# Patient Record
Sex: Female | Born: 1940 | Race: White | Hispanic: No | Smoking: Never smoker
Health system: Southern US, Community
[De-identification: ages and names within clinical notes are randomized; demographics above are authoritative.]

## PROBLEM LIST (undated history)

## (undated) DIAGNOSIS — M549 Dorsalgia, unspecified: Secondary | ICD-10-CM

## (undated) DIAGNOSIS — J302 Other seasonal allergic rhinitis: Secondary | ICD-10-CM

## (undated) DIAGNOSIS — R110 Nausea: Secondary | ICD-10-CM

## (undated) DIAGNOSIS — J189 Pneumonia, unspecified organism: Secondary | ICD-10-CM

## (undated) DIAGNOSIS — R0602 Shortness of breath: Secondary | ICD-10-CM

## (undated) DIAGNOSIS — F32A Depression, unspecified: Secondary | ICD-10-CM

## (undated) DIAGNOSIS — H269 Unspecified cataract: Secondary | ICD-10-CM

## (undated) DIAGNOSIS — M62838 Other muscle spasm: Secondary | ICD-10-CM

## (undated) DIAGNOSIS — I6529 Occlusion and stenosis of unspecified carotid artery: Secondary | ICD-10-CM

## (undated) DIAGNOSIS — R42 Dizziness and giddiness: Secondary | ICD-10-CM

## (undated) DIAGNOSIS — I2510 Atherosclerotic heart disease of native coronary artery without angina pectoris: Secondary | ICD-10-CM

## (undated) DIAGNOSIS — R3915 Urgency of urination: Secondary | ICD-10-CM

## (undated) DIAGNOSIS — R51 Headache: Secondary | ICD-10-CM

## (undated) DIAGNOSIS — G2581 Restless legs syndrome: Secondary | ICD-10-CM

## (undated) DIAGNOSIS — K219 Gastro-esophageal reflux disease without esophagitis: Secondary | ICD-10-CM

## (undated) DIAGNOSIS — N390 Urinary tract infection, site not specified: Secondary | ICD-10-CM

## (undated) DIAGNOSIS — E119 Type 2 diabetes mellitus without complications: Secondary | ICD-10-CM

## (undated) DIAGNOSIS — F329 Major depressive disorder, single episode, unspecified: Secondary | ICD-10-CM

## (undated) DIAGNOSIS — I639 Cerebral infarction, unspecified: Secondary | ICD-10-CM

## (undated) DIAGNOSIS — N179 Acute kidney failure, unspecified: Secondary | ICD-10-CM

## (undated) DIAGNOSIS — I739 Peripheral vascular disease, unspecified: Secondary | ICD-10-CM

## (undated) DIAGNOSIS — I6521 Occlusion and stenosis of right carotid artery: Secondary | ICD-10-CM

## (undated) DIAGNOSIS — I10 Essential (primary) hypertension: Secondary | ICD-10-CM

## (undated) DIAGNOSIS — Z48812 Encounter for surgical aftercare following surgery on the circulatory system: Secondary | ICD-10-CM

## (undated) DIAGNOSIS — F419 Anxiety disorder, unspecified: Secondary | ICD-10-CM

## (undated) DIAGNOSIS — E785 Hyperlipidemia, unspecified: Secondary | ICD-10-CM

## (undated) DIAGNOSIS — E782 Mixed hyperlipidemia: Secondary | ICD-10-CM

## (undated) HISTORY — DX: Urinary tract infection, site not specified: N39.0

## (undated) HISTORY — DX: Atherosclerotic heart disease of native coronary artery without angina pectoris: I25.10

## (undated) HISTORY — DX: Occlusion and stenosis of unspecified carotid artery: I65.29

## (undated) HISTORY — DX: Essential (primary) hypertension: I10

## (undated) HISTORY — PX: TRIGGER FINGER RELEASE: SHX641

## (undated) HISTORY — DX: Occlusion and stenosis of right carotid artery: I65.21

## (undated) HISTORY — DX: Encounter for surgical aftercare following surgery on the circulatory system: Z48.812

## (undated) HISTORY — DX: Acute kidney failure, unspecified: N17.9

## (undated) HISTORY — PX: TONSILLECTOMY: SUR1361

## (undated) HISTORY — PX: CORNEAL TRANSPLANT: SHX108

## (undated) HISTORY — DX: Type 2 diabetes mellitus without complications: E11.9

## (undated) HISTORY — DX: Mixed hyperlipidemia: E78.2

## (undated) HISTORY — DX: Peripheral vascular disease, unspecified: I73.9

## (undated) NOTE — *Deleted (*Deleted)
HEMATOLOGY/ONCOLOGY CONSULTATION NOTE  Date of Service: 10/20/2019  Patient Care Team: Henrine Screws, MD as PCP - General (Family Medicine) Corky Crafts, MD as Consulting Physician (Cardiology) Elinor Parkinson, [place] as Consulting Physician (Podiatry) Maeola Harman, MD as Consulting Physician (Neurosurgery) Mckinley Jewel, MD as Consulting Physician (Ophthalmology) Pa, [facility] (Family Medicine)  CHIEF COMPLAINTS/PURPOSE OF CONSULTATION:  Leukopenia, Neutropenia, Mild Thrombocytopenia  HISTORY OF PRESENTING ILLNESS:   Andrea Kirk is a wonderful 71 y.o. female who has been referred to [place] by Dr. Abigail Kirk for evaluation and management of leukopenia, neutropenia, and mild thrombocytopenia. The pt reports that she is doing well overall.   The pt reports that she had a fall on Memorial Day. Pt sustained several injuries from the fall. She has had increased fatigue and sluggishness since then. Pt has been taking Hydrocodone as prescribed and Tylenol as needed for pain management. Pt is also on Lasix.   Since May pt has been taken of off Metformin and Atorvostatin and placed on Rosuvastatin. She denies any recent infections. Pt takes Vitamin B12 and Folic acid daily. Pt has received her COVID19 and flu vaccines.  Most recent lab results (09/15/2019) of CBC w/diff is as follows: all values are WNL except for WBC at 3.3K, RBC at 3.83, MCV at 113.1, MCH at 37.8, PLT at 143K, Neutro Abs at 1.5K. 09/15/2019 Vitamin B12 at 1161, Folate at 6.3 08/31/2019 WBC at 2.4K, RBC at 3.10, HCT at 36.2, MCV at 116.8, MCH at 39.5, Mono Rel at 14.8, Neutro Abs at 1.3K, Lymphs Abs at 0.70K, Glucose at 149, BUN at 31, CO2 at 34  On review of systems, pt reports fatigue and denies bone pain, fevers, chills, rash, unexpected weight loss, sleeplessness, low appetite and any other symptoms.   On PMHx the pt reports Dizziness, HTN, HLD, Right Cornea Transplant. On Social Hx the pt  reports that she is a non-smoker and does not drink any alcohol currently. There is no concern for heavy alcohol use previously.   INTERVAL HISTORY: I connected with  Andrea Kirk on 10/20/19 by telephone and verified that I am speaking with the correct person using two identifiers.   I discussed the limitations of evaluation and management by telemedicine. The patient expressed understanding and agreed to proceed.  Other persons participating in the visit and their role in the encounter: ***  Patient's location: Home Provider's location: [facility] [employer] Andrea Kirk is a wonderful 67 y.o. female who is here for evaluation and management of leukopenia, neutropenia, and mild thrombocytopenia. The patient's last visit with [place] was on 10/05/2019. The pt reports that she is doing well overall.  The pt reports ***  Lab results (10/05/2019) of CBC w/diff and CMP is as follows: all values are WNL except for RBC at 3.82, MCV at 110.7, MCH at 37.2, Glucose at 162. 10/05/2019 LDH at 246 10/05/2019 Copper at 89 10/05/2019 Vitamin B12 at 1126 10/05/2019 Methylmalonic acid at 144 10/05/2019 K/Andrea light chains is as follows: Kappa free light chain at 22.5, Lamda free light chains at 18.6, K/Andrea light chain ratio at 1.21. 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  On review of systems, pt reports *** and denies *** and any other symptoms.   A&P: -Discussed pt labwork, 10/05/2019; leukopenia, neutropenia, and  thrombocytopenia have resolved, no anemia, blood chemistries are nml, LDH is borderline elevated, B12 is good, MMP shows Polyclonal gammopathy; Copper, Methylmalonic acid, K/Andrea light chain ratio & Reticulocytes are WNL. -***  MEDICAL HISTORY:  Past Medical History:  Diagnosis Date  . Acute kidney injury (HCC) 08/05/2016  . Aftercare  following surgery of the circulatory system, NEC 05/11/2013  . AKI (acute kidney injury) (HCC) 08/04/2016  . Anxiety    takes Celexa daily  . ARF (acute renal failure) (HCC) 11/07/2016  . Asymptomatic stenosis of right carotid artery 03/22/2016  . Back pain    occasionally  . Carotid artery disease (HCC) 04/16/2013  . Carotid artery occlusion   . Carotid stenosis 11/16/2013  . Cataract    left and immature  . Coronary artery disease   . Coronary atherosclerosis of native coronary artery 03/11/2013   S/p CABG in 1997   . Depression   . Diabetes mellitus    takes Metformin and Glipizide daily  . Diabetes mellitus (HCC) 05/02/2015  . Dizziness    takes Meclizine daily as needed  . Essential hypertension, benign 03/11/2013  . GERD (gastroesophageal reflux disease)    takes Omeprazole daily as needed  . Headache(784.0)   . Hyperlipidemia    takes Atorvastatin daily  . Hypertension    takes Carvedilol daily  . Mixed hyperlipidemia 03/11/2013  . Muscle spasm    takes Robaxin daily as needed  . Nausea    takes Phenergan daily as needed  . Occlusion and stenosis of carotid artery without mention of cerebral infarction 07/19/2011  . Pneumonia    hx of-in high school  . Restless leg    takes Requip daily as needed  . Seasonal allergies    takes Claritin daily as needed and Afrin as needed  . Shortness of breath    with exertion  . Urinary urgency   . UTI (urinary tract infection) 11/07/2016    SURGICAL HISTORY: Past Surgical History:  Procedure Laterality Date  . COLONOSCOPY WITH PROPOFOL N/A 03/10/2012   Procedure: COLONOSCOPY WITH PROPOFOL;  Surgeon: Charolett Bumpers, MD;  Location: [facility];  Service: Endoscopy;  Laterality: N/A;  . CORNEAL TRANSPLANT Right   . CORONARY ARTERY BYPASS GRAFT  1997   x 6  . CORONARY ARTERY BYPASS GRAFT  Jan. 1997  . ENDARTERECTOMY Left 04/16/2013   Procedure: Left Carotid Artery Endatarectomy with Resection of Redundant Internal Carotid Artery;   Surgeon: Larina Earthly, MD;  Location: [facility] OR;  Service: Vascular;  Laterality: Left;  . ENDARTERECTOMY Right 03/22/2016   Procedure: RIGHT ENDARTERECTOMY CAROTID;  Surgeon: Larina Earthly, MD;  Location: [facility] OR;  Service: Vascular;  Laterality: Right;  . ESOPHAGOGASTRODUODENOSCOPY N/A 03/10/2012   Procedure: ESOPHAGOGASTRODUODENOSCOPY (EGD);  Surgeon: Charolett Bumpers, MD;  Location: [facility];  Service: Endoscopy;  Laterality: N/A;  . EYE SURGERY  March 12, 2001   CORNEA TRANSPLANT Right eye  . PATCH ANGIOPLASTY Right 03/22/2016   Procedure: PATCH ANGIOPLASTY;  Surgeon: Larina Earthly, MD;  Location: [facility] OR;  Service: Vascular;  Laterality: Right;  . PR VEIN BYPASS GRAFT,AORTO-FEM-POP  1997  . SPINE SURGERY  march 2013   Back surgery  . TONSILLECTOMY    . TRIGGER FINGER RELEASE Left    thumb    SOCIAL HISTORY: Social History   Socioeconomic History  . Marital status: Widowed    Spouse name: Not on file  . Number of children: Not on file  . Years of education: Not on  file  . Highest education level: Not on file  Occupational History  . Not on file  Tobacco Use  . Smoking status: Never Smoker  . Smokeless tobacco: Never Used  Vaping Use  . Vaping Use: Never used  Substance and Sexual Activity  . Alcohol use: No    Alcohol/week: 0.0 standard drinks  . Drug use: No  . Sexual activity: Never    Birth control/protection: Post-menopausal  Other Topics Concern  . Not on file  Social History Narrative  . Not on file   Social Determinants of Health   Financial Resource Strain:   . Difficulty of Paying Living Expenses: Not on file  Food Insecurity:   . Worried About Programme researcher, broadcasting/film/video in the Last Year: Not on file  . Ran Out of Food in the Last Year: Not on file  Transportation Needs:   . Lack of Transportation (Medical): Not on file  . Lack of Transportation (Non-Medical): Not on file  Physical Activity:   . Days of Exercise per Week: Not on file  . Minutes of Exercise per  Session: Not on file  Stress:   . Feeling of Stress : Not on file  Social Connections:   . Frequency of Communication with Friends and Family: Not on file  . Frequency of Social Gatherings with Friends and Family: Not on file  . Attends Religious Services: Not on file  . Active Member of Clubs or Organizations: Not on file  . Attends Banker Meetings: Not on file  . Marital Status: Not on file  Intimate Partner Violence:   . Fear of Current or Ex-Partner: Not on file  . Emotionally Abused: Not on file  . Physically Abused: Not on file  . Sexually Abused: Not on file    FAMILY HISTORY: Family History  Problem Relation Age of Onset  . Heart attack Father   . Heart disease Father 26       Before age of 42  . Hyperlipidemia Father   . Heart disease Brother        Heart dissease before age 68  . Hyperlipidemia Brother   . Cirrhosis Mother   . Heart attack Paternal Grandfather   . Heart disease Paternal Grandfather   . Cancer Maternal Grandmother   . Alcoholism Maternal Grandfather   . Heart attack Paternal Grandmother     ALLERGIES:  is allergic to codeine, latex, morphine and related, cyclobenzaprine, and methocarbamol.  MEDICATIONS:  Current Outpatient Medications  Medication Sig Dispense Refill  . acetaminophen (TYLENOL) 325 MG tablet Take 650 mg by mouth every 6 (six) hours as needed for moderate pain.    Marland Kitchen amLODipine (NORVASC) 5 MG tablet Take 1 tablet (5 mg total) by mouth daily. 30 tablet 0  . Ascorbic Acid (VITAMIN C) 1000 MG tablet Take 1,000 mg by mouth daily.    Marland Kitchen aspirin EC 81 MG tablet Take 81 mg by mouth every evening.     . Calcium Carbonate-Vitamin D (CALTRATE 600+D PO) Take 1 tablet by mouth daily.    . carvedilol (COREG) 25 MG tablet Take 1 tablet (25 mg total) by mouth 2 (two) times daily. 180 tablet 3  . cholecalciferol (VITAMIN D3) 25 MCG (1000 UT) tablet Take 1,000 Units by mouth daily.    . citalopram (CELEXA) 20 MG tablet Take 20 mg by  mouth every morning.     . clopidogrel (PLAVIX) 75 MG tablet Take 75 mg by mouth every morning.     Marland Kitchen  Cyanocobalamin (B-12) 2500 MCG TABS Take 2,500 mcg by mouth daily.    Marland Kitchen FERROUS SULFATE PO Take 325 mg by mouth daily.    . fish oil-omega-3 fatty acids 1000 MG capsule Take 1 g by mouth daily.    . furosemide (LASIX) 20 MG tablet Take 20 mg by mouth daily as needed for fluid or edema.     . gabapentin (NEURONTIN) 300 MG capsule Take 300 mg by mouth 4 (four) times daily.     Marland Kitchen glipiZIDE (GLUCOTROL XL) 10 MG 24 hr tablet Take 10 mg by mouth daily with breakfast.     . HYDROcodone-acetaminophen (NORCO) 10-325 MG tablet Take 1 tablet by mouth 2 (two) times daily as needed for severe pain.     Marland Kitchen ibuprofen (ADVIL,MOTRIN) 400 MG tablet Take 400 mg by mouth every 6 (six) hours as needed.    . Magnesium Oxide 250 MG TABS Take 1 tablet by mouth daily.    . meclizine (ANTIVERT) 25 MG tablet Take 25 mg by mouth 2 (two) times daily as needed for dizziness. For dizziness     . metFORMIN (GLUCOPHAGE) 1000 MG tablet Take 1,000 mg by mouth 2 (two) times daily with a meal.  (Patient not taking: Reported on 10/05/2019)    . methocarbamol (ROBAXIN) 500 MG tablet Take 1 tablet (500 mg total) by mouth every 8 (eight) hours as needed for muscle spasms. 30 tablet 0  . nitroGLYCERIN (NITROSTAT) 0.4 MG SL tablet Place 1 tablet (0.4 mg total) under the tongue every 5 (five) minutes as needed. Chest pain. 25 tablet 5  . ofloxacin (OCUFLOX) 0.3 % ophthalmic solution Place 1 drop into the left eye 4 (four) times daily. (Patient not taking: Reported on 09/27/2019) 5 mL 0  . pantoprazole (PROTONIX) 40 MG tablet Take 40 mg by mouth daily as needed (INDIGESTION).     Marland Kitchen prednisoLONE acetate (PRED FORTE) 1 % ophthalmic suspension Place 1 drop into the left eye 4 (four) times daily. (Patient not taking: Reported on 09/27/2019) 5 mL 0  . promethazine (PHENERGAN) 12.5 MG tablet Take 12.5 mg by mouth every 6 (six) hours as needed for  nausea or vomiting.    . rosuvastatin (CRESTOR) 5 MG tablet Take 5 mg by mouth daily. Takes on Monday and Friday     No current facility-administered medications for this visit.    REVIEW OF SYSTEMS:   A 10+ POINT REVIEW OF SYSTEMS WAS OBTAINED including neurology, dermatology, psychiatry, cardiac, respiratory, lymph, extremities, GI, GU, Musculoskeletal, constitutional, breasts, reproductive, HEENT.  All pertinent positives are noted in the HPI.  All others are negative.   PHYSICAL EXAMINATION: ECOG PERFORMANCE STATUS: 2 - Symptomatic, <50% confined to bed  . There were no vitals filed for this visit. There were no vitals filed for this visit. .There is no height or weight on file to calculate BMI.  Telehealth visit  LABORATORY DATA:  I have reviewed the data as listed  . CBC Latest Ref Rng & Units 10/05/2019 12/22/2017 12/21/2017  WBC 4.0 - 10.5 K/uL 4.1 6.0 -  Hemoglobin 12.0 - 15.0 g/dL 81.1 11.2(Andrea) 13.6  Hematocrit 36 - 46 % 42.3 35.0(Andrea) 40.0  Platelets 150 - 400 K/uL 172 161 -   . CBC    Component Value Date/Time   WBC 4.1 10/05/2019 1427   RBC 3.82 (Andrea) 10/05/2019 1427   RBC 3.89 10/05/2019 1427   HGB 14.2 10/05/2019 1427   HCT 42.3 10/05/2019 1427   HCT 35.3 11/08/2016 0516  PLT 172 10/05/2019 1427   MCV 110.7 (H) 10/05/2019 1427   MCH 37.2 (H) 10/05/2019 1427   MCHC 33.6 10/05/2019 1427   RDW 12.6 10/05/2019 1427   LYMPHSABS 1.0 10/05/2019 1427   MONOABS 0.3 10/05/2019 1427   EOSABS 0.1 10/05/2019 1427   BASOSABS 0.0 10/05/2019 1427     . CMP Latest Ref Rng & Units 10/05/2019 12/22/2017 12/21/2017  Glucose 70 - 99 mg/dL 960(A) 540(J) 811(B)  BUN 8 - 23 mg/dL 20 14(N) 82(N)  Creatinine 0.44 - 1.00 mg/dL 5.62 1.30 8.65(H)  Sodium 135 - 145 mmol/Andrea 138 140 136  Potassium 3.5 - 5.1 mmol/Andrea 4.4 4.8 4.9  Chloride 98 - 111 mmol/Andrea 101 101 99  CO2 22 - 32 mmol/Andrea 30 26 -  Calcium 8.9 - 10.3 mg/dL 9.9 9.3 -  Total Protein 6.5 - 8.1 g/dL 7.6 - -  Total Bilirubin  0.3 - 1.2 mg/dL 0.7 - -  Alkaline Phos 38 - 126 U/Andrea 80 - -  AST 15 - 41 U/Andrea 25 - -  ALT 0 - 44 U/Andrea 19 - -     RADIOGRAPHIC STUDIES: I have personally reviewed the radiological images as listed and agreed with the findings in the report. OCT, Retina - OU - Both Eyes  Result Date: 09/27/2019 Right Eye Quality was good. Scan locations included subfoveal. Central Foveal Thickness: 215. Findings include normal foveal contour. Left Eye Quality was good. Scan locations included subfoveal. Central Foveal Thickness: 198. Findings include normal foveal contour. 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   ASSESSMENT & PLAN:   34 yo with h/o multiple medical co-morbidities referred for   1) Leukopenia and thrombocytopenia 2) RBC Macrocytosis PLAN: *** -Recommend pt continue Vitamin B12 and Folic acid daily    FOLLOW UP: ***   The total time spent in the appt was *** minutes and more than 50% was on counseling and direct patient cares.  All of the patient's questions were answered with apparent satisfaction. The patient knows to call the clinic with any problems, questions or concerns.    Wyvonnia Lora MD MS AAHIVMS [facility] Hematology/Oncology Physician [facility]  (Office):       [phone] (Work cell):  [phone] (Fax):           [phone]  10/20/2019 10:47 AM  I, Carollee Herter, am acting as a scribe for Dr. Wyvonnia Lora.   {Add Production assistant, radio Statement}

---

## 1995-01-08 HISTORY — PX: CORONARY ARTERY BYPASS GRAFT: SHX141

## 1995-01-08 HISTORY — PX: PR VEIN BYPASS GRAFT,AORTO-FEM-POP: 35551

## 1997-08-03 ENCOUNTER — Ambulatory Visit (HOSPITAL_COMMUNITY): Admission: RE | Admit: 1997-08-03 | Discharge: 1997-08-03 | Payer: Self-pay | Admitting: Gastroenterology

## 1998-09-12 ENCOUNTER — Encounter: Admission: RE | Admit: 1998-09-12 | Discharge: 1998-12-11 | Payer: Self-pay | Admitting: Internal Medicine

## 1999-01-02 ENCOUNTER — Encounter: Admission: RE | Admit: 1999-01-02 | Discharge: 1999-01-02 | Payer: Self-pay | Admitting: Internal Medicine

## 1999-01-02 ENCOUNTER — Encounter: Payer: Self-pay | Admitting: Internal Medicine

## 1999-01-24 ENCOUNTER — Encounter: Admission: RE | Admit: 1999-01-24 | Discharge: 1999-04-24 | Payer: Self-pay | Admitting: Internal Medicine

## 1999-08-08 ENCOUNTER — Ambulatory Visit (HOSPITAL_COMMUNITY): Admission: RE | Admit: 1999-08-08 | Discharge: 1999-08-08 | Payer: Self-pay | Admitting: Cardiology

## 2000-01-03 ENCOUNTER — Encounter: Admission: RE | Admit: 2000-01-03 | Discharge: 2000-01-03 | Payer: Self-pay | Admitting: Internal Medicine

## 2000-01-03 ENCOUNTER — Encounter: Payer: Self-pay | Admitting: Internal Medicine

## 2000-03-17 ENCOUNTER — Encounter: Payer: Self-pay | Admitting: Orthopedic Surgery

## 2000-03-17 ENCOUNTER — Ambulatory Visit (HOSPITAL_COMMUNITY): Admission: RE | Admit: 2000-03-17 | Discharge: 2000-03-17 | Payer: Self-pay | Admitting: Orthopedic Surgery

## 2000-04-02 ENCOUNTER — Encounter: Admission: RE | Admit: 2000-04-02 | Discharge: 2000-04-02 | Payer: Self-pay | Admitting: Orthopedic Surgery

## 2000-04-02 ENCOUNTER — Encounter: Payer: Self-pay | Admitting: Orthopedic Surgery

## 2000-04-03 ENCOUNTER — Encounter: Admission: RE | Admit: 2000-04-03 | Discharge: 2000-04-03 | Payer: Self-pay | Admitting: Orthopedic Surgery

## 2000-04-03 ENCOUNTER — Encounter: Payer: Self-pay | Admitting: Orthopedic Surgery

## 2000-07-24 ENCOUNTER — Ambulatory Visit (HOSPITAL_COMMUNITY): Admission: RE | Admit: 2000-07-24 | Discharge: 2000-07-24 | Payer: Self-pay | Admitting: Cardiology

## 2001-01-05 ENCOUNTER — Encounter: Admission: RE | Admit: 2001-01-05 | Discharge: 2001-01-05 | Payer: Self-pay | Admitting: Internal Medicine

## 2001-01-05 ENCOUNTER — Encounter: Payer: Self-pay | Admitting: Internal Medicine

## 2001-03-12 HISTORY — PX: EYE SURGERY: SHX253

## 2001-08-13 ENCOUNTER — Ambulatory Visit (HOSPITAL_COMMUNITY): Admission: RE | Admit: 2001-08-13 | Discharge: 2001-08-13 | Payer: Self-pay | Admitting: Cardiology

## 2001-10-29 ENCOUNTER — Encounter: Admission: RE | Admit: 2001-10-29 | Discharge: 2002-01-27 | Payer: Self-pay | Admitting: Internal Medicine

## 2002-01-06 ENCOUNTER — Encounter: Payer: Self-pay | Admitting: Internal Medicine

## 2002-01-06 ENCOUNTER — Encounter: Admission: RE | Admit: 2002-01-06 | Discharge: 2002-01-06 | Payer: Self-pay | Admitting: Internal Medicine

## 2002-08-05 ENCOUNTER — Ambulatory Visit (HOSPITAL_COMMUNITY): Admission: RE | Admit: 2002-08-05 | Discharge: 2002-08-05 | Payer: Self-pay | Admitting: Gastroenterology

## 2003-01-13 ENCOUNTER — Encounter: Admission: RE | Admit: 2003-01-13 | Discharge: 2003-01-13 | Payer: Self-pay | Admitting: Cardiology

## 2004-02-02 ENCOUNTER — Encounter: Admission: RE | Admit: 2004-02-02 | Discharge: 2004-02-02 | Payer: Self-pay | Admitting: Internal Medicine

## 2005-01-20 ENCOUNTER — Emergency Department (HOSPITAL_COMMUNITY): Admission: EM | Admit: 2005-01-20 | Discharge: 2005-01-20 | Payer: Self-pay | Admitting: Emergency Medicine

## 2005-02-04 ENCOUNTER — Encounter: Admission: RE | Admit: 2005-02-04 | Discharge: 2005-02-04 | Payer: Self-pay | Admitting: Cardiology

## 2006-06-14 ENCOUNTER — Emergency Department (HOSPITAL_COMMUNITY): Admission: EM | Admit: 2006-06-14 | Discharge: 2006-06-14 | Payer: Self-pay | Admitting: *Deleted

## 2006-12-17 ENCOUNTER — Ambulatory Visit: Payer: Self-pay | Admitting: Vascular Surgery

## 2007-06-02 ENCOUNTER — Encounter (INDEPENDENT_AMBULATORY_CARE_PROVIDER_SITE_OTHER): Payer: Self-pay | Admitting: Family Medicine

## 2007-06-17 ENCOUNTER — Ambulatory Visit: Payer: Self-pay | Admitting: Vascular Surgery

## 2007-10-22 ENCOUNTER — Encounter: Admission: RE | Admit: 2007-10-22 | Discharge: 2007-10-22 | Payer: Self-pay | Admitting: Family Medicine

## 2007-12-18 ENCOUNTER — Ambulatory Visit: Payer: Self-pay | Admitting: Vascular Surgery

## 2008-06-17 ENCOUNTER — Ambulatory Visit: Payer: Self-pay | Admitting: Vascular Surgery

## 2008-12-15 ENCOUNTER — Ambulatory Visit: Payer: Self-pay | Admitting: Vascular Surgery

## 2008-12-27 ENCOUNTER — Encounter: Admission: RE | Admit: 2008-12-27 | Discharge: 2008-12-27 | Payer: Self-pay | Admitting: Orthopedic Surgery

## 2009-06-10 ENCOUNTER — Emergency Department (HOSPITAL_COMMUNITY): Admission: EM | Admit: 2009-06-10 | Discharge: 2009-06-10 | Payer: Self-pay | Admitting: *Deleted

## 2009-11-07 ENCOUNTER — Ambulatory Visit: Payer: Self-pay | Admitting: Vascular Surgery

## 2010-01-28 ENCOUNTER — Encounter: Payer: Self-pay | Admitting: Cardiology

## 2010-05-08 ENCOUNTER — Other Ambulatory Visit: Payer: Self-pay

## 2010-05-18 ENCOUNTER — Other Ambulatory Visit (INDEPENDENT_AMBULATORY_CARE_PROVIDER_SITE_OTHER): Payer: Medicare Other

## 2010-05-18 DIAGNOSIS — I6529 Occlusion and stenosis of unspecified carotid artery: Secondary | ICD-10-CM

## 2010-05-22 NOTE — Procedures (Signed)
CAROTID DUPLEX EXAM   INDICATION:  Followup of carotid artery disease.   HISTORY:  Diabetes:  Yes.  Cardiac:  CABG.  Hypertension:  Yes.  Smoking:  No.  Previous Surgery:  No.  CV History:  Asymptomatic.  Amaurosis Fugax No, Paresthesias No, Hemiparesis No                                       RIGHT             LEFT  Brachial systolic pressure:         190               170  Brachial Doppler waveforms:         Triphasic         Triphasic  Vertebral direction of flow:        Antegrade         Antegrade/atypical  DUPLEX VELOCITIES (cm/sec)  CCA peak systolic                   47                47  ECA peak systolic                   93                172  ICA peak systolic                   188               251  ICA end diastolic                   36                52  PLAQUE MORPHOLOGY:                  Heterogeneous     Heterogeneous  PLAQUE AMOUNT:                      Moderate          Moderate to severe  PLAQUE LOCATION:                    Bifurcation, ICA, ECA               Bifurcation, ICA, ECA   IMPRESSION:  1. 40%-59% stenosis noted in the right internal carotid artery.  2. 60%-79% stenosis noted in the left internal carotid artery.  3. Left vertebral flow is antegrade and early systolic deceleration.   ___________________________________________  Larina Earthly, M.D.   CJ/MEDQ  D:  12/15/2008  T:  12/15/2008  Job:  [id number]   cc:   Dr Corliss Marcus

## 2010-05-22 NOTE — Procedures (Signed)
CAROTID DUPLEX EXAM   INDICATION:  Carotid artery disease.   HISTORY:  Diabetes:  Yes.  Cardiac:  CABG.  Hypertension:  Yes.  Smoking:  No.  Previous Surgery:  No.  CV History:  Asymptomatic.  Amaurosis Fugax No, Paresthesias No, Hemiparesis No.                                       RIGHT             LEFT  Brachial systolic pressure:         142               138  Brachial Doppler waveforms:         Normal            Normal  Vertebral direction of flow:        Antegrade         Antegrade/atypical  DUPLEX VELOCITIES (cm/sec)  CCA peak systolic                   64                53  ECA peak systolic                   143               289  ICA peak systolic                   158               213  ICA end diastolic                   41                56  PLAQUE MORPHOLOGY:                  Mixed             Mixed  PLAQUE AMOUNT:                      Moderate          Moderate  PLAQUE LOCATION:                    ICA/ECA/CCA       ICA/ECA/CCA   IMPRESSION:  1. 40-59% stenosis of the right internal carotid artery.  2. Doppler velocities suggest the low-end 60-79% stenosis of the left      internal carotid artery; however, velocities may be under-estimated      due to calcific shadowing.  3. The flow within the antegrade left vertebral artery demonstrates      early systolic deceleration.  4. Doppler velocities of the right internal carotid artery are less      than previously recorded when compared to the previous examination      on 12/18/07 with the left side remaining stable.   ___________________________________________  Larina Earthly, M.D.   CH/MEDQ  D:  06/17/2008  T:  06/17/2008  Job:  [id number]

## 2010-05-22 NOTE — Assessment & Plan Note (Signed)
OFFICE VISIT   Andrea Kirk, Andrea Kirk  DOB:  12/07/1940                                       11/07/2009  MWUXL#:[id number]   The patient presents today for followup of her known moderate to severe  asymptomatic carotid disease.  She has been followed at our office since  2007 with known moderate to severe stenosis.  She has had serial  ultrasound studies over this time period showing slight progression of  disease.  She denies any focal neurologic deficits.  No amaurosis fugax,  transient ischemic attack or stroke.  Does have history of coronary  artery disease with coronary artery bypass grafting in 1997.  She does  have a history of hypertension.  She does not smoke currently and does  not have any unstable cardiac disease.  She is a known insulin-dependent  diabetic.   SOCIAL HISTORY:  She is retired.  Married.  She does not smoke or drink  alcohol.   FAMILY HISTORY:  Does have a history of coronary artery bypass grafting  in her brother and also myocardial infarction in her father at age 37.   REVIEW OF SYSTEMS:  No weight loss or gain.  Weight is 179 pounds.  She  is 5 feet 5 inches tall.  CARDIAC:  Positive for shortness of breath on exertion.  PSYCHIATRIC:  Positive for anxiety and depression.   Review of systems otherwise negative except for HPI.   PHYSICAL EXAMINATION:  General:  Well-developed, well-nourished white  female appearing stated age in no acute distress.  Vital signs:  Blood  pressure is 156/80, pulse 57, respirations 20.  HEENT:  Normal.  Chest:  Clear bilaterally.  Heart:  Regular rate and rhythm.  She has 2+ radial  pulses bilaterally.  Abdomen:  Soft, nontender.  No masses noted.  Musculoskeletal:  Shows no major or cyanosis.  Neurologic:  No focal  weakness or paresthesias.  Skin:  Without ulcers or rashes.  No carotid  bruits.   She underwent repeat carotid duplex in our office today and this reveals  no change in her moderate to  severe 60%-79% stenosis.  I discussed this  at length with the patient and explained that she has fortunately had  very slow progression over time with no new neurologic deficits.  She  will notify us immediately should she have some neurologic deficit,  otherwise will see [place] again at 41-month intervals for carotid duplex  followup.     Larina Earthly, M.D.  Electronically Signed   TFE/MEDQ  D:  11/07/2009  T:  11/08/2009  Job:  [id number]   cc:   Dr. Claude Manges  Dr. Corliss Marcus

## 2010-05-22 NOTE — Procedures (Signed)
CAROTID DUPLEX EXAM   INDICATION:  Followup of known carotid artery disease.  The patient is  currently asymptomatic.   HISTORY:  Diabetes:  Yes.  Cardiac:  CABG x6.  Hypertension:  Yes.  Smoking:  No.  Previous Surgery:  Cardiac.  CV History:  No.  Amaurosis Fugax No, Paresthesias No, Hemiparesis No                                       RIGHT             LEFT  Brachial systolic pressure:         118               118  Brachial Doppler waveforms:         WNL               WNL  Vertebral direction of flow:        Antegrade         Antegrade  (atypical)  DUPLEX VELOCITIES (cm/sec)  CCA peak systolic                   79                84  ECA peak systolic                   114               309  ICA peak systolic                   222               262  ICA end diastolic                   49                81  PLAQUE MORPHOLOGY:                  Heterogenous      Mixed, calcific  with shadowing  PLAQUE AMOUNT:                      Moderate          Moderate - severe  PLAQUE LOCATION:                    ICA               ICA, ECA   IMPRESSION:  1. Bilateral 60-79% ICA stenoses, left > right.  2. Left ECA stenosis.  3. Bilateral antegrade flow in vertebral arteries, however, the left      vertebral artery demonstrates early systolic deceleration      suggestive of subclavian stenosis.   ___________________________________________  Larina Earthly, M.D.   PB/MEDQ  D:  06/17/2007  T:  06/17/2007  Job:  [id number]   cc:   Francisca December, M.D.

## 2010-05-22 NOTE — Procedures (Signed)
CAROTID DUPLEX EXAM   INDICATION:  Followup known carotid artery disease.   HISTORY:  Diabetes:  Yes.  Cardiac:  CABG x6.  Hypertension:  Yes.  Smoking:  No.  Previous Surgery:  No.  CV History:  No.  Amaurosis Fugax No, Paresthesias No, Hemiparesis No                                       RIGHT             LEFT  Brachial systolic pressure:         180               164  Brachial Doppler waveforms:         Biphasic          Biphasic  Vertebral direction of flow:        Antegrade         Antegrade  DUPLEX VELOCITIES (cm/sec)  CCA peak systolic                   76                80  ECA peak systolic                   137               212  ICA peak systolic                   235               249  ICA end diastolic                   34                62  PLAQUE MORPHOLOGY:                  Calcified         Calcified  PLAQUE AMOUNT:                      Moderate          Moderate  PLAQUE LOCATION:                    ICA and ECA       ICA and ECA   IMPRESSION:  1. 60-79% stenosis noted in bilateral ICA.  2. Antegrade bilateral vertebral arteries.       ___________________________________________  Larina Earthly, M.D.   MG/MEDQ  D:  12/18/2007  T:  12/18/2007  Job:  [id number]   cc:   Francisca December, M.D.

## 2010-05-22 NOTE — Procedures (Signed)
CAROTID DUPLEX EXAM   INDICATION:  Follow up carotid artery disease.   HISTORY:  Diabetes:  Yes.  Cardiac:  CABG.  Hypertension:  Yes.  Smoking:  No.  Previous Surgery:  No.  CV History:  Asymptomatic.  Amaurosis Fugax , Paresthesias , Hemiparesis                                       RIGHT             LEFT  Brachial systolic pressure:  Brachial Doppler waveforms:  Vertebral direction of flow:        Antegrade         Antegrade,  atypical  DUPLEX VELOCITIES (cm/sec)  CCA peak systolic                   87                70  ECA peak systolic                   105               276  ICA peak systolic                   258               218  ICA end diastolic                   67                62  PLAQUE MORPHOLOGY:                  Heterogenous      Calcific  PLAQUE AMOUNT:                      Moderate          Moderate  PLAQUE LOCATION:                    Bifurcation, ICA, ECA               Bifurcation, ICA, ECA   IMPRESSION:  1. Bilateral internal carotid artery velocities indicate 60% to 79%      stenosis.  2. Right internal carotid artery velocities have increased from      previous study.  3. Left vertebral flow is antegrade in early systolic deceleration.   ___________________________________________  Larina Earthly, M.D.   EM/MEDQ  D:  11/07/2009  T:  11/08/2009  Job:  [id number]

## 2010-05-22 NOTE — Procedures (Signed)
CAROTID DUPLEX EXAM   INDICATION:  Followup, known carotid artery disease.   HISTORY:  Diabetes:  Yes.  Cardiac:  CABG X6.  Hypertension:  Yes.  Smoking:  No.  Previous Surgery:  No.  CV History:  Amaurosis Fugax No, Paresthesias No, Hemiparesis No                                       RIGHT             LEFT  Brachial systolic pressure:         140               140  Brachial Doppler waveforms:         Biphasic          Biphasic  Vertebral direction of flow:        Antegrade         Antegrade  DUPLEX VELOCITIES (cm/sec)  CCA peak systolic                   86                76  ECA peak systolic                   153               251  ICA peak systolic                   271               251  ICA end diastolic                   63                78  PLAQUE MORPHOLOGY:                  Heterogenous      Heterogenous  PLAQUE AMOUNT:                      Moderate          Moderate  PLAQUE LOCATION:                    ICA, ECA          ICA, ECA   IMPRESSION:  1. 60-79% stenosis noted in bilateral internal carotid arteries.  2. Antegrade vertebral arteries.   ___________________________________________  Larina Earthly, M.D.   MG/MEDQ  D:  12/17/2006  T:  12/18/2006  Job:  [id number]

## 2010-05-25 NOTE — Op Note (Signed)
   NAME:  Kirk, Andrea                             ACCOUNT NO.:  [id number]   MEDICAL RECORD NO.:  [id number]                   PATIENT TYPE:  AMB   LOCATION:  ENDO                                 FACILITY:  [facility]   PHYSICIAN:  Danise Edge, M.D.                DATE OF BIRTH:  11/12/1940   DATE OF PROCEDURE:  08/05/2002  DATE OF DISCHARGE:                                 OPERATIVE REPORT   PROCEDURE:  Screening colonoscopy.   PROCEDURE INDICATIONS:  [name] is a 70 year old female born September 25, 1940.  Five years ago [name] underwent a screening colonoscopy and a  neoplastic polyp was removed.  She is scheduled for repeat screening  colonoscopy with polypectomy to prevent colon cancer.   ENDOSCOPIST:  Danise Edge, M.D.   PREMEDICATION:  Versed 10 mg, Demerol 50 mg.   DESCRIPTION OF PROCEDURE:  After obtaining informed consent, [name] was  placed in the left lateral decubitus position.  I administered intravenous  Demerol and intravenous Versed to achieve conscious sedation for the  procedure.  The patient's blood pressure, oxygen saturation, and cardiac  rhythm were monitored throughout the procedure and documented in the medical  record.   Anal inspection was normal.  Digital rectal exam was normal.  The Olympus  adjustable pediatric colonoscope was introduced into the rectum and advanced  to the cecum.  Colonic preparation for the exam today was excellent.   Rectum normal.   Sigmoid colon and descending colon normal.   Splenic flexure normal.   Transverse colon normal.   Hepatic flexure normal.   Ascending colon normal.   Cecum and ileocecal valve normal.    ASSESSMENT:  Normal screening proctocolonoscopy to the cecum.   RECOMMENDATIONS:  Repeat colonoscopy in five years.                                               Danise Edge, M.D.    MJ/MEDQ  D:  08/05/2002  T:  08/05/2002  Job:  [id number]   cc:   Darius Bump, M.D.  [place]  [place] [address]  Fax: [phone]

## 2010-05-30 NOTE — Procedures (Unsigned)
CAROTID DUPLEX EXAM  INDICATION:  Followup carotid artery disease.  HISTORY: Diabetes:  Yes. Cardiac:  CABG. Hypertension:  Yes. Smoking:  No. Previous Surgery:  No. CV History:  The patient is currently asymptomatic. Amaurosis Fugax No, Paresthesias No, Hemiparesis No                                      RIGHT             LEFT Brachial systolic pressure:         157               158 Brachial Doppler waveforms:         WNL               WNL Vertebral direction of flow:        Antegrade         Atypical antegrade DUPLEX VELOCITIES (cm/sec) CCA peak systolic                   53                56 ECA peak systolic                   94                171 ICA peak systolic                   140               203 ICA end diastolic                   48                58 PLAQUE MORPHOLOGY:                  Heterogeneous     Heterogeneous PLAQUE AMOUNT:                      Moderate          Moderate PLAQUE LOCATION:                    CCA, ICA, ECA     CCA, ICA, ECA  IMPRESSION:  Right side 40%-59% stenosis within internal carotid artery. Left side 60%-79% stenosis within internal carotid artery.  However, calcific plaque at the proximal internal carotid artery prevents accurate evaluation but waveforms and velocities just distal to acoustic shadow appear at 60%-79% stenosis.  Bilateral intimal thickening within the common carotid arteries.  Left external carotid artery stenosis. Atypical antegrade flow within the left vertebral suggestive of partial subclavian steal.  Study stable compared to previous.  ___________________________________________ Larina Earthly, M.D.  OD/MEDQ  D:  05/18/2010  T:  05/18/2010  Job:  [id number]

## 2010-12-07 ENCOUNTER — Other Ambulatory Visit: Payer: Medicare Other

## 2010-12-14 ENCOUNTER — Ambulatory Visit (INDEPENDENT_AMBULATORY_CARE_PROVIDER_SITE_OTHER): Payer: Medicare Other | Admitting: *Deleted

## 2010-12-14 DIAGNOSIS — I6529 Occlusion and stenosis of unspecified carotid artery: Secondary | ICD-10-CM

## 2010-12-19 ENCOUNTER — Other Ambulatory Visit: Payer: Self-pay

## 2010-12-19 DIAGNOSIS — I6529 Occlusion and stenosis of unspecified carotid artery: Secondary | ICD-10-CM

## 2010-12-19 NOTE — Procedures (Unsigned)
CAROTID DUPLEX EXAM  INDICATION:  Follow up carotid disease.  HISTORY: Diabetes:  Yes. Cardiac:  Yes. Hypertension:  Yes. Smoking:  No. Previous Surgery:  No. CV History: Amaurosis Fugax No, Paresthesias No, Hemiparesis No.                                      RIGHT             LEFT Brachial systolic pressure:         142               140 Brachial Doppler waveforms:         WNL               WNL Vertebral direction of flow:        Antegrade         Abnormal antegrade DUPLEX VELOCITIES (cm/sec) CCA peak systolic                   61                67 ECA peak systolic                   100               253 ICA peak systolic                   198               218 ICA end diastolic                   62                71 PLAQUE MORPHOLOGY:                  Heterogenous      Calcific PLAQUE AMOUNT:                      Moderate          Moderate to severe PLAQUE LOCATION:                    CCA/ICA           CCA/ECA/ICA  IMPRESSION: 1. Technically difficult study due to calcific shadow and tortuous     vessels. 2. 69% to 79% bilateral internal carotid artery stenosis; however, the     plaque on the left is calcific and difficult to interrogate.     Recorded velocities may be under-estimated. 3. Left external carotid artery stenosis is observed. 4. Antegrade vertebral arteries bilaterally; however, the left     displays mid systolic deceleration, which may preclude subclavian     steal. 5. Stable results compared to previous examination 6 months ago.  ___________________________________________ Larina Earthly, M.D.  LT/MEDQ  D:  12/14/2010  T:  12/14/2010  Job:  [id number]

## 2010-12-20 ENCOUNTER — Encounter: Payer: Self-pay | Admitting: Vascular Surgery

## 2011-01-09 DIAGNOSIS — I2510 Atherosclerotic heart disease of native coronary artery without angina pectoris: Secondary | ICD-10-CM | POA: Diagnosis not present

## 2011-01-09 DIAGNOSIS — E7800 Pure hypercholesterolemia, unspecified: Secondary | ICD-10-CM | POA: Diagnosis not present

## 2011-01-14 DIAGNOSIS — M25569 Pain in unspecified knee: Secondary | ICD-10-CM | POA: Diagnosis not present

## 2011-01-17 DIAGNOSIS — IMO0001 Reserved for inherently not codable concepts without codable children: Secondary | ICD-10-CM | POA: Diagnosis not present

## 2011-02-04 ENCOUNTER — Other Ambulatory Visit: Payer: Self-pay | Admitting: Neurosurgery

## 2011-02-04 DIAGNOSIS — M545 Low back pain, unspecified: Secondary | ICD-10-CM | POA: Diagnosis not present

## 2011-02-04 DIAGNOSIS — M47817 Spondylosis without myelopathy or radiculopathy, lumbosacral region: Secondary | ICD-10-CM | POA: Diagnosis not present

## 2011-02-05 DIAGNOSIS — T148XXA Other injury of unspecified body region, initial encounter: Secondary | ICD-10-CM | POA: Diagnosis not present

## 2011-02-08 ENCOUNTER — Inpatient Hospital Stay: Admission: RE | Admit: 2011-02-08 | Payer: Medicare Other | Source: Ambulatory Visit

## 2011-02-15 ENCOUNTER — Ambulatory Visit
Admission: RE | Admit: 2011-02-15 | Discharge: 2011-02-15 | Disposition: A | Discharge status: Home / Self Care | Payer: Medicare Other | Source: Ambulatory Visit | Attending: Neurosurgery | Admitting: Neurosurgery

## 2011-02-15 DIAGNOSIS — M47817 Spondylosis without myelopathy or radiculopathy, lumbosacral region: Secondary | ICD-10-CM

## 2011-02-15 DIAGNOSIS — M5126 Other intervertebral disc displacement, lumbar region: Secondary | ICD-10-CM | POA: Diagnosis not present

## 2011-02-27 ENCOUNTER — Other Ambulatory Visit: Payer: Self-pay | Admitting: Neurosurgery

## 2011-02-27 DIAGNOSIS — M5126 Other intervertebral disc displacement, lumbar region: Secondary | ICD-10-CM | POA: Diagnosis not present

## 2011-02-27 DIAGNOSIS — M4120 Other idiopathic scoliosis, site unspecified: Secondary | ICD-10-CM | POA: Diagnosis not present

## 2011-02-27 DIAGNOSIS — IMO0002 Reserved for concepts with insufficient information to code with codable children: Secondary | ICD-10-CM | POA: Diagnosis not present

## 2011-02-27 DIAGNOSIS — M48061 Spinal stenosis, lumbar region without neurogenic claudication: Secondary | ICD-10-CM | POA: Diagnosis not present

## 2011-03-08 ENCOUNTER — Encounter (HOSPITAL_COMMUNITY): Payer: Self-pay | Admitting: Pharmacy Technician

## 2011-03-08 HISTORY — PX: SPINE SURGERY: SHX786

## 2011-03-14 ENCOUNTER — Other Ambulatory Visit (HOSPITAL_COMMUNITY): Payer: Medicare Other

## 2011-03-15 ENCOUNTER — Encounter (HOSPITAL_COMMUNITY)
Admission: RE | Admit: 2011-03-15 | Discharge: 2011-03-15 | Disposition: A | Discharge status: Home / Self Care | Payer: Medicare Other | Source: Ambulatory Visit | Attending: Neurosurgery | Admitting: Neurosurgery

## 2011-03-15 ENCOUNTER — Encounter (HOSPITAL_COMMUNITY)
Admission: RE | Admit: 2011-03-15 | Discharge: 2011-03-15 | Disposition: A | Discharge status: Home / Self Care | Payer: Medicare Other | Source: Ambulatory Visit | Attending: Anesthesiology | Admitting: Anesthesiology

## 2011-03-15 ENCOUNTER — Encounter (HOSPITAL_COMMUNITY): Payer: Self-pay

## 2011-03-15 DIAGNOSIS — Z01811 Encounter for preprocedural respiratory examination: Secondary | ICD-10-CM | POA: Diagnosis not present

## 2011-03-15 HISTORY — DX: Anxiety disorder, unspecified: F41.9

## 2011-03-15 HISTORY — DX: Hyperlipidemia, unspecified: E78.5

## 2011-03-15 HISTORY — DX: Essential (primary) hypertension: I10

## 2011-03-15 LAB — BASIC METABOLIC PANEL
BUN: 20 mg/dL (ref 6–23)
CO2: 28 mEq/L (ref 19–32)
Calcium: 10.6 mg/dL — ABNORMAL HIGH (ref 8.4–10.5)
Chloride: 104 mEq/L (ref 96–112)
Creatinine, Ser: 0.67 mg/dL (ref 0.50–1.10)
GFR calc Af Amer: >90 mL/min (ref >90)
GFR calc non Af Amer: 88 mL/min — ABNORMAL LOW (ref >90)
Glucose, Bld: 170 mg/dL — ABNORMAL HIGH (ref 70–99)
Potassium: 4.7 mEq/L (ref 3.5–5.1)
Sodium: 142 mEq/L (ref 135–145)

## 2011-03-15 LAB — CBC
HCT: 38.5 % (ref 36.0–46.0)
Hemoglobin: 13.3 g/dL (ref 12.0–15.0)
MCH: 32.6 pg (ref 26.0–34.0)
MCHC: 34.5 g/dL (ref 30.0–36.0)
MCV: 94.4 fL (ref 78.0–100.0)
Platelets: 165 10*3/uL (ref 150–400)
RBC: 4.08 MIL/uL (ref 3.87–5.11)
RDW: 12.6 % (ref 11.5–15.5)
WBC: 7.3 10*3/uL (ref 4.0–10.5)

## 2011-03-15 LAB — PROTIME-INR
INR: 1.04 (ref 0.00–1.49)
Prothrombin Time: 13.8 seconds (ref 11.6–15.2)

## 2011-03-15 LAB — ABO/RH: ABO/RH(D): A POS

## 2011-03-15 LAB — TYPE AND SCREEN
ABO/RH(D): A POS
Antibody Screen: NEGATIVE

## 2011-03-15 LAB — APTT: aPTT: 27 seconds (ref 24–37)

## 2011-03-15 LAB — SURGICAL PCR SCREEN
MRSA, PCR: POSITIVE — AB
Staphylococcus aureus: POSITIVE — AB

## 2011-03-15 NOTE — Pre-Procedure Instructions (Signed)
[name]  03/15/2011   Your procedure is scheduled on:  March 14 (Thursday)  Report to [facility] at 7:00 AM.  Call this number if you have problems the morning of surgery: [phone]   Remember:   Do not eat food:After Midnight.  May have clear liquids: up to 4 Hours before arrival.  Clear liquids include soda, tea, black coffee, apple or grape juice, broth.  Take these medicines the morning of surgery with A SIP OF WATER: tylenol as needed, carvedilol, celexa, norco, STOP PLAVIX AND ASPIRIN   Do not wear jewelry, make-up or nail polish.  Do not wear lotions, powders, or perfumes. You may wear deodorant.  Do not shave 48 hours prior to surgery.  Do not bring valuables to the hospital.  Contacts, dentures or bridgework may not be worn into surgery.  Leave suitcase in the car. After surgery it may be brought to your room.  For patients admitted to the hospital, checkout time is 11:00 AM the day of discharge.   Patients discharged the day of surgery will not be allowed to drive home.  Name and phone number of your driver: NA  Special Instructions: CHG Shower Use Special Wash: 1/2 bottle night before surgery and 1/2 bottle morning of surgery.   Please read over the following fact sheets that you were given: Pain Booklet, Blood Transfusion Information, MRSA Information and Surgical Site Infection Prevention

## 2011-03-20 MED ORDER — CEFAZOLIN SODIUM-DEXTROSE 2-3 GM-% IV SOLR
2.0000 g | INTRAVENOUS | Status: AC
  Administered 2011-03-21: 2 g via INTRAVENOUS
  Filled 2011-03-20: qty 50

## 2011-03-21 ENCOUNTER — Inpatient Hospital Stay (HOSPITAL_COMMUNITY): Payer: Medicare Other

## 2011-03-21 ENCOUNTER — Encounter (HOSPITAL_COMMUNITY): Payer: Self-pay | Admitting: Surgery

## 2011-03-21 ENCOUNTER — Inpatient Hospital Stay (HOSPITAL_COMMUNITY)
Admission: RE | Admit: 2011-03-21 | Discharge: 2011-03-26 | DRG: 458 | Disposition: A | Discharge status: Skilled Nursing Facility | Payer: Medicare Other | Source: Ambulatory Visit | Attending: Neurosurgery | Admitting: Neurosurgery

## 2011-03-21 ENCOUNTER — Encounter (HOSPITAL_COMMUNITY)
Admission: RE | Disposition: A | Discharge status: Skilled Nursing Facility | Payer: Self-pay | Source: Ambulatory Visit | Attending: Neurosurgery

## 2011-03-21 ENCOUNTER — Encounter (HOSPITAL_COMMUNITY): Payer: Self-pay | Admitting: Anesthesiology

## 2011-03-21 ENCOUNTER — Inpatient Hospital Stay (HOSPITAL_COMMUNITY): Payer: Medicare Other | Admitting: Anesthesiology

## 2011-03-21 DIAGNOSIS — Z91040 Latex allergy status: Secondary | ICD-10-CM | POA: Diagnosis not present

## 2011-03-21 DIAGNOSIS — M5126 Other intervertebral disc displacement, lumbar region: Secondary | ICD-10-CM | POA: Diagnosis present

## 2011-03-21 DIAGNOSIS — M4120 Other idiopathic scoliosis, site unspecified: Principal | ICD-10-CM | POA: Diagnosis present

## 2011-03-21 DIAGNOSIS — Z01812 Encounter for preprocedural laboratory examination: Secondary | ICD-10-CM

## 2011-03-21 DIAGNOSIS — F411 Generalized anxiety disorder: Secondary | ICD-10-CM | POA: Diagnosis not present

## 2011-03-21 DIAGNOSIS — Z951 Presence of aortocoronary bypass graft: Secondary | ICD-10-CM

## 2011-03-21 DIAGNOSIS — M48061 Spinal stenosis, lumbar region without neurogenic claudication: Secondary | ICD-10-CM | POA: Diagnosis present

## 2011-03-21 DIAGNOSIS — M545 Low back pain, unspecified: Secondary | ICD-10-CM | POA: Diagnosis not present

## 2011-03-21 DIAGNOSIS — M1990 Unspecified osteoarthritis, unspecified site: Secondary | ICD-10-CM | POA: Diagnosis not present

## 2011-03-21 DIAGNOSIS — F329 Major depressive disorder, single episode, unspecified: Secondary | ICD-10-CM | POA: Diagnosis present

## 2011-03-21 DIAGNOSIS — E119 Type 2 diabetes mellitus without complications: Secondary | ICD-10-CM | POA: Diagnosis present

## 2011-03-21 DIAGNOSIS — Z886 Allergy status to analgesic agent status: Secondary | ICD-10-CM | POA: Diagnosis not present

## 2011-03-21 DIAGNOSIS — F3289 Other specified depressive episodes: Secondary | ICD-10-CM | POA: Diagnosis present

## 2011-03-21 DIAGNOSIS — IMO0002 Reserved for concepts with insufficient information to code with codable children: Secondary | ICD-10-CM | POA: Diagnosis not present

## 2011-03-21 DIAGNOSIS — Z5189 Encounter for other specified aftercare: Secondary | ICD-10-CM | POA: Diagnosis not present

## 2011-03-21 DIAGNOSIS — M4800 Spinal stenosis, site unspecified: Secondary | ICD-10-CM | POA: Diagnosis not present

## 2011-03-21 DIAGNOSIS — M549 Dorsalgia, unspecified: Secondary | ICD-10-CM | POA: Diagnosis not present

## 2011-03-21 DIAGNOSIS — M519 Unspecified thoracic, thoracolumbar and lumbosacral intervertebral disc disorder: Secondary | ICD-10-CM | POA: Diagnosis not present

## 2011-03-21 DIAGNOSIS — I10 Essential (primary) hypertension: Secondary | ICD-10-CM | POA: Diagnosis not present

## 2011-03-21 DIAGNOSIS — M5137 Other intervertebral disc degeneration, lumbosacral region: Secondary | ICD-10-CM | POA: Diagnosis not present

## 2011-03-21 DIAGNOSIS — I2510 Atherosclerotic heart disease of native coronary artery without angina pectoris: Secondary | ICD-10-CM | POA: Diagnosis present

## 2011-03-21 LAB — GLUCOSE, CAPILLARY
Glucose-Capillary: 139 mg/dL — ABNORMAL HIGH (ref 70–99)
Glucose-Capillary: 151 mg/dL — ABNORMAL HIGH (ref 70–99)

## 2011-03-21 SURGERY — POSTERIOR LUMBAR FUSION 1 LEVEL
Anesthesia: General | Site: Back | Laterality: Bilateral | Wound class: Clean

## 2011-03-21 MED ORDER — SODIUM CHLORIDE 0.9 % IJ SOLN
3.0000 mL | Freq: Two times a day (BID) | INTRAMUSCULAR | Status: DC
  Administered 2011-03-21 – 2011-03-24 (×5): 3 mL via INTRAVENOUS

## 2011-03-21 MED ORDER — SODIUM CHLORIDE 0.9 % IJ SOLN: 9.0000 mL | INTRAMUSCULAR | Status: DC | PRN

## 2011-03-21 MED ORDER — MORPHINE SULFATE (PF) 1 MG/ML IV SOLN
INTRAVENOUS | Status: DC
  Administered 2011-03-21: 7.5 mg via INTRAVENOUS
  Administered 2011-03-21: 1.5 mg via INTRAVENOUS
  Administered 2011-03-22: 7.5 mg via INTRAVENOUS
  Administered 2011-03-22: 10.5 mg via INTRAVENOUS
  Administered 2011-03-22 (×2): 4.5 mg via INTRAVENOUS
  Administered 2011-03-23: 1.5 mg via INTRAVENOUS
  Administered 2011-03-23: 0.973 mg via INTRAVENOUS
  Administered 2011-03-23: 4.5 mg via INTRAVENOUS
  Administered 2011-03-23: 3 mg via INTRAVENOUS

## 2011-03-21 MED ORDER — OXYCODONE-ACETAMINOPHEN 5-325 MG PO TABS
1.0000 | ORAL_TABLET | ORAL | Status: DC | PRN
  Administered 2011-03-22 – 2011-03-25 (×5): 2 via ORAL
  Filled 2011-03-21 (×3): qty 2
  Filled 2011-03-21: qty 1
  Filled 2011-03-21 (×2): qty 2

## 2011-03-21 MED ORDER — FENTANYL CITRATE 0.05 MG/ML IJ SOLN
INTRAMUSCULAR | Status: DC | PRN
  Administered 2011-03-21: 150 ug via INTRAVENOUS
  Administered 2011-03-21: 100 ug via INTRAVENOUS

## 2011-03-21 MED ORDER — CITALOPRAM HYDROBROMIDE 20 MG PO TABS
20.0000 mg | ORAL_TABLET | Freq: Every day | ORAL | Status: DC
  Administered 2011-03-21 – 2011-03-26 (×6): 20 mg via ORAL
  Filled 2011-03-21 (×6): qty 1

## 2011-03-21 MED ORDER — GLYCOPYRROLATE 0.2 MG/ML IJ SOLN
INTRAMUSCULAR | Status: DC | PRN
  Administered 2011-03-21: 0.4 mg via INTRAVENOUS

## 2011-03-21 MED ORDER — PROPOFOL 10 MG/ML IV EMUL
INTRAVENOUS | Status: DC | PRN
  Administered 2011-03-21: 160 mg via INTRAVENOUS

## 2011-03-21 MED ORDER — VITAMIN C 500 MG PO TABS
1000.0000 mg | ORAL_TABLET | Freq: Every day | ORAL | Status: DC
  Administered 2011-03-21 – 2011-03-26 (×6): 1000 mg via ORAL
  Filled 2011-03-21 (×6): qty 2

## 2011-03-21 MED ORDER — INSULIN ASPART 100 UNIT/ML ~~LOC~~ SOLN
0.0000 [IU] | Freq: Three times a day (TID) | SUBCUTANEOUS | Status: DC
  Administered 2011-03-23: 3 [IU] via SUBCUTANEOUS
  Administered 2011-03-23: 8 [IU] via SUBCUTANEOUS
  Administered 2011-03-24 (×2): 3 [IU] via SUBCUTANEOUS

## 2011-03-21 MED ORDER — 0.9 % SODIUM CHLORIDE (POUR BTL) OPTIME
TOPICAL | Status: DC | PRN
  Administered 2011-03-21: 1000 mL

## 2011-03-21 MED ORDER — SODIUM CHLORIDE 0.9 % IJ SOLN: 3.0000 mL | INTRAMUSCULAR | Status: DC | PRN

## 2011-03-21 MED ORDER — MORPHINE SULFATE 2 MG/ML IJ SOLN: 0.0500 mg/kg | INTRAMUSCULAR | Status: DC | PRN

## 2011-03-21 MED ORDER — HETASTARCH-ELECTROLYTES 6 % IV SOLN
INTRAVENOUS | Status: DC | PRN
  Administered 2011-03-21: 14:00:00 via INTRAVENOUS

## 2011-03-21 MED ORDER — LISINOPRIL 10 MG PO TABS
10.0000 mg | ORAL_TABLET | Freq: Every day | ORAL | Status: DC
  Administered 2011-03-21 – 2011-03-26 (×4): 10 mg via ORAL
  Filled 2011-03-21 (×6): qty 1

## 2011-03-21 MED ORDER — EZETIMIBE 10 MG PO TABS
10.0000 mg | ORAL_TABLET | Freq: Every day | ORAL | Status: DC
  Administered 2011-03-21 – 2011-03-26 (×6): 10 mg via ORAL
  Filled 2011-03-21 (×6): qty 1

## 2011-03-21 MED ORDER — NEOSTIGMINE METHYLSULFATE 1 MG/ML IJ SOLN
INTRAMUSCULAR | Status: DC | PRN
  Administered 2011-03-21: 3 mg via INTRAVENOUS

## 2011-03-21 MED ORDER — LORATADINE 10 MG PO TABS
10.0000 mg | ORAL_TABLET | Freq: Every day | ORAL | Status: DC
  Administered 2011-03-21 – 2011-03-26 (×6): 10 mg via ORAL
  Filled 2011-03-21 (×6): qty 1

## 2011-03-21 MED ORDER — MECLIZINE HCL 25 MG PO TABS
25.0000 mg | ORAL_TABLET | Freq: Three times a day (TID) | ORAL | Status: DC | PRN
  Filled 2011-03-21: qty 1

## 2011-03-21 MED ORDER — ASPIRIN BUFFERED 325 MG PO TABS: 162.5000 mg | ORAL_TABLET | Freq: Every day | ORAL | Status: DC

## 2011-03-21 MED ORDER — ACETAMINOPHEN 325 MG PO TABS: 650.0000 mg | ORAL_TABLET | ORAL | Status: DC | PRN

## 2011-03-21 MED ORDER — BACITRACIN 50000 UNITS IM SOLR
INTRAMUSCULAR | Status: AC
  Filled 2011-03-21: qty 1

## 2011-03-21 MED ORDER — PHENOL 1.4 % MT LIQD: 1.0000 | OROMUCOSAL | Status: DC | PRN

## 2011-03-21 MED ORDER — KCL IN DEXTROSE-NACL 20-5-0.45 MEQ/L-%-% IV SOLN
INTRAVENOUS | Status: DC
  Administered 2011-03-21 – 2011-03-23 (×3): via INTRAVENOUS
  Filled 2011-03-21 (×11): qty 1000

## 2011-03-21 MED ORDER — DIPHENHYDRAMINE HCL 50 MG/ML IJ SOLN: 12.5000 mg | Freq: Four times a day (QID) | INTRAMUSCULAR | Status: DC | PRN

## 2011-03-21 MED ORDER — ATORVASTATIN CALCIUM 10 MG PO TABS
10.0000 mg | ORAL_TABLET | Freq: Every day | ORAL | Status: DC
  Administered 2011-03-22 – 2011-03-25 (×4): 10 mg via ORAL
  Filled 2011-03-21 (×5): qty 1

## 2011-03-21 MED ORDER — ARTIFICIAL TEARS OP OINT
TOPICAL_OINTMENT | OPHTHALMIC | Status: DC | PRN
  Administered 2011-03-21: 1 via OPHTHALMIC

## 2011-03-21 MED ORDER — SODIUM CHLORIDE 0.9 % IR SOLN
Status: DC | PRN
  Administered 2011-03-21: 14:00:00

## 2011-03-21 MED ORDER — ONDANSETRON HCL 4 MG/2ML IJ SOLN
4.0000 mg | Freq: Four times a day (QID) | INTRAMUSCULAR | Status: DC | PRN
  Filled 2011-03-21: qty 2

## 2011-03-21 MED ORDER — MENTHOL 3 MG MT LOZG
1.0000 | LOZENGE | OROMUCOSAL | Status: DC | PRN
  Filled 2011-03-21: qty 9

## 2011-03-21 MED ORDER — ACETAMINOPHEN 500 MG PO TABS
500.0000 mg | ORAL_TABLET | Freq: Four times a day (QID) | ORAL | Status: DC | PRN
  Filled 2011-03-21: qty 1

## 2011-03-21 MED ORDER — EPHEDRINE SULFATE 50 MG/ML IJ SOLN
INTRAMUSCULAR | Status: DC | PRN
  Administered 2011-03-21: 10 mg via INTRAVENOUS
  Administered 2011-03-21: 5 mg via INTRAVENOUS

## 2011-03-21 MED ORDER — MIDAZOLAM HCL 5 MG/5ML IJ SOLN
INTRAMUSCULAR | Status: DC | PRN
  Administered 2011-03-21: 2 mg via INTRAVENOUS

## 2011-03-21 MED ORDER — HYDROMORPHONE HCL PF 1 MG/ML IJ SOLN
INTRAMUSCULAR | Status: AC
  Filled 2011-03-21: qty 1

## 2011-03-21 MED ORDER — VANCOMYCIN HCL 1000 MG IV SOLR
1000.0000 mg | INTRAVENOUS | Status: DC | PRN
  Administered 2011-03-21: 1000 mg via INTRAVENOUS

## 2011-03-21 MED ORDER — ASPIRIN EC 81 MG PO TBEC
162.0000 mg | DELAYED_RELEASE_TABLET | Freq: Every day | ORAL | Status: DC
  Administered 2011-03-21 – 2011-03-26 (×6): 162 mg via ORAL
  Filled 2011-03-21 (×6): qty 2

## 2011-03-21 MED ORDER — HYDROCODONE-ACETAMINOPHEN 5-325 MG PO TABS
1.0000 | ORAL_TABLET | ORAL | Status: DC | PRN
  Administered 2011-03-24 – 2011-03-25 (×4): 2 via ORAL
  Filled 2011-03-21 (×4): qty 2

## 2011-03-21 MED ORDER — ONDANSETRON HCL 4 MG/2ML IJ SOLN
4.0000 mg | INTRAMUSCULAR | Status: DC | PRN
  Administered 2011-03-22: 4 mg via INTRAVENOUS
  Filled 2011-03-21: qty 2

## 2011-03-21 MED ORDER — BUPIVACAINE HCL (PF) 0.5 % IJ SOLN
INTRAMUSCULAR | Status: DC | PRN
  Administered 2011-03-21: 5 mL

## 2011-03-21 MED ORDER — SODIUM CHLORIDE 0.9 % IV SOLN: 250.0000 mL | INTRAVENOUS | Status: DC

## 2011-03-21 MED ORDER — PANTOPRAZOLE SODIUM 40 MG IV SOLR
40.0000 mg | Freq: Every day | INTRAVENOUS | Status: DC
  Administered 2011-03-21 – 2011-03-22 (×2): 40 mg via INTRAVENOUS
  Filled 2011-03-21 (×3): qty 40

## 2011-03-21 MED ORDER — MORPHINE SULFATE 10 MG/ML IJ SOLN
INTRAMUSCULAR | Status: DC | PRN
  Administered 2011-03-21 (×2): 5 mg via INTRAVENOUS

## 2011-03-21 MED ORDER — LIDOCAINE-EPINEPHRINE 1 %-1:100000 IJ SOLN
INTRAMUSCULAR | Status: DC | PRN
  Administered 2011-03-21: 5 mL

## 2011-03-21 MED ORDER — CARVEDILOL 25 MG PO TABS
25.0000 mg | ORAL_TABLET | Freq: Two times a day (BID) | ORAL | Status: DC
  Administered 2011-03-24 – 2011-03-26 (×5): 25 mg via ORAL
  Filled 2011-03-21 (×11): qty 1

## 2011-03-21 MED ORDER — HYDROMORPHONE HCL PF 1 MG/ML IJ SOLN
0.2500 mg | INTRAMUSCULAR | Status: DC | PRN
  Administered 2011-03-21 (×2): 0.5 mg via INTRAVENOUS

## 2011-03-21 MED ORDER — METFORMIN HCL 500 MG PO TABS
1000.0000 mg | ORAL_TABLET | Freq: Two times a day (BID) | ORAL | Status: DC
  Administered 2011-03-22 – 2011-03-26 (×9): 1000 mg via ORAL
  Filled 2011-03-21 (×11): qty 2

## 2011-03-21 MED ORDER — ACETAMINOPHEN 650 MG RE SUPP: 650.0000 mg | RECTAL | Status: DC | PRN

## 2011-03-21 MED ORDER — VANCOMYCIN HCL IN DEXTROSE 1-5 GM/200ML-% IV SOLN
INTRAVENOUS | Status: AC
  Filled 2011-03-21: qty 200

## 2011-03-21 MED ORDER — LACTATED RINGERS IV SOLN: INTRAVENOUS | Status: DC

## 2011-03-21 MED ORDER — SODIUM CHLORIDE 0.9 % IV SOLN
INTRAVENOUS | Status: AC
  Filled 2011-03-21: qty 500

## 2011-03-21 MED ORDER — KCL IN DEXTROSE-NACL 20-5-0.45 MEQ/L-%-% IV SOLN
INTRAVENOUS | Status: AC
  Filled 2011-03-21: qty 1000

## 2011-03-21 MED ORDER — DIAZEPAM 5 MG PO TABS
5.0000 mg | ORAL_TABLET | Freq: Four times a day (QID) | ORAL | Status: DC | PRN
  Administered 2011-03-21 – 2011-03-26 (×12): 5 mg via ORAL
  Filled 2011-03-21 (×2): qty 1
  Filled 2011-03-21: qty 2
  Filled 2011-03-21 (×5): qty 1
  Filled 2011-03-21: qty 2
  Filled 2011-03-21 (×3): qty 1

## 2011-03-21 MED ORDER — MORPHINE SULFATE (PF) 1 MG/ML IV SOLN
INTRAVENOUS | Status: AC
  Filled 2011-03-21: qty 25

## 2011-03-21 MED ORDER — ZOLPIDEM TARTRATE 5 MG PO TABS
5.0000 mg | ORAL_TABLET | Freq: Every evening | ORAL | Status: DC | PRN
  Administered 2011-03-23: 5 mg via ORAL
  Filled 2011-03-21: qty 1

## 2011-03-21 MED ORDER — NITROGLYCERIN 0.4 MG SL SUBL: 0.4000 mg | SUBLINGUAL_TABLET | SUBLINGUAL | Status: DC | PRN

## 2011-03-21 MED ORDER — DOCUSATE SODIUM 100 MG PO CAPS
100.0000 mg | ORAL_CAPSULE | Freq: Two times a day (BID) | ORAL | Status: DC
  Administered 2011-03-21 – 2011-03-26 (×10): 100 mg via ORAL
  Filled 2011-03-21 (×7): qty 1

## 2011-03-21 MED ORDER — CEFAZOLIN SODIUM 1-5 GM-% IV SOLN
1.0000 g | Freq: Three times a day (TID) | INTRAVENOUS | Status: AC
  Administered 2011-03-22 (×2): 1 g via INTRAVENOUS
  Filled 2011-03-21 (×4): qty 50

## 2011-03-21 MED ORDER — INSULIN ASPART 100 UNIT/ML ~~LOC~~ SOLN: 0.0000 [IU] | Freq: Every day | SUBCUTANEOUS | Status: DC

## 2011-03-21 MED ORDER — ROCURONIUM BROMIDE 100 MG/10ML IV SOLN
INTRAVENOUS | Status: DC | PRN
  Administered 2011-03-21: 50 mg via INTRAVENOUS
  Administered 2011-03-21: 10 mg via INTRAVENOUS

## 2011-03-21 MED ORDER — THROMBIN 20000 UNITS EX KIT
PACK | CUTANEOUS | Status: DC | PRN
  Administered 2011-03-21: 14:00:00 via TOPICAL

## 2011-03-21 MED ORDER — NALOXONE HCL 0.4 MG/ML IJ SOLN: 0.4000 mg | INTRAMUSCULAR | Status: DC | PRN

## 2011-03-21 MED ORDER — INSULIN ASPART 100 UNIT/ML ~~LOC~~ SOLN
4.0000 [IU] | Freq: Three times a day (TID) | SUBCUTANEOUS | Status: DC
  Administered 2011-03-22 – 2011-03-26 (×8): 4 [IU] via SUBCUTANEOUS

## 2011-03-21 MED ORDER — ONDANSETRON HCL 4 MG/2ML IJ SOLN
INTRAMUSCULAR | Status: DC | PRN
  Administered 2011-03-21: 4 mg via INTRAVENOUS

## 2011-03-21 MED ORDER — GLIPIZIDE ER 10 MG PO TB24
10.0000 mg | ORAL_TABLET | Freq: Every day | ORAL | Status: DC
  Administered 2011-03-22 – 2011-03-26 (×5): 10 mg via ORAL
  Filled 2011-03-21 (×6): qty 1

## 2011-03-21 MED ORDER — DIPHENHYDRAMINE HCL 12.5 MG/5ML PO ELIX: 12.5000 mg | ORAL_SOLUTION | Freq: Four times a day (QID) | ORAL | Status: DC | PRN

## 2011-03-21 MED ORDER — LACTATED RINGERS IV SOLN
INTRAVENOUS | Status: DC | PRN
  Administered 2011-03-21: 13:00:00 via INTRAVENOUS

## 2011-03-21 MED ORDER — CEFAZOLIN SODIUM 1-5 GM-% IV SOLN: 1.0000 g | Freq: Three times a day (TID) | INTRAVENOUS | Status: DC

## 2011-03-21 MED ORDER — HYDROCODONE-ACETAMINOPHEN 10-325 MG PO TABS
1.0000 | ORAL_TABLET | Freq: Four times a day (QID) | ORAL | Status: DC | PRN
  Administered 2011-03-22 – 2011-03-23 (×3): 2 via ORAL
  Administered 2011-03-23: 1 via ORAL
  Administered 2011-03-23 – 2011-03-26 (×5): 2 via ORAL
  Filled 2011-03-21 (×10): qty 2

## 2011-03-21 SURGICAL SUPPLY — 72 items
BAG DECANTER FOR FLEXI CONT (MISCELLANEOUS) ×2 IMPLANT
BENZOIN TINCTURE PRP APPL 2/3 (GAUZE/BANDAGES/DRESSINGS) ×2 IMPLANT
BLADE SURG ROTATE 9660 (MISCELLANEOUS) IMPLANT
BONE VOID FILLER STRIP 10CC (Bone Implant) ×2 IMPLANT
BUR MATCHSTICK NEURO 3.0 LAGG (BURR) ×2 IMPLANT
BUR PRECISION FLUTE 5.0 (BURR) ×2 IMPLANT
CAGE TLIF 10MM (Cage) ×2 IMPLANT
CANISTER SUCTION 2500CC (MISCELLANEOUS) ×2 IMPLANT
CLOTH BEACON ORANGE TIMEOUT ST (SAFETY) ×2 IMPLANT
CONT SPEC 4OZ CLIKSEAL STRL BL (MISCELLANEOUS) ×4 IMPLANT
COVER BACK TABLE 24X17X13 BIG (DRAPES) IMPLANT
COVER TABLE BACK 60X90 (DRAPES) ×2 IMPLANT
DERMABOND ADVANCED (GAUZE/BANDAGES/DRESSINGS) ×1
DERMABOND ADVANCED .7 DNX12 (GAUZE/BANDAGES/DRESSINGS) ×1 IMPLANT
DRAPE C-ARM 42X72 X-RAY (DRAPES) ×4 IMPLANT
DRAPE LAPAROTOMY 100X72X124 (DRAPES) ×2 IMPLANT
DRAPE POUCH INSTRU U-SHP 10X18 (DRAPES) ×2 IMPLANT
DRAPE SURG 17X23 STRL (DRAPES) ×2 IMPLANT
DRESSING TELFA 8X3 (GAUZE/BANDAGES/DRESSINGS) IMPLANT
DURAPREP 26ML APPLICATOR (WOUND CARE) ×2 IMPLANT
ELECT REM PT RETURN 9FT ADLT (ELECTROSURGICAL) ×2
ELECTRODE REM PT RTRN 9FT ADLT (ELECTROSURGICAL) ×1 IMPLANT
GAUZE SPONGE 4X4 16PLY XRAY LF (GAUZE/BANDAGES/DRESSINGS) ×2 IMPLANT
GLOVE BIOGEL PI IND STRL 7.5 (GLOVE) ×1 IMPLANT
GLOVE BIOGEL PI IND STRL 8 (GLOVE) ×2 IMPLANT
GLOVE BIOGEL PI IND STRL 8.5 (GLOVE) ×4 IMPLANT
GLOVE BIOGEL PI INDICATOR 7.5 (GLOVE) ×1
GLOVE BIOGEL PI INDICATOR 8 (GLOVE) ×2
GLOVE BIOGEL PI INDICATOR 8.5 (GLOVE) ×4
GLOVE EXAM NITRILE LRG STRL (GLOVE) IMPLANT
GLOVE EXAM NITRILE MD LF STRL (GLOVE) IMPLANT
GLOVE EXAM NITRILE XL STR (GLOVE) IMPLANT
GLOVE EXAM NITRILE XS STR PU (GLOVE) IMPLANT
GLOVE SURG SS PI 7.5 STRL IVOR (GLOVE) ×4 IMPLANT
GLOVE SURG SS PI 8.0 STRL IVOR (GLOVE) ×10 IMPLANT
GOWN BRE IMP SLV AUR LG STRL (GOWN DISPOSABLE) IMPLANT
GOWN BRE IMP SLV AUR XL STRL (GOWN DISPOSABLE) ×4 IMPLANT
GOWN STRL REIN 2XL LVL4 (GOWN DISPOSABLE) ×4 IMPLANT
KIT BASIN OR (CUSTOM PROCEDURE TRAY) ×2 IMPLANT
KIT INFUSE SMALL (Orthopedic Implant) ×2 IMPLANT
KIT POSITION SURG JACKSON T1 (MISCELLANEOUS) ×2 IMPLANT
KIT ROOM TURNOVER OR (KITS) ×2 IMPLANT
MILL MEDIUM DISP (BLADE) ×4 IMPLANT
NEEDLE HYPO 25X1 1.5 SAFETY (NEEDLE) ×2 IMPLANT
NEEDLE SPNL 18GX3.5 QUINCKE PK (NEEDLE) ×2 IMPLANT
NS IRRIG 1000ML POUR BTL (IV SOLUTION) ×2 IMPLANT
PACK LAMINECTOMY NEURO (CUSTOM PROCEDURE TRAY) ×2 IMPLANT
PAD ARMBOARD 7.5X6 YLW CONV (MISCELLANEOUS) ×6 IMPLANT
PATTIES SURGICAL .5 X.5 (GAUZE/BANDAGES/DRESSINGS) IMPLANT
PATTIES SURGICAL .5 X1 (DISPOSABLE) IMPLANT
PATTIES SURGICAL 1X1 (DISPOSABLE) IMPLANT
ROD 35MM (Rod) ×4 IMPLANT
SCREW POLYAXIAL 5.5X50MM (Screw) ×8 IMPLANT
SCREW SET SPINAL STD HEXALOBE (Screw) ×8 IMPLANT
SPONGE GAUZE 4X4 12PLY (GAUZE/BANDAGES/DRESSINGS) IMPLANT
SPONGE LAP 4X18 X RAY DECT (DISPOSABLE) IMPLANT
SPONGE SURGIFOAM ABS GEL 100 (HEMOSTASIS) ×2 IMPLANT
STAPLER SKIN PROX WIDE 3.9 (STAPLE) IMPLANT
STRIP CLOSURE SKIN 1/2X4 (GAUZE/BANDAGES/DRESSINGS) IMPLANT
SUT VIC AB 1 CT1 18XBRD ANBCTR (SUTURE) ×2 IMPLANT
SUT VIC AB 1 CT1 8-18 (SUTURE) ×2
SUT VIC AB 2-0 CT1 18 (SUTURE) ×2 IMPLANT
SUT VIC AB 3-0 SH 8-18 (SUTURE) ×2 IMPLANT
SYR 20ML ECCENTRIC (SYRINGE) ×2 IMPLANT
SYR 3ML LL SCALE MARK (SYRINGE) ×6 IMPLANT
SYR 5ML LL (SYRINGE) IMPLANT
TAPE CLOTH SURG 4X10 WHT LF (GAUZE/BANDAGES/DRESSINGS) ×2 IMPLANT
TOWEL OR 17X24 6PK STRL BLUE (TOWEL DISPOSABLE) ×2 IMPLANT
TOWEL OR 17X26 10 PK STRL BLUE (TOWEL DISPOSABLE) ×2 IMPLANT
TRAP SPECIMEN MUCOUS 40CC (MISCELLANEOUS) ×2 IMPLANT
TRAY FOLEY BAG SILVER LF 14FR (CATHETERS) ×2 IMPLANT
WATER STERILE IRR 1000ML POUR (IV SOLUTION) ×2 IMPLANT

## 2011-03-21 NOTE — Interval H&P Note (Signed)
History and Physical Interval Note:  03/21/2011 7:45 AM  Andrea Kirk  has presented today for surgery, with the diagnosis of lumbar stenosis scoliosis lumbar herniated disc lumbar radiculopathy  The various methods of treatment have been discussed with the patient and family. After consideration of risks, benefits and other options for treatment, the patient has consented to  Procedure(s) (LRB): POSTERIOR LUMBAR FUSION 1 LEVEL (N/A) as a surgical intervention .  The patients' history has been reviewed, patient examined, no change in status, stable for surgery.  I have reviewed the patients' chart and labs.  Questions were answered to the patient's satisfaction.     [name]  Date of Initial H&P: 02/27/2011  History reviewed, patient examined, no change in status, stable for surgery.

## 2011-03-21 NOTE — Preoperative (Signed)
Beta Blockers   Reason not to administer Beta Blockers:Not Applicable 

## 2011-03-21 NOTE — Transfer of Care (Signed)
Immediate Anesthesia Transfer of Care Note  Patient: Andrea Kirk  Procedure(s) Performed: Procedure(s) (LRB): POSTERIOR LUMBAR FUSION 1 LEVEL (Bilateral)  Patient Location: PACU  Anesthesia Type: General  Level of Consciousness: awake, alert , oriented and patient cooperative  Airway & Oxygen Therapy: Patient Spontanous Breathing and Patient connected to nasal cannula oxygen  Post-op Assessment: Report given to PACU RN, Post -op Vital signs reviewed and stable and Patient moving all extremities X 4  Post vital signs: Reviewed and stable  Complications: No apparent anesthesia complications

## 2011-03-21 NOTE — Op Note (Signed)
03/21/2011  3:51 PM  PATIENT:  Andrea Kirk  71 y.o. female  PRE-OPERATIVE DIAGNOSIS:  lumbar four-five stenosis, scoliosis, lumbar herniated disc, lumbar radiculopathy  POST-OPERATIVE DIAGNOSIS:  lumbar four-five stenosis, scoliosis, lumbar herniated disc, lumbar radiculopathy  PROCEDURE:  Procedure(s) (LRB): POSTERIOR LUMBAR Decompression, PEEK TLIF interbody cage, bilateral pedicle screw fixation, posterolateral arthrodesis  FUSION 1 LEVEL (Bilateral) L4/5  SURGEON:  Surgeon(s) and Role:    * Maeola Harman, MD - Primary    * Reinaldo Meeker, MD - Assisting  PHYSICIAN ASSISTANT:   ASSISTANTS: [place], RN   ANESTHESIA:   general  EBL:  Total I/O In: 1500 [I.V.:1000; IV Piggyback:500] Out: 550 [Urine:300; Blood:250]  BLOOD ADMINISTERED:none  DRAINS: none   LOCAL MEDICATIONS USED:  LIDOCAINE   SPECIMEN:  No Specimen  DISPOSITION OF SPECIMEN:  PATHOLOGY  COUNTS:  YES  TOURNIQUET:  * No tourniquets in log *  DICTATION: Patient is 71 year old woman with 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 She has a severe right L4 and L5 radiculopathies.it was elected to take her to surgery for decompression and fusion at this level.   Procedure: Patient was placed in a prone position on the [place] table after smooth and uncomplicated induction of general endotracheal anesthesia. Her low back was prepped and draped in usual sterile fashion with DuraPrep. Area of incision was infiltrated with local lidocaine. Incision was made to the lumbodorsal fascia was incised and exposure was performed of the L4-L5 spinous processes laminae facet joint and transverse processes. Intraoperative x-ray was obtained which confirmed correct orientation with marker probes overlying the L3, L4, L5 transverse processes. A total laminectomy of L4 was performed with disarticulation of the facet joints at this level on the right, decompressing the L4 and L5 nerve roots beyond decompression required for  PLIF.  There was severe stenosis and a large disc herniation which was removed. A thorough discectomy was performed on the right with preparation of the endplates for grafting a trial spacer was placed this level. 3 cc of bone autograft was packed within the interspace on the right along with small BMP kit and NexOss bone graft extender. 10 mm TLIF PEEK cage  were packed with BMP and extender and was inserted the interspace and countersunk appropriately. The posterolateral region was extensively decorticated and pedicle probes were placed at L5 and L4 bilaterally. Intraoperative fluoroscopy confirmed correct orientationin the AP and lateral plane. 50 x 5.5 mm pedicle screws were placed at L5 bilaterally and 50 x 5.5 mm screws placed at L4 bilaterally. final x-rays demonstrated well-positioned interbody grafts and pedicle screw fixation. A 35 mm lordotic rod was placed bilaterally and locked down in situ and the posterolateral region was packed with the remaining BMP and bone graft extender on the left.  Fascia was closed with 1 Vicryl sutures skin edges were reapproximated 2 and 3-0 Vicryl sutures. The wound is dressed with benzoin Steri-Strips Telfa gauze and tape the patient was extubated in the operating room and taken to recovery in stable satisfactory condition he tolerated traction well counts were correct at the end of the [place].  PLAN OF CARE: Admit to inpatient   PATIENT DISPOSITION:  PACU - hemodynamically stable.   Delay start of Pharmacological VTE agent (>24hrs) due to surgical blood loss or risk of bleeding: yes

## 2011-03-21 NOTE — Progress Notes (Signed)
Care of pt assumed by MA Navada Osterhout RN 

## 2011-03-21 NOTE — Anesthesia Postprocedure Evaluation (Signed)
  Anesthesia Post-op Note  Patient: Andrea Kirk  Procedure(s) Performed: Procedure(s) (LRB): POSTERIOR LUMBAR FUSION 1 LEVEL (Bilateral)  Patient Location: PACU  Anesthesia Type: General  Level of Consciousness: awake  Airway and Oxygen Therapy: Patient Spontanous Breathing  Post-op Pain: mild  Post-op Assessment: Post-op Vital signs reviewed  Post-op Vital Signs: stable  Complications: No apparent anesthesia complications

## 2011-03-21 NOTE — Anesthesia Procedure Notes (Signed)
Procedure Name: Intubation Date/Time: 03/21/2011 1:22 PM Performed by: Leona Singleton A Pre-anesthesia Checklist: Patient identified Patient Re-evaluated:Patient Re-evaluated prior to inductionOxygen Delivery Method: Circle system utilized Preoxygenation: Pre-oxygenation with 100% oxygen Intubation Type: IV induction Ventilation: Mask ventilation without difficulty Laryngoscope Size: Miller and 2 Grade View: Grade I Tube type: Oral Tube size: 7.0 mm Number of attempts: 1 Airway Equipment and Method: Stylet Placement Confirmation: ETT inserted through vocal cords under direct vision,  positive ETCO2 and breath sounds checked- equal and bilateral Secured at: 21 cm Tube secured with: Tape Dental Injury: Teeth and Oropharynx as per pre-operative assessment

## 2011-03-21 NOTE — Anesthesia Preprocedure Evaluation (Addendum)
Anesthesia Evaluation  Patient identified by MRN, date of birth, ID band Patient awake    Reviewed: Allergy & Precautions, H&P , NPO status , Patient's Chart, lab work & pertinent test results, reviewed documented beta blocker date and time   History of Anesthesia Complications (+) Emergence Delirium  Airway Mallampati: II TM Distance: >3 FB Neck ROM: Full    Dental  (+) Teeth Intact and Dental Advisory Given   Pulmonary neg pulmonary ROS,  breath sounds clear to auscultation        Cardiovascular hypertension, Pt. on medications + angina with exertion + CAD and + CABG (1997) Rhythm:Regular Rate:Normal  Card stress test 05/2010  Good LV function, EF 64%   Neuro/Psych PSYCHIATRIC DISORDERS Anxiety Depression  Neuromuscular disease (pain and weakness BLE, right > left)    GI/Hepatic negative GI ROS, Neg liver ROS,   Endo/Other  Diabetes mellitus-, Well Controlled, Type 2, Oral Hypoglycemic Agents  Renal/GU negative Renal ROS     Musculoskeletal  (+) Arthritis -, Osteoarthritis,    Abdominal (+) + obese,   Peds negative pediatric ROS (+)  Hematology negative hematology ROS (+)   Anesthesia Other Findings   Reproductive/Obstetrics negative OB ROS                         Anesthesia Physical Anesthesia Plan  ASA: III  Anesthesia Plan: General   Post-op Pain Management:    Induction: Intravenous  Airway Management Planned: Oral ETT  Additional Equipment:   Intra-op Plan:   Post-operative Plan: Extubation in OR  Informed Consent:   Plan Discussed with:   Anesthesia Plan Comments:         Anesthesia Quick Evaluation

## 2011-03-21 NOTE — H&P (Signed)
Andrea Kirk #[id number] DOB: [birth year] 02/27/2011: Andrea Kirk returns today to review her MRI of her lumbar spine that demonstrates that she has disc degeneration at L2-3 which is mild with early facet degenerative changes and at L3-4 she has disc degeneration with diffuse bulging of the disc and mild spurring, which is causing mild to moderate spinal stenosis which is not felt to be unchanged, which is felt to be unchanged from the prior MRI. At the L4-5 level she has broad-based disc protrusion with bilateral facet and ligamentous thickening and moderate spinal stenosis. She has some mild degenerative changes at the L5-S1 level. 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 She also has some coronal instability with scoliosis based on her AP film.  In light of her imaging findings and her significant pain complaints, I have recommended that she undergo L4-5 decompression and fusion. I explained to her that I was concerned that a decompressive operation without fusion would have a high risk of failure, and also that to my review I do not think that the L3-4 level is sufficiently severely affected that that warrants surgical intervention at this time. She does have multilevel degenerative changes in the lumbar spine, but I think that her symptomatic radiculopathy and pain complaints relate to the L4-5 process. She knows that she needs to be off Plavix for ten days prior to surgery. She is going to speak with her cardiologist at [facility] and we will get clearance for that. She wishes to go ahead with surgery and this has been scheduled for 03/21/2011. She was fitted for an LSO brace today in the office. I then spoke with her brother, who is a retired Clinical research associate, living in [place] on the telephone in her presence and reviewed my findings and my recommendations. They are both comfortable with these recommendations and she wishes to proceed. Risks and benefits were discussed in detail.  I went over the surgical  procedure with the patient in great detail today. I went over the diagnostic studies, as well as surgical models. We discussed the potential risks and benefits of the surgery, as well as expected hospital course. The patient would generally wear a brace for three months after surgery. The risks include, but are not limited to, bleeding, possible need for transfusion, infection, damage to nerves and vessels, risks of anesthesia, dural tear, injury to lumbar nerve roots causing either temporary or permanent leg pain, numbness or weakness, malpositioning of instrumentation, fusion failure, failure to relieve pain, back pain after surgery, recurrent disc herniation, worsening of pain and adjacent degeneration after a spinal fusion. Also, the potential for the need for further surgery in the event of incomplete fusion. We also discussed the risks of injury to abdominal structures including bowel, iliac artery or vein injury.  Andrea Kirk. Andrea Kirk, M.D./aft cc: Dr. Frederico Hamman  NEUROSURGICAL CONSULTATION Andrea Kirk DOB: 09/14/1940 #[id number] May 22, 2009  HISTORY: Andrea Kirk is a 71 year old woman who works at [employer]. 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 She complains of low back and left leg pain, and she says it has been going on since February of 2011 and has been worsening since then. She also notes some pain around her waist. She has been taking Hydrocodone 5/325 two per day and says that she was taking seven per day over the weekend after lifting her husband, who she is the caregiver for. She has also been on Plavix.  She has been in physical therapy and has had two injections on  the left by Dr. Alvester Morin which she says helped.  REVIEW OF SYSTEMS: A detailed Review of Systems sheet was reviewed with the patient. Pertinent positives include night and day sweats, wears glasses, nosebleeds, sinus headache, high blood pressure, high cholesterol, shortness of breath, indigestion or pain with eating, back pain, depression,  and diabetes. All other systems are negative; this includes Genitourinary, Integumentary & Breast, Neurologic, Hematologic/Lymphatic, Allergic/Immunologic.  PAST MEDICAL HISTORY:   Current Medical Conditions: She has a history of hypertension, noninsulin dependent diabetes and depression.   Prior Operations and Hospitalizations: She has had coronary artery bypass x six in 01/1995 and OD corneal transplant in 03/2001.   Medications and Allergies: Medications - Zetia 10 mg. qd, Crestor 20 mg. qd, Plavix 75 mg. qd, Lisinopril 10 mg. qd, Carvedilol 25 mg. bid, Citalopram 10 mg. qd, Glipizide 10 mg. qd, and Hydrocodone 5/325 bid. No known drug allergies.   Height and Weight: She is currently 5'5" tall, 186 lbs. BMI is 30.9.  FAMILY HISTORY: Her mother died at age 8 of lung disease. His father died at age 69 of a heart attack.  SOCIAL HISTORY: She denies tobacco, alcohol or drug use. She is having to take care of all her husband's ADLs. She sees Dr. Arbie Cookey June 10th to check her carotid arteries. She says that she is having quite a bit of pain in her right leg at this point.  DIAGNOSTIC STUDIES: She had an MRI of her lumbar spine performed on 12/27/2008 which shows spondylosis worse at L4-5 with moderate to severe stenosis, marked left foraminal narrowing, left greater than right lateral recess narrowing, and moderate central stenosis at L3-4 with multilevel facet arthropathy, worse bilaterally at L3-4 and on the right at L5-S1. This is based on an MRI that was performed through [facility] on 12/27/2008.  PHYSICAL EXAMINATION:   General Appearance: On examination today, Andrea Kirk is a pleasant and cooperative woman in no acute distress. She is morbidly obese.   Blood Pressure, Pulse: Her blood pressure is 130/72. Heart rate is 72 and irregular. Respiratory rate is 18.   Respiratory - there is normal respiratory effort with good intercostal function. Lungs are clear to auscultation. There are no  rales, rhonchi or wheezes.   Cardiovascular - the heart has regular rate and rhythm to auscultation. No murmurs are appreciated. There is no extremity edema, cyanosis or clubbing. There are palpable pedal pulses.   Abdomen - soft, nontender, no hepatosplenomegaly appreciated or masses. There are active bowel sounds. No guarding or rebound.   Musculoskeletal Examination - she has pain on the right side of her greater trochanter bursa. She has right sciatic notch discomfort and appears to have more pain in her sciatic notch than over her greater trochanter. She is able to stand on her heels and toes, but walks with an antalgic gait favoring her right lower extremity. She is only able to bend to within 24" of the floor with her upper extremities outstretched. She has a positive straight leg raise on the right at 30 degrees.  NEUROLOGICAL EXAMINATION: The patient is oriented to time, person and place and has good recall of both recent and remote memory with normal attention span and concentration. The patient speaks with clear and fluent speech and exhibits normal language function and appropriate fund of knowledge.   Cranial Nerve Examination - pupils are equal, round and reactive to light. Extraocular movements are full. Visual fields are full to confrontational testing. Facial sensation and facial movement are symmetric  and intact. Hearing is intact to finger rub. Palate is upgoing. Shoulder shrug is symmetric. Tongue protrudes in the midline.   Motor Examination - motor strength is 5/5 in the bilateral deltoids, biceps, triceps, handgrips, wrist extensors, interosseous. In the lower extremities motor strength is 5/5 in hip flexion, extension, quadriceps, hamstrings, plantar flexion, 4/5 right hip abductor strength, 4+/5 right dorsiflexion, 5/5 left dorsiflexion, 4/5 right extensor hallucis longus strength, and 5/5 left extensor hallucis longus.   Sensory Examination - she has decreased pin sensation in  a right L5 distribution.   Deep Tendon Reflexes - 2 in the biceps, triceps, and brachioradialis, 2 in the knees, 2 in the ankles. The great toes are downgoing to plantar stimulation.   Cerebellar Examination - normal coordination in upper and lower extremities and normal rapid alternating movements. Romberg test is negative.  IMPRESSION AND RECOMMENDATIONS: Andrea Kirk is a 71 year old woman with low back and right leg pain. She has had two previous injections by Dr. Alvester Morin which helped her on the left side, but has now started having worsening right-sided symptoms. I have suggested that she stop her Plavix and Aspirin seven days prior to the injection, and we will go ahead with a right L5 selective nerve root block to see if this will give her any relief of her discomfort. I will make further recommendations after that has been done.  [facility] Andrea Kirk. Andrea Kirk, M.D. Andrea Kirk:aft cc: Dr. Frederico Hamman

## 2011-03-22 LAB — HEMOGLOBIN A1C
Hgb A1c MFr Bld: 6.6 % — ABNORMAL HIGH (ref <5.7)
Mean Plasma Glucose: 143 mg/dL — ABNORMAL HIGH (ref <117)

## 2011-03-22 LAB — GLUCOSE, CAPILLARY
Glucose-Capillary: 126 mg/dL — ABNORMAL HIGH (ref 70–99)
Glucose-Capillary: 220 mg/dL — ABNORMAL HIGH (ref 70–99)
Glucose-Capillary: 176 mg/dL — ABNORMAL HIGH (ref 70–99)
Glucose-Capillary: 136 mg/dL — ABNORMAL HIGH (ref 70–99)
Glucose-Capillary: 172 mg/dL — ABNORMAL HIGH (ref 70–99)

## 2011-03-22 MED ORDER — CHLORHEXIDINE GLUCONATE CLOTH 2 % EX PADS
6.0000 | MEDICATED_PAD | Freq: Every day | CUTANEOUS | Status: DC
  Administered 2011-03-23 – 2011-03-26 (×4): 6 via TOPICAL

## 2011-03-22 MED ORDER — MUPIROCIN 2 % EX OINT
TOPICAL_OINTMENT | Freq: Two times a day (BID) | CUTANEOUS | Status: DC
  Administered 2011-03-22 – 2011-03-26 (×8): via NASAL
  Filled 2011-03-22: qty 22

## 2011-03-22 MED ORDER — MORPHINE SULFATE (PF) 1 MG/ML IV SOLN
INTRAVENOUS | Status: AC
  Administered 2011-03-22: 4.5 mg via INTRAVENOUS
  Filled 2011-03-22: qty 25

## 2011-03-22 MED ORDER — PNEUMOCOCCAL VAC POLYVALENT 25 MCG/0.5ML IJ INJ
0.5000 mL | INJECTION | INTRAMUSCULAR | Status: AC
  Filled 2011-03-22: qty 0.5

## 2011-03-22 NOTE — Progress Notes (Signed)
OT Cancellation Note  Treatment cancelled today due to medical issues with patient which prohibited therapy.  Pt with decreased BP per RN report.  Will re-attempt as appropriate.   03/22/2011 Cipriano Mile OTR/L Pager [phone] Office [phone]

## 2011-03-22 NOTE — Progress Notes (Signed)
Patient walked till the door and turned around and came back to bed early this morning. Tolerated fairly. Patient refused  to take out the foley at this time. Foley left in place.

## 2011-03-22 NOTE — Progress Notes (Signed)
Mobilize as tolerated with PT.

## 2011-03-22 NOTE — Progress Notes (Signed)
Clinical Social Work Department BRIEF PSYCHOSOCIAL ASSESSMENT 03/22/2011  Patient:  Andrea Kirk, Andrea Kirk     Account Number:  [id number]     Admit date:  03/21/2011  Clinical Social Worker:  Doreene Nest  Date/Time:  03/22/2011 12:50 PM  Referred by:  Physician  Date Referred:  03/22/2011 Referred for  SNF Placement   Other Referral:   Interview type:  Patient Other interview type:    PSYCHOSOCIAL DATA Living Status:  ALONE Admitted from facility:   Level of care:   Primary support name:  Elie Goody Primary support relationship to patient:  FRIEND Degree of support available:    CURRENT CONCERNS Current Concerns  Post-Acute Placement   Other Concerns:    SOCIAL WORK ASSESSMENT / PLAN CSW spoke with pt about d/c plans.  Plan to discharge to SNF when medically stable.  CSW will begin placement process and continue to follow to assist with d/c planning.   Assessment/plan status:  Psychosocial Support/Ongoing Assessment of Needs Other assessment/ plan:   Information/referral to community resources:    PATIENT'S/FAMILY'S RESPONSE TO PLAN OF CARE: Pt was aware of SNF's from when her husband was in one previously.  Pt had some preferences and was thankful CSW could assist with d/c planning.

## 2011-03-22 NOTE — Progress Notes (Signed)
Physical Therapy Evaluation Patient Details Name: Andrea Kirk MRN: [id number] DOB: [birth year] Today's Date: 03/22/2011  Problem List: There is no problem list on file for this patient.   Past Medical History:  Past Medical History  Diagnosis Date  . Hypertension   . Hyperlipidemia   . Anxiety   . Diabetes mellitus   . Angina    Past Surgical History:  Past Surgical History  Procedure Date  . Coronary artery bypass graft 1997  . Eye surgery     CORNEA TRANSPLANT RT    PT Assessment/Plan/Recommendation PT Assessment Clinical Impression Statement: Pt presents with a medical diagnosis of PLIF L4-5 along with the following impairments/deficits and therapy diagnosis listed below. Pt will benefit from skilled PT in the acute care setting in order to maximize functional mobility prior to d/c PT Recommendation/Assessment: Patient will need skilled PT in the acute care venue PT Problem List: Decreased activity tolerance;Decreased mobility;Decreased knowledge of use of DME;Decreased knowledge of precautions;Pain PT Therapy Diagnosis : Difficulty walking;Acute pain PT Plan PT Frequency: Min 5X/week PT Treatment/Interventions: DME instruction;Gait training;Stair training;Functional mobility training;Therapeutic activities;Therapeutic exercise;Patient/family education PT Recommendation Follow Up Recommendations: Skilled nursing facility Equipment Recommended: Defer to next venue PT Goals  Acute Rehab PT Goals PT Goal Formulation: With patient Time For Goal Achievement: 7 days Pt will go Supine/Side to Sit: with modified independence PT Goal: Supine/Side to Sit - Progress: Goal set today Pt will go Sit to Supine/Side: with modified independence PT Goal: Sit to Supine/Side - Progress: Goal set today Pt will go Sit to Stand: with supervision PT Goal: Sit to Stand - Progress: Goal set today Pt will go Stand to Sit: with supervision PT Goal: Stand to Sit - Progress: Goal set today Pt will  Transfer Bed to Chair/Chair to Bed: with supervision PT Transfer Goal: Bed to Chair/Chair to Bed - Progress: Goal set today Pt will Ambulate: 51 - 150 feet;with supervision;with least restrictive assistive device PT Goal: Ambulate - Progress: Goal set today  PT Evaluation Precautions/Restrictions  Precautions Precautions: Back Precaution Booklet Issued: Yes (comment) Precaution Comments: pt educated on 3/3 back precautions Required Braces or Orthoses: Yes Spinal Brace: Lumbar corset Restrictions Weight Bearing Restrictions: No Prior Functioning  Home Living Lives With: Alone Receives Help From: Friend(s) (friends can come intermittently) Type of Home: Apartment Home Layout: One level Home Access: Level entry Bathroom Shower/Tub: Tub/shower unit;Curtain Firefighter: Standard Bathroom Accessibility: Yes How Accessible: Accessible via walker Home Adaptive Equipment: Straight cane Prior Function Level of Independence: Independent with basic ADLs;Independent with gait;Independent with transfers;Independent with homemaking with ambulation Able to Take Stairs?: Yes Driving: Yes Vocation: Retired Leisure: Hobbies-yes (Comment) Comments: read and needlepoint Cognition Cognition Arousal/Alertness: Awake/alert Overall Cognitive Status: Appears within functional limits for tasks assessed Orientation Level: Oriented X4 Sensation/Coordination Sensation Light Touch: Appears Intact Coordination Gross Motor Movements are Fluid and Coordinated: Yes Extremity Assessment RLE Assessment RLE Assessment: Within Functional Limits LLE Assessment LLE Assessment: Within Functional Limits Mobility (including Balance) Bed Mobility Bed Mobility: Yes Rolling Right: 3: Mod assist;With rail Rolling Right Details (indicate cue type and reason): VC for proper sequencing to maintain back precautions. Assist with forward shoulder motion and LEs Right Sidelying to Sit: 3: Mod assist;With rails;HOB  elevated (comment degrees) Right Sidelying to Sit Details (indicate cue type and reason): VC for sequencing to maintain back precautions. Assist with trunk and tactile cues through pelvis. Sitting - Scoot to Edge of Bed: 5: Supervision Sitting - Scoot to [place] of Bed Details (indicate cue type  and reason): VC for weight shifting Transfers Transfers: Yes Sit to Stand: 4: Min assist;With upper extremity assist;From bed Sit to Stand Details (indicate cue type and reason): VC for proper sequencing to maintain back precautions with standing. Tactile cues for anterior translation for a safe stand and cues for hand placemen. Stand to Sit: 4: Min assist;With upper extremity assist;To chair/3-in-1 Stand to Sit Details: VC for hand placment and controlled descent. Ambulation/Gait Ambulation/Gait: Yes Ambulation/Gait Assistance: 4: Min assist Ambulation/Gait Assistance Details (indicate cue type and reason): VC for safe distance to RW and cues to maintain back precautions during turns. Ambulation Distance (Feet): 25 Feet Assistive device: Rolling walker Gait Pattern: Step-to pattern;Decreased stride length;Decreased hip/knee flexion - right;Decreased hip/knee flexion - left;Antalgic Gait velocity: decreased gait speed Stairs: No    Exercise    End of Session PT - End of Session Equipment Utilized During Treatment: Gait belt;Back brace Activity Tolerance: Patient limited by fatigue;Patient limited by pain Patient left: in chair;with call bell in reach Nurse Communication: Mobility status for transfers;Mobility status for ambulation General Behavior During Session: [facility] for tasks performed Cognition: [facility] for tasks performed  Milana Kidney 03/22/2011, 10:23 AM  03/22/2011 Milana Kidney DPT PAGER: [phone] OFFICE: [phone]

## 2011-03-22 NOTE — Progress Notes (Signed)
Subjective: Patient reports "My back is sore, but my legs are fine"  Objective: Vital signs in last 24 hours: Temp:  [97 F (36.1 C)-100.3 F (37.9 C)] 100.3 F (37.9 C) (03/15 0600) Pulse Rate:  [61-81] 81  (03/15 0600) Resp:  [14-49] 19  (03/15 0600) BP: (93-137)/(57-78) 93/58 mmHg (03/15 0600) SpO2:  [96 %-100 %] 98 % (03/15 0600)  Intake/Output from previous day: 03/14 0701 - 03/15 0700 In: 2480 [P.O.:100; I.V.:1880; IV Piggyback:500] Out: 1010 [Urine:760; Blood:250] Intake/Output this shift:    Alert, conversant. Drsg intact, dry. Good strength BLE. T=100.3, agrees to use incentive spirometer and mobilize with PT today.  Lab Results: No results found for this basename: WBC:2,HGB:2,HCT:2,PLT:2 in the last 72 hours BMET No results found for this basename: NA:2,K:2,CL:2,CO2:2,GLUCOSE:2,BUN:2,CREATININE:2,CALCIUM:2 in the last 72 hours  Studies/Results: Dg Lumbar Spine 2-3 Views  03/21/2011  *RADIOLOGY REPORT*  Clinical Data: L4-L5 fusion.  LUMBAR SPINE - 2-3 VIEW  Comparison: Earlier today 1406 hours.  Findings: 1427 hours.  Lateral and 2 AP views.  These demonstrate trans pedicle screw fixation posteriorly at L4-L5. No acute hardware complication.  Overlying tissue expanders on the AP views.  IMPRESSION: Intraoperative imaging of L4-L5 fixation.  Original Report Authenticated By: Consuello Bossier, M.D.   Dg Lumbar Spine 1 View  03/21/2011  *RADIOLOGY REPORT*  Clinical Data: Lumbar disc protrusion.  LUMBAR SPINE - 1 VIEW  Comparison: MRI dated 02/15/2011  Findings: There are two instruments at the L4-5 level.  There is a third instrument at the inferior margin of the pedicles of L3.  IMPRESSION: Instruments at the L3-4 and L4-5.  Original Report Authenticated By: Gwynn Burly, M.D.    Assessment/Plan: Improving.  LOS: 1 day  Per Dr. Venetia Maxon, mobilize in LSO with PT; Incentive Spirometer.   Georgiann Cocker 03/22/2011, 8:33 AM

## 2011-03-23 LAB — GLUCOSE, CAPILLARY
Glucose-Capillary: 168 mg/dL — ABNORMAL HIGH (ref 70–99)
Glucose-Capillary: 298 mg/dL — ABNORMAL HIGH (ref 70–99)
Glucose-Capillary: 192 mg/dL — ABNORMAL HIGH (ref 70–99)
Glucose-Capillary: 194 mg/dL — ABNORMAL HIGH (ref 70–99)

## 2011-03-23 MED ORDER — HYDROXYZINE HCL 50 MG/ML IM SOLN
50.0000 mg | INTRAMUSCULAR | Status: DC | PRN
  Filled 2011-03-23: qty 1

## 2011-03-23 MED ORDER — MORPHINE SULFATE (PF) 1 MG/ML IV SOLN
INTRAVENOUS | Status: AC
  Filled 2011-03-23: qty 25

## 2011-03-23 MED ORDER — MORPHINE SULFATE 4 MG/ML IJ SOLN: 4.0000 mg | INTRAMUSCULAR | Status: DC | PRN

## 2011-03-23 MED ORDER — PANTOPRAZOLE SODIUM 40 MG PO TBEC
40.0000 mg | DELAYED_RELEASE_TABLET | Freq: Every day | ORAL | Status: DC
  Administered 2011-03-23 – 2011-03-26 (×4): 40 mg via ORAL
  Filled 2011-03-23 (×3): qty 1

## 2011-03-23 MED ORDER — HYDROXYZINE HCL 25 MG PO TABS: 50.0000 mg | ORAL_TABLET | ORAL | Status: DC | PRN

## 2011-03-23 NOTE — Progress Notes (Signed)
Physical Therapy Treatment Patient Details Name: Andrea Kirk MRN: [id number] DOB: August 19, 1940 Today's Date: 03/23/2011  PT Assessment/Plan  PT - Assessment/Plan Comments on Treatment Session: Pt progressing, although she still requires increased assistance with bed mobility. She moves extremely slow but requires little assist during ambulation. PT Plan: Discharge plan remains appropriate;Frequency remains appropriate PT Frequency: Min 5X/week Follow Up Recommendations: Skilled nursing facility Equipment Recommended: Defer to next venue PT Goals  Acute Rehab PT Goals PT Goal Formulation: With patient PT Goal: Supine/Side to Sit - Progress: Progressing toward goal PT Goal: Sit to Supine/Side - Progress: Progressing toward goal PT Goal: Sit to Stand - Progress: Progressing toward goal PT Goal: Stand to Sit - Progress: Progressing toward goal PT Transfer Goal: Bed to Chair/Chair to Bed - Progress: Progressing toward goal PT Goal: Ambulate - Progress: Progressing toward goal  PT Treatment Precautions/Restrictions  Precautions Precautions: Back Precaution Booklet Issued: Yes (comment) Precaution Comments: Pt able to verbalize 2/3 back precautions. maintained back precautions with cueing throughout Required Braces or Orthoses: Yes Spinal Brace: Lumbar corset Restrictions Weight Bearing Restrictions: No Mobility (including Balance) Bed Mobility Bed Mobility: Yes Rolling Right: With rail;4: Min assist Rolling Right Details (indicate cue type and reason): Continuous verbal cues for sequencing throughout. Pt required min assist to complete sidelying right Right Sidelying to Sit: 3: Mod assist;With rails;HOB elevated (comment degrees) Right Sidelying to Sit Details (indicate cue type and reason): VC for sequencing and hand placement. Assist for trunk support and to maintain upright posture Sitting - Scoot to Edge of Bed: 5: Supervision Sitting - Scoot to Edge of Bed Details (indicate cue  type and reason): VC for weight shifting Sit to Supine: 3: Mod assist;HOB elevated (comment degrees) Sit to Supine - Details (indicate cue type and reason): Mod assist with LEs and trunk control into supine. VC for sequencing. Transfers Transfers: Yes Sit to Stand: 4: Min assist;With upper extremity assist;From bed Sit to Stand Details (indicate cue type and reason): VC for proper sequencing to maintain back precautions with standing. Tactile cues for anterior translation for a safe stand and cues for hand placemen. Stand to Sit: 4: Min assist;With upper extremity assist;To chair/3-in-1 Stand to Sit Details: VC for hand placment and controlled descent as well as sequencing cues for safety from RW Ambulation/Gait Ambulation/Gait: Yes Ambulation/Gait Assistance: 4: Min assist Ambulation/Gait Assistance Details (indicate cue type and reason): VC throughout for safety and sequencing. Pt moves at an extremely slow pace but required very little physical assist for support and stabilization Ambulation Distance (Feet): 30 Feet Assistive device: Rolling walker Gait Pattern: Step-to pattern;Decreased stride length;Decreased hip/knee flexion - right;Decreased hip/knee flexion - left;Antalgic Gait velocity: decreased gait speed Stairs: No    Exercise    End of Session PT - End of Session Equipment Utilized During Treatment: Gait belt;Back brace Activity Tolerance: Patient tolerated treatment well Patient left: in chair;with call bell in reach;with family/visitor present Nurse Communication: Mobility status for transfers;Mobility status for ambulation General Behavior During Session: [facility] for tasks performed Cognition: [facility] for tasks performed  Milana Kidney 03/23/2011, 5:36 PM  03/23/2011 Milana Kidney DPT PAGER: [phone] OFFICE: [phone]

## 2011-03-23 NOTE — Progress Notes (Signed)
Patient ID: Andrea Kirk, female   DOB: Oct 02, 1940, 71 y.o.   MRN: [id number] C/p\o incisional pain. Foley in place. Was oob this am. Pt/ot to see and help.

## 2011-03-23 NOTE — Progress Notes (Signed)
Physical Therapy Treatment Patient Details Name: Andrea Kirk MRN: [id number] DOB: 06-Mar-1940 Today's Date: 03/23/2011  PT Assessment/Plan  PT - Assessment/Plan Comments on Treatment Session: Upon sitting, pt felt dizzy and was becoming pale. RN in room and aware, believes it is a possible side effect to medication. Pt with low BPs(see vitals). Pt returned to supine. Will attempt treatment this afternoon pending medical stability. PT Plan: Discharge plan remains appropriate;Frequency remains appropriate PT Frequency: Min 5X/week Follow Up Recommendations: Skilled nursing facility Equipment Recommended: Defer to next venue PT Goals  Acute Rehab PT Goals PT Goal Formulation: With patient PT Goal: Supine/Side to Sit - Progress: Progressing toward goal PT Goal: Sit to Supine/Side - Progress: Progressing toward goal  PT Treatment Precautions/Restrictions  Precautions Precautions: Back Precaution Booklet Issued: Yes (comment) Precaution Comments: pt educated on 3/3 back precautions Required Braces or Orthoses: Yes Spinal Brace: Lumbar corset Restrictions Weight Bearing Restrictions: No Mobility (including Balance) Bed Mobility Bed Mobility: Yes Rolling Right: With rail;4: Min assist Rolling Right Details (indicate cue type and reason): Continuous verbal cues for sequencing throughout. Pt required min assist to complete sidelying right Right Sidelying to Sit: 3: Mod assist;With rails;HOB elevated (comment degrees) Right Sidelying to Sit Details (indicate cue type and reason): VC for sequencing and hand placement. Assist for trunk support and to maintain upright posture Sitting - Scoot to Edge of Bed: 5: Supervision Sitting - Scoot to Edge of Bed Details (indicate cue type and reason): VC for weight shifting Sit to Supine: 3: Mod assist;HOB elevated (comment degrees) Sit to Supine - Details (indicate cue type and reason): Mod assist with LEs and trunk control into supine. VC for  sequencing. Transfers Transfers: No (see below)    Exercise    End of Session PT - End of Session Equipment Utilized During Treatment: Gait belt;Back brace Activity Tolerance: Patient limited by fatigue;Treatment limited secondary to medical complications (Comment) Patient left: in bed;with call bell in reach Nurse Communication: Mobility status for transfers;Mobility status for ambulation;Other (comment) (RN in room during sitting) General Behavior During Session: [facility] for tasks performed Cognition: [facility] for tasks performed  Milana Kidney 03/23/2011, 2:40 PM  03/23/2011 Milana Kidney DPT PAGER: [phone] OFFICE: [phone]

## 2011-03-24 LAB — GLUCOSE, CAPILLARY
Glucose-Capillary: 120 mg/dL — ABNORMAL HIGH (ref 70–99)
Glucose-Capillary: 184 mg/dL — ABNORMAL HIGH (ref 70–99)
Glucose-Capillary: 158 mg/dL — ABNORMAL HIGH (ref 70–99)
Glucose-Capillary: 100 mg/dL — ABNORMAL HIGH (ref 70–99)

## 2011-03-24 NOTE — Progress Notes (Signed)
Filed Vitals:   03/24/11 0025 03/24/11 0340 03/24/11 0616 03/24/11 0958  BP: 102/69 123/78 138/86 106/66  Pulse: 97 85 91 89  Temp: 98.3 F (36.8 C) 97.6 F (36.4 C) 97.5 F (36.4 C) 98 F (36.7 C)  TempSrc: Oral Oral Oral Oral  Resp: 20 16 20 19   Height:      Weight:      SpO2: 92% 96% 90% 92%    Patient's progress is slow. Mobility is limited. Continuing PT and OT.  Plan: Continued postoperative care. Patient looking forward to speaking with Dr. Venetia Maxon tomorrow.  Hewitt Shorts, MD 03/24/2011, 12:10 PM

## 2011-03-24 NOTE — Progress Notes (Signed)
Chart reviewed.  Pt. For SNF placement, will defer OT to SNF as all OT needs will be addressed there.  Thanks!  Jeani Hawking, OTR/L [phone]

## 2011-03-25 LAB — GLUCOSE, CAPILLARY
Glucose-Capillary: 114 mg/dL — ABNORMAL HIGH (ref 70–99)
Glucose-Capillary: 137 mg/dL — ABNORMAL HIGH (ref 70–99)
Glucose-Capillary: 141 mg/dL — ABNORMAL HIGH (ref 70–99)

## 2011-03-25 NOTE — Progress Notes (Signed)
Subjective: Patient reports intermittent right leg and low back pain, improving  Objective: Vital signs in last 24 hours: Temp:  [98 F (36.7 C)-98.5 F (36.9 C)] 98.5 F (36.9 C) (03/17 1821) Pulse Rate:  [74-89] 74  (03/17 1821) Resp:  [18-19] 19  (03/17 1821) BP: (86-106)/(60-67) 86/60 mmHg (03/17 1821) SpO2:  [92 %-95 %] 95 % (03/17 1821)  Intake/Output from previous day:   Intake/Output this shift:    Physical Exam: Full strength both legs.  No numbness.  Dressing CDI.  Lab Results: No results found for this basename: WBC:2,HGB:2,HCT:2,PLT:2 in the last 72 hours BMET No results found for this basename: NA:2,K:2,CL:2,CO2:2,GLUCOSE:2,BUN:2,CREATININE:2,CALCIUM:2 in the last 72 hours  Studies/Results: No results found.  Assessment/Plan: Change dressing, continue to mobilize.  Anticipate DC to SNF.    LOS: 4 days    Dorian Heckle, MD 03/25/2011, 7:26 AM

## 2011-03-25 NOTE — Progress Notes (Signed)
Physical Therapy Treatment Patient Details Name: Andrea Kirk MRN: [id number] DOB: 1940/07/21 Today's Date: 03/25/2011  PT Assessment/Plan  PT - Assessment/Plan Comments on Treatment Session: Pt limited by Rt. LE  radiating pain today, decreased ambulation distance. Continues with slow movements.Will continue to progress as tolerated. PT Plan: Discharge plan remains appropriate;Frequency remains appropriate PT Frequency: Min 5X/week Follow Up Recommendations: Skilled nursing facility Equipment Recommended: Defer to next venue PT Goals  Acute Rehab PT Goals Pt will go Supine/Side to Sit: with modified independence PT Goal: Supine/Side to Sit - Progress: Progressing toward goal Pt will go Sit to Stand: with supervision PT Goal: Sit to Stand - Progress: Not progressing Pt will go Stand to Sit: with supervision PT Goal: Stand to Sit - Progress: Progressing toward goal Pt will Transfer Bed to Chair/Chair to Bed: with supervision PT Transfer Goal: Bed to Chair/Chair to Bed - Progress: Progressing toward goal Pt will Ambulate: 51 - 150 feet;with supervision;with least restrictive assistive device PT Goal: Ambulate - Progress: Not progressing  PT Treatment Precautions/Restrictions  Precautions Precautions: Fall;Back Precaution Booklet Issued: Yes (comment) Precaution Comments: Pt able to verbalize 2/3 back precautions. Maintained back precautions with cueing throughout  Required Braces or Orthoses: Yes Spinal Brace: Lumbar corset;Applied in sitting position Restrictions Weight Bearing Restrictions: No Mobility (including Balance) Bed Mobility Bed Mobility: Yes Rolling Right: With rail;5: Supervision Rolling Right Details (indicate cue type and reason): Repeated verbal cues to initiate. Cue for log roll and sequencing.  Right Sidelying to Sit: With rails;3: Mod assist Right Sidelying to Sit Details (indicate cue type and reason): VC for sequencing and hand placement. Assist for trunk  support and to maintain upright posture  Sitting - Scoot to Edge of Bed: 5: Supervision Sitting - Scoot to Edge of Bed Details (indicate cue type and reason): Verbal cues for weight shift and to avoid twisting Transfers Transfers: Yes Sit to Stand: With upper extremity assist;From bed;3: Mod assist;From chair/3-in-1 Sit to Stand Details (indicate cue type and reason): Performed 2x. Pt appears to be requiring more assist than last session. Pt requiring 2 attempts from bed. Stand to Sit: 4: Min assist;With upper extremity assist;To chair/3-in-1 Stand to Sit Details: Performed 2x. Cues for safe hand placement. Assist to control descent.  Stand Pivot Transfers: 4: Min assist Stand Pivot Transfer Details (indicate cue type and reason): Cues for sequencing, assist for stability.  Ambulation/Gait Ambulation/Gait: Yes Ambulation/Gait Assistance: 4: Min assist Ambulation/Gait Assistance Details (indicate cue type and reason): Very limited secondary to elevated pain in Rt. LE. Pt reports she feel she may fall if she tries to ambulate further.  Ambulation Distance (Feet): 17 Feet Assistive device: Rolling walker Gait Pattern: Step-to pattern;Decreased stride length;Decreased hip/knee flexion - right;Antalgic;Decreased weight shift to right;Shuffle Gait velocity: decreased gait speed Stairs: No  Posture/Postural Control Posture/Postural Control: No significant limitations Balance Balance Assessed: Yes Static Standing Balance Static Standing - Balance Support: No upper extremity supported;During functional activity (requires support through hips against sink. ) Static Standing - Level of Assistance: 4: Min assist Static Standing - Comment/# of Minutes: Pt frequently has to reach down to sink for stability. PT also providing min assist to provide stability.  Exercise  General Exercises - Lower Extremity Toe Raises: AROM;Both;10 reps;Seated Heel Raises: AROM;Both;10 reps;Seated End of Session PT -  End of Session Equipment Utilized During Treatment: Gait belt;Back brace Activity Tolerance: Patient limited by fatigue;Patient limited by pain Patient left: in chair;with call bell in reach;with family/visitor present Nurse Communication: Mobility status for transfers;Mobility  status for ambulation General Behavior During Session: [facility] for tasks performed Cognition: Impaired Cognitive Impairment: Pt slightly impaired cognition, slow to process. Occasionally making inconsistent comments. Pt still appears with appropriate safety awareness.  Sherrine Maples Cheek 03/25/2011, 4:10 PM  Sherie Don) Carleene Mains PT, DPT Acute Rehabilitation [phone]

## 2011-03-25 NOTE — Progress Notes (Signed)
CSW met with pt to provide bed offers. Pt was interested in [facility], CSW contacted facility regarding availability. [facility] was able to offer a bed, which pt accepted. CSW contacted facility regarding possible discharge. CSW will continue to follow to facilitate discharge to SNF.   Dede Query, MSW, [facility] [phone]

## 2011-03-26 DIAGNOSIS — M5137 Other intervertebral disc degeneration, lumbosacral region: Secondary | ICD-10-CM | POA: Diagnosis not present

## 2011-03-26 DIAGNOSIS — F329 Major depressive disorder, single episode, unspecified: Secondary | ICD-10-CM | POA: Diagnosis not present

## 2011-03-26 DIAGNOSIS — M4800 Spinal stenosis, site unspecified: Secondary | ICD-10-CM | POA: Diagnosis not present

## 2011-03-26 DIAGNOSIS — R5381 Other malaise: Secondary | ICD-10-CM | POA: Diagnosis not present

## 2011-03-26 DIAGNOSIS — M1990 Unspecified osteoarthritis, unspecified site: Secondary | ICD-10-CM | POA: Diagnosis not present

## 2011-03-26 DIAGNOSIS — M48061 Spinal stenosis, lumbar region without neurogenic claudication: Secondary | ICD-10-CM | POA: Diagnosis not present

## 2011-03-26 DIAGNOSIS — I2510 Atherosclerotic heart disease of native coronary artery without angina pectoris: Secondary | ICD-10-CM | POA: Diagnosis not present

## 2011-03-26 DIAGNOSIS — F411 Generalized anxiety disorder: Secondary | ICD-10-CM | POA: Diagnosis not present

## 2011-03-26 DIAGNOSIS — M549 Dorsalgia, unspecified: Secondary | ICD-10-CM | POA: Diagnosis not present

## 2011-03-26 DIAGNOSIS — Z5189 Encounter for other specified aftercare: Secondary | ICD-10-CM | POA: Diagnosis not present

## 2011-03-26 DIAGNOSIS — E119 Type 2 diabetes mellitus without complications: Secondary | ICD-10-CM | POA: Diagnosis not present

## 2011-03-26 DIAGNOSIS — I10 Essential (primary) hypertension: Secondary | ICD-10-CM | POA: Diagnosis not present

## 2011-03-26 DIAGNOSIS — F3289 Other specified depressive episodes: Secondary | ICD-10-CM | POA: Diagnosis not present

## 2011-03-26 LAB — GLUCOSE, CAPILLARY
Glucose-Capillary: 75 mg/dL (ref 70–99)
Glucose-Capillary: 84 mg/dL (ref 70–99)
Glucose-Capillary: 111 mg/dL — ABNORMAL HIGH (ref 70–99)

## 2011-03-26 NOTE — Progress Notes (Signed)
Physical Therapy Treatment Patient Details Name: Andrea Kirk MRN: [id number] DOB: 1940/11/06 Today's Date: 03/26/2011  PT Assessment/Plan  PT - Assessment/Plan Comments on Treatment Session: Pt. is making good progress towards goals. Initiated walking further in hallway today. BP was monitored with position changed. sit>stand bp dropped from 114/66 to 92/64, was assymptomatic, nurse notified. Pt. reported pain and spasms in R LE.  PT Plan: Discharge plan remains appropriate;Frequency remains appropriate PT Frequency: Min 5X/week Follow Up Recommendations: Skilled nursing facility Equipment Recommended: Defer to next venue PT Goals  Acute Rehab PT Goals PT Goal Formulation: With patient Time For Goal Achievement: 7 days PT Goal: Supine/Side to Sit - Progress: Progressing toward goal PT Goal: Sit to Stand - Progress: Progressing toward goal PT Goal: Stand to Sit - Progress: Progressing toward goal PT Goal: Ambulate - Progress: Progressing toward goal  PT Treatment Precautions/Restrictions  Precautions Precautions: Fall;Back Precaution Booklet Issued: Yes (comment) Precaution Comments: Pt. able to verbalize 3/3 back precautions by end of session. Required Braces or Orthoses: Yes Spinal Brace: Lumbar corset;Applied in sitting position Restrictions Weight Bearing Restrictions: No Mobility (including Balance) Bed Mobility Bed Mobility: Yes Right Sidelying to Sit: With rails;5: Supervision Right Sidelying to Sit Details (indicate cue type and reason): Increased time to come to sit. Pt. stated "let me do it on my own". cued once for back precaution. Sitting - Scoot to Edge of Bed: 5: Supervision Sitting - Scoot to [place] of Bed Details (indicate cue type and reason): Report spasm in R LE when in sitting position. Transfers Transfers: Yes Sit to Stand: 4: Min assist;With upper extremity assist;From bed Sit to Stand Details (indicate cue type and reason): cues for hand placemtent. Min  underarm assistance. Stand to Sit:  (Min guard (A)) Stand to Sit Details: pt. demonstrated proper technique and hand placement. Patient winced and reported it was due to pain in R LE that normally comes with sitting.  Ambulation/Gait Ambulation/Gait: Yes Ambulation/Gait Assistance:  (min guard A) Ambulation/Gait Assistance Details (indicate cue type and reason): Patient insisted on walking out into hall today. Amb ~75 feet. Demonstrated proper gait pattern with RW. Pt. turned in hallway with RW and was mindfull of back precaution. Ambulation Distance (Feet): 75 Feet Assistive device: Rolling walker Gait Pattern: Step-through pattern;Decreased stride length    End of Session PT - End of Session Equipment Utilized During Treatment: Gait belt;Back brace Activity Tolerance: Patient tolerated treatment well;Patient limited by pain;Patient limited by fatigue Patient left: in chair;with call bell in reach Nurse Communication: Mobility status for ambulation;Other (comment) (BP) General Behavior During Session: [facility] for tasks performed Cognition: [facility] for tasks performed  Ardyth Gal SPTA 03/26/2011, 12:35 PM

## 2011-03-26 NOTE — Progress Notes (Signed)
Hykeem Ojeda, PTA [phone] 03/26/2011  

## 2011-03-26 NOTE — Progress Notes (Signed)
Pt is ready for discharge today to [facility]. Facility has received discharge summary and is ready to accept pt. [facility] will provide transportation. Pt is agreeable to discharge plan. CSW is signing off as no further clinical social work needs identified.   Dede Query, MSW, [facility] [phone]

## 2011-03-26 NOTE — Discharge Summary (Signed)
Physician Discharge Summary  Patient ID: Andrea Kirk MRN: [id number] DOB/AGE: [birth year] 71 y.o.  Admit date: 03/21/2011 Discharge date: 03/26/2011  Admission Diagnoses: lumbar four-five stenosis, scoliosis, lumbar herniated disc, lumbar radiculopathy   Discharge Diagnoses: lumbar four-five stenosis, scoliosis, lumbar herniated disc, lumbar radiculopathy s/p POSTERIOR LUMBAR Decompression, PEEK TLIF interbody cage, bilateral pedicle screw fixation, posterolateral arthrodesis  FUSION 1 LEVEL (Bilateral) L4/5   Active Problems:  * No active hospital problems. *    Discharged Condition: fair  Hospital Course: This 71 y.o. Female was admitted for surgery with Dx of lumbar four-five stenosis, scoliosis, lumbar herniated disc, lumbar radiculopathy. She underwent uncomplicated surgery and PACU recovery on 03/21/11 and was transferred to [facility] for nursing care and physical therapy.  She has progressed slowly but steadily and is to be transferred to SNF at this time for continued rehab.   Consults: None  Significant Diagnostic Studies: radiology: X-Ray: intra-operative  Treatments: surgery: POSTERIOR LUMBAR Decompression, PEEK TLIF interbody cage, bilateral pedicle screw fixation, posterolateral arthrodesis  FUSION 1 LEVEL (Bilateral) L4/5   Discharge Exam: Blood pressure 96/59, pulse 71, temperature 98.5 F (36.9 C), temperature source Oral, resp. rate 17, height 5\' 5"  (1.651 m), weight 80.6 kg (177 lb 11.1 oz), SpO2 95.00%. Alert, conversant. Incision without erythema, swelling, or drainage. Good strength BLE. Pain well-controlled. Occasional mild lumbar spasms reported by pt.   Disposition: D/C to SNF  Discharge Orders    Future Appointments: Provider: Department: Dept Phone: Center:   06/13/2011 3:30 PM [facility] [phone] [facility]     Medication List  As of 03/26/2011 12:49 PM   ASK your doctor about these medications         acetaminophen 500 MG tablet   Commonly  known as: TYLENOL   Take 500 mg by mouth every 6 (six) hours as needed. For pain      aspirin 325 MG buffered tablet   Take 162.5 mg by mouth daily.      CALTRATE 600+D PO   Take 1 tablet by mouth daily.      carvedilol 25 MG tablet   Commonly known as: COREG   Take 25 mg by mouth 2 (two) times daily with a meal.      citalopram 20 MG tablet   Commonly known as: CELEXA   Take 20 mg by mouth daily.      clopidogrel 75 MG tablet   Commonly known as: PLAVIX   Take 75 mg by mouth daily.      ezetimibe 10 MG tablet   Commonly known as: ZETIA   Take 10 mg by mouth daily.      fish oil-omega-3 fatty acids 1000 MG capsule   Take 1 g by mouth daily.      glipiZIDE 10 MG 24 hr tablet   Commonly known as: GLUCOTROL XL   Take 10 mg by mouth daily.      HYDROcodone-acetaminophen 10-325 MG per tablet   Commonly known as: NORCO   Take 1-2 tablets by mouth every 6 (six) hours as needed. For pain      lisinopril 10 MG tablet   Commonly known as: PRINIVIL,ZESTRIL   Take 10 mg by mouth daily.      loratadine 10 MG tablet   Commonly known as: CLARITIN   Take 10 mg by mouth daily.      meclizine 25 MG tablet   Commonly known as: ANTIVERT   Take 25 mg by mouth 3 (three) times daily as needed.  For dizziness      metFORMIN 500 MG tablet   Commonly known as: GLUCOPHAGE   Take 1,000 mg by mouth 2 (two) times daily with a meal.      nitroGLYCERIN 0.4 MG SL tablet   Commonly known as: NITROSTAT   Place 0.4 mg under the tongue every 5 (five) minutes as needed.      rosuvastatin 20 MG tablet   Commonly known as: CRESTOR   Take 20 mg by mouth daily.      vitamin C 1000 MG tablet   Take 1,000 mg by mouth daily.             Signed: Georgiann Cocker 03/26/2011, 12:49 PM

## 2011-03-26 NOTE — Progress Notes (Signed)
Subjective: Patient reports "I'm doing better slowly"  Objective: Vital signs in last 24 hours: Temp:  [97.1 F (36.2 C)-98.5 F (36.9 C)] 98.5 F (36.9 C) (03/19 0600) Pulse Rate:  [68-75] 71  (03/19 0600) Resp:  [17-20] 17  (03/19 0600) BP: (96-123)/(58-86) 96/59 mmHg (03/19 0600) SpO2:  [93 %-98 %] 95 % (03/19 0600)  Intake/Output from previous day: 03/18 0701 - 03/19 0700 In: 180 [P.O.:180] Out: 350 [Urine:350] Intake/Output this shift:    Alert, conversant. Incision without erythema, swelling, or drainage. Good strength BLE. Pain well-controlled. Occasional mild lumbar spasms reported by pt.  Lab Results: No results found for this basename: WBC:2,HGB:2,HCT:2,PLT:2 in the last 72 hours BMET No results found for this basename: NA:2,K:2,CL:2,CO2:2,GLUCOSE:2,BUN:2,CREATININE:2,CALCIUM:2 in the last 72 hours  Studies/Results: No results found.  Assessment/Plan: Improving  LOS: 5 days  Per Dr. Venetia Maxon: d/c IV, d/c to SNF. Pt will call office for appt in 3-4 weeks.   Georgiann Cocker 03/26/2011, 12:45 PM

## 2011-03-27 DIAGNOSIS — E119 Type 2 diabetes mellitus without complications: Secondary | ICD-10-CM | POA: Diagnosis not present

## 2011-03-27 DIAGNOSIS — I10 Essential (primary) hypertension: Secondary | ICD-10-CM | POA: Diagnosis not present

## 2011-03-27 DIAGNOSIS — R5381 Other malaise: Secondary | ICD-10-CM | POA: Diagnosis not present

## 2011-03-27 NOTE — Progress Notes (Signed)
CARE MANAGEMENT NOTE 03/27/2011    Anticipated DC Date:  03/26/2011   Anticipated DC Plan:  SKILLED NURSING FACILITY  In-house referral  Clinical Social Worker      DC Planning Services  CM consult      Choice offered to / List presented to:             Status of service:  Completed, signed off  Discharge Disposition:  SKILLED NURSING FACILITY

## 2011-03-28 NOTE — Progress Notes (Signed)
LATE ENTRY 03/28/11 at 1738  Clinical Social Work Department CLINICAL SOCIAL WORK PLACEMENT NOTE 03/28/2011  Patient:  Andrea Kirk, Andrea Kirk  Account Number:  [id number] Admit date:  03/21/2011  Clinical Social Worker:  Truman Hayward, LCSW  Date/time:  03/22/2011 12:30 PM  Clinical Social Work is seeking post-discharge placement for this patient at the following level of care:      (*CSW will update this form in Epic as items are completed)   03/22/2011  Patient/family provided with [facility] Department of Clinical Social Work's list of facilities offering this level of care within the geographic area requested by the patient (or if unable, by the patient's family).  03/22/2011  Patient/family informed of their freedom to choose among providers that offer the needed level of care, that participate in Medicare, Medicaid or managed care program needed by the patient, have an available bed and are willing to accept the patient.  03/22/2011  Patient/family informed of [facility]' ownership interest in [facility], as well as of the fact that they are under no obligation to receive care at this facility.  PASARR submitted to [employer] on 03/22/2011 PASARR number received from [employer] on 03/25/2011  FL2 transmitted to all facilities in geographic area requested by pt/family on  03/22/2011 FL2 transmitted to all facilities within larger geographic area on   Patient informed that his/her managed care company has contracts with or will negotiate with  certain facilities, including the following:     Patient/family informed of bed offers received:  03/25/2011 Patient chooses bed at [facility] Physician recommends and patient chooses bed at  [facility]  [facility] to be transferred to [facility] on  03/26/2011 Patient to be transferred to facility by [facility]  The following physician request were entered in Epic:   Additional Comments:

## 2011-04-01 MED FILL — Heparin Sodium (Porcine) Inj 1000 Unit/ML: INTRAMUSCULAR | Qty: 30 | Status: AC

## 2011-04-01 MED FILL — Sodium Chloride IV Soln 0.9%: INTRAVENOUS | Qty: 1000 | Status: AC

## 2011-04-01 MED FILL — Sodium Chloride Irrigation Soln 0.9%: Qty: 3000 | Status: AC

## 2011-04-11 DIAGNOSIS — F3289 Other specified depressive episodes: Secondary | ICD-10-CM | POA: Diagnosis not present

## 2011-04-11 DIAGNOSIS — Z4789 Encounter for other orthopedic aftercare: Secondary | ICD-10-CM | POA: Diagnosis not present

## 2011-04-11 DIAGNOSIS — R269 Unspecified abnormalities of gait and mobility: Secondary | ICD-10-CM | POA: Diagnosis not present

## 2011-04-11 DIAGNOSIS — E119 Type 2 diabetes mellitus without complications: Secondary | ICD-10-CM | POA: Diagnosis not present

## 2011-04-11 DIAGNOSIS — F329 Major depressive disorder, single episode, unspecified: Secondary | ICD-10-CM | POA: Diagnosis not present

## 2011-04-11 DIAGNOSIS — M4120 Other idiopathic scoliosis, site unspecified: Secondary | ICD-10-CM | POA: Diagnosis not present

## 2011-04-11 DIAGNOSIS — M545 Low back pain, unspecified: Secondary | ICD-10-CM | POA: Diagnosis not present

## 2011-04-13 DIAGNOSIS — M545 Low back pain, unspecified: Secondary | ICD-10-CM | POA: Diagnosis not present

## 2011-04-13 DIAGNOSIS — F329 Major depressive disorder, single episode, unspecified: Secondary | ICD-10-CM | POA: Diagnosis not present

## 2011-04-13 DIAGNOSIS — R269 Unspecified abnormalities of gait and mobility: Secondary | ICD-10-CM | POA: Diagnosis not present

## 2011-04-13 DIAGNOSIS — F3289 Other specified depressive episodes: Secondary | ICD-10-CM | POA: Diagnosis not present

## 2011-04-13 DIAGNOSIS — M4120 Other idiopathic scoliosis, site unspecified: Secondary | ICD-10-CM | POA: Diagnosis not present

## 2011-04-13 DIAGNOSIS — Z4789 Encounter for other orthopedic aftercare: Secondary | ICD-10-CM | POA: Diagnosis not present

## 2011-04-13 DIAGNOSIS — E119 Type 2 diabetes mellitus without complications: Secondary | ICD-10-CM | POA: Diagnosis not present

## 2011-04-15 DIAGNOSIS — R269 Unspecified abnormalities of gait and mobility: Secondary | ICD-10-CM | POA: Diagnosis not present

## 2011-04-15 DIAGNOSIS — M545 Low back pain, unspecified: Secondary | ICD-10-CM | POA: Diagnosis not present

## 2011-04-15 DIAGNOSIS — E119 Type 2 diabetes mellitus without complications: Secondary | ICD-10-CM | POA: Diagnosis not present

## 2011-04-15 DIAGNOSIS — F3289 Other specified depressive episodes: Secondary | ICD-10-CM | POA: Diagnosis not present

## 2011-04-15 DIAGNOSIS — F329 Major depressive disorder, single episode, unspecified: Secondary | ICD-10-CM | POA: Diagnosis not present

## 2011-04-15 DIAGNOSIS — M4120 Other idiopathic scoliosis, site unspecified: Secondary | ICD-10-CM | POA: Diagnosis not present

## 2011-04-15 DIAGNOSIS — Z4789 Encounter for other orthopedic aftercare: Secondary | ICD-10-CM | POA: Diagnosis not present

## 2011-04-16 DIAGNOSIS — M4120 Other idiopathic scoliosis, site unspecified: Secondary | ICD-10-CM | POA: Diagnosis not present

## 2011-04-16 DIAGNOSIS — M545 Low back pain, unspecified: Secondary | ICD-10-CM | POA: Diagnosis not present

## 2011-04-16 DIAGNOSIS — F329 Major depressive disorder, single episode, unspecified: Secondary | ICD-10-CM | POA: Diagnosis not present

## 2011-04-16 DIAGNOSIS — R269 Unspecified abnormalities of gait and mobility: Secondary | ICD-10-CM | POA: Diagnosis not present

## 2011-04-16 DIAGNOSIS — Z4789 Encounter for other orthopedic aftercare: Secondary | ICD-10-CM | POA: Diagnosis not present

## 2011-04-16 DIAGNOSIS — E119 Type 2 diabetes mellitus without complications: Secondary | ICD-10-CM | POA: Diagnosis not present

## 2011-04-16 DIAGNOSIS — F3289 Other specified depressive episodes: Secondary | ICD-10-CM | POA: Diagnosis not present

## 2011-04-17 DIAGNOSIS — M47817 Spondylosis without myelopathy or radiculopathy, lumbosacral region: Secondary | ICD-10-CM | POA: Diagnosis not present

## 2011-04-19 DIAGNOSIS — R269 Unspecified abnormalities of gait and mobility: Secondary | ICD-10-CM | POA: Diagnosis not present

## 2011-04-19 DIAGNOSIS — M4120 Other idiopathic scoliosis, site unspecified: Secondary | ICD-10-CM | POA: Diagnosis not present

## 2011-04-19 DIAGNOSIS — F3289 Other specified depressive episodes: Secondary | ICD-10-CM | POA: Diagnosis not present

## 2011-04-19 DIAGNOSIS — F329 Major depressive disorder, single episode, unspecified: Secondary | ICD-10-CM | POA: Diagnosis not present

## 2011-04-19 DIAGNOSIS — E119 Type 2 diabetes mellitus without complications: Secondary | ICD-10-CM | POA: Diagnosis not present

## 2011-04-19 DIAGNOSIS — M545 Low back pain, unspecified: Secondary | ICD-10-CM | POA: Diagnosis not present

## 2011-04-19 DIAGNOSIS — Z4789 Encounter for other orthopedic aftercare: Secondary | ICD-10-CM | POA: Diagnosis not present

## 2011-04-20 DIAGNOSIS — Z4789 Encounter for other orthopedic aftercare: Secondary | ICD-10-CM | POA: Diagnosis not present

## 2011-04-20 DIAGNOSIS — M545 Low back pain, unspecified: Secondary | ICD-10-CM | POA: Diagnosis not present

## 2011-04-20 DIAGNOSIS — F3289 Other specified depressive episodes: Secondary | ICD-10-CM | POA: Diagnosis not present

## 2011-04-20 DIAGNOSIS — M4120 Other idiopathic scoliosis, site unspecified: Secondary | ICD-10-CM | POA: Diagnosis not present

## 2011-04-20 DIAGNOSIS — E119 Type 2 diabetes mellitus without complications: Secondary | ICD-10-CM | POA: Diagnosis not present

## 2011-04-20 DIAGNOSIS — F329 Major depressive disorder, single episode, unspecified: Secondary | ICD-10-CM | POA: Diagnosis not present

## 2011-04-20 DIAGNOSIS — R269 Unspecified abnormalities of gait and mobility: Secondary | ICD-10-CM | POA: Diagnosis not present

## 2011-04-22 DIAGNOSIS — F3289 Other specified depressive episodes: Secondary | ICD-10-CM | POA: Diagnosis not present

## 2011-04-22 DIAGNOSIS — Z4789 Encounter for other orthopedic aftercare: Secondary | ICD-10-CM | POA: Diagnosis not present

## 2011-04-22 DIAGNOSIS — R269 Unspecified abnormalities of gait and mobility: Secondary | ICD-10-CM | POA: Diagnosis not present

## 2011-04-22 DIAGNOSIS — M545 Low back pain, unspecified: Secondary | ICD-10-CM | POA: Diagnosis not present

## 2011-04-22 DIAGNOSIS — M4120 Other idiopathic scoliosis, site unspecified: Secondary | ICD-10-CM | POA: Diagnosis not present

## 2011-04-22 DIAGNOSIS — F329 Major depressive disorder, single episode, unspecified: Secondary | ICD-10-CM | POA: Diagnosis not present

## 2011-04-22 DIAGNOSIS — E119 Type 2 diabetes mellitus without complications: Secondary | ICD-10-CM | POA: Diagnosis not present

## 2011-04-23 DIAGNOSIS — D500 Iron deficiency anemia secondary to blood loss (chronic): Secondary | ICD-10-CM | POA: Diagnosis not present

## 2011-04-23 DIAGNOSIS — I10 Essential (primary) hypertension: Secondary | ICD-10-CM | POA: Diagnosis not present

## 2011-04-23 DIAGNOSIS — E7800 Pure hypercholesterolemia, unspecified: Secondary | ICD-10-CM | POA: Diagnosis not present

## 2011-04-23 DIAGNOSIS — E119 Type 2 diabetes mellitus without complications: Secondary | ICD-10-CM | POA: Diagnosis not present

## 2011-04-25 DIAGNOSIS — R269 Unspecified abnormalities of gait and mobility: Secondary | ICD-10-CM | POA: Diagnosis not present

## 2011-04-25 DIAGNOSIS — M4120 Other idiopathic scoliosis, site unspecified: Secondary | ICD-10-CM | POA: Diagnosis not present

## 2011-04-25 DIAGNOSIS — F329 Major depressive disorder, single episode, unspecified: Secondary | ICD-10-CM | POA: Diagnosis not present

## 2011-04-25 DIAGNOSIS — E119 Type 2 diabetes mellitus without complications: Secondary | ICD-10-CM | POA: Diagnosis not present

## 2011-04-25 DIAGNOSIS — F3289 Other specified depressive episodes: Secondary | ICD-10-CM | POA: Diagnosis not present

## 2011-04-25 DIAGNOSIS — M545 Low back pain, unspecified: Secondary | ICD-10-CM | POA: Diagnosis not present

## 2011-04-25 DIAGNOSIS — Z4789 Encounter for other orthopedic aftercare: Secondary | ICD-10-CM | POA: Diagnosis not present

## 2011-05-01 DIAGNOSIS — F3289 Other specified depressive episodes: Secondary | ICD-10-CM | POA: Diagnosis not present

## 2011-05-01 DIAGNOSIS — F329 Major depressive disorder, single episode, unspecified: Secondary | ICD-10-CM | POA: Diagnosis not present

## 2011-05-01 DIAGNOSIS — M4120 Other idiopathic scoliosis, site unspecified: Secondary | ICD-10-CM | POA: Diagnosis not present

## 2011-05-01 DIAGNOSIS — M545 Low back pain, unspecified: Secondary | ICD-10-CM | POA: Diagnosis not present

## 2011-05-01 DIAGNOSIS — R269 Unspecified abnormalities of gait and mobility: Secondary | ICD-10-CM | POA: Diagnosis not present

## 2011-05-01 DIAGNOSIS — E119 Type 2 diabetes mellitus without complications: Secondary | ICD-10-CM | POA: Diagnosis not present

## 2011-05-01 DIAGNOSIS — Z4789 Encounter for other orthopedic aftercare: Secondary | ICD-10-CM | POA: Diagnosis not present

## 2011-05-04 DIAGNOSIS — R269 Unspecified abnormalities of gait and mobility: Secondary | ICD-10-CM | POA: Diagnosis not present

## 2011-05-04 DIAGNOSIS — F3289 Other specified depressive episodes: Secondary | ICD-10-CM | POA: Diagnosis not present

## 2011-05-04 DIAGNOSIS — M545 Low back pain, unspecified: Secondary | ICD-10-CM | POA: Diagnosis not present

## 2011-05-04 DIAGNOSIS — Z4789 Encounter for other orthopedic aftercare: Secondary | ICD-10-CM | POA: Diagnosis not present

## 2011-05-04 DIAGNOSIS — F329 Major depressive disorder, single episode, unspecified: Secondary | ICD-10-CM | POA: Diagnosis not present

## 2011-05-04 DIAGNOSIS — M4120 Other idiopathic scoliosis, site unspecified: Secondary | ICD-10-CM | POA: Diagnosis not present

## 2011-05-04 DIAGNOSIS — E119 Type 2 diabetes mellitus without complications: Secondary | ICD-10-CM | POA: Diagnosis not present

## 2011-05-20 ENCOUNTER — Emergency Department (HOSPITAL_COMMUNITY): Payer: Medicare Other

## 2011-05-20 ENCOUNTER — Encounter (HOSPITAL_COMMUNITY): Payer: Self-pay | Admitting: Emergency Medicine

## 2011-05-20 ENCOUNTER — Emergency Department (HOSPITAL_COMMUNITY)
Admission: EM | Admit: 2011-05-20 | Discharge: 2011-05-20 | Disposition: A | Discharge status: Home / Self Care | Payer: Medicare Other | Attending: Emergency Medicine | Admitting: Emergency Medicine

## 2011-05-20 DIAGNOSIS — E785 Hyperlipidemia, unspecified: Secondary | ICD-10-CM | POA: Diagnosis not present

## 2011-05-20 DIAGNOSIS — N281 Cyst of kidney, acquired: Secondary | ICD-10-CM | POA: Diagnosis not present

## 2011-05-20 DIAGNOSIS — E119 Type 2 diabetes mellitus without complications: Secondary | ICD-10-CM | POA: Insufficient documentation

## 2011-05-20 DIAGNOSIS — R300 Dysuria: Secondary | ICD-10-CM | POA: Diagnosis not present

## 2011-05-20 DIAGNOSIS — K2970 Gastritis, unspecified, without bleeding: Secondary | ICD-10-CM | POA: Diagnosis not present

## 2011-05-20 DIAGNOSIS — R110 Nausea: Secondary | ICD-10-CM | POA: Diagnosis not present

## 2011-05-20 DIAGNOSIS — R630 Anorexia: Secondary | ICD-10-CM | POA: Insufficient documentation

## 2011-05-20 DIAGNOSIS — F411 Generalized anxiety disorder: Secondary | ICD-10-CM | POA: Diagnosis not present

## 2011-05-20 DIAGNOSIS — R509 Fever, unspecified: Secondary | ICD-10-CM | POA: Diagnosis not present

## 2011-05-20 DIAGNOSIS — M25529 Pain in unspecified elbow: Secondary | ICD-10-CM | POA: Diagnosis not present

## 2011-05-20 DIAGNOSIS — N390 Urinary tract infection, site not specified: Secondary | ICD-10-CM | POA: Diagnosis not present

## 2011-05-20 DIAGNOSIS — I10 Essential (primary) hypertension: Secondary | ICD-10-CM | POA: Diagnosis not present

## 2011-05-20 DIAGNOSIS — Z79899 Other long term (current) drug therapy: Secondary | ICD-10-CM | POA: Diagnosis not present

## 2011-05-20 DIAGNOSIS — K449 Diaphragmatic hernia without obstruction or gangrene: Secondary | ICD-10-CM | POA: Diagnosis not present

## 2011-05-20 DIAGNOSIS — R1031 Right lower quadrant pain: Secondary | ICD-10-CM | POA: Diagnosis not present

## 2011-05-20 DIAGNOSIS — R109 Unspecified abdominal pain: Secondary | ICD-10-CM | POA: Diagnosis not present

## 2011-05-20 LAB — URINE MICROSCOPIC-ADD ON

## 2011-05-20 LAB — CBC
HCT: 35.6 % — ABNORMAL LOW (ref 36.0–46.0)
Hemoglobin: 12 g/dL (ref 12.0–15.0)
MCH: 31.8 pg (ref 26.0–34.0)
MCHC: 33.7 g/dL (ref 30.0–36.0)
MCV: 94.4 fL (ref 78.0–100.0)
Platelets: 179 10*3/uL (ref 150–400)
RBC: 3.77 MIL/uL — ABNORMAL LOW (ref 3.87–5.11)
RDW: 12.5 % (ref 11.5–15.5)
WBC: 6.7 10*3/uL (ref 4.0–10.5)

## 2011-05-20 LAB — COMPREHENSIVE METABOLIC PANEL
ALT: 12 U/L (ref 0–35)
AST: 17 U/L (ref 0–37)
Albumin: 4.6 g/dL (ref 3.5–5.2)
Alkaline Phosphatase: 72 U/L (ref 39–117)
BUN: 17 mg/dL (ref 6–23)
CO2: 23 mEq/L (ref 19–32)
Calcium: 10 mg/dL (ref 8.4–10.5)
Chloride: 106 mEq/L (ref 96–112)
Creatinine, Ser: 0.63 mg/dL (ref 0.50–1.10)
GFR calc Af Amer: >90 mL/min (ref >90)
GFR calc non Af Amer: 89 mL/min — ABNORMAL LOW (ref >90)
Glucose, Bld: 182 mg/dL — ABNORMAL HIGH (ref 70–99)
Potassium: 3.8 mEq/L (ref 3.5–5.1)
Sodium: 141 mEq/L (ref 135–145)
Total Bilirubin: 0.3 mg/dL (ref 0.3–1.2)
Total Protein: 7.6 g/dL (ref 6.0–8.3)

## 2011-05-20 LAB — URINALYSIS, ROUTINE W REFLEX MICROSCOPIC
Bilirubin Urine: NEGATIVE
Glucose, UA: NEGATIVE mg/dL
Hgb urine dipstick: NEGATIVE
Nitrite: NEGATIVE
Protein, ur: 30 mg/dL — AB
Specific Gravity, Urine: 1.028 (ref 1.005–1.030)
Urobilinogen, UA: 1 mg/dL (ref 0.0–1.0)
pH: 6.5 (ref 5.0–8.0)

## 2011-05-20 LAB — DIFFERENTIAL
Basophils Absolute: 0 10*3/uL (ref 0.0–0.1)
Basophils Relative: 1 % (ref 0–1)
Eosinophils Absolute: 0 10*3/uL (ref 0.0–0.7)
Eosinophils Relative: 1 % (ref 0–5)
Lymphocytes Relative: 49 % — ABNORMAL HIGH (ref 12–46)
Lymphs Abs: 3.3 10*3/uL (ref 0.7–4.0)
Monocytes Absolute: 0.7 10*3/uL (ref 0.1–1.0)
Monocytes Relative: 10 % (ref 3–12)
Neutro Abs: 2.7 10*3/uL (ref 1.7–7.7)
Neutrophils Relative %: 40 % — ABNORMAL LOW (ref 43–77)

## 2011-05-20 LAB — LIPASE, BLOOD: Lipase: 54 U/L (ref 11–59)

## 2011-05-20 MED ORDER — SULFAMETHOXAZOLE-TRIMETHOPRIM 800-160 MG PO TABS: 1.0000 | ORAL_TABLET | Freq: Two times a day (BID) | ORAL | Status: AC

## 2011-05-20 MED ORDER — SULFAMETHOXAZOLE-TMP DS 800-160 MG PO TABS
1.0000 | ORAL_TABLET | Freq: Once | ORAL | Status: AC
  Administered 2011-05-20: 1 via ORAL
  Filled 2011-05-20: qty 1

## 2011-05-20 MED ORDER — IOHEXOL 300 MG/ML  SOLN
100.0000 mL | Freq: Once | INTRAMUSCULAR | Status: AC | PRN
  Administered 2011-05-20: 100 mL via INTRAVENOUS

## 2011-05-20 MED ORDER — SODIUM CHLORIDE 0.9 % IV BOLUS (SEPSIS)
1000.0000 mL | Freq: Once | INTRAVENOUS | Status: AC
  Administered 2011-05-20: 1000 mL via INTRAVENOUS

## 2011-05-20 NOTE — ED Notes (Signed)
ZOX:[id number]<AV> Expected date:05/20/11<BR> Expected time:<BR> Means of arrival:<BR> Comments:<BR> EMS [facility] GC - abd pain

## 2011-05-20 NOTE — Discharge Instructions (Signed)

## 2011-05-20 NOTE — ED Provider Notes (Signed)
History     CSN: [id number]  Arrival date & time 05/20/11  1620   First MD Initiated Contact with Patient 05/20/11 1708      Chief Complaint  Patient presents with  . Abdominal Pain    (Consider location/radiation/quality/duration/timing/severity/associated sxs/prior treatment) HPI The patient presents with 2 days of the elbow pain, nausea, anorexia.  She notes that over the past 2 days the symptoms have been progressive, after starting gradually.  The pain is focally in her right lower quadrant, with some radiation to the left lower quadrant.  She has not taken any medication for relief, though she is currently taking analgesics for back surgery that occurred one month ago.  She denies any vomiting or diarrhea, but notes that she is entirely anorexic and the nausea is incapacitating. Past Medical History  Diagnosis Date  . Hypertension   . Hyperlipidemia   . Anxiety   . Diabetes mellitus   . Angina     Past Surgical History  Procedure Date  . Coronary artery bypass graft 1997  . Eye surgery     CORNEA TRANSPLANT RT  . Bypass graft     No family history on file.  History  Substance Use Topics  . Smoking status: Never Smoker   . Smokeless tobacco: Not on file  . Alcohol Use: No    OB History    Grav Para Term Preterm Abortions TAB SAB Ect Mult Living                  Review of Systems  Constitutional:       HPI  HENT:       HPI otherwise negative  Eyes: Negative.   Respiratory:       HPI, otherwise negative  Cardiovascular:       HPI, otherwise nmegative  Gastrointestinal: Negative for vomiting.  Genitourinary:       HPI, otherwise negative  Musculoskeletal:       HPI, otherwise negative  Skin: Negative.   Neurological: Negative for syncope.    Allergies  Codeine; Latex; and Morphine and related  Home Medications   Current Outpatient Rx  Name Route Sig Dispense Refill  . ACETAMINOPHEN 500 MG PO TABS Oral Take 500 mg by mouth every 6 (six)  hours as needed. For pain    . VITAMIN C 1000 MG PO TABS Oral Take 1,000 mg by mouth daily.    . ASPIRIN BUFFERED 325 MG PO TABS Oral Take 162.5 mg by mouth daily.    Marland Kitchen CALTRATE 600+D PO Oral Take 1 tablet by mouth daily.    Marland Kitchen CARVEDILOL 25 MG PO TABS Oral Take 25 mg by mouth 2 (two) times daily with a meal.    . CITALOPRAM HYDROBROMIDE 20 MG PO TABS Oral Take 20 mg by mouth daily.    Marland Kitchen CLOPIDOGREL BISULFATE 75 MG PO TABS Oral Take 75 mg by mouth daily.    Marland Kitchen EZETIMIBE 10 MG PO TABS Oral Take 10 mg by mouth daily.    . OMEGA-3 FATTY ACIDS 1000 MG PO CAPS Oral Take 1 g by mouth daily.    Marland Kitchen GLIPIZIDE ER 10 MG PO TB24 Oral Take 10 mg by mouth daily.    Marland Kitchen HYDROCODONE-ACETAMINOPHEN 10-325 MG PO TABS Oral Take 1 tablet by mouth every 4 (four) hours as needed. For pain    . IBUPROFEN 200 MG PO TABS Oral Take 600 mg by mouth once.    Marland Kitchen LISINOPRIL 10 MG PO TABS Oral  Take 10 mg by mouth daily.    Marland Kitchen LORATADINE 10 MG PO TABS Oral Take 10 mg by mouth every evening.     Marland Kitchen MECLIZINE HCL 25 MG PO TABS Oral Take 25 mg by mouth 3 (three) times daily as needed. For dizziness    . METFORMIN HCL 500 MG PO TABS Oral Take 1,000 mg by mouth 2 (two) times daily with a meal.    . NITROGLYCERIN 0.4 MG SL SUBL Sublingual Place 0.4 mg under the tongue every 5 (five) minutes as needed. Chest pain.    Marland Kitchen PROMETHAZINE HCL 12.5 MG PO TABS Oral Take 12.5-25 mg by mouth every 8 (eight) hours as needed. nausea    . ROSUVASTATIN CALCIUM 20 MG PO TABS Oral Take 20 mg by mouth at bedtime.       BP 145/83  Pulse 63  Temp 98.5 F (36.9 C)  Resp 23  SpO2 99%  Physical Exam  Nursing note and vitals reviewed. Constitutional: She is oriented to person, place, and time. She appears well-developed and well-nourished. No distress.  HENT:  Head: Normocephalic and atraumatic.  Eyes: Conjunctivae and EOM are normal.  Cardiovascular: Normal rate and regular rhythm.   Pulmonary/Chest: Effort normal and breath sounds normal. No stridor.  No respiratory distress.  Abdominal: Soft. Normal appearance. She exhibits no distension. There is tenderness in the right lower quadrant and suprapubic area. There is no rigidity, no rebound, no guarding and no CVA tenderness.  Musculoskeletal: She exhibits no edema.  Neurological: She is alert and oriented to person, place, and time. No cranial nerve deficit.  Skin: Skin is warm and dry.  Psychiatric: She has a normal mood and affect.    ED Course  Procedures (including critical care time)  Labs Reviewed  CBC - Abnormal; Notable for the following:    RBC 3.77 (*)    HCT 35.6 (*)    All other components within normal limits  DIFFERENTIAL - Abnormal; Notable for the following:    Neutrophils Relative 40 (*)    Lymphocytes Relative 49 (*)    All other components within normal limits  COMPREHENSIVE METABOLIC PANEL  LIPASE, BLOOD  URINALYSIS, ROUTINE W REFLEX MICROSCOPIC   No results found.   No diagnosis found.  Cardiac 60 sinus rhythm normal Pulse oximetry 100% room air normal  CT did not demonstrate acute appendicitis MDM  This female presents with new abdominal pain, anorexia, and on exam has right upper quadrant tenderness with guarding.  Given this presentation her some suspicion of acute appendicitis.  The patient's CT did not demonstrate findings consistent with appendicitis.  The patient's pain improved with fluids and analgesics.  The patient's labs are notable for evidence of a urinary tract infection.  The patient was treated and will be discharged in stable condition to follow up with her primary care physician.        Gerhard Munch, MD 05/20/11 2036

## 2011-05-20 NOTE — ED Notes (Signed)
Pt started having abdominal pain on Saturday that is intermittent. LRQ. Went to see doctor today and they stated they would scan her tomorrow if she was still hurting but did not want to wait so she called 911. Hasn't taken her medications for 2 days. CBG 212. Nausea but no diarrhea.

## 2011-05-28 DIAGNOSIS — R6889 Other general symptoms and signs: Secondary | ICD-10-CM | POA: Diagnosis not present

## 2011-05-29 DIAGNOSIS — M47817 Spondylosis without myelopathy or radiculopathy, lumbosacral region: Secondary | ICD-10-CM | POA: Diagnosis not present

## 2011-06-13 ENCOUNTER — Other Ambulatory Visit: Payer: Medicare Other

## 2011-06-19 ENCOUNTER — Encounter: Payer: Self-pay | Admitting: Neurosurgery

## 2011-06-20 ENCOUNTER — Ambulatory Visit: Payer: Medicare Other | Admitting: Neurosurgery

## 2011-06-20 ENCOUNTER — Other Ambulatory Visit: Payer: Medicare Other

## 2011-06-27 ENCOUNTER — Encounter: Payer: Self-pay | Admitting: Neurosurgery

## 2011-06-28 ENCOUNTER — Ambulatory Visit: Payer: Medicare Other | Admitting: Neurosurgery

## 2011-06-28 ENCOUNTER — Other Ambulatory Visit: Payer: Medicare Other

## 2011-07-01 DIAGNOSIS — M47817 Spondylosis without myelopathy or radiculopathy, lumbosacral region: Secondary | ICD-10-CM | POA: Diagnosis not present

## 2011-07-10 DIAGNOSIS — M545 Low back pain, unspecified: Secondary | ICD-10-CM | POA: Diagnosis not present

## 2011-07-15 DIAGNOSIS — I10 Essential (primary) hypertension: Secondary | ICD-10-CM | POA: Diagnosis not present

## 2011-07-15 DIAGNOSIS — R6889 Other general symptoms and signs: Secondary | ICD-10-CM | POA: Diagnosis not present

## 2011-07-15 DIAGNOSIS — IMO0001 Reserved for inherently not codable concepts without codable children: Secondary | ICD-10-CM | POA: Diagnosis not present

## 2011-07-17 DIAGNOSIS — I10 Essential (primary) hypertension: Secondary | ICD-10-CM | POA: Diagnosis not present

## 2011-07-17 DIAGNOSIS — F329 Major depressive disorder, single episode, unspecified: Secondary | ICD-10-CM | POA: Diagnosis not present

## 2011-07-17 DIAGNOSIS — E7800 Pure hypercholesterolemia, unspecified: Secondary | ICD-10-CM | POA: Diagnosis not present

## 2011-07-17 DIAGNOSIS — E119 Type 2 diabetes mellitus without complications: Secondary | ICD-10-CM | POA: Diagnosis not present

## 2011-07-18 ENCOUNTER — Encounter: Payer: Self-pay | Admitting: Neurosurgery

## 2011-07-18 DIAGNOSIS — M545 Low back pain, unspecified: Secondary | ICD-10-CM | POA: Diagnosis not present

## 2011-07-19 ENCOUNTER — Encounter: Payer: Self-pay | Admitting: Neurosurgery

## 2011-07-19 ENCOUNTER — Ambulatory Visit (INDEPENDENT_AMBULATORY_CARE_PROVIDER_SITE_OTHER): Payer: Medicare Other | Admitting: Neurosurgery

## 2011-07-19 ENCOUNTER — Other Ambulatory Visit (INDEPENDENT_AMBULATORY_CARE_PROVIDER_SITE_OTHER): Payer: Medicare Other | Admitting: *Deleted

## 2011-07-19 VITALS — BP 132/66 | HR 60 | Resp 18 | Ht 63.0 in | Wt 167.9 lb

## 2011-07-19 DIAGNOSIS — I6529 Occlusion and stenosis of unspecified carotid artery: Secondary | ICD-10-CM

## 2011-07-19 HISTORY — DX: Occlusion and stenosis of unspecified carotid artery: I65.29

## 2011-07-19 NOTE — Progress Notes (Signed)
[facility] [place] Carotid Office Note  CC: Six-month carotid followup for surveillance Referring Physician: Early  History of Present Illness: 71 year old female patient of Dr. Arbie Cookey followed for known moderate to severe bilateral carotid stenosis. Patient denies any signs or symptoms of CVA, TIA, amaurosis fugax or any neural deficit. The patient also denies any new medical diagnoses however she has had recent lumbar spine surgery with Dr. Maeola Harman.  Past Medical History  Diagnosis Date  . Hypertension   . Hyperlipidemia   . Anxiety   . Diabetes mellitus   . Angina     ROS: [x]  Positive   [ ]  Denies    General: [ ]  Weight loss, [ ]  Fever, [ ]  chills Neurologic: [ ]  Dizziness, [ ]  Blackouts, [ ]  Seizure [ ]  Stroke, [ ]  "Mini stroke", [ ]  Slurred speech, [ ]  Temporary blindness; [ ]  weakness in arms or legs, [ ]  Hoarseness Cardiac: [ ]  Chest pain/pressure, [ ]  Shortness of breath at rest [ ]  Shortness of breath with exertion, [ ]  Atrial fibrillation or irregular heartbeat Vascular: [ ]  Pain in legs with walking, [ ]  Pain in legs at rest, [ ]  Pain in legs at night,  [ ]  Non-healing ulcer, [ ]  Blood clot in vein/DVT,   Pulmonary: [ ]  Home oxygen, [ ]  Productive cough, [ ]  Coughing up blood, [ ]  Asthma,  [ ]  Wheezing Musculoskeletal:  [ ]  Arthritis, [ ]  Low back pain, [ ]  Joint pain Hematologic: [ ]  Easy Bruising, [ ]  Anemia; [ ]  Hepatitis Gastrointestinal: [ ]  Blood in stool, [ ]  Gastroesophageal Reflux/heartburn, [ ]  Trouble swallowing Urinary: [ ]  chronic Kidney disease, [ ]  on HD - [ ]  MWF or [ ]  TTHS, [ ]  Burning with urination, [ ]  Difficulty urinating Skin: [ ]  Rashes, [ ]  Wounds Psychological: [ ]  Anxiety, [ ]  Depression   Social History History  Substance Use Topics  . Smoking status: Never Smoker   . Smokeless tobacco: Not on file  . Alcohol Use: No    Family History Family History  Problem Relation Age of Onset  . Heart attack Father     . Heart disease Brother     Allergies  Allergen Reactions  . Codeine Other (See Comments)    Abnormal behavior  . Latex Other (See Comments)    Teras skin  . Morphine And Related     Affects BP and blood sugar.    Current Outpatient Prescriptions  Medication Sig Dispense Refill  . acetaminophen (TYLENOL) 500 MG tablet Take 500 mg by mouth every 6 (six) hours as needed. For pain      . Ascorbic Acid (VITAMIN C) 1000 MG tablet Take 1,000 mg by mouth daily.      Marland Kitchen aspirin 81 MG tablet Take 81 mg by mouth daily.      . Calcium Carbonate-Vitamin D (CALTRATE 600+D PO) Take 1 tablet by mouth daily.      . carvedilol (COREG) 25 MG tablet Take 25 mg by mouth 2 (two) times daily with a meal.      . citalopram (CELEXA) 20 MG tablet Take 20 mg by mouth daily.      . clopidogrel (PLAVIX) 75 MG tablet Take 75 mg by mouth daily.      . fish oil-omega-3 fatty acids 1000 MG capsule Take 1 g by mouth daily.      Marland Kitchen glipiZIDE (GLUCOTROL XL) 10 MG 24 hr tablet Take 10 mg by  mouth daily.      Marland Kitchen HYDROcodone-acetaminophen (NORCO) 10-325 MG per tablet Take 1 tablet by mouth every 4 (four) hours as needed. For pain      . ibuprofen (ADVIL,MOTRIN) 200 MG tablet Take 600 mg by mouth once.      Marland Kitchen lisinopril (PRINIVIL,ZESTRIL) 10 MG tablet Take 10 mg by mouth daily.      Marland Kitchen loratadine (CLARITIN) 10 MG tablet Take 10 mg by mouth every evening.       . meclizine (ANTIVERT) 25 MG tablet Take 25 mg by mouth 3 (three) times daily as needed. For dizziness      . metFORMIN (GLUCOPHAGE) 500 MG tablet Take 1,000 mg by mouth 2 (two) times daily with a meal.      . nitroGLYCERIN (NITROSTAT) 0.4 MG SL tablet Place 0.4 mg under the tongue every 5 (five) minutes as needed. Chest pain.      . rosuvastatin (CRESTOR) 20 MG tablet Take 20 mg by mouth at bedtime.       Marland Kitchen aspirin 325 MG buffered tablet Take 162.5 mg by mouth daily.      Marland Kitchen ezetimibe (ZETIA) 10 MG tablet Take 10 mg by mouth daily.      . promethazine (PHENERGAN)  12.5 MG tablet Take 12.5-25 mg by mouth every 8 (eight) hours as needed. nausea        Physical Examination  Filed Vitals:   07/19/11 1544  BP: 132/66  Pulse: 60  Resp:     Body mass index is 29.74 kg/(m^2).  General:  WDWN in NAD Gait: Normal HEENT: WNL Eyes: Pupils equal Pulmonary: normal non-labored breathing , without Rales, rhonchi,  wheezing Cardiac: RRR, without  Murmurs, rubs or gallops; Abdomen: soft, NT, no masses Skin: no rashes, ulcers noted  Vascular Exam Pulses: 3+ radial pulses bilaterally Carotid bruits are heard bilaterally Extremities without ischemic changes, no Gangrene , no cellulitis; no open wounds;  Musculoskeletal: no muscle wasting or atrophy   Neurologic: A&O X 3; Appropriate Affect ; SENSATION: normal; MOTOR FUNCTION:  moving all extremities equally. Speech is fluent/normal  Non-Invasive Vascular Imaging CAROTID DUPLEX 07/19/2011  Right ICA 60 - 79 % stenosis Left ICA 60 - 79 % stenosis   ASSESSMENT/PLAN: The patient return in 6 months for repeat carotid duplex and be seen in my clinic. Her questions were encouraged and answered. The patient knows the signs and symptoms of CVA and knows to report to the nearest emergency department should that occur.  Lauree Chandler ANP   Clinic MD: The

## 2011-07-23 DIAGNOSIS — M545 Low back pain, unspecified: Secondary | ICD-10-CM | POA: Diagnosis not present

## 2011-07-23 NOTE — Procedures (Unsigned)
CAROTID DUPLEX EXAM  INDICATION:  Follow up carotid disease.  HISTORY: Diabetes:  Yes. Cardiac:  Yes. Hypertension:  Yes. Smoking:  No. Previous Surgery:  No. CV History: Amaurosis Fugax No, Paresthesias No, Hemiparesis No                                      RIGHT             LEFT Brachial systolic pressure:         138               118 Brachial Doppler waveforms:         WNL               WNL Vertebral direction of flow:        Abnormally antegrade                Abnormally antegrade DUPLEX VELOCITIES (cm/sec) CCA peak systolic                   85                68 ECA peak systolic                   122               259 ICA peak systolic                   217               253 ICA end diastolic                   75                97 PLAQUE MORPHOLOGY:                  Heterogeneous     Calcific PLAQUE AMOUNT:                      Moderate          Moderate to severe PLAQUE LOCATION:                    CCA/ICA           CCA/ECA/ICA  IMPRESSION: 1. Technically difficult study due to calcific shadow. 2. 60-79% bilateral internal carotid artery stenosis; however,     velocities on the left may be under-estimated due to calcific     shadow. 3. Left external carotid artery stenosis observed. 4. Bilateral vertebral arteries are abnormally antegrade. 5. Right subclavian artery is within normal limits. 6. 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  ___________________________________________ Larina Earthly, M.D.  LT/MEDQ  D:  07/19/2011  T:  07/19/2011  Job:  [id number]

## 2011-07-29 DIAGNOSIS — M545 Low back pain, unspecified: Secondary | ICD-10-CM | POA: Diagnosis not present

## 2011-08-05 DIAGNOSIS — M545 Low back pain, unspecified: Secondary | ICD-10-CM | POA: Diagnosis not present

## 2011-08-14 DIAGNOSIS — M545 Low back pain, unspecified: Secondary | ICD-10-CM | POA: Diagnosis not present

## 2011-09-18 DIAGNOSIS — M47817 Spondylosis without myelopathy or radiculopathy, lumbosacral region: Secondary | ICD-10-CM | POA: Diagnosis not present

## 2011-10-01 DIAGNOSIS — G2581 Restless legs syndrome: Secondary | ICD-10-CM | POA: Diagnosis not present

## 2011-10-03 DIAGNOSIS — B9789 Other viral agents as the cause of diseases classified elsewhere: Secondary | ICD-10-CM | POA: Diagnosis not present

## 2011-10-03 DIAGNOSIS — R110 Nausea: Secondary | ICD-10-CM | POA: Diagnosis not present

## 2011-10-14 DIAGNOSIS — R141 Gas pain: Secondary | ICD-10-CM | POA: Diagnosis not present

## 2011-10-14 DIAGNOSIS — D649 Anemia, unspecified: Secondary | ICD-10-CM | POA: Diagnosis not present

## 2011-10-14 DIAGNOSIS — R143 Flatulence: Secondary | ICD-10-CM | POA: Diagnosis not present

## 2011-10-14 DIAGNOSIS — R142 Eructation: Secondary | ICD-10-CM | POA: Diagnosis not present

## 2011-11-19 DIAGNOSIS — E7800 Pure hypercholesterolemia, unspecified: Secondary | ICD-10-CM | POA: Diagnosis not present

## 2011-11-19 DIAGNOSIS — E119 Type 2 diabetes mellitus without complications: Secondary | ICD-10-CM | POA: Diagnosis not present

## 2011-12-26 DIAGNOSIS — E119 Type 2 diabetes mellitus without complications: Secondary | ICD-10-CM | POA: Diagnosis not present

## 2011-12-26 DIAGNOSIS — H18609 Keratoconus, unspecified, unspecified eye: Secondary | ICD-10-CM | POA: Diagnosis not present

## 2011-12-26 DIAGNOSIS — H52209 Unspecified astigmatism, unspecified eye: Secondary | ICD-10-CM | POA: Diagnosis not present

## 2011-12-26 DIAGNOSIS — H259 Unspecified age-related cataract: Secondary | ICD-10-CM | POA: Diagnosis not present

## 2012-01-06 DIAGNOSIS — K2970 Gastritis, unspecified, without bleeding: Secondary | ICD-10-CM | POA: Diagnosis not present

## 2012-01-17 ENCOUNTER — Other Ambulatory Visit: Payer: Self-pay | Admitting: *Deleted

## 2012-01-17 DIAGNOSIS — I6529 Occlusion and stenosis of unspecified carotid artery: Secondary | ICD-10-CM

## 2012-01-27 ENCOUNTER — Encounter: Payer: Self-pay | Admitting: Neurosurgery

## 2012-01-28 ENCOUNTER — Ambulatory Visit (INDEPENDENT_AMBULATORY_CARE_PROVIDER_SITE_OTHER): Payer: Medicare Other | Admitting: Neurosurgery

## 2012-01-28 ENCOUNTER — Encounter: Payer: Self-pay | Admitting: Neurosurgery

## 2012-01-28 ENCOUNTER — Other Ambulatory Visit (INDEPENDENT_AMBULATORY_CARE_PROVIDER_SITE_OTHER): Payer: Medicare Other | Admitting: *Deleted

## 2012-01-28 VITALS — BP 141/82 | HR 52 | Resp 16 | Ht 65.0 in | Wt 177.0 lb

## 2012-01-28 DIAGNOSIS — I6529 Occlusion and stenosis of unspecified carotid artery: Secondary | ICD-10-CM

## 2012-01-28 NOTE — Progress Notes (Signed)
[facility] [place] Carotid Office Note  CC: Carotid surveillance Referring Physician: Early  History of Present Illness: 72 year old female patient of Dr. Arbie Cookey followed for known carotid stenosis. The patient denies any signs or symptoms of CVA, TIA, amaurosis fugax. The patient denies any new medical diagnoses or recent surgery.  Past Medical History  Diagnosis Date  . Hypertension   . Hyperlipidemia   . Anxiety   . Diabetes mellitus   . Angina   . Carotid artery occlusion   . Coronary artery disease     ROS: [x]  Positive   [ ]  Denies    General: [ ]  Weight loss, [ ]  Fever, [ ]  chills Neurologic: [ ]  Dizziness, [ ]  Blackouts, [ ]  Seizure [ ]  Stroke, [ ]  "Mini stroke", [ ]  Slurred speech, [ ]  Temporary blindness; [ ]  weakness in arms or legs, [ ]  Hoarseness Cardiac: [ ]  Chest pain/pressure, [ ]  Shortness of breath at rest [ ]  Shortness of breath with exertion, [ ]  Atrial fibrillation or irregular heartbeat Vascular: [ ]  Pain in legs with walking, [ ]  Pain in legs at rest, [ ]  Pain in legs at night,  [ ]  Non-healing ulcer, [ ]  Blood clot in vein/DVT,   Pulmonary: [ ]  Home oxygen, [ ]  Productive cough, [ ]  Coughing up blood, [ ]  Asthma,  [ ]  Wheezing Musculoskeletal:  [ ]  Arthritis, [ ]  Low back pain, [ ]  Joint pain Hematologic: [ ]  Easy Bruising, [ ]  Anemia; [ ]  Hepatitis Gastrointestinal: [ ]  Blood in stool, [ ]  Gastroesophageal Reflux/heartburn, [ ]  Trouble swallowing Urinary: [ ]  chronic Kidney disease, [ ]  on HD - [ ]  MWF or [ ]  TTHS, [ ]  Burning with urination, [ ]  Difficulty urinating Skin: [ ]  Rashes, [ ]  Wounds Psychological: [ ]  Anxiety, [ ]  Depression   Social History History  Substance Use Topics  . Smoking status: Never Smoker   . Smokeless tobacco: Never Used  . Alcohol Use: No    Family History Family History  Problem Relation Age of Onset  . Heart attack Father   . Heart disease Father   . Hyperlipidemia Father   . Heart disease  Brother     Heart dissease before age 60  . Hyperlipidemia Brother   . Heart attack Paternal Grandfather   . Heart disease Paternal Grandfather     Allergies  Allergen Reactions  . Codeine Other (See Comments)    Abnormal behavior  . Latex Other (See Comments)    Teras skin  . Morphine And Related     Affects BP and blood sugar.    Current Outpatient Prescriptions  Medication Sig Dispense Refill  . acetaminophen (TYLENOL) 500 MG tablet Take 500 mg by mouth every 6 (six) hours as needed. For pain      . Ascorbic Acid (VITAMIN C) 1000 MG tablet Take 1,000 mg by mouth daily.      Marland Kitchen aspirin 81 MG tablet Take 81 mg by mouth daily.      . Calcium Carbonate-Vitamin D (CALTRATE 600+D PO) Take 1 tablet by mouth daily.      . carvedilol (COREG) 25 MG tablet Take 25 mg by mouth 2 (two) times daily with a meal.      . citalopram (CELEXA) 20 MG tablet Take 20 mg by mouth daily.      . clopidogrel (PLAVIX) 75 MG tablet Take 75 mg by mouth daily.      . fish oil-omega-3 fatty  acids 1000 MG capsule Take 1 g by mouth daily.      Marland Kitchen glipiZIDE (GLUCOTROL XL) 10 MG 24 hr tablet Take 10 mg by mouth daily.      Marland Kitchen HYDROcodone-acetaminophen (NORCO) 10-325 MG per tablet Take 1 tablet by mouth every 4 (four) hours as needed. For pain      . ibuprofen (ADVIL,MOTRIN) 200 MG tablet Take 600 mg by mouth once.      Marland Kitchen lisinopril (PRINIVIL,ZESTRIL) 10 MG tablet Take 10 mg by mouth daily.      Marland Kitchen loratadine (CLARITIN) 10 MG tablet Take 10 mg by mouth every evening.       . meclizine (ANTIVERT) 25 MG tablet Take 25 mg by mouth 3 (three) times daily as needed. For dizziness      . metFORMIN (GLUCOPHAGE) 500 MG tablet Take 1,000 mg by mouth 2 (two) times daily with a meal.      . nitroGLYCERIN (NITROSTAT) 0.4 MG SL tablet Place 0.4 mg under the tongue every 5 (five) minutes as needed. Chest pain.      Marland Kitchen omeprazole (PRILOSEC) 10 MG capsule Take 10 mg by mouth daily.      . promethazine (PHENERGAN) 12.5 MG tablet Take  12.5-25 mg by mouth every 8 (eight) hours as needed. nausea      . rOPINIRole (REQUIP) 0.25 MG tablet Take 0.25 mg by mouth 3 (three) times daily.      . rosuvastatin (CRESTOR) 20 MG tablet Take 20 mg by mouth at bedtime.       Marland Kitchen aspirin 325 MG buffered tablet Take 162.5 mg by mouth daily.      Marland Kitchen ezetimibe (ZETIA) 10 MG tablet Take 10 mg by mouth daily.        Physical Examination  Filed Vitals:   01/28/12 1414  BP: 141/82  Pulse: 52  Resp:     Body mass index is 29.45 kg/(m^2).  General:  WDWN in NAD Gait: Normal HEENT: WNL Eyes: Pupils equal Pulmonary: normal non-labored breathing , without Rales, rhonchi,  wheezing Cardiac: RRR, without  Murmurs, rubs or gallops; Abdomen: soft, NT, no masses Skin: no rashes, ulcers noted  Vascular Exam Pulses: 3+ radial pulses bilaterally Carotid bruits: Dampened carotid pulses bilaterally Extremities without ischemic changes, no Gangrene , no cellulitis; no open wounds;  Musculoskeletal: no muscle wasting or atrophy   Neurologic: A&O X 3; Appropriate Affect ; SENSATION: normal; MOTOR FUNCTION:  moving all extremities equally. Speech is fluent/normal  Non-Invasive Vascular Imaging CAROTID DUPLEX 01/28/2012  Right ICA 40 - 59 % stenosis Left ICA 60 - 79 % stenosis   ASSESSMENT/PLAN: Asymptomatic patient with advanced left ICA stenosis that is unchanged from previous. The patient will followup in 6 months with repeat carotid duplex. The patient's questions were encouraged and answered, she is in agreement with this plan.  Lauree Chandler ANP   Clinic MD: Early

## 2012-01-29 ENCOUNTER — Other Ambulatory Visit: Payer: Self-pay | Admitting: *Deleted

## 2012-01-29 DIAGNOSIS — I6529 Occlusion and stenosis of unspecified carotid artery: Secondary | ICD-10-CM

## 2012-01-30 DIAGNOSIS — E7800 Pure hypercholesterolemia, unspecified: Secondary | ICD-10-CM | POA: Diagnosis not present

## 2012-01-30 DIAGNOSIS — E119 Type 2 diabetes mellitus without complications: Secondary | ICD-10-CM | POA: Diagnosis not present

## 2012-01-30 DIAGNOSIS — D649 Anemia, unspecified: Secondary | ICD-10-CM | POA: Diagnosis not present

## 2012-02-05 DIAGNOSIS — F329 Major depressive disorder, single episode, unspecified: Secondary | ICD-10-CM | POA: Diagnosis not present

## 2012-02-05 DIAGNOSIS — G2581 Restless legs syndrome: Secondary | ICD-10-CM | POA: Diagnosis not present

## 2012-02-05 DIAGNOSIS — E7800 Pure hypercholesterolemia, unspecified: Secondary | ICD-10-CM | POA: Diagnosis not present

## 2012-02-05 DIAGNOSIS — E119 Type 2 diabetes mellitus without complications: Secondary | ICD-10-CM | POA: Diagnosis not present

## 2012-02-05 DIAGNOSIS — D649 Anemia, unspecified: Secondary | ICD-10-CM | POA: Diagnosis not present

## 2012-02-05 DIAGNOSIS — I10 Essential (primary) hypertension: Secondary | ICD-10-CM | POA: Diagnosis not present

## 2012-02-10 DIAGNOSIS — M47817 Spondylosis without myelopathy or radiculopathy, lumbosacral region: Secondary | ICD-10-CM | POA: Diagnosis not present

## 2012-02-18 DIAGNOSIS — E7800 Pure hypercholesterolemia, unspecified: Secondary | ICD-10-CM | POA: Diagnosis not present

## 2012-02-18 DIAGNOSIS — I10 Essential (primary) hypertension: Secondary | ICD-10-CM | POA: Diagnosis not present

## 2012-02-18 DIAGNOSIS — I2510 Atherosclerotic heart disease of native coronary artery without angina pectoris: Secondary | ICD-10-CM | POA: Diagnosis not present

## 2012-02-27 ENCOUNTER — Encounter (HOSPITAL_COMMUNITY): Payer: Self-pay | Admitting: *Deleted

## 2012-02-27 ENCOUNTER — Other Ambulatory Visit: Payer: Self-pay | Admitting: Gastroenterology

## 2012-02-27 NOTE — Pre-Procedure Instructions (Signed)
Your procedure is scheduled NW:GNFAOZH, March 10, 2012 Report to [facility] Admitting at:1200 Call this number if you have problems morning of your procedure:[phone]  Follow all bowel prep instructions per your doctor's orders.  Do not eat or drink anything after midnight the night before your procedure. You may brush your teeth, rinse out your mouth, but no water, no food, no chewing gum, no mints, no candies, no chewing tobacco.     Take these medicines the morning of your procedure with A SIP OF WATER:Coreg,Priolsec and Lisinopril  Will not take diabetic medication the day of her procedure  Please make arrangements for a responsible person to drive you home after the procedure. You cannot go home by cab/taxi. We recommend you have someone with you at home the first 24 hours after your procedure. Driver for procedure is friend Kary Kos [phone]  LEAVE ALL VALUABLES, JEWELRY, BILLFOLD AT HOME.  NO DENTURES, CONTACT LENSES ALLOWED IN THE ENDOSCOPY ROOM.   YOU MAY WEAR DEODORANT, PLEASE REMOVE ALL JEWELRY, WATCHES RINGS, BODY PIERCINGS AND LEAVE AT HOME.   WOMEN: NO MAKE-UP, LOTIONS PERFUMES

## 2012-02-28 DIAGNOSIS — H579 Unspecified disorder of eye and adnexa: Secondary | ICD-10-CM | POA: Diagnosis not present

## 2012-02-28 DIAGNOSIS — H00019 Hordeolum externum unspecified eye, unspecified eyelid: Secondary | ICD-10-CM | POA: Diagnosis not present

## 2012-03-05 ENCOUNTER — Encounter (HOSPITAL_COMMUNITY): Payer: Self-pay | Admitting: Pharmacy Technician

## 2012-03-09 DIAGNOSIS — Z1211 Encounter for screening for malignant neoplasm of colon: Secondary | ICD-10-CM | POA: Diagnosis not present

## 2012-03-09 DIAGNOSIS — Z86010 Personal history of colonic polyps: Secondary | ICD-10-CM | POA: Diagnosis not present

## 2012-03-10 ENCOUNTER — Ambulatory Visit (HOSPITAL_COMMUNITY)
Admission: RE | Admit: 2012-03-10 | Discharge: 2012-03-10 | Disposition: A | Discharge status: Home / Self Care | Payer: Medicare Other | Source: Ambulatory Visit | Attending: Gastroenterology | Admitting: Gastroenterology

## 2012-03-10 ENCOUNTER — Ambulatory Visit (HOSPITAL_COMMUNITY): Payer: Medicare Other | Admitting: Anesthesiology

## 2012-03-10 ENCOUNTER — Encounter (HOSPITAL_COMMUNITY)
Admission: RE | Disposition: A | Discharge status: Home / Self Care | Payer: Self-pay | Source: Ambulatory Visit | Attending: Gastroenterology

## 2012-03-10 ENCOUNTER — Encounter (HOSPITAL_COMMUNITY): Payer: Self-pay | Admitting: Anesthesiology

## 2012-03-10 ENCOUNTER — Encounter (HOSPITAL_COMMUNITY): Payer: Self-pay

## 2012-03-10 DIAGNOSIS — K3189 Other diseases of stomach and duodenum: Secondary | ICD-10-CM | POA: Insufficient documentation

## 2012-03-10 DIAGNOSIS — R1013 Epigastric pain: Secondary | ICD-10-CM | POA: Diagnosis not present

## 2012-03-10 DIAGNOSIS — R141 Gas pain: Secondary | ICD-10-CM | POA: Diagnosis not present

## 2012-03-10 DIAGNOSIS — Z860100 Personal history of colon polyps, unspecified: Secondary | ICD-10-CM | POA: Insufficient documentation

## 2012-03-10 DIAGNOSIS — I10 Essential (primary) hypertension: Secondary | ICD-10-CM | POA: Diagnosis not present

## 2012-03-10 DIAGNOSIS — E119 Type 2 diabetes mellitus without complications: Secondary | ICD-10-CM | POA: Diagnosis not present

## 2012-03-10 DIAGNOSIS — Z79899 Other long term (current) drug therapy: Secondary | ICD-10-CM | POA: Diagnosis not present

## 2012-03-10 DIAGNOSIS — K219 Gastro-esophageal reflux disease without esophagitis: Secondary | ICD-10-CM | POA: Insufficient documentation

## 2012-03-10 DIAGNOSIS — Z7982 Long term (current) use of aspirin: Secondary | ICD-10-CM | POA: Diagnosis not present

## 2012-03-10 DIAGNOSIS — K2970 Gastritis, unspecified, without bleeding: Secondary | ICD-10-CM | POA: Diagnosis not present

## 2012-03-10 DIAGNOSIS — E7800 Pure hypercholesterolemia, unspecified: Secondary | ICD-10-CM | POA: Insufficient documentation

## 2012-03-10 DIAGNOSIS — R142 Eructation: Secondary | ICD-10-CM | POA: Insufficient documentation

## 2012-03-10 DIAGNOSIS — Z951 Presence of aortocoronary bypass graft: Secondary | ICD-10-CM | POA: Diagnosis not present

## 2012-03-10 DIAGNOSIS — R143 Flatulence: Secondary | ICD-10-CM | POA: Insufficient documentation

## 2012-03-10 DIAGNOSIS — Z7902 Long term (current) use of antithrombotics/antiplatelets: Secondary | ICD-10-CM | POA: Insufficient documentation

## 2012-03-10 DIAGNOSIS — R110 Nausea: Secondary | ICD-10-CM | POA: Insufficient documentation

## 2012-03-10 HISTORY — PX: COLONOSCOPY WITH PROPOFOL: SHX5780

## 2012-03-10 HISTORY — PX: ESOPHAGOGASTRODUODENOSCOPY: SHX5428

## 2012-03-10 LAB — GLUCOSE, CAPILLARY: Glucose-Capillary: 167 mg/dL — ABNORMAL HIGH (ref 70–99)

## 2012-03-10 SURGERY — COLONOSCOPY WITH PROPOFOL
Anesthesia: Monitor Anesthesia Care

## 2012-03-10 MED ORDER — LACTATED RINGERS IV SOLN
INTRAVENOUS | Status: DC
  Administered 2012-03-10: 1000 mL via INTRAVENOUS

## 2012-03-10 MED ORDER — SODIUM CHLORIDE 0.9 % IV SOLN: INTRAVENOUS | Status: DC

## 2012-03-10 MED ORDER — PROPOFOL 10 MG/ML IV EMUL
INTRAVENOUS | Status: DC | PRN
  Administered 2012-03-10: 100 ug/kg/min via INTRAVENOUS

## 2012-03-10 MED ORDER — BUTAMBEN-TETRACAINE-BENZOCAINE 2-2-14 % EX AERO
INHALATION_SPRAY | CUTANEOUS | Status: DC | PRN
  Administered 2012-03-10: 1 via TOPICAL

## 2012-03-10 SURGICAL SUPPLY — 21 items

## 2012-03-10 NOTE — H&P (Signed)
Problem: Epigastric dyspepsia and history of adenomatous colon polyps  History: The patient is a 72 year old female born [birth year]. In August 2009, the patient underwent a surveillance colonoscopy with removal of adenomatous colon polyps. She is scheduled to undergo a repeat surveillance colonoscopy with polypectomy to prevent colon cancer.  Intermittently, the patient experiences this peptic type epigastric bloating with nausea unassociated with vomiting, heartburn, and dysphagia. She chronically takes aspirin, iron, Plavix, vitamin C, and metformin. There is no past history of peptic ulcer disease.  The patient is scheduled to undergo a diagnostic esophagogastroduodenoscopy and surveillance colonoscopy today.  Medication allergies: Codeine. Latex. Morphine.  Chronic medications: Lisinopril. Citalopram. Omeprazole. Requip. Metformin. Carvedilol. Aspirin. Plavix. Vitamin C. Hydrocodone. Tylenol. Nitroglycerin. Fish oil. Glipizide. Robaxin. Claritin. Lipitor. Meclizine  Past medical and surgical history: Hypertension. Hypercholesterolemia. Type 2 diabetes mellitus. Anxiety and depression. Gastroesophageal reflux. Irritable bowel syndrome. History of adenomatous colon polyps. Coronary artery bypass grafting in 1997. Bilateral carotid artery stenosis. Degenerative joint disease. Corneal transplant. Deviated septum surgery. Tonsillectomy. Cataract surgery.  Habits: The patient does not smoke cigarettes. She consumes alcohol in moderation  Plan: Proceed with diagnostic esophagogastroduodenoscopy and surveillance colonoscopy.

## 2012-03-10 NOTE — Op Note (Signed)
Procedure: Diagnostic esophagogastroduodenoscopy  Indication: Epigastric dyspeptic-type bloating discomfort on aspirin and Plavix  Endoscopist: Danise Edge  Premedication: Propofol administered by anesthesia  Procedure: The patient was placed in the left lateral decubitus position. The Pentax gastroscope was passed through the posterior hypopharynx into the proximal esophagus without difficulty. The hypopharynx, larynx, and vocal cords appeared normal.  Esophagoscopy: The proximal, mid, and lower segments of the esophageal mucosa appear normal. The squamocolumnar junction is regular and noted at approximately 40 cm from the incisor teeth.  Gastroscopy: Retroflex view of the gastric cardia and fundus was normal. The gastric body, antrum, and pylorus appeared normal.  Duodenoscopy: The duodenal bulb and descending duodenum appeared normal  Assessment: Normal esophagogastroduodenoscopy.  Procedure: Surveillance colonoscopy  Procedure: The patient was placed in the left lateral decubitus position. Anal inspection and digital rectal exam were normal. The Pentax colonoscope was inserted into the rectum and advanced to the cecum as identified by a normal-appearing ileocecal valve and appendiceal orifice. Colonic preparation for the exam today was good.  Rectum. Normal. Retroflex view of the distal rectum normal.  Sigmoid colon and descending colon. Normal.  Splenic flexure. Normal.  Transverse colon. Normal.  Hepatic flexure. Normal.  Ascending colon. Normal.  Cecum and ileocecal valve. Normal.  Assessment: Normal surveillance proctocolonoscopy to the cecum.  Recommendations: Schedule repeat surveillance colonoscopy in 5 years if the patient's health allows.

## 2012-03-10 NOTE — Transfer of Care (Signed)
Immediate Anesthesia Transfer of Care Note  Patient: Andrea Kirk  Procedure(s) Performed: Procedure(s): COLONOSCOPY WITH PROPOFOL (N/A) ESOPHAGOGASTRODUODENOSCOPY (EGD) (N/A)  Patient Location: PACU  Anesthesia Type:MAC  Level of Consciousness: awake, alert  and oriented  Airway & Oxygen Therapy: Patient Spontanous Breathing and Patient connected to nasal cannula oxygen  Post-op Assessment: Report given to PACU RN and Post -op Vital signs reviewed and stable  Post vital signs: Reviewed and stable  Complications: No apparent anesthesia complications

## 2012-03-10 NOTE — Anesthesia Preprocedure Evaluation (Signed)
Anesthesia Evaluation  Patient identified by MRN, date of birth, ID band Patient awake    Reviewed: Allergy & Precautions, H&P , NPO status , Patient's Chart, lab work & pertinent test results  Airway Mallampati: II TM Distance: >3 FB Neck ROM: Full    Dental no notable dental hx.    Pulmonary neg pulmonary ROS,  breath sounds clear to auscultation  Pulmonary exam normal       Cardiovascular hypertension, Pt. on home beta blockers and Pt. on medications + angina + CAD and + Peripheral Vascular Disease negative cardio ROS  Rhythm:Regular Rate:Normal     Neuro/Psych Anxiety negative neurological ROS     GI/Hepatic negative GI ROS, Neg liver ROS,   Endo/Other  diabetes, Type 2, Oral Hypoglycemic Agents  Renal/GU negative Renal ROS  negative genitourinary   Musculoskeletal negative musculoskeletal ROS (+)   Abdominal   Peds negative pediatric ROS (+)  Hematology negative hematology ROS (+)   Anesthesia Other Findings   Reproductive/Obstetrics negative OB ROS                           Anesthesia Physical Anesthesia Plan  ASA: III  Anesthesia Plan: MAC   Post-op Pain Management:    Induction: Intravenous  Airway Management Planned:   Additional Equipment:   Intra-op Plan:   Post-operative Plan:   Informed Consent: I have reviewed the patients History and Physical, chart, labs and discussed the procedure including the risks, benefits and alternatives for the proposed anesthesia with the patient or authorized representative who has indicated his/her understanding and acceptance.   Dental advisory given  Plan Discussed with: CRNA  Anesthesia Plan Comments:         Anesthesia Quick Evaluation

## 2012-03-11 ENCOUNTER — Encounter (HOSPITAL_COMMUNITY): Payer: Self-pay | Admitting: Gastroenterology

## 2012-03-11 NOTE — Anesthesia Postprocedure Evaluation (Signed)
  Anesthesia Post-op Note  Patient: Andrea Kirk  Procedure(s) Performed: Procedure(s) (LRB): COLONOSCOPY WITH PROPOFOL (N/A) ESOPHAGOGASTRODUODENOSCOPY (EGD) (N/A)  Patient Location: PACU  Anesthesia Type: MAC  Level of Consciousness: awake and alert   Airway and Oxygen Therapy: Patient Spontanous Breathing  Post-op Pain: mild  Post-op Assessment: Post-op Vital signs reviewed, Patient's Cardiovascular Status Stable, Respiratory Function Stable, Patent Airway and No signs of Nausea or vomiting  Last Vitals:  Filed Vitals:   03/10/12 1400  BP: 162/83  Temp:   Resp: 14    Post-op Vital Signs: stable   Complications: No apparent anesthesia complications

## 2012-03-25 DIAGNOSIS — R109 Unspecified abdominal pain: Secondary | ICD-10-CM | POA: Diagnosis not present

## 2012-03-25 DIAGNOSIS — F329 Major depressive disorder, single episode, unspecified: Secondary | ICD-10-CM | POA: Diagnosis not present

## 2012-07-27 ENCOUNTER — Encounter: Payer: Self-pay | Admitting: Vascular Surgery

## 2012-07-27 DIAGNOSIS — E7800 Pure hypercholesterolemia, unspecified: Secondary | ICD-10-CM | POA: Diagnosis not present

## 2012-07-27 DIAGNOSIS — E119 Type 2 diabetes mellitus without complications: Secondary | ICD-10-CM | POA: Diagnosis not present

## 2012-07-28 ENCOUNTER — Ambulatory Visit: Payer: Medicare Other | Admitting: Vascular Surgery

## 2012-07-28 ENCOUNTER — Other Ambulatory Visit: Payer: Medicare Other

## 2012-07-28 ENCOUNTER — Ambulatory Visit: Payer: Medicare Other | Admitting: Neurosurgery

## 2012-07-31 DIAGNOSIS — I6529 Occlusion and stenosis of unspecified carotid artery: Secondary | ICD-10-CM | POA: Diagnosis not present

## 2012-07-31 DIAGNOSIS — IMO0001 Reserved for inherently not codable concepts without codable children: Secondary | ICD-10-CM | POA: Diagnosis not present

## 2012-07-31 DIAGNOSIS — F329 Major depressive disorder, single episode, unspecified: Secondary | ICD-10-CM | POA: Diagnosis not present

## 2012-07-31 DIAGNOSIS — I10 Essential (primary) hypertension: Secondary | ICD-10-CM | POA: Diagnosis not present

## 2012-07-31 DIAGNOSIS — J029 Acute pharyngitis, unspecified: Secondary | ICD-10-CM | POA: Diagnosis not present

## 2012-07-31 DIAGNOSIS — I2510 Atherosclerotic heart disease of native coronary artery without angina pectoris: Secondary | ICD-10-CM | POA: Diagnosis not present

## 2012-08-24 DIAGNOSIS — IMO0002 Reserved for concepts with insufficient information to code with codable children: Secondary | ICD-10-CM | POA: Diagnosis not present

## 2012-08-24 DIAGNOSIS — M48061 Spinal stenosis, lumbar region without neurogenic claudication: Secondary | ICD-10-CM | POA: Diagnosis not present

## 2012-08-24 DIAGNOSIS — M47817 Spondylosis without myelopathy or radiculopathy, lumbosacral region: Secondary | ICD-10-CM | POA: Diagnosis not present

## 2012-08-24 DIAGNOSIS — M4310 Spondylolisthesis, site unspecified: Secondary | ICD-10-CM | POA: Diagnosis not present

## 2012-10-01 DIAGNOSIS — E119 Type 2 diabetes mellitus without complications: Secondary | ICD-10-CM | POA: Diagnosis not present

## 2012-10-01 DIAGNOSIS — H18609 Keratoconus, unspecified, unspecified eye: Secondary | ICD-10-CM | POA: Diagnosis not present

## 2012-10-01 DIAGNOSIS — H259 Unspecified age-related cataract: Secondary | ICD-10-CM | POA: Diagnosis not present

## 2012-10-01 DIAGNOSIS — Z947 Corneal transplant status: Secondary | ICD-10-CM | POA: Diagnosis not present

## 2012-10-05 ENCOUNTER — Encounter: Payer: Self-pay | Admitting: Vascular Surgery

## 2012-10-06 ENCOUNTER — Ambulatory Visit (INDEPENDENT_AMBULATORY_CARE_PROVIDER_SITE_OTHER): Payer: Medicare Other | Admitting: Vascular Surgery

## 2012-10-06 ENCOUNTER — Encounter: Payer: Self-pay | Admitting: Vascular Surgery

## 2012-10-06 ENCOUNTER — Ambulatory Visit (HOSPITAL_COMMUNITY)
Admission: RE | Admit: 2012-10-06 | Discharge: 2012-10-06 | Disposition: A | Discharge status: Home / Self Care | Payer: Medicare Other | Source: Ambulatory Visit | Attending: Vascular Surgery | Admitting: Vascular Surgery

## 2012-10-06 DIAGNOSIS — I6529 Occlusion and stenosis of unspecified carotid artery: Secondary | ICD-10-CM

## 2012-10-06 NOTE — Progress Notes (Signed)
Patient presents today for followup of her moderate to severe bilateral asymptomatic carotid stenosis. He denies any neurologic deficits. She is a coronary bypass grafting in 1997% no difficulty since this time.  Past Medical History  Diagnosis Date  . Hypertension   . Hyperlipidemia   . Anxiety   . Diabetes mellitus   . Angina   . Carotid artery occlusion   . Coronary artery disease     History  Substance Use Topics  . Smoking status: Never Smoker   . Smokeless tobacco: Never Used  . Alcohol Use: No    Family History  Problem Relation Age of Onset  . Heart attack Father   . Heart disease Father   . Hyperlipidemia Father   . Heart disease Brother     Heart dissease before age 26  . Hyperlipidemia Brother   . Heart attack Paternal Grandfather   . Heart disease Paternal Grandfather     Allergies  Allergen Reactions  . Codeine Other (See Comments)    Abnormal behavior  . Latex Other (See Comments)    Teras skin  . Morphine And Related     Affects BP and blood sugar.    Current outpatient prescriptions:acetaminophen (TYLENOL) 500 MG tablet, Take 500 mg by mouth every 6 (six) hours as needed. For pain, Disp: , Rfl: ;  Ascorbic Acid (VITAMIN C) 1000 MG tablet, Take 1,000 mg by mouth daily., Disp: , Rfl: ;  aspirin EC 81 MG tablet, Take 81 mg by mouth every morning., Disp: , Rfl: ;  atorvastatin (LIPITOR) 40 MG tablet, Take 40 mg by mouth at bedtime., Disp: , Rfl:  Calcium Carbonate-Vitamin D (CALTRATE 600+D PO), Take 1 tablet by mouth daily., Disp: , Rfl: ;  carvedilol (COREG) 25 MG tablet, Take 25 mg by mouth 2 (two) times daily with a meal., Disp: , Rfl: ;  citalopram (CELEXA) 20 MG tablet, Take 20 mg by mouth every morning. , Disp: , Rfl: ;  clopidogrel (PLAVIX) 75 MG tablet, Take 75 mg by mouth every morning. , Disp: , Rfl:  fish oil-omega-3 fatty acids 1000 MG capsule, Take 1 g by mouth daily., Disp: , Rfl: ;  glipiZIDE (GLUCOTROL XL) 10 MG 24 hr tablet, Take 10 mg by mouth  daily., Disp: , Rfl: ;  HYDROcodone-acetaminophen (NORCO) 10-325 MG per tablet, Take 1 tablet by mouth every 4 (four) hours as needed. For pain, Disp: , Rfl: ;  ibuprofen (ADVIL,MOTRIN) 200 MG tablet, Take 400 mg by mouth every 6 (six) hours as needed for pain. , Disp: , Rfl:  lisinopril (PRINIVIL,ZESTRIL) 10 MG tablet, Take 10 mg by mouth every evening. , Disp: , Rfl: ;  loratadine (CLARITIN) 10 MG tablet, Take 10 mg by mouth daily as needed for allergies. , Disp: , Rfl: ;  meclizine (ANTIVERT) 25 MG tablet, Take 25 mg by mouth 3 (three) times daily as needed. For dizziness, Disp: , Rfl: ;  metFORMIN (GLUCOPHAGE) 500 MG tablet, Take 1,000 mg by mouth 2 (two) times daily with a meal., Disp: , Rfl:  nitroGLYCERIN (NITROSTAT) 0.4 MG SL tablet, Place 0.4 mg under the tongue every 5 (five) minutes as needed. Chest pain., Disp: , Rfl: ;  omeprazole (PRILOSEC) 10 MG capsule, Take 10 mg by mouth daily., Disp: , Rfl: ;  oxymetazoline (AFRIN) 0.05 % nasal spray, Place 2 sprays into the nose daily as needed for congestion., Disp: , Rfl:  promethazine (PHENERGAN) 12.5 MG tablet, Take 12.5-25 mg by mouth every 8 (eight) hours as  needed. nausea, Disp: , Rfl: ;  rOPINIRole (REQUIP) 0.25 MG tablet, Take 0.25 mg by mouth 3 (three) times daily., Disp: , Rfl:   BP 171/87  Pulse 64  Ht 5\' 5"  (1.651 m)  Wt 179 lb 6.4 oz (81.375 kg)  BMI 29.85 kg/m2  SpO2 100%  Body mass index is 29.85 kg/(m^2).       Visible exam well-developed well nourished white female no acute distress Neurologically she is grossly intact Radial pulses are 2+ bilaterally Carotid arteries without bruits bilaterally Heart regular rate and rhythm without murmur Respirations are equal and nonlabored  Carotid duplex today reveals no significant change she does have 60-79% stenoses bilaterally.  Impression and plan bilateral moderate-to-severe carotid stenoses. Again reviewed symptoms of carotid disease with her and she'll notify me should  this occur. Otherwise we will see her again in 6 months with repeat carotid duplex

## 2012-10-07 NOTE — Addendum Note (Signed)
Addended by: Sharee Pimple on: 10/07/2012 07:58 AM   Modules accepted: Orders

## 2012-12-15 DIAGNOSIS — Z23 Encounter for immunization: Secondary | ICD-10-CM | POA: Diagnosis not present

## 2012-12-15 DIAGNOSIS — T148XXA Other injury of unspecified body region, initial encounter: Secondary | ICD-10-CM | POA: Diagnosis not present

## 2012-12-26 IMAGING — CR DG LUMBAR SPINE 1V
1 series · 1 of 1 positions shown · non-contrast
Comparison: MRI dated 02/15/2011

CLINICAL DATA: Lumbar disc protrusion.

LUMBAR SPINE - 1 VIEW

[view not recorded]
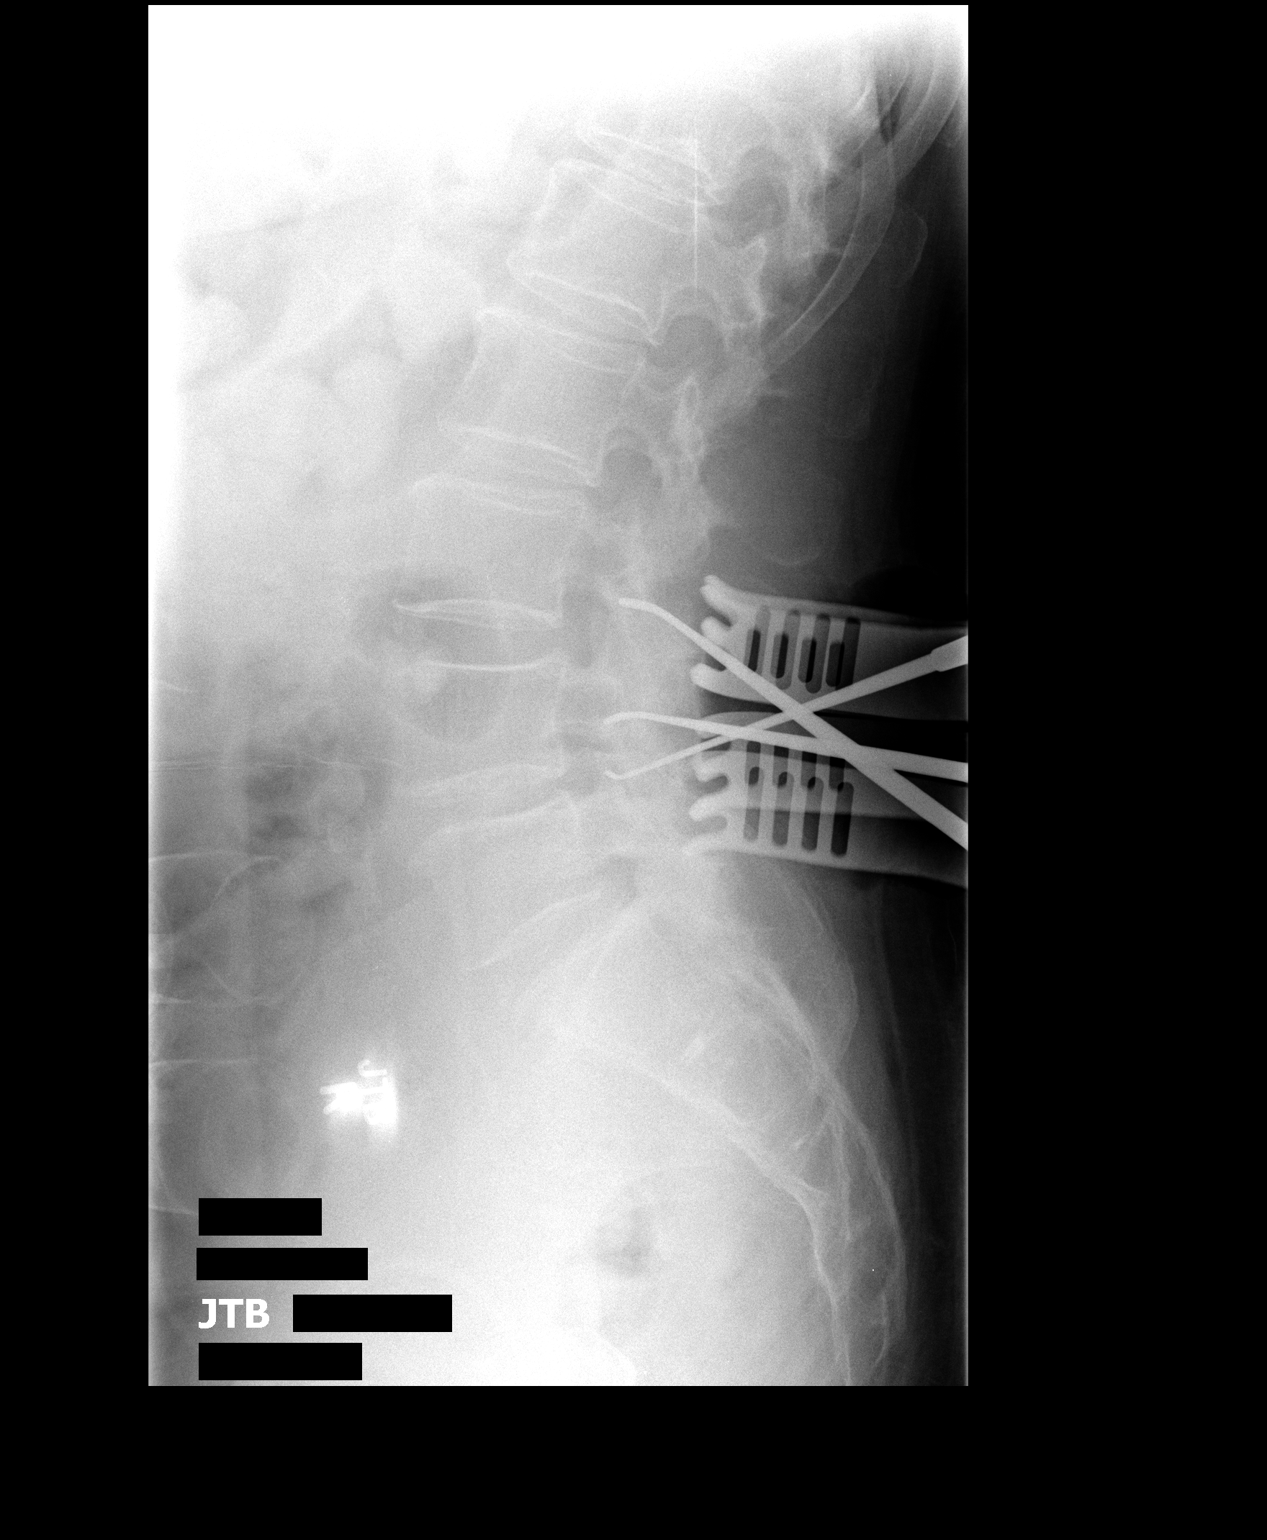

[1 of 1 positions shown; findings below may reference images not displayed]

FINDINGS: There are two instruments at the L4-5 level.  There is a
third instrument at the inferior margin of the pedicles of L3.
IMPRESSION: Instruments at the L3-4 and L4-5.

## 2013-01-14 DIAGNOSIS — D649 Anemia, unspecified: Secondary | ICD-10-CM | POA: Diagnosis not present

## 2013-01-14 DIAGNOSIS — E119 Type 2 diabetes mellitus without complications: Secondary | ICD-10-CM | POA: Diagnosis not present

## 2013-01-14 DIAGNOSIS — E7800 Pure hypercholesterolemia, unspecified: Secondary | ICD-10-CM | POA: Diagnosis not present

## 2013-01-14 DIAGNOSIS — Z79899 Other long term (current) drug therapy: Secondary | ICD-10-CM | POA: Diagnosis not present

## 2013-02-16 ENCOUNTER — Ambulatory Visit: Payer: Medicare Other | Admitting: Interventional Cardiology

## 2013-02-19 DIAGNOSIS — E7800 Pure hypercholesterolemia, unspecified: Secondary | ICD-10-CM | POA: Diagnosis not present

## 2013-02-19 DIAGNOSIS — I6529 Occlusion and stenosis of unspecified carotid artery: Secondary | ICD-10-CM | POA: Diagnosis not present

## 2013-02-19 DIAGNOSIS — I10 Essential (primary) hypertension: Secondary | ICD-10-CM | POA: Diagnosis not present

## 2013-02-19 DIAGNOSIS — IMO0001 Reserved for inherently not codable concepts without codable children: Secondary | ICD-10-CM | POA: Diagnosis not present

## 2013-02-19 DIAGNOSIS — G2581 Restless legs syndrome: Secondary | ICD-10-CM | POA: Diagnosis not present

## 2013-02-19 DIAGNOSIS — I2510 Atherosclerotic heart disease of native coronary artery without angina pectoris: Secondary | ICD-10-CM | POA: Diagnosis not present

## 2013-02-22 DIAGNOSIS — I10 Essential (primary) hypertension: Secondary | ICD-10-CM | POA: Diagnosis not present

## 2013-02-22 DIAGNOSIS — IMO0002 Reserved for concepts with insufficient information to code with codable children: Secondary | ICD-10-CM | POA: Diagnosis not present

## 2013-02-22 DIAGNOSIS — M47817 Spondylosis without myelopathy or radiculopathy, lumbosacral region: Secondary | ICD-10-CM | POA: Diagnosis not present

## 2013-02-22 DIAGNOSIS — M4310 Spondylolisthesis, site unspecified: Secondary | ICD-10-CM | POA: Diagnosis not present

## 2013-02-26 DIAGNOSIS — Z947 Corneal transplant status: Secondary | ICD-10-CM | POA: Diagnosis not present

## 2013-02-26 DIAGNOSIS — H18609 Keratoconus, unspecified, unspecified eye: Secondary | ICD-10-CM | POA: Diagnosis not present

## 2013-02-26 DIAGNOSIS — H259 Unspecified age-related cataract: Secondary | ICD-10-CM | POA: Diagnosis not present

## 2013-02-26 DIAGNOSIS — E119 Type 2 diabetes mellitus without complications: Secondary | ICD-10-CM | POA: Diagnosis not present

## 2013-03-04 ENCOUNTER — Other Ambulatory Visit: Payer: Self-pay | Admitting: Interventional Cardiology

## 2013-03-11 ENCOUNTER — Encounter: Payer: Self-pay | Admitting: Interventional Cardiology

## 2013-03-11 ENCOUNTER — Ambulatory Visit (INDEPENDENT_AMBULATORY_CARE_PROVIDER_SITE_OTHER): Payer: Medicare Other | Admitting: Interventional Cardiology

## 2013-03-11 VITALS — BP 204/84 | HR 59 | Ht 65.0 in | Wt 181.0 lb

## 2013-03-11 DIAGNOSIS — I10 Essential (primary) hypertension: Secondary | ICD-10-CM | POA: Diagnosis not present

## 2013-03-11 DIAGNOSIS — E782 Mixed hyperlipidemia: Secondary | ICD-10-CM

## 2013-03-11 DIAGNOSIS — I2510 Atherosclerotic heart disease of native coronary artery without angina pectoris: Secondary | ICD-10-CM

## 2013-03-11 DIAGNOSIS — E785 Hyperlipidemia, unspecified: Secondary | ICD-10-CM | POA: Insufficient documentation

## 2013-03-11 DIAGNOSIS — Z79899 Other long term (current) drug therapy: Secondary | ICD-10-CM | POA: Diagnosis not present

## 2013-03-11 HISTORY — DX: Essential (primary) hypertension: I10

## 2013-03-11 HISTORY — DX: Mixed hyperlipidemia: E78.2

## 2013-03-11 HISTORY — DX: Atherosclerotic heart disease of native coronary artery without angina pectoris: I25.10

## 2013-03-11 MED ORDER — LISINOPRIL 20 MG PO TABS: ORAL_TABLET | ORAL | Status: DC

## 2013-03-11 NOTE — Progress Notes (Signed)
Patient ID: Andrea Kirk, female   DOB: [birth year], 73 y.o.   MRN: [id number]    [place], [address] [place], [place]  [address] Phone: [phone] Fax:  [phone]  Date:  03/11/2013   ID:  Andrea Kirk, DOB [birth year], MRN [id number]  PCP:  Luanne Bras, FNP      History of Present Illness: Andrea Kirk is a 73 y.o. female  who had CABG in 1997. She had been walking but is now limited due to knee pain after a fall, and the cold weather.  She had a fall where her leg gave way.  No syncope.  SHe had bruising on her face, in 2014.  CAD/ASCVD:  Denies : Chest pain.  Diaphoresis.  Dizziness.  Dyspnea on exertion.  Leg edema.  Nitroglycerin.  Orthopnea.  Palpitations.  Paroxysmal nocturnal dyspnea.  Shortness of breath.  Syncope.    She feels some tingling in her hands.      Wt Readings from Last 3 Encounters:  03/11/13 181 lb (82.101 kg)  10/06/12 179 lb 6.4 oz (81.375 kg)  01/28/12 177 lb (80.287 kg)     Past Medical History  Diagnosis Date  . Hypertension   . Hyperlipidemia   . Anxiety   . Diabetes mellitus   . Angina   . Carotid artery occlusion   . Coronary artery disease     Current Outpatient Prescriptions  Medication Sig Dispense Refill  . acetaminophen (TYLENOL) 500 MG tablet Take 500 mg by mouth every 6 (six) hours as needed. For pain      . Ascorbic Acid (VITAMIN C) 1000 MG tablet Take 1,000 mg by mouth daily.      Marland Kitchen aspirin EC 81 MG tablet Take 81 mg by mouth every morning.      Marland Kitchen atorvastatin (LIPITOR) 40 MG tablet Take 40 mg by mouth at bedtime.      . Calcium Carbonate-Vitamin D (CALTRATE 600+D PO) Take 1 tablet by mouth daily.      . carvedilol (COREG) 25 MG tablet Take 1 tablet by mouth  twice a day with food  60 tablet  2  . citalopram (CELEXA) 20 MG tablet Take 20 mg by mouth every morning.       . clopidogrel (PLAVIX) 75 MG tablet Take 75 mg by mouth every morning.       . fish oil-omega-3 fatty acids 1000 MG capsule Take 1 g by mouth  daily.      Marland Kitchen glipiZIDE (GLUCOTROL XL) 10 MG 24 hr tablet Take 10 mg by mouth daily.      Marland Kitchen HYDROcodone-acetaminophen (NORCO) 10-325 MG per tablet Take 1 tablet by mouth every 4 (four) hours as needed. For pain      . ibuprofen (ADVIL,MOTRIN) 200 MG tablet Take 400 mg by mouth every 6 (six) hours as needed for pain.       Marland Kitchen lisinopril (PRINIVIL,ZESTRIL) 10 MG tablet Take 10 mg by mouth every evening.       . loratadine (CLARITIN) 10 MG tablet Take 10 mg by mouth daily as needed for allergies.       Marland Kitchen meclizine (ANTIVERT) 25 MG tablet Take 25 mg by mouth 3 (three) times daily as needed. For dizziness      . metFORMIN (GLUCOPHAGE) 500 MG tablet Take 1,000 mg by mouth 2 (two) times daily with a meal.      . nitroGLYCERIN (NITROSTAT) 0.4 MG SL tablet Place 0.4 mg under the tongue every 5 (five) minutes  as needed. Chest pain.      Marland Kitchen oxymetazoline (AFRIN) 0.05 % nasal spray Place 2 sprays into the nose daily as needed for congestion.      . promethazine (PHENERGAN) 12.5 MG tablet Take 12.5-25 mg by mouth every 8 (eight) hours as needed. nausea      . rOPINIRole (REQUIP) 0.25 MG tablet Take 0.25 mg by mouth 3 (three) times daily.       No current facility-administered medications for this visit.    Allergies:    Allergies  Allergen Reactions  . Codeine Other (See Comments)    Abnormal behavior  . Latex Other (See Comments)    Teras skin  . Morphine And Related     Affects BP and blood sugar.    Social History:  The patient  reports that she has never smoked. She has never used smokeless tobacco. She reports that she does not drink alcohol or use illicit drugs.   Family History:  The patient's family history includes Heart attack in her father and paternal grandfather; Heart disease in her brother, father, and paternal grandfather; Hyperlipidemia in her brother and father.   ROS:  Please see the history of present illness.  No nausea, vomiting.  No fevers, chills.  No focal weakness.  No  dysuria. As above;   All other systems reviewed and negative.   PHYSICAL EXAM: VS:  BP 204/8  Pulse 59  Ht 5\' 5"  (1.651 m)  Wt 181 lb (82.101 kg)  BMI 30.12 kg/m2 Well nourished, well developed, in no acute distress HEENT: normal Neck: no JVD, no carotid bruits Cardiac:  normal S1, S2; RRR;  Lungs:  clear to auscultation bilaterally, no wheezing, rhonchi or rales Abd: soft, nontender, no hepatomegaly Ext: no edema, 2+ radial pulses bilaterally, normal capillary refill bilaterally Skin: warm and dry Neuro:   no focal abnormalities noted  EKG:  Normal sinus rhythm, inferior Q waves, no ST segment changes    ASSESSMENT AND PLAN:  Treatment  1. Coronary atherosclerosis of native coronary artery  Decrease Aspirin Tablet, 325 MG, half tablet, Orally, Once a day Continue Plavix Tablet, 75 MG, 1 tablet, Orally, Once a day Diagnostic Imaging:EKG Harward,Amy 02/18/2012 11:10:06 AM > Cullen Vanallen,JAY 02/18/2012 11:57:11 AM > NSR, NSST  No angina. HDL low. LDL controlled. COntinue to try to exercise. Stress test in May 2012 showed no ischemia, ejection fraction 64%    2. Essential hypertension, benign  Continue Carvedilol Tablet, 25 MG, 1 tablet with food, Orally, Twice a day Increase Lisinopril Tablet, 20 mg, 1 tablet, Orally, Once a day BP target < 130/80. A little elevated today. See what the readings are at home. If above target, would increase lisinopril to 40 mg daily. Call us with readngs.    3. Pure hypercholesterolemia  Continue Zetia Tablet, 10 MG, 1 tablet, Orally, Once a day Continue Crestor Tablet, 20 MG, 1 tablet, Orally, Once a day Continue Fish Oil Capsule, 1000 MG, 2 caps, Orally, Once a day Low HDL. LDL controlled in Jan 2015. TG back in normal range.   Preventive Medicine  Adult topics discussed:  Diet: healthy diet.  Exercise: at least 30 minutes of aerobic exercise, 5 days a week.      Signed, Mina Marble, MD, [facility] 03/11/2013 2:05 PM

## 2013-03-11 NOTE — Patient Instructions (Signed)
Your physician has recommended you make the following change in your medication:   1. Increase Lisinopril to 20 mg daily.  Your physician has requested that you regularly monitor and record your blood pressure readings at home. Please use the same machine at the same time of day to check your readings and record them to bring to your follow-up visit. Call if BP is consistently above 150/90 at home.   Your physician recommends that you return for lab work in: 1-2 weeks for Bmet.  Your physician wants you to follow-up in: 1 year with Dr. Irish Lack. You will receive a reminder letter in the mail two months in advance. If you don't receive a letter, please call our office to schedule the follow-up appointment.

## 2013-03-18 ENCOUNTER — Other Ambulatory Visit: Payer: Medicare Other

## 2013-03-25 ENCOUNTER — Other Ambulatory Visit: Payer: Medicare Other

## 2013-04-01 ENCOUNTER — Other Ambulatory Visit (INDEPENDENT_AMBULATORY_CARE_PROVIDER_SITE_OTHER): Payer: Medicare Other

## 2013-04-01 DIAGNOSIS — I2510 Atherosclerotic heart disease of native coronary artery without angina pectoris: Secondary | ICD-10-CM

## 2013-04-01 LAB — BASIC METABOLIC PANEL
BUN: 19 mg/dL (ref 6–23)
CO2: 28 mEq/L (ref 19–32)
Calcium: 9.4 mg/dL (ref 8.4–10.5)
Chloride: 104 mEq/L (ref 96–112)
Creatinine, Ser: 0.9 mg/dL (ref 0.4–1.2)
GFR: 68.03 mL/min (ref >60.00)
Glucose, Bld: 142 mg/dL — ABNORMAL HIGH (ref 70–99)
Potassium: 4.5 mEq/L (ref 3.5–5.1)
Sodium: 139 mEq/L (ref 135–145)

## 2013-04-05 ENCOUNTER — Encounter: Payer: Self-pay | Admitting: Family

## 2013-04-06 ENCOUNTER — Encounter: Payer: Self-pay | Admitting: *Deleted

## 2013-04-06 ENCOUNTER — Other Ambulatory Visit: Payer: Self-pay | Admitting: Vascular Surgery

## 2013-04-06 ENCOUNTER — Encounter: Payer: Self-pay | Admitting: Family

## 2013-04-06 ENCOUNTER — Other Ambulatory Visit (HOSPITAL_COMMUNITY): Payer: Medicare Other

## 2013-04-06 ENCOUNTER — Ambulatory Visit: Payer: Medicare Other | Admitting: Family

## 2013-04-06 ENCOUNTER — Ambulatory Visit (HOSPITAL_COMMUNITY)
Admission: RE | Admit: 2013-04-06 | Discharge: 2013-04-06 | Disposition: A | Discharge status: Home / Self Care | Payer: Medicare Other | Source: Ambulatory Visit | Attending: Vascular Surgery | Admitting: Vascular Surgery

## 2013-04-06 ENCOUNTER — Ambulatory Visit (INDEPENDENT_AMBULATORY_CARE_PROVIDER_SITE_OTHER): Payer: Medicare Other | Admitting: Family

## 2013-04-06 ENCOUNTER — Other Ambulatory Visit: Payer: Self-pay | Admitting: *Deleted

## 2013-04-06 VITALS — BP 167/86 | HR 53 | Resp 14 | Ht 65.0 in | Wt 177.0 lb

## 2013-04-06 DIAGNOSIS — I6529 Occlusion and stenosis of unspecified carotid artery: Secondary | ICD-10-CM | POA: Diagnosis not present

## 2013-04-06 NOTE — Progress Notes (Signed)
Established Carotid Patient   History of Present Illness  Andrea Kirk is a 73 y.o. female patient of Dr. Donnetta Hutching who has known carotid stenosis. She returns today for follow up.  Patient has not had previous carotid artery intervention.  Patient has Negative history of TIA or stroke symptom.  The patient denies amaurosis fugax or monocular blindness.  The patient  denies facial drooping.  Pt. denies hemiplegia.  The patient denies receptive or expressive aphasia.  Pt. denies extremity weakness.  Pt Diabetic: Yes, 6.5 A1C per pt, good control Pt smoker: non-smoker  Pt meds include: Statin : Yes ASA: Yes Other anticoagulants/antiplatelets: Plavix   Past Medical History  Diagnosis Date  . Hypertension   . Hyperlipidemia   . Anxiety   . Diabetes mellitus   . Angina   . Carotid artery occlusion   . Coronary artery disease     Social History History  Substance Use Topics  . Smoking status: Never Smoker   . Smokeless tobacco: Never Used  . Alcohol Use: No    Family History Family History  Problem Relation Age of Onset  . Heart attack Father   . Heart disease Father   . Hyperlipidemia Father   . Heart disease Brother     Heart dissease before age 50  . Hyperlipidemia Brother   . Heart attack Paternal Grandfather   . Heart disease Paternal Grandfather     Surgical History Past Surgical History  Procedure Laterality Date  . Coronary artery bypass graft  1997  . 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 Bypass graft  Jan. 1997  . Spine surgery  march 2013    Back surgery  . Pr vein bypass graft,aorto-fem-pop  1997  . Colonoscopy with propofol N/A 03/10/2012    Procedure: COLONOSCOPY WITH PROPOFOL;  Surgeon: Garlan Fair, MD;  Location: [facility];  Service: Endoscopy;  Laterality: N/A;  . Esophagogastroduodenoscopy N/A 03/10/2012    Procedure: ESOPHAGOGASTRODUODENOSCOPY (EGD);  Surgeon: Garlan Fair, MD;  Location: [facility];  Service:  Endoscopy;  Laterality: N/A;    Allergies  Allergen Reactions  . Codeine Other (See Comments)    Abnormal behavior  . Latex Other (See Comments)    Teras skin  . Morphine And Related     Affects BP and blood sugar.    Current Outpatient Prescriptions  Medication Sig Dispense Refill  . acetaminophen (TYLENOL) 500 MG tablet Take 500 mg by mouth every 6 (six) hours as needed. For pain      . Ascorbic Acid (VITAMIN C) 1000 MG tablet Take 1,000 mg by mouth daily.      Marland Kitchen aspirin EC 81 MG tablet Take 81 mg by mouth every morning.      Marland Kitchen atorvastatin (LIPITOR) 40 MG tablet Take 40 mg by mouth at bedtime.      . Calcium Carbonate-Vitamin D (CALTRATE 600+D PO) Take 1 tablet by mouth daily.      . carvedilol (COREG) 25 MG tablet Take 1 tablet by mouth  twice a day with food  60 tablet  2  . citalopram (CELEXA) 20 MG tablet Take 20 mg by mouth every morning.       . clopidogrel (PLAVIX) 75 MG tablet Take 75 mg by mouth every morning.       . fish oil-omega-3 fatty acids 1000 MG capsule Take 1 g by mouth daily.      Marland Kitchen glipiZIDE (GLUCOTROL XL) 10 MG 24  hr tablet Take 10 mg by mouth daily.      Marland Kitchen HYDROcodone-acetaminophen (NORCO) 10-325 MG per tablet Take 1 tablet by mouth every 4 (four) hours as needed. For pain      . ibuprofen (ADVIL,MOTRIN) 200 MG tablet Take 400 mg by mouth every 6 (six) hours as needed for pain.       Marland Kitchen lisinopril (PRINIVIL,ZESTRIL) 20 MG tablet 1 tablet by mouth daily  90 tablet  3  . loratadine (CLARITIN) 10 MG tablet Take 10 mg by mouth daily as needed for allergies.       Marland Kitchen meclizine (ANTIVERT) 25 MG tablet Take 25 mg by mouth 3 (three) times daily as needed. For dizziness      . metFORMIN (GLUCOPHAGE) 500 MG tablet Take 1,000 mg by mouth 2 (two) times daily with a meal.      . nitroGLYCERIN (NITROSTAT) 0.4 MG SL tablet Place 0.4 mg under the tongue every 5 (five) minutes as needed. Chest pain.      Marland Kitchen oxymetazoline (AFRIN) 0.05 % nasal spray Place 2 sprays into the nose  daily as needed for congestion.      . promethazine (PHENERGAN) 12.5 MG tablet Take 12.5-25 mg by mouth every 8 (eight) hours as needed. nausea      . rOPINIRole (REQUIP) 0.25 MG tablet Take 0.25 mg by mouth 3 (three) times daily.       No current facility-administered medications for this visit.    Review of Systems : See HPI for pertinent positives and negatives.  Physical Examination  Filed Vitals:   04/06/13 1016  BP: 167/86  Pulse: 53  Resp: 14   Filed Weights   04/06/13 1016  Weight: 177 lb (80.287 kg)   Body mass index is 29.45 kg/(m^2).  General: WDWN female in NAD GAIT: normal Eyes: PERRLA Pulmonary:  Non-labored, CTAB, Negative  Rales, Negative rhonchi, & Negative wheezing.  Cardiac: regular Rhythm ,  Negative detected murmur.  VASCULAR EXAM Carotid Bruits Left Right   Positive Negative    Radial pulses are 2+ palpable and equal.                                                                                                                            LE Pulses LEFT RIGHT       POPLITEAL  not palpable   not palpable       POSTERIOR TIBIAL   palpable    palpable        DORSALIS PEDIS      ANTERIOR TIBIAL  palpable   palpable     Gastrointestinal: soft, nontender, BS WNL, no r/g,  negative masses.  Musculoskeletal: Negative muscle atrophy/wasting. M/S 5/5 throughout, Extremities without ischemic changes.  Neurologic: A&O X 3; Appropriate Affect ; SENSATION ;normal;  Speech is normal CN 2-12 intact, Pain and light touch intact in extremities, Motor exam as listed above.   Non-Invasive Vascular Imaging CAROTID DUPLEX 04/06/2013   CEREBROVASCULAR  DUPLEX EVALUATION    INDICATION: Carotid stenosis    PREVIOUS INTERVENTION(S): N/A    DUPLEX EXAM:     RIGHT  LEFT  Peak Systolic Velocities (cm/s) End Diastolic Velocities (cm/s) Plaque LOCATION Peak Systolic Velocities (cm/s) End Diastolic Velocities (cm/s) Plaque  80 14  CCA PROXIMAL 65 16   74 20  HT CCA MID 70 21 HT  89 24 HT CCA DISTAL 94 30 HT  137 24 HT ECA 241 25 CP  244 73 HT ICA PROXIMAL 391 133 CP  108 36  ICA MID 162 38 HT  76 22  ICA DISTAL 92 25     3.30 ICA / CCA Ratio (PSV) 5.60  Antegrade Vertebral Flow Antegrade  094 Brachial Systolic Pressure (mmHg) 709  Triphasic Brachial Artery Waveforms Triphasic    Plaque Morphology:  HM = Homogeneous, HT = Heterogeneous, CP = Calcific Plaque, SP = Smooth Plaque, IP = Irregular Plaque     ADDITIONAL FINDINGS:     IMPRESSION: Right internal carotid artery stenosis present in the 60%-79% range. Left internal carotid artery stenosis present in the 80%-99% range with calcific plaque and vessel tortuousity distal to stenosis. Bilateral external carotid artery stenosis present. Blood pressure gradient present however bilateral antegrade vertebral arteries are present.    Compared to the previous exam:  Stable on the right and increased on the left since previous study on 10/06/2012.      Assessment: Andrea Kirk is a 73 y.o. female who presents with asymptomatic progression of her left internal carotid artery stenosis.  Right internal carotid artery stenosis present in the 60%-79% range. Left internal carotid artery stenosis present in the 80%-99% range with calcific plaque and vessel tortuousity distal to stenosis. Bilateral external carotid artery stenosis present. Blood pressure gradient present however bilateral antegrade vertebral arteries are present. Stable on the right and increased on the left since previous study on 10/06/2012. The  Left ICA stenosis is  Worse from previous exam. Dr. Donnetta Hutching spoke with and examined patient, he also spoke with patient's brother by phone who is a retired Engineer, drilling.  Plan:   Schedule for left CEA, patient to decide date.  I discussed in depth with the patient the nature of atherosclerosis, and emphasized the importance of maximal medical management including strict control of blood  pressure, blood glucose, and lipid levels, obtaining regular exercise, and continued cessation of smoking.  The patient is aware that without maximal medical management the underlying atherosclerotic disease process will progress, limiting the benefit of any interventions. The patient was given information about stroke prevention and what symptoms should prompt the patient to seek immediate medical care. Thank you for allowing [place] to participate in this patient's care.  Clemon Chambers, RN, MSN, FNP-C [facility] Office: [phone]  Clinic Physician: Early  04/06/2013 10:41 AM

## 2013-04-06 NOTE — Patient Instructions (Addendum)
Stroke Prevention Some medical conditions and behaviors are associated with an increased chance of having a stroke. You may prevent a stroke by making healthy choices and managing medical conditions. HOW CAN I REDUCE MY RISK OF HAVING A STROKE?   Stay physically active. Get at least 30 minutes of activity on most or all days.  Do not smoke. It may also be helpful to avoid exposure to secondhand smoke.  Limit alcohol use. Moderate alcohol use is considered to be:  No more than 2 drinks per day for men.  No more than 1 drink per day for nonpregnant women.  Eat healthy foods. This involves  Eating 5 or more servings of fruits and vegetables a day.  Following a diet that addresses high blood pressure (hypertension), high cholesterol, diabetes, or obesity.  Manage your cholesterol levels.  A diet low in saturated fat, trans fat, and cholesterol and high in fiber may control cholesterol levels.  Take any prescribed medicines to control cholesterol as directed by your health care provider.  Manage your diabetes.  A controlled-carbohydrate, controlled-sugar diet is recommended to manage diabetes.  Take any prescribed medicines to control diabetes as directed by your health care provider.  Control your hypertension.  A low-salt (sodium), low-saturated fat, low-trans fat, and low-cholesterol diet is recommended to manage hypertension.  Take any prescribed medicines to control hypertension as directed by your health care provider.  Maintain a healthy weight.  A reduced-calorie, low-sodium, low-saturated fat, low-trans fat, low-cholesterol diet is recommended to manage weight.  Stop drug abuse.  Avoid taking birth control pills.  Talk to your health care provider about the risks of taking birth control pills if you are over 40 years old, smoke, get migraines, or have ever had a blood clot.  Get evaluated for sleep disorders (sleep apnea).  Talk to your health care provider about  getting a sleep evaluation if you snore a lot or have excessive sleepiness.  Take medicines as directed by your health care provider.  For some people, aspirin or blood thinners (anticoagulants) are helpful in reducing the risk of forming abnormal blood clots that can lead to stroke. If you have the irregular heart rhythm of atrial fibrillation, you should be on a blood thinner unless there is a good reason you cannot take them.  Understand all your medicine instructions.  Make sure that other other conditions (such as anemia or atherosclerosis) are addressed. SEEK IMMEDIATE MEDICAL CARE IF:   You have sudden weakness or numbness of the face, arm, or leg, especially on one side of the body.  Your face or eyelid droops to one side.  You have sudden confusion.  You have trouble speaking (aphasia) or understanding.  You have sudden trouble seeing in one or both eyes.  You have sudden trouble walking.  You have dizziness.  You have a loss of balance or coordination.  You have a sudden, severe headache with no known cause.  You have new chest pain or an irregular heartbeat. Any of these symptoms may represent a serious problem that is an emergency. Do not wait to see if the symptoms will go away. Get medical help at once. Call your local emergency services  (911 in [place] Do not drive yourself to the hospital. Document Released: 02/01/2004 Document Revised: 10/14/2012 Document Reviewed: 06/26/2012 [facility] Patient Information 2014 [place].   Carotid Endarterectomy  A carotid endarterectomy is a surgery to remove a blockage in the carotid arteries. The carotid arteries are the large blood vessels  on both sides of the neck that supply blood to the brain. Carotid artery disease, also called carotid artery stenosis, is the narrowing or blockage of one or both carotid arteries. Carotid artery disease is usually caused by atherosclerosis, which is a buildup of fat and plaque in the  arteries. Some plaque buildup normally occurs with aging. The plaque may partially or totally block blood flow or cause a clot to form in the carotid arteries.  LET [facility] CARE PROVIDER KNOW ABOUT:  Any allergies you have.   All medicines you are taking, including vitamins, herbs, eye drops, creams, and over-the-counter medicines.   Use of steroids (by mouth or creams).   Previous problems you or members of your family have had with the use of anesthetics.   Any blood disorders you have.   Previous surgeries you have had.   Medical conditions you have, including diabetes and kidney problems.   Possibility of pregnancy, if this applies.  RISKS AND COMPLICATIONS Generally, carotid endarterectomy is a safe procedure. However, as with any procedure, complications can occur. Possible complications include:  Bleeding.   Infection.   Transient ischemic attacks (TIA). A TIA results from poor blood flow to the brain, but there is no permanent loss of brain function.   Stroke.   Heart attack (myocardial infarction).   High blood pressure (hypertension).   Injury to nerves near the carotid arteries.  BEFORE THE PROCEDURE   Ask your health care provider about changing or stopping your regular medicines.  Do not eat or drink anything for at least 8 hours before the surgery. Ask your health care provider if it is okay to take any needed medicines with a sip of water.   Do not smoke for as long as possible before the surgery. Smoking will increase the chance of a healing problem after surgery.   You may need to have blood tests, a test to check heart rhythm (electrocardiography), or a test to check blood flow (angiography) done before the surgery.  PROCEDURE  You will be given medicine to make you sleep through the procedure (general anesthetic). After you receive the anesthetic, the surgeon will make a small cut (incision) in your neck to expose the carotid  artery. A tube may be inserted into the carotid artery above and below the blockage. This tube will temporarily allow blood to flow around the blockage during the surgery. An incision is made in the carotid artery at the location of the blockage, and the blockage is removed. In some cases, a section of the carotid artery may be removed and a graft patch used to repair the artery. The carotid artery is closed with stitches (sutures). If a tube was inserted into the artery to allow blood flow around the blockage during surgery, the tube will be removed. With the tube removal, blood flow to the brain is restored through the carotid artery. The incision in the neck is then closed with sutures.  AFTER THE PROCEDURE  You will likely stay in the hospital for 1 to 3 days after the surgery. You may have some pain or an ache in your neck for a couple weeks. This is normal.  Document Released: 08/26/2012 Document Reviewed: 08/26/2012 [facility] Patient Information 2014 [place].

## 2013-04-07 ENCOUNTER — Telehealth: Payer: Self-pay | Admitting: Interventional Cardiology

## 2013-04-07 ENCOUNTER — Encounter (HOSPITAL_COMMUNITY): Payer: Self-pay | Admitting: Pharmacy Technician

## 2013-04-07 NOTE — Pre-Procedure Instructions (Signed)
Katrin Housh  04/07/2013   Your procedure is scheduled on:  Fri, April 10 @ 7:30 AM  Report to [facility]  at 5:30 AM.  Call this number if you have problems the morning of surgery: [phone]   Remember:   Do not eat food or drink liquids after midnight.   Take these medicines the morning of surgery with A SIP OF WATER: Carvedilol(Coreg),Celexa(Citalopram),Pain Pill(if needed),Claritin(Loratadine-if needed),and Antivert(Meclizine-if needed)              Stop taking your Plavix as instructed. Also stop taking your Ibuprofen and Fish Oil. No Goody's,BC's,Aleve,or any Herbal Medications   Do not wear jewelry, make-up or nail polish.  Do not wear lotions, powders, or perfumes. You may wear deodorant.  Do not shave 48 hours prior to surgery.   Do not bring valuables to the hospital.  [facility] is not responsible                  for any belongings or valuables.               Contacts, dentures or bridgework may not be worn into surgery.  Leave suitcase in the car. After surgery it may be brought to your room.  For patients admitted to the hospital, discharge time is determined by your                treatment team.               Patients discharged the day of surgery will not be allowed to drive  home.    Special Instructions:  [facility] - Preparing for Surgery  Before surgery, you can play an important role.  Because skin is not sterile, your skin needs to be as free of germs as possible.  You can reduce the number of germs on you skin by washing with CHG (chlorahexidine gluconate) soap before surgery.  CHG is an antiseptic cleaner which kills germs and bonds with the skin to continue killing germs even after washing.  Please DO NOT use if you have an allergy to CHG or antibacterial soaps.  If your skin becomes reddened/irritated stop using the CHG and inform your nurse when you arrive at Short Stay.  Do not shave (including legs and underarms) for at least 48 hours prior  to the first CHG shower.  You may shave your face.  Please follow these instructions carefully:   1.  Shower with CHG Soap the night before surgery and the                                morning of Surgery.  2.  If you choose to wash your hair, wash your hair first as usual with your       normal shampoo.  3.  After you shampoo, rinse your hair and body thoroughly to remove the                      Shampoo.  4.  Use CHG as you would any other liquid soap.  You can apply chg directly       to the skin and wash gently with scrungie or a clean washcloth.  5.  Apply the CHG Soap to your body ONLY FROM THE NECK DOWN.        Do not use on open wounds or open sores.  Avoid contact  with your eyes,       ears, mouth and genitals (private parts).  Wash genitals (private parts)       with your normal soap.  6.  Wash thoroughly, paying special attention to the area where your surgery        will be performed.  7.  Thoroughly rinse your body with warm water from the neck down.  8.  DO NOT shower/wash with your normal soap after using and rinsing off       the CHG Soap.  9.  Pat yourself dry with a clean towel.            10.  Wear clean pajamas.            11.  Place clean sheets on your bed the night of your first shower and do not        sleep with pets.  Day of Surgery  Do not apply any lotions/deoderants the morning of surgery.  Please wear clean clothes to the hospital/surgery center.     Please read over the following fact sheets that you were given: Pain Booklet, Coughing and Deep Breathing, Blood Transfusion Information, MRSA Information and Surgical Site Infection Prevention

## 2013-04-07 NOTE — Telephone Encounter (Signed)
New message     FYI Having surgery on her carotid artery on 04-16-13 by Dr Donnetta Hutching.  Pt is stopping plavix on fri the 3rd.

## 2013-04-07 NOTE — Telephone Encounter (Signed)
To Dr. Varanasi. 

## 2013-04-08 ENCOUNTER — Ambulatory Visit (HOSPITAL_COMMUNITY)
Admission: RE | Admit: 2013-04-08 | Discharge: 2013-04-08 | Disposition: A | Discharge status: Home / Self Care | Payer: Medicare Other | Source: Ambulatory Visit | Attending: Anesthesiology | Admitting: Anesthesiology

## 2013-04-08 ENCOUNTER — Encounter (HOSPITAL_COMMUNITY): Payer: Self-pay

## 2013-04-08 ENCOUNTER — Encounter (HOSPITAL_COMMUNITY)
Admission: RE | Admit: 2013-04-08 | Discharge: 2013-04-08 | Disposition: A | Discharge status: Home / Self Care | Payer: Medicare Other | Source: Ambulatory Visit | Attending: Vascular Surgery | Admitting: Vascular Surgery

## 2013-04-08 DIAGNOSIS — Z01812 Encounter for preprocedural laboratory examination: Secondary | ICD-10-CM | POA: Diagnosis not present

## 2013-04-08 DIAGNOSIS — Z01818 Encounter for other preprocedural examination: Secondary | ICD-10-CM | POA: Insufficient documentation

## 2013-04-08 DIAGNOSIS — Z7901 Long term (current) use of anticoagulants: Secondary | ICD-10-CM | POA: Insufficient documentation

## 2013-04-08 HISTORY — DX: Headache: R51

## 2013-04-08 HISTORY — DX: Other muscle spasm: M62.838

## 2013-04-08 HISTORY — DX: Gastro-esophageal reflux disease without esophagitis: K21.9

## 2013-04-08 HISTORY — DX: Shortness of breath: R06.02

## 2013-04-08 HISTORY — DX: Other seasonal allergic rhinitis: J30.2

## 2013-04-08 HISTORY — DX: Dizziness and giddiness: R42

## 2013-04-08 HISTORY — DX: Urgency of urination: R39.15

## 2013-04-08 HISTORY — DX: Dorsalgia, unspecified: M54.9

## 2013-04-08 HISTORY — DX: Nausea: R11.0

## 2013-04-08 HISTORY — DX: Pneumonia, unspecified organism: J18.9

## 2013-04-08 HISTORY — DX: Unspecified cataract: H26.9

## 2013-04-08 HISTORY — DX: Restless legs syndrome: G25.81

## 2013-04-08 LAB — COMPREHENSIVE METABOLIC PANEL
ALT: 31 U/L (ref 0–35)
AST: 27 U/L (ref 0–37)
Albumin: 3.7 g/dL (ref 3.5–5.2)
Alkaline Phosphatase: 75 U/L (ref 39–117)
BUN: 15 mg/dL (ref 6–23)
CO2: 26 mEq/L (ref 19–32)
Calcium: 9.9 mg/dL (ref 8.4–10.5)
Chloride: 104 mEq/L (ref 96–112)
Creatinine, Ser: 0.59 mg/dL (ref 0.50–1.10)
GFR calc Af Amer: >90 mL/min (ref >90)
GFR calc non Af Amer: >90 mL/min (ref >90)
Glucose, Bld: 107 mg/dL — ABNORMAL HIGH (ref 70–99)
Potassium: 4.5 mEq/L (ref 3.7–5.3)
Sodium: 142 mEq/L (ref 137–147)
Total Bilirubin: 0.3 mg/dL (ref 0.3–1.2)
Total Protein: 6.8 g/dL (ref 6.0–8.3)

## 2013-04-08 LAB — URINALYSIS, ROUTINE W REFLEX MICROSCOPIC
Bilirubin Urine: NEGATIVE
Glucose, UA: NEGATIVE mg/dL
Hgb urine dipstick: NEGATIVE
Ketones, ur: NEGATIVE mg/dL
Leukocytes, UA: NEGATIVE
Nitrite: NEGATIVE
Protein, ur: NEGATIVE mg/dL
Specific Gravity, Urine: 1.027 (ref 1.005–1.030)
Urobilinogen, UA: 0.2 mg/dL (ref 0.0–1.0)
pH: 5.5 (ref 5.0–8.0)

## 2013-04-08 LAB — CBC
HCT: 35.9 % — ABNORMAL LOW (ref 36.0–46.0)
Hemoglobin: 12.1 g/dL (ref 12.0–15.0)
MCH: 32.8 pg (ref 26.0–34.0)
MCHC: 33.7 g/dL (ref 30.0–36.0)
MCV: 97.3 fL (ref 78.0–100.0)
Platelets: 178 10*3/uL (ref 150–400)
RBC: 3.69 MIL/uL — ABNORMAL LOW (ref 3.87–5.11)
RDW: 13.1 % (ref 11.5–15.5)
WBC: 7.5 10*3/uL (ref 4.0–10.5)

## 2013-04-08 LAB — PROTIME-INR
INR: 1.01 (ref 0.00–1.49)
Prothrombin Time: 13.1 seconds (ref 11.6–15.2)

## 2013-04-08 LAB — TYPE AND SCREEN
ABO/RH(D): A POS
Antibody Screen: NEGATIVE

## 2013-04-08 LAB — APTT: aPTT: 26 seconds (ref 24–37)

## 2013-04-08 LAB — SURGICAL PCR SCREEN
MRSA, PCR: NEGATIVE
Staphylococcus aureus: NEGATIVE

## 2013-04-08 MED ORDER — CHLORHEXIDINE GLUCONATE 4 % EX LIQD: 60.0000 mL | Freq: Once | CUTANEOUS | Status: DC

## 2013-04-08 NOTE — Progress Notes (Signed)
Takes Lisinopril to protect kidneys

## 2013-04-08 NOTE — Progress Notes (Signed)
Cardiologist is Dr.Varanasi with last visit in epic from 03/2013  Denies ever having an echo  Heart cath done prior to [place] test reports in epic from 2004 and 2012  EKG in epic from 03-11-13  Denies CXR in past yr  [place] on [address]

## 2013-04-09 NOTE — Progress Notes (Signed)
Anesthesia chart review: Patient is a 73 year old female scheduled for left carotid endarterectomy on 04/16/13 by Dr. Curt Jews.   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 PCP is Luanne Bras, FNP.  Cardiologist is Dr. Irish Lack, last visit 03/11/13.  No cardiac testing was ordered. His office has been notified that patient will be holding Plavix on 04/09/13 in anticipation of surgery.  EKG (03/11/13; Dr. Irish Lack): Normal sinus rhythm, inferior Q waves, no ST segment changes. Inferior Q waves were present since at least 05/2011.  Nuclear stress test on 05/09/10 showed: Normal myocardial perfusion study.  Post-stress EF 64%. Low risk scan.  Carotid duplex on 04/06/13 showed: Right internal carotid artery stenosis present in the 60%-79% range. Left internal carotid artery stenosis present in the 80%-99% range with calcific plaque and vessel tortuousity distal to stenosis. Bilateral external carotid artery stenosis present. Blood pressure gradient present however bilateral antegrade vertebral arteries are present.   Preoperative CXR and labs noted.   She has had recent cardiology follow-up with no CV symptoms reported.  His office was notified of plans to hold Plavix preoperatively.  If no acute changes then I would anticipate that she could proceed as planned.  George Hugh [facility] Short Stay Center/Anesthesiology Phone [phone] 04/09/2013 11:43 AM

## 2013-04-15 MED ORDER — DEXTROSE 5 % IV SOLN
1.5000 g | INTRAVENOUS | Status: AC
  Administered 2013-04-16: 1.5 g via INTRAVENOUS
  Filled 2013-04-15: qty 1.5

## 2013-04-16 ENCOUNTER — Encounter (HOSPITAL_COMMUNITY)
Admission: RE | Disposition: A | Discharge status: Home / Self Care | Payer: Self-pay | Source: Ambulatory Visit | Attending: Vascular Surgery

## 2013-04-16 ENCOUNTER — Inpatient Hospital Stay (HOSPITAL_COMMUNITY)
Admission: RE | Admit: 2013-04-16 | Discharge: 2013-04-17 | DRG: 039 | Disposition: A | Discharge status: Home / Self Care | Payer: Medicare Other | Source: Ambulatory Visit | Attending: Vascular Surgery | Admitting: Vascular Surgery

## 2013-04-16 ENCOUNTER — Encounter (HOSPITAL_COMMUNITY): Payer: Self-pay | Admitting: *Deleted

## 2013-04-16 ENCOUNTER — Encounter (HOSPITAL_COMMUNITY): Payer: Medicare Other | Admitting: Vascular Surgery

## 2013-04-16 ENCOUNTER — Inpatient Hospital Stay (HOSPITAL_COMMUNITY): Payer: Medicare Other | Admitting: Certified Registered"

## 2013-04-16 DIAGNOSIS — Z951 Presence of aortocoronary bypass graft: Secondary | ICD-10-CM | POA: Diagnosis not present

## 2013-04-16 DIAGNOSIS — I6529 Occlusion and stenosis of unspecified carotid artery: Principal | ICD-10-CM

## 2013-04-16 DIAGNOSIS — K219 Gastro-esophageal reflux disease without esophagitis: Secondary | ICD-10-CM | POA: Diagnosis not present

## 2013-04-16 DIAGNOSIS — Z8249 Family history of ischemic heart disease and other diseases of the circulatory system: Secondary | ICD-10-CM | POA: Diagnosis not present

## 2013-04-16 DIAGNOSIS — F411 Generalized anxiety disorder: Secondary | ICD-10-CM | POA: Diagnosis present

## 2013-04-16 DIAGNOSIS — I10 Essential (primary) hypertension: Secondary | ICD-10-CM | POA: Diagnosis not present

## 2013-04-16 DIAGNOSIS — E119 Type 2 diabetes mellitus without complications: Secondary | ICD-10-CM | POA: Diagnosis present

## 2013-04-16 DIAGNOSIS — I658 Occlusion and stenosis of other precerebral arteries: Secondary | ICD-10-CM | POA: Diagnosis present

## 2013-04-16 DIAGNOSIS — I209 Angina pectoris, unspecified: Secondary | ICD-10-CM | POA: Diagnosis present

## 2013-04-16 DIAGNOSIS — E785 Hyperlipidemia, unspecified: Secondary | ICD-10-CM | POA: Diagnosis not present

## 2013-04-16 DIAGNOSIS — I739 Peripheral vascular disease, unspecified: Secondary | ICD-10-CM

## 2013-04-16 DIAGNOSIS — Z947 Corneal transplant status: Secondary | ICD-10-CM

## 2013-04-16 DIAGNOSIS — Z7982 Long term (current) use of aspirin: Secondary | ICD-10-CM | POA: Diagnosis not present

## 2013-04-16 DIAGNOSIS — I2510 Atherosclerotic heart disease of native coronary artery without angina pectoris: Secondary | ICD-10-CM | POA: Diagnosis present

## 2013-04-16 DIAGNOSIS — I779 Disorder of arteries and arterioles, unspecified: Secondary | ICD-10-CM | POA: Diagnosis present

## 2013-04-16 HISTORY — DX: Disorder of arteries and arterioles, unspecified: I77.9

## 2013-04-16 HISTORY — PX: ENDARTERECTOMY: SHX5162

## 2013-04-16 LAB — CREATININE, SERUM
Creatinine, Ser: 0.52 mg/dL (ref 0.50–1.10)
GFR calc Af Amer: >90 mL/min (ref >90)
GFR calc non Af Amer: >90 mL/min (ref >90)

## 2013-04-16 LAB — GLUCOSE, CAPILLARY
Glucose-Capillary: 212 mg/dL — ABNORMAL HIGH (ref 70–99)
Glucose-Capillary: 240 mg/dL — ABNORMAL HIGH (ref 70–99)
Glucose-Capillary: 203 mg/dL — ABNORMAL HIGH (ref 70–99)

## 2013-04-16 LAB — CBC
HCT: 33.6 % — ABNORMAL LOW (ref 36.0–46.0)
Hemoglobin: 11.5 g/dL — ABNORMAL LOW (ref 12.0–15.0)
MCH: 32.5 pg (ref 26.0–34.0)
MCHC: 34.2 g/dL (ref 30.0–36.0)
MCV: 94.9 fL (ref 78.0–100.0)
Platelets: 154 10*3/uL (ref 150–400)
RBC: 3.54 MIL/uL — ABNORMAL LOW (ref 3.87–5.11)
RDW: 12.9 % (ref 11.5–15.5)
WBC: 8.2 10*3/uL (ref 4.0–10.5)

## 2013-04-16 SURGERY — ENDARTERECTOMY, CAROTID
Anesthesia: General | Site: Neck | Laterality: Left

## 2013-04-16 MED ORDER — PROPOFOL 10 MG/ML IV BOLUS
INTRAVENOUS | Status: DC | PRN
  Administered 2013-04-16: 130 mg via INTRAVENOUS
  Administered 2013-04-16: 50 mg via INTRAVENOUS

## 2013-04-16 MED ORDER — PROPOFOL 10 MG/ML IV BOLUS
INTRAVENOUS | Status: AC
Start: 2013-04-16 — End: 2013-04-16
  Filled 2013-04-16: qty 20

## 2013-04-16 MED ORDER — SUFENTANIL CITRATE 50 MCG/ML IV SOLN
INTRAVENOUS | Status: AC
  Filled 2013-04-16: qty 1

## 2013-04-16 MED ORDER — SODIUM CHLORIDE 0.9 % IV SOLN: 500.0000 mL | Freq: Once | INTRAVENOUS | Status: AC | PRN

## 2013-04-16 MED ORDER — HEPARIN SODIUM (PORCINE) 1000 UNIT/ML IJ SOLN
INTRAMUSCULAR | Status: DC | PRN
  Administered 2013-04-16: 8000 [IU] via INTRAVENOUS

## 2013-04-16 MED ORDER — GLYCOPYRROLATE 0.2 MG/ML IJ SOLN
INTRAMUSCULAR | Status: AC
  Filled 2013-04-16: qty 1

## 2013-04-16 MED ORDER — LISINOPRIL 20 MG PO TABS
20.0000 mg | ORAL_TABLET | Freq: Every day | ORAL | Status: DC
  Administered 2013-04-16: 20 mg via ORAL
  Filled 2013-04-16 (×2): qty 1

## 2013-04-16 MED ORDER — HEPARIN SODIUM (PORCINE) 1000 UNIT/ML IJ SOLN
INTRAMUSCULAR | Status: AC
  Filled 2013-04-16: qty 1

## 2013-04-16 MED ORDER — CARVEDILOL 12.5 MG PO TABS
ORAL_TABLET | ORAL | Status: AC
Start: 2013-04-16 — End: 2013-04-16
  Filled 2013-04-16: qty 2

## 2013-04-16 MED ORDER — ROPINIROLE HCL 0.25 MG PO TABS
0.2500 mg | ORAL_TABLET | Freq: Three times a day (TID) | ORAL | Status: DC | PRN
  Filled 2013-04-16: qty 1

## 2013-04-16 MED ORDER — ONDANSETRON HCL 4 MG/2ML IJ SOLN
INTRAMUSCULAR | Status: AC
Start: 2013-04-16 — End: 2013-04-16
  Filled 2013-04-16: qty 2

## 2013-04-16 MED ORDER — KCL IN DEXTROSE-NACL 20-5-0.45 MEQ/L-%-% IV SOLN
INTRAVENOUS | Status: DC
  Filled 2013-04-16: qty 1000

## 2013-04-16 MED ORDER — ATORVASTATIN CALCIUM 40 MG PO TABS
40.0000 mg | ORAL_TABLET | Freq: Every day | ORAL | Status: DC
  Administered 2013-04-16: 40 mg via ORAL
  Filled 2013-04-16 (×2): qty 1

## 2013-04-16 MED ORDER — ASPIRIN EC 81 MG PO TBEC: 81.0000 mg | DELAYED_RELEASE_TABLET | Freq: Every evening | ORAL | Status: DC

## 2013-04-16 MED ORDER — 0.9 % SODIUM CHLORIDE (POUR BTL) OPTIME
TOPICAL | Status: DC | PRN
  Administered 2013-04-16 (×2): 1000 mL

## 2013-04-16 MED ORDER — PANTOPRAZOLE SODIUM 40 MG PO TBEC
40.0000 mg | DELAYED_RELEASE_TABLET | Freq: Every day | ORAL | Status: DC
  Administered 2013-04-16 – 2013-04-17 (×2): 40 mg via ORAL
  Filled 2013-04-16 (×2): qty 1

## 2013-04-16 MED ORDER — FENTANYL CITRATE 0.05 MG/ML IJ SOLN
INTRAMUSCULAR | Status: AC
  Administered 2013-04-16: 13:00:00
  Filled 2013-04-16: qty 2

## 2013-04-16 MED ORDER — HYDRALAZINE HCL 20 MG/ML IJ SOLN
10.0000 mg | INTRAMUSCULAR | Status: DC | PRN
  Administered 2013-04-16: 10 mg via INTRAVENOUS
  Filled 2013-04-16 (×2): qty 1
  Filled 2013-04-16: qty 0.5

## 2013-04-16 MED ORDER — FENTANYL CITRATE 0.05 MG/ML IJ SOLN
25.0000 ug | INTRAMUSCULAR | Status: DC | PRN
  Administered 2013-04-16: 25 ug via INTRAVENOUS
  Administered 2013-04-16 (×2): 50 ug via INTRAVENOUS

## 2013-04-16 MED ORDER — INSULIN ASPART 100 UNIT/ML ~~LOC~~ SOLN: 5.0000 [IU] | SUBCUTANEOUS | Status: DC

## 2013-04-16 MED ORDER — ONDANSETRON HCL 4 MG/2ML IJ SOLN
INTRAMUSCULAR | Status: DC | PRN
  Administered 2013-04-16: 4 mg via INTRAVENOUS

## 2013-04-16 MED ORDER — OXYMETAZOLINE HCL 0.05 % NA SOLN
2.0000 | Freq: Every day | NASAL | Status: DC | PRN
  Administered 2013-04-16 – 2013-04-17 (×3): 2 via NASAL
  Filled 2013-04-16 (×3): qty 15

## 2013-04-16 MED ORDER — LIDOCAINE HCL (CARDIAC) 20 MG/ML IV SOLN
INTRAVENOUS | Status: AC
  Filled 2013-04-16: qty 5

## 2013-04-16 MED ORDER — GLYCOPYRROLATE 0.2 MG/ML IJ SOLN
INTRAMUSCULAR | Status: DC | PRN
  Administered 2013-04-16: 0.2 mg via INTRAVENOUS

## 2013-04-16 MED ORDER — PHENOL 1.4 % MT LIQD: 1.0000 | OROMUCOSAL | Status: DC | PRN

## 2013-04-16 MED ORDER — PROTAMINE SULFATE 10 MG/ML IV SOLN
INTRAVENOUS | Status: DC | PRN
Start: 2013-04-16 — End: 2013-04-16
  Administered 2013-04-16: 50 mg via INTRAVENOUS

## 2013-04-16 MED ORDER — ACETAMINOPHEN 325 MG PO TABS
325.0000 mg | ORAL_TABLET | ORAL | Status: DC | PRN
  Administered 2013-04-16: 325 mg via ORAL
  Administered 2013-04-17 (×2): 650 mg via ORAL
  Filled 2013-04-16 (×2): qty 2
  Filled 2013-04-16: qty 1

## 2013-04-16 MED ORDER — ROCURONIUM BROMIDE 50 MG/5ML IV SOLN
INTRAVENOUS | Status: AC
  Filled 2013-04-16: qty 1

## 2013-04-16 MED ORDER — LORATADINE 10 MG PO TABS
10.0000 mg | ORAL_TABLET | Freq: Every day | ORAL | Status: DC | PRN
  Filled 2013-04-16: qty 1

## 2013-04-16 MED ORDER — SODIUM CHLORIDE 0.9 % IR SOLN
Status: DC | PRN
  Administered 2013-04-16: 07:00:00

## 2013-04-16 MED ORDER — LIDOCAINE HCL (CARDIAC) 20 MG/ML IV SOLN
INTRAVENOUS | Status: DC | PRN
  Administered 2013-04-16: 40 mg via INTRAVENOUS

## 2013-04-16 MED ORDER — ALUM & MAG HYDROXIDE-SIMETH 200-200-20 MG/5ML PO SUSP: 15.0000 mL | ORAL | Status: DC | PRN

## 2013-04-16 MED ORDER — NITROGLYCERIN 0.4 MG SL SUBL: 0.4000 mg | SUBLINGUAL_TABLET | SUBLINGUAL | Status: DC | PRN

## 2013-04-16 MED ORDER — SUFENTANIL CITRATE 50 MCG/ML IV SOLN
INTRAVENOUS | Status: DC | PRN
  Administered 2013-04-16 (×2): 10 ug via INTRAVENOUS
  Administered 2013-04-16 (×4): 5 ug via INTRAVENOUS

## 2013-04-16 MED ORDER — ONDANSETRON HCL 4 MG/2ML IJ SOLN: 4.0000 mg | Freq: Four times a day (QID) | INTRAMUSCULAR | Status: DC | PRN

## 2013-04-16 MED ORDER — ENOXAPARIN SODIUM 40 MG/0.4ML ~~LOC~~ SOLN
40.0000 mg | SUBCUTANEOUS | Status: DC
  Administered 2013-04-17: 40 mg via SUBCUTANEOUS
  Filled 2013-04-16 (×2): qty 0.4

## 2013-04-16 MED ORDER — GLIPIZIDE ER 10 MG PO TB24
10.0000 mg | ORAL_TABLET | Freq: Every day | ORAL | Status: DC
  Administered 2013-04-16 – 2013-04-17 (×2): 10 mg via ORAL
  Filled 2013-04-16 (×2): qty 1

## 2013-04-16 MED ORDER — SUCCINYLCHOLINE CHLORIDE 20 MG/ML IJ SOLN
INTRAMUSCULAR | Status: AC
  Filled 2013-04-16: qty 1

## 2013-04-16 MED ORDER — DOCUSATE SODIUM 100 MG PO CAPS
100.0000 mg | ORAL_CAPSULE | Freq: Every day | ORAL | Status: DC
  Administered 2013-04-17: 100 mg via ORAL
  Filled 2013-04-16: qty 1

## 2013-04-16 MED ORDER — SODIUM CHLORIDE 0.9 % IJ SOLN
INTRAMUSCULAR | Status: AC
  Filled 2013-04-16: qty 10

## 2013-04-16 MED ORDER — INSULIN ASPART 100 UNIT/ML ~~LOC~~ SOLN
SUBCUTANEOUS | Status: AC
  Administered 2013-04-16: 5 [IU] via SUBCUTANEOUS
  Filled 2013-04-16: qty 5

## 2013-04-16 MED ORDER — SODIUM CHLORIDE 0.9 % IV SOLN: INTRAVENOUS | Status: DC

## 2013-04-16 MED ORDER — ROCURONIUM BROMIDE 100 MG/10ML IV SOLN
INTRAVENOUS | Status: DC | PRN
  Administered 2013-04-16: 45 mg via INTRAVENOUS

## 2013-04-16 MED ORDER — LIDOCAINE HCL (PF) 1 % IJ SOLN
INTRAMUSCULAR | Status: AC
  Filled 2013-04-16: qty 30

## 2013-04-16 MED ORDER — NEOSTIGMINE METHYLSULFATE 1 MG/ML IJ SOLN
INTRAMUSCULAR | Status: DC | PRN
  Administered 2013-04-16: 2 mg via INTRAVENOUS

## 2013-04-16 MED ORDER — GUAIFENESIN-DM 100-10 MG/5ML PO SYRP: 15.0000 mL | ORAL_SOLUTION | ORAL | Status: DC | PRN

## 2013-04-16 MED ORDER — CARVEDILOL 25 MG PO TABS
25.0000 mg | ORAL_TABLET | Freq: Two times a day (BID) | ORAL | Status: AC
Start: 2013-04-16 — End: 2013-04-16
  Administered 2013-04-16: 25 mg via ORAL

## 2013-04-16 MED ORDER — PROTAMINE SULFATE 10 MG/ML IV SOLN
INTRAVENOUS | Status: AC
  Filled 2013-04-16: qty 5

## 2013-04-16 MED ORDER — OXYCODONE HCL 5 MG PO TABS: 5.0000 mg | ORAL_TABLET | Freq: Once | ORAL | Status: AC | PRN

## 2013-04-16 MED ORDER — OXYCODONE HCL 5 MG/5ML PO SOLN
5.0000 mg | Freq: Once | ORAL | Status: AC | PRN
  Administered 2013-04-16: 5 mg via ORAL

## 2013-04-16 MED ORDER — LACTATED RINGERS IV SOLN
INTRAVENOUS | Status: DC | PRN
  Administered 2013-04-16 (×2): via INTRAVENOUS

## 2013-04-16 MED ORDER — METOPROLOL TARTRATE 1 MG/ML IV SOLN
2.0000 mg | INTRAVENOUS | Status: DC | PRN
  Filled 2013-04-16: qty 5

## 2013-04-16 MED ORDER — LABETALOL HCL 5 MG/ML IV SOLN
INTRAVENOUS | Status: DC | PRN
  Administered 2013-04-16 (×2): 10 mg via INTRAVENOUS

## 2013-04-16 MED ORDER — LABETALOL HCL 5 MG/ML IV SOLN
10.0000 mg | INTRAVENOUS | Status: DC | PRN
  Administered 2013-04-16: 10 mg via INTRAVENOUS
  Filled 2013-04-16: qty 4

## 2013-04-16 MED ORDER — OXYCODONE HCL 5 MG/5ML PO SOLN
ORAL | Status: AC
  Administered 2013-04-16: 11:00:00
  Filled 2013-04-16: qty 5

## 2013-04-16 MED ORDER — CLOPIDOGREL BISULFATE 75 MG PO TABS
75.0000 mg | ORAL_TABLET | Freq: Every morning | ORAL | Status: DC
  Administered 2013-04-16 – 2013-04-17 (×2): 75 mg via ORAL
  Filled 2013-04-16 (×2): qty 1

## 2013-04-16 MED ORDER — ASPIRIN EC 325 MG PO TBEC
325.0000 mg | DELAYED_RELEASE_TABLET | Freq: Every day | ORAL | Status: DC
  Administered 2013-04-16 – 2013-04-17 (×2): 325 mg via ORAL
  Filled 2013-04-16 (×2): qty 1

## 2013-04-16 MED ORDER — METHOCARBAMOL 500 MG PO TABS
500.0000 mg | ORAL_TABLET | Freq: Three times a day (TID) | ORAL | Status: DC | PRN
  Administered 2013-04-17: 500 mg via ORAL
  Filled 2013-04-16 (×2): qty 1

## 2013-04-16 MED ORDER — CITALOPRAM HYDROBROMIDE 20 MG PO TABS
20.0000 mg | ORAL_TABLET | Freq: Every morning | ORAL | Status: DC
  Administered 2013-04-16 – 2013-04-17 (×2): 20 mg via ORAL
  Filled 2013-04-16 (×2): qty 1

## 2013-04-16 MED ORDER — DEXTROSE 5 % IV SOLN
1.5000 g | Freq: Two times a day (BID) | INTRAVENOUS | Status: AC
  Administered 2013-04-16 – 2013-04-17 (×2): 1.5 g via INTRAVENOUS
  Filled 2013-04-16 (×2): qty 1.5

## 2013-04-16 MED ORDER — POTASSIUM CHLORIDE CRYS ER 20 MEQ PO TBCR: 20.0000 meq | EXTENDED_RELEASE_TABLET | Freq: Once | ORAL | Status: AC | PRN

## 2013-04-16 MED ORDER — NEOSTIGMINE METHYLSULFATE 1 MG/ML IJ SOLN
INTRAMUSCULAR | Status: AC
  Filled 2013-04-16: qty 10

## 2013-04-16 MED ORDER — FENTANYL CITRATE 0.05 MG/ML IJ SOLN: 50.0000 ug | Freq: Once | INTRAMUSCULAR | Status: DC

## 2013-04-16 MED ORDER — FENTANYL CITRATE 0.05 MG/ML IJ SOLN
INTRAMUSCULAR | Status: AC
  Administered 2013-04-16: 11:00:00
  Filled 2013-04-16: qty 2

## 2013-04-16 MED ORDER — OXYCODONE-ACETAMINOPHEN 5-325 MG PO TABS
1.0000 | ORAL_TABLET | ORAL | Status: DC | PRN
  Administered 2013-04-16 – 2013-04-17 (×3): 2 via ORAL
  Filled 2013-04-16 (×3): qty 2

## 2013-04-16 MED ORDER — PHENYLEPHRINE HCL 10 MG/ML IJ SOLN
10.0000 mg | INTRAVENOUS | Status: DC | PRN
  Administered 2013-04-16: 20 ug/min via INTRAVENOUS

## 2013-04-16 MED ORDER — LABETALOL HCL 5 MG/ML IV SOLN
INTRAVENOUS | Status: AC
  Filled 2013-04-16: qty 4

## 2013-04-16 MED ORDER — METFORMIN HCL 500 MG PO TABS
1000.0000 mg | ORAL_TABLET | Freq: Two times a day (BID) | ORAL | Status: DC
  Administered 2013-04-16 – 2013-04-17 (×2): 1000 mg via ORAL
  Filled 2013-04-16 (×4): qty 2

## 2013-04-16 MED ORDER — ACETAMINOPHEN 650 MG RE SUPP: 325.0000 mg | RECTAL | Status: DC | PRN

## 2013-04-16 SURGICAL SUPPLY — 51 items
BENZOIN TINCTURE PRP APPL 2/3 (GAUZE/BANDAGES/DRESSINGS) ×2 IMPLANT
CANISTER SUCTION 2500CC (MISCELLANEOUS) ×2 IMPLANT
CATH ROBINSON RED A/P 18FR (CATHETERS) IMPLANT
CLIP LIGATING EXTRA MED SLVR (CLIP) ×2 IMPLANT
CLIP LIGATING EXTRA SM BLUE (MISCELLANEOUS) ×2 IMPLANT
COVER SURGICAL LIGHT HANDLE (MISCELLANEOUS) ×2 IMPLANT
CRADLE DONUT ADULT HEAD (MISCELLANEOUS) ×2 IMPLANT
DECANTER SPIKE VIAL GLASS SM (MISCELLANEOUS) IMPLANT
DERMABOND ADHESIVE PROPEN (GAUZE/BANDAGES/DRESSINGS) ×1
DERMABOND ADVANCED .7 DNX6 (GAUZE/BANDAGES/DRESSINGS) ×1 IMPLANT
DRAIN HEMOVAC 1/8 X 5 (WOUND CARE) IMPLANT
DRAPE WARM FLUID 44X44 (DRAPE) ×2 IMPLANT
DRSG COVADERM 4X6 (GAUZE/BANDAGES/DRESSINGS) ×2 IMPLANT
ELECT REM PT RETURN 9FT ADLT (ELECTROSURGICAL) ×2
ELECTRODE REM PT RTRN 9FT ADLT (ELECTROSURGICAL) ×1 IMPLANT
EVACUATOR SILICONE 100CC (DRAIN) IMPLANT
GEL ULTRASOUND 20GR AQUASONIC (MISCELLANEOUS) IMPLANT
GLOVE BIOGEL PI IND STRL 6.5 (GLOVE) ×1 IMPLANT
GLOVE BIOGEL PI IND STRL 7.0 (GLOVE) ×1 IMPLANT
GLOVE BIOGEL PI INDICATOR 6.5 (GLOVE) ×1
GLOVE BIOGEL PI INDICATOR 7.0 (GLOVE) ×1
GLOVE SKINSENSE NS SZ6.5 (GLOVE) ×2
GLOVE SKINSENSE NS SZ7.5 (GLOVE) ×1
GLOVE SKINSENSE STRL SZ6.0 (GLOVE) ×2 IMPLANT
GLOVE SKINSENSE STRL SZ6.5 (GLOVE) ×2 IMPLANT
GLOVE SKINSENSE STRL SZ7.5 (GLOVE) ×1 IMPLANT
GLOVE SS BIOGEL STRL SZ 7.5 (GLOVE) IMPLANT
GLOVE SUPERSENSE BIOGEL SZ 7.5 (GLOVE)
GOWN STRL REUS W/ TWL LRG LVL3 (GOWN DISPOSABLE) ×3 IMPLANT
GOWN STRL REUS W/TWL LRG LVL3 (GOWN DISPOSABLE) ×3
KIT BASIN OR (CUSTOM PROCEDURE TRAY) ×2 IMPLANT
KIT ROOM TURNOVER OR (KITS) ×2 IMPLANT
NEEDLE 22X1 1/2 (OR ONLY) (NEEDLE) IMPLANT
NS IRRIG 1000ML POUR BTL (IV SOLUTION) ×4 IMPLANT
PACK CAROTID (CUSTOM PROCEDURE TRAY) ×2 IMPLANT
PAD ARMBOARD 7.5X6 YLW CONV (MISCELLANEOUS) ×4 IMPLANT
PATCH HEMASHIELD 8X75 (Vascular Products) ×2 IMPLANT
SHUNT CAROTID BYPASS 10 (VASCULAR PRODUCTS) ×2 IMPLANT
SHUNT CAROTID BYPASS 12FRX15.5 (VASCULAR PRODUCTS) IMPLANT
STRIP CLOSURE SKIN 1/2X4 (GAUZE/BANDAGES/DRESSINGS) ×2 IMPLANT
SUT ETHILON 3 0 PS 1 (SUTURE) IMPLANT
SUT PROLENE 6 0 CC (SUTURE) ×12 IMPLANT
SUT SILK 3 0 (SUTURE) ×1
SUT SILK 3-0 18XBRD TIE 12 (SUTURE) ×1 IMPLANT
SUT VIC AB 3-0 SH 27 (SUTURE) ×2
SUT VIC AB 3-0 SH 27X BRD (SUTURE) ×2 IMPLANT
SUT VICRYL 4-0 PS2 18IN ABS (SUTURE) ×2 IMPLANT
SYR CONTROL 10ML LL (SYRINGE) IMPLANT
TOWEL OR 17X24 6PK STRL BLUE (TOWEL DISPOSABLE) ×2 IMPLANT
TOWEL OR 17X26 10 PK STRL BLUE (TOWEL DISPOSABLE) ×2 IMPLANT
WATER STERILE IRR 1000ML POUR (IV SOLUTION) ×2 IMPLANT

## 2013-04-16 NOTE — Progress Notes (Signed)
Pt admitted from PACU - oriented to unit and routines, pain management and safety discussed, pt verbalizes understanding

## 2013-04-16 NOTE — Anesthesia Postprocedure Evaluation (Signed)
  Anesthesia Post-op Note  Patient: Andrea Kirk  Procedure(s) Performed: Procedure(s): Left Carotid Artery Endatarectomy with Resection of Redundant Internal Carotid Artery (Left)  Patient Location: PACU  Anesthesia Type:General  Level of Consciousness: awake and alert   Airway and Oxygen Therapy: Patient Spontanous Breathing  Post-op Pain: mild  Post-op Assessment: Post-op Vital signs reviewed, Patient's Cardiovascular Status Stable, Respiratory Function Stable, Patent Airway, No signs of Nausea or vomiting and Pain level controlled  Post-op Vital Signs: Reviewed and stable  Last Vitals:  Filed Vitals:   04/16/13 1028  BP: 169/104  Pulse: 56  Temp:   Resp: 13    Complications: No apparent anesthesia complications

## 2013-04-16 NOTE — Progress Notes (Signed)
Utilization review completed. Lafonda Patron, RN, BSN. 

## 2013-04-16 NOTE — Anesthesia Procedure Notes (Signed)
Procedure Name: Intubation Date/Time: 04/16/2013 7:44 AM Performed by: Melina Copa, Shaketha Jeon R Pre-anesthesia Checklist: Patient identified, Emergency Drugs available, Suction available, Patient being monitored and Timeout performed Patient Re-evaluated:Patient Re-evaluated prior to inductionOxygen Delivery Method: Circle system utilized Preoxygenation: Pre-oxygenation with 100% oxygen Intubation Type: IV induction Ventilation: Mask ventilation without difficulty Laryngoscope Size: Miller and 2 Grade View: Grade I Tube type: Oral Tube size: 7.5 mm Number of attempts: 1 Airway Equipment and Method: Stylet Placement Confirmation: ETT inserted through vocal cords under direct vision,  positive ETCO2 and breath sounds checked- equal and bilateral Secured at: 22 cm Tube secured with: Tape Dental Injury: Teeth and Oropharynx as per pre-operative assessment

## 2013-04-16 NOTE — Anesthesia Preprocedure Evaluation (Addendum)
Anesthesia Evaluation  Patient identified by MRN, date of birth, ID band Patient awake    Reviewed: Allergy & Precautions, H&P , NPO status , Patient's Chart, lab work & pertinent test results  Airway Mallampati: II TM Distance: >3 FB Neck ROM: Full    Dental  (+) Teeth Intact, Dental Advisory Given   Pulmonary shortness of breath,  breath sounds clear to auscultation        Cardiovascular hypertension, Pt. on medications and Pt. on home beta blockers + CAD, + CABG and + Peripheral Vascular Disease Rhythm:Regular Rate:Normal     Neuro/Psych  Headaches, Anxiety    GI/Hepatic GERD-  ,  Endo/Other  diabetes, Well Controlled, Type 2, Oral Hypoglycemic Agents  Renal/GU      Musculoskeletal   Abdominal   Peds  Hematology   Anesthesia Other Findings   Reproductive/Obstetrics                          Anesthesia Physical Anesthesia Plan  ASA: III  Anesthesia Plan: General   Post-op Pain Management:    Induction: Intravenous  Airway Management Planned: Oral ETT  Additional Equipment: Arterial line  Intra-op Plan:   Post-operative Plan: Extubation in OR  Informed Consent: I have reviewed the patients History and Physical, chart, labs and discussed the procedure including the risks, benefits and alternatives for the proposed anesthesia with the patient or authorized representative who has indicated his/her understanding and acceptance.   Dental advisory given  Plan Discussed with: CRNA, Surgeon and Anesthesiologist  Anesthesia Plan Comments:        Anesthesia Quick Evaluation

## 2013-04-16 NOTE — Op Note (Signed)
     Patient name: Andrea Kirk MRN: [id number] DOB: 08/29/1940 Sex: female  04/16/2013 Pre-operative Diagnosis: Asymptomatic left carotid stenosis Post-operative diagnosis:  Same Surgeon:  Rosetta Posner, M.D. Assistants:  Nurse Procedure:    left carotid Endarterectomy with Dacron patch angioplasty Anesthesia:  General Blood Loss:  See anesthesia record Specimens:  Carotid Plaque to pathology  Indications for surgery:  Severe asymptomatic  Procedure in detail:  The patient was taken to the operating and placed in the supine position. The neck was prepped and draped in the usual sterile fashion. An incision was made anterior to the sternocleidomastoid muscle and continued with electrocautery through the platysma muscle. The muscle was retracted posteriorly and the carotid sheath was opened. The facial vein was ligated with 2-0 silk ties and divided. The common carotid artery was encircled with an umbilical tape and Rummel tourniquet. Dissection was continued onto the carotid bifurcation. The superior thyroid artery was controlled with a 2-0 silk Potts tie. The external carotid organ was encircled with a vessel loop and the internal carotid was encircled with umbilical tape and Rummel tourniquet. The hypoglossal and vagus nerves were identified and preserved.  The patient was given systemic heparinization. After adequate circulation time, the internal,external and common carotid arteries were occluded. The common carotid was opened with an 11 blade and the arteriotomy was continued with Potts scissors onto the internal carotid artery. A 10 shunt was passed up the internal carotid artery, allowed to back bleed, and then passed down the common carotid artery. The shunt was secured with Rummel tourniquet. The endarterectomy was begun on the common carotid artery  plaque was divided proximally with Potts scissors. The endarterectomy was continued onto the carotid bifurcation. The external carotid was  endarterectomized by eversion technique and the internal carotid artery was endarterectomized in an open fashion. Remaining debris was removed from the endarterectomy plane. Due to the redundancy incision was made to resect a portion of the common carotid artery. 6-0 Prolene stay sutures were used and approximately 1 cm of common carotid artery was resected. The common carotid artery was sewn end to end to itself with a running 6-0 Prolene suture. A Dacron patch was brought to the field and sewn as a patch angioplasty. Prior to completing the anastomosis, the shunt was removed and the usual flushing maneuvers were undertaken. The anastomosis was then completed and flow was restored first to the external and then the internal carotid artery. Excellent flow characteristics were noted with hand-held Doppler in the internal and external carotid arteries.  The patient was given protamine to reverse the heparin. Hemostasis was obtained with electrocautery. The wounds were irrigated with saline. The wound was closed by first reapproximating the sternocleidomastoid muscle over the carotid artery with interrupted 3-0 Vicryl sutures. Next, the platysma was closed with a running 3-0 Vicryl suture. The skin was closed with a 4-0 subcuticular Vicryl suture. Benzoin and Steri-Strips were applied to the incision. A sterile dressing was placed over the incision. All sponge and needle counts were correct. The patient was awakened in the operating room, neurologically intact. They were transferred to the PACU in stable condition.  Carotid stenosis at surgery: Greater than 80  Disposition:  To PACU in stable condition,neurologically intact  Relevant Operative Details:  Redundant carotid which was resected  Rosetta Posner, M.D. [facility] Office: [phone] Pager:  [phone]

## 2013-04-16 NOTE — Transfer of Care (Signed)
Immediate Anesthesia Transfer of Care Note  Patient: Andrea Kirk  Procedure(s) Performed: Procedure(s): Left Carotid Artery Endatarectomy with Resection of Redundant Internal Carotid Artery (Left)  Patient Location: PACU  Anesthesia Type:General  Level of Consciousness: awake, alert  and oriented  Airway & Oxygen Therapy: Patient Spontanous Breathing  Post-op Assessment: Report given to PACU RN, Post -op Vital signs reviewed and stable and Patient moving all extremities  Post vital signs: Reviewed and stable  Complications: No apparent anesthesia complications

## 2013-04-16 NOTE — Interval H&P Note (Signed)
History and Physical Interval Note:  04/16/2013 7:24 AM  Andrea Kirk  has presented today for surgery, with the diagnosis of Carotid stenosis  The various methods of treatment have been discussed with the patient and family. After consideration of risks, benefits and other options for treatment, the patient has consented to  Procedure(s): ENDARTERECTOMY CAROTID (Left) as a surgical intervention .  The patient's history has been reviewed, patient examined, no change in status, stable for surgery.  I have reviewed the patient's chart and labs.  Questions were answered to the patient's satisfaction.     Arvilla Meres [name]

## 2013-04-16 NOTE — H&P (View-Only) (Signed)
Established Carotid Patient   History of Present Illness  Andrea Kirk is a 72 y.o. female patient of Dr. Early who has known carotid stenosis. She returns today for follow up.  Patient has not had previous carotid artery intervention.  Patient has Negative history of TIA or stroke symptom.  The patient denies amaurosis fugax or monocular blindness.  The patient  denies facial drooping.  Pt. denies hemiplegia.  The patient denies receptive or expressive aphasia.  Pt. denies extremity weakness.  Pt Diabetic: Yes, 6.5 A1C per pt, good control Pt smoker: non-smoker  Pt meds include: Statin : Yes ASA: Yes Other anticoagulants/antiplatelets: Plavix   Past Medical History  Diagnosis Date  . Hypertension   . Hyperlipidemia   . Anxiety   . Diabetes mellitus   . Angina   . Carotid artery occlusion   . Coronary artery disease     Social History History  Substance Use Topics  . Smoking status: Never Smoker   . Smokeless tobacco: Never Used  . Alcohol Use: No    Family History Family History  Problem Relation Age of Onset  . Heart attack Father   . Heart disease Father   . Hyperlipidemia Father   . Heart disease Brother     Heart dissease before age 60  . Hyperlipidemia Brother   . Heart attack Paternal Grandfather   . Heart disease Paternal Grandfather     Surgical History Past Surgical History  Procedure Laterality Date  . Coronary artery bypass graft  1997  . 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 Bypass graft  Jan. 1997  . Spine surgery  march 2013    Back surgery  . Pr vein bypass graft,aorto-fem-pop  1997  . Colonoscopy with propofol N/A 03/10/2012    Procedure: COLONOSCOPY WITH PROPOFOL;  Surgeon: Martin K Johnson, MD;  Location: [facility];  Service: Endoscopy;  Laterality: N/A;  . Esophagogastroduodenoscopy N/A 03/10/2012    Procedure: ESOPHAGOGASTRODUODENOSCOPY (EGD);  Surgeon: Martin K Johnson, MD;  Location: [facility];  Service:  Endoscopy;  Laterality: N/A;    Allergies  Allergen Reactions  . Codeine Other (See Comments)    Abnormal behavior  . Latex Other (See Comments)    Teras skin  . Morphine And Related     Affects BP and blood sugar.    Current Outpatient Prescriptions  Medication Sig Dispense Refill  . acetaminophen (TYLENOL) 500 MG tablet Take 500 mg by mouth every 6 (six) hours as needed. For pain      . Ascorbic Acid (VITAMIN C) 1000 MG tablet Take 1,000 mg by mouth daily.      . aspirin EC 81 MG tablet Take 81 mg by mouth every morning.      . atorvastatin (LIPITOR) 40 MG tablet Take 40 mg by mouth at bedtime.      . Calcium Carbonate-Vitamin D (CALTRATE 600+D PO) Take 1 tablet by mouth daily.      . carvedilol (COREG) 25 MG tablet Take 1 tablet by mouth  twice a day with food  60 tablet  2  . citalopram (CELEXA) 20 MG tablet Take 20 mg by mouth every morning.       . clopidogrel (PLAVIX) 75 MG tablet Take 75 mg by mouth every morning.       . fish oil-omega-3 fatty acids 1000 MG capsule Take 1 g by mouth daily.      . glipiZIDE (GLUCOTROL XL) 10 MG 24   hr tablet Take 10 mg by mouth daily.      Marland Kitchen HYDROcodone-acetaminophen (NORCO) 10-325 MG per tablet Take 1 tablet by mouth every 4 (four) hours as needed. For pain      . ibuprofen (ADVIL,MOTRIN) 200 MG tablet Take 400 mg by mouth every 6 (six) hours as needed for pain.       Marland Kitchen lisinopril (PRINIVIL,ZESTRIL) 20 MG tablet 1 tablet by mouth daily  90 tablet  3  . loratadine (CLARITIN) 10 MG tablet Take 10 mg by mouth daily as needed for allergies.       Marland Kitchen meclizine (ANTIVERT) 25 MG tablet Take 25 mg by mouth 3 (three) times daily as needed. For dizziness      . metFORMIN (GLUCOPHAGE) 500 MG tablet Take 1,000 mg by mouth 2 (two) times daily with a meal.      . nitroGLYCERIN (NITROSTAT) 0.4 MG SL tablet Place 0.4 mg under the tongue every 5 (five) minutes as needed. Chest pain.      Marland Kitchen oxymetazoline (AFRIN) 0.05 % nasal spray Place 2 sprays into the nose  daily as needed for congestion.      . promethazine (PHENERGAN) 12.5 MG tablet Take 12.5-25 mg by mouth every 8 (eight) hours as needed. nausea      . rOPINIRole (REQUIP) 0.25 MG tablet Take 0.25 mg by mouth 3 (three) times daily.       No current facility-administered medications for this visit.    Review of Systems : See HPI for pertinent positives and negatives.  Physical Examination  Filed Vitals:   04/06/13 1016  BP: 167/86  Pulse: 53  Resp: 14   Filed Weights   04/06/13 1016  Weight: 177 lb (80.287 kg)   Body mass index is 29.45 kg/(m^2).  General: WDWN female in NAD GAIT: normal Eyes: PERRLA Pulmonary:  Non-labored, CTAB, Negative  Rales, Negative rhonchi, & Negative wheezing.  Cardiac: regular Rhythm ,  Negative detected murmur.  VASCULAR EXAM Carotid Bruits Left Right   Positive Negative    Radial pulses are 2+ palpable and equal.                                                                                                                            LE Pulses LEFT RIGHT       POPLITEAL  not palpable   not palpable       POSTERIOR TIBIAL   palpable    palpable        DORSALIS PEDIS      ANTERIOR TIBIAL  palpable   palpable     Gastrointestinal: soft, nontender, BS WNL, no r/g,  negative masses.  Musculoskeletal: Negative muscle atrophy/wasting. M/S 5/5 throughout, Extremities without ischemic changes.  Neurologic: A&O X 3; Appropriate Affect ; SENSATION ;normal;  Speech is normal CN 2-12 intact, Pain and light touch intact in extremities, Motor exam as listed above.   Non-Invasive Vascular Imaging CAROTID DUPLEX 04/06/2013   CEREBROVASCULAR  DUPLEX EVALUATION    INDICATION: Carotid stenosis    PREVIOUS INTERVENTION(S): N/A    DUPLEX EXAM:     RIGHT  LEFT  Peak Systolic Velocities (cm/s) End Diastolic Velocities (cm/s) Plaque LOCATION Peak Systolic Velocities (cm/s) End Diastolic Velocities (cm/s) Plaque  80 14  CCA PROXIMAL 65 16   74 20  HT CCA MID 70 21 HT  89 24 HT CCA DISTAL 94 30 HT  137 24 HT ECA 241 25 CP  244 73 HT ICA PROXIMAL 391 133 CP  108 36  ICA MID 162 38 HT  76 22  ICA DISTAL 92 25     3.30 ICA / CCA Ratio (PSV) 5.60  Antegrade Vertebral Flow Antegrade  138 Brachial Systolic Pressure (mmHg) 158  Triphasic Brachial Artery Waveforms Triphasic    Plaque Morphology:  HM = Homogeneous, HT = Heterogeneous, CP = Calcific Plaque, SP = Smooth Plaque, IP = Irregular Plaque     ADDITIONAL FINDINGS:     IMPRESSION: Right internal carotid artery stenosis present in the 60%-79% range. Left internal carotid artery stenosis present in the 80%-99% range with calcific plaque and vessel tortuousity distal to stenosis. Bilateral external carotid artery stenosis present. Blood pressure gradient present however bilateral antegrade vertebral arteries are present.    Compared to the previous exam:  Stable on the right and increased on the left since previous study on 10/06/2012.      Assessment: Andrea Kirk is a 72 y.o. female who presents with asymptomatic progression of her left internal carotid artery stenosis.  Right internal carotid artery stenosis present in the 60%-79% range. Left internal carotid artery stenosis present in the 80%-99% range with calcific plaque and vessel tortuousity distal to stenosis. Bilateral external carotid artery stenosis present. Blood pressure gradient present however bilateral antegrade vertebral arteries are present. Stable on the right and increased on the left since previous study on 10/06/2012. The  Left ICA stenosis is  Worse from previous exam. Dr. Early spoke with and examined patient, he also spoke with patient's brother by phone who is a retired physician.  Plan:   Schedule for left CEA, patient to decide date.  I discussed in depth with the patient the nature of atherosclerosis, and emphasized the importance of maximal medical management including strict control of blood  pressure, blood glucose, and lipid levels, obtaining regular exercise, and continued cessation of smoking.  The patient is aware that without maximal medical management the underlying atherosclerotic disease process will progress, limiting the benefit of any interventions. The patient was given information about stroke prevention and what symptoms should prompt the patient to seek immediate medical care. Thank you for allowing us to participate in this patient's care.  Sindi Beckworth, RN, MSN, FNP-C [facility] Office: [phone]  Clinic Physician: Early  04/06/2013 10:41 AM  

## 2013-04-17 LAB — CBC
HCT: 32.7 % — ABNORMAL LOW (ref 36.0–46.0)
Hemoglobin: 11.2 g/dL — ABNORMAL LOW (ref 12.0–15.0)
MCH: 33 pg (ref 26.0–34.0)
MCHC: 34.3 g/dL (ref 30.0–36.0)
MCV: 96.5 fL (ref 78.0–100.0)
Platelets: 168 10*3/uL (ref 150–400)
RBC: 3.39 MIL/uL — ABNORMAL LOW (ref 3.87–5.11)
RDW: 13.3 % (ref 11.5–15.5)
WBC: 6.8 10*3/uL (ref 4.0–10.5)

## 2013-04-17 LAB — BASIC METABOLIC PANEL
BUN: 12 mg/dL (ref 6–23)
CO2: 25 mEq/L (ref 19–32)
Calcium: 9.4 mg/dL (ref 8.4–10.5)
Chloride: 99 mEq/L (ref 96–112)
Creatinine, Ser: 0.94 mg/dL (ref 0.50–1.10)
GFR calc Af Amer: 69 mL/min — ABNORMAL LOW (ref >90)
GFR calc non Af Amer: 59 mL/min — ABNORMAL LOW (ref >90)
Glucose, Bld: 277 mg/dL — ABNORMAL HIGH (ref 70–99)
Potassium: 4.4 mEq/L (ref 3.7–5.3)
Sodium: 139 mEq/L (ref 137–147)

## 2013-04-17 MED ORDER — OXYCODONE-ACETAMINOPHEN 5-325 MG PO TABS: 1.0000 | ORAL_TABLET | Freq: Four times a day (QID) | ORAL | Status: DC | PRN

## 2013-04-17 NOTE — Progress Notes (Signed)
Pt discharged per MD order. All discharge instructions reviewed and all questions answered. Surgical site clean, dry, and intact.

## 2013-04-17 NOTE — Discharge Summary (Signed)
[facility] Discharge Summary   Patient ID:  Andrea Kirk MRN: [id number] DOB/AGE: [birth year] 73 y.o.  Admit date: 04/16/2013 Discharge date: 04/17/2013 Date of Surgery: 04/16/2013 Surgeon: Surgeon(s): Rosetta Posner, MD  Admission Diagnosis: Carotid stenosis  Discharge Diagnoses:  Carotid stenosis  Secondary Diagnoses: Past Medical History  Diagnosis Date  . Hyperlipidemia     takes Atorvastatin daily  . Carotid artery occlusion   . Coronary artery disease   . Restless leg     takes Requip daily as needed  . Nausea     takes Phenergan daily as needed  . Muscle spasm     takes Robaxin daily as needed  . Dizziness     takes Meclizine daily as needed  . Seasonal allergies     takes Claritin daily as needed and Afrin as needed  . Diabetes mellitus     takes Metformin and Glipizide daily  . Anxiety     takes Celexa daily  . Hypertension     takes Carvedilol daily  . Pneumonia     hx of-in high school  . Shortness of breath     with exertion  . Headache(784.0)   . Back pain     occasionally  . GERD (gastroesophageal reflux disease)     takes Omeprazole daily as needed  . Urinary urgency   . Cataract     left and immature    Procedure(s): Left Carotid Artery Endatarectomy with Resection of Redundant Internal Carotid Artery  Discharged Condition: good  HPI: 73 y/o female with >90 % carotid stenosis left.  Negative history of TIA or stroke symptom. The patient denies amaurosis fugax or monocular blindness. The patient denies facial drooping.  Pt. denies hemiplegia. The patient denies receptive or expressive aphasia. Pt. denies extremity weakness.   She underwent left carotid endarterectomy.  Post-op day 1 stable Left neck incision healing nicely with no evidence of hematoma or infection. Neurologic exam normal.  She will be discharged home today and f/u in the office in 2 weeks with Dr. Donnetta Hutching.  Patient denies weakness or numbness. He does complain of  some hypersensitivity and left neck incision. Has voided this morning and denies nausea. General alert and oriented x3  Left neck incision healing nicely with no evidence of hematoma or infection  Neurologic exam normal.     Hospital Course:  Andrea Kirk is a 73 y.o. female is S/P Left Procedure(s): Left Carotid Artery Endatarectomy with Resection of Redundant Internal Carotid Artery Extubated: POD # 0 Physical exam: Left neck incision healing nicely with no evidence of hematoma or infection  Neurologic exam normal  Post-op wounds healing well Pt. Ambulating, voiding and taking PO diet without difficulty. Pt pain controlled with PO pain meds. Labs as below Complications:none  Consults:     Significant Diagnostic Studies: CBC Lab Results  Component Value Date   WBC 6.8 04/17/2013   HGB 11.2* 04/17/2013   HCT 32.7* 04/17/2013   MCV 96.5 04/17/2013   PLT 168 04/17/2013    BMET    Component Value Date/Time   NA 139 04/17/2013 0525   K 4.4 04/17/2013 0525   CL 99 04/17/2013 0525   CO2 25 04/17/2013 0525   GLUCOSE 277* 04/17/2013 0525   BUN 12 04/17/2013 0525   CREATININE 0.94 04/17/2013 0525   CALCIUM 9.4 04/17/2013 0525   GFRNONAA 59* 04/17/2013 0525   GFRAA 69* 04/17/2013 0525   COAG Lab Results  Component Value Date   INR 1.01  04/08/2013   INR 1.04 03/15/2011     Disposition:  Discharge to :Home Discharge Orders   Future Orders Complete By Expires   Call MD for:  redness, tenderness, or signs of infection (pain, swelling, bleeding, redness, odor or green/yellow discharge around incision site)  As directed    Call MD for:  severe or increased pain, loss or decreased feeling  in affected limb(s)  As directed    Call MD for:  temperature >100.5  As directed    Discharge instructions  As directed    Driving Restrictions  As directed    Increase activity slowly  As directed    Lifting restrictions  As directed    May shower   As directed    Resume previous diet  As directed         Medication List         acetaminophen 500 MG tablet  Commonly known as:  TYLENOL  Take 500 mg by mouth every 6 (six) hours as needed. For pain     aspirin EC 81 MG tablet  Take 81 mg by mouth every evening.     atorvastatin 40 MG tablet  Commonly known as:  LIPITOR  Take 40 mg by mouth daily.     CALTRATE 600+D PO  Take 1 tablet by mouth daily.     carvedilol 25 MG tablet  Commonly known as:  COREG  Take 1 tablet by mouth  twice a day with food     citalopram 20 MG tablet  Commonly known as:  CELEXA  Take 20 mg by mouth every morning.     clopidogrel 75 MG tablet  Commonly known as:  PLAVIX  Take 75 mg by mouth every morning.     fish oil-omega-3 fatty acids 1000 MG capsule  Take 1 g by mouth daily.     glipiZIDE 10 MG 24 hr tablet  Commonly known as:  GLUCOTROL XL  Take 10 mg by mouth daily.     HYDROcodone-acetaminophen 10-325 MG per tablet  Commonly known as:  NORCO  Take 1 tablet by mouth every 4 (four) hours as needed. For pain     ibuprofen 200 MG tablet  Commonly known as:  ADVIL,MOTRIN  Take 400 mg by mouth every 6 (six) hours as needed for pain.     lisinopril 20 MG tablet  Commonly known as:  PRINIVIL,ZESTRIL  Take 20 mg by mouth at bedtime.     loratadine 10 MG tablet  Commonly known as:  CLARITIN  Take 10 mg by mouth daily as needed for allergies.     meclizine 25 MG tablet  Commonly known as:  ANTIVERT  Take 25 mg by mouth 3 (three) times daily as needed. For dizziness     metFORMIN 500 MG tablet  Commonly known as:  GLUCOPHAGE  Take 1,000 mg by mouth 2 (two) times daily with a meal.     methocarbamol 500 MG tablet  Commonly known as:  ROBAXIN  Take 500 mg by mouth every 8 (eight) hours as needed for muscle spasms.     nitroGLYCERIN 0.4 MG SL tablet  Commonly known as:  NITROSTAT  Place 0.4 mg under the tongue every 5 (five) minutes as needed. Chest pain.     oxyCODONE-acetaminophen 5-325 MG per tablet  Commonly known as:   PERCOCET/ROXICET  Take 1 tablet by mouth every 6 (six) hours as needed for moderate pain or severe pain.     oxymetazoline 0.05 %  nasal spray  Commonly known as:  AFRIN  Place 2 sprays into both nostrils daily as needed for congestion.     promethazine 12.5 MG tablet  Commonly known as:  PHENERGAN  Take 12.5-25 mg by mouth every 8 (eight) hours as needed. nausea     rOPINIRole 0.25 MG tablet  Commonly known as:  REQUIP  Take 0.25 mg by mouth 3 (three) times daily as needed (restless leg syndrome).     vitamin C 1000 MG tablet  Take 1,000 mg by mouth daily.       Verbal and written Discharge instructions given to the patient. Wound care per Discharge AVS     Follow-up Information   Follow up with EARLY, TODD, MD In 2 weeks. (sent message to office)    Specialty:  Vascular Surgery   Contact information:   [address] [place] [address] [phone]       Signed: Ulyses Amor 04/17/2013, 9:32 AM  --- For VQI Registry use --- Instructions: Press F2 to tab through selections.  Delete question if not applicable.   Modified Rankin score at D/C (0-6): Rankin Score=0  IV medication needed for:  1. Hypertension: Yes 2. Hypotension: No  Post-op Complications: No  1. Post-op CVA or TIA: No  If yes: Event classification (right eye, left eye, right cortical, left cortical, verterobasilar, other):   If yes: Timing of event (intra-op, <6 hrs post-op, >=6 hrs post-op, unknown):   2. CN injury: No  If yes: CN  injuried   3. Myocardial infarction: No  If yes: Dx by (EKG or clinical, Troponin):   4.  CHF: No  5.  Dysrhythmia (new): No  6. Wound infection: No  7. Reperfusion symptoms: No  8. Return to OR: No  If yes: return to OR for (bleeding, neurologic, other CEA incision, other):   Discharge medications: Statin use:  Yes ASA use:  Yes Beta blocker use:  Yes ACE-Inhibitor use:  Yes P2Y12 Antagonist use: [ ]  None, [x ] Plavix, [ ]  Plasugrel, [ ]   Ticlopinine, [ ]  Ticagrelor, [ ]  Other, [ ]  No for medical reason, [ ]  Non-compliant, [ ]  Not-indicated Anti-coagulant use:  [ ]  None, [ ]  Warfarin, [ ]  Rivaroxaban, [ ]  Dabigatran, [ ]  Other, [ ]  No for medical reason, [ ]  Non-compliant, [ ]  Not-indicated

## 2013-04-17 NOTE — Progress Notes (Signed)
Patient ID: Andrea Kirk, female   DOB: [birth year], 73 y.o.   MRN: [id number] Vascular Surgery Progress Note  Subjective: 1 day post left carotid endarterectomy with resection of redundant common carotid and re\re anastomosis. Patient denies weakness or numbness. He does complain of some hypersensitivity and left neck incision. Has voided this morning and denies nausea. General alert and oriented x3 Left neck incision healing nicely with no evidence of hematoma or infection Neurologic exam normal  Objective:  Filed Vitals:   04/17/13 0700  BP:   Pulse:   Temp: 98.9 F (37.2 C)  Resp:     1   Labs:  Recent Labs Lab 04/16/13 1330 04/17/13 0525  CREATININE 0.52 0.94    Recent Labs Lab 04/16/13 1330 04/17/13 0525  NA  --  139  K  --  4.4  CL  --  99  CO2  --  25  BUN  --  12  CREATININE 0.52 0.94  GLUCOSE  --  277*  CALCIUM  --  9.4    Recent Labs Lab 04/16/13 1330 04/17/13 0525  WBC 8.2 6.8  HGB 11.5* 11.2*  HCT 33.6* 32.7*  PLT 154 168   No results found for this basename: INR,  in the last 168 hours  I/O last 3 completed shifts: In: 1800 [P.O.:500; I.V.:1300] Out: 1875 [Urine:1775; Blood:100]  Imaging: No results found.  Assessment/Plan:  POD #1  LOS: 1 day  s/p Procedure(s): Left Carotid Artery Endatarectomy with Resection of Redundant Internal Carotid Artery   doing well  one day post left carotid endarterectomy for severe asymptomatic stenosis Coagulated patient and following breakfast plan DC home today  Return to see Dr. early in 2 weeks    Tinnie Gens, MD 04/17/2013 8:35 AM

## 2013-04-19 ENCOUNTER — Encounter (HOSPITAL_COMMUNITY): Payer: Self-pay | Admitting: Vascular Surgery

## 2013-04-19 ENCOUNTER — Telehealth: Payer: Self-pay | Admitting: Vascular Surgery

## 2013-04-19 NOTE — Telephone Encounter (Addendum)
Message copied by Gena Fray on Mon Apr 19, 2013  3:08 PM ------      Message from: [place], [place] K      Created: Mon Apr 19, 2013  8:05 AM      Regarding: Schedule                   ----- Message -----         From: Ulyses Amor, PA-C         Sent: 04/17/2013   8:28 AM           To: [facility] Charge Pool            S/P carotid left f/u with Dr. Donnetta Hutching in 2 weeks ------  04/19/13: lm for pt re appt, dpm

## 2013-04-19 NOTE — Progress Notes (Signed)
   CARE MANAGEMENT NOTE 04/19/2013  Patient:  Andrea Kirk, Andrea Kirk   Account Number:  [id number]  Date Initiated:  04/19/2013  Documentation initiated by:  [facility]  Subjective/Objective Assessment:   Left Carotid Artery Endatarectomy with Resection of Redundant Internal Carotid Artery     Action/Plan:   no dc needs identified   Anticipated DC Date:  04/17/2013   Anticipated DC Plan:  HOME/SELF CARE         Choice offered to / List presented to:             Status of service:  Completed, signed off Medicare Important Message given?   (If response is "NO", the following Medicare IM given date fields will be blank) Date Medicare IM given:   Date Additional Medicare IM given:    Discharge Disposition:  HOME/SELF CARE  Per UR Regulation:    If discussed at Long Length of Stay Meetings, dates discussed:    Comments:

## 2013-05-10 ENCOUNTER — Encounter: Payer: Self-pay | Admitting: Vascular Surgery

## 2013-05-11 ENCOUNTER — Ambulatory Visit (INDEPENDENT_AMBULATORY_CARE_PROVIDER_SITE_OTHER): Payer: Self-pay | Admitting: Vascular Surgery

## 2013-05-11 ENCOUNTER — Encounter: Payer: Self-pay | Admitting: Vascular Surgery

## 2013-05-11 VITALS — BP 146/71 | HR 69 | Resp 18 | Ht 65.0 in | Wt 175.4 lb

## 2013-05-11 DIAGNOSIS — Z48812 Encounter for surgical aftercare following surgery on the circulatory system: Secondary | ICD-10-CM | POA: Insufficient documentation

## 2013-05-11 DIAGNOSIS — I6529 Occlusion and stenosis of unspecified carotid artery: Secondary | ICD-10-CM

## 2013-05-11 HISTORY — DX: Encounter for surgical aftercare following surgery on the circulatory system: Z48.812

## 2013-05-11 NOTE — Progress Notes (Signed)
Here today for followup of left carotid endarterectomy and Dacron patch angioplasty for high-grade asymptomatic left internal carotid artery stenosis. She will Mosca was discharged home on postoperative day #1. She's had no neurologic deficits no difficulty since discharge  On physical exam her neck incision is well-healed with the usual healing ridge. She is grossly intact neurologically. She has no carotid bruits on her right her left neck  Impression and plan stable status post left carotid endarterectomy for high-grade asymptomatic carotid stenosis. She does have a known moderate to severe right internal carotid artery stenosis. We will see her in 6 months for followup of her left endarterectomy and for right carotid stenosis. She'll notify us of any wound problems or neurologic deficits.

## 2013-05-12 NOTE — Addendum Note (Signed)
Addended by: Mena Goes on: 05/12/2013 10:37 AM   Modules accepted: Orders

## 2013-05-25 DIAGNOSIS — I6529 Occlusion and stenosis of unspecified carotid artery: Secondary | ICD-10-CM | POA: Diagnosis not present

## 2013-05-25 DIAGNOSIS — IMO0001 Reserved for inherently not codable concepts without codable children: Secondary | ICD-10-CM | POA: Diagnosis not present

## 2013-05-25 DIAGNOSIS — I10 Essential (primary) hypertension: Secondary | ICD-10-CM | POA: Diagnosis not present

## 2013-07-01 DIAGNOSIS — Z947 Corneal transplant status: Secondary | ICD-10-CM | POA: Diagnosis not present

## 2013-07-01 DIAGNOSIS — Z961 Presence of intraocular lens: Secondary | ICD-10-CM | POA: Diagnosis not present

## 2013-07-01 DIAGNOSIS — H259 Unspecified age-related cataract: Secondary | ICD-10-CM | POA: Diagnosis not present

## 2013-07-01 DIAGNOSIS — H18609 Keratoconus, unspecified, unspecified eye: Secondary | ICD-10-CM | POA: Diagnosis not present

## 2013-07-20 ENCOUNTER — Other Ambulatory Visit: Payer: Self-pay | Admitting: Interventional Cardiology

## 2013-07-27 DIAGNOSIS — R5383 Other fatigue: Secondary | ICD-10-CM | POA: Diagnosis not present

## 2013-07-27 DIAGNOSIS — R5381 Other malaise: Secondary | ICD-10-CM | POA: Diagnosis not present

## 2013-09-08 DIAGNOSIS — M545 Low back pain, unspecified: Secondary | ICD-10-CM | POA: Diagnosis not present

## 2013-09-08 DIAGNOSIS — IMO0002 Reserved for concepts with insufficient information to code with codable children: Secondary | ICD-10-CM | POA: Diagnosis not present

## 2013-09-08 DIAGNOSIS — M47817 Spondylosis without myelopathy or radiculopathy, lumbosacral region: Secondary | ICD-10-CM | POA: Diagnosis not present

## 2013-09-08 DIAGNOSIS — M4310 Spondylolisthesis, site unspecified: Secondary | ICD-10-CM | POA: Diagnosis not present

## 2013-09-28 ENCOUNTER — Other Ambulatory Visit: Payer: Self-pay | Admitting: *Deleted

## 2013-09-28 MED ORDER — NITROGLYCERIN 0.4 MG SL SUBL: 0.4000 mg | SUBLINGUAL_TABLET | SUBLINGUAL | Status: DC | PRN

## 2013-11-15 ENCOUNTER — Encounter: Payer: Self-pay | Admitting: Vascular Surgery

## 2013-11-16 ENCOUNTER — Encounter: Payer: Self-pay | Admitting: Vascular Surgery

## 2013-11-16 ENCOUNTER — Ambulatory Visit (HOSPITAL_COMMUNITY)
Admission: RE | Admit: 2013-11-16 | Discharge: 2013-11-16 | Disposition: A | Discharge status: Home / Self Care | Payer: Medicare Other | Source: Ambulatory Visit | Attending: Vascular Surgery | Admitting: Vascular Surgery

## 2013-11-16 ENCOUNTER — Ambulatory Visit (INDEPENDENT_AMBULATORY_CARE_PROVIDER_SITE_OTHER): Payer: Medicare Other | Admitting: Vascular Surgery

## 2013-11-16 VITALS — BP 152/81 | HR 62 | Resp 16 | Ht 63.5 in | Wt 176.6 lb

## 2013-11-16 DIAGNOSIS — I6523 Occlusion and stenosis of bilateral carotid arteries: Secondary | ICD-10-CM

## 2013-11-16 DIAGNOSIS — I6522 Occlusion and stenosis of left carotid artery: Secondary | ICD-10-CM

## 2013-11-16 DIAGNOSIS — I2510 Atherosclerotic heart disease of native coronary artery without angina pectoris: Secondary | ICD-10-CM | POA: Diagnosis not present

## 2013-11-16 DIAGNOSIS — Z48812 Encounter for surgical aftercare following surgery on the circulatory system: Secondary | ICD-10-CM

## 2013-11-16 DIAGNOSIS — I6529 Occlusion and stenosis of unspecified carotid artery: Secondary | ICD-10-CM

## 2013-11-16 HISTORY — DX: Occlusion and stenosis of unspecified carotid artery: I65.29

## 2013-11-16 NOTE — Addendum Note (Signed)
Addended by: Mena Goes on: 11/16/2013 01:53 PM   Modules accepted: Orders

## 2013-11-16 NOTE — Progress Notes (Signed)
Here today for follow-up of her carotid for severe asymptomatic disease on 04/16/2013. She has done well. She does report mild soreness in this area and persistent numbness in the. Incisional area of her neck as well. This is tolerable to her. She has had no neurologic deficits specifically no amaurosis fugax transient ischemic attack or stroke.  She remained stable from a general medical standpoint  Past Medical History  Diagnosis Date  . Hyperlipidemia     takes Atorvastatin daily  . Carotid artery occlusion   . Coronary artery disease   . Restless leg     takes Requip daily as needed  . Nausea     takes Phenergan daily as needed  . Muscle spasm     takes Robaxin daily as needed  . Dizziness     takes Meclizine daily as needed  . Seasonal allergies     takes Claritin daily as needed and Afrin as needed  . Diabetes mellitus     takes Metformin and Glipizide daily  . Anxiety     takes Celexa daily  . Hypertension     takes Carvedilol daily  . Pneumonia     hx of-in high school  . Shortness of breath     with exertion  . Headache(784.0)   . Back pain     occasionally  . GERD (gastroesophageal reflux disease)     takes Omeprazole daily as needed  . Urinary urgency   . Cataract     left and immature    History  Substance Use Topics  . Smoking status: Never Smoker   . Smokeless tobacco: Never Used  . Alcohol Use: No    Family History  Problem Relation Age of Onset  . Heart attack Father   . Heart disease Father   . Hyperlipidemia Father   . Heart disease Brother     Heart dissease before age 85  . Hyperlipidemia Brother   . Heart attack Paternal Grandfather   . Heart disease Paternal Grandfather     Allergies  Allergen Reactions  . Codeine Other (See Comments)    Abnormal behavior  . Latex Other (See Comments)    Teras skin  . Morphine And Related     Affects BP and blood sugar.    Current outpatient prescriptions: acetaminophen (TYLENOL) 500 MG  tablet, Take 500 mg by mouth every 6 (six) hours as needed. For pain, Disp: , Rfl: ;  Ascorbic Acid (VITAMIN C) 1000 MG tablet, Take 1,000 mg by mouth daily., Disp: , Rfl: ;  aspirin EC 81 MG tablet, Take 81 mg by mouth every evening. , Disp: , Rfl: ;  atorvastatin (LIPITOR) 40 MG tablet, Take 40 mg by mouth daily. , Disp: , Rfl:  Calcium Carbonate-Vitamin D (CALTRATE 600+D PO), Take 1 tablet by mouth daily., Disp: , Rfl: ;  carvedilol (COREG) 25 MG tablet, Take 1 tablet by mouth  twice a day with food, Disp: 180 tablet, Rfl: 1;  citalopram (CELEXA) 20 MG tablet, Take 20 mg by mouth every morning. , Disp: , Rfl: ;  clopidogrel (PLAVIX) 75 MG tablet, Take 75 mg by mouth every morning. , Disp: , Rfl:  fish oil-omega-3 fatty acids 1000 MG capsule, Take 1 g by mouth daily., Disp: , Rfl: ;  glipiZIDE (GLUCOTROL XL) 10 MG 24 hr tablet, Take 10 mg by mouth daily., Disp: , Rfl: ;  HYDROcodone-acetaminophen (NORCO) 10-325 MG per tablet, Take 1 tablet by mouth every 4 (four) hours as needed.  For pain, Disp: , Rfl: ;  ibuprofen (ADVIL,MOTRIN) 200 MG tablet, Take 400 mg by mouth every 6 (six) hours as needed for pain. , Disp: , Rfl:  lisinopril (PRINIVIL,ZESTRIL) 20 MG tablet, Take 20 mg by mouth at bedtime., Disp: , Rfl: ;  loratadine (CLARITIN) 10 MG tablet, Take 10 mg by mouth daily as needed for allergies. , Disp: , Rfl: ;  meclizine (ANTIVERT) 25 MG tablet, Take 25 mg by mouth 3 (three) times daily as needed. For dizziness, Disp: , Rfl: ;  metFORMIN (GLUCOPHAGE) 500 MG tablet, Take 1,000 mg by mouth 2 (two) times daily with a meal., Disp: , Rfl:  methocarbamol (ROBAXIN) 500 MG tablet, Take 500 mg by mouth every 8 (eight) hours as needed for muscle spasms., Disp: , Rfl: ;  nitroGLYCERIN (NITROSTAT) 0.4 MG SL tablet, Place 1 tablet (0.4 mg total) under the tongue every 5 (five) minutes as needed. Chest pain., Disp: 25 tablet, Rfl: 5;  oxymetazoline (AFRIN) 0.05 % nasal spray, Place 2 sprays into both nostrils daily as  needed for congestion. , Disp: , Rfl:  promethazine (PHENERGAN) 12.5 MG tablet, Take 12.5-25 mg by mouth every 8 (eight) hours as needed. nausea, Disp: , Rfl: ;  rOPINIRole (REQUIP) 0.25 MG tablet, Take 0.25 mg by mouth 3 (three) times daily as needed (restless leg syndrome). , Disp: , Rfl: ;  oxyCODONE-acetaminophen (PERCOCET/ROXICET) 5-325 MG per tablet, Take 1 tablet by mouth every 6 (six) hours as needed for moderate pain or severe pain., Disp: 30 tablet, Rfl: 0  BP 152/81 mmHg  Pulse 62  Resp 16  Ht 5' 3.5" (1.613 m)  Wt 176 lb 9.6 oz (80.105 kg)  BMI 30.79 kg/m2  Body mass index is 30.79 kg/(m^2).       On physical exam she has no carotid bruits bilaterally. Her left neck incision is well-healed. 2+ radial pulses bilaterally Heart is regular rate and rhythm Neurologically she is grossly intact  She underwent repeat carotid duplex in our office today and I discussed this with her. This does show no change in her right carotid stenosis in the 40-59% stenosis. 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 Explained this is of no consequence and has not ordered any increased risk for stroke. We will see her again in 6 months with continued duplex follow-up.  She'll notify should she develop any neurologic deficits.

## 2013-12-17 ENCOUNTER — Other Ambulatory Visit: Payer: Self-pay | Admitting: Interventional Cardiology

## 2013-12-28 DIAGNOSIS — K219 Gastro-esophageal reflux disease without esophagitis: Secondary | ICD-10-CM | POA: Diagnosis not present

## 2013-12-28 DIAGNOSIS — F329 Major depressive disorder, single episode, unspecified: Secondary | ICD-10-CM | POA: Diagnosis not present

## 2013-12-28 DIAGNOSIS — I6523 Occlusion and stenosis of bilateral carotid arteries: Secondary | ICD-10-CM | POA: Diagnosis not present

## 2013-12-28 DIAGNOSIS — M6080 Other myositis, unspecified site: Secondary | ICD-10-CM | POA: Diagnosis not present

## 2013-12-28 DIAGNOSIS — E785 Hyperlipidemia, unspecified: Secondary | ICD-10-CM | POA: Diagnosis not present

## 2013-12-28 DIAGNOSIS — I2510 Atherosclerotic heart disease of native coronary artery without angina pectoris: Secondary | ICD-10-CM | POA: Diagnosis not present

## 2013-12-28 DIAGNOSIS — E1165 Type 2 diabetes mellitus with hyperglycemia: Secondary | ICD-10-CM | POA: Diagnosis not present

## 2013-12-28 DIAGNOSIS — I10 Essential (primary) hypertension: Secondary | ICD-10-CM | POA: Diagnosis not present

## 2014-03-15 ENCOUNTER — Encounter: Payer: Self-pay | Admitting: Interventional Cardiology

## 2014-03-15 ENCOUNTER — Ambulatory Visit (INDEPENDENT_AMBULATORY_CARE_PROVIDER_SITE_OTHER): Payer: Medicare Other | Admitting: Interventional Cardiology

## 2014-03-15 VITALS — BP 170/82 | HR 63 | Ht 63.5 in | Wt 174.0 lb

## 2014-03-15 DIAGNOSIS — I2510 Atherosclerotic heart disease of native coronary artery without angina pectoris: Secondary | ICD-10-CM

## 2014-03-15 DIAGNOSIS — E782 Mixed hyperlipidemia: Secondary | ICD-10-CM

## 2014-03-15 DIAGNOSIS — I10 Essential (primary) hypertension: Secondary | ICD-10-CM

## 2014-03-15 DIAGNOSIS — I6522 Occlusion and stenosis of left carotid artery: Secondary | ICD-10-CM

## 2014-03-15 NOTE — Patient Instructions (Signed)
Your physician recommends that you continue on your current medications as directed. Please refer to the Current Medication list given to you today.  Your physician wants you to follow-up in: 1 year with Dr. Varanasi. You will receive a reminder letter in the mail two months in advance. If you don't receive a letter, please call our office to schedule the follow-up appointment.  

## 2014-03-15 NOTE — Progress Notes (Signed)
Patient ID: Andrea Kirk, female   DOB: 1940-07-25, 74 y.o.   MRN: [id number] Patient ID: Andrea Kirk, female   DOB: 09-08-1940, 74 y.o.   MRN: [id number]    [address] [place], [place]  [address] Phone: [phone] Fax:  [phone]  Date:  03/15/2014   ID:  Andrea Kirk, DOB 02/21/1940, MRN [id number]  PCP:  Cammy Copa, MD      History of Present Illness: Andrea Kirk is a 74 y.o. female  who had CABG in 1997. She had been walking but is now limited due to knee pain after a fall. No recent falls.  No syncope.  CAD/ASCVD:  Denies : Chest pain.  Diaphoresis.  Dizziness.  Dyspnea on exertion.  Leg edema.  Nitroglycerin.  Orthopnea.  Palpitations.  Paroxysmal nocturnal dyspnea.  Shortness of breath.  Syncope.    She had left [facility] since the last visit with me.  SHe did quite well with this.  She had problems trying to go back to work third shift at [employer] (24 hours open).       Wt Readings from Last 3 Encounters:  03/15/14 174 lb (78.926 kg)  11/16/13 176 lb 9.6 oz (80.105 kg)  05/11/13 175 lb 6.4 oz (79.561 kg)     Past Medical History  Diagnosis Date  . Hyperlipidemia     takes Atorvastatin daily  . Carotid artery occlusion   . Coronary artery disease   . Restless leg     takes Requip daily as needed  . Nausea     takes Phenergan daily as needed  . Muscle spasm     takes Robaxin daily as needed  . Dizziness     takes Meclizine daily as needed  . Seasonal allergies     takes Claritin daily as needed and Afrin as needed  . Diabetes mellitus     takes Metformin and Glipizide daily  . Anxiety     takes Celexa daily  . Hypertension     takes Carvedilol daily  . Pneumonia     hx of-in high school  . Shortness of breath     with exertion  . Headache(784.0)   . Back pain     occasionally  . GERD (gastroesophageal reflux disease)     takes Omeprazole daily as needed  . Urinary urgency   . Cataract     left and immature     Current Outpatient Prescriptions  Medication Sig Dispense Refill  . acetaminophen (TYLENOL) 500 MG tablet Take 500 mg by mouth every 6 (six) hours as needed. For pain    . Ascorbic Acid (VITAMIN C) 1000 MG tablet Take 1,000 mg by mouth daily.    Marland Kitchen aspirin EC 81 MG tablet Take 81 mg by mouth every evening.     Marland Kitchen atorvastatin (LIPITOR) 40 MG tablet Take 40 mg by mouth daily.     . Calcium Carbonate-Vitamin D (CALTRATE 600+D PO) Take 1 tablet by mouth daily.    . carvedilol (COREG) 25 MG tablet Take 1 tablet by mouth  twice a day with food 180 tablet 1  . citalopram (CELEXA) 20 MG tablet Take 20 mg by mouth every morning.     . clopidogrel (PLAVIX) 75 MG tablet Take 75 mg by mouth every morning.     . fish oil-omega-3 fatty acids 1000 MG capsule Take 1 g by mouth daily.    Marland Kitchen glipiZIDE (GLUCOTROL XL) 10 MG 24 hr tablet Take 10  mg by mouth daily.    Marland Kitchen HYDROcodone-acetaminophen (NORCO) 10-325 MG per tablet Take 1 tablet by mouth every 4 (four) hours as needed. For pain    . ibuprofen (ADVIL,MOTRIN) 200 MG tablet Take 400 mg by mouth every 6 (six) hours as needed for pain.     Marland Kitchen lisinopril (PRINIVIL,ZESTRIL) 20 MG tablet Take 1 tablet by mouth  daily 90 tablet 1  . loratadine (CLARITIN) 10 MG tablet Take 10 mg by mouth daily as needed for allergies.     Marland Kitchen meclizine (ANTIVERT) 25 MG tablet Take 25 mg by mouth 3 (three) times daily as needed. For dizziness    . metFORMIN (GLUCOPHAGE) 500 MG tablet Take 1,000 mg by mouth 2 (two) times daily with a meal.    . methocarbamol (ROBAXIN) 500 MG tablet Take 500 mg by mouth every 8 (eight) hours as needed for muscle spasms.    . nitroGLYCERIN (NITROSTAT) 0.4 MG SL tablet Place 1 tablet (0.4 mg total) under the tongue every 5 (five) minutes as needed. Chest pain. 25 tablet 5  . oxyCODONE-acetaminophen (PERCOCET/ROXICET) 5-325 MG per tablet Take 1 tablet by mouth every 6 (six) hours as needed for moderate pain or severe pain. 30 tablet 0  . oxymetazoline  (AFRIN) 0.05 % nasal spray Place 2 sprays into both nostrils daily as needed for congestion.     . promethazine (PHENERGAN) 12.5 MG tablet Take 12.5-25 mg by mouth every 8 (eight) hours as needed. nausea    . rOPINIRole (REQUIP) 0.25 MG tablet Take 0.25 mg by mouth 3 (three) times daily as needed (restless leg syndrome).      No current facility-administered medications for this visit.    Allergies:    Allergies  Allergen Reactions  . Codeine Other (See Comments)    Abnormal behavior  . Latex Other (See Comments)    Teras skin  . Morphine And Related     Affects BP and blood sugar.    Social History:  The patient  reports that she has never smoked. She has never used smokeless tobacco. She reports that she does not drink alcohol or use illicit drugs.   Family History:  The patient's family history includes Heart attack in her father and paternal grandfather; Heart disease in her brother, father, and paternal grandfather; Hyperlipidemia in her brother and father.   ROS:  Please see the history of present illness.  No nausea, vomiting.  No fevers, chills.  No focal weakness.  No dysuria. As above;   All other systems reviewed and negative.   PHYSICAL EXAM: VS:  BP 170/82 mmHg  Pulse 63  Ht 5' 3.5" (1.613 m)  Wt 174 lb (78.926 kg)  BMI 30.34 kg/m2 Well nourished, well developed, in no acute distress HEENT: normal Neck: no JVD, no carotid bruits Cardiac:  normal S1, S2; RRR;  Lungs:  clear to auscultation bilaterally, no wheezing, rhonchi or rales Abd: soft, nontender, no hepatomegaly Ext: no edema, 2+ radial pulses bilaterally, normal capillary refill bilaterally; 2+ DP bilaterally Skin: warm and dry Neuro:   no focal abnormalities noted Psych: normal affect  EKG:  Normal sinus rhythm, inferior Q waves, no ST segment changes    ASSESSMENT AND PLAN:  Treatment  1. Coronary atherosclerosis of native coronary artery  Decrease Aspirin Tablet, 325 MG, half tablet, Orally, Once  a day Continue Plavix Tablet, 75 MG, 1 tablet, Orally, Once a day  No angina. HDL low. LDL controlled. COntinue to try to exercise. Stress test in  May 2012 showed no ischemia, ejection fraction 64%   No heart issues during the carotid surgery in April 2015.   2. Essential hypertension, benign  Continue Carvedilol Tablet, 25 MG, 1 tablet with food, Orally, Twice a day Increased Lisinopril Tablet, 20 mg, 1 tablet, Orally, Once a day BP target < 130/80. Elevated today. Readings are at home are in the 836 systolic range. If above target, would increase lisinopril to 40 mg daily. Call us with future home readings.   WOuld have her bring BP cuf to doctors office to correlate.  SHe will try with PMD.    3. Pure hypercholesterolemia  Continue Zetia Tablet, 10 MG, 1 tablet, Orally, Once a day Continue atorvastatin Tablet, 40 MG, 1 tablet, Orally, Once a day Continue Fish Oil Capsule, 1000 MG, 2 caps, Orally, Once a day Low HDL. LDL controlled in Jan 2015. TG back in normal range. Followed by Dr. Sheryn Bison. Checked more recently.    4. DM: Nearing requiring insulin.    Preventive Medicine  Adult topics discussed:  Diet: healthy diet.  Exercise: at least 30 minutes of aerobic exercise, 5 days a week. Should be easier as the weather gets warmer.      Signed, Mina Marble, MD, [facility] 03/15/2014 3:43 PM

## 2014-05-18 ENCOUNTER — Encounter: Payer: Self-pay | Admitting: Family

## 2014-05-19 ENCOUNTER — Encounter: Payer: Self-pay | Admitting: Family

## 2014-05-19 ENCOUNTER — Ambulatory Visit (INDEPENDENT_AMBULATORY_CARE_PROVIDER_SITE_OTHER): Payer: Medicare Other | Admitting: Family

## 2014-05-19 ENCOUNTER — Other Ambulatory Visit: Payer: Self-pay | Admitting: *Deleted

## 2014-05-19 ENCOUNTER — Ambulatory Visit (HOSPITAL_COMMUNITY)
Admission: RE | Admit: 2014-05-19 | Discharge: 2014-05-19 | Disposition: A | Discharge status: Home / Self Care | Payer: Medicare Other | Source: Ambulatory Visit | Attending: Family | Admitting: Family

## 2014-05-19 VITALS — BP 125/79 | HR 65 | Resp 16 | Ht 65.0 in | Wt 174.0 lb

## 2014-05-19 DIAGNOSIS — I6523 Occlusion and stenosis of bilateral carotid arteries: Secondary | ICD-10-CM | POA: Diagnosis not present

## 2014-05-19 DIAGNOSIS — I6522 Occlusion and stenosis of left carotid artery: Secondary | ICD-10-CM

## 2014-05-19 LAB — VAS US CAROTID
LEFT CCA MID DIAS: 28 R/I
LEFT CCA MID SYS: 86 cm/s
LEFT ECA DIAS: 31 cm/s
Left CCA dist dias: 20 cm/s
Left CCA dist sys: 71 cm/s
Left CCA prox dias: 23 cm/s
Left CCA prox sys: 74 cm/s
Left ECA sys: 179 cm/s
Left ICA dist dias: 33 cm/s
Left ICA dist sys: 102 cm/s
Left ICA mid dias: 61 cm/s
Left ICA mid sys: 194 cm/s
Left ICA prox dias: 46 cm/s
Left ICA prox sys: 131 cm/s
RIGHT CCA MID DIAS: 15 cm/s
RIGHT CCA MID SYS: 57 cm/s
RIGHT ECA DIAS: 21 cm/s
Right CCA dist dias: 21 cm/s
Right CCA prox dias: 18 cm/s
Right CCA prox sys: 75 cm/s
Right ICA dist dias: 48 cm/s
Right ICA dist sys: 139 cm/s
Right ICA mid dias: 68 cm/s
Right ICA mid sys: 217 cm/s
Right ICA prox dias: 88 cm/s
Right ICA prox sys: 228 cm/s
Right cca dist sys: 76 cm/s
Right eca sys: 117 cm/s

## 2014-05-19 NOTE — Patient Instructions (Signed)
Stroke Prevention Some medical conditions and behaviors are associated with an increased chance of having a stroke. You may prevent a stroke by making healthy choices and managing medical conditions. HOW CAN I REDUCE MY RISK OF HAVING A STROKE?   Stay physically active. Get at least 30 minutes of activity on most or all days.  Do not smoke. It may also be helpful to avoid exposure to secondhand smoke.  Limit alcohol use. Moderate alcohol use is considered to be:  No more than 2 drinks per day for men.  No more than 1 drink per day for nonpregnant women.  Eat healthy foods. This involves:  Eating 5 or more servings of fruits and vegetables a day.  Making dietary changes that address high blood pressure (hypertension), high cholesterol, diabetes, or obesity.  Manage your cholesterol levels.  Making food choices that are high in fiber and low in saturated fat, trans fat, and cholesterol may control cholesterol levels.  Take any prescribed medicines to control cholesterol as directed by your health care provider.  Manage your diabetes.  Controlling your carbohydrate and sugar intake is recommended to manage diabetes.  Take any prescribed medicines to control diabetes as directed by your health care provider.  Control your hypertension.  Making food choices that are low in salt (sodium), saturated fat, trans fat, and cholesterol is recommended to manage hypertension.  Take any prescribed medicines to control hypertension as directed by your health care provider.  Maintain a healthy weight.  Reducing calorie intake and making food choices that are low in sodium, saturated fat, trans fat, and cholesterol are recommended to manage weight.  Stop drug abuse.  Avoid taking birth control pills.  Talk to your health care provider about the risks of taking birth control pills if you are over 35 years old, smoke, get migraines, or have ever had a blood clot.  Get evaluated for sleep  disorders (sleep apnea).  Talk to your health care provider about getting a sleep evaluation if you snore a lot or have excessive sleepiness.  Take medicines only as directed by your health care provider.  For some people, aspirin or blood thinners (anticoagulants) are helpful in reducing the risk of forming abnormal blood clots that can lead to stroke. If you have the irregular heart rhythm of atrial fibrillation, you should be on a blood thinner unless there is a good reason you cannot take them.  Understand all your medicine instructions.  Make sure that other conditions (such as anemia or atherosclerosis) are addressed. SEEK IMMEDIATE MEDICAL CARE IF:   You have sudden weakness or numbness of the face, arm, or leg, especially on one side of the body.  Your face or eyelid droops to one side.  You have sudden confusion.  You have trouble speaking (aphasia) or understanding.  You have sudden trouble seeing in one or both eyes.  You have sudden trouble walking.  You have dizziness.  You have a loss of balance or coordination.  You have a sudden, severe headache with no known cause.  You have new chest pain or an irregular heartbeat. Any of these symptoms may represent a serious problem that is an emergency. Do not wait to see if the symptoms will go away. Get medical help at once. Call your local emergency services (911 in [place] Do not drive yourself to the hospital. Document Released: 02/01/2004 Document Revised: 05/10/2013 Document Reviewed: 06/26/2012 [employer] Patient Information 2015 [employer]. This information is not intended to replace advice given   to you by your health care provider. Make sure you discuss any questions you have with your health care provider.  

## 2014-05-19 NOTE — Progress Notes (Signed)
Established Carotid Patient   History of Present Illness  Hulda Sanroman is a 74 y.o. female patient of Dr. Donnetta Hutching who is s/p left CEA on 04/16/13. She returns today for follow up.  Patient has Negative history of TIA or stroke symptom. The patient denies amaurosis fugax or monocular blindness. The patient denies facial drooping. Pt. denies hemiplegia. The patient denies receptive or expressive aphasia.   She had back surgery in 2013 and has current back issues, is on work restriction due to back issues, she is a Scientist, water quality.  Pt Diabetic: Yes, pt states she may have to go on insulin Pt smoker: non-smoker, but is sometimes exposed to secondhand smoke  Pt meds include: Statin : Yes ASA: Yes Other anticoagulants/antiplatelets: Plavix  Past Medical History  Diagnosis Date  . Hyperlipidemia     takes Atorvastatin daily  . Carotid artery occlusion   . Coronary artery disease   . Restless leg     takes Requip daily as needed  . Nausea     takes Phenergan daily as needed  . Muscle spasm     takes Robaxin daily as needed  . Dizziness     takes Meclizine daily as needed  . Seasonal allergies     takes Claritin daily as needed and Afrin as needed  . Diabetes mellitus     takes Metformin and Glipizide daily  . Anxiety     takes Celexa daily  . Hypertension     takes Carvedilol daily  . Pneumonia     hx of-in high school  . Shortness of breath     with exertion  . Headache(784.0)   . Back pain     occasionally  . GERD (gastroesophageal reflux disease)     takes Omeprazole daily as needed  . Urinary urgency   . Cataract     left and immature    Social History History  Substance Use Topics  . Smoking status: Never Smoker   . Smokeless tobacco: Never Used  . Alcohol Use: No    Family History Family History  Problem Relation Age of Onset  . Heart attack Father   . Heart disease Father 37    Before age of 75  . Hyperlipidemia Father   . Heart disease Brother      Heart dissease before age 70  . Hyperlipidemia Brother   . Heart attack Paternal Grandfather   . Heart disease Paternal Grandfather     Surgical History Past Surgical History  Procedure Laterality Date  . 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 Bypass graft  Jan. 1997  . Spine surgery  march 2013    Back surgery  . Pr vein bypass graft,aorto-fem-pop  1997  . Colonoscopy with propofol N/A 03/10/2012    Procedure: COLONOSCOPY WITH PROPOFOL;  Surgeon: Garlan Fair, MD;  Location: [facility];  Service: Endoscopy;  Laterality: N/A;  . Esophagogastroduodenoscopy N/A 03/10/2012    Procedure: ESOPHAGOGASTRODUODENOSCOPY (EGD);  Surgeon: Garlan Fair, MD;  Location: [facility];  Service: Endoscopy;  Laterality: N/A;  . Tonsillectomy    . Coronary artery bypass graft  1997    x 6  . Corneal transplant Right   . Trigger finger release Left   . Endarterectomy Left 04/16/2013    Procedure: Left Carotid Artery Endatarectomy with Resection of Redundant Internal Carotid Artery;  Surgeon: Rosetta Posner, MD;  Location: [place];  Service: Vascular;  Laterality: Left;  Allergies  Allergen Reactions  . Codeine Other (See Comments)    Abnormal behavior  . Latex Other (See Comments)    Teras skin  . Morphine And Related     Affects BP and blood sugar.    Current Outpatient Prescriptions  Medication Sig Dispense Refill  . acetaminophen (TYLENOL) 500 MG tablet Take 500 mg by mouth every 6 (six) hours as needed. For pain    . Ascorbic Acid (VITAMIN C) 1000 MG tablet Take 1,000 mg by mouth daily.    Marland Kitchen aspirin EC 81 MG tablet Take 81 mg by mouth every evening.     Marland Kitchen atorvastatin (LIPITOR) 40 MG tablet Take 40 mg by mouth daily.     . Calcium Carbonate-Vitamin D (CALTRATE 600+D PO) Take 1 tablet by mouth daily.    . carvedilol (COREG) 25 MG tablet Take 1 tablet by mouth  twice a day with food 180 tablet 1  . citalopram (CELEXA) 20 MG tablet Take 20 mg by mouth every  morning.     . clopidogrel (PLAVIX) 75 MG tablet Take 75 mg by mouth every morning.     . fish oil-omega-3 fatty acids 1000 MG capsule Take 1 g by mouth daily.    Marland Kitchen glipiZIDE (GLUCOTROL XL) 10 MG 24 hr tablet Take 10 mg by mouth daily.    Marland Kitchen HYDROcodone-acetaminophen (NORCO) 10-325 MG per tablet Take 1 tablet by mouth every 4 (four) hours as needed. For pain    . ibuprofen (ADVIL,MOTRIN) 200 MG tablet Take 400 mg by mouth every 6 (six) hours as needed for pain.     Marland Kitchen lisinopril (PRINIVIL,ZESTRIL) 20 MG tablet Take 1 tablet by mouth  daily 90 tablet 1  . loratadine (CLARITIN) 10 MG tablet Take 10 mg by mouth daily as needed for allergies.     Marland Kitchen meclizine (ANTIVERT) 25 MG tablet Take 25 mg by mouth 3 (three) times daily as needed. For dizziness    . metFORMIN (GLUCOPHAGE) 500 MG tablet Take 1,000 mg by mouth 2 (two) times daily with a meal.    . methocarbamol (ROBAXIN) 500 MG tablet Take 500 mg by mouth every 8 (eight) hours as needed for muscle spasms.    . nitroGLYCERIN (NITROSTAT) 0.4 MG SL tablet Place 1 tablet (0.4 mg total) under the tongue every 5 (five) minutes as needed. Chest pain. 25 tablet 5  . promethazine (PHENERGAN) 12.5 MG tablet Take 12.5-25 mg by mouth every 8 (eight) hours as needed. nausea    . rOPINIRole (REQUIP) 0.25 MG tablet Take 0.25 mg by mouth 3 (three) times daily as needed (restless leg syndrome).     Marland Kitchen oxyCODONE-acetaminophen (PERCOCET/ROXICET) 5-325 MG per tablet Take 1 tablet by mouth every 6 (six) hours as needed for moderate pain or severe pain. (Patient not taking: Reported on 05/19/2014) 30 tablet 0  . oxymetazoline (AFRIN) 0.05 % nasal spray Place 2 sprays into both nostrils daily as needed for congestion.      No current facility-administered medications for this visit.    Review of Systems : See HPI for pertinent positives and negatives.  Physical Examination  Filed Vitals:   05/19/14 1355 05/19/14 1358  BP: 130/75 125/79  Pulse: 65 65  Resp:  16   Height:  5\' 5"  (1.651 m)  Weight:  174 lb (78.926 kg)  SpO2:  96%   Body mass index is 28.96 kg/(m^2).  General: WDWN female in NAD GAIT: normal Eyes: PERRLA Pulmonary: Non-labored, CTAB, Negative Rales, Negative rhonchi, &  Negative wheezing.  Cardiac: regular Rhythm, no detected murmur.  VASCULAR EXAM Carotid Bruits Left Right   Positive Negative   Radial pulses are 2+ palpable and equal.      LE Pulses LEFT RIGHT   POPLITEAL not palpable  not palpable   POSTERIOR TIBIAL  palpable   palpable    DORSALIS PEDIS  ANTERIOR TIBIAL palpable  palpable     Gastrointestinal: soft, nontender, BS WNL, no r/g,no palpable  masses.  Musculoskeletal: Negative muscle atrophy/wasting. M/S 5/5 throughout, Extremities without ischemic changes.  Neurologic: A&O X 3; Appropriate Affect, Speech is normal CN 2-12 intact, Pain and light touch intact in extremities, Motor exam as listed above.         Non-Invasive Vascular Imaging CAROTID DUPLEX 05/19/2014   Name: Lexxie CaseMRN: [id number] Date: 5/12/2016DOB: [birth year] Technologist: Ronal Fear RVS,RCS Referring: @REFPROV @  INDICATION: Carotid disease.  PREVIOUS INTERVENTION(S)  IMAGES: Were reviewed by provider  DUPLEX EXAM:  RIGHTLEFT Peak Systolic Velocities (cm/s) End Diastolic Velocities (cm/s) Plaque LOCATION Peak Systolic Velocities (cm/s) End Diastolic Velocities (cm/s) Plaque  75 18  CCA PROXIMAL 74 23   57 15   CCA MID 86 28   76 21  CCA DISTAL 71 20   117 21  ECA 179 31   228 88  ICA PROXIMAL 131 46   217 68  ICA MID 194 61   139 48  ICA DISTAL 102 33    4.0 ICA/CCA Ratio (PSV)   Antegrade Vertebral Flow Antegrade  151 Brachial Systolic Pressure (mmHg) 761  Triphasic  Brachial Artery Waveforms Triphasic     ADDITIONAL FINDINGS: See impression below.  IMPRESSION: Right internal carotid artery velocities suggest a 50-60% stenosis. Patent left carotid endarterectomy site with evidence of hyperplasia present at the distal patch site with velocities suggestive of a 50-69% stenosis.  Compared to the previous exam: Disease progression bilaterally when compared to the previous exam on 11/16/2013.     Assessment: Anyjah Teague is a 74 y.o. female who  is s/p left CEA on 04/16/13. She has no history of stroke or TIA. Today's carotid Duplex suggests 50-60% right ICA stenosis and 50-69% left ICA stenosis. Disease progression bilaterally when compared to the previous exam on 11/16/2013.    Plan: Follow-up in 6 months with Carotid Duplex on a day that Dr. Donnetta Hutching is in the office.    I discussed in depth with the patient the nature of atherosclerosis, and emphasized the importance of maximal medical management including strict control of blood pressure, blood glucose, and lipid levels, obtaining regular exercise, and continued cessation of smoking.  The patient is aware that without maximal medical management the underlying atherosclerotic disease process will progress, limiting the benefit of any interventions. The patient was given information about stroke prevention and what symptoms should prompt the patient to seek immediate medical care. Thank you for allowing [place] to participate in this patient's care.   Clemon Chambers, RN, MSN, FNP-C [facility] Office: [phone]  Clinic Physician: Oneida Alar  05/19/2014  2:19 PM

## 2014-06-17 ENCOUNTER — Emergency Department (HOSPITAL_COMMUNITY)
Admission: EM | Admit: 2014-06-17 | Discharge: 2014-06-17 | Disposition: A | Discharge status: Home / Self Care | Payer: Medicare Other | Attending: Emergency Medicine | Admitting: Emergency Medicine

## 2014-06-17 ENCOUNTER — Encounter (HOSPITAL_COMMUNITY): Payer: Self-pay | Admitting: Neurology

## 2014-06-17 DIAGNOSIS — E785 Hyperlipidemia, unspecified: Secondary | ICD-10-CM | POA: Diagnosis not present

## 2014-06-17 DIAGNOSIS — Z91040 Latex allergy status: Secondary | ICD-10-CM | POA: Diagnosis not present

## 2014-06-17 DIAGNOSIS — I10 Essential (primary) hypertension: Secondary | ICD-10-CM | POA: Insufficient documentation

## 2014-06-17 DIAGNOSIS — Z8701 Personal history of pneumonia (recurrent): Secondary | ICD-10-CM | POA: Diagnosis not present

## 2014-06-17 DIAGNOSIS — F419 Anxiety disorder, unspecified: Secondary | ICD-10-CM | POA: Diagnosis not present

## 2014-06-17 DIAGNOSIS — R531 Weakness: Secondary | ICD-10-CM | POA: Diagnosis present

## 2014-06-17 DIAGNOSIS — I2510 Atherosclerotic heart disease of native coronary artery without angina pectoris: Secondary | ICD-10-CM | POA: Diagnosis not present

## 2014-06-17 DIAGNOSIS — G2581 Restless legs syndrome: Secondary | ICD-10-CM | POA: Diagnosis not present

## 2014-06-17 DIAGNOSIS — E119 Type 2 diabetes mellitus without complications: Secondary | ICD-10-CM | POA: Insufficient documentation

## 2014-06-17 DIAGNOSIS — Z79899 Other long term (current) drug therapy: Secondary | ICD-10-CM | POA: Insufficient documentation

## 2014-06-17 DIAGNOSIS — Z951 Presence of aortocoronary bypass graft: Secondary | ICD-10-CM | POA: Insufficient documentation

## 2014-06-17 DIAGNOSIS — K219 Gastro-esophageal reflux disease without esophagitis: Secondary | ICD-10-CM | POA: Insufficient documentation

## 2014-06-17 DIAGNOSIS — Z7982 Long term (current) use of aspirin: Secondary | ICD-10-CM | POA: Diagnosis not present

## 2014-06-17 LAB — URINALYSIS, ROUTINE W REFLEX MICROSCOPIC
Bilirubin Urine: NEGATIVE
Glucose, UA: NEGATIVE mg/dL
Hgb urine dipstick: NEGATIVE
Ketones, ur: NEGATIVE mg/dL
Leukocytes, UA: NEGATIVE
Nitrite: NEGATIVE
Protein, ur: NEGATIVE mg/dL
Specific Gravity, Urine: 1.025 (ref 1.005–1.030)
Urobilinogen, UA: 1 mg/dL (ref 0.0–1.0)
pH: 6 (ref 5.0–8.0)

## 2014-06-17 LAB — BASIC METABOLIC PANEL
Anion gap: 10 (ref 5–15)
BUN: 18 mg/dL (ref 6–20)
CO2: 23 mmol/L (ref 22–32)
Calcium: 9.7 mg/dL (ref 8.9–10.3)
Chloride: 106 mmol/L (ref 101–111)
Creatinine, Ser: 0.72 mg/dL (ref 0.44–1.00)
GFR calc Af Amer: >60 mL/min (ref >60)
GFR calc non Af Amer: >60 mL/min (ref >60)
Glucose, Bld: 140 mg/dL — ABNORMAL HIGH (ref 65–99)
Potassium: 4.4 mmol/L (ref 3.5–5.1)
Sodium: 139 mmol/L (ref 135–145)

## 2014-06-17 LAB — HEPATIC FUNCTION PANEL
ALT: 35 U/L (ref 14–54)
AST: 37 U/L (ref 15–41)
Albumin: 3.8 g/dL (ref 3.5–5.0)
Alkaline Phosphatase: 57 U/L (ref 38–126)
Bilirubin, Direct: 0.1 mg/dL (ref 0.1–0.5)
Indirect Bilirubin: 0.3 mg/dL (ref 0.3–0.9)
Total Bilirubin: 0.4 mg/dL (ref 0.3–1.2)
Total Protein: 6.8 g/dL (ref 6.5–8.1)

## 2014-06-17 LAB — I-STAT TROPONIN, ED: Troponin i, poc: 0 ng/mL (ref 0.00–0.08)

## 2014-06-17 LAB — CBC WITH DIFFERENTIAL/PLATELET
Basophils Absolute: 0 10*3/uL (ref 0.0–0.1)
Basophils Relative: 1 % (ref 0–1)
Eosinophils Absolute: 0.1 10*3/uL (ref 0.0–0.7)
Eosinophils Relative: 2 % (ref 0–5)
HCT: 36.4 % (ref 36.0–46.0)
Hemoglobin: 12.4 g/dL (ref 12.0–15.0)
Lymphocytes Relative: 36 % (ref 12–46)
Lymphs Abs: 2.3 10*3/uL (ref 0.7–4.0)
MCH: 32.7 pg (ref 26.0–34.0)
MCHC: 34.1 g/dL (ref 30.0–36.0)
MCV: 96 fL (ref 78.0–100.0)
Monocytes Absolute: 0.5 10*3/uL (ref 0.1–1.0)
Monocytes Relative: 8 % (ref 3–12)
Neutro Abs: 3.4 10*3/uL (ref 1.7–7.7)
Neutrophils Relative %: 53 % (ref 43–77)
Platelets: 172 10*3/uL (ref 150–400)
RBC: 3.79 MIL/uL — ABNORMAL LOW (ref 3.87–5.11)
RDW: 12.8 % (ref 11.5–15.5)
WBC: 6.3 10*3/uL (ref 4.0–10.5)

## 2014-06-17 MED ORDER — SODIUM CHLORIDE 0.9 % IV BOLUS (SEPSIS)
500.0000 mL | Freq: Once | INTRAVENOUS | Status: AC
  Administered 2014-06-17: 500 mL via INTRAVENOUS

## 2014-06-17 MED ORDER — ACETAMINOPHEN 325 MG PO TABS
650.0000 mg | ORAL_TABLET | Freq: Once | ORAL | Status: AC
  Administered 2014-06-17: 650 mg via ORAL
  Filled 2014-06-17: qty 2

## 2014-06-17 NOTE — ED Notes (Signed)
Per EMS- Pt is coming from home c/o generalized weakness since this morning. Yesterday had n/v/d. C/o right chronic shoulder pain. BP 170/98, HR 85 SR, 16 RR, 99% RA, CBG 123. Pt is a x 4

## 2014-06-17 NOTE — ED Notes (Signed)
Pt ambulated to restroom with this RN.   

## 2014-06-17 NOTE — Discharge Instructions (Signed)
The cause of your symptoms was not identified today.  Please get rechecked immediately if you develop new or concerning symptoms.    Weakness Weakness is a lack of strength. It may be felt all over the body (generalized) or in one specific part of the body (focal). Some causes of weakness can be serious. You may need further medical evaluation, especially if you are elderly or you have a history of immunosuppression (such as chemotherapy or HIV), kidney disease, heart disease, or diabetes. CAUSES  Weakness can be caused by many different things, including:  Infection.  Physical exhaustion.  Internal bleeding or other blood loss that results in a lack of red blood cells (anemia).  Dehydration. This cause is more common in elderly people.  Side effects or electrolyte abnormalities from medicines, such as pain medicines or sedatives.  Emotional distress, anxiety, or depression.  Circulation problems, especially severe peripheral arterial disease.  Heart disease, such as rapid atrial fibrillation, bradycardia, or heart failure.  Nervous system disorders, such as Guillain-Barr syndrome, multiple sclerosis, or stroke. DIAGNOSIS  To find the cause of your weakness, your caregiver will take your history and perform a physical exam. Lab tests or X-rays may also be ordered, if needed. TREATMENT  Treatment of weakness depends on the cause of your symptoms and can vary greatly. HOME CARE INSTRUCTIONS   Rest as needed.  Eat a well-balanced diet.  Try to get some exercise every day.  Only take over-the-counter or prescription medicines as directed by your caregiver. SEEK MEDICAL CARE IF:   Your weakness seems to be getting worse or spreads to other parts of your body.  You develop new aches or pains. SEEK IMMEDIATE MEDICAL CARE IF:   You cannot perform your normal daily activities, such as getting dressed and feeding yourself.  You cannot walk up and down stairs, or you feel  exhausted when you do so.  You have shortness of breath or chest pain.  You have difficulty moving parts of your body.  You have weakness in only one area of the body or on only one side of the body.  You have a fever.  You have trouble speaking or swallowing.  You cannot control your bladder or bowel movements.  You have black or bloody vomit or stools. MAKE SURE YOU:  Understand these instructions.  Will watch your condition.  Will get help right away if you are not doing well or get worse. Document Released: 12/24/2004 Document Revised: 06/25/2011 Document Reviewed: 02/22/2011 [facility] Patient Information 2015 [place], [place]. This information is not intended to replace advice given to you by your health care provider. Make sure you discuss any questions you have with your health care provider.

## 2014-06-17 NOTE — ED Provider Notes (Signed)
CSN: [id number]     Arrival date & time 06/17/14  1832 History   First MD Initiated Contact with Patient 06/17/14 1925     Chief Complaint  Patient presents with  . Weakness    Patient is a 74 y.o. female presenting with weakness. The history is provided by the patient. No language interpreter was used.  Weakness   Ms. Lemanski presents for evaluation of V/D/near syncope.  She had diarrhea yesterday and vomiting last night.  Today she developed nausea and diaphoresis around 1pm.  She had a second episode around 4pm.  She did not check her blood sugar. She went to eat a sandwich and felt like she was going to pass out. She denies any fevers, headache, chest pain, SOB, abdominal pain, leg swelling.  She currently feels generalized weakness.  No V/D today.  No hematemesis, melena, hematochezia.  Sxs are moderate, constant.    Past Medical History  Diagnosis Date  . Hyperlipidemia     takes Atorvastatin daily  . Carotid artery occlusion   . Coronary artery disease   . Restless leg     takes Requip daily as needed  . Nausea     takes Phenergan daily as needed  . Muscle spasm     takes Robaxin daily as needed  . Dizziness     takes Meclizine daily as needed  . Seasonal allergies     takes Claritin daily as needed and Afrin as needed  . Diabetes mellitus     takes Metformin and Glipizide daily  . Anxiety     takes Celexa daily  . Hypertension     takes Carvedilol daily  . Pneumonia     hx of-in high school  . Shortness of breath     with exertion  . Headache(784.0)   . Back pain     occasionally  . GERD (gastroesophageal reflux disease)     takes Omeprazole daily as needed  . Urinary urgency   . Cataract     left and immature   Past Surgical History  Procedure Laterality Date  . 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 Bypass graft  Jan. 1997  . Spine surgery  march 2013    Back surgery  . Pr vein bypass graft,aorto-fem-pop  1997  . Colonoscopy with  propofol N/A 03/10/2012    Procedure: COLONOSCOPY WITH PROPOFOL;  Surgeon: Garlan Fair, MD;  Location: [facility];  Service: Endoscopy;  Laterality: N/A;  . Esophagogastroduodenoscopy N/A 03/10/2012    Procedure: ESOPHAGOGASTRODUODENOSCOPY (EGD);  Surgeon: Garlan Fair, MD;  Location: [facility];  Service: Endoscopy;  Laterality: N/A;  . Tonsillectomy    . Coronary artery bypass graft  1997    x 6  . Corneal transplant Right   . Trigger finger release Left   . Endarterectomy Left 04/16/2013    Procedure: Left Carotid Artery Endatarectomy with Resection of Redundant Internal Carotid Artery;  Surgeon: Rosetta Posner, MD;  Location: [facility];  Service: Vascular;  Laterality: Left;   Family History  Problem Relation Age of Onset  . Heart attack Father   . Heart disease Father 40    Before age of 1  . Hyperlipidemia Father   . Heart disease Brother     Heart dissease before age 57  . Hyperlipidemia Brother   . Heart attack Paternal Grandfather   . Heart disease Paternal Grandfather    History  Substance Use Topics  .  Smoking status: Never Smoker   . Smokeless tobacco: Never Used  . Alcohol Use: No   OB History    No data available     Review of Systems  Neurological: Positive for weakness.  All other systems reviewed and are negative.     Allergies  Codeine; Latex; and Morphine and related  Home Medications   Prior to Admission medications   Medication Sig Start Date End Date Taking? Authorizing Provider  acetaminophen (TYLENOL) 500 MG tablet Take 500 mg by mouth every 6 (six) hours as needed. For pain   Yes Historical Provider, MD  Ascorbic Acid (VITAMIN C) 1000 MG tablet Take 1,000 mg by mouth daily.   Yes Historical Provider, MD  aspirin EC 81 MG tablet Take 81 mg by mouth every evening.    Yes Historical Provider, MD  atorvastatin (LIPITOR) 40 MG tablet Take 40 mg by mouth daily.    Yes Historical Provider, MD  Calcium Carbonate-Vitamin D (CALTRATE 600+D PO)  Take 1 tablet by mouth daily.   Yes Historical Provider, MD  carvedilol (COREG) 25 MG tablet Take 1 tablet by mouth  twice a day with food 12/17/13  Yes Jettie Booze, MD  citalopram (CELEXA) 20 MG tablet Take 20 mg by mouth every morning.    Yes Historical Provider, MD  clopidogrel (PLAVIX) 75 MG tablet Take 75 mg by mouth every morning.    Yes Historical Provider, MD  diphenhydrAMINE (BENADRYL) 25 MG tablet Take 25 mg by mouth every 6 (six) hours as needed for itching or allergies.   Yes Historical Provider, MD  fish oil-omega-3 fatty acids 1000 MG capsule Take 1 g by mouth daily.   Yes Historical Provider, MD  glipiZIDE (GLUCOTROL XL) 10 MG 24 hr tablet Take 10 mg by mouth daily.   Yes Historical Provider, MD  HYDROcodone-acetaminophen (NORCO) 10-325 MG per tablet Take 1 tablet by mouth every 4 (four) hours as needed. For pain   Yes Historical Provider, MD  ibuprofen (ADVIL,MOTRIN) 200 MG tablet Take 400 mg by mouth every 6 (six) hours as needed for pain.    Yes Historical Provider, MD  lisinopril (PRINIVIL,ZESTRIL) 20 MG tablet Take 1 tablet by mouth  daily Patient taking differently: Take 1 tablet by mouth at bedtime 12/17/13  Yes Jettie Booze, MD  meclizine (ANTIVERT) 25 MG tablet Take 25 mg by mouth 3 (three) times daily as needed. For dizziness   Yes Historical Provider, MD  metFORMIN (GLUCOPHAGE) 500 MG tablet Take 1,000 mg by mouth 2 (two) times daily with a meal.   Yes Historical Provider, MD  methocarbamol (ROBAXIN) 500 MG tablet Take 500 mg by mouth every 8 (eight) hours as needed for muscle spasms.   Yes Historical Provider, MD  nitroGLYCERIN (NITROSTAT) 0.4 MG SL tablet Place 1 tablet (0.4 mg total) under the tongue every 5 (five) minutes as needed. Chest pain. 09/28/13  Yes Jettie Booze, MD  promethazine (PHENERGAN) 12.5 MG tablet Take 12.5-25 mg by mouth every 8 (eight) hours as needed. nausea   Yes Historical Provider, MD  rOPINIRole (REQUIP) 0.25 MG tablet Take  0.25 mg by mouth 3 (three) times daily as needed (restless leg syndrome).    Yes Historical Provider, MD  oxyCODONE-acetaminophen (PERCOCET/ROXICET) 5-325 MG per tablet Take 1 tablet by mouth every 6 (six) hours as needed for moderate pain or severe pain. Patient not taking: Reported on 05/19/2014 04/17/13   Ulyses Amor, PA-C   BP 138/70 mmHg  Pulse 67  Temp(Src) 97.7 F (36.5 C) (Oral)  Resp 17  SpO2 99% Physical Exam  Constitutional: She is oriented to person, place, and time. She appears well-developed and well-nourished.  HENT:  Head: Normocephalic and atraumatic.  Cardiovascular: Normal rate and regular rhythm.   No murmur heard. Pulmonary/Chest: Effort normal and breath sounds normal. No respiratory distress.  Abdominal: Soft. There is no tenderness. There is no rebound and no guarding.  Musculoskeletal: She exhibits no edema or tenderness.  Neurological: She is alert and oriented to person, place, and time.  Skin: Skin is warm and dry.  Psychiatric: She has a normal mood and affect. Her behavior is normal.  Nursing note and vitals reviewed.   ED Course  Procedures (including critical care time) Labs Review Labs Reviewed  CBC WITH DIFFERENTIAL/PLATELET - Abnormal; Notable for the following:    RBC 3.79 (*)    All other components within normal limits  BASIC METABOLIC PANEL - Abnormal; Notable for the following:    Glucose, Bld 140 (*)    All other components within normal limits  URINALYSIS, ROUTINE W REFLEX MICROSCOPIC (NOT AT [facility])  HEPATIC FUNCTION PANEL  I-STAT TROPOININ, ED    Imaging Review No results found.   EKG Interpretation   Date/Time:  Friday June 17 2014 18:34:39 EDT Ventricular Rate:  77 PR Interval:  162 QRS Duration: 98 QT Interval:  452 QTC Calculation: 512 R Axis:   41 Text Interpretation:  Sinus rhythm Ventricular premature complex  Borderline repolarization abnormality Prolonged QT interval Baseline  wander in lead(s) I II aVR V3  Confirmed by Hazle Coca [phone]) on 06/17/2014  7:01:03 PM      MDM   Final diagnoses:  Asthenia    Patient here for evaluation of generalized weakness and near syncope, had a recent illness with vomiting and diarrhea. Patient is nontoxic appearing on examination. History and presentation is not consistent with ACS, PE, CVA. Discussed with patient and home care for weakness, near syncope. Discussed PCP follow-up and return precautions.    Quintella Reichert, MD 06/18/14 [id number]

## 2014-07-14 ENCOUNTER — Other Ambulatory Visit: Payer: Self-pay | Admitting: Family Medicine

## 2014-07-15 ENCOUNTER — Other Ambulatory Visit: Payer: Self-pay | Admitting: Family Medicine

## 2014-07-15 DIAGNOSIS — R109 Unspecified abdominal pain: Secondary | ICD-10-CM

## 2014-07-21 ENCOUNTER — Ambulatory Visit
Admission: RE | Admit: 2014-07-21 | Discharge: 2014-07-21 | Disposition: A | Discharge status: Home / Self Care | Payer: Medicare Other | Source: Ambulatory Visit | Attending: Family Medicine | Admitting: Family Medicine

## 2014-07-21 DIAGNOSIS — R109 Unspecified abdominal pain: Secondary | ICD-10-CM

## 2014-11-14 ENCOUNTER — Encounter: Payer: Self-pay | Admitting: Family

## 2014-11-16 ENCOUNTER — Other Ambulatory Visit: Payer: Self-pay | Admitting: Interventional Cardiology

## 2014-11-17 ENCOUNTER — Ambulatory Visit: Payer: Medicare Other | Admitting: Family

## 2014-11-17 ENCOUNTER — Encounter (HOSPITAL_COMMUNITY): Payer: Medicare Other

## 2015-01-25 ENCOUNTER — Encounter: Payer: Self-pay | Admitting: Family

## 2015-02-02 ENCOUNTER — Encounter (HOSPITAL_COMMUNITY): Payer: Medicare Other

## 2015-02-02 ENCOUNTER — Ambulatory Visit: Payer: Medicare Other | Admitting: Family

## 2015-02-18 ENCOUNTER — Emergency Department (HOSPITAL_COMMUNITY)
Admission: EM | Admit: 2015-02-18 | Discharge: 2015-02-18 | Disposition: A | Discharge status: Home / Self Care | Payer: Medicare Other | Attending: Emergency Medicine | Admitting: Emergency Medicine

## 2015-02-18 ENCOUNTER — Emergency Department (HOSPITAL_COMMUNITY): Payer: Medicare Other

## 2015-02-18 ENCOUNTER — Encounter (HOSPITAL_COMMUNITY): Payer: Self-pay

## 2015-02-18 DIAGNOSIS — E785 Hyperlipidemia, unspecified: Secondary | ICD-10-CM | POA: Insufficient documentation

## 2015-02-18 DIAGNOSIS — Z951 Presence of aortocoronary bypass graft: Secondary | ICD-10-CM | POA: Insufficient documentation

## 2015-02-18 DIAGNOSIS — Z8701 Personal history of pneumonia (recurrent): Secondary | ICD-10-CM | POA: Insufficient documentation

## 2015-02-18 DIAGNOSIS — I2510 Atherosclerotic heart disease of native coronary artery without angina pectoris: Secondary | ICD-10-CM | POA: Insufficient documentation

## 2015-02-18 DIAGNOSIS — E119 Type 2 diabetes mellitus without complications: Secondary | ICD-10-CM | POA: Insufficient documentation

## 2015-02-18 DIAGNOSIS — J0101 Acute recurrent maxillary sinusitis: Secondary | ICD-10-CM | POA: Insufficient documentation

## 2015-02-18 DIAGNOSIS — K219 Gastro-esophageal reflux disease without esophagitis: Secondary | ICD-10-CM | POA: Insufficient documentation

## 2015-02-18 DIAGNOSIS — R0981 Nasal congestion: Secondary | ICD-10-CM | POA: Diagnosis present

## 2015-02-18 DIAGNOSIS — Z91040 Latex allergy status: Secondary | ICD-10-CM | POA: Diagnosis not present

## 2015-02-18 DIAGNOSIS — G2581 Restless legs syndrome: Secondary | ICD-10-CM | POA: Insufficient documentation

## 2015-02-18 DIAGNOSIS — Z79899 Other long term (current) drug therapy: Secondary | ICD-10-CM | POA: Insufficient documentation

## 2015-02-18 DIAGNOSIS — F419 Anxiety disorder, unspecified: Secondary | ICD-10-CM | POA: Diagnosis not present

## 2015-02-18 DIAGNOSIS — Z7982 Long term (current) use of aspirin: Secondary | ICD-10-CM | POA: Diagnosis not present

## 2015-02-18 DIAGNOSIS — Z7984 Long term (current) use of oral hypoglycemic drugs: Secondary | ICD-10-CM | POA: Diagnosis not present

## 2015-02-18 DIAGNOSIS — R0602 Shortness of breath: Secondary | ICD-10-CM

## 2015-02-18 DIAGNOSIS — I10 Essential (primary) hypertension: Secondary | ICD-10-CM | POA: Insufficient documentation

## 2015-02-18 DIAGNOSIS — Z7902 Long term (current) use of antithrombotics/antiplatelets: Secondary | ICD-10-CM | POA: Insufficient documentation

## 2015-02-18 LAB — CBC WITH DIFFERENTIAL/PLATELET
Basophils Absolute: 0.1 10*3/uL (ref 0.0–0.1)
Basophils Relative: 1 %
Eosinophils Absolute: 0.1 10*3/uL (ref 0.0–0.7)
Eosinophils Relative: 2 %
HCT: 34.7 % — ABNORMAL LOW (ref 36.0–46.0)
Hemoglobin: 12.1 g/dL (ref 12.0–15.0)
Lymphocytes Relative: 37 %
Lymphs Abs: 3 10*3/uL (ref 0.7–4.0)
MCH: 32.9 pg (ref 26.0–34.0)
MCHC: 34.9 g/dL (ref 30.0–36.0)
MCV: 94.3 fL (ref 78.0–100.0)
Monocytes Absolute: 0.8 10*3/uL (ref 0.1–1.0)
Monocytes Relative: 10 %
Neutro Abs: 4.2 10*3/uL (ref 1.7–7.7)
Neutrophils Relative %: 50 %
Platelets: 226 10*3/uL (ref 150–400)
RBC: 3.68 MIL/uL — ABNORMAL LOW (ref 3.87–5.11)
RDW: 12.7 % (ref 11.5–15.5)
WBC: 8.2 10*3/uL (ref 4.0–10.5)

## 2015-02-18 LAB — URINE MICROSCOPIC-ADD ON
Bacteria, UA: NONE SEEN
RBC / HPF: NONE SEEN RBC/hpf (ref 0–5)

## 2015-02-18 LAB — BASIC METABOLIC PANEL
Anion gap: 12 (ref 5–15)
BUN: 17 mg/dL (ref 6–20)
CO2: 23 mmol/L (ref 22–32)
Calcium: 9.8 mg/dL (ref 8.9–10.3)
Chloride: 102 mmol/L (ref 101–111)
Creatinine, Ser: 0.68 mg/dL (ref 0.44–1.00)
GFR calc Af Amer: >60 mL/min (ref >60)
GFR calc non Af Amer: >60 mL/min (ref >60)
Glucose, Bld: 300 mg/dL — ABNORMAL HIGH (ref 65–99)
Potassium: 3.7 mmol/L (ref 3.5–5.1)
Sodium: 137 mmol/L (ref 135–145)

## 2015-02-18 LAB — URINALYSIS, ROUTINE W REFLEX MICROSCOPIC
Bilirubin Urine: NEGATIVE
Glucose, UA: 500 mg/dL — AB
Hgb urine dipstick: NEGATIVE
Ketones, ur: NEGATIVE mg/dL
Leukocytes, UA: NEGATIVE
Nitrite: NEGATIVE
Protein, ur: 30 mg/dL — AB
Specific Gravity, Urine: 1.031 — ABNORMAL HIGH (ref 1.005–1.030)
pH: 8.5 — ABNORMAL HIGH (ref 5.0–8.0)

## 2015-02-18 MED ORDER — IOHEXOL 300 MG/ML  SOLN
100.0000 mL | Freq: Once | INTRAMUSCULAR | Status: AC | PRN
  Administered 2015-02-18: 500 mL via INTRAVENOUS

## 2015-02-18 MED ORDER — AMOXICILLIN-POT CLAVULANATE 875-125 MG PO TABS: 1.0000 | ORAL_TABLET | Freq: Two times a day (BID) | ORAL | Status: DC

## 2015-02-18 MED ORDER — SODIUM CHLORIDE 0.9 % IV SOLN
INTRAVENOUS | Status: DC
  Administered 2015-02-18: 20 mL/h via INTRAVENOUS

## 2015-02-18 NOTE — ED Notes (Signed)
Bed: WA10 Expected date:  Expected time:  Means of arrival:  Comments: EMS-SOB 

## 2015-02-18 NOTE — Discharge Instructions (Signed)

## 2015-02-18 NOTE — ED Provider Notes (Addendum)
CSN: [id number]     Arrival date & time 02/18/15  1437 History   First MD Initiated Contact with Patient 02/18/15 1520     Chief Complaint  Patient presents with  . Nasal Congestion     (Consider location/radiation/quality/duration/timing/severity/associated sxs/prior Treatment) HPI Comments: Pt here w/ congestion x 3 days and left maxillary sinus pain Slight headache but no visual changes Denies urinary issues No anginal or CHF sx  No fever but some non -productive cough--has been using otc w/o relief Called ems but she didn't feel wel No tx by ems pta  The history is provided by the patient and a relative.    Past Medical History  Diagnosis Date  . Hyperlipidemia     takes Atorvastatin daily  . Carotid artery occlusion   . Coronary artery disease   . Restless leg     takes Requip daily as needed  . Nausea     takes Phenergan daily as needed  . Muscle spasm     takes Robaxin daily as needed  . Dizziness     takes Meclizine daily as needed  . Seasonal allergies     takes Claritin daily as needed and Afrin as needed  . Diabetes mellitus     takes Metformin and Glipizide daily  . Anxiety     takes Celexa daily  . Hypertension     takes Carvedilol daily  . Pneumonia     hx of-in high school  . Shortness of breath     with exertion  . Headache(784.0)   . Back pain     occasionally  . GERD (gastroesophageal reflux disease)     takes Omeprazole daily as needed  . Urinary urgency   . Cataract     left and immature   Past Surgical History  Procedure Laterality Date  . 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 Bypass graft  Jan. 1997  . Spine surgery  march 2013    Back surgery  . Pr vein bypass graft,aorto-fem-pop  1997  . Colonoscopy with propofol N/A 03/10/2012    Procedure: COLONOSCOPY WITH PROPOFOL;  Surgeon: Garlan Fair, MD;  Location: [facility];  Service: Endoscopy;  Laterality: N/A;  . Esophagogastroduodenoscopy N/A 03/10/2012    Procedure: ESOPHAGOGASTRODUODENOSCOPY (EGD);  Surgeon: Garlan Fair, MD;  Location: [facility];  Service: Endoscopy;  Laterality: N/A;  . Tonsillectomy    . Coronary artery bypass graft  1997    x 6  . Corneal transplant Right   . Trigger finger release Left   . Endarterectomy Left 04/16/2013    Procedure: Left Carotid Artery Endatarectomy with Resection of Redundant Internal Carotid Artery;  Surgeon: Rosetta Posner, MD;  Location: [facility] OR;  Service: Vascular;  Laterality: Left;   Family History  Problem Relation Age of Onset  . Heart attack Father   . Heart disease Father 72    Before age of 43  . Hyperlipidemia Father   . Heart disease Brother     Heart dissease before age 53  . Hyperlipidemia Brother   . Heart attack Paternal Grandfather   . Heart disease Paternal Grandfather    Social History  Substance Use Topics  . Smoking status: Never Smoker   . Smokeless tobacco: Never Used  . Alcohol Use: No   OB History    No data available     Review of Systems  All other systems reviewed and are negative.  Allergies  Codeine; Latex; and Morphine and related  Home Medications   Prior to Admission medications   Medication Sig Start Date End Date Taking? Authorizing Provider  acetaminophen (TYLENOL) 500 MG tablet Take 1,000 mg by mouth every 6 (six) hours as needed for moderate pain. For pain   Yes Historical Provider, MD  Ascorbic Acid (VITAMIN C) 1000 MG tablet Take 1,000 mg by mouth daily.   Yes Historical Provider, MD  aspirin EC 81 MG tablet Take 81 mg by mouth every evening.    Yes Historical Provider, MD  atorvastatin (LIPITOR) 40 MG tablet Take 40 mg by mouth daily.    Yes Historical Provider, MD  Calcium Carbonate-Vitamin D (CALTRATE 600+D PO) Take 1 tablet by mouth daily.   Yes Historical Provider, MD  carvedilol (COREG) 25 MG tablet Take 1 tablet by mouth  twice a day with food 11/16/14  Yes Jettie Booze, MD  citalopram (CELEXA) 20 MG tablet Take 20 mg  by mouth every morning.    Yes Historical Provider, MD  clopidogrel (PLAVIX) 75 MG tablet Take 75 mg by mouth every morning.    Yes Historical Provider, MD  diphenhydrAMINE (BENADRYL) 25 MG tablet Take 25 mg by mouth every 6 (six) hours as needed for itching or allergies.   Yes Historical Provider, MD  fish oil-omega-3 fatty acids 1000 MG capsule Take 1 g by mouth daily.   Yes Historical Provider, MD  glipiZIDE (GLUCOTROL XL) 10 MG 24 hr tablet Take 10 mg by mouth daily.   Yes Historical Provider, MD  ibuprofen (ADVIL,MOTRIN) 200 MG tablet Take 800 mg by mouth every 6 (six) hours as needed for pain.    Yes Historical Provider, MD  lisinopril (PRINIVIL,ZESTRIL) 20 MG tablet Take 1 tablet by mouth  daily 11/16/14  Yes Jettie Booze, MD  meclizine (ANTIVERT) 25 MG tablet Take 25 mg by mouth 3 (three) times daily as needed. For dizziness   Yes Historical Provider, MD  metFORMIN (GLUCOPHAGE) 1000 MG tablet Take 1,000 mg by mouth 2 (two) times daily with a meal.  02/09/15  Yes Historical Provider, MD  methocarbamol (ROBAXIN) 500 MG tablet Take 500 mg by mouth every 8 (eight) hours as needed for muscle spasms.   Yes Historical Provider, MD  pantoprazole (PROTONIX) 40 MG tablet Take 40 mg by mouth daily.  01/10/15  Yes Historical Provider, MD  HYDROcodone-acetaminophen (NORCO/VICODIN) 5-325 MG tablet Take 1 tablet by mouth every 6 (six) hours as needed for moderate pain or severe pain.  02/10/15   Historical Provider, MD  nitroGLYCERIN (NITROSTAT) 0.4 MG SL tablet Place 1 tablet (0.4 mg total) under the tongue every 5 (five) minutes as needed. Chest pain. 09/28/13   Jettie Booze, MD  oxyCODONE-acetaminophen (PERCOCET/ROXICET) 5-325 MG per tablet Take 1 tablet by mouth every 6 (six) hours as needed for moderate pain or severe pain. Patient not taking: Reported on 05/19/2014 04/17/13   Ulyses Amor, PA-C  promethazine (PHENERGAN) 12.5 MG tablet Take 12.5-25 mg by mouth every 8 (eight) hours as needed.  nausea    Historical Provider, MD   BP 192/90 mmHg  Pulse 74  Temp(Src) 98.3 F (36.8 C) (Oral)  Resp 22  SpO2 100% Physical Exam  Constitutional: She is oriented to person, place, and time. She appears well-developed and well-nourished.  Non-toxic appearance. No distress.  HENT:  Head: Normocephalic and atraumatic.    Eyes: Conjunctivae, EOM and lids are normal. Pupils are equal, round, and reactive to light.  Neck: Normal range of motion. Neck supple. No tracheal deviation present. No thyroid mass present.  Cardiovascular: Normal rate, regular rhythm and normal heart sounds.  Exam reveals no gallop.   No murmur heard. Pulmonary/Chest: Effort normal and breath sounds normal. No stridor. No respiratory distress. She has no decreased breath sounds. She has no wheezes. She has no rhonchi. She has no rales.  Abdominal: Soft. Normal appearance and bowel sounds are normal. She exhibits no distension. There is no tenderness. There is no rebound and no CVA tenderness.  Musculoskeletal: Normal range of motion. She exhibits no edema or tenderness.  Neurological: She is alert and oriented to person, place, and time. She has normal strength. No cranial nerve deficit or sensory deficit. GCS eye subscore is 4. GCS verbal subscore is 5. GCS motor subscore is 6.  Skin: Skin is warm and dry. No abrasion and no rash noted.  Psychiatric: She has a normal mood and affect. Her speech is normal and behavior is normal.  Nursing note and vitals reviewed.   ED Course  Procedures (including critical care time) Labs Review Labs Reviewed  CBC WITH DIFFERENTIAL/PLATELET  BASIC METABOLIC PANEL    Imaging Review No results found. I have personally reviewed and evaluated these images and lab results as part of my medical decision-making.   EKG Interpretation None      MDM   Final diagnoses:  None    Patient's CT of her maxillofacial shows evidence of severe left maxillary sinusitis. OB given  prescription for antibiotics and follow-up with her Dr. blood pressure and blood sugar elevations noted and patient encouraged to restart her home medications    Lacretia Leigh, MD 02/18/15 1844  Lacretia Leigh, MD 02/18/15 [phone]

## 2015-02-18 NOTE — ED Notes (Signed)
She c/o "congestion" and "just not feeling well" x 2 days.  She is in no distress.

## 2015-02-24 ENCOUNTER — Encounter: Payer: Self-pay | Admitting: Family

## 2015-03-02 ENCOUNTER — Ambulatory Visit (INDEPENDENT_AMBULATORY_CARE_PROVIDER_SITE_OTHER): Payer: Medicare Other | Admitting: Family

## 2015-03-02 ENCOUNTER — Ambulatory Visit (HOSPITAL_COMMUNITY)
Admission: RE | Admit: 2015-03-02 | Discharge: 2015-03-02 | Disposition: A | Discharge status: Home / Self Care | Payer: Medicare Other | Source: Ambulatory Visit | Attending: Family | Admitting: Family

## 2015-03-02 ENCOUNTER — Encounter: Payer: Self-pay | Admitting: Family

## 2015-03-02 VITALS — BP 157/92 | HR 75 | Temp 97.4°F | Resp 16 | Ht 65.0 in | Wt 165.0 lb

## 2015-03-02 DIAGNOSIS — E785 Hyperlipidemia, unspecified: Secondary | ICD-10-CM | POA: Insufficient documentation

## 2015-03-02 DIAGNOSIS — Z9889 Other specified postprocedural states: Secondary | ICD-10-CM

## 2015-03-02 DIAGNOSIS — I6523 Occlusion and stenosis of bilateral carotid arteries: Secondary | ICD-10-CM

## 2015-03-02 DIAGNOSIS — Z48812 Encounter for surgical aftercare following surgery on the circulatory system: Secondary | ICD-10-CM

## 2015-03-02 DIAGNOSIS — I10 Essential (primary) hypertension: Secondary | ICD-10-CM | POA: Insufficient documentation

## 2015-03-02 DIAGNOSIS — E119 Type 2 diabetes mellitus without complications: Secondary | ICD-10-CM | POA: Diagnosis not present

## 2015-03-02 NOTE — Progress Notes (Signed)
Chief Complaint: Extracranial Carotid Artery Stenosis   History of Present Illness  Andrea Kirk is a 75 y.o. female patient of Dr. Donnetta Hutching who is s/p left CEA on 04/16/13. She returns today for follow up.  She denies any history of TIA or stroke symptoms.Specifically she denies a history of amaurosis fugax or monocular blindness, unilateral facial drooping, hemiparesis, or receptive or expressive aphasia.   She had back surgery in 2013 and has current back issues, is on work restriction due to back issues, she is a Scientist, water quality.  Pt Diabetic: Yes, states her last A1C was possibly 7.9 Pt smoker: non-smoker, but is sometimes exposed to secondhand smoke  Pt meds include: Statin : Yes ASA: Yes Other anticoagulants/antiplatelets: Plavix   Past Medical History  Diagnosis Date  . Hyperlipidemia     takes Atorvastatin daily  . Carotid artery occlusion   . Coronary artery disease   . Restless leg     takes Requip daily as needed  . Nausea     takes Phenergan daily as needed  . Muscle spasm     takes Robaxin daily as needed  . Dizziness     takes Meclizine daily as needed  . Seasonal allergies     takes Claritin daily as needed and Afrin as needed  . Diabetes mellitus     takes Metformin and Glipizide daily  . Anxiety     takes Celexa daily  . Hypertension     takes Carvedilol daily  . Pneumonia     hx of-in high school  . Shortness of breath     with exertion  . Headache(784.0)   . Back pain     occasionally  . GERD (gastroesophageal reflux disease)     takes Omeprazole daily as needed  . Urinary urgency   . Cataract     left and immature    Social History Social History  Substance Use Topics  . Smoking status: Never Smoker   . Smokeless tobacco: Never Used  . Alcohol Use: No    Family History Family History  Problem Relation Age of Onset  . Heart attack Father   . Heart disease Father 54    Before age of 84  . Hyperlipidemia Father   . Heart disease  Brother     Heart dissease before age 40  . Hyperlipidemia Brother   . Heart attack Paternal Grandfather   . Heart disease Paternal Grandfather     Surgical History Past Surgical History  Procedure Laterality Date  . 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 Bypass graft  Jan. 1997  . Spine surgery  march 2013    Back surgery  . Pr vein bypass graft,aorto-fem-pop  1997  . Colonoscopy with propofol N/A 03/10/2012    Procedure: COLONOSCOPY WITH PROPOFOL;  Surgeon: Garlan Fair, MD;  Location: [facility];  Service: Endoscopy;  Laterality: N/A;  . Esophagogastroduodenoscopy N/A 03/10/2012    Procedure: ESOPHAGOGASTRODUODENOSCOPY (EGD);  Surgeon: Garlan Fair, MD;  Location: [facility];  Service: Endoscopy;  Laterality: N/A;  . Tonsillectomy    . Coronary artery bypass graft  1997    x 6  . Corneal transplant Right   . Trigger finger release Left   . Endarterectomy Left 04/16/2013    Procedure: Left Carotid Artery Endatarectomy with Resection of Redundant Internal Carotid Artery;  Surgeon: Rosetta Posner, MD;  Location: [facility];  Service: Vascular;  Laterality: Left;  Allergies  Allergen Reactions  . Codeine Other (See Comments)    Abnormal behavior  . Latex Other (See Comments)    tears skin  . Morphine And Related Other (See Comments)    Affects BP and blood sugar.    Current Outpatient Prescriptions  Medication Sig Dispense Refill  . acetaminophen (TYLENOL) 500 MG tablet Take 1,000 mg by mouth every 6 (six) hours as needed for moderate pain. For pain    . amoxicillin-clavulanate (AUGMENTIN) 875-125 MG tablet Take 1 tablet by mouth 2 (two) times daily. 20 tablet 0  . Ascorbic Acid (VITAMIN C) 1000 MG tablet Take 1,000 mg by mouth daily.    Marland Kitchen aspirin EC 81 MG tablet Take 81 mg by mouth every evening.     Marland Kitchen atorvastatin (LIPITOR) 40 MG tablet Take 40 mg by mouth daily.     . Calcium Carbonate-Vitamin D (CALTRATE 600+D PO) Take 1 tablet by mouth daily.     . carvedilol (COREG) 25 MG tablet Take 1 tablet by mouth  twice a day with food 180 tablet 0  . citalopram (CELEXA) 20 MG tablet Take 20 mg by mouth every morning.     . clopidogrel (PLAVIX) 75 MG tablet Take 75 mg by mouth every morning.     . diphenhydrAMINE (BENADRYL) 25 MG tablet Take 25 mg by mouth every 6 (six) hours as needed for itching or allergies.    . fish oil-omega-3 fatty acids 1000 MG capsule Take 1 g by mouth daily.    Marland Kitchen glipiZIDE (GLUCOTROL XL) 10 MG 24 hr tablet Take 10 mg by mouth daily.    Marland Kitchen HYDROcodone-acetaminophen (NORCO/VICODIN) 5-325 MG tablet Take 1 tablet by mouth every 6 (six) hours as needed for moderate pain or severe pain.     Marland Kitchen ibuprofen (ADVIL,MOTRIN) 200 MG tablet Take 800 mg by mouth every 6 (six) hours as needed for pain.     Marland Kitchen lisinopril (PRINIVIL,ZESTRIL) 20 MG tablet Take 1 tablet by mouth  daily 90 tablet 0  . meclizine (ANTIVERT) 25 MG tablet Take 25 mg by mouth 3 (three) times daily as needed. For dizziness    . metFORMIN (GLUCOPHAGE) 1000 MG tablet Take 1,000 mg by mouth 2 (two) times daily with a meal.     . methocarbamol (ROBAXIN) 500 MG tablet Take 500 mg by mouth every 8 (eight) hours as needed for muscle spasms.    . nitroGLYCERIN (NITROSTAT) 0.4 MG SL tablet Place 1 tablet (0.4 mg total) under the tongue every 5 (five) minutes as needed. Chest pain. 25 tablet 5  . oxyCODONE-acetaminophen (PERCOCET/ROXICET) 5-325 MG per tablet Take 1 tablet by mouth every 6 (six) hours as needed for moderate pain or severe pain. (Patient not taking: Reported on 05/19/2014) 30 tablet 0  . pantoprazole (PROTONIX) 40 MG tablet Take 40 mg by mouth daily.     . promethazine (PHENERGAN) 12.5 MG tablet Take 12.5-25 mg by mouth every 8 (eight) hours as needed. nausea     No current facility-administered medications for this visit.    Review of Systems : See HPI for pertinent positives and negatives.  Physical Examination  Filed Vitals:   03/02/15 1557 03/02/15 1558   BP: 153/92 149/93  Pulse: 84 75  Temp: 97.4 F (36.3 C)   Resp: 16   Height: 5\' 5"  (1.651 m)   Weight: 165 lb (74.844 kg)   SpO2: 96%    Body mass index is 27.46 kg/(m^2).  General: WDWN female in NAD GAIT: normal  Eyes: PERRLA Pulmonary: Non-labored, CTAB Cardiac: regular rhythm, no detected murmur.  VASCULAR EXAM Carotid Bruits Left Right   Positive Negative   Radial pulses are 2+ palpable and equal.      LE Pulses LEFT RIGHT   POPLITEAL not palpable  not palpable   POSTERIOR TIBIAL  palpable   palpable    DORSALIS PEDIS  ANTERIOR TIBIAL palpable  palpable     Gastrointestinal: soft, nontender, BS WNL, no r/g,no palpable masses.  Musculoskeletal: No muscle atrophy/wasting. M/S 5/5 throughout, Extremities without ischemic changes.  Neurologic: A&O X 3; Appropriate Affect, Speech is normal CN 2-12 intact, Pain and light touch intact in extremities, Motor exam as listed above.                Non-Invasive Vascular Imaging CAROTID DUPLEX 03/02/2015   CEREBROVASCULAR DUPLEX EVALUATION    INDICATION: Carotid artery disease    PREVIOUS INTERVENTION(S): Left carotid endarterectomy 04/16/2013 by Dr. Donnetta Hutching    DUPLEX EXAM: Carotid duplex    RIGHT  LEFT  Peak Systolic Velocities (cm/s) End Diastolic Velocities (cm/s) Plaque LOCATION Peak Systolic Velocities (cm/s) End Diastolic Velocities (cm/s) Plaque  68 12  CCA PROXIMAL 71 16   57 17  CCA MID 88 23 HT  87 23 HT CCA DISTAL 74 23   127 18  ECA 154 23 HT  203 60 HT ICA PROXIMAL 175 58   123 27  ICA MID 177 41   74 19  ICA DISTAL 114 31     3.5 ICA / CCA Ratio (PSV) N/A  Antegrade Vertebral Flow Antegrade  - Brachial Systolic Pressure (mmHg) -  Triphasic Brachial Artery Waveforms Triphasic    Plaque  Morphology:  HM = Homogeneous, HT = Heterogeneous, CP = Calcific Plaque, SP = Smooth Plaque, IP = Irregular Plaque     ADDITIONAL FINDINGS: Multiphasic subclavian arteries: The mid internal carotid artery as a curve bilaterally    IMPRESSION: 1.40 - 59% right internal carotid artery stenosis 2. Patent left carotid endarterectomy site with velocity in the 40 - 59% stenosis range at distal patch site    Compared to the previous exam:  Unable to obtain increased velocity as shown on prior exam      Assessment: Aleiah Alton is a 75 y.o. female who is s/p left CEA on 04/16/13. She has no history of stroke or TIA. Today's carotid duplex suggests 40-59% bilateral ICA stenosis. No significant change from prior exam on 05/19/14.    Plan: Follow-up in 1 year with Carotid Duplex scan.   I discussed in depth with the patient the nature of atherosclerosis, and emphasized the importance of maximal medical management including strict control of blood pressure, blood glucose, and lipid levels, obtaining regular exercise, and continued cessation of smoking.  The patient is aware that without maximal medical management the underlying atherosclerotic disease process will progress, limiting the benefit of any interventions. The patient was given information about stroke prevention and what symptoms should prompt the patient to seek immediate medical care. Thank you for allowing [place] to participate in this patient's care.  Clemon Chambers, RN, MSN, FNP-C [facility] Office: [phone]  Clinic Physician: Scot Dock on call  03/02/2015 4:00 PM

## 2015-03-02 NOTE — Patient Instructions (Signed)
Stroke Prevention Some medical conditions and behaviors are associated with an increased chance of having a stroke. You may prevent a stroke by making healthy choices and managing medical conditions. HOW CAN I REDUCE MY RISK OF HAVING A STROKE?   Stay physically active. Get at least 30 minutes of activity on most or all days.  Do not smoke. It may also be helpful to avoid exposure to secondhand smoke.  Limit alcohol use. Moderate alcohol use is considered to be:  No more than 2 drinks per day for men.  No more than 1 drink per day for nonpregnant women.  Eat healthy foods. This involves:  Eating 5 or more servings of fruits and vegetables a day.  Making dietary changes that address high blood pressure (hypertension), high cholesterol, diabetes, or obesity.  Manage your cholesterol levels.  Making food choices that are high in fiber and low in saturated fat, trans fat, and cholesterol may control cholesterol levels.  Take any prescribed medicines to control cholesterol as directed by your health care provider.  Manage your diabetes.  Controlling your carbohydrate and sugar intake is recommended to manage diabetes.  Take any prescribed medicines to control diabetes as directed by your health care provider.  Control your hypertension.  Making food choices that are low in salt (sodium), saturated fat, trans fat, and cholesterol is recommended to manage hypertension.  Ask your health care provider if you need treatment to lower your blood pressure. Take any prescribed medicines to control hypertension as directed by your health care provider.  If you are 18-39 years of age, have your blood pressure checked every 3-5 years. If you are 40 years of age or older, have your blood pressure checked every year.  Maintain a healthy weight.  Reducing calorie intake and making food choices that are low in sodium, saturated fat, trans fat, and cholesterol are recommended to manage  weight.  Stop drug abuse.  Avoid taking birth control pills.  Talk to your health care provider about the risks of taking birth control pills if you are over 35 years old, smoke, get migraines, or have ever had a blood clot.  Get evaluated for sleep disorders (sleep apnea).  Talk to your health care provider about getting a sleep evaluation if you snore a lot or have excessive sleepiness.  Take medicines only as directed by your health care provider.  For some people, aspirin or blood thinners (anticoagulants) are helpful in reducing the risk of forming abnormal blood clots that can lead to stroke. If you have the irregular heart rhythm of atrial fibrillation, you should be on a blood thinner unless there is a good reason you cannot take them.  Understand all your medicine instructions.  Make sure that other conditions (such as anemia or atherosclerosis) are addressed. SEEK IMMEDIATE MEDICAL CARE IF:   You have sudden weakness or numbness of the face, arm, or leg, especially on one side of the body.  Your face or eyelid droops to one side.  You have sudden confusion.  You have trouble speaking (aphasia) or understanding.  You have sudden trouble seeing in one or both eyes.  You have sudden trouble walking.  You have dizziness.  You have a loss of balance or coordination.  You have a sudden, severe headache with no known cause.  You have new chest pain or an irregular heartbeat. Any of these symptoms may represent a serious problem that is an emergency. Do not wait to see if the symptoms will   go away. Get medical help at once. Call your local emergency services (911 in [place] Do not drive yourself to the hospital.   This information is not intended to replace advice given to you by your health care provider. Make sure you discuss any questions you have with your health care provider.   Document Released: 02/01/2004 Document Revised: 01/14/2014 Document Reviewed:  06/26/2012 Elsevier Interactive Patient Education 2016 [employer]  

## 2015-03-02 NOTE — Progress Notes (Signed)
Filed Vitals:   03/02/15 1557 03/02/15 1558 03/02/15 1559  BP: 153/92 149/93 157/92  Pulse: 84 75 75  Temp: 97.4 F (36.3 C)    Resp: 16    Height: 5\' 5"  (1.651 m)    Weight: 165 lb (74.844 kg)    SpO2: 96%

## 2015-03-30 ENCOUNTER — Other Ambulatory Visit: Payer: Self-pay | Admitting: Interventional Cardiology

## 2015-03-30 NOTE — Telephone Encounter (Signed)
REFILL 

## 2015-04-12 ENCOUNTER — Encounter: Payer: Self-pay | Admitting: Cardiology

## 2015-05-02 ENCOUNTER — Encounter: Payer: Self-pay | Admitting: Interventional Cardiology

## 2015-05-02 ENCOUNTER — Ambulatory Visit (INDEPENDENT_AMBULATORY_CARE_PROVIDER_SITE_OTHER): Payer: Medicare Other | Admitting: Interventional Cardiology

## 2015-05-02 VITALS — BP 130/60 | HR 64 | Ht 65.0 in | Wt 168.0 lb

## 2015-05-02 DIAGNOSIS — I2510 Atherosclerotic heart disease of native coronary artery without angina pectoris: Secondary | ICD-10-CM

## 2015-05-02 DIAGNOSIS — E782 Mixed hyperlipidemia: Secondary | ICD-10-CM

## 2015-05-02 DIAGNOSIS — E119 Type 2 diabetes mellitus without complications: Secondary | ICD-10-CM

## 2015-05-02 DIAGNOSIS — I6523 Occlusion and stenosis of bilateral carotid arteries: Secondary | ICD-10-CM

## 2015-05-02 DIAGNOSIS — E1159 Type 2 diabetes mellitus with other circulatory complications: Secondary | ICD-10-CM

## 2015-05-02 DIAGNOSIS — I10 Essential (primary) hypertension: Secondary | ICD-10-CM | POA: Diagnosis not present

## 2015-05-02 HISTORY — DX: Type 2 diabetes mellitus without complications: E11.9

## 2015-05-02 NOTE — Patient Instructions (Addendum)
Medication Instructions:  Stop taking Aspirin-all other medications remain the same  Labwork: None  Testing/Procedures: None  Follow-Up: Your physician wants you to follow-up in: 1 year. You will receive a reminder letter in the mail two months in advance. If you don't receive a letter, please call our office to schedule the follow-up appointment.     If you need a refill on your cardiac medications before your next appointment, please call your pharmacy.

## 2015-05-02 NOTE — Progress Notes (Signed)
Patient ID: Andrea Kirk, female   DOB: 09/09/1940, 75 y.o.   MRN: [id number]     Cardiology Office Note   Date:  05/02/2015   ID:  Andrea Kirk, DOB 15-Nov-1940, MRN [id number]  PCP:  Cammy Copa, MD    No chief complaint on file. f/u CAD   Wt Readings from Last 3 Encounters:  05/02/15 168 lb (76.204 kg)  03/02/15 165 lb (74.844 kg)  05/19/14 174 lb (78.926 kg)       History of Present Illness: [name] is a 75 y.o. female  who had CABG in 1997.  She suffered a fall in 2014 due to her knee giving way. She did not pass out at that time. She had significant bruising on her face at that time.   She has not fallen recently but is careful to avoid them.    She denies chest discomfort, diaphoresis, dizziness, leg edema, sublingual nitroglycerin use, palpitations, dyspnea on exertion.  She felt poorly a few months ago after getting two teeth pulled.  SHe was not eating well.  She calle dEMS. Her BP and blood sugars were off.  ECG was ok.  She was taken to the hospital by EMS.  She was hydrated.  She had some left facial numbness.  A CAT scan was done. She had a bad sinus infection.  She was treated with an antibiotic.   She works at [employer] on [employer].  She works only between 7A-7P for 4-5 hours at a time due to leg problems.          Past Medical History  Diagnosis Date  . Hyperlipidemia     takes Atorvastatin daily  . Carotid artery occlusion   . Coronary artery disease   . Restless leg     takes Requip daily as needed  . Nausea     takes Phenergan daily as needed  . Muscle spasm     takes Robaxin daily as needed  . Dizziness     takes Meclizine daily as needed  . Seasonal allergies     takes Claritin daily as needed and Afrin as needed  . Diabetes mellitus     takes Metformin and Glipizide daily  . Anxiety     takes Celexa daily  . Hypertension     takes Carvedilol daily  . Pneumonia     hx of-in high school  . Shortness of breath     with  exertion  . Headache(784.0)   . Back pain     occasionally  . GERD (gastroesophageal reflux disease)     takes Omeprazole daily as needed  . Urinary urgency   . Cataract     left and immature    Past Surgical History  Procedure Laterality Date  . 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 Bypass graft  Jan. 1997  . Spine surgery  march 2013    Back surgery  . Pr vein bypass graft,aorto-fem-pop  1997  . Colonoscopy with propofol N/A 03/10/2012    Procedure: COLONOSCOPY WITH PROPOFOL;  Surgeon: Garlan Fair, MD;  Location: [facility];  Service: Endoscopy;  Laterality: N/A;  . Esophagogastroduodenoscopy N/A 03/10/2012    Procedure: ESOPHAGOGASTRODUODENOSCOPY (EGD);  Surgeon: Garlan Fair, MD;  Location: [facility];  Service: Endoscopy;  Laterality: N/A;  . Tonsillectomy    . Coronary artery bypass graft  1997    x 6  . Corneal transplant Right   .  Trigger finger release Left   . Endarterectomy Left 04/16/2013    Procedure: Left Carotid Artery Endatarectomy with Resection of Redundant Internal Carotid Artery;  Surgeon: Rosetta Posner, MD;  Location: [facility];  Service: Vascular;  Laterality: Left;     Current Outpatient Prescriptions  Medication Sig Dispense Refill  . acetaminophen (TYLENOL) 500 MG tablet Take 1,000 mg by mouth every 6 (six) hours as needed for moderate pain. For pain    . amoxicillin-clavulanate (AUGMENTIN) 875-125 MG tablet Take 1 tablet by mouth 2 (two) times daily. 20 tablet 0  . Ascorbic Acid (VITAMIN C) 1000 MG tablet Take 1,000 mg by mouth daily.    Marland Kitchen aspirin EC 81 MG tablet Take 81 mg by mouth every evening.     Marland Kitchen atorvastatin (LIPITOR) 40 MG tablet Take 40 mg by mouth daily.     . Calcium Carbonate-Vitamin D (CALTRATE 600+D PO) Take 1 tablet by mouth daily.    . carvedilol (COREG) 25 MG tablet Take 1 tablet (25 mg total) by mouth 2 (two) times daily with a meal. KEEP OV. 180 tablet 0  . citalopram (CELEXA) 20 MG tablet Take 20 mg by  mouth every morning.     . clopidogrel (PLAVIX) 75 MG tablet Take 75 mg by mouth every morning.     . diphenhydrAMINE (BENADRYL) 25 MG tablet Take 25 mg by mouth every 6 (six) hours as needed for itching or allergies.    . fish oil-omega-3 fatty acids 1000 MG capsule Take 1 g by mouth daily.    Marland Kitchen glipiZIDE (GLUCOTROL XL) 10 MG 24 hr tablet Take 10 mg by mouth daily.    Marland Kitchen HYDROcodone-acetaminophen (NORCO/VICODIN) 5-325 MG tablet Take 1 tablet by mouth every 6 (six) hours as needed for moderate pain or severe pain.     Marland Kitchen ibuprofen (ADVIL,MOTRIN) 200 MG tablet Take 800 mg by mouth every 6 (six) hours as needed for pain.     Marland Kitchen lisinopril (PRINIVIL,ZESTRIL) 20 MG tablet Take 1 tablet (20 mg total) by mouth daily. KEEP OV. 90 tablet 0  . meclizine (ANTIVERT) 25 MG tablet Take 25 mg by mouth 3 (three) times daily as needed. For dizziness    . metFORMIN (GLUCOPHAGE) 1000 MG tablet Take 1,000 mg by mouth 2 (two) times daily with a meal.     . methocarbamol (ROBAXIN) 500 MG tablet Take 500 mg by mouth every 8 (eight) hours as needed for muscle spasms.    . nitroGLYCERIN (NITROSTAT) 0.4 MG SL tablet Place 1 tablet (0.4 mg total) under the tongue every 5 (five) minutes as needed. Chest pain. 25 tablet 5  . oxyCODONE-acetaminophen (PERCOCET/ROXICET) 5-325 MG per tablet Take 1 tablet by mouth every 6 (six) hours as needed for moderate pain or severe pain. 30 tablet 0  . pantoprazole (PROTONIX) 40 MG tablet Take 40 mg by mouth daily.     . promethazine (PHENERGAN) 12.5 MG tablet Take 12.5-25 mg by mouth every 8 (eight) hours as needed. nausea     No current facility-administered medications for this visit.    Allergies:   Codeine; Latex; and Morphine and related    Social History:  The patient  reports that she has never smoked. She has never used smokeless tobacco. She reports that she does not drink alcohol or use illicit drugs.   Family History:  The patient's family history includes Heart attack in her  father and paternal grandfather; Heart disease in her brother and paternal grandfather; Heart disease (age of  onset: 64) in her father; Hyperlipidemia in her brother and father.    ROS:  Please see the history of present illness.   Otherwise, review of systems are positive for recent sinus infection; appetite improving after recent UTI, fatigue.   All other systems are reviewed and negative.    PHYSICAL EXAM: VS:  BP 130/60 mmHg  Pulse 64  Ht 5\' 5"  (1.651 m)  Wt 168 lb (76.204 kg)  BMI 27.96 kg/m2  SpO2 99% , BMI Body mass index is 27.96 kg/(m^2). GEN: Well nourished, well developed, in no acute distress HEENT: normal Neck: no JVD, carotid bruits, or masses Cardiac: RRR; no murmurs, rubs, or gallops,no edema  Respiratory:  clear to auscultation bilaterally, normal work of breathing GI: soft, nontender, nondistended, + BS MS: no deformity or atrophy Skin: warm and dry, no rash Neuro:  Strength and sensation are intact Psych: euthymic mood, flat affect   EKG:   The ekg ordered today demonstrates NSR, no significant ST segment changes   Recent Labs: 06/17/2014: ALT 35 02/18/2015: BUN 17; Creatinine, Ser 0.68; Hemoglobin 12.1; Platelets 226; Potassium 3.7; Sodium 137   Lipid Panel No results found for: CHOL, TRIG, HDL, CHOLHDL, VLDL, LDLCALC, LDLDIRECT   Other studies Reviewed: Additional studies/ records that were reviewed today with results demonstrating: prior stress test.   ASSESSMENT AND PLAN:  1. CAD: Last stress test was in May 2012 which showed no ischemia and an ejection fraction 64%.  She continues on DAPT.  Stop aspirin, continue clopidogrel. 2. Hypertension: Blood pressure target less than 130/80. She needs to check her blood pressure at home. If it's consistently above the target, would consider increasing her ACE inhibitor. Continue carvedilol and lisinopril. 3. Hypercholesterolemia: Continue current lipid-lowering medications. This is followed by her primary care  doctor. Continue to try to eat healthy and exercise regularly as well.  Try to get 150 minutes of walking in per week.   4. Diabetes: Managed by endocrine and nutritionist.  COntinue current meds.  5. Carotid disease followed by Dr. Donnetta Hutching.   Current medicines are reviewed at length with the patient today.  The patient concerns regarding her medicines were addressed.  The following changes have been made:  No change  Labs/ tests ordered today include:   Orders Placed This Encounter  Procedures  . EKG 12-Lead    Recommend 150 minutes/week of aerobic exercise Low fat, low carb, high fiber diet recommended  Disposition:   FU in 1 year   Teresita Madura., MD  05/02/2015 8:22 AM    [facility] [address], [place], [place]  [address] Phone: [phone]; Fax: [phone]

## 2015-05-04 ENCOUNTER — Encounter: Payer: Medicare Other | Attending: Family Medicine | Admitting: Skilled Nursing Facility1

## 2015-05-04 ENCOUNTER — Encounter: Payer: Self-pay | Admitting: Skilled Nursing Facility1

## 2015-05-04 VITALS — Ht 65.0 in | Wt 165.0 lb

## 2015-05-04 DIAGNOSIS — E119 Type 2 diabetes mellitus without complications: Secondary | ICD-10-CM | POA: Diagnosis not present

## 2015-05-04 NOTE — Progress Notes (Signed)
Diabetes Self-Management Education  Visit Type: First/Initial  Appt. Start Time: 11:00 Appt. End Time: 12:30  05/04/2015  [name], identified by name and date of birth, is a 75 y.o. female with a diagnosis of Diabetes: Type 2.   ASSESSMENT  Height 5\' 5"  (1.651 m), weight 165 lb (74.844 kg). Body mass index is 27.46 kg/(m^2).  Pt states she has had a sinus infection that resulted in a lack of appetite and depression (February/March). Pt states she had a urinary tract infection in March. 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 Pt states all she wants to do is sleep. Pt states she takes pain medication for her back and legs. Pt states she does not check her blood sugar because insurance does not cover the strips for her glucometer. Pt spoke very slowly and paused often but seemed to understand the information.       Diabetes Self-Management Education - 05/04/15 1119    Visit Information   Visit Type First/Initial   Initial Visit   Diabetes Type Type 2   Are you currently following a meal plan? No   Are you taking your medications as prescribed? Yes   Date Diagnosed long time ago   Health Coping   How would you rate your overall health? Fair   Psychosocial Assessment   Patient Belief/Attitude about Diabetes Denial   Self-care barriers Unsteady gait/risk for falls   Self-management support Family   Patient Concerns Nutrition/Meal planning   Special Needs None   Learning Readiness Not Ready   Complications   Last HgB A1C per patient/outside source 7.5 %   Have you had a dilated eye exam in the past 12 months? Yes   Have you had a dental exam in the past 12 months? Yes   Are you checking your feet? Yes   How many days per week are you checking your feet? 3   Dietary Intake   Breakfast cereal----applesauce or yogurt   Snack (morning) protein bar   Lunch salad   Dinner Tourist information centre manager) water, diet soda   Exercise   Exercise  Type Light (walking / raking leaves)   How many days per week to you exercise? 3   How many minutes per day do you exercise? 15   Total minutes per week of exercise 45   Patient Education   Previous Diabetes Education No   Disease state  Definition of diabetes, type 1 and 2, and the diagnosis of diabetes;Factors that contribute to the development of diabetes   Nutrition management  Role of diet in the treatment of diabetes and the relationship between the three main macronutrients and blood glucose level;Carbohydrate counting;Food label reading, portion sizes and measuring food.;Reviewed blood glucose goals for pre and post meals and how to evaluate the patients' food intake on their blood glucose level.;Effects of alcohol on blood glucose and safety factors with consumption of alcohol.;Information on hints to eating out and maintain blood glucose control.;Meal options for control of blood glucose level and chronic complications.   Physical activity and exercise  Role of exercise on diabetes management, blood pressure control and cardiac health.;Helped patient identify appropriate exercises in relation to his/her diabetes, diabetes complications and other health issue.   Monitoring Daily foot exams;Yearly dilated eye exam;Purpose and frequency of SMBG.   Chronic complications Lipid levels, blood glucose control and heart disease;Dental care;Assessed and discussed foot care and prevention of foot problems  Psychosocial adjustment Role of stress on diabetes   Individualized Goals (developed by patient)   Nutrition Follow meal plan discussed;General guidelines for healthy choices and portions discussed   Physical Activity Exercise 1-2 times per week   Medications take my medication as prescribed   Monitoring  test my blood glucose as discussed   Reducing Risk do foot checks daily;examine blood glucose patterns;increase portions of nuts and seeds;increase portions of healthy fats   Outcomes    Expected Outcomes Demonstrated interest in learning. Expect positive outcomes   Future DMSE PRN   Program Status Completed      Individualized Plan for Diabetes Self-Management Training:   Learning Objective:  Patient will have a greater understanding of diabetes self-management. Patient education plan is to attend individual and/or group sessions per assessed needs and concerns.   Plan:  Try to be physically active most days of the week Ask your doctor for a glucometer prescription where the strips to be covered  Expected Outcomes:  Demonstrated interest in learning. Expect positive outcomes  Education material provided: Living Well with Diabetes, Meal plan card and Snack sheet  If problems or questions, patient to contact team via:  Phone  Future DSME appointment: PRN

## 2015-06-05 ENCOUNTER — Other Ambulatory Visit: Payer: Self-pay | Admitting: Interventional Cardiology

## 2015-07-20 ENCOUNTER — Ambulatory Visit
Admission: RE | Admit: 2015-07-20 | Discharge: 2015-07-20 | Disposition: A | Discharge status: Home / Self Care | Payer: Medicare Other | Source: Ambulatory Visit | Attending: Family Medicine | Admitting: Family Medicine

## 2015-07-20 ENCOUNTER — Other Ambulatory Visit: Payer: Self-pay | Admitting: Family Medicine

## 2015-07-20 DIAGNOSIS — M25552 Pain in left hip: Secondary | ICD-10-CM

## 2015-07-22 ENCOUNTER — Other Ambulatory Visit: Payer: Self-pay | Admitting: Family Medicine

## 2015-07-22 DIAGNOSIS — N6320 Unspecified lump in the left breast, unspecified quadrant: Secondary | ICD-10-CM

## 2015-07-27 ENCOUNTER — Ambulatory Visit
Admission: RE | Admit: 2015-07-27 | Discharge: 2015-07-27 | Disposition: A | Discharge status: Home / Self Care | Payer: Medicare Other | Source: Ambulatory Visit | Attending: Family Medicine | Admitting: Family Medicine

## 2015-07-27 ENCOUNTER — Other Ambulatory Visit: Payer: Medicare Other

## 2015-07-27 DIAGNOSIS — N6320 Unspecified lump in the left breast, unspecified quadrant: Secondary | ICD-10-CM

## 2015-09-07 ENCOUNTER — Ambulatory Visit (INDEPENDENT_AMBULATORY_CARE_PROVIDER_SITE_OTHER): Payer: Medicare Other

## 2015-09-07 ENCOUNTER — Ambulatory Visit (INDEPENDENT_AMBULATORY_CARE_PROVIDER_SITE_OTHER): Payer: Medicare Other | Admitting: Podiatry

## 2015-09-07 ENCOUNTER — Encounter: Payer: Self-pay | Admitting: Podiatry

## 2015-09-07 VITALS — BP 127/68 | HR 69 | Resp 16 | Ht 65.0 in | Wt 169.0 lb

## 2015-09-07 DIAGNOSIS — M2040 Other hammer toe(s) (acquired), unspecified foot: Secondary | ICD-10-CM

## 2015-09-07 DIAGNOSIS — M2010 Hallux valgus (acquired), unspecified foot: Secondary | ICD-10-CM | POA: Diagnosis not present

## 2015-09-07 DIAGNOSIS — E119 Type 2 diabetes mellitus without complications: Secondary | ICD-10-CM

## 2015-09-07 DIAGNOSIS — Q828 Other specified congenital malformations of skin: Secondary | ICD-10-CM | POA: Diagnosis not present

## 2015-09-07 NOTE — Progress Notes (Signed)
   Subjective:    Patient ID: Andrea Kirk, female    DOB: [birth year], 75 y.o.   MRN: [id number]  HPI: She presents today with a chief complaint of a painful second digit of the left foot 6 months. States this started while she was wearing flip-flops and sandals and was rubbing her second toe.    Review of Systems  All other systems reviewed and are negative.      Objective:   Physical Exam: Vital signs are stable she is alert and oriented 3. Pulses are palpable. She has a history of diabetes. Neurologic sensorium is intact. Deep tendon reflexes are intact. She has severe HAV deformity hammertoe deformities. Cutaneous evaluation demonstrates reactive hyperkeratosis to the medial aspect of the PIPJ second digit of the left foot with an abduction deformity at the PIPJ. This is resulting in a juxtaposition and irritation of the toe. Radiographs demonstrate hallux abductovalgus deformity no osseous abnormalities as far as infection goes the severe hammertoe deformities osteopenia and hallux valgus deformities.       Assessment & Plan:  Ultimately she ends up with a superficial diabetic ulceration left foot. This should going to heal quite nicely due to her blood flow.  Plan: Debridement of reactive hyperkeratosis today recommended that she start soaking Epsom salts and warm water at least every other day. Provided her with padding to help prevent the toes from rubbing. Follow-up with her in 1-2 weeks.

## 2015-10-19 ENCOUNTER — Ambulatory Visit (INDEPENDENT_AMBULATORY_CARE_PROVIDER_SITE_OTHER): Payer: Medicare Other | Admitting: Podiatry

## 2015-10-19 ENCOUNTER — Encounter: Payer: Self-pay | Admitting: Podiatry

## 2015-10-19 DIAGNOSIS — L97521 Non-pressure chronic ulcer of other part of left foot limited to breakdown of skin: Secondary | ICD-10-CM

## 2015-10-19 DIAGNOSIS — L89891 Pressure ulcer of other site, stage 1: Secondary | ICD-10-CM

## 2015-10-19 NOTE — Progress Notes (Signed)
She presents today for follow-up of a ulceration to the second toe left foot. States this seems still be quite sore.  Objective: Vital signs are stable she is alert and oriented 3 there is no erythema cellulitis drainage or odor. Reactive hyperkeratosis to the medial aspect of the second digit left foot does demonstrate superficial ulceration upon debridement with no probing to bone.  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  Plan: Debridement overreactive hyperkeratotic tissue and ulcer today. Dispensed padding. Follow up with her in a couple weeks in which time we may discuss amputation.

## 2015-12-19 ENCOUNTER — Ambulatory Visit: Payer: Medicare Other | Admitting: Podiatry

## 2016-01-09 ENCOUNTER — Ambulatory Visit: Payer: Medicare Other | Admitting: Podiatry

## 2016-01-30 ENCOUNTER — Ambulatory Visit (INDEPENDENT_AMBULATORY_CARE_PROVIDER_SITE_OTHER): Payer: Medicare Other | Admitting: Podiatry

## 2016-01-30 DIAGNOSIS — M2040 Other hammer toe(s) (acquired), unspecified foot: Secondary | ICD-10-CM | POA: Diagnosis not present

## 2016-01-30 DIAGNOSIS — L97521 Non-pressure chronic ulcer of other part of left foot limited to breakdown of skin: Secondary | ICD-10-CM | POA: Diagnosis not present

## 2016-01-31 NOTE — Progress Notes (Signed)
She presents today for follow-up of ulceration to the second toe of the left foot. She states this seems to be doing okay and I stopped wearing the dressing and the sleeves.  Objective: Vital signs are stable she is alert and oriented 3 I debrided the reactive hyperkeratotic tissue to the medial aspect of the second PIPJ of the left foot today there is no erythema cellulitis drainage or odor no open wounds and no bleeding.  Assessment: Well-healing ulceration with hammertoe deformity bunion deformity left  Plan: Debrided reactive hyperkeratotic tissue discussed wearing the silicone sleeve to prevent this from recurring she understands and is amenable to will follow up with me as needed.

## 2016-02-29 ENCOUNTER — Other Ambulatory Visit: Payer: Self-pay | Admitting: *Deleted

## 2016-02-29 DIAGNOSIS — I6523 Occlusion and stenosis of bilateral carotid arteries: Secondary | ICD-10-CM

## 2016-03-01 ENCOUNTER — Encounter: Payer: Self-pay | Admitting: Family

## 2016-03-07 ENCOUNTER — Ambulatory Visit (INDEPENDENT_AMBULATORY_CARE_PROVIDER_SITE_OTHER): Payer: Medicare Other | Admitting: Family

## 2016-03-07 ENCOUNTER — Encounter: Payer: Self-pay | Admitting: Family

## 2016-03-07 ENCOUNTER — Ambulatory Visit (HOSPITAL_COMMUNITY)
Admission: RE | Admit: 2016-03-07 | Discharge: 2016-03-07 | Disposition: A | Discharge status: Home / Self Care | Payer: Medicare Other | Source: Ambulatory Visit | Attending: Vascular Surgery | Admitting: Vascular Surgery

## 2016-03-07 VITALS — BP 102/74 | HR 74 | Temp 99.3°F | Resp 18 | Ht 65.0 in | Wt 166.0 lb

## 2016-03-07 DIAGNOSIS — I6523 Occlusion and stenosis of bilateral carotid arteries: Secondary | ICD-10-CM | POA: Diagnosis present

## 2016-03-07 DIAGNOSIS — Z9889 Other specified postprocedural states: Secondary | ICD-10-CM | POA: Diagnosis not present

## 2016-03-07 LAB — VAS US CAROTID
LEFT ECA DIAS: -27 cm/s
LEFT VERTEBRAL DIAS: -22 cm/s
Left CCA dist dias: -30 cm/s
Left CCA dist sys: -239 cm/s
Left CCA prox dias: 22 cm/s
Left CCA prox sys: 85 cm/s
Left ICA dist dias: -38 cm/s
Left ICA dist sys: -105 cm/s
Left ICA prox dias: -61 cm/s
Left ICA prox sys: -190 cm/s
RIGHT CCA MID DIAS: 15 cm/s
RIGHT ECA DIAS: -17 cm/s
RIGHT VERTEBRAL DIAS: -20 cm/s
Right CCA prox dias: -10 cm/s
Right CCA prox sys: -88 cm/s
Right cca dist sys: 85 cm/s

## 2016-03-07 NOTE — Progress Notes (Signed)
Chief Complaint: Follow up Extracranial Carotid Artery Stenosis   History of Present Illness  Andrea Kirk is a 76 y.o. female patient of Dr. Donnetta Hutching who is s/p left CEA on 04/16/13. She returns today for follow up.  She denies any history of TIA or stroke symptoms.Specifically she denies a history of amaurosis fugax or monocular blindness, unilateral facial drooping, hemiparesis, or receptive or expressive aphasia.   She had back surgery in 2013 and has current back issues, is on work restriction due to back issues, she is a Scientist, water quality. She attends a pain management clinic.   Pt Diabetic: Yes, states her last A1C was possibly 7.9 Pt smoker: non-smoker, but was sometimes exposed to secondhand smoke  Pt meds include: Statin : Yes ASA: Yes Other anticoagulants/antiplatelets: Plavix    Past Medical History:  Diagnosis Date  . Anxiety    takes Celexa daily  . Back pain    occasionally  . Carotid artery occlusion   . Cataract    left and immature  . Coronary artery disease   . Diabetes mellitus    takes Metformin and Glipizide daily  . Dizziness    takes Meclizine daily as needed  . GERD (gastroesophageal reflux disease)    takes Omeprazole daily as needed  . Headache(784.0)   . Hyperlipidemia    takes Atorvastatin daily  . Hypertension    takes Carvedilol daily  . Muscle spasm    takes Robaxin daily as needed  . Nausea    takes Phenergan daily as needed  . Pneumonia    hx of-in high school  . Restless leg    takes Requip daily as needed  . Seasonal allergies    takes Claritin daily as needed and Afrin as needed  . Shortness of breath    with exertion  . Urinary urgency     Social History Social History  Substance Use Topics  . Smoking status: Never Smoker  . Smokeless tobacco: Never Used  . Alcohol use No    Family History Family History  Problem Relation Age of Onset  . Heart attack Father   . Heart disease Father 2    Before age of 41  .  Hyperlipidemia Father   . Heart disease Brother     Heart dissease before age 82  . Hyperlipidemia Brother   . Heart attack Paternal Grandfather   . Heart disease Paternal Grandfather     Surgical History Past Surgical History:  Procedure Laterality Date  . BYPASS GRAFT  Jan. 1997  . COLONOSCOPY WITH PROPOFOL N/A 03/10/2012   Procedure: COLONOSCOPY WITH PROPOFOL;  Surgeon: Garlan Fair, MD;  Location: [facility];  Service: Endoscopy;  Laterality: N/A;  . CORNEAL TRANSPLANT Right   . CORONARY ARTERY BYPASS GRAFT  1997   x 6  . ENDARTERECTOMY Left 04/16/2013   Procedure: Left Carotid Artery Endatarectomy with Resection of Redundant Internal Carotid Artery;  Surgeon: Rosetta Posner, MD;  Location: [facility] OR;  Service: Vascular;  Laterality: Left;  . ESOPHAGOGASTRODUODENOSCOPY N/A 03/10/2012   Procedure: ESOPHAGOGASTRODUODENOSCOPY (EGD);  Surgeon: Garlan Fair, MD;  Location: [facility];  Service: Endoscopy;  Laterality: N/A;  . EYE SURGERY  March 12, 2001   CORNEA TRANSPLANT Right eye  . PR VEIN BYPASS GRAFT,AORTO-FEM-POP  1997  . SPINE SURGERY  march 2013   Back surgery  . TONSILLECTOMY    . TRIGGER FINGER RELEASE Left     Allergies  Allergen Reactions  . Codeine Other (See Comments)  Abnormal behavior  . Latex Other (See Comments)    tears skin  . Morphine And Related Other (See Comments)    Affects BP and blood sugar.    Current Outpatient Prescriptions  Medication Sig Dispense Refill  . acetaminophen (TYLENOL) 500 MG tablet Take 1,000 mg by mouth every 6 (six) hours as needed for moderate pain. For pain    . Ascorbic Acid (VITAMIN C) 1000 MG tablet Take 1,000 mg by mouth daily.    Marland Kitchen aspirin EC 81 MG tablet Take 81 mg by mouth daily.    Marland Kitchen atorvastatin (LIPITOR) 40 MG tablet Take 40 mg by mouth daily.     . Calcium Carbonate-Vitamin D (CALTRATE 600+D PO) Take 1 tablet by mouth daily.    . carvedilol (COREG) 25 MG tablet TAKE 1 TABLET BY MOUTH 2  TIMES DAILY WITH A MEAL  180 tablet 3  . citalopram (CELEXA) 20 MG tablet Take 20 mg by mouth every morning.     . clopidogrel (PLAVIX) 75 MG tablet Take 75 mg by mouth every morning.     . diphenhydrAMINE (BENADRYL) 25 MG tablet Take 25 mg by mouth every 6 (six) hours as needed for itching or allergies.    . fish oil-omega-3 fatty acids 1000 MG capsule Take 1 g by mouth daily.    Marland Kitchen gabapentin (NEURONTIN) 300 MG capsule     . glipiZIDE (GLUCOTROL XL) 10 MG 24 hr tablet Take 10 mg by mouth daily.    Marland Kitchen HYDROcodone-acetaminophen (NORCO) 10-325 MG tablet     . ibuprofen (ADVIL,MOTRIN) 200 MG tablet Take 800 mg by mouth every 6 (six) hours as needed for pain.     Marland Kitchen lisinopril (PRINIVIL,ZESTRIL) 20 MG tablet TAKE 1 TABLET BY MOUTH  DAILY. KEEP OFFICE VISIT. 90 tablet 3  . meclizine (ANTIVERT) 25 MG tablet Take 25 mg by mouth 3 (three) times daily as needed. For dizziness    . metFORMIN (GLUCOPHAGE) 1000 MG tablet Take 1,000 mg by mouth 2 (two) times daily with a meal.     . methocarbamol (ROBAXIN) 500 MG tablet Take 500 mg by mouth every 8 (eight) hours as needed for muscle spasms.    . nitroGLYCERIN (NITROSTAT) 0.4 MG SL tablet Place 1 tablet (0.4 mg total) under the tongue every 5 (five) minutes as needed. Chest pain. 25 tablet 5  . pantoprazole (PROTONIX) 40 MG tablet Take 40 mg by mouth daily.     . promethazine (PHENERGAN) 12.5 MG tablet Take 12.5-25 mg by mouth every 8 (eight) hours as needed. nausea     No current facility-administered medications for this visit.     Review of Systems : See HPI for pertinent positives and negatives.  Physical Examination  Vitals:   03/07/16 1455 03/07/16 1458  BP: 133/68 102/74  Pulse: 72 74  Resp: 18   Temp: 99.3 F (37.4 C)   TempSrc: Oral   SpO2: 97%   Weight: 166 lb (75.3 kg)   Height: 5\' 5"  (1.651 m)    Body mass index is 27.62 kg/m.  General: WDWN female in NAD GAIT: normal Eyes: PERRLA Pulmonary: Respirations are non-labored, CTAB Cardiac: regular rhythm,  no detected murmur.  VASCULAR EXAM Carotid Bruits Left Right   Positive positive   Radial pulses are 2+ palpable and equal.      LE Pulses LEFT RIGHT   POPLITEAL not palpable  not palpable   POSTERIOR TIBIAL  palpable   palpable    DORSALIS PEDIS  ANTERIOR TIBIAL  palpable  palpable     Gastrointestinal: soft, nontender, BS WNL, no r/g,no palpable masses.  Musculoskeletal: No muscle atrophy/wasting. M/S 5/5 throughout, Extremities without ischemic changes.  Neurologic: A&O X 3; Appropriate Affect, Speech is normal CN 2-12 intact, Pain and light touch intact in extremities, Motor exam as listed above    Assessment: Timya Hilyard is a 76 y.o. female who is s/p left CEA on 04/16/13. She has no history of stroke or TIA.  Her atherosclerotic risk factors include DM and exposure to secondhand smoke in the past.  She takes a statin, Plavix, and ASA daily.   I spoke with pt's brother on her cell phone at pt request, who is a retired Chief Strategy Officer.  I discussed with Dr. Oneida Alar pt carotid duplex results from today and her HPI.   DATA Today's carotid duplex suggests 80-99% right ICA stenosis and 40-59% left ICA stenosis. Bilateral vertebral artery flow is antegrade.  Bilateral subclavian artery waveforms are normal.  Stable on the left but significant increase in the right ICA stenosis since the last duplex on 03-02-15.     Plan:  Follow-up with Dr. Donnetta Hutching as soon as possible to discuss possible right CEA.   I discussed in depth with the patient the nature of atherosclerosis, and emphasized the importance of maximal medical management including strict control of blood pressure, blood glucose, and lipid levels, obtaining regular exercise, and continued cessation of smoking.  The patient  is aware that without maximal medical management the underlying atherosclerotic disease process will progress, limiting the benefit of any interventions. The patient was given information about stroke prevention and what symptoms should prompt the patient to seek immediate medical care. Thank you for allowing [place] to participate in this patient's care.  Clemon Chambers, RN, MSN, FNP-C [facility] [place] Office: [phone]  Clinic Physician: Oneida Alar  03/07/16 3:07 PM

## 2016-03-07 NOTE — Patient Instructions (Addendum)
Preventing Cerebrovascular Disease Arteries are blood vessels that carry blood that contains oxygen from the heart to all parts of the body. Cerebrovascular disease affects arteries that supply the brain. Any condition that blocks or disrupts blood flow to the brain can cause cerebrovascular disease. Brain cells that lose blood supply start to die within minutes (stroke). Stroke is the main danger of cerebrovascular disease. Atherosclerosis and high blood pressure are common causes of cerebrovascular disease. Atherosclerosis is narrowing and hardening of an artery that results when fat, cholesterol, calcium, or other substances (plaque) build up inside an artery. Plaque reduces blood flow through the artery. High blood pressure increases the risk of bleeding inside the brain. Making diet and lifestyle changes to prevent atherosclerosis and high blood pressure lowers your risk of cerebrovascular disease. What nutrition changes can be made?  Eat more fruits, vegetables, and whole grains.  Reduce how much saturated fat you eat. To do this, eat less red meat and fewer full-fat dairy products.  Eat healthy proteins instead of red meat. Healthy proteins include:  Fish. Eat fish that contains heart-healthy omega-3 fatty acids, twice a week. Examples include salmon, albacore tuna, mackerel, and herring.  Chicken.  Nuts.  Low-fat or nonfat yogurt.  Avoid processed meats, like bacon and lunchmeat.  Avoid foods that contain:  A lot of sugar, such as sweets and drinks with added sugar.  A lot of salt (sodium). Avoid adding extra salt to your food, as told by your health care provider.  Trans fats, such as margarine and baked goods. Trans fats may be listed as "partially hydrogenated oils" on food labels.  Check food labels to see how much sodium, sugar, and trans fats are in foods.  Use vegetable oils that contain low amounts of saturated fat, such as olive oil or canola oil. What lifestyle  changes can be made?  Drink alcohol in moderation. This means no more than 1 drink a day for nonpregnant women and 2 drinks a day for men. One drink equals 12 oz of beer, 5 oz of wine, or 1 oz of hard liquor.  If you are overweight, ask your health care provider to recommend a weight-loss plan for you. Losing 5-10 lb (2.2-4.5 kg) can reduce your risk of diabetes, atherosclerosis, and high blood pressure.  Exercise for 30?60 minutes on most days, or as much as told by your health care provider.  Do moderate-intensity exercise, such as brisk walking, bicycling, and water aerobics. Ask your health care provider which activities are safe for you.  Do not use any products that contain nicotine or tobacco, such as cigarettes and e-cigarettes. If you need help quitting, ask your health care provider. Why are these changes important? Making these changes lowers your risk of many diseases that can cause cerebrovascular disease and stroke. Stroke is a leading cause of death and disability. Making these changes also improves your overall health and quality of life. What can I do to lower my risk? The following factors make you more likely to develop cerebrovascular disease:  Being overweight.  Smoking.  Being physically inactive.  Eating a high-fat diet.  Having certain health conditions, such as:  Diabetes.  High blood pressure.  Heart disease.  Atherosclerosis.  High cholesterol.  Sickle cell disease. Talk with your health care provider about your risk for cerebrovascular disease. Work with your health care provider to control diseases that you have that may contribute to cerebrovascular disease. Your health care provider may prescribe medicines to help prevent major   causes of cerebrovascular disease. Where to find more information: Learn more about preventing cerebrovascular disease from:  [place], Lung, and [place]:  MoAnalyst.de  Centers for Disease Control and Prevention: http://www.curry-wood.biz/ Summary  Cerebrovascular disease can lead to a stroke.  Atherosclerosis and high blood pressure are major causes of cerebrovascular disease.  Making diet and lifestyle changes can reduce your risk of cerebrovascular disease.  Work with your health care provider to get your risk factors under control to reduce your risk of cerebrovascular disease. This information is not intended to replace advice given to you by your health care provider. Make sure you discuss any questions you have with your health care provider. Document Released: 01/08/2015 Document Revised: 07/14/2015 Document Reviewed: 01/08/2015 Elsevier Interactive Patient Education  2017 [employer].    Stroke Prevention Some medical conditions and behaviors are associated with an increased chance of having a stroke. You may prevent a stroke by making healthy choices and managing medical conditions. How can I reduce my risk of having a stroke?  Stay physically active. Get at least 30 minutes of activity on most or all days.  Do not smoke. It may also be helpful to avoid exposure to secondhand smoke.  Limit alcohol use. Moderate alcohol use is considered to be:  No more than 2 drinks per day for men.  No more than 1 drink per day for nonpregnant women.  Eat healthy foods. This involves:  Eating 5 or more servings of fruits and vegetables a day.  Making dietary changes that address high blood pressure (hypertension), high cholesterol, diabetes, or obesity.  Manage your cholesterol levels.  Making food choices that are high in fiber and low in saturated fat, trans fat, and cholesterol may control cholesterol levels.  Take any prescribed medicines to control cholesterol as directed by your health care provider.  Manage your diabetes.  Controlling your carbohydrate and sugar intake is  recommended to manage diabetes.  Take any prescribed medicines to control diabetes as directed by your health care provider.  Control your hypertension.  Making food choices that are low in salt (sodium), saturated fat, trans fat, and cholesterol is recommended to manage hypertension.  Ask your health care provider if you need treatment to lower your blood pressure. Take any prescribed medicines to control hypertension as directed by your health care provider.  If you are 74-78 years of age, have your blood pressure checked every 3-5 years. If you are 35 years of age or older, have your blood pressure checked every year.  Maintain a healthy weight.  Reducing calorie intake and making food choices that are low in sodium, saturated fat, trans fat, and cholesterol are recommended to manage weight.  Stop drug abuse.  Avoid taking birth control pills.  Talk to your health care provider about the risks of taking birth control pills if you are over 36 years old, smoke, get migraines, or have ever had a blood clot.  Get evaluated for sleep disorders (sleep apnea).  Talk to your health care provider about getting a sleep evaluation if you snore a lot or have excessive sleepiness.  Take medicines only as directed by your health care provider.  For some people, aspirin or blood thinners (anticoagulants) are helpful in reducing the risk of forming abnormal blood clots that can lead to stroke. If you have the irregular heart rhythm of atrial fibrillation, you should be on a blood thinner unless there is a good reason you cannot take them.  Understand all your  medicine instructions.  Make sure that other conditions (such as anemia or atherosclerosis) are addressed. Get help right away if:  You have sudden weakness or numbness of the face, arm, or leg, especially on one side of the body.  Your face or eyelid droops to one side.  You have sudden confusion.  You have trouble speaking  (aphasia) or understanding.  You have sudden trouble seeing in one or both eyes.  You have sudden trouble walking.  You have dizziness.  You have a loss of balance or coordination.  You have a sudden, severe headache with no known cause.  You have new chest pain or an irregular heartbeat. Any of these symptoms may represent a serious problem that is an emergency. Do not wait to see if the symptoms will go away. Get medical help at once. Call your local emergency services (911 in [place] Do not drive yourself to the hospital. This information is not intended to replace advice given to you by your health care provider. Make sure you discuss any questions you have with your health care provider. Document Released: 02/01/2004 Document Revised: 06/01/2015 Document Reviewed: 06/26/2012 Elsevier Interactive Patient Education  2017 [employer].

## 2016-03-12 ENCOUNTER — Encounter: Payer: Self-pay | Admitting: Vascular Surgery

## 2016-03-12 ENCOUNTER — Ambulatory Visit: Payer: Medicare Other | Admitting: Vascular Surgery

## 2016-03-12 ENCOUNTER — Other Ambulatory Visit: Payer: Self-pay

## 2016-03-12 ENCOUNTER — Ambulatory Visit (INDEPENDENT_AMBULATORY_CARE_PROVIDER_SITE_OTHER): Payer: Medicare Other | Admitting: Vascular Surgery

## 2016-03-12 VITALS — BP 155/81 | HR 61 | Temp 98.7°F | Resp 16 | Ht 65.0 in | Wt 174.0 lb

## 2016-03-12 DIAGNOSIS — I6521 Occlusion and stenosis of right carotid artery: Secondary | ICD-10-CM | POA: Diagnosis not present

## 2016-03-12 NOTE — Progress Notes (Signed)
 [facility] [place]  Patient name: Andrea Kirk MRN: [id number] DOB: 01/20/1940 Sex: female  REASON FOR VISIT: discuss right carotid endarterectomy  HPI: Andrea Kirk is a 75 y.o. female who presents for evaluation for right carotid endarterectomy. She was seen recently on 03/07/2016 by our nurse practitioner. At that time, she had greater than 80% right internal carotid artery stenosis on duplex. She previously underwent left carotid endarterectomy by Dr. Khamari Yousuf in 2015. She denies any amaurosis fugax, sudden onset weakness or numbness in the extremities and slurred speech.  She denies any chest discomfort or shortness of breath. 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 She is on Plavix for this. She is on a beta blocker for hypertension and a statin for hyperlipidemia. She is on oral hypoglycemics for diabetes.  She has never been a smoker.  Past Medical History:  Diagnosis Date  . Anxiety    takes Celexa daily  . Back pain    occasionally  . Carotid artery occlusion   . Cataract    left and immature  . Coronary artery disease   . Diabetes mellitus    takes Metformin and Glipizide daily  . Dizziness    takes Meclizine daily as needed  . GERD (gastroesophageal reflux disease)    takes Omeprazole daily as needed  . Headache(784.0)   . Hyperlipidemia    takes Atorvastatin daily  . Hypertension    takes Carvedilol daily  . Muscle spasm    takes Robaxin daily as needed  . Nausea    takes Phenergan daily as needed  . Pneumonia    hx of-in high school  . Restless leg    takes Requip daily as needed  . Seasonal allergies    takes Claritin daily as needed and Afrin as needed  . Shortness of breath    with exertion  . Urinary urgency     Family History  Problem Relation Age of Onset  . Heart attack Father   . Heart disease Father 45    Before age of 60  . Hyperlipidemia Father   . Heart disease  Brother     Heart dissease before age 60  . Hyperlipidemia Brother   . Heart attack Paternal Grandfather   . Heart disease Paternal Grandfather     SOCIAL HISTORY: Social History  Substance Use Topics  . Smoking status: Never Smoker  . Smokeless tobacco: Never Used  . Alcohol use No    Allergies  Allergen Reactions  . Codeine Other (See Comments)    Abnormal behavior  . Latex Other (See Comments)    tears skin  . Morphine And Related Other (See Comments)    Affects BP and blood sugar.    Current Outpatient Prescriptions  Medication Sig Dispense Refill  . acetaminophen (TYLENOL) 500 MG tablet Take 1,000 mg by mouth every 6 (six) hours as needed for moderate pain. For pain    . Ascorbic Acid (VITAMIN C) 1000 MG tablet Take 1,000 mg by mouth daily.    . aspirin EC 81 MG tablet Take 81 mg by mouth daily.    . atorvastatin (LIPITOR) 40 MG tablet Take 40 mg by mouth daily.     . Calcium Carbonate-Vitamin D (CALTRATE 600+D PO) Take 1 tablet by mouth daily.    . carvedilol (COREG) 25 MG tablet TAKE 1 TABLET BY MOUTH 2  TIMES DAILY WITH A MEAL 180 tablet 3  .   citalopram (CELEXA) 20 MG tablet Take 20 mg by mouth every morning.     . clopidogrel (PLAVIX) 75 MG tablet Take 75 mg by mouth every morning.     . diphenhydrAMINE (BENADRYL) 25 MG tablet Take 25 mg by mouth every 6 (six) hours as needed for itching or allergies.    . fish oil-omega-3 fatty acids 1000 MG capsule Take 1 g by mouth daily.    . gabapentin (NEURONTIN) 300 MG capsule     . glipiZIDE (GLUCOTROL XL) 10 MG 24 hr tablet Take 10 mg by mouth daily.    . HYDROcodone-acetaminophen (NORCO) 10-325 MG tablet     . ibuprofen (ADVIL,MOTRIN) 200 MG tablet Take 800 mg by mouth every 6 (six) hours as needed for pain.     . lisinopril (PRINIVIL,ZESTRIL) 20 MG tablet TAKE 1 TABLET BY MOUTH  DAILY. KEEP OFFICE VISIT. 90 tablet 3  . meclizine (ANTIVERT) 25 MG tablet Take 25 mg by mouth 3 (three) times daily as needed. For dizziness      . metFORMIN (GLUCOPHAGE) 1000 MG tablet Take 1,000 mg by mouth 2 (two) times daily with a meal.     . methocarbamol (ROBAXIN) 500 MG tablet Take 500 mg by mouth every 8 (eight) hours as needed for muscle spasms.    . nitroGLYCERIN (NITROSTAT) 0.4 MG SL tablet Place 1 tablet (0.4 mg total) under the tongue every 5 (five) minutes as needed. Chest pain. 25 tablet 5  . pantoprazole (PROTONIX) 40 MG tablet Take 40 mg by mouth daily.     . promethazine (PHENERGAN) 12.5 MG tablet Take 12.5-25 mg by mouth every 8 (eight) hours as needed. nausea     No current facility-administered medications for this visit.     REVIEW OF SYSTEMS:  [X] denotes positive finding, [ ] denotes negative finding Cardiac  Comments:  Chest pain or chest pressure:    Shortness of breath upon exertion:    Short of breath when lying flat:    Irregular heart rhythm:        Vascular    Pain in calf, thigh, or hip brought on by ambulation:    Pain in feet at night that wakes you up from your sleep:     Blood clot in your veins:    Leg swelling:         Pulmonary    Oxygen at home:    Productive cough:     Wheezing:         Neurologic    Sudden weakness in arms or legs:     Sudden numbness in arms or legs:     Sudden onset of difficulty speaking or slurred speech:    Temporary loss of vision in one eye:     Problems with dizziness:         Gastrointestinal    Blood in stool:     Vomited blood:         Genitourinary    Burning when urinating:     Blood in urine:        Psychiatric    Major depression:         Hematologic    Bleeding problems:    Problems with blood clotting too easily:        Skin    Rashes or ulcers:        Constitutional    Fever or chills:      PHYSICAL EXAM: Vitals:   03/12/16 1524 03/12/16 1528    BP: (!) 144/77 (!) 155/81  Pulse: 61   Resp: 16   Temp: 98.7 F (37.1 C)   TempSrc: Oral   SpO2: 97%   Weight: 174 lb (78.9 kg)   Height: 5' 5" (1.651 m)     GENERAL: The  patient is a well-nourished female, in no acute distress. The vital signs are documented above. CARDIAC: There is a regular rate and rhythm. Right carotid bruit. VASCULAR: 2+ radial pulses bilaterally, equal and symmetric. PULMONARY: There is good air exchange bilaterally without wheezing or rales. MUSCULOSKELETAL: There are no major deformities or cyanosis. NEUROLOGIC: No focal deficits. SKIN: There are no ulcers or rashes noted. PSYCHIATRIC: The patient has a normal affect.  DATA:  Carotid duplex 03/07/2016  Right: 80-99% right proximal internal carotid artery stenosis Patent left carotid and arterectomy site with some hyperplasia with velocities suggestive of 40-59% stenosis Vertebral arteries patent and antegrade  MEDICAL ISSUES: Asymptomatic high grade right internal carotid artery stenosis  Recommended right carotid endarterectomy for stroke prevention. Discussed that stenosis greater than 80% carries an annual 5% risk of stroke. Discussed the stroke rate associated with carotid endarterectomy is around 1%. The patient is willing to proceed. Plan for right carotid endarterectomy at the patient's convenience. She will stop Plavix prior to the procedure.   Kimberly Trinh, PA-C [facility] [place]  Clinic MD: Rodert Hinch  I have examined the patient, reviewed and agree with above. Asymptomatic carotid disease. Have recommended right endarterectomy for reduction of stroke risk. She did quite well with her left carotid surgery.  Qiana Landgrebe, MD 03/12/2016 4:38 PM  

## 2016-03-19 ENCOUNTER — Encounter (HOSPITAL_COMMUNITY)
Admission: RE | Admit: 2016-03-19 | Discharge: 2016-03-19 | Disposition: A | Discharge status: Home / Self Care | Payer: Medicare Other | Source: Ambulatory Visit | Attending: Vascular Surgery | Admitting: Vascular Surgery

## 2016-03-19 ENCOUNTER — Encounter (HOSPITAL_COMMUNITY): Payer: Self-pay

## 2016-03-19 HISTORY — DX: Depression, unspecified: F32.A

## 2016-03-19 HISTORY — DX: Major depressive disorder, single episode, unspecified: F32.9

## 2016-03-19 LAB — URINALYSIS, ROUTINE W REFLEX MICROSCOPIC
Bilirubin Urine: NEGATIVE
Glucose, UA: >=500 mg/dL — AB
Hgb urine dipstick: NEGATIVE
Ketones, ur: NEGATIVE mg/dL
Leukocytes, UA: NEGATIVE
Nitrite: NEGATIVE
Protein, ur: NEGATIVE mg/dL
Specific Gravity, Urine: 1.01 (ref 1.005–1.030)
pH: 8 (ref 5.0–8.0)

## 2016-03-19 LAB — COMPREHENSIVE METABOLIC PANEL
ALT: 21 U/L (ref 14–54)
AST: 25 U/L (ref 15–41)
Albumin: 3.6 g/dL (ref 3.5–5.0)
Alkaline Phosphatase: 61 U/L (ref 38–126)
Anion gap: 10 (ref 5–15)
BUN: 25 mg/dL — ABNORMAL HIGH (ref 6–20)
CO2: 27 mmol/L (ref 22–32)
Calcium: 9.3 mg/dL (ref 8.9–10.3)
Chloride: 103 mmol/L (ref 101–111)
Creatinine, Ser: 0.99 mg/dL (ref 0.44–1.00)
GFR calc Af Amer: >60 mL/min (ref >60)
GFR calc non Af Amer: 55 mL/min — ABNORMAL LOW (ref >60)
Glucose, Bld: 326 mg/dL — ABNORMAL HIGH (ref 65–99)
Potassium: 4.9 mmol/L (ref 3.5–5.1)
Sodium: 140 mmol/L (ref 135–145)
Total Bilirubin: 0.4 mg/dL (ref 0.3–1.2)
Total Protein: 6.2 g/dL — ABNORMAL LOW (ref 6.5–8.1)

## 2016-03-19 LAB — CBC
HCT: 32.4 % — ABNORMAL LOW (ref 36.0–46.0)
Hemoglobin: 10.6 g/dL — ABNORMAL LOW (ref 12.0–15.0)
MCH: 32.5 pg (ref 26.0–34.0)
MCHC: 32.7 g/dL (ref 30.0–36.0)
MCV: 99.4 fL (ref 78.0–100.0)
Platelets: 176 10*3/uL (ref 150–400)
RBC: 3.26 MIL/uL — ABNORMAL LOW (ref 3.87–5.11)
RDW: 12.7 % (ref 11.5–15.5)
WBC: 5.2 10*3/uL (ref 4.0–10.5)

## 2016-03-19 LAB — SURGICAL PCR SCREEN
MRSA, PCR: POSITIVE — AB
Staphylococcus aureus: POSITIVE — AB

## 2016-03-19 LAB — URINALYSIS, MICROSCOPIC (REFLEX)
RBC / HPF: NONE SEEN RBC/hpf (ref 0–5)
Squamous Epithelial / HPF: NONE SEEN

## 2016-03-19 LAB — PROTIME-INR
INR: 1.02
Prothrombin Time: 13.5 seconds (ref 11.4–15.2)

## 2016-03-19 LAB — APTT: aPTT: 26 seconds (ref 24–36)

## 2016-03-19 LAB — TYPE AND SCREEN
ABO/RH(D): A POS
Antibody Screen: NEGATIVE

## 2016-03-19 LAB — GLUCOSE, CAPILLARY: Glucose-Capillary: 322 mg/dL — ABNORMAL HIGH (ref 65–99)

## 2016-03-19 NOTE — Progress Notes (Signed)
Andrea Kirk denies chest pain or shortness of breath.  Patient reports that CBG runs 160- 180, "I check it 2 times a day when I remember."  PCP is Dr Sheryn Bison with [facility], he manages DM.  Patient reports that she last had A1C tested last year, she thinks it was about 7.  CBG on arrival to PAT was 322.

## 2016-03-19 NOTE — Progress Notes (Signed)
I called a prescription for Mupirocin ointment to [employer] [facility] [employer], [place].

## 2016-03-19 NOTE — Pre-Procedure Instructions (Signed)
Andrea Kirk  03/19/2016   Your procedure is scheduled on Friday, March 16.  Report to [facility] Admitting at 7:30 AM               Your surgery or procedure is scheduled for 9:30 AM   Call this number if you have problems the morning of surgery: [phone]    Remember:  Do not eat food or drink liquids after midnight Thursday, March 15.  Take these medicines the morning of surgery with A SIP OF WATER: aspirin, carvedilol (COREG), citalopram (CELEXA), gabapentin (NEURONTIN).                DO NOT take metFORMIN (GLUCOPHAGE)  Or  glipiZIDE (GLUCOTROL XLthe morning of surgery.               Take if needed: promethazine (PHENERGAN), pantoprazole (PROTONIX), methocarbamol (ROBAXIN), HYDROcodone-acetaminophen or acetaminophen (TYLENOL).                 STOP taking Aspirin , Aspirin Products (Goody Powder, Excedrin Migraine), Ibuprofen (Advil), Naproxen (Aleve), Vitamins and Herbal Products (ie Fish Oil)               How to Manage Your Diabetes Before and After Surgery  Why is it important to control my blood sugar before and after surgery? . Improving blood sugar levels before and after surgery helps healing and can limit problems. . A way of improving blood sugar control is eating a healthy diet by: o  Eating less sugar and carbohydrates o  Increasing activity/exercise o  Talking with your doctor about reaching your blood sugar goals . High blood sugars (greater than 180 mg/dL) can raise your risk of infections and slow your recovery, so you will need to focus on controlling your diabetes during the weeks before surgery. . Make sure that the doctor who takes care of your diabetes knows about your planned surgery including the date and location.  How do I manage my blood sugar before surgery? . Check your blood sugar at least 4 times a day, starting 2 days before surgery, to make sure that the level is not too high or low. o Check your blood sugar the morning of your  surgery when you wake up and every 2 hours until you get to the Short Stay unit. . If your blood sugar is less than 70 mg/dL, you will need to treat for low blood sugar: o Do not take insulin. o Treat a low blood sugar (less than 70 mg/dL) with  cup of clear juice (cranberry or apple), 4 glucose tablets, OR glucose gel. o Recheck blood sugar in 15 minutes after treatment (to make sure it is greater than 70 mg/dL). If your blood sugar is not greater than 70 mg/dL on recheck, call [phone] for further instructions. . Report your blood sugar to the short stay nurse when you get to Short Stay.  . If you are admitted to the hospital after surgery: o Your blood sugar will be checked by the staff and you will probably be given insulin after surgery (instead of oral diabetes medicines) to make sure you have good blood sugar levels.The goal for blood sugar control after surgery is 80-180 mg/dL.    WHAT DO I DO ABOUT MY DIABETES MEDICATION? Marland Kitchen Do not take oral diabetes medicines (pills) the morning of surgery.      Do not wear jewelry, make-up or nail polish.  Do not wear lotions, powders, or  perfumes, or deodorant.  Do not shave 48 hours prior to surgery.  Men may shave face and neck.  Do not bring valuables to the hospital.  [facility] is not responsible for any belongings or valuables.  Contacts, dentures or bridgework may not be worn into surgery.  Leave your suitcase in the car.  After surgery it may be brought to your room.  For patients admitted to the hospital, discharge time will be determined by your treatment team.  Please read over the following fact sheets that you were given. [facility]- Preparing For Surgery and Patient Instructions for Mupirocin Application,COughing and Deep Breathing, Pain Booklet, Surgical Site Infections.

## 2016-03-20 LAB — HEMOGLOBIN A1C
Hgb A1c MFr Bld: 8.3 % — ABNORMAL HIGH (ref 4.8–5.6)
Mean Plasma Glucose: 192 mg/dL

## 2016-03-20 NOTE — Progress Notes (Signed)
Anesthesia chart review: Patient is a 76 year old female scheduled for right carotid endarterectomy on 03/22/16 by Dr. Curt Jews.   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   PCP is Dr. Aura Dials with [facility].  Cardiologist is Dr. Irish Lack, last visit 05/02/15. She was working at [employer] 4-5 hours at a time due to leg problems. She was asymptomatic of her CAD. One year follow-up recommended. She denied chest pain and SOB at PAT.   Meds include aspirin 81 mg, Lipitor, Coreg, Celexa, Plavix, fish oil, Neurontin, glipizide, Norco, lisinopril, magnesium, meclizine, metformin, Robaxin, nitroglycerin, Protonix, promethazine. [facility] instructed her to hold Plavix 5 days prior to procedure.   BP (!) 180/79   Pulse 69   Temp 36.8 C   Resp 18   Ht 5\' 5"  (1.651 m)   Wt 174 lb 12.8 oz (79.3 kg)   SpO2 96%   BMI 29.09 kg/m  Unfortunately, BP was not rechecked at PAT. Previously  BP 155/81, 144/77, 102/74, and 133/68 at [facility] this month.  EKG 05/02/15: NSR, non-specific ST/T wave abnormality. Isolated Q wave in III (old).  Nuclear stress test 05/09/10: Impression: Normal myocardial perfusion study.  Post-stress EF 64%. Low risk scan.  Carotid duplex 03/07/16: Impression: 1. 80-99% right proximal ICA stenosis. 2. Patent left carotid endarterectomy site with hyperplasia and velocities in the 40-59% stenosis range at distal Patch site.  Preoperative labs noted. Cr 0.99. H/H 10.6/32.4. Glucose 326. A1c 8.3, consistent with average glucose of 192. She reported home CBGs run 160-180.   Staff message sent to Dr. Donnetta Hutching at [facility] regarding PAT BP, glucose, and A1c result. If he is comfortable proceeding as planned then patient will get vitals and fasting CBG on arrival. As long as results felt  acceptable and otherwise no new changes or CV symptoms then would plan to proceed. Discussed with anesthesiologist Dr. Gifford Shave.  [facility] Short Stay Center/Anesthesiology Phone [phone] 03/20/2016 12:43 PM

## 2016-03-22 ENCOUNTER — Encounter (HOSPITAL_COMMUNITY)
Admission: RE | Disposition: A | Discharge status: Home / Self Care | Payer: Self-pay | Source: Ambulatory Visit | Attending: Vascular Surgery

## 2016-03-22 ENCOUNTER — Inpatient Hospital Stay (HOSPITAL_COMMUNITY)
Admission: RE | Admit: 2016-03-22 | Discharge: 2016-03-23 | DRG: 039 | Disposition: A | Discharge status: Home / Self Care | Payer: Medicare Other | Source: Ambulatory Visit | Attending: Vascular Surgery | Admitting: Vascular Surgery

## 2016-03-22 ENCOUNTER — Inpatient Hospital Stay (HOSPITAL_COMMUNITY): Payer: Medicare Other | Admitting: Vascular Surgery

## 2016-03-22 ENCOUNTER — Inpatient Hospital Stay (HOSPITAL_COMMUNITY): Payer: Medicare Other | Admitting: Certified Registered Nurse Anesthetist

## 2016-03-22 ENCOUNTER — Encounter (HOSPITAL_COMMUNITY): Payer: Self-pay | Admitting: *Deleted

## 2016-03-22 DIAGNOSIS — I10 Essential (primary) hypertension: Secondary | ICD-10-CM | POA: Diagnosis present

## 2016-03-22 DIAGNOSIS — E785 Hyperlipidemia, unspecified: Secondary | ICD-10-CM | POA: Diagnosis present

## 2016-03-22 DIAGNOSIS — E1151 Type 2 diabetes mellitus with diabetic peripheral angiopathy without gangrene: Secondary | ICD-10-CM | POA: Diagnosis present

## 2016-03-22 DIAGNOSIS — J302 Other seasonal allergic rhinitis: Secondary | ICD-10-CM | POA: Diagnosis present

## 2016-03-22 DIAGNOSIS — Z7982 Long term (current) use of aspirin: Secondary | ICD-10-CM | POA: Diagnosis not present

## 2016-03-22 DIAGNOSIS — Z7902 Long term (current) use of antithrombotics/antiplatelets: Secondary | ICD-10-CM | POA: Diagnosis not present

## 2016-03-22 DIAGNOSIS — Z7984 Long term (current) use of oral hypoglycemic drugs: Secondary | ICD-10-CM

## 2016-03-22 DIAGNOSIS — I2510 Atherosclerotic heart disease of native coronary artery without angina pectoris: Secondary | ICD-10-CM | POA: Diagnosis present

## 2016-03-22 DIAGNOSIS — M62838 Other muscle spasm: Secondary | ICD-10-CM | POA: Diagnosis present

## 2016-03-22 DIAGNOSIS — Z95828 Presence of other vascular implants and grafts: Secondary | ICD-10-CM

## 2016-03-22 DIAGNOSIS — Z91040 Latex allergy status: Secondary | ICD-10-CM

## 2016-03-22 DIAGNOSIS — Z951 Presence of aortocoronary bypass graft: Secondary | ICD-10-CM | POA: Diagnosis not present

## 2016-03-22 DIAGNOSIS — Z79899 Other long term (current) drug therapy: Secondary | ICD-10-CM

## 2016-03-22 DIAGNOSIS — Z885 Allergy status to narcotic agent status: Secondary | ICD-10-CM

## 2016-03-22 DIAGNOSIS — F419 Anxiety disorder, unspecified: Secondary | ICD-10-CM | POA: Diagnosis present

## 2016-03-22 DIAGNOSIS — K219 Gastro-esophageal reflux disease without esophagitis: Secondary | ICD-10-CM | POA: Diagnosis present

## 2016-03-22 DIAGNOSIS — G2581 Restless legs syndrome: Secondary | ICD-10-CM | POA: Diagnosis present

## 2016-03-22 DIAGNOSIS — I6521 Occlusion and stenosis of right carotid artery: Secondary | ICD-10-CM | POA: Diagnosis not present

## 2016-03-22 DIAGNOSIS — Z8249 Family history of ischemic heart disease and other diseases of the circulatory system: Secondary | ICD-10-CM | POA: Diagnosis not present

## 2016-03-22 HISTORY — PX: PATCH ANGIOPLASTY: SHX6230

## 2016-03-22 HISTORY — PX: ENDARTERECTOMY: SHX5162

## 2016-03-22 HISTORY — DX: Occlusion and stenosis of right carotid artery: I65.21

## 2016-03-22 LAB — GLUCOSE, CAPILLARY
Glucose-Capillary: 157 mg/dL — ABNORMAL HIGH (ref 65–99)
Glucose-Capillary: 168 mg/dL — ABNORMAL HIGH (ref 65–99)
Glucose-Capillary: 204 mg/dL — ABNORMAL HIGH (ref 65–99)
Glucose-Capillary: 155 mg/dL — ABNORMAL HIGH (ref 65–99)

## 2016-03-22 SURGERY — ENDARTERECTOMY, CAROTID
Anesthesia: General | Site: Neck | Laterality: Right

## 2016-03-22 MED ORDER — DEXTROSE 5 % IV SOLN
1.5000 g | INTRAVENOUS | Status: AC
  Administered 2016-03-22: 1.5 g via INTRAVENOUS

## 2016-03-22 MED ORDER — OMEGA-3-ACID ETHYL ESTERS 1 G PO CAPS
1.0000 g | ORAL_CAPSULE | Freq: Every day | ORAL | Status: DC
  Administered 2016-03-23: 1 g via ORAL
  Filled 2016-03-22: qty 1

## 2016-03-22 MED ORDER — HYDROMORPHONE HCL 1 MG/ML IJ SOLN
0.2500 mg | INTRAMUSCULAR | Status: DC | PRN
  Administered 2016-03-22 (×3): 0.5 mg via INTRAVENOUS

## 2016-03-22 MED ORDER — CITALOPRAM HYDROBROMIDE 20 MG PO TABS
20.0000 mg | ORAL_TABLET | Freq: Every morning | ORAL | Status: DC
  Administered 2016-03-23: 20 mg via ORAL
  Filled 2016-03-22: qty 1

## 2016-03-22 MED ORDER — LABETALOL HCL 5 MG/ML IV SOLN
INTRAVENOUS | Status: AC
  Filled 2016-03-22: qty 4

## 2016-03-22 MED ORDER — DOCUSATE SODIUM 100 MG PO CAPS
100.0000 mg | ORAL_CAPSULE | Freq: Every day | ORAL | Status: DC
  Administered 2016-03-23: 100 mg via ORAL
  Filled 2016-03-22: qty 1

## 2016-03-22 MED ORDER — OMEGA-3 FATTY ACIDS 1000 MG PO CAPS: 1.0000 g | ORAL_CAPSULE | Freq: Every day | ORAL | Status: DC

## 2016-03-22 MED ORDER — PROTAMINE SULFATE 10 MG/ML IV SOLN
INTRAVENOUS | Status: AC
  Filled 2016-03-22: qty 5

## 2016-03-22 MED ORDER — HYDROMORPHONE HCL 1 MG/ML IJ SOLN
INTRAMUSCULAR | Status: AC
  Filled 2016-03-22: qty 1

## 2016-03-22 MED ORDER — CARVEDILOL 25 MG PO TABS
25.0000 mg | ORAL_TABLET | Freq: Two times a day (BID) | ORAL | Status: DC
  Administered 2016-03-22 – 2016-03-23 (×2): 25 mg via ORAL
  Filled 2016-03-22 (×2): qty 1

## 2016-03-22 MED ORDER — ATORVASTATIN CALCIUM 40 MG PO TABS
40.0000 mg | ORAL_TABLET | Freq: Every day | ORAL | Status: DC
Start: 2016-03-22 — End: 2016-03-23
  Administered 2016-03-22: 40 mg via ORAL
  Filled 2016-03-22: qty 1

## 2016-03-22 MED ORDER — LIDOCAINE 2% (20 MG/ML) 5 ML SYRINGE
INTRAMUSCULAR | Status: AC
  Filled 2016-03-22: qty 5

## 2016-03-22 MED ORDER — PROTAMINE SULFATE 10 MG/ML IV SOLN
INTRAVENOUS | Status: DC | PRN
  Administered 2016-03-22: 25 mg via INTRAVENOUS
  Administered 2016-03-22: 20 mg via INTRAVENOUS
  Administered 2016-03-22: 5 mg via INTRAVENOUS

## 2016-03-22 MED ORDER — CLOPIDOGREL BISULFATE 75 MG PO TABS
75.0000 mg | ORAL_TABLET | Freq: Every morning | ORAL | Status: DC
  Administered 2016-03-23: 75 mg via ORAL
  Filled 2016-03-22: qty 1

## 2016-03-22 MED ORDER — PROPOFOL 10 MG/ML IV BOLUS
INTRAVENOUS | Status: DC | PRN
  Administered 2016-03-22: 180 mg via INTRAVENOUS

## 2016-03-22 MED ORDER — MECLIZINE HCL 25 MG PO TABS
25.0000 mg | ORAL_TABLET | Freq: Two times a day (BID) | ORAL | Status: DC | PRN
  Filled 2016-03-22: qty 1

## 2016-03-22 MED ORDER — GUAIFENESIN-DM 100-10 MG/5ML PO SYRP: 15.0000 mL | ORAL_SOLUTION | ORAL | Status: DC | PRN

## 2016-03-22 MED ORDER — SODIUM CHLORIDE 0.9 % IV SOLN: 500.0000 mL | Freq: Once | INTRAVENOUS | Status: DC | PRN

## 2016-03-22 MED ORDER — SUGAMMADEX SODIUM 200 MG/2ML IV SOLN
INTRAVENOUS | Status: DC | PRN
  Administered 2016-03-22: 160 mg via INTRAVENOUS

## 2016-03-22 MED ORDER — FENTANYL CITRATE (PF) 100 MCG/2ML IJ SOLN
INTRAMUSCULAR | Status: DC | PRN
  Administered 2016-03-22: 50 ug via INTRAVENOUS

## 2016-03-22 MED ORDER — LISINOPRIL 20 MG PO TABS
20.0000 mg | ORAL_TABLET | Freq: Every day | ORAL | Status: DC
  Administered 2016-03-22: 20 mg via ORAL
  Filled 2016-03-22: qty 1

## 2016-03-22 MED ORDER — SALINE SPRAY 0.65 % NA SOLN
1.0000 | NASAL | Status: DC | PRN
  Administered 2016-03-22: 1 via NASAL
  Filled 2016-03-22: qty 44

## 2016-03-22 MED ORDER — VITAMIN B-12 1000 MCG PO TABS
2500.0000 ug | ORAL_TABLET | Freq: Every day | ORAL | Status: DC
  Administered 2016-03-23: 2500 ug via ORAL
  Filled 2016-03-22: qty 3

## 2016-03-22 MED ORDER — PHENOL 1.4 % MT LIQD: 1.0000 | OROMUCOSAL | Status: DC | PRN

## 2016-03-22 MED ORDER — EPHEDRINE 5 MG/ML INJ
INTRAVENOUS | Status: AC
  Filled 2016-03-22: qty 10

## 2016-03-22 MED ORDER — HYDROMORPHONE HCL 1 MG/ML IJ SOLN
0.5000 mg | INTRAMUSCULAR | Status: DC | PRN
  Administered 2016-03-22: 1 mg via INTRAVENOUS
  Filled 2016-03-22: qty 1

## 2016-03-22 MED ORDER — ROCURONIUM BROMIDE 50 MG/5ML IV SOSY
PREFILLED_SYRINGE | INTRAVENOUS | Status: AC
  Filled 2016-03-22: qty 5

## 2016-03-22 MED ORDER — SODIUM CHLORIDE 0.9 % IV SOLN
INTRAVENOUS | Status: DC | PRN
  Administered 2016-03-22: 500 mL

## 2016-03-22 MED ORDER — HEPARIN SODIUM (PORCINE) 1000 UNIT/ML IJ SOLN
INTRAMUSCULAR | Status: AC
  Filled 2016-03-22: qty 1

## 2016-03-22 MED ORDER — ASPIRIN EC 81 MG PO TBEC
81.0000 mg | DELAYED_RELEASE_TABLET | Freq: Every evening | ORAL | Status: DC
  Administered 2016-03-22: 81 mg via ORAL
  Filled 2016-03-22: qty 1

## 2016-03-22 MED ORDER — HYDRALAZINE HCL 20 MG/ML IJ SOLN: 5.0000 mg | INTRAMUSCULAR | Status: DC | PRN

## 2016-03-22 MED ORDER — SODIUM CHLORIDE 0.9 % IV SOLN
0.0125 ug/kg/min | INTRAVENOUS | Status: AC
  Administered 2016-03-22: .07 ug/kg/min via INTRAVENOUS
  Filled 2016-03-22: qty 2000

## 2016-03-22 MED ORDER — PROPOFOL 10 MG/ML IV BOLUS
INTRAVENOUS | Status: AC
  Filled 2016-03-22: qty 20

## 2016-03-22 MED ORDER — METOPROLOL TARTRATE 5 MG/5ML IV SOLN: 2.0000 mg | INTRAVENOUS | Status: DC | PRN

## 2016-03-22 MED ORDER — HYDRALAZINE HCL 20 MG/ML IJ SOLN
5.0000 mg | Freq: Once | INTRAMUSCULAR | Status: AC
  Administered 2016-03-22: 5 mg via INTRAVENOUS

## 2016-03-22 MED ORDER — LIDOCAINE HCL (PF) 1 % IJ SOLN
INTRAMUSCULAR | Status: AC
  Filled 2016-03-22: qty 30

## 2016-03-22 MED ORDER — MAGNESIUM SULFATE 2 GM/50ML IV SOLN: 2.0000 g | Freq: Every day | INTRAVENOUS | Status: DC | PRN

## 2016-03-22 MED ORDER — ONDANSETRON HCL 4 MG/2ML IJ SOLN
INTRAMUSCULAR | Status: AC
  Filled 2016-03-22: qty 2

## 2016-03-22 MED ORDER — DEXTROSE 5 % IV SOLN
INTRAVENOUS | Status: AC
  Filled 2016-03-22: qty 1.5

## 2016-03-22 MED ORDER — GABAPENTIN 300 MG PO CAPS
300.0000 mg | ORAL_CAPSULE | Freq: Four times a day (QID) | ORAL | Status: DC
  Administered 2016-03-22 – 2016-03-23 (×3): 300 mg via ORAL
  Filled 2016-03-22 (×3): qty 1

## 2016-03-22 MED ORDER — ALUM & MAG HYDROXIDE-SIMETH 200-200-20 MG/5ML PO SUSP
15.0000 mL | ORAL | Status: DC | PRN
Start: 2016-03-22 — End: 2016-03-23

## 2016-03-22 MED ORDER — HYDROCODONE-ACETAMINOPHEN 10-325 MG PO TABS: 1.0000 | ORAL_TABLET | Freq: Two times a day (BID) | ORAL | 0 refills | Status: DC | PRN

## 2016-03-22 MED ORDER — SODIUM CHLORIDE 0.9 % IV SOLN
INTRAVENOUS | Status: DC
  Administered 2016-03-22: 13:00:00 via INTRAVENOUS

## 2016-03-22 MED ORDER — PHENYLEPHRINE 40 MCG/ML (10ML) SYRINGE FOR IV PUSH (FOR BLOOD PRESSURE SUPPORT)
PREFILLED_SYRINGE | INTRAVENOUS | Status: AC
  Filled 2016-03-22: qty 10

## 2016-03-22 MED ORDER — METHOCARBAMOL 500 MG PO TABS
500.0000 mg | ORAL_TABLET | Freq: Three times a day (TID) | ORAL | Status: DC | PRN
  Administered 2016-03-23: 500 mg via ORAL
  Filled 2016-03-22: qty 1

## 2016-03-22 MED ORDER — ONDANSETRON HCL 4 MG/2ML IJ SOLN: 4.0000 mg | Freq: Four times a day (QID) | INTRAMUSCULAR | Status: DC | PRN

## 2016-03-22 MED ORDER — GLIPIZIDE ER 10 MG PO TB24
10.0000 mg | ORAL_TABLET | Freq: Every day | ORAL | Status: DC
  Administered 2016-03-23: 10 mg via ORAL
  Filled 2016-03-22: qty 1

## 2016-03-22 MED ORDER — LABETALOL HCL 5 MG/ML IV SOLN
INTRAVENOUS | Status: DC | PRN
  Administered 2016-03-22: 10 mg via INTRAVENOUS
  Administered 2016-03-22 (×3): 5 mg via INTRAVENOUS
  Administered 2016-03-22: 10 mg via INTRAVENOUS

## 2016-03-22 MED ORDER — ROCURONIUM BROMIDE 100 MG/10ML IV SOLN
INTRAVENOUS | Status: DC | PRN
  Administered 2016-03-22: 30 mg via INTRAVENOUS

## 2016-03-22 MED ORDER — ACETAMINOPHEN 500 MG PO TABS
1000.0000 mg | ORAL_TABLET | Freq: Four times a day (QID) | ORAL | Status: DC | PRN
  Administered 2016-03-23: 1000 mg via ORAL
  Filled 2016-03-22: qty 2

## 2016-03-22 MED ORDER — DEXTROSE 5 % IV SOLN
1.5000 g | Freq: Two times a day (BID) | INTRAVENOUS | Status: AC
  Administered 2016-03-22 – 2016-03-23 (×2): 1.5 g via INTRAVENOUS
  Filled 2016-03-22 (×2): qty 1.5

## 2016-03-22 MED ORDER — NITROGLYCERIN 0.4 MG SL SUBL: 0.4000 mg | SUBLINGUAL_TABLET | SUBLINGUAL | Status: DC | PRN

## 2016-03-22 MED ORDER — 0.9 % SODIUM CHLORIDE (POUR BTL) OPTIME
TOPICAL | Status: DC | PRN
  Administered 2016-03-22: 1000 mL

## 2016-03-22 MED ORDER — HEPARIN SODIUM (PORCINE) 1000 UNIT/ML IJ SOLN
INTRAMUSCULAR | Status: DC | PRN
  Administered 2016-03-22: 8000 [IU] via INTRAVENOUS

## 2016-03-22 MED ORDER — CHLORHEXIDINE GLUCONATE CLOTH 2 % EX PADS
6.0000 | MEDICATED_PAD | Freq: Once | CUTANEOUS | Status: DC
  Administered 2016-03-22: 6 via TOPICAL

## 2016-03-22 MED ORDER — PANTOPRAZOLE SODIUM 40 MG PO TBEC
40.0000 mg | DELAYED_RELEASE_TABLET | Freq: Every day | ORAL | Status: DC
  Administered 2016-03-22 – 2016-03-23 (×2): 40 mg via ORAL
  Filled 2016-03-22 (×2): qty 1

## 2016-03-22 MED ORDER — INSULIN ASPART 100 UNIT/ML ~~LOC~~ SOLN
0.0000 [IU] | Freq: Three times a day (TID) | SUBCUTANEOUS | Status: DC
  Administered 2016-03-22: 5 [IU] via SUBCUTANEOUS
  Administered 2016-03-23: 3 [IU] via SUBCUTANEOUS

## 2016-03-22 MED ORDER — PROMETHAZINE HCL 25 MG PO TABS: 12.5000 mg | ORAL_TABLET | Freq: Three times a day (TID) | ORAL | Status: DC | PRN

## 2016-03-22 MED ORDER — LACTATED RINGERS IV SOLN
INTRAVENOUS | Status: DC
  Administered 2016-03-22: 09:00:00 via INTRAVENOUS
  Administered 2016-03-22: 50 mL/h via INTRAVENOUS

## 2016-03-22 MED ORDER — HYDRALAZINE HCL 20 MG/ML IJ SOLN
INTRAMUSCULAR | Status: AC
  Filled 2016-03-22: qty 1

## 2016-03-22 MED ORDER — ONDANSETRON HCL 4 MG/2ML IJ SOLN
INTRAMUSCULAR | Status: DC | PRN
  Administered 2016-03-22: 4 mg via INTRAVENOUS

## 2016-03-22 MED ORDER — CHLORHEXIDINE GLUCONATE CLOTH 2 % EX PADS
6.0000 | MEDICATED_PAD | Freq: Every day | CUTANEOUS | Status: DC
  Administered 2016-03-23: 6 via TOPICAL

## 2016-03-22 MED ORDER — MUPIROCIN 2 % EX OINT
1.0000 "application " | TOPICAL_OINTMENT | Freq: Two times a day (BID) | CUTANEOUS | Status: DC
  Administered 2016-03-22 – 2016-03-23 (×2): 1 via NASAL
  Filled 2016-03-22: qty 22

## 2016-03-22 MED ORDER — PROMETHAZINE HCL 25 MG/ML IJ SOLN: 6.2500 mg | INTRAMUSCULAR | Status: DC | PRN

## 2016-03-22 MED ORDER — HYDROCODONE-ACETAMINOPHEN 10-325 MG PO TABS
1.0000 | ORAL_TABLET | ORAL | Status: DC | PRN
  Administered 2016-03-23: 2 via ORAL
  Filled 2016-03-22: qty 2

## 2016-03-22 MED ORDER — PANTOPRAZOLE SODIUM 40 MG PO TBEC: 40.0000 mg | DELAYED_RELEASE_TABLET | Freq: Every day | ORAL | Status: DC | PRN

## 2016-03-22 MED ORDER — EPHEDRINE SULFATE-NACL 50-0.9 MG/10ML-% IV SOSY
PREFILLED_SYRINGE | INTRAVENOUS | Status: DC | PRN
Start: 2016-03-22 — End: 2016-03-22
  Administered 2016-03-22 (×2): 5 mg via INTRAVENOUS
  Administered 2016-03-22 (×2): 10 mg via INTRAVENOUS
  Administered 2016-03-22 (×2): 5 mg via INTRAVENOUS

## 2016-03-22 MED ORDER — CHLORHEXIDINE GLUCONATE CLOTH 2 % EX PADS: 6.0000 | MEDICATED_PAD | Freq: Once | CUTANEOUS | Status: DC

## 2016-03-22 MED ORDER — SUCCINYLCHOLINE CHLORIDE 200 MG/10ML IV SOSY
PREFILLED_SYRINGE | INTRAVENOUS | Status: DC | PRN
  Administered 2016-03-22: 100 mg via INTRAVENOUS

## 2016-03-22 MED ORDER — FENTANYL CITRATE (PF) 100 MCG/2ML IJ SOLN
INTRAMUSCULAR | Status: AC
  Filled 2016-03-22: qty 2

## 2016-03-22 MED ORDER — PHENYLEPHRINE 40 MCG/ML (10ML) SYRINGE FOR IV PUSH (FOR BLOOD PRESSURE SUPPORT)
PREFILLED_SYRINGE | INTRAVENOUS | Status: DC | PRN
  Administered 2016-03-22 (×2): 80 ug via INTRAVENOUS

## 2016-03-22 MED ORDER — LIDOCAINE 2% (20 MG/ML) 5 ML SYRINGE
INTRAMUSCULAR | Status: DC | PRN
  Administered 2016-03-22: 75 mg via INTRAVENOUS

## 2016-03-22 MED ORDER — METFORMIN HCL 500 MG PO TABS
1000.0000 mg | ORAL_TABLET | Freq: Two times a day (BID) | ORAL | Status: DC
  Administered 2016-03-22 – 2016-03-23 (×2): 1000 mg via ORAL
  Filled 2016-03-22 (×2): qty 2

## 2016-03-22 MED ORDER — SODIUM CHLORIDE 0.9 % IV SOLN: INTRAVENOUS | Status: DC

## 2016-03-22 MED ORDER — LABETALOL HCL 5 MG/ML IV SOLN
10.0000 mg | INTRAVENOUS | Status: DC | PRN
  Administered 2016-03-22: 10 mg via INTRAVENOUS
  Filled 2016-03-22 (×2): qty 4

## 2016-03-22 MED ORDER — SUGAMMADEX SODIUM 200 MG/2ML IV SOLN
INTRAVENOUS | Status: AC
  Filled 2016-03-22: qty 2

## 2016-03-22 MED ORDER — POTASSIUM CHLORIDE CRYS ER 20 MEQ PO TBCR: 20.0000 meq | EXTENDED_RELEASE_TABLET | Freq: Every day | ORAL | Status: DC | PRN

## 2016-03-22 SURGICAL SUPPLY — 35 items
CANISTER SUCT 3000ML PPV (MISCELLANEOUS) ×2 IMPLANT
CANNULA VESSEL 3MM 2 BLNT TIP (CANNULA) ×4 IMPLANT
CATH ROBINSON RED A/P 18FR (CATHETERS) ×2 IMPLANT
CLIP LIGATING EXTRA MED SLVR (CLIP) ×2 IMPLANT
CLIP LIGATING EXTRA SM BLUE (MISCELLANEOUS) ×2 IMPLANT
CRADLE DONUT ADULT HEAD (MISCELLANEOUS) ×2 IMPLANT
DECANTER SPIKE VIAL GLASS SM (MISCELLANEOUS) IMPLANT
DERMABOND ADVANCED (GAUZE/BANDAGES/DRESSINGS) ×1
DERMABOND ADVANCED .7 DNX12 (GAUZE/BANDAGES/DRESSINGS) ×1 IMPLANT
DRAIN HEMOVAC 1/8 X 5 (WOUND CARE) IMPLANT
ELECT REM PT RETURN 9FT ADLT (ELECTROSURGICAL) ×2
ELECTRODE REM PT RTRN 9FT ADLT (ELECTROSURGICAL) ×1 IMPLANT
EVACUATOR SILICONE 100CC (DRAIN) IMPLANT
GLOVE SS BIOGEL STRL SZ 7.5 (GLOVE) ×1 IMPLANT
GLOVE SUPERSENSE BIOGEL SZ 7.5 (GLOVE) ×1
GOWN STRL REUS W/ TWL LRG LVL3 (GOWN DISPOSABLE) ×4 IMPLANT
GOWN STRL REUS W/TWL LRG LVL3 (GOWN DISPOSABLE) ×4
KIT BASIN OR (CUSTOM PROCEDURE TRAY) ×2 IMPLANT
KIT ROOM TURNOVER OR (KITS) ×2 IMPLANT
NEEDLE 22X1 1/2 (OR ONLY) (NEEDLE) IMPLANT
NS IRRIG 1000ML POUR BTL (IV SOLUTION) ×4 IMPLANT
PACK CAROTID (CUSTOM PROCEDURE TRAY) ×2 IMPLANT
PAD ARMBOARD 7.5X6 YLW CONV (MISCELLANEOUS) ×4 IMPLANT
PATCH HEMASHIELD 8X75 (Vascular Products) ×2 IMPLANT
SHUNT CAROTID BYPASS 10 (VASCULAR PRODUCTS) ×2 IMPLANT
SHUNT CAROTID BYPASS 12FRX15.5 (VASCULAR PRODUCTS) IMPLANT
SUT ETHILON 3 0 PS 1 (SUTURE) IMPLANT
SUT PROLENE 6 0 CC (SUTURE) ×10 IMPLANT
SUT SILK 3 0 (SUTURE)
SUT SILK 3-0 18XBRD TIE 12 (SUTURE) IMPLANT
SUT VIC AB 3-0 SH 27 (SUTURE) ×2
SUT VIC AB 3-0 SH 27X BRD (SUTURE) ×2 IMPLANT
SUT VICRYL 4-0 PS2 18IN ABS (SUTURE) ×2 IMPLANT
SYR CONTROL 10ML LL (SYRINGE) IMPLANT
WATER STERILE IRR 1000ML POUR (IV SOLUTION) ×2 IMPLANT

## 2016-03-22 NOTE — Interval H&P Note (Signed)
History and Physical Interval Note:  03/22/2016 7:52 AM  Andrea Kirk  has presented today for surgery, with the diagnosis of Right carotid artery stenosis I65.21  The various methods of treatment have been discussed with the patient and family. After consideration of risks, benefits and other options for treatment, the patient has consented to  Procedure(s): ENDARTERECTOMY CAROTID (Right) as a surgical intervention .  The patient's history has been reviewed, patient examined, no change in status, stable for surgery.  I have reviewed the patient's chart and labs.  Questions were answered to the patient's satisfaction.     Curt Jews

## 2016-03-22 NOTE — Care Management Note (Signed)
[facility] Management Note  Patient Details  Name: Teletha Petrea Shuping MRN: [id number] Date of Birth: 03-19-1940  Subjective/Objective:  s/p R CEA, from home, NCM will cont to follow for dc needs.                  Action/Plan:   Expected Discharge Date:                  Expected Discharge Plan:  Home/Self Care  In-House Referral:     Discharge planning Services  CM Consult  Post Acute Care Choice:    Choice offered to:     DME Arranged:    DME Agency:     HH Arranged:    HH Agency:     Status of Service:  In process, will continue to follow  If discussed at Long Length of Stay Meetings, dates discussed:    Additional Comments:  Zenon Mayo, RN 03/22/2016, 3:07 PM

## 2016-03-22 NOTE — Anesthesia Procedure Notes (Signed)
Procedure Name: Intubation Date/Time: 03/22/2016 9:40 AM Performed by: Trixie Deis A Pre-anesthesia Checklist: Patient identified, Emergency Drugs available, Suction available and Patient being monitored Patient Re-evaluated:Patient Re-evaluated prior to inductionOxygen Delivery Method: Circle System Utilized Preoxygenation: Pre-oxygenation with 100% oxygen Intubation Type: IV induction Ventilation: Mask ventilation without difficulty Laryngoscope Size: Mac and 3 Grade View: Grade I Tube type: Oral Tube size: 7.0 mm Number of attempts: 1 Airway Equipment and Method: Stylet and Oral airway Placement Confirmation: ETT inserted through vocal cords under direct vision,  positive ETCO2 and breath sounds checked- equal and bilateral Secured at: 21 cm Tube secured with: Tape Dental Injury: Teeth and Oropharynx as per pre-operative assessment and Injury to lip  Comments: Performed by Crissie Reese paramedic student under direct supervision by Dr. Kalman Shan and Trixie Deis CRNA

## 2016-03-22 NOTE — Progress Notes (Signed)
  [facility] Day of Surgery Note  Subjective:  Right neck incision hurts. Wants to move up in the bed.   Vitals:   03/22/16 1245 03/22/16 1300  BP: (!) 136/59 (!) 132/59  Pulse: 71 70  Resp: 11 13  Temp:  98.1 F (36.7 C)   Right neck incision without hematoma.  Smile symmetric. No tongue deviation. 5/5 strength upper and lower extremities bilaterally.   Assessment/Plan:  This is a 76 y.o. female who is s/p right carotid endarterectomy  Neuro exam intact Neck without hematoma Anticipate d/c tomorrow.    Virgina Jock, [place] Pager: [phone] 03/22/2016 2:31 PM

## 2016-03-22 NOTE — Anesthesia Preprocedure Evaluation (Addendum)
Anesthesia Evaluation  Patient identified by MRN, date of birth, ID band Patient awake    Reviewed: Allergy & Precautions, NPO status , Patient's Chart, lab work & pertinent test results  Airway Mallampati: II  TM Distance: >3 FB Neck ROM: Full    Dental no notable dental hx.    Pulmonary neg pulmonary ROS,    Pulmonary exam normal breath sounds clear to auscultation       Cardiovascular hypertension, + CAD, + CABG and + Peripheral Vascular Disease  Normal cardiovascular exam Rhythm:Regular Rate:Normal     Neuro/Psych negative neurological ROS  negative psych ROS   GI/Hepatic negative GI ROS, Neg liver ROS,   Endo/Other  diabetes  Renal/GU negative Renal ROS  negative genitourinary   Musculoskeletal negative musculoskeletal ROS (+)   Abdominal   Peds negative pediatric ROS (+)  Hematology negative hematology ROS (+)   Anesthesia Other Findings   Reproductive/Obstetrics negative OB ROS                            Anesthesia Physical Anesthesia Plan  ASA: III  Anesthesia Plan: General   Post-op Pain Management:    Induction: Intravenous  Airway Management Planned: Oral ETT  Additional Equipment: Arterial line  Intra-op Plan:   Post-operative Plan: Extubation in OR  Informed Consent: I have reviewed the patients History and Physical, chart, labs and discussed the procedure including the risks, benefits and alternatives for the proposed anesthesia with the patient or authorized representative who has indicated his/her understanding and acceptance.   Dental advisory given  Plan Discussed with: CRNA and Surgeon  Anesthesia Plan Comments:         Anesthesia Quick Evaluation

## 2016-03-22 NOTE — Op Note (Signed)
OPERATIVE REPORT  DATE OF SURGERY: 03/22/2016  PATIENT: Andrea Kirk, 76 y.o. female MRN: [id number]  DOB: 01/27/1940  PRE-OPERATIVE DIAGNOSIS: Severe asymptomatic right internal carotid artery stenosis  POST-OPERATIVE DIAGNOSIS:  Same  PROCEDURE: Right carotid endarterectomy, resection of redundant common carotid artery and Dacron patch angioplasty  SURGEON:  Curt Jews, M.D.  PHYSICIAN ASSISTANT: Pervis Hocking PA-C  ANESTHESIA:  Gen.  EBL: Minimal ml  No intake/output data recorded.  BLOOD ADMINISTERED: None  DRAINS: None  SPECIMEN: None  COUNTS CORRECT:  YES  PLAN OF CARE: PACU neurologically intact   PATIENT DISPOSITION:  PACU - hemodynamically stable  PROCEDURE DETAILS: The patient was taken to the operating placed supine position where the area of the right neck was prepped and draped in usual sterile fashion. An incision was made anterior to the sternocleidomastoid and carried down through the platysma with electrocautery. The sternocleidomastoid was reflected posteriorly and the carotid sheath was opened. The facial vein was ligated with 2-0 silk ties and divided. The common carotid artery was encircled with an umbilical tape and Rummel tourniquet. The vagus and hypoglossal nerves were identified and preserved. Dissection was continued onto the bifurcation. The superior thyroid artery was encircled with a 2-0 silk Potts tie and the external carotid was encircled with a blue vessel loop. The internal carotid artery was encircled with an umbilical tape and Rummel tourniquet. The patient had a very redundant internal carotid artery. There was some redundancy in the external carotid as well. The common carotid and bifurcation where mobilized and was felt that resection of the common carotid artery was required to address the redundancy in the internal carotid artery. Patient was given 8000 units intravenous heparin and after adequate circulation time the internal and external  and common carotid arteries were occluded. Common carotid artery was opened with 11 blade and extended longitudinally with Potts scissors onto the internal carotid artery. A 10 shunt was passed up the internal carotid allowed to back bleed and down the common carotid where were secured with Rummel tourniquet. The endarterectomy was begun on the common carotid artery and the plaque was divided proximally with Potts scissors. This endarterectomy was continued onto the bifurcation. The external carotid was endarterectomized with an eversion technique and the internal carotid was endarterectomized in an open fashion. Remaining atheromatous debris was removed from the endarterectomy plane. Several 60 stay sutures were used to determine the level of resection and approximately 1 cm of common carotid artery was resected. The common carotid artery was sewn into into itself with a running 6-0 Prolene suture. Finally a Finesse Hemashield Dacron patch was brought onto the field and was sewn as a patch angioplasty with a running 6-0 Prolene suture. Prior to completion of the closure the shunt was removed and the usual flushing maneuvers were undertaken. The anastomosis was then completed and flow was restored first to the external and then the internal carotid artery. Excellent flow characteristics were noted with hand-held Doppler in the internal and external carotid arteries. The patient was given 50 mg of protamine to reverse the heparin. Wounds irrigated with saline. Hemostasis was obtained with electrocautery. The wounds were closed with several 3-0 Vicryl sutures to reapproximate sternocleidomastoid over the carotid sheath. Next the platysma was closed with a running 3-0 Vicryl suture and find the skin was closed with a 4-0 subcuticular Vicryl suture. Sterile dressing was applied and the patient was transferred to the recovery room in stable condition   Rosetta Posner, M.D., [facility] 03/22/2016  12:00 PM

## 2016-03-22 NOTE — H&P (View-Only) (Signed)
[facility]  Patient name: Andrea Kirk MRN: [id number] DOB: 10-08-1940 Sex: female  REASON FOR VISIT: discuss right carotid endarterectomy  HPI: Andrea Kirk is a 76 y.o. female who presents for evaluation for right carotid endarterectomy. She was seen recently on 03/07/2016 by our nurse practitioner. At that time, she had greater than 80% right internal carotid artery stenosis on duplex. She previously underwent left carotid endarterectomy by Dr. Donnetta Kirk in 2015. She denies any amaurosis fugax, sudden onset weakness or numbness in the extremities and slurred speech.  She denies any chest discomfort or shortness of breath. 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 She is on Plavix for this. She is on a beta blocker for hypertension and a statin for hyperlipidemia. She is on oral hypoglycemics for diabetes.  She has never been a smoker.  Past Medical History:  Diagnosis Date  . Anxiety    takes Celexa daily  . Back pain    occasionally  . Carotid artery occlusion   . Cataract    left and immature  . Coronary artery disease   . Diabetes mellitus    takes Metformin and Glipizide daily  . Dizziness    takes Meclizine daily as needed  . GERD (gastroesophageal reflux disease)    takes Omeprazole daily as needed  . Headache(784.0)   . Hyperlipidemia    takes Atorvastatin daily  . Hypertension    takes Carvedilol daily  . Muscle spasm    takes Robaxin daily as needed  . Nausea    takes Phenergan daily as needed  . Pneumonia    hx of-in high school  . Restless leg    takes Requip daily as needed  . Seasonal allergies    takes Claritin daily as needed and Afrin as needed  . Shortness of breath    with exertion  . Urinary urgency     Family History  Problem Relation Age of Onset  . Heart attack Father   . Heart disease Father 64    Before age of 57  . Hyperlipidemia Father   . Heart disease  Brother     Heart dissease before age 54  . Hyperlipidemia Brother   . Heart attack Paternal Grandfather   . Heart disease Paternal Grandfather     SOCIAL HISTORY: Social History  Substance Use Topics  . Smoking status: Never Smoker  . Smokeless tobacco: Never Used  . Alcohol use No    Allergies  Allergen Reactions  . Codeine Other (See Comments)    Abnormal behavior  . Latex Other (See Comments)    tears skin  . Morphine And Related Other (See Comments)    Affects BP and blood sugar.    Current Outpatient Prescriptions  Medication Sig Dispense Refill  . acetaminophen (TYLENOL) 500 MG tablet Take 1,000 mg by mouth every 6 (six) hours as needed for moderate pain. For pain    . Ascorbic Acid (VITAMIN C) 1000 MG tablet Take 1,000 mg by mouth daily.    Marland Kitchen aspirin EC 81 MG tablet Take 81 mg by mouth daily.    Marland Kitchen atorvastatin (LIPITOR) 40 MG tablet Take 40 mg by mouth daily.     . Calcium Carbonate-Vitamin D (CALTRATE 600+D PO) Take 1 tablet by mouth daily.    . carvedilol (COREG) 25 MG tablet TAKE 1 TABLET BY MOUTH 2  TIMES DAILY WITH A MEAL 180 tablet 3  .  citalopram (CELEXA) 20 MG tablet Take 20 mg by mouth every morning.     . clopidogrel (PLAVIX) 75 MG tablet Take 75 mg by mouth every morning.     . diphenhydrAMINE (BENADRYL) 25 MG tablet Take 25 mg by mouth every 6 (six) hours as needed for itching or allergies.    . fish oil-omega-3 fatty acids 1000 MG capsule Take 1 g by mouth daily.    Marland Kitchen gabapentin (NEURONTIN) 300 MG capsule     . glipiZIDE (GLUCOTROL XL) 10 MG 24 hr tablet Take 10 mg by mouth daily.    Marland Kitchen HYDROcodone-acetaminophen (NORCO) 10-325 MG tablet     . ibuprofen (ADVIL,MOTRIN) 200 MG tablet Take 800 mg by mouth every 6 (six) hours as needed for pain.     Marland Kitchen lisinopril (PRINIVIL,ZESTRIL) 20 MG tablet TAKE 1 TABLET BY MOUTH  DAILY. KEEP OFFICE VISIT. 90 tablet 3  . meclizine (ANTIVERT) 25 MG tablet Take 25 mg by mouth 3 (three) times daily as needed. For dizziness      . metFORMIN (GLUCOPHAGE) 1000 MG tablet Take 1,000 mg by mouth 2 (two) times daily with a meal.     . methocarbamol (ROBAXIN) 500 MG tablet Take 500 mg by mouth every 8 (eight) hours as needed for muscle spasms.    . nitroGLYCERIN (NITROSTAT) 0.4 MG SL tablet Place 1 tablet (0.4 mg total) under the tongue every 5 (five) minutes as needed. Chest pain. 25 tablet 5  . pantoprazole (PROTONIX) 40 MG tablet Take 40 mg by mouth daily.     . promethazine (PHENERGAN) 12.5 MG tablet Take 12.5-25 mg by mouth every 8 (eight) hours as needed. nausea     No current facility-administered medications for this visit.     REVIEW OF SYSTEMS:  [X]  denotes positive finding, [ ]  denotes negative finding Cardiac  Comments:  Chest pain or chest pressure:    Shortness of breath upon exertion:    Short of breath when lying flat:    Irregular heart rhythm:        Vascular    Pain in calf, thigh, or hip brought on by ambulation:    Pain in feet at night that wakes you up from your sleep:     Blood clot in your veins:    Leg swelling:         Pulmonary    Oxygen at home:    Productive cough:     Wheezing:         Neurologic    Sudden weakness in arms or legs:     Sudden numbness in arms or legs:     Sudden onset of difficulty speaking or slurred speech:    Temporary loss of vision in one eye:     Problems with dizziness:         Gastrointestinal    Blood in stool:     Vomited blood:         Genitourinary    Burning when urinating:     Blood in urine:        Psychiatric    Major depression:         Hematologic    Bleeding problems:    Problems with blood clotting too easily:        Skin    Rashes or ulcers:        Constitutional    Fever or chills:      PHYSICAL EXAM: Vitals:   03/12/16 1524 03/12/16 1528  BP: (!) 144/77 (!) 155/81  Pulse: 61   Resp: 16   Temp: 98.7 F (37.1 C)   TempSrc: Oral   SpO2: 97%   Weight: 174 lb (78.9 kg)   Height: 5\' 5"  (1.651 m)     GENERAL: The  patient is a well-nourished female, in no acute distress. The vital signs are documented above. CARDIAC: There is a regular rate and rhythm. Right carotid bruit. VASCULAR: 2+ radial pulses bilaterally, equal and symmetric. PULMONARY: There is good air exchange bilaterally without wheezing or rales. MUSCULOSKELETAL: There are no major deformities or cyanosis. NEUROLOGIC: No focal deficits. SKIN: There are no ulcers or rashes noted. PSYCHIATRIC: The patient has a normal affect.  DATA:  Carotid duplex 03/07/2016  Right: 80-99% right proximal internal carotid artery stenosis Patent left carotid and arterectomy site with some hyperplasia with velocities suggestive of 40-59% stenosis Vertebral arteries patent and antegrade  MEDICAL ISSUES: Asymptomatic high grade right internal carotid artery stenosis  Recommended right carotid endarterectomy for stroke prevention. Discussed that stenosis greater than 80% carries an annual 5% risk of stroke. Discussed the stroke rate associated with carotid endarterectomy is around 1%. The patient is willing to proceed. Plan for right carotid endarterectomy at the patient's convenience. She will stop Plavix prior to the procedure.   Virgina Jock, PA-C [facility] MD: Gaven Eugene  I have examined the patient, reviewed and agree with above. Asymptomatic carotid disease. Have recommended right endarterectomy for reduction of stroke risk. She did quite well with her left carotid surgery.  Curt Jews, MD 03/12/2016 4:38 PM

## 2016-03-22 NOTE — Anesthesia Postprocedure Evaluation (Signed)
Anesthesia Post Note  Patient: Andrea Kirk  Procedure(s) Performed: Procedure(s) (LRB): RIGHT ENDARTERECTOMY CAROTID (Right) PATCH ANGIOPLASTY (Right)  Patient location during evaluation: PACU Anesthesia Type: General Level of consciousness: awake and alert Pain management: pain level controlled Vital Signs Assessment: post-procedure vital signs reviewed and stable Respiratory status: spontaneous breathing, nonlabored ventilation, respiratory function stable and patient connected to nasal cannula oxygen Cardiovascular status: blood pressure returned to baseline and stable Postop Assessment: no signs of nausea or vomiting Anesthetic complications: no       Last Vitals:  Vitals:   03/22/16 1238 03/22/16 1245  BP: (!) 175/72   Pulse:  71  Resp:  11  Temp:      Last Pain:  Vitals:   03/22/16 1240  TempSrc:   PainSc: 6                  Samya Siciliano S

## 2016-03-22 NOTE — Transfer of Care (Signed)
Immediate Anesthesia Transfer of Care Note  Patient: Andrea Kirk  Procedure(s) Performed: Procedure(s): RIGHT ENDARTERECTOMY CAROTID (Right) PATCH ANGIOPLASTY (Right)  Patient Location: PACU  Anesthesia Type:General  Level of Consciousness: awake, alert  and oriented  Airway & Oxygen Therapy: Patient Spontanous Breathing and Patient connected to nasal cannula oxygen  Post-op Assessment: Report given to RN, Post -op Vital signs reviewed and stable, Patient moving all extremities X 4 and Patient able to stick tongue midline  Post vital signs: Reviewed and stable  Last Vitals:  Vitals:   03/22/16 0752  BP: (!) 168/57  Pulse: 66  Resp: 20  Temp: 37 C    Last Pain:  Vitals:   03/22/16 0808  TempSrc:   PainSc: 0-No pain      Patients Stated Pain Goal: 3 (46/56/81 2751)  Complications: No apparent anesthesia complications

## 2016-03-23 LAB — BASIC METABOLIC PANEL
Anion gap: 6 (ref 5–15)
BUN: 11 mg/dL (ref 6–20)
CO2: 26 mmol/L (ref 22–32)
Calcium: 9.2 mg/dL (ref 8.9–10.3)
Chloride: 106 mmol/L (ref 101–111)
Creatinine, Ser: 0.68 mg/dL (ref 0.44–1.00)
GFR calc Af Amer: >60 mL/min (ref >60)
GFR calc non Af Amer: >60 mL/min (ref >60)
Glucose, Bld: 150 mg/dL — ABNORMAL HIGH (ref 65–99)
Potassium: 4.8 mmol/L (ref 3.5–5.1)
Sodium: 138 mmol/L (ref 135–145)

## 2016-03-23 LAB — CBC
HCT: 31.7 % — ABNORMAL LOW (ref 36.0–46.0)
Hemoglobin: 10.3 g/dL — ABNORMAL LOW (ref 12.0–15.0)
MCH: 32.8 pg (ref 26.0–34.0)
MCHC: 32.5 g/dL (ref 30.0–36.0)
MCV: 101 fL — ABNORMAL HIGH (ref 78.0–100.0)
Platelets: 175 10*3/uL (ref 150–400)
RBC: 3.14 MIL/uL — ABNORMAL LOW (ref 3.87–5.11)
RDW: 13.3 % (ref 11.5–15.5)
WBC: 7.9 10*3/uL (ref 4.0–10.5)

## 2016-03-23 LAB — GLUCOSE, CAPILLARY: Glucose-Capillary: 173 mg/dL — ABNORMAL HIGH (ref 65–99)

## 2016-03-23 NOTE — Progress Notes (Addendum)
  [facility] Progress Note  Subjective  - POD #1  Having headache. Just got some tylenol. Tolerated dinner last night.   Objective Vitals:   03/22/16 2308 03/22/16 2325  BP: (!) 184/71 (!) 164/66  Pulse: 83   Resp: 16   Temp: 98.1 F (36.7 C)     Intake/Output Summary (Last 24 hours) at 03/23/16 0749 Last data filed at 03/23/16 0552  Gross per 24 hour  Intake          1740.83 ml  Output             1800 ml  Net           -59.17 ml   Right neck mildly swollen without hematoma No facial droop. Tongue is midline. 5/5 strength upper and lower extremities bilaterally.   Assessment/Planning: 76 y.o. female is s/p: right carotid endarterectomy 1 Day Post-Op   Neuro exam intact.  Neck without hematoma. Has ambulated, voided and tolerating diet without difficulty. D/c home today.   Alvia Grove 03/23/2016 7:49 AM --  Laboratory CBC    Component Value Date/Time   WBC 7.9 03/23/2016 0507   HGB 10.3 (L) 03/23/2016 0507   HCT 31.7 (L) 03/23/2016 0507   PLT 175 03/23/2016 0507    BMET    Component Value Date/Time   NA 138 03/23/2016 0507   K 4.8 03/23/2016 0507   CL 106 03/23/2016 0507   CO2 26 03/23/2016 0507   GLUCOSE 150 (H) 03/23/2016 0507   BUN 11 03/23/2016 0507   CREATININE 0.68 03/23/2016 0507   CALCIUM 9.2 03/23/2016 0507   GFRNONAA >60 03/23/2016 0507   GFRAA >60 03/23/2016 0507    COAG Lab Results  Component Value Date   INR 1.02 03/19/2016   INR 1.01 04/08/2013   INR 1.04 03/15/2011   No results found for: PTT  Antibiotics Anti-infectives    Start     Dose/Rate Route Frequency Ordered Stop   03/22/16 2030  cefUROXime (ZINACEF) 1.5 g in dextrose 5 % 50 mL IVPB     1.5 g 100 mL/hr over 30 Minutes Intravenous Every 12 hours 03/22/16 1428 03/23/16 2029   03/22/16 0745  dextrose 5 % with cefUROXime (ZINACEF) ADS Med    Comments:  Leandrew Koyanagi   : cabinet override      03/22/16 0745 03/22/16 0925   03/22/16 0743   cefUROXime (ZINACEF) 1.5 g in dextrose 5 % 50 mL IVPB     1.5 g 100 mL/hr over 30 Minutes Intravenous 30 min pre-op 03/22/16 0743 03/22/16 0925       Virgina Jock, PA-C [facility] Office: [phone] Pager: [phone] 03/23/2016 7:49 AM  I agree with the above.  I have seen and evaluated the patient.  She is postoperative day #1, status post right carotid endarterectomy.  She does not have any neurologic deficits.  She has a mild headache.  She is ambulating without difficulty and tolerating her diet.  She is set for discharge today with follow-up in 2 weeks with Dr. early.  Annamarie Major

## 2016-03-25 ENCOUNTER — Encounter (HOSPITAL_COMMUNITY): Payer: Self-pay | Admitting: Vascular Surgery

## 2016-03-25 ENCOUNTER — Telehealth: Payer: Self-pay | Admitting: Vascular Surgery

## 2016-03-25 NOTE — Telephone Encounter (Signed)
-----   Message from Mena Goes, RN sent at 03/22/2016  4:53 PM EDT ----- Regarding: 2 weeks   ----- Message ----- From: Alvia Grove, PA-C Sent: 03/22/2016   2:34 PM To: [facility] Charge Pool  s/p right CEA 03/22/16  F/u with Dr. Donnetta Hutching in 2 weeks.  Thanks Maudie Mercury

## 2016-03-25 NOTE — Telephone Encounter (Signed)
sched appt 04/10/16 at 11:45. Lm on hm# to inform pt.

## 2016-03-26 NOTE — Discharge Summary (Signed)
[facility] Discharge Summary  Andrea Kirk Feb 01, 1940 76 y.o. female  [id number]  Admission Date: 03/22/2016  Discharge Date: 03/23/2016  Physician: Curt Jews, MD  Admission Diagnosis: Right carotid artery stenosis I65.21  HPI:   This is a 76 y.o. female who presents for evaluation for right carotid endarterectomy. She was seen recently on 03/07/2016 by our nurse practitioner. At that time, she had greater than 80% right internal carotid artery stenosis on duplex. She previously underwent left carotid endarterectomy by Dr. Donnetta Hutching in 2015. She denies any amaurosis fugax, sudden onset weakness or numbness in the extremities and slurred speech.  She denies any chest discomfort or shortness of breath. 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 She is on Plavix for this. She is on a beta blocker for hypertension and a statin for hyperlipidemia. She is on oral hypoglycemics for diabetes.  She has never been a smoker.  Hospital Course:  The patient was admitted to the hospital and taken to the operating room on 03/22/2016 and underwent right carotid endarterectomy.  The patient tolerated the procedure well and was transported to the PACU in stable condition.  By POD 1, the patient's neuro status was intact. Her incision was without hematoma. She did have a mild headache. She was tolerating a diet and ambulating without difficulty. She was discharged home on POD 1 in good condition.   Discharge Instructions:   The patient is discharged to home with extensive instructions on wound care and progressive ambulation.  They are instructed not to drive or perform any heavy lifting until returning to see the physician in his office.  Discharge Instructions    CAROTID Sugery: Call MD for difficulty swallowing or speaking; weakness in arms or legs that is a new symtom; severe headache.  If you have increased swelling in the neck  and/or  are having difficulty breathing, CALL [phone]    Complete by:  As directed    Call MD for:  redness, tenderness, or signs of infection (pain, swelling, bleeding, redness, odor or green/yellow discharge around incision site)    Complete by:  As directed    Call MD for:  severe or increased pain, loss or decreased feeling  in affected limb(s)    Complete by:  As directed    Call MD for:  temperature >100.5    Complete by:  As directed    Discharge wound care:    Complete by:  As directed    Wash wound daily with soap and water and pat dry. Do not apply any creams or ointments on your incisions. Do not pull off the skin glue. It will peel off on its own.   Driving Restrictions    Complete by:  As directed    No driving for 1 week and while on pain medication   Increase activity slowly    Complete by:  As directed    Walk with assistance use walker or cane as needed   Lifting restrictions    Complete by:  As directed    No lifting for 2 weeks   Resume previous diet    Complete by:  As directed       Discharge Diagnosis:  Right carotid artery stenosis I65.21  Secondary Diagnosis: Patient Active Problem List   Diagnosis Date Noted  . Asymptomatic stenosis of right carotid artery 03/22/2016  . Diabetes mellitus ([place]) 05/02/2015  . Carotid stenosis 11/16/2013  .  Aftercare following surgery of the circulatory system, [place] 05/11/2013  . Carotid artery disease ([place]) 04/16/2013  . Coronary atherosclerosis of native coronary artery 03/11/2013  . Essential hypertension, benign 03/11/2013  . Mixed hyperlipidemia 03/11/2013  . Occlusion and stenosis of carotid artery without mention of cerebral infarction 07/19/2011   Past Medical History:  Diagnosis Date  . Anxiety    takes Celexa daily  . Back pain    occasionally  . Carotid artery occlusion   . Cataract    left and immature  . Coronary artery disease   . Depression   . Diabetes mellitus    takes Metformin and Glipizide  daily  . Dizziness    takes Meclizine daily as needed  . GERD (gastroesophageal reflux disease)    takes Omeprazole daily as needed  . Headache(784.0)   . Hyperlipidemia    takes Atorvastatin daily  . Hypertension    takes Carvedilol daily  . Muscle spasm    takes Robaxin daily as needed  . Nausea    takes Phenergan daily as needed  . Pneumonia    hx of-in high school  . Restless leg    takes Requip daily as needed  . Seasonal allergies    takes Claritin daily as needed and Afrin as needed  . Shortness of breath    with exertion  . Urinary urgency     Allergies as of 03/23/2016      Reactions   Codeine Other (See Comments)   Abnormal behavior   Latex Other (See Comments)   tears skin   Morphine And Related Other (See Comments)   Affects BP and blood sugar.      Medication List    TAKE these medications   acetaminophen 500 MG tablet Commonly known as:  TYLENOL Take 1,000 mg by mouth every 6 (six) hours as needed for moderate pain. For pain   aspirin EC 81 MG tablet Take 81 mg by mouth every evening.   atorvastatin 40 MG tablet Commonly known as:  LIPITOR Take 40 mg by mouth daily at 6 PM.   B-12 2500 MCG Tabs Take 2,500 mcg by mouth daily.   CALTRATE 600+D PO Take 1 tablet by mouth daily.   carvedilol 25 MG tablet Commonly known as:  COREG TAKE 1 TABLET BY MOUTH 2  TIMES DAILY WITH A MEAL   citalopram 20 MG tablet Commonly known as:  CELEXA Take 20 mg by mouth every morning.   clopidogrel 75 MG tablet Commonly known as:  PLAVIX Take 75 mg by mouth every morning. Will stop 5 days prior to procedure   fish oil-omega-3 fatty acids 1000 MG capsule Take 1 g by mouth daily.   gabapentin 300 MG capsule Commonly known as:  NEURONTIN Take 300 mg by mouth 4 (four) times daily.   glipiZIDE 10 MG 24 hr tablet Commonly known as:  GLUCOTROL XL Take 10 mg by mouth daily with breakfast.   HYDROcodone-acetaminophen 10-325 MG tablet Commonly known as:   NORCO Take 1 tablet by mouth 2 (two) times daily as needed for moderate pain.   ibuprofen 200 MG tablet Commonly known as:  ADVIL,MOTRIN Take 400 mg by mouth every 6 (six) hours as needed for pain.   lisinopril 20 MG tablet Commonly known as:  PRINIVIL,ZESTRIL TAKE 1 TABLET BY MOUTH  DAILY. KEEP OFFICE VISIT. What changed:  See the new instructions.   Magnesium 250 MG Tabs Take 250 mg by mouth daily.   meclizine 25 MG tablet  Commonly known as:  ANTIVERT Take 25 mg by mouth 2 (two) times daily as needed for dizziness. For dizziness   metFORMIN 1000 MG tablet Commonly known as:  GLUCOPHAGE Take 1,000 mg by mouth 2 (two) times daily with a meal.   methocarbamol 500 MG tablet Commonly known as:  ROBAXIN Take 500 mg by mouth every 8 (eight) hours as needed for muscle spasms.   nitroGLYCERIN 0.4 MG SL tablet Commonly known as:  NITROSTAT Place 1 tablet (0.4 mg total) under the tongue every 5 (five) minutes as needed. Chest pain. What changed:  reasons to take this  additional instructions   pantoprazole 40 MG tablet Commonly known as:  PROTONIX Take 40 mg by mouth daily as needed (INDIGESTION).   promethazine 12.5 MG tablet Commonly known as:  PHENERGAN Take 12.5 mg by mouth every 8 (eight) hours as needed for nausea or vomiting. nausea   vitamin C 1000 MG tablet Take 1,000 mg by mouth daily.       Percocet #8 No Refill  Disposition: Home  Patient's condition: is Good  Follow up: 1. Dr.  Donnetta Hutching in 2 weeks.   Virgina Jock, PA-C [facility] [phone]  --- For [facility] use --- Instructions: Press F2 to tab through selections.  Delete question if not applicable.   Modified Rankin score at D/C (0-6): 0  IV medication needed for:  1. Hypertension: No 2. Hypotension: No  Post-op Complications: No  1. Post-op CVA or TIA: No  2. CN injury: No  3. Myocardial infarction: No  4.  CHF: No  5.  Dysrhythmia (new): No  6. Wound  infection: No  7. Reperfusion symptoms: No  8. Return to OR: No  Discharge medications: Statin use:  Yes If No: [ ]  For Medical reasons, [ ]  Non-compliant, [ ]  Not-indicated ASA use:  Yes  If No: [ ]  For Medical reasons, [ ]  Non-compliant, [ ]  Not-indicated Beta blocker use:  Yes If No: [ ]  For Medical reasons, [ ]  Non-compliant, [ ]  Not-indicated ACE-Inhibitor use:  Yes If No: [ ]  For Medical reasons, [ ]  Non-compliant, [ ]  Not-indicated P2Y12 Antagonist use: Yes, [x ] Plavix, [ ]  Plasugrel, [ ]  Ticlopinine, [ ]  Ticagrelor, [ ]  Other, [ ]  No for medical reason, [ ]  Non-compliant, [ ]  Not-indicated Anti-coagulant use:  No, [ ]  Warfarin, [ ]  Rivaroxaban, [ ]  Dabigatran, [ ]  Other, [ ]  No for medical reason, [ ]  Non-compliant, [ x] Not-indicated

## 2016-03-29 ENCOUNTER — Encounter: Payer: Self-pay | Admitting: Vascular Surgery

## 2016-04-02 ENCOUNTER — Ambulatory Visit: Payer: Medicare Other | Admitting: Vascular Surgery

## 2016-04-10 ENCOUNTER — Ambulatory Visit (INDEPENDENT_AMBULATORY_CARE_PROVIDER_SITE_OTHER): Payer: Self-pay | Admitting: Vascular Surgery

## 2016-04-10 ENCOUNTER — Encounter: Payer: Self-pay | Admitting: Vascular Surgery

## 2016-04-10 VITALS — BP 95/61 | HR 68 | Temp 98.0°F | Resp 16 | Ht 65.0 in | Wt 168.0 lb

## 2016-04-10 DIAGNOSIS — I6523 Occlusion and stenosis of bilateral carotid arteries: Secondary | ICD-10-CM

## 2016-04-10 NOTE — Progress Notes (Signed)
Patient name: Andrea Kirk MRN: [id number] DOB: Sep 26, 1940 Sex: female  REASON FOR VISIT: Follow-up right carotid endarterectomy for severe asymptomatic disease on 03/22/2016  HPI: Andrea Kirk is a 76 y.o. female here for follow-up. She did well with surgery and was discharged home on postoperative day #1. She's had no neurologic deficits and is healed without difficulty. She did trip on a foot stool yesterday and has a bruise on her right great toe on her right wrist. This does not appear to have any displacement in her wrist or toe.  Current Outpatient Prescriptions  Medication Sig Dispense Refill  . acetaminophen (TYLENOL) 500 MG tablet Take 1,000 mg by mouth every 6 (six) hours as needed for moderate pain. For pain    . Ascorbic Acid (VITAMIN C) 1000 MG tablet Take 1,000 mg by mouth daily.    Marland Kitchen aspirin EC 81 MG tablet Take 81 mg by mouth every evening.     Marland Kitchen atorvastatin (LIPITOR) 40 MG tablet Take 40 mg by mouth daily at 6 PM.     . Calcium Carbonate-Vitamin D (CALTRATE 600+D PO) Take 1 tablet by mouth daily.    . carvedilol (COREG) 25 MG tablet TAKE 1 TABLET BY MOUTH 2  TIMES DAILY WITH A MEAL 180 tablet 3  . citalopram (CELEXA) 20 MG tablet Take 20 mg by mouth every morning.     . clopidogrel (PLAVIX) 75 MG tablet Take 75 mg by mouth every morning. Will stop 5 days prior to procedure    . Cyanocobalamin (B-12) 2500 MCG TABS Take 2,500 mcg by mouth daily.    . fish oil-omega-3 fatty acids 1000 MG capsule Take 1 g by mouth daily.    Marland Kitchen gabapentin (NEURONTIN) 300 MG capsule Take 300 mg by mouth 4 (four) times daily.     Marland Kitchen glipiZIDE (GLUCOTROL XL) 10 MG 24 hr tablet Take 10 mg by mouth daily with breakfast.     . HYDROcodone-acetaminophen (NORCO) 10-325 MG tablet Take 1 tablet by mouth 2 (two) times daily as needed for moderate pain. 8 tablet 0  . ibuprofen (ADVIL,MOTRIN) 200 MG tablet Take 400 mg by mouth every 6 (six) hours as needed for pain.     Marland Kitchen  lisinopril (PRINIVIL,ZESTRIL) 20 MG tablet TAKE 1 TABLET BY MOUTH  DAILY. KEEP OFFICE VISIT. (Patient taking differently: TAKE 1 TABLET BY MOUTH  DAILY AT NIGHT. KEEP OFFICE VISIT.) 90 tablet 3  . Magnesium 250 MG TABS Take 250 mg by mouth daily.    . meclizine (ANTIVERT) 25 MG tablet Take 25 mg by mouth 2 (two) times daily as needed for dizziness. For dizziness     . metFORMIN (GLUCOPHAGE) 1000 MG tablet Take 1,000 mg by mouth 2 (two) times daily with a meal.     . methocarbamol (ROBAXIN) 500 MG tablet Take 500 mg by mouth every 8 (eight) hours as needed for muscle spasms.    . nitroGLYCERIN (NITROSTAT) 0.4 MG SL tablet Place 1 tablet (0.4 mg total) under the tongue every 5 (five) minutes as needed. Chest pain. (Patient taking differently: Place 0.4 mg under the tongue every 5 (five) minutes as needed for chest pain. ) 25 tablet 5  . pantoprazole (PROTONIX) 40 MG tablet Take 40 mg by mouth daily as needed (INDIGESTION).     Marland Kitchen promethazine (PHENERGAN) 12.5 MG tablet Take 12.5 mg by mouth every 8 (eight) hours as needed for nausea or vomiting. nausea      No current facility-administered medications for this  visit.      PHYSICAL EXAM: Vitals:   04/10/16 1129 04/10/16 1135  BP: 94/60 95/61  Pulse: 68   Resp: 16   Temp: 98 F (36.7 C)   TempSrc: Oral   SpO2: 95%   Weight: 168 lb (76.2 kg)   Height: 5\' 5"  (1.651 m)     GENERAL: The patient is a well-nourished female, in no acute distress. The vital signs are documented above. Neck incision is healed quite nicely with no bruits bilaterally. Neurologically she is grossly intact  MEDICAL ISSUES: Stable overall. She wishes to return to work and is able to do this as of today with no limitations. I did write a prescription for a wrist brace for her comfort. If this persisted in having pain she will seek attention with her primary care provider. I will see her again in 6 months with carotid duplex follow-up   Rosetta Posner, MD [facility] Tel [phone] Pager [phone]

## 2016-04-11 NOTE — Addendum Note (Signed)
Addended by: Lianne Cure A on: 04/11/2016 09:21 AM   Modules accepted: Orders

## 2016-05-14 ENCOUNTER — Encounter: Payer: Self-pay | Admitting: Interventional Cardiology

## 2016-05-14 ENCOUNTER — Telehealth: Payer: Self-pay

## 2016-05-14 NOTE — Telephone Encounter (Signed)
Phone call from pt.  Reported she has had 3 episodes of falling within last month.  Stated she has fractured her right wrist, and her collar bone.  Stated "my vertigo is acting up."   Reported she is anemic; was told her hgb. was 6.8 pm 5/3.  Questioned if Dr. Donnetta Hutching feels the loss of balance and low hgb. Are at all related to her recent carotid surgery.  Denied any visual disturbance, disorientation, weakness of extremities.  Stated her PCP noted slurred speech about a week ago.  The pt. denied noting this.  Reported sometimes her mouth is very dry.  Reported her PCP is monitoring her anemia, and also is planning to order an MRI.    Discussed pt's symptoms with Dr. Donnetta Hutching.  Reported the low hgb. it is not likely related to her Carotid surgery, as there were no signs of hematoma or bleeding at the neck incision, and there was minimal blood loss during the surgery.  Advised that the PCP should evaluate pt's symptoms.  Notified pt. Of Dr. Luther Parody response/ recommendations.  Pt. verb.understanding.

## 2016-05-15 ENCOUNTER — Other Ambulatory Visit: Payer: Self-pay | Admitting: Family Medicine

## 2016-05-15 DIAGNOSIS — R410 Disorientation, unspecified: Secondary | ICD-10-CM

## 2016-05-28 ENCOUNTER — Ambulatory Visit: Payer: Medicare Other | Admitting: Podiatry

## 2016-05-29 ENCOUNTER — Ambulatory Visit
Admission: RE | Admit: 2016-05-29 | Discharge: 2016-05-29 | Disposition: A | Discharge status: Home / Self Care | Payer: Medicare Other | Source: Ambulatory Visit | Attending: Family Medicine | Admitting: Family Medicine

## 2016-05-29 DIAGNOSIS — R410 Disorientation, unspecified: Secondary | ICD-10-CM

## 2016-05-29 NOTE — Progress Notes (Signed)
Patient ID: Roland Rack Dingley, female   DOB: 23-May-1940, 76 y.o.   MRN: [id number]     Cardiology Office Note   Date:  05/30/2016   ID:  Roland Rack Protzman, DOB August 16, 1940, MRN [id number]  PCP:  Aura Dials, MD    No chief complaint on file. f/u CAD   Wt Readings from Last 3 Encounters:  05/30/16 170 lb 12.8 oz (77.5 kg)  04/10/16 168 lb (76.2 kg)  03/22/16 166 lb 7.2 oz (75.5 kg)       History of Present Illness: Thora L Ciavarella is a 76 y.o. female  who had CABG in 1997.  She suffered a fall in 2014 due to her knee giving way. She did not pass out at that time. She had significant bruising on her face at that time.    She works at [employer] on [address].  She works only between 7A-7P for 4-5 hours at a time due to leg problems.    She had a right CEA a few months ago in 2018.  She had several falls since the surgery.  She has broken her right wrist.    She is being treated for anemia with oral iron.  No issues with constipation.  Even with all of this, Denies : Chest pain. Dizziness. Leg edema. Nitroglycerin use. Orthopnea. Palpitations. Paroxysmal nocturnal dyspnea. Shortness of breath. Syncope.   Per her report, Dr. Donnetta Hutching did not feel that the CEA was related to the falls.         Past Medical History:  Diagnosis Date  . Anxiety    takes Celexa daily  . Back pain    occasionally  . Carotid artery occlusion   . Cataract    left and immature  . Coronary artery disease   . Depression   . Diabetes mellitus    takes Metformin and Glipizide daily  . Dizziness    takes Meclizine daily as needed  . GERD (gastroesophageal reflux disease)    takes Omeprazole daily as needed  . Headache(784.0)   . Hyperlipidemia    takes Atorvastatin daily  . Hypertension    takes Carvedilol daily  . Muscle spasm    takes Robaxin daily as needed  . Nausea    takes Phenergan daily as needed  . Pneumonia    hx of-in high school  . Restless leg    takes Requip daily as needed  .  Seasonal allergies    takes Claritin daily as needed and Afrin as needed  . Shortness of breath    with exertion  . Urinary urgency     Past Surgical History:  Procedure Laterality Date  . COLONOSCOPY WITH PROPOFOL N/A 03/10/2012   Procedure: COLONOSCOPY WITH PROPOFOL;  Surgeon: Garlan Fair, MD;  Location: [facility];  Service: Endoscopy;  Laterality: N/A;  . CORNEAL TRANSPLANT Right   . CORONARY ARTERY BYPASS GRAFT  1997   x 6  . CORONARY ARTERY BYPASS GRAFT  Jan. 1997  . ENDARTERECTOMY Left 04/16/2013   Procedure: Left Carotid Artery Endatarectomy with Resection of Redundant Internal Carotid Artery;  Surgeon: Rosetta Posner, MD;  Location: [place];  Service: Vascular;  Laterality: Left;  . ENDARTERECTOMY Right 03/22/2016   Procedure: RIGHT ENDARTERECTOMY CAROTID;  Surgeon: Rosetta Posner, MD;  Location: [facility] OR;  Service: Vascular;  Laterality: Right;  . ESOPHAGOGASTRODUODENOSCOPY N/A 03/10/2012   Procedure: ESOPHAGOGASTRODUODENOSCOPY (EGD);  Surgeon: Garlan Fair, MD;  Location: [facility];  Service: Endoscopy;  Laterality:  N/A;  . EYE SURGERY  March 12, 2001   CORNEA TRANSPLANT Right eye  . PATCH ANGIOPLASTY Right 03/22/2016   Procedure: PATCH ANGIOPLASTY;  Surgeon: Rosetta Posner, MD;  Location: [place];  Service: Vascular;  Laterality: Right;  . PR VEIN BYPASS GRAFT,AORTO-FEM-POP  1997  . SPINE SURGERY  march 2013   Back surgery  . TONSILLECTOMY    . TRIGGER FINGER RELEASE Left    thumb     Current Outpatient Prescriptions  Medication Sig Dispense Refill  . acetaminophen (TYLENOL) 500 MG tablet Take 1,000 mg by mouth every 6 (six) hours as needed for moderate pain. For pain    . Ascorbic Acid (VITAMIN C) 1000 MG tablet Take 1,000 mg by mouth daily.    Marland Kitchen aspirin EC 81 MG tablet Take 81 mg by mouth every evening.     Marland Kitchen atorvastatin (LIPITOR) 40 MG tablet Take 40 mg by mouth daily at 6 PM.     . Calcium Carbonate-Vitamin D (CALTRATE 600+D PO) Take 1 tablet by mouth daily.    .  carvedilol (COREG) 25 MG tablet TAKE 1 TABLET BY MOUTH 2  TIMES DAILY WITH A MEAL 180 tablet 3  . citalopram (CELEXA) 20 MG tablet Take 20 mg by mouth every morning.     . clopidogrel (PLAVIX) 75 MG tablet Take 75 mg by mouth every morning. Will stop 5 days prior to procedure    . Cyanocobalamin (B-12) 2500 MCG TABS Take 2,500 mcg by mouth daily.    . fish oil-omega-3 fatty acids 1000 MG capsule Take 1 g by mouth daily.    Marland Kitchen gabapentin (NEURONTIN) 300 MG capsule Take 300 mg by mouth 4 (four) times daily.     Marland Kitchen glipiZIDE (GLUCOTROL XL) 10 MG 24 hr tablet Take 10 mg by mouth daily with breakfast.     . HYDROcodone-acetaminophen (NORCO) 10-325 MG tablet Take 1 tablet by mouth 2 (two) times daily as needed for moderate pain. 8 tablet 0  . lisinopril (PRINIVIL,ZESTRIL) 20 MG tablet TAKE 1 TABLET BY MOUTH  DAILY. KEEP OFFICE VISIT. (Patient taking differently: TAKE 1 TABLET BY MOUTH  DAILY AT NIGHT. KEEP OFFICE VISIT.) 90 tablet 3  . Magnesium 250 MG TABS Take 250 mg by mouth daily.    . meclizine (ANTIVERT) 25 MG tablet Take 25 mg by mouth 2 (two) times daily as needed for dizziness. For dizziness     . metFORMIN (GLUCOPHAGE) 1000 MG tablet Take 1,000 mg by mouth 2 (two) times daily with a meal.     . nitroGLYCERIN (NITROSTAT) 0.4 MG SL tablet Place 1 tablet (0.4 mg total) under the tongue every 5 (five) minutes as needed. Chest pain. (Patient taking differently: Place 0.4 mg under the tongue every 5 (five) minutes as needed for chest pain. ) 25 tablet 5  . pantoprazole (PROTONIX) 40 MG tablet Take 40 mg by mouth daily as needed (INDIGESTION).     Marland Kitchen promethazine (PHENERGAN) 12.5 MG tablet Take 12.5 mg by mouth every 8 (eight) hours as needed for nausea or vomiting. nausea      No current facility-administered medications for this visit.     Allergies:   Codeine; Latex; and Morphine and related    Social History:  The patient  reports that she has never smoked. She has never used smokeless tobacco.  She reports that she does not drink alcohol or use drugs.   Family History:  The patient's family history includes Heart attack in her father and  paternal grandfather; Heart disease in her brother and paternal grandfather; Heart disease (age of onset: 43) in her father; Hyperlipidemia in her brother and father.    ROS:  Please see the history of present illness.   Otherwise, review of systems are positive for recent sinus infection; appetite improving after recent UTI, fatigue.   All other systems are reviewed and negative.    PHYSICAL EXAM: VS:  BP 128/74 (BP Location: Right Arm, Patient Position: Sitting, Cuff Size: Large)   Pulse 72   Ht 5\' 5"  (1.651 m)   Wt 170 lb 12.8 oz (77.5 kg)   SpO2 97%   BMI 28.42 kg/m  , BMI Body mass index is 28.42 kg/m. GEN: Well nourished, well developed, in no acute distress  HEENT: normal  Neck: no JVD; 2/6 right  carotid bruit, no masses Cardiac: RRR; no murmurs, rubs, or gallops,no edema  Respiratory:  clear to auscultation bilaterally, normal work of breathing GI: soft, nontender, nondistended,  MS: no deformity or atrophy  Skin: warm and dry, no rash; scars on legs Neuro:  Strength and sensation are intact Psych: euthymic mood, full affect    EKG:   The ekg ordered today demonstrates NSR, isolated Q in lead 3 , no ST changes- no changes compared to the 2017 ECG   Recent Labs: 03/19/2016: ALT 21 03/23/2016: BUN 11; Creatinine, Ser 0.68; Hemoglobin 10.3; Platelets 175; Potassium 4.8; Sodium 138   Lipid Panel No results found for: CHOL, TRIG, HDL, CHOLHDL, VLDL, LDLCALC, LDLDIRECT   Other studies Reviewed: Additional studies/ records that were reviewed today with results demonstrating: 2012 stress test  Last stress test was in May 2012 which showed no ischemia and an ejection fraction 64%; Hbg, Cr, controlled in 3/18   ASSESSMENT AND PLAN:  1. CAD: Back on DAPT with aspirin and clopidogrel. Angina controlled on medical therapy. Continue  aggressive secondary prevention. 2. Hypertension: Well controlled. Continue blood pressure medicines including ACE inhibitor with a target of less than 130/80 given her diabetes. 3. Hypercholesterolemia: Recommended healthy diet. Continue lipid-lowering therapy to keep LDL less than 100. 4. Diabetes:  Recommended healthy diet and regular exercise. She should use her cane with exercise. She needs to avoid falling. 5. Carotid disease: She will have routine follow-up with the vascular surgeon. 6. Multiple issues have come up since the last visit.  Avoiding falls is a big priority for her.   Current medicines are reviewed at length with the patient today.  The patient concerns regarding her medicines were addressed.  The following changes have been made:  No change  Labs/ tests ordered today include:   No orders of the defined types were placed in this encounter.   Recommend 150 minutes/week of aerobic exercise Low fat, low carb, high fiber diet recommended  Disposition:   FU in 1 year   Signed, Larae Grooms, MD  05/30/2016 11:42 AM    [facility] [address], [place], [place]  [address] Phone: [phone]; Fax: [phone]

## 2016-05-30 ENCOUNTER — Encounter: Payer: Self-pay | Admitting: Interventional Cardiology

## 2016-05-30 ENCOUNTER — Ambulatory Visit (INDEPENDENT_AMBULATORY_CARE_PROVIDER_SITE_OTHER): Payer: Medicare Other | Admitting: Interventional Cardiology

## 2016-05-30 VITALS — BP 128/74 | HR 72 | Ht 65.0 in | Wt 170.8 lb

## 2016-05-30 DIAGNOSIS — I10 Essential (primary) hypertension: Secondary | ICD-10-CM

## 2016-05-30 DIAGNOSIS — I779 Disorder of arteries and arterioles, unspecified: Secondary | ICD-10-CM | POA: Diagnosis not present

## 2016-05-30 DIAGNOSIS — I25119 Atherosclerotic heart disease of native coronary artery with unspecified angina pectoris: Secondary | ICD-10-CM | POA: Diagnosis not present

## 2016-05-30 DIAGNOSIS — E782 Mixed hyperlipidemia: Secondary | ICD-10-CM | POA: Diagnosis not present

## 2016-05-30 DIAGNOSIS — E1159 Type 2 diabetes mellitus with other circulatory complications: Secondary | ICD-10-CM | POA: Diagnosis not present

## 2016-05-30 DIAGNOSIS — I739 Peripheral vascular disease, unspecified: Secondary | ICD-10-CM

## 2016-05-30 NOTE — Patient Instructions (Signed)

## 2016-06-04 ENCOUNTER — Other Ambulatory Visit (HOSPITAL_COMMUNITY): Payer: Self-pay | Admitting: Family Medicine

## 2016-06-04 DIAGNOSIS — R296 Repeated falls: Secondary | ICD-10-CM

## 2016-06-06 ENCOUNTER — Ambulatory Visit: Payer: Medicare Other | Admitting: Podiatry

## 2016-06-10 NOTE — Anesthesia Postprocedure Evaluation (Signed)
Anesthesia Post Note  Patient: Andrea Kirk  Procedure(s) Performed: Procedure(s) (LRB): RIGHT ENDARTERECTOMY CAROTID (Right) PATCH ANGIOPLASTY (Right)     Anesthesia Post Evaluation  Last Vitals:  Vitals:   03/22/16 2325 03/23/16 0751  BP: (!) 164/66 (!) 122/109  Pulse:  79  Resp:  17  Temp:  37.2 C    Last Pain:  Vitals:   03/23/16 0751  TempSrc: Oral  PainSc: 0-No pain                 Danyael Alipio S

## 2016-06-10 NOTE — Addendum Note (Signed)
Addendum  created 06/10/16 1228 by Siren Porrata, MD   Sign clinical note    

## 2016-06-20 ENCOUNTER — Ambulatory Visit (HOSPITAL_COMMUNITY)
Admission: RE | Admit: 2016-06-20 | Discharge: 2016-06-20 | Disposition: A | Discharge status: Home / Self Care | Payer: Medicare Other | Source: Ambulatory Visit | Attending: Family Medicine | Admitting: Family Medicine

## 2016-06-20 DIAGNOSIS — D333 Benign neoplasm of cranial nerves: Secondary | ICD-10-CM | POA: Diagnosis not present

## 2016-06-20 DIAGNOSIS — R296 Repeated falls: Secondary | ICD-10-CM

## 2016-06-20 LAB — CREATININE, SERUM
Creatinine, Ser: 0.8 mg/dL (ref 0.44–1.00)
GFR calc Af Amer: >60 mL/min (ref >60)
GFR calc non Af Amer: >60 mL/min (ref >60)

## 2016-06-20 MED ORDER — GADOBENATE DIMEGLUMINE 529 MG/ML IV SOLN
15.0000 mL | Freq: Once | INTRAVENOUS | Status: AC | PRN
  Administered 2016-06-20: 15 mL via INTRAVENOUS

## 2016-06-28 ENCOUNTER — Other Ambulatory Visit: Payer: Self-pay | Admitting: Interventional Cardiology

## 2016-07-16 DIAGNOSIS — R296 Repeated falls: Secondary | ICD-10-CM | POA: Insufficient documentation

## 2016-07-16 DIAGNOSIS — I10 Essential (primary) hypertension: Secondary | ICD-10-CM | POA: Insufficient documentation

## 2016-08-04 ENCOUNTER — Encounter (HOSPITAL_COMMUNITY): Payer: Self-pay

## 2016-08-04 ENCOUNTER — Observation Stay (HOSPITAL_COMMUNITY)
Admission: EM | Admit: 2016-08-04 | Discharge: 2016-08-06 | Disposition: A | Discharge status: Home Health | Payer: Medicare Other | Attending: Internal Medicine | Admitting: Internal Medicine

## 2016-08-04 ENCOUNTER — Emergency Department (HOSPITAL_COMMUNITY): Payer: Medicare Other

## 2016-08-04 DIAGNOSIS — E119 Type 2 diabetes mellitus without complications: Secondary | ICD-10-CM

## 2016-08-04 DIAGNOSIS — G9340 Encephalopathy, unspecified: Secondary | ICD-10-CM | POA: Diagnosis not present

## 2016-08-04 DIAGNOSIS — M6281 Muscle weakness (generalized): Secondary | ICD-10-CM | POA: Diagnosis not present

## 2016-08-04 DIAGNOSIS — Z885 Allergy status to narcotic agent status: Secondary | ICD-10-CM | POA: Insufficient documentation

## 2016-08-04 DIAGNOSIS — I2510 Atherosclerotic heart disease of native coronary artery without angina pectoris: Secondary | ICD-10-CM | POA: Insufficient documentation

## 2016-08-04 DIAGNOSIS — R42 Dizziness and giddiness: Secondary | ICD-10-CM | POA: Diagnosis not present

## 2016-08-04 DIAGNOSIS — Z7902 Long term (current) use of antithrombotics/antiplatelets: Secondary | ICD-10-CM | POA: Diagnosis not present

## 2016-08-04 DIAGNOSIS — K219 Gastro-esophageal reflux disease without esophagitis: Secondary | ICD-10-CM | POA: Insufficient documentation

## 2016-08-04 DIAGNOSIS — N179 Acute kidney failure, unspecified: Secondary | ICD-10-CM | POA: Diagnosis not present

## 2016-08-04 DIAGNOSIS — E860 Dehydration: Secondary | ICD-10-CM | POA: Diagnosis not present

## 2016-08-04 DIAGNOSIS — R296 Repeated falls: Secondary | ICD-10-CM | POA: Diagnosis not present

## 2016-08-04 DIAGNOSIS — F329 Major depressive disorder, single episode, unspecified: Secondary | ICD-10-CM | POA: Insufficient documentation

## 2016-08-04 DIAGNOSIS — G8929 Other chronic pain: Secondary | ICD-10-CM | POA: Diagnosis not present

## 2016-08-04 DIAGNOSIS — E1159 Type 2 diabetes mellitus with other circulatory complications: Secondary | ICD-10-CM | POA: Diagnosis not present

## 2016-08-04 DIAGNOSIS — R2681 Unsteadiness on feet: Secondary | ICD-10-CM | POA: Insufficient documentation

## 2016-08-04 DIAGNOSIS — E875 Hyperkalemia: Secondary | ICD-10-CM | POA: Diagnosis not present

## 2016-08-04 DIAGNOSIS — E538 Deficiency of other specified B group vitamins: Secondary | ICD-10-CM | POA: Insufficient documentation

## 2016-08-04 DIAGNOSIS — N390 Urinary tract infection, site not specified: Secondary | ICD-10-CM | POA: Insufficient documentation

## 2016-08-04 DIAGNOSIS — I10 Essential (primary) hypertension: Secondary | ICD-10-CM | POA: Diagnosis not present

## 2016-08-04 DIAGNOSIS — E785 Hyperlipidemia, unspecified: Secondary | ICD-10-CM | POA: Diagnosis not present

## 2016-08-04 DIAGNOSIS — R531 Weakness: Secondary | ICD-10-CM

## 2016-08-04 DIAGNOSIS — Z91040 Latex allergy status: Secondary | ICD-10-CM | POA: Insufficient documentation

## 2016-08-04 DIAGNOSIS — W19XXXA Unspecified fall, initial encounter: Secondary | ICD-10-CM

## 2016-08-04 HISTORY — DX: Acute kidney failure, unspecified: N17.9

## 2016-08-04 LAB — BASIC METABOLIC PANEL
Anion gap: 9 (ref 5–15)
BUN: 46 mg/dL — ABNORMAL HIGH (ref 6–20)
CO2: 30 mmol/L (ref 22–32)
Calcium: 9 mg/dL (ref 8.9–10.3)
Chloride: 102 mmol/L (ref 101–111)
Creatinine, Ser: 1.5 mg/dL — ABNORMAL HIGH (ref 0.44–1.00)
GFR calc Af Amer: 38 mL/min — ABNORMAL LOW (ref >60)
GFR calc non Af Amer: 33 mL/min — ABNORMAL LOW (ref >60)
Glucose, Bld: 206 mg/dL — ABNORMAL HIGH (ref 65–99)
Potassium: 5 mmol/L (ref 3.5–5.1)
Sodium: 141 mmol/L (ref 135–145)

## 2016-08-04 LAB — URINALYSIS, ROUTINE W REFLEX MICROSCOPIC
Bacteria, UA: NONE SEEN
Bilirubin Urine: NEGATIVE
Glucose, UA: NEGATIVE mg/dL
Hgb urine dipstick: NEGATIVE
Ketones, ur: NEGATIVE mg/dL
Leukocytes, UA: NEGATIVE
Nitrite: NEGATIVE
Protein, ur: 30 mg/dL — AB
Specific Gravity, Urine: 1.019 (ref 1.005–1.030)
pH: 5 (ref 5.0–8.0)

## 2016-08-04 LAB — CBC WITH DIFFERENTIAL/PLATELET
Basophils Absolute: 0 10*3/uL (ref 0.0–0.1)
Basophils Relative: 0 %
Eosinophils Absolute: 0 10*3/uL (ref 0.0–0.7)
Eosinophils Relative: 0 %
HCT: 32.5 % — ABNORMAL LOW (ref 36.0–46.0)
Hemoglobin: 10.4 g/dL — ABNORMAL LOW (ref 12.0–15.0)
Lymphocytes Relative: 34 %
Lymphs Abs: 3.2 10*3/uL (ref 0.7–4.0)
MCH: 32.4 pg (ref 26.0–34.0)
MCHC: 32 g/dL (ref 30.0–36.0)
MCV: 101.2 fL — ABNORMAL HIGH (ref 78.0–100.0)
Monocytes Absolute: 0.9 10*3/uL (ref 0.1–1.0)
Monocytes Relative: 9 %
Neutro Abs: 5.4 10*3/uL (ref 1.7–7.7)
Neutrophils Relative %: 57 %
Platelets: 190 10*3/uL (ref 150–400)
RBC: 3.21 MIL/uL — ABNORMAL LOW (ref 3.87–5.11)
RDW: 13.5 % (ref 11.5–15.5)
WBC: 9.5 10*3/uL (ref 4.0–10.5)

## 2016-08-04 LAB — COMPREHENSIVE METABOLIC PANEL
ALT: 28 U/L (ref 14–54)
AST: 39 U/L (ref 15–41)
Albumin: 3.6 g/dL (ref 3.5–5.0)
Alkaline Phosphatase: 67 U/L (ref 38–126)
Anion gap: 9 (ref 5–15)
BUN: 53 mg/dL — ABNORMAL HIGH (ref 6–20)
CO2: 28 mmol/L (ref 22–32)
Calcium: 9.2 mg/dL (ref 8.9–10.3)
Chloride: 98 mmol/L — ABNORMAL LOW (ref 101–111)
Creatinine, Ser: 1.98 mg/dL — ABNORMAL HIGH (ref 0.44–1.00)
GFR calc Af Amer: 27 mL/min — ABNORMAL LOW (ref >60)
GFR calc non Af Amer: 24 mL/min — ABNORMAL LOW (ref >60)
Glucose, Bld: 124 mg/dL — ABNORMAL HIGH (ref 65–99)
Potassium: 5.3 mmol/L — ABNORMAL HIGH (ref 3.5–5.1)
Sodium: 135 mmol/L (ref 135–145)
Total Bilirubin: 0.5 mg/dL (ref 0.3–1.2)
Total Protein: 6.2 g/dL — ABNORMAL LOW (ref 6.5–8.1)

## 2016-08-04 LAB — CBG MONITORING, ED: Glucose-Capillary: 112 mg/dL — ABNORMAL HIGH (ref 65–99)

## 2016-08-04 LAB — PHOSPHORUS: Phosphorus: 3.6 mg/dL (ref 2.5–4.6)

## 2016-08-04 LAB — GLUCOSE, CAPILLARY
Glucose-Capillary: 230 mg/dL — ABNORMAL HIGH (ref 65–99)
Glucose-Capillary: 202 mg/dL — ABNORMAL HIGH (ref 65–99)

## 2016-08-04 LAB — MRSA PCR SCREENING: MRSA by PCR: POSITIVE — AB

## 2016-08-04 LAB — I-STAT TROPONIN, ED: Troponin i, poc: 0.93 ng/mL (ref 0.00–0.08)

## 2016-08-04 LAB — TROPONIN I
Troponin I: 0.83 ng/mL (ref <0.03)
Troponin I: 0.62 ng/mL (ref <0.03)

## 2016-08-04 LAB — MAGNESIUM: Magnesium: 2.4 mg/dL (ref 1.7–2.4)

## 2016-08-04 MED ORDER — ONDANSETRON HCL 4 MG PO TABS: 4.0000 mg | ORAL_TABLET | Freq: Four times a day (QID) | ORAL | Status: DC | PRN

## 2016-08-04 MED ORDER — PANTOPRAZOLE SODIUM 40 MG PO TBEC: 40.0000 mg | DELAYED_RELEASE_TABLET | Freq: Every day | ORAL | Status: DC | PRN

## 2016-08-04 MED ORDER — HYDRALAZINE HCL 20 MG/ML IJ SOLN
10.0000 mg | Freq: Three times a day (TID) | INTRAMUSCULAR | Status: DC | PRN
  Administered 2016-08-06: 10 mg via INTRAVENOUS
  Filled 2016-08-04: qty 1

## 2016-08-04 MED ORDER — ASPIRIN 81 MG PO CHEW
243.0000 mg | CHEWABLE_TABLET | Freq: Once | ORAL | Status: AC
  Administered 2016-08-04: 243 mg via ORAL
  Filled 2016-08-04: qty 3

## 2016-08-04 MED ORDER — ATORVASTATIN CALCIUM 40 MG PO TABS
40.0000 mg | ORAL_TABLET | Freq: Every day | ORAL | Status: DC
  Administered 2016-08-05: 40 mg via ORAL
  Filled 2016-08-04: qty 1

## 2016-08-04 MED ORDER — ASPIRIN 81 MG PO CHEW
324.0000 mg | CHEWABLE_TABLET | Freq: Every day | ORAL | Status: DC
  Administered 2016-08-05 – 2016-08-06 (×2): 324 mg via ORAL
  Filled 2016-08-04 (×2): qty 4

## 2016-08-04 MED ORDER — CHLORHEXIDINE GLUCONATE CLOTH 2 % EX PADS
6.0000 | MEDICATED_PAD | Freq: Every day | CUTANEOUS | Status: DC
  Administered 2016-08-05: 6 via TOPICAL

## 2016-08-04 MED ORDER — CLOPIDOGREL BISULFATE 75 MG PO TABS
75.0000 mg | ORAL_TABLET | Freq: Every morning | ORAL | Status: DC
  Administered 2016-08-04 – 2016-08-06 (×3): 75 mg via ORAL
  Filled 2016-08-04 (×3): qty 1

## 2016-08-04 MED ORDER — SODIUM CHLORIDE 0.9 % IV SOLN
INTRAVENOUS | Status: DC
  Administered 2016-08-04 (×2): via INTRAVENOUS

## 2016-08-04 MED ORDER — ACETAMINOPHEN 650 MG RE SUPP: 650.0000 mg | Freq: Four times a day (QID) | RECTAL | Status: DC | PRN

## 2016-08-04 MED ORDER — MUPIROCIN 2 % EX OINT
1.0000 "application " | TOPICAL_OINTMENT | Freq: Two times a day (BID) | CUTANEOUS | Status: DC
  Administered 2016-08-04 – 2016-08-06 (×4): 1 via NASAL
  Filled 2016-08-04 (×3): qty 22

## 2016-08-04 MED ORDER — SODIUM CHLORIDE 0.9 % IV BOLUS (SEPSIS)
500.0000 mL | Freq: Once | INTRAVENOUS | Status: AC
  Administered 2016-08-04: 500 mL via INTRAVENOUS

## 2016-08-04 MED ORDER — ACETAMINOPHEN 325 MG PO TABS
650.0000 mg | ORAL_TABLET | Freq: Four times a day (QID) | ORAL | Status: DC | PRN
  Administered 2016-08-04 – 2016-08-05 (×3): 650 mg via ORAL
  Filled 2016-08-04 (×3): qty 2

## 2016-08-04 MED ORDER — NITROGLYCERIN 0.4 MG SL SUBL: 0.4000 mg | SUBLINGUAL_TABLET | SUBLINGUAL | Status: DC | PRN

## 2016-08-04 MED ORDER — DEXTROSE 5 % IV SOLN
1.0000 g | INTRAVENOUS | Status: DC
  Administered 2016-08-04: 1 g via INTRAVENOUS
  Filled 2016-08-04 (×2): qty 10

## 2016-08-04 MED ORDER — CARVEDILOL 25 MG PO TABS
25.0000 mg | ORAL_TABLET | Freq: Two times a day (BID) | ORAL | Status: DC
  Administered 2016-08-04 – 2016-08-06 (×5): 25 mg via ORAL
  Filled 2016-08-04 (×2): qty 1
  Filled 2016-08-04: qty 2
  Filled 2016-08-04 (×2): qty 1

## 2016-08-04 MED ORDER — ASPIRIN EC 81 MG PO TBEC: 81.0000 mg | DELAYED_RELEASE_TABLET | Freq: Every evening | ORAL | Status: DC

## 2016-08-04 MED ORDER — CITALOPRAM HYDROBROMIDE 20 MG PO TABS
20.0000 mg | ORAL_TABLET | Freq: Every morning | ORAL | Status: DC
  Administered 2016-08-04 – 2016-08-06 (×3): 20 mg via ORAL
  Filled 2016-08-04: qty 1
  Filled 2016-08-04: qty 2
  Filled 2016-08-04: qty 1

## 2016-08-04 MED ORDER — VITAMIN B-12 1000 MCG PO TABS
2500.0000 ug | ORAL_TABLET | Freq: Every day | ORAL | Status: DC
  Administered 2016-08-05 – 2016-08-06 (×2): 2500 ug via ORAL
  Filled 2016-08-04 (×2): qty 3

## 2016-08-04 MED ORDER — ZOLPIDEM TARTRATE 5 MG PO TABS
5.0000 mg | ORAL_TABLET | Freq: Once | ORAL | Status: AC
  Administered 2016-08-04: 5 mg via ORAL
  Filled 2016-08-04: qty 1

## 2016-08-04 MED ORDER — SODIUM CHLORIDE 0.9% FLUSH
3.0000 mL | Freq: Two times a day (BID) | INTRAVENOUS | Status: DC
  Administered 2016-08-05 – 2016-08-06 (×3): 3 mL via INTRAVENOUS

## 2016-08-04 MED ORDER — ONDANSETRON HCL 4 MG/2ML IJ SOLN
4.0000 mg | Freq: Four times a day (QID) | INTRAMUSCULAR | Status: DC | PRN
  Administered 2016-08-05 – 2016-08-06 (×2): 4 mg via INTRAVENOUS
  Filled 2016-08-04 (×2): qty 2

## 2016-08-04 MED ORDER — INSULIN ASPART 100 UNIT/ML ~~LOC~~ SOLN
0.0000 [IU] | Freq: Three times a day (TID) | SUBCUTANEOUS | Status: DC
  Administered 2016-08-04: 5 [IU] via SUBCUTANEOUS
  Administered 2016-08-05: 3 [IU] via SUBCUTANEOUS
  Administered 2016-08-05: 2 [IU] via SUBCUTANEOUS
  Administered 2016-08-05 – 2016-08-06 (×2): 3 [IU] via SUBCUTANEOUS

## 2016-08-04 NOTE — ED Notes (Signed)
hospitalist at bedside

## 2016-08-04 NOTE — ED Notes (Signed)
ED Provider at bedside. 

## 2016-08-04 NOTE — ED Notes (Signed)
Breakfast tray ordered 

## 2016-08-04 NOTE — Progress Notes (Signed)
Pharmacy Antibiotic Note  Andrea Kirk is a 76 y.o. female admitted on 08/04/2016 with UTI.  Pharmacy has been consulted for Rocephin dosing.  Plan: Rocephin 1g IV Q24H.  Pharmacy will sign off.  Height: 5\' 5"  (165.1 cm) Weight: 175 lb (79.4 kg) IBW/kg (Calculated) : 57  Temp (24hrs), Avg:100 F (37.8 C), Min:100 F (37.8 C), Max:100 F (37.8 C)   Recent Labs Lab 08/04/16 0512  WBC 9.5  CREATININE 1.98*    Estimated Creatinine Clearance: 25.6 mL/min (A) (by C-G formula based on SCr of 1.98 mg/dL (H)).    Allergies  Allergen Reactions  . Codeine Other (See Comments)    Abnormal behavior  . Latex Other (See Comments)    tears skin  . Morphine And Related Other (See Comments)    Affects BP and blood sugar.    Thank you for allowing pharmacy to be a part of this patient's care.  Wynona Neat, PharmD, BCPS  08/04/2016 7:57 AM

## 2016-08-04 NOTE — ED Notes (Signed)
Lunch tray at bedside. ?

## 2016-08-04 NOTE — ED Provider Notes (Signed)
[place] DEPT Provider Note   CSN: [id number] Arrival date & time: 08/04/16  3244 By signing my name below, I, Dyke Brackett, attest that this documentation has been prepared under the direction and in the presence of Kolina Kube, Gwenyth Allegra, MD . Electronically Signed: Dyke Brackett, Scribe. 08/04/2016. 3:22 AM.   History   Chief Complaint Chief Complaint  Patient presents with  . Fall  . Weakness   HPI Andrea Kirk is a 76 y.o. female with a history of CAD, DM and muscle spasms who presents to the Emergency Department complaining of sudden onset, constant sacral pain s/p fall tonight prior to arrival. Pt states she has felt jittery ever since her medications were changed from Robaxin to Flexeril and believes this is what caused her to fall. Pt also reports a hard knot to her posterior head. No OTC treatments tried for these symptoms PTA.  No alleviating or modifying factors noted. She denies any neck pain. Pt has no other acute complaints or associated symptoms at this time.    The history is provided by the patient. No language interpreter was used.   Past Medical History:  Diagnosis Date  . Anxiety    takes Celexa daily  . Back pain    occasionally  . Carotid artery occlusion   . Cataract    left and immature  . Coronary artery disease   . Depression   . Diabetes mellitus    takes Metformin and Glipizide daily  . Dizziness    takes Meclizine daily as needed  . GERD (gastroesophageal reflux disease)    takes Omeprazole daily as needed  . Headache(784.0)   . Hyperlipidemia    takes Atorvastatin daily  . Hypertension    takes Carvedilol daily  . Muscle spasm    takes Robaxin daily as needed  . Nausea    takes Phenergan daily as needed  . Pneumonia    hx of-in high school  . Restless leg    takes Requip daily as needed  . Seasonal allergies    takes Claritin daily as needed and Afrin as needed  . Shortness of breath    with exertion  . Urinary urgency      Patient Active Problem List   Diagnosis Date Noted  . Asymptomatic stenosis of right carotid artery 03/22/2016  . Diabetes mellitus ([place]) 05/02/2015  . Carotid stenosis 11/16/2013  . Aftercare following surgery of the circulatory system, [place] 05/11/2013  . Carotid artery disease ([place]) 04/16/2013  . Coronary atherosclerosis of native coronary artery 03/11/2013  . Essential hypertension, benign 03/11/2013  . Mixed hyperlipidemia 03/11/2013  . Occlusion and stenosis of carotid artery without mention of cerebral infarction 07/19/2011    Past Surgical History:  Procedure Laterality Date  . COLONOSCOPY WITH PROPOFOL N/A 03/10/2012   Procedure: COLONOSCOPY WITH PROPOFOL;  Surgeon: Garlan Fair, MD;  Location: [facility];  Service: Endoscopy;  Laterality: N/A;  . CORNEAL TRANSPLANT Right   . CORONARY ARTERY BYPASS GRAFT  1997   x 6  . CORONARY ARTERY BYPASS GRAFT  Jan. 1997  . ENDARTERECTOMY Left 04/16/2013   Procedure: Left Carotid Artery Endatarectomy with Resection of Redundant Internal Carotid Artery;  Surgeon: Rosetta Posner, MD;  Location: [facility];  Service: Vascular;  Laterality: Left;  . ENDARTERECTOMY Right 03/22/2016   Procedure: RIGHT ENDARTERECTOMY CAROTID;  Surgeon: Rosetta Posner, MD;  Location: [facility] OR;  Service: Vascular;  Laterality: Right;  . ESOPHAGOGASTRODUODENOSCOPY N/A 03/10/2012   Procedure: ESOPHAGOGASTRODUODENOSCOPY (EGD);  Surgeon: Garlan Fair, MD;  Location: [facility];  Service: Endoscopy;  Laterality: N/A;  . EYE SURGERY  March 12, 2001   CORNEA TRANSPLANT Right eye  . PATCH ANGIOPLASTY Right 03/22/2016   Procedure: PATCH ANGIOPLASTY;  Surgeon: Rosetta Posner, MD;  Location: [place];  Service: Vascular;  Laterality: Right;  . PR VEIN BYPASS GRAFT,AORTO-FEM-POP  1997  . SPINE SURGERY  march 2013   Back surgery  . TONSILLECTOMY    . TRIGGER FINGER RELEASE Left    thumb    OB History    No data available       Home Medications    Prior to Admission  medications   Medication Sig Start Date End Date Taking? Authorizing Provider  acetaminophen (TYLENOL) 500 MG tablet Take 1,000 mg by mouth every 6 (six) hours as needed for moderate pain. For pain   Yes [provider]  Ascorbic Acid (VITAMIN C) 1000 MG tablet Take 1,000 mg by mouth daily.   Yes [provider]  aspirin EC 81 MG tablet Take 81 mg by mouth every evening.    Yes [provider]  atorvastatin (LIPITOR) 40 MG tablet Take 40 mg by mouth daily at 6 PM.    Yes [provider]  Calcium Carbonate-Vitamin D (CALTRATE 600+D PO) Take 1 tablet by mouth daily.   Yes [provider]  carvedilol (COREG) 25 MG tablet TAKE 1 TABLET BY MOUTH 2  TIMES DAILY WITH A MEAL 06/28/16  Yes Jettie Booze, MD  citalopram (CELEXA) 20 MG tablet Take 20 mg by mouth every morning.    Yes [provider]  clopidogrel (PLAVIX) 75 MG tablet Take 75 mg by mouth every morning. Will stop 5 days prior to procedure   Yes [provider]  Cyanocobalamin (B-12) 2500 MCG TABS Take 2,500 mcg by mouth daily.   Yes [provider]  cyclobenzaprine (FLEXERIL) 5 MG tablet Take 5 mg by mouth 3 (three) times daily.   Yes [provider]  fish oil-omega-3 fatty acids 1000 MG capsule Take 1 g by mouth daily.   Yes [provider]  gabapentin (NEURONTIN) 300 MG capsule Take 300 mg by mouth 4 (four) times daily.  02/28/16  Yes [provider]  glipiZIDE (GLUCOTROL XL) 10 MG 24 hr tablet Take 10 mg by mouth daily with breakfast.    Yes [provider]  lisinopril (PRINIVIL,ZESTRIL) 20 MG tablet TAKE 1 TABLET BY MOUTH  DAILY. KEEP OFFICE VISIT. Patient taking differently: TAKE 1 TABLET BY MOUTH  DAILY AT NIGHT. KEEP OFFICE VISIT. 06/06/15  Yes Jettie Booze, MD  Magnesium 250 MG TABS Take 250 mg by mouth daily.   Yes [provider]  metFORMIN (GLUCOPHAGE) 1000 MG tablet Take 1,000 mg by mouth 2 (two) times  daily with a meal.  02/09/15  Yes [provider]  nitroGLYCERIN (NITROSTAT) 0.4 MG SL tablet Place 1 tablet (0.4 mg total) under the tongue every 5 (five) minutes as needed. Chest pain. Patient taking differently: Place 0.4 mg under the tongue every 5 (five) minutes as needed for chest pain.  09/28/13  Yes Jettie Booze, MD  pantoprazole (PROTONIX) 40 MG tablet Take 40 mg by mouth daily as needed (INDIGESTION).  01/10/15  Yes [provider]  HYDROcodone-acetaminophen (NORCO) 10-325 MG tablet Take 1 tablet by mouth 2 (two) times daily as needed for moderate pain. Patient not taking: Reported on 08/04/2016 03/22/16   Alvia Grove, PA-C  meclizine (ANTIVERT) 25 MG tablet Take 25 mg by mouth 2 (two) times daily as needed for dizziness. For dizziness     [provider]    Family History Family History  Problem Relation Age of Onset  . Heart attack Father   . Heart disease Father 55       Before age of 61  . Hyperlipidemia Father   . Heart disease Brother        Heart dissease before age 61  . Hyperlipidemia Brother   . Heart attack Paternal Grandfather   . Heart disease Paternal Grandfather     Social History Social History  Substance Use Topics  . Smoking status: Never Smoker  . Smokeless tobacco: Never Used  . Alcohol use No     Allergies   Codeine; Latex; and Morphine and related   Review of Systems Review of Systems All systems reviewed and are negative for acute change except as noted in the HPI.  Physical Exam Updated Vital Signs BP 105/90   Pulse 79   Temp 100 F (37.8 C) (Oral)   Resp 18   Ht 5\' 5"  (1.651 m)   Wt 79.4 kg (175 lb)   SpO2 94%   BMI 29.12 kg/m   Physical Exam  Constitutional: She is oriented to person, place, and time. She appears well-developed and well-nourished. No distress.  HENT:  Head: Normocephalic and atraumatic.  Right Ear: Hearing normal.  Left Ear: Hearing normal.  Nose: Nose normal.    Mouth/Throat: Oropharynx is clear and moist and mucous membranes are normal.  Eyes: Pupils are equal, round, and reactive to light. Conjunctivae and EOM are normal.  Neck: Normal range of motion. Neck supple.  Cardiovascular: Regular rhythm, S1 normal and S2 normal.  Exam reveals no gallop and no friction rub.   No murmur heard. Pulmonary/Chest: Effort normal and breath sounds normal. No respiratory distress. She exhibits no tenderness.  Abdominal: Soft. Normal appearance and bowel sounds are normal. There is no hepatosplenomegaly. There is no tenderness. There is no rebound, no guarding, no tenderness at McBurney's point and negative Murphy's sign. No hernia.  Musculoskeletal: Normal range of motion.  Neurological: She is alert and oriented to person, place, and time. She has normal strength. No cranial nerve deficit or sensory deficit. Coordination normal. GCS eye subscore is 4. GCS verbal subscore is 5. GCS motor subscore is 6.  Skin: Skin is warm, dry and intact. No rash noted. No cyanosis.  Psychiatric: She has a normal mood and affect. Her speech is normal and behavior is normal. Thought content normal.  Nursing note and vitals reviewed.  ED Treatments / Results  DIAGNOSTIC STUDIES:  Oxygen Saturation is 98% on RA, normal by my interpretation.    COORDINATION OF CARE:  3:25 AM Discussed treatment plan with pt at bedside and pt agreed to plan.   Labs (all labs ordered are listed, but only abnormal results are displayed) Labs Reviewed  CBC WITH DIFFERENTIAL/PLATELET - Abnormal; Notable for the following:       Result Value   RBC 3.21 (*)    Hemoglobin 10.4 (*)    HCT 32.5 (*)    MCV 101.2 (*)    All other components within normal limits  COMPREHENSIVE METABOLIC PANEL - Abnormal; Notable for the following:    Potassium 5.3 (*)    Chloride 98 (*)    Glucose, Bld 124 (*)    BUN 53 (*)    Creatinine, Ser 1.98 (*)  Total Protein 6.2 (*)    GFR calc non Af Amer 24 (*)    GFR  calc Af Amer 27 (*)    All other components within normal limits  URINALYSIS, ROUTINE W REFLEX MICROSCOPIC - Abnormal; Notable for the following:    Protein, ur 30 (*)    Squamous Epithelial / LPF 0-5 (*)    All other components within normal limits    EKG  EKG Interpretation  Date/Time:  Sunday August 04 2016 03:17:15 EDT Ventricular Rate:  62 PR Interval:  150 QRS Duration: 88 QT Interval:  462 QTC Calculation: 468 R Axis:   60 Text Interpretation:  Sinus rhythm with Premature atrial complexes Nonspecific ST and T wave abnormality Abnormal ECG Confirmed by Orpah Greek [phone]) on 08/04/2016 6:51:36 AM       Radiology Dg Lumbar Spine Complete  Result Date: 08/04/2016 CLINICAL DATA:  Persistent low back and sacral pain after falling tonight. Recent medication change. EXAM: LUMBAR SPINE - COMPLETE 4+ VIEW COMPARISON:  None. FINDINGS: Posterior lumbar decompression with instrumented fusion at L4-5 with intervertebral fusion cage. Hardware is intact. Lumbar vertebrae are normal in height. No evidence of acute lumbar spine fracture. No spondylolisthesis. No bone lesion or bony destruction. IMPRESSION: Negative for acute lumbar spine fracture. Electronically Signed   By: Andreas Newport M.D.   On: 08/04/2016 05:06   Dg Pelvis 1-2 Views  Result Date: 08/04/2016 CLINICAL DATA:  Persistent pain after falling tonight. EXAM: PELVIS - 1-2 VIEW COMPARISON:  None. FINDINGS: There is no evidence of pelvic fracture or diastasis. No pelvic bone lesions are seen. IMPRESSION: Negative. Electronically Signed   By: Andreas Newport M.D.   On: 08/04/2016 05:06   Dg Sacrum/coccyx  Result Date: 08/04/2016 CLINICAL DATA:  Persistent pain after falling tonight EXAM: SACRUM AND COCCYX - 2+ VIEW COMPARISON:  None. FINDINGS: The sacrum, coccyx and sacroiliac joints appear intact. No evidence of acute fracture or diastases. IMPRESSION: Negative. Electronically Signed   By: Andreas Newport M.D.   On:  08/04/2016 05:07   Ct Head Wo Contrast  Result Date: 08/04/2016 CLINICAL DATA:  76 y/o  F; weakness and fall. EXAM: CT HEAD WITHOUT CONTRAST CT CERVICAL SPINE WITHOUT CONTRAST TECHNIQUE: Multidetector CT imaging of the head and cervical spine was performed following the standard protocol without intravenous contrast. Multiplanar CT image reconstructions of the cervical spine were also generated. COMPARISON:  05/29/2016 MRI of the head. FINDINGS: CT HEAD FINDINGS Brain: No evidence of acute infarction, hemorrhage, hydrocephalus, extra-axial collection or mass lesion/mass effect. Mild chronic microvascular ischemic changes of white matter and mild brain parenchymal volume loss. Vascular: Moderate calcific atherosclerosis of carotid siphons. Skull: Right parietal scalp soft tissue thickening compatible with contusion. No displaced calvarial fracture. Sinuses/Orbits: Minimal bilateral maxillary sinus mucosal thickening. Otherwise negative. Other: Right intra-ocular lens replacement. CT CERVICAL SPINE FINDINGS Alignment: Straightening of cervical lordosis and grade 1 C7-T1 anterolisthesis. Skull base and vertebrae: No acute cervical spine fracture. No primary bone lesion or focal pathologic process. Soft tissues and spinal canal: Postsurgical changes in the bilateral neck related to carotid endarterectomy. Disc levels: Advanced discogenic degenerative changes of the cervical spine with multilevel severe loss of disc space height. Mild facet arthrosis. Discogenic degenerative changes are greatest at the C6-7 level where a prominent disc osteophyte complex results in moderate canal stenosis. Upper chest: Negative. Other: Partially visualize comminuted fracture of the medial left clavicle. IMPRESSION: CT head: 1. Right parietal scalp soft tissue thickening probably representing a small contusion.  No displaced calvarial fracture. 2. No acute intracranial abnormality. 3. Mild chronic microvascular ischemic changes and  mild parenchymal volume loss of the brain. CT cervical spine: 1. No acute fracture or dislocation of the cervical spine. 2. Partially visualized acute comminuted fracture of the medial left clavicle. 3. Advanced discogenic degenerative changes of the cervical spine greatest at the C6-7 level. Electronically Signed   By: Kristine Garbe M.D.   On: 08/04/2016 05:48   Ct Cervical Spine Wo Contrast  Result Date: 08/04/2016 CLINICAL DATA:  76 y/o  F; weakness and fall. EXAM: CT HEAD WITHOUT CONTRAST CT CERVICAL SPINE WITHOUT CONTRAST TECHNIQUE: Multidetector CT imaging of the head and cervical spine was performed following the standard protocol without intravenous contrast. Multiplanar CT image reconstructions of the cervical spine were also generated. COMPARISON:  05/29/2016 MRI of the head. FINDINGS: CT HEAD FINDINGS Brain: No evidence of acute infarction, hemorrhage, hydrocephalus, extra-axial collection or mass lesion/mass effect. Mild chronic microvascular ischemic changes of white matter and mild brain parenchymal volume loss. Vascular: Moderate calcific atherosclerosis of carotid siphons. Skull: Right parietal scalp soft tissue thickening compatible with contusion. No displaced calvarial fracture. Sinuses/Orbits: Minimal bilateral maxillary sinus mucosal thickening. Otherwise negative. Other: Right intra-ocular lens replacement. CT CERVICAL SPINE FINDINGS Alignment: Straightening of cervical lordosis and grade 1 C7-T1 anterolisthesis. Skull base and vertebrae: No acute cervical spine fracture. No primary bone lesion or focal pathologic process. Soft tissues and spinal canal: Postsurgical changes in the bilateral neck related to carotid endarterectomy. Disc levels: Advanced discogenic degenerative changes of the cervical spine with multilevel severe loss of disc space height. Mild facet arthrosis. Discogenic degenerative changes are greatest at the C6-7 level where a prominent disc osteophyte complex  results in moderate canal stenosis. Upper chest: Negative. Other: Partially visualize comminuted fracture of the medial left clavicle. IMPRESSION: CT head: 1. Right parietal scalp soft tissue thickening probably representing a small contusion. No displaced calvarial fracture. 2. No acute intracranial abnormality. 3. Mild chronic microvascular ischemic changes and mild parenchymal volume loss of the brain. CT cervical spine: 1. No acute fracture or dislocation of the cervical spine. 2. Partially visualized acute comminuted fracture of the medial left clavicle. 3. Advanced discogenic degenerative changes of the cervical spine greatest at the C6-7 level. Electronically Signed   By: Kristine Garbe M.D.   On: 08/04/2016 05:48    Procedures Procedures (including critical care time)  Medications Ordered in ED Medications  sodium chloride 0.9 % bolus 500 mL (not administered)     Initial Impression / Assessment and Plan / ED Course  I have reviewed the triage vital signs and the nursing notes.  Pertinent labs & imaging results that were available during my care of the patient were reviewed by me and considered in my medical decision making (see chart for details).     Patient presents to the emergency department for evaluation after a fall. Patient lives alone. She reports that she bent over to pick something up and fell, landing on her backside. She is complaining of tailbone pain. She did hit her head, has a contusion on the right side of her scalp. She appears slightly confused, it is unclear what her baseline is. CT head and cervical spine negative. X-ray lumbar spine, pelvis, coccyx no acute fracture.  Patient has a history of chronic back pain. She reports that she has been normally treated with Robaxin, just had her muscle relaxer changed to Flexeril in the last few days. Since she has been taking the  Flexeril she has been "jittery". I suspect that some of her current confusion and  generalized weakness is secondary to the Flexeril. Blood work reveals acute kidney injury, likely secondary to dehydration. Will admit patient to hospital for further management of acute kidney injury and monitoring of mental status changes off Flexeril.  Final Clinical Impressions(s) / ED Diagnoses   Final diagnoses:  Fall, initial encounter  Dehydration  AKI (acute kidney injury) ([place])  Generalized weakness    New Prescriptions New Prescriptions   No medications on file   I personally performed the services described in this documentation, which was scribed in my presence. The recorded information has been reviewed and is accurate.    Orpah Greek, MD 08/04/16 [phone]

## 2016-08-04 NOTE — Progress Notes (Signed)
CRITICAL VALUE ALERT  Critical Value: troponin  Date & Time Notied:  08/04/16 1520  Provider Notified: Dr. Aggie Moats  Orders Received/Actions taken: no new orders

## 2016-08-04 NOTE — ED Triage Notes (Signed)
Pt in from home via [facility] after weakness and fall. Pt was found supine, c/o sacral pain when they arrived. Pt states she has muscle spasms, was switched from Robaxin to Flexeril, took 3 days worth of doses, became jittery, fell last night. Pt a&ox3, denies LOC or injury. MAE's equally

## 2016-08-04 NOTE — H&P (Addendum)
[facility] History and Physical  [name] HGD:[id number] DOB: [birth year] DOA: 08/04/2016  Referring physician:  PCP: Aura Dials, MD   Chief Complaint: Fall/weakness  HPI: Andrea Kirk is a 76 y.o. female  past medical history significant for back pain, diabetes, dizziness, hyperlipidemia and chronic falls presents emergency room with fall weakness. Patient states she was in her normal health for the last several days. States she was recently changed from Robaxin to Flexeril. Patient feels this made her weaker. Patient had a fall on the morning of admission but was able to get up under her own power but felt weak. Patient soon after standing fell again. Called EMS using her medical alert bracelet.  Patient says it's been more difficult to urinate recently. Denies dysuria. No blood in urine. No fevers. No chest pain no cough.  Hospital course: Labs showed acute kidney injury. Patient given a bolus of fluid due to clinical presentation of dehydration. Hospitalist consulted for admission.   Review of Systems:  As per HPI otherwise 10 point review of systems negative.    Past Medical History:  Diagnosis Date  . Anxiety    takes Celexa daily  . Back pain    occasionally  . Carotid artery occlusion   . Cataract    left and immature  . Coronary artery disease   . Depression   . Diabetes mellitus    takes Metformin and Glipizide daily  . Dizziness    takes Meclizine daily as needed  . GERD (gastroesophageal reflux disease)    takes Omeprazole daily as needed  . Headache(784.0)   . Hyperlipidemia    takes Atorvastatin daily  . Hypertension    takes Carvedilol daily  . Muscle spasm    takes Robaxin daily as needed  . Nausea    takes Phenergan daily as needed  . Pneumonia    hx of-in high school  . Restless leg    takes Requip daily as needed  . Seasonal allergies    takes Claritin daily as needed and Afrin as needed  . Shortness of breath    with exertion    . Urinary urgency    Past Surgical History:  Procedure Laterality Date  . COLONOSCOPY WITH PROPOFOL N/A 03/10/2012   Procedure: COLONOSCOPY WITH PROPOFOL;  Surgeon: Garlan Fair, MD;  Location: [facility];  Service: Endoscopy;  Laterality: N/A;  . CORNEAL TRANSPLANT Right   . CORONARY ARTERY BYPASS GRAFT  1997   x 6  . CORONARY ARTERY BYPASS GRAFT  Jan. 1997  . ENDARTERECTOMY Left 04/16/2013   Procedure: Left Carotid Artery Endatarectomy with Resection of Redundant Internal Carotid Artery;  Surgeon: Rosetta Posner, MD;  Location: [place];  Service: Vascular;  Laterality: Left;  . ENDARTERECTOMY Right 03/22/2016   Procedure: RIGHT ENDARTERECTOMY CAROTID;  Surgeon: Rosetta Posner, MD;  Location[facility];  Service: Vascular;  Laterality: Right;  . ESOPHAGOGASTRODUODENOSCOPY N/A 03/10/2012   Procedure: ESOPHAGOGASTRODUODENOSCOPY (EGD);  Surgeon: Garlan Fair, MD;  Location: [facility];  Service: Endoscopy;  Laterality: N/A;  . EYE SURGERY  March 12, 2001   CORNEA TRANSPLANT Right eye  . PATCH ANGIOPLASTY Right 03/22/2016   Procedure: PATCH ANGIOPLASTY;  Surgeon: Rosetta Posner, MD;  Location: [place];  Service: Vascular;  Laterality: Right;  . PR VEIN BYPASS GRAFT,AORTO-FEM-POP  1997  . SPINE SURGERY  march 2013   Back surgery  . TONSILLECTOMY    . TRIGGER FINGER RELEASE Left    thumb  Social History:  reports that she has never smoked. She has never used smokeless tobacco. She reports that she does not drink alcohol or use drugs.  Allergies  Allergen Reactions  . Codeine Other (See Comments)    Abnormal behavior  . Latex Other (See Comments)    tears skin  . Morphine And Related Other (See Comments)    Affects BP and blood sugar.    Family History  Problem Relation Age of Onset  . Heart attack Father   . Heart disease Father 30       Before age of 48  . Hyperlipidemia Father   . Heart disease Brother        Heart dissease before age 65  . Hyperlipidemia Brother   . Heart  attack Paternal Grandfather   . Heart disease Paternal Grandfather      Prior to Admission medications   Medication Sig Start Date End Date Taking? Authorizing Provider  acetaminophen (TYLENOL) 500 MG tablet Take 1,000 mg by mouth every 6 (six) hours as needed for moderate pain. For pain   Yes [provider]  Ascorbic Acid (VITAMIN C) 1000 MG tablet Take 1,000 mg by mouth daily.   Yes [provider]  aspirin EC 81 MG tablet Take 81 mg by mouth every evening.    Yes [provider]  atorvastatin (LIPITOR) 40 MG tablet Take 40 mg by mouth daily at 6 PM.    Yes [provider]  Calcium Carbonate-Vitamin D (CALTRATE 600+D PO) Take 1 tablet by mouth daily.   Yes [provider]  carvedilol (COREG) 25 MG tablet TAKE 1 TABLET BY MOUTH 2  TIMES DAILY WITH A MEAL 06/28/16  Yes Jettie Booze, MD  citalopram (CELEXA) 20 MG tablet Take 20 mg by mouth every morning.    Yes [provider]  clopidogrel (PLAVIX) 75 MG tablet Take 75 mg by mouth every morning. Will stop 5 days prior to procedure   Yes [provider]  Cyanocobalamin (B-12) 2500 MCG TABS Take 2,500 mcg by mouth daily.   Yes [provider]  cyclobenzaprine (FLEXERIL) 5 MG tablet Take 5 mg by mouth 3 (three) times daily.   Yes [provider]  fish oil-omega-3 fatty acids 1000 MG capsule Take 1 g by mouth daily.   Yes [provider]  gabapentin (NEURONTIN) 300 MG capsule Take 300 mg by mouth 4 (four) times daily.  02/28/16  Yes [provider]  glipiZIDE (GLUCOTROL XL) 10 MG 24 hr tablet Take 10 mg by mouth daily with breakfast.    Yes [provider]  lisinopril (PRINIVIL,ZESTRIL) 20 MG tablet TAKE 1 TABLET BY MOUTH  DAILY. KEEP OFFICE VISIT. Patient taking differently: TAKE 1 TABLET BY MOUTH  DAILY AT NIGHT. KEEP OFFICE VISIT. 06/06/15  Yes Jettie Booze, MD  Magnesium 250 MG TABS Take 250 mg by mouth daily.   Yes [provider]  metFORMIN (GLUCOPHAGE) 1000 MG tablet Take 1,000 mg by mouth 2 (two) times daily with a meal.  02/09/15  Yes [provider]  nitroGLYCERIN (NITROSTAT) 0.4 MG SL tablet Place 1 tablet (0.4 mg total) under the tongue every 5 (five) minutes as needed. Chest pain. Patient taking differently: Place 0.4 mg under the tongue every 5 (five) minutes as needed for chest pain.  09/28/13  Yes Jettie Booze, MD  pantoprazole (PROTONIX) 40 MG tablet Take 40 mg by mouth daily as needed (INDIGESTION).  01/10/15  Yes [provider]  HYDROcodone-acetaminophen (NORCO) 10-325 MG tablet Take 1 tablet by mouth 2 (two) times daily as needed for moderate pain. Patient not taking: Reported on 08/04/2016 03/22/16   Alvia Grove, PA-C  meclizine (ANTIVERT) 25 MG tablet Take 25 mg by mouth 2 (two) times daily as needed for dizziness. For dizziness     [provider]   Physical Exam: Vitals:   08/04/16 0715 08/04/16 0730 08/04/16 0745 08/04/16 0757  BP: 105/90 104/67 103/64 117/71  Pulse: 79 69 77 69  Resp:   17 15  Temp:      TempSrc:      SpO2:  95% (!) 64% 98%  Weight:      Height:        Wt Readings from Last 3 Encounters:  08/04/16 79.4 kg (175 lb)  05/30/16 77.5 kg (170 lb 12.8 oz)  04/10/16 76.2 kg (168 lb)    General:  Appears calm and comfortable, A &Ox3 Eyes:  PERRL, EOMI, normal lids, iris ENT:  grossly normal hearing, lips & tongue Neck:  no LAD, masses or thyromegaly Cardiovascular:  RRR, no m/r/g. No LE edema.  Respiratory:  CTA bilaterally, no w/r/r. Normal respiratory effort. Abdomen:  soft, ntnd; no cva tenderness Skin:  no rash or induration seen on limited exam Musculoskeletal:  grossly normal tone BUE/BLE Psychiatric:  grossly normal mood and affect, speech fluent and appropriate Neurologic:  CN 2-12 grossly intact, moves all extremities in coordinated fashion.          Labs on Admission:  Basic Metabolic Panel:  Recent Labs Lab  08/04/16 0512  NA 135  K 5.3*  CL 98*  CO2 28  GLUCOSE 124*  BUN 53*  CREATININE 1.98*  CALCIUM 9.2   Liver Function Tests:  Recent Labs Lab 08/04/16 0512  AST 39  ALT 28  ALKPHOS 67  BILITOT 0.5  PROT 6.2*  ALBUMIN 3.6   No results for input(s): LIPASE, AMYLASE in the last 168 hours. No results for input(s): AMMONIA in the last 168 hours. CBC:  Recent Labs Lab 08/04/16 0512  WBC 9.5  NEUTROABS 5.4  HGB 10.4*  HCT 32.5*  MCV 101.2*  PLT 190   Cardiac Enzymes: No results for input(s): CKTOTAL, CKMB, CKMBINDEX, TROPONINI in the last 168 hours.  BNP (last 3 results) No results for input(s): BNP in the last 8760 hours.  ProBNP (last 3 results) No results for input(s): PROBNP in the last 8760 hours.   Serum creatinine: 1.98 mg/dL (H) 08/04/16 9179 Estimated creatinine clearance: 25.6 mL/min (A)  CBG: No results for input(s): GLUCAP in the last 168 hours.  Radiological Exams on Admission: Dg Lumbar Spine Complete  Result Date: 08/04/2016 CLINICAL DATA:  Persistent low back and sacral pain after falling tonight. Recent medication change. EXAM: LUMBAR SPINE - COMPLETE 4+ VIEW COMPARISON:  None. FINDINGS: Posterior lumbar decompression with instrumented fusion at L4-5 with intervertebral fusion cage. Hardware is intact. Lumbar vertebrae are normal in height. No evidence of acute lumbar spine fracture. No spondylolisthesis. No bone lesion or bony destruction. IMPRESSION: Negative for acute lumbar spine fracture. Electronically Signed   By: Andreas Newport M.D.   On: 08/04/2016 05:06   Dg Pelvis 1-2 Views  Result Date: 08/04/2016 CLINICAL DATA:  Persistent pain after falling tonight. EXAM: PELVIS - 1-2 VIEW COMPARISON:  None. FINDINGS: There is no evidence of pelvic fracture or diastasis. No pelvic bone lesions are seen. IMPRESSION: Negative. Electronically Signed   By: Valerie Roys.D.  On: 08/04/2016 05:06   Dg Sacrum/coccyx  Result Date:  08/04/2016 CLINICAL DATA:  Persistent pain after falling tonight EXAM: SACRUM AND COCCYX - 2+ VIEW COMPARISON:  None. FINDINGS: The sacrum, coccyx and sacroiliac joints appear intact. No evidence of acute fracture or diastases. IMPRESSION: Negative. Electronically Signed   By: Andreas Newport M.D.   On: 08/04/2016 05:07   Ct Head Wo Contrast  Result Date: 08/04/2016 CLINICAL DATA:  76 y/o  F; weakness and fall. EXAM: CT HEAD WITHOUT CONTRAST CT CERVICAL SPINE WITHOUT CONTRAST TECHNIQUE: Multidetector CT imaging of the head and cervical spine was performed following the standard protocol without intravenous contrast. Multiplanar CT image reconstructions of the cervical spine were also generated. COMPARISON:  05/29/2016 MRI of the head. FINDINGS: CT HEAD FINDINGS Brain: No evidence of acute infarction, hemorrhage, hydrocephalus, extra-axial collection or mass lesion/mass effect. Mild chronic microvascular ischemic changes of white matter and mild brain parenchymal volume loss. Vascular: Moderate calcific atherosclerosis of carotid siphons. Skull: Right parietal scalp soft tissue thickening compatible with contusion. No displaced calvarial fracture. Sinuses/Orbits: Minimal bilateral maxillary sinus mucosal thickening. Otherwise negative. Other: Right intra-ocular lens replacement. CT CERVICAL SPINE FINDINGS Alignment: Straightening of cervical lordosis and grade 1 C7-T1 anterolisthesis. Skull base and vertebrae: No acute cervical spine fracture. No primary bone lesion or focal pathologic process. Soft tissues and spinal canal: Postsurgical changes in the bilateral neck related to carotid endarterectomy. Disc levels: Advanced discogenic degenerative changes of the cervical spine with multilevel severe loss of disc space height. Mild facet arthrosis. Discogenic degenerative changes are greatest at the C6-7 level where a prominent disc osteophyte complex results in moderate canal stenosis. Upper chest: Negative.  Other: Partially visualize comminuted fracture of the medial left clavicle. IMPRESSION: CT head: 1. Right parietal scalp soft tissue thickening probably representing a small contusion. No displaced calvarial fracture. 2. No acute intracranial abnormality. 3. Mild chronic microvascular ischemic changes and mild parenchymal volume loss of the brain. CT cervical spine: 1. No acute fracture or dislocation of the cervical spine. 2. Partially visualized acute comminuted fracture of the medial left clavicle. 3. Advanced discogenic degenerative changes of the cervical spine greatest at the C6-7 level. Electronically Signed   By: Kristine Garbe M.D.   On: 08/04/2016 05:48   Ct Cervical Spine Wo Contrast  Result Date: 08/04/2016 CLINICAL DATA:  76 y/o  F; weakness and fall. EXAM: CT HEAD WITHOUT CONTRAST CT CERVICAL SPINE WITHOUT CONTRAST TECHNIQUE: Multidetector CT imaging of the head and cervical spine was performed following the standard protocol without intravenous contrast. Multiplanar CT image reconstructions of the cervical spine were also generated. COMPARISON:  05/29/2016 MRI of the head. FINDINGS: CT HEAD FINDINGS Brain: No evidence of acute infarction, hemorrhage, hydrocephalus, extra-axial collection or mass lesion/mass effect. Mild chronic microvascular ischemic changes of white matter and mild brain parenchymal volume loss. Vascular: Moderate calcific atherosclerosis of carotid siphons. Skull: Right parietal scalp soft tissue thickening compatible with contusion. No displaced calvarial fracture. Sinuses/Orbits: Minimal bilateral maxillary sinus mucosal thickening. Otherwise negative. Other: Right intra-ocular lens replacement. CT CERVICAL SPINE FINDINGS Alignment: Straightening of cervical lordosis and grade 1 C7-T1 anterolisthesis. Skull base and vertebrae: No acute cervical spine fracture. No primary bone lesion or focal pathologic process. Soft tissues and spinal canal: Postsurgical changes in  the bilateral neck related to carotid endarterectomy. Disc levels: Advanced discogenic degenerative changes of the cervical spine with multilevel severe loss of disc space height. Mild facet arthrosis. Discogenic degenerative changes are greatest at the C6-7 level where  a prominent disc osteophyte complex results in moderate canal stenosis. Upper chest: Negative. Other: Partially visualize comminuted fracture of the medial left clavicle. IMPRESSION: CT head: 1. Right parietal scalp soft tissue thickening probably representing a small contusion. No displaced calvarial fracture. 2. No acute intracranial abnormality. 3. Mild chronic microvascular ischemic changes and mild parenchymal volume loss of the brain. CT cervical spine: 1. No acute fracture or dislocation of the cervical spine. 2. Partially visualized acute comminuted fracture of the medial left clavicle. 3. Advanced discogenic degenerative changes of the cervical spine greatest at the C6-7 level. Electronically Signed   By: Kristine Garbe M.D.   On: 08/04/2016 05:48    EKG: Independently reviewed. NSR. No sTEMI  Assessment/Plan Principal Problem:   AKI (acute kidney injury) ([place]) Active Problems:   Essential hypertension, benign   Diabetes mellitus (HCC)  AKI Baseline Cr <1.0, Cr on admit 1.98 500 of normal saline given in the emergency room Gentle hydration overnight Checking magnesium and phosphorus Pre renal based on bun/cr ratio Mild high K, will recheck, should improve with IVF  AMS Likely from UTI and dehydration Holding poss related meds  Pyuria Ucx ordered Rocephin qd  Drug rxn Stop robaxin  Hypertension When necessary hydralazine 10 mg IV as needed for severe blood pressure Hold lisinopril  Elevated trop Check ECHO Serial trop Prn ntg  DM Hold metformin, glipizide SSI  Afib Cont coreg, asa  Back pain Hold flexeril, gabapentin  Vertigo Hold antivert  B12 def Cont B12  Chronic low  mag Check level Hold magox  GERD Cont PPI  CAD Prn ntg sl fro cp Cont asa dn plavix  Hyperlipidemia Continue statin tomorrow  Depression No SI/hi Cont celexa  Code Status: FULL  DVT Prophylaxis: heparin Family Communication: brother on phone Disposition Plan: Pending Improvement  Status: inpt tele  Elwin Mocha, MD Family Medicine [facility] [email or web address] Password [email or web address]

## 2016-08-04 NOTE — ED Notes (Signed)
Dr. Aggie Moats spoke with this RN and advised to give patient 81mg  asa for elevated troponin

## 2016-08-04 NOTE — Evaluation (Signed)
Physical Therapy Evaluation Patient Details Name: Andrea Kirk MRN: [id number] DOB: December 13, 1940 Today's Date: 08/04/2016   History of Present Illness  Andrea Kirk is a 76 y.o. female  past medical history significant for back pain, diabetes, dizziness, hyperlipidemia and chronic falls, is admitted 08/04/16 with falls and weakness  Clinical Impression  Patient presents with problems listed below.  Will benefit from acute PT to maximize functional mobility prior to discharge.  Patient was independent pta, living alone.  However, patient has been falling frequently with injury (fractures).  Today patient requiring assist for all mobility and gait.  Recommend ST-SNF at d/c for continued therapy with goal to return home safely.    Follow Up Recommendations SNF;Supervision for mobility/OOB    Equipment Recommendations  None recommended by PT    Recommendations for Other Services       Precautions / Restrictions Precautions Precautions: Fall Precaution Comments: multiple falls at home Restrictions Weight Bearing Restrictions: No      Mobility  Bed Mobility Overal bed mobility: Needs Assistance Bed Mobility: Sit to Supine       Sit to supine: Min assist   General bed mobility comments: Min assist to bring LE's onto bed to return to supine.  Transfers Overall transfer level: Needs assistance Equipment used: Rolling walker (2 wheeled) Transfers: Sit to/from Stand Sit to Stand: Min assist         General transfer comment: Verbal cues for hand placement.  Assist to rise to stance from chair and toilet, and for balance.  Ambulation/Gait Ambulation/Gait assistance: Min guard Ambulation Distance (Feet): 24 Feet (with 1 sitting rest break) Assistive device: Rolling walker (2 wheeled) Gait Pattern/deviations: Step-through pattern;Decreased stride length;Shuffle Gait velocity: decreased Gait velocity interpretation: Below normal speed for age/gender General Gait Details: Cues  for safe use of RW.  Min guard for safety.  Patient fatigued quickly.  Stairs            Wheelchair Mobility    Modified Rankin (Stroke Patients Only)       Balance Overall balance assessment: Needs assistance;History of Falls Sitting-balance support: No upper extremity supported;Feet supported Sitting balance-Leahy Scale: Good Sitting balance - Comments: Able to don socks in sitting position.   Standing balance support: Single extremity supported Standing balance-Leahy Scale: Poor Standing balance comment: UE support to maintain balance.                             Pertinent Vitals/Pain Pain Assessment: 0-10 Pain Score: 5  Pain Location: "tailbone" Pain Descriptors / Indicators: Sore Pain Intervention(s): Monitored during session;Repositioned    Home Living Family/patient expects to be discharged to:: Private residence Living Arrangements: Alone Available Help at Discharge: Family;Friend(s);Available PRN/intermittently Type of Home: Apartment Home Access: Level entry (on first floor)     Home Layout: One level Home Equipment: Walker - 2 wheels;Cane - single point      Prior Function Level of Independence: Independent with assistive device(s)         Comments: Patient uses cane for ambulation.  Patient drives. Works a part-time job.     Hand Dominance        Extremity/Trunk Assessment   Upper Extremity Assessment Upper Extremity Assessment: Generalized weakness    Lower Extremity Assessment Lower Extremity Assessment: Generalized weakness       Communication   Communication: No difficulties  Cognition Arousal/Alertness: Awake/alert Behavior During Therapy: WFL for tasks assessed/performed Overall Cognitive Status: Within Functional Limits  for tasks assessed                                        General Comments      Exercises     Assessment/Plan    PT Assessment Patient needs continued PT services  PT  Problem List Decreased strength;Decreased activity tolerance;Decreased balance;Decreased mobility;Decreased knowledge of use of DME;Pain       PT Treatment Interventions DME instruction;Gait training;Functional mobility training;Therapeutic activities;Patient/family education    PT Goals (Current goals can be found in the Care Plan section)  Acute Rehab PT Goals Patient Stated Goal: To move better PT Goal Formulation: With patient Time For Goal Achievement: 08/11/16 Potential to Achieve Goals: Good    Frequency Min 2X/week   Barriers to discharge Decreased caregiver support Patient lives alone with only prn assist.    Co-evaluation               AM-PAC PT "6 Clicks" Daily Activity  Outcome Measure Difficulty turning over in bed (including adjusting bedclothes, sheets and blankets)?: None Difficulty moving from lying on back to sitting on the side of the bed? : Total Difficulty sitting down on and standing up from a chair with arms (e.g., wheelchair, bedside commode, etc,.)?: Total Help needed moving to and from a bed to chair (including a wheelchair)?: A Little Help needed walking in hospital room?: A Little Help needed climbing 3-5 steps with a railing? : A Little 6 Click Score: 15    End of Session Equipment Utilized During Treatment: Gait belt Activity Tolerance: Patient limited by pain;Patient limited by fatigue Patient left: in bed;with call bell/phone within reach Nurse Communication: Mobility status PT Visit Diagnosis: Unsteadiness on feet (R26.81);Other abnormalities of gait and mobility (R26.89);Repeated falls (R29.6);Muscle weakness (generalized) (M62.81);Pain Pain - part of body:  ("Tailbone")    Time: 8022-3361 PT Time Calculation (min) (ACUTE ONLY): 25 min   Charges:   PT Evaluation $PT Eval Moderate Complexity: 1 Procedure PT Treatments $Gait Training: 8-22 mins   PT G Codes:        Carita Pian. Sanjuana Kava, [facility] Acute Rehab Services Pager  [phone]   Despina Pole 08/04/2016, 8:20 PM

## 2016-08-05 ENCOUNTER — Inpatient Hospital Stay (HOSPITAL_BASED_OUTPATIENT_CLINIC_OR_DEPARTMENT_OTHER): Payer: Medicare Other

## 2016-08-05 DIAGNOSIS — I10 Essential (primary) hypertension: Secondary | ICD-10-CM | POA: Diagnosis not present

## 2016-08-05 DIAGNOSIS — R531 Weakness: Secondary | ICD-10-CM

## 2016-08-05 DIAGNOSIS — E860 Dehydration: Secondary | ICD-10-CM

## 2016-08-05 DIAGNOSIS — E1159 Type 2 diabetes mellitus with other circulatory complications: Secondary | ICD-10-CM | POA: Diagnosis not present

## 2016-08-05 DIAGNOSIS — N179 Acute kidney failure, unspecified: Secondary | ICD-10-CM | POA: Diagnosis present

## 2016-08-05 DIAGNOSIS — W19XXXA Unspecified fall, initial encounter: Secondary | ICD-10-CM | POA: Diagnosis not present

## 2016-08-05 DIAGNOSIS — E875 Hyperkalemia: Secondary | ICD-10-CM | POA: Diagnosis not present

## 2016-08-05 HISTORY — DX: Acute kidney failure, unspecified: N17.9

## 2016-08-05 LAB — ECHOCARDIOGRAM COMPLETE
E decel time: 165 msec
E/e' ratio: 12.56
FS: 32 % (ref 28–44)
Height: 65 in
IVS/LV PW RATIO, ED: 0.89
LA ID, A-P, ES: 46 mm
LA diam end sys: 46 mm
LA diam index: 2.49 cm/m2
LA vol A4C: 74.2 ml
LA vol index: 41.8 mL/m2
LA vol: 77.3 mL
LV E/e' medial: 12.56
LV E/e'average: 12.56
LV PW d: 11.3 mm — AB (ref 0.6–1.1)
LV e' LATERAL: 8.92 cm/s
LVOT SV: 71 mL
LVOT VTI: 28 cm
LVOT area: 2.54 cm2
LVOT diameter: 18 mm
LVOT peak grad rest: 7 mmHg
LVOT peak vel: 134 cm/s
MV Dec: 165
MV Peak grad: 5 mmHg
MV pk A vel: 89.7 m/s
MV pk E vel: 112 m/s
RV sys press: 70 mmHg
Reg peak vel: 409 cm/s
TAPSE: 17.5 mm
TDI e' lateral: 8.92
TDI e' medial: 5.98
TR max vel: 409 cm/s
Weight: 2720 oz

## 2016-08-05 LAB — CBC
HCT: 33.1 % — ABNORMAL LOW (ref 36.0–46.0)
Hemoglobin: 10.4 g/dL — ABNORMAL LOW (ref 12.0–15.0)
MCH: 32.6 pg (ref 26.0–34.0)
MCHC: 31.4 g/dL (ref 30.0–36.0)
MCV: 103.8 fL — ABNORMAL HIGH (ref 78.0–100.0)
Platelets: 190 10*3/uL (ref 150–400)
RBC: 3.19 MIL/uL — ABNORMAL LOW (ref 3.87–5.11)
RDW: 13.8 % (ref 11.5–15.5)
WBC: 6 10*3/uL (ref 4.0–10.5)

## 2016-08-05 LAB — BASIC METABOLIC PANEL
Anion gap: 6 (ref 5–15)
BUN: 30 mg/dL — ABNORMAL HIGH (ref 6–20)
CO2: 29 mmol/L (ref 22–32)
Calcium: 9.2 mg/dL (ref 8.9–10.3)
Chloride: 105 mmol/L (ref 101–111)
Creatinine, Ser: 0.87 mg/dL (ref 0.44–1.00)
GFR calc Af Amer: >60 mL/min (ref >60)
GFR calc non Af Amer: >60 mL/min (ref >60)
Glucose, Bld: 169 mg/dL — ABNORMAL HIGH (ref 65–99)
Potassium: 5.2 mmol/L — ABNORMAL HIGH (ref 3.5–5.1)
Sodium: 140 mmol/L (ref 135–145)

## 2016-08-05 LAB — GLUCOSE, CAPILLARY
Glucose-Capillary: 158 mg/dL — ABNORMAL HIGH (ref 65–99)
Glucose-Capillary: 158 mg/dL — ABNORMAL HIGH (ref 65–99)
Glucose-Capillary: 149 mg/dL — ABNORMAL HIGH (ref 65–99)
Glucose-Capillary: 182 mg/dL — ABNORMAL HIGH (ref 65–99)

## 2016-08-05 LAB — POTASSIUM: Potassium: 5.3 mmol/L — ABNORMAL HIGH (ref 3.5–5.1)

## 2016-08-05 LAB — URINE CULTURE

## 2016-08-05 LAB — TROPONIN I: Troponin I: 0.52 ng/mL (ref <0.03)

## 2016-08-05 MED ORDER — METHOCARBAMOL 500 MG PO TABS
500.0000 mg | ORAL_TABLET | Freq: Three times a day (TID) | ORAL | Status: DC | PRN
  Administered 2016-08-05 (×2): 500 mg via ORAL
  Filled 2016-08-05 (×2): qty 1

## 2016-08-05 MED ORDER — SODIUM POLYSTYRENE SULFONATE 15 GM/60ML PO SUSP
15.0000 g | Freq: Once | ORAL | Status: AC
  Administered 2016-08-05: 15 g via ORAL
  Filled 2016-08-05: qty 60

## 2016-08-05 NOTE — Clinical Social Work Note (Signed)
CSW contacted by MD that patient's potassium is up. She will be given medication for this and should be able to discharge tomorrow. Call made to [place] at [facility] and provided update. CSW will continue to follow and provided information and support to patient and facilitate discharge to SNF when medically stable.  Najeh Credit Givens, MSW, LCSW Licensed Clinical Social Worker [place] [phone]

## 2016-08-05 NOTE — Clinical Social Work Note (Signed)
Clinical Social Work Assessment  Patient Details  Name: Andrea Kirk MRN: [id number] Date of Birth: 1940-03-29  Date of referral:  08/05/16               Reason for consult:  Facility Placement                Permission sought to share information with:  Family Supports Permission granted to share information::  Yes, Verbal Permission Granted  Name::     Dr. Marylu Lund Tracey  Agency::     Relationship::  Brother  Contact Information:  [phone]  Housing/Transportation Living arrangements for the past 2 months:  Apartment Source of Information:  Patient Patient Interpreter Needed:  None Criminal Activity/Legal Involvement Pertinent to Current Situation/Hospitalization:  No - Comment as needed Significant Relationships:  Siblings Lives with:  Self Do you feel safe going back to the place where you live?  No (Patient in agreement with ST rehab as she lives alone) Need for family participation in patient care:  Yes (Comment)  Care giving concerns:  Patient in agreement with ST rehab as she lives alone with her dog. Patient reported that she does have a friend who is checking on her dog.   Social Worker assessment / plan:  CSW talked with patient regarding discharge plan and recommendation of ST rehab. Patient was asleep when CSW entered room, but awakened easily and was willing to talk with CSW. Ms. Sabino was alert and oriented and talked with CSW lying in bed. Patient did advise CSW that she has a dog that a friend is looking after, but appeared to be concerned regarding being away from her dog for too long and this was discussed. Ms. Geddis gave CSW permission to contact her brother, who lives in [place], [place].  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  Patient/Family's Understanding of and Emotional Response to Diagnosis, Current Treatment, and Prognosis:  Not discussed.  Emotional Assessment Appearance:  Appears stated age Attitude/Demeanor/Rapport:  Other (Appropriate) Affect (typically observed):  Appropriate Orientation:  Oriented to Self, Oriented to Place, Oriented to  Time, Oriented to Situation Alcohol / Substance use:  Never Used Psych involvement (Current and /or in the community):  No (Comment)  Discharge Needs  Concerns to be addressed:  Discharge Planning Concerns Readmission within the last 30 days:  No Current discharge risk:  None Barriers to Discharge:  No Barriers Identified   Sable Feil, LCSW 08/05/2016, 11:37 AM

## 2016-08-05 NOTE — Clinical Social Work Placement (Addendum)
   CLINICAL SOCIAL WORK PLACEMENT  NOTE 08/06/16 - DISCHARGE PLAN CHANGED TO HOME. TRANSPORTATION ARRANGED BY PATIENT.   Date:  08/05/2016  Patient Details  Name: Andrea Kirk MRN: [id number] Date of Birth: 05-Mar-1940  Clinical Social Work is seeking post-discharge placement for this patient at the [place] level of care (*CSW will initial, date and re-position this form in  chart as items are completed):  No (Patient provided facility preference)   Patient/family provided with [facility] Work Department's list of facilities offering this level of care within the geographic area requested by the patient (or if unable, by the patient's family).  Yes   Patient/family informed of their freedom to choose among providers that offer the needed level of care, that participate in Medicare, Medicaid or managed care program needed by the patient, have an available bed and are willing to accept the patient.  No   Patient/family informed of [facility]'s ownership interest in [facility] and [facility], as well as of the fact that they are under no obligation to receive care at these facilities.  PASRR submitted to EDS on       PASRR number received on       Existing PASRR number confirmed on 08/05/16     FL2 transmitted to all facilities in geographic area requested by pt/family on 08/05/16     FL2 transmitted to all facilities within larger geographic area on       Patient informed that his/her managed care company has contracts with or will negotiate with certain facilities, including the following:        Yes   Patient/family informed of bed offers received.  Patient chooses bed at [facility]     Physician recommends and patient chooses bed at      Patient to be transferred to [facility] on 08/05/16.  Patient to be transferred to facility by Ambulance     Patient family notified on 08/05/16 of transfer.  Name of family member notified:  Dr.  Jori Moll Crites - [phone].     PHYSICIAN       Additional Comment:    _______________________________________________ Sable Feil, LCSW 08/05/2016, 11:43 AM

## 2016-08-05 NOTE — Discharge Summary (Signed)
Physician Discharge Summary  Andrea Kirk XFG:[id number] DOB: 02-08-1940 DOA: 08/04/2016  PCP: Aura Dials, MD  Admit date: 08/04/2016 Discharge date: 08/06/2016  Time spent: 45 minutes  Recommendations for Outpatient Follow-up:  Patient will be discharged to home with home health services. Continue physical and occupational therapy.  Patient will need to follow up with primary care provider within one week of discharge, repeat BMP. Follow up with pain specialist. Patient should continue medications as prescribed.  Patient should follow a heart healthy/carb modifed diet.   Discharge Diagnoses:  Acute kidney injury with hyperkalemia Acute encephalopathy Mildly elevated troponin/ history of coronary artery disease Pyuria, asymptomatic Essential hypertension Diabetes mellitus, type II Back pain, chronic Vertigo Vitamin B 12 deficiency GERD Depression Hyperlipidemia Frequent falls, chronic   Discharge Condition: Stable  Diet recommendation: heart healthy/carb modified  Filed Weights   08/04/16 0310 08/04/16 2009 08/05/16 2116  Weight: 79.4 kg (175 lb) 77.1 kg (170 lb) 70.3 kg (155 lb)    History of present illness:  On 08/04/2016 by Dr. Dallas Schimke Andrea Kirk is a 76 y.o. female  past medical history significant for back pain, diabetes, dizziness, hyperlipidemia and chronic falls presents emergency room with fall weakness. Patient states she was in her normal health for the last several days. States she was recently changed from Robaxin to Flexeril. Patient feels this made her weaker. Patient had a fall on the morning of admission but was able to get up under her own power but felt weak. Patient soon after standing fell again. Called EMS using her medical alert bracelet. Patient says it's been more difficult to urinate recently. Denies dysuria. No blood in urine. No fevers. No chest pain no cough. Hospital course: Labs showed acute kidney injury. Patient given a bolus of  fluid due to clinical presentation of dehydration. Hospitalist consulted for admission.  Hospital Course:  Acute kidney injury with hyperkalemia -Likely secondary to poor oral intake/dehdyration and medications -Creatinine upon admission 1.98, baseline creatinine approximately 0.80  -Creatinine currently currently 0.87 after IV fluids -potassium 4.6 -Repeat BMP in 1 week  Acute encephalopathy -Appears to be back at baseline -CT head: No acute intracranial abnormality -Suspect related to medications -Patient recently started Flexeril and has not been feeling well  Mildly elevated troponin/ history of coronary artery disease -Suspect secondary to demand ischemia and acute kidney injury -Currently chest pain-free -Troponin 0.83, trended downward, 0.52 -Pending echocardiogram -Continue aspirin, Plavix, statin, Coreg -Patient follows with Dr. Beau Fanny  Pyuria, asymptomatic -UA: 6-30WBC, no bacteria, negative nitrites/leukocytes -Was started on ceftriaxone, will discontinue -Patient denies pain with urination or burning -Currently afebrile, no leukocytosis  Essential hypertension -Continue Coreg -Lisinopril was held due to acute kidney injury, can restart on discharge  Diabetes mellitus, type II -Patient was on metformin and glipizide, which were held upon admission. May restart these medications on discharge  Back pain, chronic -Was recently on Robaxin however this was changed to Flexeril -Since starting Flexeril, patient states she's been having more falls and feeling worse -Continue low-dose Robaxin, discuss with pain management physician  -Follow-up with PCP on discharge -Continue gabapentin  Vertigo -Continue Antivert on discharge  Vitamin B 12 deficiency -Continue supplementation  GERD -Continue PPI  Depression -Continue Celexa  Hyperlipidemia -Continue statin  Frequent falls, chronic  -PT consulted and recommended SNF. Patient now refuses SNF wants to go  home. She understands the risks.  -Will discharge patient with home health services.  -patient recently had right CEA with Dr. Donnetta Hutching, however, per patient,  this was not related to her falls  Procedures: Echocardiogram  Consultations: None  Discharge Exam: Vitals:   08/06/16 1000 08/06/16 1100  BP: (!) 199/102 (!) 168/71  Pulse: 70 73  Resp:  16  Temp:     Patient states she is feeling better. Denies chest pain, shortness of breath, abdominal pain, N/V/D/C.    General: Well developed, well nourished, NAD, appears stated age  23: NCAT, mucous membranes moist.  Cardiovascular: S1 S2 auscultated, RRR, no murmur appreciated  Respiratory: Clear to auscultation bilaterally with equal chest rise  Abdomen: Soft, nontender, nondistended, + bowel sounds  Extremities: warm dry without cyanosis clubbing or edema  Neuro: AAOx3, nonfocal  Psych: Mildly anxious, but appropriate  Discharge Instructions Discharge Instructions    Discharge instructions    Complete by:  As directed    Patient will be discharged to home with home health services. Continue physical and occupational therapy.  Patient will need to follow up with primary care provider within one week of discharge, repeat BMP. Follow up with pain specialist. Patient should continue medications as prescribed.  Patient should follow a heart healthy/carb modifed diet.     Current Discharge Medication List    START taking these medications   Details  methocarbamol (ROBAXIN) 500 MG tablet Take 1 tablet (500 mg total) by mouth every 8 (eight) hours as needed for muscle spasms. Qty: 30 tablet, Refills: 0      CONTINUE these medications which have NOT CHANGED   Details  acetaminophen (TYLENOL) 500 MG tablet Take 1,000 mg by mouth every 6 (six) hours as needed for moderate pain. For pain    Ascorbic Acid (VITAMIN C) 1000 MG tablet Take 1,000 mg by mouth daily.    aspirin EC 81 MG tablet Take 81 mg by mouth every evening.       atorvastatin (LIPITOR) 40 MG tablet Take 40 mg by mouth daily at 6 PM.     Calcium Carbonate-Vitamin D (CALTRATE 600+D PO) Take 1 tablet by mouth daily.    carvedilol (COREG) 25 MG tablet TAKE 1 TABLET BY MOUTH 2  TIMES DAILY WITH A MEAL Qty: 180 tablet, Refills: 3    citalopram (CELEXA) 20 MG tablet Take 20 mg by mouth every morning.     clopidogrel (PLAVIX) 75 MG tablet Take 75 mg by mouth every morning. Will stop 5 days prior to procedure    Cyanocobalamin (B-12) 2500 MCG TABS Take 2,500 mcg by mouth daily.    fish oil-omega-3 fatty acids 1000 MG capsule Take 1 g by mouth daily.    gabapentin (NEURONTIN) 300 MG capsule Take 300 mg by mouth 4 (four) times daily.     glipiZIDE (GLUCOTROL XL) 10 MG 24 hr tablet Take 10 mg by mouth daily with breakfast.     lisinopril (PRINIVIL,ZESTRIL) 20 MG tablet TAKE 1 TABLET BY MOUTH  DAILY. KEEP OFFICE VISIT. Qty: 90 tablet, Refills: 3    Magnesium 250 MG TABS Take 250 mg by mouth daily.    metFORMIN (GLUCOPHAGE) 1000 MG tablet Take 1,000 mg by mouth 2 (two) times daily with a meal.     nitroGLYCERIN (NITROSTAT) 0.4 MG SL tablet Place 1 tablet (0.4 mg total) under the tongue every 5 (five) minutes as needed. Chest pain. Qty: 25 tablet, Refills: 5    pantoprazole (PROTONIX) 40 MG tablet Take 40 mg by mouth daily as needed (INDIGESTION).     meclizine (ANTIVERT) 25 MG tablet Take 25 mg by mouth 2 (two) times daily  as needed for dizziness. For dizziness       STOP taking these medications     cyclobenzaprine (FLEXERIL) 5 MG tablet      HYDROcodone-acetaminophen (NORCO) 10-325 MG tablet        Allergies  Allergen Reactions  . Codeine Other (See Comments)    Abnormal behavior  . Latex Other (See Comments)    tears skin  . Morphine And Related Other (See Comments)    Affects BP and blood sugar.    Contact information for follow-up providers    Aura Dials, MD. Schedule an appointment as soon as possible for a visit in 1  week(s).   Specialty:  Family Medicine Why:  Hospital follow up Contact information: [address] [place] [address] [phone]            Contact information for after-discharge care    Destination    [facility] .   Specialty:  [place] information: [address] [place] [phone]                   The results of significant diagnostics from this hospitalization (including imaging, microbiology, ancillary and laboratory) are listed below for reference.    Significant Diagnostic Studies: Dg Lumbar Spine Complete  Result Date: 08/04/2016 CLINICAL DATA:  Persistent low back and sacral pain after falling tonight. Recent medication change. EXAM: LUMBAR SPINE - COMPLETE 4+ VIEW COMPARISON:  None. FINDINGS: Posterior lumbar decompression with instrumented fusion at L4-5 with intervertebral fusion cage. Hardware is intact. Lumbar vertebrae are normal in height. No evidence of acute lumbar spine fracture. No spondylolisthesis. No bone lesion or bony destruction. IMPRESSION: Negative for acute lumbar spine fracture. Electronically Signed   By: Andreas Newport M.D.   On: 08/04/2016 05:06   Dg Pelvis 1-2 Views  Result Date: 08/04/2016 CLINICAL DATA:  Persistent pain after falling tonight. EXAM: PELVIS - 1-2 VIEW COMPARISON:  None. FINDINGS: There is no evidence of pelvic fracture or diastasis. No pelvic bone lesions are seen. IMPRESSION: Negative. Electronically Signed   By: Andreas Newport M.D.   On: 08/04/2016 05:06   Dg Sacrum/coccyx  Result Date: 08/04/2016 CLINICAL DATA:  Persistent pain after falling tonight EXAM: SACRUM AND COCCYX - 2+ VIEW COMPARISON:  None. FINDINGS: The sacrum, coccyx and sacroiliac joints appear intact. No evidence of acute fracture or diastases. IMPRESSION: Negative. Electronically Signed   By: Andreas Newport M.D.   On: 08/04/2016 05:07   Ct Head Wo  Contrast  Result Date: 08/04/2016 CLINICAL DATA:  76 y/o  F; weakness and fall. EXAM: CT HEAD WITHOUT CONTRAST CT CERVICAL SPINE WITHOUT CONTRAST TECHNIQUE: Multidetector CT imaging of the head and cervical spine was performed following the standard protocol without intravenous contrast. Multiplanar CT image reconstructions of the cervical spine were also generated. COMPARISON:  05/29/2016 MRI of the head. FINDINGS: CT HEAD FINDINGS Brain: No evidence of acute infarction, hemorrhage, hydrocephalus, extra-axial collection or mass lesion/mass effect. Mild chronic microvascular ischemic changes of white matter and mild brain parenchymal volume loss. Vascular: Moderate calcific atherosclerosis of carotid siphons. Skull: Right parietal scalp soft tissue thickening compatible with contusion. No displaced calvarial fracture. Sinuses/Orbits: Minimal bilateral maxillary sinus mucosal thickening. Otherwise negative. Other: Right intra-ocular lens replacement. CT CERVICAL SPINE FINDINGS Alignment: Straightening of cervical lordosis and grade 1 C7-T1 anterolisthesis. Skull base and vertebrae: No acute cervical spine fracture. No primary bone lesion or focal pathologic process. Soft tissues and spinal canal:  Postsurgical changes in the bilateral neck related to carotid endarterectomy. Disc levels: Advanced discogenic degenerative changes of the cervical spine with multilevel severe loss of disc space height. Mild facet arthrosis. Discogenic degenerative changes are greatest at the C6-7 level where a prominent disc osteophyte complex results in moderate canal stenosis. Upper chest: Negative. Other: Partially visualize comminuted fracture of the medial left clavicle. IMPRESSION: CT head: 1. Right parietal scalp soft tissue thickening probably representing a small contusion. No displaced calvarial fracture. 2. No acute intracranial abnormality. 3. Mild chronic microvascular ischemic changes and mild parenchymal volume loss of the  brain. CT cervical spine: 1. No acute fracture or dislocation of the cervical spine. 2. Partially visualized acute comminuted fracture of the medial left clavicle. 3. Advanced discogenic degenerative changes of the cervical spine greatest at the C6-7 level. Electronically Signed   By: Kristine Garbe M.D.   On: 08/04/2016 05:48   Ct Cervical Spine Wo Contrast  Result Date: 08/04/2016 CLINICAL DATA:  76 y/o  F; weakness and fall. EXAM: CT HEAD WITHOUT CONTRAST CT CERVICAL SPINE WITHOUT CONTRAST TECHNIQUE: Multidetector CT imaging of the head and cervical spine was performed following the standard protocol without intravenous contrast. Multiplanar CT image reconstructions of the cervical spine were also generated. COMPARISON:  05/29/2016 MRI of the head. FINDINGS: CT HEAD FINDINGS Brain: No evidence of acute infarction, hemorrhage, hydrocephalus, extra-axial collection or mass lesion/mass effect. Mild chronic microvascular ischemic changes of white matter and mild brain parenchymal volume loss. Vascular: Moderate calcific atherosclerosis of carotid siphons. Skull: Right parietal scalp soft tissue thickening compatible with contusion. No displaced calvarial fracture. Sinuses/Orbits: Minimal bilateral maxillary sinus mucosal thickening. Otherwise negative. Other: Right intra-ocular lens replacement. CT CERVICAL SPINE FINDINGS Alignment: Straightening of cervical lordosis and grade 1 C7-T1 anterolisthesis. Skull base and vertebrae: No acute cervical spine fracture. No primary bone lesion or focal pathologic process. Soft tissues and spinal canal: Postsurgical changes in the bilateral neck related to carotid endarterectomy. Disc levels: Advanced discogenic degenerative changes of the cervical spine with multilevel severe loss of disc space height. Mild facet arthrosis. Discogenic degenerative changes are greatest at the C6-7 level where a prominent disc osteophyte complex results in moderate canal stenosis.  Upper chest: Negative. Other: Partially visualize comminuted fracture of the medial left clavicle. IMPRESSION: CT head: 1. Right parietal scalp soft tissue thickening probably representing a small contusion. No displaced calvarial fracture. 2. No acute intracranial abnormality. 3. Mild chronic microvascular ischemic changes and mild parenchymal volume loss of the brain. CT cervical spine: 1. No acute fracture or dislocation of the cervical spine. 2. Partially visualized acute comminuted fracture of the medial left clavicle. 3. Advanced discogenic degenerative changes of the cervical spine greatest at the C6-7 level. Electronically Signed   By: Kristine Garbe M.D.   On: 08/04/2016 05:48    Microbiology: Recent Results (from the past 240 hour(s))  Urine Culture     Status: Abnormal   Collection Time: 08/04/16  6:30 AM  Result Value Ref Range Status   Specimen Description URINE, CLEAN CATCH  Final   Special Requests NONE  Final   Culture MULTIPLE SPECIES PRESENT, SUGGEST RECOLLECTION (A)  Final   Report Status 08/05/2016 FINAL  Final  MRSA PCR Screening     Status: Abnormal   Collection Time: 08/04/16  3:27 PM  Result Value Ref Range Status   MRSA by PCR POSITIVE (A) NEGATIVE Final    Comment:        The GeneXpert MRSA Assay (FDA approved  for NASAL specimens only), is one component of a comprehensive MRSA colonization surveillance program. It is not intended to diagnose MRSA infection nor to guide or monitor treatment for MRSA infections. RESULT CALLED TO, READ BACK BY AND VERIFIED WITH: R.GREENAWALT RN AT 1725 08/04/17 BY A.DAVIS      Labs: Basic Metabolic Panel:  Recent Labs Lab 08/04/16 0512 08/04/16 1401 08/05/16 0610 08/05/16 1459 08/06/16 0449  NA 135 141 140  --  140  K 5.3* 5.0 5.2* 5.3* 4.6  CL 98* 102 105  --  106  CO2 28 30 29   --  27  GLUCOSE 124* 206* 169*  --  164*  BUN 53* 46* 30*  --  15  CREATININE 1.98* 1.50* 0.87  --  0.64  CALCIUM 9.2 9.0 9.2   --  9.5  MG  --  2.4  --   --   --   PHOS  --  3.6  --   --   --    Liver Function Tests:  Recent Labs Lab 08/04/16 0512  AST 39  ALT 28  ALKPHOS 67  BILITOT 0.5  PROT 6.2*  ALBUMIN 3.6   No results for input(s): LIPASE, AMYLASE in the last 168 hours. No results for input(s): AMMONIA in the last 168 hours. CBC:  Recent Labs Lab 08/04/16 0512 08/05/16 0610  WBC 9.5 6.0  NEUTROABS 5.4  --   HGB 10.4* 10.4*  HCT 32.5* 33.1*  MCV 101.2* 103.8*  PLT 190 190   Cardiac Enzymes:  Recent Labs Lab 08/04/16 1401 08/04/16 1809 08/05/16 0002  TROPONINI 0.83* 0.62* 0.52*   BNP: BNP (last 3 results) No results for input(s): BNP in the last 8760 hours.  ProBNP (last 3 results) No results for input(s): PROBNP in the last 8760 hours.  CBG:  Recent Labs Lab 08/05/16 0748 08/05/16 1151 08/05/16 1708 08/05/16 2114 08/06/16 0805  GLUCAP 158* 158* 149* 182* 151*       Signed:  Tylena Prisk  [facility] 08/06/2016, 11:38 AM

## 2016-08-05 NOTE — Progress Notes (Signed)
PROGRESS NOTE    Andrea Kirk  BPZ:[id number] DOB: 1940-05-14 DOA: 08/04/2016 PCP: Aura Dials, MD   Chief Complaint  Patient presents with  . Fall  . Weakness    Brief Narrative:  Admitted for AKI, AMS.  Assessment & Plan   Acute kidney injury with hyperkalemia -Likely secondary to poor oral intake/dehdyration and medications -Creatinine upon admission 1.98, baseline creatinine approximately 0.80  -Creatinine currently currently 0.87 after IV fluids -potassium 5.3, will give kayexalate  -Continue to monitor BMP  Acute encephalopathy -CT head: No acute intracranial abnormality -Suspect related to medications -Patient recently started Flexeril and has not been feeling well  Mildly elevated troponin/ history of coronary artery disease -Suspect secondary to demand ischemia and acute kidney injury -Currently chest pain-free -Troponin 0.83, trended downward, 0.52 -Echocardiogram EF60-65%, G2DD, PA peak pressure 45mmHg  -Continue aspirin, Plavix, statin, Coreg -Patient follows with Dr. Beau Fanny  Pyuria, asymptomatic -UA: 6-30WBC, no bacteria, negative nitrites/leukocytes -Was started on ceftriaxone, will discontinue -Patient denies pain with urination or burning -Currently afebrile, no leukocytosis  Essential hypertension -Continue Coreg -Lisinopril was held due to acute kidney injury, can restart on discharge  Diabetes mellitus, type II -Patient was on metformin and glipizide, which were held upon admission. May restart these medications on discharge  Back pain, chronic -Was recently on Robaxin however this was changed to Flexeril -Since starting Flexeril, patient states she's been having more falls and feeling worse -Will restart low-dose Robaxin -Follow-up with PCP on discharge -Continue gabapentin  Vertigo -Continue Antivert on discharge  Vitamin B 12 deficiency -Continue supplementation  GERD -Continue PPI  Depression -Continue  Celexa  Hyperlipidemia -Continue statin  Frequent falls, chronic  -PT consulted and recommended SNF -patient recently had right CEA with Dr. Donnetta Hutching, however, per patient, this was not related to her falls  DVT Prophylaxis  SCDs  Code Status: Full  Family Communication: None at bedside, brother via phone  Disposition Plan: observation. Pending improvement in potassium. D/c to SNF possibly in 24 hours.  Consultants None  Procedures  Echocardiogram  Antibiotics   Anti-infectives    Start     Dose/Rate Route Frequency Ordered Stop   08/04/16 0800  cefTRIAXone (ROCEPHIN) 1 g in dextrose 5 % 50 mL IVPB  Status:  Discontinued     1 g 100 mL/hr over 30 Minutes Intravenous Every 24 hours 08/04/16 0754 08/05/16 0819      Subjective:   Andrea Kirk seen and examined today.  Patient feeling better today. Denies chest pain, shortness of breath, abdominal pain, nausea, vomiting, constipation. Denies current dizziness or headache. States she has been falling more and feeling weak after starting flexeril.  Objective:   Vitals:   08/05/16 0531 08/05/16 0800 08/05/16 0844 08/05/16 1300  BP: (!) 134/112 (!) 194/94 (!) 175/120 (!) 160/84  Pulse:   77 71  Resp:  16 19 16   Temp:  98 F (36.7 C) 98.4 F (36.9 C)   TempSrc:  Oral Oral   SpO2:  94% 94% 96%  Weight:      Height:        Intake/Output Summary (Last 24 hours) at 08/05/16 1616 Last data filed at 08/05/16 1442  Gross per 24 hour  Intake          1071.33 ml  Output                0 ml  Net          1071.33 ml   [employer]  08/04/16 0310 08/04/16 2009  Weight: 79.4 kg (175 lb) 77.1 kg (170 lb)    Exam  General: Well developed, well nourished, NAD, appears stated age  HEENT: NCAT, mucous membranes moist.   Cardiovascular: S1 S2 auscultated, no rubs, murmurs or gallops. Regular rate and rhythm.  Respiratory: Clear to auscultation bilaterally with equal chest rise  Abdomen: Soft, nontender, nondistended, +  bowel sounds  Extremities: warm dry without cyanosis clubbing or edema  Neuro: AAOx3, nonfocal  Psych: Flat but appropriate   Data Reviewed: I have personally reviewed following labs and imaging studies  CBC:  Recent Labs Lab 08/04/16 0512 08/05/16 0610  WBC 9.5 6.0  NEUTROABS 5.4  --   HGB 10.4* 10.4*  HCT 32.5* 33.1*  MCV 101.2* 103.8*  PLT 190 295   Basic Metabolic Panel:  Recent Labs Lab 08/04/16 0512 08/04/16 1401 08/05/16 0610 08/05/16 1459  NA 135 141 140  --   K 5.3* 5.0 5.2* 5.3*  CL 98* 102 105  --   CO2 28 30 29   --   GLUCOSE 124* 206* 169*  --   BUN 53* 46* 30*  --   CREATININE 1.98* 1.50* 0.87  --   CALCIUM 9.2 9.0 9.2  --   MG  --  2.4  --   --   PHOS  --  3.6  --   --    GFR: Estimated Creatinine Clearance: 57.3 mL/min (by C-G formula based on SCr of 0.87 mg/dL). Liver Function Tests:  Recent Labs Lab 08/04/16 0512  AST 39  ALT 28  ALKPHOS 67  BILITOT 0.5  PROT 6.2*  ALBUMIN 3.6   No results for input(s): LIPASE, AMYLASE in the last 168 hours. No results for input(s): AMMONIA in the last 168 hours. Coagulation Profile: No results for input(s): INR, PROTIME in the last 168 hours. Cardiac Enzymes:  Recent Labs Lab 08/04/16 1401 08/04/16 1809 08/05/16 0002  TROPONINI 0.83* 0.62* 0.52*   BNP (last 3 results) No results for input(s): PROBNP in the last 8760 hours. HbA1C: No results for input(s): HGBA1C in the last 72 hours. CBG:  Recent Labs Lab 08/04/16 1204 08/04/16 1707 08/04/16 2054 08/05/16 0748 08/05/16 1151  GLUCAP 112* 230* 202* 158* 158*   Lipid Profile: No results for input(s): CHOL, HDL, LDLCALC, TRIG, CHOLHDL, LDLDIRECT in the last 72 hours. Thyroid Function Tests: No results for input(s): TSH, T4TOTAL, FREET4, T3FREE, THYROIDAB in the last 72 hours. Anemia Panel: No results for input(s): VITAMINB12, FOLATE, FERRITIN, TIBC, IRON, RETICCTPCT in the last 72 hours. Urine analysis:    Component Value  Date/Time   COLORURINE YELLOW 08/04/2016 0630   APPEARANCEUR CLEAR 08/04/2016 0630   LABSPEC 1.019 08/04/2016 0630   PHURINE 5.0 08/04/2016 0630   GLUCOSEU NEGATIVE 08/04/2016 0630   HGBUR NEGATIVE 08/04/2016 0630   BILIRUBINUR NEGATIVE 08/04/2016 0630   KETONESUR NEGATIVE 08/04/2016 0630   PROTEINUR 30 (A) 08/04/2016 0630   UROBILINOGEN 1.0 06/17/2014 2145   NITRITE NEGATIVE 08/04/2016 0630   LEUKOCYTESUR NEGATIVE 08/04/2016 0630   Sepsis Labs: @LABRCNTIP (procalcitonin:4,lacticidven:4)  ) Recent Results (from the past 240 hour(s))  Urine Culture     Status: Abnormal   Collection Time: 08/04/16  6:30 AM  Result Value Ref Range Status   Specimen Description URINE, CLEAN CATCH  Final   Special Requests NONE  Final   Culture MULTIPLE SPECIES PRESENT, SUGGEST RECOLLECTION (A)  Final   Report Status 08/05/2016 FINAL  Final  MRSA PCR Screening     Status:  Abnormal   Collection Time: 08/04/16  3:27 PM  Result Value Ref Range Status   MRSA by PCR POSITIVE (A) NEGATIVE Final    Comment:        The GeneXpert MRSA Assay (FDA approved for NASAL specimens only), is one component of a comprehensive MRSA colonization surveillance program. It is not intended to diagnose MRSA infection nor to guide or monitor treatment for MRSA infections. RESULT CALLED TO, READ BACK BY AND VERIFIED WITH: R.GREENAWALT RN AT 8469 08/04/17 BY A.DAVIS       Radiology Studies: Dg Lumbar Spine Complete  Result Date: 08/04/2016 CLINICAL DATA:  Persistent low back and sacral pain after falling tonight. Recent medication change. EXAM: LUMBAR SPINE - COMPLETE 4+ VIEW COMPARISON:  None. FINDINGS: Posterior lumbar decompression with instrumented fusion at L4-5 with intervertebral fusion cage. Hardware is intact. Lumbar vertebrae are normal in height. No evidence of acute lumbar spine fracture. No spondylolisthesis. No bone lesion or bony destruction. IMPRESSION: Negative for acute lumbar spine fracture.  Electronically Signed   By: Andreas Newport M.D.   On: 08/04/2016 05:06   Dg Pelvis 1-2 Views  Result Date: 08/04/2016 CLINICAL DATA:  Persistent pain after falling tonight. EXAM: PELVIS - 1-2 VIEW COMPARISON:  None. FINDINGS: There is no evidence of pelvic fracture or diastasis. No pelvic bone lesions are seen. IMPRESSION: Negative. Electronically Signed   By: Andreas Newport M.D.   On: 08/04/2016 05:06   Dg Sacrum/coccyx  Result Date: 08/04/2016 CLINICAL DATA:  Persistent pain after falling tonight EXAM: SACRUM AND COCCYX - 2+ VIEW COMPARISON:  None. FINDINGS: The sacrum, coccyx and sacroiliac joints appear intact. No evidence of acute fracture or diastases. IMPRESSION: Negative. Electronically Signed   By: Andreas Newport M.D.   On: 08/04/2016 05:07   Ct Head Wo Contrast  Result Date: 08/04/2016 CLINICAL DATA:  76 y/o  F; weakness and fall. EXAM: CT HEAD WITHOUT CONTRAST CT CERVICAL SPINE WITHOUT CONTRAST TECHNIQUE: Multidetector CT imaging of the head and cervical spine was performed following the standard protocol without intravenous contrast. Multiplanar CT image reconstructions of the cervical spine were also generated. COMPARISON:  05/29/2016 MRI of the head. FINDINGS: CT HEAD FINDINGS Brain: No evidence of acute infarction, hemorrhage, hydrocephalus, extra-axial collection or mass lesion/mass effect. Mild chronic microvascular ischemic changes of white matter and mild brain parenchymal volume loss. Vascular: Moderate calcific atherosclerosis of carotid siphons. Skull: Right parietal scalp soft tissue thickening compatible with contusion. No displaced calvarial fracture. Sinuses/Orbits: Minimal bilateral maxillary sinus mucosal thickening. Otherwise negative. Other: Right intra-ocular lens replacement. CT CERVICAL SPINE FINDINGS Alignment: Straightening of cervical lordosis and grade 1 C7-T1 anterolisthesis. Skull base and vertebrae: No acute cervical spine fracture. No primary bone lesion  or focal pathologic process. Soft tissues and spinal canal: Postsurgical changes in the bilateral neck related to carotid endarterectomy. Disc levels: Advanced discogenic degenerative changes of the cervical spine with multilevel severe loss of disc space height. Mild facet arthrosis. Discogenic degenerative changes are greatest at the C6-7 level where a prominent disc osteophyte complex results in moderate canal stenosis. Upper chest: Negative. Other: Partially visualize comminuted fracture of the medial left clavicle. IMPRESSION: CT head: 1. Right parietal scalp soft tissue thickening probably representing a small contusion. No displaced calvarial fracture. 2. No acute intracranial abnormality. 3. Mild chronic microvascular ischemic changes and mild parenchymal volume loss of the brain. CT cervical spine: 1. No acute fracture or dislocation of the cervical spine. 2. Partially visualized acute comminuted fracture of the medial left  clavicle. 3. Advanced discogenic degenerative changes of the cervical spine greatest at the C6-7 level. Electronically Signed   By: Kristine Garbe M.D.   On: 08/04/2016 05:48   Ct Cervical Spine Wo Contrast  Result Date: 08/04/2016 CLINICAL DATA:  76 y/o  F; weakness and fall. EXAM: CT HEAD WITHOUT CONTRAST CT CERVICAL SPINE WITHOUT CONTRAST TECHNIQUE: Multidetector CT imaging of the head and cervical spine was performed following the standard protocol without intravenous contrast. Multiplanar CT image reconstructions of the cervical spine were also generated. COMPARISON:  05/29/2016 MRI of the head. FINDINGS: CT HEAD FINDINGS Brain: No evidence of acute infarction, hemorrhage, hydrocephalus, extra-axial collection or mass lesion/mass effect. Mild chronic microvascular ischemic changes of white matter and mild brain parenchymal volume loss. Vascular: Moderate calcific atherosclerosis of carotid siphons. Skull: Right parietal scalp soft tissue thickening compatible with  contusion. No displaced calvarial fracture. Sinuses/Orbits: Minimal bilateral maxillary sinus mucosal thickening. Otherwise negative. Other: Right intra-ocular lens replacement. CT CERVICAL SPINE FINDINGS Alignment: Straightening of cervical lordosis and grade 1 C7-T1 anterolisthesis. Skull base and vertebrae: No acute cervical spine fracture. No primary bone lesion or focal pathologic process. Soft tissues and spinal canal: Postsurgical changes in the bilateral neck related to carotid endarterectomy. Disc levels: Advanced discogenic degenerative changes of the cervical spine with multilevel severe loss of disc space height. Mild facet arthrosis. Discogenic degenerative changes are greatest at the C6-7 level where a prominent disc osteophyte complex results in moderate canal stenosis. Upper chest: Negative. Other: Partially visualize comminuted fracture of the medial left clavicle. IMPRESSION: CT head: 1. Right parietal scalp soft tissue thickening probably representing a small contusion. No displaced calvarial fracture. 2. No acute intracranial abnormality. 3. Mild chronic microvascular ischemic changes and mild parenchymal volume loss of the brain. CT cervical spine: 1. No acute fracture or dislocation of the cervical spine. 2. Partially visualized acute comminuted fracture of the medial left clavicle. 3. Advanced discogenic degenerative changes of the cervical spine greatest at the C6-7 level. Electronically Signed   By: Kristine Garbe M.D.   On: 08/04/2016 05:48     Scheduled Meds: . aspirin  324 mg Oral Daily  . atorvastatin  40 mg Oral q1800  . carvedilol  25 mg Oral BID WC  . Chlorhexidine Gluconate Cloth  6 each Topical Q0600  . citalopram  20 mg Oral q morning - 10a  . clopidogrel  75 mg Oral q morning - 10a  . insulin aspart  0-15 Units Subcutaneous TID WC  . mupirocin ointment  1 application Nasal BID  . sodium chloride flush  3 mL Intravenous Q12H  . sodium polystyrene  15 g Oral  Once  . vitamin B-12  2,500 mcg Oral Daily   Continuous Infusions: . sodium chloride 100 mL/hr at 08/04/16 2134     LOS: 1 day   Time Spent in minutes   30 minutes  Narayan Scull D.O. on 08/05/2016 at 4:16 PM  Between 7am to 7pm - Pager - [phone]  After 7pm go to [email or web address] - password [email or web address]  And look for the night coverage person covering for me after hours  [facility] Office  [phone]

## 2016-08-05 NOTE — Progress Notes (Signed)
  Echocardiogram 2D Echocardiogram has been performed.  Andrea Kirk 08/05/2016, 3:21 PM

## 2016-08-05 NOTE — NC FL2 (Signed)
[place] MEDICAID FL2 LEVEL OF CARE SCREENING TOOL     IDENTIFICATION  Patient Name: Andrea Kirk Birthdate: 16-Apr-1940 Sex: female Admission Date (Current Location): 08/04/2016  [facility] and [place] Number:  Herbalist and Address:  [facility] [place] [place] [address], [place], [place] [address]      Provider Number: [id number]  Attending Physician Name and Address:  Cristal Ford, DO  Relative Name and Phone Number:  Fre[place]ark,Ronald Dr. Johnathan Hausen; [phone] (mobile)    Current Level of Care: Hospital Recommended Level of Care: [place] Prior Approval Number:    Date Approved/Denied:   PASRR Number: [id number]  Discharge Plan: SNF    Current Diagnoses: Patient Active Problem List   Diagnosis Date Noted  . AKI (acute kidney injury) ([place]) 08/04/2016  . Asymptomatic stenosis of right carotid artery 03/22/2016  . Diabetes mellitus ([place]) 05/02/2015  . Carotid stenosis 11/16/2013  . Aftercare following surgery of the circulatory system, [place] 05/11/2013  . Carotid artery disease ([place]) 04/16/2013  . Coronary atherosclerosis of native coronary artery 03/11/2013  . Essential hypertension, benign 03/11/2013  . Mixed hyperlipidemia 03/11/2013  . Occlusion and stenosis of carotid artery without mention of cerebral infarction 07/19/2011    Orientation RESPIRATION BLADDER Height & Weight     Self, Time, Situation, Place  Normal Continent Weight: 170 lb (77.1 kg) Height:  5\' 5"  (165.1 cm)  BEHAVIORAL SYMPTOMS/MOOD NEUROLOGICAL BOWEL NUTRITION STATUS      Continent Diet (Carb modified)  AMBULATORY STATUS COMMUNICATION OF NEEDS Skin   Limited Assist (Min guardAmbulated 24 ft. with 1 resting break) Verbally Other (Comment) (Scratch marks - biateral legs)                       Personal Care Assistance Level of Assistance  Bathing, Feeding, Dressing Bathing Assistance: Limited assistance (Patient min assist with transfers and bed  mobiity) Feeding assistance: Independent Dressing Assistance: Limited assistance (Patient min assist with transfers and bed mobility)     Functional Limitations Info  Sight, Hearing, Speech Sight Info: Adequate Hearing Info: Adequate Speech Info: Adequate    SPECIAL CARE FACTORS FREQUENCY  PT (By licensed PT)     PT Frequency: Evaluated 7/29 and a minimum of 2X per week therapy recommended              Contractures Contractures Info: Not present    Additional Factors Info  Code Status, Allergies, Insulin Sliding Scale Code Status Info: Full Allergies Info: Codeine, Latex, Morphine and related   Insulin Sliding Scale Info: 0-15 Units 3X daily with meals       Current Medications (08/05/2016):  This is the current hospital active medication list Current Facility-Administered Medications  Medication Dose Route Frequency Provider Last Rate Last Dose  . 0.9 %  sodium chloride infusion   Intravenous Continuous Elwin Mocha, MD 100 mL/hr at 08/04/16 2134    . acetaminophen (TYLENOL) tablet 650 mg  650 mg Oral Q6H PRN Elwin Mocha, MD   650 mg at 08/05/16 2458   Or  . acetaminophen (TYLENOL) suppository 650 mg  650 mg Rectal Q6H PRN Elwin Mocha, MD      . aspirin chewable tablet 324 mg  324 mg Oral Daily Elwin Mocha, MD   324 mg at 08/05/16 1003  . atorvastatin (LIPITOR) tablet 40 mg  40 mg Oral q1800 Elwin Mocha, MD      . carvedilol (COREG) tablet 25  mg  25 mg Oral BID WC Elwin Mocha, MD   25 mg at 08/05/16 0846  . Chlorhexidine Gluconate Cloth 2 % PADS 6 each  6 each Topical Q0600 Elwin Mocha, MD   6 each at 08/05/16 [phone]  . citalopram (CELEXA) tablet 20 mg  20 mg Oral q morning - 10a Elwin Mocha, MD   20 mg at 08/05/16 1002  . clopidogrel (PLAVIX) tablet 75 mg  75 mg Oral q morning - 10a Elwin Mocha, MD   75 mg at 08/05/16 1002  . hydrALAZINE (APRESOLINE) injection 10 mg  10 mg Intravenous Q8H PRN Elwin Mocha, MD      . insulin  aspart (novoLOG) injection 0-15 Units  0-15 Units Subcutaneous TID WC Elwin Mocha, MD   3 Units at 08/05/16 [phone]  . methocarbamol (ROBAXIN) tablet 500 mg  500 mg Oral Q8H PRN Cristal Ford, DO   500 mg at 08/05/16 1002  . mupirocin ointment (BACTROBAN) 2 % 1 application  1 application Nasal BID Elwin Mocha, MD   1 application at 81/84/03 1003  . nitroGLYCERIN (NITROSTAT) SL tablet 0.4 mg  0.4 mg Sublingual Q5 min PRN Elwin Mocha, MD      . ondansetron [facility]) tablet 4 mg  4 mg Oral Q6H PRN Elwin Mocha, MD       Or  . ondansetron [facility]) injection 4 mg  4 mg Intravenous Q6H PRN Elwin Mocha, MD      . pantoprazole (PROTONIX) EC tablet 40 mg  40 mg Oral Daily PRN Elwin Mocha, MD      . sodium chloride flush (NS) 0.9 % injection 3 mL  3 mL Intravenous Q12H Elwin Mocha, MD   3 mL at 08/05/16 1002  . vitamin B-12 (CYANOCOBALAMIN) tablet 2,500 mcg  2,500 mcg Oral Daily Elwin Mocha, MD   2,500 mcg at 08/05/16 1002     Discharge Medications: Please see discharge summary for a list of discharge medications.  Relevant Imaging Results:  Relevant Lab Results:   Additional Information [phone]  Sable Feil, LCSW

## 2016-08-05 NOTE — Care Management CC44 (Signed)
Condition Code 44 Documentation Completed  Patient Details  Name: Tevis Conger Lough MRN: [id number] Date of Birth: 04/16/1940   Condition Code 44 given:  Yes Patient signature on Condition Code 44 notice:  Yes Documentation of 2 MD's agreement:  Yes Code 44 added to claim:  Yes    Ella Bodo, RN 08/05/2016, 4:27 PM

## 2016-08-05 NOTE — Care Management Obs Status (Signed)
[place] NOTIFICATION   Patient Details  Name: Andrea Kirk MRN: [id number] Date of Birth: 1940-09-21   Medicare Observation Status Notification Given:  Yes    Ella Bodo, RN 08/05/2016, 4:26 PM

## 2016-08-06 DIAGNOSIS — E875 Hyperkalemia: Secondary | ICD-10-CM

## 2016-08-06 DIAGNOSIS — I10 Essential (primary) hypertension: Secondary | ICD-10-CM | POA: Diagnosis not present

## 2016-08-06 DIAGNOSIS — E1159 Type 2 diabetes mellitus with other circulatory complications: Secondary | ICD-10-CM | POA: Diagnosis not present

## 2016-08-06 DIAGNOSIS — E860 Dehydration: Secondary | ICD-10-CM | POA: Diagnosis not present

## 2016-08-06 DIAGNOSIS — N179 Acute kidney failure, unspecified: Secondary | ICD-10-CM | POA: Diagnosis not present

## 2016-08-06 LAB — BASIC METABOLIC PANEL
Anion gap: 7 (ref 5–15)
BUN: 15 mg/dL (ref 6–20)
CO2: 27 mmol/L (ref 22–32)
Calcium: 9.5 mg/dL (ref 8.9–10.3)
Chloride: 106 mmol/L (ref 101–111)
Creatinine, Ser: 0.64 mg/dL (ref 0.44–1.00)
GFR calc Af Amer: >60 mL/min (ref >60)
GFR calc non Af Amer: >60 mL/min (ref >60)
Glucose, Bld: 164 mg/dL — ABNORMAL HIGH (ref 65–99)
Potassium: 4.6 mmol/L (ref 3.5–5.1)
Sodium: 140 mmol/L (ref 135–145)

## 2016-08-06 LAB — GLUCOSE, CAPILLARY
Glucose-Capillary: 151 mg/dL — ABNORMAL HIGH (ref 65–99)
Glucose-Capillary: 169 mg/dL — ABNORMAL HIGH (ref 65–99)

## 2016-08-06 MED ORDER — METHOCARBAMOL 500 MG PO TABS: 500.0000 mg | ORAL_TABLET | Freq: Three times a day (TID) | ORAL | 0 refills | Status: DC | PRN

## 2016-08-06 NOTE — Evaluation (Signed)
Occupational Therapy Evaluation Patient Details Name: Andrea Kirk MRN: [id number] DOB: [birth year] Today's Date: 08/06/2016    History of Present Illness Natahlia L Stevison is a 76 y.o. female  past medical history significant for back pain, diabetes, dizziness, hyperlipidemia and chronic falls, is admitted 08/04/16 with falls and weakness   Clinical Impression   This 76 y/o F presents with the above. At baseline Pt is mod independent with ADLs and functional mobility. Pt currently requires minGuard assist for functional mobility using RW and ModA for LB ADLs. Pt requesting wishes to return home however reports she does not have 24 hr assist at home. Pt will benefit from continued acute OT services and feel Pt will benefit from continued OT services in SNF setting prior to return home to maximize Pt's safety and independence with ADLs and functional mobility.     Follow Up Recommendations  SNF;Other (comment) (if Pt declines SNF, recommend HHOT)    Equipment Recommendations  3 in 1 bedside commode           Precautions / Restrictions Precautions Precautions: Fall Precaution Comments: multiple falls at home; monitor BP Restrictions Weight Bearing Restrictions: No      Mobility Bed Mobility Overal bed mobility: Needs Assistance Bed Mobility: Supine to Sit;Sit to Supine     Supine to sit: Min guard Sit to supine: Min guard   General bed mobility comments: minGuard for safety   Transfers Overall transfer level: Needs assistance Equipment used: Rolling walker (2 wheeled) Transfers: Sit to/from Stand Sit to Stand: Min guard         General transfer comment: verbal cues for hand placement     Balance Overall balance assessment: Needs assistance;History of Falls Sitting-balance support: No upper extremity supported;Feet supported Sitting balance-Leahy Scale: Good     Standing balance support: Bilateral upper extremity supported;During functional activity Standing  balance-Leahy Scale: Fair Standing balance comment: Pt washes hands standing at sink with minGuard for safety                            ADL either performed or assessed with clinical judgement   ADL Overall ADL's : Needs assistance/impaired Eating/Feeding: Set up;Sitting   Grooming: Wash/dry hands;Standing;Min guard   Upper Body Bathing: Min guard;Sitting   Lower Body Bathing: Minimal assistance;Sit to/from stand   Upper Body Dressing : Min guard;Sitting   Lower Body Dressing: Sit to/from stand;Moderate assistance   Toilet Transfer: Min guard;Ambulation;Comfort height toilet;Grab bars;RW   Toileting- Water quality scientist and Hygiene: Min guard;Sit to/from stand       Functional mobility during ADLs: Min guard;Rolling walker General ADL Comments: Pt requesting need to use bathroom, completes room level mobility to complete toilet transfer, toileting, standing at sink to wash hands, Pt initiates walking toward doorway and into hallway, completes approx 10' hallway functional mobility with MinGuard assist before returning to bedside; requires verbal cues for safe use of RW during toileting, grooming ADLs                          Pertinent Vitals/Pain Pain Assessment: Faces Faces Pain Scale: No hurt          Extremity/Trunk Assessment Upper Extremity Assessment Upper Extremity Assessment: Generalized weakness   Lower Extremity Assessment Lower Extremity Assessment: Defer to PT evaluation       Communication Communication Communication: No difficulties   Cognition Arousal/Alertness: Awake/alert Behavior During Therapy: [facility] for tasks assessed/performed  Overall Cognitive Status: Within Functional Limits for tasks assessed                                                      Home Living Family/patient expects to be discharged to:: Private residence Living Arrangements: Alone Available Help at Discharge:  Family;Friend(s);Available PRN/intermittently Type of Home: Apartment Home Access: Level entry (on first floor )     Home Layout: One level     Bathroom Shower/Tub: Teacher, early years/pre: Standard     Home Equipment: Environmental consultant - 2 wheels;Cane - single point;Grab bars - tub/shower          Prior Functioning/Environment Level of Independence: Independent with assistive device(s)        Comments: Patient uses cane for ambulation.  Patient drives. Works a part-time job.        OT Problem List: Decreased strength;Decreased activity tolerance      OT Treatment/Interventions: Self-care/ADL training;DME and/or AE instruction;Therapeutic activities;Balance training;Therapeutic exercise;Energy conservation;Patient/family education    OT Goals(Current goals can be found in the care plan section) Acute Rehab OT Goals Patient Stated Goal: to go home OT Goal Formulation: With patient Time For Goal Achievement: 08/20/16 Potential to Achieve Goals: Good  OT Frequency: Min 2X/week   Barriers to D/C: Decreased caregiver support                        AM-PAC PT "6 Clicks" Daily Activity     Outcome Measure Help from another person eating meals?: None Help from another person taking care of personal grooming?: A Little Help from another person toileting, which includes using toliet, bedpan, or urinal?: A Little Help from another person bathing (including washing, rinsing, drying)?: A Lot Help from another person to put on and taking off regular upper body clothing?: A Little Help from another person to put on and taking off regular lower body clothing?: A Lot 6 Click Score: 17   End of Session Equipment Utilized During Treatment: Gait belt;Rolling walker Nurse Communication: Mobility status;Other (comment) (okay to work with Pt this AM)  Activity Tolerance: Patient tolerated treatment well Patient left: in bed;with call bell/phone within reach;with bed alarm  set  OT Visit Diagnosis: Muscle weakness (generalized) (M62.81)                Time: 8242-3536 OT Time Calculation (min): 21 min Charges:  OT General Charges $OT Visit: 1 Procedure OT Evaluation $OT Eval Low Complexity: 1 Procedure G-Codes: OT G-codes **NOT FOR INPATIENT CLASS** Functional Assessment Tool Used: AM-PAC 6 Clicks Daily Activity;Clinical judgement Functional Limitation: Self care Self Care Current Status (R4431): At least 20 percent but less than 40 percent impaired, limited or restricted Self Care Goal Status (V4008): At least 1 percent but less than 20 percent impaired, limited or restricted   Lou Cal, OT Pager [phone] 08/06/2016   Raymondo Band 08/06/2016, 9:27 AM

## 2016-08-06 NOTE — Discharge Summary (Signed)
Pt given discharge instructions, prescription, and follow up info. Denies questions. Friend to pick patient up.

## 2016-08-06 NOTE — Clinical Social Work Note (Signed)
CSW advised by MD this morning that patient plans to d/c home, mainly because she has a dog at home. CSW visited with patient and was informed the same and contact made with Claiborne Billings at [facility] to inform her of change in discharge plans. CSW signing off as patient will d/c home today and has arranged transport home. Please re-consult if any other SW needs arise prior to discharge.  Tyona Nilsen Givens, MSW, LCSW Licensed Clinical Social Worker [place] [phone]

## 2016-08-06 NOTE — Care Management Note (Addendum)
[facility] Management Note  Patient Details  Name: Andrea Kirk MRN: [id number] Date of Birth: 1940/02/05  Subjective/Objective:    Pt admitted with fall and weakness                Action/Plan:   PTA from home.  SNF recommended but pt refusing - states she wants to go home with family/friend support.  Pt is interested in [facility] - choice given and pt chose [facility].  CM contacted agency and referral accepted - agency informed that pt will discharge home today.  Pts friend will transport her home.  Pt given choice for DME - pt chose [facility] - agency contacted and referral accepted   Expected Discharge Date:  08/06/16               Expected Discharge Plan:  [place]  In-House Referral:     Discharge planning Services  CM Consult  Post Acute Care Choice:    Choice offered to:  Patient  DME Arranged:  3-N-1 DME Agency:  [place]:  RN, PT, OT, Nurse's Aide, Social Work, Refused SNF [employer] Agency:   [name])  Status of Service:  Completed, signed off  If discussed at [employer], dates discussed:    Additional Comments: [name] is unable to take pt due to staffing - pts second choice is [facility] - agency contacted and accepted referral [name], RN 08/06/2016, 12:36 PM

## 2016-08-06 NOTE — Discharge Instructions (Signed)
Acute Kidney Injury, Adult Acute kidney injury is a sudden worsening of kidney function. The kidneys are organs that have several jobs. They filter the blood to remove waste products and extra fluid. They also maintain a healthy balance of minerals and hormones in the body, which helps control blood pressure and keep bones strong. With this condition, your kidneys do not do their jobs as well as they should. This condition ranges from mild to severe. Over time it may develop into long-lasting (chronic) kidney disease. Early detection and treatment may prevent acute kidney injury from developing into a chronic condition. What are the causes? Common causes of this condition include:  A problem with blood flow to the kidneys. This may be caused by: ? Low blood pressure (hypotension) or shock. ? Blood loss. ? Heart and blood vessel (cardiovascular) disease. ? Severe burns. ? Liver disease.  Direct damage to the kidneys. This may be caused by: ? Certain medicines. ? A kidney infection. ? Poisoning. ? Being around or in contact with toxic substances. ? A surgical wound. ? A hard, direct hit to the kidney area.  A sudden blockage of urine flow. This may be caused by: ? Cancer. ? Kidney stones. ? An enlarged prostate in males.  What are the signs or symptoms? Symptoms of this condition may not be obvious until the condition becomes severe. Symptoms of this condition can include:  Tiredness (lethargy), or difficulty staying awake.  Nausea or vomiting.  Swelling (edema) of the face, legs, ankles, or feet.  Problems with urination, such as: ? Abdominal pain, or pain along the side of your stomach (flank). ? Decreased urine production. ? Decrease in the force of urine flow.  Muscle twitches and cramps, especially in the legs.  Confusion or trouble concentrating.  Loss of appetite.  Fever.  How is this diagnosed? This condition may be diagnosed with tests, including:  Blood  tests.  Urine tests.  Imaging tests.  A test in which a sample of tissue is removed from the kidneys to be examined under a microscope (kidney biopsy).  How is this treated? Treatment for this condition depends on the cause and how severe the condition is. In mild cases, treatment may not be needed. The kidneys may heal on their own. In more severe cases, treatment will involve:  Treating the cause of the kidney injury. This may involve changing any medicines you are taking or adjusting your dosage.  Fluids. You may need specialized IV fluids to balance your body's needs.  Having a catheter placed to drain urine and prevent blockages.  Preventing problems from occurring. This may mean avoiding certain medicines or procedures that can cause further injury to the kidneys.  In some cases treatment may also require:  A procedure to remove toxic wastes from the body (dialysis or continuous renal replacement therapy - CRRT).  Surgery. This may be done to repair a torn kidney, or to remove the blockage from the urinary system.  Follow these instructions at home: Medicines  Take over-the-counter and prescription medicines only as told by your health care provider.  Do not take any new medicines without your health care provider's approval. Many medicines can worsen your kidney damage.  Do not take any vitamin and mineral supplements without your health care provider's approval. Many nutritional supplements can worsen your kidney damage. Lifestyle  If your health care provider prescribed changes to your diet, follow them. You may need to decrease the amount of protein you eat.  Achieve  and maintain a healthy weight. If you need help with this, ask your health care provider.  Start or continue an exercise plan. Try to exercise at least 30 minutes a day, 5 days a week.  Do not use any tobacco products, such as cigarettes, chewing tobacco, and e-cigarettes. If you need help quitting, ask  your health care provider. General instructions  Keep track of your blood pressure. Report changes in your blood pressure as told by your health care provider.  Stay up to date with immunizations. Ask your health care provider which immunizations you need.  Keep all follow-up visits as told by your health care provider. This is important. Where to find more information:  American Association of Kidney Patients: BombTimer.gl  National Kidney Foundation: www.kidney.[place]: https://mathis.com/  Life Options Rehabilitation Program: ? www.lifeoptions.org ? www.kidneyschool.org Contact a health care provider if:  Your symptoms get worse.  You develop new symptoms. Get help right away if:  You develop symptoms of worsening kidney disease, which include: ? Headaches. ? Abnormally dark or light skin. ? Easy bruising. ? Frequent hiccups. ? Chest pain. ? Shortness of breath. ? End of menstruation in women. ? Seizures. ? Confusion or altered mental status. ? Abdominal or back pain. ? Itchiness.  You have a fever.  Your body is producing less urine.  You have pain or bleeding when you urinate. Summary  Acute kidney injury is a sudden worsening of kidney function.  Acute kidney injury can be caused by problems with blood flow to the kidneys, direct damage to the kidneys, and sudden blockage of urine flow.  Symptoms of this condition may not be obvious until it becomes severe. Symptoms may include edema, lethargy, confusion, nausea or vomiting, and problems passing urine.  This condition can usually be diagnosed with blood tests, urine tests, and imaging tests. Sometimes a kidney biopsy is done to diagnose this condition.  Treatment for this condition often involves treating the underlying cause. It is treated with fluids, medicines, dialysis, diet changes, or surgery. This information is not intended to replace advice given to you by your health care provider.  Make sure you discuss any questions you have with your health care provider. Document Released: 07/09/2010 Document Revised: 12/15/2015 Document Reviewed: 12/15/2015 Elsevier Interactive Patient Education  2017 [place] Prevention in the Home Falls can cause injuries and can affect people from all age groups. There are many simple things that you can do to make your home safe and to help prevent falls. What can I do on the outside of my home?  Regularly repair the edges of walkways and driveways and fix any cracks.  Remove high doorway thresholds.  Trim any shrubbery on the main path into your home.  Use bright outdoor lighting.  Clear walkways of debris and clutter, including tools and rocks.  Regularly check that handrails are securely fastened and in good repair. Both sides of any steps should have handrails.  Install guardrails along the edges of any raised decks or porches.  Have leaves, snow, and ice cleared regularly.  Use sand or salt on walkways during winter months.  In the garage, clean up any spills right away, including grease or oil spills. What can I do in the bathroom?  Use night lights.  Install grab bars by the toilet and in the tub and shower. Do not use towel bars as grab bars.  Use non-skid mats or decals on the floor of the tub or shower.  If you need to sit down while you are in the shower, use a plastic, non-slip stool.  Keep the floor dry. Immediately clean up any water that spills on the floor.  Remove soap buildup in the tub or shower on a regular basis.  Attach bath mats securely with double-sided non-slip rug tape.  Remove throw rugs and other tripping hazards from the floor. What can I do in the bedroom?  Use night lights.  Make sure that a bedside light is easy to reach.  Do not use oversized bedding that drapes onto the floor.  Have a firm chair that has side arms to use for getting dressed.  Remove throw rugs and other  tripping hazards from the floor. What can I do in the kitchen?  Clean up any spills right away.  Avoid walking on wet floors.  Place frequently used items in easy-to-reach places.  If you need to reach for something above you, use a sturdy step stool that has a grab bar.  Keep electrical cables out of the way.  Do not use floor polish or wax that makes floors slippery. If you have to use wax, make sure that it is non-skid floor wax.  Remove throw rugs and other tripping hazards from the floor. What can I do in the stairways?  Do not leave any items on the stairs.  Make sure that there are handrails on both sides of the stairs. Fix handrails that are broken or loose. Make sure that handrails are as long as the stairways.  Check any carpeting to make sure that it is firmly attached to the stairs. Fix any carpet that is loose or worn.  Avoid having throw rugs at the top or bottom of stairways, or secure the rugs with carpet tape to prevent them from moving.  Make sure that you have a light switch at the top of the stairs and the bottom of the stairs. If you do not have them, have them installed. What are some other fall prevention tips?  Wear closed-toe shoes that fit well and support your feet. Wear shoes that have rubber soles or low heels.  When you use a stepladder, make sure that it is completely opened and that the sides are firmly locked. Have someone hold the ladder while you are using it. Do not climb a closed stepladder.  Add color or contrast paint or tape to grab bars and handrails in your home. Place contrasting color strips on the first and last steps.  Use mobility aids as needed, such as canes, walkers, scooters, and crutches.  Turn on lights if it is dark. Replace any light bulbs that burn out.  Set up furniture so that there are clear paths. Keep the furniture in the same spot.  Fix any uneven floor surfaces.  Choose a carpet design that does not hide the  edge of steps of a stairway.  Be aware of any and all pets.  Review your medicines with your healthcare provider. Some medicines can cause dizziness or changes in blood pressure, which increase your risk of falling. Talk with your health care provider about other ways that you can decrease your risk of falls. This may include working with a physical therapist or trainer to improve your strength, balance, and endurance. This information is not intended to replace advice given to you by your health care provider. Make sure you discuss any questions you have with your health care provider. Document Released: 12/14/2001 Document Revised: 05/23/2015 Document  Reviewed: 01/28/2014 Elsevier Interactive Patient Education  2017 [employer].

## 2016-08-08 ENCOUNTER — Other Ambulatory Visit: Payer: Self-pay | Admitting: Otolaryngology

## 2016-08-08 DIAGNOSIS — D333 Benign neoplasm of cranial nerves: Secondary | ICD-10-CM

## 2016-09-01 NOTE — Progress Notes (Signed)
Physical Therapy Late Note For G-codes    08/08/2016 04-08-21  PT Visit Information  Last PT Received On 09/05/16  PT G-Codes **NOT FOR INPATIENT CLASS**  Functional Assessment Tool Used AM-PAC 6 Clicks Basic Mobility  Functional Limitation Mobility: Walking and moving around  Mobility: Walking and Moving Around Current Status [phone]) CK  Mobility: Walking and Moving Around Goal Status [id number]) Johnnette Gourd  Carita Pian. Sanjuana Kava, [place] Pager [phone]

## 2016-10-14 ENCOUNTER — Other Ambulatory Visit: Payer: Self-pay | Admitting: Interventional Cardiology

## 2016-10-15 ENCOUNTER — Encounter (HOSPITAL_COMMUNITY): Payer: Medicare Other

## 2016-10-15 ENCOUNTER — Ambulatory Visit: Payer: Medicare Other | Admitting: Vascular Surgery

## 2016-11-06 ENCOUNTER — Encounter (HOSPITAL_COMMUNITY): Payer: Self-pay | Admitting: *Deleted

## 2016-11-06 ENCOUNTER — Observation Stay (HOSPITAL_COMMUNITY)
Admission: EM | Admit: 2016-11-06 | Discharge: 2016-11-08 | Disposition: A | Discharge status: Home / Self Care | Payer: Medicare Other | Attending: Internal Medicine | Admitting: Internal Medicine

## 2016-11-06 DIAGNOSIS — G2581 Restless legs syndrome: Secondary | ICD-10-CM | POA: Insufficient documentation

## 2016-11-06 DIAGNOSIS — M62838 Other muscle spasm: Secondary | ICD-10-CM | POA: Insufficient documentation

## 2016-11-06 DIAGNOSIS — F419 Anxiety disorder, unspecified: Secondary | ICD-10-CM | POA: Diagnosis not present

## 2016-11-06 DIAGNOSIS — R110 Nausea: Secondary | ICD-10-CM | POA: Diagnosis not present

## 2016-11-06 DIAGNOSIS — R42 Dizziness and giddiness: Secondary | ICD-10-CM | POA: Diagnosis not present

## 2016-11-06 DIAGNOSIS — J302 Other seasonal allergic rhinitis: Secondary | ICD-10-CM | POA: Diagnosis not present

## 2016-11-06 DIAGNOSIS — I2510 Atherosclerotic heart disease of native coronary artery without angina pectoris: Secondary | ICD-10-CM | POA: Insufficient documentation

## 2016-11-06 DIAGNOSIS — Z7984 Long term (current) use of oral hypoglycemic drugs: Secondary | ICD-10-CM | POA: Insufficient documentation

## 2016-11-06 DIAGNOSIS — Z7982 Long term (current) use of aspirin: Secondary | ICD-10-CM | POA: Insufficient documentation

## 2016-11-06 DIAGNOSIS — E119 Type 2 diabetes mellitus without complications: Secondary | ICD-10-CM | POA: Diagnosis not present

## 2016-11-06 DIAGNOSIS — K219 Gastro-esophageal reflux disease without esophagitis: Secondary | ICD-10-CM | POA: Insufficient documentation

## 2016-11-06 DIAGNOSIS — D539 Nutritional anemia, unspecified: Secondary | ICD-10-CM | POA: Insufficient documentation

## 2016-11-06 DIAGNOSIS — R5383 Other fatigue: Secondary | ICD-10-CM | POA: Insufficient documentation

## 2016-11-06 DIAGNOSIS — Z951 Presence of aortocoronary bypass graft: Secondary | ICD-10-CM | POA: Insufficient documentation

## 2016-11-06 DIAGNOSIS — Z7902 Long term (current) use of antithrombotics/antiplatelets: Secondary | ICD-10-CM | POA: Insufficient documentation

## 2016-11-06 DIAGNOSIS — D7589 Other specified diseases of blood and blood-forming organs: Secondary | ICD-10-CM | POA: Insufficient documentation

## 2016-11-06 DIAGNOSIS — Z79899 Other long term (current) drug therapy: Secondary | ICD-10-CM | POA: Diagnosis not present

## 2016-11-06 DIAGNOSIS — F329 Major depressive disorder, single episode, unspecified: Secondary | ICD-10-CM | POA: Diagnosis not present

## 2016-11-06 DIAGNOSIS — I10 Essential (primary) hypertension: Secondary | ICD-10-CM | POA: Insufficient documentation

## 2016-11-06 DIAGNOSIS — E860 Dehydration: Secondary | ICD-10-CM | POA: Diagnosis not present

## 2016-11-06 DIAGNOSIS — N179 Acute kidney failure, unspecified: Principal | ICD-10-CM | POA: Diagnosis present

## 2016-11-06 DIAGNOSIS — R799 Abnormal finding of blood chemistry, unspecified: Secondary | ICD-10-CM

## 2016-11-06 DIAGNOSIS — N390 Urinary tract infection, site not specified: Secondary | ICD-10-CM

## 2016-11-06 DIAGNOSIS — E782 Mixed hyperlipidemia: Secondary | ICD-10-CM | POA: Insufficient documentation

## 2016-11-06 DIAGNOSIS — R8290 Unspecified abnormal findings in urine: Secondary | ICD-10-CM | POA: Diagnosis not present

## 2016-11-06 DIAGNOSIS — R682 Dry mouth, unspecified: Secondary | ICD-10-CM | POA: Diagnosis present

## 2016-11-06 NOTE — ED Triage Notes (Signed)
Per EMS, pt from home complains of dry mouth for the past 4 days. Pt states she has been drinking water.  BP 188/94 CBG 175

## 2016-11-06 NOTE — ED Provider Notes (Signed)
[facility] DEPT Provider Note   CSN: [id number] Arrival date & time: 11/06/16  1718     History   Chief Complaint Chief Complaint  Patient presents with  . Dry mouth    HPI Andrea Kirk is a 76 y.o. female with a past medical history of CAD, diabetes, hypertension, anxiety, who presents to ED for evaluation of dry mouth.  She also reports intermittent jitteriness as well as a dry cough.  She states that symptoms began about 4 days ago.  She also reports diarrhea.  She denies any new changes in her medications.  She takes her meclizine about twice a week for her dizziness.  She has not taken it today.  She denies any falls, chest pain, shortness of breath, fevers, urinary symptoms, numbness in legs, head injuries, abdominal pain, vomiting.  She states that she has had a normal appetite.  HPI  Past Medical History:  Diagnosis Date  . Anxiety    takes Celexa daily  . Back pain    occasionally  . Carotid artery occlusion   . Cataract    left and immature  . Coronary artery disease   . Depression   . Diabetes mellitus    takes Metformin and Glipizide daily  . Dizziness    takes Meclizine daily as needed  . GERD (gastroesophageal reflux disease)    takes Omeprazole daily as needed  . Headache(784.0)   . Hyperlipidemia    takes Atorvastatin daily  . Hypertension    takes Carvedilol daily  . Muscle spasm    takes Robaxin daily as needed  . Nausea    takes Phenergan daily as needed  . Pneumonia    hx of-in high school  . Restless leg    takes Requip daily as needed  . Seasonal allergies    takes Claritin daily as needed and Afrin as needed  . Shortness of breath    with exertion  . Urinary urgency     Patient Active Problem List   Diagnosis Date Noted  . Acute kidney injury ([place]) 08/05/2016  . AKI (acute kidney injury) ([place]) 08/04/2016  . Asymptomatic stenosis of right carotid artery 03/22/2016  . Diabetes mellitus ([place])  05/02/2015  . Carotid stenosis 11/16/2013  . Aftercare following surgery of the circulatory system, [place] 05/11/2013  . Carotid artery disease ([place]) 04/16/2013  . Coronary atherosclerosis of native coronary artery 03/11/2013  . Essential hypertension, benign 03/11/2013  . Mixed hyperlipidemia 03/11/2013  . Occlusion and stenosis of carotid artery without mention of cerebral infarction 07/19/2011    Past Surgical History:  Procedure Laterality Date  . COLONOSCOPY WITH PROPOFOL N/A 03/10/2012   Procedure: COLONOSCOPY WITH PROPOFOL;  Surgeon: Garlan Fair, MD;  Location: [facility];  Service: Endoscopy;  Laterality: N/A;  . CORNEAL TRANSPLANT Right   . CORONARY ARTERY BYPASS GRAFT  1997   x 6  . CORONARY ARTERY BYPASS GRAFT  Jan. 1997  . ENDARTERECTOMY Left 04/16/2013   Procedure: Left Carotid Artery Endatarectomy with Resection of Redundant Internal Carotid Artery;  Surgeon: Rosetta Posner, MD;  Location: [facility];  Service: Vascular;  Laterality: Left;  . ENDARTERECTOMY Right 03/22/2016   Procedure: RIGHT ENDARTERECTOMY CAROTID;  Surgeon: Rosetta Posner, MD;  Location: [facility] OR;  Service: Vascular;  Laterality: Right;  . ESOPHAGOGASTRODUODENOSCOPY N/A 03/10/2012   Procedure: ESOPHAGOGASTRODUODENOSCOPY (EGD);  Surgeon: Garlan Fair, MD;  Location: [facility];  Service: Endoscopy;  Laterality: N/A;  . EYE SURGERY  March 12, 2001   CORNEA TRANSPLANT Right eye  . PATCH ANGIOPLASTY Right 03/22/2016   Procedure: PATCH ANGIOPLASTY;  Surgeon: Rosetta Posner, MD;  Location: [facility];  Service: Vascular;  Laterality: Right;  . PR VEIN BYPASS GRAFT,AORTO-FEM-POP  1997  . SPINE SURGERY  march 2013   Back surgery  . TONSILLECTOMY    . TRIGGER FINGER RELEASE Left    thumb    OB History    No data available       Home Medications    Prior to Admission medications   Medication Sig Start Date End Date Taking? Authorizing Provider  acetaminophen (TYLENOL) 500 MG tablet Take 1,000 mg by mouth every 6  (six) hours as needed for moderate pain. For pain    [provider]  Ascorbic Acid (VITAMIN C) 1000 MG tablet Take 1,000 mg by mouth daily.    [provider]  aspirin EC 81 MG tablet Take 81 mg by mouth every evening.     [provider]  atorvastatin (LIPITOR) 40 MG tablet Take 40 mg by mouth daily at 6 PM.     [provider]  Calcium Carbonate-Vitamin D (CALTRATE 600+D PO) Take 1 tablet by mouth daily.    [provider]  carvedilol (COREG) 25 MG tablet TAKE 1 TABLET BY MOUTH 2  TIMES DAILY WITH A MEAL 06/28/16   Jettie Booze, MD  citalopram (CELEXA) 20 MG tablet Take 20 mg by mouth every morning.     [provider]  clopidogrel (PLAVIX) 75 MG tablet Take 75 mg by mouth every morning. Will stop 5 days prior to procedure    [provider]  Cyanocobalamin (B-12) 2500 MCG TABS Take 2,500 mcg by mouth daily.    [provider]  fish oil-omega-3 fatty acids 1000 MG capsule Take 1 g by mouth daily.    [provider]  gabapentin (NEURONTIN) 300 MG capsule Take 300 mg by mouth 4 (four) times daily.  02/28/16   [provider]  glipiZIDE (GLUCOTROL XL) 10 MG 24 hr tablet Take 10 mg by mouth daily with breakfast.     [provider]  lisinopril (PRINIVIL,ZESTRIL) 20 MG tablet TAKE 1 TABLET BY MOUTH  DAILY. KEEP OFFICE VISIT. 10/14/16   Jettie Booze, MD  Magnesium 250 MG TABS Take 250 mg by mouth daily.    [provider]  meclizine (ANTIVERT) 25 MG tablet Take 25 mg by mouth 2 (two) times daily as needed for dizziness. For dizziness     [provider]  metFORMIN (GLUCOPHAGE) 1000 MG tablet Take 1,000 mg by mouth 2 (two) times daily with a meal.  02/09/15   [provider]  methocarbamol (ROBAXIN) 500 MG tablet Take 1 tablet (500 mg total) by mouth every 8 (eight) hours as needed for muscle spasms. 08/06/16   Mikhail, Velta Addison, DO  nitroGLYCERIN (NITROSTAT) 0.4 MG SL  tablet Place 1 tablet (0.4 mg total) under the tongue every 5 (five) minutes as needed. Chest pain. Patient taking differently: Place 0.4 mg under the tongue every 5 (five) minutes as needed for chest pain.  09/28/13   Jettie Booze, MD  pantoprazole (PROTONIX) 40 MG tablet Take 40 mg by mouth daily as needed (INDIGESTION).  01/10/15   [provider]    Family History Family History  Problem Relation Age of Onset  . Heart attack Father   . Heart disease Father 17       Before age of  24  . Hyperlipidemia Father   . Heart disease Brother        Heart dissease before age 54  . Hyperlipidemia Brother   . Cirrhosis Mother   . Heart attack Paternal Grandfather   . Heart disease Paternal Grandfather   . Cancer Maternal Grandmother   . Alcoholism Maternal Grandfather   . Heart attack Paternal Grandmother     Social History Social History  Substance Use Topics  . Smoking status: Never Smoker  . Smokeless tobacco: Never Used  . Alcohol use No     Allergies   Codeine; Latex; and Morphine and related   Review of Systems Review of Systems  Constitutional: Negative for appetite change, chills and fever.       Jitteriness  HENT: Negative for ear pain, rhinorrhea, sneezing and sore throat.        Dry mouth  Eyes: Negative for photophobia and visual disturbance.  Respiratory: Negative for cough, chest tightness, shortness of breath and wheezing.   Cardiovascular: Negative for chest pain and palpitations.  Gastrointestinal: Negative for abdominal pain, blood in stool, constipation, diarrhea, nausea and vomiting.  Genitourinary: Negative for dysuria, hematuria and urgency.  Musculoskeletal: Positive for myalgias.  Skin: Negative for rash.  Neurological: Negative for dizziness, weakness and light-headedness.     Physical Exam Updated Vital Signs BP (!) 175/100 (BP Location: Left Arm)   Pulse 68   Temp 98.5 F (36.9 C) (Oral)   Resp 18   Physical Exam    Constitutional: She is oriented to person, place, and time. She appears well-developed and well-nourished. No distress.  HENT:  Head: Normocephalic and atraumatic.  Nose: Nose normal.  Mouth/Throat: Oropharynx is clear and moist.  Mucous membranes appear moist.  Eyes: Conjunctivae and EOM are normal. Left eye exhibits no discharge. No scleral icterus.  Neck: Normal range of motion. Neck supple.  Cardiovascular: Normal rate, regular rhythm, normal heart sounds and intact distal pulses.  Exam reveals no gallop and no friction rub.   No murmur heard. Pulmonary/Chest: Effort normal and breath sounds normal. No respiratory distress.  Abdominal: Soft. Bowel sounds are normal. She exhibits no distension. There is no tenderness. There is no guarding.  Musculoskeletal: Normal range of motion. She exhibits no edema.  Neurological: She is alert and oriented to person, place, and time. No cranial nerve deficit or sensory deficit. She exhibits normal muscle tone. Coordination normal.  Pupils reactive. No facial asymmetry noted. Cranial nerves appear grossly intact. Sensation intact to light touch on face, BUE and BLE. Strength 5/5 in BUE and BLE.  Skin: Skin is warm and dry. No rash noted.  Psychiatric: She has a normal mood and affect.  Nursing note and vitals reviewed.    ED Treatments / Results  Labs (all labs ordered are listed, but only abnormal results are displayed) Labs Reviewed - No data to display  EKG  EKG Interpretation None       Radiology No results found.  Procedures Procedures (including critical care time)  Medications Ordered in ED Medications - No data to display   Initial Impression / Assessment and Plan / ED Course  I have reviewed the triage vital signs and the nursing notes.  Pertinent labs & imaging results that were available during my care of the patient were reviewed by me and considered in my medical decision making (see chart for details).      Patient, with a past medical history of CAD, diabetes, hypertension, anxiety, who presents to ED  for evaluation of dry mouth, intermittent jitteriness, dry cough for the past week. She denies any changes in appetite, vomiting, falls. On physical exam, she does not appear dehydrated. She denies any chest pain, trouble breathing. She has no deficits on neurological exam. She is alert and oriented, aware of current situation. She works at a Scientist, water quality at [employer]. We will obtain CBC, BMP and urinalysis to rule out dehydration.  We will also obtain chest x-ray for any etiology of her cough.  CBC unremarkable.  BMP shows elevation in BUN and creatinine to 47 and 1.49.  Could be caused by her lisinopril or possible dehydration. She denies any changes in appetite, vomiting. Will give fluid bolus, reassess after CXR and urine result. Care signed out to oncoming provider pending CXR, urine and dispo.  Patient discussed with and seen by Dr. Tomi Bamberger.  Final Clinical Impressions(s) / ED Diagnoses   Final diagnoses:  None    New Prescriptions New Prescriptions   No medications on file     Delia Heady, PA-C 11/07/16 0104    Rolland Porter, MD 11/07/16 [phone]

## 2016-11-07 ENCOUNTER — Encounter (HOSPITAL_COMMUNITY): Payer: Self-pay | Admitting: Internal Medicine

## 2016-11-07 ENCOUNTER — Emergency Department (HOSPITAL_COMMUNITY): Payer: Medicare Other

## 2016-11-07 DIAGNOSIS — N3000 Acute cystitis without hematuria: Secondary | ICD-10-CM | POA: Diagnosis not present

## 2016-11-07 DIAGNOSIS — N179 Acute kidney failure, unspecified: Secondary | ICD-10-CM | POA: Diagnosis not present

## 2016-11-07 DIAGNOSIS — N390 Urinary tract infection, site not specified: Secondary | ICD-10-CM

## 2016-11-07 HISTORY — DX: Acute kidney failure, unspecified: N17.9

## 2016-11-07 HISTORY — DX: Urinary tract infection, site not specified: N39.0

## 2016-11-07 LAB — URINALYSIS, ROUTINE W REFLEX MICROSCOPIC
Bacteria, UA: NONE SEEN
Bilirubin Urine: NEGATIVE
Glucose, UA: NEGATIVE mg/dL
Hgb urine dipstick: NEGATIVE
Ketones, ur: 5 mg/dL — AB
Nitrite: NEGATIVE
Protein, ur: NEGATIVE mg/dL
Specific Gravity, Urine: 1.02 (ref 1.005–1.030)
pH: 5 (ref 5.0–8.0)

## 2016-11-07 LAB — CBC WITH DIFFERENTIAL/PLATELET
Basophils Absolute: 0 10*3/uL (ref 0.0–0.1)
Basophils Relative: 0 %
Eosinophils Absolute: 0.1 10*3/uL (ref 0.0–0.7)
Eosinophils Relative: 1 %
HCT: 35.6 % — ABNORMAL LOW (ref 36.0–46.0)
Hemoglobin: 11.4 g/dL — ABNORMAL LOW (ref 12.0–15.0)
Lymphocytes Relative: 31 %
Lymphs Abs: 2.8 10*3/uL (ref 0.7–4.0)
MCH: 34.5 pg — ABNORMAL HIGH (ref 26.0–34.0)
MCHC: 32 g/dL (ref 30.0–36.0)
MCV: 107.9 fL — ABNORMAL HIGH (ref 78.0–100.0)
Monocytes Absolute: 0.9 10*3/uL (ref 0.1–1.0)
Monocytes Relative: 10 %
Neutro Abs: 5 10*3/uL (ref 1.7–7.7)
Neutrophils Relative %: 58 %
Platelets: 194 10*3/uL (ref 150–400)
RBC: 3.3 MIL/uL — ABNORMAL LOW (ref 3.87–5.11)
RDW: 14 % (ref 11.5–15.5)
WBC: 8.8 10*3/uL (ref 4.0–10.5)

## 2016-11-07 LAB — BASIC METABOLIC PANEL
Anion gap: 11 (ref 5–15)
Anion gap: 9 (ref 5–15)
BUN: 41 mg/dL — ABNORMAL HIGH (ref 6–20)
BUN: 47 mg/dL — ABNORMAL HIGH (ref 6–20)
CO2: 26 mmol/L (ref 22–32)
CO2: 30 mmol/L (ref 22–32)
Calcium: 8.8 mg/dL — ABNORMAL LOW (ref 8.9–10.3)
Calcium: 9.4 mg/dL (ref 8.9–10.3)
Chloride: 104 mmol/L (ref 101–111)
Chloride: 97 mmol/L — ABNORMAL LOW (ref 101–111)
Creatinine, Ser: 1.22 mg/dL — ABNORMAL HIGH (ref 0.44–1.00)
Creatinine, Ser: 1.49 mg/dL — ABNORMAL HIGH (ref 0.44–1.00)
GFR calc Af Amer: 38 mL/min — ABNORMAL LOW (ref >60)
GFR calc Af Amer: 49 mL/min — ABNORMAL LOW (ref >60)
GFR calc non Af Amer: 33 mL/min — ABNORMAL LOW (ref >60)
GFR calc non Af Amer: 42 mL/min — ABNORMAL LOW (ref >60)
Glucose, Bld: 140 mg/dL — ABNORMAL HIGH (ref 65–99)
Glucose, Bld: 146 mg/dL — ABNORMAL HIGH (ref 65–99)
Potassium: 4.5 mmol/L (ref 3.5–5.1)
Potassium: 4.9 mmol/L (ref 3.5–5.1)
Sodium: 138 mmol/L (ref 135–145)
Sodium: 139 mmol/L (ref 135–145)

## 2016-11-07 LAB — GLUCOSE, CAPILLARY
Glucose-Capillary: 161 mg/dL — ABNORMAL HIGH (ref 65–99)
Glucose-Capillary: 211 mg/dL — ABNORMAL HIGH (ref 65–99)
Glucose-Capillary: 149 mg/dL — ABNORMAL HIGH (ref 65–99)
Glucose-Capillary: 183 mg/dL — ABNORMAL HIGH (ref 65–99)

## 2016-11-07 LAB — MRSA PCR SCREENING: MRSA by PCR: POSITIVE — AB

## 2016-11-07 MED ORDER — ACETAMINOPHEN 325 MG PO TABS
650.0000 mg | ORAL_TABLET | Freq: Four times a day (QID) | ORAL | Status: DC | PRN
  Administered 2016-11-07: 650 mg via ORAL
  Filled 2016-11-07: qty 2

## 2016-11-07 MED ORDER — HYDROCODONE-ACETAMINOPHEN 10-325 MG PO TABS
1.0000 | ORAL_TABLET | Freq: Two times a day (BID) | ORAL | Status: DC | PRN
  Administered 2016-11-07 – 2016-11-08 (×3): 1 via ORAL
  Filled 2016-11-07 (×3): qty 1

## 2016-11-07 MED ORDER — SODIUM CHLORIDE 0.9 % IV BOLUS (SEPSIS)
1000.0000 mL | Freq: Once | INTRAVENOUS | Status: AC
  Administered 2016-11-07: 1000 mL via INTRAVENOUS

## 2016-11-07 MED ORDER — LISINOPRIL 20 MG PO TABS: 20.0000 mg | ORAL_TABLET | Freq: Every day | ORAL | Status: DC

## 2016-11-07 MED ORDER — DEXTROSE 5 % IV SOLN
1.0000 g | INTRAVENOUS | Status: DC
  Administered 2016-11-07 – 2016-11-08 (×2): 1 g via INTRAVENOUS
  Filled 2016-11-07 (×2): qty 10

## 2016-11-07 MED ORDER — HYDRALAZINE HCL 20 MG/ML IJ SOLN
10.0000 mg | Freq: Four times a day (QID) | INTRAMUSCULAR | Status: DC | PRN
  Administered 2016-11-07 (×2): 10 mg via INTRAVENOUS
  Filled 2016-11-07 (×2): qty 1

## 2016-11-07 MED ORDER — INSULIN ASPART 100 UNIT/ML ~~LOC~~ SOLN: 0.0000 [IU] | Freq: Every day | SUBCUTANEOUS | Status: DC

## 2016-11-07 MED ORDER — VITAMIN C 500 MG PO TABS
1000.0000 mg | ORAL_TABLET | Freq: Every day | ORAL | Status: DC
  Administered 2016-11-07 – 2016-11-08 (×2): 1000 mg via ORAL
  Filled 2016-11-07 (×2): qty 2

## 2016-11-07 MED ORDER — ENOXAPARIN SODIUM 40 MG/0.4ML ~~LOC~~ SOLN
40.0000 mg | SUBCUTANEOUS | Status: DC
  Administered 2016-11-07 – 2016-11-08 (×2): 40 mg via SUBCUTANEOUS
  Filled 2016-11-07 (×2): qty 0.4

## 2016-11-07 MED ORDER — CLOPIDOGREL BISULFATE 75 MG PO TABS
75.0000 mg | ORAL_TABLET | Freq: Every morning | ORAL | Status: DC
  Administered 2016-11-07 – 2016-11-08 (×2): 75 mg via ORAL
  Filled 2016-11-07 (×2): qty 1

## 2016-11-07 MED ORDER — PANTOPRAZOLE SODIUM 40 MG PO TBEC
40.0000 mg | DELAYED_RELEASE_TABLET | Freq: Every day | ORAL | Status: DC | PRN
  Administered 2016-11-07: 40 mg via ORAL
  Filled 2016-11-07: qty 1

## 2016-11-07 MED ORDER — GABAPENTIN 300 MG PO CAPS
300.0000 mg | ORAL_CAPSULE | Freq: Four times a day (QID) | ORAL | Status: DC
  Administered 2016-11-07 – 2016-11-08 (×5): 300 mg via ORAL
  Filled 2016-11-07 (×5): qty 1

## 2016-11-07 MED ORDER — ATORVASTATIN CALCIUM 40 MG PO TABS
40.0000 mg | ORAL_TABLET | Freq: Every day | ORAL | Status: DC
  Administered 2016-11-07: 40 mg via ORAL
  Filled 2016-11-07: qty 1

## 2016-11-07 MED ORDER — ACETAMINOPHEN 650 MG RE SUPP: 650.0000 mg | Freq: Four times a day (QID) | RECTAL | Status: DC | PRN

## 2016-11-07 MED ORDER — MUPIROCIN 2 % EX OINT
1.0000 "application " | TOPICAL_OINTMENT | Freq: Two times a day (BID) | CUTANEOUS | Status: DC
  Administered 2016-11-07 – 2016-11-08 (×3): 1 via NASAL
  Filled 2016-11-07: qty 22

## 2016-11-07 MED ORDER — GLIPIZIDE ER 10 MG PO TB24: 10.0000 mg | ORAL_TABLET | Freq: Every day | ORAL | Status: DC

## 2016-11-07 MED ORDER — CITALOPRAM HYDROBROMIDE 20 MG PO TABS
20.0000 mg | ORAL_TABLET | Freq: Every morning | ORAL | Status: DC
  Administered 2016-11-07 – 2016-11-08 (×2): 20 mg via ORAL
  Filled 2016-11-07 (×2): qty 1

## 2016-11-07 MED ORDER — CHLORHEXIDINE GLUCONATE CLOTH 2 % EX PADS
6.0000 | MEDICATED_PAD | Freq: Every day | CUTANEOUS | Status: DC
  Administered 2016-11-08: 6 via TOPICAL

## 2016-11-07 MED ORDER — SODIUM CHLORIDE 0.9 % IV SOLN
INTRAVENOUS | Status: AC
  Administered 2016-11-07 – 2016-11-08 (×3): via INTRAVENOUS

## 2016-11-07 MED ORDER — MECLIZINE HCL 25 MG PO TABS: 25.0000 mg | ORAL_TABLET | Freq: Two times a day (BID) | ORAL | Status: DC | PRN

## 2016-11-07 MED ORDER — ASPIRIN EC 81 MG PO TBEC
81.0000 mg | DELAYED_RELEASE_TABLET | Freq: Every evening | ORAL | Status: DC
  Administered 2016-11-07: 81 mg via ORAL
  Filled 2016-11-07: qty 1

## 2016-11-07 MED ORDER — B-12 2500 MCG PO TABS: 2500.0000 ug | ORAL_TABLET | Freq: Every day | ORAL | Status: DC

## 2016-11-07 MED ORDER — OMEGA-3-ACID ETHYL ESTERS 1 G PO CAPS
1.0000 g | ORAL_CAPSULE | Freq: Every day | ORAL | Status: DC
  Administered 2016-11-07 – 2016-11-08 (×2): 1 g via ORAL
  Filled 2016-11-07 (×2): qty 1

## 2016-11-07 MED ORDER — INSULIN ASPART 100 UNIT/ML ~~LOC~~ SOLN
0.0000 [IU] | Freq: Three times a day (TID) | SUBCUTANEOUS | Status: DC
  Administered 2016-11-07: 1 [IU] via SUBCUTANEOUS
  Administered 2016-11-07: 3 [IU] via SUBCUTANEOUS
  Administered 2016-11-07: 2 [IU] via SUBCUTANEOUS
  Administered 2016-11-08: 1 [IU] via SUBCUTANEOUS
  Administered 2016-11-08: 2 [IU] via SUBCUTANEOUS

## 2016-11-07 MED ORDER — VITAMIN B-12 1000 MCG PO TABS
2500.0000 ug | ORAL_TABLET | Freq: Every day | ORAL | Status: DC
  Administered 2016-11-07 – 2016-11-08 (×2): 2500 ug via ORAL
  Filled 2016-11-07 (×2): qty 2

## 2016-11-07 NOTE — Progress Notes (Signed)
[facility] PROGRESS NOTE  Andrea Kirk XTG:[id number] DOB: 04/26/1940 DOA: 11/06/2016  PCP: Aura Dials, MD  Brief History/Interval Summary: 76 year old Caucasian female with a past medical history of hypertension hyperlipidemia type 2 diabetes coronary artery disease presented complaining of fatigue, dry mouth and feeling tremulous.  Patient was noted to be dehydrated with a mild acute renal failure.  She had abnormal UA suggesting UTI.  She was hospitalized for further management.  Reason for Visit: Urinary tract infection.  Acute renal failure.  Consultants: None  Procedures: None  Antibiotics: Ceftriaxone  Subjective/Interval History: Patient states that she is feeling better compared to yesterday.  Feels a little bit stronger.  She denies any diarrhea nausea or vomiting.  Denies any abdominal pain  ROS: No cough, shortness of breath or chest pain.  Objective:  Vital Signs  Vitals:   11/07/16 0600 11/07/16 0730 11/07/16 0835 11/07/16 0952  BP: (!) 173/86 (!) 181/84 (!) 189/75 139/68  Pulse: 81 69 69   Resp: 12 15 16    Temp:   98.1 F (36.7 C)   TempSrc:   Oral   SpO2: (!) 84% 100% 100%   Weight:   81 kg (178 lb 9.2 oz)   Height:   5\' 4"  (1.626 m)     Intake/Output Summary (Last 24 hours) at 11/07/16 1126 Last data filed at 11/07/16 2703  Gross per 24 hour  Intake             2050 ml  Output                0 ml  Net             2050 ml   Filed Weights   11/07/16 0835  Weight: 81 kg (178 lb 9.2 oz)    General appearance: alert, cooperative, appears stated age and no distress Head: Normocephalic, without obvious abnormality, atraumatic Resp: clear to auscultation bilaterally Cardio: regular rate and rhythm, S1, S2 normal, no murmur, click, rub or gallop GI: soft, non-tender; bowel sounds normal; no masses,  no organomegaly Extremities: extremities normal, atraumatic, no cyanosis or edema Neurologic: No tremors noted.  Patient is awake and  alert.  Oriented x3.  Cranial nerves II through XII intact.  Motor strength equal bilateral upper and lower extremities.  Lab Results:  Data Reviewed: I have personally reviewed following labs and imaging studies  CBC:  Recent Labs Lab 11/07/16 0001  WBC 8.8  NEUTROABS 5.0  HGB 11.4*  HCT 35.6*  MCV 107.9*  PLT 500    Basic Metabolic Panel:  Recent Labs Lab 11/07/16 0001 11/07/16 0348  NA 138 139  K 4.9 4.5  CL 97* 104  CO2 30 26  GLUCOSE 140* 146*  BUN 47* 41*  CREATININE 1.49* 1.22*  CALCIUM 9.4 8.8*    GFR: Estimated Creatinine Clearance: 41 mL/min (A) (by C-G formula based on SCr of 1.22 mg/dL (H)).  CBG:  Recent Labs Lab 11/07/16 0848  GLUCAP 161*    Radiology Studies: Dg Chest 2 View  Result Date: 11/07/2016 CLINICAL DATA:  Cough EXAM: CHEST  2 VIEW COMPARISON:  Chest radiograph 02/18/2015 FINDINGS: Median sternotomy wires and CABG markers. Unchanged cardiomediastinal contours. Shallow lung inflation without focal airspace consolidation or pulmonary edema. No pleural effusion or pneumothorax. IMPRESSION: No active cardiopulmonary disease. Electronically Signed   By: Ulyses Jarred M.D.   On: 11/07/2016 01:01     Medications:  Scheduled: . aspirin EC  81 mg Oral QPM  .  atorvastatin  40 mg Oral q1800  . citalopram  20 mg Oral q morning - 10a  . clopidogrel  75 mg Oral q morning - 10a  . enoxaparin (LOVENOX) injection  40 mg Subcutaneous Q24H  . gabapentin  300 mg Oral QID  . insulin aspart  0-5 Units Subcutaneous QHS  . insulin aspart  0-9 Units Subcutaneous TID WC  . omega-3 acid ethyl esters  1 g Oral Daily  . vitamin B-12  2,500 mcg Oral Daily  . vitamin C  1,000 mg Oral Daily   Continuous: . sodium chloride 100 mL/hr at 11/07/16 0546  . cefTRIAXone (ROCEPHIN)  IV Stopped (11/07/16 4462)   MMN:[id number] **OR** acetaminophen, hydrALAZINE, HYDROcodone-acetaminophen, meclizine, pantoprazole  Assessment/Plan:  Principal Problem:   ARF  (acute renal failure) (HCC) Active Problems:   UTI (urinary tract infection)    Acute renal failure Probably due to mild dehydration.  Though the reason for the dehydration is not entirely clear.  Patient denies being started on any new medications recently.  Denies any nausea vomiting or diarrhea.  She has been given IV fluids and creatinine is already improving.  Continue to monitor urine output.  Suspected UTI UA is abnormal.  Patient does mention some dysuria.  Follow-up on urine cultures.  Continue ceftriaxone for now.  Fatigue/Generalized weakness No neurological deficits noted on examination.  Fatigue is most likely due to renal failure and UTI.  PT and OT evaluation.  History of type 2 diabetes Holding metformin.  On SSI.  History of coronary artery disease Stable.  Continue aspirin Plavix beta-blocker ACE inhibitor and statin.  Macrocytic anemia Hemoglobin is close to her baseline.  No evidence for overt bleeding.  TSH B33 and folic acid levels ordered.  DVT Prophylaxis: Lovenox    Code Status: Full code Family Communication: Discussed with the patient Disposition Plan: Management as outlined above.  PT and OT evaluation.    LOS: 0 days   [facility] Pager [phone] 11/07/2016, 11:26 AM  If 7PM-7AM, please contact night-coverage at [email or web address], password [facility]

## 2016-11-07 NOTE — H&P (Signed)
TRH H&P   Patient Demographics:    Andrea Kirk, is a 76 y.o. female  MRN: [id number]   DOB - [birth year]  Admit Date - 11/06/2016  Outpatient Primary MD for the patient is Aura Dials, MD  Referring MD/NP/PA:   [place]  Outpatient Specialists:   Patient coming from: home  Chief Complaint  Patient presents with  . Dry mouth      HPI:    Andrea Kirk  is a 76 y.o. female, w hypertension, hyperlipidemia, Dm2 , CAD apparently c/o dry mouth and slight tremulousness of the hands.  Wasn't feeling well and therefore presented to ED.   In ED,   Na138 K 4.9 Bun 47, Creatinine 1.49 Wbc 8.8, Hgb 11.4, Plt 194  ua 6-30 wbc  Pt will be admitted for w/up of dehydration, mild ARF, and uti        Review of systems:    In addition to the HPI above,  No Fever-chills, No Headache, No changes with Vision or hearing, No problems swallowing food or Liquids, No Chest pain, Cough or Shortness of Breath, No Abdominal pain, No Nausea or Vommitting, Bowel movements are regular, No Blood in stool or Urine, +++   Dysuria today No new skin rashes or bruises, No new joints pains-aches,  No new weakness, tingling, numbness in any extremity, No recent weight gain or loss, No polyuria, polydypsia or polyphagia, No significant Mental Stressors.  A full 10 point Review of Systems was done, except as stated above, all other Review of Systems were negative.   With Past History of the following :    Past Medical History:  Diagnosis Date  . Anxiety    takes Celexa daily  . Back pain    occasionally  . Carotid artery occlusion   . Cataract    left and immature  . Coronary artery disease   . Depression   . Diabetes mellitus    takes Metformin and Glipizide daily  . Dizziness    takes Meclizine daily as needed  . GERD (gastroesophageal reflux disease)    takes  Omeprazole daily as needed  . Headache(784.0)   . Hyperlipidemia    takes Atorvastatin daily  . Hypertension    takes Carvedilol daily  . Muscle spasm    takes Robaxin daily as needed  . Nausea    takes Phenergan daily as needed  . Pneumonia    hx of-in high school  . Restless leg    takes Requip daily as needed  . Seasonal allergies    takes Claritin daily as needed and Afrin as needed  . Shortness of breath    with exertion  . Urinary urgency       Past Surgical History:  Procedure Laterality Date  . COLONOSCOPY WITH PROPOFOL N/A 03/10/2012   Procedure: COLONOSCOPY WITH PROPOFOL;  Surgeon: Garlan Fair, MD;  Location:  [facility];  Service: Endoscopy;  Laterality: N/A;  . CORNEAL TRANSPLANT Right   . CORONARY ARTERY BYPASS GRAFT  1997   x 6  . CORONARY ARTERY BYPASS GRAFT  Jan. 1997  . ENDARTERECTOMY Left 04/16/2013   Procedure: Left Carotid Artery Endatarectomy with Resection of Redundant Internal Carotid Artery;  Surgeon: Rosetta Posner, MD;  Location: [facility];  Service: Vascular;  Laterality: Left;  . ENDARTERECTOMY Right 03/22/2016   Procedure: RIGHT ENDARTERECTOMY CAROTID;  Surgeon: Rosetta Posner, MD;  Location: [facility] OR;  Service: Vascular;  Laterality: Right;  . ESOPHAGOGASTRODUODENOSCOPY N/A 03/10/2012   Procedure: ESOPHAGOGASTRODUODENOSCOPY (EGD);  Surgeon: Garlan Fair, MD;  Location: [facility];  Service: Endoscopy;  Laterality: N/A;  . EYE SURGERY  March 12, 2001   CORNEA TRANSPLANT Right eye  . PATCH ANGIOPLASTY Right 03/22/2016   Procedure: PATCH ANGIOPLASTY;  Surgeon: Rosetta Posner, MD;  Location: [facility];  Service: Vascular;  Laterality: Right;  . PR VEIN BYPASS GRAFT,AORTO-FEM-POP  1997  . SPINE SURGERY  march 2013   Back surgery  . TONSILLECTOMY    . TRIGGER FINGER RELEASE Left    thumb      Social History:     Social History  Substance Use Topics  . Smoking status: Never Smoker  . Smokeless tobacco: Never Used  . Alcohol use No     Lives - at  home  Mobility - walks by self   Family History :     Family History  Problem Relation Age of Onset  . Heart attack Father   . Heart disease Father 5       Before age of 13  . Hyperlipidemia Father   . Heart disease Brother        Heart dissease before age 28  . Hyperlipidemia Brother   . Cirrhosis Mother   . Heart attack Paternal Grandfather   . Heart disease Paternal Grandfather   . Cancer Maternal Grandmother   . Alcoholism Maternal Grandfather   . Heart attack Paternal Grandmother      Home Medications:   Prior to Admission medications   Medication Sig Start Date End Date Taking? Authorizing Provider  acetaminophen (TYLENOL) 325 MG tablet Take 650 mg by mouth every 6 (six) hours as needed for moderate pain.   Yes [provider]  Ascorbic Acid (VITAMIN C) 1000 MG tablet Take 1,000 mg by mouth daily.   Yes [provider]  aspirin EC 81 MG tablet Take 81 mg by mouth every evening.    Yes [provider]  atorvastatin (LIPITOR) 40 MG tablet Take 40 mg by mouth daily at 6 PM.    Yes [provider]  Calcium Carbonate-Vitamin D (CALTRATE 600+D PO) Take 1 tablet by mouth daily.   Yes [provider]  carvedilol (COREG) 25 MG tablet TAKE 1 TABLET BY MOUTH 2  TIMES DAILY WITH A MEAL Patient taking differently: TAKE 25MG   BY MOUTH 2 TIMES DAILY WITH A MEAL 06/28/16  Yes Jettie Booze, MD  citalopram (CELEXA) 20 MG tablet Take 20 mg by mouth every morning.    Yes [provider]  clopidogrel (PLAVIX) 75 MG tablet Take 75 mg by mouth every morning.    Yes [provider]  Cyanocobalamin (B-12) 2500 MCG TABS Take 2,500 mcg by mouth daily.   Yes [provider]  fish oil-omega-3 fatty acids 1000 MG capsule Take 1 g by mouth daily.   Yes [provider]  gabapentin (  NEURONTIN) 300 MG capsule Take 300 mg by mouth 4 (four) times daily.  02/28/16  Yes [provider]  glipiZIDE (GLUCOTROL XL)  10 MG 24 hr tablet Take 10 mg by mouth daily with breakfast.    Yes [provider]  HYDROcodone-acetaminophen (NORCO) 10-325 MG tablet Take 1 tablet by mouth 2 (two) times daily as needed for severe pain.  10/25/16  Yes [provider]  lisinopril (PRINIVIL,ZESTRIL) 20 MG tablet TAKE 1 TABLET BY MOUTH  DAILY. KEEP OFFICE VISIT. Patient taking differently: TAKE 20MG  BY MOUTH  DAILY. 10/14/16  Yes Jettie Booze, MD  meclizine (ANTIVERT) 25 MG tablet Take 25 mg by mouth 2 (two) times daily as needed for dizziness. For dizziness    Yes [provider]  metFORMIN (GLUCOPHAGE) 1000 MG tablet Take 1,000 mg by mouth 2 (two) times daily with a meal.  02/09/15  Yes [provider]  nitroGLYCERIN (NITROSTAT) 0.4 MG SL tablet Place 1 tablet (0.4 mg total) under the tongue every 5 (five) minutes as needed. Chest pain. Patient taking differently: Place 0.4 mg under the tongue every 5 (five) minutes as needed for chest pain.  09/28/13  Yes Jettie Booze, MD  pantoprazole (PROTONIX) 40 MG tablet Take 40 mg by mouth daily as needed (INDIGESTION).  01/10/15  Yes [provider]  methocarbamol (ROBAXIN) 500 MG tablet Take 1 tablet (500 mg total) by mouth every 8 (eight) hours as needed for muscle spasms. Patient not taking: Reported on 11/07/2016 08/06/16   Cristal Ford, DO     Allergies:     Allergies  Allergen Reactions  . Codeine Other (See Comments)    Abnormal behavior  . Latex Other (See Comments)    tears skin  . Morphine And Related Other (See Comments)    Affects BP and blood sugar.  . Cyclobenzaprine     MADE PT SICK- pt unsure if med was cyclobenzaprine or methocarbamol   . Methocarbamol     MADE PT SICK- pt unsure if med was cyclobenzaprine or methocarbamol      Physical Exam:   Vitals  Blood pressure (!) 170/88, pulse 80, temperature 98.2 F (36.8 C), temperature source Oral, resp. rate 18, SpO2 90 %.   1. General  lying in bed  in NAD,    2. Normal affect and insight, Not Suicidal or Homicidal, Awake Alert, Oriented X 3.  3. No F.N deficits, ALL C.Nerves Intact, Strength 5/5 all 4 extremities, Sensation intact all 4 extremities, Plantars down going.  4. Ears and Eyes appear Normal, Conjunctivae clear, PERRLA. Moist Oral Mucosa.  5. Supple Neck, No JVD, No cervical lymphadenopathy appriciated, No Carotid Bruits.  6. Symmetrical Chest wall movement, Good air movement bilaterally, CTAB.  7. RRR, No Gallops, Rubs or Murmurs, No Parasternal Heave.  8. Positive Bowel Sounds, Abdomen Soft, No tenderness, No organomegaly appriciated,No rebound -guarding or rigidity.  9.  No Cyanosis, Normal Skin Turgor, No Skin Rash or Bruise.  10. Good muscle tone,  joints appear normal , no effusions, Normal ROM.  11. No Palpable Lymph Nodes in Neck or Axillae     Data Review:    CBC  Recent Labs Lab 11/07/16 0001  WBC 8.8  HGB 11.4*  HCT 35.6*  PLT 194  MCV 107.9*  MCH 34.5*  MCHC 32.0  RDW 14.0  LYMPHSABS 2.8  MONOABS 0.9  EOSABS 0.1  BASOSABS 0.0   ------------------------------------------------------------------------------------------------------------------  Chemistries   Recent Labs Lab 11/07/16 0001 11/07/16 0348  NA 138 139  K 4.9 4.5  CL 97* 104  CO2 30 26  GLUCOSE 140* 146*  BUN 47* 41*  CREATININE 1.49* 1.22*  CALCIUM 9.4 8.8*   ------------------------------------------------------------------------------------------------------------------ CrCl cannot be calculated (Unknown ideal weight.). ------------------------------------------------------------------------------------------------------------------ No results for input(s): TSH, T4TOTAL, T3FREE, THYROIDAB in the last 72 hours.  Invalid input(s): FREET3  Coagulation profile No results for input(s): INR, PROTIME in the last 168  hours. ------------------------------------------------------------------------------------------------------------------- No results for input(s): DDIMER in the last 72 hours. -------------------------------------------------------------------------------------------------------------------  Cardiac Enzymes No results for input(s): CKMB, TROPONINI, MYOGLOBIN in the last 168 hours.  Invalid input(s): CK ------------------------------------------------------------------------------------------------------------------ No results found for: BNP   ---------------------------------------------------------------------------------------------------------------  Urinalysis    Component Value Date/Time   COLORURINE YELLOW 11/07/2016 0048   APPEARANCEUR CLEAR 11/07/2016 0048   LABSPEC 1.020 11/07/2016 0048   PHURINE 5.0 11/07/2016 0048   GLUCOSEU NEGATIVE 11/07/2016 0048   HGBUR NEGATIVE 11/07/2016 0048   BILIRUBINUR NEGATIVE 11/07/2016 0048   KETONESUR 5 (A) 11/07/2016 0048   PROTEINUR NEGATIVE 11/07/2016 0048   UROBILINOGEN 1.0 06/17/2014 2145   NITRITE NEGATIVE 11/07/2016 0048   LEUKOCYTESUR TRACE (A) 11/07/2016 0048    ----------------------------------------------------------------------------------------------------------------   Imaging Results:    Dg Chest 2 View  Result Date: 11/07/2016 CLINICAL DATA:  Cough EXAM: CHEST  2 VIEW COMPARISON:  Chest radiograph 02/18/2015 FINDINGS: Median sternotomy wires and CABG markers. Unchanged cardiomediastinal contours. Shallow lung inflation without focal airspace consolidation or pulmonary edema. No pleural effusion or pneumothorax. IMPRESSION: No active cardiopulmonary disease. Electronically Signed   By: Ulyses Jarred M.D.   On: 11/07/2016 01:01      Assessment & Plan:    Principal Problem:   ARF (acute renal failure) (HCC) Active Problems:   UTI (urinary tract infection)    ARF, mild dehydration Hydrate with ns iv Check cmp  in am  UTI Urine culture Start on rocephin 1gm iv qday  Anemia Repeat cbc in am  Macrocytosis (pt denies etoh use on regular basis) Check b12, folate, tsh  Dm2 fsbs ac and qhs, ISS HOLD METFORMIN  CAD Cont aspirin, plavix, carvedilol, lisinopril, atorvastatin  DVT Prophylaxis  Lovenox - SCDs   AM Labs Ordered, also please review Full Orders  Family Communication: Admission, patients condition and plan of care including tests being ordered have been discussed with the patient who indicate understanding and agree with the plan and Code Status.  Code Status FULL CODE  Likely DC to  home  Condition GUARDED    Consults called: none  Admission status: observation   Time spent in minutes : 45   Jani Gravel M.D on 11/07/2016 at 6:08 AM  Between 7am to 7pm - Pager - [phone]  . After 7pm go to [email or web address] - password [facility]  [facility] - Office  [phone]

## 2016-11-07 NOTE — ED Notes (Signed)
Pt states that she will attempt to urinate soon.

## 2016-11-07 NOTE — ED Provider Notes (Signed)
Patient reports she has been having weakness and shakiness that got worse today.  She states it started about 3 days ago.  She states she has had normal appetite but her mouth is dry and she states she is having normal urination.  She states she was recently admitted for hypertension and a high potassium.  She states she works at [employer] and has to stand all day long.  Patient is awake and alert, she has very mild shaking when the nursing staff is doing her blood pressures.  Her tongue appears minimally moist to me.  She is awake and alert and seems to be aware of her surroundings.  Medical screening examination/treatment/procedure(s) were conducted as a shared visit with non-physician practitioner(s) and myself.  I personally evaluated the patient during the encounter.   EKG Interpretation None       Rolland Porter, MD, Barbette Or, MD 11/07/16 [phone]

## 2016-11-07 NOTE — ED Notes (Signed)
Please call Deidre Ala with report at [facility] [phone]). Andre Lefort

## 2016-11-07 NOTE — Evaluation (Signed)
Physical Therapy Evaluation Patient Details Name: Andrea Kirk MRN: [id number] DOB: [birth year] Today's Date: 11/07/2016   History of Present Illness  76 yo female admitted with ARF. hx of DM, CAD, HTN, dizziness-takes meclizine, RLS, back pain, falls  Clinical Impression  On eval, pt was Min guard assist for mobility. She walked ~165 feet with a RW. She tolerated activity well. No LOB with RW use on today. At baseline, pt uses a cane. She did c/o R calf pain with ambulation. Encouraged pt to mention this to MD. Discussed d/c plan-pt plans to return home. She is agreeable to HHPT f/u. Will follow during hospital stay.     Follow Up Recommendations Home health PT    Equipment Recommendations  None recommended by PT    Recommendations for Other Services       Precautions / Restrictions Precautions Precautions: Fall Restrictions Weight Bearing Restrictions: No      Mobility  Bed Mobility Overal bed mobility: Modified Independent                Transfers Overall transfer level: Modified independent                  Ambulation/Gait Ambulation/Gait assistance: Min guard Ambulation Distance (Feet): 165 Feet Assistive device: Rolling walker (2 wheeled) Gait Pattern/deviations: Step-through pattern;Decreased stride length     General Gait Details: close guard for safety. fair gait speed. No LOB with RW use.   Stairs            Wheelchair Mobility    Modified Rankin (Stroke Patients Only)       Balance Overall balance assessment: Needs assistance           Standing balance-Leahy Scale: Fair                               Pertinent Vitals/Pain Pain Assessment: 0-10 Pain Score: 5  Pain Location: R calf with ambulation Pain Descriptors / Indicators: Sore Pain Intervention(s): Monitored during session    Home Living Family/patient expects to be discharged to:: Private residence Living Arrangements: Alone Available Help at  Discharge: Family;Friend(s);Available PRN/intermittently Type of Home: Apartment Home Access: Level entry     Home Layout: One level Home Equipment: Walker - 2 wheels;Cane - single point;Grab bars - tub/shower      Prior Function Level of Independence: Independent with assistive device(s)         Comments: Patient uses cane for ambulation.  Patient drives. Works a part-time job.     Hand Dominance        Extremity/Trunk Assessment   Upper Extremity Assessment Upper Extremity Assessment: Overall WFL for tasks assessed    Lower Extremity Assessment Lower Extremity Assessment: Generalized weakness    Cervical / Trunk Assessment Cervical / Trunk Assessment: Normal  Communication   Communication: No difficulties  Cognition Arousal/Alertness: Awake/alert Behavior During Therapy: WFL for tasks assessed/performed Overall Cognitive Status: Within Functional Limits for tasks assessed                                        General Comments      Exercises     Assessment/Plan    PT Assessment Patient needs continued PT services  PT Problem List Decreased mobility;Pain;Decreased balance;Decreased activity tolerance;Decreased strength       PT Treatment Interventions DME instruction;Gait training;Therapeutic  activities;Therapeutic exercise;Patient/family education;Functional mobility training;Balance training    PT Goals (Current goals can be found in the Care Plan section)  Acute Rehab PT Goals Patient Stated Goal: home. to transition to Ind Living in near future (currently undergoing application process with [facility]) PT Goal Formulation: With patient Time For Goal Achievement: 11/21/16 Potential to Achieve Goals: Good    Frequency Min 3X/week   Barriers to discharge        Co-evaluation               AM-PAC PT "6 Clicks" Daily Activity  Outcome Measure Difficulty turning over in bed (including adjusting bedclothes, sheets and  blankets)?: None Difficulty moving from lying on back to sitting on the side of the bed? : None Difficulty sitting down on and standing up from a chair with arms (e.g., wheelchair, bedside commode, etc,.)?: None Help needed moving to and from a bed to chair (including a wheelchair)?: A Little Help needed walking in hospital room?: A Little Help needed climbing 3-5 steps with a railing? : A Little 6 Click Score: 21    End of Session Equipment Utilized During Treatment: Gait belt Activity Tolerance: Patient tolerated treatment well Patient left: in bed;with call bell/phone within reach   PT Visit Diagnosis: Muscle weakness (generalized) (M62.81);Difficulty in walking, not elsewhere classified (R26.2)    Time: 5537-4827 PT Time Calculation (min) (ACUTE ONLY): 13 min   Charges:   PT Evaluation $PT Eval Moderate Complexity: 1 Mod     PT G Codes:   PT G-Codes **NOT FOR INPATIENT CLASS** Functional Assessment Tool Used: AM-PAC 6 Clicks Basic Mobility;Clinical judgement Functional Limitation: Mobility: Walking and moving around Mobility: Walking and Moving Around Current Status (M7867): At least 1 percent but less than 20 percent impaired, limited or restricted Mobility: Walking and Moving Around Goal Status [id number]): At least 1 percent but less than 20 percent impaired, limited or restricted     Weston Anna, MPT Pager: [phone]

## 2016-11-07 NOTE — Care Management Note (Signed)
[facility] Management Note  Patient Details  Name: Andrea Kirk [place] MRN: [id number] Date of Birth: August 03, 1940  Subjective/Objective:  PT recc HHPT. Proived patient w/HHC agency list-used [facility] in past,chose them again-TC [facility] rep Drew-able to accept.HHPT ordered. Await d/c order.                Action/Plan:d/c home w/HHPT.   Expected Discharge Date:                  Expected Discharge Plan:  [place]  In-House Referral:     Discharge planning Services  CM Consult  Post Acute Care Choice:    Choice offered to:  Patient  DME Arranged:    DME Agency:     HH Arranged:  PT [place]:  [facility]  Status of Service:  In process, will continue to follow  If discussed at Long Length of Stay Meetings, dates discussed:    Additional Comments:  Dessa Phi, RN 11/07/2016, 4:01 PM

## 2016-11-07 NOTE — Care Management Obs Status (Signed)
[place] NOTIFICATION   Patient Details  Name: Andrea Kirk MRN: [id number] Date of Birth: [birth year]   Medicare Observation Status Notification Given:  Yes    MahabirJuliann Pulse, RN 11/07/2016, 1:12 PM

## 2016-11-07 NOTE — ED Provider Notes (Signed)
Care assumed from [facility], [place].  Please see her full H&P.  Andrea Kirk is a 76 y.o. female resents to the emergency department complaining of weakness and shakiness that worsened today.  She also complains of dry mouth.  She reports a normal appetite and normal urination.  Record review shows that she was recently admitted for hypertension, hyperkalemia and AK I.  She reports she is normally on her feet all day.  She does take lisinopril.   Physical Exam  BP (!) 153/85 (BP Location: Left Arm)   Pulse 77   Temp 98.5 F (36.9 C) (Oral)   Resp 16   SpO2 94%   Physical Exam  Constitutional: She appears well-developed and well-nourished. No distress.  HENT:  Head: Normocephalic.  Eyes: Conjunctivae are normal. No scleral icterus.  Neck: Normal range of motion.  Cardiovascular: Normal rate and intact distal pulses.   Pulmonary/Chest: Effort normal.  Musculoskeletal: Normal range of motion.  Neurological: She is alert.  Skin: Skin is warm and dry.  Nursing note and vitals reviewed.   Results for orders placed or performed during the hospital encounter of 00/93/81  Basic metabolic panel  Result Value Ref Range   Sodium 138 135 - 145 mmol/L   Potassium 4.9 3.5 - 5.1 mmol/L   Chloride 97 (L) 101 - 111 mmol/L   CO2 30 22 - 32 mmol/L   Glucose, Bld 140 (H) 65 - 99 mg/dL   BUN 47 (H) 6 - 20 mg/dL   Creatinine, Ser 1.49 (H) 0.44 - 1.00 mg/dL   Calcium 9.4 8.9 - 10.3 mg/dL   GFR calc non Af Amer 33 (L) >60 mL/min   GFR calc Af Amer 38 (L) >60 mL/min   Anion gap 11 5 - 15  CBC with Differential  Result Value Ref Range   WBC 8.8 4.0 - 10.5 K/uL   RBC 3.30 (L) 3.87 - 5.11 MIL/uL   Hemoglobin 11.4 (L) 12.0 - 15.0 g/dL   HCT 35.6 (L) 36.0 - 46.0 %   MCV 107.9 (H) 78.0 - 100.0 fL   MCH 34.5 (H) 26.0 - 34.0 pg   MCHC 32.0 30.0 - 36.0 g/dL   RDW 14.0 11.5 - 15.5 %   Platelets 194 150 - 400 K/uL   Neutrophils Relative % 58 %   Neutro Abs 5.0 1.7 - 7.7 K/uL   Lymphocytes Relative  31 %   Lymphs Abs 2.8 0.7 - 4.0 K/uL   Monocytes Relative 10 %   Monocytes Absolute 0.9 0.1 - 1.0 K/uL   Eosinophils Relative 1 %   Eosinophils Absolute 0.1 0.0 - 0.7 K/uL   Basophils Relative 0 %   Basophils Absolute 0.0 0.0 - 0.1 K/uL  Urinalysis, Routine w reflex microscopic  Result Value Ref Range   Color, Urine YELLOW YELLOW   APPearance CLEAR CLEAR   Specific Gravity, Urine 1.020 1.005 - 1.030   pH 5.0 5.0 - 8.0   Glucose, UA NEGATIVE NEGATIVE mg/dL   Hgb urine dipstick NEGATIVE NEGATIVE   Bilirubin Urine NEGATIVE NEGATIVE   Ketones, ur 5 (A) NEGATIVE mg/dL   Protein, ur NEGATIVE NEGATIVE mg/dL   Nitrite NEGATIVE NEGATIVE   Leukocytes, UA TRACE (A) NEGATIVE   RBC / HPF 0-5 0 - 5 RBC/hpf   WBC, UA 6-30 0 - 5 WBC/hpf   Bacteria, UA NONE SEEN NONE SEEN   Squamous Epithelial / LPF 0-5 (A) NONE SEEN   Mucus PRESENT    Hyaline Casts, UA PRESENT  Basic metabolic panel  Result Value Ref Range   Sodium 139 135 - 145 mmol/L   Potassium 4.5 3.5 - 5.1 mmol/L   Chloride 104 101 - 111 mmol/L   CO2 26 22 - 32 mmol/L   Glucose, Bld 146 (H) 65 - 99 mg/dL   BUN 41 (H) 6 - 20 mg/dL   Creatinine, Ser 1.22 (H) 0.44 - 1.00 mg/dL   Calcium 8.8 (L) 8.9 - 10.3 mg/dL   GFR calc non Af Amer 42 (L) >60 mL/min   GFR calc Af Amer 49 (L) >60 mL/min   Anion gap 9 5 - 15   Dg Chest 2 View  Result Date: 11/07/2016 CLINICAL DATA:  Cough EXAM: CHEST  2 VIEW COMPARISON:  Chest radiograph 02/18/2015 FINDINGS: Median sternotomy wires and CABG markers. Unchanged cardiomediastinal contours. Shallow lung inflation without focal airspace consolidation or pulmonary edema. No pleural effusion or pneumothorax. IMPRESSION: No active cardiopulmonary disease. Electronically Signed   By: Ulyses Jarred M.D.   On: 11/07/2016 01:01     ED Course  Procedures  Clinical Course as of Nov 08 511  Thu Nov 07, 2016  0204 Plan:  Pt is receiving fluids.  Her potassium is normal today.  Will repeat BMP after fluids to  assess her AKI.    [HM]  0458 Pt with minimal decrease in Serum creatinine after 2L fluids.  Her BUN remains high.  Pt will need admission for AKI.    [HM]  7948 Discussed with Dr. Maudie Mercury who will admit to obs.    [HM]    Clinical Course User Index [HM] Nashea Chumney, Gwenlyn Perking     MDM  Patient presents with several complaints and lab work shows AKI.  Patient's lab abnormalities do not clear after 2 L of fluid.  She will need labs to ensure that her AKI improves.  The patient was discussed with and seen by Dr. Tomi Bamberger who agrees with the treatment plan.    AKI (acute kidney injury) ([place])  Elevated BUN     Rayon Mcchristian, Gwenlyn Perking 11/07/16 0165    Rolland Porter, MD 11/07/16 [phone]

## 2016-11-07 NOTE — Care Management Note (Signed)
[facility] Management Note  Patient Details  Name: Andrea Kirk MRN: [id number] Date of Birth: 30-Jul-1940  Subjective/Objective:76 y/o f admitted w/ARF. From home. PT cons-await recc.                    Action/Plan:d/c plan home.   Expected Discharge Date:                  Expected Discharge Plan:  Home/Self Care  In-House Referral:     Discharge planning Services  CM Consult  Post Acute Care Choice:    Choice offered to:     DME Arranged:    DME Agency:     HH Arranged:    HH Agency:     Status of Service:  In process, will continue to follow  If discussed at Long Length of Stay Meetings, dates discussed:    Additional Comments:  Dessa Phi, RN 11/07/2016, 1:12 PM

## 2016-11-07 NOTE — Progress Notes (Signed)
MRSA PCR positive. Standing positive MRSA PCR orders initiated.  Patient informed.

## 2016-11-08 DIAGNOSIS — N179 Acute kidney failure, unspecified: Secondary | ICD-10-CM | POA: Diagnosis not present

## 2016-11-08 LAB — COMPREHENSIVE METABOLIC PANEL
ALT: 28 U/L (ref 14–54)
AST: 28 U/L (ref 15–41)
Albumin: 3.4 g/dL — ABNORMAL LOW (ref 3.5–5.0)
Alkaline Phosphatase: 58 U/L (ref 38–126)
Anion gap: 7 (ref 5–15)
BUN: 22 mg/dL — ABNORMAL HIGH (ref 6–20)
CO2: 28 mmol/L (ref 22–32)
Calcium: 9.1 mg/dL (ref 8.9–10.3)
Chloride: 106 mmol/L (ref 101–111)
Creatinine, Ser: 0.74 mg/dL (ref 0.44–1.00)
GFR calc Af Amer: >60 mL/min (ref >60)
GFR calc non Af Amer: >60 mL/min (ref >60)
Glucose, Bld: 153 mg/dL — ABNORMAL HIGH (ref 65–99)
Potassium: 4.7 mmol/L (ref 3.5–5.1)
Sodium: 141 mmol/L (ref 135–145)
Total Bilirubin: 0.6 mg/dL (ref 0.3–1.2)
Total Protein: 6.2 g/dL — ABNORMAL LOW (ref 6.5–8.1)

## 2016-11-08 LAB — CBC
HCT: 34.6 % — ABNORMAL LOW (ref 36.0–46.0)
Hemoglobin: 11 g/dL — ABNORMAL LOW (ref 12.0–15.0)
MCH: 34.5 pg — ABNORMAL HIGH (ref 26.0–34.0)
MCHC: 31.8 g/dL (ref 30.0–36.0)
MCV: 108.5 fL — ABNORMAL HIGH (ref 78.0–100.0)
Platelets: 185 10*3/uL (ref 150–400)
RBC: 3.19 MIL/uL — ABNORMAL LOW (ref 3.87–5.11)
RDW: 14.5 % (ref 11.5–15.5)
WBC: 5.4 10*3/uL (ref 4.0–10.5)

## 2016-11-08 LAB — VITAMIN B12: Vitamin B-12: 4227 pg/mL — ABNORMAL HIGH (ref 180–914)

## 2016-11-08 LAB — URINE CULTURE
Culture: <10000 — AB
Special Requests: NORMAL

## 2016-11-08 LAB — GLUCOSE, CAPILLARY
Glucose-Capillary: 144 mg/dL — ABNORMAL HIGH (ref 65–99)
Glucose-Capillary: 168 mg/dL — ABNORMAL HIGH (ref 65–99)

## 2016-11-08 LAB — TSH: TSH: 0.909 u[IU]/mL (ref 0.350–4.500)

## 2016-11-08 MED ORDER — AMLODIPINE BESYLATE 5 MG PO TABS: 5.0000 mg | ORAL_TABLET | Freq: Every day | ORAL | 0 refills | Status: DC

## 2016-11-08 MED ORDER — CEPHALEXIN 500 MG PO CAPS
500.0000 mg | ORAL_CAPSULE | Freq: Three times a day (TID) | ORAL | Status: DC
  Administered 2016-11-08: 500 mg via ORAL
  Filled 2016-11-08: qty 1

## 2016-11-08 MED ORDER — CARVEDILOL 25 MG PO TABS
25.0000 mg | ORAL_TABLET | Freq: Two times a day (BID) | ORAL | Status: DC
  Administered 2016-11-08: 25 mg via ORAL
  Filled 2016-11-08: qty 1

## 2016-11-08 MED ORDER — CEPHALEXIN 500 MG PO CAPS: 500.0000 mg | ORAL_CAPSULE | Freq: Three times a day (TID) | ORAL | 0 refills | Status: AC

## 2016-11-08 MED ORDER — AMLODIPINE BESYLATE 5 MG PO TABS
5.0000 mg | ORAL_TABLET | Freq: Every day | ORAL | Status: DC
  Administered 2016-11-08: 5 mg via ORAL
  Filled 2016-11-08: qty 1

## 2016-11-08 NOTE — Progress Notes (Signed)
Physical Therapy Treatment Patient Details Name: Andrea Kirk MRN: [id number] DOB: 08/09/1940 Today's Date: 11/08/2016    History of Present Illness 76 yo female admitted with ARF. hx of DM, CAD, HTN, dizziness-takes meclizine, RLS, back pain, falls    PT Comments    Ambulated 150 feet with a SPC with min guard. Instructed on SPC on left side with a step through pattern. C/o pain in the R calf that diminished with rest. Positioned in the recliner.   Follow Up Recommendations  Home health PT     Equipment Recommendations  None recommended by PT    Recommendations for Other Services       Precautions / Restrictions Precautions Precautions: Fall Restrictions Weight Bearing Restrictions: No    Mobility  Bed Mobility Overal bed mobility: Modified Independent                Transfers Overall transfer level: Modified independent Equipment used: Straight cane                Ambulation/Gait Ambulation/Gait assistance: Min guard Ambulation Distance (Feet): 150 Feet Assistive device: Straight cane Gait Pattern/deviations: Step-through pattern;Decreased stride length     General Gait Details: 25% VC's for technique with SPC. No LOB with SPC.    Stairs            Wheelchair Mobility    Modified Rankin (Stroke Patients Only)       Balance                                            Cognition Arousal/Alertness: Awake/alert Behavior During Therapy: WFL for tasks assessed/performed Overall Cognitive Status: Within Functional Limits for tasks assessed                                        Exercises      General Comments        Pertinent Vitals/Pain Pain Assessment: 0-10 Pain Score: 2  Pain Location: R calf with ambulation Pain Descriptors / Indicators: Sore;Aching Pain Intervention(s): Monitored during session;Repositioned    Home Living Family/patient expects to be discharged to:: Private  residence Living Arrangements: Alone           Home Equipment: Environmental consultant - 2 wheels;Cane - single point;Grab bars - tub/shower      Prior Function Level of Independence: Independent with assistive device(s)      Comments: Patient uses cane for ambulation.  Patient drives. Works a part-time job.   PT Goals (current goals can now be found in the care plan section) Acute Rehab PT Goals Patient Stated Goal: home. to transition to Ind Living in near future (currently undergoing application process with [employer])    Frequency    Min 3X/week      PT Plan      Co-evaluation              AM-PAC PT "6 Clicks" Daily Activity  Outcome Measure  Difficulty turning over in bed (including adjusting bedclothes, sheets and blankets)?: None Difficulty moving from lying on back to sitting on the side of the bed? : None Difficulty sitting down on and standing up from a chair with arms (e.g., wheelchair, bedside commode, etc,.)?: None Help needed moving to and from a bed to chair (including a wheelchair)?:  A Little Help needed walking in hospital room?: A Little Help needed climbing 3-5 steps with a railing? : A Little 6 Click Score: 21    End of Session Equipment Utilized During Treatment: Gait belt Activity Tolerance: Patient tolerated treatment well Patient left: in chair;with call bell/phone within reach;with chair alarm set Nurse Communication: Mobility status PT Visit Diagnosis: Muscle weakness (generalized) (M62.81);Difficulty in walking, not elsewhere classified (R26.2)     Time: 1110-1135 PT Time Calculation (min) (ACUTE ONLY): 25 min  Charges:  $Gait Training: 8-22 mins $Therapeutic Activity: 8-22 mins                    G Codes:       Almond Lint, SPTA [facility] [place]  PTA [facility]  Acute  Rehab Pager      [phone]

## 2016-11-08 NOTE — Discharge Summary (Signed)
[facility]  Physician Discharge Summary   Patient ID: Andrea Kirk MRN: [id number] DOB/AGE: 04-06-1940 76 y.o.  Admit date: 11/06/2016 Discharge date: 11/08/2016  PCP: Aura Dials, MD  DISCHARGE DIAGNOSES:  Principal Problem:   ARF (acute renal failure) ([place]) Active Problems:   UTI (urinary tract infection)   RECOMMENDATIONS FOR OUTPATIENT FOLLOW UP: 1. Lisinopril has been discontinued.   2. Patient will benefit from blood work at follow-up to check renal function. 3. Folic acid level is pending   DISCHARGE CONDITION: fair  Diet recommendation: Modified carbohydrate  Filed Weights   11/07/16 0835 11/08/16 0431  Weight: 81 kg (178 lb 9.2 oz) 82.8 kg (182 lb 8.7 oz)    INITIAL HISTORY: 76 year old Caucasian female with a past medical history of hypertension hyperlipidemia type 2 diabetes coronary artery disease presented complaining of fatigue, dry mouth and feeling tremulous.  Patient was noted to be dehydrated with a mild acute renal failure.  She had abnormal UA suggesting UTI.  She was hospitalized for further management.   HOSPITAL COURSE:   Acute renal failure Probably due to mild dehydration.  Though the reason for the dehydration is not entirely clear.  Patient denies being started on any new medications recently.  Denies any nausea vomiting or diarrhea.    Patient was treated with IV fluids.  Renal function is now back to normal.    Suspected UTI UA is abnormal.  Patient did mention dysuria.  She was given ceftriaxone.  She will be changed over to Keflex and discharged on the same.  Urine culture was without any significant growth.  Fatigue/Generalized weakness No neurological deficits noted on examination.  Fatigue is most likely due to renal failure and UTI.    Home health will be ordered.  History of type 2 diabetes Okay to resume home medications.  History of coronary artery disease Stable.  Continue aspirin Plavix beta-blocker ACE  inhibitor and statin.  Macrocytic anemia Hemoglobin is close to her baseline.  No evidence for overt bleeding.  TSH 0.9.  B12 4227.  Folic acid level is pending  Patient feels better.  Still somewhat weak.  She is medically stable for discharge.  Return to work after 1 week.   PERTINENT LABS:  The results of significant diagnostics from this hospitalization (including imaging, microbiology, ancillary and laboratory) are listed below for reference.    Microbiology: Recent Results (from the past 240 hour(s))  Culture, Urine     Status: Abnormal   Collection Time: 11/07/16  8:02 AM  Result Value Ref Range Status   Specimen Description URINE, CLEAN CATCH  Final   Special Requests Normal  Final   Culture (A)  Final    <10,000 COLONIES/mL INSIGNIFICANT GROWTH Performed at [facility], [address], [place], [place] [address]    Report Status 11/08/2016 FINAL  Final  MRSA PCR Screening     Status: Abnormal   Collection Time: 11/07/16  9:48 AM  Result Value Ref Range Status   MRSA by PCR POSITIVE (A) NEGATIVE Final    Comment:        The GeneXpert MRSA Assay (FDA approved for NASAL specimens only), is one component of a comprehensive MRSA colonization surveillance program. It is not intended to diagnose MRSA infection nor to guide or monitor treatment for MRSA infections. RESULT CALLED TO, READ BACK BY AND VERIFIED WITH: H.HOLDERNESS AT 1504 ON 11/07/16 BY N.THOMPSON      Labs: Basic Metabolic Panel:  Recent Labs Lab 11/07/16 0001  11/07/16 0348 11/08/16 0516  NA 138 139 141  K 4.9 4.5 4.7  CL 97* 104 106  CO2 30 26 28   GLUCOSE 140* 146* 153*  BUN 47* 41* 22*  CREATININE 1.49* 1.22* 0.74  CALCIUM 9.4 8.8* 9.1   Liver Function Tests:  Recent Labs Lab 11/08/16 0516  AST 28  ALT 28  ALKPHOS 58  BILITOT 0.6  PROT 6.2*  ALBUMIN 3.4*   CBC:  Recent Labs Lab 11/07/16 0001 11/08/16 0516  WBC 8.8 5.4  NEUTROABS 5.0  --   HGB 11.4* 11.0*  HCT  35.6* 34.6*  MCV 107.9* 108.5*  PLT 194 185    CBG:  Recent Labs Lab 11/07/16 1137 11/07/16 1726 11/07/16 2122 11/08/16 0737 11/08/16 1141  GLUCAP 211* 149* 183* 144* 168*     IMAGING STUDIES Dg Chest 2 View  Result Date: 11/07/2016 CLINICAL DATA:  Cough EXAM: CHEST  2 VIEW COMPARISON:  Chest radiograph 02/18/2015 FINDINGS: Median sternotomy wires and CABG markers. Unchanged cardiomediastinal contours. Shallow lung inflation without focal airspace consolidation or pulmonary edema. No pleural effusion or pneumothorax. IMPRESSION: No active cardiopulmonary disease. Electronically Signed   By: Ulyses Jarred M.D.   On: 11/07/2016 01:01    DISCHARGE EXAMINATION: Vitals:   11/07/16 1707 11/07/16 2039 11/08/16 0431 11/08/16 0752  BP: (!) 167/92 (!) 186/93 (!) 158/92 (!) 155/88  Pulse:  95 86 84  Resp:  18    Temp:  97.8 F (36.6 C) 97.6 F (36.4 C)   TempSrc:  Oral Oral   SpO2:  93%    Weight:   82.8 kg (182 lb 8.7 oz)   Height:       General appearance: alert, cooperative, appears stated age and no distress Resp: clear to auscultation bilaterally Cardio: regular rate and rhythm, S1, S2 normal, no murmur, click, rub or gallop GI: soft, non-tender; bowel sounds normal; no masses,  no organomegaly Extremities: extremities normal, atraumatic, no cyanosis or edema  DISPOSITION: Home with home health  Discharge Instructions    Call MD for:  extreme fatigue    Complete by:  As directed    Call MD for:  persistant dizziness or light-headedness    Complete by:  As directed    Call MD for:  persistant nausea and vomiting    Complete by:  As directed    Call MD for:  severe uncontrolled pain    Complete by:  As directed    Call MD for:  temperature >100.4    Complete by:  As directed    Diet - low sodium heart healthy    Complete by:  As directed    Discharge instructions    Complete by:  As directed    Please take your medications as prescribed.  Please note changes made  to medications especially your blood pressure medications.  Please be sure to follow-up with your primary care provider for repeat blood work.  You were cared for by a hospitalist during your hospital stay. If you have any questions about your discharge medications or the care you received while you were in the hospital after you are discharged, you can call the unit and asked to speak with the hospitalist on call if the hospitalist that took care of you is not available. Once you are discharged, your primary care physician will handle any further medical issues. Please note that NO REFILLS for any discharge medications will be authorized once you are discharged, as it is imperative that you  return to your primary care physician (or establish a relationship with a primary care physician if you do not have one) for your aftercare needs so that they can reassess your need for medications and monitor your lab values. If you do not have a primary care physician, you can call [phone] for a physician referral.   Increase activity slowly    Complete by:  As directed       ALLERGIES:  Allergies  Allergen Reactions  . Codeine Other (See Comments)    Abnormal behavior  . Latex Other (See Comments)    tears skin  . Morphine And Related Other (See Comments)    Affects BP and blood sugar.  . Cyclobenzaprine     MADE PT SICK- pt unsure if med was cyclobenzaprine or methocarbamol   . Methocarbamol     MADE PT SICK- pt unsure if med was cyclobenzaprine or methocarbamol      Discharge Medication List as of 11/08/2016 12:23 PM    START taking these medications   Details  amLODipine (NORVASC) 5 MG tablet Take 1 tablet (5 mg total) by mouth daily., Starting Fri 11/08/2016, Print    cephALEXin (KEFLEX) 500 MG capsule Take 1 capsule (500 mg total) by mouth every 8 (eight) hours., Starting Fri 11/08/2016, Until Tue 11/12/2016, Print      CONTINUE these medications which have NOT CHANGED   Details    acetaminophen (TYLENOL) 325 MG tablet Take 650 mg by mouth every 6 (six) hours as needed for moderate pain., Historical Med    Ascorbic Acid (VITAMIN C) 1000 MG tablet Take 1,000 mg by mouth daily., Until Discontinued, Historical Med    aspirin EC 81 MG tablet Take 81 mg by mouth every evening. , Historical Med    atorvastatin (LIPITOR) 40 MG tablet Take 40 mg by mouth daily at 6 PM. , Historical Med    Calcium Carbonate-Vitamin D (CALTRATE 600+D PO) Take 1 tablet by mouth daily., Until Discontinued, Historical Med    carvedilol (COREG) 25 MG tablet TAKE 1 TABLET BY MOUTH 2  TIMES DAILY WITH A MEAL, Normal    citalopram (CELEXA) 20 MG tablet Take 20 mg by mouth every morning. , Until Discontinued, Historical Med    clopidogrel (PLAVIX) 75 MG tablet Take 75 mg by mouth every morning. , Historical Med    Cyanocobalamin (B-12) 2500 MCG TABS Take 2,500 mcg by mouth daily., Historical Med    fish oil-omega-3 fatty acids 1000 MG capsule Take 1 g by mouth daily., Until Discontinued, Historical Med    gabapentin (NEURONTIN) 300 MG capsule Take 300 mg by mouth 4 (four) times daily. , Starting Wed 02/28/2016, Historical Med    glipiZIDE (GLUCOTROL XL) 10 MG 24 hr tablet Take 10 mg by mouth daily with breakfast. , Historical Med    HYDROcodone-acetaminophen (NORCO) 10-325 MG tablet Take 1 tablet by mouth 2 (two) times daily as needed for severe pain. , Starting Fri 10/25/2016, Historical Med    meclizine (ANTIVERT) 25 MG tablet Take 25 mg by mouth 2 (two) times daily as needed for dizziness. For dizziness , Historical Med    metFORMIN (GLUCOPHAGE) 1000 MG tablet Take 1,000 mg by mouth 2 (two) times daily with a meal. , Starting 02/09/2015, Until Discontinued, Historical Med    methocarbamol (ROBAXIN) 500 MG tablet Take 1 tablet (500 mg total) by mouth every 8 (eight) hours as needed for muscle spasms., Starting Tue 08/06/2016, Normal    nitroGLYCERIN (NITROSTAT) 0.4 MG SL tablet  Place 1 tablet  (0.4 mg total) under the tongue every 5 (five) minutes as needed. Chest pain., Starting 09/28/2013, Until Discontinued, Normal    pantoprazole (PROTONIX) 40 MG tablet Take 40 mg by mouth daily as needed (INDIGESTION). , Starting Tue 01/10/2015, Historical Med      STOP taking these medications     lisinopril (PRINIVIL,ZESTRIL) 20 MG tablet          Follow-up Information    [place] [place] Follow up.   Specialty:  [place] Why:  [facility] physical therapy Contact information: [place] [address] [phone]        Aura Dials, MD. Schedule an appointment as soon as possible for a visit in 1 week(s).   Specialty:  Family Medicine Why:  Needs repeat basic metabolic panel Contact information: [place] [address] [phone]           TOTAL DISCHARGE TIME: 35 minutes  [facility] Pager [phone]  11/08/2016, 2:55 PM

## 2016-11-08 NOTE — Discharge Instructions (Signed)
Acute Kidney Injury, Adult Acute kidney injury is a sudden worsening of kidney function. The kidneys are organs that have several jobs. They filter the blood to remove waste products and extra fluid. They also maintain a healthy balance of minerals and hormones in the body, which helps control blood pressure and keep bones strong. With this condition, your kidneys do not do their jobs as well as they should. This condition ranges from mild to severe. Over time it may develop into long-lasting (chronic) kidney disease. Early detection and treatment may prevent acute kidney injury from developing into a chronic condition. What are the causes? Common causes of this condition include:  A problem with blood flow to the kidneys. This may be caused by:  Low blood pressure (hypotension) or shock.  Blood loss.  Heart and blood vessel (cardiovascular) disease.  Severe burns.  Liver disease.  Direct damage to the kidneys. This may be caused by:  Certain medicines.  A kidney infection.  Poisoning.  Being around or in contact with toxic substances.  A surgical wound.  A hard, direct hit to the kidney area.  A sudden blockage of urine flow. This may be caused by:  Cancer.  Kidney stones.  An enlarged prostate in males. What are the signs or symptoms? Symptoms of this condition may not be obvious until the condition becomes severe. Symptoms of this condition can include:  Tiredness (lethargy), or difficulty staying awake.  Nausea or vomiting.  Swelling (edema) of the face, legs, ankles, or feet.  Problems with urination, such as:  Abdominal pain, or pain along the side of your stomach (flank).  Decreased urine production.  Decrease in the force of urine flow.  Muscle twitches and cramps, especially in the legs.  Confusion or trouble concentrating.  Loss of appetite.  Fever. How is this diagnosed? This condition may be diagnosed with tests, including:  Blood  tests.  Urine tests.  Imaging tests.  A test in which a sample of tissue is removed from the kidneys to be examined under a microscope (kidney biopsy). How is this treated? Treatment for this condition depends on the cause and how severe the condition is. In mild cases, treatment may not be needed. The kidneys may heal on their own. In more severe cases, treatment will involve:  Treating the cause of the kidney injury. This may involve changing any medicines you are taking or adjusting your dosage.  Fluids. You may need specialized IV fluids to balance your body's needs.  Having a catheter placed to drain urine and prevent blockages.  Preventing problems from occurring. This may mean avoiding certain medicines or procedures that can cause further injury to the kidneys. In some cases treatment may also require:  A procedure to remove toxic wastes from the body (dialysis or continuous renal replacement therapy - CRRT).  Surgery. This may be done to repair a torn kidney, or to remove the blockage from the urinary system. Follow these instructions at home: Medicines   Take over-the-counter and prescription medicines only as told by your health care provider.  Do not take any new medicines without your health care provider's approval. Many medicines can worsen your kidney damage.  Do not take any vitamin and mineral supplements without your health care provider's approval. Many nutritional supplements can worsen your kidney damage. Lifestyle   If your health care provider prescribed changes to your diet, follow them. You may need to decrease the amount of protein you eat.  Achieve and maintain a   healthy weight. If you need help with this, ask your health care provider.  Start or continue an exercise plan. Try to exercise at least 30 minutes a day, 5 days a week.  Do not use any tobacco products, such as cigarettes, chewing tobacco, and e-cigarettes. If you need help quitting, ask  your health care provider. General instructions   Keep track of your blood pressure. Report changes in your blood pressure as told by your health care provider.  Stay up to date with immunizations. Ask your health care provider which immunizations you need.  Keep all follow-up visits as told by your health care provider. This is important. Where to find more information:  American Association of Kidney Patients: www.aakp.org  National Kidney Foundation: www.kidney.org  American Kidney Fund: www.akfinc.org  Life Options Rehabilitation Program:  www.lifeoptions.org  www.kidneyschool.org Contact a health care provider if:  Your symptoms get worse.  You develop new symptoms. Get help right away if:  You develop symptoms of worsening kidney disease, which include:  Headaches.  Abnormally dark or light skin.  Easy bruising.  Frequent hiccups.  Chest pain.  Shortness of breath.  End of menstruation in women.  Seizures.  Confusion or altered mental status.  Abdominal or back pain.  Itchiness.  You have a fever.  Your body is producing less urine.  You have pain or bleeding when you urinate. Summary  Acute kidney injury is a sudden worsening of kidney function.  Acute kidney injury can be caused by problems with blood flow to the kidneys, direct damage to the kidneys, and sudden blockage of urine flow.  Symptoms of this condition may not be obvious until it becomes severe. Symptoms may include edema, lethargy, confusion, nausea or vomiting, and problems passing urine.  This condition can usually be diagnosed with blood tests, urine tests, and imaging tests. Sometimes a kidney biopsy is done to diagnose this condition.  Treatment for this condition often involves treating the underlying cause. It is treated with fluids, medicines, dialysis, diet changes, or surgery. This information is not intended to replace advice given to you by your health care provider.  Make sure you discuss any questions you have with your health care provider. Document Released: 07/09/2010 Document Revised: 12/15/2015 Document Reviewed: 12/15/2015 Elsevier Interactive Patient Education  2017 [employer].  

## 2016-11-08 NOTE — Progress Notes (Signed)
Pt given discharge teaching and medications reviewed with Pt including new medications and schedules. Pt verbalized understanding of all discharge teaching and home instructions. Pt discharged to home via cab since Pt has no ride availability. Discharge packet with Prescriptions and work note with Pt at time of discharge

## 2016-11-08 NOTE — Evaluation (Signed)
Occupational Therapy Evaluation Patient Details Name: Andrea Kirk MRN: [id number] DOB: 1940-07-30 Today's Date: 11/08/2016    History of Present Illness 76 yo female admitted with ARF. hx of DM, CAD, HTN, dizziness-takes meclizine, RLS, back pain, falls   Clinical Impression   Pt was admitted for the above.  She is currently needs set up vs min guard to gather clothes. She was a little unsteady walking but had no LOB. Will follow in acute setting with mod I level goals    Follow Up Recommendations  No OT follow up    Equipment Recommendations  None recommended by OT    Recommendations for Other Services       Precautions / Restrictions Precautions Precautions: Fall Restrictions Weight Bearing Restrictions: No      Mobility Bed Mobility Overal bed mobility: Modified Independent                Transfers Overall transfer level: Modified independent                    Balance                                           ADL either performed or assessed with clinical judgement   ADL Overall ADL's : Needs assistance/impaired Eating/Feeding: Independent   Grooming: Standing;Set up   Upper Body Bathing: Set up;Sitting   Lower Body Bathing: Set up;Sit to/from stand   Upper Body Dressing : Set up;Sitting   Lower Body Dressing: Set up;Sit to/from stand   Toilet Transfer: Min guard;Ambulation             General ADL Comments: min guard for safety. Pt a little unsteady but no LOB.  She has AD at home. Pt has a dog and he uses inside [employer] box to supplement walks. 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  Talked about energy conservation and pt states she is familiar with this     Vision         Perception     Praxis      Pertinent Vitals/Pain Pain Assessment: No/denies pain     Hand Dominance     Extremity/Trunk Assessment Upper Extremity Assessment Upper Extremity Assessment: Overall WFL for tasks  assessed           Communication Communication Communication: No difficulties   Cognition Arousal/Alertness: Awake/alert Behavior During Therapy: WFL for tasks assessed/performed Overall Cognitive Status: Within Functional Limits for tasks assessed                                     General Comments       Exercises     Shoulder Instructions      Home Living Family/patient expects to be discharged to:: Private residence Living Arrangements: Alone                 Bathroom Shower/Tub: Teacher, early years/pre: Standard     Home Equipment: Environmental consultant - 2 wheels;Cane - single point;Grab bars - tub/shower          Prior Functioning/Environment Level of Independence: Independent with assistive device(s)        Comments: Patient uses cane for ambulation.  Patient drives. Works a part-time job.  OT Problem List: Decreased activity tolerance;Impaired balance (sitting and/or standing)      OT Treatment/Interventions: Self-care/ADL training;Energy conservation;Balance training    OT Goals(Current goals can be found in the care plan section) Acute Rehab OT Goals Patient Stated Goal: home. to transition to Ind Living in near future (currently undergoing application process with [employer]) OT Goal Formulation: With patient Time For Goal Achievement: 11/15/16 Potential to Achieve Goals: Good ADL Goals Pt Will Transfer to Toilet: with modified independence;ambulating;regular height toilet Additional ADL Goal #1: pt will gather clothes at mod I level  OT Frequency: Min 2X/week   Barriers to D/C:            Co-evaluation              AM-PAC PT "6 Clicks" Daily Activity     Outcome Measure Help from another person eating meals?: None Help from another person taking care of personal grooming?: A Little Help from another person toileting, which includes using toliet, bedpan, or urinal?: A Little Help from another person bathing  (including washing, rinsing, drying)?: A Little Help from another person to put on and taking off regular upper body clothing?: A Little Help from another person to put on and taking off regular lower body clothing?: A Little 6 Click Score: 19   End of Session    Activity Tolerance: Patient tolerated treatment well Patient left: in bed;with call bell/phone within reach;with bed alarm set  OT Visit Diagnosis: Unsteadiness on feet (R26.81)                Time: 5643-3295 OT Time Calculation (min): 16 min Charges:  OT General Charges $OT Visit: 1 Visit OT Evaluation $OT Eval Low Complexity: 1 Low G-Codes: OT G-codes **NOT FOR INPATIENT CLASS** Functional Assessment Tool Used: Clinical judgement Functional Limitation: Self care Self Care Current Status (J8841): At least 1 percent but less than 20 percent impaired, limited or restricted Self Care Goal Status (Y6063): 0 percent impaired, limited or restricted   Lesle Chris, OTR/L [phone] 11/08/2016  Blas Riches 11/08/2016, 11:48 AM

## 2016-11-11 LAB — FOLATE RBC
Folate, Hemolysate: 501.1 ng/mL
Folate, RBC: 1420 ng/mL (ref >498)
Hematocrit: 35.3 % (ref 34.0–46.6)

## 2016-12-10 ENCOUNTER — Encounter (HOSPITAL_COMMUNITY): Payer: Medicare Other

## 2016-12-10 ENCOUNTER — Ambulatory Visit: Payer: Medicare Other | Admitting: Vascular Surgery

## 2017-02-04 ENCOUNTER — Encounter (HOSPITAL_COMMUNITY): Payer: Medicare Other

## 2017-02-04 ENCOUNTER — Ambulatory Visit: Payer: Medicare Other | Admitting: Vascular Surgery

## 2017-03-18 ENCOUNTER — Encounter (HOSPITAL_COMMUNITY): Payer: Medicare Other

## 2017-03-18 ENCOUNTER — Ambulatory Visit: Payer: Medicare Other | Admitting: Vascular Surgery

## 2017-04-08 ENCOUNTER — Other Ambulatory Visit: Payer: Self-pay | Admitting: Physician Assistant

## 2017-04-08 DIAGNOSIS — R9389 Abnormal findings on diagnostic imaging of other specified body structures: Secondary | ICD-10-CM

## 2017-04-18 ENCOUNTER — Other Ambulatory Visit: Payer: Medicare Other

## 2017-04-29 ENCOUNTER — Ambulatory Visit
Admission: RE | Admit: 2017-04-29 | Discharge: 2017-04-29 | Disposition: A | Discharge status: Home / Self Care | Payer: Medicare Other | Source: Ambulatory Visit | Attending: Physician Assistant | Admitting: Physician Assistant

## 2017-04-29 DIAGNOSIS — R9389 Abnormal findings on diagnostic imaging of other specified body structures: Secondary | ICD-10-CM

## 2017-04-29 MED ORDER — IOPAMIDOL (ISOVUE-300) INJECTION 61%
75.0000 mL | Freq: Once | INTRAVENOUS | Status: AC | PRN
  Administered 2017-04-29: 75 mL via INTRAVENOUS

## 2017-05-13 ENCOUNTER — Ambulatory Visit (HOSPITAL_COMMUNITY)
Admission: RE | Admit: 2017-05-13 | Discharge: 2017-05-13 | Disposition: A | Discharge status: Home / Self Care | Payer: Medicare Other | Source: Ambulatory Visit | Attending: Vascular Surgery | Admitting: Vascular Surgery

## 2017-05-13 ENCOUNTER — Other Ambulatory Visit: Payer: Self-pay

## 2017-05-13 ENCOUNTER — Encounter: Payer: Self-pay | Admitting: Vascular Surgery

## 2017-05-13 ENCOUNTER — Ambulatory Visit: Payer: Medicare Other | Admitting: Vascular Surgery

## 2017-05-13 VITALS — BP 127/73 | HR 69 | Temp 100.0°F | Resp 16 | Ht 63.5 in | Wt 162.0 lb

## 2017-05-13 DIAGNOSIS — E785 Hyperlipidemia, unspecified: Secondary | ICD-10-CM | POA: Diagnosis not present

## 2017-05-13 DIAGNOSIS — E119 Type 2 diabetes mellitus without complications: Secondary | ICD-10-CM | POA: Diagnosis not present

## 2017-05-13 DIAGNOSIS — I6522 Occlusion and stenosis of left carotid artery: Secondary | ICD-10-CM | POA: Insufficient documentation

## 2017-05-13 DIAGNOSIS — Z09 Encounter for follow-up examination after completed treatment for conditions other than malignant neoplasm: Secondary | ICD-10-CM | POA: Diagnosis not present

## 2017-05-13 DIAGNOSIS — Z9889 Other specified postprocedural states: Secondary | ICD-10-CM | POA: Diagnosis present

## 2017-05-13 DIAGNOSIS — I6523 Occlusion and stenosis of bilateral carotid arteries: Secondary | ICD-10-CM

## 2017-05-13 DIAGNOSIS — I10 Essential (primary) hypertension: Secondary | ICD-10-CM | POA: Diagnosis not present

## 2017-05-13 NOTE — Progress Notes (Signed)
Established Carotid Patient   History of Present Illness   Andrea Kirk is a 77 y.o. (01-12-1940) female who presents to go over carotid duplex study.  She is status post left carotid endarterectomy 04/2013 and most recently had right carotid endarterectomy 03/2016.  Since last office visit she denies strokelike symptoms including slurring speech, changes in vision, or one-sided weakness.  She continues to take her aspirin and statin daily.  She denies tobacco use  Patient states she has been hospitalized several times since last office visit due to frequent falls associated with dizziness caused by dehydration and muscle relaxer medication per patient.  The patient and her family are trying to apply for an assisted living facility in the area.   Current Outpatient Medications  Medication Sig Dispense Refill  . acetaminophen (TYLENOL) 325 MG tablet Take 650 mg by mouth every 6 (six) hours as needed for moderate pain.    Marland Kitchen amLODipine (NORVASC) 5 MG tablet Take 1 tablet (5 mg total) by mouth daily. 30 tablet 0  . Ascorbic Acid (VITAMIN C) 1000 MG tablet Take 1,000 mg by mouth daily.    Marland Kitchen aspirin EC 81 MG tablet Take 81 mg by mouth every evening.     Marland Kitchen atorvastatin (LIPITOR) 40 MG tablet Take 40 mg by mouth daily at 6 PM.     . Calcium Carbonate-Vitamin D (CALTRATE 600+D PO) Take 1 tablet by mouth daily.    . carvedilol (COREG) 25 MG tablet TAKE 1 TABLET BY MOUTH 2  TIMES DAILY WITH A MEAL (Patient taking differently: TAKE 25MG   BY MOUTH 2 TIMES DAILY WITH A MEAL) 180 tablet 3  . citalopram (CELEXA) 20 MG tablet Take 20 mg by mouth every morning.     . clopidogrel (PLAVIX) 75 MG tablet Take 75 mg by mouth every morning.     . Cyanocobalamin (B-12) 2500 MCG TABS Take 2,500 mcg by mouth daily.    Marland Kitchen FERROUS SULFATE PO Take 325 mg by mouth daily.    . fish oil-omega-3 fatty acids 1000 MG capsule Take 1 g by mouth daily.    Marland Kitchen gabapentin (NEURONTIN) 300 MG capsule Take 300 mg by mouth 4 (four)  times daily.     Marland Kitchen glipiZIDE (GLUCOTROL XL) 10 MG 24 hr tablet Take 10 mg by mouth daily with breakfast.     . HYDROcodone-acetaminophen (NORCO) 10-325 MG tablet Take 1 tablet by mouth 2 (two) times daily as needed for severe pain.     Marland Kitchen ibuprofen (ADVIL,MOTRIN) 400 MG tablet Take 400 mg by mouth every 6 (six) hours as needed.    . Magnesium 250 MG TABS Take 250 mg by mouth daily.    . meclizine (ANTIVERT) 25 MG tablet Take 25 mg by mouth 2 (two) times daily as needed for dizziness. For dizziness     . metFORMIN (GLUCOPHAGE) 1000 MG tablet Take 1,000 mg by mouth 2 (two) times daily with a meal.     . methocarbamol (ROBAXIN) 500 MG tablet Take 1 tablet (500 mg total) by mouth every 8 (eight) hours as needed for muscle spasms. 30 tablet 0  . nitroGLYCERIN (NITROSTAT) 0.4 MG SL tablet Place 1 tablet (0.4 mg total) under the tongue every 5 (five) minutes as needed. Chest pain. (Patient taking differently: Place 0.4 mg under the tongue every 5 (five) minutes as needed for chest pain. ) 25 tablet 5  . pantoprazole (PROTONIX) 40 MG tablet Take 40 mg by mouth daily as needed (INDIGESTION).     Marland Kitchen  phenazopyridine (PYRIDIUM) 100 MG tablet Take 100 mg by mouth 3 (three) times daily as needed for pain.    . promethazine (PHENERGAN) 12.5 MG tablet Take 12.5 mg by mouth every 6 (six) hours as needed for nausea or vomiting.    . tamsulosin (FLOMAX) 0.4 MG CAPS capsule      No current facility-administered medications for this visit.     On ROS today: 10 system ROS is negative unless otherwise noted in HPI   Physical Examination   Vitals:   05/13/17 1536 05/13/17 1548  BP: 126/77 127/73  Pulse: 72 69  Resp: 16   Temp: 100 F (37.8 C)   TempSrc: Oral   SpO2: 92%   Weight: 162 lb (73.5 kg)   Height: 5' 3.5" (1.613 m)    Body mass index is 28.25 kg/m.  General Alert, O x 3, WD, NAD  Neck Supple, mid-line trachea,    Pulmonary Sym exp, good B air movt,   Cardiac RRR, Nl S1, S2,   Vascular Vessel  Right Left  Radial Palpable Faintly palpable  Brachial Palpable Palpable  Carotid Palpable, No Bruit Palpable, No Bruit  Aorta Not palpable N/A    Gastro- intestinal soft, non-distended, non-tender to palpation,   Musculo- skeletal M/S 5/5 throughout  , Extremities without ischemic changes    Neurologic Cranial nerves 2-12 intact     Non-Invasive Vascular Imaging   B Carotid Duplex (05/13/17):   R ICA stenosis:  no evidence of restenosis  R VA:  patent and antegrade  L ICA stenosis:  1-39%  L VA:  patent and antegrade   Medical Decision Making   Andrea Kirk is a 77 y.o. female who presents for follow-up status post right CEA by Dr. Donnetta Hutching 03/2016.   Based on carotid duplex today patient has widely patent bilateral internal carotid artery surgical sites without evidence of any restenosis.  Duplex also demonstrates patent and antegrade vertebral arteries bilaterally without evidence of subclavian steal  Continue aspirin and statin regimen daily  Follow regularly with PCP for management of chronic medical conditions  Recheck carotid duplex in 1 year per protocol   Andrea Ligas, PA-C [facility] Office: [phone]  I have examined the patient, reviewed and agree with above.  Curt Jews, MD 05/13/2017 4:39 PM

## 2017-05-14 ENCOUNTER — Other Ambulatory Visit: Payer: Self-pay | Admitting: Interventional Cardiology

## 2017-05-22 ENCOUNTER — Telehealth: Payer: Self-pay | Admitting: *Deleted

## 2017-05-22 NOTE — Telephone Encounter (Signed)
I received a call from Dr. Sheryn Bison at [facility] yesterday to get clarification as to whether Dr. 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 Mrs. Dieterich was seen in our office on 05-13-17 for year follow up (on Right CEA done 03-22-16 by Dr. Donnetta Hutching) and she was supposed to have given [place] a note from Dr. Evette Doffing to review this CT in respect to the left subclavian. I spoke to [place], [place] and he said that she did not give them a note nor told them about Dr. Autumn Patty request. Catalina Antigua then reviewed the CT and said that it showed antegrade flow in the vertebrals and his physical exam of patient showed good palpable pulses in both radial arteries. Due to these findings, Catalina Antigua said that we would not need to address this stenosis at this time.   I have tried multiple times to call Dr. Evette Doffing back at his office phone number of [phone] but the number is out of service. I even tried to call on my personal cell phone but still got the message that the number has been changed, disconnected or out of service; hopefully he will see this note.

## 2017-06-02 ENCOUNTER — Encounter (HOSPITAL_COMMUNITY): Payer: Self-pay

## 2017-06-02 ENCOUNTER — Emergency Department (HOSPITAL_COMMUNITY)
Admission: EM | Admit: 2017-06-02 | Discharge: 2017-06-02 | Disposition: A | Discharge status: Home / Self Care | Payer: Medicare Other | Attending: Emergency Medicine | Admitting: Emergency Medicine

## 2017-06-02 ENCOUNTER — Emergency Department (HOSPITAL_COMMUNITY): Payer: Medicare Other

## 2017-06-02 DIAGNOSIS — Z7902 Long term (current) use of antithrombotics/antiplatelets: Secondary | ICD-10-CM | POA: Insufficient documentation

## 2017-06-02 DIAGNOSIS — Z7982 Long term (current) use of aspirin: Secondary | ICD-10-CM | POA: Insufficient documentation

## 2017-06-02 DIAGNOSIS — Z951 Presence of aortocoronary bypass graft: Secondary | ICD-10-CM | POA: Diagnosis not present

## 2017-06-02 DIAGNOSIS — E119 Type 2 diabetes mellitus without complications: Secondary | ICD-10-CM | POA: Insufficient documentation

## 2017-06-02 DIAGNOSIS — I10 Essential (primary) hypertension: Secondary | ICD-10-CM | POA: Diagnosis not present

## 2017-06-02 DIAGNOSIS — Z91040 Latex allergy status: Secondary | ICD-10-CM | POA: Diagnosis not present

## 2017-06-02 DIAGNOSIS — I2510 Atherosclerotic heart disease of native coronary artery without angina pectoris: Secondary | ICD-10-CM | POA: Insufficient documentation

## 2017-06-02 DIAGNOSIS — Z7984 Long term (current) use of oral hypoglycemic drugs: Secondary | ICD-10-CM | POA: Diagnosis not present

## 2017-06-02 DIAGNOSIS — R05 Cough: Secondary | ICD-10-CM | POA: Diagnosis not present

## 2017-06-02 DIAGNOSIS — R059 Cough, unspecified: Secondary | ICD-10-CM

## 2017-06-02 LAB — BASIC METABOLIC PANEL
Anion gap: 8 (ref 5–15)
BUN: 19 mg/dL (ref 6–20)
CO2: 32 mmol/L (ref 22–32)
Calcium: 9.4 mg/dL (ref 8.9–10.3)
Chloride: 100 mmol/L — ABNORMAL LOW (ref 101–111)
Creatinine, Ser: 0.72 mg/dL (ref 0.44–1.00)
GFR calc Af Amer: >60 mL/min (ref >60)
GFR calc non Af Amer: >60 mL/min (ref >60)
Glucose, Bld: 197 mg/dL — ABNORMAL HIGH (ref 65–99)
Potassium: 4.1 mmol/L (ref 3.5–5.1)
Sodium: 140 mmol/L (ref 135–145)

## 2017-06-02 LAB — CBC WITH DIFFERENTIAL/PLATELET
Abs Immature Granulocytes: 0 10*3/uL (ref 0.0–0.1)
Basophils Absolute: 0 10*3/uL (ref 0.0–0.1)
Basophils Relative: 1 %
Eosinophils Absolute: 0.2 10*3/uL (ref 0.0–0.7)
Eosinophils Relative: 4 %
HCT: 35.4 % — ABNORMAL LOW (ref 36.0–46.0)
Hemoglobin: 11.6 g/dL — ABNORMAL LOW (ref 12.0–15.0)
Immature Granulocytes: 0 %
Lymphocytes Relative: 32 %
Lymphs Abs: 1.5 10*3/uL (ref 0.7–4.0)
MCH: 34 pg (ref 26.0–34.0)
MCHC: 32.8 g/dL (ref 30.0–36.0)
MCV: 103.8 fL — ABNORMAL HIGH (ref 78.0–100.0)
Monocytes Absolute: 0.4 10*3/uL (ref 0.1–1.0)
Monocytes Relative: 9 %
Neutro Abs: 2.6 10*3/uL (ref 1.7–7.7)
Neutrophils Relative %: 54 %
Platelets: 146 10*3/uL — ABNORMAL LOW (ref 150–400)
RBC: 3.41 MIL/uL — ABNORMAL LOW (ref 3.87–5.11)
RDW: 12 % (ref 11.5–15.5)
WBC: 4.8 10*3/uL (ref 4.0–10.5)

## 2017-06-02 MED ORDER — CARVEDILOL 12.5 MG PO TABS
25.0000 mg | ORAL_TABLET | Freq: Two times a day (BID) | ORAL | Status: DC
  Administered 2017-06-02: 25 mg via ORAL
  Filled 2017-06-02: qty 2

## 2017-06-02 MED ORDER — METHYLPREDNISOLONE SODIUM SUCC 125 MG IJ SOLR
125.0000 mg | Freq: Once | INTRAMUSCULAR | Status: AC
  Administered 2017-06-02: 125 mg via INTRAVENOUS
  Filled 2017-06-02: qty 2

## 2017-06-02 MED ORDER — BENZONATATE 100 MG PO CAPS: 100.0000 mg | ORAL_CAPSULE | Freq: Three times a day (TID) | ORAL | 0 refills | Status: DC

## 2017-06-02 MED ORDER — AZITHROMYCIN 250 MG PO TABS: ORAL_TABLET | ORAL | 0 refills | Status: DC

## 2017-06-02 MED ORDER — ALBUTEROL SULFATE HFA 108 (90 BASE) MCG/ACT IN AERS: 1.0000 | INHALATION_SPRAY | Freq: Four times a day (QID) | RESPIRATORY_TRACT | 0 refills | Status: DC | PRN

## 2017-06-02 MED ORDER — AMLODIPINE BESYLATE 5 MG PO TABS
5.0000 mg | ORAL_TABLET | Freq: Once | ORAL | Status: AC
Start: 2017-06-02 — End: 2017-06-02
  Administered 2017-06-02: 5 mg via ORAL
  Filled 2017-06-02: qty 1

## 2017-06-02 MED ORDER — PREDNISONE 20 MG PO TABS: 20.0000 mg | ORAL_TABLET | Freq: Two times a day (BID) | ORAL | 0 refills | Status: DC

## 2017-06-02 MED ORDER — HYDROCOD POLST-CPM POLST ER 10-8 MG/5ML PO SUER
5.0000 mL | Freq: Once | ORAL | Status: AC
  Administered 2017-06-02: 5 mL via ORAL
  Filled 2017-06-02: qty 5

## 2017-06-02 MED ORDER — ALBUTEROL SULFATE (2.5 MG/3ML) 0.083% IN NEBU
2.5000 mg | INHALATION_SOLUTION | Freq: Once | RESPIRATORY_TRACT | Status: AC
  Administered 2017-06-02: 2.5 mg via RESPIRATORY_TRACT
  Filled 2017-06-02: qty 3

## 2017-06-02 MED ORDER — BENZONATATE 100 MG PO CAPS
200.0000 mg | ORAL_CAPSULE | Freq: Once | ORAL | Status: AC
  Administered 2017-06-02: 200 mg via ORAL
  Filled 2017-06-02: qty 2

## 2017-06-02 NOTE — ED Notes (Signed)
Pt ambulated to bathroom with one person assist 

## 2017-06-02 NOTE — ED Provider Notes (Signed)
[facility] EMERGENCY DEPARTMENT Provider Note   CSN: [id number] Arrival date & time: 06/02/17  1711     History   Chief Complaint Chief Complaint  Patient presents with  . Cough    HPI Andrea Kirk is a 77 y.o. female.  Chief complaint is cough  HPI: 77 year old female.  From home.  Via EMS.  Describes a cough.  She states that her brother is a Engineer, drilling.  She developed a cough last month.  She was told that she had "the flu, and croupy".  Was treated with "medicines, I do not know which".  She continues to cough.  States she has a tickle in her throat.  Is worsened over the last 2 to 3 days.  No fever.  Does not feel short of breath.  Cannot sleep due to persistence of her cough.  Chest pain.  No dependent edema.  Nonproductive.  No hemoptysis.  Past Medical History:  Diagnosis Date  . Anxiety    takes Celexa daily  . Back pain    occasionally  . Carotid artery occlusion   . Cataract    left and immature  . Coronary artery disease   . Depression   . Diabetes mellitus    takes Metformin and Glipizide daily  . Dizziness    takes Meclizine daily as needed  . GERD (gastroesophageal reflux disease)    takes Omeprazole daily as needed  . Headache(784.0)   . Hyperlipidemia    takes Atorvastatin daily  . Hypertension    takes Carvedilol daily  . Muscle spasm    takes Robaxin daily as needed  . Nausea    takes Phenergan daily as needed  . Pneumonia    hx of-in high school  . Restless leg    takes Requip daily as needed  . Seasonal allergies    takes Claritin daily as needed and Afrin as needed  . Shortness of breath    with exertion  . Urinary urgency     Patient Active Problem List   Diagnosis Date Noted  . ARF (acute renal failure) ([place]) 11/07/2016  . UTI (urinary tract infection) 11/07/2016  . Acute kidney injury ([place]) 08/05/2016  . AKI (acute kidney injury) ([place]) 08/04/2016  . Asymptomatic stenosis of right carotid artery 03/22/2016    . Diabetes mellitus ([place]) 05/02/2015  . Carotid stenosis 11/16/2013  . Aftercare following surgery of the circulatory system, [place] 05/11/2013  . Carotid artery disease ([place]) 04/16/2013  . Coronary atherosclerosis of native coronary artery 03/11/2013  . Essential hypertension, benign 03/11/2013  . Mixed hyperlipidemia 03/11/2013  . Occlusion and stenosis of carotid artery without mention of cerebral infarction 07/19/2011    Past Surgical History:  Procedure Laterality Date  . COLONOSCOPY WITH PROPOFOL N/A 03/10/2012   Procedure: COLONOSCOPY WITH PROPOFOL;  Surgeon: Garlan Fair, MD;  Location: [facility];  Service: Endoscopy;  Laterality: N/A;  . CORNEAL TRANSPLANT Right   . CORONARY ARTERY BYPASS GRAFT  1997   x 6  . CORONARY ARTERY BYPASS GRAFT  Jan. 1997  . ENDARTERECTOMY Left 04/16/2013   Procedure: Left Carotid Artery Endatarectomy with Resection of Redundant Internal Carotid Artery;  Surgeon: Rosetta Posner, MD;  Location: [facility];  Service: Vascular;  Laterality: Left;  . ENDARTERECTOMY Right 03/22/2016   Procedure: RIGHT ENDARTERECTOMY CAROTID;  Surgeon: Rosetta Posner, MD;  Location: [facility];  Service: Vascular;  Laterality: Right;  . ESOPHAGOGASTRODUODENOSCOPY N/A 03/10/2012   Procedure: ESOPHAGOGASTRODUODENOSCOPY (EGD);  Surgeon:  Garlan Fair, MD;  Location: [facility];  Service: Endoscopy;  Laterality: N/A;  . EYE SURGERY  March 12, 2001   CORNEA TRANSPLANT Right eye  . PATCH ANGIOPLASTY Right 03/22/2016   Procedure: PATCH ANGIOPLASTY;  Surgeon: Rosetta Posner, MD;  Location: [facility];  Service: Vascular;  Laterality: Right;  . PR VEIN BYPASS GRAFT,AORTO-FEM-POP  1997  . SPINE SURGERY  march 2013   Back surgery  . TONSILLECTOMY    . TRIGGER FINGER RELEASE Left    thumb     OB History   None      Home Medications    Prior to Admission medications   Medication Sig Start Date End Date Taking? Authorizing Provider  acetaminophen (TYLENOL) 325 MG tablet Take 650 mg by  mouth every 6 (six) hours as needed for moderate pain.   Yes [provider]  amLODipine (NORVASC) 5 MG tablet Take 1 tablet (5 mg total) by mouth daily. 11/08/16  Yes Bonnielee Haff, MD  Ascorbic Acid (VITAMIN C) 1000 MG tablet Take 1,000 mg by mouth daily.   Yes [provider]  aspirin EC 81 MG tablet Take 81 mg by mouth every evening.    Yes [provider]  atorvastatin (LIPITOR) 40 MG tablet Take 40 mg by mouth daily at 6 PM.    Yes [provider]  Calcium Carbonate-Vitamin D (CALTRATE 600+D PO) Take 1 tablet by mouth daily.   Yes [provider]  carvedilol (COREG) 25 MG tablet Take 1 tablet (25 mg total) by mouth 2 (two) times daily with a meal. Please make overdue appt with Dr. Irish Lack. 1st attempt 05/14/17  Yes Jettie Booze, MD  citalopram (CELEXA) 20 MG tablet Take 20 mg by mouth every morning.    Yes [provider]  clopidogrel (PLAVIX) 75 MG tablet Take 75 mg by mouth every morning.    Yes [provider]  Cyanocobalamin (B-12) 2500 MCG TABS Take 2,500 mcg by mouth daily.   Yes [provider]  FERROUS SULFATE PO Take 325 mg by mouth daily.   Yes [provider]  fish oil-omega-3 fatty acids 1000 MG capsule Take 1 g by mouth daily.   Yes [provider]  gabapentin (NEURONTIN) 300 MG capsule Take 300 mg by mouth 4 (four) times daily.  02/28/16  Yes [provider]  glipiZIDE (GLUCOTROL XL) 10 MG 24 hr tablet Take 10 mg by mouth daily with breakfast.    Yes [provider]  HYDROcodone-acetaminophen (NORCO) 10-325 MG tablet Take 1 tablet by mouth 2 (two) times daily as needed for severe pain.  10/25/16  Yes [provider]  ibuprofen (ADVIL,MOTRIN) 400 MG tablet Take 400 mg by mouth every 6 (six) hours as needed.   Yes [provider]  Magnesium 250 MG TABS Take 250 mg by mouth daily.   Yes [provider]  meclizine (ANTIVERT) 25 MG tablet Take  25 mg by mouth 2 (two) times daily as needed for dizziness. For dizziness    Yes [provider]  metFORMIN (GLUCOPHAGE) 1000 MG tablet Take 1,000 mg by mouth 2 (two) times daily with a meal.  02/09/15  Yes [provider]  methocarbamol (ROBAXIN) 500 MG tablet Take 1 tablet (500 mg total) by mouth every 8 (eight) hours as needed for muscle spasms. 08/06/16  Yes Mikhail, Velta Addison, DO  nitroGLYCERIN (NITROSTAT) 0.4 MG SL tablet Place 1 tablet (0.4 mg total) under the tongue every 5 (five) minutes as  needed. Chest pain. Patient taking differently: Place 0.4 mg under the tongue every 5 (five) minutes as needed for chest pain.  09/28/13  Yes Jettie Booze, MD  pantoprazole (PROTONIX) 40 MG tablet Take 40 mg by mouth daily as needed (INDIGESTION).  01/10/15  Yes [provider]  phenazopyridine (PYRIDIUM) 100 MG tablet Take 200 mg by mouth 3 (three) times daily with meals.    Yes [provider]  promethazine (PHENERGAN) 12.5 MG tablet Take 12.5 mg by mouth every 6 (six) hours as needed for nausea or vomiting.   Yes [provider]  tamsulosin (FLOMAX) 0.4 MG CAPS capsule Take 0.4 mg by mouth daily.  02/24/17  Yes [provider]  albuterol (PROVENTIL HFA;VENTOLIN HFA) 108 (90 Base) MCG/ACT inhaler Inhale 1-2 puffs into the lungs every 6 (six) hours as needed for wheezing. 06/02/17   Tanna Furry, MD  azithromycin (ZITHROMAX Z-PAK) 250 MG tablet 250 mg tabs 6 tabs over 5 days as directed 06/02/17   Tanna Furry, MD  benzonatate (TESSALON) 100 MG capsule Take 1 capsule (100 mg total) by mouth every 8 (eight) hours. 06/02/17   Tanna Furry, MD  predniSONE (DELTASONE) 20 MG tablet Take 1 tablet (20 mg total) by mouth 2 (two) times daily with a meal. 06/02/17   Tanna Furry, MD    Family History Family History  Problem Relation Age of Onset  . Heart attack Father   . Heart disease Father 17       Before age of 64  . Hyperlipidemia Father   . Heart disease  Brother        Heart dissease before age 61  . Hyperlipidemia Brother   . Cirrhosis Mother   . Heart attack Paternal Grandfather   . Heart disease Paternal Grandfather   . Cancer Maternal Grandmother   . Alcoholism Maternal Grandfather   . Heart attack Paternal Grandmother     Social History Social History   Tobacco Use  . Smoking status: Never Smoker  . Smokeless tobacco: Never Used  Substance Use Topics  . Alcohol use: No    Alcohol/week: 0.0 oz  . Drug use: No     Allergies   Codeine; Latex; Morphine and related; Cyclobenzaprine; and Methocarbamol   Review of Systems Review of Systems  Constitutional: Negative for appetite change, chills, diaphoresis, fatigue and fever.  HENT: Negative for mouth sores, sore throat and trouble swallowing.   Eyes: Negative for visual disturbance.  Respiratory: Positive for cough. Negative for chest tightness, shortness of breath and wheezing.   Cardiovascular: Negative for chest pain.  Gastrointestinal: Negative for abdominal distention, abdominal pain, diarrhea, nausea and vomiting.  Endocrine: Negative for polydipsia, polyphagia and polyuria.  Genitourinary: Negative for dysuria, frequency and hematuria.  Musculoskeletal: Negative for gait problem.  Skin: Negative for color change, pallor and rash.  Neurological: Negative for dizziness, syncope, light-headedness and headaches.  Hematological: Does not bruise/bleed easily.  Psychiatric/Behavioral: Negative for behavioral problems and confusion.     Physical Exam Updated Vital Signs BP 134/83   Pulse 71   Temp 99 F (37.2 C) (Oral)   Resp 16   Ht 5\' 5"  (1.651 m)   Wt 73.5 kg (162 lb)   SpO2 95%   BMI 26.96 kg/m   Physical Exam  Constitutional: She is oriented to person, place, and time. She appears well-developed and well-nourished. No distress.  HENT:  Head: Normocephalic.  Eyes: Pupils are equal, round, and reactive to light. Conjunctivae are normal. No scleral  icterus.  Neck: Normal range of motion. Neck supple. No thyromegaly present.  Cardiovascular: Normal rate and regular rhythm. Exam reveals no gallop and no friction rub.  No murmur heard. Pulmonary/Chest: Effort normal. No respiratory distress. She has no wheezes. She has no rales.  Frequent cough.  Bronchospasm.  Wheezing.  Not prolonged.  No distress.  Abdominal: Soft. Bowel sounds are normal. She exhibits no distension. There is no tenderness. There is no rebound.  Musculoskeletal: Normal range of motion.  Neurological: She is alert and oriented to person, place, and time.  Skin: Skin is warm and dry. No rash noted.  Psychiatric: She has a normal mood and affect. Her behavior is normal.     ED Treatments / Results  Labs (all labs ordered are listed, but only abnormal results are displayed) Labs Reviewed  BASIC METABOLIC PANEL - Abnormal; Notable for the following components:      Result Value   Chloride 100 (*)    Glucose, Bld 197 (*)    All other components within normal limits  CBC WITH DIFFERENTIAL/PLATELET - Abnormal; Notable for the following components:   RBC 3.41 (*)    Hemoglobin 11.6 (*)    HCT 35.4 (*)    MCV 103.8 (*)    Platelets 146 (*)    All other components within normal limits    EKG None  Radiology Dg Chest 2 View  Result Date: 06/02/2017 CLINICAL DATA:  Cough. EXAM: CHEST - 2 VIEW COMPARISON:  04/29/2017 FINDINGS: Cardiac enlargement. Previous median sternotomy and CABG procedure identified. There are no pleural effusions or edema. No airspace opacities. Spondylosis is noted within the thoracic spine. IMPRESSION: No active cardiopulmonary disease. Electronically Signed   By: Kerby Moors M.D.   On: 06/02/2017 18:12    Procedures Procedures (including critical care time)  Medications Ordered in ED Medications  carvedilol (COREG) tablet 25 mg (25 mg Oral Given 06/02/17 1814)  amLODipine (NORVASC) tablet 5 mg (5 mg Oral Given 06/02/17 1810)   albuterol (PROVENTIL) (2.5 MG/3ML) 0.083% nebulizer solution 2.5 mg (2.5 mg Nebulization Given 06/02/17 1948)  methylPREDNISolone sodium succinate (SOLU-MEDROL) 125 mg/2 mL injection 125 mg (125 mg Intravenous Given 06/02/17 1957)  benzonatate (TESSALON) capsule 200 mg (200 mg Oral Given 06/02/17 1945)  chlorpheniramine-HYDROcodone (TUSSIONEX) 10-8 MG/5ML suspension 5 mL (5 mLs Oral Given 06/02/17 1947)     Initial Impression / Assessment and Plan / ED Course  I have reviewed the triage vital signs and the nursing notes.  Pertinent labs & imaging results that were available during my care of the patient were reviewed by me and considered in my medical decision making (see chart for details).    Chest x-ray negative.  Given nebulized albuterol.  As I come into the room and start talk with her she hands me the phone of her brother who is a physician in [place].  He cannot tell me the antibiotic she was given last month when she was presumably diagnosed with "whooping cough".  Will cover with azithromycin with the persistence of her cough.  This may indeed be aftereffects of pertussis.  She does not appear ill.  Given nebulized albuterol, prednisone, Tessalon.  Discharged home prednisone, albuterol MDI, Tessalon  Final Clinical Impressions(s) / ED Diagnoses   Final diagnoses:  Cough    ED Discharge Orders        Ordered    azithromycin (ZITHROMAX Z-PAK) 250 MG tablet     06/02/17 2117    predniSONE (DELTASONE) 20 MG tablet  2 times daily with meals     06/02/17 2117    benzonatate (TESSALON) 100 MG capsule  Every 8 hours     06/02/17 2117    albuterol (PROVENTIL HFA;VENTOLIN HFA) 108 (90 Base) MCG/ACT inhaler  Every 6 hours PRN     06/02/17 2117       Tanna Furry, MD 06/02/17 2146

## 2017-06-02 NOTE — ED Notes (Signed)
Pt alert and oriented with stable vital signs. Reviewed medications and discharge instructions. Pt verbalized understanding.

## 2017-06-02 NOTE — ED Triage Notes (Signed)
Pt presents via [facility] for evaluation of cough starting yesterday. Denies CP/SOB.

## 2017-06-02 NOTE — ED Provider Notes (Signed)
Patient placed in [facility] pathway, seen and evaluated   Chief Complaint: cough  HPI:   Patient presents for 3-day history of cough productive with clear mucus, feeling like she is dehydrated.  Patient states "I feel like I drink enough water but they always tell me him dehydrated."  Denies vomiting or abdominal pain. Denies any chest pain, shortness of breath, hemoptysis. Patient is a poor historian. Reports compliance with her home medications, but did not take her amlodipine or carvedilol this morning. Is scheduled to meet with her pain management provider tomorrow morning.  ROS: cough  Physical Exam:   Gen: No distress  Neuro: Awake and Alert  Skin: Warm    Focused Exam: RRR, lungs CTAB  Will order 1 dose of amlodipine 5 mg and carvedilol 25 mg as patient is hypertensive here.  Initiation of care has begun. The patient has been counseled on the process, plan, and necessity for staying for the completion/evaluation, and the remainder of the medical screening examination   Delia Heady, PA-C 06/02/17 1805    Tanna Furry, MD 06/06/17 [phone]

## 2017-06-02 NOTE — ED Notes (Signed)
Pt ambulated in the hallway and maintained a 94% SP02

## 2017-06-02 NOTE — Discharge Instructions (Addendum)
Albuterol as needed for cough or wheezing. Tessalon-cough suppressant. Prednisone steroid, over 5 days as directed. Zithromax antibiotic.

## 2017-07-14 ENCOUNTER — Other Ambulatory Visit: Payer: Self-pay | Admitting: Otolaryngology

## 2017-07-14 DIAGNOSIS — D333 Benign neoplasm of cranial nerves: Secondary | ICD-10-CM

## 2017-07-23 ENCOUNTER — Other Ambulatory Visit: Payer: Medicare Other

## 2017-08-05 ENCOUNTER — Ambulatory Visit
Admission: RE | Admit: 2017-08-05 | Discharge: 2017-08-05 | Disposition: A | Discharge status: Home / Self Care | Payer: Medicare Other | Source: Ambulatory Visit | Attending: Otolaryngology | Admitting: Otolaryngology

## 2017-08-05 DIAGNOSIS — D333 Benign neoplasm of cranial nerves: Secondary | ICD-10-CM

## 2017-08-05 MED ORDER — GADOBENATE DIMEGLUMINE 529 MG/ML IV SOLN
16.0000 mL | Freq: Once | INTRAVENOUS | Status: AC | PRN
  Administered 2017-08-05: 16 mL via INTRAVENOUS

## 2017-09-02 ENCOUNTER — Ambulatory Visit: Payer: Medicare Other | Admitting: Interventional Cardiology

## 2017-09-10 ENCOUNTER — Other Ambulatory Visit: Payer: Self-pay | Admitting: Interventional Cardiology

## 2017-10-29 ENCOUNTER — Other Ambulatory Visit: Payer: Self-pay | Admitting: Interventional Cardiology

## 2017-10-29 NOTE — Telephone Encounter (Signed)
Outpatient Medication Detail    Disp Refills Start End   carvedilol (COREG) 25 MG tablet 180 tablet 0 09/10/2017    Sig - Route: Take 1 tablet (25 mg total) by mouth 2 (two) times daily. - Oral   Sent to pharmacy as: carvedilol (COREG) 25 MG tablet   Notes to Pharmacy: Please keep upcoming appointment with Dr. Irish Lack in December for further refills.   E-Prescribing Status: Receipt confirmed by pharmacy (09/10/2017 4:38 PM EDT)   Pharmacy   [facility], [place]

## 2017-12-05 NOTE — Progress Notes (Signed)
Cardiology Office Note   Date:  12/08/2017   ID:  Andrea Kirk, DOB 09-11-1940, MRN [id number]  PCP:  Aura Dials, MD    No chief complaint on file.  CAD  Wt Readings from Last 3 Encounters:  12/08/17 159 lb 12.8 oz (72.5 kg)  06/02/17 162 lb (73.5 kg)  05/13/17 162 lb (73.5 kg)       History of Present Illness: Andrea Kirk is a 77 y.o. female  who had CABG in 1997.  She suffered a fall in 2014 due to her knee giving way. She did not pass out at that time. She had significant bruising on her face at that time.    She works at [employer] on [address].  She works only between 7A-7P for 4-5 hours at a time due to leg problems.    She had a right CEA a few months ago in 2018.  She had several falls after the surgery.  She has broken her right wrist.    She subsequently broke her collar bone but did not require surgery.   In 10/19, she had a UTI.  She then was on an antibioticand had leg swelling.  She was started on Lasix. Her weight was up 14 lbs at that time.  It came down with diuresis.    She moved to a senior community in 11/19, to [place].  She had some swelling before the move as well.  It resolved with Lasix.    Denies : Chest pain. Dizziness. Nitroglycerin use. Orthopnea. Palpitations. Paroxysmal nocturnal dyspnea. Shortness of breath. Syncope.     Past Medical History:  Diagnosis Date  . Acute kidney injury ([place]) 08/05/2016  . Aftercare following surgery of the circulatory system, [place] 05/11/2013  . AKI (acute kidney injury) ([place]) 08/04/2016  . Anxiety    takes Celexa daily  . ARF (acute renal failure) ([place]) 11/07/2016  . Asymptomatic stenosis of right carotid artery 03/22/2016  . Back pain    occasionally  . Carotid artery disease ([place]) 04/16/2013  . Carotid artery occlusion   . Carotid stenosis 11/16/2013  . Cataract    left and immature  . Coronary artery disease   . Coronary atherosclerosis of native coronary artery 03/11/2013   S/p CABG in  1997   . Depression   . Diabetes mellitus    takes Metformin and Glipizide daily  . Diabetes mellitus ([place]) 05/02/2015  . Dizziness    takes Meclizine daily as needed  . Essential hypertension, benign 03/11/2013  . GERD (gastroesophageal reflux disease)    takes Omeprazole daily as needed  . Headache(784.0)   . Hyperlipidemia    takes Atorvastatin daily  . Hypertension    takes Carvedilol daily  . Mixed hyperlipidemia 03/11/2013  . Muscle spasm    takes Robaxin daily as needed  . Nausea    takes Phenergan daily as needed  . Occlusion and stenosis of carotid artery without mention of cerebral infarction 07/19/2011  . Pneumonia    hx of-in high school  . Restless leg    takes Requip daily as needed  . Seasonal allergies    takes Claritin daily as needed and Afrin as needed  . Shortness of breath    with exertion  . Urinary urgency   . UTI (urinary tract infection) 11/07/2016    Past Surgical History:  Procedure Laterality Date  . COLONOSCOPY WITH PROPOFOL N/A 03/10/2012   Procedure: COLONOSCOPY WITH PROPOFOL;  Surgeon: Garlan Fair,  MD;  Location: [facility];  Service: Endoscopy;  Laterality: N/A;  . CORNEAL TRANSPLANT Right   . CORONARY ARTERY BYPASS GRAFT  1997   x 6  . CORONARY ARTERY BYPASS GRAFT  Jan. 1997  . ENDARTERECTOMY Left 04/16/2013   Procedure: Left Carotid Artery Endatarectomy with Resection of Redundant Internal Carotid Artery;  Surgeon: Rosetta Posner, MD;  Location: [facility];  Service: Vascular;  Laterality: Left;  . ENDARTERECTOMY Right 03/22/2016   Procedure: RIGHT ENDARTERECTOMY CAROTID;  Surgeon: Rosetta Posner, MD;  Location: [facility] OR;  Service: Vascular;  Laterality: Right;  . ESOPHAGOGASTRODUODENOSCOPY N/A 03/10/2012   Procedure: ESOPHAGOGASTRODUODENOSCOPY (EGD);  Surgeon: Garlan Fair, MD;  Location: [facility];  Service: Endoscopy;  Laterality: N/A;  . EYE SURGERY  March 12, 2001   CORNEA TRANSPLANT Right eye  . PATCH ANGIOPLASTY Right 03/22/2016    Procedure: PATCH ANGIOPLASTY;  Surgeon: Rosetta Posner, MD;  Location: [facility];  Service: Vascular;  Laterality: Right;  . PR VEIN BYPASS GRAFT,AORTO-FEM-POP  1997  . SPINE SURGERY  march 2013   Back surgery  . TONSILLECTOMY    . TRIGGER FINGER RELEASE Left    thumb     Current Outpatient Medications  Medication Sig Dispense Refill  . acetaminophen (TYLENOL) 325 MG tablet Take 650 mg by mouth every 6 (six) hours as needed for moderate pain.    Marland Kitchen amLODipine (NORVASC) 5 MG tablet Take 1 tablet (5 mg total) by mouth daily. 30 tablet 0  . Ascorbic Acid (VITAMIN C) 1000 MG tablet Take 1,000 mg by mouth daily.    Marland Kitchen aspirin EC 81 MG tablet Take 81 mg by mouth every evening.     Marland Kitchen atorvastatin (LIPITOR) 40 MG tablet Take 40 mg by mouth daily at 6 PM.     . Calcium Carbonate-Vitamin D (CALTRATE 600+D PO) Take 1 tablet by mouth daily.    . carvedilol (COREG) 25 MG tablet Take 1 tablet (25 mg total) by mouth 2 (two) times daily. 180 tablet 0  . citalopram (CELEXA) 20 MG tablet Take 20 mg by mouth every morning.     . clopidogrel (PLAVIX) 75 MG tablet Take 75 mg by mouth every morning.     . Cyanocobalamin (B-12) 2500 MCG TABS Take 2,500 mcg by mouth daily.    Marland Kitchen FERROUS SULFATE PO Take 325 mg by mouth daily.    . fish oil-omega-3 fatty acids 1000 MG capsule Take 1 g by mouth daily.    . furosemide (LASIX) 20 MG tablet     . gabapentin (NEURONTIN) 300 MG capsule Take 300 mg by mouth 4 (four) times daily.     Marland Kitchen glipiZIDE (GLUCOTROL XL) 10 MG 24 hr tablet Take 10 mg by mouth daily with breakfast.     . HYDROcodone-acetaminophen (NORCO) 10-325 MG tablet Take 1 tablet by mouth 2 (two) times daily as needed for severe pain.     Marland Kitchen ibuprofen (ADVIL,MOTRIN) 400 MG tablet Take 400 mg by mouth every 6 (six) hours as needed.    . Magnesium 250 MG TABS Take 250 mg by mouth daily.    . meclizine (ANTIVERT) 25 MG tablet Take 25 mg by mouth 2 (two) times daily as needed for dizziness. For dizziness     . metFORMIN  (GLUCOPHAGE) 1000 MG tablet Take 1,000 mg by mouth 2 (two) times daily with a meal.     . methocarbamol (ROBAXIN) 500 MG tablet Take 1 tablet (500 mg total) by mouth every 8 (eight)  hours as needed for muscle spasms. 30 tablet 0  . nitroGLYCERIN (NITROSTAT) 0.4 MG SL tablet Place 1 tablet (0.4 mg total) under the tongue every 5 (five) minutes as needed. Chest pain. (Patient taking differently: Place 0.4 mg under the tongue every 5 (five) minutes as needed for chest pain. ) 25 tablet 5  . pantoprazole (PROTONIX) 40 MG tablet Take 40 mg by mouth daily as needed (INDIGESTION).     Marland Kitchen predniSONE (DELTASONE) 20 MG tablet Take 1 tablet (20 mg total) by mouth 2 (two) times daily with a meal. 10 tablet 0  . promethazine (PHENERGAN) 12.5 MG tablet Take 12.5 mg by mouth every 6 (six) hours as needed for nausea or vomiting.    . tamsulosin (FLOMAX) 0.4 MG CAPS capsule Take 0.4 mg by mouth daily.      No current facility-administered medications for this visit.     Allergies:   Codeine; Latex; Morphine and related; Cyclobenzaprine; and Methocarbamol    Social History:  The patient  reports that she has never smoked. She has never used smokeless tobacco. She reports that she does not drink alcohol or use drugs.   Family History:  The patient's family history includes Alcoholism in her maternal grandfather; Cancer in her maternal grandmother; Cirrhosis in her mother; Heart attack in her father, paternal grandfather, and paternal grandmother; Heart disease in her brother and paternal grandfather; Heart disease (age of onset: 73) in her father; Hyperlipidemia in her brother and father.    ROS:  Please see the history of present illness.   Otherwise, review of systems are positive for intermittent swelling.   All other systems are reviewed and negative.    PHYSICAL EXAM: VS:  BP (!) 144/78   Pulse 68   Ht 5\' 5"  (1.651 m)   Wt 159 lb 12.8 oz (72.5 kg)   SpO2 98%   BMI 26.59 kg/m  , BMI Body mass index is  26.59 kg/m. GEN: Well nourished, well developed, in no acute distress  HEENT: normal  Neck: no JVD, carotid bruits, or masses Cardiac: RRR; no murmurs, rubs, or gallops,no edema  Respiratory:  clear to auscultation bilaterally, normal work of breathing GI: soft, nontender, nondistended, + BS MS: no deformity or atrophy ; scar on left leg Skin: warm and dry, no rash Neuro:  Strength and sensation are intact Psych: euthymic mood, full affect   EKG:   The ekg ordered today demonstrates NSR, no ST changes   Recent Labs: 06/02/2017: BUN 19; Creatinine, Ser 0.72; Hemoglobin 11.6; Platelets 146; Potassium 4.1; Sodium 140   Lipid Panel No results found for: CHOL, TRIG, HDL, CHOLHDL, VLDL, LDLCALC, LDLDIRECT   Other studies Reviewed: Additional studies/ records that were reviewed today with results demonstrating: EF 60-65% in 2018.  No signficant ICA disease bilaterally in 2019.   ASSESSMENT AND PLAN:  1. CAD: No angina.  Continue aggressive secondary prevention.  Recent volume overload.  Suspicious for diastolic heart failure, chronic.  Will check echocardiogram. 2. HTN: She will get a cuff to check blood pressure at home.  We also talked about getting her a scale given her issues with volume overload. 3. Hyperlipidemia: Continue atorvastatin.  LDL 89 in 2017.  To be rechecked by Dr. Sheryn Bison.  She has blood work coming up with him. 4. DM: A1C 6.1 in 8/19. Reglar exercise is important.    5. Carotid artery disease: s/p CEA.     Current medicines are reviewed at length with the patient today.  The  patient concerns regarding her medicines were addressed.  The following changes have been made:  No change  Labs/ tests ordered today include:  No orders of the defined types were placed in this encounter.   Recommend 150 minutes/week of aerobic exercise Low fat, low carb, high fiber diet recommended  Disposition:   FU in 1 year   Signed, Larae Grooms, MD  12/08/2017 1:25 PM      [facility] [address], [place], [place]  [address] Phone: [phone]; Fax: [phone]

## 2017-12-08 ENCOUNTER — Encounter

## 2017-12-08 ENCOUNTER — Ambulatory Visit (INDEPENDENT_AMBULATORY_CARE_PROVIDER_SITE_OTHER): Payer: Medicare Other | Admitting: Interventional Cardiology

## 2017-12-08 ENCOUNTER — Encounter: Payer: Self-pay | Admitting: Interventional Cardiology

## 2017-12-08 VITALS — BP 144/78 | HR 68 | Ht 65.0 in | Wt 159.8 lb

## 2017-12-08 DIAGNOSIS — E782 Mixed hyperlipidemia: Secondary | ICD-10-CM

## 2017-12-08 DIAGNOSIS — I25119 Atherosclerotic heart disease of native coronary artery with unspecified angina pectoris: Secondary | ICD-10-CM

## 2017-12-08 DIAGNOSIS — I10 Essential (primary) hypertension: Secondary | ICD-10-CM

## 2017-12-08 DIAGNOSIS — I739 Peripheral vascular disease, unspecified: Secondary | ICD-10-CM

## 2017-12-08 DIAGNOSIS — I779 Disorder of arteries and arterioles, unspecified: Secondary | ICD-10-CM

## 2017-12-08 DIAGNOSIS — E1159 Type 2 diabetes mellitus with other circulatory complications: Secondary | ICD-10-CM

## 2017-12-08 DIAGNOSIS — I5032 Chronic diastolic (congestive) heart failure: Secondary | ICD-10-CM

## 2017-12-08 NOTE — Patient Instructions (Signed)
Medication Instructions:  Your physician recommends that you continue on your current medications as directed. Please refer to the Current Medication list given to you today.  If you need a refill on your cardiac medications before your next appointment, please call your pharmacy.   Lab work: None Ordered  If you have labs (blood work) drawn today and your tests are completely normal, you will receive your results only by: . MyChart Message (if you have MyChart) OR . A paper copy in the mail If you have any lab test that is abnormal or we need to change your treatment, we will call you to review the results.  Testing/Procedures: Your physician has requested that you have an echocardiogram. Echocardiography is a painless test that uses sound waves to create images of your heart. It provides your doctor with information about the size and shape of your heart and how well your heart's chambers and valves are working. This procedure takes approximately one hour. There are no restrictions for this procedure.  Follow-Up: At [facility], you and your health needs are our priority.  As part of our continuing mission to provide you with exceptional heart care, we have created designated Provider Care Teams.  These Care Teams include your primary Cardiologist (physician) and Advanced Practice Providers (APPs -  Physician Assistants and Nurse Practitioners) who all work together to provide you with the care you need, when you need it. . You will need a follow up appointment in 1 year.  Please call our office 2 months in advance to schedule this appointment.  You may see Jay Varanasi, MD or one of the following Advanced Practice Providers on your designated Care Team:   . Brittainy Simmons, PA-C . Dayna Dunn, PA-C . Michele Lenze, PA-C  Any Other Special Instructions Will Be Listed Below (If Applicable).  Echocardiogram An echocardiogram, or echocardiography, uses sound waves (ultrasound) to produce  an image of your heart. The echocardiogram is simple, painless, obtained within a short period of time, and offers valuable information to your health care provider. The images from an echocardiogram can provide information such as:  Evidence of coronary artery disease (CAD).  Heart size.  Heart muscle function.  Heart valve function.  Aneurysm detection.  Evidence of a past heart attack.  Fluid buildup around the heart.  Heart muscle thickening.  Assess heart valve function.  Tell a health care provider about:  Any allergies you have.  All medicines you are taking, including vitamins, herbs, eye drops, creams, and over-the-counter medicines.  Any problems you or family members have had with anesthetic medicines.  Any blood disorders you have.  Any surgeries you have had.  Any medical conditions you have.  Whether you are pregnant or may be pregnant. What happens before the procedure? No special preparation is needed. Eat and drink normally. What happens during the procedure?  In order to produce an image of your heart, gel will be applied to your chest and a wand-like tool (transducer) will be moved over your chest. The gel will help transmit the sound waves from the transducer. The sound waves will harmlessly bounce off your heart to allow the heart images to be captured in real-time motion. These images will then be recorded.  You may need an IV to receive a medicine that improves the quality of the pictures. What happens after the procedure? You may return to your normal schedule including diet, activities, and medicines, unless your health care provider tells you otherwise. This information   is not intended to replace advice given to you by your health care provider. Make sure you discuss any questions you have with your health care provider. Document Released: 12/22/1999 Document Revised: 08/12/2015 Document Reviewed: 08/31/2012 Elsevier Interactive Patient  Education  2017 [employer]   

## 2017-12-16 ENCOUNTER — Other Ambulatory Visit: Payer: Self-pay

## 2017-12-16 ENCOUNTER — Telehealth: Payer: Self-pay

## 2017-12-16 ENCOUNTER — Ambulatory Visit (HOSPITAL_COMMUNITY): Payer: Medicare Other | Attending: Internal Medicine

## 2017-12-16 DIAGNOSIS — I25119 Atherosclerotic heart disease of native coronary artery with unspecified angina pectoris: Secondary | ICD-10-CM

## 2017-12-16 NOTE — Telephone Encounter (Signed)
-----   Message from Jettie Booze, MD sent at 12/16/2017  5:14 PM EST ----- Normal LVEF.  Age related heart stiffening noted which would cause fluid retention.  Manage with low salt diet and diuretics.

## 2017-12-16 NOTE — Telephone Encounter (Signed)
Left message for patient to call back  

## 2017-12-17 NOTE — Telephone Encounter (Signed)
The patient has been notified of the result and verbalized understanding.  All questions (if any) were answered.     

## 2017-12-19 ENCOUNTER — Other Ambulatory Visit: Payer: Self-pay | Admitting: Interventional Cardiology

## 2017-12-21 ENCOUNTER — Emergency Department (HOSPITAL_COMMUNITY): Payer: Medicare Other

## 2017-12-21 ENCOUNTER — Other Ambulatory Visit: Payer: Self-pay

## 2017-12-21 ENCOUNTER — Observation Stay (HOSPITAL_COMMUNITY)
Admission: EM | Admit: 2017-12-21 | Discharge: 2017-12-22 | Disposition: A | Discharge status: Home / Self Care | Payer: Medicare Other | Attending: Internal Medicine | Admitting: Internal Medicine

## 2017-12-21 ENCOUNTER — Encounter (HOSPITAL_COMMUNITY): Payer: Self-pay

## 2017-12-21 DIAGNOSIS — Y92002 Bathroom of unspecified non-institutional (private) residence single-family (private) house as the place of occurrence of the external cause: Secondary | ICD-10-CM | POA: Insufficient documentation

## 2017-12-21 DIAGNOSIS — M25562 Pain in left knee: Secondary | ICD-10-CM | POA: Insufficient documentation

## 2017-12-21 DIAGNOSIS — R0902 Hypoxemia: Secondary | ICD-10-CM | POA: Diagnosis not present

## 2017-12-21 DIAGNOSIS — G2581 Restless legs syndrome: Secondary | ICD-10-CM | POA: Insufficient documentation

## 2017-12-21 DIAGNOSIS — Z7982 Long term (current) use of aspirin: Secondary | ICD-10-CM | POA: Insufficient documentation

## 2017-12-21 DIAGNOSIS — G8929 Other chronic pain: Secondary | ICD-10-CM

## 2017-12-21 DIAGNOSIS — E785 Hyperlipidemia, unspecified: Secondary | ICD-10-CM

## 2017-12-21 DIAGNOSIS — Z7984 Long term (current) use of oral hypoglycemic drugs: Secondary | ICD-10-CM | POA: Diagnosis not present

## 2017-12-21 DIAGNOSIS — Z885 Allergy status to narcotic agent status: Secondary | ICD-10-CM | POA: Insufficient documentation

## 2017-12-21 DIAGNOSIS — Z7902 Long term (current) use of antithrombotics/antiplatelets: Secondary | ICD-10-CM | POA: Diagnosis not present

## 2017-12-21 DIAGNOSIS — E119 Type 2 diabetes mellitus without complications: Secondary | ICD-10-CM | POA: Diagnosis not present

## 2017-12-21 DIAGNOSIS — I700 Atherosclerosis of aorta: Secondary | ICD-10-CM | POA: Insufficient documentation

## 2017-12-21 DIAGNOSIS — D3610 Benign neoplasm of peripheral nerves and autonomic nervous system, unspecified: Secondary | ICD-10-CM | POA: Insufficient documentation

## 2017-12-21 DIAGNOSIS — I6521 Occlusion and stenosis of right carotid artery: Secondary | ICD-10-CM | POA: Insufficient documentation

## 2017-12-21 DIAGNOSIS — Z951 Presence of aortocoronary bypass graft: Secondary | ICD-10-CM | POA: Insufficient documentation

## 2017-12-21 DIAGNOSIS — F32A Depression, unspecified: Secondary | ICD-10-CM

## 2017-12-21 DIAGNOSIS — E782 Mixed hyperlipidemia: Secondary | ICD-10-CM | POA: Insufficient documentation

## 2017-12-21 DIAGNOSIS — F329 Major depressive disorder, single episode, unspecified: Secondary | ICD-10-CM | POA: Insufficient documentation

## 2017-12-21 DIAGNOSIS — Z9181 History of falling: Secondary | ICD-10-CM | POA: Insufficient documentation

## 2017-12-21 DIAGNOSIS — K219 Gastro-esophageal reflux disease without esophagitis: Secondary | ICD-10-CM | POA: Diagnosis not present

## 2017-12-21 DIAGNOSIS — I5032 Chronic diastolic (congestive) heart failure: Secondary | ICD-10-CM

## 2017-12-21 DIAGNOSIS — W19XXXA Unspecified fall, initial encounter: Secondary | ICD-10-CM

## 2017-12-21 DIAGNOSIS — E669 Obesity, unspecified: Secondary | ICD-10-CM | POA: Diagnosis not present

## 2017-12-21 DIAGNOSIS — I2510 Atherosclerotic heart disease of native coronary artery without angina pectoris: Secondary | ICD-10-CM | POA: Diagnosis not present

## 2017-12-21 DIAGNOSIS — Z8249 Family history of ischemic heart disease and other diseases of the circulatory system: Secondary | ICD-10-CM | POA: Insufficient documentation

## 2017-12-21 DIAGNOSIS — J9811 Atelectasis: Secondary | ICD-10-CM | POA: Insufficient documentation

## 2017-12-21 DIAGNOSIS — R1010 Upper abdominal pain, unspecified: Secondary | ICD-10-CM | POA: Diagnosis not present

## 2017-12-21 DIAGNOSIS — Z66 Do not resuscitate: Secondary | ICD-10-CM | POA: Insufficient documentation

## 2017-12-21 DIAGNOSIS — J9601 Acute respiratory failure with hypoxia: Secondary | ICD-10-CM

## 2017-12-21 DIAGNOSIS — W1830XA Fall on same level, unspecified, initial encounter: Secondary | ICD-10-CM | POA: Diagnosis not present

## 2017-12-21 DIAGNOSIS — D333 Benign neoplasm of cranial nerves: Secondary | ICD-10-CM

## 2017-12-21 DIAGNOSIS — I110 Hypertensive heart disease with heart failure: Secondary | ICD-10-CM | POA: Insufficient documentation

## 2017-12-21 DIAGNOSIS — F419 Anxiety disorder, unspecified: Secondary | ICD-10-CM | POA: Diagnosis not present

## 2017-12-21 DIAGNOSIS — Z6831 Body mass index (BMI) 31.0-31.9, adult: Secondary | ICD-10-CM | POA: Diagnosis not present

## 2017-12-21 DIAGNOSIS — R296 Repeated falls: Secondary | ICD-10-CM

## 2017-12-21 DIAGNOSIS — Z7952 Long term (current) use of systemic steroids: Secondary | ICD-10-CM | POA: Insufficient documentation

## 2017-12-21 DIAGNOSIS — I10 Essential (primary) hypertension: Secondary | ICD-10-CM | POA: Diagnosis present

## 2017-12-21 DIAGNOSIS — Z791 Long term (current) use of non-steroidal anti-inflammatories (NSAID): Secondary | ICD-10-CM | POA: Insufficient documentation

## 2017-12-21 LAB — COMPREHENSIVE METABOLIC PANEL
ALT: 17 U/L (ref 0–44)
AST: 35 U/L (ref 15–41)
Albumin: 4.2 g/dL (ref 3.5–5.0)
Alkaline Phosphatase: 64 U/L (ref 38–126)
Anion gap: 11 (ref 5–15)
BUN: 36 mg/dL — ABNORMAL HIGH (ref 8–23)
CO2: 27 mmol/L (ref 22–32)
Calcium: 9.5 mg/dL (ref 8.9–10.3)
Chloride: 98 mmol/L (ref 98–111)
Creatinine, Ser: 1.1 mg/dL — ABNORMAL HIGH (ref 0.44–1.00)
GFR calc Af Amer: 56 mL/min — ABNORMAL LOW (ref >60)
GFR calc non Af Amer: 49 mL/min — ABNORMAL LOW (ref >60)
Glucose, Bld: 150 mg/dL — ABNORMAL HIGH (ref 70–99)
Potassium: 4.9 mmol/L (ref 3.5–5.1)
Sodium: 136 mmol/L (ref 135–145)
Total Bilirubin: 1.1 mg/dL (ref 0.3–1.2)
Total Protein: 7.6 g/dL (ref 6.5–8.1)

## 2017-12-21 LAB — CBC WITH DIFFERENTIAL/PLATELET
Abs Immature Granulocytes: 0.01 10*3/uL (ref 0.00–0.07)
Basophils Absolute: 0 10*3/uL (ref 0.0–0.1)
Basophils Relative: 1 %
Eosinophils Absolute: 0.1 10*3/uL (ref 0.0–0.5)
Eosinophils Relative: 1 %
HCT: 39 % (ref 36.0–46.0)
Hemoglobin: 12.3 g/dL (ref 12.0–15.0)
Immature Granulocytes: 0 %
Lymphocytes Relative: 29 %
Lymphs Abs: 2.2 10*3/uL (ref 0.7–4.0)
MCH: 33.9 pg (ref 26.0–34.0)
MCHC: 31.5 g/dL (ref 30.0–36.0)
MCV: 107.4 fL — ABNORMAL HIGH (ref 80.0–100.0)
Monocytes Absolute: 0.7 10*3/uL (ref 0.1–1.0)
Monocytes Relative: 9 %
Neutro Abs: 4.6 10*3/uL (ref 1.7–7.7)
Neutrophils Relative %: 60 %
Platelets: 174 10*3/uL (ref 150–400)
RBC: 3.63 MIL/uL — ABNORMAL LOW (ref 3.87–5.11)
RDW: 12.9 % (ref 11.5–15.5)
WBC: 7.7 10*3/uL (ref 4.0–10.5)
nRBC: 0 % (ref 0.0–0.2)

## 2017-12-21 LAB — URINALYSIS, ROUTINE W REFLEX MICROSCOPIC
Bilirubin Urine: NEGATIVE
Glucose, UA: NEGATIVE mg/dL
Hgb urine dipstick: NEGATIVE
Ketones, ur: NEGATIVE mg/dL
Leukocytes, UA: NEGATIVE
Nitrite: NEGATIVE
Protein, ur: NEGATIVE mg/dL
Specific Gravity, Urine: 1.006 (ref 1.005–1.030)
pH: 5 (ref 5.0–8.0)

## 2017-12-21 LAB — D-DIMER, QUANTITATIVE: D-Dimer, Quant: 0.94 ug/mL-FEU — ABNORMAL HIGH (ref 0.00–0.50)

## 2017-12-21 LAB — I-STAT CHEM 8, ED
BUN: 45 mg/dL — ABNORMAL HIGH (ref 8–23)
Calcium, Ion: 1.2 mmol/L (ref 1.15–1.40)
Chloride: 99 mmol/L (ref 98–111)
Creatinine, Ser: 1.1 mg/dL — ABNORMAL HIGH (ref 0.44–1.00)
Glucose, Bld: 142 mg/dL — ABNORMAL HIGH (ref 70–99)
HCT: 40 % (ref 36.0–46.0)
Hemoglobin: 13.6 g/dL (ref 12.0–15.0)
Potassium: 4.9 mmol/L (ref 3.5–5.1)
Sodium: 136 mmol/L (ref 135–145)
TCO2: 32 mmol/L (ref 22–32)

## 2017-12-21 LAB — I-STAT TROPONIN, ED: Troponin i, poc: 0.01 ng/mL (ref 0.00–0.08)

## 2017-12-21 LAB — LIPASE, BLOOD: Lipase: 51 U/L (ref 11–51)

## 2017-12-21 MED ORDER — IOPAMIDOL (ISOVUE-370) INJECTION 76%
100.0000 mL | Freq: Once | INTRAVENOUS | Status: AC | PRN
  Administered 2017-12-21: 100 mL via INTRAVENOUS

## 2017-12-21 MED ORDER — SODIUM CHLORIDE (PF) 0.9 % IJ SOLN
INTRAMUSCULAR | Status: AC
  Filled 2017-12-21: qty 50

## 2017-12-21 MED ORDER — IOPAMIDOL (ISOVUE-370) INJECTION 76%
INTRAVENOUS | Status: AC
  Filled 2017-12-21: qty 100

## 2017-12-21 NOTE — ED Notes (Signed)
Went into pts room due to monitor going off. Pt O2 was at 74%. Told pt to take deep breaths and got it up to 84%. Good wave form. Placed back on 3 L [place] and O2 returned to 96 in a couple minutes.

## 2017-12-21 NOTE — ED Notes (Signed)
Bed: GA89 Expected date:  Expected time:  Means of arrival:  Comments: [place] on Thursday

## 2017-12-21 NOTE — ED Provider Notes (Signed)
[facility] DEPT Provider Note   CSN: [id number] Arrival date & time: 12/21/17  1759     History   Chief Complaint Chief Complaint  Patient presents with  . Fall    HPI Andrea Kirk is a 77 y.o. female.  The history is provided by the patient, the EMS personnel and medical records. No language interpreter was used.  Fall    Andrea Kirk is a 77 y.o. female who presents to the Emergency Department complaining of fall. She presents to the emergency department by EMS for evaluation of fall times two. She currently is a resident at [facility] independent [facility]. On Thursday she had a fall when she was getting the mail and fell forward, striking her head. Since that time she has pain to her face into her abdomen and chest. Today she had a second fall when she was in the bathroom. She is unable to state why she had the falls. She denies any loss of consciousness. Today's fall in the bathroom she fell backwards, striking her head. She denies any shortness of breath. She does state that she has chronic bilateral lower extremity edema, unclear how this is changed compared to prior. Denies any fevers. She does report pain all over, particularly in her left knee, abdomen and chest.  Past Medical History:  Diagnosis Date  . Acute kidney injury ([place]) 08/05/2016  . Aftercare following surgery of the circulatory system, [place] 05/11/2013  . AKI (acute kidney injury) ([place]) 08/04/2016  . Anxiety    takes Celexa daily  . ARF (acute renal failure) ([place]) 11/07/2016  . Asymptomatic stenosis of right carotid artery 03/22/2016  . Back pain    occasionally  . Carotid artery disease ([place]) 04/16/2013  . Carotid artery occlusion   . Carotid stenosis 11/16/2013  . Cataract    left and immature  . Coronary artery disease   . Coronary atherosclerosis of native coronary artery 03/11/2013   S/p CABG in 1997   . Depression   . Diabetes mellitus    takes Metformin and Glipizide daily    . Diabetes mellitus ([place]) 05/02/2015  . Dizziness    takes Meclizine daily as needed  . Essential hypertension, benign 03/11/2013  . GERD (gastroesophageal reflux disease)    takes Omeprazole daily as needed  . Headache(784.0)   . Hyperlipidemia    takes Atorvastatin daily  . Hypertension    takes Carvedilol daily  . Mixed hyperlipidemia 03/11/2013  . Muscle spasm    takes Robaxin daily as needed  . Nausea    takes Phenergan daily as needed  . Occlusion and stenosis of carotid artery without mention of cerebral infarction 07/19/2011  . Pneumonia    hx of-in high school  . Restless leg    takes Requip daily as needed  . Seasonal allergies    takes Claritin daily as needed and Afrin as needed  . Shortness of breath    with exertion  . Urinary urgency   . UTI (urinary tract infection) 11/07/2016    Patient Active Problem List   Diagnosis Date Noted  . Hypoxia 12/21/2017  . ARF (acute renal failure) ([place]) 11/07/2016  . UTI (urinary tract infection) 11/07/2016  . Acute kidney injury ([place]) 08/05/2016  . AKI (acute kidney injury) ([place]) 08/04/2016  . Asymptomatic stenosis of right carotid artery 03/22/2016  . Diabetes mellitus ([place]) 05/02/2015  . Carotid stenosis 11/16/2013  . Aftercare following surgery of the circulatory system, [place] 05/11/2013  . Carotid  artery disease ([place]) 04/16/2013  . Coronary atherosclerosis of native coronary artery 03/11/2013  . Essential hypertension, benign 03/11/2013  . Hyperlipidemia 03/11/2013  . Occlusion and stenosis of carotid artery without mention of cerebral infarction 07/19/2011    Past Surgical History:  Procedure Laterality Date  . COLONOSCOPY WITH PROPOFOL N/A 03/10/2012   Procedure: COLONOSCOPY WITH PROPOFOL;  Surgeon: Garlan Fair, MD;  Location: [facility]COPY;  Service: Endoscopy;  Laterality: N/A;  . CORNEAL TRANSPLANT Right   . CORONARY ARTERY BYPASS GRAFT  1997   x 6  . CORONARY ARTERY BYPASS GRAFT  Jan. 1997  . ENDARTERECTOMY  Left 04/16/2013   Procedure: Left Carotid Artery Endatarectomy with Resection of Redundant Internal Carotid Artery;  Surgeon: Rosetta Posner, MD;  Location: [facility];  Service: Vascular;  Laterality: Left;  . ENDARTERECTOMY Right 03/22/2016   Procedure: RIGHT ENDARTERECTOMY CAROTID;  Surgeon: Rosetta Posner, MD;  Location: [facility];  Service: Vascular;  Laterality: Right;  . ESOPHAGOGASTRODUODENOSCOPY N/A 03/10/2012   Procedure: ESOPHAGOGASTRODUODENOSCOPY (EGD);  Surgeon: Garlan Fair, MD;  Location: [facility];  Service: Endoscopy;  Laterality: N/A;  . EYE SURGERY  March 12, 2001   CORNEA TRANSPLANT Right eye  . PATCH ANGIOPLASTY Right 03/22/2016   Procedure: PATCH ANGIOPLASTY;  Surgeon: Rosetta Posner, MD;  Location: [facility];  Service: Vascular;  Laterality: Right;  . PR VEIN BYPASS GRAFT,AORTO-FEM-POP  1997  . SPINE SURGERY  march 2013   Back surgery  . TONSILLECTOMY    . TRIGGER FINGER RELEASE Left    thumb     OB History   No obstetric history on file.      Home Medications    Prior to Admission medications   Medication Sig Start Date End Date Taking? Authorizing Provider  acetaminophen (TYLENOL) 325 MG tablet Take 650 mg by mouth every 6 (six) hours as needed for moderate pain.    [provider]  amLODipine (NORVASC) 5 MG tablet Take 1 tablet (5 mg total) by mouth daily. 11/08/16   Bonnielee Haff, MD  Ascorbic Acid (VITAMIN C) 1000 MG tablet Take 1,000 mg by mouth daily.    [provider]  aspirin EC 81 MG tablet Take 81 mg by mouth every evening.     [provider]  atorvastatin (LIPITOR) 40 MG tablet Take 40 mg by mouth daily at 6 PM.     [provider]  Calcium Carbonate-Vitamin D (CALTRATE 600+D PO) Take 1 tablet by mouth daily.    [provider]  carvedilol (COREG) 25 MG tablet TAKE 1 TABLET BY MOUTH TWO  TIMES DAILY 12/19/17   Jettie Booze, MD  citalopram (CELEXA) 20 MG tablet Take 20 mg by mouth every morning.     [provider]  clopidogrel (PLAVIX) 75 MG tablet Take 75 mg by mouth every morning.     [provider]  Cyanocobalamin (B-12) 2500 MCG TABS Take 2,500 mcg by mouth daily.    [provider]  FERROUS SULFATE PO Take 325 mg by mouth daily.    [provider]  fish oil-omega-3 fatty acids 1000 MG capsule Take 1 g by mouth daily.    [provider]  furosemide (LASIX) 20 MG tablet  11/27/17   [provider]  gabapentin (NEURONTIN) 300 MG capsule Take 300 mg by mouth 4 (four) times daily.  02/28/16   [provider]  glipiZIDE (GLUCOTROL XL) 10 MG 24 hr tablet Take 10 mg by mouth daily  with breakfast.     [provider]  HYDROcodone-acetaminophen (NORCO) 10-325 MG tablet Take 1 tablet by mouth 2 (two) times daily as needed for severe pain.  10/25/16   [provider]  ibuprofen (ADVIL,MOTRIN) 400 MG tablet Take 400 mg by mouth every 6 (six) hours as needed.    [provider]  Magnesium 250 MG TABS Take 250 mg by mouth daily.    [provider]  meclizine (ANTIVERT) 25 MG tablet Take 25 mg by mouth 2 (two) times daily as needed for dizziness. For dizziness     [provider]  metFORMIN (GLUCOPHAGE) 1000 MG tablet Take 1,000 mg by mouth 2 (two) times daily with a meal.  02/09/15   [provider]  methocarbamol (ROBAXIN) 500 MG tablet Take 1 tablet (500 mg total) by mouth every 8 (eight) hours as needed for muscle spasms. 08/06/16   Mikhail, Velta Addison, DO  nitroGLYCERIN (NITROSTAT) 0.4 MG SL tablet Place 1 tablet (0.4 mg total) under the tongue every 5 (five) minutes as needed. Chest pain. Patient taking differently: Place 0.4 mg under the tongue every 5 (five) minutes as needed for chest pain.  09/28/13   Jettie Booze, MD  pantoprazole (PROTONIX) 40 MG tablet Take 40 mg by mouth daily as needed (INDIGESTION).  01/10/15   [provider]  predniSONE (DELTASONE) 20 MG tablet Take 1  tablet (20 mg total) by mouth 2 (two) times daily with a meal. 06/02/17   Tanna Furry, MD  promethazine (PHENERGAN) 12.5 MG tablet Take 12.5 mg by mouth every 6 (six) hours as needed for nausea or vomiting.    [provider]  tamsulosin (FLOMAX) 0.4 MG CAPS capsule Take 0.4 mg by mouth daily.  02/24/17   [provider]    Family History Family History  Problem Relation Age of Onset  . Heart attack Father   . Heart disease Father 86       Before age of 61  . Hyperlipidemia Father   . Heart disease Brother        Heart dissease before age 57  . Hyperlipidemia Brother   . Cirrhosis Mother   . Heart attack Paternal Grandfather   . Heart disease Paternal Grandfather   . Cancer Maternal Grandmother   . Alcoholism Maternal Grandfather   . Heart attack Paternal Grandmother     Social History Social History   Tobacco Use  . Smoking status: Never Smoker  . Smokeless tobacco: Never Used  Substance Use Topics  . Alcohol use: No    Alcohol/week: 0.0 standard drinks  . Drug use: No     Allergies   Codeine; Latex; Morphine and related; Cyclobenzaprine; and Methocarbamol   Review of Systems Review of Systems  All other systems reviewed and are negative.    Physical Exam Updated Vital Signs BP (!) 141/55   Pulse 75   Resp 17   SpO2 97%   Physical Exam Vitals signs and nursing note reviewed.  Constitutional:      Appearance: She is well-developed.  HENT:     Head: Normocephalic.     Comments: Ecchymosis to the left chin and left lower lip. Able to fully open and close mouth without difficulty. Neck:     Comments: No C-spine tenderness Cardiovascular:     Rate and Rhythm: Normal rate and regular rhythm.     Comments: Systolic ejection murmur Pulmonary:     Effort: Pulmonary effort is normal. No respiratory distress.  Comments: Decreased air movement in the bilateral bases Abdominal:     Palpations: Abdomen is soft.     Tenderness: There is no  abdominal tenderness. There is no guarding or rebound.  Musculoskeletal:     Comments: One plus edema to bilateral lower extremities, non-pitting. 2+ DP pulses bilaterally. There is diffuse tenderness to palpation of the left knee without palpable effusion. Able to flex and extend the knee but there is pain present with range of motion.  Skin:    General: Skin is warm and dry.  Neurological:     Mental Status: She is alert and oriented to person, place, and time.  Psychiatric:        Mood and Affect: Mood normal.        Behavior: Behavior normal.      ED Treatments / Results  Labs (all labs ordered are listed, but only abnormal results are displayed) Labs Reviewed  COMPREHENSIVE METABOLIC PANEL - Abnormal; Notable for the following components:      Result Value   Glucose, Bld 150 (*)    BUN 36 (*)    Creatinine, Ser 1.10 (*)    GFR calc non Af Amer 49 (*)    GFR calc Af Amer 56 (*)    All other components within normal limits  D-DIMER, QUANTITATIVE (NOT AT [facility]) - Abnormal; Notable for the following components:   D-Dimer, Quant 0.94 (*)    All other components within normal limits  CBC WITH DIFFERENTIAL/PLATELET - Abnormal; Notable for the following components:   RBC 3.63 (*)    MCV 107.4 (*)    All other components within normal limits  URINALYSIS, ROUTINE W REFLEX MICROSCOPIC - Abnormal; Notable for the following components:   Color, Urine STRAW (*)    All other components within normal limits  I-STAT CHEM 8, ED - Abnormal; Notable for the following components:   BUN 45 (*)    Creatinine, Ser 1.10 (*)    Glucose, Bld 142 (*)    All other components within normal limits  LIPASE, BLOOD  I-STAT TROPONIN, ED    EKG EKG Interpretation  Date/Time:  Sunday December 21 2017 18:52:15 EST Ventricular Rate:  75 PR Interval:    QRS Duration: 104 QT Interval:  411 QTC Calculation: 460 R Axis:   74 Text Interpretation:  Sinus rhythm Nonspecific T abnormalities, anterior  leads Confirmed by Quintella Reichert [phone]) on 12/21/2017 7:09:15 PM   Radiology Ct Head Wo Contrast  Result Date: 12/21/2017 CLINICAL DATA:  Fall. EXAM: CT HEAD WITHOUT CONTRAST TECHNIQUE: Contiguous axial images were obtained from the base of the skull through the vertex without intravenous contrast. COMPARISON:  MR brain dated August 05, 2017. FINDINGS: Brain: No evidence of acute infarction, hemorrhage, hydrocephalus, extra-axial collection or mass lesion/mass effect. Stable mild age related cerebral atrophy. Vascular: Calcified atherosclerosis at the skullbase. No hyperdense vessel. Skull: Negative for fracture or focal lesion. Sinuses/Orbits: No acute finding. Other: None. IMPRESSION: 1.  No acute intracranial abnormality. Electronically Signed   By: Titus Dubin M.D.   On: 12/21/2017 19:50   Ct Angio Chest Pe W/cm &/or Wo Cm  Result Date: 12/21/2017 CLINICAL DATA:  Fall, chest wall pain. EXAM: CT ANGIOGRAPHY CHEST WITH CONTRAST TECHNIQUE: Multidetector CT imaging of the chest was performed using the standard protocol during bolus administration of intravenous contrast. Multiplanar CT image reconstructions and MIPs were obtained to evaluate the vascular anatomy. CONTRAST:  147mL ISOVUE-370 IOPAMIDOL (ISOVUE-370) INJECTION 76% COMPARISON:  Chest CT 04/29/2017.  Chest x-ray earlier today. FINDINGS: Cardiovascular: Prior CABG. Cardiomegaly. Aortic atherosclerosis. No evidence of aortic aneurysm. No filling defects in the pulmonary arteries to suggest pulmonary emboli. Mediastinum/Nodes: No mediastinal, hilar, or axillary adenopathy. Lungs/Pleura: Linear areas of atelectasis in the lower lobes bilaterally. Bibasilar atelectasis. No effusions or confluent airspace opacities. Upper Abdomen: Imaging into the upper abdomen shows no acute findings. Musculoskeletal: Chest wall soft tissues are unremarkable. No acute bony abnormality. Review of the MIP images confirms the above findings. IMPRESSION:  Cardiomegaly.  Bibasilar atelectasis. Prior CABG. No evidence of pulmonary embolus. Aortic Atherosclerosis (ICD10-I70.0). Electronically Signed   By: Rolm Baptise M.D.   On: 12/21/2017 20:58   Dg Chest Port 1 View  Result Date: 12/21/2017 CLINICAL DATA:  77 year old female status post fall. Chest wall pain. Hypoxia. EXAM: PORTABLE CHEST 1 VIEW COMPARISON:  10/27/2017 chest radiographs and earlier. FINDINGS: Portable AP semi upright view at 1853 hours. Lower lung volumes. Stable cardiomegaly and mediastinal contours. Prior CABG. Stable pulmonary vascular congestion or increased interstitial markings from earlier this year. No pneumothorax, pleural effusion or confluent pulmonary opacity. No acute osseous abnormality identified. IMPRESSION: No acute cardiopulmonary abnormality or acute traumatic injury identified. Electronically Signed   By: Genevie Ann M.D.   On: 12/21/2017 19:18   Dg Knee Complete 4 Views Left  Result Date: 12/21/2017 CLINICAL DATA:  Fall with knee pain EXAM: LEFT KNEE - COMPLETE 4+ VIEW COMPARISON:  None. FINDINGS: No evidence of fracture, dislocation, or joint effusion. No evidence of arthropathy or other focal bone abnormality. Soft tissues are unremarkable. IMPRESSION: Negative. Electronically Signed   By: Ulyses Jarred M.D.   On: 12/21/2017 19:23    Procedures Procedures (including critical care time)  Medications Ordered in ED Medications  sodium chloride (PF) 0.9 % injection (has no administration in time range)  iopamidol (ISOVUE-370) 76 % injection 100 mL (100 mLs Intravenous Contrast Given 12/21/17 2036)     Initial Impression / Assessment and Plan / ED Course  I have reviewed the triage vital signs and the nursing notes.  Pertinent labs & imaging results that were available during my care of the patient were reviewed by me and considered in my medical decision making (see chart for details).     Patient here for evaluation following two falls, when today when a  few days ago. She does have pain in her chest and upper abdomen that she attributes to her fall on Thursday. There is no ecchymosis or evidence of trauma on examination. She denies shortness of breath and has clear lungs on evaluation but is noted to be significantly hypoxic to the mid 70s on room air. D dimer was mildly elevated and CTA was obtained, which was negative for PE. No evidence of serious injuries secondary to her falls. Given persistent hypoxia medicine consulted for admission.  Final Clinical Impressions(s) / ED Diagnoses   Final diagnoses:  None    ED Discharge Orders    None       Quintella Reichert, MD 12/22/17 [phone]

## 2017-12-21 NOTE — H&P (Signed)
History and Physical    Andrea Kirk IDP:[id number] DOB: Nov 24, 1940 DOA: 12/21/2017  PCP: Aura Dials, MD  Patient coming from: [facility] independent [facility]  I have personally briefly reviewed patient's old medical records in [place]  Chief Complaint: Hypoxia  HPI: Andrea Kirk is a 77 y.o. female with medical history significant for CAD s/p CABG, T2DM, HTN, HLD, Diastolic CHF (EF 44-31% by G1DD by TTE 12/16/17), Right sided Vestibular Schwannoma, Depression, and Chronic Pain who presents from her independent living facility by EMS for evaluation of 2 recent falls.  She is very somnolent initially on exam and is unable to provide much recent history but will arouse and answer simple questions and follow commands with persistent verbal stimulation.  Remainder of history is obtained from ED provider and chart review.  Patient reportedly fell 3 days ago while getting the mail and struck her head.  She has had pain in her upper abdomen and chest wall since that time.  She had a second fall today in the bathroom.  She is unsure why she falls and denies any loss of consciousness or symptoms immediately prior to her falls.  Last month she was started on Lasix for lower extremity edema.  She had an echocardiogram by her cardiologist which showed EF 54-00%, grade 1 diastolic dysfunction, with age-related stiffening and instructed to manage with low-salt diet and diuretics.  She has a right-sided vestibular schwannoma and history of frequent falls.  MRI 08/05/2017 showed a stable 4 x 5 mm vestibular schwannoma in the right internal auditory canal.  She has chronic pain and is prescribed Norco 10-325 mg twice daily as needed.  She has a prior history of anemia and is on iron and B12 supplementation.  Patient denies any dyspnea, cough, nausea, vomiting, diarrhea, constipation, dysuria, epistaxis, hemoptysis, hematemesis, hematochezia, or melena.  ED Course:  Initial vitals showed BP 120/63, pulse  75, RR 15, SPO2 initially mid 70s on room air improved to >92% on 3 L O2 via [place].  Labs are notable for WBC 7.7, hemoglobin 12.3, platelets 174, BUN 36, creatinine 1.1, AST 35, ALT 17, alkaline phosphatase 64, total bilirubin 1.1, lipase 51, negative i-STAT troponin.  Urinalysis was negative for UTI.  D-dimer was obtained and 0.94.  Chest x-ray showed prior CABG changes with lower lung volumes, no consolidation or pleural effusion.  Left knee x-ray was negative for fracture, dislocation, or joint effusion.  CT head without contrast was negative for acute infarction, hemorrhage, or mass.  CTA chest PE study was negative for evidence of pulmonary embolus, showed bibasilar atelectasis.  Due to persistent hypoxia hospitalist service was consulted to admit.  Review of Systems: As per HPI otherwise 10 point review of systems negative.    Past Medical History:  Diagnosis Date  . Acute kidney injury ([place]) 08/05/2016  . Aftercare following surgery of the circulatory system, [place] 05/11/2013  . AKI (acute kidney injury) ([place]) 08/04/2016  . Anxiety    takes Celexa daily  . ARF (acute renal failure) ([place]) 11/07/2016  . Asymptomatic stenosis of right carotid artery 03/22/2016  . Back pain    occasionally  . Carotid artery disease ([place]) 04/16/2013  . Carotid artery occlusion   . Carotid stenosis 11/16/2013  . Cataract    left and immature  . Coronary artery disease   . Coronary atherosclerosis of native coronary artery 03/11/2013   S/p CABG in 1997   . Depression   . Diabetes mellitus    takes Metformin  and Glipizide daily  . Diabetes mellitus ([place]) 05/02/2015  . Dizziness    takes Meclizine daily as needed  . Essential hypertension, benign 03/11/2013  . GERD (gastroesophageal reflux disease)    takes Omeprazole daily as needed  . Headache(784.0)   . Hyperlipidemia    takes Atorvastatin daily  . Hypertension    takes Carvedilol daily  . Mixed hyperlipidemia 03/11/2013  . Muscle spasm    takes Robaxin  daily as needed  . Nausea    takes Phenergan daily as needed  . Occlusion and stenosis of carotid artery without mention of cerebral infarction 07/19/2011  . Pneumonia    hx of-in high school  . Restless leg    takes Requip daily as needed  . Seasonal allergies    takes Claritin daily as needed and Afrin as needed  . Shortness of breath    with exertion  . Urinary urgency   . UTI (urinary tract infection) 11/07/2016    Past Surgical History:  Procedure Laterality Date  . COLONOSCOPY WITH PROPOFOL N/A 03/10/2012   Procedure: COLONOSCOPY WITH PROPOFOL;  Surgeon: Garlan Fair, MD;  Location: [facility];  Service: Endoscopy;  Laterality: N/A;  . CORNEAL TRANSPLANT Right   . CORONARY ARTERY BYPASS GRAFT  1997   x 6  . CORONARY ARTERY BYPASS GRAFT  Jan. 1997  . ENDARTERECTOMY Left 04/16/2013   Procedure: Left Carotid Artery Endatarectomy with Resection of Redundant Internal Carotid Artery;  Surgeon: Rosetta Posner, MD;  Location: [facility];  Service: Vascular;  Laterality: Left;  . ENDARTERECTOMY Right 03/22/2016   Procedure: RIGHT ENDARTERECTOMY CAROTID;  Surgeon: Rosetta Posner, MD;  Location[facility];  Service: Vascular;  Laterality: Right;  . ESOPHAGOGASTRODUODENOSCOPY N/A 03/10/2012   Procedure: ESOPHAGOGASTRODUODENOSCOPY (EGD);  Surgeon: Garlan Fair, MD;  Location: [facility];  Service: Endoscopy;  Laterality: N/A;  . EYE SURGERY  March 12, 2001   CORNEA TRANSPLANT Right eye  . PATCH ANGIOPLASTY Right 03/22/2016   Procedure: PATCH ANGIOPLASTY;  Surgeon: Rosetta Posner, MD;  Location: [facility];  Service: Vascular;  Laterality: Right;  . PR VEIN BYPASS GRAFT,AORTO-FEM-POP  1997  . SPINE SURGERY  march 2013   Back surgery  . TONSILLECTOMY    . TRIGGER FINGER RELEASE Left    thumb     reports that she has never smoked. She has never used smokeless tobacco. She reports that she does not drink alcohol or use drugs.  Allergies  Allergen Reactions  . Codeine Other (See Comments)     Abnormal behavior  . Latex Other (See Comments)    tears skin  . Morphine And Related Other (See Comments)    Affects BP and blood sugar.  . Cyclobenzaprine     MADE PT SICK- pt unsure if med was cyclobenzaprine or methocarbamol   . Methocarbamol Other (See Comments)    MADE PT SICK- pt unsure if med was cyclobenzaprine or methocarbamol     Family History  Problem Relation Age of Onset  . Heart attack Father   . Heart disease Father 63       Before age of 21  . Hyperlipidemia Father   . Heart disease Brother        Heart dissease before age 95  . Hyperlipidemia Brother   . Cirrhosis Mother   . Heart attack Paternal Grandfather   . Heart disease Paternal Grandfather   . Cancer Maternal Grandmother   . Alcoholism Maternal Grandfather   . Heart attack  Paternal Grandmother      Prior to Admission medications   Medication Sig Start Date End Date Taking? Authorizing Provider  acetaminophen (TYLENOL) 325 MG tablet Take 650 mg by mouth every 6 (six) hours as needed for moderate pain.    [provider]  amLODipine (NORVASC) 5 MG tablet Take 1 tablet (5 mg total) by mouth daily. 11/08/16   Bonnielee Haff, MD  Ascorbic Acid (VITAMIN C) 1000 MG tablet Take 1,000 mg by mouth daily.    [provider]  aspirin EC 81 MG tablet Take 81 mg by mouth every evening.     [provider]  atorvastatin (LIPITOR) 40 MG tablet Take 40 mg by mouth daily at 6 PM.     [provider]  Calcium Carbonate-Vitamin D (CALTRATE 600+D PO) Take 1 tablet by mouth daily.    [provider]  carvedilol (COREG) 25 MG tablet TAKE 1 TABLET BY MOUTH TWO  TIMES DAILY 12/19/17   Jettie Booze, MD  citalopram (CELEXA) 20 MG tablet Take 20 mg by mouth every morning.     [provider]  clopidogrel (PLAVIX) 75 MG tablet Take 75 mg by mouth every morning.     [provider]  Cyanocobalamin (B-12) 2500 MCG TABS Take 2,500 mcg by mouth daily.    [provider]  FERROUS SULFATE PO Take 325 mg by mouth daily.    [provider]  fish oil-omega-3 fatty acids 1000 MG capsule Take 1 g by mouth daily.    [provider]  furosemide (LASIX) 20 MG tablet  11/27/17   [provider]  gabapentin (NEURONTIN) 300 MG capsule Take 300 mg by mouth 4 (four) times daily.  02/28/16   [provider]  glipiZIDE (GLUCOTROL XL) 10 MG 24 hr tablet Take 10 mg by mouth daily with breakfast.     [provider]  HYDROcodone-acetaminophen (NORCO) 10-325 MG tablet Take 1 tablet by mouth 2 (two) times daily as needed for severe pain.  10/25/16   [provider]  ibuprofen (ADVIL,MOTRIN) 400 MG tablet Take 400 mg by mouth every 6 (six) hours as needed.    [provider]  Magnesium 250 MG TABS Take 250 mg by mouth daily.    [provider]  meclizine (ANTIVERT) 25 MG tablet Take 25 mg by mouth 2 (two) times daily as needed for dizziness. For dizziness     [provider]  metFORMIN (GLUCOPHAGE) 1000 MG tablet Take 1,000 mg by mouth 2 (two) times daily with a meal.  02/09/15   [provider]  methocarbamol (ROBAXIN) 500 MG tablet Take 1 tablet (500 mg total) by mouth every 8 (eight) hours as needed for muscle spasms. 08/06/16   Mikhail, Velta Addison, DO  nitroGLYCERIN (NITROSTAT) 0.4 MG SL tablet Place 1 tablet (0.4 mg total) under the tongue every 5 (five) minutes as needed. Chest pain. Patient taking differently: Place 0.4 mg under the tongue every 5 (five) minutes as needed for chest pain.  09/28/13   Jettie Booze, MD  pantoprazole (PROTONIX) 40 MG tablet Take 40 mg by mouth daily as needed (INDIGESTION).  01/10/15   [provider]  predniSONE (DELTASONE) 20 MG tablet Take 1 tablet (20 mg total) by mouth 2 (two) times daily with a meal. 06/02/17   Tanna Furry, MD  promethazine (PHENERGAN) 12.5 MG tablet Take 12.5 mg by mouth every 6 (six) hours as needed for nausea or  vomiting.    [provider]  tamsulosin (FLOMAX) 0.4 MG CAPS capsule Take 0.4 mg by mouth daily.  02/24/17   [provider]    Physical Exam: Vitals:   12/21/17 2204 12/22/17 0009 12/22/17 0053 12/22/17 0055  BP: (!) 152/67 (!) 141/55  (!) 186/93  Pulse: 72 75  75  Resp: 11 17  16   Temp:    (!) 97.5 F (36.4 C)  TempSrc:    Oral  SpO2: 96% 97%  99%  Height:   5\' 5"  (1.651 m)     Constitutional: Somnolent, NAD, calm Eyes: PERRL, lids and conjunctivae normal ENMT: Mucous membranes are moist. Posterior pharynx clear of any exudate or lesions.poor dentition.  Neck: normal, supple, no masses. Respiratory: clear to auscultation bilaterally, no wheezing, no crackles. Normal respiratory effort. No accessory muscle use.  Cardiovascular: Regular rate and rhythm, no murmurs / rubs / gallops.  Appears euvolemic. Abdomen: Mild epigastric tenderness, no masses palpated. No hepatosplenomegaly. Bowel sounds positive.  Musculoskeletal: no clubbing / cyanosis. No joint deformity upper and lower extremities. Good ROM, no contractures. Normal muscle tone.  Skin: Ecchymosis left chin.  No cyanosis, skin is warm and dry. Neurologic: CN 2-12 grossly intact. Sensation intact, finger-to-nose intact, strength 5/5 in all 4.  She has asterixis. Psychiatric: Somnolent on exam, will arouse and follow commands and interact with persistent stimulation. Oriented x 3.    Labs on Admission: I have personally reviewed following labs and imaging studies  CBC: Recent Labs  Lab 12/21/17 1822 12/21/17 1906  WBC 7.7  --   NEUTROABS 4.6  --   HGB 12.3 13.6  HCT 39.0 40.0  MCV 107.4*  --   PLT 174  --    Basic Metabolic Panel: Recent Labs  Lab 12/21/17 1822 12/21/17 1906  NA 136 136  K 4.9 4.9  CL 98 99  CO2 27  --   GLUCOSE 150* 142*  BUN 36* 45*  CREATININE 1.10* 1.10*  CALCIUM 9.5  --    GFR: Estimated Creatinine Clearance: 43.4 mL/min (A) (by C-G formula based on SCr of 1.1  mg/dL (H)). Liver Function Tests: Recent Labs  Lab 12/21/17 1822  AST 35  ALT 17  ALKPHOS 64  BILITOT 1.1  PROT 7.6  ALBUMIN 4.2   Recent Labs  Lab 12/21/17 1822  LIPASE 51   No results for input(s): AMMONIA in the last 168 hours. Coagulation Profile: No results for input(s): INR, PROTIME in the last 168 hours. Cardiac Enzymes: No results for input(s): CKTOTAL, CKMB, CKMBINDEX, TROPONINI in the last 168 hours. BNP (last 3 results) No results for input(s): PROBNP in the last 8760 hours. HbA1C: No results for input(s): HGBA1C in the last 72 hours. CBG: Recent Labs  Lab 12/22/17 0054  GLUCAP 110*   Lipid Profile: No results for input(s): CHOL, HDL, LDLCALC, TRIG, CHOLHDL, LDLDIRECT in the last 72 hours. Thyroid Function Tests: No results for input(s): TSH, T4TOTAL, FREET4, T3FREE, THYROIDAB in the last 72 hours. Anemia Panel: No results for input(s): VITAMINB12, FOLATE, FERRITIN, TIBC, IRON, RETICCTPCT in the last 72 hours. Urine analysis:    Component Value Date/Time   COLORURINE STRAW (A) 12/21/2017 1823   APPEARANCEUR CLEAR 12/21/2017 1823   LABSPEC 1.006 12/21/2017 1823   PHURINE 5.0 12/21/2017 1823   GLUCOSEU NEGATIVE 12/21/2017 1823   HGBUR NEGATIVE 12/21/2017 1823   BILIRUBINUR NEGATIVE 12/21/2017 1823   KETONESUR NEGATIVE 12/21/2017 1823   PROTEINUR NEGATIVE 12/21/2017 1823   UROBILINOGEN 1.0 06/17/2014 2145   NITRITE NEGATIVE 12/21/2017  [place] 12/21/2017 1823    Radiological Exams on Admission: Ct Head Wo Contrast  Result Date: 12/21/2017 CLINICAL DATA:  Fall. EXAM: CT HEAD WITHOUT CONTRAST TECHNIQUE: Contiguous axial images were obtained from the base of the skull through the vertex without intravenous contrast. COMPARISON:  MR brain dated August 05, 2017. FINDINGS: Brain: No evidence of acute infarction, hemorrhage, hydrocephalus, extra-axial collection or mass lesion/mass effect. Stable mild age related cerebral atrophy.  Vascular: Calcified atherosclerosis at the skullbase. No hyperdense vessel. Skull: Negative for fracture or focal lesion. Sinuses/Orbits: No acute finding. Other: None. IMPRESSION: 1.  No acute intracranial abnormality. Electronically Signed   By: Titus Dubin M.D.   On: 12/21/2017 19:50   Ct Angio Chest Pe W/cm &/or Wo Cm  Result Date: 12/21/2017 CLINICAL DATA:  Fall, chest wall pain. EXAM: CT ANGIOGRAPHY CHEST WITH CONTRAST TECHNIQUE: Multidetector CT imaging of the chest was performed using the standard protocol during bolus administration of intravenous contrast. Multiplanar CT image reconstructions and MIPs were obtained to evaluate the vascular anatomy. CONTRAST:  137mL ISOVUE-370 IOPAMIDOL (ISOVUE-370) INJECTION 76% COMPARISON:  Chest CT 04/29/2017.  Chest x-ray earlier today. FINDINGS: Cardiovascular: Prior CABG. Cardiomegaly. Aortic atherosclerosis. No evidence of aortic aneurysm. No filling defects in the pulmonary arteries to suggest pulmonary emboli. Mediastinum/Nodes: No mediastinal, hilar, or axillary adenopathy. Lungs/Pleura: Linear areas of atelectasis in the lower lobes bilaterally. Bibasilar atelectasis. No effusions or confluent airspace opacities. Upper Abdomen: Imaging into the upper abdomen shows no acute findings. Musculoskeletal: Chest wall soft tissues are unremarkable. No acute bony abnormality. Review of the MIP images confirms the above findings. IMPRESSION: Cardiomegaly.  Bibasilar atelectasis. Prior CABG. No evidence of pulmonary embolus. Aortic Atherosclerosis (ICD10-I70.0). Electronically Signed   By: Rolm Baptise M.D.   On: 12/21/2017 20:58   Dg Chest Port 1 View  Result Date: 12/21/2017 CLINICAL DATA:  77 year old female status post fall. Chest wall pain. Hypoxia. EXAM: PORTABLE CHEST 1 VIEW COMPARISON:  10/27/2017 chest radiographs and earlier. FINDINGS: Portable AP semi upright view at 1853 hours. Lower lung volumes. Stable cardiomegaly and mediastinal contours. Prior  CABG. Stable pulmonary vascular congestion or increased interstitial markings from earlier this year. No pneumothorax, pleural effusion or confluent pulmonary opacity. No acute osseous abnormality identified. IMPRESSION: No acute cardiopulmonary abnormality or acute traumatic injury identified. Electronically Signed   By: Genevie Ann M.D.   On: 12/21/2017 19:18   Dg Knee Complete 4 Views Left  Result Date: 12/21/2017 CLINICAL DATA:  Fall with knee pain EXAM: LEFT KNEE - COMPLETE 4+ VIEW COMPARISON:  None. FINDINGS: No evidence of fracture, dislocation, or joint effusion. No evidence of arthropathy or other focal bone abnormality. Soft tissues are unremarkable. IMPRESSION: Negative. Electronically Signed   By: Ulyses Jarred M.D.   On: 12/21/2017 19:23    EKG: Independently reviewed.  Normal sinus rhythm, T wave flattening anterior leads.  Assessment/Plan Principal Problem:   Hypoxia Active Problems:   Coronary artery disease   Essential hypertension, benign   Hyperlipidemia   Diabetes mellitus (HCC)   Depression   Chronic pain   Unilateral vestibular schwannoma (HCC)   Falls   Chronic diastolic CHF (congestive heart failure) (HCC)    Andrea Kirk is a 77 y.o. female with medical history significant for CAD s/p CABG, T2DM, HTN, HLD, Diastolic CHF (EF 38-25% by G1DD by TTE 12/16/17), Right sided Vestibular Schwannoma, Depression, and Chronic Pain who presents from her independent living facility by EMS after 2 recent falls found to have  persistent hypoxia in the ED without an obvious cause.  Hypoxia: Suspect multifactorial from excessive somnolence due to medication (Norco), atelectasis, mild uremia from diuretics (BUN 36, asterixis present on exam).  Chest x-ray without any evidence of pneumonia.  CTA is negative for pulmonary embolus.  She has history of anemia but hemoglobin is stable at 12.3 without any obvious bleeding.  She has no known history of COPD or OSA.  During my examination her  oxygen saturations did drop below 90% with good waveforms. -Continue supplemental O2 as needed -Check ABG to assess for hypoxemia -Hold home Norco -Hold home Lasix, gentle IV fluids overnight -Incentive spirometry   Frequent falls, history of vestibular schwannoma: Follows with Dr. Constance Holster, ENT, for vestibular schwannoma.  Repeat MRI July 2019 shows stability of right-sided schwannoma.  Suspect this is contributing some to her falls.  No evidence of serious injury on admission from recent falls. -PT evaluation  Chronic diastolic CHF: EF 94-07%, grade 1 diastolic dysfunction by TTE 12/16/2017.  She was started on Lasix last month.  Appears euvolemic on exam.  No dyspnea or acute exacerbation at this time. -Holding Lasix as above -gentle IV fluids overnight, monitor I/O's -Continue home Coreg   CAD s/p CABG: Patient with mild epigastric/chest wall pain without suspicion for cardiac pain.  EKG and initial troponin negative. -Continue aspirin and Plavix -Continue Coreg -Continue atorvastatin  Type 2 diabetes: On metformin and glipizide at home. -SSI while inpatient  Hypertension: Blood pressure slightly elevated on admission. -Continue home amlodipine and Coreg -Holding Lasix as above  Hyperlipidemia: -Continue atorvastatin  Depression: -Continue Celexa  Chronic pain: Prescribed Norco 10-325 mg twice a day as needed. -We will hold for now given excess somnolence and hypoxia   DVT prophylaxis: Lovenox Code Status: DNR, confirmed with patient Family Communication: None present on admission Disposition Plan: Anticipate discharge in 1 day pending improvement in hypoxia Consults called: None Admission status: Observation   Zada Finders MD [facility] Pager [phone]  If 7PM-7AM, please contact night-coverage [email or web address] Password [email or web address]  12/22/2017, 12:59 AM

## 2017-12-21 NOTE — ED Notes (Signed)
Urine culture sent down to lab with urinalysis. 

## 2017-12-21 NOTE — ED Triage Notes (Signed)
Patient from [facility], fell. Second fall since Thursday 12/18/17. C/o left knee pain and chest wall pain with ROM. Denies LOC. Per EMS, all injuries on patient are from previous fall on 12/18/17

## 2017-12-22 ENCOUNTER — Encounter (HOSPITAL_COMMUNITY): Payer: Self-pay

## 2017-12-22 DIAGNOSIS — I25119 Atherosclerotic heart disease of native coronary artery with unspecified angina pectoris: Secondary | ICD-10-CM | POA: Diagnosis not present

## 2017-12-22 DIAGNOSIS — W19XXXA Unspecified fall, initial encounter: Secondary | ICD-10-CM

## 2017-12-22 DIAGNOSIS — F329 Major depressive disorder, single episode, unspecified: Secondary | ICD-10-CM

## 2017-12-22 DIAGNOSIS — J9601 Acute respiratory failure with hypoxia: Secondary | ICD-10-CM

## 2017-12-22 DIAGNOSIS — I5032 Chronic diastolic (congestive) heart failure: Secondary | ICD-10-CM | POA: Diagnosis not present

## 2017-12-22 DIAGNOSIS — G8929 Other chronic pain: Secondary | ICD-10-CM

## 2017-12-22 DIAGNOSIS — D333 Benign neoplasm of cranial nerves: Secondary | ICD-10-CM

## 2017-12-22 DIAGNOSIS — E1159 Type 2 diabetes mellitus with other circulatory complications: Secondary | ICD-10-CM

## 2017-12-22 DIAGNOSIS — W19XXXD Unspecified fall, subsequent encounter: Secondary | ICD-10-CM

## 2017-12-22 DIAGNOSIS — F32A Depression, unspecified: Secondary | ICD-10-CM

## 2017-12-22 LAB — BLOOD GAS, ARTERIAL
Acid-Base Excess: 4.9 mmol/L — ABNORMAL HIGH (ref 0.0–2.0)
Bicarbonate: 30.4 mmol/L — ABNORMAL HIGH (ref 20.0–28.0)
Drawn by: 232811
O2 Content: 2 L/min
O2 Saturation: 95.6 %
Patient temperature: 98.6
pCO2 arterial: 50.9 mmHg — ABNORMAL HIGH (ref 32.0–48.0)
pH, Arterial: 7.393 (ref 7.350–7.450)
pO2, Arterial: 78.8 mmHg — ABNORMAL LOW (ref 83.0–108.0)

## 2017-12-22 LAB — BASIC METABOLIC PANEL
Anion gap: 13 (ref 5–15)
BUN: 30 mg/dL — ABNORMAL HIGH (ref 8–23)
CO2: 26 mmol/L (ref 22–32)
Calcium: 9.3 mg/dL (ref 8.9–10.3)
Chloride: 101 mmol/L (ref 98–111)
Creatinine, Ser: 0.86 mg/dL (ref 0.44–1.00)
GFR calc Af Amer: >60 mL/min (ref >60)
GFR calc non Af Amer: >60 mL/min (ref >60)
Glucose, Bld: 183 mg/dL — ABNORMAL HIGH (ref 70–99)
Potassium: 4.8 mmol/L (ref 3.5–5.1)
Sodium: 140 mmol/L (ref 135–145)

## 2017-12-22 LAB — CBC
HCT: 35 % — ABNORMAL LOW (ref 36.0–46.0)
Hemoglobin: 11.2 g/dL — ABNORMAL LOW (ref 12.0–15.0)
MCH: 34 pg (ref 26.0–34.0)
MCHC: 32 g/dL (ref 30.0–36.0)
MCV: 106.4 fL — ABNORMAL HIGH (ref 80.0–100.0)
Platelets: 161 10*3/uL (ref 150–400)
RBC: 3.29 MIL/uL — ABNORMAL LOW (ref 3.87–5.11)
RDW: 12.8 % (ref 11.5–15.5)
WBC: 6 10*3/uL (ref 4.0–10.5)
nRBC: 0 % (ref 0.0–0.2)

## 2017-12-22 LAB — GLUCOSE, CAPILLARY
Glucose-Capillary: 110 mg/dL — ABNORMAL HIGH (ref 70–99)
Glucose-Capillary: 128 mg/dL — ABNORMAL HIGH (ref 70–99)
Glucose-Capillary: 150 mg/dL — ABNORMAL HIGH (ref 70–99)
Glucose-Capillary: 152 mg/dL — ABNORMAL HIGH (ref 70–99)

## 2017-12-22 LAB — AMMONIA: Ammonia: 34 umol/L (ref 9–35)

## 2017-12-22 LAB — MRSA PCR SCREENING: MRSA by PCR: POSITIVE — AB

## 2017-12-22 MED ORDER — B-12 2500 MCG PO TABS: 2500.0000 ug | ORAL_TABLET | Freq: Every day | ORAL | Status: DC

## 2017-12-22 MED ORDER — FERROUS SULFATE 325 (65 FE) MG PO TABS: 325.0000 mg | ORAL_TABLET | Freq: Every day | ORAL | Status: DC

## 2017-12-22 MED ORDER — INSULIN ASPART 100 UNIT/ML ~~LOC~~ SOLN
0.0000 [IU] | Freq: Three times a day (TID) | SUBCUTANEOUS | Status: DC
  Administered 2017-12-22 (×2): 1 [IU] via SUBCUTANEOUS

## 2017-12-22 MED ORDER — CLOPIDOGREL BISULFATE 75 MG PO TABS: 75.0000 mg | ORAL_TABLET | Freq: Every morning | ORAL | Status: DC

## 2017-12-22 MED ORDER — ACETAMINOPHEN 325 MG PO TABS
650.0000 mg | ORAL_TABLET | Freq: Four times a day (QID) | ORAL | Status: DC | PRN
  Administered 2017-12-22: 650 mg via ORAL
  Filled 2017-12-22: qty 2

## 2017-12-22 MED ORDER — HYDROCODONE-ACETAMINOPHEN 10-325 MG PO TABS
1.0000 | ORAL_TABLET | Freq: Two times a day (BID) | ORAL | Status: DC | PRN
  Administered 2017-12-22: 1 via ORAL
  Filled 2017-12-22: qty 1

## 2017-12-22 MED ORDER — INSULIN ASPART 100 UNIT/ML ~~LOC~~ SOLN: 0.0000 [IU] | Freq: Every day | SUBCUTANEOUS | Status: DC

## 2017-12-22 MED ORDER — SODIUM CHLORIDE 0.9 % IV SOLN
INTRAVENOUS | Status: AC
  Administered 2017-12-22: 01:00:00 via INTRAVENOUS

## 2017-12-22 MED ORDER — ONDANSETRON HCL 4 MG/2ML IJ SOLN
4.0000 mg | Freq: Three times a day (TID) | INTRAMUSCULAR | Status: DC | PRN
  Administered 2017-12-22: 4 mg via INTRAVENOUS
  Filled 2017-12-22: qty 2

## 2017-12-22 MED ORDER — TAMSULOSIN HCL 0.4 MG PO CAPS: 0.4000 mg | ORAL_CAPSULE | Freq: Every day | ORAL | Status: DC

## 2017-12-22 MED ORDER — LIP MEDEX EX OINT
TOPICAL_OINTMENT | CUTANEOUS | Status: AC
  Administered 2017-12-22: 1
  Filled 2017-12-22: qty 7

## 2017-12-22 MED ORDER — MUPIROCIN 2 % EX OINT
1.0000 "application " | TOPICAL_OINTMENT | Freq: Two times a day (BID) | CUTANEOUS | Status: DC
  Administered 2017-12-22: 1 via NASAL
  Filled 2017-12-22: qty 22

## 2017-12-22 MED ORDER — VITAMIN C 500 MG PO TABS
1000.0000 mg | ORAL_TABLET | Freq: Every day | ORAL | Status: DC
  Administered 2017-12-22: 1000 mg via ORAL
  Filled 2017-12-22: qty 2

## 2017-12-22 MED ORDER — CITALOPRAM HYDROBROMIDE 20 MG PO TABS
20.0000 mg | ORAL_TABLET | Freq: Every morning | ORAL | Status: DC
  Administered 2017-12-22: 20 mg via ORAL
  Filled 2017-12-22: qty 1

## 2017-12-22 MED ORDER — CARVEDILOL 25 MG PO TABS
25.0000 mg | ORAL_TABLET | Freq: Two times a day (BID) | ORAL | Status: DC
  Administered 2017-12-22: 25 mg via ORAL
  Filled 2017-12-22: qty 1

## 2017-12-22 MED ORDER — CHLORHEXIDINE GLUCONATE CLOTH 2 % EX PADS: 6.0000 | MEDICATED_PAD | Freq: Every day | CUTANEOUS | Status: DC

## 2017-12-22 MED ORDER — ASPIRIN EC 81 MG PO TBEC: 81.0000 mg | DELAYED_RELEASE_TABLET | Freq: Every evening | ORAL | Status: DC

## 2017-12-22 MED ORDER — ACETAMINOPHEN 650 MG RE SUPP: 650.0000 mg | Freq: Four times a day (QID) | RECTAL | Status: DC | PRN

## 2017-12-22 MED ORDER — ENOXAPARIN SODIUM 40 MG/0.4ML ~~LOC~~ SOLN
40.0000 mg | Freq: Every day | SUBCUTANEOUS | Status: DC
  Administered 2017-12-22: 40 mg via SUBCUTANEOUS
  Filled 2017-12-22: qty 0.4

## 2017-12-22 MED ORDER — PANTOPRAZOLE SODIUM 40 MG PO TBEC: 40.0000 mg | DELAYED_RELEASE_TABLET | Freq: Every day | ORAL | Status: DC | PRN

## 2017-12-22 MED ORDER — VITAMIN B-12 1000 MCG PO TABS: 2500.0000 ug | ORAL_TABLET | Freq: Every day | ORAL | Status: DC

## 2017-12-22 MED ORDER — METHOCARBAMOL 500 MG PO TABS: 500.0000 mg | ORAL_TABLET | Freq: Three times a day (TID) | ORAL | Status: DC | PRN

## 2017-12-22 MED ORDER — LIP MEDEX EX OINT: TOPICAL_OINTMENT | CUTANEOUS | Status: DC | PRN

## 2017-12-22 MED ORDER — ATORVASTATIN CALCIUM 40 MG PO TABS: 40.0000 mg | ORAL_TABLET | Freq: Every day | ORAL | Status: DC

## 2017-12-22 MED ORDER — HYDRALAZINE HCL 20 MG/ML IJ SOLN: 10.0000 mg | Freq: Three times a day (TID) | INTRAMUSCULAR | Status: DC | PRN

## 2017-12-22 MED ORDER — AMLODIPINE BESYLATE 5 MG PO TABS
5.0000 mg | ORAL_TABLET | Freq: Every day | ORAL | Status: DC
  Administered 2017-12-22: 5 mg via ORAL
  Filled 2017-12-22: qty 1

## 2017-12-22 NOTE — ED Notes (Signed)
ED TO INPATIENT HANDOFF REPORT  Name/Age/Gender Roland Rack Geraci 77 y.o. female  Code Status Code Status History    Date Active Date Inactive Code Status Order ID Comments User Context   11/07/2016 0625 11/08/2016 1637 Full Code [id number]  Jani Gravel, MD ED   08/04/2016 0811 08/06/2016 1545 Full Code [id number]  Elwin Mocha, MD ED   03/22/2016 1428 03/23/2016 1541 Full Code [id number]  Alvia Grove, PA-C Inpatient   04/16/2013 1257 04/17/2013 1438 Full Code [id number]  Rosetta Posner, MD Inpatient      Home/SNF/Other Home  Chief Complaint fall  Level of Care/Admitting Diagnosis ED Disposition    ED Disposition Condition [place] [facility]: [facility] [[id number]]  Level of Care: Med-Surg [16]  Diagnosis: Hypoxia [195093]  Admitting Physician: Lenore Cordia [[id number]]  Attending Physician: Lenore Cordia [[id number]]  PT Class (Do Not Modify): Observation [104]  PT Acc Code (Do Not Modify): Observation [10022]       Medical History Past Medical History:  Diagnosis Date  . Acute kidney injury ([place]) 08/05/2016  . Aftercare following surgery of the circulatory system, [place] 05/11/2013  . AKI (acute kidney injury) ([place]) 08/04/2016  . Anxiety    takes Celexa daily  . ARF (acute renal failure) ([place]) 11/07/2016  . Asymptomatic stenosis of right carotid artery 03/22/2016  . Back pain    occasionally  . Carotid artery disease ([place]) 04/16/2013  . Carotid artery occlusion   . Carotid stenosis 11/16/2013  . Cataract    left and immature  . Coronary artery disease   . Coronary atherosclerosis of native coronary artery 03/11/2013   S/p CABG in 1997   . Depression   . Diabetes mellitus    takes Metformin and Glipizide daily  . Diabetes mellitus ([place]) 05/02/2015  . Dizziness    takes Meclizine daily as needed  . Essential hypertension, benign 03/11/2013  . GERD (gastroesophageal reflux disease)    takes Omeprazole daily as needed  . Headache(784.0)   .  Hyperlipidemia    takes Atorvastatin daily  . Hypertension    takes Carvedilol daily  . Mixed hyperlipidemia 03/11/2013  . Muscle spasm    takes Robaxin daily as needed  . Nausea    takes Phenergan daily as needed  . Occlusion and stenosis of carotid artery without mention of cerebral infarction 07/19/2011  . Pneumonia    hx of-in high school  . Restless leg    takes Requip daily as needed  . Seasonal allergies    takes Claritin daily as needed and Afrin as needed  . Shortness of breath    with exertion  . Urinary urgency   . UTI (urinary tract infection) 11/07/2016    Allergies Allergies  Allergen Reactions  . Codeine Other (See Comments)    Abnormal behavior  . Latex Other (See Comments)    tears skin  . Morphine And Related Other (See Comments)    Affects BP and blood sugar.  . Cyclobenzaprine     MADE PT SICK- pt unsure if med was cyclobenzaprine or methocarbamol   . Methocarbamol Other (See Comments)    MADE PT SICK- pt unsure if med was cyclobenzaprine or methocarbamol     IV Location/Drains/Wounds Patient Lines/Drains/Airways Status   Active Line/Drains/Airways    Name:   Placement date:   Placement time:   Site:   Days:   Peripheral IV 12/21/17 Right Antecubital   12/21/17    2044  Antecubital   1          Labs/Imaging Results for orders placed or performed during the hospital encounter of 12/21/17 (from the past 48 hour(s))  Comprehensive metabolic panel     Status: Abnormal   Collection Time: 12/21/17  6:22 PM  Result Value Ref Range   Sodium 136 135 - 145 mmol/L   Potassium 4.9 3.5 - 5.1 mmol/L   Chloride 98 98 - 111 mmol/L   CO2 27 22 - 32 mmol/L   Glucose, Bld 150 (H) 70 - 99 mg/dL   BUN 36 (H) 8 - 23 mg/dL   Creatinine, Ser 1.10 (H) 0.44 - 1.00 mg/dL   Calcium 9.5 8.9 - 10.3 mg/dL   Total Protein 7.6 6.5 - 8.1 g/dL   Albumin 4.2 3.5 - 5.0 g/dL   AST 35 15 - 41 U/L   ALT 17 0 - 44 U/L   Alkaline Phosphatase 64 38 - 126 U/L   Total Bilirubin  1.1 0.3 - 1.2 mg/dL   GFR calc non Af Amer 49 (L) >60 mL/min   GFR calc Af Amer 56 (L) >60 mL/min   Anion gap 11 5 - 15    Comment: Performed at [facility], [place] [address], [place], [place] [address]  Lipase, blood     Status: None   Collection Time: 12/21/17  6:22 PM  Result Value Ref Range   Lipase 51 11 - 51 U/L    Comment: Performed at [facility], [place] [address], [place], [place] [address]  D-dimer, quantitative     Status: Abnormal   Collection Time: 12/21/17  6:22 PM  Result Value Ref Range   D-Dimer, Quant 0.94 (H) 0.00 - 0.50 ug/mL-FEU    Comment: (NOTE) At the manufacturer cut-off of 0.50 ug/mL FEU, this assay has been documented to exclude PE with a sensitivity and negative predictive value of 97 to 99%.  At this time, this assay has not been approved by the FDA to exclude DVT/VTE. Results should be correlated with clinical presentation. Performed at [facility], [place] [address] [place], [place] [address]   CBC with Differential     Status: Abnormal   Collection Time: 12/21/17  6:22 PM  Result Value Ref Range   WBC 7.7 4.0 - 10.5 K/uL   RBC 3.63 (L) 3.87 - 5.11 MIL/uL   Hemoglobin 12.3 12.0 - 15.0 g/dL   HCT 39.0 36.0 - 46.0 %   MCV 107.4 (H) 80.0 - 100.0 fL   MCH 33.9 26.0 - 34.0 pg   MCHC 31.5 30.0 - 36.0 g/dL   RDW 12.9 11.5 - 15.5 %   Platelets 174 150 - 400 K/uL   nRBC 0.0 0.0 - 0.2 %   Neutrophils Relative % 60 %   Neutro Abs 4.6 1.7 - 7.7 K/uL   Lymphocytes Relative 29 %   Lymphs Abs 2.2 0.7 - 4.0 K/uL   Monocytes Relative 9 %   Monocytes Absolute 0.7 0.1 - 1.0 K/uL   Eosinophils Relative 1 %   Eosinophils Absolute 0.1 0.0 - 0.5 K/uL   Basophils Relative 1 %   Basophils Absolute 0.0 0.0 - 0.1 K/uL   Immature Granulocytes 0 %   Abs Immature Granulocytes 0.01 0.00 - 0.07 K/uL    Comment: Performed at [facility], [place] [address] [place], [place] [address]  Urinalysis,  Routine w reflex microscopic     Status: Abnormal   Collection Time:  12/21/17  6:23 PM  Result Value Ref Range   Color, Urine STRAW (A) YELLOW   APPearance CLEAR CLEAR   Specific Gravity, Urine 1.006 1.005 - 1.030   pH 5.0 5.0 - 8.0   Glucose, UA NEGATIVE NEGATIVE mg/dL   Hgb urine dipstick NEGATIVE NEGATIVE   Bilirubin Urine NEGATIVE NEGATIVE   Ketones, ur NEGATIVE NEGATIVE mg/dL   Protein, ur NEGATIVE NEGATIVE mg/dL   Nitrite NEGATIVE NEGATIVE   Leukocytes, UA NEGATIVE NEGATIVE    Comment: Performed at [place] [address], [place], [place] [address]  I-stat troponin, ED     Status: None   Collection Time: 12/21/17  7:04 PM  Result Value Ref Range   Troponin i, poc 0.01 0.00 - 0.08 ng/mL   Comment 3            Comment: Due to the release kinetics of cTnI, a negative result within the first hours of the onset of symptoms does not rule out myocardial infarction with certainty. If myocardial infarction is still suspected, repeat the test at appropriate intervals.   I-stat Chem 8, ED     Status: Abnormal   Collection Time: 12/21/17  7:06 PM  Result Value Ref Range   Sodium 136 135 - 145 mmol/L   Potassium 4.9 3.5 - 5.1 mmol/L   Chloride 99 98 - 111 mmol/L   BUN 45 (H) 8 - 23 mg/dL   Creatinine, Ser 1.10 (H) 0.44 - 1.00 mg/dL   Glucose, Bld 142 (H) 70 - 99 mg/dL   Calcium, Ion 1.20 1.15 - 1.40 mmol/L   TCO2 32 22 - 32 mmol/L   Hemoglobin 13.6 12.0 - 15.0 g/dL   HCT 40.0 36.0 - 46.0 %   Ct Head Wo Contrast  Result Date: 12/21/2017 CLINICAL DATA:  Fall. EXAM: CT HEAD WITHOUT CONTRAST TECHNIQUE: Contiguous axial images were obtained from the base of the skull through the vertex without intravenous contrast. COMPARISON:  MR brain dated August 05, 2017. FINDINGS: Brain: No evidence of acute infarction, hemorrhage, hydrocephalus, extra-axial collection or mass lesion/mass effect. Stable mild age related cerebral atrophy. Vascular: Calcified atherosclerosis  at the skullbase. No hyperdense vessel. Skull: Negative for fracture or focal lesion. Sinuses/Orbits: No acute finding. Other: None. IMPRESSION: 1.  No acute intracranial abnormality. Electronically Signed   By: Titus Dubin M.D.   On: 12/21/2017 19:50   Ct Angio Chest Pe W/cm &/or Wo Cm  Result Date: 12/21/2017 CLINICAL DATA:  Fall, chest wall pain. EXAM: CT ANGIOGRAPHY CHEST WITH CONTRAST TECHNIQUE: Multidetector CT imaging of the chest was performed using the standard protocol during bolus administration of intravenous contrast. Multiplanar CT image reconstructions and MIPs were obtained to evaluate the vascular anatomy. CONTRAST:  17mL ISOVUE-370 IOPAMIDOL (ISOVUE-370) INJECTION 76% COMPARISON:  Chest CT 04/29/2017.  Chest x-ray earlier today. FINDINGS: Cardiovascular: Prior CABG. Cardiomegaly. Aortic atherosclerosis. No evidence of aortic aneurysm. No filling defects in the pulmonary arteries to suggest pulmonary emboli. Mediastinum/Nodes: No mediastinal, hilar, or axillary adenopathy. Lungs/Pleura: Linear areas of atelectasis in the lower lobes bilaterally. Bibasilar atelectasis. No effusions or confluent airspace opacities. Upper Abdomen: Imaging into the upper abdomen shows no acute findings. Musculoskeletal: Chest wall soft tissues are unremarkable. No acute bony abnormality. Review of the MIP images confirms the above findings. IMPRESSION: Cardiomegaly.  Bibasilar atelectasis. Prior CABG. No evidence of pulmonary embolus. Aortic Atherosclerosis (ICD10-I70.0). Electronically Signed   By: Rolm Baptise M.D.   On: 12/21/2017 20:58   Dg Chest [facility] [address]  Result Date: 12/21/2017 CLINICAL DATA:  77 year old female status post fall. Chest wall pain. Hypoxia. EXAM: PORTABLE CHEST 1 VIEW COMPARISON:  10/27/2017 chest radiographs and earlier. FINDINGS: Portable AP semi upright view at 1853 hours. Lower lung volumes. Stable cardiomegaly and mediastinal contours. Prior CABG. Stable pulmonary vascular  congestion or increased interstitial markings from earlier this year. No pneumothorax, pleural effusion or confluent pulmonary opacity. No acute osseous abnormality identified. IMPRESSION: No acute cardiopulmonary abnormality or acute traumatic injury identified. Electronically Signed   By: Genevie Ann M.D.   On: 12/21/2017 19:18   Dg Knee Complete 4 Views Left  Result Date: 12/21/2017 CLINICAL DATA:  Fall with knee pain EXAM: LEFT KNEE - COMPLETE 4+ VIEW COMPARISON:  None. FINDINGS: No evidence of fracture, dislocation, or joint effusion. No evidence of arthropathy or other focal bone abnormality. Soft tissues are unremarkable. IMPRESSION: Negative. Electronically Signed   By: Ulyses Jarred M.D.   On: 12/21/2017 19:23   EKG Interpretation  Date/Time:  Sunday December 21 2017 18:52:15 EST Ventricular Rate:  75 PR Interval:    QRS Duration: 104 QT Interval:  411 QTC Calculation: 460 R Axis:   74 Text Interpretation:  Sinus rhythm Nonspecific T abnormalities, anterior leads Confirmed by Quintella Reichert [phone]) on 12/21/2017 7:09:15 PM   Pending Labs Unresulted Labs (From admission, onward)   None      Vitals/Pain Today's Vitals   12/21/17 2150 12/21/17 2201 12/21/17 2204 12/22/17 0009  BP:   (!) 152/67 (!) 141/55  Pulse: 78 77 72 75  Resp: 17 17 11 17   SpO2: (!) 80% (!) 79% 96% 97%  PainSc:        Isolation Precautions No active isolations  Medications Medications  sodium chloride (PF) 0.9 % injection (has no administration in time range)  iopamidol (ISOVUE-370) 76 % injection 100 mL (100 mLs Intravenous Contrast Given 12/21/17 2036)    Mobility walks with person assist

## 2017-12-22 NOTE — ED Notes (Signed)
Assisted patient to use bedside commode.

## 2017-12-22 NOTE — Progress Notes (Signed)
Pt asked for [facility], referral given to in house rep.

## 2017-12-22 NOTE — Evaluation (Signed)
Physical Therapy Evaluation Patient Details Name: Andrea Kirk MRN: [id number] DOB: 1940-06-02 Today's Date: 12/22/2017   History of Present Illness  Andrea Kirk is a 77 y.o. female with medical history significant for CAD s/p CABG, T2DM, HTN, HLD, Diastolic CHF , Right sided Vestibular Schwannoma, Depression, and Chronic Pain who presents 12/21/17 from her independent living facility by EMS for evaluation of 2 recent falls, hypoxia on RA.   Clinical Impression  The patient is very restless, complains of nausea and weakness. Ambulated in room on RA with sats >94%. BP 188/84. RN aware. Patient does not present strong enough/well enough to return to independent  Living setting based on performance today. Patient should progress to return to independent levelwith time. Pt admitted with above diagnosis. Pt currently with functional limitations due to the deficits listed below (see PT Problem List). Pt will benefit from skilled PT to increase their independence and safety with mobility to allow discharge to the venue listed below.       Follow Up Recommendations Home health PT; vs SNF if gait is unsteady.     Equipment Recommendations  None recommended by PT    Recommendations for Other Services       Precautions / Restrictions Precautions Precautions: Fall Precaution Comments: watch sats      Mobility  Bed Mobility Overal bed mobility: Independent                Transfers Overall transfer level: Needs assistance Equipment used: None Transfers: Sit to/from Stand Sit to Stand: Supervision         General transfer comment: patient relies on hands to steady self  Ambulation/Gait Ambulation/Gait assistance: Min assist Gait Distance (Feet): 20 Feet(x 2) Assistive device: 1 person hand held assist Gait Pattern/deviations: Step-to pattern     General Gait Details: patient holding onto door frame , foot of bed and rails as walked along bed to get to recliner.  Stairs             Wheelchair Mobility    Modified Rankin (Stroke Patients Only)       Balance Overall balance assessment: History of Falls;Needs assistance Sitting-balance support: Feet supported;No upper extremity supported Sitting balance-Leahy Scale: Good     Standing balance support: During functional activity;No upper extremity supported Standing balance-Leahy Scale: Poor Standing balance comment: did not amb far enough to fully test                             Pertinent Vitals/Pain Pain Assessment: No/denies pain    Home Living Family/patient expects to be discharged to:: Private residence Living Arrangements: Alone   Type of Home: Apartment(independent Living) Home Access: Level entry     Home Layout: One level Home Equipment: Walker - 2 wheels;Cane - single point      Prior Function Level of Independence: Independent with assistive device(s)         Comments: drives     Hand Dominance        Extremity/Trunk Assessment   Upper Extremity Assessment Upper Extremity Assessment: Generalized weakness    Lower Extremity Assessment Lower Extremity Assessment: Generalized weakness    Cervical / Trunk Assessment Cervical / Trunk Assessment: Normal  Communication   Communication: No difficulties  Cognition Arousal/Alertness: Awake/alert Behavior During Therapy: WFL for tasks assessed/performed;Restless Overall Cognitive Status: Within Functional Limits for tasks assessed  General Comments      Exercises     Assessment/Plan    PT Assessment Patient needs continued PT services  PT Problem List Decreased strength;Decreased activity tolerance;Decreased mobility;Decreased knowledge of precautions;Decreased safety awareness;Decreased knowledge of use of DME;Cardiopulmonary status limiting activity       PT Treatment Interventions DME instruction;Gait training;Functional mobility  training;Therapeutic activities;Patient/family education    PT Goals (Current goals can be found in the Care Plan section)  Acute Rehab PT Goals Patient Stated Goal: to go home PT Goal Formulation: With patient Time For Goal Achievement: 01/05/18 Potential to Achieve Goals: Good    Frequency Min 2X/week   Barriers to discharge Decreased caregiver support      Co-evaluation               AM-PAC PT "6 Clicks" Mobility  Outcome Measure Help needed turning from your back to your side while in a flat bed without using bedrails?: None Help needed moving from lying on your back to sitting on the side of a flat bed without using bedrails?: None Help needed moving to and from a bed to a chair (including a wheelchair)?: A Lot Help needed standing up from a chair using your arms (e.g., wheelchair or bedside chair)?: A Lot Help needed to walk in hospital room?: A Lot Help needed climbing 3-5 steps with a railing? : Total 6 Click Score: 15    End of Session   Activity Tolerance: Patient limited by fatigue;Treatment limited secondary to agitation(nausea) Patient left: in chair;with call bell/phone within reach;with chair alarm set Nurse Communication: Mobility status PT Visit Diagnosis: Unsteadiness on feet (R26.81)    Time: 1157-2620 PT Time Calculation (min) (ACUTE ONLY): 11 min   Charges:   PT Evaluation $PT Eval Low Complexity: [place] PT Acute Rehabilitation Services Pager [phone] Office [phone]   Claretha Cooper 12/22/2017, 1:15 PM

## 2017-12-22 NOTE — Discharge Summary (Signed)
Discharge Summary  Andrea Kirk CHY:[id number] DOB: [birth year]  PCP: Aura Dials, MD  Admit date: 12/21/2017 Discharge date: 12/22/2017  Time spent: 30 mins  Recommendations for Outpatient Follow-up:  1. PCP in 1 week  Discharge Diagnoses:  Active Hospital Problems   Diagnosis Date Noted  . Hypoxia 12/21/2017  . Depression 12/22/2017  . Chronic pain 12/22/2017  . 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 Falls 12/22/2017  . Chronic diastolic CHF (congestive heart failure) ([place]) 12/22/2017  . Diabetes mellitus ([place]) 05/02/2015  . Essential hypertension, benign 03/11/2013  . Hyperlipidemia 03/11/2013  . Coronary artery disease 03/11/2013    Resolved Hospital Problems  No resolved problems to display.    Discharge Condition: Stable  Diet recommendation: heart healthy/mod carb  Vitals:   12/22/17 0954 12/22/17 1406  BP: (!) 148/73 (!) 155/77  Pulse:  67  Resp:  20  Temp:  (!) 97.3 F (36.3 C)  SpO2:  98%    History of present illness:  Andrea Kirk is a 77 y.o. female with medical history significant for CAD s/p CABG, T2DM, HTN, HLD, Diastolic CHF (EF 87-86% by G1DD by TTE 12/16/17), Right sided Vestibular Schwannoma, Depression, and Chronic Pain who presents from her independent living facility by EMS for evaluation of 2 recent falls.  She is very somnolent initially on exam and is unable to provide much recent history but will arouse and answer simple questions and follow commands with persistent verbal stimulation.  Remainder of history is obtained from ED provider and chart review.  Patient reportedly fell 3 days ago while getting the mail and struck her head. She had a second fall PTA in the bathroom. Pt denies any loss of consciousness or symptoms immediately prior to her falls. Pt has a right-sided vestibular schwannoma and history of frequent falls.  MRI 08/05/2017 showed a stable 4 x 5 mm vestibular schwannoma in the right internal auditory canal.   She has chronic pain and is prescribed Norco 10-325 mg twice daily as needed but on speaking with her, pt states she takes it every 6 hrs prn. In the ED, BP 120/63, pulse 75, RR 15, SPO2 initially mid 70s on room air improved to >92% on 3 L O2 via [place]. CBC and BMP unremarkable, negative i-STAT troponin.  Urinalysis negative for UTI.  D-dimer was obtained and 0.94. Chest x-ray showed prior CABG changes with lower lung volumes, no consolidation or pleural effusion.  Left knee x-ray was negative for fracture, dislocation, or joint effusion.  CT head without contrast was negative for acute infarction, hemorrhage, or mass. CTA chest PE study was negative for evidence of pulmonary embolus, showed bibasilar atelectasis. Due to persistent hypoxia hospitalist service was consulted to admit.    Today, patient reported feeling better, more awake, alert, denies any new complaints, no fever/chills, no chest pain, no shortness of breath, no abdominal pain, nausea/vomiting, dizziness or headaches. Still with left knee pain since after the fall.  Patient currently saturating well on room air, not requiring any oxygen.  Patient ambulated with PT, able to bear weight on the left leg. Patient very eager to be discharged.  Hospital Course:  Principal Problem:   Hypoxia Active Problems:   Coronary artery disease   Essential hypertension, benign   Hyperlipidemia   Diabetes mellitus (H[facility])   Depression   Chronic pain   Unilateral vestibular schwannoma (H[facility])   Falls   Chronic diastolic CHF (congestive heart failure) (H[facility])  Acute hypoxic respiratory failure likely 2/2 ?narcotic  induced Resolved Currently saturating well on room air, at rest and during ambulation Suspect excessive somnolence due to medication (Norco, pt reports taking every 6hrs prn, but was prescribed for every 12 hrs prn. Pt also with atelectasis Chest x-ray without any evidence of pneumonia CTA is negative for pulmonary embolus PCP to evaluate for  OSA Pt advised to take her Norco as prescribed  Follow up with PCP for adequate medication adjustment as pt has chronic pain and has been on pain meds for years  Frequent falls, history of vestibular schwannoma, acute L knee pain Follows with Dr. Constance Holster, ENT, for vestibular schwannoma.  Repeat MRI July 2019 shows stability of right-sided schwannoma.  Suspect this is contributing some to her falls.  No evidence of serious injury on admission from recent falls. CT head negative L knee xray with no fracture (able to bear weight) PT rec HHPT, will d/c on HHPT  Chronic diastolic CHF EF 96-04%, grade 1 diastolic dysfunction by TTE 12/16/2017.  She was started on Lasix last month.  Appears euvolemic on exam.  No dyspnea or acute exacerbation at this time. Continue Lasix, Coreg  CAD s/p CABG: Chest pain free EKG and initial troponin negative. -Continue aspirin and Plavix -Continue Coreg -Continue atorvastatin  Type 2 diabetes: Continue home metformin and glipizide  Hypertension: Continue home amlodipine and Coreg  Hyperlipidemia: Continue atorvastatin  Depression: Continue Celexa  Chronic pain: Follow up with PCP for adequate medication adjustment as pt has chronic pain and has been on pain meds for years  Obesity Advised lifestyle modification        Malnutrition Type:      Malnutrition Characteristics:      Nutrition Interventions:      Estimated body mass index is 31.95 kg/m as calculated from the following:   Height as of this encounter: 5\' 5"  (1.651 m).   Weight as of this encounter: 87.1 kg.    Procedures:  None   Consultations:  None  Discharge Exam: BP (!) 155/77 (BP Location: Left Arm)   Pulse 67   Temp (!) 97.3 F (36.3 C)   Resp 20   Ht 5\' 5"  (1.651 m)   Wt 87.1 kg   SpO2 98%   BMI 31.95 kg/m   General: NAD Cardiovascular:  S1, S2 present Respiratory: CTAB  Discharge Instructions You were cared for by a hospitalist  during your hospital stay. If you have any questions about your discharge medications or the care you received while you were in the hospital after you are discharged, you can call the unit and asked to speak with the hospitalist on call if the hospitalist that took care of you is not available. Once you are discharged, your primary care physician will handle any further medical issues. Please note that NO REFILLS for any discharge medications will be authorized once you are discharged, as it is imperative that you return to your primary care physician (or establish a relationship with a primary care physician if you do not have one) for your aftercare needs so that they can reassess your need for medications and monitor your lab values.   Allergies as of 12/22/2017      Reactions   Codeine Other (See Comments)   Abnormal behavior   Latex Other (See Comments)   tears skin   Morphine And Related Other (See Comments)   Affects BP and blood sugar.   Cyclobenzaprine    MADE PT SICK- pt unsure if med was cyclobenzaprine or methocarbamol  Methocarbamol Other (See Comments)   MADE PT SICK- pt unsure if med was cyclobenzaprine or methocarbamol       Medication List    STOP taking these medications   predniSONE 20 MG tablet Commonly known as:  DELTASONE     TAKE these medications   acetaminophen 325 MG tablet Commonly known as:  TYLENOL Take 650 mg by mouth every 6 (six) hours as needed for moderate pain.   amLODipine 5 MG tablet Commonly known as:  NORVASC Take 1 tablet (5 mg total) by mouth daily.   aspirin EC 81 MG tablet Take 81 mg by mouth every evening.   atorvastatin 40 MG tablet Commonly known as:  LIPITOR Take 40 mg by mouth daily at 6 PM.   B-12 2500 MCG Tabs Take 2,500 mcg by mouth daily.   CALTRATE 600+D PO Take 1 tablet by mouth daily.   carvedilol 25 MG tablet Commonly known as:  COREG TAKE 1 TABLET BY MOUTH TWO  TIMES DAILY   citalopram 20 MG tablet Commonly  known as:  CELEXA Take 20 mg by mouth every morning.   clopidogrel 75 MG tablet Commonly known as:  PLAVIX Take 75 mg by mouth every morning.   FERROUS SULFATE PO Take 325 mg by mouth daily.   fish oil-omega-3 fatty acids 1000 MG capsule Take 1 g by mouth daily.   furosemide 20 MG tablet Commonly known as:  LASIX Take 20 mg by mouth daily as needed for fluid or edema.   gabapentin 300 MG capsule Commonly known as:  NEURONTIN Take 300 mg by mouth 4 (four) times daily.   glipiZIDE 10 MG 24 hr tablet Commonly known as:  GLUCOTROL XL Take 10 mg by mouth daily with breakfast.   HYDROcodone-acetaminophen 10-325 MG tablet Commonly known as:  NORCO Take 1 tablet by mouth 2 (two) times daily as needed for severe pain.   ibuprofen 400 MG tablet Commonly known as:  ADVIL,MOTRIN Take 400 mg by mouth every 6 (six) hours as needed.   meclizine 25 MG tablet Commonly known as:  ANTIVERT Take 25 mg by mouth 2 (two) times daily as needed for dizziness. For dizziness   metFORMIN 1000 MG tablet Commonly known as:  GLUCOPHAGE Take 1,000 mg by mouth 2 (two) times daily with a meal.   methocarbamol 500 MG tablet Commonly known as:  ROBAXIN Take 1 tablet (500 mg total) by mouth every 8 (eight) hours as needed for muscle spasms.   nitroGLYCERIN 0.4 MG SL tablet Commonly known as:  NITROSTAT Place 1 tablet (0.4 mg total) under the tongue every 5 (five) minutes as needed. Chest pain. What changed:    reasons to take this  additional instructions   pantoprazole 40 MG tablet Commonly known as:  PROTONIX Take 40 mg by mouth daily as needed (INDIGESTION).   promethazine 12.5 MG tablet Commonly known as:  PHENERGAN Take 12.5 mg by mouth every 6 (six) hours as needed for nausea or vomiting.   tamsulosin 0.4 MG Caps capsule Commonly known as:  FLOMAX Take 0.4 mg by mouth daily.   vitamin C 1000 MG tablet Take 1,000 mg by mouth daily.      Allergies  Allergen Reactions  .  Codeine Other (See Comments)    Abnormal behavior  . Latex Other (See Comments)    tears skin  . Morphine And Related Other (See Comments)    Affects BP and blood sugar.  . Cyclobenzaprine     MADE PT SICK-  pt unsure if med was cyclobenzaprine or methocarbamol   . Methocarbamol Other (See Comments)    MADE PT SICK- pt unsure if med was cyclobenzaprine or methocarbamol    Follow-up Information    Aura Dials, MD. Schedule an appointment as soon as possible for a visit in 1 week(s).   Specialty:  Family Medicine Contact information: [address] [place] [address] [phone]            The results of significant diagnostics from this hospitalization (including imaging, microbiology, ancillary and laboratory) are listed below for reference.    Significant Diagnostic Studies: Ct Head Wo Contrast  Result Date: 12/21/2017 CLINICAL DATA:  Fall. EXAM: CT HEAD WITHOUT CONTRAST TECHNIQUE: Contiguous axial images were obtained from the base of the skull through the vertex without intravenous contrast. COMPARISON:  MR brain dated August 05, 2017. FINDINGS: Brain: No evidence of acute infarction, hemorrhage, hydrocephalus, extra-axial collection or mass lesion/mass effect. Stable mild age related cerebral atrophy. Vascular: Calcified atherosclerosis at the skullbase. No hyperdense vessel. Skull: Negative for fracture or focal lesion. Sinuses/Orbits: No acute finding. Other: None. IMPRESSION: 1.  No acute intracranial abnormality. Electronically Signed   By: Titus Dubin M.D.   On: 12/21/2017 19:50   Ct Angio Chest Pe W/cm &/or Wo Cm  Result Date: 12/21/2017 CLINICAL DATA:  Fall, chest wall pain. EXAM: CT ANGIOGRAPHY CHEST WITH CONTRAST TECHNIQUE: Multidetector CT imaging of the chest was performed using the standard protocol during bolus administration of intravenous contrast. Multiplanar CT image reconstructions and MIPs were obtained to evaluate the vascular anatomy.  CONTRAST:  187mL ISOVUE-370 IOPAMIDOL (ISOVUE-370) INJECTION 76% COMPARISON:  Chest CT 04/29/2017.  Chest x-ray earlier today. FINDINGS: Cardiovascular: Prior CABG. Cardiomegaly. Aortic atherosclerosis. No evidence of aortic aneurysm. No filling defects in the pulmonary arteries to suggest pulmonary emboli. Mediastinum/Nodes: No mediastinal, hilar, or axillary adenopathy. Lungs/Pleura: Linear areas of atelectasis in the lower lobes bilaterally. Bibasilar atelectasis. No effusions or confluent airspace opacities. Upper Abdomen: Imaging into the upper abdomen shows no acute findings. Musculoskeletal: Chest wall soft tissues are unremarkable. No acute bony abnormality. Review of the MIP images confirms the above findings. IMPRESSION: Cardiomegaly.  Bibasilar atelectasis. Prior CABG. No evidence of pulmonary embolus. Aortic Atherosclerosis (ICD10-I70.0). Electronically Signed   By: Rolm Baptise M.D.   On: 12/21/2017 20:58   Dg Chest Port 1 View  Result Date: 12/21/2017 CLINICAL DATA:  77 year old female status post fall. Chest wall pain. Hypoxia. EXAM: PORTABLE CHEST 1 VIEW COMPARISON:  10/27/2017 chest radiographs and earlier. FINDINGS: Portable AP semi upright view at 1853 hours. Lower lung volumes. Stable cardiomegaly and mediastinal contours. Prior CABG. Stable pulmonary vascular congestion or increased interstitial markings from earlier this year. No pneumothorax, pleural effusion or confluent pulmonary opacity. No acute osseous abnormality identified. IMPRESSION: No acute cardiopulmonary abnormality or acute traumatic injury identified. Electronically Signed   By: Genevie Ann M.D.   On: 12/21/2017 19:18   Dg Knee Complete 4 Views Left  Result Date: 12/21/2017 CLINICAL DATA:  Fall with knee pain EXAM: LEFT KNEE - COMPLETE 4+ VIEW COMPARISON:  None. FINDINGS: No evidence of fracture, dislocation, or joint effusion. No evidence of arthropathy or other focal bone abnormality. Soft tissues are unremarkable.  IMPRESSION: Negative. Electronically Signed   By: Ulyses Jarred M.D.   On: 12/21/2017 19:23    Microbiology: Recent Results (from the past 240 hour(s))  MRSA PCR Screening     Status: Abnormal   Collection Time: 12/22/17  1:15 AM  Result Value Ref Range Status   MRSA by PCR POSITIVE (A) NEGATIVE Final    Comment:        The GeneXpert MRSA Assay (FDA approved for NASAL specimens only), is one component of a comprehensive MRSA colonization surveillance program. It is not intended to diagnose MRSA infection nor to guide or monitor treatment for MRSA infections. RESULT CALLED TO, READ BACK BY AND VERIFIED WITH: CAUDLE,EMILY RN AT 2878 12/22/17 BY TIBBITTS,K Performed at [facility], [place] [address] [place], [place] [address]      Labs: Basic Metabolic Panel: Recent Labs  Lab 12/21/17 1822 12/21/17 1906 12/22/17 0158  NA 136 136 140  K 4.9 4.9 4.8  CL 98 99 101  CO2 27  --  26  GLUCOSE 150* 142* 183*  BUN 36* 45* 30*  CREATININE 1.10* 1.10* 0.86  CALCIUM 9.5  --  9.3   Liver Function Tests: Recent Labs  Lab 12/21/17 1822  AST 35  ALT 17  ALKPHOS 64  BILITOT 1.1  PROT 7.6  ALBUMIN 4.2   Recent Labs  Lab 12/21/17 1822  LIPASE 51   Recent Labs  Lab 12/22/17 0158  AMMONIA 34   CBC: Recent Labs  Lab 12/21/17 1822 12/21/17 1906 12/22/17 0548  WBC 7.7  --  6.0  NEUTROABS 4.6  --   --   HGB 12.3 13.6 11.2*  HCT 39.0 40.0 35.0*  MCV 107.4*  --  106.4*  PLT 174  --  161   Cardiac Enzymes: No results for input(s): CKTOTAL, CKMB, CKMBINDEX, TROPONINI in the last 168 hours. BNP: BNP (last 3 results) No results for input(s): BNP in the last 8760 hours.  ProBNP (last 3 results) No results for input(s): PROBNP in the last 8760 hours.  CBG: Recent Labs  Lab 12/22/17 0054 12/22/17 0758 12/22/17 1221  GLUCAP 110* 128* 150*       Signed:  Alma Friendly, MD [facility] 12/22/2017, 2:40 PM

## 2017-12-22 NOTE — Care Management Obs Status (Signed)
[place] NOTIFICATION   Patient Details  Name: Andrea Kirk MRN: [id number] Date of Birth: Aug 22, 1940   Medicare Observation Status Notification Given:    yes   Leeroy Cha, RN 12/22/2017, 10:31 AM

## 2018-09-08 ENCOUNTER — Other Ambulatory Visit: Payer: Self-pay | Admitting: Otolaryngology

## 2018-09-08 DIAGNOSIS — D333 Benign neoplasm of cranial nerves: Secondary | ICD-10-CM

## 2018-11-05 ENCOUNTER — Other Ambulatory Visit: Payer: Medicare Other

## 2018-11-26 ENCOUNTER — Other Ambulatory Visit: Payer: Self-pay

## 2018-11-26 DIAGNOSIS — I6523 Occlusion and stenosis of bilateral carotid arteries: Secondary | ICD-10-CM

## 2018-11-30 ENCOUNTER — Encounter: Payer: Self-pay | Admitting: Family

## 2018-11-30 ENCOUNTER — Ambulatory Visit (INDEPENDENT_AMBULATORY_CARE_PROVIDER_SITE_OTHER): Payer: Medicare Other | Admitting: Family

## 2018-11-30 ENCOUNTER — Other Ambulatory Visit: Payer: Self-pay

## 2018-11-30 ENCOUNTER — Ambulatory Visit (HOSPITAL_COMMUNITY)
Admission: RE | Admit: 2018-11-30 | Discharge: 2018-11-30 | Disposition: A | Discharge status: Home / Self Care | Payer: Medicare Other | Source: Ambulatory Visit | Attending: Family | Admitting: Family

## 2018-11-30 VITALS — BP 128/76 | HR 65 | Temp 97.8°F | Resp 20 | Ht 65.0 in | Wt 173.8 lb

## 2018-11-30 DIAGNOSIS — I6523 Occlusion and stenosis of bilateral carotid arteries: Secondary | ICD-10-CM | POA: Diagnosis present

## 2018-11-30 DIAGNOSIS — Z9889 Other specified postprocedural states: Secondary | ICD-10-CM | POA: Diagnosis not present

## 2018-11-30 NOTE — Patient Instructions (Signed)

## 2018-11-30 NOTE — Progress Notes (Signed)
Chief Complaint: Follow up Extracranial Carotid Artery Stenosis   History of Present Illness  Andrea Kirk is a 78 y.o. female who is s/p left CEA on 04/16/13 by Dr. Donnetta Hutching. She also has a history of right CEA in 2018.  She returns today for follow up.   Dr. Donnetta Hutching last evaluated pt on 05-23-17. At that time, based on carotid duplex that day, patient had widely patent bilateral internal carotid artery surgical sites without evidence of any restenosis. Duplex also demonstrated patent and antegrade vertebral arteries bilaterally without evidence of subclavian steal Dr. Donnetta Hutching advised continue aspirin and statin regimen daily, follow regularly with PCP for management of chronic medical conditions, and recheck carotid duplex in 1 year per protocol.  She denies any history of TIA or stroke symptoms.Specifically she denies a history of amaurosis fugax or monocular blindness, unilateral facial drooping, hemiparesis, or receptive or expressive aphasia.   She had back surgery in 2013 and has current back issues, she is a retired Scientist, water quality. She attends a pain management clinic.  She has radiculopathy pain in her right hip and leg.   Diabetic: Yes, states her last A1C was possibly 6.8 Tobacco use: non-smoker, but was sometimes exposed to secondhand smoke  Pt meds include: Statin : Yes ASA: Yes Other anticoagulants/antiplatelets: Plavix   Past Medical History:  Diagnosis Date  . Acute kidney injury ([facility]) 08/05/2016  . Aftercare following surgery of the circulatory system, [place] 05/11/2013  . AKI (acute kidney injury) ([place]) 08/04/2016  . Anxiety    takes Celexa daily  . ARF (acute renal failure) ([place]) 11/07/2016  . Asymptomatic stenosis of right carotid artery 03/22/2016  . Back pain    occasionally  . Carotid artery disease ([place]) 04/16/2013  . Carotid artery occlusion   . Carotid stenosis 11/16/2013  . Cataract    left and immature  . Coronary artery disease   . Coronary atherosclerosis of  native coronary artery 03/11/2013   S/p CABG in 1997   . Depression   . Diabetes mellitus    takes Metformin and Glipizide daily  . Diabetes mellitus ([place]) 05/02/2015  . Dizziness    takes Meclizine daily as needed  . Essential hypertension, benign 03/11/2013  . GERD (gastroesophageal reflux disease)    takes Omeprazole daily as needed  . Headache(784.0)   . Hyperlipidemia    takes Atorvastatin daily  . Hypertension    takes Carvedilol daily  . Mixed hyperlipidemia 03/11/2013  . Muscle spasm    takes Robaxin daily as needed  . Nausea    takes Phenergan daily as needed  . Occlusion and stenosis of carotid artery without mention of cerebral infarction 07/19/2011  . Pneumonia    hx of-in high school  . Restless leg    takes Requip daily as needed  . Seasonal allergies    takes Claritin daily as needed and Afrin as needed  . Shortness of breath    with exertion  . Urinary urgency   . UTI (urinary tract infection) 11/07/2016    Social History Social History   Tobacco Use  . Smoking status: Never Smoker  . Smokeless tobacco: Never Used  Substance Use Topics  . Alcohol use: No    Alcohol/week: 0.0 standard drinks  . Drug use: No    Family History Family History  Problem Relation Age of Onset  . Heart attack Father   . Heart disease Father 39       Before age of 15  . Hyperlipidemia  Father   . Heart disease Brother        Heart dissease before age 15  . Hyperlipidemia Brother   . Cirrhosis Mother   . Heart attack Paternal Grandfather   . Heart disease Paternal Grandfather   . Cancer Maternal Grandmother   . Alcoholism Maternal Grandfather   . Heart attack Paternal Grandmother     Surgical History Past Surgical History:  Procedure Laterality Date  . COLONOSCOPY WITH PROPOFOL N/A 03/10/2012   Procedure: COLONOSCOPY WITH PROPOFOL;  Surgeon: Garlan Fair, MD;  [facility]: [facility];  Service: Endoscopy;  Laterality: N/A;  . CORNEAL TRANSPLANT Right   . CORONARY  ARTERY BYPASS GRAFT  1997   x 6  . CORONARY ARTERY BYPASS GRAFT  Jan. 1997  . ENDARTERECTOMY Left 04/16/2013   Procedure: Left Carotid Artery Endatarectomy with Resection of Redundant Internal Carotid Artery;  Surgeon: Rosetta Posner, MD;  Location: [facility];  Service: Vascular;  Laterality: Left;  . ENDARTERECTOMY Right 03/22/2016   Procedure: RIGHT ENDARTERECTOMY CAROTID;  Surgeon: Rosetta Posner, MD;  Location[facility];  Service: Vascular;  Laterality: Right;  . ESOPHAGOGASTRODUODENOSCOPY N/A 03/10/2012   Procedure: ESOPHAGOGASTRODUODENOSCOPY (EGD);  Surgeon: Garlan Fair, MD;  [facility];  Service: Endoscopy;  Laterality: N/A;  . EYE SURGERY  March 12, 2001   CORNEA TRANSPLANT Right eye  . PATCH ANGIOPLASTY Right 03/22/2016   Procedure: PATCH ANGIOPLASTY;  Surgeon: Rosetta Posner, MD;  Location: [facility];  Service: Vascular;  Laterality: Right;  . PR VEIN BYPASS GRAFT,AORTO-FEM-POP  1997  . SPINE SURGERY  march 2013   Back surgery  . TONSILLECTOMY    . TRIGGER FINGER RELEASE Left    thumb    Allergies  Allergen Reactions  . Codeine Other (See Comments)    Abnormal behavior  . Latex Other (See Comments)    tears skin  . Morphine And Related Other (See Comments)    Affects BP and blood sugar.  . Cyclobenzaprine     MADE PT SICK- pt unsure if med was cyclobenzaprine or methocarbamol   . Methocarbamol Other (See Comments)    MADE PT SICK- pt unsure if med was cyclobenzaprine or methocarbamol     Current Outpatient Medications  Medication Sig Dispense Refill  . acetaminophen (TYLENOL) 325 MG tablet Take 650 mg by mouth every 6 (six) hours as needed for moderate pain.    Marland Kitchen amLODipine (NORVASC) 5 MG tablet Take 1 tablet (5 mg total) by mouth daily. 30 tablet 0  . Ascorbic Acid (VITAMIN C) 1000 MG tablet Take 1,000 mg by mouth daily.    Marland Kitchen aspirin EC 81 MG tablet Take 81 mg by mouth every evening.     Marland Kitchen atorvastatin (LIPITOR) 40 MG tablet Take 40 mg by mouth daily at 6 PM.     .  Calcium Carbonate-Vitamin D (CALTRATE 600+D PO) Take 1 tablet by mouth daily.    . carvedilol (COREG) 25 MG tablet TAKE 1 TABLET BY MOUTH TWO  TIMES DAILY 180 tablet 3  . cholecalciferol (VITAMIN D3) 25 MCG (1000 UT) tablet Take 1,000 Units by mouth daily.    . citalopram (CELEXA) 20 MG tablet Take 20 mg by mouth every morning.     . clopidogrel (PLAVIX) 75 MG tablet Take 75 mg by mouth every morning.     . Cyanocobalamin (B-12) 2500 MCG TABS Take 2,500 mcg by mouth daily.    Marland Kitchen FERROUS SULFATE PO Take 325 mg by mouth daily.    Marland Kitchen  fish oil-omega-3 fatty acids 1000 MG capsule Take 1 g by mouth daily.    . furosemide (LASIX) 20 MG tablet Take 20 mg by mouth daily as needed for fluid or edema.     . gabapentin (NEURONTIN) 300 MG capsule Take 300 mg by mouth 4 (four) times daily.     Marland Kitchen glipiZIDE (GLUCOTROL XL) 10 MG 24 hr tablet Take 10 mg by mouth daily with breakfast.     . HYDROcodone-acetaminophen (NORCO) 10-325 MG tablet Take 1 tablet by mouth 2 (two) times daily as needed for severe pain.     Marland Kitchen ibuprofen (ADVIL,MOTRIN) 400 MG tablet Take 400 mg by mouth every 6 (six) hours as needed.    . meclizine (ANTIVERT) 25 MG tablet Take 25 mg by mouth 2 (two) times daily as needed for dizziness. For dizziness     . metFORMIN (GLUCOPHAGE) 1000 MG tablet Take 1,000 mg by mouth 2 (two) times daily with a meal.     . methocarbamol (ROBAXIN) 500 MG tablet Take 1 tablet (500 mg total) by mouth every 8 (eight) hours as needed for muscle spasms. 30 tablet 0  . nitroGLYCERIN (NITROSTAT) 0.4 MG SL tablet Place 1 tablet (0.4 mg total) under the tongue every 5 (five) minutes as needed. Chest pain. (Patient taking differently: Place 0.4 mg under the tongue every 5 (five) minutes as needed for chest pain. ) 25 tablet 5  . pantoprazole (PROTONIX) 40 MG tablet Take 40 mg by mouth daily as needed (INDIGESTION).     Marland Kitchen promethazine (PHENERGAN) 12.5 MG tablet Take 12.5 mg by mouth every 6 (six) hours as needed for nausea or  vomiting.     No current facility-administered medications for this visit.     Review of Systems : See HPI for pertinent positives and negatives.  Physical Examination  Vitals:   11/30/18 1112 11/30/18 1115  BP: 116/76 128/76  Pulse: 65   Resp: 20   Temp: 97.8 F (36.6 C)   SpO2: 93%   Weight: 173 lb 12.8 oz (78.8 kg)   Height: 5\' 5"  (1.651 m)    Body mass index is 28.92 kg/m.  General: WDWN female in NAD GAIT: slow, steady, using cane Eyes: Pupils are equal and round HENT: No gross abnormalities.  Pulmonary:  Respirations are non-labored, good air movement in all fields, CTAB, no rales, rhonchi, or wheezes Cardiac: regular rhythm, no detected murmur.  VASCULAR EXAM Carotid Bruits Right Left   Negative Negative     Abdominal aortic pulse is not palpable. Radial pulses are 2+ palpable and equal.                                                                                                              Gastrointestinal: soft, nontender, BS WNL, no r/g, no palpable masses. Musculoskeletal: no muscle atrophy/wasting. M/S 4/5 in upper extremities, 3/5 in right LE, 4/5 in left LE, extremities without ischemic changes Skin: No rashes, no ulcers, no cellulitis.   Neurologic:  A&O X 3; appropriate affect, sensation is normal;  speech is somewhat slow, CN 2-12 intact, pain and light touch intact in extremities, motor exam as listed above. Psychiatric: Normal thought content, mood appropriate to clinical situation.    DATA Carotid Duplex (11-30-18): Right Carotid: Patent right carotid endarterectomy site with no evidence for                restenosis. The ECA appears >50% stenosed. Left Carotid: Patent left carotid endarterectomy site with 40 - 59% stenosis in               the proximal ICA. The ECA appears >50% stenosed. Bilateral vertebral arteries are antegrade. Bilateral subclavian artery waveforms are multiphasic.    Assessment: Stpehanie Andert Shiveley is a 78 y.o. female who  is s/p left CEA on 04/16/13 and right CE in 2018 by Dr. Donnetta Hutching.  She has no history of stroke or TIA.  Today's carotid duplex shows no restenosis in the right ICA CEA site and 40-59% restenosis in the left ICA CEA site.   Her atherosclerotic risk factors include currently in control DM and exposure to secondhand smoke in the past.  She takes a statin, Plavix, and ASA daily.     Plan: Follow-up in 1 year with Carotid Duplex scan.   I discussed in depth with the patient the nature of atherosclerosis, and emphasized the importance of maximal medical management including strict control of blood pressure, blood glucose, and lipid levels, obtaining regular exercise, and continued cessation of smoking.  The patient is aware that without maximal medical management the underlying atherosclerotic disease process will progress, limiting the benefit of any interventions. The patient was given information about stroke prevention and what symptoms should prompt the patient to seek immediate medical care. Thank you for allowing [place] to participate in this patient's care.  Clemon Chambers, RN, MSN, FNP-C [facility] Office: [phone]  Clinic Physician: Early on call  11/30/18 11:31 AM

## 2018-12-01 ENCOUNTER — Ambulatory Visit
Admission: RE | Admit: 2018-12-01 | Discharge: 2018-12-01 | Disposition: A | Discharge status: Home / Self Care | Payer: Medicare Other | Source: Ambulatory Visit | Attending: Otolaryngology | Admitting: Otolaryngology

## 2018-12-01 DIAGNOSIS — D333 Benign neoplasm of cranial nerves: Secondary | ICD-10-CM

## 2018-12-01 MED ORDER — GADOBENATE DIMEGLUMINE 529 MG/ML IV SOLN
15.0000 mL | Freq: Once | INTRAVENOUS | Status: AC | PRN
  Administered 2018-12-01: 15 mL via INTRAVENOUS

## 2019-01-17 NOTE — Progress Notes (Signed)
Cardiology Office Note   Date:  01/18/2019   ID:  Andrea Kirk, DOB 1940/08/09, MRN [id number]  PCP:  Aura Dials, MD    No chief complaint on file.  CAD  Wt Readings from Last 3 Encounters:  01/18/19 171 lb 12.8 oz (77.9 kg)  11/30/18 173 lb 12.8 oz (78.8 kg)  12/22/17 192 lb (87.1 kg)       History of Present Illness: Andrea Kirk is a 79 y.o. female  who had CABG in 1997. She suffered a fall in 2014 due to her knee giving way. She did not pass out at that time. She had significant bruising on her face at that time.   She worked at [employer] on [address]. She works only between 7A-7P for 4-5 hours at a time due to leg problems.   She had a right CEA in 2018. She had several falls after the surgery. She has broken her right wrist.   She subsequently broke her collar bone but did not require surgery.   In 10/19, she had a UTI.  She then was on an antibiotic and had leg swelling.  She was started on Lasix. Her weight was up 14 lbs at that time.  It came down with diuresis.    She moved to a senior community in 11/19, to [place].  She had some swelling before the move as well.  It resolved with Lasix.    Since the last visit, a few office staff at her facility had [place].  Otherwise, residents have been healthy.   Denies : Chest pain. Dizziness. Leg edema. Nitroglycerin use. Orthopnea. Palpitations. Paroxysmal nocturnal dyspnea. Shortness of breath. Syncope.   She has not had to use Lasix frequently.    Losing weight through diet.  Walking regularly.      Past Medical History:  Diagnosis Date  . Acute kidney injury ([place]) 08/05/2016  . Aftercare following surgery of the circulatory system, [place] 05/11/2013  . AKI (acute kidney injury) ([place]) 08/04/2016  . Anxiety    takes Celexa daily  . ARF (acute renal failure) ([place]) 11/07/2016  . Asymptomatic stenosis of right carotid artery 03/22/2016  . Back pain    occasionally  . Carotid artery disease  ([place]) 04/16/2013  . Carotid artery occlusion   . Carotid stenosis 11/16/2013  . Cataract    left and immature  . Coronary artery disease   . Coronary atherosclerosis of native coronary artery 03/11/2013   S/p CABG in 1997   . Depression   . Diabetes mellitus    takes Metformin and Glipizide daily  . Diabetes mellitus ([place]) 05/02/2015  . Dizziness    takes Meclizine daily as needed  . Essential hypertension, benign 03/11/2013  . GERD (gastroesophageal reflux disease)    takes Omeprazole daily as needed  . Headache(784.0)   . Hyperlipidemia    takes Atorvastatin daily  . Hypertension    takes Carvedilol daily  . Mixed hyperlipidemia 03/11/2013  . Muscle spasm    takes Robaxin daily as needed  . Nausea    takes Phenergan daily as needed  . Occlusion and stenosis of carotid artery without mention of cerebral infarction 07/19/2011  . Pneumonia    hx of-in high school  . Restless leg    takes Requip daily as needed  . Seasonal allergies    takes Claritin daily as needed and Afrin as needed  . Shortness of breath    with exertion  . Urinary urgency   .  UTI (urinary tract infection) 11/07/2016    Past Surgical History:  Procedure Laterality Date  . COLONOSCOPY WITH PROPOFOL N/A 03/10/2012   Procedure: COLONOSCOPY WITH PROPOFOL;  Surgeon: Garlan Fair, MD;  [facility];  Service: Endoscopy;  Laterality: N/A;  . CORNEAL TRANSPLANT Right   . CORONARY ARTERY BYPASS GRAFT  1997   x 6  . CORONARY ARTERY BYPASS GRAFT  Jan. 1997  . ENDARTERECTOMY Left 04/16/2013   Procedure: Left Carotid Artery Endatarectomy with Resection of Redundant Internal Carotid Artery;  Surgeon: Rosetta Posner, MD;  Location: [facility];  Service: Vascular;  Laterality: Left;  . ENDARTERECTOMY Right 03/22/2016   Procedure: RIGHT ENDARTERECTOMY CAROTID;  Surgeon: Rosetta Posner, MD;  Location: [facility] OR;  Service: Vascular;  Laterality: Right;  . ESOPHAGOGASTRODUODENOSCOPY N/A 03/10/2012   Procedure:  ESOPHAGOGASTRODUODENOSCOPY (EGD);  Surgeon: Garlan Fair, MD;  [facility];  Service: Endoscopy;  Laterality: N/A;  . EYE SURGERY  March 12, 2001   CORNEA TRANSPLANT Right eye  . PATCH ANGIOPLASTY Right 03/22/2016   Procedure: PATCH ANGIOPLASTY;  Surgeon: Rosetta Posner, MD;  Location: [facility];  Service: Vascular;  Laterality: Right;  . PR VEIN BYPASS GRAFT,AORTO-FEM-POP  1997  . SPINE SURGERY  march 2013   Back surgery  . TONSILLECTOMY    . TRIGGER FINGER RELEASE Left    thumb     Current Outpatient Medications  Medication Sig Dispense Refill  . acetaminophen (TYLENOL) 325 MG tablet Take 650 mg by mouth every 6 (six) hours as needed for moderate pain.    Marland Kitchen amLODipine (NORVASC) 5 MG tablet Take 1 tablet (5 mg total) by mouth daily. 30 tablet 0  . Ascorbic Acid (VITAMIN C) 1000 MG tablet Take 1,000 mg by mouth daily.    Marland Kitchen aspirin EC 81 MG tablet Take 81 mg by mouth every evening.     Marland Kitchen atorvastatin (LIPITOR) 40 MG tablet Take 40 mg by mouth daily at 6 PM.     . Calcium Carbonate-Vitamin D (CALTRATE 600+D PO) Take 1 tablet by mouth daily.    . carvedilol (COREG) 25 MG tablet TAKE 1 TABLET BY MOUTH TWO  TIMES DAILY 180 tablet 3  . cholecalciferol (VITAMIN D3) 25 MCG (1000 UT) tablet Take 1,000 Units by mouth daily.    . citalopram (CELEXA) 20 MG tablet Take 20 mg by mouth every morning.     . clopidogrel (PLAVIX) 75 MG tablet Take 75 mg by mouth every morning.     . Cyanocobalamin (B-12) 2500 MCG TABS Take 2,500 mcg by mouth daily.    Marland Kitchen FERROUS SULFATE PO Take 325 mg by mouth daily.    . fish oil-omega-3 fatty acids 1000 MG capsule Take 1 g by mouth daily.    . furosemide (LASIX) 20 MG tablet Take 20 mg by mouth daily as needed for fluid or edema.     . gabapentin (NEURONTIN) 300 MG capsule Take 300 mg by mouth 4 (four) times daily.     Marland Kitchen glipiZIDE (GLUCOTROL XL) 10 MG 24 hr tablet Take 10 mg by mouth daily with breakfast.     . HYDROcodone-acetaminophen (NORCO) 10-325 MG tablet  Take 1 tablet by mouth 2 (two) times daily as needed for severe pain.     Marland Kitchen ibuprofen (ADVIL,MOTRIN) 400 MG tablet Take 400 mg by mouth every 6 (six) hours as needed.    . meclizine (ANTIVERT) 25 MG tablet Take 25 mg by mouth 2 (two) times daily as needed for  dizziness. For dizziness     . metFORMIN (GLUCOPHAGE) 1000 MG tablet Take 1,000 mg by mouth 2 (two) times daily with a meal.     . methocarbamol (ROBAXIN) 500 MG tablet Take 1 tablet (500 mg total) by mouth every 8 (eight) hours as needed for muscle spasms. 30 tablet 0  . nitroGLYCERIN (NITROSTAT) 0.4 MG SL tablet Place 1 tablet (0.4 mg total) under the tongue every 5 (five) minutes as needed. Chest pain. (Patient taking differently: Place 0.4 mg under the tongue every 5 (five) minutes as needed for chest pain. ) 25 tablet 5  . pantoprazole (PROTONIX) 40 MG tablet Take 40 mg by mouth daily as needed (INDIGESTION).     Marland Kitchen promethazine (PHENERGAN) 12.5 MG tablet Take 12.5 mg by mouth every 6 (six) hours as needed for nausea or vomiting.     No current facility-administered medications for this visit.    Allergies:   Codeine, Latex, Morphine and related, Cyclobenzaprine, and Methocarbamol    Social History:  The patient  reports that she has never smoked. She has never used smokeless tobacco. She reports that she does not drink alcohol or use drugs.   Family History:  The patient's family history includes Alcoholism in her maternal grandfather; Cancer in her maternal grandmother; Cirrhosis in her mother; Heart attack in her father, paternal grandfather, and paternal grandmother; Heart disease in her brother and paternal grandfather; Heart disease (age of onset: 5) in her father; Hyperlipidemia in her brother and father.    ROS:  Please see the history of present illness.   Otherwise, review of systems are positive for maintaining weight loss.   All other systems are reviewed and negative.    PHYSICAL EXAM: VS:  BP 138/74   Pulse 70   Ht  5\' 5"  (1.651 m)   Wt 171 lb 12.8 oz (77.9 kg)   SpO2 94%   BMI 28.59 kg/m  , BMI Body mass index is 28.59 kg/m. GEN: Well nourished, well developed, in no acute distress  HEENT: normal  Neck: no JVD, carotid bruits, or masses Cardiac: RRR; no murmurs, rubs, or gallops,no edema  Respiratory:  clear to auscultation bilaterally, normal work of breathing GI: soft, nontender, nondistended, + BS MS: no deformity or atrophy  Skin: warm and dry, no rash Neuro:  Strength and sensation are intact Psych: euthymic mood, full affect   EKG:   The ekg ordered today demonstrates NSR, no ST changes   Recent Labs: No results found for requested labs within last 8760 hours.   Lipid Panel No results found for: CHOL, TRIG, HDL, CHOLHDL, VLDL, LDLCALC, LDLDIRECT   Other studies Reviewed: Additional studies/ records that were reviewed today with results demonstrating: lab results reviewed.   ASSESSMENT AND PLAN:  1. CAD: No angina.  COntinue aggressive secondary prevention. Refill SL NTG. COVID vaccine recommended.  Phone number to register also given. 2. HTN: The current medical regimen is effective;  continue present plan and medications. 3. Hyperlipidemia: LDL 50 in 11/2018.  Continue statin.  4. Type 2 DM: A1C 6.8 in 11/2018.  Continue healthy diet.  Continue regular walking. 5. Chronic diastolic heart failure: Appears euvolemic.   6. Carotid artery disease: s/p CEA.  Moderate left sided disease in 11/2018. Patent CEA on the right.      Current medicines are reviewed at length with the patient today.  The patient concerns regarding her medicines were addressed.  The following changes have been made:  No change   Labs/  tests ordered today include:  No orders of the defined types were placed in this encounter.   Recommend 150 minutes/week of aerobic exercise Low fat, low carb, high fiber diet recommended  Disposition:   FU in 1 year   Signed, Larae Grooms, MD  01/18/2019  10:10 AM    [facility] [place], [place], [place]  [address] Phone: [phone]; Fax: [phone]

## 2019-01-18 ENCOUNTER — Encounter: Payer: Self-pay | Admitting: Interventional Cardiology

## 2019-01-18 ENCOUNTER — Other Ambulatory Visit: Payer: Self-pay

## 2019-01-18 ENCOUNTER — Ambulatory Visit (INDEPENDENT_AMBULATORY_CARE_PROVIDER_SITE_OTHER): Payer: Medicare Other | Admitting: Interventional Cardiology

## 2019-01-18 VITALS — BP 138/74 | HR 70 | Ht 65.0 in | Wt 171.8 lb

## 2019-01-18 DIAGNOSIS — I779 Disorder of arteries and arterioles, unspecified: Secondary | ICD-10-CM

## 2019-01-18 DIAGNOSIS — I25119 Atherosclerotic heart disease of native coronary artery with unspecified angina pectoris: Secondary | ICD-10-CM

## 2019-01-18 DIAGNOSIS — E1159 Type 2 diabetes mellitus with other circulatory complications: Secondary | ICD-10-CM | POA: Diagnosis not present

## 2019-01-18 DIAGNOSIS — I10 Essential (primary) hypertension: Secondary | ICD-10-CM | POA: Diagnosis not present

## 2019-01-18 DIAGNOSIS — E782 Mixed hyperlipidemia: Secondary | ICD-10-CM | POA: Diagnosis not present

## 2019-01-18 DIAGNOSIS — I5032 Chronic diastolic (congestive) heart failure: Secondary | ICD-10-CM

## 2019-01-18 MED ORDER — NITROGLYCERIN 0.4 MG SL SUBL: 0.4000 mg | SUBLINGUAL_TABLET | SUBLINGUAL | 5 refills | Status: DC | PRN

## 2019-01-18 NOTE — Patient Instructions (Addendum)
Medication Instructions:  Your physician recommends that you continue on your current medications as directed. Please refer to the Current Medication list given to you today.  If you need a refill on your cardiac medications before your next appointment, please call your pharmacy.   Lab work: None Ordered  If you have labs (blood work) drawn today and your tests are completely normal, you will receive your results only by: Marland Kitchen MyChart Message (if you have MyChart) OR . A paper copy in the mail If you have any lab test that is abnormal or we need to change your treatment, we will call you to review the results.  Testing/Procedures: None ordered  Follow-Up: At [facility], you and your health needs are our priority.  As part of our continuing mission to provide you with exceptional heart care, we have created designated Provider Care Teams.  These Care Teams include your primary Cardiologist (physician) and Advanced Practice Providers (APPs -  Physician Assistants and Nurse Practitioners) who all work together to provide you with the care you need, when you need it. . You will need a follow up appointment in 1 year.  Please call our office 2 months in advance to schedule this appointment.  You may see Casandra Doffing, MD or one of the following Advanced Practice Providers on your designated Care Team:   . Lyda Jester, PA-C . Dayna Dunn, PA-C . Ermalinda Barrios, PA-C  Any Other Special Instructions Will Be Listed Below (If Applicable).  You may call [phone] to get an appointment for your COVID vaccine

## 2019-01-31 ENCOUNTER — Ambulatory Visit: Payer: Medicare Other

## 2019-04-09 ENCOUNTER — Other Ambulatory Visit: Payer: Self-pay | Admitting: Interventional Cardiology

## 2019-04-12 ENCOUNTER — Telehealth: Payer: Self-pay | Admitting: Interventional Cardiology

## 2019-04-12 MED ORDER — CARVEDILOL 25 MG PO TABS: 25.0000 mg | ORAL_TABLET | Freq: Two times a day (BID) | ORAL | 3 refills | Status: DC

## 2019-04-12 NOTE — Telephone Encounter (Signed)
Pt c/o medication issue:  1. Name of Medication: carvedilol (COREG) 25 MG tablet  2. How are you currently taking this medication (dosage and times per day)? As directed  3. Are you having a reaction (difficulty breathing--STAT)? no  4. What is your medication issue? Patient states that [facility] discontinued this medication - she only has 2 pills left.

## 2019-04-12 NOTE — Telephone Encounter (Signed)
Spoke with patient. She states that [facility] told her that they no longer have the carvedilol?? I have sent in an RX for carvedilol to [facility] [address] per patient's request.   Patient also wanted to let Dr. Irish Lack know that her PCP, Dr. Sheryn Bison, d/c'd her metformin and atorvastatin d/t elevated kidney function and elevated liver function. She states that she is not on any other statin medicine at this time and that she is having repeat Fasting labs in June. Will forward to make Dr. Irish Lack aware.

## 2019-04-12 NOTE — Telephone Encounter (Signed)
Left message for patient to call back.   It looks like the RX for carvedilol was sent to [facility] on 04/09/19. Will need to find out if patient needs RX sent to local pharmacy instead, so that she does not run out.

## 2019-04-13 NOTE — Telephone Encounter (Signed)
OK.  Will see how LFTs respond and maybe can try once a week Crestor after June labs.

## 2019-04-13 NOTE — Telephone Encounter (Signed)
Patient notified

## 2019-04-14 ENCOUNTER — Ambulatory Visit (INDEPENDENT_AMBULATORY_CARE_PROVIDER_SITE_OTHER): Payer: Medicare Other

## 2019-04-15 ENCOUNTER — Other Ambulatory Visit: Payer: Self-pay

## 2019-04-15 ENCOUNTER — Ambulatory Visit (INDEPENDENT_AMBULATORY_CARE_PROVIDER_SITE_OTHER): Payer: Medicare Other

## 2019-04-15 ENCOUNTER — Encounter (INDEPENDENT_AMBULATORY_CARE_PROVIDER_SITE_OTHER): Payer: Self-pay

## 2019-04-15 DIAGNOSIS — H43822 Vitreomacular adhesion, left eye: Secondary | ICD-10-CM

## 2019-04-15 DIAGNOSIS — H33102 Unspecified retinoschisis, left eye: Secondary | ICD-10-CM

## 2019-04-15 MED ORDER — OFLOXACIN 0.3 % OP SOLN: 1.0000 [drp] | Freq: Four times a day (QID) | OPHTHALMIC | 0 refills | Status: DC

## 2019-04-15 MED ORDER — PREDNISOLONE ACETATE 1 % OP SUSP: 1.0000 [drp] | Freq: Four times a day (QID) | OPHTHALMIC | 0 refills | Status: DC

## 2019-04-15 NOTE — Progress Notes (Signed)
04/15/2019     CHIEF COMPLAINT Patient presents for No chief complaint on file.   HISTORY OF PRESENT ILLNESS: Andrea Kirk is a 79 y.o. 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  Sx scheduled for 04/21/2019.     Last edited by Gerda Diss, [place] on 04/15/2019 11:37 AM. (History)        HISTORICAL INFORMATION:   Selected notes from the MEDICAL RECORD NUMBER    Lab Results  Component Value Date   HGBA1C 8.3 (H) 03/19/2016     CURRENT MEDICATIONS: No current outpatient medications on file. (Ophthalmic Drugs)   No current facility-administered medications for this visit. (Ophthalmic Drugs)   Current Outpatient Medications (Other)  Medication Sig  . acetaminophen (TYLENOL) 325 MG tablet Take 650 mg by mouth every 6 (six) hours as needed for moderate pain.  Marland Kitchen amLODipine (NORVASC) 5 MG tablet Take 1 tablet (5 mg total) by mouth daily.  . Ascorbic Acid (VITAMIN C) 1000 MG tablet Take 1,000 mg by mouth daily.  Marland Kitchen aspirin EC 81 MG tablet Take 81 mg by mouth every evening.   . Calcium Carbonate-Vitamin D (CALTRATE 600+D PO) Take 1 tablet by mouth daily.  . carvedilol (COREG) 25 MG tablet Take 1 tablet (25 mg total) by mouth 2 (two) times daily.  . cholecalciferol (VITAMIN D3) 25 MCG (1000 UT) tablet Take 1,000 Units by mouth daily.  . citalopram (CELEXA) 20 MG tablet Take 20 mg by mouth every morning.   . clopidogrel (PLAVIX) 75 MG tablet Take 75 mg by mouth every morning.   . Cyanocobalamin (B-12) 2500 MCG TABS Take 2,500 mcg by mouth daily.  Marland Kitchen FERROUS SULFATE PO Take 325 mg by mouth daily.  . fish oil-omega-3 fatty acids 1000 MG capsule Take 1 g by mouth daily.  . furosemide (LASIX) 20 MG tablet Take 20 mg by mouth daily as needed for fluid or edema.   . gabapentin (NEURONTIN) 300 MG capsule Take 300 mg by mouth 4 (four) times daily.   Marland Kitchen glipiZIDE (GLUCOTROL XL) 10 MG 24 hr tablet Take 10 mg by mouth daily with  breakfast.   . HYDROcodone-acetaminophen (NORCO) 10-325 MG tablet Take 1 tablet by mouth 2 (two) times daily as needed for severe pain.   Marland Kitchen ibuprofen (ADVIL,MOTRIN) 400 MG tablet Take 400 mg by mouth every 6 (six) hours as needed.  . meclizine (ANTIVERT) 25 MG tablet Take 25 mg by mouth 2 (two) times daily as needed for dizziness. For dizziness   . methocarbamol (ROBAXIN) 500 MG tablet Take 1 tablet (500 mg total) by mouth every 8 (eight) hours as needed for muscle spasms.  . nitroGLYCERIN (NITROSTAT) 0.4 MG SL tablet Place 1 tablet (0.4 mg total) under the tongue every 5 (five) minutes as needed. Chest pain.  . pantoprazole (PROTONIX) 40 MG tablet Take 40 mg by mouth daily as needed (INDIGESTION).   Marland Kitchen promethazine (PHENERGAN) 12.5 MG tablet Take 12.5 mg by mouth every 6 (six) hours as needed for nausea or vomiting.  Marland Kitchen atorvastatin (LIPITOR) 40 MG tablet Take 40 mg by mouth daily at 6 PM.   . metFORMIN (GLUCOPHAGE) 1000 MG tablet Take 1,000 mg by mouth 2 (two) times daily with a meal.    No current facility-administered medications for this visit. (Other)     ALLERGIES Allergies  Allergen Reactions  . Codeine Other (See Comments)    Abnormal behavior  .  Latex Other (See Comments)    tears skin  . Morphine And Related Other (See Comments)    Affects BP and blood sugar.  . Cyclobenzaprine     MADE PT SICK- pt unsure if med was cyclobenzaprine or methocarbamol   . Methocarbamol Other (See Comments)    MADE PT SICK- pt unsure if med was cyclobenzaprine or methocarbamol     PAST MEDICAL HISTORY Past Medical History:  Diagnosis Date  . Acute kidney injury ([place]) 08/05/2016  . Aftercare following surgery of the circulatory system, [place] 05/11/2013  . AKI (acute kidney injury) ([place]) 08/04/2016  . Anxiety    takes Celexa daily  . ARF (acute renal failure) ([place]) 11/07/2016  . Asymptomatic stenosis of right carotid artery 03/22/2016  . Back pain    occasionally  . Carotid artery disease ([place])  04/16/2013  . Carotid artery occlusion   . Carotid stenosis 11/16/2013  . Cataract    left and immature  . Coronary artery disease   . Coronary atherosclerosis of native coronary artery 03/11/2013   S/p CABG in 1997   . Depression   . Diabetes mellitus    takes Metformin and Glipizide daily  . Diabetes mellitus ([place]) 05/02/2015  . Dizziness    takes Meclizine daily as needed  . Essential hypertension, benign 03/11/2013  . GERD (gastroesophageal reflux disease)    takes Omeprazole daily as needed  . Headache(784.0)   . Hyperlipidemia    takes Atorvastatin daily  . Hypertension    takes Carvedilol daily  . Mixed hyperlipidemia 03/11/2013  . Muscle spasm    takes Robaxin daily as needed  . Nausea    takes Phenergan daily as needed  . Occlusion and stenosis of carotid artery without mention of cerebral infarction 07/19/2011  . Pneumonia    hx of-in high school  . Restless leg    takes Requip daily as needed  . Seasonal allergies    takes Claritin daily as needed and Afrin as needed  . Shortness of breath    with exertion  . Urinary urgency   . UTI (urinary tract infection) 11/07/2016   Past Surgical History:  Procedure Laterality Date  . COLONOSCOPY WITH PROPOFOL N/A 03/10/2012   Procedure: COLONOSCOPY WITH PROPOFOL;  Surgeon: Garlan Fair, MD;  Location: [facility];  Service: Endoscopy;  Laterality: N/A;  . CORNEAL TRANSPLANT Right   . CORONARY ARTERY BYPASS GRAFT  1997   x 6  . CORONARY ARTERY BYPASS GRAFT  Jan. 1997  . ENDARTERECTOMY Left 04/16/2013   Procedure: Left Carotid Artery Endatarectomy with Resection of Redundant Internal Carotid Artery;  Surgeon: Rosetta Posner, MD;  Location: [facility];  Service: Vascular;  Laterality: Left;  . ENDARTERECTOMY Right 03/22/2016   Procedure: RIGHT ENDARTERECTOMY CAROTID;  Surgeon: Rosetta Posner, MD;  Location: [facility];  Service: Vascular;  Laterality: Right;  . ESOPHAGOGASTRODUODENOSCOPY N/A 03/10/2012   Procedure: ESOPHAGOGASTRODUODENOSCOPY  (EGD);  Surgeon: Garlan Fair, MD;  Location: [facility];  Service: Endoscopy;  Laterality: N/A;  . EYE SURGERY  March 12, 2001   CORNEA TRANSPLANT Right eye  . PATCH ANGIOPLASTY Right 03/22/2016   Procedure: PATCH ANGIOPLASTY;  Surgeon: Rosetta Posner, MD;  Location: [facility];  Service: Vascular;  Laterality: Right;  . PR VEIN BYPASS GRAFT,AORTO-FEM-POP  1997  . SPINE SURGERY  march 2013   Back surgery  . TONSILLECTOMY    . TRIGGER FINGER RELEASE Left    thumb    FAMILY HISTORY Family History  Problem Relation Age of Onset  . Heart attack Father   . Heart disease Father 96       Before age of 40  . Hyperlipidemia Father   . Heart disease Brother        Heart dissease before age 31  . Hyperlipidemia Brother   . Cirrhosis Mother   . Heart attack Paternal Grandfather   . Heart disease Paternal Grandfather   . Cancer Maternal Grandmother   . Alcoholism Maternal Grandfather   . Heart attack Paternal Grandmother     SOCIAL HISTORY Social History   Tobacco Use  . Smoking status: Never Smoker  . Smokeless tobacco: Never Used  Substance Use Topics  . Alcohol use: No    Alcohol/week: 0.0 standard drinks  . Drug use: No         OPHTHALMIC EXAM:  Base Eye Exam    Visual Acuity (Snellen - Linear)      Right Left   Dist cc 20/80 +2 20/400   Dist ph cc 20/40 -1 20/70   Correction: Glasses       Tonometry (Tonopen, 11:27 AM)      Right Left   Pressure 9 8        Slit Lamp and Fundus Exam    External Exam      Right Left   External Normal Normal          IMAGING AND PROCEDURES  Imaging and Procedures for @TODAY @           ASSESSMENT/PLAN: 1. 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 Operative consent obtained with pre-op eye drops reviewed with Melvia L [place]all[place] and sent via [place] as needed. Post op instructions  reviewed with patient and per patient all questions answered.  Gerda Diss, COT

## 2019-04-21 ENCOUNTER — Encounter (INDEPENDENT_AMBULATORY_CARE_PROVIDER_SITE_OTHER): Payer: Medicare Other | Admitting: Ophthalmology

## 2019-04-21 DIAGNOSIS — H35342 Macular cyst, hole, or pseudohole, left eye: Secondary | ICD-10-CM | POA: Diagnosis not present

## 2019-04-21 DIAGNOSIS — H43822 Vitreomacular adhesion, left eye: Secondary | ICD-10-CM | POA: Diagnosis not present

## 2019-04-22 ENCOUNTER — Encounter (INDEPENDENT_AMBULATORY_CARE_PROVIDER_SITE_OTHER): Payer: Self-pay | Admitting: Ophthalmology

## 2019-04-22 ENCOUNTER — Other Ambulatory Visit: Payer: Self-pay

## 2019-04-22 ENCOUNTER — Ambulatory Visit (INDEPENDENT_AMBULATORY_CARE_PROVIDER_SITE_OTHER): Payer: Medicare Other | Admitting: Ophthalmology

## 2019-04-22 DIAGNOSIS — Z09 Encounter for follow-up examination after completed treatment for conditions other than malignant neoplasm: Secondary | ICD-10-CM | POA: Insufficient documentation

## 2019-04-22 DIAGNOSIS — H43822 Vitreomacular adhesion, left eye: Secondary | ICD-10-CM

## 2019-04-22 NOTE — Assessment & Plan Note (Signed)
Post vitrectomy and removal of vitreal macular traction left eye in addition to focal laser photocoagulation for a small retinal hole patient is to start her eyedrops

## 2019-04-22 NOTE — Patient Instructions (Signed)
No lifting and bending for 1 week. No water in the eye for 10 days. Do not rub the eye. Wear shield at night for 1-3 days.  Wear your CPAP as normal, if instructed by your doctor.  Continue your topical medications for a total of 3 weeks.  Do not refill your postoperative medications unless instructed.   

## 2019-04-22 NOTE — Progress Notes (Signed)
04/22/2019     CHIEF COMPLAINT Patient presents for Post-op Follow-up   HISTORY OF PRESENT ILLNESS: Andrea Kirk is a 79 y.o. female who presents to the clinic today for:   HPI    Post-op Follow-up    In left eye.  Discomfort includes Negative for pain.  Vision is stable.          Comments    1 day PO OS - S/p Vitrectomy OS, and focal laser for vitreal macular traction and a retinal hole found at the time of surgery Pateint states she slept off and on throughout the night. Patient denies pain.       Last edited by Hurman Horn, MD on 04/22/2019 11:16 AM. (History)      Referring physician: Aura Dials, MD [place],  [place] [phone]  HISTORICAL INFORMATION:   Selected notes from the MEDICAL RECORD NUMBER    Lab Results  Component Value Date   HGBA1C 8.3 (H) 03/19/2016     CURRENT MEDICATIONS: Current Outpatient Medications (Ophthalmic Drugs)  Medication Sig  . ofloxacin (OCUFLOX) 0.3 % ophthalmic solution Place 1 drop into the left eye 4 (four) times daily.  . prednisoLONE acetate (PRED FORTE) 1 % ophthalmic suspension Place 1 drop into the left eye 4 (four) times daily.   No current facility-administered medications for this visit. (Ophthalmic Drugs)   Current Outpatient Medications (Other)  Medication Sig  . acetaminophen (TYLENOL) 325 MG tablet Take 650 mg by mouth every 6 (six) hours as needed for moderate pain.  Marland Kitchen amLODipine (NORVASC) 5 MG tablet Take 1 tablet (5 mg total) by mouth daily.  . Ascorbic Acid (VITAMIN C) 1000 MG tablet Take 1,000 mg by mouth daily.  Marland Kitchen aspirin EC 81 MG tablet Take 81 mg by mouth every evening.   Marland Kitchen atorvastatin (LIPITOR) 40 MG tablet Take 40 mg by mouth daily at 6 PM.   . Calcium Carbonate-Vitamin D (CALTRATE 600+D PO) Take 1 tablet by mouth daily.  . carvedilol (COREG) 25 MG tablet Take 1 tablet (25 mg total) by mouth 2 (two) times daily.  . cholecalciferol (VITAMIN D3) 25 MCG (1000 UT) tablet Take 1,000 Units  by mouth daily.  . citalopram (CELEXA) 20 MG tablet Take 20 mg by mouth every morning.   . clopidogrel (PLAVIX) 75 MG tablet Take 75 mg by mouth every morning.   . Cyanocobalamin (B-12) 2500 MCG TABS Take 2,500 mcg by mouth daily.  Marland Kitchen FERROUS SULFATE PO Take 325 mg by mouth daily.  . fish oil-omega-3 fatty acids 1000 MG capsule Take 1 g by mouth daily.  . furosemide (LASIX) 20 MG tablet Take 20 mg by mouth daily as needed for fluid or edema.   . gabapentin (NEURONTIN) 300 MG capsule Take 300 mg by mouth 4 (four) times daily.   Marland Kitchen glipiZIDE (GLUCOTROL XL) 10 MG 24 hr tablet Take 10 mg by mouth daily with breakfast.   . HYDROcodone-acetaminophen (NORCO) 10-325 MG tablet Take 1 tablet by mouth 2 (two) times daily as needed for severe pain.   Marland Kitchen ibuprofen (ADVIL,MOTRIN) 400 MG tablet Take 400 mg by mouth every 6 (six) hours as needed.  . meclizine (ANTIVERT) 25 MG tablet Take 25 mg by mouth 2 (two) times daily as needed for dizziness. For dizziness   . metFORMIN (GLUCOPHAGE) 1000 MG tablet Take 1,000 mg by mouth 2 (two) times daily with a meal.   . methocarbamol (ROBAXIN) 500 MG tablet Take 1  tablet (500 mg total) by mouth every 8 (eight) hours as needed for muscle spasms.  . nitroGLYCERIN (NITROSTAT) 0.4 MG SL tablet Place 1 tablet (0.4 mg total) under the tongue every 5 (five) minutes as needed. Chest pain.  . pantoprazole (PROTONIX) 40 MG tablet Take 40 mg by mouth daily as needed (INDIGESTION).   Marland Kitchen promethazine (PHENERGAN) 12.5 MG tablet Take 12.5 mg by mouth every 6 (six) hours as needed for nausea or vomiting.   No current facility-administered medications for this visit. (Other)      REVIEW OF SYSTEMS:    ALLERGIES Allergies  Allergen Reactions  . Codeine Other (See Comments)    Abnormal behavior  . Latex Other (See Comments)    tears skin  . Morphine And Related Other (See Comments)    Affects BP and blood sugar.  . Cyclobenzaprine     MADE PT SICK- pt unsure if med was  cyclobenzaprine or methocarbamol   . Methocarbamol Other (See Comments)    MADE PT SICK- pt unsure if med was cyclobenzaprine or methocarbamol     PAST MEDICAL HISTORY Past Medical History:  Diagnosis Date  . Acute kidney injury ([place]) 08/05/2016  . Aftercare following surgery of the circulatory system, [place] 05/11/2013  . AKI (acute kidney injury) ([place]) 08/04/2016  . Anxiety    takes Celexa daily  . ARF (acute renal failure) ([place]) 11/07/2016  . Asymptomatic stenosis of right carotid artery 03/22/2016  . Back pain    occasionally  . Carotid artery disease ([place]) 04/16/2013  . Carotid artery occlusion   . Carotid stenosis 11/16/2013  . Cataract    left and immature  . Coronary artery disease   . Coronary atherosclerosis of native coronary artery 03/11/2013   S/p CABG in 1997   . Depression   . Diabetes mellitus    takes Metformin and Glipizide daily  . Diabetes mellitus ([place]) 05/02/2015  . Dizziness    takes Meclizine daily as needed  . Essential hypertension, benign 03/11/2013  . GERD (gastroesophageal reflux disease)    takes Omeprazole daily as needed  . Headache(784.0)   . Hyperlipidemia    takes Atorvastatin daily  . Hypertension    takes Carvedilol daily  . Mixed hyperlipidemia 03/11/2013  . Muscle spasm    takes Robaxin daily as needed  . Nausea    takes Phenergan daily as needed  . Occlusion and stenosis of carotid artery without mention of cerebral infarction 07/19/2011  . Pneumonia    hx of-in high school  . Restless leg    takes Requip daily as needed  . Seasonal allergies    takes Claritin daily as needed and Afrin as needed  . Shortness of breath    with exertion  . Urinary urgency   . UTI (urinary tract infection) 11/07/2016   Past Surgical History:  Procedure Laterality Date  . COLONOSCOPY WITH PROPOFOL N/A 03/10/2012   Procedure: COLONOSCOPY WITH PROPOFOL;  Surgeon: Garlan Fair, MD;  [facility];  Service: Endoscopy;  Laterality: N/A;  . CORNEAL  TRANSPLANT Right   . CORONARY ARTERY BYPASS GRAFT  1997   x 6  . CORONARY ARTERY BYPASS GRAFT  Jan. 1997  . ENDARTERECTOMY Left 04/16/2013   Procedure: Left Carotid Artery Endatarectomy with Resection of Redundant Internal Carotid Artery;  Surgeon: Rosetta Posner, MD;  Location: [facility];  Service: Vascular;  Laterality: Left;  . ENDARTERECTOMY Right 03/22/2016   Procedure: RIGHT ENDARTERECTOMY CAROTID;  Surgeon: Rosetta Posner, MD;  Location[facility];  Service: Vascular;  Laterality: Right;  . ESOPHAGOGASTRODUODENOSCOPY N/A 03/10/2012   Procedure: ESOPHAGOGASTRODUODENOSCOPY (EGD);  Surgeon: Garlan Fair, MD;  Location: [facility];  Service: Endoscopy;  Laterality: N/A;  . EYE SURGERY  March 12, 2001   CORNEA TRANSPLANT Right eye  . PATCH ANGIOPLASTY Right 03/22/2016   Procedure: PATCH ANGIOPLASTY;  Surgeon: Rosetta Posner, MD;  Location: [facility];  Service: Vascular;  Laterality: Right;  . PR VEIN BYPASS GRAFT,AORTO-FEM-POP  1997  . SPINE SURGERY  march 2013   Back surgery  . TONSILLECTOMY    . TRIGGER FINGER RELEASE Left    thumb    FAMILY HISTORY Family History  Problem Relation Age of Onset  . Heart attack Father   . Heart disease Father 67       Before age of 26  . Hyperlipidemia Father   . Heart disease Brother        Heart dissease before age 32  . Hyperlipidemia Brother   . Cirrhosis Mother   . Heart attack Paternal Grandfather   . Heart disease Paternal Grandfather   . Cancer Maternal Grandmother   . Alcoholism Maternal Grandfather   . Heart attack Paternal Grandmother     SOCIAL HISTORY Social History   Tobacco Use  . Smoking status: Never Smoker  . Smokeless tobacco: Never Used  Substance Use Topics  . Alcohol use: No    Alcohol/week: 0.0 standard drinks  . Drug use: No         OPHTHALMIC EXAM:  Base Eye Exam    Visual Acuity (Snellen - Linear)      Right Left   Dist [place] 20/400 20/60-2   Dist ph [place] 20/100 NI       Tonometry (Tonopen, 10:41 AM)      Right  Left   Pressure 12 9       Dilation    Left eye: 1.0% Mydriacyl, 2.5% Phenylephrine @ 10:41 AM        Slit Lamp and Fundus Exam    External Exam      Right Left   External Normal Normal       Slit Lamp Exam      Right Left   Lids/Lashes Normal Normal   Conjunctiva/Sclera White and quiet White and quiet   Cornea Clear Clear   Anterior Chamber Deep and quiet Deep and quiet   Iris Round and reactive Round and reactive   Lens Clear Posterior chamber intraocular lens   Anterior Vitreous Normal Normal       Fundus Exam      Right Left   Posterior Vitreous  Clear, vitrectomized   Disc  Normal   C/D Ratio  2.4   Macula  Normal   Vessels  Normal   Periphery  Normal          IMAGING AND PROCEDURES  Imaging and Procedures for 04/22/19           ASSESSMENT/PLAN:  Vitreomacular traction syndrome, left Post vitrectomy and removal of vitreal macular traction left eye in addition to focal laser photocoagulation for a small retinal hole patient is to start her eyedrops      ICD-10-CM   1. Vitreomacular traction syndrome, left  H43.822   2. Follow-up examination after eye surgery  Z09     1.  The left eye looks great after vitrectomy for vitreal macular traction.  2.  Return visit 1 week for exam and OCT macula left eye  3.  Ophthalmic Meds Ordered this visit:  No orders of the defined types were placed in this encounter.      Return in about 1 week (around 04/29/2019) for dilate, OS, POST OP, OCT.  Patient Instructions  No lifting and bending for 1 week. No water in the eye for 10 days. Do not rub the eye. Wear shield at night for 1-3 days.  Wear your CPAP as normal, if instructed by your doctor.  Continue your topical medications for a total of 3 weeks.  Do not refill your postoperative medications unless instructed.    Explained the diagnoses, plan, and follow up with the patient and they expressed understanding.  Patient expressed understanding of the  importance of proper follow up care.   Clent Demark Amylee Lodato M.D. Diseases & Surgery of the Retina and Vitreous [facility] 04/22/19     Abbreviations: M myopia (nearsighted); A astigmatism; H hyperopia (farsighted); P presbyopia; Mrx spectacle prescription;  CTL contact lenses; OD right eye; OS left eye; OU both eyes  XT exotropia; ET esotropia; PEK punctate epithelial keratitis; PEE punctate epithelial erosions; DES dry eye syndrome; MGD meibomian gland dysfunction; ATs artificial tears; PFAT's preservative free artificial tears; [place] nuclear sclerotic cataract; PSC posterior subcapsular cataract; ERM epi-retinal membrane; PVD posterior vitreous detachment; RD retinal detachment; DM diabetes mellitus; DR diabetic retinopathy; NPDR non-proliferative diabetic retinopathy; PDR proliferative diabetic retinopathy; CSME clinically significant macular edema; DME diabetic macular edema; dbh dot blot hemorrhages; CWS cotton wool spot; POAG primary open angle glaucoma; C/D cup-to-disc ratio; HVF humphrey visual field; GVF goldmann visual field; OCT optical coherence tomography; IOP intraocular pressure; BRVO Branch retinal vein occlusion; CRVO central retinal vein occlusion; CRAO central retinal artery occlusion; BRAO branch retinal artery occlusion; RT retinal tear; SB scleral buckle; PPV pars plana vitrectomy; VH Vitreous hemorrhage; PRP panretinal laser photocoagulation; IVK intravitreal kenalog; VMT vitreomacular traction; MH Macular hole;  NVD neovascularization of the disc; NVE neovascularization elsewhere; AREDS age related eye disease study; ARMD age related macular degeneration; POAG primary open angle glaucoma; EBMD epithelial/anterior basement membrane dystrophy; ACIOL anterior chamber intraocular lens; IOL intraocular lens; PCIOL posterior chamber intraocular lens; Phaco/IOL phacoemulsification with intraocular lens placement; [place] photorefractive keratectomy; LASIK laser assisted in situ  keratomileusis; HTN hypertension; DM diabetes mellitus; COPD chronic obstructive pulmonary disease

## 2019-04-29 ENCOUNTER — Other Ambulatory Visit: Payer: Self-pay

## 2019-04-29 ENCOUNTER — Encounter (INDEPENDENT_AMBULATORY_CARE_PROVIDER_SITE_OTHER): Payer: Self-pay | Admitting: Ophthalmology

## 2019-04-29 ENCOUNTER — Ambulatory Visit (INDEPENDENT_AMBULATORY_CARE_PROVIDER_SITE_OTHER): Payer: Medicare Other | Admitting: Ophthalmology

## 2019-04-29 DIAGNOSIS — H33102 Unspecified retinoschisis, left eye: Secondary | ICD-10-CM | POA: Diagnosis not present

## 2019-04-29 DIAGNOSIS — H43822 Vitreomacular adhesion, left eye: Secondary | ICD-10-CM | POA: Diagnosis not present

## 2019-04-29 DIAGNOSIS — Z09 Encounter for follow-up examination after completed treatment for conditions other than malignant neoplasm: Secondary | ICD-10-CM

## 2019-04-29 NOTE — Progress Notes (Signed)
04/29/2019     CHIEF COMPLAINT Patient presents for Post-op Follow-up   HISTORY OF PRESENT ILLNESS: Andrea Kirk is a 79 y.o. female who presents to the clinic today for:   HPI    Post-op Follow-up    In left eye.  Discomfort includes none.  Vision is improved.          Comments    1 Week POV s/p VMT sx OS,,  Pt sts VA seems to be slightly improved OS. Pt denies ocular pain, flashes of light, or floaters OU.         Last edited by Hurman Horn, MD on 04/29/2019 11:15 AM. (History)      Referring physician: Aura Dials, MD [place],  [place] [id number]  HISTORICAL INFORMATION:   Selected notes from the MEDICAL RECORD NUMBER    Lab Results  Component Value Date   HGBA1C 8.3 (H) 03/19/2016     CURRENT MEDICATIONS: Current Outpatient Medications (Ophthalmic Drugs)  Medication Sig  . ofloxacin (OCUFLOX) 0.3 % ophthalmic solution Place 1 drop into the left eye 4 (four) times daily.  . prednisoLONE acetate (PRED FORTE) 1 % ophthalmic suspension Place 1 drop into the left eye 4 (four) times daily.   No current facility-administered medications for this visit. (Ophthalmic Drugs)   Current Outpatient Medications (Other)  Medication Sig  . acetaminophen (TYLENOL) 325 MG tablet Take 650 mg by mouth every 6 (six) hours as needed for moderate pain.  Marland Kitchen amLODipine (NORVASC) 5 MG tablet Take 1 tablet (5 mg total) by mouth daily.  . Ascorbic Acid (VITAMIN C) 1000 MG tablet Take 1,000 mg by mouth daily.  Marland Kitchen aspirin EC 81 MG tablet Take 81 mg by mouth every evening.   Marland Kitchen atorvastatin (LIPITOR) 40 MG tablet Take 40 mg by mouth daily at 6 PM.   . Calcium Carbonate-Vitamin D (CALTRATE 600+D PO) Take 1 tablet by mouth daily.  . carvedilol (COREG) 25 MG tablet Take 1 tablet (25 mg total) by mouth 2 (two) times daily.  . cholecalciferol (VITAMIN D3) 25 MCG (1000 UT) tablet Take 1,000 Units by mouth daily.  . citalopram (CELEXA) 20 MG tablet Take 20 mg by mouth every  morning.   . clopidogrel (PLAVIX) 75 MG tablet Take 75 mg by mouth every morning.   . Cyanocobalamin (B-12) 2500 MCG TABS Take 2,500 mcg by mouth daily.  Marland Kitchen FERROUS SULFATE PO Take 325 mg by mouth daily.  . fish oil-omega-3 fatty acids 1000 MG capsule Take 1 g by mouth daily.  . furosemide (LASIX) 20 MG tablet Take 20 mg by mouth daily as needed for fluid or edema.   . gabapentin (NEURONTIN) 300 MG capsule Take 300 mg by mouth 4 (four) times daily.   Marland Kitchen glipiZIDE (GLUCOTROL XL) 10 MG 24 hr tablet Take 10 mg by mouth daily with breakfast.   . HYDROcodone-acetaminophen (NORCO) 10-325 MG tablet Take 1 tablet by mouth 2 (two) times daily as needed for severe pain.   Marland Kitchen ibuprofen (ADVIL,MOTRIN) 400 MG tablet Take 400 mg by mouth every 6 (six) hours as needed.  . meclizine (ANTIVERT) 25 MG tablet Take 25 mg by mouth 2 (two) times daily as needed for dizziness. For dizziness   . metFORMIN (GLUCOPHAGE) 1000 MG tablet Take 1,000 mg by mouth 2 (two) times daily with a meal.   . methocarbamol (ROBAXIN) 500 MG tablet Take 1 tablet (500 mg total) by mouth every 8 (eight) hours as  needed for muscle spasms.  . nitroGLYCERIN (NITROSTAT) 0.4 MG SL tablet Place 1 tablet (0.4 mg total) under the tongue every 5 (five) minutes as needed. Chest pain.  . pantoprazole (PROTONIX) 40 MG tablet Take 40 mg by mouth daily as needed (INDIGESTION).   Marland Kitchen promethazine (PHENERGAN) 12.5 MG tablet Take 12.5 mg by mouth every 6 (six) hours as needed for nausea or vomiting.   No current facility-administered medications for this visit. (Other)      REVIEW OF SYSTEMS:    ALLERGIES Allergies  Allergen Reactions  . Codeine Other (See Comments)    Abnormal behavior  . Latex Other (See Comments)    tears skin  . Morphine And Related Other (See Comments)    Affects BP and blood sugar.  . Cyclobenzaprine     MADE PT SICK- pt unsure if med was cyclobenzaprine or methocarbamol   . Methocarbamol Other (See Comments)    MADE PT  SICK- pt unsure if med was cyclobenzaprine or methocarbamol     PAST MEDICAL HISTORY Past Medical History:  Diagnosis Date  . Acute kidney injury ([place]) 08/05/2016  . Aftercare following surgery of the circulatory system, [place] 05/11/2013  . AKI (acute kidney injury) ([place]) 08/04/2016  . Anxiety    takes Celexa daily  . ARF (acute renal failure) ([place]) 11/07/2016  . Asymptomatic stenosis of right carotid artery 03/22/2016  . Back pain    occasionally  . Carotid artery disease ([place]) 04/16/2013  . Carotid artery occlusion   . Carotid stenosis 11/16/2013  . Cataract    left and immature  . Coronary artery disease   . Coronary atherosclerosis of native coronary artery 03/11/2013   S/p CABG in 1997   . Depression   . Diabetes mellitus    takes Metformin and Glipizide daily  . Diabetes mellitus ([place]) 05/02/2015  . Dizziness    takes Meclizine daily as needed  . Essential hypertension, benign 03/11/2013  . GERD (gastroesophageal reflux disease)    takes Omeprazole daily as needed  . Headache(784.0)   . Hyperlipidemia    takes Atorvastatin daily  . Hypertension    takes Carvedilol daily  . Mixed hyperlipidemia 03/11/2013  . Muscle spasm    takes Robaxin daily as needed  . Nausea    takes Phenergan daily as needed  . Occlusion and stenosis of carotid artery without mention of cerebral infarction 07/19/2011  . Pneumonia    hx of-in high school  . Restless leg    takes Requip daily as needed  . Seasonal allergies    takes Claritin daily as needed and Afrin as needed  . Shortness of breath    with exertion  . Urinary urgency   . UTI (urinary tract infection) 11/07/2016   Past Surgical History:  Procedure Laterality Date  . COLONOSCOPY WITH PROPOFOL N/A 03/10/2012   Procedure: COLONOSCOPY WITH PROPOFOL;  Surgeon: Garlan Fair, MD;  Location: [facility];  Service: Endoscopy;  Laterality: N/A;  . CORNEAL TRANSPLANT Right   . CORONARY ARTERY BYPASS GRAFT  1997   x 6  . CORONARY ARTERY  BYPASS GRAFT  Jan. 1997  . ENDARTERECTOMY Left 04/16/2013   Procedure: Left Carotid Artery Endatarectomy with Resection of Redundant Internal Carotid Artery;  Surgeon: Rosetta Posner, MD;  Location: [facility];  Service: Vascular;  Laterality: Left;  . ENDARTERECTOMY Right 03/22/2016   Procedure: RIGHT ENDARTERECTOMY CAROTID;  Surgeon: Rosetta Posner, MD;  Location: [facility];  Service: Vascular;  Laterality: Right;  .  ESOPHAGOGASTRODUODENOSCOPY N/A 03/10/2012   Procedure: ESOPHAGOGASTRODUODENOSCOPY (EGD);  Surgeon: Garlan Fair, MD;  Location: [facility];  Service: Endoscopy;  Laterality: N/A;  . EYE SURGERY  March 12, 2001   CORNEA TRANSPLANT Right eye  . PATCH ANGIOPLASTY Right 03/22/2016   Procedure: PATCH ANGIOPLASTY;  Surgeon: Rosetta Posner, MD;  Location: [facility];  Service: Vascular;  Laterality: Right;  . PR VEIN BYPASS GRAFT,AORTO-FEM-POP  1997  . SPINE SURGERY  march 2013   Back surgery  . TONSILLECTOMY    . TRIGGER FINGER RELEASE Left    thumb    FAMILY HISTORY Family History  Problem Relation Age of Onset  . Heart attack Father   . Heart disease Father 36       Before age of 63  . Hyperlipidemia Father   . Heart disease Brother        Heart dissease before age 76  . Hyperlipidemia Brother   . Cirrhosis Mother   . Heart attack Paternal Grandfather   . Heart disease Paternal Grandfather   . Cancer Maternal Grandmother   . Alcoholism Maternal Grandfather   . Heart attack Paternal Grandmother     SOCIAL HISTORY Social History   Tobacco Use  . Smoking status: Never Smoker  . Smokeless tobacco: Never Used  Substance Use Topics  . Alcohol use: No    Alcohol/week: 0.0 standard drinks  . Drug use: No         OPHTHALMIC EXAM:  Base Eye Exam    Visual Acuity (Snellen - Linear)      Right Left   Dist cc 20/200 20/60 -1   Dist ph cc 20/60 NI   Correction: Glasses       Tonometry (Tonopen, 9:51 AM)      Right Left   Pressure 09 13       Pupils      Dark Light Shape  React APD   Right 3 2 Round Sluggish None   Left 5 4 Round Sluggish None       Visual Fields (Counting fingers)      Left Right    Full Full       Extraocular Movement      Right Left    Full Full       Neuro/Psych    Oriented x3: Yes   Mood/Affect: Normal       Dilation    Left eye: 1.0% Mydriacyl, 2.5% Phenylephrine @ 9:51 AM        Slit Lamp and Fundus Exam    External Exam      Right Left   External Normal Normal       Slit Lamp Exam      Right Left   Lids/Lashes Normal Normal   Conjunctiva/Sclera White and quiet White and quiet   Cornea Clear Clear   Anterior Chamber Deep and quiet Deep and quiet   Iris Round and reactive Round and reactive   Lens Clear Posterior chamber intraocular lens   Anterior Vitreous Normal Normal       Fundus Exam      Right Left   Posterior Vitreous  Clear, vitrectomized   Disc  Normal   C/D Ratio  0.4   Macula  Normal   Vessels  Normal   Periphery  Normal          IMAGING AND PROCEDURES  Imaging and Procedures for @TODAY @  OCT, Retina - OU - Both Eyes  Right Eye Quality was good. Scan locations included subfoveal. Central Foveal Thickness: 222. Progression has been stable.   Left Eye Progression has improved. Findings include cystoid macular edema.   Notes OS, much improved status post vitrectomy for vitreal macular traction and inner foveal macular schisis,, residual stasis is present and is likely to continue to improve                ASSESSMENT/PLAN:  Macular retinoschisis, left Now improving 1 week status post vitrectomy      ICD-10-CM   1. 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 Follow-up examination after eye surgery  Z09   3. Macular retinoschisis, left  H33.102     1.  The anatomy of the macula is continuing to improve, vitreal macular traction is released.  There is no sign of a macular hole.  And her foveal retinal schisis is  improving.  2.  Current topical meds in 2 weeks or discontinue when complete, no refill needed  3.  Ophthalmic Meds Ordered this visit:  No orders of the defined types were placed in this encounter.      Return in about 8 weeks (around 06/24/2019) for dilate, OS, OCT.  Patient Instructions  Patient is to complete her current topical eyedrops in 2 weeks with no refills    Explained the diagnoses, plan, and follow up with the patient and they expressed understanding.  Patient expressed understanding of the importance of proper follow up care.   Clent Demark Rechelle Niebla M.D. Diseases & Surgery of the Retina and Vitreous [facility]ina & [facility] @TODAY @     Abbreviations: M myopia (nearsighted); A astigmatism; H hyperopia (farsighted); P presbyopia; Mrx spectacle prescription;  CTL contact lenses; OD right eye; OS left eye; OU both eyes  XT exotropia; ET esotropia; PEK punctate epithelial keratitis; PEE punctate epithelial erosions; DES dry eye syndrome; MGD meibomian gland dysfunction; ATs artificial tears; PFAT's preservative free artificial tears; [place] nuclear sclerotic cataract; PSC posterior subcapsular cataract; ERM epi-retinal membrane; PVD posterior vitreous detachment; RD retinal detachment; DM diabetes mellitus; DR diabetic retinopathy; NPDR non-proliferative diabetic retinopathy; PDR proliferative diabetic retinopathy; CSME clinically significant macular edema; DME diabetic macular edema; dbh dot blot hemorrhages; CWS cotton wool spot; POAG primary open angle glaucoma; C/D cup-to-disc ratio; HVF humphrey visual field; GVF goldmann visual field; OCT optical coherence tomography; IOP intraocular pressure; BRVO Branch retinal vein occlusion; CRVO central retinal vein occlusion; CRAO central retinal artery occlusion; BRAO branch retinal artery occlusion; RT retinal tear; SB scleral buckle; PPV pars plana vitrectomy; VH Vitreous hemorrhage; PRP panretinal laser photocoagulation; IVK  intravitreal kenalog; VMT vitreomacular traction; MH Macular hole;  NVD neovascularization of the disc; NVE neovascularization elsewhere; AREDS age related eye disease study; ARMD age related macular degeneration; POAG primary open angle glaucoma; EBMD epithelial/anterior basement membrane dystrophy; ACIOL anterior chamber intraocular lens; IOL intraocular lens; PCIOL posterior chamber intraocular lens; Phaco/IOL phacoemulsification with intraocular lens placement; [place] photorefractive keratectomy; LASIK laser assisted in situ keratomileusis; HTN hypertension; DM diabetes mellitus; COPD chronic obstructive pulmonary disease

## 2019-04-29 NOTE — Assessment & Plan Note (Signed)
Now improving 1 week status post vitrectomy

## 2019-04-29 NOTE — Patient Instructions (Signed)
Patient is to complete her current topical eyedrops in 2 weeks with no refills

## 2019-06-07 ENCOUNTER — Emergency Department (HOSPITAL_COMMUNITY): Payer: Medicare Other

## 2019-06-07 ENCOUNTER — Other Ambulatory Visit: Payer: Self-pay

## 2019-06-07 ENCOUNTER — Encounter (HOSPITAL_COMMUNITY): Payer: Self-pay | Admitting: Emergency Medicine

## 2019-06-07 ENCOUNTER — Emergency Department (HOSPITAL_COMMUNITY)
Admission: EM | Admit: 2019-06-07 | Discharge: 2019-06-07 | Disposition: A | Discharge status: Home / Self Care | Payer: Medicare Other | Attending: Emergency Medicine | Admitting: Emergency Medicine

## 2019-06-07 DIAGNOSIS — E119 Type 2 diabetes mellitus without complications: Secondary | ICD-10-CM | POA: Diagnosis not present

## 2019-06-07 DIAGNOSIS — Y92481 Parking lot as the place of occurrence of the external cause: Secondary | ICD-10-CM | POA: Insufficient documentation

## 2019-06-07 DIAGNOSIS — R1013 Epigastric pain: Secondary | ICD-10-CM | POA: Insufficient documentation

## 2019-06-07 DIAGNOSIS — Z7984 Long term (current) use of oral hypoglycemic drugs: Secondary | ICD-10-CM | POA: Insufficient documentation

## 2019-06-07 DIAGNOSIS — I2510 Atherosclerotic heart disease of native coronary artery without angina pectoris: Secondary | ICD-10-CM | POA: Diagnosis not present

## 2019-06-07 DIAGNOSIS — I110 Hypertensive heart disease with heart failure: Secondary | ICD-10-CM | POA: Insufficient documentation

## 2019-06-07 DIAGNOSIS — W19XXXA Unspecified fall, initial encounter: Secondary | ICD-10-CM

## 2019-06-07 DIAGNOSIS — T07XXXA Unspecified multiple injuries, initial encounter: Secondary | ICD-10-CM | POA: Diagnosis present

## 2019-06-07 DIAGNOSIS — S032XXA Dislocation of tooth, initial encounter: Secondary | ICD-10-CM | POA: Diagnosis not present

## 2019-06-07 DIAGNOSIS — R519 Headache, unspecified: Secondary | ICD-10-CM | POA: Insufficient documentation

## 2019-06-07 DIAGNOSIS — Y999 Unspecified external cause status: Secondary | ICD-10-CM | POA: Diagnosis not present

## 2019-06-07 DIAGNOSIS — Y9301 Activity, walking, marching and hiking: Secondary | ICD-10-CM | POA: Insufficient documentation

## 2019-06-07 DIAGNOSIS — S52502A Unspecified fracture of the lower end of left radius, initial encounter for closed fracture: Secondary | ICD-10-CM

## 2019-06-07 DIAGNOSIS — Z91040 Latex allergy status: Secondary | ICD-10-CM | POA: Diagnosis not present

## 2019-06-07 DIAGNOSIS — Z79899 Other long term (current) drug therapy: Secondary | ICD-10-CM | POA: Diagnosis not present

## 2019-06-07 DIAGNOSIS — I5032 Chronic diastolic (congestive) heart failure: Secondary | ICD-10-CM | POA: Diagnosis not present

## 2019-06-07 DIAGNOSIS — S82001A Unspecified fracture of right patella, initial encounter for closed fracture: Secondary | ICD-10-CM | POA: Diagnosis not present

## 2019-06-07 DIAGNOSIS — W1830XA Fall on same level, unspecified, initial encounter: Secondary | ICD-10-CM | POA: Diagnosis not present

## 2019-06-07 DIAGNOSIS — Z7901 Long term (current) use of anticoagulants: Secondary | ICD-10-CM | POA: Diagnosis not present

## 2019-06-07 MED ORDER — AMOXICILLIN-POT CLAVULANATE 875-125 MG PO TABS
1.0000 | ORAL_TABLET | Freq: Two times a day (BID) | ORAL | 0 refills | Status: DC
Start: 2019-06-07 — End: 2019-10-05

## 2019-06-07 MED ORDER — HYDROCODONE-ACETAMINOPHEN 5-325 MG PO TABS
2.0000 | ORAL_TABLET | Freq: Once | ORAL | Status: AC
  Administered 2019-06-07: 2 via ORAL
  Filled 2019-06-07: qty 2

## 2019-06-07 MED ORDER — IBUPROFEN 400 MG PO TABS
400.0000 mg | ORAL_TABLET | Freq: Once | ORAL | Status: AC | PRN
  Administered 2019-06-07: 400 mg via ORAL
  Filled 2019-06-07: qty 1

## 2019-06-07 NOTE — ED Notes (Signed)
Pt transported to Xray. 

## 2019-06-07 NOTE — Progress Notes (Signed)
Orthopedic Tech Progress Note Patient Details:  Andrea Kirk [birth year] [id number]  Ortho Devices Type of Ortho Device: Arm sling, Sugartong splint, Knee Immobilizer Ortho Device/Splint Location: Right Knee and left arm Ortho Device/Splint Interventions: Ordered, Application, Adjustment   Post Interventions Patient Tolerated: Well Instructions Provided: Adjustment of device, Care of device, Poper ambulation with device   Tammy Sours 06/07/2019, 6:43 PM

## 2019-06-07 NOTE — ED Triage Notes (Addendum)
Patient brought in by [facility] after falling at [facility]. Patient denies any dizziness or LOC, states she was alert as the fell to the ground. Patient fell to the ground and knocked out a tooth, complains of bilateral rib pain, left wrist pain, and right knee pain. Patient alert, oriented, and in no apparent distress at this time.

## 2019-06-07 NOTE — ED Provider Notes (Signed)
[name] Provider Note   CSN: [id number] Arrival date & time: 06/07/19  1347     History Chief Complaint  Patient presents with  . Fall    Andrea Kirk is a 79 y.o. female.  HPI 79 year old female presents after a fall.  Andrea Kirk went to [employer] and while walking in a parking lot Andrea Kirk fell forward.  Andrea Kirk states Andrea Kirk has a history of falls like this and did not feel lightheaded or pass out.  Andrea Kirk has been worked up for this before but it is unclear why Andrea Kirk has lost.  Andrea Kirk has epigastric pain hurting her.  Andrea Kirk states Andrea Kirk felt her face and lost a tooth and another is cracked lips.  Denies any headache.  Injured her left wrist and right knee pain as well as her bilateral lower chest.  No abdominal pain.  Pain is a 10/10.  Andrea Kirk is chronically on 10-325 mg hydrocodone.   Past Medical History:  Diagnosis Date  . Acute kidney injury ([place]) 08/05/2016  . Aftercare following surgery of the circulatory system, [place] 05/11/2013  . AKI (acute kidney injury) ([place]) 08/04/2016  . Anxiety    takes Celexa daily  . ARF (acute renal failure) ([place]) 11/07/2016  . Asymptomatic stenosis of right carotid artery 03/22/2016  . Back pain    occasionally  . Carotid artery disease ([place]) 04/16/2013  . Carotid artery occlusion   . Carotid stenosis 11/16/2013  . Cataract    left and immature  . Coronary artery disease   . Coronary atherosclerosis of native coronary artery 03/11/2013   S/p CABG in 1997   . Depression   . Diabetes mellitus    takes Metformin and Glipizide daily  . Diabetes mellitus ([place]) 05/02/2015  . Dizziness    takes Meclizine daily as needed  . Essential hypertension, benign 03/11/2013  . GERD (gastroesophageal reflux disease)    takes Omeprazole daily as needed  . Headache(784.0)   . Hyperlipidemia    takes Atorvastatin daily  . Hypertension    takes Carvedilol daily  . Mixed hyperlipidemia 03/11/2013  . Muscle spasm    takes Robaxin daily as needed  .  Nausea    takes Phenergan daily as needed  . Occlusion and stenosis of carotid artery without mention of cerebral infarction 07/19/2011  . Pneumonia    hx of-in high school  . Restless leg    takes Requip daily as needed  . Seasonal allergies    takes Claritin daily as needed and Afrin as needed  . Shortness of breath    with exertion  . Urinary urgency   . UTI (urinary tract infection) 11/07/2016    Patient Active Problem List   Diagnosis Date Noted  . Follow-up examination after eye surgery 04/22/2019  . 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 Depression 12/22/2017  . Chronic pain 12/22/2017  . 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 Falls 12/22/2017  . Chronic diastolic CHF (congestive heart failure) ([place]) 12/22/2017  . Hypoxia 12/21/2017  . ARF (acute renal failure) ([place]) 11/07/2016  . UTI (urinary tract infection) 11/07/2016  . Acute kidney injury ([place]) 08/05/2016  . AKI (acute kidney injury) ([place]) 08/04/2016  . Asymptomatic stenosis of right carotid artery 03/22/2016  . Diabetes mellitus ([place]) 05/02/2015  . Carotid stenosis 11/16/2013  . Aftercare following surgery of the circulatory system, [place] 05/11/2013  . Carotid artery disease ([place]) 04/16/2013  . Coronary artery disease 03/11/2013  .  Essential hypertension, benign 03/11/2013  . Hyperlipidemia 03/11/2013  . Occlusion and stenosis of carotid artery without mention of cerebral infarction 07/19/2011    Past Surgical History:  Procedure Laterality Date  . COLONOSCOPY WITH PROPOFOL N/A 03/10/2012   Procedure: COLONOSCOPY WITH PROPOFOL;  Surgeon: Garlan Fair, MD;  Location: [facility];  Service: Endoscopy;  Laterality: N/A;  . CORNEAL TRANSPLANT Right   . CORONARY ARTERY BYPASS GRAFT  1997   x 6  . CORONARY ARTERY BYPASS GRAFT  Jan. 1997  . ENDARTERECTOMY Left 04/16/2013   Procedure: Left Carotid Artery Endatarectomy with Resection of Redundant  Internal Carotid Artery;  Surgeon: Rosetta Posner, MD;  Location: [facility];  Service: Vascular;  Laterality: Left;  . ENDARTERECTOMY Right 03/22/2016   Procedure: RIGHT ENDARTERECTOMY CAROTID;  Surgeon: Rosetta Posner, MD;  Location: [facility] OR;  Service: Vascular;  Laterality: Right;  . ESOPHAGOGASTRODUODENOSCOPY N/A 03/10/2012   Procedure: ESOPHAGOGASTRODUODENOSCOPY (EGD);  Surgeon: Garlan Fair, MD;  Location: [facility];  Service: Endoscopy;  Laterality: N/A;  . EYE SURGERY  March 12, 2001   CORNEA TRANSPLANT Right eye  . PATCH ANGIOPLASTY Right 03/22/2016   Procedure: PATCH ANGIOPLASTY;  Surgeon: Rosetta Posner, MD;  Location: [facility];  Service: Vascular;  Laterality: Right;  . PR VEIN BYPASS GRAFT,AORTO-FEM-POP  1997  . SPINE SURGERY  march 2013   Back surgery  . TONSILLECTOMY    . TRIGGER FINGER RELEASE Left    thumb     OB History   No obstetric history on file.     Family History  Problem Relation Age of Onset  . Heart attack Father   . Heart disease Father 60       Before age of 1  . Hyperlipidemia Father   . Heart disease Brother        Heart dissease before age 42  . Hyperlipidemia Brother   . Cirrhosis Mother   . Heart attack Paternal Grandfather   . Heart disease Paternal Grandfather   . Cancer Maternal Grandmother   . Alcoholism Maternal Grandfather   . Heart attack Paternal Grandmother     Social History   Tobacco Use  . Smoking status: Never Smoker  . Smokeless tobacco: Never Used  Substance Use Topics  . Alcohol use: No    Alcohol/week: 0.0 standard drinks  . Drug use: No    Home Medications Prior to Admission medications   Medication Sig Start Date End Date Taking? Authorizing Provider  acetaminophen (TYLENOL) 325 MG tablet Take 650 mg by mouth every 6 (six) hours as needed for moderate pain.    [provider]  amLODipine (NORVASC) 5 MG tablet Take 1 tablet (5 mg total) by mouth daily. 11/08/16   Bonnielee Haff, MD  amoxicillin-clavulanate  (AUGMENTIN) 875-125 MG tablet Take 1 tablet by mouth 2 (two) times daily. One po bid x 7 days 06/07/19   Sherwood Gambler, MD  Ascorbic Acid (VITAMIN C) 1000 MG tablet Take 1,000 mg by mouth daily.    [provider]  aspirin EC 81 MG tablet Take 81 mg by mouth every evening.     [provider]  atorvastatin (LIPITOR) 40 MG tablet Take 40 mg by mouth daily at 6 PM.     [provider]  Calcium Carbonate-Vitamin D (CALTRATE 600+D PO) Take 1 tablet by mouth daily.    [provider]  carvedilol (COREG) 25 MG tablet Take 1 tablet (25 mg total) by mouth 2 (two) times daily.  04/12/19   Jettie Booze, MD  cholecalciferol (VITAMIN D3) 25 MCG (1000 UT) tablet Take 1,000 Units by mouth daily.    [provider]  citalopram (CELEXA) 20 MG tablet Take 20 mg by mouth every morning.     [provider]  clopidogrel (PLAVIX) 75 MG tablet Take 75 mg by mouth every morning.     [provider]  Cyanocobalamin (B-12) 2500 MCG TABS Take 2,500 mcg by mouth daily.    [provider]  FERROUS SULFATE PO Take 325 mg by mouth daily.    [provider]  fish oil-omega-3 fatty acids 1000 MG capsule Take 1 g by mouth daily.    [provider]  furosemide (LASIX) 20 MG tablet Take 20 mg by mouth daily as needed for fluid or edema.  11/27/17   [provider]  gabapentin (NEURONTIN) 300 MG capsule Take 300 mg by mouth 4 (four) times daily.  02/28/16   [provider]  glipiZIDE (GLUCOTROL XL) 10 MG 24 hr tablet Take 10 mg by mouth daily with breakfast.     [provider]  HYDROcodone-acetaminophen (NORCO) 10-325 MG tablet Take 1 tablet by mouth 2 (two) times daily as needed for severe pain.  10/25/16   [provider]  ibuprofen (ADVIL,MOTRIN) 400 MG tablet Take 400 mg by mouth every 6 (six) hours as needed.    [provider]  meclizine (ANTIVERT) 25 MG tablet Take 25 mg by mouth 2 (two)  times daily as needed for dizziness. For dizziness     [provider]  metFORMIN (GLUCOPHAGE) 1000 MG tablet Take 1,000 mg by mouth 2 (two) times daily with a meal.  02/09/15   [provider]  methocarbamol (ROBAXIN) 500 MG tablet Take 1 tablet (500 mg total) by mouth every 8 (eight) hours as needed for muscle spasms. 08/06/16   Mikhail, Velta Addison, DO  nitroGLYCERIN (NITROSTAT) 0.4 MG SL tablet Place 1 tablet (0.4 mg total) under the tongue every 5 (five) minutes as needed. Chest pain. 01/18/19   Jettie Booze, MD  ofloxacin (OCUFLOX) 0.3 % ophthalmic solution Place 1 drop into the left eye 4 (four) times daily. 04/19/19   Rankin, Clent Demark, MD  pantoprazole (PROTONIX) 40 MG tablet Take 40 mg by mouth daily as needed (INDIGESTION).  01/10/15   [provider]  prednisoLONE acetate (PRED FORTE) 1 % ophthalmic suspension Place 1 drop into the left eye 4 (four) times daily. 04/19/19   Rankin, Clent Demark, MD  promethazine (PHENERGAN) 12.5 MG tablet Take 12.5 mg by mouth every 6 (six) hours as needed for nausea or vomiting.    [provider]    Allergies    Codeine, Latex, Morphine and related, Cyclobenzaprine, and Methocarbamol  Review of Systems   Review of Systems  HENT: Positive for dental problem and facial swelling.   Respiratory: Negative for shortness of breath.   Cardiovascular: Positive for chest pain.  Musculoskeletal: Positive for arthralgias and joint swelling.  Neurological: Negative for dizziness, syncope, light-headedness and headaches.  All other systems reviewed and are negative.   Physical Exam Updated Vital Signs BP (!) 175/78 (BP Location: Right Arm)   Pulse 67   Temp 98.3 F (36.8 C) (Oral)   Resp 15   Ht 5\' 5"  (1.651 m)   Wt 80.3 kg   SpO2 99%   BMI 29.45 kg/m   Physical Exam Vitals and nursing note reviewed.  Constitutional:      Appearance: Andrea Kirk  is well-developed.  HENT:     Head: Normocephalic.     Right Ear: External ear  normal.     Left Ear: External ear normal.     Nose: Nose normal.     Mouth/Throat:   Eyes:     General:        Right eye: No discharge.        Left eye: No discharge.  Cardiovascular:     Rate and Rhythm: Normal rate and regular rhythm.     Pulses:          Radial pulses are 2+ on the right side and 2+ on the left side.       Dorsalis pedis pulses are 2+ on the right side and 2+ on the left side.     Heart sounds: Normal heart sounds.  Pulmonary:     Effort: Pulmonary effort is normal.     Breath sounds: Normal breath sounds.  Chest:     Chest wall: Tenderness (bilateral lower anterior chest wall tenderness) present.  Abdominal:     Palpations: Abdomen is soft.     Tenderness: There is no abdominal tenderness.  Musculoskeletal:     Left wrist: Swelling and tenderness present. No deformity. Normal range of motion.     Right knee: Swelling present. Normal range of motion. Tenderness present. Normal alignment.  Skin:    General: Skin is warm and dry.  Neurological:     Mental Status: Andrea Kirk is alert.  Psychiatric:        Mood and Affect: Mood is not anxious.     ED Results / Procedures / Treatments   Labs (all labs ordered are listed, but only abnormal results are displayed) Labs Reviewed - No data to display  EKG EKG Interpretation  Date/Time:  Monday Jun 07 2019 16:46:38 EDT Ventricular Rate:  63 PR Interval:    QRS Duration: 98 QT Interval:  468 QTC Calculation: 480 R Axis:   72 Text Interpretation: Sinus rhythm similar to Dec 2019 Confirmed by Sherwood Gambler [phone]) on 06/07/2019 6:00:30 PM   Radiology DG Ribs Bilateral W/Chest  Result Date: 06/07/2019 CLINICAL DATA:  Patient brought in by [facility] after falling. Patient denies any dizziness or LOC, states Andrea Kirk was alert as the fell to the ground. EXAM: BILATERAL RIBS AND CHEST - 4+ VIEW COMPARISON:  Chest radiograph 10/27/2017 FINDINGS: There is a possible, not definite fracture in posterior sixth left rib which is  seen on only one view. There is no evidence of pneumothorax or pleural effusion. Minimal atelectasis or scarring at the left lung base. Heart size and mediastinal contours are within normal limits. IMPRESSION: Possible left posterior sixth rib fracture. No pneumothorax or pleural effusion. Electronically Signed   By: Audie Pinto M.D.   On: 06/07/2019 17:46   DG Wrist Complete Left  Result Date: 06/07/2019 CLINICAL DATA:  Patient fell from standing position today, left wrist pain and swelling. EXAM: LEFT WRIST - COMPLETE 3+ VIEW COMPARISON:  None. FINDINGS: There is a nondisplaced transverse fracture of the distal left radius. The ulna and carpal bones appear intact. Regional soft tissues are unremarkable. IMPRESSION: Nondisplaced fracture of the distal left radius. Electronically Signed   By: Audie Pinto M.D.   On: 06/07/2019 14:38   CT Head Wo Contrast  Result Date: 06/07/2019 CLINICAL DATA:  Head trauma, headache; Facial trauma Fall at [facility] EXAM: CT HEAD WITHOUT CONTRAST CT MAXILLOFACIAL WITHOUT CONTRAST TECHNIQUE: Multidetector CT imaging of the head and maxillofacial  structures were performed using the standard protocol without intravenous contrast. Multiplanar CT image reconstructions of the maxillofacial structures were also generated. COMPARISON:  Brain MRI 12/01/2018, head CT 12/21/2017 FINDINGS: CT HEAD FINDINGS Brain: Brain volume normal for age. No intracranial hemorrhage, mass effect, or midline shift. No hydrocephalus. The basilar cisterns are patent. Minor chronic small vessel ischemia. No evidence of territorial infarct or acute ischemia. No extra-axial or intracranial fluid collection. Known right vestibular schwannoma is not well seen by CT. Vascular: Atherosclerosis of skullbase vasculature without hyperdense vessel or abnormal calcification. Skull: No fracture or focal lesion. Other: None. CT MAXILLOFACIAL FINDINGS Osseous: Nasal bone, zygomatic arches, and mandibles  are intact. Temporomandibular joints are congruent. The nasal septum is midline. There scattered missing teeth with dental caries. Suggestion of fracture through tooth 10, 11, and 12 of unknown acuity. No alveolar ridge fracture. Orbits: No orbital fracture. Both orbits and globes are intact. Bilateral cataract resection. Sinuses: No sinus fracture or fluid level. Mastoid air cells are clear. Soft tissues: Negative. IMPRESSION: 1. No acute intracranial abnormality. No skull fracture. 2. No facial bone fracture. 3. Scattered absent teeth and dental caries. Suggestion of fracture through tooth 10, 11, and 12 of unknown acuity. Electronically Signed   By: Keith Rake M.D.   On: 06/07/2019 18:19   DG Knee Complete 4 Views Right  Result Date: 06/07/2019 CLINICAL DATA:  Patient fell from standing position today, pain and stiffness in right knee. EXAM: RIGHT KNEE - COMPLETE 4+ VIEW COMPARISON:  None. FINDINGS: There is a small cortical linear lucency in the mid posterior aspect the patella suspicious for nondisplaced fracture. Moderate/large joint effusion. Mild degenerative changes. Multiple surgical clips noted in the regional soft tissues. IMPRESSION: Small cortical linear lucency in the mid posterior aspect of the patella suspicious for nondisplaced fracture. Associated moderate/large joint effusion. Electronically Signed   By: Audie Pinto M.D.   On: 06/07/2019 14:45   CT Maxillofacial Wo Contrast  Result Date: 06/07/2019 CLINICAL DATA:  Head trauma, headache; Facial trauma Fall at [facility]. EXAM: CT HEAD WITHOUT CONTRAST CT MAXILLOFACIAL WITHOUT CONTRAST TECHNIQUE: Multidetector CT imaging of the head and maxillofacial structures were performed using the standard protocol without intravenous contrast. Multiplanar CT image reconstructions of the maxillofacial structures were also generated. COMPARISON:  Brain MRI 12/01/2018, head CT 12/21/2017 FINDINGS: CT HEAD FINDINGS Brain: Brain volume  normal for age. No intracranial hemorrhage, mass effect, or midline shift. No hydrocephalus. The basilar cisterns are patent. Minor chronic small vessel ischemia. No evidence of territorial infarct or acute ischemia. No extra-axial or intracranial fluid collection. Known right vestibular schwannoma is not well seen by CT. Vascular: Atherosclerosis of skullbase vasculature without hyperdense vessel or abnormal calcification. Skull: No fracture or focal lesion. Other: None. CT MAXILLOFACIAL FINDINGS Osseous: Nasal bone, zygomatic arches, and mandibles are intact. Temporomandibular joints are congruent. The nasal septum is midline. There scattered missing teeth with dental caries. Suggestion of fracture through tooth 10, 11, and 12 of unknown acuity. No alveolar ridge fracture. Orbits: No orbital fracture. Both orbits and globes are intact. Bilateral cataract resection. Sinuses: No sinus fracture or fluid level. Mastoid air cells are clear. Soft tissues: Negative. IMPRESSION: 1. No acute intracranial abnormality. No skull fracture. 2. No facial bone fracture. 3. Scattered absent teeth and dental caries. Suggestion of fracture through tooth 10, 11, and 12 of unknown acuity. Electronically Signed   By: Keith Rake M.D.   On: 06/07/2019 18:19    Procedures Procedures (including critical care time)  Medications Ordered in ED Medications  ibuprofen (ADVIL) tablet 400 mg (400 mg Oral Given 06/07/19 1357)  HYDROcodone-acetaminophen (NORCO/VICODIN) 5-325 MG per tablet 2 tablet (2 tablets Oral Given 06/07/19 1833)    ED Course  I have reviewed the triage vital signs and the nursing notes.  Pertinent labs & imaging results that were available during my care of the patient were reviewed by me and considered in my medical decision making (see chart for details).    MDM Rules/Calculators/A&P                      Patient has multiple areas of injury including her avulsed tooth and partially avulsed tooth,  nondisplaced distal radius fracture, mild patella fracture, and rib fracture.  Andrea Kirk endorses pain control with her home dose of oral hydrocodone.  I offered to try to stabilize the tooth but Andrea Kirk declines and would like to just follow-up with her dentist.  Unfortunately either way the tooth is unlikely to survive.  Sounds like falls are a recurrent issue and this does not sound like syncope.  Andrea Kirk otherwise will be splinted for her wrist and follow-up with her orthopedic service at [facility].  Knee immobilizer placed for her knee. Final Clinical Impression(s) / ED Diagnoses Final diagnoses:  Fall, initial encounter  Closed nondisplaced fracture of right patella, unspecified fracture morphology, initial encounter  Closed fracture of distal end of left radius, unspecified fracture morphology, initial encounter  Avulsed tooth, initial encounter    Rx / DC Orders ED Discharge Orders         Ordered    amoxicillin-clavulanate (AUGMENTIN) 875-125 MG tablet  2 times daily     06/07/19 1855           Sherwood Gambler, MD 06/07/19 2156

## 2019-06-21 ENCOUNTER — Encounter (INDEPENDENT_AMBULATORY_CARE_PROVIDER_SITE_OTHER): Payer: Medicare Other | Admitting: Ophthalmology

## 2019-06-24 ENCOUNTER — Encounter (INDEPENDENT_AMBULATORY_CARE_PROVIDER_SITE_OTHER): Payer: Medicare Other | Admitting: Ophthalmology

## 2019-09-02 ENCOUNTER — Encounter (INDEPENDENT_AMBULATORY_CARE_PROVIDER_SITE_OTHER): Payer: Medicare Other | Admitting: Ophthalmology

## 2019-09-23 ENCOUNTER — Telehealth: Payer: Self-pay | Admitting: Hematology

## 2019-09-23 NOTE — Telephone Encounter (Signed)
Received a new pt referral from Dr. Sheryn Bison at [facility] for leukopenia, neutropenia, and mild thrombocytopenia. Ms. Joo returned my call and has been scheduled to see Dr. Irene Limbo on 9/28 at 1pm. Pt aware to arrive 30 minutes early. Letter mailed.

## 2019-09-27 ENCOUNTER — Other Ambulatory Visit: Payer: Self-pay

## 2019-09-27 ENCOUNTER — Encounter (INDEPENDENT_AMBULATORY_CARE_PROVIDER_SITE_OTHER): Payer: Self-pay | Admitting: Ophthalmology

## 2019-09-27 ENCOUNTER — Ambulatory Visit (INDEPENDENT_AMBULATORY_CARE_PROVIDER_SITE_OTHER): Payer: Medicare Other | Admitting: Ophthalmology

## 2019-09-27 DIAGNOSIS — H43822 Vitreomacular adhesion, left eye: Secondary | ICD-10-CM | POA: Diagnosis not present

## 2019-09-27 DIAGNOSIS — H33102 Unspecified retinoschisis, left eye: Secondary | ICD-10-CM

## 2019-09-27 DIAGNOSIS — E1159 Type 2 diabetes mellitus with other circulatory complications: Secondary | ICD-10-CM

## 2019-09-27 NOTE — Assessment & Plan Note (Signed)
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

## 2019-09-27 NOTE — Assessment & Plan Note (Signed)

## 2019-09-27 NOTE — Progress Notes (Signed)
09/27/2019     CHIEF COMPLAINT Patient presents for Retina Follow Up   HISTORY OF PRESENT ILLNESS: Andrea Kirk is a 79 y.o. female who presents to the clinic today for:   HPI    Retina Follow Up    Patient presents with  Other.  In left eye.  This started 5 months ago.  Severity is mild.  Duration of 5 months.  Since onset it is stable.          Comments    5 Month F/U OS (overdue post op)  Pt denies noticeable changes to [place] OU since last visit. Pt denies ocular pain, flashes of light, or floaters OU. Pt sts she is off all eye drops at this time.        Last edited by Rockie Neighbours, [place] on 09/27/2019  9:36 AM. (History)      Referring physician: Aura Dials, MD [place],  [place] [id number]  HISTORICAL INFORMATION:   Selected notes from the MEDICAL RECORD NUMBER    Lab Results  Component Value Date   HGBA1C 8.3 (H) 03/19/2016     CURRENT MEDICATIONS: Current Outpatient Medications (Ophthalmic Drugs)  Medication Sig  . ofloxacin (OCUFLOX) 0.3 % ophthalmic solution Place 1 drop into the left eye 4 (four) times daily. (Patient not taking: Reported on 09/27/2019)  . prednisoLONE acetate (PRED FORTE) 1 % ophthalmic suspension Place 1 drop into the left eye 4 (four) times daily. (Patient not taking: Reported on 09/27/2019)   No current facility-administered medications for this visit. (Ophthalmic Drugs)   Current Outpatient Medications (Other)  Medication Sig  . acetaminophen (TYLENOL) 325 MG tablet Take 650 mg by mouth every 6 (six) hours as needed for moderate pain.  Marland Kitchen amLODipine (NORVASC) 5 MG tablet Take 1 tablet (5 mg total) by mouth daily.  Marland Kitchen amoxicillin-clavulanate (AUGMENTIN) 875-125 MG tablet Take 1 tablet by mouth 2 (two) times daily. One po bid x 7 days  . Ascorbic Acid (VITAMIN C) 1000 MG tablet Take 1,000 mg by mouth daily.  Marland Kitchen aspirin EC 81 MG tablet Take 81 mg by mouth every evening.   Marland Kitchen atorvastatin (LIPITOR) 40 MG tablet Take 40 mg by  mouth daily at 6 PM.   . Calcium Carbonate-Vitamin D (CALTRATE 600+D PO) Take 1 tablet by mouth daily.  . carvedilol (COREG) 25 MG tablet Take 1 tablet (25 mg total) by mouth 2 (two) times daily.  . cholecalciferol (VITAMIN D3) 25 MCG (1000 UT) tablet Take 1,000 Units by mouth daily.  . citalopram (CELEXA) 20 MG tablet Take 20 mg by mouth every morning.   . clopidogrel (PLAVIX) 75 MG tablet Take 75 mg by mouth every morning.   . Cyanocobalamin (B-12) 2500 MCG TABS Take 2,500 mcg by mouth daily.  Marland Kitchen FERROUS SULFATE PO Take 325 mg by mouth daily.  . fish oil-omega-3 fatty acids 1000 MG capsule Take 1 g by mouth daily.  . furosemide (LASIX) 20 MG tablet Take 20 mg by mouth daily as needed for fluid or edema.   . gabapentin (NEURONTIN) 300 MG capsule Take 300 mg by mouth 4 (four) times daily.   Marland Kitchen glipiZIDE (GLUCOTROL XL) 10 MG 24 hr tablet Take 10 mg by mouth daily with breakfast.   . HYDROcodone-acetaminophen (NORCO) 10-325 MG tablet Take 1 tablet by mouth 2 (two) times daily as needed for severe pain.   Marland Kitchen ibuprofen (ADVIL,MOTRIN) 400 MG tablet Take 400 mg by mouth every 6 (six)  hours as needed.  . meclizine (ANTIVERT) 25 MG tablet Take 25 mg by mouth 2 (two) times daily as needed for dizziness. For dizziness   . metFORMIN (GLUCOPHAGE) 1000 MG tablet Take 1,000 mg by mouth 2 (two) times daily with a meal.   . methocarbamol (ROBAXIN) 500 MG tablet Take 1 tablet (500 mg total) by mouth every 8 (eight) hours as needed for muscle spasms.  . nitroGLYCERIN (NITROSTAT) 0.4 MG SL tablet Place 1 tablet (0.4 mg total) under the tongue every 5 (five) minutes as needed. Chest pain.  . pantoprazole (PROTONIX) 40 MG tablet Take 40 mg by mouth daily as needed (INDIGESTION).   Marland Kitchen promethazine (PHENERGAN) 12.5 MG tablet Take 12.5 mg by mouth every 6 (six) hours as needed for nausea or vomiting.   No current facility-administered medications for this visit. (Other)      REVIEW OF  SYSTEMS:    ALLERGIES Allergies  Allergen Reactions  . Codeine Other (See Comments)    Abnormal behavior  . Latex Other (See Comments)    tears skin  . Morphine And Related Other (See Comments)    Affects BP and blood sugar.  . Cyclobenzaprine     MADE PT SICK- pt unsure if med was cyclobenzaprine or methocarbamol   . Methocarbamol Other (See Comments)    MADE PT SICK- pt unsure if med was cyclobenzaprine or methocarbamol     PAST MEDICAL HISTORY Past Medical History:  Diagnosis Date  . Acute kidney injury ([place]) 08/05/2016  . Aftercare following surgery of the circulatory system, [place] 05/11/2013  . AKI (acute kidney injury) ([place]) 08/04/2016  . Anxiety    takes Celexa daily  . ARF (acute renal failure) ([place]) 11/07/2016  . Asymptomatic stenosis of right carotid artery 03/22/2016  . Back pain    occasionally  . Carotid artery disease ([place]) 04/16/2013  . Carotid artery occlusion   . Carotid stenosis 11/16/2013  . Cataract    left and immature  . Coronary artery disease   . Coronary atherosclerosis of native coronary artery 03/11/2013   S/p CABG in 1997   . Depression   . Diabetes mellitus    takes Metformin and Glipizide daily  . Diabetes mellitus ([place]) 05/02/2015  . Dizziness    takes Meclizine daily as needed  . Essential hypertension, benign 03/11/2013  . GERD (gastroesophageal reflux disease)    takes Omeprazole daily as needed  . Headache(784.0)   . Hyperlipidemia    takes Atorvastatin daily  . Hypertension    takes Carvedilol daily  . Mixed hyperlipidemia 03/11/2013  . Muscle spasm    takes Robaxin daily as needed  . Nausea    takes Phenergan daily as needed  . Occlusion and stenosis of carotid artery without mention of cerebral infarction 07/19/2011  . Pneumonia    hx of-in high school  . Restless leg    takes Requip daily as needed  . Seasonal allergies    takes Claritin daily as needed and Afrin as needed  . Shortness of breath    with exertion  . Urinary  urgency   . UTI (urinary tract infection) 11/07/2016   Past Surgical History:  Procedure Laterality Date  . COLONOSCOPY WITH PROPOFOL N/A 03/10/2012   Procedure: COLONOSCOPY WITH PROPOFOL;  Surgeon: Garlan Fair, MD;  Location: [facility];  Service: Endoscopy;  Laterality: N/A;  . CORNEAL TRANSPLANT Right   . CORONARY ARTERY BYPASS GRAFT  1997   x 6  . CORONARY ARTERY BYPASS GRAFT  Jan. 1997  . ENDARTERECTOMY Left 04/16/2013   Procedure: Left Carotid Artery Endatarectomy with Resection of Redundant Internal Carotid Artery;  Surgeon: Rosetta Posner, MD;  Location: [facility];  Service: Vascular;  Laterality: Left;  . ENDARTERECTOMY Right 03/22/2016   Procedure: RIGHT ENDARTERECTOMY CAROTID;  Surgeon: Rosetta Posner, MD;  [facility];  Service: Vascular;  Laterality: Right;  . ESOPHAGOGASTRODUODENOSCOPY N/A 03/10/2012   Procedure: ESOPHAGOGASTRODUODENOSCOPY (EGD);  Surgeon: Garlan Fair, MD;  Location: [facility];  Service: Endoscopy;  Laterality: N/A;  . EYE SURGERY  March 12, 2001   CORNEA TRANSPLANT Right eye  . PATCH ANGIOPLASTY Right 03/22/2016   Procedure: PATCH ANGIOPLASTY;  Surgeon: Rosetta Posner, MD;  Location: [facility];  Service: Vascular;  Laterality: Right;  . PR VEIN BYPASS GRAFT,AORTO-FEM-POP  1997  . SPINE SURGERY  march 2013   Back surgery  . TONSILLECTOMY    . TRIGGER FINGER RELEASE Left    thumb    FAMILY HISTORY Family History  Problem Relation Age of Onset  . Heart attack Father   . Heart disease Father 27       Before age of 26  . Hyperlipidemia Father   . Heart disease Brother        Heart dissease before age 25  . Hyperlipidemia Brother   . Cirrhosis Mother   . Heart attack Paternal Grandfather   . Heart disease Paternal Grandfather   . Cancer Maternal Grandmother   . Alcoholism Maternal Grandfather   . Heart attack Paternal Grandmother     SOCIAL HISTORY Social History   Tobacco Use  . Smoking status: Never Smoker  . Smokeless tobacco: Never Used   Vaping Use  . Vaping Use: Never used  Substance Use Topics  . Alcohol use: No    Alcohol/week: 0.0 standard drinks  . Drug use: No         OPHTHALMIC EXAM:  Base Eye Exam    Visual Acuity (ETDRS)      Right Left   Dist [place] CF @ 5' 20/60 -1   Dist ph [place] 20/100 -1 20/50  Pt forgot glasses today       Tonometry (Tonopen, 9:37 AM)      Right Left   Pressure 06 08       Pupils      Pupils Dark Light Shape React APD   Right PERRL 4 4 Round Minimal None   Left PERRL 4 4 Round Minimal None       Visual Fields (Counting fingers)      Left Right    Full Full       Extraocular Movement      Right Left    Full Full       Neuro/Psych    Oriented x3: Yes   Mood/Affect: Normal       Dilation    Left eye: 1.0% Mydriacyl, 2.5% Phenylephrine @ 9:41 AM        Slit Lamp and Fundus Exam    External Exam      Right Left   External Normal Normal       Slit Lamp Exam      Right Left   Lids/Lashes Normal Normal   Conjunctiva/Sclera White and quiet White and quiet   Cornea Clear Clear   Anterior Chamber Deep and quiet Deep and quiet   [place]ris Round and reactive Round and reactive   Lens Clear Posterior chamber intraocular lens   Anterior Vitreous Normal  Normal       Fundus Exam      Right Left   Posterior Vitreous Posterior vitreous detachment Clear, vitrectomized   Disc Normal Normal   C/D Ratio 0.4 0.4   Macula Normal Normal   Vessels Normal Normal   Periphery Normal Normal          IMAGING AND PROCEDURES  Imaging and Procedures for 09/27/19  OCT, Retina - OU - Both Eyes       Right Eye Quality was good. Scan locations included subfoveal. Central Foveal Thickness: 215. Findings include normal foveal contour.   Left Eye Quality was good. Scan locations included subfoveal. Central Foveal Thickness: 198. Findings include normal foveal contour.   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                ASSESSMENT/PLAN:  Macular retinoschisis, left This condition the left eye has now resolved status post vitrectomy for vitreal macular traction syndrome with improved acuity  Vitreomacular traction syndrome, left This condition the left eye has now resolved status post vitrectomy for vitreal macular traction syndrome with improved acuity  Diabetes mellitus ([place]) The patient has diabetes without any evidence of retinopathy. The patient advised to maintain good blood glucose control, excellent blood pressure control, and favorable levels of cholesterol, low density lipoprotein, and high density lipoproteins. Follow up in 1 year was recommended. Explained that fluctuations in visual acuity , or "out of focus", may result from large variations of blood sugar control.      ICD-10-CM   1. 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 Macular retinoschisis, left  H33.102   3. Type 2 diabetes mellitus with other circulatory complication, unspecified whether long term insulin use (HCC)  E11.59     1.  Released to the care of her eye doctor we will delighted to see her again in the future as needed basis or 2 years 2.  3.  Ophthalmic Meds Ordered this visit:  No orders of the defined types were placed in this encounter.      Return in about 2 years (around 09/26/2021) for DILATE OU, OCT.  There are no Patient Instructions on file for this visit.   Explained the diagnoses, plan, and follow up with the patient and they expressed understanding.  Patient expressed understanding of the importance of proper follow up care.   Clent Demark Jafar Poffenberger M.D. Diseases & Surgery of the Retina and Vitreous Retina & Diabetic [place] 09/27/19     Abbreviations: M myopia (nearsighted); A astigmatism; H hyperopia (farsighted); P presbyopia; Mrx spectacle prescription;  CTL contact lenses; OD right eye; OS left  eye; OU both eyes  XT exotropia; ET esotropia; PEK punctate epithelial keratitis; PEE punctate epithelial erosions; DES dry eye syndrome; MGD meibomian gland dysfunction; ATs artificial tears; PFAT's preservative free artificial tears; [place] nuclear sclerotic cataract; PSC posterior subcapsular cataract; ERM epi-retinal membrane; PVD posterior vitreous detachment; RD retinal detachment; DM diabetes mellitus; DR diabetic retinopathy; NPDR non-proliferative diabetic retinopathy; PDR proliferative diabetic retinopathy; CSME clinically significant macular edema; DME diabetic macular edema; dbh dot blot hemorrhages; CWS cotton wool spot; POAG primary open angle glaucoma; C/D cup-to-disc ratio; HVF humphrey visual field; GVF goldmann visual field; OCT optical coherence tomography; IOP intraocular pressure; BRVO Branch retinal vein occlusion; CRVO central retinal vein occlusion; CRAO central retinal artery occlusion; BRAO branch retinal artery  occlusion; RT retinal tear; SB scleral buckle; PPV pars plana vitrectomy; VH Vitreous hemorrhage; PRP panretinal laser photocoagulation; IVK intravitreal kenalog; VMT vitreomacular traction; MH Macular hole;  NVD neovascularization of the disc; NVE neovascularization elsewhere; AREDS age related eye disease study; ARMD age related macular degeneration; POAG primary open angle glaucoma; EBMD epithelial/anterior basement membrane dystrophy; ACIOL anterior chamber intraocular lens; IOL intraocular lens; PCIOL posterior chamber intraocular lens; Phaco/IOL phacoemulsification with intraocular lens placement; [place] photorefractive keratectomy; LASIK laser assisted in situ keratomileusis; HTN hypertension; DM diabetes mellitus; COPD chronic obstructive pulmonary disease

## 2019-10-05 ENCOUNTER — Inpatient Hospital Stay: Payer: Medicare Other

## 2019-10-05 ENCOUNTER — Inpatient Hospital Stay: Payer: Medicare Other | Attending: Hematology | Admitting: Hematology

## 2019-10-05 ENCOUNTER — Other Ambulatory Visit: Payer: Self-pay

## 2019-10-05 VITALS — BP 127/94 | HR 70 | Temp 97.9°F | Resp 18 | Ht 65.0 in | Wt 170.6 lb

## 2019-10-05 DIAGNOSIS — D709 Neutropenia, unspecified: Secondary | ICD-10-CM | POA: Diagnosis not present

## 2019-10-05 DIAGNOSIS — D696 Thrombocytopenia, unspecified: Secondary | ICD-10-CM | POA: Diagnosis not present

## 2019-10-05 DIAGNOSIS — D72819 Decreased white blood cell count, unspecified: Secondary | ICD-10-CM | POA: Diagnosis not present

## 2019-10-05 DIAGNOSIS — D7589 Other specified diseases of blood and blood-forming organs: Secondary | ICD-10-CM | POA: Diagnosis not present

## 2019-10-05 LAB — RETICULOCYTES
Immature Retic Fract: 10.8 % (ref 2.3–15.9)
RBC.: 3.89 MIL/uL (ref 3.87–5.11)
Retic Count, Absolute: 73.9 10*3/uL (ref 19.0–186.0)
Retic Ct Pct: 1.9 % (ref 0.4–3.1)

## 2019-10-05 LAB — CBC WITH DIFFERENTIAL/PLATELET
Abs Immature Granulocytes: 0.01 10*3/uL (ref 0.00–0.07)
Basophils Absolute: 0 10*3/uL (ref 0.0–0.1)
Basophils Relative: 0 %
Eosinophils Absolute: 0.1 10*3/uL (ref 0.0–0.5)
Eosinophils Relative: 2 %
HCT: 42.3 % (ref 36.0–46.0)
Hemoglobin: 14.2 g/dL (ref 12.0–15.0)
Immature Granulocytes: 0 %
Lymphocytes Relative: 24 %
Lymphs Abs: 1 10*3/uL (ref 0.7–4.0)
MCH: 37.2 pg — ABNORMAL HIGH (ref 26.0–34.0)
MCHC: 33.6 g/dL (ref 30.0–36.0)
MCV: 110.7 fL — ABNORMAL HIGH (ref 80.0–100.0)
Monocytes Absolute: 0.3 10*3/uL (ref 0.1–1.0)
Monocytes Relative: 7 %
Neutro Abs: 2.7 10*3/uL (ref 1.7–7.7)
Neutrophils Relative %: 67 %
Platelets: 172 10*3/uL (ref 150–400)
RBC: 3.82 MIL/uL — ABNORMAL LOW (ref 3.87–5.11)
RDW: 12.6 % (ref 11.5–15.5)
WBC: 4.1 10*3/uL (ref 4.0–10.5)
nRBC: 0 % (ref 0.0–0.2)

## 2019-10-05 LAB — CMP (CANCER CENTER ONLY)
ALT: 19 U/L (ref 0–44)
AST: 25 U/L (ref 15–41)
Albumin: 3.9 g/dL (ref 3.5–5.0)
Alkaline Phosphatase: 80 U/L (ref 38–126)
Anion gap: 7 (ref 5–15)
BUN: 20 mg/dL (ref 8–23)
CO2: 30 mmol/L (ref 22–32)
Calcium: 9.9 mg/dL (ref 8.9–10.3)
Chloride: 101 mmol/L (ref 98–111)
Creatinine: 0.8 mg/dL (ref 0.44–1.00)
GFR, Est AFR Am: >60 mL/min (ref >60)
GFR, Estimated: >60 mL/min (ref >60)
Glucose, Bld: 162 mg/dL — ABNORMAL HIGH (ref 70–99)
Potassium: 4.4 mmol/L (ref 3.5–5.1)
Sodium: 138 mmol/L (ref 135–145)
Total Bilirubin: 0.7 mg/dL (ref 0.3–1.2)
Total Protein: 7.6 g/dL (ref 6.5–8.1)

## 2019-10-05 LAB — VITAMIN B12: Vitamin B-12: 1126 pg/mL — ABNORMAL HIGH (ref 180–914)

## 2019-10-05 LAB — LACTATE DEHYDROGENASE: LDH: 246 U/L — ABNORMAL HIGH (ref 98–192)

## 2019-10-05 NOTE — Progress Notes (Signed)
HEMATOLOGY/ONCOLOGY CONSULTATION NOTE  Date of Service: 10/05/2019  Patient Care Team: Aura Dials, MD as PCP - General (Family Medicine) Jettie Booze, MD as Consulting Physician (Cardiology) Garrel Ridgel, [place] as Consulting Physician (Podiatry) Erline Levine, MD as Consulting Physician (Neurosurgery) Shon Hough, MD as Consulting Physician (Ophthalmology) [facility], [facility] (Family Medicine)  CHIEF COMPLAINTS/PURPOSE OF CONSULTATION:  Leukopenia, Neutropenia, Mild Thrombocytopenia  HISTORY OF PRESENTING ILLNESS:   Andrea Kirk is a wonderful 79 y.o. female who has been referred to [place] by Dr. Sheryn Bison for evaluation and management of leukopenia, neutropenia, and mild thrombocytopenia. The pt reports that she is doing well overall.   The pt reports that she had a fall on Memorial Day. Pt sustained several injuries from the fall. She has had increased fatigue and sluggishness since then. Pt has been taking Hydrocodone as prescribed and Tylenol as needed for pain management. Pt is also on Lasix.   Since May pt has been taken of off Metformin and Atorvostatin and placed on Rosuvastatin. She denies any recent infections. Pt takes Vitamin E75 and Folic acid daily. Pt has received her COVID19 and flu vaccines.  Most recent lab results (09/15/2019) of CBC w/diff is as follows: all values are WNL except for WBC at 3.3K, RBC at 3.83, MCV at 113.1, MCH at 37.8, PLT at 143K, Neutro Abs at 1.5K. 09/15/2019 Vitamin B12 at 1161, Folate at 6.3 08/31/2019 WBC at 2.4K, RBC at 3.10, HCT at 36.2, MCV at 116.8, MCH at 39.5, Mono Rel at 14.8, Neutro Abs at 1.3K, Lymphs Abs at 0.70K, Glucose at 149, BUN at 31, CO2 at 34  On review of systems, pt reports fatigue and denies bone pain, fevers, chills, rash, unexpected weight loss, sleeplessness, low appetite and any other symptoms.   On PMHx the pt reports Dizziness, HTN, HLD, Right Cornea Transplant. On Social Hx the pt  reports that she is a non-smoker and does not drink any alcohol currently. There is no concern for heavy alcohol use previously.   MEDICAL HISTORY:  Past Medical History:  Diagnosis Date  . Acute kidney injury ([place]) 08/05/2016  . Aftercare following surgery of the circulatory system, [place] 05/11/2013  . AKI (acute kidney injury) ([place]) 08/04/2016  . Anxiety    takes Celexa daily  . ARF (acute renal failure) ([place]) 11/07/2016  . Asymptomatic stenosis of right carotid artery 03/22/2016  . Back pain    occasionally  . Carotid artery disease ([place]) 04/16/2013  . Carotid artery occlusion   . Carotid stenosis 11/16/2013  . Cataract    left and immature  . Coronary artery disease   . Coronary atherosclerosis of native coronary artery 03/11/2013   S/p CABG in 1997   . Depression   . Diabetes mellitus    takes Metformin and Glipizide daily  . Diabetes mellitus ([place]) 05/02/2015  . Dizziness    takes Meclizine daily as needed  . Essential hypertension, benign 03/11/2013  . GERD (gastroesophageal reflux disease)    takes Omeprazole daily as needed  . Headache(784.0)   . Hyperlipidemia    takes Atorvastatin daily  . Hypertension    takes Carvedilol daily  . Mixed hyperlipidemia 03/11/2013  . Muscle spasm    takes Robaxin daily as needed  . Nausea    takes Phenergan daily as needed  . Occlusion and stenosis of carotid artery without mention of cerebral infarction 07/19/2011  . Pneumonia    hx of-in high school  . Restless leg  takes Requip daily as needed  . Seasonal allergies    takes Claritin daily as needed and Afrin as needed  . Shortness of breath    with exertion  . Urinary urgency   . UTI (urinary tract infection) 11/07/2016    SURGICAL HISTORY: Past Surgical History:  Procedure Laterality Date  . COLONOSCOPY WITH PROPOFOL N/A 03/10/2012   Procedure: COLONOSCOPY WITH PROPOFOL;  Surgeon: Garlan Fair, MD;  Location: [facility];  Service: Endoscopy;  Laterality: N/A;  . CORNEAL  TRANSPLANT Right   . CORONARY ARTERY BYPASS GRAFT  1997   x 6  . CORONARY ARTERY BYPASS GRAFT  Jan. 1997  . ENDARTERECTOMY Left 04/16/2013   Procedure: Left Carotid Artery Endatarectomy with Resection of Redundant Internal Carotid Artery;  Surgeon: Rosetta Posner, MD;  Location: [facility];  Service: Vascular;  Laterality: Left;  . ENDARTERECTOMY Right 03/22/2016   Procedure: RIGHT ENDARTERECTOMY CAROTID;  Surgeon: Rosetta Posner, MD;  Location[facility];  Service: Vascular;  Laterality: Right;  . ESOPHAGOGASTRODUODENOSCOPY N/A 03/10/2012   Procedure: ESOPHAGOGASTRODUODENOSCOPY (EGD);  Surgeon: Garlan Fair, MD;  Location: [facility];  Service: Endoscopy;  Laterality: N/A;  . EYE SURGERY  March 12, 2001   CORNEA TRANSPLANT Right eye  . PATCH ANGIOPLASTY Right 03/22/2016   Procedure: PATCH ANGIOPLASTY;  Surgeon: Rosetta Posner, MD;  Location: [facility];  Service: Vascular;  Laterality: Right;  . PR VEIN BYPASS GRAFT,AORTO-FEM-POP  1997  . SPINE SURGERY  march 2013   Back surgery  . TONSILLECTOMY    . TRIGGER FINGER RELEASE Left    thumb    SOCIAL HISTORY: Social History   Socioeconomic History  . Marital status: Widowed    Spouse name: Not on file  . Number of children: Not on file  . Years of education: Not on file  . Highest education level: Not on file  Occupational History  . Not on file  Tobacco Use  . Smoking status: Never Smoker  . Smokeless tobacco: Never Used  Vaping Use  . Vaping Use: Never used  Substance and Sexual Activity  . Alcohol use: No    Alcohol/week: 0.0 standard drinks  . Drug use: No  . Sexual activity: Never    Birth control/protection: Post-menopausal  Other Topics Concern  . Not on file  Social History Narrative  . Not on file   Social Determinants of Health   Financial Resource Strain:   . Difficulty of Paying Living Expenses: Not on file  Food Insecurity:   . Worried About Charity fundraiser in the Last Year: Not on file  . Ran Out of Food in the Last  Year: Not on file  Transportation Needs:   . Lack of Transportation (Medical): Not on file  . Lack of Transportation (Non-Medical): Not on file  Physical Activity:   . Days of Exercise per Week: Not on file  . Minutes of Exercise per Session: Not on file  Stress:   . Feeling of Stress : Not on file  Social Connections:   . Frequency of Communication with Friends and Family: Not on file  . Frequency of Social Gatherings with Friends and Family: Not on file  . Attends Religious Services: Not on file  . Active Member of Clubs or Organizations: Not on file  . Attends Archivist Meetings: Not on file  . Marital Status: Not on file  Intimate Partner Violence:   . Fear of Current or Ex-Partner: Not on file  .  Emotionally Abused: Not on file  . Physically Abused: Not on file  . Sexually Abused: Not on file    FAMILY HISTORY: Family History  Problem Relation Age of Onset  . Heart attack Father   . Heart disease Father 2       Before age of 11  . Hyperlipidemia Father   . Heart disease Brother        Heart dissease before age 6  . Hyperlipidemia Brother   . Cirrhosis Mother   . Heart attack Paternal Grandfather   . Heart disease Paternal Grandfather   . Cancer Maternal Grandmother   . Alcoholism Maternal Grandfather   . Heart attack Paternal Grandmother     ALLERGIES:  is allergic to codeine, latex, morphine and related, cyclobenzaprine, and methocarbamol.  MEDICATIONS:  Current Outpatient Medications  Medication Sig Dispense Refill  . acetaminophen (TYLENOL) 325 MG tablet Take 650 mg by mouth every 6 (six) hours as needed for moderate pain.    Mar[place] Kitchen amLODipine (NORVASC) 5 MG tablet Take 1 tablet (5 mg total) by mouth daily. 30 tablet 0  . amoxicillin-clavulanate (AUGMENTIN) 875-125 MG tablet Take 1 tablet by mouth 2 (two) times daily. One po bid x 7 days 14 tablet 0  . Ascorbic Acid (VITAMIN C) 1000 MG tablet Take 1,000 mg by mouth daily.    Marland Kitchen aspirin EC 81 MG tablet  Take 81 mg by mouth every evening.     Marland Kitchen atorvastatin (LIPITOR) 40 MG tablet Take 40 mg by mouth daily at 6 PM.     . Calcium Carbonate-Vitamin D (CALTRATE 600+D PO) Take 1 tablet by mouth daily.    . carvedilol (COREG) 25 MG tablet Take 1 tablet (25 mg total) by mouth 2 (two) times daily. 180 tablet 3  . cholecalciferol (VITAMIN D3) 25 MCG (1000 UT) tablet Take 1,000 Units by mouth daily.    . citalopram (CELEXA) 20 MG tablet Take 20 mg by mouth every morning.     . clopidogrel (PLAVIX) 75 MG tablet Take 75 mg by mouth every morning.     . Cyanocobalamin (B-12) 2500 MCG TABS Take 2,500 mcg by mouth daily.    Marland Kitchen FERROUS SULFATE PO Take 325 mg by mouth daily.    . fish oil-omega-3 fatty acids 1000 MG capsule Take 1 g by mouth daily.    . furosemide (LASIX) 20 MG tablet Take 20 mg by mouth daily as needed for fluid or edema.     . gabapentin (NEURONTIN) 300 MG capsule Take 300 mg by mouth 4 (four) times daily.     Marland Kitchen glipiZIDE (GLUCOTROL XL) 10 MG 24 hr tablet Take 10 mg by mouth daily with breakfast.     . HYDROcodone-acetaminophen (NORCO) 10-325 MG tablet Take 1 tablet by mouth 2 (two) times daily as needed for severe pain.     Marland Kitchen ibuprofen (ADVIL,MOTRIN) 400 MG tablet Take 400 mg by mouth every 6 (six) hours as needed.    . meclizine (ANTIVERT) 25 MG tablet Take 25 mg by mouth 2 (two) times daily as needed for dizziness. For dizziness     . metFORMIN (GLUCOPHAGE) 1000 MG tablet Take 1,000 mg by mouth 2 (two) times daily with a meal.     . methocarbamol (ROBAXIN) 500 MG tablet Take 1 tablet (500 mg total) by mouth every 8 (eight) hours as needed for muscle spasms. 30 tablet 0  . nitroGLYCERIN (NITROSTAT) 0.4 MG SL tablet Place 1 tablet (0.4 mg total) under the tongue every  5 (five) minutes as needed. Chest pain. 25 tablet 5  . ofloxacin (OCUFLOX) 0.3 % ophthalmic solution Place 1 drop into the left eye 4 (four) times daily. (Patient not taking: Reported on 09/27/2019) 5 mL 0  . pantoprazole  (PROTONIX) 40 MG tablet Take 40 mg by mouth daily as needed (INDIGESTION).     Marland Kitchen prednisoLONE acetate (PRED FORTE) 1 % ophthalmic suspension Place 1 drop into the left eye 4 (four) times daily. (Patient not taking: Reported on 09/27/2019) 5 mL 0  . promethazine (PHENERGAN) 12.5 MG tablet Take 12.5 mg by mouth every 6 (six) hours as needed for nausea or vomiting.     No current facility-administered medications for this visit.    REVIEW OF SYSTEMS:    10 Point review of Systems was done is negative except as noted above.  PHYSICAL EXAMINATION: ECOG PERFORMANCE STATUS: 2 - Symptomatic, <50% confined to bed  . Vitals:   10/05/19 1309  BP: (!) 127/94  Pulse: 70  Resp: 18  Temp: 97.9 F (36.6 C)  SpO2: 95%   Filed Weights   10/05/19 1309  Weight: 170 lb 9.6 oz (77.4 kg)   .Body mass index is 28.39 kg/m.  Exam was given in a chair   GENERAL:alert, in no acute distress and comfortable SKIN: no acute rashes, no significant lesions EYES: conjunctiva are pink and non-injected, sclera anicteric OROPHARYNX: MMM, no exudates, no oropharyngeal erythema or ulceration NECK: supple, no JVD LYMPH:  no palpable lymphadenopathy in the cervical, axillary or inguinal regions LUNGS: clear to auscultation b/l with normal respiratory effort HEART: regular rate & rhythm ABDOMEN:  normoactive bowel sounds , non tender, not distended. Extremity: no pedal edema PSYCH: alert & oriented x 3 with fluent speech NEURO: no focal motor/sensory deficits  LABORATORY DATA:  I have reviewed the data as listed  . CBC Latest Ref Rng & Units 10/05/2019 12/22/2017 12/21/2017  WBC 4.0 - 10.5 K/uL 4.1 6.0 -  Hemoglobin 12.0 - 15.0 g/dL 14.2 11.2(L) 13.6  Hematocrit 36 - 46 % 42.3 35.0(L) 40.0  Platelets 150 - 400 K/uL 172 161 -   . CBC    Component Value Date/Time   WBC 4.1 10/05/2019 1427   RBC 3.82 (L) 10/05/2019 1427   RBC 3.89 10/05/2019 1427   HGB 14.2 10/05/2019 1427   HCT 42.3 10/05/2019 1427     HCT 35.3 11/08/2016 0516   PLT 172 10/05/2019 1427   MCV 110.7 (H) 10/05/2019 1427   MCH 37.2 (H) 10/05/2019 1427   MCHC 33.6 10/05/2019 1427   RDW 12.6 10/05/2019 1427   LYMPHSABS 1.0 10/05/2019 1427   MONOABS 0.3 10/05/2019 1427   EOSABS 0.1 10/05/2019 1427   BASOSABS 0.0 10/05/2019 1427     . CMP Latest Ref Rng & Units 10/05/2019 12/22/2017 12/21/2017  Glucose 70 - 99 mg/dL 162(H) 183(H) 142(H)  BUN 8 - 23 mg/dL 20 30(H) 45(H)  Creatinine 0.44 - 1.00 mg/dL 0.80 0.86 1.10(H)  Sodium 135 - 145 mmol/L 138 140 136  Potassium 3.5 - 5.1 mmol/L 4.4 4.8 4.9  Chloride 98 - 111 mmol/L 101 101 99  CO2 22 - 32 mmol/L 30 26 -  Calcium 8.9 - 10.3 mg/dL 9.9 9.3 -  Total Protein 6.5 - 8.1 g/dL 7.6 - -  Total Bilirubin 0.3 - 1.2 mg/dL 0.7 - -  Alkaline Phos 38 - 126 U/L 80 - -  AST 15 - 41 U/L 25 - -  ALT 0 - 44 U/L 19 - -  RADIOGRAPHIC STUDIES: I have personally reviewed the radiological images as listed and agreed with the findings in the report. OCT, Retina - OU - Both Eyes  Result Date: 09/27/2019 Right Eye Quality was good. Scan locations included subfoveal. Central Foveal Thickness: 215. Findings include normal foveal contour. Left Eye Quality was good. Scan locations included subfoveal. Central Foveal Thickness: 198. Findings include normal foveal contour. 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   ASSESSMENT & PLAN:   79 yo with h/o multiple medical co-morbidities referred for   1) Leukopenia and thrombocytopenia 2) RBC Macrocytosis PLAN: -Discussed patient's most recent labs from 09/15/2019, WBC & RBC were improved, PLT are borderline low, Vitamin B12 is well-replaced, Folate is WNL.  -Advised pt that her blood counts are already improving, not very concerning at this time.  -Advised pt that drop in WBC & RBC could be caused by medications.  -Advised pt that  nutritional deficiencies or a primary bone marrow disorder could also cause cytopenias.  -Recommend pt continue Vitamin K59 and Folic acid daily  -Will get labs today  -Will see back in 2 weeks via phone   FOLLOW UP: Labs today Phone visit with Dr Irene Limbo in 2 weeks  . Orders Placed This Encounter  Procedures  . CBC with Differential/Platelet    Standing Status:   Future    Number of Occurrences:   1    Standing Expiration Date:   10/04/2020  . CMP ([place] only)    Standing Status:   Future    Number of Occurrences:   1    Standing Expiration Date:   10/04/2020  . Lactate dehydrogenase    Standing Status:   Future    Number of Occurrences:   1    Standing Expiration Date:   10/04/2020  . Multiple Myeloma Panel (SPEP&IFE w/QIG)    Standing Status:   Future    Number of Occurrences:   1    Standing Expiration Date:   10/04/2020  . Kappa/lambda light chains    Standing Status:   Future    Number of Occurrences:   1    Standing Expiration Date:   10/04/2020  . Methylmalonic acid, serum    Standing Status:   Future    Number of Occurrences:   1    Standing Expiration Date:   10/04/2020  . Vitamin B12    Standing Status:   Future    Number of Occurrences:   1    Standing Expiration Date:   10/04/2020  . Reticulocytes    Standing Status:   Future    Number of Occurrences:   1    Standing Expiration Date:   10/04/2020  . Copper, serum    Standing Status:   Future    Number of Occurrences:   1    Standing Expiration Date:   10/04/2020    All of the patients questions were answered with apparent satisfaction. The patient knows to call the clinic with any problems, questions or concerns.  I spent 30 mins counseling the patient face to face. The total time spent in the appointment was 40 minutes and more than 50% was on counseling and direct patient cares.    Sullivan Lone MD [place] AAHIVMS [facility] Hematology/Oncology Physician [facility]  (Office):        [phone] (Work cell):  [phone] (Fax):  [phone]  10/05/2019 4:37 PM  I, Yevette Edwards, am acting as a scribe for Dr. Sullivan Lone.   .I have reviewed the above documentation for accuracy and completeness, and I agree with the above. Brunetta Genera MD

## 2019-10-06 LAB — KAPPA/LAMBDA LIGHT CHAINS
Kappa free light chain: 22.5 mg/L — ABNORMAL HIGH (ref 3.3–19.4)
Kappa, lambda light chain ratio: 1.21 (ref 0.26–1.65)
Lambda free light chains: 18.6 mg/L (ref 5.7–26.3)

## 2019-10-08 LAB — MULTIPLE MYELOMA PANEL, SERUM
Albumin SerPl Elph-Mcnc: 3.8 g/dL (ref 2.9–4.4)
Albumin/Glob SerPl: 1.2 (ref 0.7–1.7)
Alpha 1: 0.3 g/dL (ref 0.0–0.4)
Alpha2 Glob SerPl Elph-Mcnc: 0.9 g/dL (ref 0.4–1.0)
B-Globulin SerPl Elph-Mcnc: 1.3 g/dL (ref 0.7–1.3)
Gamma Glob SerPl Elph-Mcnc: 0.8 g/dL (ref 0.4–1.8)
Globulin, Total: 3.3 g/dL (ref 2.2–3.9)
IgA: 633 mg/dL — ABNORMAL HIGH (ref 64–422)
IgG (Immunoglobin G), Serum: 668 mg/dL (ref 586–1602)
IgM (Immunoglobulin M), Srm: 35 mg/dL (ref 26–217)
Total Protein ELP: 7.1 g/dL (ref 6.0–8.5)

## 2019-10-10 LAB — COPPER, SERUM: Copper: 89 ug/dL (ref 80–158)

## 2019-10-12 ENCOUNTER — Telehealth: Payer: Self-pay | Admitting: Hematology

## 2019-10-12 LAB — METHYLMALONIC ACID, SERUM: Methylmalonic Acid, Quantitative: 144 nmol/L (ref 0–378)

## 2019-10-12 NOTE — Telephone Encounter (Signed)
Scheduled per 09/28 los, patient has been called and voicemail was left. 

## 2019-10-20 ENCOUNTER — Inpatient Hospital Stay: Payer: Medicare Other | Attending: Hematology | Admitting: Hematology

## 2019-10-20 ENCOUNTER — Telehealth: Payer: Self-pay | Admitting: *Deleted

## 2019-10-20 NOTE — Telephone Encounter (Signed)
Dr. Irene Limbo unable to connect with patient for PHONE appt due to patient's poor phone connection. Patient was able to contact nurse desk 4 different times prior to her phone disconnecting.  Per Dr. Irene Limbo, shared the following with patient: Hgb/WBC and other blood counts are normalizing. Red blood cells are larger in size - may be due to age related changes in bone marrow production. Continue taking Y51, Folic Acid, and B complex as she has been. Return in 6 months to have labs done and see Dr. Irene Limbo. Information repeated several times due to poor phone connection. Patient requested copy of lab results mailed to her - printed, placed in envelope and placed in outgoing mail.

## 2019-10-26 NOTE — Progress Notes (Signed)
This encounter was created in error - please disregard.

## 2019-12-09 ENCOUNTER — Other Ambulatory Visit: Payer: Self-pay

## 2019-12-09 DIAGNOSIS — I6523 Occlusion and stenosis of bilateral carotid arteries: Secondary | ICD-10-CM

## 2019-12-15 ENCOUNTER — Ambulatory Visit (INDEPENDENT_AMBULATORY_CARE_PROVIDER_SITE_OTHER): Payer: Medicare Other | Admitting: Physician Assistant

## 2019-12-15 ENCOUNTER — Ambulatory Visit (HOSPITAL_COMMUNITY)
Admission: RE | Admit: 2019-12-15 | Discharge: 2019-12-15 | Disposition: A | Discharge status: Home / Self Care | Payer: Medicare Other | Source: Ambulatory Visit | Attending: Physician Assistant | Admitting: Physician Assistant

## 2019-12-15 ENCOUNTER — Other Ambulatory Visit: Payer: Self-pay

## 2019-12-15 VITALS — BP 119/67 | HR 58 | Temp 97.9°F | Resp 20 | Ht 65.0 in | Wt 179.6 lb

## 2019-12-15 DIAGNOSIS — I6523 Occlusion and stenosis of bilateral carotid arteries: Secondary | ICD-10-CM | POA: Insufficient documentation

## 2019-12-15 NOTE — Progress Notes (Signed)
History of Present Illness:  Patient is a 79 y.o. year old female who presents for evaluation of carotid stenosis.   S/P left CEA on 04/16/13 by Dr. Donnetta Hutching. She also has a history of right CEA in 2018.  The patient denies symptoms of TIA, amaurosis, or stroke.  The patient is currently on  Asa and Plavix antiplatelet therapy.    She is here today for f/u on carotid stenosis.  Past Medical History:  Diagnosis Date  . Acute kidney injury ([place]) 08/05/2016  . Aftercare following surgery of the circulatory system, [place] 05/11/2013  . AKI (acute kidney injury) ([place]) 08/04/2016  . Anxiety    takes Celexa daily  . ARF (acute renal failure) ([place]) 11/07/2016  . Asymptomatic stenosis of right carotid artery 03/22/2016  . Back pain    occasionally  . Carotid artery disease ([place]) 04/16/2013  . Carotid artery occlusion   . Carotid stenosis 11/16/2013  . Cataract    left and immature  . Coronary artery disease   . Coronary atherosclerosis of native coronary artery 03/11/2013   S/p CABG in 1997   . Depression   . Diabetes mellitus    takes Metformin and Glipizide daily  . Diabetes mellitus ([place]) 05/02/2015  . Dizziness    takes Meclizine daily as needed  . Essential hypertension, benign 03/11/2013  . GERD (gastroesophageal reflux disease)    takes Omeprazole daily as needed  . Headache(784.0)   . Hyperlipidemia    takes Atorvastatin daily  . Hypertension    takes Carvedilol daily  . Mixed hyperlipidemia 03/11/2013  . Muscle spasm    takes Robaxin daily as needed  . Nausea    takes Phenergan daily as needed  . Occlusion and stenosis of carotid artery without mention of cerebral infarction 07/19/2011  . Pneumonia    hx of-in high school  . Restless leg    takes Requip daily as needed  . Seasonal allergies    takes Claritin daily as needed and Afrin as needed  . Shortness of breath    with exertion  . Urinary urgency   . UTI (urinary tract infection) 11/07/2016    Past Surgical History:   Procedure Laterality Date  . COLONOSCOPY WITH PROPOFOL N/A 03/10/2012   Procedure: COLONOSCOPY WITH PROPOFOL;  Surgeon: Garlan Fair, MD;  Location: [facility];  Service: Endoscopy;  Laterality: N/A;  . CORNEAL TRANSPLANT Right   . CORONARY ARTERY BYPASS GRAFT  1997   x 6  . CORONARY ARTERY BYPASS GRAFT  Jan. 1997  . ENDARTERECTOMY Left 04/16/2013   Procedure: Left Carotid Artery Endatarectomy with Resection of Redundant Internal Carotid Artery;  Surgeon: Rosetta Posner, MD;  Location: [facility];  Service: Vascular;  Laterality: Left;  . ENDARTERECTOMY Right 03/22/2016   Procedure: RIGHT ENDARTERECTOMY CAROTID;  Surgeon: Rosetta Posner, MD;  Location: [facility] OR;  Service: Vascular;  Laterality: Right;  . ESOPHAGOGASTRODUODENOSCOPY N/A 03/10/2012   Procedure: ESOPHAGOGASTRODUODENOSCOPY (EGD);  Surgeon: Garlan Fair, MD;  Location: [facility];  Service: Endoscopy;  Laterality: N/A;  . EYE SURGERY  March 12, 2001   CORNEA TRANSPLANT Right eye  . PATCH ANGIOPLASTY Right 03/22/2016   Procedure: PATCH ANGIOPLASTY;  Surgeon: Rosetta Posner, MD;  Location: [place];  Service: Vascular;  Laterality: Right;  . PR VEIN BYPASS GRAFT,AORTO-FEM-POP  1997  . SPINE SURGERY  march 2013   Back surgery  . TONSILLECTOMY    . TRIGGER FINGER RELEASE Left  thumb     Social History Social History   Tobacco Use  . Smoking status: Never Smoker  . Smokeless tobacco: Never Used  Vaping Use  . Vaping Use: Never used  Substance Use Topics  . Alcohol use: No    Alcohol/week: 0.0 standard drinks  . Drug use: No    Family History Family History  Problem Relation Age of Onset  . Heart attack Father   . Heart disease Father 78       Before age of 67  . Hyperlipidemia Father   . Heart disease Brother        Heart dissease before age 65  . Hyperlipidemia Brother   . Cirrhosis Mother   . Heart attack Paternal Grandfather   . Heart disease Paternal Grandfather   . Cancer Maternal Grandmother   . Alcoholism  Maternal Grandfather   . Heart attack Paternal Grandmother     Allergies  Allergies  Allergen Reactions  . Codeine Other (See Comments)    Abnormal behavior  . Latex Other (See Comments)    tears skin  . Morphine And Related Other (See Comments)    Affects BP and blood sugar.  . Cyclobenzaprine     MADE PT SICK- pt unsure if med was cyclobenzaprine or methocarbamol   . Methocarbamol Other (See Comments)    MADE PT SICK- pt unsure if med was cyclobenzaprine or methocarbamol      Current Outpatient Medications  Medication Sig Dispense Refill  . acetaminophen (TYLENOL) 325 MG tablet Take 650 mg by mouth every 6 (six) hours as needed for moderate pain.    Marland Kitchen amLODipine (NORVASC) 5 MG tablet Take 1 tablet (5 mg total) by mouth daily. 30 tablet 0  . Ascorbic Acid (VITAMIN C) 1000 MG tablet Take 1,000 mg by mouth daily.    Marland Kitchen aspirin EC 81 MG tablet Take 81 mg by mouth every evening.     . Calcium Carbonate-Vitamin D (CALTRATE 600+D PO) Take 1 tablet by mouth daily.    . carvedilol (COREG) 25 MG tablet Take 1 tablet (25 mg total) by mouth 2 (two) times daily. 180 tablet 3  . cholecalciferol (VITAMIN D3) 25 MCG (1000 UT) tablet Take 1,000 Units by mouth daily.    . citalopram (CELEXA) 20 MG tablet Take 20 mg by mouth every morning.     . clopidogrel (PLAVIX) 75 MG tablet Take 75 mg by mouth every morning.     . Cyanocobalamin (B-12) 2500 MCG TABS Take 2,500 mcg by mouth daily.    Marland Kitchen FERROUS SULFATE PO Take 325 mg by mouth daily.    . fish oil-omega-3 fatty acids 1000 MG capsule Take 1 g by mouth daily.    . furosemide (LASIX) 20 MG tablet Take 20 mg by mouth daily as needed for fluid or edema.     . gabapentin (NEURONTIN) 300 MG capsule Take 300 mg by mouth 4 (four) times daily.     Marland Kitchen glipiZIDE (GLUCOTROL XL) 10 MG 24 hr tablet Take 10 mg by mouth daily with breakfast.     . HYDROcodone-acetaminophen (NORCO) 10-325 MG tablet Take 1 tablet by mouth 2 (two) times daily as needed for severe  pain.     Marland Kitchen ibuprofen (ADVIL,MOTRIN) 400 MG tablet Take 400 mg by mouth every 6 (six) hours as needed.    Marland Kitchen lisinopril (ZESTRIL) 5 MG tablet Take 5 mg by mouth daily.    . Magnesium Oxide 250 MG TABS Take 1 tablet by mouth  daily.    . meclizine (ANTIVERT) 25 MG tablet Take 25 mg by mouth 2 (two) times daily as needed for dizziness. For dizziness     . metFORMIN (GLUCOPHAGE) 1000 MG tablet Take 1,000 mg by mouth 2 (two) times daily with a meal.     . methocarbamol (ROBAXIN) 500 MG tablet Take 1 tablet (500 mg total) by mouth every 8 (eight) hours as needed for muscle spasms. 30 tablet 0  . nitroGLYCERIN (NITROSTAT) 0.4 MG SL tablet Place 1 tablet (0.4 mg total) under the tongue every 5 (five) minutes as needed. Chest pain. 25 tablet 5  . ofloxacin (OCUFLOX) 0.3 % ophthalmic solution Place 1 drop into the left eye 4 (four) times daily. 5 mL 0  . pantoprazole (PROTONIX) 40 MG tablet Take 40 mg by mouth daily as needed (INDIGESTION).     Marland Kitchen prednisoLONE acetate (PRED FORTE) 1 % ophthalmic suspension Place 1 drop into the left eye 4 (four) times daily. 5 mL 0  . promethazine (PHENERGAN) 12.5 MG tablet Take 12.5 mg by mouth every 6 (six) hours as needed for nausea or vomiting.    . rosuvastatin (CRESTOR) 5 MG tablet Take 5 mg by mouth daily. Takes on Monday and Friday     No current facility-administered medications for this visit.    ROS:   General:  No weight loss, Fever, chills  HEENT: No recent headaches, no nasal bleeding, positive retinal repair left eye with visual changes, no sore throat  Neurologic: No dizziness, blackouts, seizures. No recent symptoms of stroke or mini- stroke. No recent episodes of slurred speech, or temporary blindness.  Cardiac: No recent episodes of chest pain/pressure, no shortness of breath at rest.  No shortness of breath with exertion.  Denies history of atrial fibrillation or irregular heartbeat  Vascular: No history of rest pain in feet.  No history of  claudication.  No history of non-healing ulcer, No history of DVT   Pulmonary: No home oxygen, no productive cough, no hemoptysis,  No asthma or wheezing  Musculoskeletal:  [ ]  Arthritis, [ ]  Low back pain,  [x ] Joint pain  Hematologic:No history of hypercoagulable state.  No history of easy bleeding.  No history of anemia  Gastrointestinal: No hematochezia or melena,  No gastroesophageal reflux, no trouble swallowing  Urinary: [ ]  chronic Kidney disease, [ ]  on HD - [ ]  MWF or [ ]  TTHS, [ ]  Burning with urination, [ ]  Frequent urination, [ ]  Difficulty urinating;   Skin: No rashes  Psychological: No history of anxiety,  No history of depression   Physical Examination  Vitals:   12/15/19 1425 12/15/19 1431  BP: 118/70 119/67  Pulse: (!) 58   Resp: 20   Temp: 97.9 F (36.6 C)   TempSrc: Temporal   SpO2: 94%   Weight: 179 lb 9.6 oz (81.5 kg)   Height: 5\' 5"  (1.651 m)     Body mass index is 29.89 kg/m.  General:  Alert and oriented, no acute distress HEENT: Normal Neck: No bruit or JVD Pulmonary: Clear to auscultation bilaterally Cardiac: Regular Rate and Rhythm without murmur Gastrointestinal: Soft, non-tender, non-distended, no mass, no scars Skin: No rash Extremity Pulses:  2+ radial  pulses bilaterally Musculoskeletal: No deformity or edema  Neurologic: Upper and lower extremity motor grossly symmetric  DATA:     Right Carotid Findings:  +----------+--------+--------+--------+------------------+--------+       PSV cm/sEDV cm/sStenosisPlaque DescriptionComments  +----------+--------+--------+--------+------------------+--------+  CCA Prox 64   19                      +----------+--------+--------+--------+------------------+--------+  CCA Mid  67   21                      +----------+--------+--------+--------+------------------+--------+  CCA Distal58   16                       +----------+--------+--------+--------+------------------+--------+  ICA Prox 69   22       heterogenous         +----------+--------+--------+--------+------------------+--------+  ICA Mid  111   37   1-39%  heterogenous         +----------+--------+--------+--------+------------------+--------+  ICA Distal100   40                      +----------+--------+--------+--------+------------------+--------+  ECA    244   30       heterogenous         +----------+--------+--------+--------+------------------+--------+   +----------+--------+-------+----------------+-------------------+       PSV cm/sEDV cmsDescribe    Arm Pressure (mmHG)  +----------+--------+-------+----------------+-------------------+  [id number]       Multiphasic, WNL            +----------+--------+-------+----------------+-------------------+   +---------+--------+--+--------+--+---------+  VertebralPSV cm/s64EDV cm/s21Antegrade  +---------+--------+--+--------+--+---------+      Left Carotid Findings:  +----------+--------+--------+--------+------------------+--------+       PSV cm/sEDV cm/sStenosisPlaque DescriptionComments  +----------+--------+--------+--------+------------------+--------+  CCA Prox 87   31                      +----------+--------+--------+--------+------------------+--------+  CCA Mid  88   25       heterogenous         +----------+--------+--------+--------+------------------+--------+  CCA Distal74   16       heterogenous         +----------+--------+--------+--------+------------------+--------+  ICA Prox 86   36       heterogenous         +----------+--------+--------+--------+------------------+--------+   ICA Mid  218   52   40-59%                +----------+--------+--------+--------+------------------+--------+  ICA Distal110   33                      +----------+--------+--------+--------+------------------+--------+  ECA    146   19                      +----------+--------+--------+--------+------------------+--------+   +----------+--------+--------+----------------+-------------------+       PSV cm/sEDV cm/sDescribe    Arm Pressure (mmHG)  +----------+--------+--------+----------------+-------------------+  [id number]       Multiphasic, WNL            +----------+--------+--------+----------------+-------------------+   +---------+--------+--+--------+--+---------+  VertebralPSV cm/s56EDV cm/s24Antegrade  +---------+--------+--+--------+--+---------+  Summary:  Right Carotid: Velocities in the right ICA are consistent with a 1-39%  stenosis.   Left Carotid: Velocities in the left ICA are consistent with a 40-59%  stenosis.   Vertebrals: Bilateral vertebral arteries demonstrate antegrade flow.  Subclavians: Normal flow hemodynamics were seen in bilateral subclavian        arteries.   ASSESSMENT:  Carotid stenosis s/p Bilateral CEA She is currently asymptomatic of stroke/TIA The carotid duplex demonstrates <39% stenosis on the right ICA and 40-59% stenosis on the left ICA with PSV of 218.  There is no significant change in the duplex compared to last duplex.    PLAN: Continue daily activity as tolerates.  Continue ASA, Plavix and Statin daily.  F/U in 1 year for repeat carotid duplex.  We reviewed symptoms of stroke and TIA if these occur she will call 911.    Roxy Horseman PA-C [facility] Office: [phone]  MD on call Donzetta Matters

## 2019-12-16 ENCOUNTER — Encounter: Payer: Self-pay | Admitting: Physician Assistant

## 2020-01-11 DIAGNOSIS — R296 Repeated falls: Secondary | ICD-10-CM | POA: Diagnosis not present

## 2020-01-11 DIAGNOSIS — E119 Type 2 diabetes mellitus without complications: Secondary | ICD-10-CM | POA: Diagnosis not present

## 2020-01-11 DIAGNOSIS — I10 Essential (primary) hypertension: Secondary | ICD-10-CM | POA: Diagnosis not present

## 2020-01-12 DIAGNOSIS — M5416 Radiculopathy, lumbar region: Secondary | ICD-10-CM | POA: Diagnosis not present

## 2020-01-12 DIAGNOSIS — Z79899 Other long term (current) drug therapy: Secondary | ICD-10-CM | POA: Diagnosis not present

## 2020-01-12 DIAGNOSIS — M4316 Spondylolisthesis, lumbar region: Secondary | ICD-10-CM | POA: Diagnosis not present

## 2020-01-13 ENCOUNTER — Encounter: Payer: Self-pay | Admitting: Interventional Cardiology

## 2020-01-13 ENCOUNTER — Ambulatory Visit: Payer: Medicare Other | Admitting: Interventional Cardiology

## 2020-01-13 ENCOUNTER — Other Ambulatory Visit: Payer: Self-pay

## 2020-01-13 VITALS — BP 100/50 | HR 68 | Ht 65.0 in | Wt 178.0 lb

## 2020-01-13 DIAGNOSIS — I25119 Atherosclerotic heart disease of native coronary artery with unspecified angina pectoris: Secondary | ICD-10-CM

## 2020-01-13 DIAGNOSIS — I10 Essential (primary) hypertension: Secondary | ICD-10-CM

## 2020-01-13 DIAGNOSIS — I5032 Chronic diastolic (congestive) heart failure: Secondary | ICD-10-CM | POA: Diagnosis not present

## 2020-01-13 DIAGNOSIS — I6523 Occlusion and stenosis of bilateral carotid arteries: Secondary | ICD-10-CM

## 2020-01-13 DIAGNOSIS — E1159 Type 2 diabetes mellitus with other circulatory complications: Secondary | ICD-10-CM

## 2020-01-13 NOTE — Patient Instructions (Signed)
Medication Instructions:  Your physician recommends that you continue on your current medications as directed. Please refer to the Current Medication list given to you today.  *If you need a refill on your cardiac medications before your next appointment, please call your pharmacy*   Lab Work: None  If you have labs (blood work) drawn today and your tests are completely normal, you will receive your results only by: . MyChart Message (if you have MyChart) OR . A paper copy in the mail If you have any lab test that is abnormal or we need to change your treatment, we will call you to review the results.   Testing/Procedures: None   Follow-Up: At [facility], you and your health needs are our priority.  As part of our continuing mission to provide you with exceptional heart care, we have created designated Provider Care Teams.  These Care Teams include your primary Cardiologist (physician) and Advanced Practice Providers (APPs -  Physician Assistants and Nurse Practitioners) who all work together to provide you with the care you need, when you need it.  We recommend signing up for the patient portal called "MyChart".  Sign up information is provided on this After Visit Summary.  MyChart is used to connect with patients for Virtual Visits (Telemedicine).  Patients are able to view lab/test results, encounter notes, upcoming appointments, etc.  Non-urgent messages can be sent to your provider as well.   To learn more about what you can do with MyChart, go to [email or web address].    Your next appointment:   12 month(s)  The format for your next appointment:   In Person  Provider:   You may see Jay Varanasi, MD or one of the following Advanced Practice Providers on your designated Care Team:    Dayna Dunn, PA-C  Michele Lenze, PA-C    Other Instructions None  

## 2020-01-13 NOTE — Progress Notes (Signed)
Cardiology Office Note   Date:  01/13/2020   ID:  Andrea Kirk, DOB 1940/12/08, MRN [id number]  PCP:  Andrea Screws, MD    No chief complaint on file.  CAD  Wt Readings from Last 3 Encounters:  01/13/20 178 lb (80.7 kg)  12/15/19 179 lb 9.6 oz (81.5 kg)  10/05/19 170 lb 9.6 oz (77.4 kg)       History of Present Illness: Andrea Kirk is a 80 y.o. female    who had CABG in 1997. She suffered a fall in 2014 due to her knee giving way. She did not pass out at that time. She had significant bruising on her face at that time.   She worked at [employer] on [address]. She works only between 7A-7P for 4-5 hours at a time due to leg problems.   She had a right CEA in 2018. She had several fallsafterthe surgery. She has broken her right wrist.   She subsequently broke her collar bone but did not require surgery.   In 10/19, she had a UTI. She then was on an antibiotic and had leg swelling. She was started on Lasix. Her weight was up 14 lbs at that time. It came down with diuresis.   She moved to a senior community in 11/19, to [employer] [facility].  She had some swelling before the move as well. It resolved with Lasix.   Last May 2021, she was walking in to [employer] to get her meds, she fell face first.  She did not pass out.  She just fell and broke her left wrist and right knee and some ribs.  She lost 2 teeth as well.  She fell in 1/22 at [employer] when she did not use her cane and was "holding on to the wall."  She wants to get some physical therapy.   She has had some issues with anemia.  THought to be age related and vitamin supplements recommended.   She was started on lisinopril due to protein in urine.   Denies : Chest pain.  Nitroglycerin use. Orthopnea. Palpitations. Paroxysmal nocturnal dyspnea. Shortness of breath. Syncope.    Past Medical History:  Diagnosis Date  . Acute kidney injury (HCC) 08/05/2016  . Aftercare following surgery of the  circulatory system, [facility] 05/11/2013  . AKI (acute kidney injury) (HCC) 08/04/2016  . Anxiety    takes Celexa daily  . ARF (acute renal failure) (HCC) 11/07/2016  . Asymptomatic stenosis of right carotid artery 03/22/2016  . Back pain    occasionally  . Carotid artery disease (HCC) 04/16/2013  . Carotid artery occlusion   . Carotid stenosis 11/16/2013  . Cataract    left and immature  . Coronary artery disease   . Coronary atherosclerosis of native coronary artery 03/11/2013   S/p CABG in 1997   . Depression   . Diabetes mellitus    takes Metformin and Glipizide daily  . Diabetes mellitus (HCC) 05/02/2015  . Dizziness    takes Meclizine daily as needed  . Essential hypertension, benign 03/11/2013  . GERD (gastroesophageal reflux disease)    takes Omeprazole daily as needed  . Headache(784.0)   . Hyperlipidemia    takes Atorvastatin daily  . Hypertension    takes Carvedilol daily  . Mixed hyperlipidemia 03/11/2013  . Muscle spasm    takes Robaxin daily as needed  . Nausea    takes Phenergan daily as needed  . Occlusion and stenosis of carotid artery  without mention of cerebral infarction 07/19/2011  . Pneumonia    hx of-in high school  . Restless leg    takes Requip daily as needed  . Seasonal allergies    takes Claritin daily as needed and Afrin as needed  . Shortness of breath    with exertion  . Urinary urgency   . UTI (urinary tract infection) 11/07/2016    Past Surgical History:  Procedure Laterality Date  . COLONOSCOPY WITH PROPOFOL N/A 03/10/2012   Procedure: COLONOSCOPY WITH PROPOFOL;  Surgeon: Garlan Fair, MD;  Location: [facility];  Service: Endoscopy;  Laterality: N/A;  . CORNEAL TRANSPLANT Right   . CORONARY ARTERY BYPASS GRAFT  1997   x 6  . CORONARY ARTERY BYPASS GRAFT  Jan. 1997  . ENDARTERECTOMY Left 04/16/2013   Procedure: Left Carotid Artery Endatarectomy with Resection of Redundant Internal Carotid Artery;  Surgeon: Rosetta Posner, MD;  Location: [place];   Service: Vascular;  Laterality: Left;  . ENDARTERECTOMY Right 03/22/2016   Procedure: RIGHT ENDARTERECTOMY CAROTID;  Surgeon: Rosetta Posner, MD;  Location[facility];  Service: Vascular;  Laterality: Right;  . ESOPHAGOGASTRODUODENOSCOPY N/A 03/10/2012   Procedure: ESOPHAGOGASTRODUODENOSCOPY (EGD);  Surgeon: Garlan Fair, MD;  Location: [facility];  Service: Endoscopy;  Laterality: N/A;  . EYE SURGERY  March 12, 2001   CORNEA TRANSPLANT Right eye  . PATCH ANGIOPLASTY Right 03/22/2016   Procedure: PATCH ANGIOPLASTY;  Surgeon: Rosetta Posner, MD;  Location: [place];  Service: Vascular;  Laterality: Right;  . PR VEIN BYPASS GRAFT,AORTO-FEM-POP  1997  . SPINE SURGERY  march 2013   Back surgery  . TONSILLECTOMY    . TRIGGER FINGER RELEASE Left    thumb     Current Outpatient Medications  Medication Sig Dispense Refill  . acetaminophen (TYLENOL) 325 MG tablet Take 650 mg by mouth every 6 (six) hours as needed for moderate pain.    Marland Kitchen amLODipine (NORVASC) 5 MG tablet Take 1 tablet (5 mg total) by mouth daily. 30 tablet 0  . Ascorbic Acid (VITAMIN C) 1000 MG tablet Take 1,000 mg by mouth daily.    Marland Kitchen aspirin EC 81 MG tablet Take 81 mg by mouth every evening.     . Calcium Carbonate-Vitamin D (CALTRATE 600+D PO) Take 1 tablet by mouth daily.    . carvedilol (COREG) 25 MG tablet Take 1 tablet (25 mg total) by mouth 2 (two) times daily. 180 tablet 3  . cholecalciferol (VITAMIN D3) 25 MCG (1000 UT) tablet Take 1,000 Units by mouth daily.    . citalopram (CELEXA) 20 MG tablet Take 20 mg by mouth every morning.     . clopidogrel (PLAVIX) 75 MG tablet Take 75 mg by mouth every morning.     . Cyanocobalamin (B-12) 2500 MCG TABS Take 2,500 mcg by mouth daily.    Marland Kitchen FERROUS SULFATE PO Take 325 mg by mouth daily.    . fish oil-omega-3 fatty acids 1000 MG capsule Take 1 g by mouth daily.    . furosemide (LASIX) 20 MG tablet Take 20 mg by mouth daily as needed for fluid or edema.     . gabapentin (NEURONTIN) 300 MG  capsule Take 300 mg by mouth 4 (four) times daily.     Marland Kitchen glipiZIDE (GLUCOTROL XL) 10 MG 24 hr tablet Take 10 mg by mouth daily with breakfast.     . HYDROcodone-acetaminophen (NORCO) 10-325 MG tablet Take 1 tablet by mouth 2 (two) times daily as needed  for severe pain.     Marland Kitchen lisinopril (ZESTRIL) 5 MG tablet Take 5 mg by mouth daily.    . Magnesium Oxide 250 MG TABS Take 1 tablet by mouth daily.    . meclizine (ANTIVERT) 25 MG tablet Take 25 mg by mouth 2 (two) times daily as needed for dizziness. For dizziness    . methocarbamol (ROBAXIN) 500 MG tablet Take 1 tablet (500 mg total) by mouth every 8 (eight) hours as needed for muscle spasms. 30 tablet 0  . nitroGLYCERIN (NITROSTAT) 0.4 MG SL tablet Place 1 tablet (0.4 mg total) under the tongue every 5 (five) minutes as needed. Chest pain. 25 tablet 5  . pantoprazole (PROTONIX) 40 MG tablet Take 40 mg by mouth daily as needed (INDIGESTION).     Marland Kitchen promethazine (PHENERGAN) 12.5 MG tablet Take 12.5 mg by mouth every 6 (six) hours as needed for nausea or vomiting.    . rosuvastatin (CRESTOR) 5 MG tablet Take 5 mg by mouth daily. Takes on Monday and Friday     No current facility-administered medications for this visit.    Allergies:   Codeine, Latex, Morphine and related, Cyclobenzaprine, and Methocarbamol    Social History:  The patient  reports that she has never smoked. She has never used smokeless tobacco. She reports that she does not drink alcohol and does not use drugs.   Family History:  The patient's family history includes Alcoholism in her maternal grandfather; Cancer in her maternal grandmother; Cirrhosis in her mother; Heart attack in her father, paternal grandfather, and paternal grandmother; Heart disease in her brother and paternal grandfather; Heart disease (age of onset: 3) in her father; Hyperlipidemia in her brother and father.    ROS:  Please see the history of present illness.   Otherwise, review of systems are positive for  falls.   All other systems are reviewed and negative.    PHYSICAL EXAM: VS:  BP (!) 100/50   Pulse 68   Ht 5\' 5"  (1.651 m)   Wt 178 lb (80.7 kg)   SpO2 94%   BMI 29.62 kg/m  , BMI Body mass index is 29.62 kg/m. GEN: Well nourished, well developed, in no acute distress  HEENT: normal  Neck: no JVD, carotid bruits, or masses Cardiac: RRR; no murmurs, rubs, or gallops,no edema  Respiratory:  clear to auscultation bilaterally, normal work of breathing GI: soft, nontender, nondistended, + BS MS: no deformity or atrophy ; left arm bruise Skin: warm and dry, no rash Neuro:  Strength and sensation are intact Psych: euthymic mood, full affect   EKG:   The ekg ordered 5/21 demonstrates NSR, nonspecific lateral ST changes   Recent Labs: 10/05/2019: ALT 19; BUN 20; Creatinine 0.80; Hemoglobin 14.2; Platelets 172; Potassium 4.4; Sodium 138   Lipid Panel No results found for: CHOL, TRIG, HDL, CHOLHDL, VLDL, LDLCALC, LDLDIRECT   Other studies Reviewed: Additional studies/ records that were reviewed today with results demonstrating: labs reviewed.A1C 6.0 in Jan 22, LDL 22 in 8/21- done at [facility].   ASSESSMENT AND PLAN:  1. CAD: No angina.  Continue aggressive secondary prevention. Continue rosuvastatin.  Whole food, plant based diet recommended.  Could stop aspirin and just use clopidogrel monotherapy from a cardiac standpoint. 2. CArotid artery disease: Moderate left sided disease.  S/p CEA on right. Continue regular screening with Dopplers.  3. Falls: I stressed the importance of avoiding falling.  I encouraged her to use her walker. 4. DM: Managed by PMD.  High fiber  diet would be helpful.  5. HTN: The current medical regimen is effective;  continue present plan and medications. 6. Chronic diastolic heart failure: no obvious volume overload.    Current medicines are reviewed at length with the patient today.  The patient concerns regarding her medicines were addressed.  The  following changes have been made:  No change  Labs/ tests ordered today include:  No orders of the defined types were placed in this encounter.   Recommend 150 minutes/week of aerobic exercise Low fat, low carb, high fiber diet recommended  Disposition:   FU in 1 year   Signed, Larae Grooms, MD  01/13/2020 10:00 AM    [address], [place], [place]  [address] Phone: [phone]; Fax: [phone]

## 2020-01-17 DIAGNOSIS — H18602 Keratoconus, unspecified, left eye: Secondary | ICD-10-CM | POA: Diagnosis not present

## 2020-01-17 DIAGNOSIS — E119 Type 2 diabetes mellitus without complications: Secondary | ICD-10-CM | POA: Diagnosis not present

## 2020-01-17 DIAGNOSIS — H52203 Unspecified astigmatism, bilateral: Secondary | ICD-10-CM | POA: Diagnosis not present

## 2020-01-17 DIAGNOSIS — Z947 Corneal transplant status: Secondary | ICD-10-CM | POA: Diagnosis not present

## 2020-01-18 DIAGNOSIS — G894 Chronic pain syndrome: Secondary | ICD-10-CM | POA: Diagnosis not present

## 2020-01-18 DIAGNOSIS — E785 Hyperlipidemia, unspecified: Secondary | ICD-10-CM | POA: Diagnosis not present

## 2020-01-18 DIAGNOSIS — K219 Gastro-esophageal reflux disease without esophagitis: Secondary | ICD-10-CM | POA: Diagnosis not present

## 2020-01-18 DIAGNOSIS — K589 Irritable bowel syndrome without diarrhea: Secondary | ICD-10-CM | POA: Diagnosis not present

## 2020-01-18 DIAGNOSIS — I2510 Atherosclerotic heart disease of native coronary artery without angina pectoris: Secondary | ICD-10-CM | POA: Diagnosis not present

## 2020-01-18 DIAGNOSIS — E7800 Pure hypercholesterolemia, unspecified: Secondary | ICD-10-CM | POA: Diagnosis not present

## 2020-01-18 DIAGNOSIS — M519 Unspecified thoracic, thoracolumbar and lumbosacral intervertebral disc disorder: Secondary | ICD-10-CM | POA: Diagnosis not present

## 2020-01-18 DIAGNOSIS — M150 Primary generalized (osteo)arthritis: Secondary | ICD-10-CM | POA: Diagnosis not present

## 2020-01-18 DIAGNOSIS — I10 Essential (primary) hypertension: Secondary | ICD-10-CM | POA: Diagnosis not present

## 2020-01-18 DIAGNOSIS — E1151 Type 2 diabetes mellitus with diabetic peripheral angiopathy without gangrene: Secondary | ICD-10-CM | POA: Diagnosis not present

## 2020-01-20 DIAGNOSIS — M519 Unspecified thoracic, thoracolumbar and lumbosacral intervertebral disc disorder: Secondary | ICD-10-CM | POA: Diagnosis not present

## 2020-01-20 DIAGNOSIS — M150 Primary generalized (osteo)arthritis: Secondary | ICD-10-CM | POA: Diagnosis not present

## 2020-01-20 DIAGNOSIS — I10 Essential (primary) hypertension: Secondary | ICD-10-CM | POA: Diagnosis not present

## 2020-01-20 DIAGNOSIS — I2510 Atherosclerotic heart disease of native coronary artery without angina pectoris: Secondary | ICD-10-CM | POA: Diagnosis not present

## 2020-01-20 DIAGNOSIS — E1151 Type 2 diabetes mellitus with diabetic peripheral angiopathy without gangrene: Secondary | ICD-10-CM | POA: Diagnosis not present

## 2020-01-22 ENCOUNTER — Other Ambulatory Visit: Payer: Self-pay | Admitting: Interventional Cardiology

## 2020-01-26 DIAGNOSIS — E1151 Type 2 diabetes mellitus with diabetic peripheral angiopathy without gangrene: Secondary | ICD-10-CM | POA: Diagnosis not present

## 2020-01-26 DIAGNOSIS — I2510 Atherosclerotic heart disease of native coronary artery without angina pectoris: Secondary | ICD-10-CM | POA: Diagnosis not present

## 2020-01-26 DIAGNOSIS — M519 Unspecified thoracic, thoracolumbar and lumbosacral intervertebral disc disorder: Secondary | ICD-10-CM | POA: Diagnosis not present

## 2020-01-26 DIAGNOSIS — M150 Primary generalized (osteo)arthritis: Secondary | ICD-10-CM | POA: Diagnosis not present

## 2020-01-26 DIAGNOSIS — I10 Essential (primary) hypertension: Secondary | ICD-10-CM | POA: Diagnosis not present

## 2020-01-28 DIAGNOSIS — I10 Essential (primary) hypertension: Secondary | ICD-10-CM | POA: Diagnosis not present

## 2020-01-28 DIAGNOSIS — E1151 Type 2 diabetes mellitus with diabetic peripheral angiopathy without gangrene: Secondary | ICD-10-CM | POA: Diagnosis not present

## 2020-01-28 DIAGNOSIS — I2510 Atherosclerotic heart disease of native coronary artery without angina pectoris: Secondary | ICD-10-CM | POA: Diagnosis not present

## 2020-01-28 DIAGNOSIS — M150 Primary generalized (osteo)arthritis: Secondary | ICD-10-CM | POA: Diagnosis not present

## 2020-01-28 DIAGNOSIS — M519 Unspecified thoracic, thoracolumbar and lumbosacral intervertebral disc disorder: Secondary | ICD-10-CM | POA: Diagnosis not present

## 2020-02-04 DIAGNOSIS — I2510 Atherosclerotic heart disease of native coronary artery without angina pectoris: Secondary | ICD-10-CM | POA: Diagnosis not present

## 2020-02-04 DIAGNOSIS — E1151 Type 2 diabetes mellitus with diabetic peripheral angiopathy without gangrene: Secondary | ICD-10-CM | POA: Diagnosis not present

## 2020-02-04 DIAGNOSIS — M150 Primary generalized (osteo)arthritis: Secondary | ICD-10-CM | POA: Diagnosis not present

## 2020-02-04 DIAGNOSIS — M519 Unspecified thoracic, thoracolumbar and lumbosacral intervertebral disc disorder: Secondary | ICD-10-CM | POA: Diagnosis not present

## 2020-02-04 DIAGNOSIS — I10 Essential (primary) hypertension: Secondary | ICD-10-CM | POA: Diagnosis not present

## 2020-02-10 DIAGNOSIS — E1151 Type 2 diabetes mellitus with diabetic peripheral angiopathy without gangrene: Secondary | ICD-10-CM | POA: Diagnosis not present

## 2020-02-10 DIAGNOSIS — I10 Essential (primary) hypertension: Secondary | ICD-10-CM | POA: Diagnosis not present

## 2020-02-10 DIAGNOSIS — I2510 Atherosclerotic heart disease of native coronary artery without angina pectoris: Secondary | ICD-10-CM | POA: Diagnosis not present

## 2020-02-10 DIAGNOSIS — M150 Primary generalized (osteo)arthritis: Secondary | ICD-10-CM | POA: Diagnosis not present

## 2020-02-10 DIAGNOSIS — M519 Unspecified thoracic, thoracolumbar and lumbosacral intervertebral disc disorder: Secondary | ICD-10-CM | POA: Diagnosis not present

## 2020-02-14 DIAGNOSIS — I10 Essential (primary) hypertension: Secondary | ICD-10-CM | POA: Diagnosis not present

## 2020-02-14 DIAGNOSIS — M519 Unspecified thoracic, thoracolumbar and lumbosacral intervertebral disc disorder: Secondary | ICD-10-CM | POA: Diagnosis not present

## 2020-02-14 DIAGNOSIS — I2510 Atherosclerotic heart disease of native coronary artery without angina pectoris: Secondary | ICD-10-CM | POA: Diagnosis not present

## 2020-02-14 DIAGNOSIS — M150 Primary generalized (osteo)arthritis: Secondary | ICD-10-CM | POA: Diagnosis not present

## 2020-02-14 DIAGNOSIS — E1151 Type 2 diabetes mellitus with diabetic peripheral angiopathy without gangrene: Secondary | ICD-10-CM | POA: Diagnosis not present

## 2020-02-25 DIAGNOSIS — M150 Primary generalized (osteo)arthritis: Secondary | ICD-10-CM | POA: Diagnosis not present

## 2020-02-25 DIAGNOSIS — M519 Unspecified thoracic, thoracolumbar and lumbosacral intervertebral disc disorder: Secondary | ICD-10-CM | POA: Diagnosis not present

## 2020-02-25 DIAGNOSIS — I2510 Atherosclerotic heart disease of native coronary artery without angina pectoris: Secondary | ICD-10-CM | POA: Diagnosis not present

## 2020-02-25 DIAGNOSIS — I10 Essential (primary) hypertension: Secondary | ICD-10-CM | POA: Diagnosis not present

## 2020-02-25 DIAGNOSIS — E1151 Type 2 diabetes mellitus with diabetic peripheral angiopathy without gangrene: Secondary | ICD-10-CM | POA: Diagnosis not present

## 2020-03-07 DIAGNOSIS — G894 Chronic pain syndrome: Secondary | ICD-10-CM | POA: Diagnosis not present

## 2020-03-07 DIAGNOSIS — D696 Thrombocytopenia, unspecified: Secondary | ICD-10-CM | POA: Diagnosis not present

## 2020-03-07 DIAGNOSIS — I6523 Occlusion and stenosis of bilateral carotid arteries: Secondary | ICD-10-CM | POA: Diagnosis not present

## 2020-03-07 DIAGNOSIS — Z7984 Long term (current) use of oral hypoglycemic drugs: Secondary | ICD-10-CM | POA: Diagnosis not present

## 2020-03-07 DIAGNOSIS — E785 Hyperlipidemia, unspecified: Secondary | ICD-10-CM | POA: Diagnosis not present

## 2020-03-07 DIAGNOSIS — Z86010 Personal history of colonic polyps: Secondary | ICD-10-CM | POA: Diagnosis not present

## 2020-03-07 DIAGNOSIS — K219 Gastro-esophageal reflux disease without esophagitis: Secondary | ICD-10-CM | POA: Diagnosis not present

## 2020-03-07 DIAGNOSIS — M150 Primary generalized (osteo)arthritis: Secondary | ICD-10-CM | POA: Diagnosis not present

## 2020-03-07 DIAGNOSIS — I10 Essential (primary) hypertension: Secondary | ICD-10-CM | POA: Diagnosis not present

## 2020-03-07 DIAGNOSIS — E119 Type 2 diabetes mellitus without complications: Secondary | ICD-10-CM | POA: Diagnosis not present

## 2020-03-07 DIAGNOSIS — I2510 Atherosclerotic heart disease of native coronary artery without angina pectoris: Secondary | ICD-10-CM | POA: Diagnosis not present

## 2020-04-05 DIAGNOSIS — E1141 Type 2 diabetes mellitus with diabetic mononeuropathy: Secondary | ICD-10-CM | POA: Diagnosis not present

## 2020-04-05 DIAGNOSIS — M4316 Spondylolisthesis, lumbar region: Secondary | ICD-10-CM | POA: Diagnosis not present

## 2020-04-05 DIAGNOSIS — M5416 Radiculopathy, lumbar region: Secondary | ICD-10-CM | POA: Diagnosis not present

## 2020-04-06 DIAGNOSIS — E1151 Type 2 diabetes mellitus with diabetic peripheral angiopathy without gangrene: Secondary | ICD-10-CM | POA: Diagnosis not present

## 2020-04-06 DIAGNOSIS — E119 Type 2 diabetes mellitus without complications: Secondary | ICD-10-CM | POA: Diagnosis not present

## 2020-04-06 DIAGNOSIS — E785 Hyperlipidemia, unspecified: Secondary | ICD-10-CM | POA: Diagnosis not present

## 2020-04-06 DIAGNOSIS — M150 Primary generalized (osteo)arthritis: Secondary | ICD-10-CM | POA: Diagnosis not present

## 2020-04-06 DIAGNOSIS — K219 Gastro-esophageal reflux disease without esophagitis: Secondary | ICD-10-CM | POA: Diagnosis not present

## 2020-04-06 DIAGNOSIS — I10 Essential (primary) hypertension: Secondary | ICD-10-CM | POA: Diagnosis not present

## 2020-04-06 DIAGNOSIS — D509 Iron deficiency anemia, unspecified: Secondary | ICD-10-CM | POA: Diagnosis not present

## 2020-04-06 DIAGNOSIS — I2510 Atherosclerotic heart disease of native coronary artery without angina pectoris: Secondary | ICD-10-CM | POA: Diagnosis not present

## 2020-04-06 DIAGNOSIS — I5032 Chronic diastolic (congestive) heart failure: Secondary | ICD-10-CM | POA: Diagnosis not present

## 2020-04-06 DIAGNOSIS — G8929 Other chronic pain: Secondary | ICD-10-CM | POA: Diagnosis not present

## 2020-04-16 ENCOUNTER — Emergency Department (HOSPITAL_COMMUNITY): Payer: Medicare Other

## 2020-04-16 ENCOUNTER — Inpatient Hospital Stay (HOSPITAL_COMMUNITY)
Admission: EM | Admit: 2020-04-16 | Discharge: 2020-04-20 | DRG: 064 | Disposition: A | Discharge status: Rehabilitation Facility | Payer: Medicare Other | Source: Skilled Nursing Facility | Attending: Internal Medicine | Admitting: Internal Medicine

## 2020-04-16 ENCOUNTER — Other Ambulatory Visit: Payer: Self-pay

## 2020-04-16 ENCOUNTER — Inpatient Hospital Stay (HOSPITAL_COMMUNITY): Payer: Medicare Other

## 2020-04-16 ENCOUNTER — Encounter (HOSPITAL_COMMUNITY): Payer: Self-pay | Admitting: Radiology

## 2020-04-16 DIAGNOSIS — S0990XA Unspecified injury of head, initial encounter: Secondary | ICD-10-CM | POA: Diagnosis not present

## 2020-04-16 DIAGNOSIS — E782 Mixed hyperlipidemia: Secondary | ICD-10-CM | POA: Diagnosis not present

## 2020-04-16 DIAGNOSIS — E1165 Type 2 diabetes mellitus with hyperglycemia: Secondary | ICD-10-CM | POA: Diagnosis not present

## 2020-04-16 DIAGNOSIS — I6621 Occlusion and stenosis of right posterior cerebral artery: Secondary | ICD-10-CM | POA: Diagnosis not present

## 2020-04-16 DIAGNOSIS — Z7984 Long term (current) use of oral hypoglycemic drugs: Secondary | ICD-10-CM | POA: Diagnosis not present

## 2020-04-16 DIAGNOSIS — J9601 Acute respiratory failure with hypoxia: Secondary | ICD-10-CM | POA: Diagnosis present

## 2020-04-16 DIAGNOSIS — N179 Acute kidney failure, unspecified: Secondary | ICD-10-CM | POA: Diagnosis present

## 2020-04-16 DIAGNOSIS — Z20822 Contact with and (suspected) exposure to covid-19: Secondary | ICD-10-CM | POA: Diagnosis present

## 2020-04-16 DIAGNOSIS — Z741 Need for assistance with personal care: Secondary | ICD-10-CM | POA: Diagnosis not present

## 2020-04-16 DIAGNOSIS — I6503 Occlusion and stenosis of bilateral vertebral arteries: Secondary | ICD-10-CM | POA: Diagnosis not present

## 2020-04-16 DIAGNOSIS — E6609 Other obesity due to excess calories: Secondary | ICD-10-CM | POA: Diagnosis not present

## 2020-04-16 DIAGNOSIS — R4781 Slurred speech: Secondary | ICD-10-CM | POA: Diagnosis present

## 2020-04-16 DIAGNOSIS — E785 Hyperlipidemia, unspecified: Secondary | ICD-10-CM | POA: Diagnosis not present

## 2020-04-16 DIAGNOSIS — I10 Essential (primary) hypertension: Secondary | ICD-10-CM | POA: Diagnosis not present

## 2020-04-16 DIAGNOSIS — R29700 NIHSS score 0: Secondary | ICD-10-CM | POA: Diagnosis present

## 2020-04-16 DIAGNOSIS — E1159 Type 2 diabetes mellitus with other circulatory complications: Secondary | ICD-10-CM | POA: Diagnosis not present

## 2020-04-16 DIAGNOSIS — D509 Iron deficiency anemia, unspecified: Secondary | ICD-10-CM | POA: Diagnosis not present

## 2020-04-16 DIAGNOSIS — M6281 Muscle weakness (generalized): Secondary | ICD-10-CM | POA: Diagnosis not present

## 2020-04-16 DIAGNOSIS — E7220 Disorder of urea cycle metabolism, unspecified: Secondary | ICD-10-CM | POA: Diagnosis present

## 2020-04-16 DIAGNOSIS — E875 Hyperkalemia: Secondary | ICD-10-CM | POA: Diagnosis not present

## 2020-04-16 DIAGNOSIS — R296 Repeated falls: Secondary | ICD-10-CM | POA: Diagnosis not present

## 2020-04-16 DIAGNOSIS — R404 Transient alteration of awareness: Secondary | ICD-10-CM | POA: Diagnosis not present

## 2020-04-16 DIAGNOSIS — G2581 Restless legs syndrome: Secondary | ICD-10-CM | POA: Diagnosis not present

## 2020-04-16 DIAGNOSIS — K219 Gastro-esophageal reflux disease without esophagitis: Secondary | ICD-10-CM | POA: Diagnosis present

## 2020-04-16 DIAGNOSIS — Z79899 Other long term (current) drug therapy: Secondary | ICD-10-CM

## 2020-04-16 DIAGNOSIS — Z6829 Body mass index (BMI) 29.0-29.9, adult: Secondary | ICD-10-CM

## 2020-04-16 DIAGNOSIS — G8929 Other chronic pain: Secondary | ICD-10-CM | POA: Diagnosis present

## 2020-04-16 DIAGNOSIS — R58 Hemorrhage, not elsewhere classified: Secondary | ICD-10-CM | POA: Diagnosis not present

## 2020-04-16 DIAGNOSIS — I2510 Atherosclerotic heart disease of native coronary artery without angina pectoris: Secondary | ICD-10-CM | POA: Diagnosis present

## 2020-04-16 DIAGNOSIS — Z951 Presence of aortocoronary bypass graft: Secondary | ICD-10-CM | POA: Diagnosis not present

## 2020-04-16 DIAGNOSIS — I6300 Cerebral infarction due to thrombosis of unspecified precerebral artery: Secondary | ICD-10-CM | POA: Diagnosis not present

## 2020-04-16 DIAGNOSIS — E669 Obesity, unspecified: Secondary | ICD-10-CM | POA: Diagnosis present

## 2020-04-16 DIAGNOSIS — R262 Difficulty in walking, not elsewhere classified: Secondary | ICD-10-CM | POA: Diagnosis not present

## 2020-04-16 DIAGNOSIS — W19XXXA Unspecified fall, initial encounter: Secondary | ICD-10-CM

## 2020-04-16 DIAGNOSIS — I5032 Chronic diastolic (congestive) heart failure: Secondary | ICD-10-CM | POA: Diagnosis not present

## 2020-04-16 DIAGNOSIS — I499 Cardiac arrhythmia, unspecified: Secondary | ICD-10-CM | POA: Diagnosis not present

## 2020-04-16 DIAGNOSIS — I639 Cerebral infarction, unspecified: Secondary | ICD-10-CM | POA: Diagnosis present

## 2020-04-16 DIAGNOSIS — I6349 Cerebral infarction due to embolism of other cerebral artery: Secondary | ICD-10-CM | POA: Diagnosis not present

## 2020-04-16 DIAGNOSIS — R220 Localized swelling, mass and lump, head: Secondary | ICD-10-CM | POA: Diagnosis not present

## 2020-04-16 DIAGNOSIS — R6889 Other general symptoms and signs: Secondary | ICD-10-CM | POA: Diagnosis not present

## 2020-04-16 DIAGNOSIS — M545 Low back pain, unspecified: Secondary | ICD-10-CM | POA: Diagnosis not present

## 2020-04-16 DIAGNOSIS — G9341 Metabolic encephalopathy: Secondary | ICD-10-CM | POA: Diagnosis not present

## 2020-04-16 DIAGNOSIS — E1122 Type 2 diabetes mellitus with diabetic chronic kidney disease: Secondary | ICD-10-CM | POA: Diagnosis present

## 2020-04-16 DIAGNOSIS — I77810 Thoracic aortic ectasia: Secondary | ICD-10-CM | POA: Diagnosis present

## 2020-04-16 DIAGNOSIS — I6389 Other cerebral infarction: Secondary | ICD-10-CM

## 2020-04-16 DIAGNOSIS — R4182 Altered mental status, unspecified: Secondary | ICD-10-CM | POA: Diagnosis not present

## 2020-04-16 DIAGNOSIS — G9340 Encephalopathy, unspecified: Secondary | ICD-10-CM | POA: Diagnosis not present

## 2020-04-16 DIAGNOSIS — M7732 Calcaneal spur, left foot: Secondary | ICD-10-CM | POA: Diagnosis not present

## 2020-04-16 DIAGNOSIS — N182 Chronic kidney disease, stage 2 (mild): Secondary | ICD-10-CM | POA: Diagnosis not present

## 2020-04-16 DIAGNOSIS — M2012 Hallux valgus (acquired), left foot: Secondary | ICD-10-CM | POA: Diagnosis not present

## 2020-04-16 DIAGNOSIS — I130 Hypertensive heart and chronic kidney disease with heart failure and stage 1 through stage 4 chronic kidney disease, or unspecified chronic kidney disease: Secondary | ICD-10-CM | POA: Diagnosis not present

## 2020-04-16 DIAGNOSIS — F32A Depression, unspecified: Secondary | ICD-10-CM | POA: Diagnosis not present

## 2020-04-16 DIAGNOSIS — E119 Type 2 diabetes mellitus without complications: Secondary | ICD-10-CM

## 2020-04-16 DIAGNOSIS — I2729 Other secondary pulmonary hypertension: Secondary | ICD-10-CM | POA: Diagnosis present

## 2020-04-16 DIAGNOSIS — M549 Dorsalgia, unspecified: Secondary | ICD-10-CM | POA: Diagnosis present

## 2020-04-16 DIAGNOSIS — M810 Age-related osteoporosis without current pathological fracture: Secondary | ICD-10-CM | POA: Diagnosis not present

## 2020-04-16 DIAGNOSIS — I272 Pulmonary hypertension, unspecified: Secondary | ICD-10-CM | POA: Diagnosis present

## 2020-04-16 DIAGNOSIS — E7800 Pure hypercholesterolemia, unspecified: Secondary | ICD-10-CM | POA: Diagnosis not present

## 2020-04-16 DIAGNOSIS — Z043 Encounter for examination and observation following other accident: Secondary | ICD-10-CM | POA: Diagnosis not present

## 2020-04-16 DIAGNOSIS — Z7902 Long term (current) use of antithrombotics/antiplatelets: Secondary | ICD-10-CM

## 2020-04-16 DIAGNOSIS — I517 Cardiomegaly: Secondary | ICD-10-CM | POA: Diagnosis not present

## 2020-04-16 DIAGNOSIS — R279 Unspecified lack of coordination: Secondary | ICD-10-CM | POA: Diagnosis not present

## 2020-04-16 DIAGNOSIS — Z7982 Long term (current) use of aspirin: Secondary | ICD-10-CM | POA: Diagnosis not present

## 2020-04-16 DIAGNOSIS — E1151 Type 2 diabetes mellitus with diabetic peripheral angiopathy without gangrene: Secondary | ICD-10-CM | POA: Diagnosis present

## 2020-04-16 DIAGNOSIS — Z743 Need for continuous supervision: Secondary | ICD-10-CM | POA: Diagnosis not present

## 2020-04-16 DIAGNOSIS — I6523 Occlusion and stenosis of bilateral carotid arteries: Secondary | ICD-10-CM | POA: Diagnosis not present

## 2020-04-16 LAB — COMPREHENSIVE METABOLIC PANEL
ALT: 72 U/L — ABNORMAL HIGH (ref 0–44)
AST: 73 U/L — ABNORMAL HIGH (ref 15–41)
Albumin: 3.7 g/dL (ref 3.5–5.0)
Alkaline Phosphatase: 49 U/L (ref 38–126)
Anion gap: 9 (ref 5–15)
BUN: 38 mg/dL — ABNORMAL HIGH (ref 8–23)
CO2: 28 mmol/L (ref 22–32)
Calcium: 9.8 mg/dL (ref 8.9–10.3)
Chloride: 101 mmol/L (ref 98–111)
Creatinine, Ser: 1.42 mg/dL — ABNORMAL HIGH (ref 0.44–1.00)
GFR, Estimated: 38 mL/min — ABNORMAL LOW (ref >60)
Glucose, Bld: 230 mg/dL — ABNORMAL HIGH (ref 70–99)
Potassium: 5.3 mmol/L — ABNORMAL HIGH (ref 3.5–5.1)
Sodium: 138 mmol/L (ref 135–145)
Total Bilirubin: 1.2 mg/dL (ref 0.3–1.2)
Total Protein: 6.7 g/dL (ref 6.5–8.1)

## 2020-04-16 LAB — URINALYSIS, ROUTINE W REFLEX MICROSCOPIC
Bilirubin Urine: NEGATIVE
Glucose, UA: NEGATIVE mg/dL
Hgb urine dipstick: NEGATIVE
Ketones, ur: NEGATIVE mg/dL
Leukocytes,Ua: NEGATIVE
Nitrite: NEGATIVE
Protein, ur: NEGATIVE mg/dL
Specific Gravity, Urine: 1.043 — ABNORMAL HIGH (ref 1.005–1.030)
pH: 6 (ref 5.0–8.0)

## 2020-04-16 LAB — APTT: aPTT: 28 seconds (ref 24–36)

## 2020-04-16 LAB — ECHOCARDIOGRAM COMPLETE
Area-P 1/2: 5.75 cm2
Calc EF: 64.1 %
MV M vel: 5.31 m/s
MV Peak grad: 112.8 mmHg
MV VTI: 1.59 cm2
Radius: 0.55 cm
S' Lateral: 2.3 cm
Single Plane A2C EF: 64.3 %
Single Plane A4C EF: 63.4 %

## 2020-04-16 LAB — DIFFERENTIAL
Abs Immature Granulocytes: 0.03 10*3/uL (ref 0.00–0.07)
Basophils Absolute: 0 10*3/uL (ref 0.0–0.1)
Basophils Relative: 0 %
Eosinophils Absolute: 0 10*3/uL (ref 0.0–0.5)
Eosinophils Relative: 0 %
Immature Granulocytes: 0 %
Lymphocytes Relative: 20 %
Lymphs Abs: 1.3 10*3/uL (ref 0.7–4.0)
Monocytes Absolute: 0.8 10*3/uL (ref 0.1–1.0)
Monocytes Relative: 12 %
Neutro Abs: 4.5 10*3/uL (ref 1.7–7.7)
Neutrophils Relative %: 68 %

## 2020-04-16 LAB — AMMONIA: Ammonia: 48 umol/L — ABNORMAL HIGH (ref 9–35)

## 2020-04-16 LAB — I-STAT CHEM 8, ED
BUN: 40 mg/dL — ABNORMAL HIGH (ref 8–23)
Calcium, Ion: 1.26 mmol/L (ref 1.15–1.40)
Chloride: 101 mmol/L (ref 98–111)
Creatinine, Ser: 1.3 mg/dL — ABNORMAL HIGH (ref 0.44–1.00)
Glucose, Bld: 210 mg/dL — ABNORMAL HIGH (ref 70–99)
HCT: 37 % (ref 36.0–46.0)
Hemoglobin: 12.6 g/dL (ref 12.0–15.0)
Potassium: 5.2 mmol/L — ABNORMAL HIGH (ref 3.5–5.1)
Sodium: 139 mmol/L (ref 135–145)
TCO2: 29 mmol/L (ref 22–32)

## 2020-04-16 LAB — PROTIME-INR
INR: 1.2 (ref 0.8–1.2)
Prothrombin Time: 15.1 seconds (ref 11.4–15.2)

## 2020-04-16 LAB — CBC
HCT: 36.8 % (ref 36.0–46.0)
Hemoglobin: 11.8 g/dL — ABNORMAL LOW (ref 12.0–15.0)
MCH: 39.5 pg — ABNORMAL HIGH (ref 26.0–34.0)
MCHC: 32.1 g/dL (ref 30.0–36.0)
MCV: 123.1 fL — ABNORMAL HIGH (ref 80.0–100.0)
Platelets: 165 10*3/uL (ref 150–400)
RBC: 2.99 MIL/uL — ABNORMAL LOW (ref 3.87–5.11)
RDW: 14.3 % (ref 11.5–15.5)
WBC: 6.7 10*3/uL (ref 4.0–10.5)
nRBC: 2.2 % — ABNORMAL HIGH (ref 0.0–0.2)

## 2020-04-16 LAB — GLUCOSE, CAPILLARY
Glucose-Capillary: 248 mg/dL — ABNORMAL HIGH (ref 70–99)
Glucose-Capillary: 243 mg/dL — ABNORMAL HIGH (ref 70–99)

## 2020-04-16 LAB — RESP PANEL BY RT-PCR (FLU A&B, COVID) ARPGX2
Influenza A by PCR: NEGATIVE
Influenza B by PCR: NEGATIVE
SARS Coronavirus 2 by RT PCR: NEGATIVE

## 2020-04-16 LAB — HEMOGLOBIN A1C
Hgb A1c MFr Bld: 7.1 % — ABNORMAL HIGH (ref 4.8–5.6)
Mean Plasma Glucose: 157.07 mg/dL

## 2020-04-16 LAB — CBG MONITORING, ED: Glucose-Capillary: 205 mg/dL — ABNORMAL HIGH (ref 70–99)

## 2020-04-16 MED ORDER — SODIUM CHLORIDE 0.9% FLUSH
3.0000 mL | Freq: Once | INTRAVENOUS | Status: AC
  Administered 2020-04-16: 3 mL via INTRAVENOUS

## 2020-04-16 MED ORDER — IOHEXOL 350 MG/ML SOLN
100.0000 mL | Freq: Once | INTRAVENOUS | Status: AC | PRN
  Administered 2020-04-16: 100 mL via INTRAVENOUS

## 2020-04-16 MED ORDER — STROKE: EARLY STAGES OF RECOVERY BOOK
Freq: Once | Status: AC
  Filled 2020-04-16: qty 1

## 2020-04-16 MED ORDER — ACETAMINOPHEN 325 MG PO TABS
650.0000 mg | ORAL_TABLET | ORAL | Status: DC | PRN
  Administered 2020-04-16 (×2): 650 mg via ORAL
  Filled 2020-04-16 (×3): qty 2

## 2020-04-16 MED ORDER — SODIUM CHLORIDE 0.9 % IV BOLUS
1000.0000 mL | Freq: Once | INTRAVENOUS | Status: AC
  Administered 2020-04-16: 1000 mL via INTRAVENOUS

## 2020-04-16 MED ORDER — ACETAMINOPHEN 650 MG RE SUPP: 650.0000 mg | RECTAL | Status: DC | PRN

## 2020-04-16 MED ORDER — ASPIRIN 300 MG RE SUPP: 300.0000 mg | Freq: Every day | RECTAL | Status: DC

## 2020-04-16 MED ORDER — ASPIRIN 325 MG PO TABS
325.0000 mg | ORAL_TABLET | Freq: Every day | ORAL | Status: DC
  Administered 2020-04-16 – 2020-04-17 (×2): 325 mg via ORAL
  Filled 2020-04-16 (×2): qty 1

## 2020-04-16 MED ORDER — ACETAMINOPHEN 160 MG/5ML PO SOLN: 650.0000 mg | ORAL | Status: DC | PRN

## 2020-04-16 MED ORDER — HYDROCODONE-ACETAMINOPHEN 10-325 MG PO TABS
1.0000 | ORAL_TABLET | Freq: Two times a day (BID) | ORAL | Status: DC | PRN
  Administered 2020-04-16 – 2020-04-17 (×3): 1 via ORAL
  Filled 2020-04-16 (×3): qty 1

## 2020-04-16 MED ORDER — SODIUM POLYSTYRENE SULFONATE 15 GM/60ML PO SUSP
15.0000 g | Freq: Once | ORAL | Status: AC
  Administered 2020-04-16: 15 g via ORAL
  Filled 2020-04-16: qty 60

## 2020-04-16 MED ORDER — INSULIN ASPART 100 UNIT/ML ~~LOC~~ SOLN
0.0000 [IU] | Freq: Three times a day (TID) | SUBCUTANEOUS | Status: DC
  Administered 2020-04-16: 2 [IU] via SUBCUTANEOUS
  Administered 2020-04-17: 1 [IU] via SUBCUTANEOUS
  Administered 2020-04-17: 2 [IU] via SUBCUTANEOUS
  Administered 2020-04-17 – 2020-04-18 (×2): 1 [IU] via SUBCUTANEOUS
  Administered 2020-04-18: 4 [IU] via SUBCUTANEOUS
  Administered 2020-04-18: 3 [IU] via SUBCUTANEOUS
  Administered 2020-04-19 (×2): 2 [IU] via SUBCUTANEOUS

## 2020-04-16 NOTE — Consult Note (Signed)
Neurology Consultation  Reason for Consult: Code stroke for altered mental status and fall followed by slurred speech Referring Physician: Dr. Deno Etienne  CC: Fall followed by the mental status and slurred speech  History is obtained from: EMS, patient  HPI: Andrea Kirk is a 80 y.o. female extensive past medical history of cerebrovascular risk factors including coronary artery disease status post CABG in 1997, carotid stenosis status post right CEA, diabetes, hypertension, hyperlipidemia with last known well sometime yesterday, sometime before 3 AM-at 3 AM she woke up to go to the bathroom and had a fall.  EMS was called and recommended that she be taken to the hospital for evaluation after the fall but she refused.  She went to bed and woke up again this morning and fell.  She was in the bathroom with her head presumably having had struck the floor.  Unclear if there was any LOC. EMS was called again, they noted her to be having slurred speech and acting confused for orientation questions and brought her as a code stroke to the emergency room. She was evaluated in the bridge and taken for a stat head CT. Head and neck vessel imaging was also performed.  She denies any chest pain shortness of breath fevers or chills.  She denies any tingling numbness or weakness.  Chart review reveals prior admissions for falls.  Unclear if that has been a frequent occurrence.   LKW: Before 3 AM on 04/16/2020 tpa given?: no, outside the window Premorbid modified Rankin scale (mRS): Unreliable historian-EMS reported a Rollator walker at her room in the assisted living which would make an MRI as of 2-3.  No family at bedside to provide more details at this time   ROS: Full review of systems was performed but unable to ascertain reliability due to some confusion.   Past Medical History:  Diagnosis Date  . Acute kidney injury ([place]) 08/05/2016  . Aftercare following surgery of the circulatory system, [place]  05/11/2013  . AKI (acute kidney injury) ([place]) 08/04/2016  . Anxiety    takes Celexa daily  . ARF (acute renal failure) ([place]) 11/07/2016  . Asymptomatic stenosis of right carotid artery 03/22/2016  . Back pain    occasionally  . Carotid artery disease ([place]) 04/16/2013  . Carotid artery occlusion   . Carotid stenosis 11/16/2013  . Cataract    left and immature  . Coronary artery disease   . Coronary atherosclerosis of native coronary artery 03/11/2013   S/p CABG in 1997   . Depression   . Diabetes mellitus    takes Metformin and Glipizide daily  . Diabetes mellitus ([place]) 05/02/2015  . Dizziness    takes Meclizine daily as needed  . Essential hypertension, benign 03/11/2013  . GERD (gastroesophageal reflux disease)    takes Omeprazole daily as needed  . Headache(784.0)   . Hyperlipidemia    takes Atorvastatin daily  . Hypertension    takes Carvedilol daily  . Mixed hyperlipidemia 03/11/2013  . Muscle spasm    takes Robaxin daily as needed  . Nausea    takes Phenergan daily as needed  . Occlusion and stenosis of carotid artery without mention of cerebral infarction 07/19/2011  . Pneumonia    hx of-in high school  . Restless leg    takes Requip daily as needed  . Seasonal allergies    takes Claritin daily as needed and Afrin as needed  . Shortness of breath    with exertion  . Urinary  urgency   . UTI (urinary tract infection) 11/07/2016        Family History  Problem Relation Age of Onset  . Heart attack Father   . Heart disease Father 37       Before age of 45  . Hyperlipidemia Father   . Heart disease Brother        Heart dissease before age 7  . Hyperlipidemia Brother   . Cirrhosis Mother   . Heart attack Paternal Grandfather   . Heart disease Paternal Grandfather   . Cancer Maternal Grandmother   . Alcoholism Maternal Grandfather   . Heart attack Paternal Grandmother      Social History:   reports that she has never smoked. She has never used smokeless tobacco.  She reports that she does not drink alcohol and does not use drugs.  Medications  Current Facility-Administered Medications:  .  sodium chloride flush (NS) 0.9 % injection 3 mL, 3 mL, Intravenous, Once, Deno Etienne, DO  Current Outpatient Medications:  .  acetaminophen (TYLENOL) 325 MG tablet, Take 650 mg by mouth every 6 (six) hours as needed for moderate pain., Disp: , Rfl:  .  amLODipine (NORVASC) 5 MG tablet, Take 1 tablet (5 mg total) by mouth daily., Disp: 30 tablet, Rfl: 0 .  Ascorbic Acid (VITAMIN C) 1000 MG tablet, Take 1,000 mg by mouth daily., Disp: , Rfl:  .  aspirin EC 81 MG tablet, Take 81 mg by mouth every evening. , Disp: , Rfl:  .  Calcium Carbonate-Vitamin D (CALTRATE 600+D PO), Take 1 tablet by mouth daily., Disp: , Rfl:  .  carvedilol (COREG) 25 MG tablet, TAKE ONE TABLET BY MOUTH TWICE A DAY, Disp: 180 tablet, Rfl: 3 .  cholecalciferol (VITAMIN D3) 25 MCG (1000 UT) tablet, Take 1,000 Units by mouth daily., Disp: , Rfl:  .  citalopram (CELEXA) 20 MG tablet, Take 20 mg by mouth every morning. , Disp: , Rfl:  .  clopidogrel (PLAVIX) 75 MG tablet, Take 75 mg by mouth every morning. , Disp: , Rfl:  .  Cyanocobalamin (B-12) 2500 MCG TABS, Take 2,500 mcg by mouth daily., Disp: , Rfl:  .  FERROUS SULFATE PO, Take 325 mg by mouth daily., Disp: , Rfl:  .  fish oil-omega-3 fatty acids 1000 MG capsule, Take 1 g by mouth daily., Disp: , Rfl:  .  furosemide (LASIX) 20 MG tablet, Take 20 mg by mouth daily as needed for fluid or edema. , Disp: , Rfl:  .  gabapentin (NEURONTIN) 300 MG capsule, Take 300 mg by mouth 4 (four) times daily. , Disp: , Rfl:  .  glipiZIDE (GLUCOTROL XL) 10 MG 24 hr tablet, Take 10 mg by mouth daily with breakfast. , Disp: , Rfl:  .  HYDROcodone-acetaminophen (NORCO) 10-325 MG tablet, Take 1 tablet by mouth 2 (two) times daily as needed for severe pain. , Disp: , Rfl:  .  lisinopril (ZESTRIL) 5 MG tablet, Take 5 mg by mouth daily., Disp: , Rfl:  .  Magnesium Oxide  250 MG TABS, Take 1 tablet by mouth daily., Disp: , Rfl:  .  meclizine (ANTIVERT) 25 MG tablet, Take 25 mg by mouth 2 (two) times daily as needed for dizziness. For dizziness, Disp: , Rfl:  .  methocarbamol (ROBAXIN) 500 MG tablet, Take 1 tablet (500 mg total) by mouth every 8 (eight) hours as needed for muscle spasms., Disp: 30 tablet, Rfl: 0 .  nitroGLYCERIN (NITROSTAT) 0.4 MG SL tablet, Place 1  tablet (0.4 mg total) under the tongue every 5 (five) minutes as needed. Chest pain., Disp: 25 tablet, Rfl: 5 .  pantoprazole (PROTONIX) 40 MG tablet, Take 40 mg by mouth daily as needed (INDIGESTION). , Disp: , Rfl:  .  promethazine (PHENERGAN) 12.5 MG tablet, Take 12.5 mg by mouth every 6 (six) hours as needed for nausea or vomiting., Disp: , Rfl:  .  rosuvastatin (CRESTOR) 5 MG tablet, Take 5 mg by mouth daily. Takes on Monday and Friday, Disp: , Rfl:    Exam: Current vital signs: BP (!) 156/75   Pulse 86   Temp 98.9 F (37.2 C) (Oral)   Resp 15   SpO2 92%  Vital signs in last 24 hours: Temp:  [98.9 F (37.2 C)] 98.9 F (37.2 C) (04/10 0838) Pulse Rate:  [86] 86 (04/10 0838) Resp:  [15] 15 (04/10 0838) BP: (156)/(75) 156/75 (04/10 0838) SpO2:  [92 %] 92 % (04/10 0838)  General: Awake alert in no distress HEENT: Normocephalic atraumatic CVs: Regular rate rhythm Abdomen nondistended nontender Extremities warm well perfused Neurological exam She is awake alert oriented to self. She is not aware of the fact that she will be brought to the hospital She was able to tell me the correc month but not the correct year upon multiple times questioning. Unable to tell the correct age No gross aphasia noted but has some repetitive questioning and perseverations. Cranial nerves: Pupils are equal round react light, extraocular movements appear unrestricted, no field cuts, face appears symmetric. Motor exam: She is able to lift all 4 extremities antigravity without drift. Sensory exam: Intact to  touch all over Coordination difficult assess given her NIHSS   Labs I have reviewed labs in epic and the results pertinent to this consultation are:  CBC    Component Value Date/Time   WBC 4.1 10/05/2019 1427   RBC 3.82 (L) 10/05/2019 1427   RBC 3.89 10/05/2019 1427   HGB 12.6 04/16/2020 0806   HCT 37.0 04/16/2020 0806   HCT 35.3 11/08/2016 0516   PLT 172 10/05/2019 1427   MCV 110.7 (H) 10/05/2019 1427   MCH 37.2 (H) 10/05/2019 1427   MCHC 33.6 10/05/2019 1427   RDW 12.6 10/05/2019 1427   LYMPHSABS 1.0 10/05/2019 1427   MONOABS 0.3 10/05/2019 1427   EOSABS 0.1 10/05/2019 1427   BASOSABS 0.0 10/05/2019 1427    CMP     Component Value Date/Time   NA 139 04/16/2020 0806   K 5.2 (H) 04/16/2020 0806   CL 101 04/16/2020 0806   CO2 30 10/05/2019 1427   GLUCOSE 210 (H) 04/16/2020 0806   BUN 40 (H) 04/16/2020 0806   CREATININE 1.30 (H) 04/16/2020 0806   CREATININE 0.80 10/05/2019 1427   CALCIUM 9.9 10/05/2019 1427   PROT 7.6 10/05/2019 1427   ALBUMIN 3.9 10/05/2019 1427   AST 25 10/05/2019 1427   ALT 19 10/05/2019 1427   ALKPHOS 80 10/05/2019 1427   BILITOT 0.7 10/05/2019 1427   GFRNONAA >60 10/05/2019 1427   GFRAA >60 10/05/2019 1427   Creatinine is elevated from the baseline-wait for the formal labs to come.  Imaging I have reviewed the images obtained:  CT-scan of the brain-no acute changes.  Aspects 10. CT angio head and neck with no emergent LVO.  There is intracranial atherosclerosis.  Assessment: 81 year old with multiple cerebrovascular risk factors presenting with slurred speech and confusion after a fall. Exam is nonfocal in terms of motor or sensory findings.  His suggestive of  encephalopathy. CT head unremarkable CT angio head and neck with no emergent LVO Not a candidate for TPA due to  being outside the window Not a candidate for endovascular thrombectomy due to no emergent LVO  Likely toxic metabolic encephalopathy v embolic strokes.  Given the  risk factors, will be prudent to rule out a stroke. Other differentials include toxic metabolic encephalopathy v postconcussive symptoms as she did hit her head on the floor.  Impression: Evaluate for stroke Evaluate for causes of encephalopathy such as UTI or pneumonia. Question postconcussive confusion  Recommendations: Check UA, chest x-ray, ammonia levels Management of delusional function per primary team MRI of the brain to evaluate for stroke. Ordered stat CT C-spine to evaluate for any fractures as she did fall and hit her head on the ground. Will follow imaging and update recs Plan d/w Dr. 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  Suspect cardioembolic etiology Admit to hospitalist Frequent neurochecks Telemetry 2D echo A1c Lipid panel PT OT speech therapy Continue dual antiplateles Atorvastatin 80 Stroke team will follow with you Discussed with Dr Tyrone Nine -- Amie Portland, MD Neurologist [facility] Pager: [phone]  CRITICAL CARE ATTESTATION Performed by: Amie Portland, MD Total critical care time: 60 minutes Critical care time was exclusive of separately billable procedures and treating other patients and/or supervising APPs/Residents/Students Critical care was necessary to treat or prevent imminent or life-threatening deterioration due to acute ischemic stroke This patient is critically ill and at significant risk for neurological worsening and/or death and care requires constant monitoring. Critical care was time spent personally by me on the following activities: development of treatment plan with patient and/or surrogate as well as nursing, discussions with consultants, evaluation of patient's response to treatment,  examination of patient, obtaining history from patient or surrogate, ordering and performing treatments and interventions, ordering and review of laboratory studies, ordering and review of radiographic studies, pulse oximetry, re-evaluation of patient's condition, participation in multidisciplinary rounds and medical decision making of high complexity in the care of this patient.

## 2020-04-16 NOTE — ED Triage Notes (Signed)
Pt came in via [facility] from an assisted living. Pt fell this morning around 3am.pt stated she did hit her head.  Pt exhibited s/s of slurred speech, confusion and generalized weakness.

## 2020-04-16 NOTE — Progress Notes (Signed)
  Echocardiogram 2D Echocardiogram has been performed.  Bobbye Charleston 04/16/2020, 3:49 PM

## 2020-04-16 NOTE — ED Notes (Signed)
Pt passed stroke swallow screen  

## 2020-04-16 NOTE — ED Notes (Signed)
Patient transported to MRI 

## 2020-04-16 NOTE — Evaluation (Signed)
Physical Therapy Evaluation Patient Details Name: Andrea Kirk MRN: [id number] DOB: 1940/12/05 Today's Date: 04/16/2020   History of Present Illness  Pt is a 80 y.o. F who presents after a fall with AMS. 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 Significant PMH: CAD, DM, HTN.  Clinical Impression  Prior to admission, pt lives in an ILF, uses a Rollator for mobility and is independent with ADL's. Pt appears fairly close to her functional baseline. Reporting generalized pain. Ambulating x 70 feet with a walker at a min guard assist level. Did note decreased right foot clearance and step length but no gross imbalance. HR 80's, SpO2 96% on 3L O2 (wears oxygen at baseline). Pt agreeable to follow up with HHPT in order to maximize functional independence.     Follow Up Recommendations Home health PT;Supervision - Intermittent    Equipment Recommendations  None recommended by PT    Recommendations for Other Services       Precautions / Restrictions Precautions Precautions: Fall Restrictions Weight Bearing Restrictions: No      Mobility  Bed Mobility Overal bed mobility: Needs Assistance Bed Mobility: Supine to Sit     Supine to sit: Min assist     General bed mobility comments: MinA to pull up to sitting position    Transfers Overall transfer level: Needs assistance Equipment used: Rolling walker (2 wheeled) Transfers: Sit to/from Stand Sit to Stand: Min assist         General transfer comment: Light minA to boost up from lower surface  Ambulation/Gait Ambulation/Gait assistance: Min guard Gait Distance (Feet): 70 Feet Assistive device: Rolling walker (2 wheeled) Gait Pattern/deviations: Step-through pattern;Decreased dorsiflexion - right;Decreased step length - right;Trunk flexed Gait velocity: decreased   General Gait Details: Min guard for safety, no overt  LOB  Stairs            Wheelchair Mobility    Modified Rankin (Stroke Patients Only) Modified Rankin (Stroke Patients Only) Pre-Morbid Rankin Score: Moderate disability Modified Rankin: Moderately severe disability     Balance Overall balance assessment: Needs assistance Sitting-balance support: Feet supported Sitting balance-Leahy Scale: Good     Standing balance support: Bilateral upper extremity supported Standing balance-Leahy Scale: Poor Standing balance comment: reliant on external support                             Pertinent Vitals/Pain Pain Assessment: Faces Faces Pain Scale: Hurts even more Pain Location: generalized Pain Intervention(s): Monitored during session;Patient requesting pain meds-RN notified    Home Living Family/patient expects to be discharged to:: Private residence Living Arrangements: Alone   Type of Home: Independent living facility Home Access: Level entry     Home Layout: One [place]: [place] - 4 wheels;Cane - single point      Prior Function Level of Independence: Independent;Independent with assistive device(s)         Comments: Uses Rollator, gets meals delivered     Hand Dominance        Extremity/Trunk Assessment   Upper Extremity Assessment Upper Extremity Assessment: Defer to OT evaluation    Lower Extremity Assessment Lower Extremity Assessment: RLE deficits/detail;LLE deficits/detail RLE Deficits / Details: Grossly 4/5 LLE Deficits / Details: Grossly 4/5    Cervical / Trunk Assessment Cervical / Trunk Assessment: Kyphotic  Communication   Communication: No difficulties  Cognition Arousal/Alertness: Awake/alert Behavior During  Therapy: WFL for tasks assessed/performed Overall Cognitive Status: Within Functional Limits for tasks assessed                                        General Comments      Exercises     Assessment/Plan    PT Assessment Patient  needs continued PT services  PT Problem List Decreased strength;Decreased activity tolerance;Decreased balance;Decreased mobility       PT Treatment Interventions DME instruction;Gait training;Functional mobility training;Therapeutic activities;Therapeutic exercise;Balance training;Patient/family education    PT Goals (Current goals can be found in the Care Plan section)  Acute Rehab PT Goals Patient Stated Goal: agreeable to [facility] PT Goal Formulation: With patient Time For Goal Achievement: 04/30/20 Potential to Achieve Goals: Good    Frequency Min 4X/week   Barriers to discharge        Co-evaluation               AM-PAC PT "6 Clicks" Mobility  Outcome Measure Help needed turning from your back to your side while in a flat bed without using bedrails?: None Help needed moving from lying on your back to sitting on the side of a flat bed without using bedrails?: A Little Help needed moving to and from a bed to a chair (including a wheelchair)?: A Little Help needed standing up from a chair using your arms (e.g., wheelchair or bedside chair)?: A Little Help needed to walk in hospital room?: A Little Help needed climbing 3-5 steps with a railing? : A Little 6 Click Score: 19    End of Session Equipment Utilized During Treatment: Gait belt Activity Tolerance: Patient tolerated treatment well Patient left: in bed;with call bell/phone within reach;with bed alarm set Nurse Communication: Mobility status PT Visit Diagnosis: Unsteadiness on feet (R26.81);Difficulty in walking, not elsewhere classified (R26.2)    Time: 9563-8756 PT Time Calculation (min) (ACUTE ONLY): 24 min   Charges:   PT Evaluation $PT Eval Moderate Complexity: 1 Mod PT Treatments $Therapeutic Activity: 8-22 mins        Andrea Kirk, PT, DPT Acute Rehabilitation Services Pager [phone] Office [phone]   Andrea Kirk 04/16/2020, 5:12 PM

## 2020-04-16 NOTE — Code Documentation (Signed)
Patient presented with confusion and slurred speech.  She had previously woken up at 3 am to use the restroom and had a fall.  Her LKW was 3 am, right before her mechanical fall.  Code stroke called at [phone].  Q 4 hours neuro checks ordered because patient is out of treatment window.  Code stroke cancelled at [phone]

## 2020-04-16 NOTE — ED Notes (Signed)
Sitting up in bed eating a sandwich, no distress

## 2020-04-16 NOTE — ED Provider Notes (Signed)
[facility] EMERGENCY DEPARTMENT Provider Note   CSN: [id number] Arrival date & time: 04/16/20  0758     History Chief Complaint  Patient presents with  . Dizziness    Andrea Kirk is a 80 y.o. female.  80 yo F with with a cc of dizziness and difficulty walking.  Multiple falls this morning.  EMS felt met code stroke criteria, sent here as same.  Level V caveat acuity of condition.   The history is provided by the patient.  Illness Severity:  Severe Onset quality:  Sudden Duration:  2 hours Timing:  Constant Progression:  Unchanged Chronicity:  New      Past Medical History:  Diagnosis Date  . Acute kidney injury ([place]) 08/05/2016  . Aftercare following surgery of the circulatory system, [place] 05/11/2013  . AKI (acute kidney injury) ([place]) 08/04/2016  . Anxiety    takes Celexa daily  . ARF (acute renal failure) ([place]) 11/07/2016  . Asymptomatic stenosis of right carotid artery 03/22/2016  . Back pain    occasionally  . Carotid artery disease ([place]) 04/16/2013  . Carotid artery occlusion   . Carotid stenosis 11/16/2013  . Cataract    left and immature  . Coronary artery disease   . Coronary atherosclerosis of native coronary artery 03/11/2013   S/p CABG in 1997   . Depression   . Diabetes mellitus    takes Metformin and Glipizide daily  . Diabetes mellitus ([place]) 05/02/2015  . Dizziness    takes Meclizine daily as needed  . Essential hypertension, benign 03/11/2013  . GERD (gastroesophageal reflux disease)    takes Omeprazole daily as needed  . Headache(784.0)   . Hyperlipidemia    takes Atorvastatin daily  . Hypertension    takes Carvedilol daily  . Mixed hyperlipidemia 03/11/2013  . Muscle spasm    takes Robaxin daily as needed  . Nausea    takes Phenergan daily as needed  . Occlusion and stenosis of carotid artery without mention of cerebral infarction 07/19/2011  . Pneumonia    hx of-in high school  . Restless leg    takes Requip daily as needed   . Seasonal allergies    takes Claritin daily as needed and Afrin as needed  . Shortness of breath    with exertion  . Urinary urgency   . UTI (urinary tract infection) 11/07/2016    Patient Active Problem List   Diagnosis Date Noted  . Follow-up examination after eye surgery 04/22/2019  . 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 Depression 12/22/2017  . Chronic pain 12/22/2017  . 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 Falls 12/22/2017  . Chronic diastolic CHF (congestive heart failure) ([place]) 12/22/2017  . Hypoxia 12/21/2017  . ARF (acute renal failure) ([place]) 11/07/2016  . UTI (urinary tract infection) 11/07/2016  . Acute kidney injury ([place]) 08/05/2016  . AKI (acute kidney injury) ([place]) 08/04/2016  . Frequent falls 07/16/2016  . Hypertension 07/16/2016  . Asymptomatic stenosis of right carotid artery 03/22/2016  . Diabetes mellitus ([place]) 05/02/2015  . Carotid stenosis 11/16/2013  . Aftercare following surgery of the circulatory system, [employer] 05/11/2013  . Carotid artery disease ([place]) 04/16/2013  . Coronary artery disease 03/11/2013  . Essential hypertension, benign 03/11/2013  . Hyperlipidemia 03/11/2013  . Occlusion and stenosis of carotid artery without mention of cerebral infarction 07/19/2011    Past Surgical History:  Procedure Laterality Date  . COLONOSCOPY WITH PROPOFOL N/A 03/10/2012  Procedure: COLONOSCOPY WITH PROPOFOL;  Surgeon: Garlan Fair, MD;  Location: [facility];  Service: Endoscopy;  Laterality: N/A;  . CORNEAL TRANSPLANT Right   . CORONARY ARTERY BYPASS GRAFT  1997   x 6  . CORONARY ARTERY BYPASS GRAFT  Jan. 1997  . ENDARTERECTOMY Left 04/16/2013   Procedure: Left Carotid Artery Endatarectomy with Resection of Redundant Internal Carotid Artery;  Surgeon: Rosetta Posner, MD;  Location: [facility];  Service: Vascular;  Laterality: Left;  . ENDARTERECTOMY Right 03/22/2016   Procedure: RIGHT  ENDARTERECTOMY CAROTID;  Surgeon: Rosetta Posner, MD;  Location: [facility] OR;  Service: Vascular;  Laterality: Right;  . ESOPHAGOGASTRODUODENOSCOPY N/A 03/10/2012   Procedure: ESOPHAGOGASTRODUODENOSCOPY (EGD);  Surgeon: Garlan Fair, MD;  Location: [facility];  Service: Endoscopy;  Laterality: N/A;  . EYE SURGERY  March 12, 2001   CORNEA TRANSPLANT Right eye  . PATCH ANGIOPLASTY Right 03/22/2016   Procedure: PATCH ANGIOPLASTY;  Surgeon: Rosetta Posner, MD;  Location: [facility];  Service: Vascular;  Laterality: Right;  . PR VEIN BYPASS GRAFT,AORTO-FEM-POP  1997  . SPINE SURGERY  march 2013   Back surgery  . TONSILLECTOMY    . TRIGGER FINGER RELEASE Left    thumb     OB History   No obstetric history on file.     Family History  Problem Relation Age of Onset  . Heart attack Father   . Heart disease Father 23       Before age of 3  . Hyperlipidemia Father   . Heart disease Brother        Heart dissease before age 22  . Hyperlipidemia Brother   . Cirrhosis Mother   . Heart attack Paternal Grandfather   . Heart disease Paternal Grandfather   . Cancer Maternal Grandmother   . Alcoholism Maternal Grandfather   . Heart attack Paternal Grandmother     Social History   Tobacco Use  . Smoking status: Never Smoker  . Smokeless tobacco: Never Used  Vaping Use  . Vaping Use: Never used  Substance Use Topics  . Alcohol use: No    Alcohol/week: 0.0 standard drinks  . Drug use: No    Home Medications Prior to Admission medications   Medication Sig Start Date End Date Taking? Authorizing Provider  acetaminophen (TYLENOL) 325 MG tablet Take 650 mg by mouth every 6 (six) hours as needed for moderate pain.    [provider]  amLODipine (NORVASC) 5 MG tablet Take 1 tablet (5 mg total) by mouth daily. 11/08/16   Bonnielee Haff, MD  Ascorbic Acid (VITAMIN C) 1000 MG tablet Take 1,000 mg by mouth daily.    [provider]  aspirin EC 81 MG tablet Take 81 mg by mouth every  evening.     [provider]  Calcium Carbonate-Vitamin D (CALTRATE 600+D PO) Take 1 tablet by mouth daily.    [provider]  carvedilol (COREG) 25 MG tablet TAKE ONE TABLET BY MOUTH TWICE A DAY 01/24/20   Jettie Booze, MD  cholecalciferol (VITAMIN D3) 25 MCG (1000 UT) tablet Take 1,000 Units by mouth daily.    [provider]  citalopram (CELEXA) 20 MG tablet Take 20 mg by mouth every morning.     [provider]  clopidogrel (PLAVIX) 75 MG tablet Take 75 mg by mouth every morning.     [provider]  Cyanocobalamin (B-12) 2500 MCG TABS Take 2,500 mcg by mouth daily.    [provider]  FERROUS SULFATE PO Take 325 mg by mouth daily.    [provider]  fish oil-omega-3 fatty acids 1000 MG capsule Take 1 g by mouth daily.    [provider]  furosemide (LASIX) 20 MG tablet Take 20 mg by mouth daily as needed for fluid or edema.  11/27/17   [provider]  gabapentin (NEURONTIN) 300 MG capsule Take 300 mg by mouth 4 (four) times daily.  02/28/16   [provider]  glipiZIDE (GLUCOTROL XL) 10 MG 24 hr tablet Take 10 mg by mouth daily with breakfast.     [provider]  HYDROcodone-acetaminophen (NORCO) 10-325 MG tablet Take 1 tablet by mouth 2 (two) times daily as needed for severe pain.  10/25/16   [provider]  lisinopril (ZESTRIL) 5 MG tablet Take 5 mg by mouth daily.    [provider]  Magnesium Oxide 250 MG TABS Take 1 tablet by mouth daily.    [provider]  meclizine (ANTIVERT) 25 MG tablet Take 25 mg by mouth 2 (two) times daily as needed for dizziness. For dizziness    [provider]  methocarbamol (ROBAXIN) 500 MG tablet Take 1 tablet (500 mg total) by mouth every 8 (eight) hours as needed for muscle spasms. 08/06/16   Mikhail, Velta Addison, DO  nitroGLYCERIN (NITROSTAT) 0.4 MG SL tablet Place 1 tablet (0.4 mg total) under the tongue every 5  (five) minutes as needed. Chest pain. 01/18/19   Jettie Booze, MD  pantoprazole (PROTONIX) 40 MG tablet Take 40 mg by mouth daily as needed (INDIGESTION).  01/10/15   [provider]  promethazine (PHENERGAN) 12.5 MG tablet Take 12.5 mg by mouth every 6 (six) hours as needed for nausea or vomiting.    [provider]  rosuvastatin (CRESTOR) 5 MG tablet Take 5 mg by mouth daily. Takes on Monday and Friday    [provider]    Allergies    Codeine, Latex, Morphine and related, Cyclobenzaprine, and Methocarbamol  Review of Systems   Review of Systems  Unable to perform ROS: Acuity of condition  Neurological: Positive for dizziness.    Physical Exam Updated Vital Signs BP (!) 156/75   Pulse 86   Temp 98.9 F (37.2 C) (Oral)   Resp 15   SpO2 92%   Physical Exam Vitals and nursing note reviewed.  Constitutional:      General: She is not in acute distress.    Appearance: She is well-developed. She is not diaphoretic.  HENT:     Head: Normocephalic and atraumatic.  Eyes:     Pupils: Pupils are equal, round, and reactive to light.  Cardiovascular:     Rate and Rhythm: Normal rate and regular rhythm.     Heart sounds: No murmur heard. No friction rub. No gallop.   Pulmonary:     Effort: Pulmonary effort is normal.     Breath sounds: No wheezing or rales.  Abdominal:     General: There is no distension.     Palpations: Abdomen is soft.     Tenderness: There is no abdominal tenderness.  Musculoskeletal:        General: No tenderness.     Cervical back: Normal range of motion and neck supple.  Skin:    General: Skin is warm and dry.  Neurological:     Mental Status: She is alert and oriented to person, place, and time.  Psychiatric:  Behavior: Behavior normal.     ED Results / Procedures / Treatments   Labs (all labs ordered are listed, but only abnormal results are displayed) Labs Reviewed  CBC - Abnormal; Notable for the  following components:      Result Value   RBC 2.99 (*)    Hemoglobin 11.8 (*)    MCV 123.1 (*)    MCH 39.5 (*)    nRBC 2.2 (*)    All other components within normal limits  COMPREHENSIVE METABOLIC PANEL - Abnormal; Notable for the following components:   Potassium 5.3 (*)    Glucose, Bld 230 (*)    BUN 38 (*)    Creatinine, Ser 1.42 (*)    AST 73 (*)    ALT 72 (*)    GFR, Estimated 38 (*)    All other components within normal limits  I-STAT CHEM 8, ED - Abnormal; Notable for the following components:   Potassium 5.2 (*)    BUN 40 (*)    Creatinine, Ser 1.30 (*)    Glucose, Bld 210 (*)    All other components within normal limits  PROTIME-INR  APTT  DIFFERENTIAL  AMMONIA  URINALYSIS, ROUTINE W REFLEX MICROSCOPIC  CBG MONITORING, ED    EKG EKG Interpretation  Date/Time:  Sunday April 16 2020 08:42:20 EDT Ventricular Rate:  87 PR Interval:  170 QRS Duration: 102 QT Interval:  393 QTC Calculation: 473 R Axis:   71 Text Interpretation: Sinus rhythm Consider RVH w/ secondary repol abnormality Probable lateral infarct, age indeterminate isolated stdepression v3 Otherwise no significant change Confirmed by Deno Etienne [phone]) on 04/16/2020 8:45:03 AM   Radiology CT ANGIO HEAD W OR WO CONTRAST  Result Date: 04/16/2020 CLINICAL DATA:  Stroke suspected EXAM: CT ANGIOGRAPHY HEAD AND NECK CT PERFUSION BRAIN TECHNIQUE: Multidetector CT imaging of the head and neck was performed using the standard protocol during bolus administration of intravenous contrast. Multiplanar CT image reconstructions and MIPs were obtained to evaluate the vascular anatomy. Carotid stenosis measurements (when applicable) are obtained utilizing NASCET criteria, using the distal internal carotid diameter as the denominator. Multiphase CT imaging of the brain was performed following IV bolus contrast injection. Subsequent parametric perfusion maps were calculated using RAPID software. CONTRAST:  Dose is  currently not known COMPARISON:  Noncontrast head CT from earlier today FINDINGS: CTA NECK FINDINGS Aortic arch: Atheromatous plaque.  Three vessel branching. Right carotid system: Partial retropharyngeal course. Suspect carotid endarterectomy. No stenosis or ulceration. Left carotid system: Partial retropharyngeal course of the ICAs. Suspect endarterectomy. No focal or flow limiting stenosis. Vertebral arteries: Proximal left subclavian atherosclerosis with stenosis exacerbated by kink. Luminal narrowing is 80% as measured on coronal reformats. Calcified plaque at the bilateral vertebral origin with high-grade stenoses. No dissection. Skeleton: No acute finding Other neck: Postoperative changes in the bilateral neck. No acute finding. Upper chest: No acute finding Review of the MIP images confirms the above findings CTA HEAD FINDINGS Anterior circulation: Heavily calcified bilateral supraclinoid ICA which limits luminal visualization due to calcified plaque blooming. No flow limiting stenosis by measurement. Overall mild atheromatous irregularity of branches without branch occlusion or proximal focal stenosis. Negative for aneurysm Posterior circulation: Extensive atheromatous plaque to the bilateral V4 segments with high-grade narrowing on the left, pre occlusive. The basilar is smooth and widely patent. High-grade right P1 segment stenosis. No PCA branch occlusion or asymmetric flow. Venous sinuses: Unremarkable for contrast time Anatomic variants: None significant Review of the MIP images confirms the above findings  CT Brain Perfusion Findings: ASPECTS: 10 CBF (<30%) Volume: 72m Perfusion (Tmax>6.0s) volume: 068mIMPRESSION: 1. No emergent finding.  No perfusion deficit. 2. 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 High-grade bilateral vertebral origin stenosis. High-grade left V4 segment stenosis. 3. Carotid endarterectomy on both sides. No proximal flow limiting stenosis in the anterior  circulation. 4. High-grade right P1 segment stenosis. Electronically Signed   By: JoMonte Fantasia.D.   On: 04/16/2020 08:36   CT ANGIO NECK W OR WO CONTRAST  Result Date: 04/16/2020 CLINICAL DATA:  Stroke suspected EXAM: CT ANGIOGRAPHY HEAD AND NECK CT PERFUSION BRAIN TECHNIQUE: Multidetector CT imaging of the head and neck was performed using the standard protocol during bolus administration of intravenous contrast. Multiplanar CT image reconstructions and MIPs were obtained to evaluate the vascular anatomy. Carotid stenosis measurements (when applicable) are obtained utilizing NASCET criteria, using the distal internal carotid diameter as the denominator. Multiphase CT imaging of the brain was performed following IV bolus contrast injection. Subsequent parametric perfusion maps were calculated using RAPID software. CONTRAST:  Dose is currently not known COMPARISON:  Noncontrast head CT from earlier today FINDINGS: CTA NECK FINDINGS Aortic arch: Atheromatous plaque.  Three vessel branching. Right carotid system: Partial retropharyngeal course. Suspect carotid endarterectomy. No stenosis or ulceration. Left carotid system: Partial retropharyngeal course of the ICAs. Suspect endarterectomy. No focal or flow limiting stenosis. Vertebral arteries: Proximal left subclavian atherosclerosis with stenosis exacerbated by kink. Luminal narrowing is 80% as measured on coronal reformats. Calcified plaque at the bilateral vertebral origin with high-grade stenoses. No dissection. Skeleton: No acute finding Other neck: Postoperative changes in the bilateral neck. No acute finding. Upper chest: No acute finding Review of the MIP images confirms the above findings CTA HEAD FINDINGS Anterior circulation: Heavily calcified bilateral supraclinoid ICA which limits luminal visualization due to calcified plaque blooming. No flow limiting stenosis by measurement. Overall mild atheromatous irregularity of branches without branch  occlusion or proximal focal stenosis. Negative for aneurysm Posterior circulation: Extensive atheromatous plaque to the bilateral V4 segments with high-grade narrowing on the left, pre occlusive. The basilar is smooth and widely patent. High-grade right P1 segment stenosis. No PCA branch occlusion or asymmetric flow. Venous sinuses: Unremarkable for contrast time Anatomic variants: None significant Review of the MIP images confirms the above findings CT Brain Perfusion Findings: ASPECTS: 10 CBF (<30%) Volume: 60m52merfusion (Tmax>6.0s) volume: 60mL55mPRESSION: 1. No emergent finding.  No perfusion deficit. 2. 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 High-grade bilateral vertebral origin stenosis. High-grade left V4 segment stenosis. 3. Carotid endarterectomy on both sides. No proximal flow limiting stenosis in the anterior circulation. 4. High-grade right P1 segment stenosis. Electronically Signed   By: JonaMonte Fantasia.   On: 04/16/2020 08:36   MR BRAIN WO CONTRAST  Result Date: 04/16/2020 CLINICAL DATA:  Fall this morning with head injury EXAM: MRI HEAD WITHOUT CONTRAST TECHNIQUE: Multiplanar, multiecho pulse sequences of the brain and surrounding structures were obtained without intravenous contrast. COMPARISON:  Head CT and CTA from earlier today FINDINGS: Brain: Subcentimeter acute infarct in the lateral left temporal cortex. Punctate acute or subacute infarct along the left parietal cortex. On coronal diffusion there is an acute or subacute infarct in the right occipital white matter. No evidence of hemorrhage, hydrocephalus, collection, or mass. Only diffusion and sagittal T1 weighted images were acquired due to claustrophobia. T1 weighted imaging is motion degraded Vascular: No findings on the above sequences Skull and upper cervical spine: No  evidence of focal lesion. Posterior scalp swelling. IMPRESSION: 1. Small acute infarct in the left temporal cortex. Punctate acute or  subacute infarcts in the left parietal and right occipital cortex. 2. Truncated study due to claustrophobia. Electronically Signed   By: Monte Fantasia M.D.   On: 04/16/2020 09:50   CT C-SPINE NO CHARGE  Result Date: 04/16/2020 CLINICAL DATA:  Fall. EXAM: CT CERVICAL SPINE WITHOUT CONTRAST TECHNIQUE: Multidetector CT imaging of the cervical spine was performed without intravenous contrast. Multiplanar CT image reconstructions were also generated. COMPARISON:  None. FINDINGS: Alignment: Straightening of normal cervical lordosis without subluxation. Skull base and vertebrae: No acute fracture. No primary bone lesion or focal pathologic process. Soft tissues and spinal canal: No prevertebral fluid or swelling. No visible canal hematoma. Disc levels: Loss of disc height noted at all levels from C3-4 to C7-T1 with associated endplate degeneration. Facets are well aligned bilaterally. Upper chest: Unremarkable. Other: None. IMPRESSION: 1. Degenerative changes in the cervical spine without evidence for an acute fracture. 2. Loss of cervical lordosis. This can be related to patient positioning, muscle spasm or soft tissue injury. Electronically Signed   By: Misty Stanley M.D.   On: 04/16/2020 09:13   CT CEREBRAL PERFUSION W CONTRAST  Result Date: 04/16/2020 CLINICAL DATA:  Stroke suspected EXAM: CT ANGIOGRAPHY HEAD AND NECK CT PERFUSION BRAIN TECHNIQUE: Multidetector CT imaging of the head and neck was performed using the standard protocol during bolus administration of intravenous contrast. Multiplanar CT image reconstructions and MIPs were obtained to evaluate the vascular anatomy. Carotid stenosis measurements (when applicable) are obtained utilizing NASCET criteria, using the distal internal carotid diameter as the denominator. Multiphase CT imaging of the brain was performed following IV bolus contrast injection. Subsequent parametric perfusion maps were calculated using RAPID software. CONTRAST:  Dose is  currently not known COMPARISON:  Noncontrast head CT from earlier today FINDINGS: CTA NECK FINDINGS Aortic arch: Atheromatous plaque.  Three vessel branching. Right carotid system: Partial retropharyngeal course. Suspect carotid endarterectomy. No stenosis or ulceration. Left carotid system: Partial retropharyngeal course of the ICAs. Suspect endarterectomy. No focal or flow limiting stenosis. Vertebral arteries: Proximal left subclavian atherosclerosis with stenosis exacerbated by kink. Luminal narrowing is 80% as measured on coronal reformats. Calcified plaque at the bilateral vertebral origin with high-grade stenoses. No dissection. Skeleton: No acute finding Other neck: Postoperative changes in the bilateral neck. No acute finding. Upper chest: No acute finding Review of the MIP images confirms the above findings CTA HEAD FINDINGS Anterior circulation: Heavily calcified bilateral supraclinoid ICA which limits luminal visualization due to calcified plaque blooming. No flow limiting stenosis by measurement. Overall mild atheromatous irregularity of branches without branch occlusion or proximal focal stenosis. Negative for aneurysm Posterior circulation: Extensive atheromatous plaque to the bilateral V4 segments with high-grade narrowing on the left, pre occlusive. The basilar is smooth and widely patent. High-grade right P1 segment stenosis. No PCA branch occlusion or asymmetric flow. Venous sinuses: Unremarkable for contrast time Anatomic variants: None significant Review of the MIP images confirms the above findings CT Brain Perfusion Findings: ASPECTS: 10 CBF (<30%) Volume: 103m Perfusion (Tmax>6.0s) volume: 069mIMPRESSION: 1. No emergent finding.  No perfusion deficit. 2. 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 High-grade bilateral vertebral origin stenosis. High-grade left V4 segment stenosis. 3. Carotid endarterectomy on both sides. No proximal flow limiting stenosis in the anterior  circulation. 4. High-grade right P1 segment stenosis. Electronically Signed   By: JoMonte Fantasia.D.   On: 04/16/2020  08:36   CT HEAD CODE STROKE WO CONTRAST  Result Date: 04/16/2020 CLINICAL DATA:  Code stroke.  Slurred speech EXAM: CT HEAD WITHOUT CONTRAST TECHNIQUE: Contiguous axial images were obtained from the base of the skull through the vertex without intravenous contrast. COMPARISON:  06/07/2019 FINDINGS: Brain: No evidence of acute infarction, hemorrhage, hydrocephalus, extra-axial collection or mass lesion/mass effect. Small remote left cerebellar infarct. Vascular: No hyperdense vessel or unexpected calcification. Skull: Normal. Negative for fracture or focal lesion. Sinuses/Orbits: No acute finding.  Bilateral cataract resection Other: These results were communicated to Dr. Rory Percy at 8:17 amon 4/10/2022by text page via the [facility] messaging system. ASPECTS [facility] Stroke Program Early CT Score) - Ganglionic level infarction (caudate, lentiform nuclei, internal capsule, insula, M1-M3 cortex): 7 - Supraganglionic infarction (M4-M6 cortex): 3 Total score (0-10 with 10 being normal): 10 IMPRESSION: 1. No acute finding. 2. Small remote left cerebellar infarct. Electronically Signed   By: Monte Fantasia M.D.   On: 04/16/2020 08:18    Procedures Procedures   Medications Ordered in ED Medications  sodium chloride flush (NS) 0.9 % injection 3 mL (has no administration in time range)  sodium chloride 0.9 % bolus 1,000 mL (has no administration in time range)  iohexol (OMNIPAQUE) 350 MG/ML injection 100 mL (100 mLs Intravenous Contrast Given 04/16/20 0841)    ED Course  I have reviewed the triage vital signs and the nursing notes.  Pertinent labs & imaging results that were available during my care of the patient were reviewed by me and considered in my medical decision making (see chart for details).    MDM Rules/Calculators/A&P                          80 yo F arrived as code stroke,  dizziness frequent falls.  Taken urgently to CT.   CT imaging is unremarkable for acute pathology.  Patient felt not to be a candidate for TPA.  Plan for MRI and infectious work-up.  Patient appears somewhat dehydrated on my exam will give a bolus of IV fluids.  Oral trial.   MRI with concern for an acute stroke.  Will discuss with hospitalist for admission.  The patients results and plan were reviewed and discussed.   Any x-rays performed were independently reviewed by myself.   Differential diagnosis were considered with the presenting HPI.  Medications  sodium chloride flush (NS) 0.9 % injection 3 mL (has no administration in time range)  sodium chloride 0.9 % bolus 1,000 mL (has no administration in time range)  iohexol (OMNIPAQUE) 350 MG/ML injection 100 mL (100 mLs Intravenous Contrast Given 04/16/20 0841)    Vitals:   04/16/20 0838  BP: (!) 156/75  Pulse: 86  Resp: 15  Temp: 98.9 F (37.2 C)  TempSrc: Oral  SpO2: 92%    Final diagnoses:  Acute stroke due to ischemia [facility]    Admission/ observation were discussed with the admitting physician, patient and/or family and they are comfortable with the plan.    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

## 2020-04-16 NOTE — Progress Notes (Addendum)
Received pt from ED, pt appears calm in no apparent distress, denies dizziness, speech clear, GCS 15, NIH 0.

## 2020-04-16 NOTE — H&P (Signed)
History and Physical    Andrea Kirk JOA:[id number] DOB: 02-May-1940 DOA: 04/16/2020  PCP: Aura Dials, MD  Patient coming from: Home  Chief Complaint: Altered mental status and fall  HPI: Andrea Kirk is a 80 y.o. female with medical history significant of carotid artery disease, coronary artery disease, diabetes, hypertension comes in with a fall and altered mental status.  She lives at an assisted living.  When she was found she was found to be slurred speech and weak and confused.  Code stroke was called.  Patient found to have an infarct on her MRI.  Found to not be a TPA candidate.  Neurology service has seen the patient and patient being referred for admission for further stroke work-up.  Review of Systems: As per HPI otherwise 10 point review of systems negative.  However mildly confused  Past Medical History:  Diagnosis Date  . Acute kidney injury ([place]) 08/05/2016  . Aftercare following surgery of the circulatory system, [place] 05/11/2013  . AKI (acute kidney injury) ([place]) 08/04/2016  . Anxiety    takes Celexa daily  . ARF (acute renal failure) ([place]) 11/07/2016  . Asymptomatic stenosis of right carotid artery 03/22/2016  . Back pain    occasionally  . Carotid artery disease ([place]) 04/16/2013  . Carotid artery occlusion   . Carotid stenosis 11/16/2013  . Cataract    left and immature  . Coronary artery disease   . Coronary atherosclerosis of native coronary artery 03/11/2013   S/p CABG in 1997   . Depression   . Diabetes mellitus    takes Metformin and Glipizide daily  . Diabetes mellitus ([place]) 05/02/2015  . Dizziness    takes Meclizine daily as needed  . Essential hypertension, benign 03/11/2013  . GERD (gastroesophageal reflux disease)    takes Omeprazole daily as needed  . Headache(784.0)   . Hyperlipidemia    takes Atorvastatin daily  . Hypertension    takes Carvedilol daily  . Mixed hyperlipidemia 03/11/2013  . Muscle spasm    takes Robaxin daily as needed  . Nausea     takes Phenergan daily as needed  . Occlusion and stenosis of carotid artery without mention of cerebral infarction 07/19/2011  . Pneumonia    hx of-in high school  . Restless leg    takes Requip daily as needed  . Seasonal allergies    takes Claritin daily as needed and Afrin as needed  . Shortness of breath    with exertion  . Urinary urgency   . UTI (urinary tract infection) 11/07/2016    Past Surgical History:  Procedure Laterality Date  . COLONOSCOPY WITH PROPOFOL N/A 03/10/2012   Procedure: COLONOSCOPY WITH PROPOFOL;  Surgeon: Garlan Fair, MD;  Location: [facility];  Service: Endoscopy;  Laterality: N/A;  . CORNEAL TRANSPLANT Right   . CORONARY ARTERY BYPASS GRAFT  1997   x 6  . CORONARY ARTERY BYPASS GRAFT  Jan. 1997  . ENDARTERECTOMY Left 04/16/2013   Procedure: Left Carotid Artery Endatarectomy with Resection of Redundant Internal Carotid Artery;  Surgeon: Rosetta Posner, MD;  Location: [place];  Service: Vascular;  Laterality: Left;  . ENDARTERECTOMY Right 03/22/2016   Procedure: RIGHT ENDARTERECTOMY CAROTID;  Surgeon: Rosetta Posner, MD;  Location: [facility] OR;  Service: Vascular;  Laterality: Right;  . ESOPHAGOGASTRODUODENOSCOPY N/A 03/10/2012   Procedure: ESOPHAGOGASTRODUODENOSCOPY (EGD);  Surgeon: Garlan Fair, MD;  Location: [facility];  Service: Endoscopy;  Laterality: N/A;  . EYE SURGERY  March 12, 2001   CORNEA TRANSPLANT Right eye  . PATCH ANGIOPLASTY Right 03/22/2016   Procedure: PATCH ANGIOPLASTY;  Surgeon: Rosetta Posner, MD;  Location: [place];  Service: Vascular;  Laterality: Right;  . PR VEIN BYPASS GRAFT,AORTO-FEM-POP  1997  . SPINE SURGERY  march 2013   Back surgery  . TONSILLECTOMY    . TRIGGER FINGER RELEASE Left    thumb     reports that she has never smoked. She has never used smokeless tobacco. She reports that she does not drink alcohol and does not use drugs.  Allergies  Allergen Reactions  . Codeine Other (See Comments)    Abnormal behavior  .  Latex Other (See Comments)    tears skin  . Morphine And Related Other (See Comments)    Affects BP and blood sugar.  . Cyclobenzaprine     MADE PT SICK- pt unsure if med was cyclobenzaprine or methocarbamol   . Methocarbamol Other (See Comments)    MADE PT SICK- pt unsure if med was cyclobenzaprine or methocarbamol     Family History  Problem Relation Age of Onset  . Heart attack Father   . Heart disease Father 55       Before age of 56  . Hyperlipidemia Father   . Heart disease Brother        Heart dissease before age 6  . Hyperlipidemia Brother   . Cirrhosis Mother   . Heart attack Paternal Grandfather   . Heart disease Paternal Grandfather   . Cancer Maternal Grandmother   . Alcoholism Maternal Grandfather   . Heart attack Paternal Grandmother     Prior to Admission medications   Medication Sig Start Date End Date Taking? Authorizing Provider  acetaminophen (TYLENOL) 325 MG tablet Take 650 mg by mouth every 6 (six) hours as needed for moderate pain.    [provider]  amLODipine (NORVASC) 5 MG tablet Take 1 tablet (5 mg total) by mouth daily. 11/08/16   Bonnielee Haff, MD  Ascorbic Acid (VITAMIN C) 1000 MG tablet Take 1,000 mg by mouth daily.    [provider]  aspirin EC 81 MG tablet Take 81 mg by mouth every evening.     [provider]  Calcium Carbonate-Vitamin D (CALTRATE 600+D PO) Take 1 tablet by mouth daily.    [provider]  carvedilol (COREG) 25 MG tablet TAKE ONE TABLET BY MOUTH TWICE A DAY 01/24/20   Jettie Booze, MD  cholecalciferol (VITAMIN D3) 25 MCG (1000 UT) tablet Take 1,000 Units by mouth daily.    [provider]  citalopram (CELEXA) 20 MG tablet Take 20 mg by mouth every morning.     [provider]  clopidogrel (PLAVIX) 75 MG tablet Take 75 mg by mouth every morning.     [provider]  Cyanocobalamin (B-12) 2500 MCG TABS Take 2,500 mcg by mouth daily.    [provider]  FERROUS SULFATE PO Take 325 mg by mouth daily.    [provider]  fish oil-omega-3 fatty acids 1000 MG capsule Take 1 g by mouth daily.    [provider]  furosemide (LASIX) 20 MG tablet Take 20 mg by mouth daily as needed for fluid or edema.  11/27/17   [provider]  gabapentin (NEURONTIN) 300 MG capsule Take 300 mg by mouth 4 (four) times daily.  02/28/16   [provider]  glipiZIDE (GLUCOTROL XL) 10 MG 24 hr tablet Take 10 mg by  mouth daily with breakfast.     [provider]  HYDROcodone-acetaminophen (NORCO) 10-325 MG tablet Take 1 tablet by mouth 2 (two) times daily as needed for severe pain.  10/25/16   [provider]  lisinopril (ZESTRIL) 5 MG tablet Take 5 mg by mouth daily.    [provider]  Magnesium Oxide 250 MG TABS Take 1 tablet by mouth daily.    [provider]  meclizine (ANTIVERT) 25 MG tablet Take 25 mg by mouth 2 (two) times daily as needed for dizziness. For dizziness    [provider]  methocarbamol (ROBAXIN) 500 MG tablet Take 1 tablet (500 mg total) by mouth every 8 (eight) hours as needed for muscle spasms. 08/06/16   Mikhail, Velta Addison, DO  nitroGLYCERIN (NITROSTAT) 0.4 MG SL tablet Place 1 tablet (0.4 mg total) under the tongue every 5 (five) minutes as needed. Chest pain. 01/18/19   Jettie Booze, MD  pantoprazole (PROTONIX) 40 MG tablet Take 40 mg by mouth daily as needed (INDIGESTION).  01/10/15   [provider]  promethazine (PHENERGAN) 12.5 MG tablet Take 12.5 mg by mouth every 6 (six) hours as needed for nausea or vomiting.    [provider]  rosuvastatin (CRESTOR) 5 MG tablet Take 5 mg by mouth daily. Takes on Monday and Friday    [provider]    Physical Exam: Vitals:   04/16/20 0838  BP: (!) 156/75  Pulse: 86  Resp: 15  Temp: 98.9 F (37.2 C)  TempSrc: Oral  SpO2: 92%      Constitutional: NAD, calm, comfortable Vitals:    04/16/20 0838  BP: (!) 156/75  Pulse: 86  Resp: 15  Temp: 98.9 F (37.2 C)  TempSrc: Oral  SpO2: 92%   Eyes: PERRL, lids and conjunctivae normal ENMT: Mucous membranes are moist. Posterior pharynx clear of any exudate or lesions.Normal dentition.  Neck: normal, supple, no masses, no thyromegaly Respiratory: clear to auscultation bilaterally, no wheezing, no crackles. Normal respiratory effort. No accessory muscle use.  Cardiovascular: Regular rate and rhythm, no murmurs / rubs / gallops. No extremity edema. 2+ pedal pulses. No carotid bruits.  Abdomen: no tenderness, no masses palpated. No hepatosplenomegaly. Bowel sounds positive.  Musculoskeletal: no clubbing / cyanosis. No joint deformity upper and lower extremities. Good ROM, no contractures. Normal muscle tone.  Skin: no rashes, lesions, ulcers. No induration Neurologic: CN 2-12 grossly intact. Sensation intact, DTR normal. Strength 5/5 in all 4.  Psychiatric: Mildly confused. Alert and oriented x 2. Normal mood.    Labs on Admission: I have personally reviewed following labs and imaging studies  CBC: Recent Labs  Lab 04/16/20 0802 04/16/20 0806  WBC 6.7  --   NEUTROABS 4.5  --   HGB 11.8* 12.6  HCT 36.8 37.0  MCV 123.1*  --   PLT 165  --    Basic Metabolic Panel: Recent Labs  Lab 04/16/20 0802 04/16/20 0806  NA 138 139  K 5.3* 5.2*  CL 101 101  CO2 28  --   GLUCOSE 230* 210*  BUN 38* 40*  CREATININE 1.42* 1.30*  CALCIUM 9.8  --    GFR: CrCl cannot be calculated (Unknown ideal weight.). Liver Function Tests: Recent Labs  Lab 04/16/20 0802  AST 73*  ALT 72*  ALKPHOS 49  BILITOT 1.2  PROT 6.7  ALBUMIN 3.7   No results for input(s): LIPASE, AMYLASE in the last 168 hours. No results for input(s): AMMONIA in the last 168 hours.  Coagulation Profile: Recent Labs  Lab 04/16/20 0802  INR 1.2   Cardiac Enzymes: No results for input(s): CKTOTAL, CKMB, CKMBINDEX, TROPONINI in the last 168 hours. BNP  (last 3 results) No results for input(s): PROBNP in the last 8760 hours. HbA1C: No results for input(s): HGBA1C in the last 72 hours. CBG: No results for input(s): GLUCAP in the last 168 hours. Lipid Profile: No results for input(s): CHOL, HDL, LDLCALC, TRIG, CHOLHDL, LDLDIRECT in the last 72 hours. Thyroid Function Tests: No results for input(s): TSH, T4TOTAL, FREET4, T3FREE, THYROIDAB in the last 72 hours. Anemia Panel: No results for input(s): VITAMINB12, FOLATE, FERRITIN, TIBC, IRON, RETICCTPCT in the last 72 hours. Urine analysis:    Component Value Date/Time   COLORURINE STRAW (A) 12/21/2017 1823   APPEARANCEUR CLEAR 12/21/2017 1823   LABSPEC 1.006 12/21/2017 1823   PHURINE 5.0 12/21/2017 1823   GLUCOSEU NEGATIVE 12/21/2017 1823   HGBUR NEGATIVE 12/21/2017 1823   BILIRUBINUR NEGATIVE 12/21/2017 1823   KETONESUR NEGATIVE 12/21/2017 1823   PROTEINUR NEGATIVE 12/21/2017 1823   UROBILINOGEN 1.0 06/17/2014 2145   NITRITE NEGATIVE 12/21/2017 1823   LEUKOCYTESUR NEGATIVE 12/21/2017 1823   Sepsis Labs: !!!!!!!!!!!!!!!!!!!!!!!!!!!!!!!!!!!!!!!!!!!! @LABRCNTIP (procalcitonin:4,lacticidven:4) )No results found for this or any previous visit (from the past 240 hour(s)).   Radiological Exams on Admission: CT ANGIO HEAD W OR WO CONTRAST  Result Date: 04/16/2020 CLINICAL DATA:  Stroke suspected EXAM: CT ANGIOGRAPHY HEAD AND NECK CT PERFUSION BRAIN TECHNIQUE: Multidetector CT imaging of the head and neck was performed using the standard protocol during bolus administration of intravenous contrast. Multiplanar CT image reconstructions and MIPs were obtained to evaluate the vascular anatomy. Carotid stenosis measurements (when applicable) are obtained utilizing NASCET criteria, using the distal internal carotid diameter as the denominator. Multiphase CT imaging of the brain was performed following IV bolus contrast injection. Subsequent parametric perfusion maps were calculated using RAPID  software. CONTRAST:  Dose is currently not known COMPARISON:  Noncontrast head CT from earlier today FINDINGS: CTA NECK FINDINGS Aortic arch: Atheromatous plaque.  Three vessel branching. Right carotid system: Partial retropharyngeal course. Suspect carotid endarterectomy. No stenosis or ulceration. Left carotid system: Partial retropharyngeal course of the ICAs. Suspect endarterectomy. No focal or flow limiting stenosis. Vertebral arteries: Proximal left subclavian atherosclerosis with stenosis exacerbated by kink. Luminal narrowing is 80% as measured on coronal reformats. Calcified plaque at the bilateral vertebral origin with high-grade stenoses. No dissection. Skeleton: No acute finding Other neck: Postoperative changes in the bilateral neck. No acute finding. Upper chest: No acute finding Review of the MIP images confirms the above findings CTA HEAD FINDINGS Anterior circulation: Heavily calcified bilateral supraclinoid ICA which limits luminal visualization due to calcified plaque blooming. No flow limiting stenosis by measurement. Overall mild atheromatous irregularity of branches without branch occlusion or proximal focal stenosis. Negative for aneurysm Posterior circulation: Extensive atheromatous plaque to the bilateral V4 segments with high-grade narrowing on the left, pre occlusive. The basilar is smooth and widely patent. High-grade right P1 segment stenosis. No PCA branch occlusion or asymmetric flow. Venous sinuses: Unremarkable for contrast time Anatomic variants: None significant Review of the MIP images confirms the above findings CT Brain Perfusion Findings: ASPECTS: 10 CBF (<30%) Volume: 47mL Perfusion (Tmax>6.0s) volume: 37mL IMPRESSION: 1. No emergent finding.  No perfusion deficit. 2. 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 High-grade bilateral vertebral origin stenosis. High-grade left V4 segment stenosis. 3. Carotid endarterectomy on both sides. No proximal flow  limiting stenosis in the anterior circulation. 4. High-grade  right P1 segment stenosis. Electronically Signed   By: Monte Fantasia M.D.   On: 04/16/2020 08:36   CT ANGIO NECK W OR WO CONTRAST  Result Date: 04/16/2020 CLINICAL DATA:  Stroke suspected EXAM: CT ANGIOGRAPHY HEAD AND NECK CT PERFUSION BRAIN TECHNIQUE: Multidetector CT imaging of the head and neck was performed using the standard protocol during bolus administration of intravenous contrast. Multiplanar CT image reconstructions and MIPs were obtained to evaluate the vascular anatomy. Carotid stenosis measurements (when applicable) are obtained utilizing NASCET criteria, using the distal internal carotid diameter as the denominator. Multiphase CT imaging of the brain was performed following IV bolus contrast injection. Subsequent parametric perfusion maps were calculated using RAPID software. CONTRAST:  Dose is currently not known COMPARISON:  Noncontrast head CT from earlier today FINDINGS: CTA NECK FINDINGS Aortic arch: Atheromatous plaque.  Three vessel branching. Right carotid system: Partial retropharyngeal course. Suspect carotid endarterectomy. No stenosis or ulceration. Left carotid system: Partial retropharyngeal course of the ICAs. Suspect endarterectomy. No focal or flow limiting stenosis. Vertebral arteries: Proximal left subclavian atherosclerosis with stenosis exacerbated by kink. Luminal narrowing is 80% as measured on coronal reformats. Calcified plaque at the bilateral vertebral origin with high-grade stenoses. No dissection. Skeleton: No acute finding Other neck: Postoperative changes in the bilateral neck. No acute finding. Upper chest: No acute finding Review of the MIP images confirms the above findings CTA HEAD FINDINGS Anterior circulation: Heavily calcified bilateral supraclinoid ICA which limits luminal visualization due to calcified plaque blooming. No flow limiting stenosis by measurement. Overall mild atheromatous  irregularity of branches without branch occlusion or proximal focal stenosis. Negative for aneurysm Posterior circulation: Extensive atheromatous plaque to the bilateral V4 segments with high-grade narrowing on the left, pre occlusive. The basilar is smooth and widely patent. High-grade right P1 segment stenosis. No PCA branch occlusion or asymmetric flow. Venous sinuses: Unremarkable for contrast time Anatomic variants: None significant Review of the MIP images confirms the above findings CT Brain Perfusion Findings: ASPECTS: 10 CBF (<30%) Volume: 75mL Perfusion (Tmax>6.0s) volume: 39mL IMPRESSION: 1. No emergent finding.  No perfusion deficit. 2. 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 High-grade bilateral vertebral origin stenosis. High-grade left V4 segment stenosis. 3. Carotid endarterectomy on both sides. No proximal flow limiting stenosis in the anterior circulation. 4. High-grade right P1 segment stenosis. Electronically Signed   By: Monte Fantasia M.D.   On: 04/16/2020 08:36   MR BRAIN WO CONTRAST  Result Date: 04/16/2020 CLINICAL DATA:  Fall this morning with head injury EXAM: MRI HEAD WITHOUT CONTRAST TECHNIQUE: Multiplanar, multiecho pulse sequences of the brain and surrounding structures were obtained without intravenous contrast. COMPARISON:  Head CT and CTA from earlier today FINDINGS: Brain: Subcentimeter acute infarct in the lateral left temporal cortex. Punctate acute or subacute infarct along the left parietal cortex. On coronal diffusion there is an acute or subacute infarct in the right occipital white matter. No evidence of hemorrhage, hydrocephalus, collection, or mass. Only diffusion and sagittal T1 weighted images were acquired due to claustrophobia. T1 weighted imaging is motion degraded Vascular: No findings on the above sequences Skull and upper cervical spine: No evidence of focal lesion. Posterior scalp swelling. IMPRESSION: 1. Small acute infarct in the  left temporal cortex. Punctate acute or subacute infarcts in the left parietal and right occipital cortex. 2. Truncated study due to claustrophobia. Electronically Signed   By: Monte Fantasia M.D.   On: 04/16/2020 09:50   CT C-SPINE NO CHARGE  Result Date: 04/16/2020  CLINICAL DATA:  Fall. EXAM: CT CERVICAL SPINE WITHOUT CONTRAST TECHNIQUE: Multidetector CT imaging of the cervical spine was performed without intravenous contrast. Multiplanar CT image reconstructions were also generated. COMPARISON:  None. FINDINGS: Alignment: Straightening of normal cervical lordosis without subluxation. Skull base and vertebrae: No acute fracture. No primary bone lesion or focal pathologic process. Soft tissues and spinal canal: No prevertebral fluid or swelling. No visible canal hematoma. Disc levels: Loss of disc height noted at all levels from C3-4 to C7-T1 with associated endplate degeneration. Facets are well aligned bilaterally. Upper chest: Unremarkable. Other: None. IMPRESSION: 1. Degenerative changes in the cervical spine without evidence for an acute fracture. 2. Loss of cervical lordosis. This can be related to patient positioning, muscle spasm or soft tissue injury. Electronically Signed   By: Misty Stanley M.D.   On: 04/16/2020 09:13   CT CEREBRAL PERFUSION W CONTRAST  Result Date: 04/16/2020 CLINICAL DATA:  Stroke suspected EXAM: CT ANGIOGRAPHY HEAD AND NECK CT PERFUSION BRAIN TECHNIQUE: Multidetector CT imaging of the head and neck was performed using the standard protocol during bolus administration of intravenous contrast. Multiplanar CT image reconstructions and MIPs were obtained to evaluate the vascular anatomy. Carotid stenosis measurements (when applicable) are obtained utilizing NASCET criteria, using the distal internal carotid diameter as the denominator. Multiphase CT imaging of the brain was performed following IV bolus contrast injection. Subsequent parametric perfusion maps were calculated using  RAPID software. CONTRAST:  Dose is currently not known COMPARISON:  Noncontrast head CT from earlier today FINDINGS: CTA NECK FINDINGS Aortic arch: Atheromatous plaque.  Three vessel branching. Right carotid system: Partial retropharyngeal course. Suspect carotid endarterectomy. No stenosis or ulceration. Left carotid system: Partial retropharyngeal course of the ICAs. Suspect endarterectomy. No focal or flow limiting stenosis. Vertebral arteries: Proximal left subclavian atherosclerosis with stenosis exacerbated by kink. Luminal narrowing is 80% as measured on coronal reformats. Calcified plaque at the bilateral vertebral origin with high-grade stenoses. No dissection. Skeleton: No acute finding Other neck: Postoperative changes in the bilateral neck. No acute finding. Upper chest: No acute finding Review of the MIP images confirms the above findings CTA HEAD FINDINGS Anterior circulation: Heavily calcified bilateral supraclinoid ICA which limits luminal visualization due to calcified plaque blooming. No flow limiting stenosis by measurement. Overall mild atheromatous irregularity of branches without branch occlusion or proximal focal stenosis. Negative for aneurysm Posterior circulation: Extensive atheromatous plaque to the bilateral V4 segments with high-grade narrowing on the left, pre occlusive. The basilar is smooth and widely patent. High-grade right P1 segment stenosis. No PCA branch occlusion or asymmetric flow. Venous sinuses: Unremarkable for contrast time Anatomic variants: None significant Review of the MIP images confirms the above findings CT Brain Perfusion Findings: ASPECTS: 10 CBF (<30%) Volume: 25mL Perfusion (Tmax>6.0s) volume: 93mL IMPRESSION: 1. No emergent finding.  No perfusion deficit. 2. 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 High-grade bilateral vertebral origin stenosis. High-grade left V4 segment stenosis. 3. Carotid endarterectomy on both sides. No proximal  flow limiting stenosis in the anterior circulation. 4. High-grade right P1 segment stenosis. Electronically Signed   By: Monte Fantasia M.D.   On: 04/16/2020 08:36   DG Chest Port 1 View  Result Date: 04/16/2020 CLINICAL DATA:  Encephalopathy with fall EXAM: PORTABLE CHEST 1 VIEW COMPARISON:  Jun 07, 2019 FINDINGS: There is no edema or airspace opacity. There is cardiomegaly with pulmonary vascularity within normal limits. The patient is status post coronary artery bypass grafting. No adenopathy evident. There is aortic atherosclerosis.  Bones appear osteoporotic. IMPRESSION: No edema or airspace opacity. Cardiomegaly with postoperative changes. Aortic Atherosclerosis (ICD10-I70.0). Osteoporosis. Electronically Signed   By: Lowella Grip III M.D.   On: 04/16/2020 10:29   DG Foot Complete Left  Result Date: 04/16/2020 CLINICAL DATA:  Pain following fall EXAM: LEFT FOOT - COMPLETE 3+ VIEW COMPARISON:  September 07, 2015 FINDINGS: Frontal, oblique, and lateral views were obtained. Bones are osteoporotic. No acute fracture or dislocation. Stable bony overgrowth along the lateral aspect of the proximal first metatarsal likely residua of prior trauma. There is hallux valgus deformity at the first MTP joint. There is mild narrowing of all PIP and D IP joints. No erosive changes. There is a small inferior calcaneal spur. IMPRESSION: Osteoporosis. No acute fracture or dislocation evident. Suspect prior trauma with bony remodeling along the lateral proximal aspect first metatarsal, stable. Hallux valgus deformity first MTP joint. Narrowing multiple distal joints. Small inferior calcaneal spur. Electronically Signed   By: Lowella Grip III M.D.   On: 04/16/2020 10:31   CT HEAD CODE STROKE WO CONTRAST  Result Date: 04/16/2020 CLINICAL DATA:  Code stroke.  Slurred speech EXAM: CT HEAD WITHOUT CONTRAST TECHNIQUE: Contiguous axial images were obtained from the base of the skull through the vertex without  intravenous contrast. COMPARISON:  06/07/2019 FINDINGS: Brain: No evidence of acute infarction, hemorrhage, hydrocephalus, extra-axial collection or mass lesion/mass effect. Small remote left cerebellar infarct. Vascular: No hyperdense vessel or unexpected calcification. Skull: Normal. Negative for fracture or focal lesion. Sinuses/Orbits: No acute finding.  Bilateral cataract resection Other: These results were communicated to Dr. Rory Percy at 8:17 amon 4/10/2022by text page via the [facility] messaging system. ASPECTS [facility] Stroke Program Early CT Score) - Ganglionic level infarction (caudate, lentiform nuclei, internal capsule, insula, M1-M3 cortex): 7 - Supraganglionic infarction (M4-M6 cortex): 3 Total score (0-10 with 10 being normal): 10 IMPRESSION: 1. No acute finding. 2. Small remote left cerebellar infarct. Electronically Signed   By: Monte Fantasia M.D.   On: 04/16/2020 08:18    EKG: Independently reviewed.  Normal sinus rhythm no acute changes Old chart reviewed Sachdeva discussed with EDP  Assessment/Plan  80 year old female with acute CVA left temporal cortex and punctate acute subacute infarct in the left parietal and right occipital cortex  Principal Problem:    CVA (cerebral vascular accident) (HCC)-obtain cardiac echo.  Frequent neurological checks.  Check hemoglobin A1c and fasting lipid panel.  Placed on aspirin.  Neurology team following.  Active Problems:    Hyperkalemia-mild.  Given a dose of Kayexalate in the ED.   Essential hypertension, benign-allow permissive hypertension in the setting of acute stroke    Diabetes mellitus (HCC)-sliding scale insulin    Chronic diastolic CHF (congestive heart failure) (HCC)-compensated and stable at this time    Frequent falls-obtain physical therapy consultation.   Further recommendations pending on overall spell course   DVT prophylaxis: Ambulate Code Status: Full Family Communication: None Disposition Plan: Days Consults called:  Neurology Admission status: Admission   Tacy Chavis A MD [facility]  If 7PM-7AM, please contact night-coverage [email or web address] Password [email or web address]  04/16/2020, 11:01 AM

## 2020-04-17 DIAGNOSIS — I5032 Chronic diastolic (congestive) heart failure: Secondary | ICD-10-CM

## 2020-04-17 DIAGNOSIS — I6389 Other cerebral infarction: Secondary | ICD-10-CM | POA: Diagnosis not present

## 2020-04-17 DIAGNOSIS — I6300 Cerebral infarction due to thrombosis of unspecified precerebral artery: Secondary | ICD-10-CM

## 2020-04-17 DIAGNOSIS — R296 Repeated falls: Secondary | ICD-10-CM

## 2020-04-17 DIAGNOSIS — E1159 Type 2 diabetes mellitus with other circulatory complications: Secondary | ICD-10-CM

## 2020-04-17 DIAGNOSIS — I639 Cerebral infarction, unspecified: Secondary | ICD-10-CM | POA: Diagnosis present

## 2020-04-17 DIAGNOSIS — I10 Essential (primary) hypertension: Secondary | ICD-10-CM | POA: Diagnosis not present

## 2020-04-17 DIAGNOSIS — E875 Hyperkalemia: Secondary | ICD-10-CM

## 2020-04-17 LAB — LIPID PANEL
Cholesterol: 131 mg/dL (ref 0–200)
HDL: 19 mg/dL — ABNORMAL LOW (ref >40)
LDL Cholesterol: 82 mg/dL (ref 0–99)
Total CHOL/HDL Ratio: 6.9 RATIO
Triglycerides: 149 mg/dL (ref <150)
VLDL: 30 mg/dL (ref 0–40)

## 2020-04-17 LAB — HEMOGLOBIN A1C
Hgb A1c MFr Bld: 6.9 % — ABNORMAL HIGH (ref 4.8–5.6)
Mean Plasma Glucose: 151.33 mg/dL

## 2020-04-17 LAB — GLUCOSE, CAPILLARY
Glucose-Capillary: 169 mg/dL — ABNORMAL HIGH (ref 70–99)
Glucose-Capillary: 189 mg/dL — ABNORMAL HIGH (ref 70–99)
Glucose-Capillary: 210 mg/dL — ABNORMAL HIGH (ref 70–99)
Glucose-Capillary: 255 mg/dL — ABNORMAL HIGH (ref 70–99)

## 2020-04-17 MED ORDER — FERROUS SULFATE 325 (65 FE) MG PO TABS
325.0000 mg | ORAL_TABLET | Freq: Every day | ORAL | Status: DC
  Administered 2020-04-18 – 2020-04-20 (×3): 325 mg via ORAL
  Filled 2020-04-17 (×3): qty 1

## 2020-04-17 MED ORDER — NITROGLYCERIN 0.4 MG SL SUBL: 0.4000 mg | SUBLINGUAL_TABLET | SUBLINGUAL | Status: DC | PRN

## 2020-04-17 MED ORDER — CARVEDILOL 25 MG PO TABS
25.0000 mg | ORAL_TABLET | Freq: Two times a day (BID) | ORAL | Status: DC
  Administered 2020-04-17 – 2020-04-20 (×6): 25 mg via ORAL
  Filled 2020-04-17 (×6): qty 1

## 2020-04-17 MED ORDER — ROSUVASTATIN CALCIUM 5 MG PO TABS
5.0000 mg | ORAL_TABLET | Freq: Every day | ORAL | Status: DC
  Administered 2020-04-17 – 2020-04-20 (×4): 5 mg via ORAL
  Filled 2020-04-17 (×5): qty 1

## 2020-04-17 MED ORDER — AMLODIPINE BESYLATE 5 MG PO TABS
5.0000 mg | ORAL_TABLET | Freq: Every day | ORAL | Status: DC
  Administered 2020-04-17 – 2020-04-20 (×4): 5 mg via ORAL
  Filled 2020-04-17 (×4): qty 1

## 2020-04-17 MED ORDER — FUROSEMIDE 20 MG PO TABS: 10.0000 mg | ORAL_TABLET | ORAL | Status: DC

## 2020-04-17 MED ORDER — SODIUM CHLORIDE 0.9 % IV SOLN: INTRAVENOUS | Status: DC

## 2020-04-17 MED ORDER — ASCORBIC ACID 500 MG PO TABS
1000.0000 mg | ORAL_TABLET | Freq: Every day | ORAL | Status: DC
  Administered 2020-04-17 – 2020-04-20 (×4): 1000 mg via ORAL
  Filled 2020-04-17 (×4): qty 2

## 2020-04-17 MED ORDER — MAGNESIUM OXIDE 250 MG PO TABS: 250.0000 mg | ORAL_TABLET | Freq: Every day | ORAL | Status: DC

## 2020-04-17 MED ORDER — PROMETHAZINE HCL 25 MG PO TABS: 12.5000 mg | ORAL_TABLET | Freq: Four times a day (QID) | ORAL | Status: DC | PRN

## 2020-04-17 MED ORDER — HYDROCODONE-ACETAMINOPHEN 10-325 MG PO TABS
1.0000 | ORAL_TABLET | Freq: Three times a day (TID) | ORAL | Status: DC | PRN
  Administered 2020-04-17 – 2020-04-20 (×7): 1 via ORAL
  Filled 2020-04-17 (×8): qty 1

## 2020-04-17 MED ORDER — CLOPIDOGREL BISULFATE 75 MG PO TABS: 75.0000 mg | ORAL_TABLET | Freq: Every morning | ORAL | Status: DC

## 2020-04-17 MED ORDER — ASPIRIN 81 MG PO CHEW
81.0000 mg | CHEWABLE_TABLET | Freq: Every day | ORAL | Status: DC
  Administered 2020-04-18 – 2020-04-20 (×3): 81 mg via ORAL
  Filled 2020-04-17 (×4): qty 1

## 2020-04-17 MED ORDER — TICAGRELOR 90 MG PO TABS
90.0000 mg | ORAL_TABLET | Freq: Two times a day (BID) | ORAL | Status: DC
  Administered 2020-04-18 – 2020-04-20 (×5): 90 mg via ORAL
  Filled 2020-04-17 (×5): qty 1

## 2020-04-17 MED ORDER — SODIUM ZIRCONIUM CYCLOSILICATE 5 G PO PACK
5.0000 g | PACK | Freq: Two times a day (BID) | ORAL | Status: AC
  Administered 2020-04-17 – 2020-04-18 (×2): 5 g via ORAL
  Filled 2020-04-17 (×2): qty 1

## 2020-04-17 MED ORDER — B-12 2500 MCG PO TABS: 2500.0000 ug | ORAL_TABLET | Freq: Every day | ORAL | Status: DC

## 2020-04-17 MED ORDER — GABAPENTIN 300 MG PO CAPS
300.0000 mg | ORAL_CAPSULE | Freq: Four times a day (QID) | ORAL | Status: DC
  Administered 2020-04-17 – 2020-04-20 (×11): 300 mg via ORAL
  Filled 2020-04-17 (×11): qty 1

## 2020-04-17 MED ORDER — PANTOPRAZOLE SODIUM 40 MG PO TBEC: 40.0000 mg | DELAYED_RELEASE_TABLET | Freq: Every day | ORAL | Status: DC | PRN

## 2020-04-17 MED ORDER — FERROUS SULFATE 220 (44 FE) MG/5ML PO ELIX
325.0000 mg | ORAL_SOLUTION | Freq: Every day | ORAL | Status: DC
  Filled 2020-04-17 (×2): qty 7.4

## 2020-04-17 MED ORDER — ONDANSETRON HCL 4 MG/2ML IJ SOLN
4.0000 mg | Freq: Four times a day (QID) | INTRAMUSCULAR | Status: DC | PRN
  Administered 2020-04-17: 4 mg via INTRAVENOUS
  Filled 2020-04-17: qty 2

## 2020-04-17 MED ORDER — MECLIZINE HCL 25 MG PO TABS
25.0000 mg | ORAL_TABLET | Freq: Two times a day (BID) | ORAL | Status: DC | PRN
  Filled 2020-04-17: qty 1

## 2020-04-17 MED ORDER — CITALOPRAM HYDROBROMIDE 20 MG PO TABS
20.0000 mg | ORAL_TABLET | Freq: Every morning | ORAL | Status: DC
  Administered 2020-04-18 – 2020-04-20 (×3): 20 mg via ORAL
  Filled 2020-04-17 (×3): qty 1

## 2020-04-17 MED ORDER — LISINOPRIL 5 MG PO TABS: 5.0000 mg | ORAL_TABLET | Freq: Every day | ORAL | Status: DC

## 2020-04-17 MED ORDER — TICAGRELOR 90 MG PO TABS
180.0000 mg | ORAL_TABLET | Freq: Once | ORAL | Status: AC
  Administered 2020-04-17: 180 mg via ORAL
  Filled 2020-04-17: qty 2

## 2020-04-17 NOTE — Evaluation (Signed)
Occupational Therapy Evaluation Patient Details Name: Andrea Kirk MRN: [id number] DOB: 11-Dec-1940 Today's Date: 04/17/2020    History of Present Illness Pt is a 80 y.o. F who presents after a fall with AMS. 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 Significant PMH: CAD, DM, HTN.   Clinical Impression   PTA, pt ambulatory with Rollator and Modified Independent with ADLs. Pt reports to OT that she resides in ALF and does not wear O2 at baseline though reported to PT, ILF and supplemental O2 dependence at baseline. Pt presents now with minor deficits in strength, endurance and cognition. Pt overall min guard at most for mobility to/from bathroom using RW, progressing to Supervision during session. Pt Setup for UB ADLs, Supervision for LB ADLs. Pt does require some cues for problem solving, attention, with noted perseveration on headache. Recommend HHOT follow-up and initial supervision for showering tasks to ensure safety. Plan to provide fall prevention handout and progress ADL endurance during next session.  Pt received on 2 L O2, denies wearing O2 at baseline though SpO2 80% after toileting task on RA (asymptomatic). With replacement of 2 L, SpO2 95%.     Follow Up Recommendations  Home health OT;Supervision - Intermittent (initial supervision for showering tasks to ensure safety)    Equipment Recommendations  3 in 1 bedside commode    Recommendations for Other Services       Precautions / Restrictions Precautions Precautions: Fall;Other (comment) Precaution Comments: monitor O2 Restrictions Weight Bearing Restrictions: No      Mobility Bed Mobility Overal bed mobility: Needs Assistance Bed Mobility: Supine to Sit;Sit to Supine     Supine to sit: Supervision;HOB elevated Sit to supine: Supervision   General bed mobility comments: Supervision with increased time.     Transfers Overall transfer level: Needs assistance Equipment used: Rolling walker (2 wheeled) Transfers: Sit to/from [facility] Sit to Stand: Supervision Stand pivot transfers: Supervision       General transfer comment: No LOB, increased time for problem solving hand placement    Balance Overall balance assessment: Needs assistance Sitting-balance support: Feet supported Sitting balance-Leahy Scale: Good     Standing balance support: Bilateral upper extremity supported Standing balance-Leahy Scale: Fair Standing balance comment: fair static standing for peri care, use of B UE support for mobility                           ADL either performed or assessed with clinical judgement   ADL Overall ADL's : Needs assistance/impaired Eating/Feeding: Independent;Sitting   Grooming: Set up;Sitting   Upper Body Bathing: Set up;Sitting   Lower Body Bathing: Supervison/ safety;Sit to/from stand;Sitting/lateral leans Lower Body Bathing Details (indicate cue type and reason): No physical assist needed in standing for peri care Upper Body Dressing : Set up;Sitting Upper Body Dressing Details (indicate cue type and reason): Setup to don clean hospital gown sitting EOB Lower Body Dressing: Supervision/safety;Sit to/from stand;Sitting/lateral leans   Toilet Transfer: Min guard;Ambulation;BSC;Regular Toilet;RW Toilet Transfer Details (indicate cue type and reason): min guard progressing to supervision with RW to bathroom. Placed BSC over toilet Toileting- Clothing Manipulation and Hygiene: Supervision/safety;Sitting/lateral lean;Sit to/from stand [place] - Clothing Manipulation Details (indicate cue type and reason): some decreased awareness in locating toilet paper, cues for problem solving     Functional mobility during ADLs: Min guard;Rolling walker General ADL Comments: Pt with minor deficits in  cognition, endurance and strength. Educated on use of BSC at  bedside during the night, placement over toilet to increase height or use as shower chair     Vision Baseline Vision/History: Wears glasses Wears Glasses: At all times Patient Visual Report: No change from baseline Vision Assessment?: No apparent visual deficits     Perception     Praxis      Pertinent Vitals/Pain Pain Assessment: Faces Faces Pain Scale: Hurts even more Pain Location: headache, neck Pain Descriptors / Indicators: Headache;Sore Pain Intervention(s): Monitored during session;Patient requesting pain meds-RN notified     Hand Dominance Right   Extremity/Trunk Assessment Upper Extremity Assessment Upper Extremity Assessment: Generalized weakness   Lower Extremity Assessment Lower Extremity Assessment: Defer to PT evaluation   Cervical / Trunk Assessment Cervical / Trunk Assessment: Kyphotic   Communication Communication Communication: No difficulties   Cognition Arousal/Alertness: Awake/alert Behavior During Therapy: WFL for tasks assessed/performed;Anxious Overall Cognitive Status: No family/caregiver present to determine baseline cognitive functioning                                 General Comments: Pt with some decreased awareness of capabilities/deficits, tangential comments, perseveration on headache. Cues for problem solving   General Comments  Pt received on 2 L O2, pt denies wearing O2 at baseline though on RA during toileting task, Spo2 80% (no SOB noted). With replacement of 2 L, SpO2 95%. HR WFL. Assisted pt in clean up after purewick use in bed. Once back in bed, pt reported need to urinate again, so assisted in mobilizing to bathroom.    Exercises     Shoulder Instructions      Home Living Family/patient expects to be discharged to:: Other (Comment) Living Arrangements: Alone Available Help at Discharge: Available PRN/intermittently Type of Home: Other(Comment) Home Access: Level entry     Home Layout: One level          Bathroom Toilet: Standard     Home Equipment: Walker - 4 wheels;Cane - single point   Additional Comments: Per PT eval, pt at ILF. Pt reports ALF today and that she does not use supplemental O2 at baseline.      Prior Functioning/Environment Level of Independence: Independent with assistive device(s)        Comments: Uses Rollator, gets meals delivered. Stands for showers, able to complete ADLs        OT Problem List: Decreased strength;Decreased activity tolerance;Impaired balance (sitting and/or standing);Decreased cognition;Decreased safety awareness      OT Treatment/Interventions: Self-care/ADL training;Therapeutic exercise;Energy conservation;DME and/or AE instruction;Therapeutic activities;Patient/family education;Balance training    OT Goals(Current goals can be found in the care plan section) Acute Rehab OT Goals Patient Stated Goal: get rid of headache OT Goal Formulation: With patient Time For Goal Achievement: 05/01/20 Potential to Achieve Goals: Good ADL Goals Pt Will Transfer to Toilet: with modified independence;ambulating Pt Will Perform Tub/Shower Transfer: Tub transfer;Shower transfer;with modified independence;ambulating;3 in 1 Pt/caregiver will Perform Home Exercise Program: Increased strength;Both right and left upper extremity;With theraband;Independently;With written HEP provided Additional ADL Goal #1: Pt to verbalize at least 2 fall prevention strategies to maximize safety with daily tasks Additional ADL Goal #2: Pt to increase activity tolerance > 5-7 min during functional tasks to improve overall endurance  OT Frequency: Min 2X/week   Barriers to D/C:            Co-evaluation  AM-PAC OT "6 Clicks" Daily Activity     Outcome Measure Help from another person eating meals?: None Help from another person taking care of personal grooming?: A Little Help from another person toileting, which includes using toliet, bedpan, or  urinal?: A Little Help from another person bathing (including washing, rinsing, drying)?: A Little Help from another person to put on and taking off regular upper body clothing?: A Little Help from another person to put on and taking off regular lower body clothing?: A Little 6 Click Score: 19   End of Session Equipment Utilized During Treatment: Rolling walker;Oxygen Nurse Communication: Mobility status;Patient requests pain meds  Activity Tolerance: Patient tolerated treatment well Patient left: in bed;with call bell/phone within reach;with bed alarm set  OT Visit Diagnosis: Unsteadiness on feet (R26.81);Other abnormalities of gait and mobility (R26.89);Muscle weakness (generalized) (M62.81);Other symptoms and signs involving cognitive function                Time: 5456-2563 OT Time Calculation (min): 26 min Charges:  OT General Charges $OT Visit: 1 Visit OT Evaluation $OT Eval Low Complexity: 1 Low OT Treatments $Self Care/Home Management : 8-22 mins  Malachy Chamber, OTR/L Acute Rehab Services Office: [phone]  Layla Maw 04/17/2020, 7:59 AM

## 2020-04-17 NOTE — Evaluation (Signed)
Speech Language Pathology Evaluation Patient Details Name: Andrea Kirk MRN: [id number] DOB: 1940-08-10 Today's Date: 04/17/2020 Time: 1450-1510 SLP Time Calculation (min) (ACUTE ONLY): 20 min  Problem List:  Patient Active Problem List   Diagnosis Date Noted  . CVA (cerebral vascular accident) ([place]) 04/16/2020  . Hyperkalemia 04/16/2020  . Follow-up examination after eye surgery 04/22/2019  . 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 Depression 12/22/2017  . Chronic pain 12/22/2017  . 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 Falls 12/22/2017  . Chronic diastolic CHF (congestive heart failure) ([place]) 12/22/2017  . Hypoxia 12/21/2017  . ARF (acute renal failure) ([place]) 11/07/2016  . UTI (urinary tract infection) 11/07/2016  . Acute kidney injury ([place]) 08/05/2016  . AKI (acute kidney injury) ([place]) 08/04/2016  . Frequent falls 07/16/2016  . Hypertension 07/16/2016  . Asymptomatic stenosis of right carotid artery 03/22/2016  . Diabetes mellitus ([place]) 05/02/2015  . Carotid stenosis 11/16/2013  . Aftercare following surgery of the circulatory system, [place] 05/11/2013  . Carotid artery disease ([place]) 04/16/2013  . Coronary artery disease 03/11/2013  . Essential hypertension, benign 03/11/2013  . Hyperlipidemia 03/11/2013  . Occlusion and stenosis of carotid artery without mention of cerebral infarction 07/19/2011   Past Medical History:  Past Medical History:  Diagnosis Date  . Acute kidney injury ([place]) 08/05/2016  . Aftercare following surgery of the circulatory system, [place] 05/11/2013  . AKI (acute kidney injury) ([place]) 08/04/2016  . Anxiety    takes Celexa daily  . ARF (acute renal failure) ([place]) 11/07/2016  . Asymptomatic stenosis of right carotid artery 03/22/2016  . Back pain    occasionally  . Carotid artery disease ([place]) 04/16/2013  . Carotid artery occlusion   . Carotid stenosis 11/16/2013  . Cataract    left and  immature  . Coronary artery disease   . Coronary atherosclerosis of native coronary artery 03/11/2013   S/p CABG in 1997   . Depression   . Diabetes mellitus    takes Metformin and Glipizide daily  . Diabetes mellitus ([place]) 05/02/2015  . Dizziness    takes Meclizine daily as needed  . Essential hypertension, benign 03/11/2013  . GERD (gastroesophageal reflux disease)    takes Omeprazole daily as needed  . Headache(784.0)   . Hyperlipidemia    takes Atorvastatin daily  . Hypertension    takes Carvedilol daily  . Mixed hyperlipidemia 03/11/2013  . Muscle spasm    takes Robaxin daily as needed  . Nausea    takes Phenergan daily as needed  . Occlusion and stenosis of carotid artery without mention of cerebral infarction 07/19/2011  . Pneumonia    hx of-in high school  . Restless leg    takes Requip daily as needed  . Seasonal allergies    takes Claritin daily as needed and Afrin as needed  . Shortness of breath    with exertion  . Urinary urgency   . UTI (urinary tract infection) 11/07/2016   Past Surgical History:  Past Surgical History:  Procedure Laterality Date  . COLONOSCOPY WITH PROPOFOL N/A 03/10/2012   Procedure: COLONOSCOPY WITH PROPOFOL;  Surgeon: Garlan Fair, MD;  Location: [facility];  Service: Endoscopy;  Laterality: N/A;  . CORNEAL TRANSPLANT Right   . CORONARY ARTERY BYPASS GRAFT  1997   x 6  . CORONARY ARTERY BYPASS GRAFT  Jan. 1997  . ENDARTERECTOMY Left 04/16/2013   Procedure: Left Carotid Artery Endatarectomy with Resection of Redundant Internal Carotid  Artery;  Surgeon: Rosetta Posner, MD;  Location: [facility];  Service: Vascular;  Laterality: Left;  . ENDARTERECTOMY Right 03/22/2016   Procedure: RIGHT ENDARTERECTOMY CAROTID;  Surgeon: Rosetta Posner, MD;  [facility];  Service: Vascular;  Laterality: Right;  . ESOPHAGOGASTRODUODENOSCOPY N/A 03/10/2012   Procedure: ESOPHAGOGASTRODUODENOSCOPY (EGD);  Surgeon: Garlan Fair, MD;  Location: [facility];  Service:  Endoscopy;  Laterality: N/A;  . EYE SURGERY  March 12, 2001   CORNEA TRANSPLANT Right eye  . PATCH ANGIOPLASTY Right 03/22/2016   Procedure: PATCH ANGIOPLASTY;  Surgeon: Rosetta Posner, MD;  Location: [facility];  Service: Vascular;  Laterality: Right;  . PR VEIN BYPASS GRAFT,AORTO-FEM-POP  1997  . SPINE SURGERY  march 2013   Back surgery  . TONSILLECTOMY    . TRIGGER FINGER RELEASE Left    thumb   HPI:  Pt is a 80 y.o. F who presents after a fall with AMS. 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 Significant PMH: CAD, DM, HTN.   Assessment / Plan / Recommendation Clinical Impression  Patient presents with a mild cognitive impairment however assessment was somewhat limited by patient's c/o pain and perseveration on wanting pain medication. She was fully oriented to time, place, situation. She was noticeably drowsy and said "If I dont get pain meds I'm going to fall asleep" and told SLP that she would be thinking more clearly when she gets her pain meds. Overall, patient exhibited difficulties in attention, anticipatory awareness and did not fully recall daily events. She was aware of PT and OT recommending SNF level rehab at discharge and did seem to agree with this plan. As patient is exhibiting what appears to be a mild cognitive impairment and question level of impact from back pain impacting her ability to focus, SLP not recommending acute level SLP intervention but is recommending evaluation and treatment if warranted at next venue of care (likely SNF)    SLP Assessment  SLP Recommendation/Assessment: All further Speech Lanaguage Pathology  needs can be addressed in the next venue of care SLP Visit Diagnosis: Cognitive communication deficit (R41.841)    Follow Up Recommendations  Skilled Nursing facility;24 hour supervision/assistance    Frequency and Duration   N/A        SLP  Evaluation Cognition  Overall Cognitive Status: Difficult to assess Arousal/Alertness: Awake/alert Orientation Level: Oriented X4 Attention: Focused Focused Attention: Impaired Focused Attention Impairment: Verbal basic;Verbal complex (focused on c/o pain) Memory: Impaired Memory Impairment: Retrieval deficit Awareness: Impaired Awareness Impairment: Intellectual impairment Problem Solving: Impaired Problem Solving Impairment: Verbal basic Behaviors: Perseveration Safety/Judgment: Impaired       Comprehension  Auditory Comprehension Overall Auditory Comprehension: Appears within functional limits for tasks assessed    Expression Expression Primary Mode of Expression: Verbal Verbal Expression Overall Verbal Expression: Appears within functional limits for tasks assessed   Oral / Motor  Oral Motor/Sensory Function Overall Oral Motor/Sensory Function: Within functional limits Motor Speech Overall Motor Speech: Appears within functional limits for tasks assessed   GO                    Sonia Baller, MA, CCC-SLP Speech Therapy [facility] Acute Rehab

## 2020-04-17 NOTE — Progress Notes (Addendum)
STROKE TEAM PROGRESS NOTE   INTERVAL HISTORY Her brother is on the phone. Patient reports and older brother confirms he is a retired Chief Executive Officer.  Patient reports that she pressed her life alert button after a fall. Patient reports that lost her balance and attempted to grab onto a towel rack, which did not help her, and she hit her head on the shower. Patient clarifies that she never has any LOC. Patient reports that she will have falls occasionally but her last "significant" fall that resulted in hospitalization was approx May 2021. Patient reports she does not know why she fell then, but recalls that she just "dropped." Patient's brother notes that patient has not been diagnosed with A fib but he himself has and the patient's family has a strong hx of cardiac disease.  Interestingly on exam today patient denies ever having slurred speech and remains adamant that she called EMD because of her fall.   Vitals:   04/17/20 0025 04/17/20 0520 04/17/20 0838 04/17/20 1143  BP: (!) 158/74 (!) 178/85 (!) 144/87 130/75  Pulse: 78 78 89 75  Resp: _0 Temp: 97.9 F (36.6 C) 97.8 F (36.6 C) 98.3 F (36.8 C) 98.4 F (36.9 C)  TempSrc: Oral Oral Oral Oral  SpO2: 99% 98% 98% 99%   CBC:  Recent Labs  Lab 04/16/20 0802 04/16/20 0806  WBC 6.7  --   NEUTROABS 4.5  --   HGB 11.8* 12.6  HCT 36.8 37.0  MCV 123.1*  --   PLT 165  --    Basic Metabolic Panel:  Recent Labs  Lab 04/16/20 0802 04/16/20 0806  NA 138 139  K 5.3* 5.2*  CL 101 101  CO2 28  --   GLUCOSE 230* 210*  BUN 38* 40*  CREATININE 1.42* 1.30*  CALCIUM 9.8  --    Lipid Panel:  Recent Labs  Lab 04/17/20 0327  CHOL 131  TRIG 149  HDL 19*  CHOLHDL 6.9  VLDL 30  LDLCALC 82   HgbA1c:  Recent Labs  Lab 04/17/20 0327  HGBA1C 6.9*   Urine Drug Screen: No results for input(s): LABOPIA, COCAINSCRNUR, LABBENZ, AMPHETMU, THCU, LABBARB in the last 168 hours.  Alcohol Level No results for input(s): ETH in the last  168 hours.  IMAGING past 24 hours ECHOCARDIOGRAM COMPLETE  Result Date: 04/16/2020    ECHOCARDIOGRAM REPORT   Patient Name:   Moriah L Labrie Date of Exam: 04/16/2020 Medical Rec #:  [id number]     Height:       65.0 in Accession #:    [id number]    Weight:       178.0 lb Date of Birth:  1940/11/22      BSA:          1.883 m Patient Age:    80 years      BP:           146/89 mmHg Patient Gender: F             HR:           75 bpm. Exam Location:  Inpatient Procedure: 2D Echo, 3D Echo, Cardiac Doppler and Color Doppler Indications:    Stroke  History:        Patient has prior history of Echocardiogram examinations, most                 recent 12/16/2017. CHF, CAD; Risk Factors:Hypertension and  Diabetes. Hypoxia.  Sonographer:    Roseanna Rainbow RDCS Referring Phys: [phone] RACHAL A DAVID  Sonographer Comments: Technically difficult study due to poor echo windows. Image acquisition challenging due to patient body habitus. Patient in pain, notified nursing staff. IMPRESSIONS  1. Left ventricular ejection fraction, by estimation, is 50 to 55%. Left ventricular ejection fraction by 3D volume is 51 %. The left ventricle has low normal function. The left ventricle has no regional wall motion abnormalities. There is moderate left  ventricular hypertrophy. Left ventricular diastolic parameters are consistent with Grade II diastolic dysfunction (pseudonormalization). Elevated left ventricular end-diastolic pressure.  2. Right ventricular systolic function is mildly reduced. The right ventricular size is mildly enlarged. There is severely elevated pulmonary artery systolic pressure. The estimated right ventricular systolic pressure is 16.5 mmHg.  3. The mitral valve is grossly normal. Moderate mitral valve regurgitation.  4. Tricuspid valve regurgitation is mild to moderate.  5. The aortic valve is grossly normal. Aortic valve regurgitation is not visualized. No aortic stenosis is present.  6. Aortic dilatation noted.  There is moderate dilatation of the ascending aorta, measuring 42 mm.  7. The inferior vena cava is dilated in size with <50% respiratory variability, suggesting right atrial pressure of 15 mmHg. FINDINGS  Left Ventricle: Left ventricular ejection fraction, by estimation, is 50 to 55%. Left ventricular ejection fraction by 3D volume is 51 %. The left ventricle has low normal function. The left ventricle has no regional wall motion abnormalities. The left ventricular internal cavity size was normal in size. There is moderate left ventricular hypertrophy. Left ventricular diastolic parameters are consistent with Grade II diastolic dysfunction (pseudonormalization). Elevated left ventricular end-diastolic pressure. Right Ventricle: The right ventricular size is mildly enlarged. Right vetricular wall thickness was not well visualized. Right ventricular systolic function is mildly reduced. There is severely elevated pulmonary artery systolic pressure. The tricuspid regurgitant velocity is 3.81 m/s, and with an assumed right atrial pressure of 15 mmHg, the estimated right ventricular systolic pressure is 53.7 mmHg. Left Atrium: Left atrial size was normal in size. Right Atrium: Right atrial size was normal in size. Pericardium: There is no evidence of pericardial effusion. Mitral Valve: The mitral valve is grossly normal. Moderate mitral valve regurgitation. MV peak gradient, 10.0 mmHg. The mean mitral valve gradient is 3.0 mmHg. Tricuspid Valve: The tricuspid valve is grossly normal. Tricuspid valve regurgitation is mild to moderate. Aortic Valve: The aortic valve is grossly normal. Aortic valve regurgitation is not visualized. No aortic stenosis is present. Pulmonic Valve: The pulmonic valve was grossly normal. Pulmonic valve regurgitation is not visualized. Aorta: Aortic dilatation noted. There is moderate dilatation of the ascending aorta, measuring 42 mm. Venous: The inferior vena cava is dilated in size with less  than 50% respiratory variability, suggesting right atrial pressure of 15 mmHg. IAS/Shunts: The atrial septum is grossly normal.  LEFT VENTRICLE PLAX 2D LVIDd:         4.10 cm         Diastology LVIDs:         2.30 cm         LV e' medial:    5.10 cm/s LV PW:         1.50 cm         LV E/e' medial:  28.2 LV IVS:        1.40 cm         LV e' lateral:   7.13 cm/s LVOT diam:     1.70  cm         LV E/e' lateral: 20.2 LV SV:         55 LV SV Index:   29 LVOT Area:     2.27 cm        3D Volume EF                                LV 3D EF:    Left                                             ventricular LV Volumes (MOD)                            ejection LV vol d, MOD    77.3 ml                    fraction by A2C:                                        3D volume LV vol d, MOD    48.1 ml                    is 51 %. A4C: LV vol s, MOD    27.6 ml A2C:                           3D Volume EF: LV vol s, MOD    17.6 ml       3D EF:        51 % A4C: LV SV MOD A2C:   49.7 ml LV SV MOD A4C:   48.1 ml LV SV MOD BP:    42.3 ml RIGHT VENTRICLE            IVC RV S prime:     7.20 cm/s  IVC diam: 2.30 cm TAPSE (M-mode): 1.5 cm LEFT ATRIUM             Index       RIGHT ATRIUM           Index LA diam:        4.60 cm 2.44 cm/m  RA Area:     17.80 cm LA Vol (A2C):   80.1 ml 42.55 ml/m RA Volume:   51.20 ml  27.20 ml/m LA Vol (A4C):   44.5 ml 23.64 ml/m LA Biplane Vol: 62.8 ml 33.36 ml/m  AORTIC VALVE LVOT Vmax:   126.00 cm/s LVOT Vmean:  90.200 cm/s LVOT VTI:    0.241 m  AORTA Ao Root diam: 3.00 cm Ao Asc diam:  4.20 cm MITRAL VALVE                 TRICUSPID VALVE MV Area (PHT): 5.75 cm      TR Peak grad:   58.1 mmHg MV Area VTI:   1.59 cm      TR Vmax:        381.00 cm/s MV Peak grad:  10.0 mmHg MV Mean grad:  3.0 mmHg      SHUNTS MV Vmax:       1.58  m/s      Systemic VTI:  0.24 m MV Vmean:      71.8 cm/s     Systemic Diam: 1.70 cm MV Decel Time: 132 msec MR Peak grad:    112.8 mmHg MR Mean grad:    87.0 mmHg MR Vmax:         531.00  cm/s MR Vmean:        445.0 cm/s MR PISA:         1.90 cm MR PISA Eff ROA: 11 mm MR PISA Radius:  0.55 cm MV E velocity: 144.00 cm/s MV A velocity: 56.30 cm/s MV E/A ratio:  2.56 Mertie Moores MD Electronically signed by Mertie Moores MD Signature Date/Time: 04/16/2020/4:39:42 PM    Final     PHYSICAL EXAM General: Pleasant elderly Caucasian lady awake alert in no distress, mildly irritated, remarks that she feels hot and sweaty HEENT: Normocephalic atraumatic CV: Regular rate rhythm Extremities: warm well perfused Neurological exam She is awake alert oriented to self, place, time, and situation. No gross aphasia noted but has some repetitive questioning and perseverations. Patient's brother appeared similar suggesting this is a personality trait of patient at baseline. Cranial nerves: Pupils are equal round react light, extraocular movements appear unrestricted, no field cuts, face appears symmetric. Soft palate elevation symmetrical, shoulder shrug symmetric. Sensation intact to face bilaterally.  Motor exam: She is able to lift all 4 extremities antigravity without drift. Sensory exam: Intact to touch all over, Extinction is negative. Coordination: FTN intact  ASSESSMENT/PLAN Asti L Steger is a 80 y.o. female extensive past medical history of cerebrovascular risk factors including coronary artery disease status post CABG in 1997, carotid stenosis status post bilateral CEA, diabetes, hypertension, hyperlipidemia, and spondylithesis. who presented post fall. EMS noted that patient appeared to have slurred speech and was confused and activated CODE STROKE. Patient denies having slurred speech, but is able to talk about her fall in detail. Patient neurological exam is improved mostly due to patient improved cognitive status compared to patient on arrival. Interestingly patient was not to have hyperkalemia and hyperammonemia that was treated with kayexalate. It is possible that patient was also  suffering from metabolic encephalopathy and is improving due to hyperkalemia treatment. 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 Patient has been on ASA + Plavix and indicated that she took her medicine as prescribed. Patient's Plavix treatment has been ongoing for at least 6 years 2/2 cardiac disease, but patient has now met criteria for failure of medication. Will discontinue patient Plavix and start patient on ASA+ Brilinta for 4 weeks and then ASA alone. Due to patient's frequent falls will also consul EP for loop recorder placement for further evaluation for A fib. Patient has a Primary cardiologist. Unfortunately patient's frequent falls remain a concern and Brilinta increase her risk for hemorrhage with a fall; however will have PT evaluate patient and will continue cardiac workup as patient's stroke and falls may be cardiac related. Patient was noted to have what appear to be multiple punctate or subacute infracts in the L parietal and R occipital lobes again concerning for embolic etiology.  CT was concerning for a small remote L cerebellar infarct vs contusion as the patient indicated this area was where she hit her head.   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  Questionable right occipital and frontal infarcts also silent  Code Stroke  CT head No acute abnormality. Small remote cerebellar infarct. ASPECTS 10.   CTA head & neck: No emergent finding w/ no perfusion deficit. 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 Prior CEA bilateral CEA's noted  MRI  : L temporal cortex infarct  2D Echo: LVEF 50-55%, Moderate LVH, PAH: 73.72mHg, Mild-Mod TVR, Ascending Aortic dilatation noted, IVC dilated.   LDL 82  HgbA1c 6.9  VTE prophylaxis - SCDs    Diet   Diet Heart Room service appropriate? Yes; Fluid consistency: Thin   Start ASA + one time dose of Brilinta  1855mtoday and transition to ASA 8174m Brilinta 60m20mD tom.  Consult to EP for loop recorder placement  aspirin 81 mg daily and clopidogrel 75 mg daily prior to admission, now on aspirin 81 mg daily and Brilinta (ticagrelor) 90 mg bid.   Therapy recommendations:  PT/OT  Disposition:  Pending  Hypertension  Home meds:  norvasc 5mg,9msinopril 5mg, 48mtable . Permissive hypertension (OK if < 220/120) but gradually normalize in 5-7 days . Long-term BP goal normotensive  Hyperlipidemia  Home meds:  rosuvastain 5mg,  84m 82, goal < 70  Continue statin at discharge  Diabetes type II Controlled  Home meds:  Glipizide 10mg  H73mc 6.9, goal < 7.0  CBGs Recent Labs    04/16/20 2115 04/17/20 0605 04/17/20 1056  GLUCAP 243* 169* 189*      SSI  Other Stroke Risk Factors  Advanced Age >/= 65   Hx 16roke  Family hx stroke   Coronary artery disease   Hospital day # 1  Jai McQuDamita Dunnings-1 I have personally obtained history,examined this patient, reviewed notes, independently viewed imaging studies, participated in medical decision making and plan of care.ROS completed by me personally and pertinent positives fully documented  I have made any additions or clarifications directly to the above note. Agree with note above.  Patient presented with recurrent falls likely mechanical and unrelated and MRI scan shows left temporal cortical infarct and questionable to other right occipital and frontal punctate lesions.  The infarcts appear to be silent with strong suspicion for underlying atrial fibrillation.  Recommend loop recorder.  Long discussion with the patient and her brother over the phone and answered questions.  Recommend aspirin and Brilinta for 4 weeks followed by aspirin alone.  Aggressive risk factor modification.  Greater than 50% time during this 35-minute visit was spent on counseling and coordination of care about her silent strokes and discussion about plan  for evaluation and treatment and answering questions. Pramod SAntony Contrasical Director Moses [phone] 5:58 PM  To contact Stroke Continuity provider, please refer to [email or web address], contact General Neurology

## 2020-04-17 NOTE — TOC Initial Note (Addendum)
Transition of Care [facility]) - Initial/Assessment Note    Patient Details  Name: Andrea Kirk MRN: [id number] Date of Birth: 1940-08-22  Transition of Care [facility]) CM/SW Contact:    Marilu Favre, RN Phone Number: 04/17/2020, 10:39 AM  Clinical Narrative:                  Confirmed face sheet information with patient. Patient from [facility].   Discussed PT/OT recommendations for home health and supervision. Patient stated she is from ALF. NCM called [facility] , spoke with Elmyra Ricks. [facility] is all independent living and they do not provide intermittent supervision. Family would need to provide. Discussed with patient. Patient [place]. Patient called Juliene Pina is unavailable.   Patient called her brother Herbie Baltimore and left voice mail. Patient asking if she can go to SNF for short term rehab.  NCM messaged PT assigned to patient, will wait and see recommendations.   Patient has been to [facility] ) in the past.    Patient has a walker at home but does not have 3 in 1 and does NOT have home oxygen   1400 PT saw patient now recommending SNF. OT currently in room recommending SNF. Patient agreeable. States she has had covid vaccinations pfizer x 2.   NCM called patient's brother Dr Herbie Baltimore Visscher [phone] and left message. Dr Bergthold returned call and is in agreement with SNF.   Once NCM has offers will call Dr Mckeithan back and email offers to [email or web address]  Will start SNF work up and provide offers when received.  Expected Discharge Plan: [place] Barriers to Discharge: Continued Medical Work up   Patient Goals and CMS Choice Patient states their goals for this hospitalization and ongoing recovery are:: to return to home CMS Medicare.gov Compare Post Acute Care list provided to:: Patient Choice offered to / list presented to : Patient  Expected Discharge Plan and  Services Expected Discharge Plan: [place] Choice: [place] arrangements for the past 2 months: Apartment                                      Prior Living Arrangements/Services Living arrangements for the past 2 months: Apartment Lives with:: Self Patient language and need for interpreter reviewed:: Yes        Need for Family Participation in Patient Care: Yes (Comment) Care giver support system in place?: No (comment) Current home services: DME Criminal Activity/Legal Involvement Pertinent to Current Situation/Hospitalization: No - Comment as needed  Activities of Daily Living Home Assistive Devices/Equipment: Cane (specify quad or straight),Walker (specify type) ADL Screening (condition at time of admission) Is the patient deaf or have difficulty hearing?: Yes Does the patient have difficulty seeing, even when wearing glasses/contacts?: Yes Does the patient have difficulty concentrating, remembering, or making decisions?: Yes Does the patient have difficulty dressing or bathing?: Yes Does the patient have difficulty walking or climbing stairs?: Yes  Permission Sought/Granted   Permission granted to share information with : Yes, Verbal Permission Granted  Share Information with NAME: friend Wilder Glade patient left message for Herbie Baltimore           Emotional Assessment Appearance:: Appears stated age Attitude/Demeanor/Rapport: Engaged  Affect (typically observed): Accepting Orientation: : Oriented to Self,Oriented to Place,Oriented to  Time,Oriented to Situation Alcohol / Substance Use: Not Applicable    Admission diagnosis:  Fall [W19.XXXA] CVA (cerebral vascular accident) [facility]) [I63.9] Acute stroke due to ischemia [facility]) [I63.9] Patient Active Problem List   Diagnosis Date Noted  . CVA (cerebral vascular accident) ([place]) 04/16/2020  . Hyperkalemia 04/16/2020  . Follow-up  examination after eye surgery 04/22/2019  . 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 Depression 12/22/2017  . Chronic pain 12/22/2017  . 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 Falls 12/22/2017  . Chronic diastolic CHF (congestive heart failure) ([place]) 12/22/2017  . Hypoxia 12/21/2017  . ARF (acute renal failure) ([place]) 11/07/2016  . UTI (urinary tract infection) 11/07/2016  . Acute kidney injury ([place]) 08/05/2016  . AKI (acute kidney injury) ([place]) 08/04/2016  . Frequent falls 07/16/2016  . Hypertension 07/16/2016  . Asymptomatic stenosis of right carotid artery 03/22/2016  . Diabetes mellitus ([place]) 05/02/2015  . Carotid stenosis 11/16/2013  . Aftercare following surgery of the circulatory system, [place] 05/11/2013  . Carotid artery disease ([place]) 04/16/2013  . Coronary artery disease 03/11/2013  . Essential hypertension, benign 03/11/2013  . Hyperlipidemia 03/11/2013  . Occlusion and stenosis of carotid artery without mention of cerebral infarction 07/19/2011   PCP:  Aura Dials, MD Pharmacy:   [facility] [address], [place] [address] Phone: [phone] Fax: [phone]  [facility], [address], [address] [place], [place] [address] Phone: [phone] Fax: [employer], [place] [address] Phone: [phone] Fax: [phone]     Social Determinants of Health (SDOH) Interventions    Readmission Risk Interventions No flowsheet data found.

## 2020-04-17 NOTE — Consult Note (Addendum)
ELECTROPHYSIOLOGY CONSULT NOTE  Patient ID: Andrea Kirk MRN: [id number], DOB/AGE: 79/24/1942   Admit date: 04/16/2020 Date of Consult: 04/17/2020  Primary Physician: Aura Dials, MD Primary Cardiologist: Dr. Irish Lack Reason for Consultation: Cryptogenic stroke; recommendations regarding Implantable Loop Recorder, requested by Dr. Leonie Man  History of Present Illness Andrea Kirk was admitted on 04/16/2020 with recurrent fall, EMS called and she initially declined transport, after a second fall EMS was called, observed slurred speech  With perhaps some confusion.  Neurology consulted and found with stroke  PMHx includes: PVD s/p R CEA 92018), CAD (CABG 1997), DM, HTN, HLD, falls (described for her for some years)  Neurology notes:small acute infarct in the left temporal cortex and punctate acute to subacute infarct in the left parietal and right occipital cortex.  Suspect cardioembolic  .  she has undergone workup for stroke including echocardiogram and carotid dopplers.  The patient has been monitored on telemetry which has demonstrated sinus rhythm with no arrhythmias.  Neurology has deferred TEE   Echocardiogram this admission demonstrated   IMPRESSIONS  1. Left ventricular ejection fraction, by estimation, is 50 to 55%. Left  ventricular ejection fraction by 3D volume is 51 %. The left ventricle has  low normal function. The left ventricle has no regional wall motion  abnormalities. There is moderate left  ventricular hypertrophy. Left ventricular diastolic parameters are  consistent with Grade II diastolic dysfunction (pseudonormalization).  Elevated left ventricular end-diastolic pressure.  2. Right ventricular systolic function is mildly reduced. The right  ventricular size is mildly enlarged. There is severely elevated pulmonary  artery systolic pressure. The estimated right ventricular systolic  pressure is 29.9 mmHg.  3. The mitral valve is grossly normal. Moderate  mitral valve  regurgitation.  4. Tricuspid valve regurgitation is mild to moderate.  5. The aortic valve is grossly normal. Aortic valve regurgitation is not  visualized. No aortic stenosis is present.  6. Aortic dilatation noted. There is moderate dilatation of the ascending  aorta, measuring 42 mm.  7. The inferior vena cava is dilated in size with <50% respiratory  variability, suggesting right atrial pressure of 15 mmHg.   (2019 PA 13mmHg) (2018 PAP was 5mmHg)   Lab work is reviewed. K+ 5.2   Prior to admission, the patient denies chest pain, shortness of breath, dizziness, syncope. She has a rare fleeting flutter in her chest They are recovering from their stroke with plans to home at discharge.      Past Medical History:  Diagnosis Date  . Acute kidney injury ([place]) 08/05/2016  . Aftercare following surgery of the circulatory system, [place] 05/11/2013  . AKI (acute kidney injury) ([place]) 08/04/2016  . Anxiety    takes Celexa daily  . ARF (acute renal failure) ([place]) 11/07/2016  . Asymptomatic stenosis of right carotid artery 03/22/2016  . Back pain    occasionally  . Carotid artery disease ([place]) 04/16/2013  . Carotid artery occlusion   . Carotid stenosis 11/16/2013  . Cataract    left and immature  . Coronary artery disease   . Coronary atherosclerosis of native coronary artery 03/11/2013   S/p CABG in 1997   . Depression   . Diabetes mellitus    takes Metformin and Glipizide daily  . Diabetes mellitus ([place]) 05/02/2015  . Dizziness    takes Meclizine daily as needed  . Essential hypertension, benign 03/11/2013  . GERD (gastroesophageal reflux disease)    takes Omeprazole daily as needed  . Headache(784.0)   .  Hyperlipidemia    takes Atorvastatin daily  . Hypertension    takes Carvedilol daily  . Mixed hyperlipidemia 03/11/2013  . Muscle spasm    takes Robaxin daily as needed  . Nausea    takes Phenergan daily as needed  . Occlusion and stenosis of carotid artery  without mention of cerebral infarction 07/19/2011  . Pneumonia    hx of-in high school  . Restless leg    takes Requip daily as needed  . Seasonal allergies    takes Claritin daily as needed and Afrin as needed  . Shortness of breath    with exertion  . Urinary urgency   . UTI (urinary tract infection) 11/07/2016     Surgical History:  Past Surgical History:  Procedure Laterality Date  . COLONOSCOPY WITH PROPOFOL N/A 03/10/2012   Procedure: COLONOSCOPY WITH PROPOFOL;  Surgeon: Garlan Fair, MD;  Location: [facility];  Service: Endoscopy;  Laterality: N/A;  . CORNEAL TRANSPLANT Right   . CORONARY ARTERY BYPASS GRAFT  1997   x 6  . CORONARY ARTERY BYPASS GRAFT  Jan. 1997  . ENDARTERECTOMY Left 04/16/2013   Procedure: Left Carotid Artery Endatarectomy with Resection of Redundant Internal Carotid Artery;  Surgeon: Rosetta Posner, MD;  Location: [facility];  Service: Vascular;  Laterality: Left;  . ENDARTERECTOMY Right 03/22/2016   Procedure: RIGHT ENDARTERECTOMY CAROTID;  Surgeon: Rosetta Posner, MD;  Location: [facility] OR;  Service: Vascular;  Laterality: Right;  . ESOPHAGOGASTRODUODENOSCOPY N/A 03/10/2012   Procedure: ESOPHAGOGASTRODUODENOSCOPY (EGD);  Surgeon: Garlan Fair, MD;  Location: [facility];  Service: Endoscopy;  Laterality: N/A;  . EYE SURGERY  March 12, 2001   CORNEA TRANSPLANT Right eye  . PATCH ANGIOPLASTY Right 03/22/2016   Procedure: PATCH ANGIOPLASTY;  Surgeon: Rosetta Posner, MD;  Location: [facility];  Service: Vascular;  Laterality: Right;  . PR VEIN BYPASS GRAFT,AORTO-FEM-POP  1997  . SPINE SURGERY  march 2013   Back surgery  . TONSILLECTOMY    . TRIGGER FINGER RELEASE Left    thumb     Medications Prior to Admission  Medication Sig Dispense Refill Last Dose  . acetaminophen (TYLENOL) 325 MG tablet Take 650 mg by mouth every 6 (six) hours as needed for moderate pain.   unk  . amLODipine (NORVASC) 5 MG tablet Take 1 tablet (5 mg total) by mouth daily. 30 tablet 0 04/15/2020  .  Ascorbic Acid (VITAMIN C) 1000 MG tablet Take 1,000 mg by mouth daily.   04/15/2020  . aspirin EC 81 MG tablet Take 81 mg by mouth every evening.    04/15/2020  . Calcium Carbonate-Vitamin D (CALTRATE 600+D PO) Take 1 tablet by mouth daily.   04/15/2020  . carvedilol (COREG) 25 MG tablet TAKE ONE TABLET BY MOUTH TWICE A DAY (Patient taking differently: Take 25 mg by mouth 2 (two) times daily with a meal.) 180 tablet 3 04/15/2020 at 1900  . cholecalciferol (VITAMIN D3) 25 MCG (1000 UT) tablet Take 1,000 Units by mouth daily.   04/15/2020  . citalopram (CELEXA) 20 MG tablet Take 20 mg by mouth every morning.    04/15/2020  . clopidogrel (PLAVIX) 75 MG tablet Take 75 mg by mouth every morning.    04/15/2020  . Cyanocobalamin (B-12) 2500 MCG TABS Take 2,500 mcg by mouth daily.   04/15/2020  . FERROUS SULFATE PO Take 325 mg by mouth daily.   04/15/2020  . fish oil-omega-3 fatty acids 1000 MG capsule Take 1 g by mouth  daily.   04/15/2020  . furosemide (LASIX) 20 MG tablet Take 10 mg by mouth every other day.   04/15/2020  . gabapentin (NEURONTIN) 300 MG capsule Take 300 mg by mouth 4 (four) times daily.    04/15/2020  . glipiZIDE (GLUCOTROL XL) 10 MG 24 hr tablet Take 10 mg by mouth daily with breakfast.    04/15/2020  . HYDROcodone-acetaminophen (NORCO) 10-325 MG tablet Take 1 tablet by mouth 2 (two) times daily as needed for severe pain.    04/14/2020  . lisinopril (ZESTRIL) 5 MG tablet Take 5 mg by mouth daily.   04/15/2020  . Magnesium Oxide 250 MG TABS Take 250 mg by mouth daily.   04/15/2020  . meclizine (ANTIVERT) 25 MG tablet Take 25 mg by mouth 2 (two) times daily as needed for dizziness. For dizziness   unk  . nitroGLYCERIN (NITROSTAT) 0.4 MG SL tablet Place 1 tablet (0.4 mg total) under the tongue every 5 (five) minutes as needed. Chest pain. 25 tablet 5 unk  . pantoprazole (PROTONIX) 40 MG tablet Take 40 mg by mouth daily as needed (INDIGESTION).    04/15/2020  . promethazine (PHENERGAN) 12.5 MG tablet Take 12.5 mg by mouth  every 6 (six) hours as needed for nausea or vomiting.   unk  . rosuvastatin (CRESTOR) 5 MG tablet Take 5 mg by mouth daily. Takes on Monday and Friday   04/14/2020  . amoxicillin (AMOXIL) 500 MG capsule Take 1,000 mg by mouth 2 (two) times daily.     . methocarbamol (ROBAXIN) 500 MG tablet Take 1 tablet (500 mg total) by mouth every 8 (eight) hours as needed for muscle spasms. (Patient not taking: Reported on 04/17/2020) 30 tablet 0 Not Taking at Unknown time    Inpatient Medications:  . aspirin  300 mg Rectal Daily   Or  . aspirin  325 mg Oral Daily  . insulin aspart  0-6 Units Subcutaneous TID WC    Allergies:  Allergies  Allergen Reactions  . Codeine Other (See Comments)    Abnormal behavior Other reaction(s): Unknown  . Latex Other (See Comments)    tears skin Other reaction(s): Rash  . Morphine And Related Other (See Comments)    Affects BP and blood sugar.  . Cyclobenzaprine     MADE PT SICK- pt unsure if med was cyclobenzaprine or methocarbamol   . Methocarbamol Other (See Comments)    MADE PT SICK- pt unsure if med was cyclobenzaprine or methocarbamol   . Morphine     Other reaction(s): Unknown    Social History   Socioeconomic History  . Marital status: Widowed    Spouse name: Not on file  . Number of children: Not on file  . Years of education: Not on file  . Highest education level: Not on file  Occupational History  . Not on file  Tobacco Use  . Smoking status: Never Smoker  . Smokeless tobacco: Never Used  Vaping Use  . Vaping Use: Never used  Substance and Sexual Activity  . Alcohol use: No    Alcohol/week: 0.0 standard drinks  . Drug use: No  . Sexual activity: Never    Birth control/protection: Post-menopausal  Other Topics Concern  . Not on file  Social History Narrative  . Not on file   Social Determinants of Health   Financial Resource Strain: Not on file  Food Insecurity: Not on file  Transportation Needs: Not on file  Physical Activity:  Not on file  Stress:  Not on file  Social Connections: Not on file  Intimate Partner Violence: Not on file     Family History  Problem Relation Age of Onset  . Heart attack Father   . Heart disease Father 85       Before age of 13  . Hyperlipidemia Father   . Heart disease Brother        Heart dissease before age 34  . Hyperlipidemia Brother   . Cirrhosis Mother   . Heart attack Paternal Grandfather   . Heart disease Paternal Grandfather   . Cancer Maternal Grandmother   . Alcoholism Maternal Grandfather   . Heart attack Paternal Grandmother       Review of Systems: All other systems reviewed and are otherwise negative except as noted above.  Physical Exam: Vitals:   04/17/20 0025 04/17/20 0520 04/17/20 0838 04/17/20 1143  BP: (!) 158/74 (!) 178/85 (!) 144/87 130/75  Pulse: 78 78 89 75  Resp: 18 18 18 18   Temp: 97.9 F (36.6 C) 97.8 F (36.6 C) 98.3 F (36.8 C) 98.4 F (36.9 C)  TempSrc: Oral Oral Oral Oral  SpO2: 99% 98% 98% 99%    GEN- The patient is well appearing, alert and oriented x 3 today.   Head- normocephalic, atraumatic Eyes-  Sclera clear, conjunctiva pink Ears- hearing intact Oropharynx- clear Neck- supple Lungs- CTA b/l, normal work of breathing Heart- RRR, no murmurs, rubs or gallops  GI- soft, NT, ND Extremities- no clubbing, cyanosis, or edema MS- no significant deformity or atrophy Skin- no rash or lesion Psych- euthymic mood, full affect   Labs:   Lab Results  Component Value Date   WBC 6.7 04/16/2020   HGB 12.6 04/16/2020   HCT 37.0 04/16/2020   MCV 123.1 (H) 04/16/2020   PLT 165 04/16/2020    Recent Labs  Lab 04/16/20 0802 04/16/20 0806  NA 138 139  K 5.3* 5.2*  CL 101 101  CO2 28  --   BUN 38* 40*  CREATININE 1.42* 1.30*  CALCIUM 9.8  --   PROT 6.7  --   BILITOT 1.2  --   ALKPHOS 49  --   ALT 72*  --   AST 73*  --   GLUCOSE 230* 210*   Lab Results  Component Value Date   TROPONINI 0.52 (HH) 08/05/2016   Lab  Results  Component Value Date   CHOL 131 04/17/2020   Lab Results  Component Value Date   HDL 19 (L) 04/17/2020   Lab Results  Component Value Date   LDLCALC 82 04/17/2020   Lab Results  Component Value Date   TRIG 149 04/17/2020   Lab Results  Component Value Date   CHOLHDL 6.9 04/17/2020   No results found for: LDLDIRECT  Lab Results  Component Value Date   DDIMER 0.94 (H) 12/21/2017     Radiology/Studies:  CT ANGIO HEAD W OR WO CONTRAST Result Date: 04/16/2020 CLINICAL DATA:  Stroke suspected EXAM: CT ANGIOGRAPHY HEAD AND NECK CT PERFUSION BRAIN TECHNIQUE: Multidetector CT imaging of the head and neck was performed using the standard protocol during bolus administration of intravenous contrast. Multiplanar CT image reconstructions and MIPs were obtained to evaluate the vascular anatomy. Carotid stenosis measurements (when applicable) are obtained utilizing NASCET criteria, using the distal internal carotid diameter as the denominator. Multiphase CT imaging of the brain was performed following IV bolus contrast injection. Subsequent parametric perfusion maps were calculated using RAPID software. CONTRAST:  Dose is currently not  known COMPARISON:  Noncontrast head CT from earlier today FINDINGS: CTA NECK FINDINGS Aortic arch: Atheromatous plaque.  Three vessel branching. Right carotid system: Partial retropharyngeal course. Suspect carotid endarterectomy. No stenosis or ulceration. Left carotid system: Partial retropharyngeal course of the ICAs. Suspect endarterectomy. No focal or flow limiting stenosis. Vertebral arteries: Proximal left subclavian atherosclerosis with stenosis exacerbated by kink. Luminal narrowing is 80% as measured on coronal reformats. Calcified plaque at the bilateral vertebral origin with high-grade stenoses. No dissection. Skeleton: No acute finding Other neck: Postoperative changes in the bilateral neck. No acute finding. Upper chest: No acute finding Review of  the MIP images confirms the above findings CTA HEAD FINDINGS Anterior circulation: Heavily calcified bilateral supraclinoid ICA which limits luminal visualization due to calcified plaque blooming. No flow limiting stenosis by measurement. Overall mild atheromatous irregularity of branches without branch occlusion or proximal focal stenosis. Negative for aneurysm Posterior circulation: Extensive atheromatous plaque to the bilateral V4 segments with high-grade narrowing on the left, pre occlusive. The basilar is smooth and widely patent. High-grade right P1 segment stenosis. No PCA branch occlusion or asymmetric flow. Venous sinuses: Unremarkable for contrast time Anatomic variants: None significant Review of the MIP images confirms the above findings CT Brain Perfusion Findings: ASPECTS: 10 CBF (<30%) Volume: 45mL Perfusion (Tmax>6.0s) volume: 29mL IMPRESSION: 1. No emergent finding.  No perfusion deficit. 2. 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 High-grade bilateral vertebral origin stenosis. High-grade left V4 segment stenosis. 3. Carotid endarterectomy on both sides. No proximal flow limiting stenosis in the anterior circulation. 4. High-grade right P1 segment stenosis. Electronically Signed   By: Monte Fantasia M.D.   On: 04/16/2020 08:36    MR BRAIN WO CONTRAST Result Date: 04/16/2020 CLINICAL DATA:  Fall this morning with head injury EXAM: MRI HEAD WITHOUT CONTRAST TECHNIQUE: Multiplanar, multiecho pulse sequences of the brain and surrounding structures were obtained without intravenous contrast. COMPARISON:  Head CT and CTA from earlier today FINDINGS: Brain: Subcentimeter acute infarct in the lateral left temporal cortex. Punctate acute or subacute infarct along the left parietal cortex. On coronal diffusion there is an acute or subacute infarct in the right occipital white matter. No evidence of hemorrhage, hydrocephalus, collection, or mass. Only diffusion and sagittal T1  weighted images were acquired due to claustrophobia. T1 weighted imaging is motion degraded Vascular: No findings on the above sequences Skull and upper cervical spine: No evidence of focal lesion. Posterior scalp swelling. IMPRESSION: 1. Small acute infarct in the left temporal cortex. Punctate acute or subacute infarcts in the left parietal and right occipital cortex. 2. Truncated study due to claustrophobia. Electronically Signed   By: Monte Fantasia M.D.   On: 04/16/2020 09:50    CT C-SPINE NO CHARGE Result Date: 04/16/2020 CLINICAL DATA:  Fall. EXAM: CT CERVICAL SPINE WITHOUT CONTRAST TECHNIQUE: Multidetector CT imaging of the cervical spine was performed without intravenous contrast. Multiplanar CT image reconstructions were also generated. COMPARISON:  None. FINDINGS: Alignment: Straightening of normal cervical lordosis without subluxation. Skull base and vertebrae: No acute fracture. No primary bone lesion or focal pathologic process. Soft tissues and spinal canal: No prevertebral fluid or swelling. No visible canal hematoma. Disc levels: Loss of disc height noted at all levels from C3-4 to C7-T1 with associated endplate degeneration. Facets are well aligned bilaterally. Upper chest: Unremarkable. Other: None. IMPRESSION: 1. Degenerative changes in the cervical spine without evidence for an acute fracture. 2. Loss of cervical lordosis. This can be related to patient positioning, muscle  spasm or soft tissue injury. Electronically Signed   By: Misty Stanley M.D.   On: 04/16/2020 09:13    DG Chest Port 1 View Result Date: 04/16/2020 CLINICAL DATA:  Encephalopathy with fall EXAM: PORTABLE CHEST 1 VIEW COMPARISON:  Jun 07, 2019 FINDINGS: There is no edema or airspace opacity. There is cardiomegaly with pulmonary vascularity within normal limits. The patient is status post coronary artery bypass grafting. No adenopathy evident. There is aortic atherosclerosis. Bones appear osteoporotic. IMPRESSION: No  edema or airspace opacity. Cardiomegaly with postoperative changes. Aortic Atherosclerosis (ICD10-I70.0). Osteoporosis. Electronically Signed   By: Lowella Grip III M.D.   On: 04/16/2020 10:29   DG Foot Complete Left Result Date: 04/16/2020 CLINICAL DATA:  Pain following fall EXAM: LEFT FOOT - COMPLETE 3+ VIEW COMPARISON:  September 07, 2015 FINDINGS: Frontal, oblique, and lateral views were obtained. Bones are osteoporotic. No acute fracture or dislocation. Stable bony overgrowth along the lateral aspect of the proximal first metatarsal likely residua of prior trauma. There is hallux valgus deformity at the first MTP joint. There is mild narrowing of all PIP and D IP joints. No erosive changes. There is a small inferior calcaneal spur. IMPRESSION: Osteoporosis. No acute fracture or dislocation evident. Suspect prior trauma with bony remodeling along the lateral proximal aspect first metatarsal, stable. Hallux valgus deformity first MTP joint. Narrowing multiple distal joints. Small inferior calcaneal spur. Electronically Signed   By: Lowella Grip III M.D.   On: 04/16/2020 10:31     CT HEAD CODE STROKE WO CONTRAST Result Date: 04/16/2020 CLINICAL DATA:  Code stroke.  Slurred speech EXAM: CT HEAD WITHOUT CONTRAST TECHNIQUE: Contiguous axial images were obtained from the base of the skull through the vertex without intravenous contrast. COMPARISON:  06/07/2019 FINDINGS: Brain: No evidence of acute infarction, hemorrhage, hydrocephalus, extra-axial collection or mass lesion/mass effect. Small remote left cerebellar infarct. Vascular: No hyperdense vessel or unexpected calcification. Skull: Normal. Negative for fracture or focal lesion. Sinuses/Orbits: No acute finding.  Bilateral cataract resection Other: These results were communicated to Dr. Rory Percy at 8:17 amon 4/10/2022by text page via the [facility] messaging system. ASPECTS [facility] Stroke Program Early CT Score) - Ganglionic level infarction (caudate,  lentiform nuclei, internal capsule, insula, M1-M3 cortex): 7 - Supraganglionic infarction (M4-M6 cortex): 3 Total score (0-10 with 10 being normal): 10 IMPRESSION: 1. No acute finding. 2. Small remote left cerebellar infarct. Electronically Signed   By: Monte Fantasia M.D.   On: 04/16/2020 08:18    12-lead ECG SR All prior EKG's in EPIC reviewed with no documented atrial fibrillation  Telemetry SR  Assessment and Plan:  1. Cryptogenic stroke The patient presents with cryptogenic stroke.    I spoke at length with the patient about monitoring for afib with either a 30 day event monitor or an implantable loop recorder.  Risks, benefits, and alteratives to implantable loop recorder were discussed with the patient today.   At this time,she would like some time to think abut it, perhaps tomorrow or even out patient.  She is not ready to proceed today.   2. Severe p.HTN on her echo     Deferred to primary team, has been elevated in the past     Recommend Dr. Irish Lack follow up  Baldwin Jamaica, PA-C 04/17/2020

## 2020-04-17 NOTE — [place] FL2 (Signed)
[place] MEDICAID FL2 LEVEL OF CARE SCREENING TOOL     IDENTIFICATION  Patient Name: Andrea Kirk Birthdate: [birth year] Sex: female Admission Date (Current Location): 04/16/2020  [facility] and [place] Number:  Herbalist and Address:  [facility] [place] [address] [place], [place] [address]      Provider Number: [id number]  Attending Physician Name and Address:  Tawni Millers  Relative Name and Phone Number:  Dr Herbie [place] [phone]    Current Level of Care: Hospital Recommended Level of Care: [place] Prior Approval Number:    Date Approved/Denied:   PASRR Number: [id number]  Discharge Plan: SNF    Current Diagnoses: Patient Active Problem List   Diagnosis Date Noted  . CVA (cerebral vascular accident) ([place]) 04/16/2020  . Hyperkalemia 04/16/2020  . Follow-up examination after eye surgery 04/22/2019  . 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 Depression 12/22/2017  . Chronic pain 12/22/2017  . 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 Falls 12/22/2017  . Chronic diastolic CHF (congestive heart failure) ([place]) 12/22/2017  . Hypoxia 12/21/2017  . ARF (acute renal failure) ([place]) 11/07/2016  . UTI (urinary tract infection) 11/07/2016  . Acute kidney injury ([place]) 08/05/2016  . AKI (acute kidney injury) ([place]) 08/04/2016  . Frequent falls 07/16/2016  . Hypertension 07/16/2016  . Asymptomatic stenosis of right carotid artery 03/22/2016  . Diabetes mellitus ([place]) 05/02/2015  . Carotid stenosis 11/16/2013  . Aftercare following surgery of the circulatory system, [place] 05/11/2013  . Carotid artery disease ([place]) 04/16/2013  . Coronary artery disease 03/11/2013  . Essential hypertension, benign 03/11/2013  . Hyperlipidemia 03/11/2013  . Occlusion and stenosis of carotid artery without mention of cerebral infarction 07/19/2011    Orientation  RESPIRATION BLADDER Height & Weight     Self,Time,Situation,Place  O2 (2L) Continent Weight:   Height:     BEHAVIORAL SYMPTOMS/MOOD NEUROLOGICAL BOWEL NUTRITION STATUS      Continent Diet  AMBULATORY STATUS COMMUNICATION OF NEEDS Skin   Limited Assist Verbally Normal                       Personal Care Assistance Level of Assistance  Bathing,Feeding,Dressing Bathing Assistance: Limited assistance Feeding assistance: Limited assistance Dressing Assistance: Limited assistance     Functional Limitations Info  Sight,Hearing Sight Info: Adequate Hearing Info: Adequate      SPECIAL CARE FACTORS FREQUENCY  PT (By licensed PT),OT (By licensed OT)     PT Frequency: 5 times a week OT Frequency: 5 times a week            Contractures Contractures Info: Not present    Additional Factors Info  Code Status,Allergies,Insulin Sliding Scale Code Status Info: full Allergies Info: codeine, morphine, latex, cyclobenzaprine, methocarbamol, morphine and related   Insulin Sliding Scale Info: Novolog 0 to 6 units TID with meals       Current Medications (04/17/2020):  This is the current hospital active medication list Current Facility-Administered Medications  Medication Dose Route Frequency Provider Last Rate Last Admin  . acetaminophen (TYLENOL) tablet 650 mg  650 mg Oral Q4H PRN Phillips Grout, MD   650 mg at 04/16/20 2034   Or  . acetaminophen (TYLENOL) 160 MG/5ML solution 650 mg  650 mg Per Tube Q4H PRN Phillips Grout, MD       Or  . acetaminophen (TYLENOL) suppository 650 mg  650 mg Rectal  Q4H PRN Phillips Grout, MD      . Derrill Memo ON 04/18/2020] aspirin chewable tablet 81 mg  81 mg Oral Daily Damita Dunnings B, MD      . HYDROcodone-acetaminophen (NORCO) 10-325 MG per tablet 1 tablet  1 tablet Oral BID PRN Phillips Grout, MD   1 tablet at 04/17/20 0844  . insulin aspart (novoLOG) injection 0-6 Units  0-6 Units Subcutaneous TID WC Phillips Grout, MD   1 Units at 04/17/20  1119  . ondansetron (ZOFRAN) injection 4 mg  4 mg Intravenous Q6H PRN Arrien, Jimmy Picket, MD   4 mg at 04/17/20 0844  . ticagrelor (BRILINTA) tablet 180 mg  180 mg Oral Once Freida Busman, MD      . Derrill Memo ON 04/18/2020] ticagrelor (BRILINTA) tablet 90 mg  90 mg Oral BID Freida Busman, MD         Discharge Medications: Please see discharge summary for a list of discharge medications.  Relevant Imaging Results:  Relevant Lab Results:   Additional Information SS [id number], DX CVA , oxygen 2 l [place]  Philisha Weinel, Edson Snowball, RN

## 2020-04-17 NOTE — Progress Notes (Signed)
Inpatient Diabetes Program Recommendations  AACE/ADA: New Consensus Statement on Inpatient Glycemic Control (2015)  Target Ranges:  Prepandial:   less than 140 mg/dL      Peak postprandial:   less than 180 mg/dL (1-2 hours)      Critically ill patients:  140 - 180 mg/dL   Lab Results  Component Value Date   GLUCAP 169 (H) 04/17/2020   HGBA1C 6.9 (H) 04/17/2020    Review of Glycemic Control Results for Andrea Kirk, Andrea Kirk (MRN [id number]) as of 04/17/2020 08:33  Ref. Range 04/16/2020 11:19 04/16/2020 14:54 04/16/2020 21:15 04/17/2020 06:05  Glucose-Capillary Latest Ref Range: 70 - 99 mg/dL 205 (H) 248 (H) 243 (H) 169 (H)   Diabetes history: DM 2 Outpatient Diabetes medications: Glipizide 10 gm Daily Current orders for Inpatient glycemic control: Novolog 0-6 units tid  A1c 6.9% on 4/11  Inpatient Diabetes Program Recommendations:    -  Add Novolog hs scale  Thanks,  Tama Headings RN, MSN, BC-ADM Inpatient Diabetes Coordinator Team Pager [phone] (8a-5p)

## 2020-04-17 NOTE — Progress Notes (Signed)
Physical Therapy Treatment Patient Details Name: Andrea Kirk MRN: [id number] DOB: [birth year] Today's Date: 04/17/2020    History of Present Illness Pt is a 80 y.o. F who presents after a fall with AMS. 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 Significant PMH: CAD, DM, HTN.    PT Comments    Pt received in bed, reporting she is tired and nausea but willing to participate. Able to progress gait distance. Cueing needed for safe navigation with RW as pt was attempting to use the handrails in the hallways with one hand as well as to maintain RW at appropriate distance during stand to sit. Pt difficulty with problem-solving and sequencing. Follows commands consistently but with increased time. Do not feel pt is safe to return home alone. Pt would benefit from PT to improve balance, decrease risk for falls, and increase independency. Recommend SNF and pt agreeable to plan. Will continue to follow acutely.    Follow Up Recommendations  SNF;Supervision for mobility/OOB     Equipment Recommendations  None recommended by PT    Recommendations for Other Services       Precautions / Restrictions Precautions Precautions: Fall;Other (comment) Precaution Comments: monitor O2 Restrictions Weight Bearing Restrictions: No    Mobility  Bed Mobility Overal bed mobility: Needs Assistance Bed Mobility: Supine to Sit     Supine to sit: HOB elevated;Supervision     General bed mobility comments: Supervision with increased time.    Transfers Overall transfer level: Needs assistance Equipment used: Rolling walker (2 wheeled) Transfers: Sit to/from Stand Sit to Stand: Mod assist;Min assist         General transfer comment: Needed mod Ax1 to come into standing for EOB, min A for BSC  Ambulation/Gait Ambulation/Gait assistance: Min guard Gait Distance (Feet): 110 Feet (90+10+10) Assistive  device: Rolling walker (2 wheeled) Gait Pattern/deviations: Step-through pattern;Decreased dorsiflexion - right;Decreased step length - right;Trunk flexed Gait velocity: decreased   General Gait Details: Min guard for safety, no overt LOB, cueing for safe navigation with RW   Stairs             Wheelchair Mobility    Modified Rankin (Stroke Patients Only)       Balance Overall balance assessment: Needs assistance Sitting-balance support: Feet supported Sitting balance-Leahy Scale: Good     Standing balance support: Bilateral upper extremity supported Standing balance-Leahy Scale: Poor Standing balance comment: okay static standing for washing hands at sink, reliant on B UE support for mobility                            Cognition Arousal/Alertness: Awake/alert Behavior During Therapy: WFL for tasks assessed/performed Overall Cognitive Status: No family/caregiver present to determine baseline cognitive functioning                                 General Comments: Pt with some decreased awareness of capabilities/deficits. Difficulty problem solving. Needed increased time for processing      Exercises      General Comments General comments (skin integrity, edema, etc.): Pt received on 2L O2, denies wearing O2 at baseline. Trial RA, but pt dropping to 85% seated in bed during conversation. Returned to 2L O2, SPO2 >95% for rest of session. Left on 2L.      Pertinent Vitals/Pain Pain Assessment:  Faces Faces Pain Scale: Hurts even more Pain Location: L neck, stomach Pain Descriptors / Indicators: Discomfort;Aching;Sore Pain Intervention(s): Monitored during session;Repositioned;Patient requesting pain meds-RN notified    Home Living                      Prior Function            PT Goals (current goals can now be found in the care plan section) Acute Rehab PT Goals Patient Stated Goal: get rid of headache    Frequency     Min 4X/week      PT Plan Discharge plan needs to be updated    Co-evaluation              AM-PAC PT "6 Clicks" Mobility   Outcome Measure  Help needed turning from your back to your side while in a flat bed without using bedrails?: A Little Help needed moving from lying on your back to sitting on the side of a flat bed without using bedrails?: A Little Help needed moving to and from a bed to a chair (including a wheelchair)?: A Little Help needed standing up from a chair using your arms (e.g., wheelchair or bedside chair)?: A Lot Help needed to walk in hospital room?: A Little Help needed climbing 3-5 steps with a railing? : A Little 6 Click Score: 17    End of Session Equipment Utilized During Treatment: Gait belt Activity Tolerance: Patient tolerated treatment well Patient left: with call bell/phone within reach;in chair Nurse Communication: Mobility status PT Visit Diagnosis: Unsteadiness on feet (R26.81);Difficulty in walking, not elsewhere classified (R26.2)     Time:  -     Charges:                        Rosita Kea, SPT

## 2020-04-17 NOTE — Progress Notes (Signed)
PROGRESS NOTE    Andrea Kirk  JHE:[id number] DOB: 28-Jun-1940 DOA: 04/16/2020 PCP: Aura Dials, MD    Brief Narrative:  Andrea Kirk was admitted to the hospital with a working diagnosis of acute ischemic CVA at the left temporal cortex and punctate acute or subacute infarcts in the left parietal and right occipital cortex/ cardioembolic.  80 year old female past medical history for carotid artery disease, coronary artery disease, type II T[place] mellitus and dyslipidemia who presents with altered mentation and mechanical falls.  She lives in assisted living, she was found down, confused and weak.  She was transported to the hospital for further evaluation.  On her initial physical examination blood pressure 156/75, heart rate 86, respiratory rate 15, temperature 98.9, oxygen saturation 92%.  Lungs are clear to auscultation bilaterally, heart S1-S2, present rhythmic, soft abdomen, lower extremity edema.  Her strength was preserved.  Sodium 138, potassium 5.3, chloride 101, bicarb 28, BUN 38, creatinine 1.42, white cell count 6.7, hemoglobin 11.8, hematocrit 36.8, platelets 165.  SARS COVID-19 negative. Urinalysis specific gravity 1.043.  Head CT no acute findings, small remote left cerebellar infarct. Brain MRI with small acute infarct in the left temporal cortex, punctate acute or subacute infarcts in the left parietal and right occipital cortex.  Chest radiograph with cardiomegaly. EKG 87 bpm, normal axis, normal intervals, sinus rhythm, Q-wave lead I-aVL, Q waves lead II, no significant ST segment or T wave changes.    Assessment & Plan:   Principal Problem:   CVA (cerebral vascular accident) [facility]) Active Problems:   Essential hypertension, benign   Diabetes mellitus ([place])   Chronic diastolic CHF (congestive heart failure) ([facility])   Frequent falls   Hyperkalemia   1. 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  Speech  continue to be slurred not yet back to baseline.   CT angiography with 80% atheromatous narrowing at the proximal left subclavian artery. High grade bilateral vertebral origin stenosis, high grade V 4 segment stenosis.  High grade right P1 stenosis.   Echocardiogram with preserved LV systolic function with EF 50 to 55%. Moderate LVH, mild reduction in RV systolic function, mild cavity enlargement, severe elevation of pulmonary artery systolic pressure 18.5 mmHg. Positive TR mild to moderate.  Moderate dilatation of ascending aorta.   Continue with dual antiplatelet therapy with asa and ticagrelor and high dose statin, continue to follow up neurology, PT and OT recommendations.   Follow up with EP for loop recorder implantation to rule out occult A fib.   2. HTN. Continue blood pressure control with amlodipine, and carvedilol, continue holding on lisinopril due to AKI  3. T2DM with dyslipidemia. Continue glucose cover and monitoring with sliding scale  Patient is tolerating po well.   4. 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 No signs of acute exacerbation, will continue blood pressure control, hold on diuretic therapy for now.   5. AKI on CKD 2./ hyperkalemia. K continue to be elevated at 5,2 with serum cr at 1,30 and bicarbonate 28.  Add sodium zirconium x 2 doses and add gentle IV fluids, will hold on diuretic therapy and ace inh for now. Follow up on renal function in am,  6. Depression. Continue with citalopram.   7. Iron deficiency anemia. Continue with iron supplementation.   Patient continue to be at high risk for worsening CVA   Status is: Inpatient  Remains inpatient appropriate because:Inpatient level of care appropriate due to severity of illness  Dispo: The patient is from: Home              Anticipated d/c is to: Home              Patient currently is not medically stable to d/c.   Difficult to place patient No   DVT prophylaxis: Enoxaparin    Code Status:   Full Family Communication:  No family at the bedside      Consultants:   Neurology   EP     Subjective: Patient continue to have slurred speech improved but not yet back to baseline, no nausea or vomiting, no chest pain or dyspnea,   Objective: Vitals:   04/17/20 0025 04/17/20 0520 04/17/20 0838 04/17/20 1143  BP: (!) 158/74 (!) 178/85 (!) 144/87 130/75  Pulse: 78 78 89 75  Resp: 18 18 18 18   Temp: 97.9 F (36.6 C) 97.8 F (36.6 C) 98.3 F (36.8 C) 98.4 F (36.9 C)  TempSrc: Oral Oral Oral Oral  SpO2: 99% 98% 98% 99%    Intake/Output Summary (Last 24 hours) at 04/17/2020 1409 Last data filed at 04/17/2020 0528 Gross per 24 hour  Intake --  Output 800 ml  Net -800 ml   There were no vitals filed for this visit.  Examination:   General: Not in pain or dyspnea, deconditioned  Neurology: Awake and alert, non focal  E ENT: positive pallor, no icterus, oral mucosa moist Cardiovascular: No JVD. S1-S2 present, rhythmic, no gallops, rubs, or murmurs. No lower extremity edema. Pulmonary: positive breath sounds bilaterally, with no wheezing, rhonchi or rales. Gastrointestinal. Abdomen soft and non tender Skin. No rashes Musculoskeletal: no joint deformities     Data Reviewed: I have personally reviewed following labs and imaging studies  CBC: Recent Labs  Lab 04/16/20 0802 04/16/20 0806  WBC 6.7  --   NEUTROABS 4.5  --   HGB 11.8* 12.6  HCT 36.8 37.0  MCV 123.1*  --   PLT 165  --    Basic Metabolic Panel: Recent Labs  Lab 04/16/20 0802 04/16/20 0806  NA 138 139  K 5.3* 5.2*  CL 101 101  CO2 28  --   GLUCOSE 230* 210*  BUN 38* 40*  CREATININE 1.42* 1.30*  CALCIUM 9.8  --    GFR: CrCl cannot be calculated (Unknown ideal weight.). Liver Function Tests: Recent Labs  Lab 04/16/20 0802  AST 73*  ALT 72*  ALKPHOS 49  BILITOT 1.2  PROT 6.7  ALBUMIN 3.7   No results for input(s): LIPASE, AMYLASE in the last 168 hours. Recent  Labs  Lab 04/16/20 1056  AMMONIA 48*   Coagulation Profile: Recent Labs  Lab 04/16/20 0802  INR 1.2   Cardiac Enzymes: No results for input(s): CKTOTAL, CKMB, CKMBINDEX, TROPONINI in the last 168 hours. BNP (last 3 results) No results for input(s): PROBNP in the last 8760 hours. HbA1C: Recent Labs    04/16/20 0802 04/17/20 0327  HGBA1C 7.1* 6.9*   CBG: Recent Labs  Lab 04/16/20 1119 04/16/20 1454 04/16/20 2115 04/17/20 0605 04/17/20 1056  GLUCAP 205* 248* 243* 169* 189*   Lipid Profile: Recent Labs    04/17/20 0327  CHOL 131  HDL 19*  LDLCALC 82  TRIG 149  CHOLHDL 6.9   Thyroid Function Tests: No results for input(s): TSH, T4TOTAL, FREET4, T3FREE, THYROIDAB in the last 72 hours. Anemia Panel: No results for input(s): VITAMINB12, FOLATE, FERRITIN, TIBC, IRON, RETICCTPCT in the last 72 hours.    Radiology  Studies: I have reviewed all of the imaging during this hospital visit personally     Scheduled Meds: . [START ON 04/18/2020] aspirin  81 mg Oral Daily  . insulin aspart  0-6 Units Subcutaneous TID WC  . ticagrelor  180 mg Oral Once  . [START ON 04/18/2020] ticagrelor  90 mg Oral BID   Continuous Infusions:   LOS: 1 day        Roselina Burgueno Gerome Apley, MD

## 2020-04-17 NOTE — Progress Notes (Signed)
Occupational Therapy Treatment Patient Details Name: Andrea Kirk MRN: [id number] DOB: 09/03/1940 Today's Date: 04/17/2020    History of present illness Pt is a 80 y.o. F who presents after a fall with AMS. 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 Significant PMH: CAD, DM, HTN.   OT comments  PA requesting assist to help get patient back to bed, with OTA providing assist. Pt moaning in pain, and while A&O x4 demonstrates difficulty with attention as pt is posed a question, and trials off and stares off into the distance before perseverating on pain. This was noted to happen multiple times in session. Pt also wears O2 at home, however was becoming frequently entangled in line, requiring min A for transfers and minimal functional mobility. Due to decreased cognition, and findings in PT session, discharge recommendation updated to SNF. Therapy will continue to follow while in house.    Follow Up Recommendations  SNF;Supervision - Intermittent    Equipment Recommendations  3 in 1 bedside commode    Recommendations for Other Services      Precautions / Restrictions Precautions Precautions: Fall;Other (comment) Precaution Comments: monitor O2 Restrictions Weight Bearing Restrictions: No       Mobility Bed Mobility Overal bed mobility: Needs Assistance Bed Mobility: Sit to Supine     Supine to sit: HOB elevated;Supervision Sit to supine: Supervision   General bed mobility comments: Supervision with increased time.    Transfers Overall transfer level: Needs assistance Equipment used: Rolling walker (2 wheeled) Transfers: Sit to/from Stand Sit to Stand: Min assist         General transfer comment: Min A x2 to standing from chair and BSC    Balance Overall balance assessment: Needs assistance Sitting-balance support: Feet supported Sitting balance-Leahy Scale: Good      Standing balance support: Bilateral upper extremity supported Standing balance-Leahy Scale: Poor Standing balance comment: okay static standing for washing hands at sink, reliant on B UE support for mobility                           ADL either performed or assessed with clinical judgement   ADL Overall ADL's : Needs assistance/impaired     Grooming: Set up;Sitting                   Toilet Transfer: Ambulation;BSC;RW;Minimal assistance Toilet Transfer Details (indicate cue type and reason): requiring BSC due to no O2 extension into room, pt frequently getting caught in O2 line requiring min A for safety and max cues to complete Toileting- Clothing Manipulation and Hygiene: Supervision/safety;Sitting/lateral lean;Sit to/from stand Toileting - Clothing Manipulation Details (indicate cue type and reason): cues for safety     Functional mobility during ADLs: Minimal assistance;Rolling walker General ADL Comments: Pt with worsening cogntive deficits since OT eval, requiring min A in order to complete transfers and minimal functional mobility safely     Vision       Perception     Praxis      Cognition Arousal/Alertness: Awake/alert Behavior During Therapy: WFL for tasks assessed/performed Overall Cognitive Status: No family/caregiver present to determine baseline cognitive functioning                                 General Comments: Pt with worsening cognition in session than documented in OT  eval, increased time for processing, safety awareness, and continued difficulty processing        Exercises     Shoulder Instructions       General Comments Pt received on 2L O2, denies wearing O2 at baseline. Trial RA, but pt dropping to 85% seated in bed during conversation. Returned to 2L O2, SPO2 >95% for rest of session. Left on 2L.    Pertinent Vitals/ Pain       Pain Assessment: Faces Faces Pain Scale: Hurts whole lot Pain Location:  back Pain Descriptors / Indicators: Discomfort;Aching;Sore Pain Intervention(s): Monitored during session;Repositioned;Patient requesting pain meds-RN notified  Home Living                                          Prior Functioning/Environment              Frequency  Min 2X/week        Progress Toward Goals  OT Goals(current goals can now be found in the care plan section)  Progress towards OT goals: Progressing toward goals  Acute Rehab OT Goals Patient Stated Goal: to get rid of nausea and pain in back OT Goal Formulation: With patient Time For Goal Achievement: 05/01/20 Potential to Achieve Goals: Good  Plan Discharge plan needs to be updated    Co-evaluation                 AM-PAC OT "6 Clicks" Daily Activity     Outcome Measure   Help from another person eating meals?: None Help from another person taking care of personal grooming?: A Little Help from another person toileting, which includes using toliet, bedpan, or urinal?: A Little Help from another person bathing (including washing, rinsing, drying)?: A Little Help from another person to put on and taking off regular upper body clothing?: A Little Help from another person to put on and taking off regular lower body clothing?: A Lot 6 Click Score: 18    End of Session Equipment Utilized During Treatment: Rolling walker;Oxygen  OT Visit Diagnosis: Unsteadiness on feet (R26.81);Other abnormalities of gait and mobility (R26.89);Muscle weakness (generalized) (M62.81);Other symptoms and signs involving cognitive function   Activity Tolerance Patient limited by fatigue;Patient limited by pain   Patient Left in bed;with call bell/phone within reach;with bed alarm set   Nurse Communication Mobility status;Patient requests pain meds        Time: 5916-3846 OT Time Calculation (min): 14 min  Charges: OT Treatments $Self Care/Home Management : 8-22 mins  Corinne Ports E. Christophr Calix,  COTA/L Acute Rehabilitation Services [phone] [place] 04/17/2020, 3:25 PM

## 2020-04-18 DIAGNOSIS — I5032 Chronic diastolic (congestive) heart failure: Secondary | ICD-10-CM | POA: Diagnosis not present

## 2020-04-18 DIAGNOSIS — I639 Cerebral infarction, unspecified: Secondary | ICD-10-CM | POA: Diagnosis not present

## 2020-04-18 DIAGNOSIS — I10 Essential (primary) hypertension: Secondary | ICD-10-CM | POA: Diagnosis not present

## 2020-04-18 DIAGNOSIS — E1159 Type 2 diabetes mellitus with other circulatory complications: Secondary | ICD-10-CM | POA: Diagnosis not present

## 2020-04-18 DIAGNOSIS — R296 Repeated falls: Secondary | ICD-10-CM | POA: Diagnosis not present

## 2020-04-18 LAB — BASIC METABOLIC PANEL
Anion gap: 5 (ref 5–15)
BUN: 15 mg/dL (ref 8–23)
CO2: 31 mmol/L (ref 22–32)
Calcium: 8.7 mg/dL — ABNORMAL LOW (ref 8.9–10.3)
Chloride: 101 mmol/L (ref 98–111)
Creatinine, Ser: 0.69 mg/dL (ref 0.44–1.00)
GFR, Estimated: >60 mL/min (ref >60)
Glucose, Bld: 220 mg/dL — ABNORMAL HIGH (ref 70–99)
Potassium: 4.4 mmol/L (ref 3.5–5.1)
Sodium: 137 mmol/L (ref 135–145)

## 2020-04-18 LAB — GLUCOSE, CAPILLARY
Glucose-Capillary: 195 mg/dL — ABNORMAL HIGH (ref 70–99)
Glucose-Capillary: 301 mg/dL — ABNORMAL HIGH (ref 70–99)
Glucose-Capillary: 284 mg/dL — ABNORMAL HIGH (ref 70–99)
Glucose-Capillary: 160 mg/dL — ABNORMAL HIGH (ref 70–99)

## 2020-04-18 MED ORDER — ENOXAPARIN SODIUM 40 MG/0.4ML ~~LOC~~ SOLN
40.0000 mg | SUBCUTANEOUS | Status: DC
  Administered 2020-04-18: 40 mg via SUBCUTANEOUS
  Filled 2020-04-18 (×3): qty 0.4

## 2020-04-18 MED ORDER — GLIPIZIDE ER 5 MG PO TB24
5.0000 mg | ORAL_TABLET | Freq: Every day | ORAL | Status: DC
  Administered 2020-04-18 – 2020-04-20 (×3): 5 mg via ORAL
  Filled 2020-04-18 (×4): qty 1

## 2020-04-18 NOTE — Progress Notes (Signed)
Physical Therapy Treatment Patient Details Name: Andrea Kirk MRN: [id number] DOB: [birth year] Today's Date: 04/18/2020    History of Present Illness Pt is a 80 y.o. F who presents after a fall with AMS. 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 Significant PMH: CAD, DM, HTN.    PT Comments    Pt received in bed, willing to participate in PT. Seemed more alert and with somewhat better cognition today, but unable to progress mobility/gait distance due to B LE pain. Pt generally at min A to min guard level. Pt unaware of surrounding and needed cueing to navigate around obstacles in the hallway and to walk through doorway without bumping into the walls. Still believe pt would benefit from short term to address strength, improve balance, and increase independency. Will continue to follow acutely.    Follow Up Recommendations  SNF;Supervision for mobility/OOB     Equipment Recommendations  None recommended by PT    Recommendations for Other Services       Precautions / Restrictions Precautions Precautions: Fall;Other (comment) Precaution Comments: monitor O2 Restrictions Weight Bearing Restrictions: No    Mobility  Bed Mobility Overal bed mobility: Needs Assistance Bed Mobility: Supine to Sit;Sit to Supine     Supine to sit: HOB elevated;Supervision Sit to supine: Supervision;HOB elevated   General bed mobility comments: Supervision with increased time.    Transfers Overall transfer level: Needs assistance Equipment used: Rolling walker (2 wheeled) Transfers: Sit to/from Stand Sit to Stand: Min assist         General transfer comment: Min A for steadying, pt initially with poor hand placement, when PT asked "where should your hands go?" pt able to correct  Ambulation/Gait Ambulation/Gait assistance: Min guard Gait Distance (Feet): 60 Feet Assistive device: Rolling walker  (2 wheeled) Gait Pattern/deviations: Step-through pattern;Decreased dorsiflexion - right;Decreased step length - right;Trunk flexed Gait velocity: decreased   General Gait Details: Min guard for safety, no overt LOB, cueing for safe navigation with RW   Stairs             Wheelchair Mobility    Modified Rankin (Stroke Patients Only)       Balance Overall balance assessment: Needs assistance Sitting-balance support: Feet supported Sitting balance-Leahy Scale: Good     Standing balance support: Bilateral upper extremity supported Standing balance-Leahy Scale: Poor Standing balance comment: reliant on B UE support for mobility                            Cognition Arousal/Alertness: Awake/alert Behavior During Therapy: WFL for tasks assessed/performed Overall Cognitive Status: No family/caregiver present to determine baseline cognitive functioning                                 General Comments: Overall better cognition than previous PT session on 4/11 but still some decreased awareness of capabilities/deficits. Difficulty problem solving. Needed increased time for processing      Exercises      General Comments General comments (skin integrity, edema, etc.): Pt received on 2L O2, denies using O2 at baseline. Trialed RA, but pt dropping to 84% seated in bed during conversation. Returned to 2L O2 with SPO2 > 94% for rest of session. Left on 2L.      Pertinent Vitals/Pain Pain Assessment: Faces Faces Pain Scale: Hurts  even more Pain Location: back, B LEs Pain Descriptors / Indicators: Discomfort;Aching;Sore Pain Intervention(s): Monitored during session    Home Living                      Prior Function            PT Goals (current goals can now be found in the care plan section) Acute Rehab PT Goals Patient Stated Goal: get rid of pain PT Goal Formulation: With patient Time For Goal Achievement: 04/30/20 Potential to  Achieve Goals: Good Progress towards PT goals: Progressing toward goals    Frequency    Min 4X/week      PT Plan Current plan remains appropriate    Co-evaluation              AM-PAC PT "6 Clicks" Mobility   Outcome Measure  Help needed turning from your back to your side while in a flat bed without using bedrails?: A Little Help needed moving from lying on your back to sitting on the side of a flat bed without using bedrails?: A Little Help needed moving to and from a bed to a chair (including a wheelchair)?: A Little Help needed standing up from a chair using your arms (e.g., wheelchair or bedside chair)?: A Little Help needed to walk in hospital room?: A Little Help needed climbing 3-5 steps with a railing? : A Little 6 Click Score: 18    End of Session Equipment Utilized During Treatment: Gait belt;Oxygen Activity Tolerance: Patient tolerated treatment well;Patient limited by pain Patient left: with call bell/phone within reach;in bed;with bed alarm set Nurse Communication: Mobility status PT Visit Diagnosis: Unsteadiness on feet (R26.81);Difficulty in walking, not elsewhere classified (R26.2)     Time:  -     Charges:                        Rosita Kea, SPT

## 2020-04-18 NOTE — Progress Notes (Signed)
Inpatient Diabetes Program Recommendations  AACE/ADA: New Consensus Statement on Inpatient Glycemic Control (2015)  Target Ranges:  Prepandial:   less than 140 mg/dL      Peak postprandial:   less than 180 mg/dL (1-2 hours)      Critically ill patients:  140 - 180 mg/dL   Lab Results  Component Value Date   GLUCAP 195 (H) 04/18/2020   HGBA1C 6.9 (H) 04/17/2020    Review of Glycemic Control Results for [name], Andrea Kirk (MRN [id number]) as of 04/18/2020 09:17  Ref. Range 04/17/2020 06:05 04/17/2020 10:56 04/17/2020 16:04 04/17/2020 21:02 04/18/2020 06:07  Glucose-Capillary Latest Ref Range: 70 - 99 mg/dL 169 (H) 189 (H) 210 (H) 255 (H) 195 (H)    Diabetes history: DM 2 Outpatient Diabetes medications: Glipizide 10 gm Daily Current orders for Inpatient glycemic control: Novolog 0-6 units tid  A1c 6.9% on 4/11  Inpatient Diabetes Program Recommendations:    -  Increase Novolog to 0-9 units tid -   Add Novolog hs scale  Thanks,  Tama Headings RN, MSN, BC-ADM Inpatient Diabetes Coordinator Team Pager [phone] (8a-5p)

## 2020-04-18 NOTE — Progress Notes (Signed)
STROKE TEAM PROGRESS NOTE   INTERVAL HISTORY Her physical therapists are at the bedside.  Patient declined to get inpatient loop recorder but states that she will consider it as an outpatient.  No neurological complaints.  Neurological exam is unchanged.  Vital signs are stable.  Vitals:   04/18/20 0417 04/18/20 0815 04/18/20 1143 04/18/20 1300  BP: (!) 142/78 (!) 152/72 107/63   Pulse: 67 65 64   Resp: 13 18 18    Temp: 98.7 F (37.1 C) 98.6 F (37 C) 99.4 F (37.4 C)   TempSrc: Oral Oral Oral   SpO2: 97% 98% 96%   Weight:    81.6 kg  Height:    5' 5"  (1.651 m)   CBC:  Recent Labs  Lab 04/16/20 0802 04/16/20 0806  WBC 6.7  --   NEUTROABS 4.5  --   HGB 11.8* 12.6  HCT 36.8 37.0  MCV 123.1*  --   PLT 165  --    Basic Metabolic Panel:  Recent Labs  Lab 04/16/20 0802 04/16/20 0806 04/18/20 0149  NA 138 139 137  K 5.3* 5.2* 4.4  CL 101 101 101  CO2 28  --  31  GLUCOSE 230* 210* 220*  BUN 38* 40* 15  CREATININE 1.42* 1.30* 0.69  CALCIUM 9.8  --  8.7*   Lipid Panel:  Recent Labs  Lab 04/17/20 0327  CHOL 131  TRIG 149  HDL 19*  CHOLHDL 6.9  VLDL 30  LDLCALC 82   HgbA1c:  Recent Labs  Lab 04/17/20 0327  HGBA1C 6.9*   Urine Drug Screen: No results for input(s): LABOPIA, COCAINSCRNUR, LABBENZ, AMPHETMU, THCU, LABBARB in the last 168 hours.  Alcohol Level No results for input(s): ETH in the last 168 hours.  IMAGING past 24 hours No results found.  PHYSICAL EXAM General: Pleasant elderly Caucasian lady awake alert in no distress, mildly irritated, remarks that she feels hot and sweaty HEENT: Normocephalic atraumatic CV: Regular rate rhythm Extremities: warm well perfused Neurological exam She is awake alert oriented to self, place, time, and situation. No gross aphasia noted but has some repetitive questioning and perseverations. Patient's brother appeared similar suggesting this is a personality trait of patient at baseline. Cranial nerves: Pupils are  equal round react light, extraocular movements appear unrestricted, no field cuts, face appears symmetric. Soft palate elevation symmetrical, shoulder shrug symmetric. Sensation intact to face bilaterally.  Motor exam: She is able to lift all 4 extremities antigravity without drift. Sensory exam: Intact to touch all over, Extinction is negative. Coordination: FTN intact  ASSESSMENT/PLAN Andrea Kirk is a 80 y.o. female extensive past medical history of cerebrovascular risk factors including coronary artery disease status post CABG in 1997, carotid stenosis status post bilateral CEA, diabetes, hypertension, hyperlipidemia, and spondylithesis. who presented post fall. EMS noted that patient appeared to have slurred speech and was confused and activated CODE STROKE. Patient denies having slurred speech, but is able to talk about her fall in detail. Patient neurological exam is improved mostly due to patient improved cognitive status compared to patient on arrival. Interestingly patient was not to have hyperkalemia and hyperammonemia that was treated with kayexalate. It is possible that patient was also suffering from metabolic encephalopathy and is improving due to hyperkalemia treatment. 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 Patient has been on ASA + Plavix and indicated that she took her medicine as prescribed. Patient's Plavix treatment  has been ongoing for at least 6 years 2/2 cardiac disease, but patient has now met criteria for failure of medication. Will discontinue patient Plavix and start patient on ASA+ Brilinta for 4 weeks and then ASA alone. Due to patient's frequent falls will also consul EP for loop recorder placement for further evaluation for A fib. Patient has a Primary cardiologist. Unfortunately patient's frequent falls remain a concern and Brilinta increase her risk for hemorrhage with a fall;  however will have PT evaluate patient and will continue cardiac workup as patient's stroke and falls may be cardiac related. Patient was noted to have what appear to be multiple punctate or subacute infracts in the L parietal and R occipital lobes again concerning for embolic etiology.  CT was concerning for a small remote L cerebellar infarct vs contusion as the patient indicated this area was where she hit her head.   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  Questionable right occipital and frontal infarcts also silent  Code Stroke CT head No acute abnormality. Small remote cerebellar infarct. ASPECTS 10.   CTA head & neck: No emergent finding w/ no perfusion deficit. 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 Prior CEA bilateral CEA's noted  MRI  : L temporal cortex infarct  2D Echo: LVEF 50-55%, Moderate LVH, PAH: 73.52mHg, Mild-Mod TVR, Ascending Aortic dilatation noted, IVC dilated.   LDL 82  HgbA1c 6.9  VTE prophylaxis - SCDs    Diet   Diet Heart Room service appropriate? Yes; Fluid consistency: Thin   Start ASA + one time dose of Brilinta 1850mloading dose and transition to ASA 8138m Brilinta 13m29mD    Consult to EP for loop recorder placement  aspirin 81 mg daily and clopidogrel 75 mg daily prior to admission, now on aspirin 81 mg daily and Brilinta (ticagrelor) 90 mg bid.   Therapy recommendations:  PT/OT  Disposition:  Pending  Hypertension  Home meds:  norvasc 5mg,70msinopril 5mg, 54mtable . Permissive hypertension (OK if < 220/120) but gradually normalize in 5-7 days . Long-term BP goal normotensive  Hyperlipidemia  Home meds:  rosuvastain 5mg,  15m 82, goal < 70  Continue statin at discharge  Diabetes type II Controlled  Home meds:  Glipizide 10mg  H67mc 6.9, goal < 7.0  CBGs Recent Labs    04/17/20 2102 04/18/20 0607 04/18/20 1110  GLUCAP 255* 195* 301*      SSI  Other Stroke Risk  Factors  Advanced Age >/= 65   Hx 35roke  Family hx stroke   Coronary artery disease   Hospital day # 2   Patient presented with recurrent falls likely mechanical and unrelated and MRI scan shows left temporal cortical infarct and questionable to other right occipital and frontal punctate lesions.  The infarcts appear to be silent with strong suspicion for underlying atrial fibrillation.  Recommend loop recorder but patient prefers to consider doing this as an outpatient..  Long discussion with the patient  and answered questions.  Recommend aspirin and Brilinta for 4 weeks followed by aspirin alone.  Aggressive risk factor modification.  Follow-up as an outpatient with stroke clinic in 6 weeks.  Stroke team will sign off.  Kindly call for questions.  Discussed with Dr. Arien anDelphia Gratesater than 50% time during this 25-minute visit was spent on counseling and coordination of care about her silent strokes and discussion about plan for evaluation and treatment and answering questions. Nihal Marzella SAntony Contras  Medical Director [facility] Pager: [phone] 04/18/2020 2:54 PM  To contact Stroke Continuity provider, please refer to [email or web address] After hours, contact General Neurology

## 2020-04-18 NOTE — Progress Notes (Signed)
Revisited with patient this morning. She has a lot on her mind, SNF for rehab and managing her home bills while away, etc  Back/neck pain her primary physical complaint. She is most comfortable with out patient follow up to revisit loop.  I will make EP follow up arrangements for her  Telemetry reviewed, SR only   Tommye Standard, PA-C

## 2020-04-18 NOTE — TOC Progression Note (Signed)
Transition of Care [facility]) - Progression Note    Patient Details  Name: Andrea Kirk MRN: [id number] Date of Birth: 01-14-1940  Transition of Care [facility]) CM/SW [place], [place] Phone Number: 04/18/2020, 2:36 PM  Clinical Narrative:     CSW gave bed offers to pt. Patient reviewed the list and decided on [facility]. t reports her husband wen there a few years ago and she was happy with his care. CSW reached out to [facility] to confirm thy can accept pt at DC. Admissions coordinator will call csw back with answer.   Expected Discharge Plan: [place] Barriers to Discharge: Continued Medical Work up  Expected Discharge Plan and Services Expected Discharge Plan: [place] Choice: [place] arrangements for the past 2 months: Apartment                                       Social Determinants of Health (SDOH) Interventions    Readmission Risk Interventions No flowsheet data found.  Emeterio Reeve, Latanya Presser, [place] Social Worker [phone]

## 2020-04-18 NOTE — Progress Notes (Signed)
PROGRESS NOTE    Andrea Kirk  FGH:[id number] DOB: 24-Mar-1940 DOA: 04/16/2020 PCP: Aura Dials, MD    Brief Narrative:  Andrea Kirk was admitted to the hospital with a working diagnosis of acute ischemic CVA at the left temporal cortex and punctate acute or subacute infarcts in the left parietal and right occipital cortex/ cardioembolic.  80 year old female past medical history for carotid artery disease, coronary artery disease, type II [place] mellitus and dyslipidemia who presents with altered mentation and mechanical falls.  She lives in assisted living, she was found down, confused and weak.  She was transported to the hospital for further evaluation.  On her initial physical examination blood pressure 156/75, heart rate 86, respiratory rate 15, temperature 98.9, oxygen saturation 92%.  Lungs are clear to auscultation bilaterally, heart S1-S2, present rhythmic, soft abdomen, lower extremity edema.  Her strength was preserved.  Sodium 138, potassium 5.3, chloride 101, bicarb 28, BUN 38, creatinine 1.42, white cell count 6.7, hemoglobin 11.8, hematocrit 36.8, platelets 165.  SARS COVID-19 negative. Urinalysis specific gravity 1.043.  Head CT no acute findings, small remote left cerebellar infarct. Brain MRI with small acute infarct in the left temporal cortex, punctate acute or subacute infarcts in the left parietal and right occipital cortex.  Chest radiograph with cardiomegaly. EKG 87 bpm, normal axis, normal intervals, sinus rhythm, Q-wave lead I-aVL, Q waves lead II, no significant ST segment or T wave changes.  Patient has been placed on dual antiplatelet therapy, slowly improving symptoms, but not yet back to baseline, plan to transfer to SNF. Out patient loop recorder per EP.   Assessment & Plan:   Principal Problem:   CVA (cerebral vascular accident) ([place]) Active Problems:   Essential hypertension, benign   Diabetes mellitus ([place])   Chronic diastolic CHF (congestive  heart failure) ([facility])   Frequent falls   Hyperkalemia   Acute stroke due to ischemia ([place])    1. 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  Speech has been improving, but not yet back to baseline, continue to be very weak and deconditioned.    CT angiography with 80% atheromatous narrowing at the proximal left subclavian artery. High grade bilateral vertebral origin stenosis, high grade V 4 segment stenosis.  High grade right P1 stenosis.   Echocardiogram with preserved LV systolic function with EF 50 to 55%. Moderate LVH, mild reduction in RV systolic function, mild cavity enlargement, severe elevation of pulmonary artery systolic pressure 67.8 mmHg. Positive TR mild to moderate.  Moderate dilatation of ascending aorta.   Tolerating well antiplatelet therapy with asa and ticagrelor for 4 weeks and then continue aspirin alone.  Continue with high dose statin.  Patient will be transferred to SNF.   Follow up with EP for loop recorder implantation to rule out occult A fib, as outpatient.   2. HTN. Blood pressure this am 152/72 mmHg, will continue with amlodipine, and carvedilol. Plan to resume lisinopril in am.    3. T2DM uncontrolled with hyperglycemia. Dyslipidemia. Fasting glucose this am 220, capillary 195.  Continue with insulin sliding scale for glucose cover and monitoring.  Resume glipizide at a lower dose of 5 mg daily for now.   4. 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 No  acute exacerbation. Plan to resume furosemide in am. Continue blood pressure monitoring.   5. AKI on CKD 2./ hyperkalemia. Renal function with serum cr at 0,69, K at 4,4 and bicarbonate at 31.  Discontinue IV  fluids and follow up on renal function in am, if continue to be stable, plan to resume in am, furosemide and lisinopril per her home regimen.   6. Depression/ chronic back pain. On citalopram. Continue pain  control with hydrocodone and gabapentin.   7. Iron deficiency anemia. On iron supplementation.  Hgb and Hct stable no indication for PRBC transfusion.    Status is: Inpatient  Remains inpatient appropriate because:Unsafe d/c plan   Dispo: The patient is from: Home              Anticipated d/c is to: SNF              Patient currently is medically stable to d/c.   Difficult to place patient No   DVT prophylaxis: Enoxaparin   Code Status:   full  Family Communication:  No family a the bedside     Consultants:   Neurology    Subjective: Patient is feeling better but not yet back to baseline, continue to have slurred speech, and difficulty ambulating, no nausea or vomiting, no dyspnea or chest pain.,   Objective: Vitals:   04/17/20 2017 04/18/20 0000 04/18/20 0417 04/18/20 0815  BP: 127/69 (!) 154/77 (!) 142/78 (!) 152/72  Pulse: 62 66 67 65  Resp: 18 18 13 18   Temp: 98.7 F (37.1 C) 98.9 F (37.2 C) 98.7 F (37.1 C) 98.6 F (37 C)  TempSrc: Oral Oral Oral Oral  SpO2: 95% 99% 97% 98%    Intake/Output Summary (Last 24 hours) at 04/18/2020 0843 Last data filed at 04/18/2020 6606 Gross per 24 hour  Intake 0 ml  Output 600 ml  Net -600 ml   There were no vitals filed for this visit.  Examination:   General: Not in pain or dyspnea.  Neurology: Awake and alert, strength preserved, speech slow but comprehensible.  E ENT: mild pallor, no icterus, oral mucosa moist Cardiovascular: No JVD. S1-S2 present, rhythmic, no gallops, rubs, or murmurs. No lower extremity edema. Pulmonary: positive breath sounds bilaterally, adequate air movement, no wheezing, rhonchi or rales. Gastrointestinal. Abdomen soft and non tender Skin. No rashes Musculoskeletal: no joint deformities     Data Reviewed: I have personally reviewed following labs and imaging studies  CBC: Recent Labs  Lab 04/16/20 0802 04/16/20 0806  WBC 6.7  --   NEUTROABS 4.5  --   HGB 11.8* 12.6  HCT 36.8  37.0  MCV 123.1*  --   PLT 165  --    Basic Metabolic Panel: Recent Labs  Lab 04/16/20 0802 04/16/20 0806 04/18/20 0149  NA 138 139 137  K 5.3* 5.2* 4.4  CL 101 101 101  CO2 28  --  31  GLUCOSE 230* 210* 220*  BUN 38* 40* 15  CREATININE 1.42* 1.30* 0.69  CALCIUM 9.8  --  8.7*   GFR: CrCl cannot be calculated (Unknown ideal weight.). Liver Function Tests: Recent Labs  Lab 04/16/20 0802  AST 73*  ALT 72*  ALKPHOS 49  BILITOT 1.2  PROT 6.7  ALBUMIN 3.7   No results for input(s): LIPASE, AMYLASE in the last 168 hours. Recent Labs  Lab 04/16/20 1056  AMMONIA 48*   Coagulation Profile: Recent Labs  Lab 04/16/20 0802  INR 1.2   Cardiac Enzymes: No results for input(s): CKTOTAL, CKMB, CKMBINDEX, TROPONINI in the last 168 hours. BNP (last 3 results) No results for input(s): PROBNP in the last 8760 hours. HbA1C: Recent Labs    04/16/20 0802 04/17/20 0327  HGBA1C  7.1* 6.9*   CBG: Recent Labs  Lab 04/17/20 0605 04/17/20 1056 04/17/20 1604 04/17/20 2102 04/18/20 0607  GLUCAP 169* 189* 210* 255* 195*   Lipid Profile: Recent Labs    04/17/20 0327  CHOL 131  HDL 19*  LDLCALC 82  TRIG 149  CHOLHDL 6.9   Thyroid Function Tests: No results for input(s): TSH, T4TOTAL, FREET4, T3FREE, THYROIDAB in the last 72 hours. Anemia Panel: No results for input(s): VITAMINB12, FOLATE, FERRITIN, TIBC, IRON, RETICCTPCT in the last 72 hours.    Radiology Studies: I have reviewed all of the imaging during this hospital visit personally     Scheduled Meds: . amLODipine  5 mg Oral Daily  . vitamin C  1,000 mg Oral Daily  . aspirin  81 mg Oral Daily  . carvedilol  25 mg Oral BID WC  . citalopram  20 mg Oral q morning  . ferrous sulfate  325 mg Oral Daily  . gabapentin  300 mg Oral QID  . insulin aspart  0-6 Units Subcutaneous TID WC  . rosuvastatin  5 mg Oral Daily  . ticagrelor  90 mg Oral BID   Continuous Infusions: . sodium chloride 75 mL/hr at  04/18/20 0423     LOS: 2 days        Andrea Bergdoll Gerome Apley, MD

## 2020-04-19 DIAGNOSIS — I5032 Chronic diastolic (congestive) heart failure: Secondary | ICD-10-CM | POA: Diagnosis not present

## 2020-04-19 DIAGNOSIS — M545 Low back pain, unspecified: Secondary | ICD-10-CM

## 2020-04-19 DIAGNOSIS — J9601 Acute respiratory failure with hypoxia: Secondary | ICD-10-CM | POA: Diagnosis present

## 2020-04-19 DIAGNOSIS — E669 Obesity, unspecified: Secondary | ICD-10-CM | POA: Diagnosis present

## 2020-04-19 DIAGNOSIS — I2510 Atherosclerotic heart disease of native coronary artery without angina pectoris: Secondary | ICD-10-CM | POA: Diagnosis not present

## 2020-04-19 DIAGNOSIS — G8929 Other chronic pain: Secondary | ICD-10-CM

## 2020-04-19 HISTORY — DX: Acute respiratory failure with hypoxia: J96.01

## 2020-04-19 LAB — CBC WITH DIFFERENTIAL/PLATELET
Abs Immature Granulocytes: 0.02 10*3/uL (ref 0.00–0.07)
Basophils Absolute: 0 10*3/uL (ref 0.0–0.1)
Basophils Relative: 0 %
Eosinophils Absolute: 0.1 10*3/uL (ref 0.0–0.5)
Eosinophils Relative: 2 %
HCT: 32.7 % — ABNORMAL LOW (ref 36.0–46.0)
Hemoglobin: 10.5 g/dL — ABNORMAL LOW (ref 12.0–15.0)
Immature Granulocytes: 0 %
Lymphocytes Relative: 13 %
Lymphs Abs: 0.6 10*3/uL — ABNORMAL LOW (ref 0.7–4.0)
MCH: 39.9 pg — ABNORMAL HIGH (ref 26.0–34.0)
MCHC: 32.1 g/dL (ref 30.0–36.0)
MCV: 124.3 fL — ABNORMAL HIGH (ref 80.0–100.0)
Monocytes Absolute: 0.5 10*3/uL (ref 0.1–1.0)
Monocytes Relative: 12 %
Neutro Abs: 3.2 10*3/uL (ref 1.7–7.7)
Neutrophils Relative %: 73 %
Platelets: 139 10*3/uL — ABNORMAL LOW (ref 150–400)
RBC: 2.63 MIL/uL — ABNORMAL LOW (ref 3.87–5.11)
RDW: 14.5 % (ref 11.5–15.5)
WBC: 4.5 10*3/uL (ref 4.0–10.5)
nRBC: 0 % (ref 0.0–0.2)

## 2020-04-19 LAB — COMPREHENSIVE METABOLIC PANEL
ALT: 57 U/L — ABNORMAL HIGH (ref 0–44)
AST: 33 U/L (ref 15–41)
Albumin: 3 g/dL — ABNORMAL LOW (ref 3.5–5.0)
Alkaline Phosphatase: 47 U/L (ref 38–126)
Anion gap: 7 (ref 5–15)
BUN: 18 mg/dL (ref 8–23)
CO2: 32 mmol/L (ref 22–32)
Calcium: 9.2 mg/dL (ref 8.9–10.3)
Chloride: 99 mmol/L (ref 98–111)
Creatinine, Ser: 0.74 mg/dL (ref 0.44–1.00)
GFR, Estimated: >60 mL/min (ref >60)
Glucose, Bld: 235 mg/dL — ABNORMAL HIGH (ref 70–99)
Potassium: 4.8 mmol/L (ref 3.5–5.1)
Sodium: 138 mmol/L (ref 135–145)
Total Bilirubin: 1.7 mg/dL — ABNORMAL HIGH (ref 0.3–1.2)
Total Protein: 6.3 g/dL — ABNORMAL LOW (ref 6.5–8.1)

## 2020-04-19 LAB — MAGNESIUM: Magnesium: 1.7 mg/dL (ref 1.7–2.4)

## 2020-04-19 LAB — GLUCOSE, CAPILLARY
Glucose-Capillary: 142 mg/dL — ABNORMAL HIGH (ref 70–99)
Glucose-Capillary: 265 mg/dL — ABNORMAL HIGH (ref 70–99)
Glucose-Capillary: 231 mg/dL — ABNORMAL HIGH (ref 70–99)
Glucose-Capillary: 230 mg/dL — ABNORMAL HIGH (ref 70–99)

## 2020-04-19 LAB — PHOSPHORUS: Phosphorus: 2.6 mg/dL (ref 2.5–4.6)

## 2020-04-19 MED ORDER — INSULIN ASPART 100 UNIT/ML ~~LOC~~ SOLN
0.0000 [IU] | SUBCUTANEOUS | Status: DC
  Administered 2020-04-19: 3 [IU] via SUBCUTANEOUS
  Administered 2020-04-20: 2 [IU] via SUBCUTANEOUS
  Administered 2020-04-20: 3 [IU] via SUBCUTANEOUS
  Administered 2020-04-20: 2 [IU] via SUBCUTANEOUS

## 2020-04-19 NOTE — Progress Notes (Signed)
Physical Therapy Treatment Note  SATURATION QUALIFICATIONS: (This note is used to comply with regulatory documentation for home oxygen)  Patient Saturations on Room Air at Rest = 90%  Patient Saturations on Room Air while Ambulating = 86%  Patient Saturations on 2 Liters of oxygen while Ambulating = 93%  Please briefly explain why patient needs home oxygen: Patient requires supplemental oxygen to maintain oxygen saturations at acceptable, safe levels with physical activity.  Roney [place], [place]  Acute Rehabilitation Services Pager [phone] Office [phone]

## 2020-04-19 NOTE — Progress Notes (Signed)
Physical Therapy Treatment Patient Details Name: Andrea Kirk Sox MRN: [id number] DOB: 12/20/1940 Today's Date: 04/19/2020    History of Present Illness Pt is a 80 y.o. F who presents after a fall with AMS. 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 Significant PMH: CAD, DM, HTN.    PT Comments    Continuing work on functional mobility and activity tolerance;  Able to increase amb distance, min/minguard assist for safety; noted O2 sat drop with amb on Room Air   Follow Up Recommendations  SNF;Supervision for mobility/OOB     Equipment Recommendations  None recommended by PT    Recommendations for Other Services       Precautions / Restrictions Precautions Precautions: Fall;Other (comment) Precaution Comments: monitor O2    Mobility  Bed Mobility Overal bed mobility: Needs Assistance Bed Mobility: Supine to Sit       Sit to supine: Supervision        Transfers Overall transfer level: Needs assistance Equipment used: Rolling walker (2 wheeled) Transfers: Sit to/from Stand Sit to Stand: Min guard         General transfer comment: slow to rise, no physical assist needed  Ambulation/Gait Ambulation/Gait assistance: Min guard Gait Distance (Feet): 70 Feet Assistive device: Rolling walker (2 wheeled) Gait Pattern/deviations: Step-through pattern;Decreased dorsiflexion - right;Decreased step length - right;Trunk flexed     General Gait Details: Min guard for safety, no overt LOB, cueing for safe navigation with RW   Stairs             Wheelchair Mobility    Modified Rankin (Stroke Patients Only)       Balance Overall balance assessment: Needs assistance   Sitting balance-Leahy Scale: Good     Standing balance support: Bilateral upper extremity supported Standing balance-Leahy Scale: Poor Standing balance comment: can release walker in static standing,  reliant on B UE support for ambulation                            Cognition Arousal/Alertness: Awake/alert Behavior During Therapy: WFL for tasks assessed/performed Overall Cognitive Status: No family/caregiver present to determine baseline cognitive functioning                                 General Comments: pt complaining that her medicine schedule is off      Exercises      General Comments General comments (skin integrity, edema, etc.): Desatted to 87% with amb on Room air, restarted supplemmental O2 2L; see other PT note of this date for O2 qualifying walk      Pertinent Vitals/Pain Pain Assessment: Faces Faces Pain Scale: Hurts a little bit Pain Location: all over Pain Descriptors / Indicators: Discomfort Pain Intervention(s): Repositioned    Home Living                      Prior Function            PT Goals (current goals can now be found in the care plan section) Acute Rehab PT Goals Patient Stated Goal: Agreeable to walk PT Goal Formulation: With patient Time For Goal Achievement: 04/30/20 Potential to Achieve Goals: Good Progress towards PT goals: Progressing toward goals    Frequency    Min 4X/week      PT Plan Current  plan remains appropriate    Co-evaluation              AM-PAC PT "6 Clicks" Mobility   Outcome Measure  Help needed turning from your back to your side while in a flat bed without using bedrails?: A Little Help needed moving from lying on your back to sitting on the side of a flat bed without using bedrails?: A Little Help needed moving to and from a bed to a chair (including a wheelchair)?: A Little Help needed standing up from a chair using your arms (e.g., wheelchair or bedside chair)?: A Little Help needed to walk in hospital room?: A Little Help needed climbing 3-5 steps with a railing? : A Little 6 Click Score: 18    End of Session Equipment Utilized During Treatment: Gait  belt;Oxygen Activity Tolerance: Patient tolerated treatment well;Patient limited by pain Patient left: in chair;with call bell/phone within reach Nurse Communication: Mobility status PT Visit Diagnosis: Unsteadiness on feet (R26.81);Difficulty in walking, not elsewhere classified (R26.2)     Time: 8159-4707 PT Time Calculation (min) (ACUTE ONLY): 24 min  Charges:  $Gait Training: 23-37 mins                     Roney Marion, [place] Pager [phone] Office [place] 04/19/2020, 1:30 PM

## 2020-04-19 NOTE — Care Management Important Message (Signed)
Important Message  Patient Details  Name: Andrea Kirk MRN: [id number] Date of Birth: October 03, 1940   Medicare Important Message Given:  Yes     Quran Vasco [address] 04/19/2020, 11:58 AM

## 2020-04-19 NOTE — Progress Notes (Signed)
PROGRESS NOTE    Andrea Kirk  HQI:[id number] DOB: 11-Sep-1940 DOA: 04/16/2020 PCP: Aura Dials, MD     Brief Narrative:  Mrs. 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  80 year old WF PMHx CAD, DM type II, and dyslipidemia   Presents with altered mentation and mechanical falls. She lives in assisted living, she was found down, confused and weak. She was transported to the hospital for further evaluation. On her initial physical examination blood pressure 156/75, heart rate 86, respiratory rate 15, temperature 98.9, oxygen saturation 92%. Lungs are clear to auscultation bilaterally, heart S1-S2, present rhythmic, soft abdomen, lower extremity edema. Her strength was preserved.  Sodium 138, potassium 5.3, chloride 101, bicarb 28, BUN 38, creatinine 1.42, white cell count 6.7, hemoglobin 11.8, hematocrit 36.8, platelets 165. SARS COVID-19 negative. Urinalysis specific gravity 1.043.  Head CT no acute findings, small remote left cerebellar infarct. Brain MRI withsmallacute infarct in the left temporal cortex, punctate acute or subacute infarcts in the left parietal and right occipital cortex.  Chest radiograph with cardiomegaly. EKG 87 bpm, normal axis, normal intervals, sinus rhythm, Q-wave lead I-aVL, Q waves lead II, no significant ST segment or T wave changes.  Patient has been placed on dual antiplatelet therapy, slowly improving symptoms, but not yet back to baseline, plan to transfer to SNF. Out patient loop recorder per EP.    Subjective: Afebrile overnight, A/O x4.  Patient upset because her medications are not being dispensed at the exact times that she takes them at home.   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  Questionable right occipital and frontal infarcts also silent  Code Stroke CT head No acute abnormality. Small remote cerebellar infarct. ASPECTS 10.   CTA head & neck: No emergent finding w/ no perfusion deficit. 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 Prior CEA bilateral CEA's noted  MRI  : L temporal cortex infarct  2D Echo: LVEF 50-55%, Moderate LVH, PAH: 73.53mmHg, Mild-Mod TVR, Ascending Aortic dilatation noted, IVC dilated.   LDL 82  HgbA1c 6.9  VTE prophylaxis - SCDs  Start ASA + one time dose of Brilinta 180mg  loading dose and transition to ASA 81mg  + Brilinta 90mg  BID    Consult to EP for loop recorder placement  aspirin 81 mg daily and clopidogrel 75 mg daily prior to admission, now on aspirin 81 mg daily and Brilinta (ticagrelor) 90 mg bid.   Therapy recommendations:  PT/OT  Disposition:  Pending  Stroke team signed off 4/12  Await teen SNF placement -Follow up with EP for loop recorder implantation to rule out occult A fib, as outpatient.   Chronic diastolic CHF/pulmonary HTN -Strict in and out -Daily weight -Amlodipine 5 mg daily -Coreg 25 mg BID -Brilinta 90 mg BID -Permissive HTN (okay if<220/120) gradually normalize over the next 5 to 7 days  Essential HTN -See CHF  HLD -Crestor 5 mg  daily -LDL= 82, goal<70 . Blood pressure this am 152/72 mmHg, will continue with amlodipine, and carvedilol. Plan to resume lisinopril in am.    DM type II controlled with hyperglycemia -4/11 hemoglobin A1c= 6.9 -Glipizide 5 mg daily -4/13 sensitive SSI  Acute on CKD stage II  (baseline Cr 0.80) Lab Results  Component Value Date   CREATININE 0.74 04/19/2020   CREATININE 0.69 04/18/2020   CREATININE 1.30 (H) 04/16/2020   CREATININE 1.42 (H) 04/16/2020   CREATININE 0.80 10/05/2019  -At baseline  Depression -Citalopram 20 mg daily  Chronic back pain -Norco 10-325 mg TID  Iron deficiency anemia? -4/13 anemia panel pending   Obese (Body mass index is 29.95 kg/m). -  Acute respiratory failure with hypoxia -Per patient not on home O2 chronically -Patient meets criteria for home O2 SATURATION QUALIFICATIONS: (This note is used to comply with regulatory documentation for home oxygen) Patient Saturations on Room Air at Rest = 90% Patient Saturations on Room Air while Ambulating = 86% Patient Saturations on 2 Liters of oxygen while Ambulating = 93% Please briefly explain why patient needs home oxygen: Patient requires supplemental oxygen to maintain oxygen saturations at acceptable, safe levels with physical activity. -2 L O2 via [place] titrate to maintain SPO2> 92% --Provide Inogen portable home O2 concentrator    DVT prophylaxis: Lovenox Code Status: Full Family Communication:  Status is: Inpatient    Dispo: The patient is from: Home              Anticipated d/c is to: SNF pending              Anticipated d/c date is: 4/17              Patient currently unstable      Consultants:  Stroke team EP   Procedures/Significant Events:  4/10 echocardiogram:Left Ventricle: Left ventricular ejection fraction, by estimation, is 50  to 55%. Left ventricular ejection fraction by 3D volume is 51 %. The left  ventricle has low normal function. The left ventricle has no regional wall  motion abnormalities. The left  ventricular internal cavity size was normal in size. There is moderate  left ventricular hypertrophy. Left ventricular diastolic parameters are  consistent with Grade II diastolic dysfunction (pseudonormalization).  Elevated left  ventricular end-diastolic  pressure.   Right Ventricle: The right ventricular size is mildly enlarged. Right  vetricular wall thickness was not well visualized. Right ventricular  systolic function is mildly reduced. There is severely elevated pulmonary  artery systolic pressure. The tricuspid  regurgitant velocity is 3.81 m/s, and with an assumed right atrial  pressure of 15 mmHg, the estimated right ventricular systolic pressure is  59.5 mmHg.   Left Atrium: Left atrial size was normal in size.   Right Atrium: Right atrial size was normal in size.   Pericardium: There is no evidence of pericardial effusion.   Mitral Valve: The mitral valve is grossly normal. Moderate mitral valve  regurgitation. MV peak gradient, 10.0 mmHg. The mean mitral valve gradient  is 3.0 mmHg.   Tricuspid Valve: The tricuspid valve is grossly normal. Tricuspid valve  regurgitation is mild to moderate.   Aortic Valve: The aortic valve is grossly normal. Aortic valve  regurgitation is not visualized. No aortic stenosis is present.   Pulmonic Valve: The pulmonic valve was grossly normal. Pulmonic valve  regurgitation is not visualized.   Aorta: Aortic dilatation noted. There is moderate dilatation of the  ascending aorta, measuring 42 mm.   Venous:  The inferior vena cava is dilated in size with less than 50%  respiratory variability, suggesting right atrial pressure of 15 mmHg.  4/10 MRI brain W. Wo contrast:1. Small acute infarct in the left temporal cortex. Punctate acute or subacute infarcts in the left parietal and right occipital cortex. 2. Truncated study due to claustrophobia. 4/10 CT cerebral perfusion W contrast: No emergent finding.  No perfusion deficit. 2. 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 High-grade bilateral vertebral origin stenosis. High-grade left V4 segment stenosis. 3. Carotid endarterectomy on both sides. No proximal flow limiting stenosis in the  anterior circulation. 4. High-grade right P1 segment stenosis. 4/10 CTA neck W or WO contrast;No emergent finding.  No perfusion deficit. 2. 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 High-grade bilateral vertebral origin stenosis. High-grade left V4 segment stenosis. 3. Carotid endarterectomy on both sides. No proximal flow limiting stenosis in the anterior circulation. 4. High-grade right P1 segment stenosis. 4/10 CT C-spine;Degenerative changes in the cervical spine without evidence for an acute fracture. 2. Loss of cervical lordosis. This can be related to patient positioning, muscle spasm or soft tissue injury 4/10 CT head code stroke;No acute finding. 2. Small remote left cerebellar infarct.  I have personally reviewed and interpreted all radiology studies and my findings are as above.  VENTILATOR SETTINGS: Nasal cannula 4/13 Flow SPO2 97%   Cultures   Antimicrobials:    Devices    LINES / TUBES:      Continuous Infusions:   Objective: Vitals:   04/19/20 0532 04/19/20 0811 04/19/20 1154 04/19/20 1832  BP: (!) 146/77 129/68 134/68 117/83  Pulse: 65 66 73 63  Resp: 18 18 17 19   Temp: 98.4 F (36.9 C) 97.8 F (36.6 C) (!) 100.4 F (38 C) 99.1 F (37.3 C)  TempSrc: Oral Oral Oral Oral  SpO2: 98% 97% 95% 95%  Weight:      Height:       No intake or output data in the 24 hours ending 04/19/20 1833 Filed Weights   04/18/20 1300  Weight: 81.6 kg    Examination:  General: A/O x4, positive acute respiratory distress Eyes: negative scleral hemorrhage, negative anisocoria, negative icterus ENT: Negative Runny nose, negative gingival bleeding, Neck:  Negative scars, masses, torticollis, lymphadenopathy, JVD Lungs: Clear to auscultation bilaterally without wheezes or crackles Cardiovascular: Regular rate and rhythm without murmur gallop or rub normal S1 and S2 Abdomen: negative abdominal pain, nondistended, positive soft, bowel  sounds, no rebound, no ascites, no appreciable mass Extremities: No significant cyanosis, clubbing, or edema bilateral lower extremities Skin: Negative rashes, lesions, ulcers Psychiatric:  Negative depression, negative anxiety, negative fatigue, negative mania  Central nervous system:  Cranial nerves II through XII intact, tongue/uvula midline, all extremities muscle strength 5/5, sensation intact throughout, positive mild dysarthria, negative expressive aphasia, negative receptive aphasia.  .     Data Reviewed: Care during the described time interval was provided by me .  I have reviewed this patient's available data, including medical history, events of note, physical examination, and all test results as part of my evaluation.  CBC: Recent Labs  Lab 04/16/20 0802 04/16/20 0806 04/19/20 1000  WBC 6.7  --  4.5  NEUTROABS 4.5  --  3.2  HGB 11.8* 12.6 10.5*  HCT 36.8 37.0 32.7*  MCV 123.1*  --  124.3*  PLT 165  --  696*   Basic Metabolic Panel: Recent Labs  Lab 04/16/20 0802 04/16/20 0806 04/18/20 0149 04/19/20 1000  NA 138 139 137 138  K 5.3* 5.2* 4.4 4.8  CL 101 101 101 99  CO2 28  --  31 32  GLUCOSE 230* 210* 220* 235*  BUN 38* 40* 15 18  CREATININE 1.42* 1.30* 0.69 0.74  CALCIUM 9.8  --  8.7* 9.2  MG  --   --   --  1.7  PHOS  --   --   --  2.6   GFR: Estimated Creatinine Clearance: 60.1 mL/min (by C-G formula based on SCr of 0.74 mg/dL). Liver Function Tests: Recent Labs  Lab 04/16/20 0802 04/19/20 1000  AST 73* 33  ALT 72* 57*  ALKPHOS 49 47  BILITOT 1.2 1.7*  PROT 6.7 6.3*  ALBUMIN 3.7 3.0*   No results for input(s): LIPASE, AMYLASE in the last 168 hours. Recent Labs  Lab 04/16/20 1056  AMMONIA 48*   Coagulation Profile: Recent Labs  Lab 04/16/20 0802  INR 1.2   Cardiac Enzymes: No results for input(s): CKTOTAL, CKMB, CKMBINDEX, TROPONINI in the last 168 hours. BNP (last 3 results) No results for input(s): PROBNP in the last 8760  hours. HbA1C: Recent Labs    04/17/20 0327  HGBA1C 6.9*   CBG: Recent Labs  Lab 04/18/20 1617 04/18/20 2141 04/19/20 0605 04/19/20 1147 04/19/20 1629  GLUCAP 284* 160* 142* 265* 231*   Lipid Profile: Recent Labs    04/17/20 0327  CHOL 131  HDL 19*  LDLCALC 82  TRIG 149  CHOLHDL 6.9   Thyroid Function Tests: No results for input(s): TSH, T4TOTAL, FREET4, T3FREE, THYROIDAB in the last 72 hours. Anemia Panel: No results for input(s): VITAMINB12, FOLATE, FERRITIN, TIBC, IRON, RETICCTPCT in the last 72 hours. Sepsis Labs: No results for input(s): PROCALCITON, LATICACIDVEN in the last 168 hours.  Recent Results (from the past 240 hour(s))  Resp Panel by RT-PCR (Flu A&B, Covid) Nasopharyngeal Swab     Status: None   Collection Time: 04/16/20 11:00 AM   Specimen: Nasopharyngeal Swab; Nasopharyngeal(NP) swabs in vial transport medium  Result Value Ref Range Status   SARS Coronavirus 2 by RT PCR NEGATIVE NEGATIVE Final    Comment: (NOTE) SARS-CoV-2 target nucleic acids are NOT DETECTED.  The SARS-CoV-2 RNA is generally detectable in upper respiratory specimens during the acute phase of infection. The lowest concentration of SARS-CoV-2 viral copies this assay can detect is 138 copies/mL. A negative result does not preclude SARS-Cov-2 infection and should not be used as the sole basis for treatment or other patient management decisions. A negative result may occur with  improper specimen collection/handling, submission of specimen other than nasopharyngeal swab, presence of viral mutation(s) within the areas targeted by this assay, and inadequate number of viral copies(<138 copies/mL). A negative result must be combined with clinical observations, patient history, and epidemiological information. The expected result is Negative.  Fact Sheet for Patients:  EntrepreneurPulse.com.au  Fact Sheet for Healthcare Providers:   IncredibleEmployment.be  This test is no t yet approved or cleared by the [place] FDA and  has been authorized for detection and/or diagnosis of SARS-CoV-2 by FDA under an Emergency Use Authorization (EUA). This EUA will remain  in effect (meaning this test can be used) for the duration of the COVID-19 declaration under Section 564(b)(1) of the Act, 21 U.S.C.section 360bbb-3(b)(1), unless the authorization is terminated  or revoked sooner.       Influenza A by PCR NEGATIVE NEGATIVE Final   Influenza B by PCR NEGATIVE NEGATIVE Final    Comment: (NOTE) The  Xpert Xpress SARS-CoV-2/FLU/RSV plus assay is intended as an aid in the diagnosis of influenza from Nasopharyngeal swab specimens and should not be used as a sole basis for treatment. Nasal washings and aspirates are unacceptable for Xpert Xpress SARS-CoV-2/FLU/RSV testing.  Fact Sheet for Patients: EntrepreneurPulse.com.au  Fact Sheet for Healthcare Providers: IncredibleEmployment.be  This test is not yet approved or cleared by the [place] FDA and has been authorized for detection and/or diagnosis of SARS-CoV-2 by FDA under an Emergency Use Authorization (EUA). This EUA will remain in effect (meaning this test can be used) for the duration of the COVID-19 declaration under Section 564(b)(1) of the Act, 21 U.S.C. section 360bbb-3(b)(1), unless the authorization is terminated or revoked.  Performed at [facility], [place] [address] [place], [place] [address]          Radiology Studies: No results found.      Scheduled Meds: . amLODipine  5 mg Oral Daily  . vitamin C  1,000 mg Oral Daily  . aspirin  81 mg Oral Daily  . carvedilol  25 mg Oral BID WC  . citalopram  20 mg Oral q morning  . enoxaparin (LOVENOX) injection  40 mg Subcutaneous Q24H  . ferrous sulfate  325 mg Oral Daily  . gabapentin  300 mg Oral QID  . glipiZIDE  5 mg Oral Q  breakfast  . insulin aspart  0-9 Units Subcutaneous Q4H  . rosuvastatin  5 mg Oral Daily  . ticagrelor  90 mg Oral BID   Continuous Infusions:   LOS: 3 days    Time spent:40 min    Zabella Wease, Geraldo Docker, MD [facility]   If 7PM-7AM, please contact night-coverage 04/19/2020, 6:33 PM

## 2020-04-19 NOTE — TOC Progression Note (Signed)
Transition of Care [facility]) - Progression Note    Patient Details  Name: Andrea Kirk MRN: [id number] Date of Birth: [birth year]  Transition of Care [facility]) CM/SW [place], [place] Phone Number: 04/19/2020, 5:03 PM  Clinical Narrative:     CSW confirmed that they can accept pt at discharge. NCM statrted insurance auth and csw faxed in clinicals. CSW requested covid test from MD. Admission paperwork was signed by pt.   Expected Discharge Plan: [place] Barriers to Discharge: Continued Medical Work up  Expected Discharge Plan and Services Expected Discharge Plan: [place] Choice: [place] arrangements for the past 2 months: Apartment                                       Social Determinants of Health (SDOH) Interventions    Readmission Risk Interventions No flowsheet data found.  Emeterio Reeve, Latanya Presser, [place] Social Worker [phone]

## 2020-04-19 NOTE — Progress Notes (Signed)
Occupational Therapy Treatment Patient Details Name: Andrea Kirk MRN: [id number] DOB: 1940/07/09 Today's Date: 04/19/2020    History of present illness Pt is a 80 y.o. F who presents after a fall with AMS. 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 Significant PMH: CAD, DM, HTN.   OT comments  Pt requesting to return to bed due to discomfort in chair. Ambulated with min to min guard assist and RW to bathroom for toileting, sink for grooming and around bed to get into opposite side. Pt needing min to min guard assist for ADL completed this visit.   Follow Up Recommendations  SNF    Equipment Recommendations  3 in 1 bedside commode    Recommendations for Other Services      Precautions / Restrictions Precautions Precautions: Fall;Other (comment) Precaution Comments: monitor O2       Mobility Bed Mobility Overal bed mobility: Needs Assistance Bed Mobility: Sit to Supine       Sit to supine: Supervision        Transfers Overall transfer level: Needs assistance Equipment used: Rolling walker (2 wheeled) Transfers: Sit to/from Stand Sit to Stand: Min guard         General transfer comment: slow to rise, no physical assist needed    Balance Overall balance assessment: Needs assistance   Sitting balance-Leahy Scale: Good     Standing balance support: Bilateral upper extremity supported Standing balance-Leahy Scale: Poor Standing balance comment: can release walker in static standing, reliant on B UE support for ambulation                           ADL either performed or assessed with clinical judgement   ADL Overall ADL's : Needs assistance/impaired Eating/Feeding: Set up;Bed level   Grooming: Wash/dry hands;Standing;Min Teacher, music: Ambulation;RW;Minimal assistance   Toileting- Clothing Manipulation and Hygiene:  Supervision/safety;Sitting/lateral lean;Sit to/from stand       Functional mobility during ADLs: Therapist, art      Cognition Arousal/Alertness: Awake/alert Behavior During Therapy: WFL for tasks assessed/performed Overall Cognitive Status: No family/caregiver present to determine baseline cognitive functioning                                 General Comments: pt complaining that her medicine schedule is off        Exercises     Shoulder Instructions       General Comments      Pertinent Vitals/ Pain       Pain Assessment: Faces Faces Pain Scale: Hurts a little bit Pain Location: all over Pain Descriptors / Indicators: Discomfort Pain Intervention(s): Repositioned  Home Living                                          Prior Functioning/Environment              Frequency  Min 2X/week        Progress Toward Goals  OT Goals(current goals can now be  found in the care plan section)  Progress towards OT goals: Progressing toward goals  Acute Rehab OT Goals Patient Stated Goal: get rid of pain OT Goal Formulation: With patient Time For Goal Achievement: 05/01/20 Potential to Achieve Goals: Good  Plan Discharge plan remains appropriate    Co-evaluation                 AM-PAC OT "6 Clicks" Daily Activity     Outcome Measure   Help from another person eating meals?: A Little Help from another person taking care of personal grooming?: A Little Help from another person toileting, which includes using toliet, bedpan, or urinal?: A Little Help from another person bathing (including washing, rinsing, drying)?: A Little Help from another person to put on and taking off regular upper body clothing?: A Little Help from another person to put on and taking off regular lower body clothing?: A Little 6 Click Score: 18    End of Session Equipment Utilized During  Treatment: Rolling walker;Oxygen  OT Visit Diagnosis: Unsteadiness on feet (R26.81);Other abnormalities of gait and mobility (R26.89);Muscle weakness (generalized) (M62.81);Other symptoms and signs involving cognitive function   Activity Tolerance Patient limited by fatigue   Patient Left in bed;with call bell/phone within reach;with bed alarm set   Nurse Communication          Time: 1055-1110 OT Time Calculation (min): 15 min  Charges: OT General Charges $OT Visit: 1 Visit OT Treatments $Self Care/Home Management : 8-22 mins  Nestor Lewandowsky, OTR/L Acute Rehabilitation Services Pager: [phone] Office: [phone]   Malka So 04/19/2020, 11:19 AM

## 2020-04-19 NOTE — Care Management (Signed)
Submitted insurance auth ref number [id number] fax [place]

## 2020-04-20 DIAGNOSIS — E785 Hyperlipidemia, unspecified: Secondary | ICD-10-CM | POA: Diagnosis present

## 2020-04-20 DIAGNOSIS — Z741 Need for assistance with personal care: Secondary | ICD-10-CM | POA: Diagnosis not present

## 2020-04-20 DIAGNOSIS — I10 Essential (primary) hypertension: Secondary | ICD-10-CM | POA: Diagnosis present

## 2020-04-20 DIAGNOSIS — D509 Iron deficiency anemia, unspecified: Secondary | ICD-10-CM | POA: Diagnosis not present

## 2020-04-20 DIAGNOSIS — R6889 Other general symptoms and signs: Secondary | ICD-10-CM | POA: Diagnosis not present

## 2020-04-20 DIAGNOSIS — N179 Acute kidney failure, unspecified: Secondary | ICD-10-CM | POA: Diagnosis present

## 2020-04-20 DIAGNOSIS — R262 Difficulty in walking, not elsewhere classified: Secondary | ICD-10-CM | POA: Diagnosis not present

## 2020-04-20 DIAGNOSIS — E1165 Type 2 diabetes mellitus with hyperglycemia: Secondary | ICD-10-CM | POA: Diagnosis not present

## 2020-04-20 DIAGNOSIS — Z743 Need for continuous supervision: Secondary | ICD-10-CM | POA: Diagnosis not present

## 2020-04-20 DIAGNOSIS — J9601 Acute respiratory failure with hypoxia: Secondary | ICD-10-CM | POA: Diagnosis not present

## 2020-04-20 DIAGNOSIS — D649 Anemia, unspecified: Secondary | ICD-10-CM | POA: Diagnosis not present

## 2020-04-20 DIAGNOSIS — I499 Cardiac arrhythmia, unspecified: Secondary | ICD-10-CM | POA: Diagnosis not present

## 2020-04-20 DIAGNOSIS — I639 Cerebral infarction, unspecified: Secondary | ICD-10-CM | POA: Diagnosis not present

## 2020-04-20 DIAGNOSIS — M6281 Muscle weakness (generalized): Secondary | ICD-10-CM | POA: Diagnosis not present

## 2020-04-20 DIAGNOSIS — D519 Vitamin B12 deficiency anemia, unspecified: Secondary | ICD-10-CM | POA: Diagnosis not present

## 2020-04-20 DIAGNOSIS — I272 Pulmonary hypertension, unspecified: Secondary | ICD-10-CM | POA: Diagnosis present

## 2020-04-20 DIAGNOSIS — G629 Polyneuropathy, unspecified: Secondary | ICD-10-CM | POA: Diagnosis not present

## 2020-04-20 DIAGNOSIS — N183 Chronic kidney disease, stage 3 unspecified: Secondary | ICD-10-CM | POA: Diagnosis not present

## 2020-04-20 DIAGNOSIS — E7800 Pure hypercholesterolemia, unspecified: Secondary | ICD-10-CM

## 2020-04-20 DIAGNOSIS — I2510 Atherosclerotic heart disease of native coronary artery without angina pectoris: Secondary | ICD-10-CM | POA: Diagnosis not present

## 2020-04-20 DIAGNOSIS — R279 Unspecified lack of coordination: Secondary | ICD-10-CM | POA: Diagnosis not present

## 2020-04-20 DIAGNOSIS — M549 Dorsalgia, unspecified: Secondary | ICD-10-CM | POA: Diagnosis not present

## 2020-04-20 DIAGNOSIS — M545 Low back pain, unspecified: Secondary | ICD-10-CM | POA: Diagnosis not present

## 2020-04-20 DIAGNOSIS — E1122 Type 2 diabetes mellitus with diabetic chronic kidney disease: Secondary | ICD-10-CM | POA: Diagnosis not present

## 2020-04-20 DIAGNOSIS — I5030 Unspecified diastolic (congestive) heart failure: Secondary | ICD-10-CM | POA: Diagnosis not present

## 2020-04-20 DIAGNOSIS — N182 Chronic kidney disease, stage 2 (mild): Secondary | ICD-10-CM | POA: Diagnosis not present

## 2020-04-20 DIAGNOSIS — F32A Depression, unspecified: Secondary | ICD-10-CM | POA: Diagnosis not present

## 2020-04-20 DIAGNOSIS — I5032 Chronic diastolic (congestive) heart failure: Secondary | ICD-10-CM | POA: Diagnosis not present

## 2020-04-20 DIAGNOSIS — E6609 Other obesity due to excess calories: Secondary | ICD-10-CM

## 2020-04-20 DIAGNOSIS — G8929 Other chronic pain: Secondary | ICD-10-CM | POA: Diagnosis not present

## 2020-04-20 DIAGNOSIS — E119 Type 2 diabetes mellitus without complications: Secondary | ICD-10-CM | POA: Diagnosis not present

## 2020-04-20 LAB — COMPREHENSIVE METABOLIC PANEL
ALT: 46 U/L — ABNORMAL HIGH (ref 0–44)
AST: 24 U/L (ref 15–41)
Albumin: 3 g/dL — ABNORMAL LOW (ref 3.5–5.0)
Alkaline Phosphatase: 45 U/L (ref 38–126)
Anion gap: 5 (ref 5–15)
BUN: 24 mg/dL — ABNORMAL HIGH (ref 8–23)
CO2: 31 mmol/L (ref 22–32)
Calcium: 9.2 mg/dL (ref 8.9–10.3)
Chloride: 100 mmol/L (ref 98–111)
Creatinine, Ser: 0.95 mg/dL (ref 0.44–1.00)
GFR, Estimated: >60 mL/min (ref >60)
Glucose, Bld: 162 mg/dL — ABNORMAL HIGH (ref 70–99)
Potassium: 5.3 mmol/L — ABNORMAL HIGH (ref 3.5–5.1)
Sodium: 136 mmol/L (ref 135–145)
Total Bilirubin: 1.4 mg/dL — ABNORMAL HIGH (ref 0.3–1.2)
Total Protein: 6.3 g/dL — ABNORMAL LOW (ref 6.5–8.1)

## 2020-04-20 LAB — FERRITIN: Ferritin: 573 ng/mL — ABNORMAL HIGH (ref 11–307)

## 2020-04-20 LAB — RETICULOCYTES
Immature Retic Fract: 18.9 % — ABNORMAL HIGH (ref 2.3–15.9)
RBC.: 2.61 MIL/uL — ABNORMAL LOW (ref 3.87–5.11)
Retic Count, Absolute: 157.1 10*3/uL (ref 19.0–186.0)
Retic Ct Pct: 6 % — ABNORMAL HIGH (ref 0.4–3.1)

## 2020-04-20 LAB — IRON AND TIBC
Iron: 28 ug/dL (ref 28–170)
Saturation Ratios: 11 % (ref 10.4–31.8)
TIBC: 245 ug/dL — ABNORMAL LOW (ref 250–450)
UIBC: 217 ug/dL

## 2020-04-20 LAB — GLUCOSE, CAPILLARY
Glucose-Capillary: 157 mg/dL — ABNORMAL HIGH (ref 70–99)
Glucose-Capillary: 162 mg/dL — ABNORMAL HIGH (ref 70–99)
Glucose-Capillary: 208 mg/dL — ABNORMAL HIGH (ref 70–99)
Glucose-Capillary: 260 mg/dL — ABNORMAL HIGH (ref 70–99)

## 2020-04-20 LAB — FOLATE: Folate: 20.4 ng/mL (ref >5.9)

## 2020-04-20 LAB — VITAMIN B12: Vitamin B-12: 1019 pg/mL — ABNORMAL HIGH (ref 180–914)

## 2020-04-20 LAB — PHOSPHORUS: Phosphorus: 2.9 mg/dL (ref 2.5–4.6)

## 2020-04-20 LAB — SARS CORONAVIRUS 2 (TAT 6-24 HRS): SARS Coronavirus 2: NEGATIVE

## 2020-04-20 LAB — MAGNESIUM: Magnesium: 1.8 mg/dL (ref 1.7–2.4)

## 2020-04-20 MED ORDER — GLIPIZIDE ER 5 MG PO TB24: 5.0000 mg | ORAL_TABLET | Freq: Every day | ORAL | 0 refills | Status: DC

## 2020-04-20 MED ORDER — SODIUM ZIRCONIUM CYCLOSILICATE 10 G PO PACK
10.0000 g | PACK | Freq: Once | ORAL | Status: AC
  Administered 2020-04-20: 10 g via ORAL
  Filled 2020-04-20: qty 1

## 2020-04-20 MED ORDER — TICAGRELOR 90 MG PO TABS: 90.0000 mg | ORAL_TABLET | Freq: Two times a day (BID) | ORAL | 0 refills | Status: DC

## 2020-04-20 NOTE — TOC Transition Note (Signed)
Transition of Care [facility]) - CM/SW Discharge Note   Patient Details  Name: Andrea Kirk MRN: [id number] Date of Birth: October 06, 1940  Transition of Care [facility]) CM/SW Contact:  Emeterio Reeve, [place] Phone Number: 04/20/2020, 3:04 PM   Clinical Narrative:     Patient will DC to: [facility] Anticipated DC date: 04/20/20 Family notified: Pt will notify  Transport by: Corey Harold     Per MD patient ready for DC to [facility]. RN, patient, patient's family, and facility notified of DC. Discharge Summary and FL2 sent to facility. DC packet on chart. Ambulance transport requested for patient.    RN to call report to [phone].  CSW will sign off for now as social work intervention is no longer needed. Please consult [place] again if new needs arise.   Final next level of care: Skilled Nursing Facility Barriers to Discharge: Barriers Resolved   Patient Goals and CMS Choice Patient states their goals for this hospitalization and ongoing recovery are:: to return to home CMS Medicare.gov Compare Post Acute Care list provided to:: Patient Choice offered to / list presented to : Patient  Discharge Placement              Patient chooses bed at: [facility] Patient to be transferred to facility by: ptar Name of family member notified: Pt will notify family Patient and family notified of of transfer: 04/20/20  Discharge Plan and Services     Post Acute Care Choice: Home Health,Durable Medical Equipment                               Social Determinants of Health (SDOH) Interventions     Readmission Risk Interventions No flowsheet data found.   Emeterio Reeve, Latanya Presser, [place] Social Worker [phone]

## 2020-04-20 NOTE — Progress Notes (Signed)
Physical Therapy Treatment Patient Details Name: Andrea Kirk MRN: [id number] DOB: Feb 24, 1940 Today's Date: 04/20/2020    History of Present Illness Pt is a 80 y.o. F who presents after a fall with AMS. 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 Significant PMH: CAD, DM, HTN.    PT Comments    Pt received in supine, agreeable to therapy session and with good participation and tolerance for transfer and gait training. Pt remains heavily reliant on UE support of RW and with decreased safety at times as she tends to lift 1 hand off RW when distracted. Pt with increased WOB on 1L O2 [place] and needs 2L O2 [place] with exertion to keep SpO2 >92% as well as frequent standing breaks to achieve household distance gait tasks. Increased time/effort for bed mobility and needs cues for safety with transfers. Pt continues to benefit from PT services to progress toward functional mobility goals. Continue to recommend SNF.   Follow Up Recommendations  SNF;Supervision for mobility/OOB     Equipment Recommendations  None recommended by PT    Recommendations for Other Services       Precautions / Restrictions Precautions Precautions: Fall;Other (comment) Precaution Comments: monitor O2 Restrictions Weight Bearing Restrictions: No    Mobility  Bed Mobility Overal bed mobility: Needs Assistance Bed Mobility: Supine to Sit;Sit to Supine     Supine to sit: Supervision Sit to supine: Min guard   General bed mobility comments: HOB flat/no rails    Transfers Overall transfer level: Needs assistance Equipment used: Rolling walker (2 wheeled) Transfers: Sit to/from Stand Sit to Stand: Min guard         General transfer comment: slow to rise, no physical assist needed but needs some cues for technique/hand placement; x2 reps  Ambulation/Gait Ambulation/Gait assistance: Min guard Gait Distance (Feet): 35  Feet (x3 with prolonged standing breaks between gait trials) Assistive device: Rolling walker (2 wheeled) Gait Pattern/deviations: Decreased dorsiflexion - right;Decreased step length - right;Trunk flexed;Step-to pattern;Shuffle;Decreased stride length Gait velocity: grossly <0.2 m/s   General Gait Details: Min guard for safety, no overt LOB, cueing for safe navigation with RW and at times has difficulty maintaining R grip well, hand does not fall off RW but tends to slide backward slowly on grip; max cues for improved step length/height   Stairs             Wheelchair Mobility    Modified Rankin (Stroke Patients Only)       Balance Overall balance assessment: Needs assistance Sitting-balance support: Feet supported Sitting balance-Leahy Scale: Good Sitting balance - Comments: static sitting and weight shifting no lOB   Standing balance support: Bilateral upper extremity supported Standing balance-Leahy Scale: Poor Standing balance comment: can release walker with U UE in static standing, reliant on B UE support for ambulation, min guard for safety at minimum due to decreased safety with RW at times                            Cognition Arousal/Alertness: Awake/alert Behavior During Therapy: WFL for tasks assessed/performed Overall Cognitive Status: No family/caregiver present to determine baseline cognitive functioning                                 General Comments: increased time to process 1-step commands, needs some  hints for problem solving/sequencing activities; difficulty following 2-step commands and at times unsafely lifting hand off of RW during dynamic standing tasks (decreased safety awareness); incontinent of urine      Exercises Other Exercises Other Exercises: supine BLE AROM: ankle pumps; seated BLE AROM: LAQ, hip flexion 1x10 reps ea    General Comments General comments (skin integrity, edema, etc.): SpO2 92% on 1L O2 [place] with  exertion however with subjectively increased work of breathing; increased O2 to 2L and SpO2 rises to 94-96% with exertion; HR 60's-70's bpm; SpO2 99% resting on 2L O2 [place]      Pertinent Vitals/Pain Pain Assessment: Faces Faces Pain Scale: Hurts a little bit Pain Location: all over Pain Descriptors / Indicators: Discomfort Pain Intervention(s): Monitored during session;Repositioned;Other (comment) (encouraged her to sit up in chair for lunch but pt refusing)    Home Living                      Prior Function            PT Goals (current goals can now be found in the care plan section) Acute Rehab PT Goals Patient Stated Goal: to take care of myself PT Goal Formulation: With patient Time For Goal Achievement: 04/30/20 Potential to Achieve Goals: Good Progress towards PT goals: Progressing toward goals    Frequency    Min 4X/week      PT Plan Current plan remains appropriate       AM-PAC PT "6 Clicks" Mobility   Outcome Measure  Help needed turning from your back to your side while in a flat bed without using bedrails?: A Little Help needed moving from lying on your back to sitting on the side of a flat bed without using bedrails?: A Little Help needed moving to and from a bed to a chair (including a wheelchair)?: A Little Help needed standing up from a chair using your arms (e.g., wheelchair or bedside chair)?: A Little Help needed to walk in hospital room?: A Little Help needed climbing 3-5 steps with a railing? : A Lot 6 Click Score: 17    End of Session Equipment Utilized During Treatment: Gait belt;Oxygen Activity Tolerance: Patient tolerated treatment well;Patient limited by fatigue Patient left: with call bell/phone within reach;in bed;with bed alarm set;Other (comment) (purewick replaced) Nurse Communication: Mobility status PT Visit Diagnosis: Unsteadiness on feet (R26.81);Difficulty in walking, not elsewhere classified (R26.2)     Time:  5456-2563 PT Time Calculation (min) (ACUTE ONLY): 24 min  Charges:  $Gait Training: 8-22 mins $Therapeutic Activity: 8-22 mins                     Jassmine Vandruff P., PTA Acute Rehabilitation Services Pager: [phone] Office: [place] 04/20/2020, 2:32 PM

## 2020-04-20 NOTE — Progress Notes (Signed)
Pt IV's removed. Tele removed, ccmd aware. Pt has all belongings and understands d/c instructions. Pt d/c via PTAR to [facility].

## 2020-04-20 NOTE — Progress Notes (Incomplete)
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  80 year old WF PMHx CAD, DM type II, and dyslipidemia   Presents with altered mentation and mechanical falls. She lives in assisted living, she was found down, confused and weak. She was transported to the hospital for further evaluation. On her initial physical examination blood pressure 156/75, heart rate 86, respiratory rate 15, temperature 98.9, oxygen saturation 92%. Lungs are clear to auscultation bilaterally, heart S1-S2, present rhythmic, soft abdomen, lower extremity edema. Her strength was preserved.  Sodium 138, potassium 5.3, chloride 101, bicarb 28, BUN 38, creatinine 1.42, white cell count 6.7, hemoglobin 11.8, hematocrit 36.8, platelets 165. SARS COVID-19 negative. Urinalysis specific gravity 1.043.  Head CT no acute findings, small remote left cerebellar infarct. Brain MRI withsmallacute infarct in the left temporal cortex, punctate acute or subacute infarcts in the left parietal and right occipital cortex.  Chest radiograph with cardiomegaly. EKG 87 bpm, normal axis, normal intervals, sinus rhythm, Q-wave lead I-aVL, Q waves lead II, no significant ST segment or T wave changes.  Patient has been placed on dual antiplatelet therapy, slowly improving symptoms, but not yet back to baseline, plan to transfer to SNF. Out patient loop recorder per EP.    Subjective: 4/14 T-max overnight 38 C,     afebrile overnight, A/O x4.  Patient upset because her medications are not being dispensed at the exact times that she takes them at home.   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  Questionable right occipital and frontal infarcts also silent  Code Stroke CT head No acute abnormality. Small remote cerebellar infarct. ASPECTS 10.   CTA head & neck: No emergent finding w/ no perfusion deficit. 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 Prior CEA bilateral CEA's noted  MRI  : L temporal cortex infarct  2D Echo: LVEF 50-55%, Moderate LVH, PAH: 73.19mmHg, Mild-Mod TVR, Ascending Aortic dilatation noted, IVC dilated.   LDL 82  HgbA1c 6.9  VTE prophylaxis - SCDs  Start ASA + one time dose of Brilinta 180mg  loading dose and transition to ASA 81mg  + Brilinta 90mg  BID    Consult to EP for loop recorder placement  aspirin 81 mg daily and clopidogrel 75 mg daily prior to admission, now on aspirin 81 mg daily and Brilinta (ticagrelor) 90 mg bid.   Therapy recommendations:  PT/OT  Disposition:  Pending  Stroke team signed off 4/12  Await teen SNF placement -Follow up with EP for loop recorder implantation to rule out occult A fib, as outpatient.   Chronic diastolic CHF/pulmonary HTN -Strict in and out -Daily weight -Amlodipine 5 mg daily -Coreg 25 mg BID -Brilinta 90 mg BID -Permissive HTN (okay if<220/120) gradually normalize over the next 5 to 7 days  Essential HTN -See CHF  HLD -Crestor 5 mg daily -LDL= 82, goal<70 . Blood pressure this am 152/72 mmHg, will continue with amlodipine, and carvedilol. Plan to resume lisinopril in am.    DM type II controlled with hyperglycemia -4/11 hemoglobin A1c= 6.9 -Glipizide 5 mg daily -4/13  sensitive SSI  Acute on CKD stage II (baseline Cr 0.80) Lab Results  Component Value Date   CREATININE 0.95 04/20/2020   CREATININE 0.74 04/19/2020   CREATININE 0.69 04/18/2020   CREATININE 1.30 (H) 04/16/2020   CREATININE 1.42 (H) 04/16/2020  -At baseline  Depression -Citalopram 20 mg daily  Chronic back pain -Norco 10-325 mg TID  Iron deficiency anemia? -4/13 anemia panel pending   Obese (Body mass index is 29.95 kg/m). -  Hyperkalemia***** - 4/14 Lokelma 10 g x 1  Acute respiratory failure with hypoxia -Per patient not on home O2 chronically -Patient meets criteria for home O2 SATURATION QUALIFICATIONS: (This note is used to comply with regulatory documentation for home oxygen) Patient Saturations on Room Air at Rest = 90% Patient Saturations on Room Air while Ambulating = 86% Patient Saturations on 2 Liters of oxygen while Ambulating = 93% Please briefly explain why patient needs home oxygen: Patient requires supplemental oxygen to maintain oxygen saturations at acceptable, safe levels with physical activity. -2 L O2 via [place] titrate to maintain SPO2> 92% --Provide Inogen portable home O2 concentrator    DVT prophylaxis: Lovenox Code Status: Full Family Communication:  Status is: Inpatient    Dispo: The patient is from: Home              Anticipated d/c is to: SNF pending              Anticipated d/c date is: 4/17              Patient currently unstable      Consultants:  Stroke team EP   Procedures/Significant Events:  4/10 echocardiogram:Left Ventricle: Left ventricular ejection fraction, by estimation, is 50  to 55%. Left ventricular ejection fraction by 3D volume is 51 %. The left  ventricle has low normal function. The left ventricle has no regional wall  motion abnormalities. The left  ventricular internal cavity size was normal in size. There is moderate  left ventricular hypertrophy. Left ventricular diastolic parameters are  consistent  with Grade II diastolic dysfunction (pseudonormalization).  Elevated left ventricular end-diastolic  pressure.   Right Ventricle: The right ventricular size is mildly enlarged. Right  vetricular wall thickness was not well visualized. Right ventricular  systolic function is mildly reduced. There is severely elevated pulmonary  artery systolic pressure. The tricuspid  regurgitant velocity is 3.81 m/s, and with an assumed right atrial  pressure of 15 mmHg, the estimated right ventricular systolic pressure is  76.7 mmHg.   Left Atrium: Left atrial size was normal in size.   Right Atrium: Right atrial size was normal in size.   Pericardium: There is no evidence of pericardial effusion.   Mitral Valve: The mitral valve is grossly normal. Moderate mitral valve  regurgitation. MV peak gradient, 10.0 mmHg. The mean mitral valve gradient  is 3.0 mmHg.   Tricuspid Valve: The tricuspid valve is grossly normal. Tricuspid valve  regurgitation is mild to moderate.   Aortic Valve: The aortic valve is grossly normal. Aortic valve  regurgitation is not visualized. No aortic stenosis is present.   Pulmonic Valve: The pulmonic valve was grossly normal. Pulmonic valve  regurgitation is not visualized.   Aorta:  Aortic dilatation noted. There is moderate dilatation of the  ascending aorta, measuring 42 mm.   Venous: The inferior vena cava is dilated in size with less than 50%  respiratory variability, suggesting right atrial pressure of 15 mmHg.  4/10 MRI brain W. Wo contrast:1. Small acute infarct in the left temporal cortex. Punctate acute or subacute infarcts in the left parietal and right occipital cortex. 2. Truncated study due to claustrophobia. 4/10 CT cerebral perfusion W contrast: No emergent finding.  No perfusion deficit. 2. 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 High-grade bilateral vertebral origin stenosis. High-grade left V4 segment stenosis. 3.  Carotid endarterectomy on both sides. No proximal flow limiting stenosis in the anterior circulation. 4. High-grade right P1 segment stenosis. 4/10 CTA neck W or WO contrast;No emergent finding.  No perfusion deficit. 2. 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 High-grade bilateral vertebral origin stenosis. High-grade left V4 segment stenosis. 3. Carotid endarterectomy on both sides. No proximal flow limiting stenosis in the anterior circulation. 4. High-grade right P1 segment stenosis. 4/10 CT C-spine;Degenerative changes in the cervical spine without evidence for an acute fracture. 2. Loss of cervical lordosis. This can be related to patient positioning, muscle spasm or soft tissue injury 4/10 CT head code stroke;No acute finding. 2. Small remote left cerebellar infarct.  I have personally reviewed and interpreted all radiology studies and my findings are as above.  VENTILATOR SETTINGS: Nasal cannula 4/14 Flow 2 L/min SPO2 100%   Cultures   Antimicrobials:    Devices    LINES / TUBES:      Continuous Infusions:   Objective: Vitals:   04/19/20 2029 04/20/20 0016 04/20/20 0400 04/20/20 0809  BP: 127/60 131/65 112/72 137/66  Pulse: 63 63 (!) 59 61  Resp: 18 18 18 14   Temp: 98 F (36.7 C) 98.5 F (36.9 C) 98.5 F (36.9 C) 98.8 F (37.1 C)  TempSrc: Oral Oral Oral Oral  SpO2: 96% 98% 98% 100%  Weight:      Height:        Intake/Output Summary (Last 24 hours) at 04/20/2020 6378 Last data filed at 04/19/2020 2340 Gross per 24 hour  Intake 240 ml  Output 600 ml  Net -360 ml   Filed Weights   04/18/20 1300  Weight: 81.6 kg    Examination:  General: A/O x4, positive acute respiratory distress Eyes: negative scleral hemorrhage, negative anisocoria, negative icterus ENT: Negative Runny nose, negative gingival bleeding, Neck:  Negative scars, masses, torticollis, lymphadenopathy, JVD Lungs: Clear to auscultation bilaterally  without wheezes or crackles Cardiovascular: Regular rate and rhythm without murmur gallop or rub normal S1 and S2 Abdomen: negative abdominal pain, nondistended, positive soft, bowel sounds, no rebound, no ascites, no appreciable mass Extremities: No significant cyanosis, clubbing, or edema bilateral lower extremities Skin: Negative rashes, lesions, ulcers Psychiatric:  Negative depression, negative anxiety, negative fatigue, negative mania  Central nervous system:  Cranial nerves II through XII intact, tongue/uvula midline, all extremities muscle strength 5/5, sensation intact throughout, positive mild dysarthria, negative expressive aphasia, negative receptive aphasia.  .     Data Reviewed: Care during the described time interval was provided by me .  I have reviewed this patient's available data, including medical history, events of note, physical examination, and all test results as part of my evaluation.  CBC: Recent Labs  Lab 04/16/20 0802 04/16/20 0806 04/19/20 1000  WBC 6.7  --  4.5  NEUTROABS 4.5  --  3.2  HGB  11.8* 12.6 10.5*  HCT 36.8 37.0 32.7*  MCV 123.1*  --  124.3*  PLT 165  --  161*   Basic Metabolic Panel: Recent Labs  Lab 04/16/20 0802 04/16/20 0806 04/18/20 0149 04/19/20 1000 04/20/20 0149  NA 138 139 137 138 136  K 5.3* 5.2* 4.4 4.8 5.3*  CL 101 101 101 99 100  CO2 28  --  31 32 31  GLUCOSE 230* 210* 220* 235* 162*  BUN 38* 40* 15 18 24*  CREATININE 1.42* 1.30* 0.69 0.74 0.95  CALCIUM 9.8  --  8.7* 9.2 9.2  MG  --   --   --  1.7 1.8  PHOS  --   --   --  2.6 2.9   GFR: Estimated Creatinine Clearance: 50.6 mL/min (by C-G formula based on SCr of 0.95 mg/dL). Liver Function Tests: Recent Labs  Lab 04/16/20 0802 04/19/20 1000 04/20/20 0149  AST 73* 33 24  ALT 72* 57* 46*  ALKPHOS 49 47 45  BILITOT 1.2 1.7* 1.4*  PROT 6.7 6.3* 6.3*  ALBUMIN 3.7 3.0* 3.0*   No results for input(s): LIPASE, AMYLASE in the last 168 hours. Recent Labs  Lab  04/16/20 1056  AMMONIA 48*   Coagulation Profile: Recent Labs  Lab 04/16/20 0802  INR 1.2   Cardiac Enzymes: No results for input(s): CKTOTAL, CKMB, CKMBINDEX, TROPONINI in the last 168 hours. BNP (last 3 results) No results for input(s): PROBNP in the last 8760 hours. HbA1C: No results for input(s): HGBA1C in the last 72 hours. CBG: Recent Labs  Lab 04/19/20 1629 04/19/20 2025 04/20/20 0011 04/20/20 0420 04/20/20 0910  GLUCAP 231* 230* 162* 157* 208*   Lipid Profile: No results for input(s): CHOL, HDL, LDLCALC, TRIG, CHOLHDL, LDLDIRECT in the last 72 hours. Thyroid Function Tests: No results for input(s): TSH, T4TOTAL, FREET4, T3FREE, THYROIDAB in the last 72 hours. Anemia Panel: Recent Labs    04/20/20 0149  VITAMINB12 1,019*  FOLATE 20.4  FERRITIN 573*  TIBC 245*  IRON 28  RETICCTPCT 6.0*   Sepsis Labs: No results for input(s): PROCALCITON, LATICACIDVEN in the last 168 hours.  Recent Results (from the past 240 hour(s))  Resp Panel by RT-PCR (Flu A&B, Covid) Nasopharyngeal Swab     Status: None   Collection Time: 04/16/20 11:00 AM   Specimen: Nasopharyngeal Swab; Nasopharyngeal(NP) swabs in vial transport medium  Result Value Ref Range Status   SARS Coronavirus 2 by RT PCR NEGATIVE NEGATIVE Final    Comment: (NOTE) SARS-CoV-2 target nucleic acids are NOT DETECTED.  The SARS-CoV-2 RNA is generally detectable in upper respiratory specimens during the acute phase of infection. The lowest concentration of SARS-CoV-2 viral copies this assay can detect is 138 copies/mL. A negative result does not preclude SARS-Cov-2 infection and should not be used as the sole basis for treatment or other patient management decisions. A negative result may occur with  improper specimen collection/handling, submission of specimen other than nasopharyngeal swab, presence of viral mutation(s) within the areas targeted by this assay, and inadequate number of viral copies(<138  copies/mL). A negative result must be combined with clinical observations, patient history, and epidemiological information. The expected result is Negative.  Fact Sheet for Patients:  EntrepreneurPulse.com.au  Fact Sheet for Healthcare Providers:  IncredibleEmployment.be  This test is no t yet approved or cleared by the [place] FDA and  has been authorized for detection and/or diagnosis of SARS-CoV-2 by FDA under an Emergency Use Authorization (EUA). This EUA will remain  in effect (meaning this test can be used) for the duration of the COVID-19 declaration under Section 564(b)(1) of the Act, 21 U.S.C.section 360bbb-3(b)(1), unless the authorization is terminated  or revoked sooner.       Influenza A by PCR NEGATIVE NEGATIVE Final   Influenza B by PCR NEGATIVE NEGATIVE Final    Comment: (NOTE) The Xpert Xpress SARS-CoV-2/FLU/RSV plus assay is intended as an aid in the diagnosis of influenza from Nasopharyngeal swab specimens and should not be used as a sole basis for treatment. Nasal washings and aspirates are unacceptable for Xpert Xpress SARS-CoV-2/FLU/RSV testing.  Fact Sheet for Patients: EntrepreneurPulse.com.au  Fact Sheet for Healthcare Providers: IncredibleEmployment.be  This test is not yet approved or cleared by the [place] FDA and has been authorized for detection and/or diagnosis of SARS-CoV-2 by FDA under an Emergency Use Authorization (EUA). This EUA will remain in effect (meaning this test can be used) for the duration of the COVID-19 declaration under Section 564(b)(1) of the Act, 21 U.S.C. section 360bbb-3(b)(1), unless the authorization is terminated or revoked.  Performed at [facility], [place] [address], [place], [place] [address]          Radiology Studies: No results found.      Scheduled Meds: . amLODipine  5 mg Oral Daily  . vitamin C  1,000 mg  Oral Daily  . aspirin  81 mg Oral Daily  . carvedilol  25 mg Oral BID WC  . citalopram  20 mg Oral q morning  . enoxaparin (LOVENOX) injection  40 mg Subcutaneous Q24H  . ferrous sulfate  325 mg Oral Daily  . gabapentin  300 mg Oral QID  . glipiZIDE  5 mg Oral Q breakfast  . insulin aspart  0-9 Units Subcutaneous Q4H  . rosuvastatin  5 mg Oral Daily  . ticagrelor  90 mg Oral BID   Continuous Infusions:   LOS: 4 days    Time spent:40 min    WOODS, Geraldo Docker, MD [facility]   If 7PM-7AM, please contact night-coverage 04/20/2020, 9:29 AM

## 2020-04-20 NOTE — Progress Notes (Addendum)
Tried to call report to [facility] but no answer. Wrote my contact number on pt's d/c packet for facility to call me if they have any questions.

## 2020-04-20 NOTE — Discharge Summary (Signed)
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 Questionable right occipital and frontal infarcts also silent  Code Stroke CT head No acute abnormality. Small remote cerebellar infarct. ASPECTS 10.   CTA head & neck: No emergent finding w/ no perfusion deficit. 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 Prior CEA bilateral CEA's noted  MRI : L temporal cortex infarct  2D Echo: LVEF 50-55%, Moderate LVH, PAH: 73.72mmHg, Mild-Mod TVR, Ascending Aortic dilatation noted, IVC dilated.   SAY30  HgbA1c6.9  Start ASA + one time dose of Brilinta 180mg loading doseand transition to ASA 81mg  + Brilinta 90mg  BID   Consult to EP for loop recorder placement  aspirin 81 mg daily and clopidogrel 75 mg dailyprior to admission, now on aspirin 81 mg daily and Brilinta (ticagrelor) 90 mg bid.   Stroke team signed off 4/12  Await teen SNF placement -Follow up with EP for loop recorder implantation to rule out occult A fib, as outpatient.   Chronic diastolic CHF/pulmonary HTN -Strict in and out -Daily weight -Amlodipine 5 mg daily -Coreg 25 mg BID -Brilinta 90 mg BID -Permissive HTN (okay if<220/120) gradually normalize over the next 5 to 7 days  Essential HTN -See CHF  HLD -Crestor 5 mg daily -LDL= 82, goal<70 .Blood pressure this am 152/72 mmHg, will continue withamlodipine, and carvedilol. Plan to resume lisinopril in am.  DM type II controlled with hyperglycemia -4/11 hemoglobin A1c= 6.9 -Glipizide 5 mg daily -4/13 sensitive SSI  Acute on CKD stage II (baseline Cr 0.80) Lab Results  Component Value Date   CREATININE 0.95 04/20/2020   CREATININE 0.74 04/19/2020   CREATININE 0.69 04/18/2020    CREATININE 1.30 (H) 04/16/2020   CREATININE 1.42 (H) 04/16/2020   Depression -Citalopram 20 mg daily  Chronic back pain -Norco 10-325 mg TID  Iron deficiency anemia? -4/13 anemia panel pending   Obese (Body mass index is 29.95 kg/m). - PCP to educate patient on weight loss strategies  Hyperkalemia - 4/14 Lokelma 10 g x 1  Acute respiratory failure with hypoxia -Per patient not on home O2 chronically -Patient meets criteria for home O2 SATURATION QUALIFICATIONS: (Thisnote is usedto comply with regulatory documentation for home oxygen) Patient Saturations on Room Air at Rest =90% Patient Saturations on [employer] while Ambulating =86% Patient Saturations on2Liters of oxygen while Ambulating = 93% Please briefly explain why patient needs home oxygen: Patient requires supplemental oxygen to maintain oxygen saturations at acceptable, safe levels with physical activity. -2 L O2 via [place] titrate to maintain SPO2> 92% --Provide Inogen portable home O2 concentrator   Discharge Diagnoses:  Principal Problem:   CVA (cerebral vascular accident) ([place]) Active Problems:   Coronary artery disease   Essential hypertension, benign   Diabetes mellitus ([place])   Chronic diastolic CHF (congestive heart failure) ([place])   Frequent falls   Hyperkalemia   Acute stroke due to ischemia ([place])   Acute respiratory failure with hypoxia ([place])   Obese   Chronic back pain   Pulmonary hypertension ([place])   Controlled type 2 diabetes mellitus with hyperglycemia ([place])   Essential hypertension   HLD (hyperlipidemia)   Acute renal failure superimposed on stage 2 chronic kidney disease ([place])   Iron deficiency anemia   Discharge Condition: Stable  Diet recommendation: Heart healthy/carb  modified  Filed Weights   04/18/20 1300  Weight: 81.6 kg    History of present illness:  Andrea Kirk was admitted to the hospital with a working diagnosis of acute ischemic CVA at the left temporal cortex and  punctate acuteorsubacute infarcts in the left parietal and right occipital cortex/ cardioembolic.  80 year old WF PMHx CAD, DM type II, and dyslipidemia   Presents with altered mentation and mechanical falls. She lives in assisted living, she was found down, confused and weak. She was transported to the hospital for further evaluation. On her initial physical examination blood pressure 156/75, heart rate 86, respiratory rate 15, temperature 98.9, oxygen saturation 92%. Lungs are clear to auscultation bilaterally, heart S1-S2, present rhythmic, soft abdomen, lower extremity edema. Her strength was preserved.  Sodium 138, potassium 5.3, chloride 101, bicarb 28, BUN 38, creatinine 1.42, white cell count 6.7, hemoglobin 11.8, hematocrit 36.8, platelets 165. SARS COVID-19 negative. Urinalysis specific gravity 1.043.  Head CT no acute findings, small remote left cerebellar infarct. Brain MRI withsmallacute infarct in the left temporal cortex, punctate acute or subacute infarcts in the left parietal and right occipital cortex.  Chest radiograph with cardiomegaly. EKG 87 bpm, normal axis, normal intervals, sinus rhythm, Q-wave lead I-aVL, Q waves lead II, no significant ST segment or T wave changes.  Patient has been placed on dual antiplatelet therapy, slowly improving symptoms, but not yet back to baseline, plan to transfer to SNF. Out patient loop recorder per EP.  Hospital Course:  See above  Procedures: 4/10 echocardiogram:Left Ventricle: LVEF= 50 to 55%.  -Moderate left ventricular hypertrophy.  -Grade II diastolic dysfunction (pseudonormalization). Elevated left ventricular end-diastolic  pressure.  -Right Ventricle: severely elevated pulmonary artery systolic pressure. estimated right ventricular systolic pressure is 80.9 mmHg.  -Mitral Valve: Moderate mitral valve regurgitation.  -Tricuspid Valve: regurgitation is mild to moderate.  -Aorta: moderate dilatation of the  ascending aorta, measuring 42 mm.  -Venous: The inferior vena cava is dilated in size with less than 50%  respiratory variability, suggesting right atrial pressure of 15 mmHg.  4/10 MRI brain W. Wo contrast: Small acute infarct in the left temporal cortex. Punctate acute or subacute infarcts in the left parietal and right occipital cortex. 4/10 CT cerebral perfusion W contrast: No emergent finding. No perfusion deficit. -80% atheromatous narrowing at the proximal left subclavian artery. High-grade bilateral vertebral origin stenosis. High-grade left V4 segment stenosis. -Carotid endarterectomy on both sides. No proximal flow limiting stenosis in the anterior circulation. -High-grade right P1 segment stenosis. 4/10 CTA neck W or WO contrast;No emergent finding. No perfusion deficit. -80% atheromatous narrowing at the proximal left subclavian artery. High-grade bilateral vertebral origin stenosis. High-grade left V4 segment stenosis. -Carotid endarterectomy on both sides. No proximal flow limiting stenosis in the anterior circulation. -High-grade right P1 segment stenosis. 4/10 CT C-spine;Degenerative changes in the cervical spine without evidence for an acute fracture. -. Loss of cervical lordosis.  4/10 CT head code stroke;No acute finding. -Small remote left cerebellar infarct.  Ventilator settings: Nasal cannula 4/14 Flow 2 L/min SPO2 100%  Consultations: Stroke team EP   Discharge Exam: Vitals:   04/20/20 0016 04/20/20 0400 04/20/20 0809 04/20/20 1219  BP: 131/65 112/72 137/66 133/70  Pulse: 63 (!) 59 61 65  Resp: 18 18 14 15   Temp: 98.5 F (36.9 C) 98.5 F (36.9 C) 98.8 F (37.1 C) 98.8 F (37.1 C)  TempSrc: Oral Oral Oral Oral  SpO2: 98% 98% 100% 99%  Weight:  Height:       General: A/O x4, positive acute respiratory distress Eyes: negative scleral hemorrhage, negative anisocoria, negative icterus ENT: Negative Runny nose, negative gingival bleeding, Neck:   Negative scars, masses, torticollis, lymphadenopathy, JVD Lungs: Clear to auscultation bilaterally without wheezes or crackles Cardiovascular: Regular rate and rhythm without murmur gallop or rub normal S1 and S2 Abdomen: negative abdominal pain, nondistended, positive soft, bowel sounds, no rebound, no ascites, no appreciable mass Extremities: No significant cyanosis, clubbing, or edema bilateral lower extremities Skin: Negative rashes, lesions, ulcers Psychiatric:  Negative depression, negative anxiety, negative fatigue, negative mania  Central nervous system:  Cranial nerves II through XII intact, tongue/uvula midline, all extremities muscle strength 5/5, sensation intact throughout, positive mild dysarthria, negative expressive aphasia, negative receptive aphasia.   Discharge Instructions   Allergies as of 04/20/2020      Reactions   Codeine Other (See Comments)   Abnormal behavior Other reaction(s): Unknown   Latex Other (See Comments)   tears skin Other reaction(s): Rash   Morphine And Related Other (See Comments)   Affects BP and blood sugar.   Cyclobenzaprine    MADE PT SICK- pt unsure if med was cyclobenzaprine or methocarbamol    Methocarbamol Other (See Comments)   MADE PT SICK- pt unsure if med was cyclobenzaprine or methocarbamol    Morphine    Other reaction(s): Unknown      Medication List    STOP taking these medications   amoxicillin 500 MG capsule Commonly known as: AMOXIL   CALTRATE 600+D PO   cholecalciferol 25 MCG (1000 UNIT) tablet Commonly known as: VITAMIN D3   clopidogrel 75 MG tablet Commonly known as: PLAVIX   fish oil-omega-3 fatty acids 1000 MG capsule   furosemide 20 MG tablet Commonly known as: LASIX   lisinopril 5 MG tablet Commonly known as: ZESTRIL   methocarbamol 500 MG tablet Commonly known as: ROBAXIN     TAKE these medications   acetaminophen 325 MG tablet Commonly known as: TYLENOL Take 650 mg by mouth every 6 (six)  hours as needed for moderate pain.   amLODipine 5 MG tablet Commonly known as: NORVASC Take 1 tablet (5 mg total) by mouth daily.   aspirin EC 81 MG tablet Take 81 mg by mouth every evening.   B-12 2500 MCG Tabs Take 2,500 mcg by mouth daily.   carvedilol 25 MG tablet Commonly known as: COREG TAKE ONE TABLET BY MOUTH TWICE A DAY What changed: when to take this   citalopram 20 MG tablet Commonly known as: CELEXA Take 20 mg by mouth every morning.   FERROUS SULFATE PO Take 325 mg by mouth daily.   gabapentin 300 MG capsule Commonly known as: NEURONTIN Take 300 mg by mouth 4 (four) times daily.   glipiZIDE 5 MG 24 hr tablet Commonly known as: GLUCOTROL XL Take 1 tablet (5 mg total) by mouth daily with breakfast. Start taking on: April 21, 2020 What changed:   medication strength  how much to take   HYDROcodone-acetaminophen 10-325 MG tablet Commonly known as: NORCO Take 1 tablet by mouth 2 (two) times daily as needed for severe pain.   Magnesium Oxide 250 MG Tabs Take 250 mg by mouth daily.   meclizine 25 MG tablet Commonly known as: ANTIVERT Take 25 mg by mouth 2 (two) times daily as needed for dizziness. For dizziness   nitroGLYCERIN 0.4 MG SL tablet Commonly known as: NITROSTAT Place 1 tablet (0.4 mg total) under the tongue every 5 (  five) minutes as needed. Chest pain.   pantoprazole 40 MG tablet Commonly known as: PROTONIX Take 40 mg by mouth daily as needed (INDIGESTION).   promethazine 12.5 MG tablet Commonly known as: PHENERGAN Take 12.5 mg by mouth every 6 (six) hours as needed for nausea or vomiting.   rosuvastatin 5 MG tablet Commonly known as: CRESTOR Take 5 mg by mouth daily. Takes on Monday and Friday   ticagrelor 90 MG Tabs tablet Commonly known as: BRILINTA Take 1 tablet (90 mg total) by mouth 2 (two) times daily.   vitamin C 1000 MG tablet Take 1,000 mg by mouth daily.            Durable Medical Equipment  (From admission,  onward)         Start     Ordered   04/19/20 1829  For home use only DME oxygen  Once       Comments: SATURATION QUALIFICATIONS: (This note is used to comply with regulatory documentation for home oxygen) Patient Saturations on Room Air at Rest = 90% Patient Saturations on Room Air while Ambulating = 86% Patient Saturations on 2 Liters of oxygen while Ambulating = 93% Please briefly explain why patient needs home oxygen: Patient requires supplemental oxygen to maintain oxygen saturations at acceptable, safe levels with physical activity. -2 L O2 via [place] titrate to maintain SPO2> 92% -Provide [employer]ogen portable home O2 concentrator  Question Answer Comment  Length of Need Lifetime   Mode or (Route) Nasal cannula   Frequency Continuous (stationary and portable oxygen unit needed)   Oxygen conserving device Yes   Oxygen delivery system Gas      04/19/20 1829         Allergies  Allergen Reactions  . Codeine Other (See Comments)    Abnormal behavior Other reaction(s): Unknown  . Latex Other (See Comments)    tears skin Other reaction(s): Rash  . Morphine And Related Other (See Comments)    Affects BP and blood sugar.  . Cyclobenzaprine     MADE PT SICK- pt unsure if med was cyclobenzaprine or methocarbamol   . Methocarbamol Other (See Comments)    MADE PT SICK- pt unsure if med was cyclobenzaprine or methocarbamol   . Morphine     Other reaction(s): Unknown    Contact information for follow-up providers    Vickie Epley, MD Follow up.   Specialties: Cardiology, Radiology Why: 05/09/2020 @ 4:00PM, re-discuss heart rhythm monitoring Contact information: [place] [address] [place] [address] [phone]            Contact information for after-discharge care    Destination    [facility] .   Service: Skilled Nursing Contact information: [place] [phone]                    The results of significant diagnostics from this hospitalization (including imaging, microbiology, ancillary and laboratory) are listed below for reference.    Significant Diagnostic Studies: CT ANGIO HEAD W OR WO CONTRAST  Result Date: 04/16/2020 CLINICAL DATA:  Stroke suspected EXAM: CT ANGIOGRAPHY HEAD AND NECK CT PERFUSION BRAIN TECHNIQUE: Multidetector CT imaging of the head and neck was performed using the standard protocol during bolus administration of intravenous contrast. Multiplanar CT image reconstructions and MIPs were obtained to evaluate the vascular anatomy. Carotid stenosis measurements (when applicable) are obtained utilizing NASCET criteria, using the distal internal carotid diameter as the  denominator. Multiphase CT imaging of the brain was performed following IV bolus contrast injection. Subsequent parametric perfusion maps were calculated using RAPID software. CONTRAST:  Dose is currently not known COMPARISON:  Noncontrast head CT from earlier today FINDINGS: CTA NECK FINDINGS Aortic arch: Atheromatous plaque.  Three vessel branching. Right carotid system: Partial retropharyngeal course. Suspect carotid endarterectomy. No stenosis or ulceration. Left carotid system: Partial retropharyngeal course of the ICAs. Suspect endarterectomy. No focal or flow limiting stenosis. Vertebral arteries: Proximal left subclavian atherosclerosis with stenosis exacerbated by kink. Luminal narrowing is 80% as measured on coronal reformats. Calcified plaque at the bilateral vertebral origin with high-grade stenoses. No dissection. Skeleton: No acute finding Other neck: Postoperative changes in the bilateral neck. No acute finding. Upper chest: No acute finding Review of the MIP images confirms the above findings CTA HEAD FINDINGS Anterior circulation: Heavily calcified bilateral supraclinoid ICA which limits luminal visualization due to calcified plaque blooming. No flow limiting stenosis by  measurement. Overall mild atheromatous irregularity of branches without branch occlusion or proximal focal stenosis. Negative for aneurysm Posterior circulation: Extensive atheromatous plaque to the bilateral V4 segments with high-grade narrowing on the left, pre occlusive. The basilar is smooth and widely patent. High-grade right P1 segment stenosis. No PCA branch occlusion or asymmetric flow. Venous sinuses: Unremarkable for contrast time Anatomic variants: None significant Review of the MIP images confirms the above findings CT Brain Perfusion Findings: ASPECTS: 10 CBF (<30%) Volume: 51mL Perfusion (Tmax>6.0s) volume: 14mL IMPRESSION: 1. No emergent finding.  No perfusion deficit. 2. 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 High-grade bilateral vertebral origin stenosis. High-grade left V4 segment stenosis. 3. Carotid endarterectomy on both sides. No proximal flow limiting stenosis in the anterior circulation. 4. High-grade right P1 segment stenosis. Electronically Signed   By: Monte Fantasia M.D.   On: 04/16/2020 08:36   CT ANGIO NECK W OR WO CONTRAST  Result Date: 04/16/2020 CLINICAL DATA:  Stroke suspected EXAM: CT ANGIOGRAPHY HEAD AND NECK CT PERFUSION BRAIN TECHNIQUE: Multidetector CT imaging of the head and neck was performed using the standard protocol during bolus administration of intravenous contrast. Multiplanar CT image reconstructions and MIPs were obtained to evaluate the vascular anatomy. Carotid stenosis measurements (when applicable) are obtained utilizing NASCET criteria, using the distal internal carotid diameter as the denominator. Multiphase CT imaging of the brain was performed following IV bolus contrast injection. Subsequent parametric perfusion maps were calculated using RAPID software. CONTRAST:  Dose is currently not known COMPARISON:  Noncontrast head CT from earlier today FINDINGS: CTA NECK FINDINGS Aortic arch: Atheromatous plaque.  Three vessel  branching. Right carotid system: Partial retropharyngeal course. Suspect carotid endarterectomy. No stenosis or ulceration. Left carotid system: Partial retropharyngeal course of the ICAs. Suspect endarterectomy. No focal or flow limiting stenosis. Vertebral arteries: Proximal left subclavian atherosclerosis with stenosis exacerbated by kink. Luminal narrowing is 80% as measured on coronal reformats. Calcified plaque at the bilateral vertebral origin with high-grade stenoses. No dissection. Skeleton: No acute finding Other neck: Postoperative changes in the bilateral neck. No acute finding. Upper chest: No acute finding Review of the MIP images confirms the above findings CTA HEAD FINDINGS Anterior circulation: Heavily calcified bilateral supraclinoid ICA which limits luminal visualization due to calcified plaque blooming. No flow limiting stenosis by measurement. Overall mild atheromatous irregularity of branches without branch occlusion or proximal focal stenosis. Negative for aneurysm Posterior circulation: Extensive atheromatous plaque to the bilateral V4 segments with high-grade narrowing on the left, pre occlusive. The basilar is  smooth and widely patent. High-grade right P1 segment stenosis. No PCA branch occlusion or asymmetric flow. Venous sinuses: Unremarkable for contrast time Anatomic variants: None significant Review of the MIP images confirms the above findings CT Brain Perfusion Findings: ASPECTS: 10 CBF (<30%) Volume: 17mL Perfusion (Tmax>6.0s) volume: 62mL IMPRESSION: 1. No emergent finding.  No perfusion deficit. 2. 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 High-grade bilateral vertebral origin stenosis. High-grade left V4 segment stenosis. 3. Carotid endarterectomy on both sides. No proximal flow limiting stenosis in the anterior circulation. 4. High-grade right P1 segment stenosis. Electronically Signed   By: Monte Fantasia M.D.   On: 04/16/2020 08:36   MR BRAIN WO  CONTRAST  Result Date: 04/16/2020 CLINICAL DATA:  Fall this morning with head injury EXAM: MRI HEAD WITHOUT CONTRAST TECHNIQUE: Multiplanar, multiecho pulse sequences of the brain and surrounding structures were obtained without intravenous contrast. COMPARISON:  Head CT and CTA from earlier today FINDINGS: Brain: Subcentimeter acute infarct in the lateral left temporal cortex. Punctate acute or subacute infarct along the left parietal cortex. On coronal diffusion there is an acute or subacute infarct in the right occipital white matter. No evidence of hemorrhage, hydrocephalus, collection, or mass. Only diffusion and sagittal T1 weighted images were acquired due to claustrophobia. T1 weighted imaging is motion degraded Vascular: No findings on the above sequences Skull and upper cervical spine: No evidence of focal lesion. Posterior scalp swelling. IMPRESSION: 1. Small acute infarct in the left temporal cortex. Punctate acute or subacute infarcts in the left parietal and right occipital cortex. 2. Truncated study due to claustrophobia. Electronically Signed   By: Monte Fantasia M.D.   On: 04/16/2020 09:50   CT C-SPINE NO CHARGE  Result Date: 04/16/2020 CLINICAL DATA:  Fall. EXAM: CT CERVICAL SPINE WITHOUT CONTRAST TECHNIQUE: Multidetector CT imaging of the cervical spine was performed without intravenous contrast. Multiplanar CT image reconstructions were also generated. COMPARISON:  None. FINDINGS: Alignment: Straightening of normal cervical lordosis without subluxation. Skull base and vertebrae: No acute fracture. No primary bone lesion or focal pathologic process. Soft tissues and spinal canal: No prevertebral fluid or swelling. No visible canal hematoma. Disc levels: Loss of disc height noted at all levels from C3-4 to C7-T1 with associated endplate degeneration. Facets are well aligned bilaterally. Upper chest: Unremarkable. Other: None. IMPRESSION: 1. Degenerative changes in the cervical spine without  evidence for an acute fracture. 2. Loss of cervical lordosis. This can be related to patient positioning, muscle spasm or soft tissue injury. Electronically Signed   By: Misty Stanley M.D.   On: 04/16/2020 09:13   CT CEREBRAL PERFUSION W CONTRAST  Result Date: 04/16/2020 CLINICAL DATA:  Stroke suspected EXAM: CT ANGIOGRAPHY HEAD AND NECK CT PERFUSION BRAIN TECHNIQUE: Multidetector CT imaging of the head and neck was performed using the standard protocol during bolus administration of intravenous contrast. Multiplanar CT image reconstructions and MIPs were obtained to evaluate the vascular anatomy. Carotid stenosis measurements (when applicable) are obtained utilizing NASCET criteria, using the distal internal carotid diameter as the denominator. Multiphase CT imaging of the brain was performed following IV bolus contrast injection. Subsequent parametric perfusion maps were calculated using RAPID software. CONTRAST:  Dose is currently not known COMPARISON:  Noncontrast head CT from earlier today FINDINGS: CTA NECK FINDINGS Aortic arch: Atheromatous plaque.  Three vessel branching. Right carotid system: Partial retropharyngeal course. Suspect carotid endarterectomy. No stenosis or ulceration. Left carotid system: Partial retropharyngeal course of the ICAs. Suspect endarterectomy. No focal or flow  limiting stenosis. Vertebral arteries: Proximal left subclavian atherosclerosis with stenosis exacerbated by kink. Luminal narrowing is 80% as measured on coronal reformats. Calcified plaque at the bilateral vertebral origin with high-grade stenoses. No dissection. Skeleton: No acute finding Other neck: Postoperative changes in the bilateral neck. No acute finding. Upper chest: No acute finding Review of the MIP images confirms the above findings CTA HEAD FINDINGS Anterior circulation: Heavily calcified bilateral supraclinoid ICA which limits luminal visualization due to calcified plaque blooming. No flow limiting  stenosis by measurement. Overall mild atheromatous irregularity of branches without branch occlusion or proximal focal stenosis. Negative for aneurysm Posterior circulation: Extensive atheromatous plaque to the bilateral V4 segments with high-grade narrowing on the left, pre occlusive. The basilar is smooth and widely patent. High-grade right P1 segment stenosis. No PCA branch occlusion or asymmetric flow. Venous sinuses: Unremarkable for contrast time Anatomic variants: None significant Review of the MIP images confirms the above findings CT Brain Perfusion Findings: ASPECTS: 10 CBF (<30%) Volume: 35mL Perfusion (Tmax>6.0s) volume: 80mL IMPRESSION: 1. No emergent finding.  No perfusion deficit. 2. 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 High-grade bilateral vertebral origin stenosis. High-grade left V4 segment stenosis. 3. Carotid endarterectomy on both sides. No proximal flow limiting stenosis in the anterior circulation. 4. High-grade right P1 segment stenosis. Electronically Signed   By: Monte Fantasia M.D.   On: 04/16/2020 08:36   DG Chest Port 1 View  Result Date: 04/16/2020 CLINICAL DATA:  Encephalopathy with fall EXAM: PORTABLE CHEST 1 VIEW COMPARISON:  Jun 07, 2019 FINDINGS: There is no edema or airspace opacity. There is cardiomegaly with pulmonary vascularity within normal limits. The patient is status post coronary artery bypass grafting. No adenopathy evident. There is aortic atherosclerosis. Bones appear osteoporotic. IMPRESSION: No edema or airspace opacity. Cardiomegaly with postoperative changes. Aortic Atherosclerosis (ICD10-I70.0). Osteoporosis. Electronically Signed   By: Lowella Grip III M.D.   On: 04/16/2020 10:29   DG Foot Complete Left  Result Date: 04/16/2020 CLINICAL DATA:  Pain following fall EXAM: LEFT FOOT - COMPLETE 3+ VIEW COMPARISON:  September 07, 2015 FINDINGS: Frontal, oblique, and lateral views were obtained. Bones are osteoporotic. No acute  fracture or dislocation. Stable bony overgrowth along the lateral aspect of the proximal first metatarsal likely residua of prior trauma. There is hallux valgus deformity at the first MTP joint. There is mild narrowing of all PIP and D IP joints. No erosive changes. There is a small inferior calcaneal spur. IMPRESSION: Osteoporosis. No acute fracture or dislocation evident. Suspect prior trauma with bony remodeling along the lateral proximal aspect first metatarsal, stable. Hallux valgus deformity first MTP joint. Narrowing multiple distal joints. Small inferior calcaneal spur. Electronically Signed   By: Lowella Grip III M.D.   On: 04/16/2020 10:31   ECHOCARDIOGRAM COMPLETE  Result Date: 04/16/2020    ECHOCARDIOGRAM REPORT   Patient Name:   Andrea Kirk Date of Exam: 04/16/2020 Medical Rec #:  [id number]     Height:       65.0 in Accession #:    [id number]    Weight:       178.0 lb Date of Birth:  Nov 25, 1940      BSA:          1.883 m Patient Age:    19 years      BP:           146/89 mmHg Patient Gender: F             HR:  75 bpm. Exam Location:  Inpatient Procedure: 2D Echo, 3D Echo, Cardiac Doppler and Color Doppler Indications:    Stroke  History:        Patient has prior history of Echocardiogram examinations, most                 recent 12/16/2017. CHF, CAD; Risk Factors:Hypertension and                 Diabetes. Hypoxia.  Sonographer:    Roseanna Rainbow RDCS Referring Phys: [phone] RACHAL A DAVID  Sonographer Comments: Technically difficult study due to poor echo windows. Image acquisition challenging due to patient body habitus. Patient in pain, notified nursing staff. IMPRESSIONS  1. Left ventricular ejection fraction, by estimation, is 50 to 55%. Left ventricular ejection fraction by 3D volume is 51 %. The left ventricle has low normal function. The left ventricle has no regional wall motion abnormalities. There is moderate left  ventricular hypertrophy. Left ventricular diastolic parameters are  consistent with Grade II diastolic dysfunction (pseudonormalization). Elevated left ventricular end-diastolic pressure.  2. Right ventricular systolic function is mildly reduced. The right ventricular size is mildly enlarged. There is severely elevated pulmonary artery systolic pressure. The estimated right ventricular systolic pressure is 56.3 mmHg.  3. The mitral valve is grossly normal. Moderate mitral valve regurgitation.  4. Tricuspid valve regurgitation is mild to moderate.  5. The aortic valve is grossly normal. Aortic valve regurgitation is not visualized. No aortic stenosis is present.  6. Aortic dilatation noted. There is moderate dilatation of the ascending aorta, measuring 42 mm.  7. The inferior vena cava is dilated in size with <50% respiratory variability, suggesting right atrial pressure of 15 mmHg. FINDINGS  Left Ventricle: Left ventricular ejection fraction, by estimation, is 50 to 55%. Left ventricular ejection fraction by 3D volume is 51 %. The left ventricle has low normal function. The left ventricle has no regional wall motion abnormalities. The left ventricular internal cavity size was normal in size. There is moderate left ventricular hypertrophy. Left ventricular diastolic parameters are consistent with Grade II diastolic dysfunction (pseudonormalization). Elevated left ventricular end-diastolic pressure. Right Ventricle: The right ventricular size is mildly enlarged. Right vetricular wall thickness was not well visualized. Right ventricular systolic function is mildly reduced. There is severely elevated pulmonary artery systolic pressure. The tricuspid regurgitant velocity is 3.81 m/s, and with an assumed right atrial pressure of 15 mmHg, the estimated right ventricular systolic pressure is 14.9 mmHg. Left Atrium: Left atrial size was normal in size. Right Atrium: Right atrial size was normal in size. Pericardium: There is no evidence of pericardial effusion. Mitral Valve: The mitral valve  is grossly normal. Moderate mitral valve regurgitation. MV peak gradient, 10.0 mmHg. The mean mitral valve gradient is 3.0 mmHg. Tricuspid Valve: The tricuspid valve is grossly normal. Tricuspid valve regurgitation is mild to moderate. Aortic Valve: The aortic valve is grossly normal. Aortic valve regurgitation is not visualized. No aortic stenosis is present. Pulmonic Valve: The pulmonic valve was grossly normal. Pulmonic valve regurgitation is not visualized. Aorta: Aortic dilatation noted. There is moderate dilatation of the ascending aorta, measuring 42 mm. Venous: The inferior vena cava is dilated in size with less than 50% respiratory variability, suggesting right atrial pressure of 15 mmHg. IAS/Shunts: The atrial septum is grossly normal.  LEFT VENTRICLE PLAX 2D LVIDd:         4.10 cm         Diastology LVIDs:  2.30 cm         LV e' medial:    5.10 cm/s LV PW:         1.50 cm         LV E/e' medial:  28.2 LV IVS:        1.40 cm         LV e' lateral:   7.13 cm/s LVOT diam:     1.70 cm         LV E/e' lateral: 20.2 LV SV:         55 LV SV Index:   29 LVOT Area:     2.27 cm        3D Volume EF                                LV 3D EF:    Left                                             ventricular LV Volumes (MOD)                            ejection LV vol d, MOD    77.3 ml                    fraction by A2C:                                        3D volume LV vol d, MOD    48.1 ml                    is 51 %. A4C: LV vol s, MOD    27.6 ml A2C:                           3D Volume EF: LV vol s, MOD    17.6 ml       3D EF:        51 % A4C: LV SV MOD A2C:   49.7 ml LV SV MOD A4C:   48.1 ml LV SV MOD BP:    42.3 ml RIGHT VENTRICLE            IVC RV S prime:     7.20 cm/s  IVC diam: 2.30 cm TAPSE (M-mode): 1.5 cm LEFT ATRIUM             Index       RIGHT ATRIUM           Index LA diam:        4.60 cm 2.44 cm/m  RA Area:     17.80 cm LA Vol (A2C):   80.1 ml 42.55 ml/m RA Volume:   51.20 ml  27.20 ml/m LA  Vol (A4C):   44.5 ml 23.64 ml/m LA Biplane Vol: 62.8 ml 33.36 ml/m  AORTIC VALVE LVOT Vmax:   126.00 cm/s LVOT Vmean:  90.200 cm/s LVOT VTI:    0.241 m  AORTA Ao Root diam: 3.00 cm Ao Asc diam:  4.20 cm MITRAL VALVE  TRICUSPID VALVE MV Area (PHT): 5.75 cm      TR Peak grad:   58.1 mmHg MV Area VTI:   1.59 cm      TR Vmax:        381.00 cm/s MV Peak grad:  10.0 mmHg MV Mean grad:  3.0 mmHg      SHUNTS MV Vmax:       1.58 m/s      Systemic VTI:  0.24 m MV Vmean:      71.8 cm/s     Systemic Diam: 1.70 cm MV Decel Time: 132 msec MR Peak grad:    112.8 mmHg MR Mean grad:    87.0 mmHg MR Vmax:         531.00 cm/s MR Vmean:        445.0 cm/s MR PISA:         1.90 cm MR PISA Eff ROA: 11 mm MR PISA Radius:  0.55 cm MV E velocity: 144.00 cm/s MV A velocity: 56.30 cm/s MV E/A ratio:  2.56 Mertie Moores MD Electronically signed by Mertie Moores MD Signature Date/Time: 04/16/2020/4:39:42 PM    Final    CT HEAD CODE STROKE WO CONTRAST  Result Date: 04/16/2020 CLINICAL DATA:  Code stroke.  Slurred speech EXAM: CT HEAD WITHOUT CONTRAST TECHNIQUE: Contiguous axial images were obtained from the base of the skull through the vertex without intravenous contrast. COMPARISON:  06/07/2019 FINDINGS: Brain: No evidence of acute infarction, hemorrhage, hydrocephalus, extra-axial collection or mass lesion/mass effect. Small remote left cerebellar infarct. Vascular: No hyperdense vessel or unexpected calcification. Skull: Normal. Negative for fracture or focal lesion. Sinuses/Orbits: No acute finding.  Bilateral cataract resection Other: These results were communicated to Dr. Rory Percy at 8:17 amon 4/10/2022by text page via the [facility] messaging system. ASPECTS [facility] Stroke Program Early CT Score) - Ganglionic level infarction (caudate, lentiform nuclei, internal capsule, insula, M1-M3 cortex): 7 - Supraganglionic infarction (M4-M6 cortex): 3 Total score (0-10 with 10 being normal): 10 IMPRESSION: 1. No acute finding. 2.  Small remote left cerebellar infarct. Electronically Signed   By: Monte Fantasia M.D.   On: 04/16/2020 08:18    Microbiology: Recent Results (from the past 240 hour(s))  Resp Panel by RT-PCR (Flu A&B, Covid) Nasopharyngeal Swab     Status: None   Collection Time: 04/16/20 11:00 AM   Specimen: Nasopharyngeal Swab; Nasopharyngeal(NP) swabs in vial transport medium  Result Value Ref Range Status   SARS Coronavirus 2 by RT PCR NEGATIVE NEGATIVE Final    Comment: (NOTE) SARS-CoV-2 target nucleic acids are NOT DETECTED.  The SARS-CoV-2 RNA is generally detectable in upper respiratory specimens during the acute phase of infection. The lowest concentration of SARS-CoV-2 viral copies this assay can detect is 138 copies/mL. A negative result does not preclude SARS-Cov-2 infection and should not be used as the sole basis for treatment or other patient management decisions. A negative result may occur with  improper specimen collection/handling, submission of specimen other than nasopharyngeal swab, presence of viral mutation(s) within the areas targeted by this assay, and inadequate number of viral copies(<138 copies/mL). A negative result must be combined with clinical observations, patient history, and epidemiological information. The expected result is Negative.  Fact Sheet for Patients:  EntrepreneurPulse.com.au  Fact Sheet for Healthcare Providers:  IncredibleEmployment.be  This test is no t yet approved or cleared by the [place] FDA and  has been authorized for detection and/or diagnosis of SARS-CoV-2 by FDA under an Emergency Use Authorization (EUA). This EUA  will remain  in effect (meaning this test can be used) for the duration of the COVID-19 declaration under Section 564(b)(1) of the Act, 21 U.S.C.section 360bbb-3(b)(1), unless the authorization is terminated  or revoked sooner.       Influenza A by PCR NEGATIVE NEGATIVE Final    Influenza B by PCR NEGATIVE NEGATIVE Final    Comment: (NOTE) The Xpert Xpress SARS-CoV-2/FLU/RSV plus assay is intended as an aid in the diagnosis of influenza from Nasopharyngeal swab specimens and should not be used as a sole basis for treatment. Nasal washings and aspirates are unacceptable for Xpert Xpress SARS-CoV-2/FLU/RSV testing.  Fact Sheet for Patients: EntrepreneurPulse.com.au  Fact Sheet for Healthcare Providers: IncredibleEmployment.be  This test is not yet approved or cleared by the [place] FDA and has been authorized for detection and/or diagnosis of SARS-CoV-2 by FDA under an Emergency Use Authorization (EUA). This EUA will remain in effect (meaning this test can be used) for the duration of the COVID-19 declaration under Section 564(b)(1) of the Act, 21 U.S.C. section 360bbb-3(b)(1), unless the authorization is terminated or revoked.  Performed at [facility], [place] [address], [place], [place] [address]   SARS CORONAVIRUS 2 (TAT 6-24 HRS) Nasopharyngeal Nasopharyngeal Swab     Status: None   Collection Time: 04/19/20  6:40 PM   Specimen: Nasopharyngeal Swab  Result Value Ref Range Status   SARS Coronavirus 2 NEGATIVE NEGATIVE Final    Comment: (NOTE) SARS-CoV-2 target nucleic acids are NOT DETECTED.  The SARS-CoV-2 RNA is generally detectable in upper and lower respiratory specimens during the acute phase of infection. Negative results do not preclude SARS-CoV-2 infection, do not rule out co-infections with other pathogens, and should not be used as the sole basis for treatment or other patient management decisions. Negative results must be combined with clinical observations, patient history, and epidemiological information. The expected result is Negative.  Fact Sheet for Patients: SugarRoll.be  Fact Sheet for Healthcare  Providers: https://www.Januel Doolan-mathews.com/  This test is not yet approved or cleared by the [place] FDA and  has been authorized for detection and/or diagnosis of SARS-CoV-2 by FDA under an Emergency Use Authorization (EUA). This EUA will remain  in effect (meaning this test can be used) for the duration of the COVID-19 declaration under Se ction 564(b)(1) of the Act, 21 U.S.C. section 360bbb-3(b)(1), unless the authorization is terminated or revoked sooner.  Performed at [facility], [place] [address] [place], [place] [address]      Labs: Basic Metabolic Panel: Recent Labs  Lab 04/16/20 0802 04/16/20 0806 04/18/20 0149 04/19/20 1000 04/20/20 0149  NA 138 139 137 138 136  K 5.3* 5.2* 4.4 4.8 5.3*  CL 101 101 101 99 100  CO2 28  --  31 32 31  GLUCOSE 230* 210* 220* 235* 162*  BUN 38* 40* 15 18 24*  CREATININE 1.42* 1.30* 0.69 0.74 0.95  CALCIUM 9.8  --  8.7* 9.2 9.2  MG  --   --   --  1.7 1.8  PHOS  --   --   --  2.6 2.9   Liver Function Tests: Recent Labs  Lab 04/16/20 0802 04/19/20 1000 04/20/20 0149  AST 73* 33 24  ALT 72* 57* 46*  ALKPHOS 49 47 45  BILITOT 1.2 1.7* 1.4*  PROT 6.7 6.3* 6.3*  ALBUMIN 3.7 3.0* 3.0*   No results for input(s): LIPASE, AMYLASE in the last 168 hours. Recent Labs  Lab 04/16/20 1056  AMMONIA  48*   CBC: Recent Labs  Lab 04/16/20 0802 04/16/20 0806 04/19/20 1000  WBC 6.7  --  4.5  NEUTROABS 4.5  --  3.2  HGB 11.8* 12.6 10.5*  HCT 36.8 37.0 32.7*  MCV 123.1*  --  124.3*  PLT 165  --  139*   Cardiac Enzymes: No results for input(s): CKTOTAL, CKMB, CKMBINDEX, TROPONINI in the last 168 hours. BNP: BNP (last 3 results) No results for input(s): BNP in the last 8760 hours.  ProBNP (last 3 results) No results for input(s): PROBNP in the last 8760 hours.  CBG: Recent Labs  Lab 04/19/20 2025 04/20/20 0011 04/20/20 0420 04/20/20 0910 04/20/20 1226  GLUCAP 230* 162* 157* 208* 260*        Signed:  Dia Crawford, MD [facility]

## 2020-04-21 DIAGNOSIS — N183 Chronic kidney disease, stage 3 unspecified: Secondary | ICD-10-CM | POA: Diagnosis not present

## 2020-04-21 DIAGNOSIS — I639 Cerebral infarction, unspecified: Secondary | ICD-10-CM | POA: Diagnosis not present

## 2020-04-21 DIAGNOSIS — I5030 Unspecified diastolic (congestive) heart failure: Secondary | ICD-10-CM | POA: Diagnosis not present

## 2020-04-21 DIAGNOSIS — I272 Pulmonary hypertension, unspecified: Secondary | ICD-10-CM | POA: Diagnosis not present

## 2020-04-21 DIAGNOSIS — E1122 Type 2 diabetes mellitus with diabetic chronic kidney disease: Secondary | ICD-10-CM | POA: Diagnosis not present

## 2020-05-03 DIAGNOSIS — I639 Cerebral infarction, unspecified: Secondary | ICD-10-CM | POA: Diagnosis not present

## 2020-05-03 DIAGNOSIS — E119 Type 2 diabetes mellitus without complications: Secondary | ICD-10-CM | POA: Diagnosis not present

## 2020-05-03 DIAGNOSIS — G629 Polyneuropathy, unspecified: Secondary | ICD-10-CM | POA: Diagnosis not present

## 2020-05-03 DIAGNOSIS — F32A Depression, unspecified: Secondary | ICD-10-CM | POA: Diagnosis not present

## 2020-05-03 DIAGNOSIS — E785 Hyperlipidemia, unspecified: Secondary | ICD-10-CM | POA: Diagnosis not present

## 2020-05-03 DIAGNOSIS — I2510 Atherosclerotic heart disease of native coronary artery without angina pectoris: Secondary | ICD-10-CM | POA: Diagnosis not present

## 2020-05-03 DIAGNOSIS — J9601 Acute respiratory failure with hypoxia: Secondary | ICD-10-CM | POA: Diagnosis not present

## 2020-05-03 DIAGNOSIS — D649 Anemia, unspecified: Secondary | ICD-10-CM | POA: Diagnosis not present

## 2020-05-03 DIAGNOSIS — I10 Essential (primary) hypertension: Secondary | ICD-10-CM | POA: Diagnosis not present

## 2020-05-03 DIAGNOSIS — D519 Vitamin B12 deficiency anemia, unspecified: Secondary | ICD-10-CM | POA: Diagnosis not present

## 2020-05-03 DIAGNOSIS — M549 Dorsalgia, unspecified: Secondary | ICD-10-CM | POA: Diagnosis not present

## 2020-05-03 DIAGNOSIS — I5032 Chronic diastolic (congestive) heart failure: Secondary | ICD-10-CM | POA: Diagnosis not present

## 2020-05-05 DIAGNOSIS — I10 Essential (primary) hypertension: Secondary | ICD-10-CM | POA: Diagnosis not present

## 2020-05-05 DIAGNOSIS — I5032 Chronic diastolic (congestive) heart failure: Secondary | ICD-10-CM | POA: Diagnosis not present

## 2020-05-05 DIAGNOSIS — E119 Type 2 diabetes mellitus without complications: Secondary | ICD-10-CM | POA: Diagnosis not present

## 2020-05-05 DIAGNOSIS — G629 Polyneuropathy, unspecified: Secondary | ICD-10-CM | POA: Diagnosis not present

## 2020-05-09 ENCOUNTER — Ambulatory Visit: Payer: Medicare Other | Admitting: Cardiology

## 2020-05-09 DIAGNOSIS — N182 Chronic kidney disease, stage 2 (mild): Secondary | ICD-10-CM | POA: Diagnosis not present

## 2020-05-09 DIAGNOSIS — I10 Essential (primary) hypertension: Secondary | ICD-10-CM | POA: Diagnosis not present

## 2020-05-09 DIAGNOSIS — I5033 Acute on chronic diastolic (congestive) heart failure: Secondary | ICD-10-CM | POA: Diagnosis not present

## 2020-05-09 DIAGNOSIS — I679 Cerebrovascular disease, unspecified: Secondary | ICD-10-CM | POA: Diagnosis not present

## 2020-05-09 DIAGNOSIS — E785 Hyperlipidemia, unspecified: Secondary | ICD-10-CM | POA: Diagnosis not present

## 2020-05-09 DIAGNOSIS — Z7984 Long term (current) use of oral hypoglycemic drugs: Secondary | ICD-10-CM | POA: Diagnosis not present

## 2020-05-09 DIAGNOSIS — J9601 Acute respiratory failure with hypoxia: Secondary | ICD-10-CM | POA: Diagnosis not present

## 2020-05-09 DIAGNOSIS — E119 Type 2 diabetes mellitus without complications: Secondary | ICD-10-CM | POA: Diagnosis not present

## 2020-05-15 DIAGNOSIS — J9601 Acute respiratory failure with hypoxia: Secondary | ICD-10-CM | POA: Diagnosis not present

## 2020-05-16 DIAGNOSIS — Z7982 Long term (current) use of aspirin: Secondary | ICD-10-CM | POA: Diagnosis not present

## 2020-05-16 DIAGNOSIS — E782 Mixed hyperlipidemia: Secondary | ICD-10-CM | POA: Diagnosis not present

## 2020-05-16 DIAGNOSIS — D509 Iron deficiency anemia, unspecified: Secondary | ICD-10-CM | POA: Diagnosis not present

## 2020-05-16 DIAGNOSIS — I130 Hypertensive heart and chronic kidney disease with heart failure and stage 1 through stage 4 chronic kidney disease, or unspecified chronic kidney disease: Secondary | ICD-10-CM | POA: Diagnosis not present

## 2020-05-16 DIAGNOSIS — I272 Pulmonary hypertension, unspecified: Secondary | ICD-10-CM | POA: Diagnosis not present

## 2020-05-16 DIAGNOSIS — G2581 Restless legs syndrome: Secondary | ICD-10-CM | POA: Diagnosis not present

## 2020-05-16 DIAGNOSIS — G8929 Other chronic pain: Secondary | ICD-10-CM | POA: Diagnosis not present

## 2020-05-16 DIAGNOSIS — Z9981 Dependence on supplemental oxygen: Secondary | ICD-10-CM | POA: Diagnosis not present

## 2020-05-16 DIAGNOSIS — Z7984 Long term (current) use of oral hypoglycemic drugs: Secondary | ICD-10-CM | POA: Diagnosis not present

## 2020-05-16 DIAGNOSIS — K219 Gastro-esophageal reflux disease without esophagitis: Secondary | ICD-10-CM | POA: Diagnosis not present

## 2020-05-16 DIAGNOSIS — I5032 Chronic diastolic (congestive) heart failure: Secondary | ICD-10-CM | POA: Diagnosis not present

## 2020-05-16 DIAGNOSIS — N182 Chronic kidney disease, stage 2 (mild): Secondary | ICD-10-CM | POA: Diagnosis not present

## 2020-05-16 DIAGNOSIS — E1165 Type 2 diabetes mellitus with hyperglycemia: Secondary | ICD-10-CM | POA: Diagnosis not present

## 2020-05-16 DIAGNOSIS — Z951 Presence of aortocoronary bypass graft: Secondary | ICD-10-CM | POA: Diagnosis not present

## 2020-05-16 DIAGNOSIS — R531 Weakness: Secondary | ICD-10-CM | POA: Diagnosis not present

## 2020-05-16 DIAGNOSIS — E1122 Type 2 diabetes mellitus with diabetic chronic kidney disease: Secondary | ICD-10-CM | POA: Diagnosis not present

## 2020-05-16 DIAGNOSIS — I69398 Other sequelae of cerebral infarction: Secondary | ICD-10-CM | POA: Diagnosis not present

## 2020-05-16 DIAGNOSIS — I2510 Atherosclerotic heart disease of native coronary artery without angina pectoris: Secondary | ICD-10-CM | POA: Diagnosis not present

## 2020-05-16 DIAGNOSIS — E1136 Type 2 diabetes mellitus with diabetic cataract: Secondary | ICD-10-CM | POA: Diagnosis not present

## 2020-05-16 DIAGNOSIS — F32A Depression, unspecified: Secondary | ICD-10-CM | POA: Diagnosis not present

## 2020-05-16 DIAGNOSIS — R269 Unspecified abnormalities of gait and mobility: Secondary | ICD-10-CM | POA: Diagnosis not present

## 2020-05-18 DIAGNOSIS — I2510 Atherosclerotic heart disease of native coronary artery without angina pectoris: Secondary | ICD-10-CM | POA: Diagnosis not present

## 2020-05-18 DIAGNOSIS — R269 Unspecified abnormalities of gait and mobility: Secondary | ICD-10-CM | POA: Diagnosis not present

## 2020-05-18 DIAGNOSIS — D509 Iron deficiency anemia, unspecified: Secondary | ICD-10-CM | POA: Diagnosis not present

## 2020-05-18 DIAGNOSIS — G2581 Restless legs syndrome: Secondary | ICD-10-CM | POA: Diagnosis not present

## 2020-05-18 DIAGNOSIS — I272 Pulmonary hypertension, unspecified: Secondary | ICD-10-CM | POA: Diagnosis not present

## 2020-05-18 DIAGNOSIS — N182 Chronic kidney disease, stage 2 (mild): Secondary | ICD-10-CM | POA: Diagnosis not present

## 2020-05-18 DIAGNOSIS — G8929 Other chronic pain: Secondary | ICD-10-CM | POA: Diagnosis not present

## 2020-05-18 DIAGNOSIS — I5032 Chronic diastolic (congestive) heart failure: Secondary | ICD-10-CM | POA: Diagnosis not present

## 2020-05-18 DIAGNOSIS — E1136 Type 2 diabetes mellitus with diabetic cataract: Secondary | ICD-10-CM | POA: Diagnosis not present

## 2020-05-18 DIAGNOSIS — Z7984 Long term (current) use of oral hypoglycemic drugs: Secondary | ICD-10-CM | POA: Diagnosis not present

## 2020-05-18 DIAGNOSIS — E782 Mixed hyperlipidemia: Secondary | ICD-10-CM | POA: Diagnosis not present

## 2020-05-18 DIAGNOSIS — Z9981 Dependence on supplemental oxygen: Secondary | ICD-10-CM | POA: Diagnosis not present

## 2020-05-18 DIAGNOSIS — K219 Gastro-esophageal reflux disease without esophagitis: Secondary | ICD-10-CM | POA: Diagnosis not present

## 2020-05-18 DIAGNOSIS — R531 Weakness: Secondary | ICD-10-CM | POA: Diagnosis not present

## 2020-05-18 DIAGNOSIS — I69398 Other sequelae of cerebral infarction: Secondary | ICD-10-CM | POA: Diagnosis not present

## 2020-05-18 DIAGNOSIS — E1165 Type 2 diabetes mellitus with hyperglycemia: Secondary | ICD-10-CM | POA: Diagnosis not present

## 2020-05-18 DIAGNOSIS — E1122 Type 2 diabetes mellitus with diabetic chronic kidney disease: Secondary | ICD-10-CM | POA: Diagnosis not present

## 2020-05-18 DIAGNOSIS — I130 Hypertensive heart and chronic kidney disease with heart failure and stage 1 through stage 4 chronic kidney disease, or unspecified chronic kidney disease: Secondary | ICD-10-CM | POA: Diagnosis not present

## 2020-05-18 DIAGNOSIS — F32A Depression, unspecified: Secondary | ICD-10-CM | POA: Diagnosis not present

## 2020-05-18 DIAGNOSIS — Z951 Presence of aortocoronary bypass graft: Secondary | ICD-10-CM | POA: Diagnosis not present

## 2020-05-18 DIAGNOSIS — Z7982 Long term (current) use of aspirin: Secondary | ICD-10-CM | POA: Diagnosis not present

## 2020-05-27 DIAGNOSIS — D509 Iron deficiency anemia, unspecified: Secondary | ICD-10-CM | POA: Diagnosis not present

## 2020-05-27 DIAGNOSIS — E1122 Type 2 diabetes mellitus with diabetic chronic kidney disease: Secondary | ICD-10-CM | POA: Diagnosis not present

## 2020-05-27 DIAGNOSIS — R269 Unspecified abnormalities of gait and mobility: Secondary | ICD-10-CM | POA: Diagnosis not present

## 2020-05-27 DIAGNOSIS — E1165 Type 2 diabetes mellitus with hyperglycemia: Secondary | ICD-10-CM | POA: Diagnosis not present

## 2020-05-27 DIAGNOSIS — E1136 Type 2 diabetes mellitus with diabetic cataract: Secondary | ICD-10-CM | POA: Diagnosis not present

## 2020-05-27 DIAGNOSIS — I2510 Atherosclerotic heart disease of native coronary artery without angina pectoris: Secondary | ICD-10-CM | POA: Diagnosis not present

## 2020-05-27 DIAGNOSIS — K219 Gastro-esophageal reflux disease without esophagitis: Secondary | ICD-10-CM | POA: Diagnosis not present

## 2020-05-27 DIAGNOSIS — N182 Chronic kidney disease, stage 2 (mild): Secondary | ICD-10-CM | POA: Diagnosis not present

## 2020-05-27 DIAGNOSIS — G8929 Other chronic pain: Secondary | ICD-10-CM | POA: Diagnosis not present

## 2020-05-27 DIAGNOSIS — I272 Pulmonary hypertension, unspecified: Secondary | ICD-10-CM | POA: Diagnosis not present

## 2020-05-27 DIAGNOSIS — E782 Mixed hyperlipidemia: Secondary | ICD-10-CM | POA: Diagnosis not present

## 2020-05-27 DIAGNOSIS — I69398 Other sequelae of cerebral infarction: Secondary | ICD-10-CM | POA: Diagnosis not present

## 2020-05-27 DIAGNOSIS — R531 Weakness: Secondary | ICD-10-CM | POA: Diagnosis not present

## 2020-05-27 DIAGNOSIS — I5032 Chronic diastolic (congestive) heart failure: Secondary | ICD-10-CM | POA: Diagnosis not present

## 2020-05-27 DIAGNOSIS — Z7982 Long term (current) use of aspirin: Secondary | ICD-10-CM | POA: Diagnosis not present

## 2020-05-27 DIAGNOSIS — Z951 Presence of aortocoronary bypass graft: Secondary | ICD-10-CM | POA: Diagnosis not present

## 2020-05-27 DIAGNOSIS — Z7984 Long term (current) use of oral hypoglycemic drugs: Secondary | ICD-10-CM | POA: Diagnosis not present

## 2020-05-27 DIAGNOSIS — Z9981 Dependence on supplemental oxygen: Secondary | ICD-10-CM | POA: Diagnosis not present

## 2020-05-27 DIAGNOSIS — G2581 Restless legs syndrome: Secondary | ICD-10-CM | POA: Diagnosis not present

## 2020-05-27 DIAGNOSIS — I130 Hypertensive heart and chronic kidney disease with heart failure and stage 1 through stage 4 chronic kidney disease, or unspecified chronic kidney disease: Secondary | ICD-10-CM | POA: Diagnosis not present

## 2020-05-27 DIAGNOSIS — F32A Depression, unspecified: Secondary | ICD-10-CM | POA: Diagnosis not present

## 2020-05-29 DIAGNOSIS — D509 Iron deficiency anemia, unspecified: Secondary | ICD-10-CM | POA: Diagnosis not present

## 2020-05-29 DIAGNOSIS — Z9981 Dependence on supplemental oxygen: Secondary | ICD-10-CM | POA: Diagnosis not present

## 2020-05-29 DIAGNOSIS — E1136 Type 2 diabetes mellitus with diabetic cataract: Secondary | ICD-10-CM | POA: Diagnosis not present

## 2020-05-29 DIAGNOSIS — I130 Hypertensive heart and chronic kidney disease with heart failure and stage 1 through stage 4 chronic kidney disease, or unspecified chronic kidney disease: Secondary | ICD-10-CM | POA: Diagnosis not present

## 2020-05-29 DIAGNOSIS — I272 Pulmonary hypertension, unspecified: Secondary | ICD-10-CM | POA: Diagnosis not present

## 2020-05-29 DIAGNOSIS — F32A Depression, unspecified: Secondary | ICD-10-CM | POA: Diagnosis not present

## 2020-05-29 DIAGNOSIS — I5032 Chronic diastolic (congestive) heart failure: Secondary | ICD-10-CM | POA: Diagnosis not present

## 2020-05-29 DIAGNOSIS — R269 Unspecified abnormalities of gait and mobility: Secondary | ICD-10-CM | POA: Diagnosis not present

## 2020-05-29 DIAGNOSIS — I69398 Other sequelae of cerebral infarction: Secondary | ICD-10-CM | POA: Diagnosis not present

## 2020-05-29 DIAGNOSIS — E1122 Type 2 diabetes mellitus with diabetic chronic kidney disease: Secondary | ICD-10-CM | POA: Diagnosis not present

## 2020-05-29 DIAGNOSIS — E782 Mixed hyperlipidemia: Secondary | ICD-10-CM | POA: Diagnosis not present

## 2020-05-29 DIAGNOSIS — Z7982 Long term (current) use of aspirin: Secondary | ICD-10-CM | POA: Diagnosis not present

## 2020-05-29 DIAGNOSIS — Z7984 Long term (current) use of oral hypoglycemic drugs: Secondary | ICD-10-CM | POA: Diagnosis not present

## 2020-05-29 DIAGNOSIS — N182 Chronic kidney disease, stage 2 (mild): Secondary | ICD-10-CM | POA: Diagnosis not present

## 2020-05-29 DIAGNOSIS — R531 Weakness: Secondary | ICD-10-CM | POA: Diagnosis not present

## 2020-05-29 DIAGNOSIS — K219 Gastro-esophageal reflux disease without esophagitis: Secondary | ICD-10-CM | POA: Diagnosis not present

## 2020-05-29 DIAGNOSIS — G2581 Restless legs syndrome: Secondary | ICD-10-CM | POA: Diagnosis not present

## 2020-05-29 DIAGNOSIS — I2510 Atherosclerotic heart disease of native coronary artery without angina pectoris: Secondary | ICD-10-CM | POA: Diagnosis not present

## 2020-05-29 DIAGNOSIS — G8929 Other chronic pain: Secondary | ICD-10-CM | POA: Diagnosis not present

## 2020-05-29 DIAGNOSIS — E1165 Type 2 diabetes mellitus with hyperglycemia: Secondary | ICD-10-CM | POA: Diagnosis not present

## 2020-05-29 DIAGNOSIS — Z951 Presence of aortocoronary bypass graft: Secondary | ICD-10-CM | POA: Diagnosis not present

## 2020-05-30 DIAGNOSIS — K219 Gastro-esophageal reflux disease without esophagitis: Secondary | ICD-10-CM | POA: Diagnosis not present

## 2020-05-30 DIAGNOSIS — F32A Depression, unspecified: Secondary | ICD-10-CM | POA: Diagnosis not present

## 2020-05-30 DIAGNOSIS — Z9981 Dependence on supplemental oxygen: Secondary | ICD-10-CM | POA: Diagnosis not present

## 2020-05-30 DIAGNOSIS — R269 Unspecified abnormalities of gait and mobility: Secondary | ICD-10-CM | POA: Diagnosis not present

## 2020-05-30 DIAGNOSIS — D509 Iron deficiency anemia, unspecified: Secondary | ICD-10-CM | POA: Diagnosis not present

## 2020-05-30 DIAGNOSIS — E782 Mixed hyperlipidemia: Secondary | ICD-10-CM | POA: Diagnosis not present

## 2020-05-30 DIAGNOSIS — I2510 Atherosclerotic heart disease of native coronary artery without angina pectoris: Secondary | ICD-10-CM | POA: Diagnosis not present

## 2020-05-30 DIAGNOSIS — E1122 Type 2 diabetes mellitus with diabetic chronic kidney disease: Secondary | ICD-10-CM | POA: Diagnosis not present

## 2020-05-30 DIAGNOSIS — I130 Hypertensive heart and chronic kidney disease with heart failure and stage 1 through stage 4 chronic kidney disease, or unspecified chronic kidney disease: Secondary | ICD-10-CM | POA: Diagnosis not present

## 2020-05-30 DIAGNOSIS — I272 Pulmonary hypertension, unspecified: Secondary | ICD-10-CM | POA: Diagnosis not present

## 2020-05-30 DIAGNOSIS — Z951 Presence of aortocoronary bypass graft: Secondary | ICD-10-CM | POA: Diagnosis not present

## 2020-05-30 DIAGNOSIS — N182 Chronic kidney disease, stage 2 (mild): Secondary | ICD-10-CM | POA: Diagnosis not present

## 2020-05-30 DIAGNOSIS — G8929 Other chronic pain: Secondary | ICD-10-CM | POA: Diagnosis not present

## 2020-05-30 DIAGNOSIS — E1136 Type 2 diabetes mellitus with diabetic cataract: Secondary | ICD-10-CM | POA: Diagnosis not present

## 2020-05-30 DIAGNOSIS — R531 Weakness: Secondary | ICD-10-CM | POA: Diagnosis not present

## 2020-05-30 DIAGNOSIS — Z7984 Long term (current) use of oral hypoglycemic drugs: Secondary | ICD-10-CM | POA: Diagnosis not present

## 2020-05-30 DIAGNOSIS — G2581 Restless legs syndrome: Secondary | ICD-10-CM | POA: Diagnosis not present

## 2020-05-30 DIAGNOSIS — I69398 Other sequelae of cerebral infarction: Secondary | ICD-10-CM | POA: Diagnosis not present

## 2020-05-30 DIAGNOSIS — E1165 Type 2 diabetes mellitus with hyperglycemia: Secondary | ICD-10-CM | POA: Diagnosis not present

## 2020-05-30 DIAGNOSIS — I5032 Chronic diastolic (congestive) heart failure: Secondary | ICD-10-CM | POA: Diagnosis not present

## 2020-05-30 DIAGNOSIS — Z7982 Long term (current) use of aspirin: Secondary | ICD-10-CM | POA: Diagnosis not present

## 2020-05-31 DIAGNOSIS — E1165 Type 2 diabetes mellitus with hyperglycemia: Secondary | ICD-10-CM | POA: Diagnosis not present

## 2020-05-31 DIAGNOSIS — G8929 Other chronic pain: Secondary | ICD-10-CM | POA: Diagnosis not present

## 2020-05-31 DIAGNOSIS — R269 Unspecified abnormalities of gait and mobility: Secondary | ICD-10-CM | POA: Diagnosis not present

## 2020-05-31 DIAGNOSIS — E1136 Type 2 diabetes mellitus with diabetic cataract: Secondary | ICD-10-CM | POA: Diagnosis not present

## 2020-05-31 DIAGNOSIS — G2581 Restless legs syndrome: Secondary | ICD-10-CM | POA: Diagnosis not present

## 2020-05-31 DIAGNOSIS — F32A Depression, unspecified: Secondary | ICD-10-CM | POA: Diagnosis not present

## 2020-05-31 DIAGNOSIS — D509 Iron deficiency anemia, unspecified: Secondary | ICD-10-CM | POA: Diagnosis not present

## 2020-05-31 DIAGNOSIS — Z951 Presence of aortocoronary bypass graft: Secondary | ICD-10-CM | POA: Diagnosis not present

## 2020-05-31 DIAGNOSIS — I130 Hypertensive heart and chronic kidney disease with heart failure and stage 1 through stage 4 chronic kidney disease, or unspecified chronic kidney disease: Secondary | ICD-10-CM | POA: Diagnosis not present

## 2020-05-31 DIAGNOSIS — K219 Gastro-esophageal reflux disease without esophagitis: Secondary | ICD-10-CM | POA: Diagnosis not present

## 2020-05-31 DIAGNOSIS — I5032 Chronic diastolic (congestive) heart failure: Secondary | ICD-10-CM | POA: Diagnosis not present

## 2020-05-31 DIAGNOSIS — E785 Hyperlipidemia, unspecified: Secondary | ICD-10-CM | POA: Diagnosis not present

## 2020-05-31 DIAGNOSIS — E1151 Type 2 diabetes mellitus with diabetic peripheral angiopathy without gangrene: Secondary | ICD-10-CM | POA: Diagnosis not present

## 2020-05-31 DIAGNOSIS — I5033 Acute on chronic diastolic (congestive) heart failure: Secondary | ICD-10-CM | POA: Diagnosis not present

## 2020-05-31 DIAGNOSIS — Z7984 Long term (current) use of oral hypoglycemic drugs: Secondary | ICD-10-CM | POA: Diagnosis not present

## 2020-05-31 DIAGNOSIS — N182 Chronic kidney disease, stage 2 (mild): Secondary | ICD-10-CM | POA: Diagnosis not present

## 2020-05-31 DIAGNOSIS — I10 Essential (primary) hypertension: Secondary | ICD-10-CM | POA: Diagnosis not present

## 2020-05-31 DIAGNOSIS — I272 Pulmonary hypertension, unspecified: Secondary | ICD-10-CM | POA: Diagnosis not present

## 2020-05-31 DIAGNOSIS — I2510 Atherosclerotic heart disease of native coronary artery without angina pectoris: Secondary | ICD-10-CM | POA: Diagnosis not present

## 2020-05-31 DIAGNOSIS — Z7982 Long term (current) use of aspirin: Secondary | ICD-10-CM | POA: Diagnosis not present

## 2020-05-31 DIAGNOSIS — Z9981 Dependence on supplemental oxygen: Secondary | ICD-10-CM | POA: Diagnosis not present

## 2020-05-31 DIAGNOSIS — E1122 Type 2 diabetes mellitus with diabetic chronic kidney disease: Secondary | ICD-10-CM | POA: Diagnosis not present

## 2020-05-31 DIAGNOSIS — E119 Type 2 diabetes mellitus without complications: Secondary | ICD-10-CM | POA: Diagnosis not present

## 2020-05-31 DIAGNOSIS — R531 Weakness: Secondary | ICD-10-CM | POA: Diagnosis not present

## 2020-05-31 DIAGNOSIS — E782 Mixed hyperlipidemia: Secondary | ICD-10-CM | POA: Diagnosis not present

## 2020-05-31 DIAGNOSIS — I69398 Other sequelae of cerebral infarction: Secondary | ICD-10-CM | POA: Diagnosis not present

## 2020-06-07 DIAGNOSIS — I130 Hypertensive heart and chronic kidney disease with heart failure and stage 1 through stage 4 chronic kidney disease, or unspecified chronic kidney disease: Secondary | ICD-10-CM | POA: Diagnosis not present

## 2020-06-07 DIAGNOSIS — I5032 Chronic diastolic (congestive) heart failure: Secondary | ICD-10-CM | POA: Diagnosis not present

## 2020-06-07 DIAGNOSIS — K219 Gastro-esophageal reflux disease without esophagitis: Secondary | ICD-10-CM | POA: Diagnosis not present

## 2020-06-07 DIAGNOSIS — Z7984 Long term (current) use of oral hypoglycemic drugs: Secondary | ICD-10-CM | POA: Diagnosis not present

## 2020-06-07 DIAGNOSIS — G8929 Other chronic pain: Secondary | ICD-10-CM | POA: Diagnosis not present

## 2020-06-07 DIAGNOSIS — N182 Chronic kidney disease, stage 2 (mild): Secondary | ICD-10-CM | POA: Diagnosis not present

## 2020-06-07 DIAGNOSIS — I2510 Atherosclerotic heart disease of native coronary artery without angina pectoris: Secondary | ICD-10-CM | POA: Diagnosis not present

## 2020-06-07 DIAGNOSIS — F32A Depression, unspecified: Secondary | ICD-10-CM | POA: Diagnosis not present

## 2020-06-07 DIAGNOSIS — E1165 Type 2 diabetes mellitus with hyperglycemia: Secondary | ICD-10-CM | POA: Diagnosis not present

## 2020-06-07 DIAGNOSIS — R531 Weakness: Secondary | ICD-10-CM | POA: Diagnosis not present

## 2020-06-07 DIAGNOSIS — E782 Mixed hyperlipidemia: Secondary | ICD-10-CM | POA: Diagnosis not present

## 2020-06-07 DIAGNOSIS — I272 Pulmonary hypertension, unspecified: Secondary | ICD-10-CM | POA: Diagnosis not present

## 2020-06-07 DIAGNOSIS — E1122 Type 2 diabetes mellitus with diabetic chronic kidney disease: Secondary | ICD-10-CM | POA: Diagnosis not present

## 2020-06-07 DIAGNOSIS — Z951 Presence of aortocoronary bypass graft: Secondary | ICD-10-CM | POA: Diagnosis not present

## 2020-06-07 DIAGNOSIS — G2581 Restless legs syndrome: Secondary | ICD-10-CM | POA: Diagnosis not present

## 2020-06-07 DIAGNOSIS — R269 Unspecified abnormalities of gait and mobility: Secondary | ICD-10-CM | POA: Diagnosis not present

## 2020-06-07 DIAGNOSIS — Z7982 Long term (current) use of aspirin: Secondary | ICD-10-CM | POA: Diagnosis not present

## 2020-06-07 DIAGNOSIS — E1136 Type 2 diabetes mellitus with diabetic cataract: Secondary | ICD-10-CM | POA: Diagnosis not present

## 2020-06-07 DIAGNOSIS — Z9981 Dependence on supplemental oxygen: Secondary | ICD-10-CM | POA: Diagnosis not present

## 2020-06-07 DIAGNOSIS — I69398 Other sequelae of cerebral infarction: Secondary | ICD-10-CM | POA: Diagnosis not present

## 2020-06-07 DIAGNOSIS — D509 Iron deficiency anemia, unspecified: Secondary | ICD-10-CM | POA: Diagnosis not present

## 2020-06-08 DIAGNOSIS — I272 Pulmonary hypertension, unspecified: Secondary | ICD-10-CM | POA: Diagnosis not present

## 2020-06-08 DIAGNOSIS — I69398 Other sequelae of cerebral infarction: Secondary | ICD-10-CM | POA: Diagnosis not present

## 2020-06-08 DIAGNOSIS — G8929 Other chronic pain: Secondary | ICD-10-CM | POA: Diagnosis not present

## 2020-06-08 DIAGNOSIS — R531 Weakness: Secondary | ICD-10-CM | POA: Diagnosis not present

## 2020-06-08 DIAGNOSIS — E782 Mixed hyperlipidemia: Secondary | ICD-10-CM | POA: Diagnosis not present

## 2020-06-08 DIAGNOSIS — I5032 Chronic diastolic (congestive) heart failure: Secondary | ICD-10-CM | POA: Diagnosis not present

## 2020-06-08 DIAGNOSIS — R269 Unspecified abnormalities of gait and mobility: Secondary | ICD-10-CM | POA: Diagnosis not present

## 2020-06-08 DIAGNOSIS — Z9981 Dependence on supplemental oxygen: Secondary | ICD-10-CM | POA: Diagnosis not present

## 2020-06-08 DIAGNOSIS — F32A Depression, unspecified: Secondary | ICD-10-CM | POA: Diagnosis not present

## 2020-06-08 DIAGNOSIS — Z951 Presence of aortocoronary bypass graft: Secondary | ICD-10-CM | POA: Diagnosis not present

## 2020-06-08 DIAGNOSIS — Z7984 Long term (current) use of oral hypoglycemic drugs: Secondary | ICD-10-CM | POA: Diagnosis not present

## 2020-06-08 DIAGNOSIS — E1136 Type 2 diabetes mellitus with diabetic cataract: Secondary | ICD-10-CM | POA: Diagnosis not present

## 2020-06-08 DIAGNOSIS — I130 Hypertensive heart and chronic kidney disease with heart failure and stage 1 through stage 4 chronic kidney disease, or unspecified chronic kidney disease: Secondary | ICD-10-CM | POA: Diagnosis not present

## 2020-06-08 DIAGNOSIS — E1122 Type 2 diabetes mellitus with diabetic chronic kidney disease: Secondary | ICD-10-CM | POA: Diagnosis not present

## 2020-06-08 DIAGNOSIS — G2581 Restless legs syndrome: Secondary | ICD-10-CM | POA: Diagnosis not present

## 2020-06-08 DIAGNOSIS — K219 Gastro-esophageal reflux disease without esophagitis: Secondary | ICD-10-CM | POA: Diagnosis not present

## 2020-06-08 DIAGNOSIS — D509 Iron deficiency anemia, unspecified: Secondary | ICD-10-CM | POA: Diagnosis not present

## 2020-06-08 DIAGNOSIS — Z7982 Long term (current) use of aspirin: Secondary | ICD-10-CM | POA: Diagnosis not present

## 2020-06-08 DIAGNOSIS — E1165 Type 2 diabetes mellitus with hyperglycemia: Secondary | ICD-10-CM | POA: Diagnosis not present

## 2020-06-08 DIAGNOSIS — I2510 Atherosclerotic heart disease of native coronary artery without angina pectoris: Secondary | ICD-10-CM | POA: Diagnosis not present

## 2020-06-08 DIAGNOSIS — N182 Chronic kidney disease, stage 2 (mild): Secondary | ICD-10-CM | POA: Diagnosis not present

## 2020-06-11 DIAGNOSIS — I951 Orthostatic hypotension: Secondary | ICD-10-CM | POA: Diagnosis not present

## 2020-06-11 DIAGNOSIS — I2510 Atherosclerotic heart disease of native coronary artery without angina pectoris: Secondary | ICD-10-CM | POA: Diagnosis not present

## 2020-06-11 DIAGNOSIS — S3993XA Unspecified injury of pelvis, initial encounter: Secondary | ICD-10-CM | POA: Diagnosis not present

## 2020-06-11 DIAGNOSIS — N390 Urinary tract infection, site not specified: Secondary | ICD-10-CM | POA: Diagnosis not present

## 2020-06-11 DIAGNOSIS — W19XXXA Unspecified fall, initial encounter: Secondary | ICD-10-CM | POA: Diagnosis not present

## 2020-06-12 DIAGNOSIS — E1136 Type 2 diabetes mellitus with diabetic cataract: Secondary | ICD-10-CM | POA: Diagnosis not present

## 2020-06-12 DIAGNOSIS — E782 Mixed hyperlipidemia: Secondary | ICD-10-CM | POA: Diagnosis not present

## 2020-06-12 DIAGNOSIS — Z9981 Dependence on supplemental oxygen: Secondary | ICD-10-CM | POA: Diagnosis not present

## 2020-06-12 DIAGNOSIS — G2581 Restless legs syndrome: Secondary | ICD-10-CM | POA: Diagnosis not present

## 2020-06-12 DIAGNOSIS — I2510 Atherosclerotic heart disease of native coronary artery without angina pectoris: Secondary | ICD-10-CM | POA: Diagnosis not present

## 2020-06-12 DIAGNOSIS — D509 Iron deficiency anemia, unspecified: Secondary | ICD-10-CM | POA: Diagnosis not present

## 2020-06-12 DIAGNOSIS — Z7984 Long term (current) use of oral hypoglycemic drugs: Secondary | ICD-10-CM | POA: Diagnosis not present

## 2020-06-12 DIAGNOSIS — I130 Hypertensive heart and chronic kidney disease with heart failure and stage 1 through stage 4 chronic kidney disease, or unspecified chronic kidney disease: Secondary | ICD-10-CM | POA: Diagnosis not present

## 2020-06-12 DIAGNOSIS — I69398 Other sequelae of cerebral infarction: Secondary | ICD-10-CM | POA: Diagnosis not present

## 2020-06-12 DIAGNOSIS — Z951 Presence of aortocoronary bypass graft: Secondary | ICD-10-CM | POA: Diagnosis not present

## 2020-06-12 DIAGNOSIS — I272 Pulmonary hypertension, unspecified: Secondary | ICD-10-CM | POA: Diagnosis not present

## 2020-06-12 DIAGNOSIS — R531 Weakness: Secondary | ICD-10-CM | POA: Diagnosis not present

## 2020-06-12 DIAGNOSIS — K219 Gastro-esophageal reflux disease without esophagitis: Secondary | ICD-10-CM | POA: Diagnosis not present

## 2020-06-12 DIAGNOSIS — E1165 Type 2 diabetes mellitus with hyperglycemia: Secondary | ICD-10-CM | POA: Diagnosis not present

## 2020-06-12 DIAGNOSIS — R269 Unspecified abnormalities of gait and mobility: Secondary | ICD-10-CM | POA: Diagnosis not present

## 2020-06-12 DIAGNOSIS — E1122 Type 2 diabetes mellitus with diabetic chronic kidney disease: Secondary | ICD-10-CM | POA: Diagnosis not present

## 2020-06-12 DIAGNOSIS — I5032 Chronic diastolic (congestive) heart failure: Secondary | ICD-10-CM | POA: Diagnosis not present

## 2020-06-12 DIAGNOSIS — F32A Depression, unspecified: Secondary | ICD-10-CM | POA: Diagnosis not present

## 2020-06-12 DIAGNOSIS — G8929 Other chronic pain: Secondary | ICD-10-CM | POA: Diagnosis not present

## 2020-06-12 DIAGNOSIS — Z7982 Long term (current) use of aspirin: Secondary | ICD-10-CM | POA: Diagnosis not present

## 2020-06-12 DIAGNOSIS — N182 Chronic kidney disease, stage 2 (mild): Secondary | ICD-10-CM | POA: Diagnosis not present

## 2020-06-15 DIAGNOSIS — J9601 Acute respiratory failure with hypoxia: Secondary | ICD-10-CM | POA: Diagnosis not present

## 2020-06-19 DIAGNOSIS — E1122 Type 2 diabetes mellitus with diabetic chronic kidney disease: Secondary | ICD-10-CM | POA: Diagnosis not present

## 2020-06-19 DIAGNOSIS — I2510 Atherosclerotic heart disease of native coronary artery without angina pectoris: Secondary | ICD-10-CM | POA: Diagnosis not present

## 2020-06-19 DIAGNOSIS — I5032 Chronic diastolic (congestive) heart failure: Secondary | ICD-10-CM | POA: Diagnosis not present

## 2020-06-19 DIAGNOSIS — I130 Hypertensive heart and chronic kidney disease with heart failure and stage 1 through stage 4 chronic kidney disease, or unspecified chronic kidney disease: Secondary | ICD-10-CM | POA: Diagnosis not present

## 2020-06-19 DIAGNOSIS — F32A Depression, unspecified: Secondary | ICD-10-CM | POA: Diagnosis not present

## 2020-06-19 DIAGNOSIS — K219 Gastro-esophageal reflux disease without esophagitis: Secondary | ICD-10-CM | POA: Diagnosis not present

## 2020-06-19 DIAGNOSIS — N182 Chronic kidney disease, stage 2 (mild): Secondary | ICD-10-CM | POA: Diagnosis not present

## 2020-06-19 DIAGNOSIS — Z9981 Dependence on supplemental oxygen: Secondary | ICD-10-CM | POA: Diagnosis not present

## 2020-06-19 DIAGNOSIS — R269 Unspecified abnormalities of gait and mobility: Secondary | ICD-10-CM | POA: Diagnosis not present

## 2020-06-19 DIAGNOSIS — D509 Iron deficiency anemia, unspecified: Secondary | ICD-10-CM | POA: Diagnosis not present

## 2020-06-19 DIAGNOSIS — Z7982 Long term (current) use of aspirin: Secondary | ICD-10-CM | POA: Diagnosis not present

## 2020-06-19 DIAGNOSIS — R531 Weakness: Secondary | ICD-10-CM | POA: Diagnosis not present

## 2020-06-19 DIAGNOSIS — G8929 Other chronic pain: Secondary | ICD-10-CM | POA: Diagnosis not present

## 2020-06-19 DIAGNOSIS — G2581 Restless legs syndrome: Secondary | ICD-10-CM | POA: Diagnosis not present

## 2020-06-19 DIAGNOSIS — E1165 Type 2 diabetes mellitus with hyperglycemia: Secondary | ICD-10-CM | POA: Diagnosis not present

## 2020-06-19 DIAGNOSIS — I69398 Other sequelae of cerebral infarction: Secondary | ICD-10-CM | POA: Diagnosis not present

## 2020-06-19 DIAGNOSIS — Z951 Presence of aortocoronary bypass graft: Secondary | ICD-10-CM | POA: Diagnosis not present

## 2020-06-19 DIAGNOSIS — E1136 Type 2 diabetes mellitus with diabetic cataract: Secondary | ICD-10-CM | POA: Diagnosis not present

## 2020-06-19 DIAGNOSIS — I272 Pulmonary hypertension, unspecified: Secondary | ICD-10-CM | POA: Diagnosis not present

## 2020-06-19 DIAGNOSIS — E782 Mixed hyperlipidemia: Secondary | ICD-10-CM | POA: Diagnosis not present

## 2020-06-19 DIAGNOSIS — Z7984 Long term (current) use of oral hypoglycemic drugs: Secondary | ICD-10-CM | POA: Diagnosis not present

## 2020-06-21 DIAGNOSIS — M4316 Spondylolisthesis, lumbar region: Secondary | ICD-10-CM | POA: Diagnosis not present

## 2020-06-21 DIAGNOSIS — M5416 Radiculopathy, lumbar region: Secondary | ICD-10-CM | POA: Diagnosis not present

## 2020-06-23 ENCOUNTER — Other Ambulatory Visit: Payer: Self-pay

## 2020-06-23 ENCOUNTER — Inpatient Hospital Stay (HOSPITAL_COMMUNITY)
Admission: EM | Admit: 2020-06-23 | Discharge: 2020-06-28 | DRG: 690 | Disposition: A | Discharge status: Skilled Nursing Facility | Payer: Medicare Other | Attending: Internal Medicine | Admitting: Internal Medicine

## 2020-06-23 ENCOUNTER — Emergency Department (HOSPITAL_COMMUNITY): Payer: Medicare Other

## 2020-06-23 DIAGNOSIS — R55 Syncope and collapse: Secondary | ICD-10-CM | POA: Diagnosis not present

## 2020-06-23 DIAGNOSIS — Z741 Need for assistance with personal care: Secondary | ICD-10-CM | POA: Diagnosis not present

## 2020-06-23 DIAGNOSIS — W19XXXA Unspecified fall, initial encounter: Secondary | ICD-10-CM | POA: Diagnosis present

## 2020-06-23 DIAGNOSIS — Z743 Need for continuous supervision: Secondary | ICD-10-CM | POA: Diagnosis not present

## 2020-06-23 DIAGNOSIS — E119 Type 2 diabetes mellitus without complications: Secondary | ICD-10-CM

## 2020-06-23 DIAGNOSIS — M6282 Rhabdomyolysis: Secondary | ICD-10-CM | POA: Diagnosis present

## 2020-06-23 DIAGNOSIS — Z8249 Family history of ischemic heart disease and other diseases of the circulatory system: Secondary | ICD-10-CM | POA: Diagnosis not present

## 2020-06-23 DIAGNOSIS — F419 Anxiety disorder, unspecified: Secondary | ICD-10-CM | POA: Diagnosis present

## 2020-06-23 DIAGNOSIS — M5033 Other cervical disc degeneration, cervicothoracic region: Secondary | ICD-10-CM | POA: Diagnosis not present

## 2020-06-23 DIAGNOSIS — T82848A Pain from vascular prosthetic devices, implants and grafts, initial encounter: Secondary | ICD-10-CM | POA: Diagnosis present

## 2020-06-23 DIAGNOSIS — R531 Weakness: Secondary | ICD-10-CM | POA: Diagnosis not present

## 2020-06-23 DIAGNOSIS — D333 Benign neoplasm of cranial nerves: Secondary | ICD-10-CM | POA: Diagnosis present

## 2020-06-23 DIAGNOSIS — I509 Heart failure, unspecified: Secondary | ICD-10-CM | POA: Diagnosis not present

## 2020-06-23 DIAGNOSIS — F32A Depression, unspecified: Secondary | ICD-10-CM | POA: Diagnosis present

## 2020-06-23 DIAGNOSIS — E782 Mixed hyperlipidemia: Secondary | ICD-10-CM | POA: Diagnosis present

## 2020-06-23 DIAGNOSIS — Z888 Allergy status to other drugs, medicaments and biological substances status: Secondary | ICD-10-CM

## 2020-06-23 DIAGNOSIS — E785 Hyperlipidemia, unspecified: Secondary | ICD-10-CM | POA: Diagnosis not present

## 2020-06-23 DIAGNOSIS — G2581 Restless legs syndrome: Secondary | ICD-10-CM | POA: Diagnosis not present

## 2020-06-23 DIAGNOSIS — Z91040 Latex allergy status: Secondary | ICD-10-CM | POA: Diagnosis not present

## 2020-06-23 DIAGNOSIS — R404 Transient alteration of awareness: Secondary | ICD-10-CM | POA: Diagnosis not present

## 2020-06-23 DIAGNOSIS — G8929 Other chronic pain: Secondary | ICD-10-CM | POA: Diagnosis not present

## 2020-06-23 DIAGNOSIS — I5032 Chronic diastolic (congestive) heart failure: Secondary | ICD-10-CM | POA: Diagnosis present

## 2020-06-23 DIAGNOSIS — Z947 Corneal transplant status: Secondary | ICD-10-CM

## 2020-06-23 DIAGNOSIS — Z79899 Other long term (current) drug therapy: Secondary | ICD-10-CM

## 2020-06-23 DIAGNOSIS — Z20822 Contact with and (suspected) exposure to covid-19: Secondary | ICD-10-CM | POA: Diagnosis not present

## 2020-06-23 DIAGNOSIS — I951 Orthostatic hypotension: Secondary | ICD-10-CM | POA: Diagnosis not present

## 2020-06-23 DIAGNOSIS — G9340 Encephalopathy, unspecified: Secondary | ICD-10-CM

## 2020-06-23 DIAGNOSIS — B9620 Unspecified Escherichia coli [E. coli] as the cause of diseases classified elsewhere: Secondary | ICD-10-CM | POA: Diagnosis present

## 2020-06-23 DIAGNOSIS — Z83438 Family history of other disorder of lipoprotein metabolism and other lipidemia: Secondary | ICD-10-CM | POA: Diagnosis not present

## 2020-06-23 DIAGNOSIS — I10 Essential (primary) hypertension: Secondary | ICD-10-CM | POA: Diagnosis not present

## 2020-06-23 DIAGNOSIS — Z66 Do not resuscitate: Secondary | ICD-10-CM | POA: Diagnosis present

## 2020-06-23 DIAGNOSIS — Y92009 Unspecified place in unspecified non-institutional (private) residence as the place of occurrence of the external cause: Secondary | ICD-10-CM

## 2020-06-23 DIAGNOSIS — M47812 Spondylosis without myelopathy or radiculopathy, cervical region: Secondary | ICD-10-CM | POA: Diagnosis not present

## 2020-06-23 DIAGNOSIS — R262 Difficulty in walking, not elsewhere classified: Secondary | ICD-10-CM | POA: Diagnosis not present

## 2020-06-23 DIAGNOSIS — I2510 Atherosclerotic heart disease of native coronary artery without angina pectoris: Secondary | ICD-10-CM | POA: Diagnosis present

## 2020-06-23 DIAGNOSIS — Z885 Allergy status to narcotic agent status: Secondary | ICD-10-CM

## 2020-06-23 DIAGNOSIS — E1165 Type 2 diabetes mellitus with hyperglycemia: Secondary | ICD-10-CM | POA: Diagnosis not present

## 2020-06-23 DIAGNOSIS — I110 Hypertensive heart disease with heart failure: Secondary | ICD-10-CM | POA: Diagnosis not present

## 2020-06-23 DIAGNOSIS — W010XXA Fall on same level from slipping, tripping and stumbling without subsequent striking against object, initial encounter: Secondary | ICD-10-CM | POA: Diagnosis present

## 2020-06-23 DIAGNOSIS — Z751 Person awaiting admission to adequate facility elsewhere: Secondary | ICD-10-CM

## 2020-06-23 DIAGNOSIS — S0990XA Unspecified injury of head, initial encounter: Secondary | ICD-10-CM | POA: Diagnosis not present

## 2020-06-23 DIAGNOSIS — Y848 Other medical procedures as the cause of abnormal reaction of the patient, or of later complication, without mention of misadventure at the time of the procedure: Secondary | ICD-10-CM | POA: Diagnosis present

## 2020-06-23 DIAGNOSIS — E1140 Type 2 diabetes mellitus with diabetic neuropathy, unspecified: Secondary | ICD-10-CM | POA: Diagnosis present

## 2020-06-23 DIAGNOSIS — K219 Gastro-esophageal reflux disease without esophagitis: Secondary | ICD-10-CM | POA: Diagnosis present

## 2020-06-23 DIAGNOSIS — Z8673 Personal history of transient ischemic attack (TIA), and cerebral infarction without residual deficits: Secondary | ICD-10-CM | POA: Diagnosis not present

## 2020-06-23 DIAGNOSIS — N390 Urinary tract infection, site not specified: Secondary | ICD-10-CM | POA: Diagnosis not present

## 2020-06-23 DIAGNOSIS — N3000 Acute cystitis without hematuria: Secondary | ICD-10-CM | POA: Diagnosis not present

## 2020-06-23 DIAGNOSIS — M6281 Muscle weakness (generalized): Secondary | ICD-10-CM | POA: Diagnosis not present

## 2020-06-23 DIAGNOSIS — R2681 Unsteadiness on feet: Secondary | ICD-10-CM | POA: Diagnosis not present

## 2020-06-23 DIAGNOSIS — S3993XA Unspecified injury of pelvis, initial encounter: Secondary | ICD-10-CM | POA: Diagnosis not present

## 2020-06-23 DIAGNOSIS — Z7982 Long term (current) use of aspirin: Secondary | ICD-10-CM

## 2020-06-23 DIAGNOSIS — S199XXA Unspecified injury of neck, initial encounter: Secondary | ICD-10-CM | POA: Diagnosis not present

## 2020-06-23 DIAGNOSIS — Z951 Presence of aortocoronary bypass graft: Secondary | ICD-10-CM

## 2020-06-23 DIAGNOSIS — J302 Other seasonal allergic rhinitis: Secondary | ICD-10-CM | POA: Diagnosis present

## 2020-06-23 DIAGNOSIS — I639 Cerebral infarction, unspecified: Secondary | ICD-10-CM | POA: Diagnosis not present

## 2020-06-23 DIAGNOSIS — M549 Dorsalgia, unspecified: Secondary | ICD-10-CM | POA: Diagnosis present

## 2020-06-23 DIAGNOSIS — N182 Chronic kidney disease, stage 2 (mild): Secondary | ICD-10-CM | POA: Diagnosis not present

## 2020-06-23 DIAGNOSIS — J9811 Atelectasis: Secondary | ICD-10-CM | POA: Diagnosis not present

## 2020-06-23 DIAGNOSIS — Z9181 History of falling: Secondary | ICD-10-CM | POA: Diagnosis not present

## 2020-06-23 DIAGNOSIS — R2689 Other abnormalities of gait and mobility: Secondary | ICD-10-CM | POA: Diagnosis present

## 2020-06-23 DIAGNOSIS — J9601 Acute respiratory failure with hypoxia: Secondary | ICD-10-CM | POA: Diagnosis not present

## 2020-06-23 DIAGNOSIS — R296 Repeated falls: Secondary | ICD-10-CM | POA: Diagnosis present

## 2020-06-23 LAB — COMPREHENSIVE METABOLIC PANEL
ALT: 54 U/L — ABNORMAL HIGH (ref 0–44)
AST: 71 U/L — ABNORMAL HIGH (ref 15–41)
Albumin: 3 g/dL — ABNORMAL LOW (ref 3.5–5.0)
Alkaline Phosphatase: 164 U/L — ABNORMAL HIGH (ref 38–126)
Anion gap: 11 (ref 5–15)
BUN: 19 mg/dL (ref 8–23)
CO2: 17 mmol/L — ABNORMAL LOW (ref 22–32)
Calcium: 10.4 mg/dL — ABNORMAL HIGH (ref 8.9–10.3)
Chloride: 111 mmol/L (ref 98–111)
Creatinine, Ser: 0.65 mg/dL (ref 0.44–1.00)
GFR, Estimated: >60 mL/min (ref >60)
Glucose, Bld: 207 mg/dL — ABNORMAL HIGH (ref 70–99)
Potassium: 4.3 mmol/L (ref 3.5–5.1)
Sodium: 139 mmol/L (ref 135–145)
Total Bilirubin: 1.6 mg/dL — ABNORMAL HIGH (ref 0.3–1.2)
Total Protein: 7.2 g/dL (ref 6.5–8.1)

## 2020-06-23 LAB — I-STAT VENOUS BLOOD GAS, ED
Acid-base deficit: 5 mmol/L — ABNORMAL HIGH (ref 0.0–2.0)
Bicarbonate: 20.7 mmol/L (ref 20.0–28.0)
Calcium, Ion: 1.36 mmol/L (ref 1.15–1.40)
HCT: 64 % — ABNORMAL HIGH (ref 36.0–46.0)
Hemoglobin: 21.8 g/dL (ref 12.0–15.0)
O2 Saturation: 81 %
Potassium: 4.5 mmol/L (ref 3.5–5.1)
Sodium: 143 mmol/L (ref 135–145)
TCO2: 22 mmol/L (ref 22–32)
pCO2, Ven: 38.8 mmHg — ABNORMAL LOW (ref 44.0–60.0)
pH, Ven: 7.336 (ref 7.250–7.430)
pO2, Ven: 48 mmHg — ABNORMAL HIGH (ref 32.0–45.0)

## 2020-06-23 LAB — URINALYSIS, ROUTINE W REFLEX MICROSCOPIC
Bilirubin Urine: NEGATIVE
Glucose, UA: NEGATIVE mg/dL
Ketones, ur: 20 mg/dL — AB
Nitrite: NEGATIVE
Protein, ur: 30 mg/dL — AB
Specific Gravity, Urine: 1.014 (ref 1.005–1.030)
WBC, UA: >50 WBC/hpf — ABNORMAL HIGH (ref 0–5)
pH: 6 (ref 5.0–8.0)

## 2020-06-23 LAB — CBC WITH DIFFERENTIAL/PLATELET
Abs Immature Granulocytes: 0.1 10*3/uL — ABNORMAL HIGH (ref 0.00–0.07)
Basophils Absolute: 0 10*3/uL (ref 0.0–0.1)
Basophils Relative: 0 %
Eosinophils Absolute: 0 10*3/uL (ref 0.0–0.5)
Eosinophils Relative: 1 %
HCT: 30.9 % — ABNORMAL LOW (ref 36.0–46.0)
Hemoglobin: 10 g/dL — ABNORMAL LOW (ref 12.0–15.0)
Immature Granulocytes: 1 %
Lymphocytes Relative: 13 %
Lymphs Abs: 0.9 10*3/uL (ref 0.7–4.0)
MCH: 36 pg — ABNORMAL HIGH (ref 26.0–34.0)
MCHC: 32.4 g/dL (ref 30.0–36.0)
MCV: 111.2 fL — ABNORMAL HIGH (ref 80.0–100.0)
Monocytes Absolute: 0.6 10*3/uL (ref 0.1–1.0)
Monocytes Relative: 9 %
Neutro Abs: 5.2 10*3/uL (ref 1.7–7.7)
Neutrophils Relative %: 76 %
Platelets: 215 10*3/uL (ref 150–400)
RBC: 2.78 MIL/uL — ABNORMAL LOW (ref 3.87–5.11)
RDW: 14.4 % (ref 11.5–15.5)
WBC: 6.9 10*3/uL (ref 4.0–10.5)
nRBC: 0 % (ref 0.0–0.2)

## 2020-06-23 LAB — RESP PANEL BY RT-PCR (FLU A&B, COVID) ARPGX2
Influenza A by PCR: NEGATIVE
Influenza B by PCR: NEGATIVE
SARS Coronavirus 2 by RT PCR: NEGATIVE

## 2020-06-23 LAB — BLOOD GAS, VENOUS
Acid-base deficit: 4.4 mmol/L — ABNORMAL HIGH (ref 0.0–2.0)
Bicarbonate: 20.2 mmol/L (ref 20.0–28.0)
Drawn by: 1390
O2 Saturation: 64.8 %
Patient temperature: 37
pCO2, Ven: 37 mmHg — ABNORMAL LOW (ref 44.0–60.0)
pH, Ven: 7.355 (ref 7.250–7.430)
pO2, Ven: 34.7 mmHg (ref 32.0–45.0)

## 2020-06-23 LAB — CK: Total CK: 436 U/L — ABNORMAL HIGH (ref 38–234)

## 2020-06-23 LAB — CBG MONITORING, ED: Glucose-Capillary: 192 mg/dL — ABNORMAL HIGH (ref 70–99)

## 2020-06-23 LAB — LACTIC ACID, PLASMA: Lactic Acid, Venous: 1 mmol/L (ref 0.5–1.9)

## 2020-06-23 MED ORDER — SODIUM CHLORIDE 0.9% FLUSH
3.0000 mL | Freq: Two times a day (BID) | INTRAVENOUS | Status: DC
  Administered 2020-06-23 – 2020-06-26 (×5): 3 mL via INTRAVENOUS

## 2020-06-23 MED ORDER — FERROUS SULFATE 325 (65 FE) MG PO TABS
325.0000 mg | ORAL_TABLET | Freq: Every day | ORAL | Status: DC
  Administered 2020-06-24 – 2020-06-28 (×5): 325 mg via ORAL
  Filled 2020-06-23 (×5): qty 1

## 2020-06-23 MED ORDER — GLIPIZIDE ER 5 MG PO TB24
5.0000 mg | ORAL_TABLET | Freq: Every day | ORAL | Status: DC
  Administered 2020-06-24: 5 mg via ORAL
  Filled 2020-06-23: qty 1

## 2020-06-23 MED ORDER — PROMETHAZINE HCL 25 MG PO TABS
12.5000 mg | ORAL_TABLET | Freq: Four times a day (QID) | ORAL | Status: DC | PRN
  Administered 2020-06-24 – 2020-06-25 (×3): 12.5 mg via ORAL
  Filled 2020-06-23 (×3): qty 1

## 2020-06-23 MED ORDER — ASPIRIN EC 81 MG PO TBEC
81.0000 mg | DELAYED_RELEASE_TABLET | Freq: Every evening | ORAL | Status: DC
  Administered 2020-06-23 – 2020-06-27 (×5): 81 mg via ORAL
  Filled 2020-06-23 (×5): qty 1

## 2020-06-23 MED ORDER — ASCORBIC ACID 500 MG PO TABS
1000.0000 mg | ORAL_TABLET | Freq: Every day | ORAL | Status: DC
  Administered 2020-06-24 – 2020-06-28 (×5): 1000 mg via ORAL
  Filled 2020-06-23 (×5): qty 2

## 2020-06-23 MED ORDER — VITAMIN B-12 1000 MCG PO TABS
2500.0000 ug | ORAL_TABLET | Freq: Every day | ORAL | Status: DC
  Administered 2020-06-24 – 2020-06-28 (×5): 2500 ug via ORAL
  Filled 2020-06-23 (×5): qty 3

## 2020-06-23 MED ORDER — CITALOPRAM HYDROBROMIDE 20 MG PO TABS
20.0000 mg | ORAL_TABLET | Freq: Every morning | ORAL | Status: DC
  Administered 2020-06-24 – 2020-06-28 (×5): 20 mg via ORAL
  Filled 2020-06-23 (×5): qty 1

## 2020-06-23 MED ORDER — GABAPENTIN 300 MG PO CAPS
300.0000 mg | ORAL_CAPSULE | Freq: Four times a day (QID) | ORAL | Status: DC
  Administered 2020-06-23 – 2020-06-28 (×18): 300 mg via ORAL
  Filled 2020-06-23 (×14): qty 1
  Filled 2020-06-23: qty 3
  Filled 2020-06-23 (×4): qty 1

## 2020-06-23 MED ORDER — HYDROCODONE-ACETAMINOPHEN 10-325 MG PO TABS
1.0000 | ORAL_TABLET | Freq: Two times a day (BID) | ORAL | Status: DC | PRN
  Administered 2020-06-24 – 2020-06-27 (×8): 1 via ORAL
  Filled 2020-06-23 (×9): qty 1

## 2020-06-23 MED ORDER — CARVEDILOL 25 MG PO TABS
25.0000 mg | ORAL_TABLET | Freq: Two times a day (BID) | ORAL | Status: DC
  Administered 2020-06-23 – 2020-06-24 (×2): 25 mg via ORAL
  Filled 2020-06-23 (×2): qty 1

## 2020-06-23 MED ORDER — AMLODIPINE BESYLATE 5 MG PO TABS
5.0000 mg | ORAL_TABLET | Freq: Every day | ORAL | Status: DC
  Administered 2020-06-23 – 2020-06-24 (×2): 5 mg via ORAL
  Filled 2020-06-23 (×2): qty 1

## 2020-06-23 MED ORDER — SODIUM CHLORIDE 0.9 % IV SOLN
1.0000 g | Freq: Once | INTRAVENOUS | Status: AC
  Administered 2020-06-23: 1 g via INTRAVENOUS
  Filled 2020-06-23: qty 10

## 2020-06-23 MED ORDER — ACETAMINOPHEN 325 MG PO TABS
650.0000 mg | ORAL_TABLET | Freq: Four times a day (QID) | ORAL | Status: DC | PRN
  Administered 2020-06-24 – 2020-06-28 (×5): 650 mg via ORAL
  Filled 2020-06-23 (×7): qty 2

## 2020-06-23 MED ORDER — PANTOPRAZOLE SODIUM 40 MG PO TBEC
40.0000 mg | DELAYED_RELEASE_TABLET | Freq: Every day | ORAL | Status: DC | PRN
  Administered 2020-06-25: 40 mg via ORAL
  Filled 2020-06-23: qty 1

## 2020-06-23 MED ORDER — SODIUM CHLORIDE 0.9 % IV BOLUS
500.0000 mL | Freq: Once | INTRAVENOUS | Status: AC
  Administered 2020-06-23: 500 mL via INTRAVENOUS

## 2020-06-23 MED ORDER — TICAGRELOR 90 MG PO TABS
90.0000 mg | ORAL_TABLET | Freq: Two times a day (BID) | ORAL | Status: DC
  Administered 2020-06-23 – 2020-06-28 (×10): 90 mg via ORAL
  Filled 2020-06-23 (×10): qty 1

## 2020-06-23 MED ORDER — INSULIN ASPART 100 UNIT/ML IJ SOLN
0.0000 [IU] | Freq: Three times a day (TID) | INTRAMUSCULAR | Status: DC
  Administered 2020-06-24 (×2): 2 [IU] via SUBCUTANEOUS
  Administered 2020-06-24 – 2020-06-25 (×3): 3 [IU] via SUBCUTANEOUS
  Administered 2020-06-25: 2 [IU] via SUBCUTANEOUS
  Administered 2020-06-26: 5 [IU] via SUBCUTANEOUS
  Administered 2020-06-26: 8 [IU] via SUBCUTANEOUS

## 2020-06-23 MED ORDER — MECLIZINE HCL 25 MG PO TABS: 25.0000 mg | ORAL_TABLET | Freq: Two times a day (BID) | ORAL | Status: DC | PRN

## 2020-06-23 MED ORDER — ENOXAPARIN SODIUM 30 MG/0.3ML IJ SOSY
30.0000 mg | PREFILLED_SYRINGE | INTRAMUSCULAR | Status: DC
  Administered 2020-06-23 – 2020-06-26 (×4): 30 mg via SUBCUTANEOUS
  Filled 2020-06-23 (×4): qty 0.3

## 2020-06-23 MED ORDER — MAGNESIUM OXIDE -MG SUPPLEMENT 400 (240 MG) MG PO TABS
400.0000 mg | ORAL_TABLET | Freq: Every day | ORAL | Status: DC
  Administered 2020-06-23 – 2020-06-28 (×6): 400 mg via ORAL
  Filled 2020-06-23 (×6): qty 1

## 2020-06-23 NOTE — ED Triage Notes (Addendum)
Pt brought to ED via EMS from [place] for fall that occurred at unknown time last night. EMS reports patient tripped over clutter in home and has been in floor since she fell. Pt alert and oriented to self and place on arrival to ED, endorses pain all over. Pt incontinent of urine in ED, reports frequent urination. No obvious external injuries present, no blood thinner use per EMS.  EMS v/s: 150/90 92 HR 98% 2L [place] baseline 220 cbg 20 RR

## 2020-06-23 NOTE — ED Provider Notes (Signed)
Care assumed from [place] at shift change, please see her note for full details, but in brief Andrea Kirk is a 80 y.o. female brought in by EMS after she was found on the floor, last seen well last night, per family patient is more confused than usual and not at baseline.  Complaining of pain all over and urinary frequency.  Plan: Lab work and imaging pending at shift change, patient will need admission regardless for confusion and altered mental status.   BP (!) 188/106   Pulse 97   Temp 99.7 F (37.6 C) (Rectal)   Resp 18   Ht 5\' 5"  (1.651 m)   Wt 81.6 kg   SpO2 100%   BMI 29.94 kg/m    ED Course/Procedures   Labs Reviewed  CBC WITH DIFFERENTIAL/PLATELET - Abnormal; Notable for the following components:      Result Value   RBC 2.78 (*)    Hemoglobin 10.0 (*)    HCT 30.9 (*)    MCV 111.2 (*)    MCH 36.0 (*)    Abs Immature Granulocytes 0.10 (*)    All other components within normal limits  COMPREHENSIVE METABOLIC PANEL - Abnormal; Notable for the following components:   CO2 17 (*)    Glucose, Bld 207 (*)    Calcium 10.4 (*)    Albumin 3.0 (*)    AST 71 (*)    ALT 54 (*)    Alkaline Phosphatase 164 (*)    Total Bilirubin 1.6 (*)    All other components within normal limits  URINALYSIS, ROUTINE W REFLEX MICROSCOPIC - Abnormal; Notable for the following components:   APPearance HAZY (*)    Hgb urine dipstick MODERATE (*)    Ketones, ur 20 (*)    Protein, ur 30 (*)    Leukocytes,Ua LARGE (*)    WBC, UA >50 (*)    Bacteria, UA RARE (*)    All other components within normal limits  CK - Abnormal; Notable for the following components:   Total CK 436 (*)    All other components within normal limits  CBG MONITORING, ED - Abnormal; Notable for the following components:   Glucose-Capillary 192 (*)    All other components within normal limits  RESP PANEL BY RT-PCR (FLU A&B, COVID) [id number]  URINE CULTURE  LACTIC ACID, PLASMA  HEMOGLOBIN A1C  BLOOD GAS,  VENOUS  CK  COMPREHENSIVE METABOLIC PANEL   DG Chest 2 View  Result Date: 06/23/2020 CLINICAL DATA:  Trip and fall at home. EXAM: CHEST - 2 VIEW COMPARISON:  04/16/2020 FINDINGS: Post median sternotomy and CABG. Upper normal heart size with stable mediastinal contours. Aortic atherosclerosis. No pneumothorax, acute airspace disease, pleural effusion or pulmonary edema. Mild right greater than left basilar atelectasis. Broad-based scoliotic curvature of the thoracic spine without acute osseous abnormalities. The bones are under mineralized. IMPRESSION: 1. No acute chest findings. 2. Mild right greater than left basilar atelectasis. Electronically Signed   By: Keith Rake M.D.   On: 06/23/2020 16:11   DG Pelvis 1-2 Views  Result Date: 06/23/2020 CLINICAL DATA:  Trip and fall at home. EXAM: PELVIS - 1-2 VIEW COMPARISON:  None. FINDINGS: The cortical margins of the bony pelvis are intact. No fracture. Pubic symphysis and sacroiliac joints are congruent. Both femoral heads are well-seated in the respective acetabula. Age related degenerative spurring of the acetabula. Surgical hardware in the lower lumbar spine is partially included. IMPRESSION: No fracture of the pelvis. Electronically Signed  By: Keith Rake M.D.   On: 06/23/2020 16:12   CT Head Wo Contrast  Result Date: 06/23/2020 CLINICAL DATA:  Head trauma, minor (Age >= 65y) Tripped at home leading to fall. EXAM: CT HEAD WITHOUT CONTRAST TECHNIQUE: Contiguous axial images were obtained from the base of the skull through the vertex without intravenous contrast. COMPARISON:  Head CT and brain MRI 04/16/2020. FINDINGS: Brain: No definite correlate for the small acute infarct in the left temporal lobe on prior MRI. No intracranial hemorrhage, mass effect, or midline shift. No hydrocephalus. The basilar cisterns are patent. Remote left cerebellar infarct again seen. No evidence of territorial infarct or acute ischemia. No extra-axial or  intracranial fluid collection. Vascular: Atherosclerosis of skullbase vasculature without hyperdense vessel or abnormal calcification. Skull: No fracture or focal lesion. Sinuses/Orbits: Mucosal thickening of ethmoid air cells fluid level in left side of sphenoid sinus is new from prior exam, there is no increased fluid density to suggest hemosinus. No visualized fracture. Mastoid air cells are clear. Acute orbital findings Other: None. IMPRESSION: 1. No acute intracranial abnormality. No skull fracture. 2. Remote left cerebellar infarct. No definite CT correlate for the recent small left temporal infarct on MRI. Electronically Signed   By: Keith Rake M.D.   On: 06/23/2020 16:06   CT Cervical Spine Wo Contrast  Result Date: 06/23/2020 CLINICAL DATA:  Neck trauma (Age >= 65y) Trip and fall at home. EXAM: CT CERVICAL SPINE WITHOUT CONTRAST TECHNIQUE: Multidetector CT imaging of the cervical spine was performed without intravenous contrast. Multiplanar CT image reconstructions were also generated. COMPARISON:  04/16/2020 FINDINGS: Alignment: No traumatic subluxation. Again seen straightening of normal lordosis and minimal anterolisthesis of C7 on T1. Skull base and vertebrae: No acute fracture. Vertebral body heights are maintained. The dens and skull base are intact. Stable C1-C2 degenerative change with pannus formation. Soft tissues and spinal canal: No prevertebral fluid or swelling. No visible canal hematoma. Scattered surgical clips in the neck soft tissues. 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 There is mild multilevel facet hypertrophy. Upper chest: No acute or unexpected findings Other: None. IMPRESSION: 1. No acute fracture or subluxation of the cervical spine. 2. Stable degenerative change from prior exam. Again seen loss of normal cervical lordosis. Electronically Signed   By: Keith Rake M.D.   On: 06/23/2020 16:10      Procedures  MDM   I have independently ordered, reviewed and interpreted all labs and imaging:  CBC: No leukocytosis, hemoglobin stable and at baseline CMP: Glucose 207, CO2 17, no significant electrolyte derangements, normal renal function, mildly elevated LFTs CK: Mildly elevated at 436, 500 cc bolus given. UA: Large leukocytes and greater than 50 WBCs concerning for infection, rare bacteria present, culture sent, suspect this is likely contributing to patient's confusion, started on IV Rocephin  COVID/flu screening: Negative  CTs of the head and cervical spine without evidence of acute traumatic injury. Chest and pelvis films unremarkable.  Attempted to call patient's brother twice to discuss plan for admission, but calls went to voicemail.  [place] discussed with Dr. Roosevelt Locks with [facility] who will see and admit the patient.  Final diagnoses:  Acute UTI  Encephalopathy          Jacqlyn Larsen, PA-C 06/23/20 1844    Lorelle Gibbs, DO 06/23/20 2102

## 2020-06-23 NOTE — ED Notes (Signed)
Admitting MD at bedside.

## 2020-06-23 NOTE — ED Notes (Signed)
Patient transported to X-ray 

## 2020-06-23 NOTE — ED Provider Notes (Signed)
[facility] EMERGENCY DEPARTMENT Provider Note   CSN: [id number] Arrival date & time: 06/23/20  1403     History No chief complaint on file.   Andrea Kirk is a 80 y.o. female presenting for evaluation after a fall.  Level 5 caveat due to altered mental status.  Patient states she hurts everywhere.  She reports urinary frequency. she is unable to provide any other significant history including what happened.  Additional history obtained from EMS.  Per EMS, patient was found on the floor this afternoon, last seen last night.  There was clutter all over the home per EMS.   Additional history obtained from patient's brother, Dr. Jori Moll Doggett.  He states patient has been in good health until today.  She is tracking vital signs including oxygen, blood pressure, heart rate, temperature.  She is normally alert and oriented without confusion.  He states when he talked to patient earlier today, she appeared to be slurring her words which is not her baseline.  HPI     Past Medical History:  Diagnosis Date   Acute kidney injury ([place]) 08/05/2016   Aftercare following surgery of the circulatory system, NEC 05/11/2013   AKI (acute kidney injury) ([place]) 08/04/2016   Anxiety    takes Celexa daily   ARF (acute renal failure) ([place]) 11/07/2016   Asymptomatic stenosis of right carotid artery 03/22/2016   Back pain    occasionally   Carotid artery disease ([place]) 04/16/2013   Carotid artery occlusion    Carotid stenosis 11/16/2013   Cataract    left and immature   Coronary artery disease    Coronary atherosclerosis of native coronary artery 03/11/2013   S/p CABG in 1997    Depression    Diabetes mellitus    takes Metformin and Glipizide daily   Diabetes mellitus ([place]) 05/02/2015   Dizziness    takes Meclizine daily as needed   Essential hypertension, benign 03/11/2013   GERD (gastroesophageal reflux disease)    takes Omeprazole daily as needed   Headache(784.0)    Hyperlipidemia     takes Atorvastatin daily   Hypertension    takes Carvedilol daily   Mixed hyperlipidemia 03/11/2013   Muscle spasm    takes Robaxin daily as needed   Nausea    takes Phenergan daily as needed   Occlusion and stenosis of carotid artery without mention of cerebral infarction 07/19/2011   Pneumonia    hx of-in high school   Restless leg    takes Requip daily as needed   Seasonal allergies    takes Claritin daily as needed and Afrin as needed   Shortness of breath    with exertion   Urinary urgency    UTI (urinary tract infection) 11/07/2016    Patient Active Problem List   Diagnosis Date Noted   Pulmonary hypertension ([place]) 04/20/2020   Controlled type 2 diabetes mellitus with hyperglycemia ([place]) 04/20/2020   Essential hypertension 04/20/2020   HLD (hyperlipidemia) 04/20/2020   Acute renal failure superimposed on stage 2 chronic kidney disease ([place]) 04/20/2020   Iron deficiency anemia 04/20/2020   Acute respiratory failure with hypoxia ([place]) 04/19/2020   Obese 04/19/2020   Chronic back pain 04/19/2020   Acute stroke due to ischemia [facility])    CVA (cerebral vascular accident) ([place]) 04/16/2020   Hyperkalemia 04/16/2020   Follow-up examination after eye surgery 04/22/2019   Macular retinoschisis, 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   Vitreomacular traction syndrome, 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   Depression 12/22/2017   Chronic  pain 12/22/2017   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   Falls 12/22/2017   Chronic diastolic CHF (congestive heart failure) ([place]) 12/22/2017   Hypoxia 12/21/2017   ARF (acute renal failure) ([place]) 11/07/2016   UTI (urinary tract infection) 11/07/2016   Acute kidney injury ([place]) 08/05/2016   AKI (acute kidney injury) ([place]) 08/04/2016   Frequent falls 07/16/2016   Hypertension 07/16/2016   Asymptomatic stenosis of right carotid artery 03/22/2016   Diabetes mellitus ([place]) 05/02/2015   Carotid stenosis 11/16/2013   Aftercare following surgery of the circulatory system, NEC  05/11/2013   Carotid artery disease ([place]) 04/16/2013   Coronary artery disease 03/11/2013   Essential hypertension, benign 03/11/2013   Hyperlipidemia 03/11/2013   Occlusion and stenosis of carotid artery without mention of cerebral infarction 07/19/2011    Past Surgical History:  Procedure Laterality Date   COLONOSCOPY WITH PROPOFOL N/A 03/10/2012   Procedure: COLONOSCOPY WITH PROPOFOL;  Surgeon: Garlan Fair, MD;  Location: [facility];  Service: Endoscopy;  Laterality: N/A;   CORNEAL TRANSPLANT Right    CORONARY ARTERY BYPASS GRAFT  1997   x 6   CORONARY ARTERY BYPASS GRAFT  Jan. 1997   ENDARTERECTOMY Left 04/16/2013   Procedure: Left Carotid Artery Endatarectomy with Resection of Redundant Internal Carotid Artery;  Surgeon: Rosetta Posner, MD;  Location: [facility];  Service: Vascular;  Laterality: Left;   ENDARTERECTOMY Right 03/22/2016   Procedure: RIGHT ENDARTERECTOMY CAROTID;  Surgeon: Rosetta Posner, MD;  Location: [facility] OR;  Service: Vascular;  Laterality: Right;   ESOPHAGOGASTRODUODENOSCOPY N/A 03/10/2012   Procedure: ESOPHAGOGASTRODUODENOSCOPY (EGD);  Surgeon: Garlan Fair, MD;  Location: [facility];  Service: Endoscopy;  Laterality: N/A;   EYE SURGERY  March 12, 2001   CORNEA TRANSPLANT Right eye   PATCH ANGIOPLASTY Right 03/22/2016   Procedure: PATCH ANGIOPLASTY;  Surgeon: Rosetta Posner, MD;  Location: [facility];  Service: Vascular;  Laterality: Right;   [place] SURGERY  march 2013   Back surgery   TONSILLECTOMY     TRIGGER FINGER RELEASE Left    thumb     OB History   No obstetric history on file.     Family History  Problem Relation Age of Onset   Heart attack Father    Heart disease Father 13       Before age of 20   Hyperlipidemia Father    Heart disease Brother        Heart dissease before age 37   Hyperlipidemia Brother    Cirrhosis Mother    Heart attack Paternal Grandfather    Heart disease Paternal Grandfather     Cancer Maternal Grandmother    Alcoholism Maternal Grandfather    Heart attack Paternal Grandmother     Social History   Tobacco Use   Smoking status: Never   Smokeless tobacco: Never  Vaping Use   Vaping Use: Never used  Substance Use Topics   Alcohol use: No    Alcohol/week: 0.0 standard drinks   Drug use: No    Home Medications Prior to Admission medications   Medication Sig Start Date End Date Taking? Authorizing Provider  acetaminophen (TYLENOL) 325 MG tablet Take 650 mg by mouth every 6 (six) hours as needed for moderate pain.    [provider]  amLODipine (NORVASC) 5 MG tablet Take 1 tablet (5 mg total) by mouth daily. 11/08/16   Bonnielee Haff, MD  Ascorbic Acid (VITAMIN C) 1000 MG  tablet Take 1,000 mg by mouth daily.    [provider]  aspirin EC 81 MG tablet Take 81 mg by mouth every evening.     [provider]  carvedilol (COREG) 25 MG tablet TAKE ONE TABLET BY MOUTH TWICE A DAY Patient taking differently: Take 25 mg by mouth 2 (two) times daily with a meal. 01/24/20   Jettie Booze, MD  citalopram (CELEXA) 20 MG tablet Take 20 mg by mouth every morning.     [provider]  Cyanocobalamin (B-12) 2500 MCG TABS Take 2,500 mcg by mouth daily.    [provider]  FERROUS SULFATE PO Take 325 mg by mouth daily.    [provider]  gabapentin (NEURONTIN) 300 MG capsule Take 300 mg by mouth 4 (four) times daily.  02/28/16   [provider]  glipiZIDE (GLUCOTROL XL) 5 MG 24 hr tablet Take 1 tablet (5 mg total) by mouth daily with breakfast. 04/21/20   Allie Bossier, MD  HYDROcodone-acetaminophen [facility]) 10-325 MG tablet Take 1 tablet by mouth 2 (two) times daily as needed for severe pain.  10/25/16   [provider]  Magnesium Oxide 250 MG TABS Take 250 mg by mouth daily.    [provider]  meclizine (ANTIVERT) 25 MG tablet Take 25 mg by mouth 2 (two) times daily as needed for dizziness.  For dizziness    [provider]  nitroGLYCERIN (NITROSTAT) 0.4 MG SL tablet Place 1 tablet (0.4 mg total) under the tongue every 5 (five) minutes as needed. Chest pain. 01/18/19   Jettie Booze, MD  pantoprazole (PROTONIX) 40 MG tablet Take 40 mg by mouth daily as needed (INDIGESTION).  01/10/15   [provider]  promethazine (PHENERGAN) 12.5 MG tablet Take 12.5 mg by mouth every 6 (six) hours as needed for nausea or vomiting.    [provider]  rosuvastatin (CRESTOR) 5 MG tablet Take 5 mg by mouth daily. Takes on Monday and Friday    [provider]  ticagrelor (BRILINTA) 90 MG TABS tablet Take 1 tablet (90 mg total) by mouth 2 (two) times daily. 04/20/20   Allie Bossier, MD    Allergies    Codeine, Latex, Morphine and related, Cyclobenzaprine, Methocarbamol, and Morphine  Review of Systems   Review of Systems  Unable to perform ROS: Mental status change   Physical Exam Updated Vital Signs BP (!) 165/84   Pulse 90   Temp 99.7 F (37.6 C) (Rectal)   Resp 12   Ht 5\' 5"  (1.651 m)   Wt 81.6 kg   SpO2 100%   BMI 29.94 kg/m   Physical Exam Vitals and nursing note reviewed.  Constitutional:      General: She is not in acute distress.    Appearance: Normal appearance. She is ill-appearing.     Comments: Appears ill  HENT:     Head: Normocephalic and atraumatic.  Eyes:     Conjunctiva/sclera: Conjunctivae normal.     Pupils: Pupils are equal, round, and reactive to light.  Cardiovascular:     Rate and Rhythm: Normal rate and regular rhythm.     Pulses: Normal pulses.  Pulmonary:     Effort: Pulmonary effort is normal. No respiratory distress.     Breath sounds: Normal breath sounds. No wheezing.     Comments: Speaking in full sentences.  Clear lung sounds in all fields. Abdominal:     General: There is no distension.  Palpations: Abdomen is soft. There is no mass.     Tenderness: There is no abdominal tenderness. There is no  guarding or rebound.  Musculoskeletal:        General: Normal range of motion.     Cervical back: Normal range of motion and neck supple.     Comments: No obvious deformity of upper or lower extremities.  Radial pedal pulses 2+ bilaterally.  Patient able to lift legs independently off the bed without significant pain or discomfort.  Able to go from laying to sitting without pain.  No TTP of midline spine.  Skin:    General: Skin is warm and dry.     Capillary Refill: Capillary refill takes less than 2 seconds.  Neurological:     Mental Status: She is alert and oriented to person, place, and time. She is confused.     Comments: Alert to person, place, and time, but not event. Pt is clearly confused. No obvious slurring noted on exam.   Psychiatric:        Mood and Affect: Mood and affect normal.        Speech: Speech normal.        Behavior: Behavior normal.    ED Results / Procedures / Treatments   Labs (all labs ordered are listed, but only abnormal results are displayed) Labs Reviewed  URINE CULTURE  CBC WITH DIFFERENTIAL/PLATELET  COMPREHENSIVE METABOLIC PANEL  URINALYSIS, ROUTINE W REFLEX MICROSCOPIC  CK  LACTIC ACID, PLASMA  LACTIC ACID, PLASMA  CBG MONITORING, ED    EKG None  Radiology No results found.  Procedures Procedures   Medications Ordered in ED Medications - No data to display  ED Course  I have reviewed the triage vital signs and the nursing notes.  Pertinent labs & imaging results that were available during my care of the patient were reviewed by me and considered in my medical decision making (see chart for details).    MDM Rules/Calculators/A&P                          Patient presenting for evaluation after a fall and for evaluation of confusion.  On exam, patient appears ill.  She is confused from her baseline.  Unknown if patient fell first had a metabolic event that caused her to fall.  Consider infection versus anemia versus stroke versus  ICH versus cardiac event.  Will obtain labs, urine, chest x-ray, CT head.  As patient was laying on the floor for an unknown amount of time, CK ordered.  Pelvis and chest x-ray is as well as CT head and neck.   Pt signed out to Windell Moment, PA-C for f/u on labs and imaging. Pt will need to be admitted for confusion/encephalopathy.   Final Clinical Impression(s) / ED Diagnoses Final diagnoses:  None    Rx / DC Orders ED Discharge Orders     None        Franchot Heidelberg, PA-C 06/23/20 1516    Blanchie Dessert, MD 06/24/20 1048

## 2020-06-23 NOTE — H&P (Signed)
History and Physical    Andrea Kirk DJM:[id number] DOB: [birth year] DOA: 06/23/2020  PCP: Aura Dials, MD (Confirm with patient/family/NH records and if not entered, this has to be entered at [facility] point of entry) Patient coming from: Independent living  I have personally briefly reviewed patient's old medical records in [place]  Chief Complaint: I fell  HPI: Andrea Kirk is a 80 y.o. female with medical history significant of CVA, HTN, IIDM, diabetic neuropathy, chronic ambulation dysfunction with shuffling gait, vertigo on as needed Antivert, CAD status post CABG, presented with fall and confusion.  Patient appears to be more awake and provided some history however still somewhat confused.  Patient remember last night, she was at home and standing and started to feel vertigo and loss of balance and fell down.  Feeling weak and unable to support herself she remained on the floor through the night and was found by facility staff in the morning.  She followed complaining about burning sensation with urination for last 2 days, denies any cough no diarrhea.  No fever or chills.  Patient's brother reported that patient has had 3 falls in the last 22-month, at baseline patient has unsteady gait and tends to fall on uneven ground.  It appears that the patient was trying to get out of bed at night but on the right side of the bed and fell and trapped between her bed and the wall, and unable to get out.  Per the report of the patient worsening of her ambulation dysfunction/shuffling gait lately, and brother recommend she keep up using the cane and instead use the walker.  ED Course: CT head and neck negative for acute findings.  CK 400. UA showed pyuria.  Review of Systems: Unable to perform, patient still somewhat confused.  Past Medical History:  Diagnosis Date   Acute kidney injury ([place]) 08/05/2016   Aftercare following surgery of the circulatory system, NEC 05/11/2013   AKI (acute kidney  injury) ([place]) 08/04/2016   Anxiety    takes Celexa daily   ARF (acute renal failure) ([place]) 11/07/2016   Asymptomatic stenosis of right carotid artery 03/22/2016   Back pain    occasionally   Carotid artery disease ([place]) 04/16/2013   Carotid artery occlusion    Carotid stenosis 11/16/2013   Cataract    left and immature   Coronary artery disease    Coronary atherosclerosis of native coronary artery 03/11/2013   S/p CABG in 1997    Depression    Diabetes mellitus    takes Metformin and Glipizide daily   Diabetes mellitus ([place]) 05/02/2015   Dizziness    takes Meclizine daily as needed   Essential hypertension, benign 03/11/2013   GERD (gastroesophageal reflux disease)    takes Omeprazole daily as needed   Headache(784.0)    Hyperlipidemia    takes Atorvastatin daily   Hypertension    takes Carvedilol daily   Mixed hyperlipidemia 03/11/2013   Muscle spasm    takes Robaxin daily as needed   Nausea    takes Phenergan daily as needed   Occlusion and stenosis of carotid artery without mention of cerebral infarction 07/19/2011   Pneumonia    hx of-in high school   Restless leg    takes Requip daily as needed   Seasonal allergies    takes Claritin daily as needed and Afrin as needed   Shortness of breath    with exertion   Urinary urgency    UTI (urinary tract  infection) 11/07/2016    Past Surgical History:  Procedure Laterality Date   COLONOSCOPY WITH PROPOFOL N/A 03/10/2012   Procedure: COLONOSCOPY WITH PROPOFOL;  Surgeon: Garlan Fair, MD;  Location: [facility];  Service: Endoscopy;  Laterality: N/A;   CORNEAL TRANSPLANT Right    CORONARY ARTERY BYPASS GRAFT  1997   x 6   CORONARY ARTERY BYPASS GRAFT  Jan. 1997   ENDARTERECTOMY Left 04/16/2013   Procedure: Left Carotid Artery Endatarectomy with Resection of Redundant Internal Carotid Artery;  Surgeon: Rosetta Posner, MD;  Location: [facility];  Service: Vascular;  Laterality: Left;   ENDARTERECTOMY Right 03/22/2016   Procedure: RIGHT  ENDARTERECTOMY CAROTID;  Surgeon: Rosetta Posner, MD;  Location: [facility] OR;  Service: Vascular;  Laterality: Right;   ESOPHAGOGASTRODUODENOSCOPY N/A 03/10/2012   Procedure: ESOPHAGOGASTRODUODENOSCOPY (EGD);  Surgeon: Garlan Fair, MD;  Location: [facility];  Service: Endoscopy;  Laterality: N/A;   EYE SURGERY  March 12, 2001   CORNEA TRANSPLANT Right eye   PATCH ANGIOPLASTY Right 03/22/2016   Procedure: PATCH ANGIOPLASTY;  Surgeon: Rosetta Posner, MD;  Location: [place];  Service: Vascular;  Laterality: Right;   [facility]  march 2013   Back surgery   TONSILLECTOMY     TRIGGER FINGER RELEASE Left    thumb     reports that she has never smoked. She has never used smokeless tobacco. She reports that she does not drink alcohol and does not use drugs.  Allergies  Allergen Reactions   Codeine Other (See Comments)    Abnormal behavior Other reaction(s): Unknown   Latex Other (See Comments)    tears skin Other reaction(s): Rash   Morphine And Related Other (See Comments)    Affects BP and blood sugar.   Cyclobenzaprine     MADE PT SICK- pt unsure if med was cyclobenzaprine or methocarbamol    Methocarbamol Other (See Comments)    MADE PT SICK- pt unsure if med was cyclobenzaprine or methocarbamol    Morphine     Other reaction(s): Unknown    Family History  Problem Relation Age of Onset   Heart attack Father    Heart disease Father 47       Before age of 71   Hyperlipidemia Father    Heart disease Brother        Heart dissease before age 21   Hyperlipidemia Brother    Cirrhosis Mother    Heart attack Paternal Grandfather    Heart disease Paternal Grandfather    Cancer Maternal Grandmother    Alcoholism Maternal Grandfather    Heart attack Paternal Grandmother      Prior to Admission medications   Medication Sig Start Date End Date Taking? Authorizing Provider  acetaminophen (TYLENOL) 325 MG tablet Take 650 mg by mouth every 6 (six)  hours as needed for moderate pain.    [provider]  amLODipine (NORVASC) 5 MG tablet Take 1 tablet (5 mg total) by mouth daily. 11/08/16   Bonnielee Haff, MD  Ascorbic Acid (VITAMIN C) 1000 MG tablet Take 1,000 mg by mouth daily.    [provider]  aspirin EC 81 MG tablet Take 81 mg by mouth every evening.     [provider]  carvedilol (COREG) 25 MG tablet TAKE ONE TABLET BY MOUTH TWICE A DAY Patient taking differently: Take 25 mg by mouth 2 (two) times daily with a meal. 01/24/20   Jettie Booze,  MD  citalopram (CELEXA) 20 MG tablet Take 20 mg by mouth every morning.     [provider]  Cyanocobalamin (B-12) 2500 MCG TABS Take 2,500 mcg by mouth daily.    [provider]  FERROUS SULFATE PO Take 325 mg by mouth daily.    [provider]  gabapentin (NEURONTIN) 300 MG capsule Take 300 mg by mouth 4 (four) times daily.  02/28/16   [provider]  glipiZIDE (GLUCOTROL XL) 5 MG 24 hr tablet Take 1 tablet (5 mg total) by mouth daily with breakfast. 04/21/20   Allie Bossier, MD  HYDROcodone-acetaminophen [facility]) 10-325 MG tablet Take 1 tablet by mouth 2 (two) times daily as needed for severe pain.  10/25/16   [provider]  Magnesium Oxide 250 MG TABS Take 250 mg by mouth daily.    [provider]  meclizine (ANTIVERT) 25 MG tablet Take 25 mg by mouth 2 (two) times daily as needed for dizziness. For dizziness    [provider]  nitroGLYCERIN (NITROSTAT) 0.4 MG SL tablet Place 1 tablet (0.4 mg total) under the tongue every 5 (five) minutes as needed. Chest pain. 01/18/19   Jettie Booze, MD  pantoprazole (PROTONIX) 40 MG tablet Take 40 mg by mouth daily as needed (INDIGESTION).  01/10/15   [provider]  promethazine (PHENERGAN) 12.5 MG tablet Take 12.5 mg by mouth every 6 (six) hours as needed for nausea or vomiting.    [provider]  rosuvastatin (CRESTOR) 5 MG tablet Take  5 mg by mouth daily. Takes on Monday and Friday    [provider]  ticagrelor (BRILINTA) 90 MG TABS tablet Take 1 tablet (90 mg total) by mouth 2 (two) times daily. 04/20/20   Allie Bossier, MD    Physical Exam: Vitals:   06/23/20 1645 06/23/20 1700 06/23/20 1715 06/23/20 1730  BP: (!) 182/94 (!) 157/70 (!) 172/75 (!) 188/106  Pulse: 91 71 77 97  Resp: 16 15 18 18   Temp:      TempSrc:      SpO2: 100% 100% 100% 100%  Weight:      Height:        Constitutional: NAD, calm, comfortable Vitals:   06/23/20 1645 06/23/20 1700 06/23/20 1715 06/23/20 1730  BP: (!) 182/94 (!) 157/70 (!) 172/75 (!) 188/106  Pulse: 91 71 77 97  Resp: 16 15 18 18   Temp:      TempSrc:      SpO2: 100% 100% 100% 100%  Weight:      Height:       Eyes: PERRL, lids and conjunctivae normal ENMT: Mucous membranes are moist. Posterior pharynx clear of any exudate or lesions.Normal dentition.  Neck: normal, supple, no masses, no thyromegaly Respiratory: clear to auscultation bilaterally, no wheezing, no crackles. Normal respiratory effort. No accessory muscle use.  Cardiovascular: Regular rate and rhythm, no murmurs / rubs / gallops. No extremity edema. 2+ pedal pulses. No carotid bruits.  Abdomen: no tenderness, no masses palpated. No hepatosplenomegaly. Bowel sounds positive.  Musculoskeletal: no clubbing / cyanosis. No joint deformity upper and lower extremities. Good ROM, no contractures. Normal muscle tone.  Skin: no rashes, lesions, ulcers. No induration Neurologic: CN 2-12 grossly intact. Sensation intact, DTR normal. Strength 5/5 in all 4.  Psychiatric: Awake, confused    Labs on Admission: I have personally reviewed following labs and imaging studies  CBC: Recent Labs  Lab 06/23/20 1500  WBC 6.9  NEUTROABS 5.2  HGB  10.0*  HCT 30.9*  MCV 111.2*  PLT 951   Basic Metabolic Panel: Recent Labs  Lab 06/23/20 1500  NA 139  K 4.3  CL 111  CO2 17*  GLUCOSE 207*  BUN 19  CREATININE  0.65  CALCIUM 10.4*   GFR: Estimated Creatinine Clearance: 60.1 mL/min (by C-G formula based on SCr of 0.65 mg/dL). Liver Function Tests: Recent Labs  Lab 06/23/20 1500  AST 71*  ALT 54*  ALKPHOS 164*  BILITOT 1.6*  PROT 7.2  ALBUMIN 3.0*   No results for input(s): LIPASE, AMYLASE in the last 168 hours. No results for input(s): AMMONIA in the last 168 hours. Coagulation Profile: No results for input(s): INR, PROTIME in the last 168 hours. Cardiac Enzymes: Recent Labs  Lab 06/23/20 1500  CKTOTAL 436*   BNP (last 3 results) No results for input(s): PROBNP in the last 8760 hours. HbA1C: No results for input(s): HGBA1C in the last 72 hours. CBG: Recent Labs  Lab 06/23/20 1452  GLUCAP 192*   Lipid Profile: No results for input(s): CHOL, HDL, LDLCALC, TRIG, CHOLHDL, LDLDIRECT in the last 72 hours. Thyroid Function Tests: No results for input(s): TSH, T4TOTAL, FREET4, T3FREE, THYROIDAB in the last 72 hours. Anemia Panel: No results for input(s): VITAMINB12, FOLATE, FERRITIN, TIBC, IRON, RETICCTPCT in the last 72 hours. Urine analysis:    Component Value Date/Time   COLORURINE YELLOW 06/23/2020 1453   APPEARANCEUR HAZY (A) 06/23/2020 1453   LABSPEC 1.014 06/23/2020 1453   PHURINE 6.0 06/23/2020 1453   GLUCOSEU NEGATIVE 06/23/2020 1453   HGBUR MODERATE (A) 06/23/2020 1453   BILIRUBINUR NEGATIVE 06/23/2020 1453   KETONESUR 20 (A) 06/23/2020 1453   PROTEINUR 30 (A) 06/23/2020 1453   UROBILINOGEN 1.0 06/17/2014 2145   NITRITE NEGATIVE 06/23/2020 1453   LEUKOCYTESUR LARGE (A) 06/23/2020 1453    Radiological Exams on Admission: DG Chest 2 View  Result Date: 06/23/2020 CLINICAL DATA:  Trip and fall at home. EXAM: CHEST - 2 VIEW COMPARISON:  04/16/2020 FINDINGS: Post median sternotomy and CABG. Upper normal heart size with stable mediastinal contours. Aortic atherosclerosis. No pneumothorax, acute airspace disease, pleural effusion or pulmonary edema. Mild right greater  than left basilar atelectasis. Broad-based scoliotic curvature of the thoracic spine without acute osseous abnormalities. The bones are under mineralized. IMPRESSION: 1. No acute chest findings. 2. Mild right greater than left basilar atelectasis. Electronically Signed   By: Keith Rake M.D.   On: 06/23/2020 16:11   DG Pelvis 1-2 Views  Result Date: 06/23/2020 CLINICAL DATA:  Trip and fall at home. EXAM: PELVIS - 1-2 VIEW COMPARISON:  None. FINDINGS: The cortical margins of the bony pelvis are intact. No fracture. Pubic symphysis and sacroiliac joints are congruent. Both femoral heads are well-seated in the respective acetabula. Age related degenerative spurring of the acetabula. Surgical hardware in the lower lumbar spine is partially included. IMPRESSION: No fracture of the pelvis. Electronically Signed   By: Keith Rake M.D.   On: 06/23/2020 16:12   CT Head Wo Contrast  Result Date: 06/23/2020 CLINICAL DATA:  Head trauma, minor (Age >= 65y) Tripped at home leading to fall. EXAM: CT HEAD WITHOUT CONTRAST TECHNIQUE: Contiguous axial images were obtained from the base of the skull through the vertex without intravenous contrast. COMPARISON:  Head CT and brain MRI 04/16/2020. FINDINGS: Brain: No definite correlate for the small acute infarct in the left temporal lobe on prior MRI. No intracranial hemorrhage, mass effect, or midline shift. No hydrocephalus. The basilar cisterns are  patent. Remote left cerebellar infarct again seen. No evidence of territorial infarct or acute ischemia. No extra-axial or intracranial fluid collection. Vascular: Atherosclerosis of skullbase vasculature without hyperdense vessel or abnormal calcification. Skull: No fracture or focal lesion. Sinuses/Orbits: Mucosal thickening of ethmoid air cells fluid level in left side of sphenoid sinus is new from prior exam, there is no increased fluid density to suggest hemosinus. No visualized fracture. Mastoid air cells are clear.  Acute orbital findings Other: None. IMPRESSION: 1. No acute intracranial abnormality. No skull fracture. 2. Remote left cerebellar infarct. No definite CT correlate for the recent small left temporal infarct on MRI. Electronically Signed   By: Keith Rake M.D.   On: 06/23/2020 16:06   CT Cervical Spine Wo Contrast  Result Date: 06/23/2020 CLINICAL DATA:  Neck trauma (Age >= 65y) Trip and fall at home. EXAM: CT CERVICAL SPINE WITHOUT CONTRAST TECHNIQUE: Multidetector CT imaging of the cervical spine was performed without intravenous contrast. Multiplanar CT image reconstructions were also generated. COMPARISON:  04/16/2020 FINDINGS: Alignment: No traumatic subluxation. Again seen straightening of normal lordosis and minimal anterolisthesis of C7 on T1. Skull base and vertebrae: No acute fracture. Vertebral body heights are maintained. The dens and skull base are intact. Stable C1-C2 degenerative change with pannus formation. Soft tissues and spinal canal: No prevertebral fluid or swelling. No visible canal hematoma. Scattered surgical clips in the neck soft tissues. 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 There is mild multilevel facet hypertrophy. Upper chest: No acute or unexpected findings Other: None. IMPRESSION: 1. No acute fracture or subluxation of the cervical spine. 2. Stable degenerative change from prior exam. Again seen loss of normal cervical lordosis. Electronically Signed   By: Keith Rake M.D.   On: 06/23/2020 16:10    EKG: Independently reviewed. Sinus, no acute ST-T changes.  Assessment/Plan Active Problems:   Syncope, vasovagal   Syncope  (please populate well all problems here in Problem List. (For example, if patient is on BP meds at home and you resume or decide to hold them, it is a problem that needs to be her. Same for CAD, COPD, HLD and so on)  Syncope and fall -Probably secondary to her vertigo and worsening  of ambulation dysfunction -Check orthostatic vital signs, PT evaluation. -Echo done recently. -Long discussion with patient brother over the phone who is also physician, who recommend that it appears that but independent living probably no longer appropriate for the patient, which I agreed.  UTI -Ceftriaxone and urine culture.  Mild rhabdomyolysis -Recheck CK tomorrow  Elevated LFTs -Probably related to rhabdomyolysis, no RUQ pains -Repeat LFT tomorrow  Chronic hypoxic respite failure -This was found during last admission when patient had a stroke.  However no clear etiology was found. -No hypoxia today, will check VBG to rule out CO2 retention. -Brother reported patient has weaned off oxygen since discharge from hospital last time.  IIDM -Continue sulfonylurea  HTN uncontrolled -Obviously missed her morning dose of HTN medication, will resume all her BP meds this afternoon.  DM neuropathy -Gabapentin  CVA -Continue aspirin and Brilinta recommend -Hold Statin  DVT prophylaxis: Lovenox Code Status: Full code Family Communication: Brother over the phone Disposition Plan: Expect less than 2 midnight hospital stay Consults called: None Admission status: Tele obs   Lequita Halt MD [facility] Pager [phone]  06/23/2020, 5:42 PM

## 2020-06-24 DIAGNOSIS — B9620 Unspecified Escherichia coli [E. coli] as the cause of diseases classified elsewhere: Secondary | ICD-10-CM | POA: Diagnosis present

## 2020-06-24 DIAGNOSIS — Z66 Do not resuscitate: Secondary | ICD-10-CM | POA: Diagnosis present

## 2020-06-24 DIAGNOSIS — Z885 Allergy status to narcotic agent status: Secondary | ICD-10-CM | POA: Diagnosis not present

## 2020-06-24 DIAGNOSIS — Z8249 Family history of ischemic heart disease and other diseases of the circulatory system: Secondary | ICD-10-CM | POA: Diagnosis not present

## 2020-06-24 DIAGNOSIS — R531 Weakness: Secondary | ICD-10-CM | POA: Diagnosis present

## 2020-06-24 DIAGNOSIS — Z20822 Contact with and (suspected) exposure to covid-19: Secondary | ICD-10-CM | POA: Diagnosis present

## 2020-06-24 DIAGNOSIS — Z888 Allergy status to other drugs, medicaments and biological substances status: Secondary | ICD-10-CM | POA: Diagnosis not present

## 2020-06-24 DIAGNOSIS — E1140 Type 2 diabetes mellitus with diabetic neuropathy, unspecified: Secondary | ICD-10-CM | POA: Diagnosis present

## 2020-06-24 DIAGNOSIS — Y92009 Unspecified place in unspecified non-institutional (private) residence as the place of occurrence of the external cause: Secondary | ICD-10-CM | POA: Diagnosis not present

## 2020-06-24 DIAGNOSIS — Y848 Other medical procedures as the cause of abnormal reaction of the patient, or of later complication, without mention of misadventure at the time of the procedure: Secondary | ICD-10-CM | POA: Diagnosis present

## 2020-06-24 DIAGNOSIS — G8929 Other chronic pain: Secondary | ICD-10-CM | POA: Diagnosis present

## 2020-06-24 DIAGNOSIS — Z91040 Latex allergy status: Secondary | ICD-10-CM | POA: Diagnosis not present

## 2020-06-24 DIAGNOSIS — I951 Orthostatic hypotension: Secondary | ICD-10-CM | POA: Diagnosis present

## 2020-06-24 DIAGNOSIS — I2510 Atherosclerotic heart disease of native coronary artery without angina pectoris: Secondary | ICD-10-CM | POA: Diagnosis present

## 2020-06-24 DIAGNOSIS — I110 Hypertensive heart disease with heart failure: Secondary | ICD-10-CM | POA: Diagnosis present

## 2020-06-24 DIAGNOSIS — G2581 Restless legs syndrome: Secondary | ICD-10-CM | POA: Diagnosis present

## 2020-06-24 DIAGNOSIS — N390 Urinary tract infection, site not specified: Secondary | ICD-10-CM | POA: Diagnosis present

## 2020-06-24 DIAGNOSIS — T82848A Pain from vascular prosthetic devices, implants and grafts, initial encounter: Secondary | ICD-10-CM | POA: Diagnosis present

## 2020-06-24 DIAGNOSIS — D333 Benign neoplasm of cranial nerves: Secondary | ICD-10-CM | POA: Diagnosis present

## 2020-06-24 DIAGNOSIS — Z83438 Family history of other disorder of lipoprotein metabolism and other lipidemia: Secondary | ICD-10-CM | POA: Diagnosis not present

## 2020-06-24 DIAGNOSIS — M6282 Rhabdomyolysis: Secondary | ICD-10-CM | POA: Diagnosis present

## 2020-06-24 DIAGNOSIS — R55 Syncope and collapse: Secondary | ICD-10-CM | POA: Diagnosis present

## 2020-06-24 DIAGNOSIS — Z951 Presence of aortocoronary bypass graft: Secondary | ICD-10-CM | POA: Diagnosis not present

## 2020-06-24 DIAGNOSIS — I5032 Chronic diastolic (congestive) heart failure: Secondary | ICD-10-CM | POA: Diagnosis present

## 2020-06-24 DIAGNOSIS — Z8673 Personal history of transient ischemic attack (TIA), and cerebral infarction without residual deficits: Secondary | ICD-10-CM | POA: Diagnosis not present

## 2020-06-24 DIAGNOSIS — W010XXA Fall on same level from slipping, tripping and stumbling without subsequent striking against object, initial encounter: Secondary | ICD-10-CM | POA: Diagnosis present

## 2020-06-24 DIAGNOSIS — E782 Mixed hyperlipidemia: Secondary | ICD-10-CM | POA: Diagnosis present

## 2020-06-24 DIAGNOSIS — F32A Depression, unspecified: Secondary | ICD-10-CM | POA: Diagnosis present

## 2020-06-24 LAB — COMPREHENSIVE METABOLIC PANEL
ALT: 40 U/L (ref 0–44)
AST: 43 U/L — ABNORMAL HIGH (ref 15–41)
Albumin: 2.5 g/dL — ABNORMAL LOW (ref 3.5–5.0)
Alkaline Phosphatase: 131 U/L — ABNORMAL HIGH (ref 38–126)
Anion gap: 11 (ref 5–15)
BUN: 15 mg/dL (ref 8–23)
CO2: 21 mmol/L — ABNORMAL LOW (ref 22–32)
Calcium: 10 mg/dL (ref 8.9–10.3)
Chloride: 106 mmol/L (ref 98–111)
Creatinine, Ser: 0.55 mg/dL (ref 0.44–1.00)
GFR, Estimated: >60 mL/min (ref >60)
Glucose, Bld: 145 mg/dL — ABNORMAL HIGH (ref 70–99)
Potassium: 3.5 mmol/L (ref 3.5–5.1)
Sodium: 138 mmol/L (ref 135–145)
Total Bilirubin: 1.1 mg/dL (ref 0.3–1.2)
Total Protein: 6.7 g/dL (ref 6.5–8.1)

## 2020-06-24 LAB — HEMOGLOBIN AND HEMATOCRIT, BLOOD
HCT: 27.3 % — ABNORMAL LOW (ref 36.0–46.0)
Hemoglobin: 9.1 g/dL — ABNORMAL LOW (ref 12.0–15.0)

## 2020-06-24 LAB — GLUCOSE, CAPILLARY
Glucose-Capillary: 136 mg/dL — ABNORMAL HIGH (ref 70–99)
Glucose-Capillary: 147 mg/dL — ABNORMAL HIGH (ref 70–99)
Glucose-Capillary: 155 mg/dL — ABNORMAL HIGH (ref 70–99)
Glucose-Capillary: 160 mg/dL — ABNORMAL HIGH (ref 70–99)

## 2020-06-24 LAB — CK: Total CK: 236 U/L — ABNORMAL HIGH (ref 38–234)

## 2020-06-24 MED ORDER — CARVEDILOL 12.5 MG PO TABS
12.5000 mg | ORAL_TABLET | Freq: Two times a day (BID) | ORAL | Status: DC
  Administered 2020-06-24 – 2020-06-28 (×8): 12.5 mg via ORAL
  Filled 2020-06-24 (×8): qty 1

## 2020-06-24 MED ORDER — SODIUM CHLORIDE 0.9 % IV SOLN
1.0000 g | INTRAVENOUS | Status: DC
  Administered 2020-06-24 – 2020-06-25 (×2): 1 g via INTRAVENOUS
  Filled 2020-06-24 (×2): qty 10

## 2020-06-24 NOTE — Plan of Care (Signed)
  Problem: Safety: Goal: Ability to remain free from injury will improve Outcome: Progressing   

## 2020-06-24 NOTE — Evaluation (Signed)
Physical Therapy Evaluation Patient Details Name: Andrea Kirk MRN: [id number] DOB: 1940-01-08 Today's Date: 06/24/2020   History of Present Illness  Pt is a 80 y/o female presenting to ED on 06/23/2020  with confusion after a fall. CT head and neck negative for acute findings. UA showed pyuria. PMH includes: CVA, HTN, IIDM, diabetic neuropathy, chronic ambulation dysfunction with shuffling gait, vertigo on as needed Antivert, CAD status post CABG.  Clinical Impression  Pt demonstrates generalized weakness and deficits in activity tolerance, functional mobility, balance, gait, cognition, and safety. Pt tolerates bed mobility and transfers without physical assistance, limited by reports of dizziness on standing and returned to supine. Pt positive for orthostatics, increasing falls risk. Pt will benefit from continued acute PT to improve safety and independence in functional mobility, and to decrease risk of future falls. SPT recommends SNF placement as pt requires physical assist and supervision for all OOB mobility.      Follow Up Recommendations SNF;Supervision for mobility/OOB    Equipment Recommendations  Rolling walker with 5" wheels;3in1 (PT);Wheelchair (measurements PT);Wheelchair cushion (measurements PT)    Recommendations for Other Services       Precautions / Restrictions Precautions Precautions: Fall;Other (comment) (BP) Precaution Comments: watch BP, orthostatic Restrictions Weight Bearing Restrictions: No      Mobility  Bed Mobility Overal bed mobility: Needs Assistance Bed Mobility: Supine to Sit     Supine to sit: Min guard;HOB elevated     General bed mobility comments: min G for safety    Transfers Overall transfer level: Needs assistance Equipment used: Rolling walker (2 wheeled) Transfers: Sit to/from Stand Sit to Stand: Min guard         General transfer comment: min G for safety with reports of dizziness.  Ambulation/Gait              General Gait Details: deferred secondary to positive orthostatics and reports of dizziness on standing.  Stairs            Wheelchair Mobility    Modified Rankin (Stroke Patients Only)       Balance Overall balance assessment: Needs assistance Sitting-balance support: Feet supported Sitting balance-Leahy Scale: Fair     Standing balance support: During functional activity;Bilateral upper extremity supported Standing balance-Leahy Scale: Poor Standing balance comment: Pt reliant on UE support for standing                             Pertinent Vitals/Pain Pain Assessment: No/denies pain    Home Living Family/patient expects to be discharged to:: Other (Comment) (Pt coming from [facility]. Notes indicate that pt's brother feels that pt may need higher level of care at this time.)                 Additional Comments: Pt reports using o2 PRN PTA.    Prior Function Level of Independence: Independent with assistive device(s)         Comments: Pt reports independence with ADLs and use of 3-wheeled walker for mobility.     Hand Dominance        Extremity/Trunk Assessment   Upper Extremity Assessment Upper Extremity Assessment: Generalized weakness    Lower Extremity Assessment Lower Extremity Assessment: Generalized weakness    Cervical / Trunk Assessment Cervical / Trunk Assessment: Kyphotic  Communication   Communication: No difficulties  Cognition Arousal/Alertness: Awake/alert Behavior During Therapy: WFL for tasks assessed/performed Overall Cognitive Status: Impaired/Different from baseline Area of  Impairment: Following commands;Awareness;Problem solving                       Following Commands: Follows one step commands consistently;Follows one step commands with increased time   Awareness: Intellectual Problem Solving: Decreased initiation;Requires verbal cues;Requires tactile cues General Comments:  Pt believes pulse ox to be on backwards during session. Pt follows one step commands with increased time.      General Comments General comments (skin integrity, edema, etc.): Pt initally on 3 L [place], satting 99%. Pt weaned to RA and satting at 96%. Orthostatics taken in supine BP- 138/75, HR- 83; sitting BP- 148/73, HR- 86; standing with reports of dizziness and BP- 122/85, HR- 112. Pt returned to supine and BP taken again- 122/85.    Exercises     Assessment/Plan    PT Assessment Patient needs continued PT services  PT Problem List Decreased strength;Decreased activity tolerance;Decreased balance;Decreased mobility;Decreased cognition;Decreased safety awareness;Decreased knowledge of precautions;Cardiopulmonary status limiting activity       PT Treatment Interventions DME instruction;Gait training;Functional mobility training;Therapeutic activities;Therapeutic exercise;Balance training;Patient/family education    PT Goals (Current goals can be found in the Care Plan section)  Acute Rehab PT Goals Patient Stated Goal: get better PT Goal Formulation: With patient Time For Goal Achievement: 07/08/20 Potential to Achieve Goals: Good    Frequency Min 2X/week   Barriers to discharge        Co-evaluation               AM-PAC PT "6 Clicks" Mobility  Outcome Measure Help needed turning from your back to your side while in a flat bed without using bedrails?: None Help needed moving from lying on your back to sitting on the side of a flat bed without using bedrails?: A Little Help needed moving to and from a bed to a chair (including a wheelchair)?: A Little Help needed standing up from a chair using your arms (e.g., wheelchair or bedside chair)?: A Little Help needed to walk in hospital room?: A Little Help needed climbing 3-5 steps with a railing? : A Lot 6 Click Score: 18    End of Session Equipment Utilized During Treatment: Gait belt Activity Tolerance: Treatment limited  secondary to medical complications (Comment) (orthostatics +) Patient left: in bed;with call bell/phone within reach;with bed alarm set Nurse Communication: Mobility status;Other (comment) (orthostatics) PT Visit Diagnosis: Unsteadiness on feet (R26.81);History of falling (Z91.81);Repeated falls (R29.6);Other abnormalities of gait and mobility (R26.89);Muscle weakness (generalized) (M62.81);Difficulty in walking, not elsewhere classified (R26.2);Dizziness and giddiness (R42)    Time: 4081-4481 PT Time Calculation (min) (ACUTE ONLY): 26 min   Charges:   PT Evaluation $PT Eval Low Complexity: 1 Low          Acute Rehab  Pager: [phone]   Garwin Brothers, SPT  06/24/2020, 12:49 PM

## 2020-06-24 NOTE — Progress Notes (Signed)
PROGRESS NOTE    Fahmida Jurich Chokshi  MWN:[id number] DOB: [birth year] DOA: 06/23/2020 PCP: Aura Dials, MD     Brief Narrative:  Roland Rack Alonzo is a 80 y.o. female with medical history significant of CVA, HTN, IIDM, diabetic neuropathy, chronic ambulation dysfunction with shuffling gait, vertigo on as needed Antivert, CAD status post CABG, presented with fall and confusion.   Patient reported that she was at home and standing and started to feel vertigo and loss of balance and fell down.  Feeling weak and unable to support herself she remained on the floor through the night and was found by facility staff in the morning.  She complained about burning sensation with urination for last 2 days.    Patient's brother reported that patient has had 3 falls in the last 56-month, at baseline patient has unsteady gait and tends to fall on uneven ground.  It appears that the patient was trying to get out of bed at night but on the right side of the bed and fell and trapped between her bed and the wall, and unable to get out.  Per the report of the patient worsening of her ambulation dysfunction/shuffling gait lately, and brother recommend she keep up using the cane and instead use the walker.  New events last 24 hours / Subjective: Patient seen that she was only able to participate in for short period of time with PT.  She was able to get to standing at the bedside but that was about it.  Continues to have some dysuria.  Assessment & Plan:   Active Problems:   Acute UTI   Syncope, vasovagal   Syncope   Fall versus syncope -Unclear if she had an actual syncopal episode or a mechanical fall -CT head negative for acute intracranial abnormality, showed remote left cerebellar infarct -CT cervical spine negative for acute fracture or subluxation -Pelvic x-ray negative for fracture of the pelvis -CK improving 436 >> 236 -Orthostatic VS 138/75 HR 83 --> 122/85 HR 112.  Repeat orthostatic vital signs  tomorrow -Had a recent echocardiogram, did not show aortic stenosis -PT OT recommending SNF placement  UTI, present on admission -Urine culture pending -Empiric Rocephin  Hx CVA -Aspirin, brilinta  Chronic diastolic CHF -Does not appear fluid overloaded on exam  HTN -We will decrease Coreg dose, discontinue Norvasc  DM type 2 -Continue NovoLog sliding scale insulin  Depression -Continue Celexa  DVT prophylaxis:  enoxaparin (LOVENOX) injection 30 mg Start: 06/23/20 1900  Code Status:     Code Status Orders  (From admission, onward)           Start     Ordered   06/23/20 1737  Full code  Continuous        06/23/20 1738           Code Status History     Date Active Date Inactive Code Status Order ID Comments User Context   04/16/2020 1211 04/20/2020 2138 Full Code [id number]  Phillips Grout, MD ED   12/22/2017 0035 12/22/2017 2036 DNR [id number]  Lenore Cordia, MD ED   11/07/2016 0625 11/08/2016 1637 Full Code [id number]  Jani Gravel, MD ED   08/04/2016 0811 08/06/2016 1545 Full Code [id number]  Elwin Mocha, MD ED   03/22/2016 1428 03/23/2016 1541 Full Code [id number]  Alvia Grove, PA-C Inpatient   04/16/2013 1257 04/17/2013 1438 Full Code [id number]  Early, Arvilla Meres, MD Inpatient      Family Communication: Spoke with brother over  the phone Disposition Plan:  Status is: Observation  The patient will require care spanning > 2 midnights and should be moved to inpatient because: Hemodynamically unstable, Unsafe d/c plan, IV treatments appropriate due to intensity of illness or inability to take PO, and Inpatient level of care appropriate due to severity of illness  Dispo: The patient is from:  Independent living              Anticipated d/c is to: SNF              Patient currently is not medically stable to d/c.   Difficult to place patient No     Antimicrobials:  Anti-infectives (From admission, onward)    Start     Dose/Rate Route Frequency Ordered  Stop   06/24/20 1000  cefTRIAXone (ROCEPHIN) 1 g in sodium chloride 0.9 % 100 mL IVPB        1 g 200 mL/hr over 30 Minutes Intravenous Every 24 hours 06/24/20 0133     06/23/20 1645  cefTRIAXone (ROCEPHIN) 1 g in sodium chloride 0.9 % 100 mL IVPB        1 g 200 mL/hr over 30 Minutes Intravenous  Once 06/23/20 1639 06/23/20 1853        Objective: Vitals:   06/23/20 2206 06/24/20 0438 06/24/20 0814 06/24/20 1231  BP: (!) 175/76 (!) 147/83 137/75 116/61  Pulse: 78 83 83 76  Resp: 20 14    Temp: 98.5 F (36.9 C) 99.7 F (37.6 C) 98.1 F (36.7 C)   TempSrc: Oral Oral    SpO2: 100% 99% 99% 99%  Weight: 69 kg 68.7 kg    Height: 5\' 5"  (1.651 m)       Intake/Output Summary (Last 24 hours) at 06/24/2020 1308 Last data filed at 06/24/2020 1255 Gross per 24 hour  Intake 1280 ml  Output 1275 ml  Net 5 ml   Filed Weights   06/23/20 1414 06/23/20 2206 06/24/20 0438  Weight: 81.6 kg 69 kg 68.7 kg    Examination:  General exam: Appears calm and comfortable  Respiratory system: Clear to auscultation. Respiratory effort normal. No respiratory distress. No conversational dyspnea.  Cardiovascular system: S1 & S2 heard, RRR. No murmurs. No pedal edema. Gastrointestinal system: Abdomen is nondistended, soft and nontender. Normal bowel sounds heard. Central nervous system: Alert and oriented. No focal neurological deficits. Speech slow but clear.  Extremities: Symmetric in appearance  Skin: No rashes, lesions or ulcers on exposed skin  Psychiatry: Judgement and insight appear normal. Mood & affect appropriate.   Data Reviewed: I have personally reviewed following labs and imaging studies  CBC: Recent Labs  Lab 06/23/20 1500 06/23/20 1916 06/24/20 0758  WBC 6.9  --   --   NEUTROABS 5.2  --   --   HGB 10.0* 21.8* 9.1*  HCT 30.9* 64.0* 27.3*  MCV 111.2*  --   --   PLT 215  --   --    Basic Metabolic Panel: Recent Labs  Lab 06/23/20 1500 06/23/20 1916 06/24/20 0339  NA 139  143 138  K 4.3 4.5 3.5  CL 111  --  106  CO2 17*  --  21*  GLUCOSE 207*  --  145*  BUN 19  --  15  CREATININE 0.65  --  0.55  CALCIUM 10.4*  --  10.0   GFR: Estimated Creatinine Clearance: 55.5 mL/min (by C-G formula based on SCr of 0.55 mg/dL). Liver Function Tests: Recent Labs  Lab  06/23/20 1500 06/24/20 0339  AST 71* 43*  ALT 54* 40  ALKPHOS 164* 131*  BILITOT 1.6* 1.1  PROT 7.2 6.7  ALBUMIN 3.0* 2.5*   No results for input(s): LIPASE, AMYLASE in the last 168 hours. No results for input(s): AMMONIA in the last 168 hours. Coagulation Profile: No results for input(s): INR, PROTIME in the last 168 hours. Cardiac Enzymes: Recent Labs  Lab 06/23/20 1500 06/24/20 0339  CKTOTAL 436* 236*   BNP (last 3 results) No results for input(s): PROBNP in the last 8760 hours. HbA1C: No results for input(s): HGBA1C in the last 72 hours. CBG: Recent Labs  Lab 06/23/20 1452 06/24/20 0604 06/24/20 1134  GLUCAP 192* 136* 147*   Lipid Profile: No results for input(s): CHOL, HDL, LDLCALC, TRIG, CHOLHDL, LDLDIRECT in the last 72 hours. Thyroid Function Tests: No results for input(s): TSH, T4TOTAL, FREET4, T3FREE, THYROIDAB in the last 72 hours. Anemia Panel: No results for input(s): VITAMINB12, FOLATE, FERRITIN, TIBC, IRON, RETICCTPCT in the last 72 hours. Sepsis Labs: Recent Labs  Lab 06/23/20 1429  LATICACIDVEN 1.0    Recent Results (from the past 240 hour(s))  Resp Panel by RT-PCR (Flu A&B, Covid) Nasopharyngeal Swab     Status: None   Collection Time: 06/23/20  3:52 PM   Specimen: Nasopharyngeal Swab; Nasopharyngeal(NP) swabs in vial transport medium  Result Value Ref Range Status   SARS Coronavirus 2 by RT PCR NEGATIVE NEGATIVE Final    Comment: (NOTE) SARS-CoV-2 target nucleic acids are NOT DETECTED.  The SARS-CoV-2 RNA is generally detectable in upper respiratory specimens during the acute phase of infection. The lowest concentration of SARS-CoV-2 viral copies  this assay can detect is 138 copies/mL. A negative result does not preclude SARS-Cov-2 infection and should not be used as the sole basis for treatment or other patient management decisions. A negative result may occur with  improper specimen collection/handling, submission of specimen other than nasopharyngeal swab, presence of viral mutation(s) within the areas targeted by this assay, and inadequate number of viral copies(<138 copies/mL). A negative result must be combined with clinical observations, patient history, and epidemiological information. The expected result is Negative.  Fact Sheet for Patients:  EntrepreneurPulse.com.au  Fact Sheet for Healthcare Providers:  IncredibleEmployment.be  This test is no t yet approved or cleared by the [place] FDA and  has been authorized for detection and/or diagnosis of SARS-CoV-2 by FDA under an Emergency Use Authorization (EUA). This EUA will remain  in effect (meaning this test can be used) for the duration of the COVID-19 declaration under Section 564(b)(1) of the Act, 21 U.S.C.section 360bbb-3(b)(1), unless the authorization is terminated  or revoked sooner.       Influenza A by PCR NEGATIVE NEGATIVE Final   Influenza B by PCR NEGATIVE NEGATIVE Final    Comment: (NOTE) The Xpert Xpress SARS-CoV-2/FLU/RSV plus assay is intended as an aid in the diagnosis of influenza from Nasopharyngeal swab specimens and should not be used as a sole basis for treatment. Nasal washings and aspirates are unacceptable for Xpert Xpress SARS-CoV-2/FLU/RSV testing.  Fact Sheet for Patients: EntrepreneurPulse.com.au  Fact Sheet for Healthcare Providers: IncredibleEmployment.be  This test is not yet approved or cleared by the [place] FDA and has been authorized for detection and/or diagnosis of SARS-CoV-2 by FDA under an Emergency Use Authorization (EUA). This EUA  will remain in effect (meaning this test can be used) for the duration of the COVID-19 declaration under Section 564(b)(1) of the Act, 21 U.S.C. section 360bbb-3(b)(1),  unless the authorization is terminated or revoked.  Performed at [facility], [place] [address] [place], [place] [address]       Radiology Studies: DG Chest 2 View  Result Date: 06/23/2020 CLINICAL DATA:  Trip and fall at home. EXAM: CHEST - 2 VIEW COMPARISON:  04/16/2020 FINDINGS: Post median sternotomy and CABG. Upper normal heart size with stable mediastinal contours. Aortic atherosclerosis. No pneumothorax, acute airspace disease, pleural effusion or pulmonary edema. Mild right greater than left basilar atelectasis. Broad-based scoliotic curvature of the thoracic spine without acute osseous abnormalities. The bones are under mineralized. IMPRESSION: 1. No acute chest findings. 2. Mild right greater than left basilar atelectasis. Electronically Signed   By: Keith Rake M.D.   On: 06/23/2020 16:11   DG Pelvis 1-2 Views  Result Date: 06/23/2020 CLINICAL DATA:  Trip and fall at home. EXAM: PELVIS - 1-2 VIEW COMPARISON:  None. FINDINGS: The cortical margins of the bony pelvis are intact. No fracture. Pubic symphysis and sacroiliac joints are congruent. Both femoral heads are well-seated in the respective acetabula. Age related degenerative spurring of the acetabula. Surgical hardware in the lower lumbar spine is partially included. IMPRESSION: No fracture of the pelvis. Electronically Signed   By: Keith Rake M.D.   On: 06/23/2020 16:12   CT Head Wo Contrast  Result Date: 06/23/2020 CLINICAL DATA:  Head trauma, minor (Age >= 65y) Tripped at home leading to fall. EXAM: CT HEAD WITHOUT CONTRAST TECHNIQUE: Contiguous axial images were obtained from the base of the skull through the vertex without intravenous contrast. COMPARISON:  Head CT and brain MRI 04/16/2020. FINDINGS: Brain: No definite correlate for the small  acute infarct in the left temporal lobe on prior MRI. No intracranial hemorrhage, mass effect, or midline shift. No hydrocephalus. The basilar cisterns are patent. Remote left cerebellar infarct again seen. No evidence of territorial infarct or acute ischemia. No extra-axial or intracranial fluid collection. Vascular: Atherosclerosis of skullbase vasculature without hyperdense vessel or abnormal calcification. Skull: No fracture or focal lesion. Sinuses/Orbits: Mucosal thickening of ethmoid air cells fluid level in left side of sphenoid sinus is new from prior exam, there is no increased fluid density to suggest hemosinus. No visualized fracture. Mastoid air cells are clear. Acute orbital findings Other: None. IMPRESSION: 1. No acute intracranial abnormality. No skull fracture. 2. Remote left cerebellar infarct. No definite CT correlate for the recent small left temporal infarct on MRI. Electronically Signed   By: Keith Rake M.D.   On: 06/23/2020 16:06   CT Cervical Spine Wo Contrast  Result Date: 06/23/2020 CLINICAL DATA:  Neck trauma (Age >= 65y) Trip and fall at home. EXAM: CT CERVICAL SPINE WITHOUT CONTRAST TECHNIQUE: Multidetector CT imaging of the cervical spine was performed without intravenous contrast. Multiplanar CT image reconstructions were also generated. COMPARISON:  04/16/2020 FINDINGS: Alignment: No traumatic subluxation. Again seen straightening of normal lordosis and minimal anterolisthesis of C7 on T1. Skull base and vertebrae: No acute fracture. Vertebral body heights are maintained. The dens and skull base are intact. Stable C1-C2 degenerative change with pannus formation. Soft tissues and spinal canal: No prevertebral fluid or swelling. No visible canal hematoma. Scattered surgical clips in the neck soft tissues. 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 There is mild multilevel facet hypertrophy. Upper chest: No acute or  unexpected findings Other: None. IMPRESSION: 1. No acute fracture or subluxation of the cervical spine. 2. Stable degenerative change from prior exam. Again seen loss  of normal cervical lordosis. Electronically Signed   By: Keith Rake M.D.   On: 06/23/2020 16:10      Scheduled Meds:  amLODipine  5 mg Oral Daily   vitamin C  1,000 mg Oral Daily   aspirin EC  81 mg Oral QPM   carvedilol  25 mg Oral BID   citalopram  20 mg Oral q morning   enoxaparin (LOVENOX) injection  30 mg Subcutaneous Q24H   ferrous sulfate  325 mg Oral Daily   gabapentin  300 mg Oral QID   glipiZIDE  5 mg Oral Q breakfast   insulin aspart  0-15 Units Subcutaneous TID WC   magnesium oxide  400 mg Oral Daily   sodium chloride flush  3 mL Intravenous Q12H   ticagrelor  90 mg Oral BID   vitamin B-12  2,500 mcg Oral Daily   Continuous Infusions:  cefTRIAXone (ROCEPHIN)  IV 1 g (06/24/20 0844)     LOS: 0 days      Time spent: 35 minutes   Dessa Phi, DO [facility] 06/24/2020, 1:08 PM   Available via Epic secure chat 7am-7pm After these hours, please refer to coverage provider listed on [email or web address]

## 2020-06-25 LAB — URINE CULTURE: Culture: >=100000 — AB

## 2020-06-25 LAB — GLUCOSE, CAPILLARY
Glucose-Capillary: 126 mg/dL — ABNORMAL HIGH (ref 70–99)
Glucose-Capillary: 200 mg/dL — ABNORMAL HIGH (ref 70–99)
Glucose-Capillary: 188 mg/dL — ABNORMAL HIGH (ref 70–99)
Glucose-Capillary: 249 mg/dL — ABNORMAL HIGH (ref 70–99)

## 2020-06-25 MED ORDER — CEPHALEXIN 250 MG PO CAPS
500.0000 mg | ORAL_CAPSULE | Freq: Four times a day (QID) | ORAL | Status: DC
  Administered 2020-06-25 – 2020-06-28 (×13): 500 mg via ORAL
  Filled 2020-06-25 (×13): qty 2

## 2020-06-25 NOTE — Progress Notes (Signed)
PROGRESS NOTE    Andrea Kirk  NIO:[id number] DOB: [birth year] DOA: 06/23/2020 PCP: Aura Dials, MD     Brief Narrative:  Andrea Kirk is a 80 y.o. female with medical history significant of CVA, HTN, IIDM, diabetic neuropathy, chronic ambulation dysfunction with shuffling gait, vertigo on as needed Antivert, CAD status post CABG, presented with fall and confusion.   Patient reported that she was at home and standing and started to feel vertigo and loss of balance and fell down.  Feeling weak and unable to support herself she remained on the floor through the night and was found by facility staff in the morning.  She complained about burning sensation with urination for last 2 days.    Patient's brother reported that patient has had 3 falls in the last 55-month, at baseline patient has unsteady gait and tends to fall on uneven ground.  It appears that the patient was trying to get out of bed at night but on the right side of the bed and fell and trapped between her bed and the wall, and unable to get out.  Per the report of the patient worsening of her ambulation dysfunction/shuffling gait lately, and brother recommend she keep up using the cane and instead use the walker.  New events last 24 hours / Subjective: No new physical complaints, continues to be very weak.  Assessment & Plan:   Active Problems:   Acute UTI   Syncope, vasovagal   Syncope   Fall versus syncope -Unclear if she had an actual syncopal episode or a mechanical fall -CT head negative for acute intracranial abnormality, showed remote left cerebellar infarct -CT cervical spine negative for acute fracture or subluxation -Pelvic x-ray negative for fracture of the pelvis -CK improved 436 >> 236 -Orthostatic VS 138/75 HR 83 --> 122/85 HR 112 -Repeat orthostatic VS improved 131/59 HR 74 --> 109/65 HR 89 -Had a recent echocardiogram, did not show aortic stenosis -PT OT recommending SNF placement  E. coli UTI, present  on admission -Pansensitive -Rocephin --> de-escalate to Keflex  Hx CVA -Aspirin, brilinta  Chronic diastolic CHF -Does not appear fluid overloaded on exam  HTN -We will decrease Coreg dose, discontinue Norvasc in setting of orthostatic hypotension  DM type 2 -Continue NovoLog sliding scale insulin  Depression -Continue Celexa  DVT prophylaxis:  enoxaparin (LOVENOX) injection 30 mg Start: 06/23/20 1900  Code Status:     Code Status Orders  (From admission, onward)           Start     Ordered   06/23/20 1737  Full code  Continuous        06/23/20 1738           Code Status History     Date Active Date Inactive Code Status Order ID Comments User Context   04/16/2020 1211 04/20/2020 2138 Full Code [id number]  Phillips Grout, MD ED   12/22/2017 0035 12/22/2017 2036 DNR [id number]  Lenore Cordia, MD ED   11/07/2016 0625 11/08/2016 1637 Full Code [id number]  Jani Gravel, MD ED   08/04/2016 0811 08/06/2016 1545 Full Code [id number]  Elwin Mocha, MD ED   03/22/2016 1428 03/23/2016 1541 Full Code [id number]  Alvia Grove, PA-C Inpatient   04/16/2013 1257 04/17/2013 1438 Full Code [id number]  Early, Arvilla Meres, MD Inpatient      Family Communication: No family at bedside Disposition Plan:  Status is: Inpatient  Remains inpatient appropriate because:Unsafe d/c plan  Dispo: The patient  is from: Home              Anticipated d/c is to: SNF              Patient currently is not medically stable to d/c.   Difficult to place patient No          Antimicrobials:  Anti-infectives (From admission, onward)    Start     Dose/Rate Route Frequency Ordered Stop   06/24/20 1000  cefTRIAXone (ROCEPHIN) 1 g in sodium chloride 0.9 % 100 mL IVPB        1 g 200 mL/hr over 30 Minutes Intravenous Every 24 hours 06/24/20 0133     06/23/20 1645  cefTRIAXone (ROCEPHIN) 1 g in sodium chloride 0.9 % 100 mL IVPB        1 g 200 mL/hr over 30 Minutes Intravenous  Once 06/23/20 1639  06/23/20 1853        Objective: Vitals:   06/24/20 2349 06/25/20 0000 06/25/20 0401 06/25/20 1104  BP: (!) 131/59  98/63 (!) 116/58  Pulse: 74  70 82  Resp: 18  18 18   Temp: 97.8 F (36.6 C)  97.7 F (36.5 C) 98.7 F (37.1 C)  TempSrc: Oral  Oral Oral  SpO2: 100%  100% 100%  Weight:  69.8 kg    Height:        Intake/Output Summary (Last 24 hours) at 06/25/2020 1239 Last data filed at 06/25/2020 1106 Gross per 24 hour  Intake 1020 ml  Output 1100 ml  Net -80 ml    Filed Weights   06/23/20 2206 06/24/20 0438 06/25/20 0000  Weight: 69 kg 68.7 kg 69.8 kg   Examination: General exam: Appears calm and comfortable  Respiratory system: Clear to auscultation. Respiratory effort normal. Cardiovascular system: S1 & S2 heard, RRR. No pedal edema. Gastrointestinal system: Abdomen is nondistended, soft and nontender. Normal bowel sounds heard. Central nervous system: Alert and oriented. Non focal exam. Speech clear  Extremities: Symmetric in appearance bilaterally  Skin: No rashes, lesions or ulcers on exposed skin  Psychiatry: Judgement and insight appear stable. Mood & affect appropriate.    Data Reviewed: I have personally reviewed following labs and imaging studies  CBC: Recent Labs  Lab 06/23/20 1500 06/23/20 1916 06/24/20 0758  WBC 6.9  --   --   NEUTROABS 5.2  --   --   HGB 10.0* 21.8* 9.1*  HCT 30.9* 64.0* 27.3*  MCV 111.2*  --   --   PLT 215  --   --     Basic Metabolic Panel: Recent Labs  Lab 06/23/20 1500 06/23/20 1916 06/24/20 0339  NA 139 143 138  K 4.3 4.5 3.5  CL 111  --  106  CO2 17*  --  21*  GLUCOSE 207*  --  145*  BUN 19  --  15  CREATININE 0.65  --  0.55  CALCIUM 10.4*  --  10.0    GFR: Estimated Creatinine Clearance: 55.9 mL/min (by C-G formula based on SCr of 0.55 mg/dL). Liver Function Tests: Recent Labs  Lab 06/23/20 1500 06/24/20 0339  AST 71* 43*  ALT 54* 40  ALKPHOS 164* 131*  BILITOT 1.6* 1.1  PROT 7.2 6.7  ALBUMIN  3.0* 2.5*    No results for input(s): LIPASE, AMYLASE in the last 168 hours. No results for input(s): AMMONIA in the last 168 hours. Coagulation Profile: No results for input(s): INR, PROTIME in the last 168 hours. Cardiac Enzymes: Recent  Labs  Lab 06/23/20 1500 06/24/20 0339  CKTOTAL 436* 236*    BNP (last 3 results) No results for input(s): PROBNP in the last 8760 hours. HbA1C: No results for input(s): HGBA1C in the last 72 hours. CBG: Recent Labs  Lab 06/24/20 1134 06/24/20 1611 06/24/20 2030 06/25/20 0525 06/25/20 1057  GLUCAP 147* 155* 160* 126* 200*    Lipid Profile: No results for input(s): CHOL, HDL, LDLCALC, TRIG, CHOLHDL, LDLDIRECT in the last 72 hours. Thyroid Function Tests: No results for input(s): TSH, T4TOTAL, FREET4, T3FREE, THYROIDAB in the last 72 hours. Anemia Panel: No results for input(s): VITAMINB12, FOLATE, FERRITIN, TIBC, IRON, RETICCTPCT in the last 72 hours. Sepsis Labs: Recent Labs  Lab 06/23/20 1429  LATICACIDVEN 1.0     Recent Results (from the past 240 hour(s))  Urine culture     Status: Abnormal   Collection Time: 06/23/20  2:53 PM   Specimen: Urine, Random  Result Value Ref Range Status   Specimen Description URINE, RANDOM  Final   Special Requests   Final    NONE Performed at [facility], [place] [address] [place], [place] [address]    Culture >=100,000 COLONIES/mL ESCHERICHIA COLI (A)  Final   Report Status 06/25/2020 FINAL  Final   Organism ID, Bacteria ESCHERICHIA COLI (A)  Final      Susceptibility   Escherichia coli - MIC*    AMPICILLIN 8 SENSITIVE Sensitive     CEFAZOLIN <=4 SENSITIVE Sensitive     CEFEPIME <=0.12 SENSITIVE Sensitive     CEFTRIAXONE <=0.25 SENSITIVE Sensitive     CIPROFLOXACIN <=0.25 SENSITIVE Sensitive     GENTAMICIN <=1 SENSITIVE Sensitive     IMIPENEM <=0.25 SENSITIVE Sensitive     NITROFURANTOIN <=16 SENSITIVE Sensitive     TRIMETH/SULFA <=20 SENSITIVE Sensitive      AMPICILLIN/SULBACTAM <=2 SENSITIVE Sensitive     PIP/TAZO <=4 SENSITIVE Sensitive     * >=100,000 COLONIES/mL ESCHERICHIA COLI  Resp Panel by RT-PCR (Flu A&B, Covid) Nasopharyngeal Swab     Status: None   Collection Time: 06/23/20  3:52 PM   Specimen: Nasopharyngeal Swab; Nasopharyngeal(NP) swabs in vial transport medium  Result Value Ref Range Status   SARS Coronavirus 2 by RT PCR NEGATIVE NEGATIVE Final    Comment: (NOTE) SARS-CoV-2 target nucleic acids are NOT DETECTED.  The SARS-CoV-2 RNA is generally detectable in upper respiratory specimens during the acute phase of infection. The lowest concentration of SARS-CoV-2 viral copies this assay can detect is 138 copies/mL. A negative result does not preclude SARS-Cov-2 infection and should not be used as the sole basis for treatment or other patient management decisions. A negative result may occur with  improper specimen collection/handling, submission of specimen other than nasopharyngeal swab, presence of viral mutation(s) within the areas targeted by this assay, and inadequate number of viral copies(<138 copies/mL). A negative result must be combined with clinical observations, patient history, and epidemiological information. The expected result is Negative.  Fact Sheet for Patients:  EntrepreneurPulse.com.au  Fact Sheet for Healthcare Providers:  IncredibleEmployment.be  This test is no t yet approved or cleared by the [place] FDA and  has been authorized for detection and/or diagnosis of SARS-CoV-2 by FDA under an Emergency Use Authorization (EUA). This EUA will remain  in effect (meaning this test can be used) for the duration of the COVID-19 declaration under Section 564(b)(1) of the Act, 21 U.S.C.section 360bbb-3(b)(1), unless the authorization is terminated  or revoked sooner.  Influenza A by PCR NEGATIVE NEGATIVE Final   Influenza B by PCR NEGATIVE NEGATIVE Final     Comment: (NOTE) The Xpert Xpress SARS-CoV-2/FLU/RSV plus assay is intended as an aid in the diagnosis of influenza from Nasopharyngeal swab specimens and should not be used as a sole basis for treatment. Nasal washings and aspirates are unacceptable for Xpert Xpress SARS-CoV-2/FLU/RSV testing.  Fact Sheet for Patients: EntrepreneurPulse.com.au  Fact Sheet for Healthcare Providers: IncredibleEmployment.be  This test is not yet approved or cleared by the [place] FDA and has been authorized for detection and/or diagnosis of SARS-CoV-2 by FDA under an Emergency Use Authorization (EUA). This EUA will remain in effect (meaning this test can be used) for the duration of the COVID-19 declaration under Section 564(b)(1) of the Act, 21 U.S.C. section 360bbb-3(b)(1), unless the authorization is terminated or revoked.  Performed at [facility], [place] [address], [place], [place] [address]        Radiology Studies: DG Chest 2 View  Result Date: 06/23/2020 CLINICAL DATA:  Trip and fall at home. EXAM: CHEST - 2 VIEW COMPARISON:  04/16/2020 FINDINGS: Post median sternotomy and CABG. Upper normal heart size with stable mediastinal contours. Aortic atherosclerosis. No pneumothorax, acute airspace disease, pleural effusion or pulmonary edema. Mild right greater than left basilar atelectasis. Broad-based scoliotic curvature of the thoracic spine without acute osseous abnormalities. The bones are under mineralized. IMPRESSION: 1. No acute chest findings. 2. Mild right greater than left basilar atelectasis. Electronically Signed   By: Keith Rake M.D.   On: 06/23/2020 16:11   DG Pelvis 1-2 Views  Result Date: 06/23/2020 CLINICAL DATA:  Trip and fall at home. EXAM: PELVIS - 1-2 VIEW COMPARISON:  None. FINDINGS: The cortical margins of the bony pelvis are intact. No fracture. Pubic symphysis and sacroiliac joints are congruent. Both femoral heads are  well-seated in the respective acetabula. Age related degenerative spurring of the acetabula. Surgical hardware in the lower lumbar spine is partially included. IMPRESSION: No fracture of the pelvis. Electronically Signed   By: Keith Rake M.D.   On: 06/23/2020 16:12   CT Head Wo Contrast  Result Date: 06/23/2020 CLINICAL DATA:  Head trauma, minor (Age >= 65y) Tripped at home leading to fall. EXAM: CT HEAD WITHOUT CONTRAST TECHNIQUE: Contiguous axial images were obtained from the base of the skull through the vertex without intravenous contrast. COMPARISON:  Head CT and brain MRI 04/16/2020. FINDINGS: Brain: No definite correlate for the small acute infarct in the left temporal lobe on prior MRI. No intracranial hemorrhage, mass effect, or midline shift. No hydrocephalus. The basilar cisterns are patent. Remote left cerebellar infarct again seen. No evidence of territorial infarct or acute ischemia. No extra-axial or intracranial fluid collection. Vascular: Atherosclerosis of skullbase vasculature without hyperdense vessel or abnormal calcification. Skull: No fracture or focal lesion. Sinuses/Orbits: Mucosal thickening of ethmoid air cells fluid level in left side of sphenoid sinus is new from prior exam, there is no increased fluid density to suggest hemosinus. No visualized fracture. Mastoid air cells are clear. Acute orbital findings Other: None. IMPRESSION: 1. No acute intracranial abnormality. No skull fracture. 2. Remote left cerebellar infarct. No definite CT correlate for the recent small left temporal infarct on MRI. Electronically Signed   By: Keith Rake M.D.   On: 06/23/2020 16:06   CT Cervical Spine Wo Contrast  Result Date: 06/23/2020 CLINICAL DATA:  Neck trauma (Age >= 65y) Trip and fall at home. EXAM: CT CERVICAL SPINE WITHOUT CONTRAST  TECHNIQUE: Multidetector CT imaging of the cervical spine was performed without intravenous contrast. Multiplanar CT image reconstructions were also  generated. COMPARISON:  04/16/2020 FINDINGS: Alignment: No traumatic subluxation. Again seen straightening of normal lordosis and minimal anterolisthesis of C7 on T1. Skull base and vertebrae: No acute fracture. Vertebral body heights are maintained. The dens and skull base are intact. Stable C1-C2 degenerative change with pannus formation. Soft tissues and spinal canal: No prevertebral fluid or swelling. No visible canal hematoma. Scattered surgical clips in the neck soft tissues. 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 There is mild multilevel facet hypertrophy. Upper chest: No acute or unexpected findings Other: None. IMPRESSION: 1. No acute fracture or subluxation of the cervical spine. 2. Stable degenerative change from prior exam. Again seen loss of normal cervical lordosis. Electronically Signed   By: Keith Rake M.D.   On: 06/23/2020 16:10      Scheduled Meds:  vitamin C  1,000 mg Oral Daily   aspirin EC  81 mg Oral QPM   carvedilol  12.5 mg Oral BID   citalopram  20 mg Oral q morning   enoxaparin (LOVENOX) injection  30 mg Subcutaneous Q24H   ferrous sulfate  325 mg Oral Daily   gabapentin  300 mg Oral QID   insulin aspart  0-15 Units Subcutaneous TID WC   magnesium oxide  400 mg Oral Daily   sodium chloride flush  3 mL Intravenous Q12H   ticagrelor  90 mg Oral BID   vitamin B-12  2,500 mcg Oral Daily   Continuous Infusions:  cefTRIAXone (ROCEPHIN)  IV 1 g (06/25/20 1027)     LOS: 1 day      Time spent: 20  minutes   Dessa Phi, DO [facility] 06/25/2020, 12:39 PM   Available via Epic secure chat 7am-7pm After these hours, please refer to coverage provider listed on [email or web address]

## 2020-06-25 NOTE — [place] FL2 (Signed)
[place] MEDICAID FL2 LEVEL OF CARE SCREENING TOOL     IDENTIFICATION  Patient Name: Andrea Kirk Birthdate: December 28, 1940 Sex: female Admission Date (Current Location): 06/23/2020  [facility] and [place] Number:  Herbalist and Address:  [facility], [place] [address], [place], [place] [address]      Provider Number: [id number]  Attending Physician Name and Address:  Dessa Phi, DO  Relative Name and Phone Number:       Current Level of Care: Hospital Recommended Level of Care: [place] Prior Approval Number:    Date Approved/Denied:   PASRR Number: [id number]  Discharge Plan: SNF    Current Diagnoses: Patient Active Problem List   Diagnosis Date Noted   Syncope, vasovagal 06/23/2020   Syncope 06/23/2020   Encephalopathy    Pulmonary hypertension ([place]) 04/20/2020   Controlled type 2 diabetes mellitus with hyperglycemia ([place]) 04/20/2020   Essential hypertension 04/20/2020   HLD (hyperlipidemia) 04/20/2020   Acute renal failure superimposed on stage 2 chronic kidney disease ([place]) 04/20/2020   Iron deficiency anemia 04/20/2020   Acute respiratory failure with hypoxia ([place]) 04/19/2020   Obese 04/19/2020   Chronic back pain 04/19/2020   Acute stroke due to ischemia [facility])    CVA (cerebral vascular accident) ([place]) 04/16/2020   Hyperkalemia 04/16/2020   Follow-up examination after eye surgery 04/22/2019   Macular retinoschisis, 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   Vitreomacular traction syndrome, 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   Depression 12/22/2017   Chronic pain 12/22/2017   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   Falls 12/22/2017   Chronic diastolic CHF (congestive heart failure) ([place]) 12/22/2017   Hypoxia 12/21/2017   ARF (acute renal failure) ([place]) 11/07/2016   Acute UTI 11/07/2016   Acute kidney injury ([place]) 08/05/2016   AKI (acute kidney injury) ([place]) 08/04/2016   Frequent falls 07/16/2016   Hypertension 07/16/2016    Asymptomatic stenosis of right carotid artery 03/22/2016   Diabetes mellitus ([place]) 05/02/2015   Carotid stenosis 11/16/2013   Aftercare following surgery of the circulatory system, NEC 05/11/2013   Carotid artery disease ([place]) 04/16/2013   Coronary artery disease 03/11/2013   Essential hypertension, benign 03/11/2013   Hyperlipidemia 03/11/2013   Occlusion and stenosis of carotid artery without mention of cerebral infarction 07/19/2011    Orientation RESPIRATION BLADDER Height & Weight     Self  O2 ([place] 2L) Incontinent Weight: 153 lb 12.8 oz (69.8 kg) Height:  5\' 5"  (165.1 cm)  BEHAVIORAL SYMPTOMS/MOOD NEUROLOGICAL BOWEL NUTRITION STATUS      Continent Diet  AMBULATORY STATUS COMMUNICATION OF NEEDS Skin   Limited Assist Verbally Normal                       Personal Care Assistance Level of Assistance  Bathing, Feeding, Dressing Bathing Assistance: Limited assistance Feeding assistance: Limited assistance Dressing Assistance: Limited assistance     Functional Limitations Info             [place]  PT (By licensed PT), OT (By licensed OT)     PT Frequency: 5x/wk OT Frequency: 5x/wk            Contractures Contractures Info: Not present    Additional Factors Info  Code Status, Allergies, Psychotropic, Insulin Sliding Scale Code Status Info: Full Allergies Info: Codeine, Latex, Morphine And Related, Cyclobenzaprine, Methocarbamol, Morphine Psychotropic Info: Celexa 20 mg daily in the morning Insulin Sliding Scale Info: see DC summary  Current Medications (06/25/2020):  This is the current hospital active medication list Current Facility-Administered Medications  Medication Dose Route Frequency Provider Last Rate Last Admin   acetaminophen (TYLENOL) tablet 650 mg  650 mg Oral Q6H PRN Wynetta Fines T, MD   650 mg at 06/24/20 2231   ascorbic acid (VITAMIN C) tablet 1,000 mg  1,000 mg Oral Daily Wynetta Fines T, MD   1,000 mg at 06/25/20  1031   aspirin EC tablet 81 mg  81 mg Oral QPM Wynetta Fines T, MD   81 mg at 06/24/20 1608   carvedilol (COREG) tablet 12.5 mg  12.5 mg Oral BID Dessa Phi, DO   12.5 mg at 06/25/20 1030   cefTRIAXone (ROCEPHIN) 1 g in sodium chloride 0.9 % 100 mL IVPB  1 g Intravenous Q24H Wynetta Fines T, MD 200 mL/hr at 06/25/20 1027 1 g at 06/25/20 1027   citalopram (CELEXA) tablet 20 mg  20 mg Oral q morning Wynetta Fines T, MD   20 mg at 06/25/20 1038   enoxaparin (LOVENOX) injection 30 mg  30 mg Subcutaneous Q24H Wynetta Fines T, MD   30 mg at 06/24/20 2129   ferrous sulfate tablet 325 mg  325 mg Oral Daily Wynetta Fines T, MD   325 mg at 06/25/20 1028   gabapentin (NEURONTIN) capsule 300 mg  300 mg Oral QID Wynetta Fines T, MD   300 mg at 06/25/20 0635   HYDROcodone-acetaminophen (NORCO) 10-325 MG per tablet 1 tablet  1 tablet Oral BID PRN Wynetta Fines T, MD   1 tablet at 06/25/20 1030   insulin aspart (novoLOG) injection 0-15 Units  0-15 Units Subcutaneous TID WC Lequita Halt, MD   2 Units at 06/25/20 0858   magnesium oxide (MAG-OX) tablet 400 mg  400 mg Oral Daily Wynetta Fines T, MD   400 mg at 06/25/20 1030   meclizine (ANTIVERT) tablet 25 mg  25 mg Oral BID PRN Wynetta Fines T, MD       pantoprazole (PROTONIX) EC tablet 40 mg  40 mg Oral Daily PRN Wynetta Fines T, MD       promethazine (PHENERGAN) tablet 12.5 mg  12.5 mg Oral Q6H PRN Wynetta Fines T, MD   12.5 mg at 06/25/20 1028   sodium chloride flush (NS) 0.9 % injection 3 mL  3 mL Intravenous Q12H Wynetta Fines T, MD   3 mL at 06/25/20 1040   ticagrelor (BRILINTA) tablet 90 mg  90 mg Oral BID Wynetta Fines T, MD   90 mg at 06/25/20 1031   vitamin B-12 (CYANOCOBALAMIN) tablet 2,500 mcg  2,500 mcg Oral Daily Wynetta Fines T, MD   2,500 mcg at 06/25/20 1029     Discharge Medications: Please see discharge summary for a list of discharge medications.  Relevant Imaging Results:  Relevant Lab Results:   Additional Information SS#: [id number]  Geralynn Ochs,  LCSW

## 2020-06-25 NOTE — TOC Initial Note (Signed)
Transition of Care [facility]) - Initial/Assessment Note    Patient Details  Name: Andrea Kirk MRN: [id number] Date of Birth: Sep 22, 1940  Transition of Care [facility]) CM/SW Contact:    Geralynn Ochs, LCSW Phone Number: 06/25/2020, 11:37 AM  Clinical Narrative:      CSW spoke with patient's brother, Herbie Baltimore, over the phone about SNF recommendation. Brother in agreement to SNF, and said patient had been somewhere recently and done well but he could not remember the name. (Per chart review, patient was recently at [facility] Lengthy discussion was had with the patient's brother about how the patient likely cannot return to independent living on her own and will need either caregiver support or ALF, but he does not know the patient's finances and is concerned about the price of ALF. CSW discussed with the brother about looking into Medicaid and whether the patient would qualify, and CSW can provide referral to [facility] for assistance in navigating, as well. CSW faxed out referral for SNF and will follow with bed offers.             Expected Discharge Plan: Skilled Nursing Facility Barriers to Discharge: Continued Medical Work up, Ship broker   Patient Goals and CMS Choice Patient states their goals for this hospitalization and ongoing recovery are:: patient unable to participate in goal setting, only oriented to self CMS Medicare.gov Compare Post Acute Care list provided to:: Patient Represenative (must comment) Choice offered to / list presented to : Sibling  Expected Discharge Plan and Services Expected Discharge Plan: [place] Acute Care Choice: [place] Living arrangements for the past 2 months: [place] Expected Discharge Date: 04/20/20                                    Prior Living Arrangements/Services Living arrangements for the past 2 months: [place] Lives with::  Self Patient language and need for interpreter reviewed:: No Do you feel safe going back to the place where you live?: Yes      Need for Family Participation in Patient Care: Yes (Comment) Care giver support system in place?: No (comment) Current home services: DME Criminal Activity/Legal Involvement Pertinent to Current Situation/Hospitalization: No - Comment as needed  Activities of Daily Living Home Assistive Devices/Equipment: Cane (specify quad or straight), Walker (specify type) ADL Screening (condition at time of admission) Patient's cognitive ability adequate to safely complete daily activities?: Yes Is the patient deaf or have difficulty hearing?: Yes Does the patient have difficulty seeing, even when wearing glasses/contacts?: Yes Does the patient have difficulty concentrating, remembering, or making decisions?: Yes Patient able to express need for assistance with ADLs?: Yes Does the patient have difficulty dressing or bathing?: Yes Independently performs ADLs?: No Communication: Independent Dressing (OT): Needs assistance Is this a change from baseline?: Change from baseline, expected to last >3 days Grooming: Needs assistance Is this a change from baseline?: Change from baseline, expected to last >3 days Feeding: Needs assistance Is this a change from baseline?: Change from baseline, expected to last >3 days Bathing: Needs assistance Is this a change from baseline?: Change from baseline, expected to last >3 days Toileting: Needs assistance Is this a change from baseline?: Change from baseline, expected to last >3days In/Out Bed: Needs assistance Is this a change from baseline?: Change from baseline, expected to last >3 days Walks in  Home: Needs assistance Is this a change from baseline?: Change from baseline, expected to last >3 days Does the patient have difficulty walking or climbing stairs?: Yes Weakness of Legs: Both Weakness of Arms/Hands: None  Permission  Sought/Granted Permission sought to share information with : Facility Sport and exercise psychologist, Family Supports Permission granted to share information with : Yes, Verbal Permission Granted  Share Information with NAME: Herbie Baltimore  Permission granted to share info w AGENCY: SNF  Permission granted to share info w Relationship: Brother     Emotional Assessment Appearance:: Appears stated age Attitude/Demeanor/Rapport: Unable to Assess Affect (typically observed): Unable to Assess Orientation: : Oriented to Self Alcohol / Substance Use: Not Applicable Psych Involvement: No (comment)  Admission diagnosis:  Fall [W19.XXXA] CVA (cerebral vascular accident) ([place]) [I63.9] Acute stroke due to ischemia [facility]) [I63.9] Patient Active Problem List   Diagnosis Date Noted   Syncope, vasovagal 06/23/2020   Syncope 06/23/2020   Encephalopathy    Pulmonary hypertension ([place]) 04/20/2020   Controlled type 2 diabetes mellitus with hyperglycemia ([place]) 04/20/2020   Essential hypertension 04/20/2020   HLD (hyperlipidemia) 04/20/2020   Acute renal failure superimposed on stage 2 chronic kidney disease ([place]) 04/20/2020   Iron deficiency anemia 04/20/2020   Acute respiratory failure with hypoxia ([place]) 04/19/2020   Obese 04/19/2020   Chronic back pain 04/19/2020   Acute stroke due to ischemia [facility])    CVA (cerebral vascular accident) ([place]) 04/16/2020   Hyperkalemia 04/16/2020   Follow-up examination after eye surgery 04/22/2019   Macular retinoschisis, 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   Vitreomacular traction syndrome, 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   Depression 12/22/2017   Chronic pain 12/22/2017   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   Falls 12/22/2017   Chronic diastolic CHF (congestive heart failure) ([place]) 12/22/2017   Hypoxia 12/21/2017   ARF (acute renal failure) ([place]) 11/07/2016   Acute UTI 11/07/2016   Acute kidney injury ([place]) 08/05/2016   AKI (acute kidney injury) ([place]) 08/04/2016   Frequent falls 07/16/2016    Hypertension 07/16/2016   Asymptomatic stenosis of right carotid artery 03/22/2016   Diabetes mellitus ([place]) 05/02/2015   Carotid stenosis 11/16/2013   Aftercare following surgery of the circulatory system, NEC 05/11/2013   Carotid artery disease ([place]) 04/16/2013   Coronary artery disease 03/11/2013   Essential hypertension, benign 03/11/2013   Hyperlipidemia 03/11/2013   Occlusion and stenosis of carotid artery without mention of cerebral infarction 07/19/2011   PCP:  Aura Dials, MD Pharmacy:   [facility]  ([place], [place] [address] [place], [address] [place], [place] [address] Phone: [phone] Fax: [phone]  [facility] [place] [id number] - [place], [place] [place] [address] Phone: [phone] Fax: [phone]     Social Determinants of Health (SDOH) Interventions    Readmission Risk Interventions No flowsheet data found.

## 2020-06-26 LAB — GLUCOSE, CAPILLARY
Glucose-Capillary: 164 mg/dL — ABNORMAL HIGH (ref 70–99)
Glucose-Capillary: 272 mg/dL — ABNORMAL HIGH (ref 70–99)
Glucose-Capillary: 215 mg/dL — ABNORMAL HIGH (ref 70–99)
Glucose-Capillary: 180 mg/dL — ABNORMAL HIGH (ref 70–99)

## 2020-06-26 LAB — SARS CORONAVIRUS 2 (TAT 6-24 HRS): SARS Coronavirus 2: NEGATIVE

## 2020-06-26 LAB — HEMOGLOBIN A1C
Hgb A1c MFr Bld: 6.1 % — ABNORMAL HIGH (ref 4.8–5.6)
Mean Plasma Glucose: 128 mg/dL

## 2020-06-26 NOTE — Progress Notes (Signed)
PROGRESS NOTE    Andrea Kirk  OFB:[id number] DOB: [birth year] DOA: 06/23/2020 PCP: Aura Dials, MD     Brief Narrative:  Andrea Kirk is a 80 y.o. female with medical history significant of CVA, HTN, IIDM, diabetic neuropathy, chronic ambulation dysfunction with shuffling gait, vertigo on as needed Antivert, stable vestibular schwannoma, CAD status post CABG, presented with fall and confusion.   Patient reported that she was at home and standing and started to feel vertigo and loss of balance and fell down.  Feeling weak and unable to support herself she remained on the floor through the night and was found by facility staff in the morning.  She complained about burning sensation with urination for last 2 days.    Patient's brother reported that patient has had 3 falls in the last 47-month, at baseline patient has unsteady gait and tends to fall on uneven ground.  It appears that the patient was trying to get out of bed at night but on the right side of the bed and fell and trapped between her bed and the wall, and unable to get out.  Per the report of the patient worsening of her ambulation dysfunction/shuffling gait lately, and brother recommend she keep up using the cane and instead use the walker.  New events last 24 hours / Subjective: Patient without any new complaints today, still with some weakness but was able to ambulate around the room more using a walker.  Her main complaint is pain at the IV site on right.  Assessment & Plan:   Active Problems:   Acute UTI   Syncope, vasovagal   Syncope   Fall versus syncope -Unclear if she had an actual syncopal episode or a mechanical fall -CT head negative for acute intracranial abnormality, showed remote left cerebellar infarct -CT cervical spine negative for acute fracture or subluxation -Pelvic x-ray negative for fracture of the pelvis -CK improved 436 >> 236 -Orthostatic VS 138/75 HR 83 --> 122/85 HR 112 -Repeat orthostatic VS  improved 131/59 HR 74 --> 109/65 HR 89 -Had a recent echocardiogram, did not show aortic stenosis -PT OT recommending SNF placement  E. coli UTI, present on admission -Pansensitive -Rocephin --> de-escalate to Keflex  Hx CVA -Aspirin, brilinta  Chronic diastolic CHF -Does not appear fluid overloaded on exam  HTN -We will decrease Coreg dose, discontinue Norvasc in setting of orthostatic hypotension  DM type 2 -Continue NovoLog sliding scale insulin  Depression -Continue Celexa  Stable vestibular schwannoma -Followed by Dr. Constance Holster, has had serial MRI which had remained stable in size  DVT prophylaxis:  enoxaparin (LOVENOX) injection 30 mg Start: 06/23/20 1900  Code Status:     Code Status Orders  (From admission, onward)           Start     Ordered   06/23/20 1737  Full code  Continuous        06/23/20 1738           Code Status History     Date Active Date Inactive Code Status Order ID Comments User Context   04/16/2020 1211 04/20/2020 2138 Full Code [id number]  Phillips Grout, MD ED   12/22/2017 0035 12/22/2017 2036 DNR [id number]  Lenore Cordia, MD ED   11/07/2016 0625 11/08/2016 1637 Full Code [id number]  Jani Gravel, MD ED   08/04/2016 0811 08/06/2016 1545 Full Code [id number]  Elwin Mocha, MD ED   03/22/2016 1428 03/23/2016 1541 Full Code [id number]  Virgina Jock  Tomma Rakers Inpatient   04/16/2013 1257 04/17/2013 1438 Full Code [id number]  EarlyArvilla Meres, MD Inpatient      Family Communication: No family at bedside, updated brother over the phone today >15-minute Disposition Plan:  Status is: Inpatient  Remains inpatient appropriate because:Unsafe d/c plan  Dispo: The patient is from: Home              Anticipated d/c is to: SNF              Patient currently is medically stable to d/c.  Awaiting SNF placement   Difficult to place patient No          Antimicrobials:  Anti-infectives (From admission, onward)    Start     Dose/Rate Route  Frequency Ordered Stop   06/25/20 1330  cephALEXin (KEFLEX) capsule 500 mg        500 mg Oral Every 6 hours 06/25/20 1241     06/24/20 1000  cefTRIAXone (ROCEPHIN) 1 g in sodium chloride 0.9 % 100 mL IVPB  Status:  Discontinued        1 g 200 mL/hr over 30 Minutes Intravenous Every 24 hours 06/24/20 0133 06/25/20 1241   06/23/20 1645  cefTRIAXone (ROCEPHIN) 1 g in sodium chloride 0.9 % 100 mL IVPB        1 g 200 mL/hr over 30 Minutes Intravenous  Once 06/23/20 1639 06/23/20 1853        Objective: Vitals:   06/25/20 1956 06/26/20 0345 06/26/20 0555 06/26/20 1039  BP: 126/68  (!) 121/54 126/78  Pulse: 93  69 76  Resp: 16  18 17   Temp: 99.7 F (37.6 C)  (!) 97.5 F (36.4 C) 99 F (37.2 C)  TempSrc: Oral  Oral Oral  SpO2: 93%  100% 100%  Weight:  70.3 kg    Height:        Intake/Output Summary (Last 24 hours) at 06/26/2020 1211 Last data filed at 06/26/2020 0838 Gross per 24 hour  Intake 690 ml  Output 775 ml  Net -85 ml    Filed Weights   06/24/20 0438 06/25/20 0000 06/26/20 0345  Weight: 68.7 kg 69.8 kg 70.3 kg  Examination: General exam: Appears calm and comfortable  Respiratory system: Clear to auscultation. Respiratory effort normal. Cardiovascular system: S1 & S2 heard, RRR. No pedal edema. Gastrointestinal system: Abdomen is nondistended, soft and nontender. Normal bowel sounds heard. Central nervous system: Alert and oriented. Non focal exam. Speech clear  Extremities: Symmetric in appearance bilaterally  Skin: No rashes, lesions or ulcers on exposed skin  Psychiatry: Judgement and insight appear stable. Mood & affect appropriate.    Data Reviewed: I have personally reviewed following labs and imaging studies  CBC: Recent Labs  Lab 06/23/20 1500 06/23/20 1916 06/24/20 0758  WBC 6.9  --   --   NEUTROABS 5.2  --   --   HGB 10.0* 21.8* 9.1*  HCT 30.9* 64.0* 27.3*  MCV 111.2*  --   --   PLT 215  --   --     Basic Metabolic Panel: Recent Labs  Lab  06/23/20 1500 06/23/20 1916 06/24/20 0339  NA 139 143 138  K 4.3 4.5 3.5  CL 111  --  106  CO2 17*  --  21*  GLUCOSE 207*  --  145*  BUN 19  --  15  CREATININE 0.65  --  0.55  CALCIUM 10.4*  --  10.0    GFR: Estimated Creatinine Clearance:  56.1 mL/min (by C-G formula based on SCr of 0.55 mg/dL). Liver Function Tests: Recent Labs  Lab 06/23/20 1500 06/24/20 0339  AST 71* 43*  ALT 54* 40  ALKPHOS 164* 131*  BILITOT 1.6* 1.1  PROT 7.2 6.7  ALBUMIN 3.0* 2.5*    No results for input(s): LIPASE, AMYLASE in the last 168 hours. No results for input(s): AMMONIA in the last 168 hours. Coagulation Profile: No results for input(s): INR, PROTIME in the last 168 hours. Cardiac Enzymes: Recent Labs  Lab 06/23/20 1500 06/24/20 0339  CKTOTAL 436* 236*    BNP (last 3 results) No results for input(s): PROBNP in the last 8760 hours. HbA1C: Recent Labs    06/23/20 1740  HGBA1C 6.1*   CBG: Recent Labs  Lab 06/25/20 1057 06/25/20 1532 06/25/20 2120 06/26/20 0552 06/26/20 1040  GLUCAP 200* 188* 249* 164* 272*    Lipid Profile: No results for input(s): CHOL, HDL, LDLCALC, TRIG, CHOLHDL, LDLDIRECT in the last 72 hours. Thyroid Function Tests: No results for input(s): TSH, T4TOTAL, FREET4, T3FREE, THYROIDAB in the last 72 hours. Anemia Panel: No results for input(s): VITAMINB12, FOLATE, FERRITIN, TIBC, IRON, RETICCTPCT in the last 72 hours. Sepsis Labs: Recent Labs  Lab 06/23/20 1429  LATICACIDVEN 1.0     Recent Results (from the past 240 hour(s))  Urine culture     Status: Abnormal   Collection Time: 06/23/20  2:53 PM   Specimen: Urine, Random  Result Value Ref Range Status   Specimen Description URINE, RANDOM  Final   Special Requests   Final    NONE Performed at [facility], [place] [address] [place], [place] [address]    Culture >=100,000 COLONIES/mL ESCHERICHIA COLI (A)  Final   Report Status 06/25/2020 FINAL  Final   Organism ID, Bacteria  ESCHERICHIA COLI (A)  Final      Susceptibility   Escherichia coli - MIC*    AMPICILLIN 8 SENSITIVE Sensitive     CEFAZOLIN <=4 SENSITIVE Sensitive     CEFEPIME <=0.12 SENSITIVE Sensitive     CEFTRIAXONE <=0.25 SENSITIVE Sensitive     CIPROFLOXACIN <=0.25 SENSITIVE Sensitive     GENTAMICIN <=1 SENSITIVE Sensitive     IMIPENEM <=0.25 SENSITIVE Sensitive     NITROFURANTOIN <=16 SENSITIVE Sensitive     TRIMETH/SULFA <=20 SENSITIVE Sensitive     AMPICILLIN/SULBACTAM <=2 SENSITIVE Sensitive     PIP/TAZO <=4 SENSITIVE Sensitive     * >=100,000 COLONIES/mL ESCHERICHIA COLI  Resp Panel by RT-PCR (Flu A&B, Covid) Nasopharyngeal Swab     Status: None   Collection Time: 06/23/20  3:52 PM   Specimen: Nasopharyngeal Swab; Nasopharyngeal(NP) swabs in vial transport medium  Result Value Ref Range Status   SARS Coronavirus 2 by RT PCR NEGATIVE NEGATIVE Final    Comment: (NOTE) SARS-CoV-2 target nucleic acids are NOT DETECTED.  The SARS-CoV-2 RNA is generally detectable in upper respiratory specimens during the acute phase of infection. The lowest concentration of SARS-CoV-2 viral copies this assay can detect is 138 copies/mL. A negative result does not preclude SARS-Cov-2 infection and should not be used as the sole basis for treatment or other patient management decisions. A negative result may occur with  improper specimen collection/handling, submission of specimen other than nasopharyngeal swab, presence of viral mutation(s) within the areas targeted by this assay, and inadequate number of viral copies(<138 copies/mL). A negative result must be combined with clinical observations, patient history, and epidemiological information. The expected result is Negative.  Fact Sheet for  Patients:  EntrepreneurPulse.com.au  Fact Sheet for Healthcare Providers:  IncredibleEmployment.be  This test is no t yet approved or cleared by the [place] FDA and  has  been authorized for detection and/or diagnosis of SARS-CoV-2 by FDA under an Emergency Use Authorization (EUA). This EUA will remain  in effect (meaning this test can be used) for the duration of the COVID-19 declaration under Section 564(b)(1) of the Act, 21 U.S.C.section 360bbb-3(b)(1), unless the authorization is terminated  or revoked sooner.       Influenza A by PCR NEGATIVE NEGATIVE Final   Influenza B by PCR NEGATIVE NEGATIVE Final    Comment: (NOTE) The Xpert Xpress SARS-CoV-2/FLU/RSV plus assay is intended as an aid in the diagnosis of influenza from Nasopharyngeal swab specimens and should not be used as a sole basis for treatment. Nasal washings and aspirates are unacceptable for Xpert Xpress SARS-CoV-2/FLU/RSV testing.  Fact Sheet for Patients: EntrepreneurPulse.com.au  Fact Sheet for Healthcare Providers: IncredibleEmployment.be  This test is not yet approved or cleared by the [place] FDA and has been authorized for detection and/or diagnosis of SARS-CoV-2 by FDA under an Emergency Use Authorization (EUA). This EUA will remain in effect (meaning this test can be used) for the duration of the COVID-19 declaration under Section 564(b)(1) of the Act, 21 U.S.C. section 360bbb-3(b)(1), unless the authorization is terminated or revoked.  Performed at [facility], [place] [address], [place], [place] [address]        Radiology Studies: No results found.    Scheduled Meds:  vitamin C  1,000 mg Oral Daily   aspirin EC  81 mg Oral QPM   carvedilol  12.5 mg Oral BID   cephALEXin  500 mg Oral Q6H   citalopram  20 mg Oral q morning   enoxaparin (LOVENOX) injection  30 mg Subcutaneous Q24H   ferrous sulfate  325 mg Oral Daily   gabapentin  300 mg Oral QID   insulin aspart  0-15 Units Subcutaneous TID WC   magnesium oxide  400 mg Oral Daily   sodium chloride flush  3 mL Intravenous Q12H   ticagrelor  90 mg Oral BID    vitamin B-12  2,500 mcg Oral Daily   Continuous Infusions:     LOS: 2 days      Time spent: 30 minutes   Dessa Phi, DO [facility] 06/26/2020, 12:11 PM   Available via Epic secure chat 7am-7pm After these hours, please refer to coverage provider listed on [email or web address]

## 2020-06-26 NOTE — Plan of Care (Signed)
  Problem: Education: Goal: Knowledge of General Education information will improve Description: Including pain rating scale, medication(s)/side effects and non-pharmacologic comfort measures Outcome: Progressing   Problem: Health Behavior/Discharge Planning: Goal: Ability to manage health-related needs will improve Outcome: Progressing   Problem: Activity: Goal: Risk for activity intolerance will decrease Outcome: Progressing   

## 2020-06-27 LAB — GLUCOSE, CAPILLARY
Glucose-Capillary: 185 mg/dL — ABNORMAL HIGH (ref 70–99)
Glucose-Capillary: 268 mg/dL — ABNORMAL HIGH (ref 70–99)
Glucose-Capillary: 243 mg/dL — ABNORMAL HIGH (ref 70–99)
Glucose-Capillary: 215 mg/dL — ABNORMAL HIGH (ref 70–99)

## 2020-06-27 LAB — POCT I-STAT EG7
Acid-base deficit: 5 mmol/L — ABNORMAL HIGH (ref 0.0–2.0)
Bicarbonate: 20.7 mmol/L (ref 20.0–28.0)
Calcium, Ion: 1.36 mmol/L (ref 1.15–1.40)
HCT: 64 % — ABNORMAL HIGH (ref 36.0–46.0)
Hemoglobin: 21.8 g/dL (ref 12.0–15.0)
O2 Saturation: 81 %
Potassium: 4.5 mmol/L (ref 3.5–5.1)
Sodium: 143 mmol/L (ref 135–145)
TCO2: 22 mmol/L (ref 22–32)
pCO2, Ven: 38.8 mmHg — ABNORMAL LOW (ref 44.0–60.0)
pH, Ven: 7.336 (ref 7.250–7.430)
pO2, Ven: 48 mmHg — ABNORMAL HIGH (ref 32.0–45.0)

## 2020-06-27 MED ORDER — INSULIN ASPART 100 UNIT/ML IJ SOLN
0.0000 [IU] | Freq: Three times a day (TID) | INTRAMUSCULAR | Status: DC
  Administered 2020-06-27: 8 [IU] via SUBCUTANEOUS
  Administered 2020-06-27: 3 [IU] via SUBCUTANEOUS
  Administered 2020-06-27: 5 [IU] via SUBCUTANEOUS
  Administered 2020-06-28: 3 [IU] via SUBCUTANEOUS
  Administered 2020-06-28: 8 [IU] via SUBCUTANEOUS

## 2020-06-27 MED ORDER — INSULIN ASPART 100 UNIT/ML IJ SOLN: 0.0000 [IU] | Freq: Three times a day (TID) | INTRAMUSCULAR | Status: DC

## 2020-06-27 MED ORDER — ENOXAPARIN SODIUM 40 MG/0.4ML IJ SOSY
40.0000 mg | PREFILLED_SYRINGE | INTRAMUSCULAR | Status: DC
  Administered 2020-06-27: 40 mg via SUBCUTANEOUS
  Filled 2020-06-27: qty 0.4

## 2020-06-27 NOTE — Progress Notes (Signed)
Physical Therapy Treatment Patient Details Name: Andrea Kirk MRN: [id number] DOB: 07-12-1940 Today's Date: 06/27/2020    History of Present Illness Pt is a 80 y.o. female admitted from [place] on 06/23/20 with confusion after a fall. Head/neck CT negative for acute injury. Workup revealed orthostatic hypotension, E. coli UTI. PMH includes CVA, HTN, DM2, neuropathy, CAD (s/p CABG), vertigo, chronic ambulation dysfunction.   PT Comments    Pt progressing with mobility, pleasant and motivated to participate. Today's session focused on gait training with RW and LE therex. t remains limited by generalized weakness, decreased activity tolerance and impaired balance strategies/postural reactions, as well as decreased awareness; at high risk for falls. Continue to recommend SNF-level therapies to maximize functional mobility and independence prior to return to ILF.   Post-ambulation BP 139/67 (pt denies dizziness this session) HR 76 SpO2 90-94% on RA   Follow Up Recommendations  SNF;Supervision for mobility/OOB     Equipment Recommendations  Rolling walker with 5" wheels;3in1 (PT);Wheelchair (measurements PT);Wheelchair cushion (measurements PT)    Recommendations for Other Services       Precautions / Restrictions Precautions Precautions: Fall Restrictions Weight Bearing Restrictions: No    Mobility  Bed Mobility Overal bed mobility: Needs Assistance Bed Mobility: Supine to Sit;Sit to Supine     Supine to sit: Supervision;HOB elevated Sit to supine: Supervision;HOB elevated        Transfers Overall transfer level: Needs assistance Equipment used: Rolling walker (2 wheeled) Transfers: Sit to/from Stand Sit to Stand: Min guard            Ambulation/Gait Ambulation/Gait assistance: Min guard Gait Distance (Feet): 60 Feet (+50) Assistive device: Rolling walker (2 wheeled) Gait Pattern/deviations: Step-through pattern;Decreased stride length;Trunk flexed Gait velocity:  Decreased   General Gait Details: Slow gait with RW and intermittent min guard for balance; 1x standing rest break secondary to fatigue   Stairs             Wheelchair Mobility    Modified Rankin (Stroke Patients Only)       Balance Overall balance assessment: Needs assistance Sitting-balance support: Feet supported;No upper extremity supported Sitting balance-Leahy Scale: Good Sitting balance - Comments: Able to don bilateral socks sitting EOB   Standing balance support: During functional activity;Bilateral upper extremity supported;No upper extremity supported Standing balance-Leahy Scale: Fair Standing balance comment: Can static stand without UE support; static and dynamic stability improved with RW                            Cognition Arousal/Alertness: Awake/alert Behavior During Therapy: WFL for tasks assessed/performed Overall Cognitive Status: No family/caregiver present to determine baseline cognitive functioning Area of Impairment: Attention;Following commands;Safety/judgement;Awareness;Problem solving                   Current Attention Level: Selective   Following Commands: Follows one step commands consistently;Follows one step commands with increased time Safety/Judgement: Decreased awareness of deficits;Decreased awareness of safety Awareness: Emergent Problem Solving: Decreased initiation;Requires verbal cues;Requires tactile cues        Exercises General Exercises - Lower Extremity Ankle Circles/Pumps: AROM;Both Long Arc Quad: AROM;Both;Seated Heel Slides: AROM;Both;Supine Straight Leg Raises: AROM;Both;Supine Hip Flexion/Marching: AROM;Both;Seated    General Comments General comments (skin integrity, edema, etc.): SpO2 90-94% on RA with activity, replaced 2L O2 at end of session. Pt denies dizziness throughout session; post-mobility BP 139/67, HR 76      Pertinent Vitals/Pain Pain Assessment: Faces Faces Pain Scale:  Hurts a little bit Pain Location: BLEs Pain Descriptors / Indicators: Sore Pain Intervention(s): Monitored during session (reports improved with ambulation/ROM)    Home Living                      Prior Function            PT Goals (current goals can now be found in the care plan section) Progress towards PT goals: Progressing toward goals    Frequency    Min 2X/week      PT Plan Current plan remains appropriate    Co-evaluation              AM-PAC PT "6 Clicks" Mobility   Outcome Measure  Help needed turning from your back to your side while in a flat bed without using bedrails?: None Help needed moving from lying on your back to sitting on the side of a flat bed without using bedrails?: A Little Help needed moving to and from a bed to a chair (including a wheelchair)?: A Little Help needed standing up from a chair using your arms (e.g., wheelchair or bedside chair)?: A Little Help needed to walk in hospital room?: A Little Help needed climbing 3-5 steps with a railing? : A Lot 6 Click Score: 18    End of Session Equipment Utilized During Treatment: Gait belt Activity Tolerance: Patient tolerated treatment well Patient left: in bed;with call bell/phone within reach;with bed alarm set Nurse Communication: Mobility status PT Visit Diagnosis: History of falling (Z91.81);Repeated falls (R29.6);Other abnormalities of gait and mobility (R26.89);Muscle weakness (generalized) (M62.81)     Time: 1497-0263 PT Time Calculation (min) (ACUTE ONLY): 24 min  Charges:  $Gait Training: 8-22 mins $Therapeutic Exercise: 8-22 mins                     Mabeline Caras, PT, DPT Acute Rehabilitation Services  Pager [phone] Office [place] 06/27/2020, 2:53 PM

## 2020-06-27 NOTE — Progress Notes (Signed)
PROGRESS NOTE    Emonni Depasquale Vangorder  ZDG:[id number] DOB: 03/24/1940 DOA: 06/23/2020 PCP: Aura Dials, MD     Brief Narrative:  Roland Rack Derk is a 80 y.o. female with medical history significant of CVA, HTN, IIDM, diabetic neuropathy, chronic ambulation dysfunction with shuffling gait, vertigo on as needed Antivert, stable vestibular schwannoma, CAD status post CABG, presented with fall and confusion.   Patient reported that she was at home and standing and started to feel vertigo and loss of balance and fell down.  Feeling weak and unable to support herself she remained on the floor through the night and was found by facility staff in the morning.  She complained about burning sensation with urination for last 2 days.    Patient's brother reported that patient has had 3 falls in the last 83-month, at baseline patient has unsteady gait and tends to fall on uneven ground.  It appears that the patient was trying to get out of bed at night but on the right side of the bed and fell and trapped between her bed and the wall, and unable to get out.  Per the report of the patient worsening of her ambulation dysfunction/shuffling gait lately, and brother recommend she keep up using the cane and instead use the walker.  New events last 24 hours / Subjective: Patient without any new complaints today.  Has been able to get out of bed a few times yesterday.  Awaiting SNF placement  Assessment & Plan:   Active Problems:   Acute UTI   Syncope, vasovagal   Syncope   Fall versus syncope -Unclear if she had an actual syncopal episode or a mechanical fall -CT head negative for acute intracranial abnormality, showed remote left cerebellar infarct -CT cervical spine negative for acute fracture or subluxation -Pelvic x-ray negative for fracture of the pelvis -CK improved 436 >> 236 -Orthostatic VS 138/75 HR 83 --> 122/85 HR 112 -Repeat orthostatic VS improved 131/59 HR 74 --> 109/65 HR 89 -Had a recent  echocardiogram, did not show aortic stenosis -PT OT recommending SNF placement  E. coli UTI, present on admission -Pansensitive -Rocephin --> de-escalate to Keflex  Hx CVA -Aspirin, brilinta  Chronic diastolic CHF -Does not appear fluid overloaded on exam  HTN -We will decrease Coreg dose, discontinue Norvasc in setting of orthostatic hypotension  DM type 2 -Continue NovoLog sliding scale insulin  Depression -Continue Celexa  Stable vestibular schwannoma -Followed by Dr. Constance Holster, has had serial MRI which had remained stable in size  DVT prophylaxis:  enoxaparin (LOVENOX) injection 40 mg Start: 06/27/20 2200  Code Status:     Code Status Orders  (From admission, onward)           Start     Ordered   06/23/20 1737  Full code  Continuous        06/23/20 1738           Code Status History     Date Active Date Inactive Code Status Order ID Comments User Context   04/16/2020 1211 04/20/2020 2138 Full Code [id number]  Phillips Grout, MD ED   12/22/2017 0035 12/22/2017 2036 DNR [id number]  Lenore Cordia, MD ED   11/07/2016 0625 11/08/2016 1637 Full Code [id number]  Jani Gravel, MD ED   08/04/2016 0811 08/06/2016 1545 Full Code [id number]  Elwin Mocha, MD ED   03/22/2016 1428 03/23/2016 1541 Full Code [id number]  Alvia Grove, PA-C Inpatient   04/16/2013 1257 04/17/2013 1438 Full Code  [id number]  Rosetta Posner, MD Inpatient      Family Communication: No family at bedside Disposition Plan:  Status is: Inpatient  Remains inpatient appropriate because:Unsafe d/c plan  Dispo: The patient is from: Home              Anticipated d/c is to: SNF              Patient currently is medically stable to d/c.  Awaiting SNF placement   Difficult to place patient No          Antimicrobials:  Anti-infectives (From admission, onward)    Start     Dose/Rate Route Frequency Ordered Stop   06/25/20 1330  cephALEXin (KEFLEX) capsule 500 mg        500 mg Oral Every 6 hours  06/25/20 1241 06/30/20 2359   06/24/20 1000  cefTRIAXone (ROCEPHIN) 1 g in sodium chloride 0.9 % 100 mL IVPB  Status:  Discontinued        1 g 200 mL/hr over 30 Minutes Intravenous Every 24 hours 06/24/20 0133 06/25/20 1241   06/23/20 1645  cefTRIAXone (ROCEPHIN) 1 g in sodium chloride 0.9 % 100 mL IVPB        1 g 200 mL/hr over 30 Minutes Intravenous  Once 06/23/20 1639 06/23/20 1853        Objective: Vitals:   06/26/20 1932 06/27/20 0517 06/27/20 0800 06/27/20 1201  BP: 120/65 (!) 172/65 118/65 (!) 136/54  Pulse: 67 66 68 65  Resp: 16 18 18 18   Temp: 97.6 F (36.4 C) 98.2 F (36.8 C) 98.3 F (36.8 C) 98.3 F (36.8 C)  TempSrc: Oral Oral Oral Oral  SpO2: 100% 100% 100% 100%  Weight:  70.1 kg    Height:        Intake/Output Summary (Last 24 hours) at 06/27/2020 1509 Last data filed at 06/27/2020 0900 Gross per 24 hour  Intake 900 ml  Output 725 ml  Net 175 ml    Filed Weights   06/25/20 0000 06/26/20 0345 06/27/20 0517  Weight: 69.8 kg 70.3 kg 70.1 kg  Examination: General exam: Appears calm and comfortable  Respiratory system: Clear to auscultation. Respiratory effort normal. Cardiovascular system: S1 & S2 heard, RRR. No pedal edema. Gastrointestinal system: Abdomen is nondistended, soft and nontender. Normal bowel sounds heard. Central nervous system: Alert and oriented. Non focal exam. Speech clear  Extremities: Symmetric in appearance bilaterally  Skin: No rashes, lesions or ulcers on exposed skin  Psychiatry: Judgement and insight appear stable. Mood & affect appropriate.     Data Reviewed: I have personally reviewed following labs and imaging studies  CBC: Recent Labs  Lab 06/23/20 1500 06/23/20 1916 06/24/20 0758  WBC 6.9  --   --   NEUTROABS 5.2  --   --   HGB 10.0* 21.8*  21.8* 9.1*  HCT 30.9* 64.0*  64.0* 27.3*  MCV 111.2*  --   --   PLT 215  --   --     Basic Metabolic Panel: Recent Labs  Lab 06/23/20 1500 06/23/20 1916 06/24/20 0339   NA 139 143  143 138  K 4.3 4.5  4.5 3.5  CL 111  --  106  CO2 17*  --  21*  GLUCOSE 207*  --  145*  BUN 19  --  15  CREATININE 0.65  --  0.55  CALCIUM 10.4*  --  10.0    GFR: Estimated Creatinine Clearance: 56 mL/min (by C-G formula based  on SCr of 0.55 mg/dL). Liver Function Tests: Recent Labs  Lab 06/23/20 1500 06/24/20 0339  AST 71* 43*  ALT 54* 40  ALKPHOS 164* 131*  BILITOT 1.6* 1.1  PROT 7.2 6.7  ALBUMIN 3.0* 2.5*    No results for input(s): LIPASE, AMYLASE in the last 168 hours. No results for input(s): AMMONIA in the last 168 hours. Coagulation Profile: No results for input(s): INR, PROTIME in the last 168 hours. Cardiac Enzymes: Recent Labs  Lab 06/23/20 1500 06/24/20 0339  CKTOTAL 436* 236*    BNP (last 3 results) No results for input(s): PROBNP in the last 8760 hours. HbA1C: No results for input(s): HGBA1C in the last 72 hours.  CBG: Recent Labs  Lab 06/26/20 1040 06/26/20 1606 06/26/20 2104 06/27/20 0601 06/27/20 1114  GLUCAP 272* 215* 180* 185* 268*    Lipid Profile: No results for input(s): CHOL, HDL, LDLCALC, TRIG, CHOLHDL, LDLDIRECT in the last 72 hours. Thyroid Function Tests: No results for input(s): TSH, T4TOTAL, FREET4, T3FREE, THYROIDAB in the last 72 hours. Anemia Panel: No results for input(s): VITAMINB12, FOLATE, FERRITIN, TIBC, IRON, RETICCTPCT in the last 72 hours. Sepsis Labs: Recent Labs  Lab 06/23/20 1429  LATICACIDVEN 1.0     Recent Results (from the past 240 hour(s))  Urine culture     Status: Abnormal   Collection Time: 06/23/20  2:53 PM   Specimen: Urine, Random  Result Value Ref Range Status   Specimen Description URINE, RANDOM  Final   Special Requests   Final    NONE Performed at [facility], [place] [address], [place], [place] [address]    Culture >=100,000 COLONIES/mL ESCHERICHIA COLI (A)  Final   Report Status 06/25/2020 FINAL  Final   Organism ID, Bacteria ESCHERICHIA COLI (A)  Final       Susceptibility   Escherichia coli - MIC*    AMPICILLIN 8 SENSITIVE Sensitive     CEFAZOLIN <=4 SENSITIVE Sensitive     CEFEPIME <=0.12 SENSITIVE Sensitive     CEFTRIAXONE <=0.25 SENSITIVE Sensitive     CIPROFLOXACIN <=0.25 SENSITIVE Sensitive     GENTAMICIN <=1 SENSITIVE Sensitive     IMIPENEM <=0.25 SENSITIVE Sensitive     NITROFURANTOIN <=16 SENSITIVE Sensitive     TRIMETH/SULFA <=20 SENSITIVE Sensitive     AMPICILLIN/SULBACTAM <=2 SENSITIVE Sensitive     PIP/TAZO <=4 SENSITIVE Sensitive     * >=100,000 COLONIES/mL ESCHERICHIA COLI  Resp Panel by RT-PCR (Flu A&B, Covid) Nasopharyngeal Swab     Status: None   Collection Time: 06/23/20  3:52 PM   Specimen: Nasopharyngeal Swab; Nasopharyngeal(NP) swabs in vial transport medium  Result Value Ref Range Status   SARS Coronavirus 2 by RT PCR NEGATIVE NEGATIVE Final    Comment: (NOTE) SARS-CoV-2 target nucleic acids are NOT DETECTED.  The SARS-CoV-2 RNA is generally detectable in upper respiratory specimens during the acute phase of infection. The lowest concentration of SARS-CoV-2 viral copies this assay can detect is 138 copies/mL. A negative result does not preclude SARS-Cov-2 infection and should not be used as the sole basis for treatment or other patient management decisions. A negative result may occur with  improper specimen collection/handling, submission of specimen other than nasopharyngeal swab, presence of viral mutation(s) within the areas targeted by this assay, and inadequate number of viral copies(<138 copies/mL). A negative result must be combined with clinical observations, patient history, and epidemiological information. The expected result is Negative.  Fact Sheet for Patients:  EntrepreneurPulse.com.au  Fact Sheet for  Healthcare Providers:  IncredibleEmployment.be  This test is no t yet approved or cleared by the [place] and  has been authorized for detection  and/or diagnosis of SARS-CoV-2 by FDA under an Emergency Use Authorization (EUA). This EUA will remain  in effect (meaning this test can be used) for the duration of the COVID-19 declaration under Section 564(b)(1) of the Act, 21 U.S.C.section 360bbb-3(b)(1), unless the authorization is terminated  or revoked sooner.       Influenza A by PCR NEGATIVE NEGATIVE Final   Influenza B by PCR NEGATIVE NEGATIVE Final    Comment: (NOTE) The Xpert Xpress SARS-CoV-2/FLU/RSV plus assay is intended as an aid in the diagnosis of influenza from Nasopharyngeal swab specimens and should not be used as a sole basis for treatment. Nasal washings and aspirates are unacceptable for Xpert Xpress SARS-CoV-2/FLU/RSV testing.  Fact Sheet for Patients: EntrepreneurPulse.com.au  Fact Sheet for Healthcare Providers: IncredibleEmployment.be  This test is not yet approved or cleared by the [place] FDA and has been authorized for detection and/or diagnosis of SARS-CoV-2 by FDA under an Emergency Use Authorization (EUA). This EUA will remain in effect (meaning this test can be used) for the duration of the COVID-19 declaration under Section 564(b)(1) of the Act, 21 U.S.C. section 360bbb-3(b)(1), unless the authorization is terminated or revoked.  Performed at [facility], [place] [address] [place], [place] [address]   SARS CORONAVIRUS 2 (TAT 6-24 HRS) Nasopharyngeal Nasopharyngeal Swab     Status: None   Collection Time: 06/26/20  2:48 PM   Specimen: Nasopharyngeal Swab  Result Value Ref Range Status   SARS Coronavirus 2 NEGATIVE NEGATIVE Final    Comment: (NOTE) SARS-CoV-2 target nucleic acids are NOT DETECTED.  The SARS-CoV-2 RNA is generally detectable in upper and lower respiratory specimens during the acute phase of infection. Negative results do not preclude SARS-CoV-2 infection, do not rule out co-infections with other pathogens, and should not be  used as the sole basis for treatment or other patient management decisions. Negative results must be combined with clinical observations, patient history, and epidemiological information. The expected result is Negative.  Fact Sheet for Patients: SugarRoll.be  Fact Sheet for Healthcare Providers: https://www.woods-mathews.com/  This test is not yet approved or cleared by the [place] FDA and  has been authorized for detection and/or diagnosis of SARS-CoV-2 by FDA under an Emergency Use Authorization (EUA). This EUA will remain  in effect (meaning this test can be used) for the duration of the COVID-19 declaration under Se ction 564(b)(1) of the Act, 21 U.S.C. section 360bbb-3(b)(1), unless the authorization is terminated or revoked sooner.  Performed at [facility], [place] [address] [place], [place] [address]        Radiology Studies: No results found.    Scheduled Meds:  vitamin C  1,000 mg Oral Daily   aspirin EC  81 mg Oral QPM   carvedilol  12.5 mg Oral BID   cephALEXin  500 mg Oral Q6H   citalopram  20 mg Oral q morning   enoxaparin (LOVENOX) injection  40 mg Subcutaneous Q24H   ferrous sulfate  325 mg Oral Daily   gabapentin  300 mg Oral QID   insulin aspart  0-15 Units Subcutaneous TID WC   magnesium oxide  400 mg Oral Daily   ticagrelor  90 mg Oral BID   vitamin B-12  2,500 mcg Oral Daily   Continuous Infusions:     LOS: 3 days  Time spent: 20 minutes   Dessa Phi, DO [facility] 06/27/2020, 3:09 PM   Available via Epic secure chat 7am-7pm After these hours, please refer to coverage provider listed on [email or web address]

## 2020-06-27 NOTE — TOC Progression Note (Addendum)
Transition of Care [facility]) - Progression Note    Patient Details  Name: Stepfanie Yott Menzel MRN: [id number] Date of Birth: 12-May-1940  [facility]) CM/SW Contact  Reece Agar, [place] Phone Number: 06/27/2020, 1:07 PM  Clinical Narrative:    59: CSW spoke with pt brother to provide SNF options, he wants CSW to speak with pt bc he cannot remember the names of the facilities pt was previously in after fall. CSW will follow up.  1400: CSW spoke with pt about SNF options, pt stated she likes [facility] and [facility]. CSW contacted both facilities; Claiborne Billings at [facility] does not have any availability and Janie at [facility] was not available, CSW left message and will follow up with [facility]. CSW will share other SNF options with pt if [facility] not have bed available.  N[facility] Rutherford returned CSW call to explain that she would look to see if bed was available for pt and will follow up with offer soon.  1500: Janie at [facility] was able to make an offer, CSW faxed out clinicals for auth.       Expected Discharge Plan and Services                                                 Social Determinants of Health (SDOH) Interventions    Readmission Risk Interventions No flowsheet data found.

## 2020-06-27 NOTE — Progress Notes (Signed)
Orthopedic Tech Progress Note Patient Details:  Dakoda Bassette Selvey 05-08-1940 [id number]  Spoke with secretary earlier, thought she said patient needed [employer]. Wrong patient   Patient ID: Kataleyah L Malveaux, female   DOB: 1940-01-27, 80 y.o.   MRN: [id number]  Janit Pagan 06/27/2020, 5:36 PM

## 2020-06-28 DIAGNOSIS — N182 Chronic kidney disease, stage 2 (mild): Secondary | ICD-10-CM | POA: Diagnosis not present

## 2020-06-28 DIAGNOSIS — E785 Hyperlipidemia, unspecified: Secondary | ICD-10-CM | POA: Diagnosis not present

## 2020-06-28 DIAGNOSIS — Z741 Need for assistance with personal care: Secondary | ICD-10-CM | POA: Diagnosis not present

## 2020-06-28 DIAGNOSIS — R2681 Unsteadiness on feet: Secondary | ICD-10-CM | POA: Diagnosis not present

## 2020-06-28 DIAGNOSIS — G8929 Other chronic pain: Secondary | ICD-10-CM | POA: Diagnosis not present

## 2020-06-28 DIAGNOSIS — D649 Anemia, unspecified: Secondary | ICD-10-CM | POA: Diagnosis not present

## 2020-06-28 DIAGNOSIS — I2510 Atherosclerotic heart disease of native coronary artery without angina pectoris: Secondary | ICD-10-CM | POA: Diagnosis not present

## 2020-06-28 DIAGNOSIS — E119 Type 2 diabetes mellitus without complications: Secondary | ICD-10-CM | POA: Diagnosis not present

## 2020-06-28 DIAGNOSIS — F32A Depression, unspecified: Secondary | ICD-10-CM | POA: Diagnosis not present

## 2020-06-28 DIAGNOSIS — I509 Heart failure, unspecified: Secondary | ICD-10-CM | POA: Diagnosis not present

## 2020-06-28 DIAGNOSIS — I10 Essential (primary) hypertension: Secondary | ICD-10-CM | POA: Diagnosis not present

## 2020-06-28 DIAGNOSIS — D333 Benign neoplasm of cranial nerves: Secondary | ICD-10-CM | POA: Diagnosis not present

## 2020-06-28 DIAGNOSIS — J9601 Acute respiratory failure with hypoxia: Secondary | ICD-10-CM | POA: Diagnosis not present

## 2020-06-28 DIAGNOSIS — I639 Cerebral infarction, unspecified: Secondary | ICD-10-CM | POA: Diagnosis not present

## 2020-06-28 DIAGNOSIS — N390 Urinary tract infection, site not specified: Secondary | ICD-10-CM | POA: Diagnosis not present

## 2020-06-28 DIAGNOSIS — R55 Syncope and collapse: Secondary | ICD-10-CM | POA: Diagnosis not present

## 2020-06-28 DIAGNOSIS — R262 Difficulty in walking, not elsewhere classified: Secondary | ICD-10-CM | POA: Diagnosis not present

## 2020-06-28 DIAGNOSIS — I5032 Chronic diastolic (congestive) heart failure: Secondary | ICD-10-CM | POA: Diagnosis not present

## 2020-06-28 DIAGNOSIS — R404 Transient alteration of awareness: Secondary | ICD-10-CM | POA: Diagnosis not present

## 2020-06-28 DIAGNOSIS — M6281 Muscle weakness (generalized): Secondary | ICD-10-CM | POA: Diagnosis not present

## 2020-06-28 DIAGNOSIS — Z9181 History of falling: Secondary | ICD-10-CM | POA: Diagnosis not present

## 2020-06-28 DIAGNOSIS — N3000 Acute cystitis without hematuria: Secondary | ICD-10-CM | POA: Diagnosis not present

## 2020-06-28 DIAGNOSIS — Z743 Need for continuous supervision: Secondary | ICD-10-CM | POA: Diagnosis not present

## 2020-06-28 DIAGNOSIS — G629 Polyneuropathy, unspecified: Secondary | ICD-10-CM | POA: Diagnosis not present

## 2020-06-28 DIAGNOSIS — E1165 Type 2 diabetes mellitus with hyperglycemia: Secondary | ICD-10-CM | POA: Diagnosis not present

## 2020-06-28 LAB — GLUCOSE, CAPILLARY
Glucose-Capillary: 172 mg/dL — ABNORMAL HIGH (ref 70–99)
Glucose-Capillary: 252 mg/dL — ABNORMAL HIGH (ref 70–99)

## 2020-06-28 MED ORDER — CARVEDILOL 12.5 MG PO TABS: 12.5000 mg | ORAL_TABLET | Freq: Two times a day (BID) | ORAL | 2 refills | Status: DC

## 2020-06-28 MED ORDER — HYDROCODONE-ACETAMINOPHEN 10-325 MG PO TABS
1.0000 | ORAL_TABLET | Freq: Four times a day (QID) | ORAL | Status: DC | PRN
  Administered 2020-06-28: 1 via ORAL

## 2020-06-28 MED ORDER — HYDROCODONE-ACETAMINOPHEN 10-325 MG PO TABS: 1.0000 | ORAL_TABLET | Freq: Two times a day (BID) | ORAL | 0 refills | Status: DC | PRN

## 2020-06-28 NOTE — TOC Transition Note (Signed)
Transition of Care [facility]) - CM/SW Discharge Note   Patient Details  Name: Andrea Kirk MRN: [id number] Date of Birth: [birth year]  [facility]) CM/SW Contact:  Bethann Berkshire, [place] Phone Number: 06/28/2020, 11:28 AM   Clinical Narrative:     Patient will DC to: [facility] Anticipated DC date: 06/28/20 Family notified: Jori Moll [place]tas Transport by: Corey Harold   Per MD patient ready for DC to [facility]. RN, patient, patient's family, and facility notified of DC. Discharge Summary and FL2 sent to facility. RN to call report prior to discharge [phone] room 3205). DC packet on chart. Ambulance transport requested for patient.   CSW will sign off for now as social work intervention is no longer needed. Please consult [place] again if new needs arise.   Final next level of care: Skilled Nursing Facility Barriers to Discharge: No Barriers Identified   Patient Goals and CMS Choice Patient states their goals for this hospitalization and ongoing recovery are:: [facility]      Discharge Placement              Patient chooses bed at: [facility] Patient to be transferred to facility by: [facility] Name of family member notified: Homero Fellers [place] Patient and family notified of of transfer: 06/28/20  Discharge Plan and Services                                     Social Determinants of Health (SDOH) Interventions     Readmission Risk Interventions No flowsheet data found.

## 2020-06-28 NOTE — Consult Note (Signed)
   [facility] CM Inpatient Consult   06/28/2020  Andrea Kirk February 04, 1940 [id number]  [place] Organization [ACO] Patient: [employer]  Patient will will have post hospital transitional care needs met at a  post facility care for complex disease management.  Plan:  No community follow up from [facility] CM at this time indicated due to transitioning to [facility] noted.  For questions or referrals, please contact:   Natividad Brood, RN BSN [facility] Liaison  [phone] business mobile phone Toll free office [phone]  Fax number: [phone] [email or web address]

## 2020-06-28 NOTE — Care Management Important Message (Signed)
Important Message  Patient Details  Name: Andrea Kirk MRN: [id number] Date of Birth: [birth year]   Medicare Important Message Given:  Yes     Chia Mowers 06/28/2020, 4:20 PM

## 2020-06-28 NOTE — Discharge Summary (Signed)
Physician Discharge Summary  Andrea Kirk KZS:[id number] DOB: Nov 05, 1940 DOA: 06/23/2020  PCP: Aura Dials, MD  Admit date: 06/23/2020 Discharge date: 06/28/2020  Disposition:  SNF   Recommendations for Outpatient Follow-up:  Follow up with PCP in 1 week  Discharge Condition: Stable CODE STATUS: Full code Diet recommendation: Heart healthy diet  Brief/Interim Summary: Andrea Kirk is a 81 y.o. female with medical history significant of CVA, HTN, IIDM, diabetic neuropathy, chronic ambulation dysfunction with shuffling gait, vertigo on as needed Antivert, stable vestibular schwannoma, CAD status post CABG, presented with fall and confusion.   Patient reported that she was at home and standing and started to feel vertigo and loss of balance and fell down.  Feeling weak and unable to support herself she remained on the floor through the night and was found by facility staff in the morning.  She complained about burning sensation with urination for last 2 days.   Patient's brother reported that patient has had 3 falls in the last 68-month, at baseline patient has unsteady gait and tends to fall on uneven ground.  It appears that the patient was trying to get out of bed at night but on the right side of the bed and fell and trapped between her bed and the wall, and unable to get out.  Per the report of the patient worsening of her ambulation dysfunction/shuffling gait lately, and brother recommend she keep up using the cane and instead use the walker.  Patient was diagnosed with a E. coli UTI, treated with antibiotics.  Patient's orthostatic hypotension continue to improve.  She work with PT OT who recommended SNF placement.  Discharge Diagnoses:  Principal Problem:   Syncope, vasovagal Active Problems:   Coronary artery disease   Diabetes mellitus (HCC)   Acute UTI   Depression   Unilateral vestibular schwannoma (HCC)   Falls   Chronic back pain   Essential hypertension   Fall  versus syncope -Unclear if she had an actual syncopal episode or a mechanical fall -CT head negative for acute intracranial abnormality, showed remote left cerebellar infarct -CT cervical spine negative for acute fracture or subluxation -Pelvic x-ray negative for fracture of the pelvis -CK improved 436 >> 236 -Orthostatic VS 138/75 HR 83 --> 122/85 HR 112 -Repeat orthostatic VS improved 131/59 HR 74 --> 109/65 HR 89 -Had a recent echocardiogram, did not show aortic stenosis -PT OT recommending SNF placement   E. coli UTI, present on admission -Pansensitive -Rocephin --> de-escalate to Keflex.  Completed antibiotic course   Hx CVA -Aspirin, brilinta   Chronic diastolic CHF -Does not appear fluid overloaded on exam   HTN -We will decrease Coreg dose, discontinue Norvasc in setting of orthostatic hypotension   DM type 2 -Continue NovoLog sliding scale insulin   Depression -Continue Celexa   Stable vestibular schwannoma -Followed by Dr. Constance Holster, has had serial MRI which had remained stable in size    Discharge Instructions  Discharge Instructions     Diet - low sodium heart healthy   Complete by: As directed    Increase activity slowly   Complete by: As directed       Allergies as of 06/28/2020       Reactions   Codeine Other (See Comments)   Abnormal behavior   Latex Rash, Other (See Comments)   tears skin   Morphine And Related Other (See Comments)   Affects BP and blood sugar.   Cyclobenzaprine    MADE PT SICK- pt unsure  if med was cyclobenzaprine or methocarbamol    Methocarbamol Other (See Comments)   MADE PT SICK- pt unsure if med was cyclobenzaprine or methocarbamol    Morphine Other (See Comments)   Unknown reaction        Medication List     STOP taking these medications    amLODipine 5 MG tablet Commonly known as: NORVASC   promethazine 12.5 MG tablet Commonly known as: PHENERGAN       TAKE these medications    acetaminophen 325 MG  tablet Commonly known as: TYLENOL Take 650 mg by mouth every 6 (six) hours as needed for moderate pain.   aspirin EC 81 MG tablet Take 81 mg by mouth every evening.   B-12 1000 MCG Tabs Take 1,000 mcg by mouth daily.   carvedilol 12.5 MG tablet Commonly known as: COREG Take 1 tablet (12.5 mg total) by mouth 2 (two) times daily. What changed:  medication strength how much to take   citalopram 20 MG tablet Commonly known as: CELEXA Take 20 mg by mouth every morning.   FERROUS SULFATE PO Take 325 mg by mouth daily.   gabapentin 300 MG capsule Commonly known as: NEURONTIN Take 300 mg by mouth 4 (four) times daily.   glipiZIDE 5 MG 24 hr tablet Commonly known as: GLUCOTROL XL Take 1 tablet (5 mg total) by mouth daily with breakfast.   HYDROcodone-acetaminophen 10-325 MG tablet Commonly known as: NORCO Take 1 tablet by mouth 2 (two) times daily as needed for severe pain.   Magnesium Oxide 250 MG Tabs Take 250 mg by mouth daily.   meclizine 25 MG tablet Commonly known as: ANTIVERT Take 25 mg by mouth 2 (two) times daily as needed for dizziness. For dizziness   methocarbamol 500 MG tablet Commonly known as: ROBAXIN Take 500 mg by mouth 3 (three) times daily.   nitroGLYCERIN 0.4 MG SL tablet Commonly known as: NITROSTAT Place 1 tablet (0.4 mg total) under the tongue every 5 (five) minutes as needed. Chest pain.   pantoprazole 40 MG tablet Commonly known as: PROTONIX Take 40 mg by mouth daily as needed (INDIGESTION).   rosuvastatin 5 MG tablet Commonly known as: CRESTOR Take 5 mg by mouth daily. Takes on Monday and Friday   ticagrelor 90 MG Tabs tablet Commonly known as: BRILINTA Take 1 tablet (90 mg total) by mouth 2 (two) times daily.   vitamin C 1000 MG tablet Take 1,000 mg by mouth daily.        Follow-up Information     Aura Dials, MD. Go on 07/07/2020.   Specialty: Family Medicine Why: @3 :30pm Contact information: [place]  [place] [address] [phone]                Allergies  Allergen Reactions   Codeine Other (See Comments)    Abnormal behavior    Latex Rash and Other (See Comments)    tears skin    Morphine And Related Other (See Comments)    Affects BP and blood sugar.   Cyclobenzaprine     MADE PT SICK- pt unsure if med was cyclobenzaprine or methocarbamol    Methocarbamol Other (See Comments)    MADE PT SICK- pt unsure if med was cyclobenzaprine or methocarbamol    Morphine Other (See Comments)    Unknown reaction    Consultations: None    Procedures/Studies: DG Chest 2 View  Result Date: 06/23/2020 CLINICAL DATA:  Trip and fall at home. EXAM:  CHEST - 2 VIEW COMPARISON:  04/16/2020 FINDINGS: Post median sternotomy and CABG. Upper normal heart size with stable mediastinal contours. Aortic atherosclerosis. No pneumothorax, acute airspace disease, pleural effusion or pulmonary edema. Mild right greater than left basilar atelectasis. Broad-based scoliotic curvature of the thoracic spine without acute osseous abnormalities. The bones are under mineralized. IMPRESSION: 1. No acute chest findings. 2. Mild right greater than left basilar atelectasis. Electronically Signed   By: Keith Rake M.D.   On: 06/23/2020 16:11   DG Pelvis 1-2 Views  Result Date: 06/23/2020 CLINICAL DATA:  Trip and fall at home. EXAM: PELVIS - 1-2 VIEW COMPARISON:  None. FINDINGS: The cortical margins of the bony pelvis are intact. No fracture. Pubic symphysis and sacroiliac joints are congruent. Both femoral heads are well-seated in the respective acetabula. Age related degenerative spurring of the acetabula. Surgical hardware in the lower lumbar spine is partially included. IMPRESSION: No fracture of the pelvis. Electronically Signed   By: Keith Rake M.D.   On: 06/23/2020 16:12   CT Head Wo Contrast  Result Date: 06/23/2020 CLINICAL DATA:  Head trauma, minor (Age >= 65y) Tripped at home leading to fall. EXAM: CT  HEAD WITHOUT CONTRAST TECHNIQUE: Contiguous axial images were obtained from the base of the skull through the vertex without intravenous contrast. COMPARISON:  Head CT and brain MRI 04/16/2020. FINDINGS: Brain: No definite correlate for the small acute infarct in the left temporal lobe on prior MRI. No intracranial hemorrhage, mass effect, or midline shift. No hydrocephalus. The basilar cisterns are patent. Remote left cerebellar infarct again seen. No evidence of territorial infarct or acute ischemia. No extra-axial or intracranial fluid collection. Vascular: Atherosclerosis of skullbase vasculature without hyperdense vessel or abnormal calcification. Skull: No fracture or focal lesion. Sinuses/Orbits: Mucosal thickening of ethmoid air cells fluid level in left side of sphenoid sinus is new from prior exam, there is no increased fluid density to suggest hemosinus. No visualized fracture. Mastoid air cells are clear. Acute orbital findings Other: None. IMPRESSION: 1. No acute intracranial abnormality. No skull fracture. 2. Remote left cerebellar infarct. No definite CT correlate for the recent small left temporal infarct on MRI. Electronically Signed   By: Keith Rake M.D.   On: 06/23/2020 16:06   CT Cervical Spine Wo Contrast  Result Date: 06/23/2020 CLINICAL DATA:  Neck trauma (Age >= 65y) Trip and fall at home. EXAM: CT CERVICAL SPINE WITHOUT CONTRAST TECHNIQUE: Multidetector CT imaging of the cervical spine was performed without intravenous contrast. Multiplanar CT image reconstructions were also generated. COMPARISON:  04/16/2020 FINDINGS: Alignment: No traumatic subluxation. Again seen straightening of normal lordosis and minimal anterolisthesis of C7 on T1. Skull base and vertebrae: No acute fracture. Vertebral body heights are maintained. The dens and skull base are intact. Stable C1-C2 degenerative change with pannus formation. Soft tissues and spinal canal: No prevertebral fluid or swelling. No  visible canal hematoma. Scattered surgical clips in the neck soft tissues. 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 There is mild multilevel facet hypertrophy. Upper chest: No acute or unexpected findings Other: None. IMPRESSION: 1. No acute fracture or subluxation of the cervical spine. 2. Stable degenerative change from prior exam. Again seen loss of normal cervical lordosis. Electronically Signed   By: Keith Rake M.D.   On: 06/23/2020 16:10       Discharge Exam: Vitals:   06/27/20 2038 06/28/20 0403  BP: (!) 157/64 121/61  Pulse: 71 65  Resp: 16 16  Temp: (!) 97.4 F (36.3 C)   SpO2: 100% 100%    General: Pt is alert, awake, not in acute distress Cardiovascular: RRR, S1/S2 +, no edema Respiratory: CTA bilaterally, no wheezing, no rhonchi, no respiratory distress, no conversational dyspnea  Abdominal: Soft, NT, ND, bowel sounds + Extremities: no edema, no cyanosis Psych: Normal mood and affect, stable judgement and insight     The results of significant diagnostics from this hospitalization (including imaging, microbiology, ancillary and laboratory) are listed below for reference.     Microbiology: Recent Results (from the past 240 hour(s))  Urine culture     Status: Abnormal   Collection Time: 06/23/20  2:53 PM   Specimen: Urine, Random  Result Value Ref Range Status   Specimen Description URINE, RANDOM  Final   Special Requests   Final    NONE Performed at [facility], [address], [place], [place] [address]    Culture >=100,000 COLONIES/mL ESCHERICHIA COLI (A)  Final   Report Status 06/25/2020 FINAL  Final   Organism ID, Bacteria ESCHERICHIA COLI (A)  Final      Susceptibility   Escherichia coli - MIC*    AMPICILLIN 8 SENSITIVE Sensitive     CEFAZOLIN <=4 SENSITIVE Sensitive     CEFEPIME <=0.12 SENSITIVE Sensitive     CEFTRIAXONE <=0.25 SENSITIVE Sensitive     CIPROFLOXACIN <=0.25 SENSITIVE  Sensitive     GENTAMICIN <=1 SENSITIVE Sensitive     IMIPENEM <=0.25 SENSITIVE Sensitive     NITROFURANTOIN <=16 SENSITIVE Sensitive     TRIMETH/SULFA <=20 SENSITIVE Sensitive     AMPICILLIN/SULBACTAM <=2 SENSITIVE Sensitive     PIP/TAZO <=4 SENSITIVE Sensitive     * >=100,000 COLONIES/mL ESCHERICHIA COLI  Resp Panel by RT-PCR (Flu A&B, Covid) Nasopharyngeal Swab     Status: None   Collection Time: 06/23/20  3:52 PM   Specimen: Nasopharyngeal Swab; Nasopharyngeal(NP) swabs in vial transport medium  Result Value Ref Range Status   SARS Coronavirus 2 by RT PCR NEGATIVE NEGATIVE Final    Comment: (NOTE) SARS-CoV-2 target nucleic acids are NOT DETECTED.  The SARS-CoV-2 RNA is generally detectable in upper respiratory specimens during the acute phase of infection. The lowest concentration of SARS-CoV-2 viral copies this assay can detect is 138 copies/mL. A negative result does not preclude SARS-Cov-2 infection and should not be used as the sole basis for treatment or other patient management decisions. A negative result may occur with  improper specimen collection/handling, submission of specimen other than nasopharyngeal swab, presence of viral mutation(s) within the areas targeted by this assay, and inadequate number of viral copies(<138 copies/mL). A negative result must be combined with clinical observations, patient history, and epidemiological information. The expected result is Negative.  Fact Sheet for Patients:  EntrepreneurPulse.com.au  Fact Sheet for Healthcare Providers:  IncredibleEmployment.be  This test is no t yet approved or cleared by the [place] FDA and  has been authorized for detection and/or diagnosis of SARS-CoV-2 by FDA under an Emergency Use Authorization (EUA). This EUA will remain  in effect (meaning this test can be used) for the duration of the COVID-19 declaration under Section 564(b)(1) of the Act,  21 U.S.C.section 360bbb-3(b)(1), unless the authorization is terminated  or revoked sooner.       Influenza A by PCR NEGATIVE NEGATIVE Final   Influenza B by PCR NEGATIVE NEGATIVE Final    Comment: (NOTE) The Xpert Xpress SARS-CoV-2/FLU/RSV plus assay is intended as an aid in the diagnosis of  influenza from Nasopharyngeal swab specimens and should not be used as a sole basis for treatment. Nasal washings and aspirates are unacceptable for Xpert Xpress SARS-CoV-2/FLU/RSV testing.  Fact Sheet for Patients: EntrepreneurPulse.com.au  Fact Sheet for Healthcare Providers: IncredibleEmployment.be  This test is not yet approved or cleared by the [place] FDA and has been authorized for detection and/or diagnosis of SARS-CoV-2 by FDA under an Emergency Use Authorization (EUA). This EUA will remain in effect (meaning this test can be used) for the duration of the COVID-19 declaration under Section 564(b)(1) of the Act, 21 U.S.C. section 360bbb-3(b)(1), unless the authorization is terminated or revoked.  Performed at [facility], [place] [address], [place], [place] [address]   SARS CORONAVIRUS 2 (TAT 6-24 HRS) Nasopharyngeal Nasopharyngeal Swab     Status: None   Collection Time: 06/26/20  2:48 PM   Specimen: Nasopharyngeal Swab  Result Value Ref Range Status   SARS Coronavirus 2 NEGATIVE NEGATIVE Final    Comment: (NOTE) SARS-CoV-2 target nucleic acids are NOT DETECTED.  The SARS-CoV-2 RNA is generally detectable in upper and lower respiratory specimens during the acute phase of infection. Negative results do not preclude SARS-CoV-2 infection, do not rule out co-infections with other pathogens, and should not be used as the sole basis for treatment or other patient management decisions. Negative results must be combined with clinical observations, patient history, and epidemiological information. The expected result is Negative.  Fact  Sheet for Patients: SugarRoll.be  Fact Sheet for Healthcare Providers: https://www.woods-mathews.com/  This test is not yet approved or cleared by the [place] FDA and  has been authorized for detection and/or diagnosis of SARS-CoV-2 by FDA under an Emergency Use Authorization (EUA). This EUA will remain  in effect (meaning this test can be used) for the duration of the COVID-19 declaration under Se ction 564(b)(1) of the Act, 21 U.S.C. section 360bbb-3(b)(1), unless the authorization is terminated or revoked sooner.  Performed at [facility], [place] [address], [place], [place] [address]      Labs: BNP (last 3 results) No results for input(s): BNP in the last 8760 hours. Basic Metabolic Panel: Recent Labs  Lab 06/23/20 1500 06/23/20 1916 06/24/20 0339  NA 139 143  143 138  K 4.3 4.5  4.5 3.5  CL 111  --  106  CO2 17*  --  21*  GLUCOSE 207*  --  145*  BUN 19  --  15  CREATININE 0.65  --  0.55  CALCIUM 10.4*  --  10.0   Liver Function Tests: Recent Labs  Lab 06/23/20 1500 06/24/20 0339  AST 71* 43*  ALT 54* 40  ALKPHOS 164* 131*  BILITOT 1.6* 1.1  PROT 7.2 6.7  ALBUMIN 3.0* 2.5*   No results for input(s): LIPASE, AMYLASE in the last 168 hours. No results for input(s): AMMONIA in the last 168 hours. CBC: Recent Labs  Lab 06/23/20 1500 06/23/20 1916 06/24/20 0758  WBC 6.9  --   --   NEUTROABS 5.2  --   --   HGB 10.0* 21.8*  21.8* 9.1*  HCT 30.9* 64.0*  64.0* 27.3*  MCV 111.2*  --   --   PLT 215  --   --    Cardiac Enzymes: Recent Labs  Lab 06/23/20 1500 06/24/20 0339  CKTOTAL 436* 236*   BNP: Invalid input(s): POCBNP CBG: Recent Labs  Lab 06/27/20 0601 06/27/20 1114 06/27/20 1617 06/27/20 2120 06/28/20 0555  GLUCAP 185* 268* 243* 215* 172*  D-Dimer No results for input(s): DDIMER in the last 72 hours. Hgb A1c No results for input(s): HGBA1C in the last 72 hours. Lipid Profile No  results for input(s): CHOL, HDL, LDLCALC, TRIG, CHOLHDL, LDLDIRECT in the last 72 hours. Thyroid function studies No results for input(s): TSH, T4TOTAL, T3FREE, THYROIDAB in the last 72 hours.  Invalid input(s): FREET3 Anemia work up No results for input(s): VITAMINB12, FOLATE, FERRITIN, TIBC, IRON, RETICCTPCT in the last 72 hours. Urinalysis    Component Value Date/Time   COLORURINE YELLOW 06/23/2020 1453   APPEARANCEUR HAZY (A) 06/23/2020 1453   LABSPEC 1.014 06/23/2020 1453   PHURINE 6.0 06/23/2020 1453   GLUCOSEU NEGATIVE 06/23/2020 1453   HGBUR MODERATE (A) 06/23/2020 1453   BILIRUBINUR NEGATIVE 06/23/2020 1453   KETONESUR 20 (A) 06/23/2020 1453   PROTEINUR 30 (A) 06/23/2020 1453   UROBILINOGEN 1.0 06/17/2014 2145   NITRITE NEGATIVE 06/23/2020 1453   LEUKOCYTESUR LARGE (A) 06/23/2020 1453   Sepsis Labs Invalid input(s): PROCALCITONIN,  WBC,  LACTICIDVEN Microbiology Recent Results (from the past 240 hour(s))  Urine culture     Status: Abnormal   Collection Time: 06/23/20  2:53 PM   Specimen: Urine, Random  Result Value Ref Range Status   Specimen Description URINE, RANDOM  Final   Special Requests   Final    NONE Performed at [facility], [place] [address] [place], [place] [address]    Culture >=100,000 COLONIES/mL ESCHERICHIA COLI (A)  Final   Report Status 06/25/2020 FINAL  Final   Organism ID, Bacteria ESCHERICHIA COLI (A)  Final      Susceptibility   Escherichia coli - MIC*    AMPICILLIN 8 SENSITIVE Sensitive     CEFAZOLIN <=4 SENSITIVE Sensitive     CEFEPIME <=0.12 SENSITIVE Sensitive     CEFTRIAXONE <=0.25 SENSITIVE Sensitive     CIPROFLOXACIN <=0.25 SENSITIVE Sensitive     GENTAMICIN <=1 SENSITIVE Sensitive     IMIPENEM <=0.25 SENSITIVE Sensitive     NITROFURANTOIN <=16 SENSITIVE Sensitive     TRIMETH/SULFA <=20 SENSITIVE Sensitive     AMPICILLIN/SULBACTAM <=2 SENSITIVE Sensitive     PIP/TAZO <=4 SENSITIVE Sensitive     * >=100,000 COLONIES/mL  ESCHERICHIA COLI  Resp Panel by RT-PCR (Flu A&B, Covid) Nasopharyngeal Swab     Status: None   Collection Time: 06/23/20  3:52 PM   Specimen: Nasopharyngeal Swab; Nasopharyngeal(NP) swabs in vial transport medium  Result Value Ref Range Status   SARS Coronavirus 2 by RT PCR NEGATIVE NEGATIVE Final    Comment: (NOTE) SARS-CoV-2 target nucleic acids are NOT DETECTED.  The SARS-CoV-2 RNA is generally detectable in upper respiratory specimens during the acute phase of infection. The lowest concentration of SARS-CoV-2 viral copies this assay can detect is 138 copies/mL. A negative result does not preclude SARS-Cov-2 infection and should not be used as the sole basis for treatment or other patient management decisions. A negative result may occur with  improper specimen collection/handling, submission of specimen other than nasopharyngeal swab, presence of viral mutation(s) within the areas targeted by this assay, and inadequate number of viral copies(<138 copies/mL). A negative result must be combined with clinical observations, patient history, and epidemiological information. The expected result is Negative.  Fact Sheet for Patients:  EntrepreneurPulse.com.au  Fact Sheet for Healthcare Providers:  IncredibleEmployment.be  This test is no t yet approved or cleared by the [place] FDA and  has been authorized for detection and/or diagnosis of SARS-CoV-2 by FDA under an Emergency Use  Authorization (EUA). This EUA will remain  in effect (meaning this test can be used) for the duration of the COVID-19 declaration under Section 564(b)(1) of the Act, 21 U.S.C.section 360bbb-3(b)(1), unless the authorization is terminated  or revoked sooner.       Influenza A by PCR NEGATIVE NEGATIVE Final   Influenza B by PCR NEGATIVE NEGATIVE Final    Comment: (NOTE) The Xpert Xpress SARS-CoV-2/FLU/RSV plus assay is intended as an aid in the diagnosis of  influenza from Nasopharyngeal swab specimens and should not be used as a sole basis for treatment. Nasal washings and aspirates are unacceptable for Xpert Xpress SARS-CoV-2/FLU/RSV testing.  Fact Sheet for Patients: EntrepreneurPulse.com.au  Fact Sheet for Healthcare Providers: IncredibleEmployment.be  This test is not yet approved or cleared by the [place] FDA and has been authorized for detection and/or diagnosis of SARS-CoV-2 by FDA under an Emergency Use Authorization (EUA). This EUA will remain in effect (meaning this test can be used) for the duration of the COVID-19 declaration under Section 564(b)(1) of the Act, 21 U.S.C. section 360bbb-3(b)(1), unless the authorization is terminated or revoked.  Performed at [facility], [place] [address], [place], [place] [address]   SARS CORONAVIRUS 2 (TAT 6-24 HRS) Nasopharyngeal Nasopharyngeal Swab     Status: None   Collection Time: 06/26/20  2:48 PM   Specimen: Nasopharyngeal Swab  Result Value Ref Range Status   SARS Coronavirus 2 NEGATIVE NEGATIVE Final    Comment: (NOTE) SARS-CoV-2 target nucleic acids are NOT DETECTED.  The SARS-CoV-2 RNA is generally detectable in upper and lower respiratory specimens during the acute phase of infection. Negative results do not preclude SARS-CoV-2 infection, do not rule out co-infections with other pathogens, and should not be used as the sole basis for treatment or other patient management decisions. Negative results must be combined with clinical observations, patient history, and epidemiological information. The expected result is Negative.  Fact Sheet for Patients: SugarRoll.be  Fact Sheet for Healthcare Providers: https://www.woods-mathews.com/  This test is not yet approved or cleared by the [place] FDA and  has been authorized for detection and/or diagnosis of SARS-CoV-2 by FDA under an  Emergency Use Authorization (EUA). This EUA will remain  in effect (meaning this test can be used) for the duration of the COVID-19 declaration under Se ction 564(b)(1) of the Act, 21 U.S.C. section 360bbb-3(b)(1), unless the authorization is terminated or revoked sooner.  Performed at [facility], [place] [address], [place], [place] [address]      Patient was seen and examined on the day of discharge and was found to be in stable condition. Time coordinating discharge: 25 minutes including assessment and coordination of care, as well as examination of the patient.   SIGNED:  Dessa Phi, DO [facility] 06/28/2020, 9:32 AM

## 2020-06-28 NOTE — Progress Notes (Signed)
Report called to Maudie Mercury at [facility].

## 2020-06-29 DIAGNOSIS — I2510 Atherosclerotic heart disease of native coronary artery without angina pectoris: Secondary | ICD-10-CM | POA: Diagnosis not present

## 2020-06-29 DIAGNOSIS — I5032 Chronic diastolic (congestive) heart failure: Secondary | ICD-10-CM | POA: Diagnosis not present

## 2020-06-29 DIAGNOSIS — D649 Anemia, unspecified: Secondary | ICD-10-CM | POA: Diagnosis not present

## 2020-06-29 DIAGNOSIS — F32A Depression, unspecified: Secondary | ICD-10-CM | POA: Diagnosis not present

## 2020-06-29 DIAGNOSIS — G629 Polyneuropathy, unspecified: Secondary | ICD-10-CM | POA: Diagnosis not present

## 2020-06-29 DIAGNOSIS — I10 Essential (primary) hypertension: Secondary | ICD-10-CM | POA: Diagnosis not present

## 2020-06-29 DIAGNOSIS — E785 Hyperlipidemia, unspecified: Secondary | ICD-10-CM | POA: Diagnosis not present

## 2020-06-29 DIAGNOSIS — N390 Urinary tract infection, site not specified: Secondary | ICD-10-CM | POA: Diagnosis not present

## 2020-06-29 DIAGNOSIS — I639 Cerebral infarction, unspecified: Secondary | ICD-10-CM | POA: Diagnosis not present

## 2020-06-29 DIAGNOSIS — E119 Type 2 diabetes mellitus without complications: Secondary | ICD-10-CM | POA: Diagnosis not present

## 2020-06-29 DIAGNOSIS — N182 Chronic kidney disease, stage 2 (mild): Secondary | ICD-10-CM | POA: Diagnosis not present

## 2020-06-29 DIAGNOSIS — D333 Benign neoplasm of cranial nerves: Secondary | ICD-10-CM | POA: Diagnosis not present

## 2020-06-30 ENCOUNTER — Ambulatory Visit: Payer: Medicare Other | Admitting: Cardiology

## 2020-07-03 DIAGNOSIS — N182 Chronic kidney disease, stage 2 (mild): Secondary | ICD-10-CM | POA: Diagnosis not present

## 2020-07-03 DIAGNOSIS — G629 Polyneuropathy, unspecified: Secondary | ICD-10-CM | POA: Diagnosis not present

## 2020-07-03 DIAGNOSIS — I5032 Chronic diastolic (congestive) heart failure: Secondary | ICD-10-CM | POA: Diagnosis not present

## 2020-07-03 DIAGNOSIS — I10 Essential (primary) hypertension: Secondary | ICD-10-CM | POA: Diagnosis not present

## 2020-07-11 DIAGNOSIS — G629 Polyneuropathy, unspecified: Secondary | ICD-10-CM | POA: Diagnosis not present

## 2020-07-11 DIAGNOSIS — E119 Type 2 diabetes mellitus without complications: Secondary | ICD-10-CM | POA: Diagnosis not present

## 2020-07-11 DIAGNOSIS — N3000 Acute cystitis without hematuria: Secondary | ICD-10-CM | POA: Diagnosis not present

## 2020-07-11 DIAGNOSIS — Z9181 History of falling: Secondary | ICD-10-CM | POA: Diagnosis not present

## 2020-07-11 DIAGNOSIS — N182 Chronic kidney disease, stage 2 (mild): Secondary | ICD-10-CM | POA: Diagnosis not present

## 2020-07-11 DIAGNOSIS — M549 Dorsalgia, unspecified: Secondary | ICD-10-CM | POA: Diagnosis not present

## 2020-07-11 DIAGNOSIS — I5032 Chronic diastolic (congestive) heart failure: Secondary | ICD-10-CM | POA: Diagnosis not present

## 2020-07-11 DIAGNOSIS — G8929 Other chronic pain: Secondary | ICD-10-CM | POA: Diagnosis not present

## 2020-07-11 DIAGNOSIS — I10 Essential (primary) hypertension: Secondary | ICD-10-CM | POA: Diagnosis not present

## 2020-07-11 DIAGNOSIS — R2681 Unsteadiness on feet: Secondary | ICD-10-CM | POA: Diagnosis not present

## 2020-07-11 DIAGNOSIS — Z741 Need for assistance with personal care: Secondary | ICD-10-CM | POA: Diagnosis not present

## 2020-07-11 DIAGNOSIS — I509 Heart failure, unspecified: Secondary | ICD-10-CM | POA: Diagnosis not present

## 2020-07-11 DIAGNOSIS — R55 Syncope and collapse: Secondary | ICD-10-CM | POA: Diagnosis not present

## 2020-07-11 DIAGNOSIS — F32A Depression, unspecified: Secondary | ICD-10-CM | POA: Diagnosis not present

## 2020-07-11 DIAGNOSIS — M6281 Muscle weakness (generalized): Secondary | ICD-10-CM | POA: Diagnosis not present

## 2020-07-11 DIAGNOSIS — I2510 Atherosclerotic heart disease of native coronary artery without angina pectoris: Secondary | ICD-10-CM | POA: Diagnosis not present

## 2020-07-12 DIAGNOSIS — E119 Type 2 diabetes mellitus without complications: Secondary | ICD-10-CM | POA: Diagnosis not present

## 2020-07-12 DIAGNOSIS — R55 Syncope and collapse: Secondary | ICD-10-CM | POA: Diagnosis not present

## 2020-07-12 DIAGNOSIS — I10 Essential (primary) hypertension: Secondary | ICD-10-CM | POA: Diagnosis not present

## 2020-07-12 DIAGNOSIS — I509 Heart failure, unspecified: Secondary | ICD-10-CM | POA: Diagnosis not present

## 2020-07-12 DIAGNOSIS — M6281 Muscle weakness (generalized): Secondary | ICD-10-CM | POA: Diagnosis not present

## 2020-07-12 DIAGNOSIS — G8929 Other chronic pain: Secondary | ICD-10-CM | POA: Diagnosis not present

## 2020-07-12 DIAGNOSIS — Z741 Need for assistance with personal care: Secondary | ICD-10-CM | POA: Diagnosis not present

## 2020-07-12 DIAGNOSIS — R2681 Unsteadiness on feet: Secondary | ICD-10-CM | POA: Diagnosis not present

## 2020-07-12 DIAGNOSIS — N182 Chronic kidney disease, stage 2 (mild): Secondary | ICD-10-CM | POA: Diagnosis not present

## 2020-07-12 DIAGNOSIS — N3000 Acute cystitis without hematuria: Secondary | ICD-10-CM | POA: Diagnosis not present

## 2020-07-12 DIAGNOSIS — Z9181 History of falling: Secondary | ICD-10-CM | POA: Diagnosis not present

## 2020-07-13 DIAGNOSIS — G8929 Other chronic pain: Secondary | ICD-10-CM | POA: Diagnosis not present

## 2020-07-13 DIAGNOSIS — N3000 Acute cystitis without hematuria: Secondary | ICD-10-CM | POA: Diagnosis not present

## 2020-07-13 DIAGNOSIS — E119 Type 2 diabetes mellitus without complications: Secondary | ICD-10-CM | POA: Diagnosis not present

## 2020-07-13 DIAGNOSIS — M6281 Muscle weakness (generalized): Secondary | ICD-10-CM | POA: Diagnosis not present

## 2020-07-13 DIAGNOSIS — R55 Syncope and collapse: Secondary | ICD-10-CM | POA: Diagnosis not present

## 2020-07-13 DIAGNOSIS — I10 Essential (primary) hypertension: Secondary | ICD-10-CM | POA: Diagnosis not present

## 2020-07-13 DIAGNOSIS — Z741 Need for assistance with personal care: Secondary | ICD-10-CM | POA: Diagnosis not present

## 2020-07-13 DIAGNOSIS — Z9181 History of falling: Secondary | ICD-10-CM | POA: Diagnosis not present

## 2020-07-13 DIAGNOSIS — R2681 Unsteadiness on feet: Secondary | ICD-10-CM | POA: Diagnosis not present

## 2020-07-13 DIAGNOSIS — N182 Chronic kidney disease, stage 2 (mild): Secondary | ICD-10-CM | POA: Diagnosis not present

## 2020-07-13 DIAGNOSIS — I509 Heart failure, unspecified: Secondary | ICD-10-CM | POA: Diagnosis not present

## 2020-07-14 DIAGNOSIS — Z9181 History of falling: Secondary | ICD-10-CM | POA: Diagnosis not present

## 2020-07-14 DIAGNOSIS — I10 Essential (primary) hypertension: Secondary | ICD-10-CM | POA: Diagnosis not present

## 2020-07-14 DIAGNOSIS — N182 Chronic kidney disease, stage 2 (mild): Secondary | ICD-10-CM | POA: Diagnosis not present

## 2020-07-14 DIAGNOSIS — I509 Heart failure, unspecified: Secondary | ICD-10-CM | POA: Diagnosis not present

## 2020-07-14 DIAGNOSIS — R2681 Unsteadiness on feet: Secondary | ICD-10-CM | POA: Diagnosis not present

## 2020-07-14 DIAGNOSIS — G8929 Other chronic pain: Secondary | ICD-10-CM | POA: Diagnosis not present

## 2020-07-14 DIAGNOSIS — M6281 Muscle weakness (generalized): Secondary | ICD-10-CM | POA: Diagnosis not present

## 2020-07-14 DIAGNOSIS — N3000 Acute cystitis without hematuria: Secondary | ICD-10-CM | POA: Diagnosis not present

## 2020-07-14 DIAGNOSIS — Z741 Need for assistance with personal care: Secondary | ICD-10-CM | POA: Diagnosis not present

## 2020-07-14 DIAGNOSIS — E119 Type 2 diabetes mellitus without complications: Secondary | ICD-10-CM | POA: Diagnosis not present

## 2020-07-14 DIAGNOSIS — R55 Syncope and collapse: Secondary | ICD-10-CM | POA: Diagnosis not present

## 2020-07-15 DIAGNOSIS — J9601 Acute respiratory failure with hypoxia: Secondary | ICD-10-CM | POA: Diagnosis not present

## 2020-07-15 DIAGNOSIS — G8929 Other chronic pain: Secondary | ICD-10-CM | POA: Diagnosis not present

## 2020-07-15 DIAGNOSIS — N182 Chronic kidney disease, stage 2 (mild): Secondary | ICD-10-CM | POA: Diagnosis not present

## 2020-07-15 DIAGNOSIS — N3000 Acute cystitis without hematuria: Secondary | ICD-10-CM | POA: Diagnosis not present

## 2020-07-15 DIAGNOSIS — R55 Syncope and collapse: Secondary | ICD-10-CM | POA: Diagnosis not present

## 2020-07-15 DIAGNOSIS — I10 Essential (primary) hypertension: Secondary | ICD-10-CM | POA: Diagnosis not present

## 2020-07-15 DIAGNOSIS — M6281 Muscle weakness (generalized): Secondary | ICD-10-CM | POA: Diagnosis not present

## 2020-07-15 DIAGNOSIS — E119 Type 2 diabetes mellitus without complications: Secondary | ICD-10-CM | POA: Diagnosis not present

## 2020-07-15 DIAGNOSIS — Z741 Need for assistance with personal care: Secondary | ICD-10-CM | POA: Diagnosis not present

## 2020-07-15 DIAGNOSIS — R2681 Unsteadiness on feet: Secondary | ICD-10-CM | POA: Diagnosis not present

## 2020-07-15 DIAGNOSIS — I509 Heart failure, unspecified: Secondary | ICD-10-CM | POA: Diagnosis not present

## 2020-07-15 DIAGNOSIS — Z9181 History of falling: Secondary | ICD-10-CM | POA: Diagnosis not present

## 2020-07-17 DIAGNOSIS — N3000 Acute cystitis without hematuria: Secondary | ICD-10-CM | POA: Diagnosis not present

## 2020-07-17 DIAGNOSIS — N182 Chronic kidney disease, stage 2 (mild): Secondary | ICD-10-CM | POA: Diagnosis not present

## 2020-07-17 DIAGNOSIS — E119 Type 2 diabetes mellitus without complications: Secondary | ICD-10-CM | POA: Diagnosis not present

## 2020-07-17 DIAGNOSIS — R2681 Unsteadiness on feet: Secondary | ICD-10-CM | POA: Diagnosis not present

## 2020-07-17 DIAGNOSIS — R55 Syncope and collapse: Secondary | ICD-10-CM | POA: Diagnosis not present

## 2020-07-17 DIAGNOSIS — G8929 Other chronic pain: Secondary | ICD-10-CM | POA: Diagnosis not present

## 2020-07-17 DIAGNOSIS — I10 Essential (primary) hypertension: Secondary | ICD-10-CM | POA: Diagnosis not present

## 2020-07-17 DIAGNOSIS — Z9181 History of falling: Secondary | ICD-10-CM | POA: Diagnosis not present

## 2020-07-17 DIAGNOSIS — I509 Heart failure, unspecified: Secondary | ICD-10-CM | POA: Diagnosis not present

## 2020-07-17 DIAGNOSIS — M6281 Muscle weakness (generalized): Secondary | ICD-10-CM | POA: Diagnosis not present

## 2020-07-17 DIAGNOSIS — Z741 Need for assistance with personal care: Secondary | ICD-10-CM | POA: Diagnosis not present

## 2020-07-18 DIAGNOSIS — I10 Essential (primary) hypertension: Secondary | ICD-10-CM | POA: Diagnosis not present

## 2020-07-18 DIAGNOSIS — R55 Syncope and collapse: Secondary | ICD-10-CM | POA: Diagnosis not present

## 2020-07-18 DIAGNOSIS — Z741 Need for assistance with personal care: Secondary | ICD-10-CM | POA: Diagnosis not present

## 2020-07-18 DIAGNOSIS — R2681 Unsteadiness on feet: Secondary | ICD-10-CM | POA: Diagnosis not present

## 2020-07-18 DIAGNOSIS — Z9181 History of falling: Secondary | ICD-10-CM | POA: Diagnosis not present

## 2020-07-18 DIAGNOSIS — M6281 Muscle weakness (generalized): Secondary | ICD-10-CM | POA: Diagnosis not present

## 2020-07-18 DIAGNOSIS — G8929 Other chronic pain: Secondary | ICD-10-CM | POA: Diagnosis not present

## 2020-07-18 DIAGNOSIS — E119 Type 2 diabetes mellitus without complications: Secondary | ICD-10-CM | POA: Diagnosis not present

## 2020-07-18 DIAGNOSIS — I509 Heart failure, unspecified: Secondary | ICD-10-CM | POA: Diagnosis not present

## 2020-07-18 DIAGNOSIS — N182 Chronic kidney disease, stage 2 (mild): Secondary | ICD-10-CM | POA: Diagnosis not present

## 2020-07-18 DIAGNOSIS — N3000 Acute cystitis without hematuria: Secondary | ICD-10-CM | POA: Diagnosis not present

## 2020-07-19 DIAGNOSIS — I509 Heart failure, unspecified: Secondary | ICD-10-CM | POA: Diagnosis not present

## 2020-07-19 DIAGNOSIS — G629 Polyneuropathy, unspecified: Secondary | ICD-10-CM | POA: Diagnosis not present

## 2020-07-19 DIAGNOSIS — N182 Chronic kidney disease, stage 2 (mild): Secondary | ICD-10-CM | POA: Diagnosis not present

## 2020-07-19 DIAGNOSIS — I5032 Chronic diastolic (congestive) heart failure: Secondary | ICD-10-CM | POA: Diagnosis not present

## 2020-07-19 DIAGNOSIS — M549 Dorsalgia, unspecified: Secondary | ICD-10-CM | POA: Diagnosis not present

## 2020-07-19 DIAGNOSIS — N3000 Acute cystitis without hematuria: Secondary | ICD-10-CM | POA: Diagnosis not present

## 2020-07-19 DIAGNOSIS — Z741 Need for assistance with personal care: Secondary | ICD-10-CM | POA: Diagnosis not present

## 2020-07-19 DIAGNOSIS — Z9181 History of falling: Secondary | ICD-10-CM | POA: Diagnosis not present

## 2020-07-19 DIAGNOSIS — R55 Syncope and collapse: Secondary | ICD-10-CM | POA: Diagnosis not present

## 2020-07-19 DIAGNOSIS — R2681 Unsteadiness on feet: Secondary | ICD-10-CM | POA: Diagnosis not present

## 2020-07-19 DIAGNOSIS — I10 Essential (primary) hypertension: Secondary | ICD-10-CM | POA: Diagnosis not present

## 2020-07-19 DIAGNOSIS — G8929 Other chronic pain: Secondary | ICD-10-CM | POA: Diagnosis not present

## 2020-07-19 DIAGNOSIS — M6281 Muscle weakness (generalized): Secondary | ICD-10-CM | POA: Diagnosis not present

## 2020-07-19 DIAGNOSIS — E119 Type 2 diabetes mellitus without complications: Secondary | ICD-10-CM | POA: Diagnosis not present

## 2020-07-20 DIAGNOSIS — M6281 Muscle weakness (generalized): Secondary | ICD-10-CM | POA: Diagnosis not present

## 2020-07-20 DIAGNOSIS — N3000 Acute cystitis without hematuria: Secondary | ICD-10-CM | POA: Diagnosis not present

## 2020-07-20 DIAGNOSIS — Z741 Need for assistance with personal care: Secondary | ICD-10-CM | POA: Diagnosis not present

## 2020-07-20 DIAGNOSIS — R2681 Unsteadiness on feet: Secondary | ICD-10-CM | POA: Diagnosis not present

## 2020-07-20 DIAGNOSIS — I10 Essential (primary) hypertension: Secondary | ICD-10-CM | POA: Diagnosis not present

## 2020-07-20 DIAGNOSIS — R55 Syncope and collapse: Secondary | ICD-10-CM | POA: Diagnosis not present

## 2020-07-20 DIAGNOSIS — Z9181 History of falling: Secondary | ICD-10-CM | POA: Diagnosis not present

## 2020-07-20 DIAGNOSIS — E119 Type 2 diabetes mellitus without complications: Secondary | ICD-10-CM | POA: Diagnosis not present

## 2020-07-20 DIAGNOSIS — N182 Chronic kidney disease, stage 2 (mild): Secondary | ICD-10-CM | POA: Diagnosis not present

## 2020-07-20 DIAGNOSIS — I509 Heart failure, unspecified: Secondary | ICD-10-CM | POA: Diagnosis not present

## 2020-07-20 DIAGNOSIS — G8929 Other chronic pain: Secondary | ICD-10-CM | POA: Diagnosis not present

## 2020-07-21 DIAGNOSIS — Z741 Need for assistance with personal care: Secondary | ICD-10-CM | POA: Diagnosis not present

## 2020-07-21 DIAGNOSIS — G8929 Other chronic pain: Secondary | ICD-10-CM | POA: Diagnosis not present

## 2020-07-21 DIAGNOSIS — I509 Heart failure, unspecified: Secondary | ICD-10-CM | POA: Diagnosis not present

## 2020-07-21 DIAGNOSIS — I10 Essential (primary) hypertension: Secondary | ICD-10-CM | POA: Diagnosis not present

## 2020-07-21 DIAGNOSIS — N3000 Acute cystitis without hematuria: Secondary | ICD-10-CM | POA: Diagnosis not present

## 2020-07-21 DIAGNOSIS — R55 Syncope and collapse: Secondary | ICD-10-CM | POA: Diagnosis not present

## 2020-07-21 DIAGNOSIS — Z9181 History of falling: Secondary | ICD-10-CM | POA: Diagnosis not present

## 2020-07-21 DIAGNOSIS — N182 Chronic kidney disease, stage 2 (mild): Secondary | ICD-10-CM | POA: Diagnosis not present

## 2020-07-21 DIAGNOSIS — R2681 Unsteadiness on feet: Secondary | ICD-10-CM | POA: Diagnosis not present

## 2020-07-21 DIAGNOSIS — M6281 Muscle weakness (generalized): Secondary | ICD-10-CM | POA: Diagnosis not present

## 2020-07-21 DIAGNOSIS — E119 Type 2 diabetes mellitus without complications: Secondary | ICD-10-CM | POA: Diagnosis not present

## 2020-07-21 DIAGNOSIS — N390 Urinary tract infection, site not specified: Secondary | ICD-10-CM | POA: Diagnosis not present

## 2020-07-23 DIAGNOSIS — M6281 Muscle weakness (generalized): Secondary | ICD-10-CM | POA: Diagnosis not present

## 2020-07-23 DIAGNOSIS — I10 Essential (primary) hypertension: Secondary | ICD-10-CM | POA: Diagnosis not present

## 2020-07-23 DIAGNOSIS — R55 Syncope and collapse: Secondary | ICD-10-CM | POA: Diagnosis not present

## 2020-07-23 DIAGNOSIS — Z741 Need for assistance with personal care: Secondary | ICD-10-CM | POA: Diagnosis not present

## 2020-07-23 DIAGNOSIS — N182 Chronic kidney disease, stage 2 (mild): Secondary | ICD-10-CM | POA: Diagnosis not present

## 2020-07-23 DIAGNOSIS — E119 Type 2 diabetes mellitus without complications: Secondary | ICD-10-CM | POA: Diagnosis not present

## 2020-07-23 DIAGNOSIS — Z9181 History of falling: Secondary | ICD-10-CM | POA: Diagnosis not present

## 2020-07-23 DIAGNOSIS — R2681 Unsteadiness on feet: Secondary | ICD-10-CM | POA: Diagnosis not present

## 2020-07-23 DIAGNOSIS — N3000 Acute cystitis without hematuria: Secondary | ICD-10-CM | POA: Diagnosis not present

## 2020-07-23 DIAGNOSIS — G8929 Other chronic pain: Secondary | ICD-10-CM | POA: Diagnosis not present

## 2020-07-23 DIAGNOSIS — I509 Heart failure, unspecified: Secondary | ICD-10-CM | POA: Diagnosis not present

## 2020-07-24 DIAGNOSIS — G629 Polyneuropathy, unspecified: Secondary | ICD-10-CM | POA: Diagnosis not present

## 2020-07-24 DIAGNOSIS — M549 Dorsalgia, unspecified: Secondary | ICD-10-CM | POA: Diagnosis not present

## 2020-07-24 DIAGNOSIS — N329 Bladder disorder, unspecified: Secondary | ICD-10-CM | POA: Diagnosis not present

## 2020-07-24 DIAGNOSIS — I5032 Chronic diastolic (congestive) heart failure: Secondary | ICD-10-CM | POA: Diagnosis not present

## 2020-07-25 DIAGNOSIS — I5032 Chronic diastolic (congestive) heart failure: Secondary | ICD-10-CM | POA: Diagnosis not present

## 2020-07-25 DIAGNOSIS — G629 Polyneuropathy, unspecified: Secondary | ICD-10-CM | POA: Diagnosis not present

## 2020-07-25 DIAGNOSIS — M6281 Muscle weakness (generalized): Secondary | ICD-10-CM | POA: Diagnosis not present

## 2020-07-25 DIAGNOSIS — N3000 Acute cystitis without hematuria: Secondary | ICD-10-CM | POA: Diagnosis not present

## 2020-07-25 DIAGNOSIS — G8929 Other chronic pain: Secondary | ICD-10-CM | POA: Diagnosis not present

## 2020-07-25 DIAGNOSIS — N182 Chronic kidney disease, stage 2 (mild): Secondary | ICD-10-CM | POA: Diagnosis not present

## 2020-07-25 DIAGNOSIS — R55 Syncope and collapse: Secondary | ICD-10-CM | POA: Diagnosis not present

## 2020-07-25 DIAGNOSIS — Z741 Need for assistance with personal care: Secondary | ICD-10-CM | POA: Diagnosis not present

## 2020-07-25 DIAGNOSIS — M549 Dorsalgia, unspecified: Secondary | ICD-10-CM | POA: Diagnosis not present

## 2020-07-25 DIAGNOSIS — Z9181 History of falling: Secondary | ICD-10-CM | POA: Diagnosis not present

## 2020-07-25 DIAGNOSIS — I509 Heart failure, unspecified: Secondary | ICD-10-CM | POA: Diagnosis not present

## 2020-07-25 DIAGNOSIS — N329 Bladder disorder, unspecified: Secondary | ICD-10-CM | POA: Diagnosis not present

## 2020-07-25 DIAGNOSIS — I10 Essential (primary) hypertension: Secondary | ICD-10-CM | POA: Diagnosis not present

## 2020-07-25 DIAGNOSIS — E119 Type 2 diabetes mellitus without complications: Secondary | ICD-10-CM | POA: Diagnosis not present

## 2020-07-25 DIAGNOSIS — F32A Depression, unspecified: Secondary | ICD-10-CM | POA: Diagnosis not present

## 2020-07-25 DIAGNOSIS — R2681 Unsteadiness on feet: Secondary | ICD-10-CM | POA: Diagnosis not present

## 2020-07-26 DIAGNOSIS — Z9181 History of falling: Secondary | ICD-10-CM | POA: Diagnosis not present

## 2020-07-26 DIAGNOSIS — G8929 Other chronic pain: Secondary | ICD-10-CM | POA: Diagnosis not present

## 2020-07-26 DIAGNOSIS — N182 Chronic kidney disease, stage 2 (mild): Secondary | ICD-10-CM | POA: Diagnosis not present

## 2020-07-26 DIAGNOSIS — I509 Heart failure, unspecified: Secondary | ICD-10-CM | POA: Diagnosis not present

## 2020-07-26 DIAGNOSIS — R55 Syncope and collapse: Secondary | ICD-10-CM | POA: Diagnosis not present

## 2020-07-26 DIAGNOSIS — I10 Essential (primary) hypertension: Secondary | ICD-10-CM | POA: Diagnosis not present

## 2020-07-26 DIAGNOSIS — E119 Type 2 diabetes mellitus without complications: Secondary | ICD-10-CM | POA: Diagnosis not present

## 2020-07-26 DIAGNOSIS — M6281 Muscle weakness (generalized): Secondary | ICD-10-CM | POA: Diagnosis not present

## 2020-07-26 DIAGNOSIS — R2681 Unsteadiness on feet: Secondary | ICD-10-CM | POA: Diagnosis not present

## 2020-07-26 DIAGNOSIS — N3000 Acute cystitis without hematuria: Secondary | ICD-10-CM | POA: Diagnosis not present

## 2020-07-26 DIAGNOSIS — Z741 Need for assistance with personal care: Secondary | ICD-10-CM | POA: Diagnosis not present

## 2020-07-27 DIAGNOSIS — M549 Dorsalgia, unspecified: Secondary | ICD-10-CM | POA: Diagnosis not present

## 2020-07-27 DIAGNOSIS — E119 Type 2 diabetes mellitus without complications: Secondary | ICD-10-CM | POA: Diagnosis not present

## 2020-07-27 DIAGNOSIS — I5032 Chronic diastolic (congestive) heart failure: Secondary | ICD-10-CM | POA: Diagnosis not present

## 2020-07-27 DIAGNOSIS — R55 Syncope and collapse: Secondary | ICD-10-CM | POA: Diagnosis not present

## 2020-07-27 DIAGNOSIS — M6281 Muscle weakness (generalized): Secondary | ICD-10-CM | POA: Diagnosis not present

## 2020-07-27 DIAGNOSIS — Z741 Need for assistance with personal care: Secondary | ICD-10-CM | POA: Diagnosis not present

## 2020-07-27 DIAGNOSIS — G629 Polyneuropathy, unspecified: Secondary | ICD-10-CM | POA: Diagnosis not present

## 2020-07-27 DIAGNOSIS — Z9181 History of falling: Secondary | ICD-10-CM | POA: Diagnosis not present

## 2020-07-27 DIAGNOSIS — I509 Heart failure, unspecified: Secondary | ICD-10-CM | POA: Diagnosis not present

## 2020-07-27 DIAGNOSIS — R2681 Unsteadiness on feet: Secondary | ICD-10-CM | POA: Diagnosis not present

## 2020-07-27 DIAGNOSIS — G8929 Other chronic pain: Secondary | ICD-10-CM | POA: Diagnosis not present

## 2020-07-27 DIAGNOSIS — I10 Essential (primary) hypertension: Secondary | ICD-10-CM | POA: Diagnosis not present

## 2020-07-27 DIAGNOSIS — F32A Depression, unspecified: Secondary | ICD-10-CM | POA: Diagnosis not present

## 2020-07-27 DIAGNOSIS — N182 Chronic kidney disease, stage 2 (mild): Secondary | ICD-10-CM | POA: Diagnosis not present

## 2020-07-27 DIAGNOSIS — H04123 Dry eye syndrome of bilateral lacrimal glands: Secondary | ICD-10-CM | POA: Diagnosis not present

## 2020-07-27 DIAGNOSIS — N3000 Acute cystitis without hematuria: Secondary | ICD-10-CM | POA: Diagnosis not present

## 2020-07-28 DIAGNOSIS — Z9181 History of falling: Secondary | ICD-10-CM | POA: Diagnosis not present

## 2020-07-28 DIAGNOSIS — G8929 Other chronic pain: Secondary | ICD-10-CM | POA: Diagnosis not present

## 2020-07-28 DIAGNOSIS — I509 Heart failure, unspecified: Secondary | ICD-10-CM | POA: Diagnosis not present

## 2020-07-28 DIAGNOSIS — Z741 Need for assistance with personal care: Secondary | ICD-10-CM | POA: Diagnosis not present

## 2020-07-28 DIAGNOSIS — M6281 Muscle weakness (generalized): Secondary | ICD-10-CM | POA: Diagnosis not present

## 2020-07-28 DIAGNOSIS — N182 Chronic kidney disease, stage 2 (mild): Secondary | ICD-10-CM | POA: Diagnosis not present

## 2020-07-28 DIAGNOSIS — I10 Essential (primary) hypertension: Secondary | ICD-10-CM | POA: Diagnosis not present

## 2020-07-28 DIAGNOSIS — E119 Type 2 diabetes mellitus without complications: Secondary | ICD-10-CM | POA: Diagnosis not present

## 2020-07-28 DIAGNOSIS — R55 Syncope and collapse: Secondary | ICD-10-CM | POA: Diagnosis not present

## 2020-07-28 DIAGNOSIS — R2681 Unsteadiness on feet: Secondary | ICD-10-CM | POA: Diagnosis not present

## 2020-07-28 DIAGNOSIS — N3000 Acute cystitis without hematuria: Secondary | ICD-10-CM | POA: Diagnosis not present

## 2020-07-31 DIAGNOSIS — R2681 Unsteadiness on feet: Secondary | ICD-10-CM | POA: Diagnosis not present

## 2020-07-31 DIAGNOSIS — N329 Bladder disorder, unspecified: Secondary | ICD-10-CM | POA: Diagnosis not present

## 2020-07-31 DIAGNOSIS — I10 Essential (primary) hypertension: Secondary | ICD-10-CM | POA: Diagnosis not present

## 2020-07-31 DIAGNOSIS — M6281 Muscle weakness (generalized): Secondary | ICD-10-CM | POA: Diagnosis not present

## 2020-07-31 DIAGNOSIS — Z741 Need for assistance with personal care: Secondary | ICD-10-CM | POA: Diagnosis not present

## 2020-07-31 DIAGNOSIS — F32A Depression, unspecified: Secondary | ICD-10-CM | POA: Diagnosis not present

## 2020-07-31 DIAGNOSIS — Z9181 History of falling: Secondary | ICD-10-CM | POA: Diagnosis not present

## 2020-07-31 DIAGNOSIS — N3000 Acute cystitis without hematuria: Secondary | ICD-10-CM | POA: Diagnosis not present

## 2020-07-31 DIAGNOSIS — I5032 Chronic diastolic (congestive) heart failure: Secondary | ICD-10-CM | POA: Diagnosis not present

## 2020-07-31 DIAGNOSIS — M549 Dorsalgia, unspecified: Secondary | ICD-10-CM | POA: Diagnosis not present

## 2020-07-31 DIAGNOSIS — N182 Chronic kidney disease, stage 2 (mild): Secondary | ICD-10-CM | POA: Diagnosis not present

## 2020-07-31 DIAGNOSIS — G629 Polyneuropathy, unspecified: Secondary | ICD-10-CM | POA: Diagnosis not present

## 2020-07-31 DIAGNOSIS — G8929 Other chronic pain: Secondary | ICD-10-CM | POA: Diagnosis not present

## 2020-07-31 DIAGNOSIS — E119 Type 2 diabetes mellitus without complications: Secondary | ICD-10-CM | POA: Diagnosis not present

## 2020-07-31 DIAGNOSIS — R55 Syncope and collapse: Secondary | ICD-10-CM | POA: Diagnosis not present

## 2020-07-31 DIAGNOSIS — I509 Heart failure, unspecified: Secondary | ICD-10-CM | POA: Diagnosis not present

## 2020-08-01 DIAGNOSIS — N182 Chronic kidney disease, stage 2 (mild): Secondary | ICD-10-CM | POA: Diagnosis not present

## 2020-08-01 DIAGNOSIS — Z9181 History of falling: Secondary | ICD-10-CM | POA: Diagnosis not present

## 2020-08-01 DIAGNOSIS — Z741 Need for assistance with personal care: Secondary | ICD-10-CM | POA: Diagnosis not present

## 2020-08-01 DIAGNOSIS — N3000 Acute cystitis without hematuria: Secondary | ICD-10-CM | POA: Diagnosis not present

## 2020-08-01 DIAGNOSIS — I509 Heart failure, unspecified: Secondary | ICD-10-CM | POA: Diagnosis not present

## 2020-08-01 DIAGNOSIS — I10 Essential (primary) hypertension: Secondary | ICD-10-CM | POA: Diagnosis not present

## 2020-08-01 DIAGNOSIS — R2681 Unsteadiness on feet: Secondary | ICD-10-CM | POA: Diagnosis not present

## 2020-08-01 DIAGNOSIS — E119 Type 2 diabetes mellitus without complications: Secondary | ICD-10-CM | POA: Diagnosis not present

## 2020-08-01 DIAGNOSIS — M6281 Muscle weakness (generalized): Secondary | ICD-10-CM | POA: Diagnosis not present

## 2020-08-01 DIAGNOSIS — R55 Syncope and collapse: Secondary | ICD-10-CM | POA: Diagnosis not present

## 2020-08-01 DIAGNOSIS — G8929 Other chronic pain: Secondary | ICD-10-CM | POA: Diagnosis not present

## 2020-08-02 DIAGNOSIS — R2681 Unsteadiness on feet: Secondary | ICD-10-CM | POA: Diagnosis not present

## 2020-08-02 DIAGNOSIS — Z741 Need for assistance with personal care: Secondary | ICD-10-CM | POA: Diagnosis not present

## 2020-08-02 DIAGNOSIS — N3000 Acute cystitis without hematuria: Secondary | ICD-10-CM | POA: Diagnosis not present

## 2020-08-02 DIAGNOSIS — G8929 Other chronic pain: Secondary | ICD-10-CM | POA: Diagnosis not present

## 2020-08-02 DIAGNOSIS — I10 Essential (primary) hypertension: Secondary | ICD-10-CM | POA: Diagnosis not present

## 2020-08-02 DIAGNOSIS — N182 Chronic kidney disease, stage 2 (mild): Secondary | ICD-10-CM | POA: Diagnosis not present

## 2020-08-02 DIAGNOSIS — Z9181 History of falling: Secondary | ICD-10-CM | POA: Diagnosis not present

## 2020-08-02 DIAGNOSIS — R55 Syncope and collapse: Secondary | ICD-10-CM | POA: Diagnosis not present

## 2020-08-02 DIAGNOSIS — M6281 Muscle weakness (generalized): Secondary | ICD-10-CM | POA: Diagnosis not present

## 2020-08-02 DIAGNOSIS — E119 Type 2 diabetes mellitus without complications: Secondary | ICD-10-CM | POA: Diagnosis not present

## 2020-08-02 DIAGNOSIS — I509 Heart failure, unspecified: Secondary | ICD-10-CM | POA: Diagnosis not present

## 2020-08-03 DIAGNOSIS — N3000 Acute cystitis without hematuria: Secondary | ICD-10-CM | POA: Diagnosis not present

## 2020-08-03 DIAGNOSIS — I10 Essential (primary) hypertension: Secondary | ICD-10-CM | POA: Diagnosis not present

## 2020-08-03 DIAGNOSIS — Z741 Need for assistance with personal care: Secondary | ICD-10-CM | POA: Diagnosis not present

## 2020-08-03 DIAGNOSIS — Z9181 History of falling: Secondary | ICD-10-CM | POA: Diagnosis not present

## 2020-08-03 DIAGNOSIS — R55 Syncope and collapse: Secondary | ICD-10-CM | POA: Diagnosis not present

## 2020-08-03 DIAGNOSIS — R2681 Unsteadiness on feet: Secondary | ICD-10-CM | POA: Diagnosis not present

## 2020-08-03 DIAGNOSIS — N182 Chronic kidney disease, stage 2 (mild): Secondary | ICD-10-CM | POA: Diagnosis not present

## 2020-08-03 DIAGNOSIS — M6281 Muscle weakness (generalized): Secondary | ICD-10-CM | POA: Diagnosis not present

## 2020-08-03 DIAGNOSIS — I509 Heart failure, unspecified: Secondary | ICD-10-CM | POA: Diagnosis not present

## 2020-08-03 DIAGNOSIS — G8929 Other chronic pain: Secondary | ICD-10-CM | POA: Diagnosis not present

## 2020-08-03 DIAGNOSIS — E119 Type 2 diabetes mellitus without complications: Secondary | ICD-10-CM | POA: Diagnosis not present

## 2020-08-05 DIAGNOSIS — N3000 Acute cystitis without hematuria: Secondary | ICD-10-CM | POA: Diagnosis not present

## 2020-08-05 DIAGNOSIS — N182 Chronic kidney disease, stage 2 (mild): Secondary | ICD-10-CM | POA: Diagnosis not present

## 2020-08-05 DIAGNOSIS — Z9181 History of falling: Secondary | ICD-10-CM | POA: Diagnosis not present

## 2020-08-05 DIAGNOSIS — Z741 Need for assistance with personal care: Secondary | ICD-10-CM | POA: Diagnosis not present

## 2020-08-05 DIAGNOSIS — E119 Type 2 diabetes mellitus without complications: Secondary | ICD-10-CM | POA: Diagnosis not present

## 2020-08-05 DIAGNOSIS — R2681 Unsteadiness on feet: Secondary | ICD-10-CM | POA: Diagnosis not present

## 2020-08-05 DIAGNOSIS — I10 Essential (primary) hypertension: Secondary | ICD-10-CM | POA: Diagnosis not present

## 2020-08-05 DIAGNOSIS — R55 Syncope and collapse: Secondary | ICD-10-CM | POA: Diagnosis not present

## 2020-08-05 DIAGNOSIS — G8929 Other chronic pain: Secondary | ICD-10-CM | POA: Diagnosis not present

## 2020-08-05 DIAGNOSIS — I509 Heart failure, unspecified: Secondary | ICD-10-CM | POA: Diagnosis not present

## 2020-08-05 DIAGNOSIS — M6281 Muscle weakness (generalized): Secondary | ICD-10-CM | POA: Diagnosis not present

## 2020-08-07 DIAGNOSIS — N3000 Acute cystitis without hematuria: Secondary | ICD-10-CM | POA: Diagnosis not present

## 2020-08-07 DIAGNOSIS — G8929 Other chronic pain: Secondary | ICD-10-CM | POA: Diagnosis not present

## 2020-08-07 DIAGNOSIS — I509 Heart failure, unspecified: Secondary | ICD-10-CM | POA: Diagnosis not present

## 2020-08-07 DIAGNOSIS — R55 Syncope and collapse: Secondary | ICD-10-CM | POA: Diagnosis not present

## 2020-08-07 DIAGNOSIS — Z9181 History of falling: Secondary | ICD-10-CM | POA: Diagnosis not present

## 2020-08-07 DIAGNOSIS — Z741 Need for assistance with personal care: Secondary | ICD-10-CM | POA: Diagnosis not present

## 2020-08-07 DIAGNOSIS — E119 Type 2 diabetes mellitus without complications: Secondary | ICD-10-CM | POA: Diagnosis not present

## 2020-08-07 DIAGNOSIS — R2681 Unsteadiness on feet: Secondary | ICD-10-CM | POA: Diagnosis not present

## 2020-08-07 DIAGNOSIS — I10 Essential (primary) hypertension: Secondary | ICD-10-CM | POA: Diagnosis not present

## 2020-08-07 DIAGNOSIS — N182 Chronic kidney disease, stage 2 (mild): Secondary | ICD-10-CM | POA: Diagnosis not present

## 2020-08-07 DIAGNOSIS — M6281 Muscle weakness (generalized): Secondary | ICD-10-CM | POA: Diagnosis not present

## 2020-08-08 DIAGNOSIS — Z9181 History of falling: Secondary | ICD-10-CM | POA: Diagnosis not present

## 2020-08-08 DIAGNOSIS — I10 Essential (primary) hypertension: Secondary | ICD-10-CM | POA: Diagnosis not present

## 2020-08-08 DIAGNOSIS — E119 Type 2 diabetes mellitus without complications: Secondary | ICD-10-CM | POA: Diagnosis not present

## 2020-08-08 DIAGNOSIS — M6281 Muscle weakness (generalized): Secondary | ICD-10-CM | POA: Diagnosis not present

## 2020-08-08 DIAGNOSIS — R2681 Unsteadiness on feet: Secondary | ICD-10-CM | POA: Diagnosis not present

## 2020-08-08 DIAGNOSIS — N3000 Acute cystitis without hematuria: Secondary | ICD-10-CM | POA: Diagnosis not present

## 2020-08-08 DIAGNOSIS — G8929 Other chronic pain: Secondary | ICD-10-CM | POA: Diagnosis not present

## 2020-08-08 DIAGNOSIS — R55 Syncope and collapse: Secondary | ICD-10-CM | POA: Diagnosis not present

## 2020-08-08 DIAGNOSIS — Z741 Need for assistance with personal care: Secondary | ICD-10-CM | POA: Diagnosis not present

## 2020-08-08 DIAGNOSIS — I509 Heart failure, unspecified: Secondary | ICD-10-CM | POA: Diagnosis not present

## 2020-08-08 DIAGNOSIS — N182 Chronic kidney disease, stage 2 (mild): Secondary | ICD-10-CM | POA: Diagnosis not present

## 2020-08-09 DIAGNOSIS — N182 Chronic kidney disease, stage 2 (mild): Secondary | ICD-10-CM | POA: Diagnosis not present

## 2020-08-09 DIAGNOSIS — M6281 Muscle weakness (generalized): Secondary | ICD-10-CM | POA: Diagnosis not present

## 2020-08-09 DIAGNOSIS — I10 Essential (primary) hypertension: Secondary | ICD-10-CM | POA: Diagnosis not present

## 2020-08-09 DIAGNOSIS — I5032 Chronic diastolic (congestive) heart failure: Secondary | ICD-10-CM | POA: Diagnosis not present

## 2020-08-09 DIAGNOSIS — I509 Heart failure, unspecified: Secondary | ICD-10-CM | POA: Diagnosis not present

## 2020-08-09 DIAGNOSIS — R2681 Unsteadiness on feet: Secondary | ICD-10-CM | POA: Diagnosis not present

## 2020-08-09 DIAGNOSIS — G629 Polyneuropathy, unspecified: Secondary | ICD-10-CM | POA: Diagnosis not present

## 2020-08-09 DIAGNOSIS — E119 Type 2 diabetes mellitus without complications: Secondary | ICD-10-CM | POA: Diagnosis not present

## 2020-08-09 DIAGNOSIS — E785 Hyperlipidemia, unspecified: Secondary | ICD-10-CM | POA: Diagnosis not present

## 2020-08-09 DIAGNOSIS — R55 Syncope and collapse: Secondary | ICD-10-CM | POA: Diagnosis not present

## 2020-08-09 DIAGNOSIS — Z9181 History of falling: Secondary | ICD-10-CM | POA: Diagnosis not present

## 2020-08-09 DIAGNOSIS — M549 Dorsalgia, unspecified: Secondary | ICD-10-CM | POA: Diagnosis not present

## 2020-08-09 DIAGNOSIS — F32A Depression, unspecified: Secondary | ICD-10-CM | POA: Diagnosis not present

## 2020-08-09 DIAGNOSIS — N3000 Acute cystitis without hematuria: Secondary | ICD-10-CM | POA: Diagnosis not present

## 2020-08-09 DIAGNOSIS — G8929 Other chronic pain: Secondary | ICD-10-CM | POA: Diagnosis not present

## 2020-08-09 DIAGNOSIS — Z741 Need for assistance with personal care: Secondary | ICD-10-CM | POA: Diagnosis not present

## 2020-08-10 DIAGNOSIS — G8929 Other chronic pain: Secondary | ICD-10-CM | POA: Diagnosis not present

## 2020-08-10 DIAGNOSIS — I10 Essential (primary) hypertension: Secondary | ICD-10-CM | POA: Diagnosis not present

## 2020-08-10 DIAGNOSIS — R55 Syncope and collapse: Secondary | ICD-10-CM | POA: Diagnosis not present

## 2020-08-10 DIAGNOSIS — N182 Chronic kidney disease, stage 2 (mild): Secondary | ICD-10-CM | POA: Diagnosis not present

## 2020-08-10 DIAGNOSIS — I509 Heart failure, unspecified: Secondary | ICD-10-CM | POA: Diagnosis not present

## 2020-08-10 DIAGNOSIS — R2681 Unsteadiness on feet: Secondary | ICD-10-CM | POA: Diagnosis not present

## 2020-08-10 DIAGNOSIS — Z9181 History of falling: Secondary | ICD-10-CM | POA: Diagnosis not present

## 2020-08-10 DIAGNOSIS — E119 Type 2 diabetes mellitus without complications: Secondary | ICD-10-CM | POA: Diagnosis not present

## 2020-08-10 DIAGNOSIS — Z741 Need for assistance with personal care: Secondary | ICD-10-CM | POA: Diagnosis not present

## 2020-08-10 DIAGNOSIS — M6281 Muscle weakness (generalized): Secondary | ICD-10-CM | POA: Diagnosis not present

## 2020-08-10 DIAGNOSIS — N3000 Acute cystitis without hematuria: Secondary | ICD-10-CM | POA: Diagnosis not present

## 2020-08-14 DIAGNOSIS — E119 Type 2 diabetes mellitus without complications: Secondary | ICD-10-CM | POA: Diagnosis not present

## 2020-08-14 DIAGNOSIS — N182 Chronic kidney disease, stage 2 (mild): Secondary | ICD-10-CM | POA: Diagnosis not present

## 2020-08-14 DIAGNOSIS — R55 Syncope and collapse: Secondary | ICD-10-CM | POA: Diagnosis not present

## 2020-08-14 DIAGNOSIS — G8929 Other chronic pain: Secondary | ICD-10-CM | POA: Diagnosis not present

## 2020-08-14 DIAGNOSIS — I10 Essential (primary) hypertension: Secondary | ICD-10-CM | POA: Diagnosis not present

## 2020-08-14 DIAGNOSIS — Z741 Need for assistance with personal care: Secondary | ICD-10-CM | POA: Diagnosis not present

## 2020-08-14 DIAGNOSIS — R2681 Unsteadiness on feet: Secondary | ICD-10-CM | POA: Diagnosis not present

## 2020-08-14 DIAGNOSIS — M6281 Muscle weakness (generalized): Secondary | ICD-10-CM | POA: Diagnosis not present

## 2020-08-14 DIAGNOSIS — Z9181 History of falling: Secondary | ICD-10-CM | POA: Diagnosis not present

## 2020-08-14 DIAGNOSIS — I509 Heart failure, unspecified: Secondary | ICD-10-CM | POA: Diagnosis not present

## 2020-08-14 DIAGNOSIS — N3000 Acute cystitis without hematuria: Secondary | ICD-10-CM | POA: Diagnosis not present

## 2020-08-15 DIAGNOSIS — M6281 Muscle weakness (generalized): Secondary | ICD-10-CM | POA: Diagnosis not present

## 2020-08-15 DIAGNOSIS — R55 Syncope and collapse: Secondary | ICD-10-CM | POA: Diagnosis not present

## 2020-08-15 DIAGNOSIS — E119 Type 2 diabetes mellitus without complications: Secondary | ICD-10-CM | POA: Diagnosis not present

## 2020-08-15 DIAGNOSIS — N182 Chronic kidney disease, stage 2 (mild): Secondary | ICD-10-CM | POA: Diagnosis not present

## 2020-08-15 DIAGNOSIS — Z741 Need for assistance with personal care: Secondary | ICD-10-CM | POA: Diagnosis not present

## 2020-08-15 DIAGNOSIS — R2681 Unsteadiness on feet: Secondary | ICD-10-CM | POA: Diagnosis not present

## 2020-08-15 DIAGNOSIS — Z9181 History of falling: Secondary | ICD-10-CM | POA: Diagnosis not present

## 2020-08-15 DIAGNOSIS — N3000 Acute cystitis without hematuria: Secondary | ICD-10-CM | POA: Diagnosis not present

## 2020-08-15 DIAGNOSIS — G8929 Other chronic pain: Secondary | ICD-10-CM | POA: Diagnosis not present

## 2020-08-15 DIAGNOSIS — I10 Essential (primary) hypertension: Secondary | ICD-10-CM | POA: Diagnosis not present

## 2020-08-15 DIAGNOSIS — I509 Heart failure, unspecified: Secondary | ICD-10-CM | POA: Diagnosis not present

## 2020-08-16 DIAGNOSIS — R55 Syncope and collapse: Secondary | ICD-10-CM | POA: Diagnosis not present

## 2020-08-16 DIAGNOSIS — R2681 Unsteadiness on feet: Secondary | ICD-10-CM | POA: Diagnosis not present

## 2020-08-16 DIAGNOSIS — I10 Essential (primary) hypertension: Secondary | ICD-10-CM | POA: Diagnosis not present

## 2020-08-16 DIAGNOSIS — Z741 Need for assistance with personal care: Secondary | ICD-10-CM | POA: Diagnosis not present

## 2020-08-16 DIAGNOSIS — N182 Chronic kidney disease, stage 2 (mild): Secondary | ICD-10-CM | POA: Diagnosis not present

## 2020-08-16 DIAGNOSIS — E119 Type 2 diabetes mellitus without complications: Secondary | ICD-10-CM | POA: Diagnosis not present

## 2020-08-16 DIAGNOSIS — I5032 Chronic diastolic (congestive) heart failure: Secondary | ICD-10-CM | POA: Diagnosis not present

## 2020-08-16 DIAGNOSIS — Z9181 History of falling: Secondary | ICD-10-CM | POA: Diagnosis not present

## 2020-08-16 DIAGNOSIS — M549 Dorsalgia, unspecified: Secondary | ICD-10-CM | POA: Diagnosis not present

## 2020-08-16 DIAGNOSIS — G629 Polyneuropathy, unspecified: Secondary | ICD-10-CM | POA: Diagnosis not present

## 2020-08-16 DIAGNOSIS — N3000 Acute cystitis without hematuria: Secondary | ICD-10-CM | POA: Diagnosis not present

## 2020-08-16 DIAGNOSIS — M6281 Muscle weakness (generalized): Secondary | ICD-10-CM | POA: Diagnosis not present

## 2020-08-16 DIAGNOSIS — I509 Heart failure, unspecified: Secondary | ICD-10-CM | POA: Diagnosis not present

## 2020-08-16 DIAGNOSIS — G8929 Other chronic pain: Secondary | ICD-10-CM | POA: Diagnosis not present

## 2020-08-16 DIAGNOSIS — F32A Depression, unspecified: Secondary | ICD-10-CM | POA: Diagnosis not present

## 2020-08-21 DIAGNOSIS — I10 Essential (primary) hypertension: Secondary | ICD-10-CM | POA: Diagnosis not present

## 2020-08-21 DIAGNOSIS — Z9181 History of falling: Secondary | ICD-10-CM | POA: Diagnosis not present

## 2020-08-21 DIAGNOSIS — E119 Type 2 diabetes mellitus without complications: Secondary | ICD-10-CM | POA: Diagnosis not present

## 2020-08-21 DIAGNOSIS — G8929 Other chronic pain: Secondary | ICD-10-CM | POA: Diagnosis not present

## 2020-08-21 DIAGNOSIS — I509 Heart failure, unspecified: Secondary | ICD-10-CM | POA: Diagnosis not present

## 2020-08-21 DIAGNOSIS — M6281 Muscle weakness (generalized): Secondary | ICD-10-CM | POA: Diagnosis not present

## 2020-08-21 DIAGNOSIS — R2681 Unsteadiness on feet: Secondary | ICD-10-CM | POA: Diagnosis not present

## 2020-08-21 DIAGNOSIS — N3000 Acute cystitis without hematuria: Secondary | ICD-10-CM | POA: Diagnosis not present

## 2020-08-21 DIAGNOSIS — N182 Chronic kidney disease, stage 2 (mild): Secondary | ICD-10-CM | POA: Diagnosis not present

## 2020-08-21 DIAGNOSIS — Z741 Need for assistance with personal care: Secondary | ICD-10-CM | POA: Diagnosis not present

## 2020-08-21 DIAGNOSIS — R55 Syncope and collapse: Secondary | ICD-10-CM | POA: Diagnosis not present

## 2020-08-22 DIAGNOSIS — R2681 Unsteadiness on feet: Secondary | ICD-10-CM | POA: Diagnosis not present

## 2020-08-22 DIAGNOSIS — N182 Chronic kidney disease, stage 2 (mild): Secondary | ICD-10-CM | POA: Diagnosis not present

## 2020-08-22 DIAGNOSIS — Z9181 History of falling: Secondary | ICD-10-CM | POA: Diagnosis not present

## 2020-08-22 DIAGNOSIS — E119 Type 2 diabetes mellitus without complications: Secondary | ICD-10-CM | POA: Diagnosis not present

## 2020-08-22 DIAGNOSIS — Z741 Need for assistance with personal care: Secondary | ICD-10-CM | POA: Diagnosis not present

## 2020-08-22 DIAGNOSIS — I10 Essential (primary) hypertension: Secondary | ICD-10-CM | POA: Diagnosis not present

## 2020-08-22 DIAGNOSIS — R55 Syncope and collapse: Secondary | ICD-10-CM | POA: Diagnosis not present

## 2020-08-22 DIAGNOSIS — N3000 Acute cystitis without hematuria: Secondary | ICD-10-CM | POA: Diagnosis not present

## 2020-08-22 DIAGNOSIS — G8929 Other chronic pain: Secondary | ICD-10-CM | POA: Diagnosis not present

## 2020-08-22 DIAGNOSIS — I509 Heart failure, unspecified: Secondary | ICD-10-CM | POA: Diagnosis not present

## 2020-08-22 DIAGNOSIS — M6281 Muscle weakness (generalized): Secondary | ICD-10-CM | POA: Diagnosis not present

## 2020-08-23 DIAGNOSIS — M6281 Muscle weakness (generalized): Secondary | ICD-10-CM | POA: Diagnosis not present

## 2020-08-23 DIAGNOSIS — N3000 Acute cystitis without hematuria: Secondary | ICD-10-CM | POA: Diagnosis not present

## 2020-08-23 DIAGNOSIS — N182 Chronic kidney disease, stage 2 (mild): Secondary | ICD-10-CM | POA: Diagnosis not present

## 2020-08-23 DIAGNOSIS — E119 Type 2 diabetes mellitus without complications: Secondary | ICD-10-CM | POA: Diagnosis not present

## 2020-08-23 DIAGNOSIS — Z9181 History of falling: Secondary | ICD-10-CM | POA: Diagnosis not present

## 2020-08-23 DIAGNOSIS — G8929 Other chronic pain: Secondary | ICD-10-CM | POA: Diagnosis not present

## 2020-08-23 DIAGNOSIS — I509 Heart failure, unspecified: Secondary | ICD-10-CM | POA: Diagnosis not present

## 2020-08-23 DIAGNOSIS — I10 Essential (primary) hypertension: Secondary | ICD-10-CM | POA: Diagnosis not present

## 2020-08-23 DIAGNOSIS — R55 Syncope and collapse: Secondary | ICD-10-CM | POA: Diagnosis not present

## 2020-08-23 DIAGNOSIS — Z741 Need for assistance with personal care: Secondary | ICD-10-CM | POA: Diagnosis not present

## 2020-08-23 DIAGNOSIS — R2681 Unsteadiness on feet: Secondary | ICD-10-CM | POA: Diagnosis not present

## 2020-08-24 DIAGNOSIS — Z9181 History of falling: Secondary | ICD-10-CM | POA: Diagnosis not present

## 2020-08-24 DIAGNOSIS — M6281 Muscle weakness (generalized): Secondary | ICD-10-CM | POA: Diagnosis not present

## 2020-08-24 DIAGNOSIS — E119 Type 2 diabetes mellitus without complications: Secondary | ICD-10-CM | POA: Diagnosis not present

## 2020-08-24 DIAGNOSIS — N3000 Acute cystitis without hematuria: Secondary | ICD-10-CM | POA: Diagnosis not present

## 2020-08-24 DIAGNOSIS — R55 Syncope and collapse: Secondary | ICD-10-CM | POA: Diagnosis not present

## 2020-08-24 DIAGNOSIS — G8929 Other chronic pain: Secondary | ICD-10-CM | POA: Diagnosis not present

## 2020-08-24 DIAGNOSIS — I10 Essential (primary) hypertension: Secondary | ICD-10-CM | POA: Diagnosis not present

## 2020-08-24 DIAGNOSIS — I509 Heart failure, unspecified: Secondary | ICD-10-CM | POA: Diagnosis not present

## 2020-08-24 DIAGNOSIS — N182 Chronic kidney disease, stage 2 (mild): Secondary | ICD-10-CM | POA: Diagnosis not present

## 2020-08-24 DIAGNOSIS — R2681 Unsteadiness on feet: Secondary | ICD-10-CM | POA: Diagnosis not present

## 2020-08-24 DIAGNOSIS — Z741 Need for assistance with personal care: Secondary | ICD-10-CM | POA: Diagnosis not present

## 2020-08-25 DIAGNOSIS — F32A Depression, unspecified: Secondary | ICD-10-CM | POA: Diagnosis not present

## 2020-08-25 DIAGNOSIS — R609 Edema, unspecified: Secondary | ICD-10-CM | POA: Diagnosis not present

## 2020-08-25 DIAGNOSIS — M6281 Muscle weakness (generalized): Secondary | ICD-10-CM | POA: Diagnosis not present

## 2020-08-25 DIAGNOSIS — Z9181 History of falling: Secondary | ICD-10-CM | POA: Diagnosis not present

## 2020-08-25 DIAGNOSIS — R2681 Unsteadiness on feet: Secondary | ICD-10-CM | POA: Diagnosis not present

## 2020-08-25 DIAGNOSIS — I5032 Chronic diastolic (congestive) heart failure: Secondary | ICD-10-CM | POA: Diagnosis not present

## 2020-08-25 DIAGNOSIS — E119 Type 2 diabetes mellitus without complications: Secondary | ICD-10-CM | POA: Diagnosis not present

## 2020-08-25 DIAGNOSIS — G629 Polyneuropathy, unspecified: Secondary | ICD-10-CM | POA: Diagnosis not present

## 2020-08-25 DIAGNOSIS — N182 Chronic kidney disease, stage 2 (mild): Secondary | ICD-10-CM | POA: Diagnosis not present

## 2020-08-25 DIAGNOSIS — R55 Syncope and collapse: Secondary | ICD-10-CM | POA: Diagnosis not present

## 2020-08-25 DIAGNOSIS — G8929 Other chronic pain: Secondary | ICD-10-CM | POA: Diagnosis not present

## 2020-08-25 DIAGNOSIS — I509 Heart failure, unspecified: Secondary | ICD-10-CM | POA: Diagnosis not present

## 2020-08-25 DIAGNOSIS — N3000 Acute cystitis without hematuria: Secondary | ICD-10-CM | POA: Diagnosis not present

## 2020-08-25 DIAGNOSIS — I10 Essential (primary) hypertension: Secondary | ICD-10-CM | POA: Diagnosis not present

## 2020-08-25 DIAGNOSIS — Z741 Need for assistance with personal care: Secondary | ICD-10-CM | POA: Diagnosis not present

## 2020-08-31 DIAGNOSIS — G629 Polyneuropathy, unspecified: Secondary | ICD-10-CM | POA: Diagnosis not present

## 2020-08-31 DIAGNOSIS — R609 Edema, unspecified: Secondary | ICD-10-CM | POA: Diagnosis not present

## 2020-08-31 DIAGNOSIS — I5032 Chronic diastolic (congestive) heart failure: Secondary | ICD-10-CM | POA: Diagnosis not present

## 2020-08-31 DIAGNOSIS — F32A Depression, unspecified: Secondary | ICD-10-CM | POA: Diagnosis not present

## 2020-09-04 DIAGNOSIS — E559 Vitamin D deficiency, unspecified: Secondary | ICD-10-CM | POA: Diagnosis not present

## 2020-09-04 DIAGNOSIS — E089 Diabetes mellitus due to underlying condition without complications: Secondary | ICD-10-CM | POA: Diagnosis not present

## 2020-09-05 DIAGNOSIS — R609 Edema, unspecified: Secondary | ICD-10-CM | POA: Diagnosis not present

## 2020-09-05 DIAGNOSIS — I5032 Chronic diastolic (congestive) heart failure: Secondary | ICD-10-CM | POA: Diagnosis not present

## 2020-09-05 DIAGNOSIS — I10 Essential (primary) hypertension: Secondary | ICD-10-CM | POA: Diagnosis not present

## 2020-09-05 DIAGNOSIS — G629 Polyneuropathy, unspecified: Secondary | ICD-10-CM | POA: Diagnosis not present

## 2020-09-05 DIAGNOSIS — F32A Depression, unspecified: Secondary | ICD-10-CM | POA: Diagnosis not present

## 2020-09-19 DIAGNOSIS — F32A Depression, unspecified: Secondary | ICD-10-CM | POA: Diagnosis not present

## 2020-09-19 DIAGNOSIS — I5032 Chronic diastolic (congestive) heart failure: Secondary | ICD-10-CM | POA: Diagnosis not present

## 2020-09-19 DIAGNOSIS — R609 Edema, unspecified: Secondary | ICD-10-CM | POA: Diagnosis not present

## 2020-09-19 DIAGNOSIS — I10 Essential (primary) hypertension: Secondary | ICD-10-CM | POA: Diagnosis not present

## 2020-09-20 DIAGNOSIS — E559 Vitamin D deficiency, unspecified: Secondary | ICD-10-CM | POA: Diagnosis not present

## 2020-09-20 DIAGNOSIS — E089 Diabetes mellitus due to underlying condition without complications: Secondary | ICD-10-CM | POA: Diagnosis not present

## 2020-09-21 DIAGNOSIS — M5416 Radiculopathy, lumbar region: Secondary | ICD-10-CM | POA: Diagnosis not present

## 2020-09-21 DIAGNOSIS — I10 Essential (primary) hypertension: Secondary | ICD-10-CM | POA: Diagnosis not present

## 2020-09-21 DIAGNOSIS — M4316 Spondylolisthesis, lumbar region: Secondary | ICD-10-CM | POA: Diagnosis not present

## 2020-09-25 DIAGNOSIS — I10 Essential (primary) hypertension: Secondary | ICD-10-CM | POA: Diagnosis not present

## 2020-09-26 DIAGNOSIS — F32A Depression, unspecified: Secondary | ICD-10-CM | POA: Diagnosis not present

## 2020-09-26 DIAGNOSIS — I10 Essential (primary) hypertension: Secondary | ICD-10-CM | POA: Diagnosis not present

## 2020-09-26 DIAGNOSIS — I5032 Chronic diastolic (congestive) heart failure: Secondary | ICD-10-CM | POA: Diagnosis not present

## 2020-09-26 DIAGNOSIS — G629 Polyneuropathy, unspecified: Secondary | ICD-10-CM | POA: Diagnosis not present

## 2020-09-28 DIAGNOSIS — G629 Polyneuropathy, unspecified: Secondary | ICD-10-CM | POA: Diagnosis not present

## 2020-09-28 DIAGNOSIS — F32A Depression, unspecified: Secondary | ICD-10-CM | POA: Diagnosis not present

## 2020-09-28 DIAGNOSIS — H6122 Impacted cerumen, left ear: Secondary | ICD-10-CM | POA: Diagnosis not present

## 2020-09-28 DIAGNOSIS — I5032 Chronic diastolic (congestive) heart failure: Secondary | ICD-10-CM | POA: Diagnosis not present

## 2020-10-10 DIAGNOSIS — H6122 Impacted cerumen, left ear: Secondary | ICD-10-CM | POA: Diagnosis not present

## 2020-10-10 DIAGNOSIS — I5032 Chronic diastolic (congestive) heart failure: Secondary | ICD-10-CM | POA: Diagnosis not present

## 2020-10-10 DIAGNOSIS — F32A Depression, unspecified: Secondary | ICD-10-CM | POA: Diagnosis not present

## 2020-10-10 DIAGNOSIS — I10 Essential (primary) hypertension: Secondary | ICD-10-CM | POA: Diagnosis not present

## 2020-10-10 DIAGNOSIS — G629 Polyneuropathy, unspecified: Secondary | ICD-10-CM | POA: Diagnosis not present

## 2020-10-17 DIAGNOSIS — R609 Edema, unspecified: Secondary | ICD-10-CM | POA: Diagnosis not present

## 2020-10-17 DIAGNOSIS — M549 Dorsalgia, unspecified: Secondary | ICD-10-CM | POA: Diagnosis not present

## 2020-10-17 DIAGNOSIS — F32A Depression, unspecified: Secondary | ICD-10-CM | POA: Diagnosis not present

## 2020-10-17 DIAGNOSIS — G629 Polyneuropathy, unspecified: Secondary | ICD-10-CM | POA: Diagnosis not present

## 2020-10-17 DIAGNOSIS — I5032 Chronic diastolic (congestive) heart failure: Secondary | ICD-10-CM | POA: Diagnosis not present

## 2020-10-17 DIAGNOSIS — N329 Bladder disorder, unspecified: Secondary | ICD-10-CM | POA: Diagnosis not present

## 2020-10-23 DIAGNOSIS — F32A Depression, unspecified: Secondary | ICD-10-CM | POA: Diagnosis not present

## 2020-10-23 DIAGNOSIS — B372 Candidiasis of skin and nail: Secondary | ICD-10-CM | POA: Diagnosis not present

## 2020-10-23 DIAGNOSIS — I5032 Chronic diastolic (congestive) heart failure: Secondary | ICD-10-CM | POA: Diagnosis not present

## 2020-10-23 DIAGNOSIS — G629 Polyneuropathy, unspecified: Secondary | ICD-10-CM | POA: Diagnosis not present

## 2020-10-23 DIAGNOSIS — M549 Dorsalgia, unspecified: Secondary | ICD-10-CM | POA: Diagnosis not present

## 2020-11-02 DIAGNOSIS — R55 Syncope and collapse: Secondary | ICD-10-CM | POA: Diagnosis not present

## 2020-11-02 DIAGNOSIS — Z741 Need for assistance with personal care: Secondary | ICD-10-CM | POA: Diagnosis not present

## 2020-11-02 DIAGNOSIS — G8929 Other chronic pain: Secondary | ICD-10-CM | POA: Diagnosis not present

## 2020-11-02 DIAGNOSIS — Z9181 History of falling: Secondary | ICD-10-CM | POA: Diagnosis not present

## 2020-11-02 DIAGNOSIS — R2681 Unsteadiness on feet: Secondary | ICD-10-CM | POA: Diagnosis not present

## 2020-11-02 DIAGNOSIS — E119 Type 2 diabetes mellitus without complications: Secondary | ICD-10-CM | POA: Diagnosis not present

## 2020-11-02 DIAGNOSIS — M6281 Muscle weakness (generalized): Secondary | ICD-10-CM | POA: Diagnosis not present

## 2020-11-02 DIAGNOSIS — N182 Chronic kidney disease, stage 2 (mild): Secondary | ICD-10-CM | POA: Diagnosis not present

## 2020-11-02 DIAGNOSIS — N3000 Acute cystitis without hematuria: Secondary | ICD-10-CM | POA: Diagnosis not present

## 2020-11-02 DIAGNOSIS — I509 Heart failure, unspecified: Secondary | ICD-10-CM | POA: Diagnosis not present

## 2020-11-02 DIAGNOSIS — I10 Essential (primary) hypertension: Secondary | ICD-10-CM | POA: Diagnosis not present

## 2020-11-03 DIAGNOSIS — G629 Polyneuropathy, unspecified: Secondary | ICD-10-CM | POA: Diagnosis not present

## 2020-11-03 DIAGNOSIS — I10 Essential (primary) hypertension: Secondary | ICD-10-CM | POA: Diagnosis not present

## 2020-11-03 DIAGNOSIS — I5032 Chronic diastolic (congestive) heart failure: Secondary | ICD-10-CM | POA: Diagnosis not present

## 2020-11-03 DIAGNOSIS — F32A Depression, unspecified: Secondary | ICD-10-CM | POA: Diagnosis not present

## 2020-11-06 DIAGNOSIS — I509 Heart failure, unspecified: Secondary | ICD-10-CM | POA: Diagnosis not present

## 2020-11-06 DIAGNOSIS — Z741 Need for assistance with personal care: Secondary | ICD-10-CM | POA: Diagnosis not present

## 2020-11-06 DIAGNOSIS — G8929 Other chronic pain: Secondary | ICD-10-CM | POA: Diagnosis not present

## 2020-11-06 DIAGNOSIS — M6281 Muscle weakness (generalized): Secondary | ICD-10-CM | POA: Diagnosis not present

## 2020-11-06 DIAGNOSIS — N182 Chronic kidney disease, stage 2 (mild): Secondary | ICD-10-CM | POA: Diagnosis not present

## 2020-11-06 DIAGNOSIS — N3000 Acute cystitis without hematuria: Secondary | ICD-10-CM | POA: Diagnosis not present

## 2020-11-06 DIAGNOSIS — Z9181 History of falling: Secondary | ICD-10-CM | POA: Diagnosis not present

## 2020-11-06 DIAGNOSIS — R2681 Unsteadiness on feet: Secondary | ICD-10-CM | POA: Diagnosis not present

## 2020-11-06 DIAGNOSIS — I10 Essential (primary) hypertension: Secondary | ICD-10-CM | POA: Diagnosis not present

## 2020-11-06 DIAGNOSIS — R55 Syncope and collapse: Secondary | ICD-10-CM | POA: Diagnosis not present

## 2020-11-06 DIAGNOSIS — E119 Type 2 diabetes mellitus without complications: Secondary | ICD-10-CM | POA: Diagnosis not present

## 2020-11-09 ENCOUNTER — Emergency Department (HOSPITAL_COMMUNITY): Payer: Medicare Other

## 2020-11-09 ENCOUNTER — Emergency Department (HOSPITAL_COMMUNITY)
Admission: EM | Admit: 2020-11-09 | Discharge: 2020-11-10 | Disposition: A | Discharge status: Home / Self Care | Payer: Medicare Other | Attending: Emergency Medicine | Admitting: Emergency Medicine

## 2020-11-09 ENCOUNTER — Encounter (HOSPITAL_COMMUNITY): Payer: Self-pay | Admitting: Emergency Medicine

## 2020-11-09 DIAGNOSIS — W19XXXA Unspecified fall, initial encounter: Secondary | ICD-10-CM

## 2020-11-09 DIAGNOSIS — M6281 Muscle weakness (generalized): Secondary | ICD-10-CM | POA: Diagnosis not present

## 2020-11-09 DIAGNOSIS — Z91040 Latex allergy status: Secondary | ICD-10-CM | POA: Diagnosis not present

## 2020-11-09 DIAGNOSIS — S5011XA Contusion of right forearm, initial encounter: Secondary | ICD-10-CM | POA: Diagnosis not present

## 2020-11-09 DIAGNOSIS — M25511 Pain in right shoulder: Secondary | ICD-10-CM | POA: Insufficient documentation

## 2020-11-09 DIAGNOSIS — I2510 Atherosclerotic heart disease of native coronary artery without angina pectoris: Secondary | ICD-10-CM | POA: Insufficient documentation

## 2020-11-09 DIAGNOSIS — I5032 Chronic diastolic (congestive) heart failure: Secondary | ICD-10-CM | POA: Diagnosis not present

## 2020-11-09 DIAGNOSIS — Z951 Presence of aortocoronary bypass graft: Secondary | ICD-10-CM | POA: Insufficient documentation

## 2020-11-09 DIAGNOSIS — Z23 Encounter for immunization: Secondary | ICD-10-CM | POA: Insufficient documentation

## 2020-11-09 DIAGNOSIS — Z7984 Long term (current) use of oral hypoglycemic drugs: Secondary | ICD-10-CM | POA: Insufficient documentation

## 2020-11-09 DIAGNOSIS — N182 Chronic kidney disease, stage 2 (mild): Secondary | ICD-10-CM | POA: Insufficient documentation

## 2020-11-09 DIAGNOSIS — S5001XA Contusion of right elbow, initial encounter: Secondary | ICD-10-CM | POA: Insufficient documentation

## 2020-11-09 DIAGNOSIS — R519 Headache, unspecified: Secondary | ICD-10-CM | POA: Insufficient documentation

## 2020-11-09 DIAGNOSIS — Z79899 Other long term (current) drug therapy: Secondary | ICD-10-CM | POA: Insufficient documentation

## 2020-11-09 DIAGNOSIS — Z7982 Long term (current) use of aspirin: Secondary | ICD-10-CM | POA: Insufficient documentation

## 2020-11-09 DIAGNOSIS — W01198A Fall on same level from slipping, tripping and stumbling with subsequent striking against other object, initial encounter: Secondary | ICD-10-CM | POA: Insufficient documentation

## 2020-11-09 DIAGNOSIS — I130 Hypertensive heart and chronic kidney disease with heart failure and stage 1 through stage 4 chronic kidney disease, or unspecified chronic kidney disease: Secondary | ICD-10-CM | POA: Diagnosis not present

## 2020-11-09 DIAGNOSIS — E1122 Type 2 diabetes mellitus with diabetic chronic kidney disease: Secondary | ICD-10-CM | POA: Diagnosis not present

## 2020-11-09 DIAGNOSIS — S0993XA Unspecified injury of face, initial encounter: Secondary | ICD-10-CM | POA: Diagnosis not present

## 2020-11-09 DIAGNOSIS — R6889 Other general symptoms and signs: Secondary | ICD-10-CM | POA: Diagnosis not present

## 2020-11-09 DIAGNOSIS — E119 Type 2 diabetes mellitus without complications: Secondary | ICD-10-CM | POA: Diagnosis not present

## 2020-11-09 DIAGNOSIS — R2681 Unsteadiness on feet: Secondary | ICD-10-CM | POA: Diagnosis not present

## 2020-11-09 DIAGNOSIS — G8929 Other chronic pain: Secondary | ICD-10-CM | POA: Diagnosis not present

## 2020-11-09 DIAGNOSIS — Z741 Need for assistance with personal care: Secondary | ICD-10-CM | POA: Diagnosis not present

## 2020-11-09 DIAGNOSIS — I10 Essential (primary) hypertension: Secondary | ICD-10-CM | POA: Diagnosis not present

## 2020-11-09 DIAGNOSIS — S01111A Laceration without foreign body of right eyelid and periocular area, initial encounter: Secondary | ICD-10-CM | POA: Insufficient documentation

## 2020-11-09 DIAGNOSIS — R0902 Hypoxemia: Secondary | ICD-10-CM | POA: Diagnosis not present

## 2020-11-09 DIAGNOSIS — Z9181 History of falling: Secondary | ICD-10-CM | POA: Diagnosis not present

## 2020-11-09 DIAGNOSIS — I509 Heart failure, unspecified: Secondary | ICD-10-CM | POA: Diagnosis not present

## 2020-11-09 DIAGNOSIS — N3000 Acute cystitis without hematuria: Secondary | ICD-10-CM | POA: Diagnosis not present

## 2020-11-09 DIAGNOSIS — S42121A Displaced fracture of acromial process, right shoulder, initial encounter for closed fracture: Secondary | ICD-10-CM

## 2020-11-09 DIAGNOSIS — S59901A Unspecified injury of right elbow, initial encounter: Secondary | ICD-10-CM | POA: Diagnosis present

## 2020-11-09 DIAGNOSIS — R55 Syncope and collapse: Secondary | ICD-10-CM | POA: Diagnosis not present

## 2020-11-09 DIAGNOSIS — Z743 Need for continuous supervision: Secondary | ICD-10-CM | POA: Diagnosis not present

## 2020-11-09 DIAGNOSIS — W19XXXD Unspecified fall, subsequent encounter: Secondary | ICD-10-CM | POA: Diagnosis not present

## 2020-11-09 LAB — URINALYSIS, ROUTINE W REFLEX MICROSCOPIC
Bilirubin Urine: NEGATIVE
Glucose, UA: NEGATIVE mg/dL
Hgb urine dipstick: NEGATIVE
Ketones, ur: NEGATIVE mg/dL
Leukocytes,Ua: NEGATIVE
Nitrite: NEGATIVE
Protein, ur: NEGATIVE mg/dL
Specific Gravity, Urine: 1.013 (ref 1.005–1.030)
pH: 7 (ref 5.0–8.0)

## 2020-11-09 LAB — BASIC METABOLIC PANEL
Anion gap: 4 — ABNORMAL LOW (ref 5–15)
BUN: 21 mg/dL (ref 8–23)
CO2: 32 mmol/L (ref 22–32)
Calcium: 9.5 mg/dL (ref 8.9–10.3)
Chloride: 104 mmol/L (ref 98–111)
Creatinine, Ser: 0.9 mg/dL (ref 0.44–1.00)
GFR, Estimated: >60 mL/min (ref >60)
Glucose, Bld: 127 mg/dL — ABNORMAL HIGH (ref 70–99)
Potassium: 5.5 mmol/L — ABNORMAL HIGH (ref 3.5–5.1)
Sodium: 140 mmol/L (ref 135–145)

## 2020-11-09 LAB — CBC WITH DIFFERENTIAL/PLATELET
Abs Immature Granulocytes: 0 10*3/uL (ref 0.00–0.07)
Basophils Absolute: 0.1 10*3/uL (ref 0.0–0.1)
Basophils Relative: 1 %
Eosinophils Absolute: 0.1 10*3/uL (ref 0.0–0.5)
Eosinophils Relative: 2 %
HCT: 38.1 % (ref 36.0–46.0)
Hemoglobin: 11.9 g/dL — ABNORMAL LOW (ref 12.0–15.0)
Lymphocytes Relative: 16 %
Lymphs Abs: 1 10*3/uL (ref 0.7–4.0)
MCH: 34.5 pg — ABNORMAL HIGH (ref 26.0–34.0)
MCHC: 31.2 g/dL (ref 30.0–36.0)
MCV: 110.4 fL — ABNORMAL HIGH (ref 80.0–100.0)
Monocytes Absolute: 0.4 10*3/uL (ref 0.1–1.0)
Monocytes Relative: 6 %
Neutro Abs: 4.5 10*3/uL (ref 1.7–7.7)
Neutrophils Relative %: 75 %
Platelets: 132 10*3/uL — ABNORMAL LOW (ref 150–400)
RBC: 3.45 MIL/uL — ABNORMAL LOW (ref 3.87–5.11)
RDW: 13.9 % (ref 11.5–15.5)
WBC: 6 10*3/uL (ref 4.0–10.5)
nRBC: 0 % (ref 0.0–0.2)
nRBC: 0 /100 WBC

## 2020-11-09 MED ORDER — TETANUS-DIPHTH-ACELL PERTUSSIS 5-2.5-18.5 LF-MCG/0.5 IM SUSY
0.5000 mL | PREFILLED_SYRINGE | Freq: Once | INTRAMUSCULAR | Status: AC
  Administered 2020-11-09: 0.5 mL via INTRAMUSCULAR
  Filled 2020-11-09: qty 0.5

## 2020-11-09 MED ORDER — ACETAMINOPHEN 500 MG PO TABS
1000.0000 mg | ORAL_TABLET | Freq: Once | ORAL | Status: DC
  Filled 2020-11-09: qty 2

## 2020-11-09 MED ORDER — CARVEDILOL 12.5 MG PO TABS: 12.5000 mg | ORAL_TABLET | Freq: Once | ORAL | Status: DC

## 2020-11-09 MED ORDER — HYDROCODONE-ACETAMINOPHEN 5-325 MG PO TABS
1.0000 | ORAL_TABLET | Freq: Once | ORAL | Status: AC
  Administered 2020-11-09: 1 via ORAL
  Filled 2020-11-09: qty 1

## 2020-11-09 MED ORDER — HYDROCODONE-ACETAMINOPHEN 5-325 MG PO TABS
2.0000 | ORAL_TABLET | Freq: Once | ORAL | Status: AC
  Administered 2020-11-09: 2 via ORAL
  Filled 2020-11-09: qty 2

## 2020-11-09 MED ORDER — SODIUM ZIRCONIUM CYCLOSILICATE 10 G PO PACK
10.0000 g | PACK | Freq: Once | ORAL | Status: AC
  Administered 2020-11-09: 10 g via ORAL
  Filled 2020-11-09: qty 1

## 2020-11-09 MED ORDER — BUSPIRONE HCL 15 MG PO TABS
7.5000 mg | ORAL_TABLET | Freq: Once | ORAL | Status: AC
  Administered 2020-11-10: 7.5 mg via ORAL
  Filled 2020-11-09: qty 1

## 2020-11-09 NOTE — ED Triage Notes (Signed)
Pt arrives via EMS from [place] with a mechanical fall today. Pt has bruising to right eye and laceration. Pt endorses pain across her whole chest.

## 2020-11-09 NOTE — ED Provider Notes (Signed)
[facility] EMERGENCY DEPARTMENT Provider Note   CSN: [id number] Arrival date & time: 11/09/20  1419     History Chief Complaint  Patient presents with   Andrea Kirk is a 80 y.o. female.  The history is provided by the patient and medical records. No language interpreter was used.  Fall   80 year old female significant history of diabetes, CAD, hypertension, CVA, diabetic neuropathy, chronic evaluation dysfunctions with a shuffling gait who brought here via EMS from a nursing facility for a fall.  Patient reports she fell this morning while carrying her breakfast tray.  States she tripped and fell landed on her right shoulder and elbow and did struck her face but denies any loss of consciousness.  She said it took a while to get up but she was able to get up with some help.  Patient denies any precipitating symptoms prior to the fall.  She denies any loss of consciousness no visual changes no nausea vomiting.  She endorsed some mild headache but most of complaint is to her right shoulder.  Shoulder pain is sharp throbbing waxing waning worsening with movement.  Pain is nonradiating.  No significant headache, neck pain, chest pain, trouble breathing, abdominal pain, hip pain or pain to her lower extremities.  No complaints of focal numbness or focal weakness.  No urinary symptoms.  On patient is currently on ASA and Brilinta.  She did endorse having bruising around her right eye and the staff at the facility did apply a Steri-Strip.  She denies any pain with eye movement or vision changes.   Past Medical History:  Diagnosis Date   Acute kidney injury ([place]) 08/05/2016   Aftercare following surgery of the circulatory system, NEC 05/11/2013   AKI (acute kidney injury) ([place]) 08/04/2016   Anxiety    takes Celexa daily   ARF (acute renal failure) ([place]) 11/07/2016   Asymptomatic stenosis of right carotid artery 03/22/2016   Back pain    occasionally   Carotid artery  disease ([place]) 04/16/2013   Carotid artery occlusion    Carotid stenosis 11/16/2013   Cataract    left and immature   Coronary artery disease    Coronary atherosclerosis of native coronary artery 03/11/2013   S/p CABG in 1997    Depression    Diabetes mellitus    takes Metformin and Glipizide daily   Diabetes mellitus ([place]) 05/02/2015   Dizziness    takes Meclizine daily as needed   Essential hypertension, benign 03/11/2013   GERD (gastroesophageal reflux disease)    takes Omeprazole daily as needed   Headache(784.0)    Hyperlipidemia    takes Atorvastatin daily   Hypertension    takes Carvedilol daily   Mixed hyperlipidemia 03/11/2013   Muscle spasm    takes Robaxin daily as needed   Nausea    takes Phenergan daily as needed   Occlusion and stenosis of carotid artery without mention of cerebral infarction 07/19/2011   Pneumonia    hx of-in high school   Restless leg    takes Requip daily as needed   Seasonal allergies    takes Claritin daily as needed and Afrin as needed   Shortness of breath    with exertion   Urinary urgency    UTI (urinary tract infection) 11/07/2016    Patient Active Problem List   Diagnosis Date Noted   Syncope, vasovagal 06/23/2020   Encephalopathy    Pulmonary hypertension ([place]) 04/20/2020  Controlled type 2 diabetes mellitus with hyperglycemia ([place]) 04/20/2020   Essential hypertension 04/20/2020   HLD (hyperlipidemia) 04/20/2020   Acute renal failure superimposed on stage 2 chronic kidney disease ([place]) 04/20/2020   Iron deficiency anemia 04/20/2020   Acute respiratory failure with hypoxia ([place]) 04/19/2020   Obese 04/19/2020   Chronic back pain 04/19/2020   Acute stroke due to ischemia [facility])    CVA (cerebral vascular accident) ([place]) 04/16/2020   Hyperkalemia 04/16/2020   Follow-up examination after eye surgery 04/22/2019   Macular retinoschisis, 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   Vitreomacular traction syndrome, 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   Depression 12/22/2017    Chronic pain 12/22/2017   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   Falls 12/22/2017   Chronic diastolic CHF (congestive heart failure) ([place]) 12/22/2017   Hypoxia 12/21/2017   ARF (acute renal failure) ([place]) 11/07/2016   Acute UTI 11/07/2016   Acute kidney injury ([place]) 08/05/2016   AKI (acute kidney injury) ([place]) 08/04/2016   Frequent falls 07/16/2016   Hypertension 07/16/2016   Asymptomatic stenosis of right carotid artery 03/22/2016   Diabetes mellitus ([place]) 05/02/2015   Carotid stenosis 11/16/2013   Aftercare following surgery of the circulatory system, NEC 05/11/2013   Carotid artery disease ([place]) 04/16/2013   Coronary artery disease 03/11/2013   Essential hypertension, benign 03/11/2013   Hyperlipidemia 03/11/2013   Occlusion and stenosis of carotid artery without mention of cerebral infarction 07/19/2011    Past Surgical History:  Procedure Laterality Date   COLONOSCOPY WITH PROPOFOL N/A 03/10/2012   Procedure: COLONOSCOPY WITH PROPOFOL;  Surgeon: Garlan Fair, MD;  Location: [facility];  Service: Endoscopy;  Laterality: N/A;   CORNEAL TRANSPLANT Right    CORONARY ARTERY BYPASS GRAFT  1997   x 6   CORONARY ARTERY BYPASS GRAFT  Jan. 1997   ENDARTERECTOMY Left 04/16/2013   Procedure: Left Carotid Artery Endatarectomy with Resection of Redundant Internal Carotid Artery;  Surgeon: Rosetta Posner, MD;  Location: [place];  Service: Vascular;  Laterality: Left;   ENDARTERECTOMY Right 03/22/2016   Procedure: RIGHT ENDARTERECTOMY CAROTID;  Surgeon: Rosetta Posner, MD;  Location[facility];  Service: Vascular;  Laterality: Right;   ESOPHAGOGASTRODUODENOSCOPY N/A 03/10/2012   Procedure: ESOPHAGOGASTRODUODENOSCOPY (EGD);  Surgeon: Garlan Fair, MD;  Location: [facility];  Service: Endoscopy;  Laterality: N/A;   EYE SURGERY  March 12, 2001   CORNEA TRANSPLANT Right eye   PATCH ANGIOPLASTY Right 03/22/2016   Procedure: PATCH ANGIOPLASTY;  Surgeon: Rosetta Posner, MD;  Location: [place];  Service: Vascular;  Laterality: Right;   [place] SURGERY  march 2013   Back surgery   TONSILLECTOMY     TRIGGER FINGER RELEASE Left    thumb     OB History   No obstetric history on file.     Family History  Problem Relation Age of Onset   Heart attack Father    Heart disease Father 73       Before age of 61   Hyperlipidemia Father    Heart disease Brother        Heart dissease before age 55   Hyperlipidemia Brother    Cirrhosis Mother    Heart attack Paternal Grandfather    Heart disease Paternal Grandfather    Cancer Maternal Grandmother    Alcoholism Maternal Grandfather    Heart attack Paternal Grandmother     Social History   Tobacco Use   Smoking status: Never   Smokeless tobacco:  Never  Vaping Use   Vaping Use: Never used  Substance Use Topics   Alcohol use: No    Alcohol/week: 0.0 standard drinks   Drug use: No    Home Medications Prior to Admission medications   Medication Sig Start Date End Date Taking? Authorizing Provider  acetaminophen (TYLENOL) 325 MG tablet Take 650 mg by mouth every 6 (six) hours as needed for moderate pain.    [provider]  Ascorbic Acid (VITAMIN C) 1000 MG tablet Take 1,000 mg by mouth daily.    [provider]  aspirin EC 81 MG tablet Take 81 mg by mouth every evening.     [provider]  carvedilol (COREG) 12.5 MG tablet Take 1 tablet (12.5 mg total) by mouth 2 (two) times daily. 06/28/20 09/26/20  Dessa Phi, DO  citalopram (CELEXA) 20 MG tablet Take 20 mg by mouth every morning.     [provider]  Cyanocobalamin (B-12) 1000 MCG TABS Take 1,000 mcg by mouth daily.    [provider]  FERROUS SULFATE PO Take 325 mg by mouth daily.    [provider]  gabapentin (NEURONTIN) 300 MG capsule Take 300 mg by mouth 4 (four) times daily.  02/28/16   [provider]  glipiZIDE (GLUCOTROL XL) 5 MG 24 hr tablet Take 1  tablet (5 mg total) by mouth daily with breakfast. 04/21/20   Allie Bossier, MD  HYDROcodone-acetaminophen [facility]) 10-325 MG tablet Take 1 tablet by mouth 2 (two) times daily as needed for severe pain. 06/28/20   Dessa Phi, DO  Magnesium Oxide 250 MG TABS Take 250 mg by mouth daily.    [provider]  meclizine (ANTIVERT) 25 MG tablet Take 25 mg by mouth 2 (two) times daily as needed for dizziness. For dizziness    [provider]  methocarbamol (ROBAXIN) 500 MG tablet Take 500 mg by mouth 3 (three) times daily. 05/17/20   [provider]  nitroGLYCERIN (NITROSTAT) 0.4 MG SL tablet Place 1 tablet (0.4 mg total) under the tongue every 5 (five) minutes as needed. Chest pain. 01/18/19   Jettie Booze, MD  pantoprazole (PROTONIX) 40 MG tablet Take 40 mg by mouth daily as needed (INDIGESTION).  01/10/15   [provider]  rosuvastatin (CRESTOR) 5 MG tablet Take 5 mg by mouth daily. Takes on Monday and Friday    [provider]  ticagrelor (BRILINTA) 90 MG TABS tablet Take 1 tablet (90 mg total) by mouth 2 (two) times daily. 04/20/20   Allie Bossier, MD    Allergies    Codeine, Latex, Morphine and related, Cyclobenzaprine, Methocarbamol, and Morphine  Review of Systems   Review of Systems  All other systems reviewed and are negative.  Physical Exam Updated Vital Signs Ht 5\' 5"  (1.651 m)   Wt 75.3 kg   SpO2 92%   BMI 27.62 kg/m   Physical Exam Vitals and nursing note reviewed.  Constitutional:      General: She is not in acute distress.    Appearance: She is well-developed.  HENT:     Head: Normocephalic.     Comments: No scalp tenderness.  Very small cut noted to the right eyebrow medially with Steri-Strips in place.  Minimal tenderness to palpation.  Surrounding ecchymosis noted.    Nose: Nose normal.     Comments: No septal hematoma    Mouth/Throat:     Comments: No malocclusion Eyes:     Extraocular Movements: Extraocular  movements intact.     Conjunctiva/sclera: Conjunctivae normal.     Pupils: Pupils are equal, round, and reactive to light.  Cardiovascular:     Rate and Rhythm: Normal rate and regular rhythm.     Pulses: Normal pulses.     Heart sounds: Normal heart sounds.  Pulmonary:     Effort: Pulmonary effort is normal.  Abdominal:     Palpations: Abdomen is soft.     Tenderness: There is no abdominal tenderness.  Musculoskeletal:        General: Tenderness (Right shoulder: Tenderness to lower deltoid on palpation with normal range of motion.) present.     Cervical back: Normal range of motion and neck supple. No tenderness.     Comments: Faint ecchymosis noted lateral to posterior right elbow with normal elbow flexion extension  Small ecchymosis noted to distal forearm mildly tender to palpation with normal right wrist range of motion and radial pulse 2+.  Normal grip strength  Skin:    Findings: No rash.  Neurological:     Mental Status: She is alert and oriented to person, place, and time.  Psychiatric:        Mood and Affect: Mood normal.    ED Results / Procedures / Treatments   Labs (all labs ordered are listed, but only abnormal results are displayed) Labs Reviewed  BASIC METABOLIC PANEL  CBC WITH DIFFERENTIAL/PLATELET  URINALYSIS, ROUTINE W REFLEX MICROSCOPIC    EKG None  Radiology No results found.  Procedures Procedures   Medications Ordered in ED Medications  Tdap (BOOSTRIX) injection 0.5 mL (has no administration in time range)  acetaminophen (TYLENOL) tablet 1,000 mg (has no administration in time range)    ED Course  I have reviewed the triage vital signs and the nursing notes.  Pertinent labs & imaging results that were available during my care of the patient were reviewed by me and considered in my medical decision making (see chart for details).    MDM Rules/Calculators/A&P                           BP (!) 157/70   Pulse 62   Temp 98.4 F (36.9 C)  (Oral)   Resp 18   Ht 5\' 5"  (1.651 m)   Wt 75.3 kg   SpO2 93%   BMI 27.62 kg/m   Final Clinical Impression(s) / ED Diagnoses Final diagnoses:  None    Rx / DC Orders ED Discharge Orders     None      Patient had a mechanical fall landing on her right side of body specifically her right shoulder and forearm.  She did struck something against her face causing bruising to her right eyebrow and a small cut above her right eyebrow.  She has Steri-Strip placed from her nursing facility, I do not think patient would require additional laceration repair at this time as wound is well approximated.  Will obtain head and cervical spine CT.  Plan to check labs, x-ray of her right shoulder, EKG and UA for further assessment.  Care discussed with Dr. Pearline Cables.  3:17 PM Pt sign out to oncoming team who will f/u on labs and imaging, reassess and determine disposition.    Domenic Moras, PA-C 11/09/20 1518    Jeanell Sparrow, DO 11/09/20 1730

## 2020-11-09 NOTE — Discharge Instructions (Addendum)
Your CT scan of the head and neck were normal.  Your right shoulder x-ray did have a small displaced fracture of the acromion bone.  I have attached orthopedic information for you above.  You have a small cut by your right eyebrow on which you have Steri-Strips placed.  Do not take the Steri-Strips off.  If they fall off on their own that is okay.  Otherwise leave those on for about 7-10 days to allow your wound to heal.  Call orthopedic clinic to schedule your follow-up appointment.  Your blood work was normal with the exception of a potassium level of 5.5.  You received a dose of Lokelma before discharge from the emergency room.  You will need to have your potassium level checked by your facility tomorrow.

## 2020-11-09 NOTE — ED Provider Notes (Addendum)
Signout received from Domenic Moras PA-C for this 80 year old female who presents following a fall while ambulating with breakfast tray and hand.  Patient fell to her right side hitting her right shoulder, and face with some ecchymosis to the right eye and a minor laceration superior and medial to the right eye repaired with Steri-Strips. At signout CT head, CT cervical spine, and right shoulder x-ray are pending.  Patient on exam is doing well has some pain in her right shoulder but otherwise without complaints.  Patient given 5 mg of Norco.  Patient's CT head, CT cervical spine are unremarkable.  Right shoulder x-ray significant for minimally displaced acromion fracture.  Will place patient in sling and give orthopedic referral.  Patient's chemistry panel also significant for potassium of 5.5 otherwise unremarkable.  CBC with hemoglobin 11.9 which is above patient's baseline, platelets of 132 otherwise unremarkable. Patient given 10 mg Lokelma prior to discharge.  Follow-up labs can be obtained at SNF.  Patient is appropriate for discharge back to SNF.    Evlyn Courier, PA-C 11/09/20 [place], [place], PA-C 11/09/20 1752    Jeanell Sparrow, DO 11/10/20 [phone]

## 2020-11-09 NOTE — ED Notes (Signed)
RN called [facility] for report, spoke to main desk but unable to reach RN. [facility] called for transport

## 2020-11-09 NOTE — ED Notes (Signed)
Pt taken to the restroom. 1 assist to bathroom, pt requesting hydrocodone for pain

## 2020-11-09 NOTE — ED Notes (Signed)
Patient transported to CT 

## 2020-11-10 DIAGNOSIS — E119 Type 2 diabetes mellitus without complications: Secondary | ICD-10-CM | POA: Diagnosis not present

## 2020-11-10 DIAGNOSIS — R2681 Unsteadiness on feet: Secondary | ICD-10-CM | POA: Diagnosis not present

## 2020-11-10 DIAGNOSIS — Z743 Need for continuous supervision: Secondary | ICD-10-CM | POA: Diagnosis not present

## 2020-11-10 DIAGNOSIS — G8929 Other chronic pain: Secondary | ICD-10-CM | POA: Diagnosis not present

## 2020-11-10 DIAGNOSIS — S5001XA Contusion of right elbow, initial encounter: Secondary | ICD-10-CM | POA: Diagnosis not present

## 2020-11-10 DIAGNOSIS — Z9181 History of falling: Secondary | ICD-10-CM | POA: Diagnosis not present

## 2020-11-10 DIAGNOSIS — W19XXXA Unspecified fall, initial encounter: Secondary | ICD-10-CM | POA: Diagnosis not present

## 2020-11-10 DIAGNOSIS — N182 Chronic kidney disease, stage 2 (mild): Secondary | ICD-10-CM | POA: Diagnosis not present

## 2020-11-10 DIAGNOSIS — Z741 Need for assistance with personal care: Secondary | ICD-10-CM | POA: Diagnosis not present

## 2020-11-10 DIAGNOSIS — I10 Essential (primary) hypertension: Secondary | ICD-10-CM | POA: Diagnosis not present

## 2020-11-10 DIAGNOSIS — G459 Transient cerebral ischemic attack, unspecified: Secondary | ICD-10-CM | POA: Diagnosis not present

## 2020-11-10 DIAGNOSIS — I509 Heart failure, unspecified: Secondary | ICD-10-CM | POA: Diagnosis not present

## 2020-11-10 DIAGNOSIS — F32A Depression, unspecified: Secondary | ICD-10-CM | POA: Diagnosis not present

## 2020-11-10 DIAGNOSIS — R55 Syncope and collapse: Secondary | ICD-10-CM | POA: Diagnosis not present

## 2020-11-10 DIAGNOSIS — R0902 Hypoxemia: Secondary | ICD-10-CM | POA: Diagnosis not present

## 2020-11-10 DIAGNOSIS — N3000 Acute cystitis without hematuria: Secondary | ICD-10-CM | POA: Diagnosis not present

## 2020-11-10 DIAGNOSIS — M6281 Muscle weakness (generalized): Secondary | ICD-10-CM | POA: Diagnosis not present

## 2020-11-10 MED ORDER — DICLOFENAC SODIUM 1 % EX GEL
4.0000 g | Freq: Once | CUTANEOUS | Status: AC
  Administered 2020-11-10: 4 g via TOPICAL
  Filled 2020-11-10: qty 100

## 2020-11-10 MED ORDER — OXYCODONE HCL 5 MG PO TABS
5.0000 mg | ORAL_TABLET | Freq: Once | ORAL | Status: AC
  Administered 2020-11-10: 5 mg via ORAL
  Filled 2020-11-10: qty 1

## 2020-11-10 NOTE — ED Notes (Signed)
Attempted report to SNF [facility]. Secodnd attempt unsuccessful. Pt transported by [facility] back to facility at this time.

## 2020-11-10 NOTE — ED Notes (Signed)
Pt requesting scheduled home medications. Pharmacy Tech requested and has reviewed medications. PA informed pt's request for home medications.

## 2020-11-10 NOTE — ED Notes (Signed)
Pt assisted multiple times to the bathroom by this RN and NT [place]. Pt upset regarding wait time for ride home and delay on home medications.

## 2020-11-13 ENCOUNTER — Telehealth: Payer: Self-pay | Admitting: *Deleted

## 2020-11-13 DIAGNOSIS — M6281 Muscle weakness (generalized): Secondary | ICD-10-CM | POA: Diagnosis not present

## 2020-11-13 DIAGNOSIS — R55 Syncope and collapse: Secondary | ICD-10-CM | POA: Diagnosis not present

## 2020-11-13 DIAGNOSIS — Z741 Need for assistance with personal care: Secondary | ICD-10-CM | POA: Diagnosis not present

## 2020-11-13 DIAGNOSIS — I10 Essential (primary) hypertension: Secondary | ICD-10-CM | POA: Diagnosis not present

## 2020-11-13 DIAGNOSIS — N182 Chronic kidney disease, stage 2 (mild): Secondary | ICD-10-CM | POA: Diagnosis not present

## 2020-11-13 DIAGNOSIS — G8929 Other chronic pain: Secondary | ICD-10-CM | POA: Diagnosis not present

## 2020-11-13 DIAGNOSIS — I509 Heart failure, unspecified: Secondary | ICD-10-CM | POA: Diagnosis not present

## 2020-11-13 DIAGNOSIS — E119 Type 2 diabetes mellitus without complications: Secondary | ICD-10-CM | POA: Diagnosis not present

## 2020-11-13 DIAGNOSIS — R2681 Unsteadiness on feet: Secondary | ICD-10-CM | POA: Diagnosis not present

## 2020-11-13 DIAGNOSIS — N3000 Acute cystitis without hematuria: Secondary | ICD-10-CM | POA: Diagnosis not present

## 2020-11-13 DIAGNOSIS — Z9181 History of falling: Secondary | ICD-10-CM | POA: Diagnosis not present

## 2020-11-13 NOTE — Telephone Encounter (Signed)
Therapy dept from [facility] called regarding PT recommendations post fall.  RNCM reviewed chart and was unable to locate therapy recommendations.  Information relayed to [facility].  [facility] will proceed with care and develop care plan to progress pt.

## 2020-11-14 DIAGNOSIS — N182 Chronic kidney disease, stage 2 (mild): Secondary | ICD-10-CM | POA: Diagnosis not present

## 2020-11-14 DIAGNOSIS — R2681 Unsteadiness on feet: Secondary | ICD-10-CM | POA: Diagnosis not present

## 2020-11-14 DIAGNOSIS — Z9181 History of falling: Secondary | ICD-10-CM | POA: Diagnosis not present

## 2020-11-14 DIAGNOSIS — I10 Essential (primary) hypertension: Secondary | ICD-10-CM | POA: Diagnosis not present

## 2020-11-14 DIAGNOSIS — N3000 Acute cystitis without hematuria: Secondary | ICD-10-CM | POA: Diagnosis not present

## 2020-11-14 DIAGNOSIS — E119 Type 2 diabetes mellitus without complications: Secondary | ICD-10-CM | POA: Diagnosis not present

## 2020-11-14 DIAGNOSIS — F32A Depression, unspecified: Secondary | ICD-10-CM | POA: Diagnosis not present

## 2020-11-14 DIAGNOSIS — M6281 Muscle weakness (generalized): Secondary | ICD-10-CM | POA: Diagnosis not present

## 2020-11-14 DIAGNOSIS — Z741 Need for assistance with personal care: Secondary | ICD-10-CM | POA: Diagnosis not present

## 2020-11-14 DIAGNOSIS — R55 Syncope and collapse: Secondary | ICD-10-CM | POA: Diagnosis not present

## 2020-11-14 DIAGNOSIS — I509 Heart failure, unspecified: Secondary | ICD-10-CM | POA: Diagnosis not present

## 2020-11-14 DIAGNOSIS — S42101D Fracture of unspecified part of scapula, right shoulder, subsequent encounter for fracture with routine healing: Secondary | ICD-10-CM | POA: Diagnosis not present

## 2020-11-14 DIAGNOSIS — G8929 Other chronic pain: Secondary | ICD-10-CM | POA: Diagnosis not present

## 2020-11-14 DIAGNOSIS — I5032 Chronic diastolic (congestive) heart failure: Secondary | ICD-10-CM | POA: Diagnosis not present

## 2020-11-15 DIAGNOSIS — N3000 Acute cystitis without hematuria: Secondary | ICD-10-CM | POA: Diagnosis not present

## 2020-11-15 DIAGNOSIS — N182 Chronic kidney disease, stage 2 (mild): Secondary | ICD-10-CM | POA: Diagnosis not present

## 2020-11-15 DIAGNOSIS — Z9181 History of falling: Secondary | ICD-10-CM | POA: Diagnosis not present

## 2020-11-15 DIAGNOSIS — I509 Heart failure, unspecified: Secondary | ICD-10-CM | POA: Diagnosis not present

## 2020-11-15 DIAGNOSIS — M6281 Muscle weakness (generalized): Secondary | ICD-10-CM | POA: Diagnosis not present

## 2020-11-15 DIAGNOSIS — R55 Syncope and collapse: Secondary | ICD-10-CM | POA: Diagnosis not present

## 2020-11-15 DIAGNOSIS — E119 Type 2 diabetes mellitus without complications: Secondary | ICD-10-CM | POA: Diagnosis not present

## 2020-11-15 DIAGNOSIS — I10 Essential (primary) hypertension: Secondary | ICD-10-CM | POA: Diagnosis not present

## 2020-11-15 DIAGNOSIS — Z741 Need for assistance with personal care: Secondary | ICD-10-CM | POA: Diagnosis not present

## 2020-11-15 DIAGNOSIS — G8929 Other chronic pain: Secondary | ICD-10-CM | POA: Diagnosis not present

## 2020-11-15 DIAGNOSIS — R2681 Unsteadiness on feet: Secondary | ICD-10-CM | POA: Diagnosis not present

## 2020-11-16 DIAGNOSIS — N182 Chronic kidney disease, stage 2 (mild): Secondary | ICD-10-CM | POA: Diagnosis not present

## 2020-11-16 DIAGNOSIS — Z9181 History of falling: Secondary | ICD-10-CM | POA: Diagnosis not present

## 2020-11-16 DIAGNOSIS — I509 Heart failure, unspecified: Secondary | ICD-10-CM | POA: Diagnosis not present

## 2020-11-16 DIAGNOSIS — M6281 Muscle weakness (generalized): Secondary | ICD-10-CM | POA: Diagnosis not present

## 2020-11-16 DIAGNOSIS — E119 Type 2 diabetes mellitus without complications: Secondary | ICD-10-CM | POA: Diagnosis not present

## 2020-11-16 DIAGNOSIS — N3000 Acute cystitis without hematuria: Secondary | ICD-10-CM | POA: Diagnosis not present

## 2020-11-16 DIAGNOSIS — G8929 Other chronic pain: Secondary | ICD-10-CM | POA: Diagnosis not present

## 2020-11-16 DIAGNOSIS — I10 Essential (primary) hypertension: Secondary | ICD-10-CM | POA: Diagnosis not present

## 2020-11-16 DIAGNOSIS — R55 Syncope and collapse: Secondary | ICD-10-CM | POA: Diagnosis not present

## 2020-11-16 DIAGNOSIS — R2681 Unsteadiness on feet: Secondary | ICD-10-CM | POA: Diagnosis not present

## 2020-11-16 DIAGNOSIS — Z741 Need for assistance with personal care: Secondary | ICD-10-CM | POA: Diagnosis not present

## 2020-11-17 DIAGNOSIS — R2681 Unsteadiness on feet: Secondary | ICD-10-CM | POA: Diagnosis not present

## 2020-11-17 DIAGNOSIS — N182 Chronic kidney disease, stage 2 (mild): Secondary | ICD-10-CM | POA: Diagnosis not present

## 2020-11-17 DIAGNOSIS — I10 Essential (primary) hypertension: Secondary | ICD-10-CM | POA: Diagnosis not present

## 2020-11-17 DIAGNOSIS — N3000 Acute cystitis without hematuria: Secondary | ICD-10-CM | POA: Diagnosis not present

## 2020-11-17 DIAGNOSIS — Z741 Need for assistance with personal care: Secondary | ICD-10-CM | POA: Diagnosis not present

## 2020-11-17 DIAGNOSIS — E119 Type 2 diabetes mellitus without complications: Secondary | ICD-10-CM | POA: Diagnosis not present

## 2020-11-17 DIAGNOSIS — Z9181 History of falling: Secondary | ICD-10-CM | POA: Diagnosis not present

## 2020-11-17 DIAGNOSIS — M6281 Muscle weakness (generalized): Secondary | ICD-10-CM | POA: Diagnosis not present

## 2020-11-17 DIAGNOSIS — R55 Syncope and collapse: Secondary | ICD-10-CM | POA: Diagnosis not present

## 2020-11-17 DIAGNOSIS — G8929 Other chronic pain: Secondary | ICD-10-CM | POA: Diagnosis not present

## 2020-11-17 DIAGNOSIS — I509 Heart failure, unspecified: Secondary | ICD-10-CM | POA: Diagnosis not present

## 2020-11-20 DIAGNOSIS — I509 Heart failure, unspecified: Secondary | ICD-10-CM | POA: Diagnosis not present

## 2020-11-20 DIAGNOSIS — Z9181 History of falling: Secondary | ICD-10-CM | POA: Diagnosis not present

## 2020-11-20 DIAGNOSIS — Z741 Need for assistance with personal care: Secondary | ICD-10-CM | POA: Diagnosis not present

## 2020-11-20 DIAGNOSIS — R55 Syncope and collapse: Secondary | ICD-10-CM | POA: Diagnosis not present

## 2020-11-20 DIAGNOSIS — G8929 Other chronic pain: Secondary | ICD-10-CM | POA: Diagnosis not present

## 2020-11-20 DIAGNOSIS — N182 Chronic kidney disease, stage 2 (mild): Secondary | ICD-10-CM | POA: Diagnosis not present

## 2020-11-20 DIAGNOSIS — R2681 Unsteadiness on feet: Secondary | ICD-10-CM | POA: Diagnosis not present

## 2020-11-20 DIAGNOSIS — E119 Type 2 diabetes mellitus without complications: Secondary | ICD-10-CM | POA: Diagnosis not present

## 2020-11-20 DIAGNOSIS — I10 Essential (primary) hypertension: Secondary | ICD-10-CM | POA: Diagnosis not present

## 2020-11-20 DIAGNOSIS — N3000 Acute cystitis without hematuria: Secondary | ICD-10-CM | POA: Diagnosis not present

## 2020-11-20 DIAGNOSIS — M6281 Muscle weakness (generalized): Secondary | ICD-10-CM | POA: Diagnosis not present

## 2020-11-21 DIAGNOSIS — G8929 Other chronic pain: Secondary | ICD-10-CM | POA: Diagnosis not present

## 2020-11-21 DIAGNOSIS — M6281 Muscle weakness (generalized): Secondary | ICD-10-CM | POA: Diagnosis not present

## 2020-11-21 DIAGNOSIS — N182 Chronic kidney disease, stage 2 (mild): Secondary | ICD-10-CM | POA: Diagnosis not present

## 2020-11-21 DIAGNOSIS — S42121A Displaced fracture of acromial process, right shoulder, initial encounter for closed fracture: Secondary | ICD-10-CM | POA: Insufficient documentation

## 2020-11-21 DIAGNOSIS — Z741 Need for assistance with personal care: Secondary | ICD-10-CM | POA: Diagnosis not present

## 2020-11-21 DIAGNOSIS — R55 Syncope and collapse: Secondary | ICD-10-CM | POA: Diagnosis not present

## 2020-11-21 DIAGNOSIS — I509 Heart failure, unspecified: Secondary | ICD-10-CM | POA: Diagnosis not present

## 2020-11-21 DIAGNOSIS — I10 Essential (primary) hypertension: Secondary | ICD-10-CM | POA: Diagnosis not present

## 2020-11-21 DIAGNOSIS — R2681 Unsteadiness on feet: Secondary | ICD-10-CM | POA: Diagnosis not present

## 2020-11-21 DIAGNOSIS — Z9181 History of falling: Secondary | ICD-10-CM | POA: Diagnosis not present

## 2020-11-21 DIAGNOSIS — N3000 Acute cystitis without hematuria: Secondary | ICD-10-CM | POA: Diagnosis not present

## 2020-11-21 DIAGNOSIS — E119 Type 2 diabetes mellitus without complications: Secondary | ICD-10-CM | POA: Diagnosis not present

## 2020-11-22 DIAGNOSIS — R2681 Unsteadiness on feet: Secondary | ICD-10-CM | POA: Diagnosis not present

## 2020-11-22 DIAGNOSIS — M6281 Muscle weakness (generalized): Secondary | ICD-10-CM | POA: Diagnosis not present

## 2020-11-22 DIAGNOSIS — I509 Heart failure, unspecified: Secondary | ICD-10-CM | POA: Diagnosis not present

## 2020-11-22 DIAGNOSIS — S42101D Fracture of unspecified part of scapula, right shoulder, subsequent encounter for fracture with routine healing: Secondary | ICD-10-CM | POA: Diagnosis not present

## 2020-11-22 DIAGNOSIS — I10 Essential (primary) hypertension: Secondary | ICD-10-CM | POA: Diagnosis not present

## 2020-11-22 DIAGNOSIS — I5032 Chronic diastolic (congestive) heart failure: Secondary | ICD-10-CM | POA: Diagnosis not present

## 2020-11-22 DIAGNOSIS — F32A Depression, unspecified: Secondary | ICD-10-CM | POA: Diagnosis not present

## 2020-11-22 DIAGNOSIS — Z741 Need for assistance with personal care: Secondary | ICD-10-CM | POA: Diagnosis not present

## 2020-11-22 DIAGNOSIS — R609 Edema, unspecified: Secondary | ICD-10-CM | POA: Diagnosis not present

## 2020-11-22 DIAGNOSIS — N182 Chronic kidney disease, stage 2 (mild): Secondary | ICD-10-CM | POA: Diagnosis not present

## 2020-11-22 DIAGNOSIS — E119 Type 2 diabetes mellitus without complications: Secondary | ICD-10-CM | POA: Diagnosis not present

## 2020-11-22 DIAGNOSIS — N3000 Acute cystitis without hematuria: Secondary | ICD-10-CM | POA: Diagnosis not present

## 2020-11-22 DIAGNOSIS — G8929 Other chronic pain: Secondary | ICD-10-CM | POA: Diagnosis not present

## 2020-11-22 DIAGNOSIS — Z9181 History of falling: Secondary | ICD-10-CM | POA: Diagnosis not present

## 2020-11-22 DIAGNOSIS — R55 Syncope and collapse: Secondary | ICD-10-CM | POA: Diagnosis not present

## 2020-11-23 DIAGNOSIS — Z741 Need for assistance with personal care: Secondary | ICD-10-CM | POA: Diagnosis not present

## 2020-11-23 DIAGNOSIS — I10 Essential (primary) hypertension: Secondary | ICD-10-CM | POA: Diagnosis not present

## 2020-11-23 DIAGNOSIS — R55 Syncope and collapse: Secondary | ICD-10-CM | POA: Diagnosis not present

## 2020-11-23 DIAGNOSIS — Z9181 History of falling: Secondary | ICD-10-CM | POA: Diagnosis not present

## 2020-11-23 DIAGNOSIS — M6281 Muscle weakness (generalized): Secondary | ICD-10-CM | POA: Diagnosis not present

## 2020-11-23 DIAGNOSIS — I509 Heart failure, unspecified: Secondary | ICD-10-CM | POA: Diagnosis not present

## 2020-11-23 DIAGNOSIS — E119 Type 2 diabetes mellitus without complications: Secondary | ICD-10-CM | POA: Diagnosis not present

## 2020-11-23 DIAGNOSIS — N3000 Acute cystitis without hematuria: Secondary | ICD-10-CM | POA: Diagnosis not present

## 2020-11-23 DIAGNOSIS — R2681 Unsteadiness on feet: Secondary | ICD-10-CM | POA: Diagnosis not present

## 2020-11-23 DIAGNOSIS — M25511 Pain in right shoulder: Secondary | ICD-10-CM | POA: Diagnosis not present

## 2020-11-23 DIAGNOSIS — G8929 Other chronic pain: Secondary | ICD-10-CM | POA: Diagnosis not present

## 2020-11-23 DIAGNOSIS — N182 Chronic kidney disease, stage 2 (mild): Secondary | ICD-10-CM | POA: Diagnosis not present

## 2020-11-24 DIAGNOSIS — N3000 Acute cystitis without hematuria: Secondary | ICD-10-CM | POA: Diagnosis not present

## 2020-11-24 DIAGNOSIS — E119 Type 2 diabetes mellitus without complications: Secondary | ICD-10-CM | POA: Diagnosis not present

## 2020-11-24 DIAGNOSIS — I5032 Chronic diastolic (congestive) heart failure: Secondary | ICD-10-CM | POA: Diagnosis not present

## 2020-11-24 DIAGNOSIS — Z9181 History of falling: Secondary | ICD-10-CM | POA: Diagnosis not present

## 2020-11-24 DIAGNOSIS — N182 Chronic kidney disease, stage 2 (mild): Secondary | ICD-10-CM | POA: Diagnosis not present

## 2020-11-24 DIAGNOSIS — M6281 Muscle weakness (generalized): Secondary | ICD-10-CM | POA: Diagnosis not present

## 2020-11-24 DIAGNOSIS — Z741 Need for assistance with personal care: Secondary | ICD-10-CM | POA: Diagnosis not present

## 2020-11-24 DIAGNOSIS — I509 Heart failure, unspecified: Secondary | ICD-10-CM | POA: Diagnosis not present

## 2020-11-24 DIAGNOSIS — G8929 Other chronic pain: Secondary | ICD-10-CM | POA: Diagnosis not present

## 2020-11-24 DIAGNOSIS — R55 Syncope and collapse: Secondary | ICD-10-CM | POA: Diagnosis not present

## 2020-11-24 DIAGNOSIS — G629 Polyneuropathy, unspecified: Secondary | ICD-10-CM | POA: Diagnosis not present

## 2020-11-24 DIAGNOSIS — I10 Essential (primary) hypertension: Secondary | ICD-10-CM | POA: Diagnosis not present

## 2020-11-24 DIAGNOSIS — M549 Dorsalgia, unspecified: Secondary | ICD-10-CM | POA: Diagnosis not present

## 2020-11-24 DIAGNOSIS — R2681 Unsteadiness on feet: Secondary | ICD-10-CM | POA: Diagnosis not present

## 2020-11-24 DIAGNOSIS — S42101D Fracture of unspecified part of scapula, right shoulder, subsequent encounter for fracture with routine healing: Secondary | ICD-10-CM | POA: Diagnosis not present

## 2020-11-26 ENCOUNTER — Other Ambulatory Visit: Payer: Self-pay

## 2020-11-26 DIAGNOSIS — I6523 Occlusion and stenosis of bilateral carotid arteries: Secondary | ICD-10-CM

## 2020-11-27 DIAGNOSIS — M6281 Muscle weakness (generalized): Secondary | ICD-10-CM | POA: Diagnosis not present

## 2020-11-27 DIAGNOSIS — E119 Type 2 diabetes mellitus without complications: Secondary | ICD-10-CM | POA: Diagnosis not present

## 2020-11-27 DIAGNOSIS — R55 Syncope and collapse: Secondary | ICD-10-CM | POA: Diagnosis not present

## 2020-11-27 DIAGNOSIS — Z9181 History of falling: Secondary | ICD-10-CM | POA: Diagnosis not present

## 2020-11-27 DIAGNOSIS — R2681 Unsteadiness on feet: Secondary | ICD-10-CM | POA: Diagnosis not present

## 2020-11-27 DIAGNOSIS — N182 Chronic kidney disease, stage 2 (mild): Secondary | ICD-10-CM | POA: Diagnosis not present

## 2020-11-27 DIAGNOSIS — I509 Heart failure, unspecified: Secondary | ICD-10-CM | POA: Diagnosis not present

## 2020-11-27 DIAGNOSIS — I10 Essential (primary) hypertension: Secondary | ICD-10-CM | POA: Diagnosis not present

## 2020-11-27 DIAGNOSIS — G8929 Other chronic pain: Secondary | ICD-10-CM | POA: Diagnosis not present

## 2020-11-27 DIAGNOSIS — N3000 Acute cystitis without hematuria: Secondary | ICD-10-CM | POA: Diagnosis not present

## 2020-11-27 DIAGNOSIS — Z741 Need for assistance with personal care: Secondary | ICD-10-CM | POA: Diagnosis not present

## 2020-11-28 DIAGNOSIS — N3000 Acute cystitis without hematuria: Secondary | ICD-10-CM | POA: Diagnosis not present

## 2020-11-28 DIAGNOSIS — G8929 Other chronic pain: Secondary | ICD-10-CM | POA: Diagnosis not present

## 2020-11-28 DIAGNOSIS — N182 Chronic kidney disease, stage 2 (mild): Secondary | ICD-10-CM | POA: Diagnosis not present

## 2020-11-28 DIAGNOSIS — R55 Syncope and collapse: Secondary | ICD-10-CM | POA: Diagnosis not present

## 2020-11-28 DIAGNOSIS — M6281 Muscle weakness (generalized): Secondary | ICD-10-CM | POA: Diagnosis not present

## 2020-11-28 DIAGNOSIS — Z9181 History of falling: Secondary | ICD-10-CM | POA: Diagnosis not present

## 2020-11-28 DIAGNOSIS — R2681 Unsteadiness on feet: Secondary | ICD-10-CM | POA: Diagnosis not present

## 2020-11-28 DIAGNOSIS — E119 Type 2 diabetes mellitus without complications: Secondary | ICD-10-CM | POA: Diagnosis not present

## 2020-11-28 DIAGNOSIS — Z741 Need for assistance with personal care: Secondary | ICD-10-CM | POA: Diagnosis not present

## 2020-11-28 DIAGNOSIS — I10 Essential (primary) hypertension: Secondary | ICD-10-CM | POA: Diagnosis not present

## 2020-11-28 DIAGNOSIS — I509 Heart failure, unspecified: Secondary | ICD-10-CM | POA: Diagnosis not present

## 2020-11-29 DIAGNOSIS — Z9181 History of falling: Secondary | ICD-10-CM | POA: Diagnosis not present

## 2020-11-29 DIAGNOSIS — M6281 Muscle weakness (generalized): Secondary | ICD-10-CM | POA: Diagnosis not present

## 2020-11-29 DIAGNOSIS — I10 Essential (primary) hypertension: Secondary | ICD-10-CM | POA: Diagnosis not present

## 2020-11-29 DIAGNOSIS — G8929 Other chronic pain: Secondary | ICD-10-CM | POA: Diagnosis not present

## 2020-11-29 DIAGNOSIS — Z741 Need for assistance with personal care: Secondary | ICD-10-CM | POA: Diagnosis not present

## 2020-11-29 DIAGNOSIS — N3000 Acute cystitis without hematuria: Secondary | ICD-10-CM | POA: Diagnosis not present

## 2020-11-29 DIAGNOSIS — R2681 Unsteadiness on feet: Secondary | ICD-10-CM | POA: Diagnosis not present

## 2020-11-29 DIAGNOSIS — N182 Chronic kidney disease, stage 2 (mild): Secondary | ICD-10-CM | POA: Diagnosis not present

## 2020-11-29 DIAGNOSIS — I509 Heart failure, unspecified: Secondary | ICD-10-CM | POA: Diagnosis not present

## 2020-11-29 DIAGNOSIS — E119 Type 2 diabetes mellitus without complications: Secondary | ICD-10-CM | POA: Diagnosis not present

## 2020-11-29 DIAGNOSIS — R55 Syncope and collapse: Secondary | ICD-10-CM | POA: Diagnosis not present

## 2020-11-30 DIAGNOSIS — I10 Essential (primary) hypertension: Secondary | ICD-10-CM | POA: Diagnosis not present

## 2020-11-30 DIAGNOSIS — G8929 Other chronic pain: Secondary | ICD-10-CM | POA: Diagnosis not present

## 2020-11-30 DIAGNOSIS — N182 Chronic kidney disease, stage 2 (mild): Secondary | ICD-10-CM | POA: Diagnosis not present

## 2020-11-30 DIAGNOSIS — Z741 Need for assistance with personal care: Secondary | ICD-10-CM | POA: Diagnosis not present

## 2020-11-30 DIAGNOSIS — N3000 Acute cystitis without hematuria: Secondary | ICD-10-CM | POA: Diagnosis not present

## 2020-11-30 DIAGNOSIS — Z9181 History of falling: Secondary | ICD-10-CM | POA: Diagnosis not present

## 2020-11-30 DIAGNOSIS — R55 Syncope and collapse: Secondary | ICD-10-CM | POA: Diagnosis not present

## 2020-11-30 DIAGNOSIS — I509 Heart failure, unspecified: Secondary | ICD-10-CM | POA: Diagnosis not present

## 2020-11-30 DIAGNOSIS — M6281 Muscle weakness (generalized): Secondary | ICD-10-CM | POA: Diagnosis not present

## 2020-11-30 DIAGNOSIS — R2681 Unsteadiness on feet: Secondary | ICD-10-CM | POA: Diagnosis not present

## 2020-11-30 DIAGNOSIS — E119 Type 2 diabetes mellitus without complications: Secondary | ICD-10-CM | POA: Diagnosis not present

## 2020-12-01 DIAGNOSIS — E119 Type 2 diabetes mellitus without complications: Secondary | ICD-10-CM | POA: Diagnosis not present

## 2020-12-01 DIAGNOSIS — N182 Chronic kidney disease, stage 2 (mild): Secondary | ICD-10-CM | POA: Diagnosis not present

## 2020-12-01 DIAGNOSIS — M6281 Muscle weakness (generalized): Secondary | ICD-10-CM | POA: Diagnosis not present

## 2020-12-01 DIAGNOSIS — F32A Depression, unspecified: Secondary | ICD-10-CM | POA: Diagnosis not present

## 2020-12-01 DIAGNOSIS — Z741 Need for assistance with personal care: Secondary | ICD-10-CM | POA: Diagnosis not present

## 2020-12-01 DIAGNOSIS — N3000 Acute cystitis without hematuria: Secondary | ICD-10-CM | POA: Diagnosis not present

## 2020-12-01 DIAGNOSIS — I10 Essential (primary) hypertension: Secondary | ICD-10-CM | POA: Diagnosis not present

## 2020-12-01 DIAGNOSIS — G8929 Other chronic pain: Secondary | ICD-10-CM | POA: Diagnosis not present

## 2020-12-01 DIAGNOSIS — S42101D Fracture of unspecified part of scapula, right shoulder, subsequent encounter for fracture with routine healing: Secondary | ICD-10-CM | POA: Diagnosis not present

## 2020-12-01 DIAGNOSIS — R2681 Unsteadiness on feet: Secondary | ICD-10-CM | POA: Diagnosis not present

## 2020-12-01 DIAGNOSIS — I5032 Chronic diastolic (congestive) heart failure: Secondary | ICD-10-CM | POA: Diagnosis not present

## 2020-12-01 DIAGNOSIS — R55 Syncope and collapse: Secondary | ICD-10-CM | POA: Diagnosis not present

## 2020-12-01 DIAGNOSIS — Z9181 History of falling: Secondary | ICD-10-CM | POA: Diagnosis not present

## 2020-12-01 DIAGNOSIS — M549 Dorsalgia, unspecified: Secondary | ICD-10-CM | POA: Diagnosis not present

## 2020-12-01 DIAGNOSIS — I509 Heart failure, unspecified: Secondary | ICD-10-CM | POA: Diagnosis not present

## 2020-12-02 DIAGNOSIS — M6281 Muscle weakness (generalized): Secondary | ICD-10-CM | POA: Diagnosis not present

## 2020-12-02 DIAGNOSIS — N182 Chronic kidney disease, stage 2 (mild): Secondary | ICD-10-CM | POA: Diagnosis not present

## 2020-12-02 DIAGNOSIS — R55 Syncope and collapse: Secondary | ICD-10-CM | POA: Diagnosis not present

## 2020-12-02 DIAGNOSIS — R2681 Unsteadiness on feet: Secondary | ICD-10-CM | POA: Diagnosis not present

## 2020-12-02 DIAGNOSIS — Z741 Need for assistance with personal care: Secondary | ICD-10-CM | POA: Diagnosis not present

## 2020-12-02 DIAGNOSIS — G8929 Other chronic pain: Secondary | ICD-10-CM | POA: Diagnosis not present

## 2020-12-02 DIAGNOSIS — Z9181 History of falling: Secondary | ICD-10-CM | POA: Diagnosis not present

## 2020-12-02 DIAGNOSIS — E119 Type 2 diabetes mellitus without complications: Secondary | ICD-10-CM | POA: Diagnosis not present

## 2020-12-02 DIAGNOSIS — I10 Essential (primary) hypertension: Secondary | ICD-10-CM | POA: Diagnosis not present

## 2020-12-02 DIAGNOSIS — N3000 Acute cystitis without hematuria: Secondary | ICD-10-CM | POA: Diagnosis not present

## 2020-12-02 DIAGNOSIS — I509 Heart failure, unspecified: Secondary | ICD-10-CM | POA: Diagnosis not present

## 2020-12-03 DIAGNOSIS — N182 Chronic kidney disease, stage 2 (mild): Secondary | ICD-10-CM | POA: Diagnosis not present

## 2020-12-03 DIAGNOSIS — Z741 Need for assistance with personal care: Secondary | ICD-10-CM | POA: Diagnosis not present

## 2020-12-03 DIAGNOSIS — G8929 Other chronic pain: Secondary | ICD-10-CM | POA: Diagnosis not present

## 2020-12-03 DIAGNOSIS — I509 Heart failure, unspecified: Secondary | ICD-10-CM | POA: Diagnosis not present

## 2020-12-03 DIAGNOSIS — R55 Syncope and collapse: Secondary | ICD-10-CM | POA: Diagnosis not present

## 2020-12-03 DIAGNOSIS — I10 Essential (primary) hypertension: Secondary | ICD-10-CM | POA: Diagnosis not present

## 2020-12-03 DIAGNOSIS — E119 Type 2 diabetes mellitus without complications: Secondary | ICD-10-CM | POA: Diagnosis not present

## 2020-12-03 DIAGNOSIS — R2681 Unsteadiness on feet: Secondary | ICD-10-CM | POA: Diagnosis not present

## 2020-12-03 DIAGNOSIS — N3000 Acute cystitis without hematuria: Secondary | ICD-10-CM | POA: Diagnosis not present

## 2020-12-03 DIAGNOSIS — Z9181 History of falling: Secondary | ICD-10-CM | POA: Diagnosis not present

## 2020-12-03 DIAGNOSIS — M6281 Muscle weakness (generalized): Secondary | ICD-10-CM | POA: Diagnosis not present

## 2020-12-04 DIAGNOSIS — Z9181 History of falling: Secondary | ICD-10-CM | POA: Diagnosis not present

## 2020-12-04 DIAGNOSIS — I509 Heart failure, unspecified: Secondary | ICD-10-CM | POA: Diagnosis not present

## 2020-12-04 DIAGNOSIS — N182 Chronic kidney disease, stage 2 (mild): Secondary | ICD-10-CM | POA: Diagnosis not present

## 2020-12-04 DIAGNOSIS — N3000 Acute cystitis without hematuria: Secondary | ICD-10-CM | POA: Diagnosis not present

## 2020-12-04 DIAGNOSIS — R55 Syncope and collapse: Secondary | ICD-10-CM | POA: Diagnosis not present

## 2020-12-04 DIAGNOSIS — G8929 Other chronic pain: Secondary | ICD-10-CM | POA: Diagnosis not present

## 2020-12-04 DIAGNOSIS — Z741 Need for assistance with personal care: Secondary | ICD-10-CM | POA: Diagnosis not present

## 2020-12-04 DIAGNOSIS — E119 Type 2 diabetes mellitus without complications: Secondary | ICD-10-CM | POA: Diagnosis not present

## 2020-12-04 DIAGNOSIS — I10 Essential (primary) hypertension: Secondary | ICD-10-CM | POA: Diagnosis not present

## 2020-12-04 DIAGNOSIS — R2681 Unsteadiness on feet: Secondary | ICD-10-CM | POA: Diagnosis not present

## 2020-12-04 DIAGNOSIS — M6281 Muscle weakness (generalized): Secondary | ICD-10-CM | POA: Diagnosis not present

## 2020-12-05 DIAGNOSIS — N182 Chronic kidney disease, stage 2 (mild): Secondary | ICD-10-CM | POA: Diagnosis not present

## 2020-12-05 DIAGNOSIS — G8929 Other chronic pain: Secondary | ICD-10-CM | POA: Diagnosis not present

## 2020-12-05 DIAGNOSIS — I509 Heart failure, unspecified: Secondary | ICD-10-CM | POA: Diagnosis not present

## 2020-12-05 DIAGNOSIS — E119 Type 2 diabetes mellitus without complications: Secondary | ICD-10-CM | POA: Diagnosis not present

## 2020-12-05 DIAGNOSIS — Z741 Need for assistance with personal care: Secondary | ICD-10-CM | POA: Diagnosis not present

## 2020-12-05 DIAGNOSIS — R2681 Unsteadiness on feet: Secondary | ICD-10-CM | POA: Diagnosis not present

## 2020-12-05 DIAGNOSIS — Z9181 History of falling: Secondary | ICD-10-CM | POA: Diagnosis not present

## 2020-12-05 DIAGNOSIS — R55 Syncope and collapse: Secondary | ICD-10-CM | POA: Diagnosis not present

## 2020-12-05 DIAGNOSIS — N3000 Acute cystitis without hematuria: Secondary | ICD-10-CM | POA: Diagnosis not present

## 2020-12-05 DIAGNOSIS — M6281 Muscle weakness (generalized): Secondary | ICD-10-CM | POA: Diagnosis not present

## 2020-12-05 DIAGNOSIS — I10 Essential (primary) hypertension: Secondary | ICD-10-CM | POA: Diagnosis not present

## 2020-12-06 DIAGNOSIS — I509 Heart failure, unspecified: Secondary | ICD-10-CM | POA: Diagnosis not present

## 2020-12-06 DIAGNOSIS — N182 Chronic kidney disease, stage 2 (mild): Secondary | ICD-10-CM | POA: Diagnosis not present

## 2020-12-06 DIAGNOSIS — R55 Syncope and collapse: Secondary | ICD-10-CM | POA: Diagnosis not present

## 2020-12-06 DIAGNOSIS — I10 Essential (primary) hypertension: Secondary | ICD-10-CM | POA: Diagnosis not present

## 2020-12-06 DIAGNOSIS — E119 Type 2 diabetes mellitus without complications: Secondary | ICD-10-CM | POA: Diagnosis not present

## 2020-12-06 DIAGNOSIS — G8929 Other chronic pain: Secondary | ICD-10-CM | POA: Diagnosis not present

## 2020-12-06 DIAGNOSIS — N3000 Acute cystitis without hematuria: Secondary | ICD-10-CM | POA: Diagnosis not present

## 2020-12-06 DIAGNOSIS — Z9181 History of falling: Secondary | ICD-10-CM | POA: Diagnosis not present

## 2020-12-06 DIAGNOSIS — R2681 Unsteadiness on feet: Secondary | ICD-10-CM | POA: Diagnosis not present

## 2020-12-06 DIAGNOSIS — Z741 Need for assistance with personal care: Secondary | ICD-10-CM | POA: Diagnosis not present

## 2020-12-06 DIAGNOSIS — M6281 Muscle weakness (generalized): Secondary | ICD-10-CM | POA: Diagnosis not present

## 2020-12-07 DIAGNOSIS — I10 Essential (primary) hypertension: Secondary | ICD-10-CM | POA: Diagnosis not present

## 2020-12-07 DIAGNOSIS — E119 Type 2 diabetes mellitus without complications: Secondary | ICD-10-CM | POA: Diagnosis not present

## 2020-12-07 DIAGNOSIS — R55 Syncope and collapse: Secondary | ICD-10-CM | POA: Diagnosis not present

## 2020-12-07 DIAGNOSIS — G8929 Other chronic pain: Secondary | ICD-10-CM | POA: Diagnosis not present

## 2020-12-07 DIAGNOSIS — Z9181 History of falling: Secondary | ICD-10-CM | POA: Diagnosis not present

## 2020-12-07 DIAGNOSIS — M6281 Muscle weakness (generalized): Secondary | ICD-10-CM | POA: Diagnosis not present

## 2020-12-07 DIAGNOSIS — I509 Heart failure, unspecified: Secondary | ICD-10-CM | POA: Diagnosis not present

## 2020-12-07 DIAGNOSIS — R2681 Unsteadiness on feet: Secondary | ICD-10-CM | POA: Diagnosis not present

## 2020-12-07 DIAGNOSIS — N182 Chronic kidney disease, stage 2 (mild): Secondary | ICD-10-CM | POA: Diagnosis not present

## 2020-12-07 DIAGNOSIS — Z741 Need for assistance with personal care: Secondary | ICD-10-CM | POA: Diagnosis not present

## 2020-12-07 DIAGNOSIS — N3000 Acute cystitis without hematuria: Secondary | ICD-10-CM | POA: Diagnosis not present

## 2020-12-08 DIAGNOSIS — Z9181 History of falling: Secondary | ICD-10-CM | POA: Diagnosis not present

## 2020-12-08 DIAGNOSIS — Z741 Need for assistance with personal care: Secondary | ICD-10-CM | POA: Diagnosis not present

## 2020-12-08 DIAGNOSIS — N182 Chronic kidney disease, stage 2 (mild): Secondary | ICD-10-CM | POA: Diagnosis not present

## 2020-12-08 DIAGNOSIS — I10 Essential (primary) hypertension: Secondary | ICD-10-CM | POA: Diagnosis not present

## 2020-12-08 DIAGNOSIS — R55 Syncope and collapse: Secondary | ICD-10-CM | POA: Diagnosis not present

## 2020-12-08 DIAGNOSIS — G8929 Other chronic pain: Secondary | ICD-10-CM | POA: Diagnosis not present

## 2020-12-08 DIAGNOSIS — N3000 Acute cystitis without hematuria: Secondary | ICD-10-CM | POA: Diagnosis not present

## 2020-12-08 DIAGNOSIS — M6281 Muscle weakness (generalized): Secondary | ICD-10-CM | POA: Diagnosis not present

## 2020-12-08 DIAGNOSIS — I509 Heart failure, unspecified: Secondary | ICD-10-CM | POA: Diagnosis not present

## 2020-12-08 DIAGNOSIS — E119 Type 2 diabetes mellitus without complications: Secondary | ICD-10-CM | POA: Diagnosis not present

## 2020-12-08 DIAGNOSIS — R2681 Unsteadiness on feet: Secondary | ICD-10-CM | POA: Diagnosis not present

## 2020-12-11 DIAGNOSIS — M6281 Muscle weakness (generalized): Secondary | ICD-10-CM | POA: Diagnosis not present

## 2020-12-11 DIAGNOSIS — G8929 Other chronic pain: Secondary | ICD-10-CM | POA: Diagnosis not present

## 2020-12-11 DIAGNOSIS — R2681 Unsteadiness on feet: Secondary | ICD-10-CM | POA: Diagnosis not present

## 2020-12-11 DIAGNOSIS — Z741 Need for assistance with personal care: Secondary | ICD-10-CM | POA: Diagnosis not present

## 2020-12-11 DIAGNOSIS — R55 Syncope and collapse: Secondary | ICD-10-CM | POA: Diagnosis not present

## 2020-12-11 DIAGNOSIS — E119 Type 2 diabetes mellitus without complications: Secondary | ICD-10-CM | POA: Diagnosis not present

## 2020-12-11 DIAGNOSIS — I10 Essential (primary) hypertension: Secondary | ICD-10-CM | POA: Diagnosis not present

## 2020-12-11 DIAGNOSIS — N3000 Acute cystitis without hematuria: Secondary | ICD-10-CM | POA: Diagnosis not present

## 2020-12-11 DIAGNOSIS — Z9181 History of falling: Secondary | ICD-10-CM | POA: Diagnosis not present

## 2020-12-11 DIAGNOSIS — I509 Heart failure, unspecified: Secondary | ICD-10-CM | POA: Diagnosis not present

## 2020-12-11 DIAGNOSIS — N182 Chronic kidney disease, stage 2 (mild): Secondary | ICD-10-CM | POA: Diagnosis not present

## 2020-12-12 DIAGNOSIS — I10 Essential (primary) hypertension: Secondary | ICD-10-CM | POA: Diagnosis not present

## 2020-12-12 DIAGNOSIS — N3000 Acute cystitis without hematuria: Secondary | ICD-10-CM | POA: Diagnosis not present

## 2020-12-12 DIAGNOSIS — I509 Heart failure, unspecified: Secondary | ICD-10-CM | POA: Diagnosis not present

## 2020-12-12 DIAGNOSIS — F32A Depression, unspecified: Secondary | ICD-10-CM | POA: Diagnosis not present

## 2020-12-12 DIAGNOSIS — S42101D Fracture of unspecified part of scapula, right shoulder, subsequent encounter for fracture with routine healing: Secondary | ICD-10-CM | POA: Diagnosis not present

## 2020-12-12 DIAGNOSIS — R55 Syncope and collapse: Secondary | ICD-10-CM | POA: Diagnosis not present

## 2020-12-12 DIAGNOSIS — M549 Dorsalgia, unspecified: Secondary | ICD-10-CM | POA: Diagnosis not present

## 2020-12-12 DIAGNOSIS — G8929 Other chronic pain: Secondary | ICD-10-CM | POA: Diagnosis not present

## 2020-12-12 DIAGNOSIS — R2681 Unsteadiness on feet: Secondary | ICD-10-CM | POA: Diagnosis not present

## 2020-12-12 DIAGNOSIS — Z9181 History of falling: Secondary | ICD-10-CM | POA: Diagnosis not present

## 2020-12-12 DIAGNOSIS — M6281 Muscle weakness (generalized): Secondary | ICD-10-CM | POA: Diagnosis not present

## 2020-12-12 DIAGNOSIS — I5032 Chronic diastolic (congestive) heart failure: Secondary | ICD-10-CM | POA: Diagnosis not present

## 2020-12-12 DIAGNOSIS — Z741 Need for assistance with personal care: Secondary | ICD-10-CM | POA: Diagnosis not present

## 2020-12-12 DIAGNOSIS — N182 Chronic kidney disease, stage 2 (mild): Secondary | ICD-10-CM | POA: Diagnosis not present

## 2020-12-12 DIAGNOSIS — E119 Type 2 diabetes mellitus without complications: Secondary | ICD-10-CM | POA: Diagnosis not present

## 2020-12-13 DIAGNOSIS — Z9181 History of falling: Secondary | ICD-10-CM | POA: Diagnosis not present

## 2020-12-13 DIAGNOSIS — E119 Type 2 diabetes mellitus without complications: Secondary | ICD-10-CM | POA: Diagnosis not present

## 2020-12-13 DIAGNOSIS — R2681 Unsteadiness on feet: Secondary | ICD-10-CM | POA: Diagnosis not present

## 2020-12-13 DIAGNOSIS — R55 Syncope and collapse: Secondary | ICD-10-CM | POA: Diagnosis not present

## 2020-12-13 DIAGNOSIS — Z741 Need for assistance with personal care: Secondary | ICD-10-CM | POA: Diagnosis not present

## 2020-12-13 DIAGNOSIS — G8929 Other chronic pain: Secondary | ICD-10-CM | POA: Diagnosis not present

## 2020-12-13 DIAGNOSIS — N3000 Acute cystitis without hematuria: Secondary | ICD-10-CM | POA: Diagnosis not present

## 2020-12-13 DIAGNOSIS — I10 Essential (primary) hypertension: Secondary | ICD-10-CM | POA: Diagnosis not present

## 2020-12-13 DIAGNOSIS — M6281 Muscle weakness (generalized): Secondary | ICD-10-CM | POA: Diagnosis not present

## 2020-12-13 DIAGNOSIS — N182 Chronic kidney disease, stage 2 (mild): Secondary | ICD-10-CM | POA: Diagnosis not present

## 2020-12-13 DIAGNOSIS — I509 Heart failure, unspecified: Secondary | ICD-10-CM | POA: Diagnosis not present

## 2020-12-14 DIAGNOSIS — M6281 Muscle weakness (generalized): Secondary | ICD-10-CM | POA: Diagnosis not present

## 2020-12-14 DIAGNOSIS — N182 Chronic kidney disease, stage 2 (mild): Secondary | ICD-10-CM | POA: Diagnosis not present

## 2020-12-14 DIAGNOSIS — I10 Essential (primary) hypertension: Secondary | ICD-10-CM | POA: Diagnosis not present

## 2020-12-14 DIAGNOSIS — M25511 Pain in right shoulder: Secondary | ICD-10-CM | POA: Diagnosis not present

## 2020-12-14 DIAGNOSIS — R2681 Unsteadiness on feet: Secondary | ICD-10-CM | POA: Diagnosis not present

## 2020-12-14 DIAGNOSIS — Z741 Need for assistance with personal care: Secondary | ICD-10-CM | POA: Diagnosis not present

## 2020-12-14 DIAGNOSIS — R55 Syncope and collapse: Secondary | ICD-10-CM | POA: Diagnosis not present

## 2020-12-14 DIAGNOSIS — E119 Type 2 diabetes mellitus without complications: Secondary | ICD-10-CM | POA: Diagnosis not present

## 2020-12-14 DIAGNOSIS — N3000 Acute cystitis without hematuria: Secondary | ICD-10-CM | POA: Diagnosis not present

## 2020-12-14 DIAGNOSIS — G8929 Other chronic pain: Secondary | ICD-10-CM | POA: Diagnosis not present

## 2020-12-14 DIAGNOSIS — I509 Heart failure, unspecified: Secondary | ICD-10-CM | POA: Diagnosis not present

## 2020-12-14 DIAGNOSIS — Z9181 History of falling: Secondary | ICD-10-CM | POA: Diagnosis not present

## 2020-12-15 DIAGNOSIS — I951 Orthostatic hypotension: Secondary | ICD-10-CM | POA: Diagnosis not present

## 2020-12-15 DIAGNOSIS — I2510 Atherosclerotic heart disease of native coronary artery without angina pectoris: Secondary | ICD-10-CM | POA: Diagnosis not present

## 2020-12-15 DIAGNOSIS — N182 Chronic kidney disease, stage 2 (mild): Secondary | ICD-10-CM | POA: Diagnosis not present

## 2020-12-15 DIAGNOSIS — G8929 Other chronic pain: Secondary | ICD-10-CM | POA: Diagnosis not present

## 2020-12-15 DIAGNOSIS — N3000 Acute cystitis without hematuria: Secondary | ICD-10-CM | POA: Diagnosis not present

## 2020-12-15 DIAGNOSIS — N390 Urinary tract infection, site not specified: Secondary | ICD-10-CM | POA: Diagnosis not present

## 2020-12-15 DIAGNOSIS — M6281 Muscle weakness (generalized): Secondary | ICD-10-CM | POA: Diagnosis not present

## 2020-12-15 DIAGNOSIS — I509 Heart failure, unspecified: Secondary | ICD-10-CM | POA: Diagnosis not present

## 2020-12-15 DIAGNOSIS — R55 Syncope and collapse: Secondary | ICD-10-CM | POA: Diagnosis not present

## 2020-12-15 DIAGNOSIS — W19XXXA Unspecified fall, initial encounter: Secondary | ICD-10-CM | POA: Diagnosis not present

## 2020-12-15 DIAGNOSIS — I10 Essential (primary) hypertension: Secondary | ICD-10-CM | POA: Diagnosis not present

## 2020-12-15 DIAGNOSIS — E119 Type 2 diabetes mellitus without complications: Secondary | ICD-10-CM | POA: Diagnosis not present

## 2020-12-15 DIAGNOSIS — R2681 Unsteadiness on feet: Secondary | ICD-10-CM | POA: Diagnosis not present

## 2020-12-15 DIAGNOSIS — Z741 Need for assistance with personal care: Secondary | ICD-10-CM | POA: Diagnosis not present

## 2020-12-15 DIAGNOSIS — Z9181 History of falling: Secondary | ICD-10-CM | POA: Diagnosis not present

## 2020-12-17 DIAGNOSIS — R55 Syncope and collapse: Secondary | ICD-10-CM | POA: Diagnosis not present

## 2020-12-17 DIAGNOSIS — Z9181 History of falling: Secondary | ICD-10-CM | POA: Diagnosis not present

## 2020-12-17 DIAGNOSIS — N3000 Acute cystitis without hematuria: Secondary | ICD-10-CM | POA: Diagnosis not present

## 2020-12-17 DIAGNOSIS — I509 Heart failure, unspecified: Secondary | ICD-10-CM | POA: Diagnosis not present

## 2020-12-17 DIAGNOSIS — N182 Chronic kidney disease, stage 2 (mild): Secondary | ICD-10-CM | POA: Diagnosis not present

## 2020-12-17 DIAGNOSIS — G8929 Other chronic pain: Secondary | ICD-10-CM | POA: Diagnosis not present

## 2020-12-17 DIAGNOSIS — I10 Essential (primary) hypertension: Secondary | ICD-10-CM | POA: Diagnosis not present

## 2020-12-17 DIAGNOSIS — M6281 Muscle weakness (generalized): Secondary | ICD-10-CM | POA: Diagnosis not present

## 2020-12-17 DIAGNOSIS — E119 Type 2 diabetes mellitus without complications: Secondary | ICD-10-CM | POA: Diagnosis not present

## 2020-12-17 DIAGNOSIS — Z741 Need for assistance with personal care: Secondary | ICD-10-CM | POA: Diagnosis not present

## 2020-12-17 DIAGNOSIS — R2681 Unsteadiness on feet: Secondary | ICD-10-CM | POA: Diagnosis not present

## 2020-12-18 DIAGNOSIS — N3000 Acute cystitis without hematuria: Secondary | ICD-10-CM | POA: Diagnosis not present

## 2020-12-18 DIAGNOSIS — I10 Essential (primary) hypertension: Secondary | ICD-10-CM | POA: Diagnosis not present

## 2020-12-18 DIAGNOSIS — Z9181 History of falling: Secondary | ICD-10-CM | POA: Diagnosis not present

## 2020-12-18 DIAGNOSIS — M6281 Muscle weakness (generalized): Secondary | ICD-10-CM | POA: Diagnosis not present

## 2020-12-18 DIAGNOSIS — R55 Syncope and collapse: Secondary | ICD-10-CM | POA: Diagnosis not present

## 2020-12-18 DIAGNOSIS — E119 Type 2 diabetes mellitus without complications: Secondary | ICD-10-CM | POA: Diagnosis not present

## 2020-12-18 DIAGNOSIS — N182 Chronic kidney disease, stage 2 (mild): Secondary | ICD-10-CM | POA: Diagnosis not present

## 2020-12-18 DIAGNOSIS — G8929 Other chronic pain: Secondary | ICD-10-CM | POA: Diagnosis not present

## 2020-12-18 DIAGNOSIS — R2681 Unsteadiness on feet: Secondary | ICD-10-CM | POA: Diagnosis not present

## 2020-12-18 DIAGNOSIS — I509 Heart failure, unspecified: Secondary | ICD-10-CM | POA: Diagnosis not present

## 2020-12-18 DIAGNOSIS — Z741 Need for assistance with personal care: Secondary | ICD-10-CM | POA: Diagnosis not present

## 2020-12-19 DIAGNOSIS — G8929 Other chronic pain: Secondary | ICD-10-CM | POA: Diagnosis not present

## 2020-12-19 DIAGNOSIS — R55 Syncope and collapse: Secondary | ICD-10-CM | POA: Diagnosis not present

## 2020-12-19 DIAGNOSIS — Z741 Need for assistance with personal care: Secondary | ICD-10-CM | POA: Diagnosis not present

## 2020-12-19 DIAGNOSIS — N3000 Acute cystitis without hematuria: Secondary | ICD-10-CM | POA: Diagnosis not present

## 2020-12-19 DIAGNOSIS — E119 Type 2 diabetes mellitus without complications: Secondary | ICD-10-CM | POA: Diagnosis not present

## 2020-12-19 DIAGNOSIS — I10 Essential (primary) hypertension: Secondary | ICD-10-CM | POA: Diagnosis not present

## 2020-12-19 DIAGNOSIS — Z9181 History of falling: Secondary | ICD-10-CM | POA: Diagnosis not present

## 2020-12-19 DIAGNOSIS — M6281 Muscle weakness (generalized): Secondary | ICD-10-CM | POA: Diagnosis not present

## 2020-12-19 DIAGNOSIS — R2681 Unsteadiness on feet: Secondary | ICD-10-CM | POA: Diagnosis not present

## 2020-12-19 DIAGNOSIS — I509 Heart failure, unspecified: Secondary | ICD-10-CM | POA: Diagnosis not present

## 2020-12-19 DIAGNOSIS — N182 Chronic kidney disease, stage 2 (mild): Secondary | ICD-10-CM | POA: Diagnosis not present

## 2020-12-20 DIAGNOSIS — I10 Essential (primary) hypertension: Secondary | ICD-10-CM | POA: Diagnosis not present

## 2020-12-20 DIAGNOSIS — R2681 Unsteadiness on feet: Secondary | ICD-10-CM | POA: Diagnosis not present

## 2020-12-20 DIAGNOSIS — M6281 Muscle weakness (generalized): Secondary | ICD-10-CM | POA: Diagnosis not present

## 2020-12-20 DIAGNOSIS — M4316 Spondylolisthesis, lumbar region: Secondary | ICD-10-CM | POA: Diagnosis not present

## 2020-12-20 DIAGNOSIS — G8929 Other chronic pain: Secondary | ICD-10-CM | POA: Diagnosis not present

## 2020-12-20 DIAGNOSIS — E119 Type 2 diabetes mellitus without complications: Secondary | ICD-10-CM | POA: Diagnosis not present

## 2020-12-20 DIAGNOSIS — M5416 Radiculopathy, lumbar region: Secondary | ICD-10-CM | POA: Diagnosis not present

## 2020-12-20 DIAGNOSIS — R55 Syncope and collapse: Secondary | ICD-10-CM | POA: Diagnosis not present

## 2020-12-20 DIAGNOSIS — I509 Heart failure, unspecified: Secondary | ICD-10-CM | POA: Diagnosis not present

## 2020-12-20 DIAGNOSIS — Z9181 History of falling: Secondary | ICD-10-CM | POA: Diagnosis not present

## 2020-12-20 DIAGNOSIS — N3000 Acute cystitis without hematuria: Secondary | ICD-10-CM | POA: Diagnosis not present

## 2020-12-20 DIAGNOSIS — Z741 Need for assistance with personal care: Secondary | ICD-10-CM | POA: Diagnosis not present

## 2020-12-20 DIAGNOSIS — N182 Chronic kidney disease, stage 2 (mild): Secondary | ICD-10-CM | POA: Diagnosis not present

## 2020-12-21 DIAGNOSIS — I5032 Chronic diastolic (congestive) heart failure: Secondary | ICD-10-CM | POA: Diagnosis not present

## 2020-12-21 DIAGNOSIS — R2681 Unsteadiness on feet: Secondary | ICD-10-CM | POA: Diagnosis not present

## 2020-12-21 DIAGNOSIS — S42101D Fracture of unspecified part of scapula, right shoulder, subsequent encounter for fracture with routine healing: Secondary | ICD-10-CM | POA: Diagnosis not present

## 2020-12-21 DIAGNOSIS — F32A Depression, unspecified: Secondary | ICD-10-CM | POA: Diagnosis not present

## 2020-12-21 DIAGNOSIS — Z9181 History of falling: Secondary | ICD-10-CM | POA: Diagnosis not present

## 2020-12-21 DIAGNOSIS — Z741 Need for assistance with personal care: Secondary | ICD-10-CM | POA: Diagnosis not present

## 2020-12-21 DIAGNOSIS — I509 Heart failure, unspecified: Secondary | ICD-10-CM | POA: Diagnosis not present

## 2020-12-21 DIAGNOSIS — G8929 Other chronic pain: Secondary | ICD-10-CM | POA: Diagnosis not present

## 2020-12-21 DIAGNOSIS — I10 Essential (primary) hypertension: Secondary | ICD-10-CM | POA: Diagnosis not present

## 2020-12-21 DIAGNOSIS — N182 Chronic kidney disease, stage 2 (mild): Secondary | ICD-10-CM | POA: Diagnosis not present

## 2020-12-21 DIAGNOSIS — R55 Syncope and collapse: Secondary | ICD-10-CM | POA: Diagnosis not present

## 2020-12-21 DIAGNOSIS — N3000 Acute cystitis without hematuria: Secondary | ICD-10-CM | POA: Diagnosis not present

## 2020-12-21 DIAGNOSIS — M6281 Muscle weakness (generalized): Secondary | ICD-10-CM | POA: Diagnosis not present

## 2020-12-21 DIAGNOSIS — E119 Type 2 diabetes mellitus without complications: Secondary | ICD-10-CM | POA: Diagnosis not present

## 2020-12-22 DIAGNOSIS — Z9181 History of falling: Secondary | ICD-10-CM | POA: Diagnosis not present

## 2020-12-22 DIAGNOSIS — N182 Chronic kidney disease, stage 2 (mild): Secondary | ICD-10-CM | POA: Diagnosis not present

## 2020-12-22 DIAGNOSIS — I509 Heart failure, unspecified: Secondary | ICD-10-CM | POA: Diagnosis not present

## 2020-12-22 DIAGNOSIS — Z741 Need for assistance with personal care: Secondary | ICD-10-CM | POA: Diagnosis not present

## 2020-12-22 DIAGNOSIS — N3000 Acute cystitis without hematuria: Secondary | ICD-10-CM | POA: Diagnosis not present

## 2020-12-22 DIAGNOSIS — R55 Syncope and collapse: Secondary | ICD-10-CM | POA: Diagnosis not present

## 2020-12-22 DIAGNOSIS — E119 Type 2 diabetes mellitus without complications: Secondary | ICD-10-CM | POA: Diagnosis not present

## 2020-12-22 DIAGNOSIS — G8929 Other chronic pain: Secondary | ICD-10-CM | POA: Diagnosis not present

## 2020-12-22 DIAGNOSIS — R2681 Unsteadiness on feet: Secondary | ICD-10-CM | POA: Diagnosis not present

## 2020-12-22 DIAGNOSIS — I10 Essential (primary) hypertension: Secondary | ICD-10-CM | POA: Diagnosis not present

## 2020-12-22 DIAGNOSIS — M6281 Muscle weakness (generalized): Secondary | ICD-10-CM | POA: Diagnosis not present

## 2020-12-25 DIAGNOSIS — N3000 Acute cystitis without hematuria: Secondary | ICD-10-CM | POA: Diagnosis not present

## 2020-12-25 DIAGNOSIS — M6281 Muscle weakness (generalized): Secondary | ICD-10-CM | POA: Diagnosis not present

## 2020-12-25 DIAGNOSIS — E119 Type 2 diabetes mellitus without complications: Secondary | ICD-10-CM | POA: Diagnosis not present

## 2020-12-25 DIAGNOSIS — R55 Syncope and collapse: Secondary | ICD-10-CM | POA: Diagnosis not present

## 2020-12-25 DIAGNOSIS — N182 Chronic kidney disease, stage 2 (mild): Secondary | ICD-10-CM | POA: Diagnosis not present

## 2020-12-25 DIAGNOSIS — Z9181 History of falling: Secondary | ICD-10-CM | POA: Diagnosis not present

## 2020-12-25 DIAGNOSIS — R2681 Unsteadiness on feet: Secondary | ICD-10-CM | POA: Diagnosis not present

## 2020-12-25 DIAGNOSIS — I509 Heart failure, unspecified: Secondary | ICD-10-CM | POA: Diagnosis not present

## 2020-12-25 DIAGNOSIS — Z741 Need for assistance with personal care: Secondary | ICD-10-CM | POA: Diagnosis not present

## 2020-12-25 DIAGNOSIS — G8929 Other chronic pain: Secondary | ICD-10-CM | POA: Diagnosis not present

## 2020-12-25 DIAGNOSIS — I10 Essential (primary) hypertension: Secondary | ICD-10-CM | POA: Diagnosis not present

## 2020-12-26 ENCOUNTER — Ambulatory Visit (HOSPITAL_COMMUNITY)
Admission: RE | Admit: 2020-12-26 | Discharge: 2020-12-26 | Disposition: A | Discharge status: Home / Self Care | Payer: Medicare Other | Source: Ambulatory Visit | Attending: Physician Assistant | Admitting: Physician Assistant

## 2020-12-26 ENCOUNTER — Ambulatory Visit (INDEPENDENT_AMBULATORY_CARE_PROVIDER_SITE_OTHER): Payer: Medicare Other | Admitting: Physician Assistant

## 2020-12-26 ENCOUNTER — Encounter: Payer: Self-pay | Admitting: Physician Assistant

## 2020-12-26 ENCOUNTER — Other Ambulatory Visit: Payer: Self-pay

## 2020-12-26 VITALS — BP 165/66 | HR 59 | Temp 97.7°F | Resp 20 | Ht 65.0 in | Wt 165.0 lb

## 2020-12-26 DIAGNOSIS — Z9181 History of falling: Secondary | ICD-10-CM | POA: Diagnosis not present

## 2020-12-26 DIAGNOSIS — N182 Chronic kidney disease, stage 2 (mild): Secondary | ICD-10-CM | POA: Diagnosis not present

## 2020-12-26 DIAGNOSIS — I5032 Chronic diastolic (congestive) heart failure: Secondary | ICD-10-CM | POA: Diagnosis not present

## 2020-12-26 DIAGNOSIS — N3000 Acute cystitis without hematuria: Secondary | ICD-10-CM | POA: Diagnosis not present

## 2020-12-26 DIAGNOSIS — I509 Heart failure, unspecified: Secondary | ICD-10-CM | POA: Diagnosis not present

## 2020-12-26 DIAGNOSIS — Z741 Need for assistance with personal care: Secondary | ICD-10-CM | POA: Diagnosis not present

## 2020-12-26 DIAGNOSIS — R2681 Unsteadiness on feet: Secondary | ICD-10-CM | POA: Diagnosis not present

## 2020-12-26 DIAGNOSIS — G8929 Other chronic pain: Secondary | ICD-10-CM | POA: Diagnosis not present

## 2020-12-26 DIAGNOSIS — I6523 Occlusion and stenosis of bilateral carotid arteries: Secondary | ICD-10-CM | POA: Diagnosis not present

## 2020-12-26 DIAGNOSIS — F32A Depression, unspecified: Secondary | ICD-10-CM | POA: Diagnosis not present

## 2020-12-26 DIAGNOSIS — M6281 Muscle weakness (generalized): Secondary | ICD-10-CM | POA: Diagnosis not present

## 2020-12-26 DIAGNOSIS — G629 Polyneuropathy, unspecified: Secondary | ICD-10-CM | POA: Diagnosis not present

## 2020-12-26 DIAGNOSIS — E119 Type 2 diabetes mellitus without complications: Secondary | ICD-10-CM | POA: Diagnosis not present

## 2020-12-26 DIAGNOSIS — I10 Essential (primary) hypertension: Secondary | ICD-10-CM | POA: Diagnosis not present

## 2020-12-26 DIAGNOSIS — R55 Syncope and collapse: Secondary | ICD-10-CM | POA: Diagnosis not present

## 2020-12-26 DIAGNOSIS — M549 Dorsalgia, unspecified: Secondary | ICD-10-CM | POA: Diagnosis not present

## 2020-12-26 NOTE — Progress Notes (Signed)
HISTORY AND PHYSICAL     CC:  follow up. Requesting Provider:  Aura Dials, MD  HPI: This is a 80 y.o. female here for follow up for carotid artery stenosis.  Pt is s/p left CEA for asymptomatic carotid artery stenosis on 04/16/2013 and right CEA also for asymptomatic carotid artery stenosis on 03/22/2016 by Dr. Donnetta Hutching.    Pt was last seen December 2021 and at that time she was doing well and without stroke sx.she had 1-39% right and 40-59% left ICA stenosis. She was to continue statin/asa/plavix and f/u in one year.   Pt returns today for follow up.  She states she now resides at [facility] bc she could no longer take care of herself.  She states that she does daily therapy.  She fell last month and had a cut above her eye and right shoulder injury that did not require surgery.    Pt denies any amaurosis fugax, speech difficulties, weakness, numbness, paralysis or clumsiness or facial droop.    The pt is on a statin for cholesterol management.  The pt is on a daily aspirin.   Other AC:  Brilinta The pt is on BB and diuretic for hypertension.   The pt is diabetic.   Tobacco hx:  never    Past Medical History:  Diagnosis Date   Acute kidney injury ([place]) 08/05/2016   Aftercare following surgery of the circulatory system, NEC 05/11/2013   AKI (acute kidney injury) ([place]) 08/04/2016   Anxiety    takes Celexa daily   ARF (acute renal failure) ([place]) 11/07/2016   Asymptomatic stenosis of right carotid artery 03/22/2016   Back pain    occasionally   Carotid artery disease ([place]) 04/16/2013   Carotid artery occlusion    Carotid stenosis 11/16/2013   Cataract    left and immature   Coronary artery disease    Coronary atherosclerosis of native coronary artery 03/11/2013   S/p CABG in 1997    Depression    Diabetes mellitus    takes Metformin and Glipizide daily   Diabetes mellitus ([place]) 05/02/2015   Dizziness    takes Meclizine daily as needed   Essential hypertension, benign 03/11/2013    GERD (gastroesophageal reflux disease)    takes Omeprazole daily as needed   Headache(784.0)    Hyperlipidemia    takes Atorvastatin daily   Hypertension    takes Carvedilol daily   Mixed hyperlipidemia 03/11/2013   Muscle spasm    takes Robaxin daily as needed   Nausea    takes Phenergan daily as needed   Occlusion and stenosis of carotid artery without mention of cerebral infarction 07/19/2011   Pneumonia    hx of-in high school   Restless leg    takes Requip daily as needed   Seasonal allergies    takes Claritin daily as needed and Afrin as needed   Shortness of breath    with exertion   Urinary urgency    UTI (urinary tract infection) 11/07/2016    Past Surgical History:  Procedure Laterality Date   COLONOSCOPY WITH PROPOFOL N/A 03/10/2012   Procedure: COLONOSCOPY WITH PROPOFOL;  Surgeon: Garlan Fair, MD;  Location: [facility];  Service: Endoscopy;  Laterality: N/A;   CORNEAL TRANSPLANT Right    CORONARY ARTERY BYPASS GRAFT  1997   x 6   CORONARY ARTERY BYPASS GRAFT  Jan. 1997   ENDARTERECTOMY Left 04/16/2013   Procedure: Left Carotid Artery Endatarectomy with Resection of Redundant Internal Carotid Artery;  Surgeon: Rosetta Posner, MD;  Location: [facility];  Service: Vascular;  Laterality: Left;   ENDARTERECTOMY Right 03/22/2016   Procedure: RIGHT ENDARTERECTOMY CAROTID;  Surgeon: Rosetta Posner, MD;  Location[facility] OR;  Service: Vascular;  Laterality: Right;   ESOPHAGOGASTRODUODENOSCOPY N/A 03/10/2012   Procedure: ESOPHAGOGASTRODUODENOSCOPY (EGD);  Surgeon: Garlan Fair, MD;  Location: [facility];  Service: Endoscopy;  Laterality: N/A;   EYE SURGERY  March 12, 2001   CORNEA TRANSPLANT Right eye   PATCH ANGIOPLASTY Right 03/22/2016   Procedure: PATCH ANGIOPLASTY;  Surgeon: Rosetta Posner, MD;  Location: [place] [facility];  Service: Vascular;  Laterality: Right;   PR VEIN BYPASS GRAFT,AORTO-FEM-POP  1997   SPINE SURGERY  march 2013   Back surgery   TONSILLECTOMY     TRIGGER FINGER  RELEASE Left    thumb    Allergies  Allergen Reactions   Codeine Other (See Comments)    Abnormal behavior    Latex Rash and Other (See Comments)    tears skin    Morphine And Related Other (See Comments)    Affects BP and blood sugar.   Cyclobenzaprine     MADE PT SICK- pt unsure if med was cyclobenzaprine or methocarbamol    Methocarbamol Other (See Comments)    MADE PT SICK- pt unsure if med was cyclobenzaprine or methocarbamol  11/09/20 pt currently takes methocarbamol   Morphine Other (See Comments)    Unknown reaction    Current Outpatient Medications  Medication Sig Dispense Refill   acetaminophen (TYLENOL) 325 MG tablet Take 650 mg by mouth every 6 (six) hours as needed for moderate pain.     Ascorbic Acid (VITAMIN C) 1000 MG tablet Take 1,000 mg by mouth daily.     aspirin EC 81 MG tablet Take 81 mg by mouth every evening.      busPIRone (BUSPAR) 7.5 MG tablet Take 7.5 mg by mouth 2 (two) times daily.     carvedilol (COREG) 12.5 MG tablet Take 1 tablet (12.5 mg total) by mouth 2 (two) times daily. 60 tablet 2   citalopram (CELEXA) 20 MG tablet Take 20 mg by mouth every morning.      Cyanocobalamin (B-12) 1000 MCG TABS Take 1,000 mcg by mouth daily.     ferrous sulfate 325 (65 FE) MG tablet Take 325 mg by mouth daily.     furosemide (LASIX) 20 MG tablet Take 20 mg by mouth daily as needed for edema.     gabapentin (NEURONTIN) 300 MG capsule Take 300 mg by mouth 4 (four) times daily.      glipiZIDE (GLUCOTROL XL) 5 MG 24 hr tablet Take 1 tablet (5 mg total) by mouth daily with breakfast. 30 tablet 0   HYDROcodone-acetaminophen (NORCO) 10-325 MG tablet Take 1 tablet by mouth 2 (two) times daily as needed for severe pain. 30 tablet 0   lidocaine (LIDODERM) 5 % Place 1 patch onto the skin daily. Apply to back and left leg for 12 hours per day for pain. Remove every morning.     Magnesium Oxide 250 MG TABS Take 250 mg by mouth daily.     meclizine (ANTIVERT) 25 MG tablet Take  25 mg by mouth 2 (two) times daily as needed for dizziness. For dizziness     melatonin 3 MG TABS tablet Take 3 mg by mouth at bedtime.     methocarbamol (ROBAXIN) 500 MG tablet Take 500 mg by mouth 3 (three) times daily.     nitroGLYCERIN (  NITROSTAT) 0.4 MG SL tablet Place 1 tablet (0.4 mg total) under the tongue every 5 (five) minutes as needed. Chest pain. 25 tablet 5   nystatin (MYCOSTATIN/NYSTOP) powder Apply 1 application topically 2 (two) times daily.     oxybutynin (DITROPAN-XL) 5 MG 24 hr tablet Take 5 mg by mouth daily.     Polyethyl Glycol-Propyl Glycol (SYSTANE) 0.4-0.3 % SOLN Apply 1-2 drops to eye 4 (four) times daily as needed (dry eye).     rosuvastatin (CRESTOR) 5 MG tablet Take 5 mg by mouth daily. Takes on Monday and Friday     ticagrelor (BRILINTA) 90 MG TABS tablet Take 1 tablet (90 mg total) by mouth 2 (two) times daily. 60 tablet 0   No current facility-administered medications for this visit.    Family History  Problem Relation Age of Onset   Heart attack Father    Heart disease Father 45       Before age of 2   Hyperlipidemia Father    Heart disease Brother        Heart dissease before age 75   Hyperlipidemia Brother    Cirrhosis Mother    Heart attack Paternal Grandfather    Heart disease Paternal Grandfather    Cancer Maternal Grandmother    Alcoholism Maternal Grandfather    Heart attack Paternal Grandmother     Social History   Socioeconomic History   Marital status: Widowed    Spouse name: Not on file   Number of children: Not on file   Years of education: Not on file   Highest education level: Not on file  Occupational History   Not on file  Tobacco Use   Smoking status: Never   Smokeless tobacco: Never  Vaping Use   Vaping Use: Never used  Substance and Sexual Activity   Alcohol use: No    Alcohol/week: 0.0 standard drinks   Drug use: No   Sexual activity: Never    Birth control/protection: Post-menopausal  Other Topics Concern    Not on file  Social History Narrative   Not on file   Social Determinants of Health   Financial Resource Strain: Not on file  Food Insecurity: Not on file  Transportation Needs: Not on file  Physical Activity: Not on file  Stress: Not on file  Social Connections: Not on file  Intimate Partner Violence: Not on file     REVIEW OF SYSTEMS:   [X]  denotes positive finding, [ ]  denotes negative finding Cardiac  Comments:  Chest pain or chest pressure:    Shortness of breath upon exertion:    Short of breath when lying flat:    Irregular heart rhythm:        Vascular    Pain in calf, thigh, or hip brought on by ambulation:    Pain in feet at night that wakes you up from your sleep:     Blood clot in your veins:    Leg swelling:         Pulmonary    Oxygen at home:    Productive cough:     Wheezing:         Neurologic    Sudden weakness in arms or legs:     Sudden numbness in arms or legs:     Sudden onset of difficulty speaking or slurred speech:    Temporary loss of vision in one eye:     Problems with dizziness:         Gastrointestinal  Blood in stool:     Vomited blood:         Genitourinary    Burning when urinating:     Blood in urine:        Psychiatric    Major depression:         Hematologic    Bleeding problems:    Problems with blood clotting too easily:        Skin    Rashes or ulcers:        Constitutional    Fever or chills:      PHYSICAL EXAMINATION:  Today's Vitals   12/26/20 1337 12/26/20 1340  BP: (!) 142/69 (!) 165/66  Pulse: (!) 59   Resp: 20   Temp: 97.7 F (36.5 C)   SpO2: 93%   Weight: 165 lb (74.8 kg)   Height: 5\' 5"  (1.651 m)    Body mass index is 27.46 kg/m.   General:  WDWN in NAD; vital signs documented above Gait: Not observed HENT: WNL, normocephalic Pulmonary: normal non-labored breathing Cardiac: regular HR, with left carotid bruit Abdomen: obese Skin: without rashes Vascular Exam/Pulses:  Right Left   Radial 2+ (normal) 2+ (normal)  AT 2+ (normal) 2+ (normal)   Extremities: without ischemic changes, without Gangrene , without cellulitis; without open wounds Musculoskeletal: no muscle wasting or atrophy  Neurologic: A&O X 3; moving all extremities equally; speech is fluent/normal Psychiatric:  The pt has Normal affect.   Non-Invasive Vascular Imaging:   Carotid Duplex on 12/26/2020: Right:  1-39% ICA stenosis Left:  1-39% ICA stenosis (high end) Vertebrals:  Bilateral vertebral arteries demonstrate antegrade flow.  Subclavians: Normal flow hemodynamics were seen in bilateral subclavian arteries.  Previous Carotid duplex on 12/15/2019: Right: 1-39% ICA stenosis Left:   40-59% ICA stenosis    ASSESSMENT/PLAN:: 80 y.o. female here for follow up carotid artery stenosis and is s/p s/p left CEA for asymptomatic carotid artery stenosis on 04/16/2013 and right CEA also for asymptomatic carotid artery stenosis on 03/22/2016 by Dr. Donnetta Hutching.    -duplex today reveals 1-39% on the right and high end 1-39% on the left (40-59% on the left last visit) and she remains asymptomatic -discussed s/s of stroke with pt and she understands should she develop any of these sx, she will go to the nearest ER or call 911. -pt will f/u in one year with carotid duplex -pt will call sooner should they have any issues. -continue statin/asa from vascular standpoint   Leontine Locket, [facility] [phone]  Clinic MD:  Scot Dock on call MD

## 2020-12-27 DIAGNOSIS — N182 Chronic kidney disease, stage 2 (mild): Secondary | ICD-10-CM | POA: Diagnosis not present

## 2020-12-27 DIAGNOSIS — I10 Essential (primary) hypertension: Secondary | ICD-10-CM | POA: Diagnosis not present

## 2020-12-27 DIAGNOSIS — E119 Type 2 diabetes mellitus without complications: Secondary | ICD-10-CM | POA: Diagnosis not present

## 2020-12-27 DIAGNOSIS — M6281 Muscle weakness (generalized): Secondary | ICD-10-CM | POA: Diagnosis not present

## 2020-12-27 DIAGNOSIS — Z741 Need for assistance with personal care: Secondary | ICD-10-CM | POA: Diagnosis not present

## 2020-12-27 DIAGNOSIS — G8929 Other chronic pain: Secondary | ICD-10-CM | POA: Diagnosis not present

## 2020-12-27 DIAGNOSIS — N3000 Acute cystitis without hematuria: Secondary | ICD-10-CM | POA: Diagnosis not present

## 2020-12-27 DIAGNOSIS — Z9181 History of falling: Secondary | ICD-10-CM | POA: Diagnosis not present

## 2020-12-27 DIAGNOSIS — R55 Syncope and collapse: Secondary | ICD-10-CM | POA: Diagnosis not present

## 2020-12-27 DIAGNOSIS — R2681 Unsteadiness on feet: Secondary | ICD-10-CM | POA: Diagnosis not present

## 2020-12-27 DIAGNOSIS — I509 Heart failure, unspecified: Secondary | ICD-10-CM | POA: Diagnosis not present

## 2020-12-28 DIAGNOSIS — M6281 Muscle weakness (generalized): Secondary | ICD-10-CM | POA: Diagnosis not present

## 2020-12-28 DIAGNOSIS — R2681 Unsteadiness on feet: Secondary | ICD-10-CM | POA: Diagnosis not present

## 2020-12-28 DIAGNOSIS — Z741 Need for assistance with personal care: Secondary | ICD-10-CM | POA: Diagnosis not present

## 2020-12-28 DIAGNOSIS — R55 Syncope and collapse: Secondary | ICD-10-CM | POA: Diagnosis not present

## 2020-12-28 DIAGNOSIS — Z9181 History of falling: Secondary | ICD-10-CM | POA: Diagnosis not present

## 2020-12-28 DIAGNOSIS — I509 Heart failure, unspecified: Secondary | ICD-10-CM | POA: Diagnosis not present

## 2020-12-28 DIAGNOSIS — E119 Type 2 diabetes mellitus without complications: Secondary | ICD-10-CM | POA: Diagnosis not present

## 2020-12-28 DIAGNOSIS — N3000 Acute cystitis without hematuria: Secondary | ICD-10-CM | POA: Diagnosis not present

## 2020-12-28 DIAGNOSIS — N182 Chronic kidney disease, stage 2 (mild): Secondary | ICD-10-CM | POA: Diagnosis not present

## 2020-12-28 DIAGNOSIS — I10 Essential (primary) hypertension: Secondary | ICD-10-CM | POA: Diagnosis not present

## 2020-12-28 DIAGNOSIS — G8929 Other chronic pain: Secondary | ICD-10-CM | POA: Diagnosis not present

## 2020-12-29 DIAGNOSIS — N3000 Acute cystitis without hematuria: Secondary | ICD-10-CM | POA: Diagnosis not present

## 2020-12-29 DIAGNOSIS — N182 Chronic kidney disease, stage 2 (mild): Secondary | ICD-10-CM | POA: Diagnosis not present

## 2020-12-29 DIAGNOSIS — I509 Heart failure, unspecified: Secondary | ICD-10-CM | POA: Diagnosis not present

## 2020-12-29 DIAGNOSIS — R2681 Unsteadiness on feet: Secondary | ICD-10-CM | POA: Diagnosis not present

## 2020-12-29 DIAGNOSIS — I10 Essential (primary) hypertension: Secondary | ICD-10-CM | POA: Diagnosis not present

## 2020-12-29 DIAGNOSIS — Z9181 History of falling: Secondary | ICD-10-CM | POA: Diagnosis not present

## 2020-12-29 DIAGNOSIS — E119 Type 2 diabetes mellitus without complications: Secondary | ICD-10-CM | POA: Diagnosis not present

## 2020-12-29 DIAGNOSIS — G8929 Other chronic pain: Secondary | ICD-10-CM | POA: Diagnosis not present

## 2020-12-29 DIAGNOSIS — Z741 Need for assistance with personal care: Secondary | ICD-10-CM | POA: Diagnosis not present

## 2020-12-29 DIAGNOSIS — M6281 Muscle weakness (generalized): Secondary | ICD-10-CM | POA: Diagnosis not present

## 2020-12-29 DIAGNOSIS — R55 Syncope and collapse: Secondary | ICD-10-CM | POA: Diagnosis not present

## 2020-12-30 DIAGNOSIS — E119 Type 2 diabetes mellitus without complications: Secondary | ICD-10-CM | POA: Diagnosis not present

## 2020-12-30 DIAGNOSIS — G8929 Other chronic pain: Secondary | ICD-10-CM | POA: Diagnosis not present

## 2020-12-30 DIAGNOSIS — N182 Chronic kidney disease, stage 2 (mild): Secondary | ICD-10-CM | POA: Diagnosis not present

## 2020-12-30 DIAGNOSIS — R2681 Unsteadiness on feet: Secondary | ICD-10-CM | POA: Diagnosis not present

## 2020-12-30 DIAGNOSIS — R55 Syncope and collapse: Secondary | ICD-10-CM | POA: Diagnosis not present

## 2020-12-30 DIAGNOSIS — M6281 Muscle weakness (generalized): Secondary | ICD-10-CM | POA: Diagnosis not present

## 2020-12-30 DIAGNOSIS — I10 Essential (primary) hypertension: Secondary | ICD-10-CM | POA: Diagnosis not present

## 2020-12-30 DIAGNOSIS — Z9181 History of falling: Secondary | ICD-10-CM | POA: Diagnosis not present

## 2020-12-30 DIAGNOSIS — I509 Heart failure, unspecified: Secondary | ICD-10-CM | POA: Diagnosis not present

## 2020-12-30 DIAGNOSIS — N3000 Acute cystitis without hematuria: Secondary | ICD-10-CM | POA: Diagnosis not present

## 2020-12-30 DIAGNOSIS — Z741 Need for assistance with personal care: Secondary | ICD-10-CM | POA: Diagnosis not present

## 2021-01-01 DIAGNOSIS — M6281 Muscle weakness (generalized): Secondary | ICD-10-CM | POA: Diagnosis not present

## 2021-01-01 DIAGNOSIS — I509 Heart failure, unspecified: Secondary | ICD-10-CM | POA: Diagnosis not present

## 2021-01-01 DIAGNOSIS — N182 Chronic kidney disease, stage 2 (mild): Secondary | ICD-10-CM | POA: Diagnosis not present

## 2021-01-01 DIAGNOSIS — R55 Syncope and collapse: Secondary | ICD-10-CM | POA: Diagnosis not present

## 2021-01-01 DIAGNOSIS — Z9181 History of falling: Secondary | ICD-10-CM | POA: Diagnosis not present

## 2021-01-01 DIAGNOSIS — E119 Type 2 diabetes mellitus without complications: Secondary | ICD-10-CM | POA: Diagnosis not present

## 2021-01-01 DIAGNOSIS — G8929 Other chronic pain: Secondary | ICD-10-CM | POA: Diagnosis not present

## 2021-01-01 DIAGNOSIS — R2681 Unsteadiness on feet: Secondary | ICD-10-CM | POA: Diagnosis not present

## 2021-01-01 DIAGNOSIS — Z741 Need for assistance with personal care: Secondary | ICD-10-CM | POA: Diagnosis not present

## 2021-01-01 DIAGNOSIS — N3000 Acute cystitis without hematuria: Secondary | ICD-10-CM | POA: Diagnosis not present

## 2021-01-01 DIAGNOSIS — I10 Essential (primary) hypertension: Secondary | ICD-10-CM | POA: Diagnosis not present

## 2021-01-02 DIAGNOSIS — R2681 Unsteadiness on feet: Secondary | ICD-10-CM | POA: Diagnosis not present

## 2021-01-02 DIAGNOSIS — E119 Type 2 diabetes mellitus without complications: Secondary | ICD-10-CM | POA: Diagnosis not present

## 2021-01-02 DIAGNOSIS — Z9181 History of falling: Secondary | ICD-10-CM | POA: Diagnosis not present

## 2021-01-02 DIAGNOSIS — M6281 Muscle weakness (generalized): Secondary | ICD-10-CM | POA: Diagnosis not present

## 2021-01-02 DIAGNOSIS — N3000 Acute cystitis without hematuria: Secondary | ICD-10-CM | POA: Diagnosis not present

## 2021-01-02 DIAGNOSIS — G8929 Other chronic pain: Secondary | ICD-10-CM | POA: Diagnosis not present

## 2021-01-02 DIAGNOSIS — R55 Syncope and collapse: Secondary | ICD-10-CM | POA: Diagnosis not present

## 2021-01-02 DIAGNOSIS — I10 Essential (primary) hypertension: Secondary | ICD-10-CM | POA: Diagnosis not present

## 2021-01-02 DIAGNOSIS — Z741 Need for assistance with personal care: Secondary | ICD-10-CM | POA: Diagnosis not present

## 2021-01-02 DIAGNOSIS — I509 Heart failure, unspecified: Secondary | ICD-10-CM | POA: Diagnosis not present

## 2021-01-02 DIAGNOSIS — N182 Chronic kidney disease, stage 2 (mild): Secondary | ICD-10-CM | POA: Diagnosis not present

## 2021-01-03 DIAGNOSIS — R55 Syncope and collapse: Secondary | ICD-10-CM | POA: Diagnosis not present

## 2021-01-03 DIAGNOSIS — N3000 Acute cystitis without hematuria: Secondary | ICD-10-CM | POA: Diagnosis not present

## 2021-01-03 DIAGNOSIS — G8929 Other chronic pain: Secondary | ICD-10-CM | POA: Diagnosis not present

## 2021-01-03 DIAGNOSIS — Z9181 History of falling: Secondary | ICD-10-CM | POA: Diagnosis not present

## 2021-01-03 DIAGNOSIS — I10 Essential (primary) hypertension: Secondary | ICD-10-CM | POA: Diagnosis not present

## 2021-01-03 DIAGNOSIS — N182 Chronic kidney disease, stage 2 (mild): Secondary | ICD-10-CM | POA: Diagnosis not present

## 2021-01-03 DIAGNOSIS — E119 Type 2 diabetes mellitus without complications: Secondary | ICD-10-CM | POA: Diagnosis not present

## 2021-01-03 DIAGNOSIS — Z741 Need for assistance with personal care: Secondary | ICD-10-CM | POA: Diagnosis not present

## 2021-01-03 DIAGNOSIS — R2681 Unsteadiness on feet: Secondary | ICD-10-CM | POA: Diagnosis not present

## 2021-01-03 DIAGNOSIS — M6281 Muscle weakness (generalized): Secondary | ICD-10-CM | POA: Diagnosis not present

## 2021-01-03 DIAGNOSIS — I509 Heart failure, unspecified: Secondary | ICD-10-CM | POA: Diagnosis not present

## 2021-01-04 DIAGNOSIS — N3000 Acute cystitis without hematuria: Secondary | ICD-10-CM | POA: Diagnosis not present

## 2021-01-04 DIAGNOSIS — I509 Heart failure, unspecified: Secondary | ICD-10-CM | POA: Diagnosis not present

## 2021-01-04 DIAGNOSIS — R2681 Unsteadiness on feet: Secondary | ICD-10-CM | POA: Diagnosis not present

## 2021-01-04 DIAGNOSIS — M6281 Muscle weakness (generalized): Secondary | ICD-10-CM | POA: Diagnosis not present

## 2021-01-04 DIAGNOSIS — Z741 Need for assistance with personal care: Secondary | ICD-10-CM | POA: Diagnosis not present

## 2021-01-04 DIAGNOSIS — Z9181 History of falling: Secondary | ICD-10-CM | POA: Diagnosis not present

## 2021-01-04 DIAGNOSIS — R55 Syncope and collapse: Secondary | ICD-10-CM | POA: Diagnosis not present

## 2021-01-04 DIAGNOSIS — I10 Essential (primary) hypertension: Secondary | ICD-10-CM | POA: Diagnosis not present

## 2021-01-04 DIAGNOSIS — N182 Chronic kidney disease, stage 2 (mild): Secondary | ICD-10-CM | POA: Diagnosis not present

## 2021-01-04 DIAGNOSIS — E119 Type 2 diabetes mellitus without complications: Secondary | ICD-10-CM | POA: Diagnosis not present

## 2021-01-04 DIAGNOSIS — G8929 Other chronic pain: Secondary | ICD-10-CM | POA: Diagnosis not present

## 2021-01-05 DIAGNOSIS — F32A Depression, unspecified: Secondary | ICD-10-CM | POA: Diagnosis not present

## 2021-01-05 DIAGNOSIS — M549 Dorsalgia, unspecified: Secondary | ICD-10-CM | POA: Diagnosis not present

## 2021-01-05 DIAGNOSIS — M6281 Muscle weakness (generalized): Secondary | ICD-10-CM | POA: Diagnosis not present

## 2021-01-05 DIAGNOSIS — G629 Polyneuropathy, unspecified: Secondary | ICD-10-CM | POA: Diagnosis not present

## 2021-01-05 DIAGNOSIS — I509 Heart failure, unspecified: Secondary | ICD-10-CM | POA: Diagnosis not present

## 2021-01-05 DIAGNOSIS — N3000 Acute cystitis without hematuria: Secondary | ICD-10-CM | POA: Diagnosis not present

## 2021-01-05 DIAGNOSIS — Z9181 History of falling: Secondary | ICD-10-CM | POA: Diagnosis not present

## 2021-01-05 DIAGNOSIS — Z741 Need for assistance with personal care: Secondary | ICD-10-CM | POA: Diagnosis not present

## 2021-01-05 DIAGNOSIS — R55 Syncope and collapse: Secondary | ICD-10-CM | POA: Diagnosis not present

## 2021-01-05 DIAGNOSIS — I5032 Chronic diastolic (congestive) heart failure: Secondary | ICD-10-CM | POA: Diagnosis not present

## 2021-01-05 DIAGNOSIS — G8929 Other chronic pain: Secondary | ICD-10-CM | POA: Diagnosis not present

## 2021-01-05 DIAGNOSIS — E119 Type 2 diabetes mellitus without complications: Secondary | ICD-10-CM | POA: Diagnosis not present

## 2021-01-05 DIAGNOSIS — I10 Essential (primary) hypertension: Secondary | ICD-10-CM | POA: Diagnosis not present

## 2021-01-05 DIAGNOSIS — N182 Chronic kidney disease, stage 2 (mild): Secondary | ICD-10-CM | POA: Diagnosis not present

## 2021-01-05 DIAGNOSIS — R2681 Unsteadiness on feet: Secondary | ICD-10-CM | POA: Diagnosis not present

## 2021-01-07 DIAGNOSIS — R2681 Unsteadiness on feet: Secondary | ICD-10-CM | POA: Diagnosis not present

## 2021-01-07 DIAGNOSIS — Z741 Need for assistance with personal care: Secondary | ICD-10-CM | POA: Diagnosis not present

## 2021-01-07 DIAGNOSIS — Z9181 History of falling: Secondary | ICD-10-CM | POA: Diagnosis not present

## 2021-01-07 DIAGNOSIS — I10 Essential (primary) hypertension: Secondary | ICD-10-CM | POA: Diagnosis not present

## 2021-01-07 DIAGNOSIS — M6281 Muscle weakness (generalized): Secondary | ICD-10-CM | POA: Diagnosis not present

## 2021-01-07 DIAGNOSIS — N3000 Acute cystitis without hematuria: Secondary | ICD-10-CM | POA: Diagnosis not present

## 2021-01-07 DIAGNOSIS — E119 Type 2 diabetes mellitus without complications: Secondary | ICD-10-CM | POA: Diagnosis not present

## 2021-01-07 DIAGNOSIS — R55 Syncope and collapse: Secondary | ICD-10-CM | POA: Diagnosis not present

## 2021-01-07 DIAGNOSIS — I509 Heart failure, unspecified: Secondary | ICD-10-CM | POA: Diagnosis not present

## 2021-01-07 DIAGNOSIS — G8929 Other chronic pain: Secondary | ICD-10-CM | POA: Diagnosis not present

## 2021-01-07 DIAGNOSIS — N182 Chronic kidney disease, stage 2 (mild): Secondary | ICD-10-CM | POA: Diagnosis not present

## 2021-01-08 DIAGNOSIS — I509 Heart failure, unspecified: Secondary | ICD-10-CM | POA: Diagnosis not present

## 2021-01-08 DIAGNOSIS — R55 Syncope and collapse: Secondary | ICD-10-CM | POA: Diagnosis not present

## 2021-01-08 DIAGNOSIS — E119 Type 2 diabetes mellitus without complications: Secondary | ICD-10-CM | POA: Diagnosis not present

## 2021-01-08 DIAGNOSIS — N182 Chronic kidney disease, stage 2 (mild): Secondary | ICD-10-CM | POA: Diagnosis not present

## 2021-01-08 DIAGNOSIS — I10 Essential (primary) hypertension: Secondary | ICD-10-CM | POA: Diagnosis not present

## 2021-01-08 DIAGNOSIS — G8929 Other chronic pain: Secondary | ICD-10-CM | POA: Diagnosis not present

## 2021-01-08 DIAGNOSIS — M6281 Muscle weakness (generalized): Secondary | ICD-10-CM | POA: Diagnosis not present

## 2021-01-08 DIAGNOSIS — N3000 Acute cystitis without hematuria: Secondary | ICD-10-CM | POA: Diagnosis not present

## 2021-01-08 DIAGNOSIS — Z9181 History of falling: Secondary | ICD-10-CM | POA: Diagnosis not present

## 2021-01-08 DIAGNOSIS — Z741 Need for assistance with personal care: Secondary | ICD-10-CM | POA: Diagnosis not present

## 2021-01-08 DIAGNOSIS — R2681 Unsteadiness on feet: Secondary | ICD-10-CM | POA: Diagnosis not present

## 2021-01-09 DIAGNOSIS — N182 Chronic kidney disease, stage 2 (mild): Secondary | ICD-10-CM | POA: Diagnosis not present

## 2021-01-09 DIAGNOSIS — I10 Essential (primary) hypertension: Secondary | ICD-10-CM | POA: Diagnosis not present

## 2021-01-09 DIAGNOSIS — I509 Heart failure, unspecified: Secondary | ICD-10-CM | POA: Diagnosis not present

## 2021-01-09 DIAGNOSIS — R55 Syncope and collapse: Secondary | ICD-10-CM | POA: Diagnosis not present

## 2021-01-09 DIAGNOSIS — Z9181 History of falling: Secondary | ICD-10-CM | POA: Diagnosis not present

## 2021-01-09 DIAGNOSIS — E119 Type 2 diabetes mellitus without complications: Secondary | ICD-10-CM | POA: Diagnosis not present

## 2021-01-09 DIAGNOSIS — M6281 Muscle weakness (generalized): Secondary | ICD-10-CM | POA: Diagnosis not present

## 2021-01-09 DIAGNOSIS — G8929 Other chronic pain: Secondary | ICD-10-CM | POA: Diagnosis not present

## 2021-01-09 DIAGNOSIS — N3000 Acute cystitis without hematuria: Secondary | ICD-10-CM | POA: Diagnosis not present

## 2021-01-09 DIAGNOSIS — Z741 Need for assistance with personal care: Secondary | ICD-10-CM | POA: Diagnosis not present

## 2021-01-09 DIAGNOSIS — R2681 Unsteadiness on feet: Secondary | ICD-10-CM | POA: Diagnosis not present

## 2021-01-10 DIAGNOSIS — N3000 Acute cystitis without hematuria: Secondary | ICD-10-CM | POA: Diagnosis not present

## 2021-01-10 DIAGNOSIS — R55 Syncope and collapse: Secondary | ICD-10-CM | POA: Diagnosis not present

## 2021-01-10 DIAGNOSIS — G629 Polyneuropathy, unspecified: Secondary | ICD-10-CM | POA: Diagnosis not present

## 2021-01-10 DIAGNOSIS — M6281 Muscle weakness (generalized): Secondary | ICD-10-CM | POA: Diagnosis not present

## 2021-01-10 DIAGNOSIS — M549 Dorsalgia, unspecified: Secondary | ICD-10-CM | POA: Diagnosis not present

## 2021-01-10 DIAGNOSIS — Z741 Need for assistance with personal care: Secondary | ICD-10-CM | POA: Diagnosis not present

## 2021-01-10 DIAGNOSIS — F32A Depression, unspecified: Secondary | ICD-10-CM | POA: Diagnosis not present

## 2021-01-10 DIAGNOSIS — N182 Chronic kidney disease, stage 2 (mild): Secondary | ICD-10-CM | POA: Diagnosis not present

## 2021-01-10 DIAGNOSIS — I5032 Chronic diastolic (congestive) heart failure: Secondary | ICD-10-CM | POA: Diagnosis not present

## 2021-01-10 DIAGNOSIS — G8929 Other chronic pain: Secondary | ICD-10-CM | POA: Diagnosis not present

## 2021-01-10 DIAGNOSIS — Z9181 History of falling: Secondary | ICD-10-CM | POA: Diagnosis not present

## 2021-01-10 DIAGNOSIS — I10 Essential (primary) hypertension: Secondary | ICD-10-CM | POA: Diagnosis not present

## 2021-01-10 DIAGNOSIS — E119 Type 2 diabetes mellitus without complications: Secondary | ICD-10-CM | POA: Diagnosis not present

## 2021-01-10 DIAGNOSIS — I509 Heart failure, unspecified: Secondary | ICD-10-CM | POA: Diagnosis not present

## 2021-01-10 DIAGNOSIS — R2681 Unsteadiness on feet: Secondary | ICD-10-CM | POA: Diagnosis not present

## 2021-01-11 DIAGNOSIS — R55 Syncope and collapse: Secondary | ICD-10-CM | POA: Diagnosis not present

## 2021-01-11 DIAGNOSIS — I509 Heart failure, unspecified: Secondary | ICD-10-CM | POA: Diagnosis not present

## 2021-01-11 DIAGNOSIS — R2681 Unsteadiness on feet: Secondary | ICD-10-CM | POA: Diagnosis not present

## 2021-01-11 DIAGNOSIS — Z741 Need for assistance with personal care: Secondary | ICD-10-CM | POA: Diagnosis not present

## 2021-01-11 DIAGNOSIS — N3000 Acute cystitis without hematuria: Secondary | ICD-10-CM | POA: Diagnosis not present

## 2021-01-11 DIAGNOSIS — I10 Essential (primary) hypertension: Secondary | ICD-10-CM | POA: Diagnosis not present

## 2021-01-11 DIAGNOSIS — M6281 Muscle weakness (generalized): Secondary | ICD-10-CM | POA: Diagnosis not present

## 2021-01-11 DIAGNOSIS — N182 Chronic kidney disease, stage 2 (mild): Secondary | ICD-10-CM | POA: Diagnosis not present

## 2021-01-11 DIAGNOSIS — E119 Type 2 diabetes mellitus without complications: Secondary | ICD-10-CM | POA: Diagnosis not present

## 2021-01-11 DIAGNOSIS — Z9181 History of falling: Secondary | ICD-10-CM | POA: Diagnosis not present

## 2021-01-11 DIAGNOSIS — G8929 Other chronic pain: Secondary | ICD-10-CM | POA: Diagnosis not present

## 2021-01-12 DIAGNOSIS — G629 Polyneuropathy, unspecified: Secondary | ICD-10-CM | POA: Diagnosis not present

## 2021-01-12 DIAGNOSIS — N3000 Acute cystitis without hematuria: Secondary | ICD-10-CM | POA: Diagnosis not present

## 2021-01-12 DIAGNOSIS — M6281 Muscle weakness (generalized): Secondary | ICD-10-CM | POA: Diagnosis not present

## 2021-01-12 DIAGNOSIS — R55 Syncope and collapse: Secondary | ICD-10-CM | POA: Diagnosis not present

## 2021-01-12 DIAGNOSIS — I10 Essential (primary) hypertension: Secondary | ICD-10-CM | POA: Diagnosis not present

## 2021-01-12 DIAGNOSIS — N182 Chronic kidney disease, stage 2 (mild): Secondary | ICD-10-CM | POA: Diagnosis not present

## 2021-01-12 DIAGNOSIS — M549 Dorsalgia, unspecified: Secondary | ICD-10-CM | POA: Diagnosis not present

## 2021-01-12 DIAGNOSIS — I5032 Chronic diastolic (congestive) heart failure: Secondary | ICD-10-CM | POA: Diagnosis not present

## 2021-01-12 DIAGNOSIS — Z741 Need for assistance with personal care: Secondary | ICD-10-CM | POA: Diagnosis not present

## 2021-01-12 DIAGNOSIS — Z9181 History of falling: Secondary | ICD-10-CM | POA: Diagnosis not present

## 2021-01-12 DIAGNOSIS — R2681 Unsteadiness on feet: Secondary | ICD-10-CM | POA: Diagnosis not present

## 2021-01-12 DIAGNOSIS — E119 Type 2 diabetes mellitus without complications: Secondary | ICD-10-CM | POA: Diagnosis not present

## 2021-01-12 DIAGNOSIS — G8929 Other chronic pain: Secondary | ICD-10-CM | POA: Diagnosis not present

## 2021-01-12 DIAGNOSIS — I509 Heart failure, unspecified: Secondary | ICD-10-CM | POA: Diagnosis not present

## 2021-01-15 DIAGNOSIS — R2681 Unsteadiness on feet: Secondary | ICD-10-CM | POA: Diagnosis not present

## 2021-01-15 DIAGNOSIS — R55 Syncope and collapse: Secondary | ICD-10-CM | POA: Diagnosis not present

## 2021-01-15 DIAGNOSIS — G8929 Other chronic pain: Secondary | ICD-10-CM | POA: Diagnosis not present

## 2021-01-15 DIAGNOSIS — N3000 Acute cystitis without hematuria: Secondary | ICD-10-CM | POA: Diagnosis not present

## 2021-01-15 DIAGNOSIS — Z741 Need for assistance with personal care: Secondary | ICD-10-CM | POA: Diagnosis not present

## 2021-01-15 DIAGNOSIS — E119 Type 2 diabetes mellitus without complications: Secondary | ICD-10-CM | POA: Diagnosis not present

## 2021-01-15 DIAGNOSIS — I10 Essential (primary) hypertension: Secondary | ICD-10-CM | POA: Diagnosis not present

## 2021-01-15 DIAGNOSIS — M6281 Muscle weakness (generalized): Secondary | ICD-10-CM | POA: Diagnosis not present

## 2021-01-15 DIAGNOSIS — N182 Chronic kidney disease, stage 2 (mild): Secondary | ICD-10-CM | POA: Diagnosis not present

## 2021-01-15 DIAGNOSIS — Z9181 History of falling: Secondary | ICD-10-CM | POA: Diagnosis not present

## 2021-01-15 DIAGNOSIS — I509 Heart failure, unspecified: Secondary | ICD-10-CM | POA: Diagnosis not present

## 2021-01-16 DIAGNOSIS — G8929 Other chronic pain: Secondary | ICD-10-CM | POA: Diagnosis not present

## 2021-01-16 DIAGNOSIS — N3000 Acute cystitis without hematuria: Secondary | ICD-10-CM | POA: Diagnosis not present

## 2021-01-16 DIAGNOSIS — N182 Chronic kidney disease, stage 2 (mild): Secondary | ICD-10-CM | POA: Diagnosis not present

## 2021-01-16 DIAGNOSIS — R55 Syncope and collapse: Secondary | ICD-10-CM | POA: Diagnosis not present

## 2021-01-16 DIAGNOSIS — I509 Heart failure, unspecified: Secondary | ICD-10-CM | POA: Diagnosis not present

## 2021-01-16 DIAGNOSIS — Z741 Need for assistance with personal care: Secondary | ICD-10-CM | POA: Diagnosis not present

## 2021-01-16 DIAGNOSIS — M6281 Muscle weakness (generalized): Secondary | ICD-10-CM | POA: Diagnosis not present

## 2021-01-16 DIAGNOSIS — R2681 Unsteadiness on feet: Secondary | ICD-10-CM | POA: Diagnosis not present

## 2021-01-16 DIAGNOSIS — I10 Essential (primary) hypertension: Secondary | ICD-10-CM | POA: Diagnosis not present

## 2021-01-16 DIAGNOSIS — Z9181 History of falling: Secondary | ICD-10-CM | POA: Diagnosis not present

## 2021-01-16 DIAGNOSIS — E119 Type 2 diabetes mellitus without complications: Secondary | ICD-10-CM | POA: Diagnosis not present

## 2021-01-17 DIAGNOSIS — Z9181 History of falling: Secondary | ICD-10-CM | POA: Diagnosis not present

## 2021-01-17 DIAGNOSIS — I5032 Chronic diastolic (congestive) heart failure: Secondary | ICD-10-CM | POA: Diagnosis not present

## 2021-01-17 DIAGNOSIS — R55 Syncope and collapse: Secondary | ICD-10-CM | POA: Diagnosis not present

## 2021-01-17 DIAGNOSIS — E119 Type 2 diabetes mellitus without complications: Secondary | ICD-10-CM | POA: Diagnosis not present

## 2021-01-17 DIAGNOSIS — R2681 Unsteadiness on feet: Secondary | ICD-10-CM | POA: Diagnosis not present

## 2021-01-17 DIAGNOSIS — I10 Essential (primary) hypertension: Secondary | ICD-10-CM | POA: Diagnosis not present

## 2021-01-17 DIAGNOSIS — N182 Chronic kidney disease, stage 2 (mild): Secondary | ICD-10-CM | POA: Diagnosis not present

## 2021-01-17 DIAGNOSIS — G8929 Other chronic pain: Secondary | ICD-10-CM | POA: Diagnosis not present

## 2021-01-17 DIAGNOSIS — F32A Depression, unspecified: Secondary | ICD-10-CM | POA: Diagnosis not present

## 2021-01-17 DIAGNOSIS — N3000 Acute cystitis without hematuria: Secondary | ICD-10-CM | POA: Diagnosis not present

## 2021-01-17 DIAGNOSIS — M549 Dorsalgia, unspecified: Secondary | ICD-10-CM | POA: Diagnosis not present

## 2021-01-17 DIAGNOSIS — M6281 Muscle weakness (generalized): Secondary | ICD-10-CM | POA: Diagnosis not present

## 2021-01-17 DIAGNOSIS — I509 Heart failure, unspecified: Secondary | ICD-10-CM | POA: Diagnosis not present

## 2021-01-17 DIAGNOSIS — Z741 Need for assistance with personal care: Secondary | ICD-10-CM | POA: Diagnosis not present

## 2021-01-18 DIAGNOSIS — N182 Chronic kidney disease, stage 2 (mild): Secondary | ICD-10-CM | POA: Diagnosis not present

## 2021-01-18 DIAGNOSIS — Z9181 History of falling: Secondary | ICD-10-CM | POA: Diagnosis not present

## 2021-01-18 DIAGNOSIS — R2681 Unsteadiness on feet: Secondary | ICD-10-CM | POA: Diagnosis not present

## 2021-01-18 DIAGNOSIS — N3000 Acute cystitis without hematuria: Secondary | ICD-10-CM | POA: Diagnosis not present

## 2021-01-18 DIAGNOSIS — M6281 Muscle weakness (generalized): Secondary | ICD-10-CM | POA: Diagnosis not present

## 2021-01-18 DIAGNOSIS — I509 Heart failure, unspecified: Secondary | ICD-10-CM | POA: Diagnosis not present

## 2021-01-18 DIAGNOSIS — I10 Essential (primary) hypertension: Secondary | ICD-10-CM | POA: Diagnosis not present

## 2021-01-18 DIAGNOSIS — E119 Type 2 diabetes mellitus without complications: Secondary | ICD-10-CM | POA: Diagnosis not present

## 2021-01-18 DIAGNOSIS — Z741 Need for assistance with personal care: Secondary | ICD-10-CM | POA: Diagnosis not present

## 2021-01-18 DIAGNOSIS — G8929 Other chronic pain: Secondary | ICD-10-CM | POA: Diagnosis not present

## 2021-01-18 DIAGNOSIS — R55 Syncope and collapse: Secondary | ICD-10-CM | POA: Diagnosis not present

## 2021-01-19 DIAGNOSIS — E119 Type 2 diabetes mellitus without complications: Secondary | ICD-10-CM | POA: Diagnosis not present

## 2021-01-19 DIAGNOSIS — G8929 Other chronic pain: Secondary | ICD-10-CM | POA: Diagnosis not present

## 2021-01-19 DIAGNOSIS — Z741 Need for assistance with personal care: Secondary | ICD-10-CM | POA: Diagnosis not present

## 2021-01-19 DIAGNOSIS — N3000 Acute cystitis without hematuria: Secondary | ICD-10-CM | POA: Diagnosis not present

## 2021-01-19 DIAGNOSIS — I509 Heart failure, unspecified: Secondary | ICD-10-CM | POA: Diagnosis not present

## 2021-01-19 DIAGNOSIS — I10 Essential (primary) hypertension: Secondary | ICD-10-CM | POA: Diagnosis not present

## 2021-01-19 DIAGNOSIS — Z9181 History of falling: Secondary | ICD-10-CM | POA: Diagnosis not present

## 2021-01-19 DIAGNOSIS — R2681 Unsteadiness on feet: Secondary | ICD-10-CM | POA: Diagnosis not present

## 2021-01-19 DIAGNOSIS — N182 Chronic kidney disease, stage 2 (mild): Secondary | ICD-10-CM | POA: Diagnosis not present

## 2021-01-19 DIAGNOSIS — M6281 Muscle weakness (generalized): Secondary | ICD-10-CM | POA: Diagnosis not present

## 2021-01-19 DIAGNOSIS — R55 Syncope and collapse: Secondary | ICD-10-CM | POA: Diagnosis not present

## 2021-01-21 DIAGNOSIS — E119 Type 2 diabetes mellitus without complications: Secondary | ICD-10-CM | POA: Diagnosis not present

## 2021-01-21 DIAGNOSIS — Z9181 History of falling: Secondary | ICD-10-CM | POA: Diagnosis not present

## 2021-01-21 DIAGNOSIS — I509 Heart failure, unspecified: Secondary | ICD-10-CM | POA: Diagnosis not present

## 2021-01-21 DIAGNOSIS — G8929 Other chronic pain: Secondary | ICD-10-CM | POA: Diagnosis not present

## 2021-01-21 DIAGNOSIS — I10 Essential (primary) hypertension: Secondary | ICD-10-CM | POA: Diagnosis not present

## 2021-01-21 DIAGNOSIS — Z741 Need for assistance with personal care: Secondary | ICD-10-CM | POA: Diagnosis not present

## 2021-01-21 DIAGNOSIS — N182 Chronic kidney disease, stage 2 (mild): Secondary | ICD-10-CM | POA: Diagnosis not present

## 2021-01-21 DIAGNOSIS — M6281 Muscle weakness (generalized): Secondary | ICD-10-CM | POA: Diagnosis not present

## 2021-01-21 DIAGNOSIS — R2681 Unsteadiness on feet: Secondary | ICD-10-CM | POA: Diagnosis not present

## 2021-01-21 DIAGNOSIS — R55 Syncope and collapse: Secondary | ICD-10-CM | POA: Diagnosis not present

## 2021-01-21 DIAGNOSIS — N3000 Acute cystitis without hematuria: Secondary | ICD-10-CM | POA: Diagnosis not present

## 2021-01-22 DIAGNOSIS — M6281 Muscle weakness (generalized): Secondary | ICD-10-CM | POA: Diagnosis not present

## 2021-01-22 DIAGNOSIS — N3000 Acute cystitis without hematuria: Secondary | ICD-10-CM | POA: Diagnosis not present

## 2021-01-22 DIAGNOSIS — Z9181 History of falling: Secondary | ICD-10-CM | POA: Diagnosis not present

## 2021-01-22 DIAGNOSIS — R2681 Unsteadiness on feet: Secondary | ICD-10-CM | POA: Diagnosis not present

## 2021-01-22 DIAGNOSIS — Z741 Need for assistance with personal care: Secondary | ICD-10-CM | POA: Diagnosis not present

## 2021-01-22 DIAGNOSIS — I509 Heart failure, unspecified: Secondary | ICD-10-CM | POA: Diagnosis not present

## 2021-01-22 DIAGNOSIS — E119 Type 2 diabetes mellitus without complications: Secondary | ICD-10-CM | POA: Diagnosis not present

## 2021-01-22 DIAGNOSIS — G8929 Other chronic pain: Secondary | ICD-10-CM | POA: Diagnosis not present

## 2021-01-22 DIAGNOSIS — N182 Chronic kidney disease, stage 2 (mild): Secondary | ICD-10-CM | POA: Diagnosis not present

## 2021-01-22 DIAGNOSIS — R55 Syncope and collapse: Secondary | ICD-10-CM | POA: Diagnosis not present

## 2021-01-22 DIAGNOSIS — I10 Essential (primary) hypertension: Secondary | ICD-10-CM | POA: Diagnosis not present

## 2021-01-23 DIAGNOSIS — N182 Chronic kidney disease, stage 2 (mild): Secondary | ICD-10-CM | POA: Diagnosis not present

## 2021-01-23 DIAGNOSIS — I10 Essential (primary) hypertension: Secondary | ICD-10-CM | POA: Diagnosis not present

## 2021-01-23 DIAGNOSIS — Z741 Need for assistance with personal care: Secondary | ICD-10-CM | POA: Diagnosis not present

## 2021-01-23 DIAGNOSIS — R55 Syncope and collapse: Secondary | ICD-10-CM | POA: Diagnosis not present

## 2021-01-23 DIAGNOSIS — I509 Heart failure, unspecified: Secondary | ICD-10-CM | POA: Diagnosis not present

## 2021-01-23 DIAGNOSIS — R2681 Unsteadiness on feet: Secondary | ICD-10-CM | POA: Diagnosis not present

## 2021-01-23 DIAGNOSIS — E089 Diabetes mellitus due to underlying condition without complications: Secondary | ICD-10-CM | POA: Diagnosis not present

## 2021-01-23 DIAGNOSIS — Z9181 History of falling: Secondary | ICD-10-CM | POA: Diagnosis not present

## 2021-01-23 DIAGNOSIS — N3000 Acute cystitis without hematuria: Secondary | ICD-10-CM | POA: Diagnosis not present

## 2021-01-23 DIAGNOSIS — M6281 Muscle weakness (generalized): Secondary | ICD-10-CM | POA: Diagnosis not present

## 2021-01-23 DIAGNOSIS — E785 Hyperlipidemia, unspecified: Secondary | ICD-10-CM | POA: Diagnosis not present

## 2021-01-23 DIAGNOSIS — G8929 Other chronic pain: Secondary | ICD-10-CM | POA: Diagnosis not present

## 2021-01-23 DIAGNOSIS — E119 Type 2 diabetes mellitus without complications: Secondary | ICD-10-CM | POA: Diagnosis not present

## 2021-01-24 DIAGNOSIS — R2681 Unsteadiness on feet: Secondary | ICD-10-CM | POA: Diagnosis not present

## 2021-01-24 DIAGNOSIS — G8929 Other chronic pain: Secondary | ICD-10-CM | POA: Diagnosis not present

## 2021-01-24 DIAGNOSIS — I509 Heart failure, unspecified: Secondary | ICD-10-CM | POA: Diagnosis not present

## 2021-01-24 DIAGNOSIS — Z9181 History of falling: Secondary | ICD-10-CM | POA: Diagnosis not present

## 2021-01-24 DIAGNOSIS — M6281 Muscle weakness (generalized): Secondary | ICD-10-CM | POA: Diagnosis not present

## 2021-01-24 DIAGNOSIS — N182 Chronic kidney disease, stage 2 (mild): Secondary | ICD-10-CM | POA: Diagnosis not present

## 2021-01-24 DIAGNOSIS — R55 Syncope and collapse: Secondary | ICD-10-CM | POA: Diagnosis not present

## 2021-01-24 DIAGNOSIS — I10 Essential (primary) hypertension: Secondary | ICD-10-CM | POA: Diagnosis not present

## 2021-01-24 DIAGNOSIS — N3000 Acute cystitis without hematuria: Secondary | ICD-10-CM | POA: Diagnosis not present

## 2021-01-24 DIAGNOSIS — E119 Type 2 diabetes mellitus without complications: Secondary | ICD-10-CM | POA: Diagnosis not present

## 2021-01-24 DIAGNOSIS — Z741 Need for assistance with personal care: Secondary | ICD-10-CM | POA: Diagnosis not present

## 2021-01-25 DIAGNOSIS — I509 Heart failure, unspecified: Secondary | ICD-10-CM | POA: Diagnosis not present

## 2021-01-25 DIAGNOSIS — R55 Syncope and collapse: Secondary | ICD-10-CM | POA: Diagnosis not present

## 2021-01-25 DIAGNOSIS — I10 Essential (primary) hypertension: Secondary | ICD-10-CM | POA: Diagnosis not present

## 2021-01-25 DIAGNOSIS — N182 Chronic kidney disease, stage 2 (mild): Secondary | ICD-10-CM | POA: Diagnosis not present

## 2021-01-25 DIAGNOSIS — M6281 Muscle weakness (generalized): Secondary | ICD-10-CM | POA: Diagnosis not present

## 2021-01-25 DIAGNOSIS — N3000 Acute cystitis without hematuria: Secondary | ICD-10-CM | POA: Diagnosis not present

## 2021-01-25 DIAGNOSIS — G8929 Other chronic pain: Secondary | ICD-10-CM | POA: Diagnosis not present

## 2021-01-25 DIAGNOSIS — Z9181 History of falling: Secondary | ICD-10-CM | POA: Diagnosis not present

## 2021-01-25 DIAGNOSIS — Z741 Need for assistance with personal care: Secondary | ICD-10-CM | POA: Diagnosis not present

## 2021-01-25 DIAGNOSIS — E119 Type 2 diabetes mellitus without complications: Secondary | ICD-10-CM | POA: Diagnosis not present

## 2021-01-25 DIAGNOSIS — R2681 Unsteadiness on feet: Secondary | ICD-10-CM | POA: Diagnosis not present

## 2021-01-26 DIAGNOSIS — E113292 Type 2 diabetes mellitus with mild nonproliferative diabetic retinopathy without macular edema, left eye: Secondary | ICD-10-CM | POA: Diagnosis not present

## 2021-01-26 DIAGNOSIS — N3000 Acute cystitis without hematuria: Secondary | ICD-10-CM | POA: Diagnosis not present

## 2021-01-26 DIAGNOSIS — I10 Essential (primary) hypertension: Secondary | ICD-10-CM | POA: Diagnosis not present

## 2021-01-26 DIAGNOSIS — G8929 Other chronic pain: Secondary | ICD-10-CM | POA: Diagnosis not present

## 2021-01-26 DIAGNOSIS — H5211 Myopia, right eye: Secondary | ICD-10-CM | POA: Diagnosis not present

## 2021-01-26 DIAGNOSIS — R55 Syncope and collapse: Secondary | ICD-10-CM | POA: Diagnosis not present

## 2021-01-26 DIAGNOSIS — Z741 Need for assistance with personal care: Secondary | ICD-10-CM | POA: Diagnosis not present

## 2021-01-26 DIAGNOSIS — R2681 Unsteadiness on feet: Secondary | ICD-10-CM | POA: Diagnosis not present

## 2021-01-26 DIAGNOSIS — Z9181 History of falling: Secondary | ICD-10-CM | POA: Diagnosis not present

## 2021-01-26 DIAGNOSIS — I509 Heart failure, unspecified: Secondary | ICD-10-CM | POA: Diagnosis not present

## 2021-01-26 DIAGNOSIS — E119 Type 2 diabetes mellitus without complications: Secondary | ICD-10-CM | POA: Diagnosis not present

## 2021-01-26 DIAGNOSIS — Z961 Presence of intraocular lens: Secondary | ICD-10-CM | POA: Diagnosis not present

## 2021-01-26 DIAGNOSIS — M6281 Muscle weakness (generalized): Secondary | ICD-10-CM | POA: Diagnosis not present

## 2021-01-26 DIAGNOSIS — H5202 Hypermetropia, left eye: Secondary | ICD-10-CM | POA: Diagnosis not present

## 2021-01-26 DIAGNOSIS — N182 Chronic kidney disease, stage 2 (mild): Secondary | ICD-10-CM | POA: Diagnosis not present

## 2021-01-29 DIAGNOSIS — Z9181 History of falling: Secondary | ICD-10-CM | POA: Diagnosis not present

## 2021-01-29 DIAGNOSIS — G629 Polyneuropathy, unspecified: Secondary | ICD-10-CM | POA: Diagnosis not present

## 2021-01-29 DIAGNOSIS — K5900 Constipation, unspecified: Secondary | ICD-10-CM | POA: Diagnosis not present

## 2021-01-29 DIAGNOSIS — I5032 Chronic diastolic (congestive) heart failure: Secondary | ICD-10-CM | POA: Diagnosis not present

## 2021-01-29 DIAGNOSIS — I509 Heart failure, unspecified: Secondary | ICD-10-CM | POA: Diagnosis not present

## 2021-01-29 DIAGNOSIS — R2681 Unsteadiness on feet: Secondary | ICD-10-CM | POA: Diagnosis not present

## 2021-01-29 DIAGNOSIS — I10 Essential (primary) hypertension: Secondary | ICD-10-CM | POA: Diagnosis not present

## 2021-01-29 DIAGNOSIS — G8929 Other chronic pain: Secondary | ICD-10-CM | POA: Diagnosis not present

## 2021-01-29 DIAGNOSIS — M6281 Muscle weakness (generalized): Secondary | ICD-10-CM | POA: Diagnosis not present

## 2021-01-29 DIAGNOSIS — H5213 Myopia, bilateral: Secondary | ICD-10-CM | POA: Diagnosis not present

## 2021-01-29 DIAGNOSIS — R55 Syncope and collapse: Secondary | ICD-10-CM | POA: Diagnosis not present

## 2021-01-29 DIAGNOSIS — N3000 Acute cystitis without hematuria: Secondary | ICD-10-CM | POA: Diagnosis not present

## 2021-01-29 DIAGNOSIS — M549 Dorsalgia, unspecified: Secondary | ICD-10-CM | POA: Diagnosis not present

## 2021-01-29 DIAGNOSIS — E119 Type 2 diabetes mellitus without complications: Secondary | ICD-10-CM | POA: Diagnosis not present

## 2021-01-29 DIAGNOSIS — F32A Depression, unspecified: Secondary | ICD-10-CM | POA: Diagnosis not present

## 2021-01-29 DIAGNOSIS — Z741 Need for assistance with personal care: Secondary | ICD-10-CM | POA: Diagnosis not present

## 2021-01-29 DIAGNOSIS — N182 Chronic kidney disease, stage 2 (mild): Secondary | ICD-10-CM | POA: Diagnosis not present

## 2021-01-30 DIAGNOSIS — N3000 Acute cystitis without hematuria: Secondary | ICD-10-CM | POA: Diagnosis not present

## 2021-01-30 DIAGNOSIS — E119 Type 2 diabetes mellitus without complications: Secondary | ICD-10-CM | POA: Diagnosis not present

## 2021-01-30 DIAGNOSIS — M6281 Muscle weakness (generalized): Secondary | ICD-10-CM | POA: Diagnosis not present

## 2021-01-30 DIAGNOSIS — R2681 Unsteadiness on feet: Secondary | ICD-10-CM | POA: Diagnosis not present

## 2021-01-30 DIAGNOSIS — G8929 Other chronic pain: Secondary | ICD-10-CM | POA: Diagnosis not present

## 2021-01-30 DIAGNOSIS — I10 Essential (primary) hypertension: Secondary | ICD-10-CM | POA: Diagnosis not present

## 2021-01-30 DIAGNOSIS — Z741 Need for assistance with personal care: Secondary | ICD-10-CM | POA: Diagnosis not present

## 2021-01-30 DIAGNOSIS — R55 Syncope and collapse: Secondary | ICD-10-CM | POA: Diagnosis not present

## 2021-01-30 DIAGNOSIS — I509 Heart failure, unspecified: Secondary | ICD-10-CM | POA: Diagnosis not present

## 2021-01-30 DIAGNOSIS — Z9181 History of falling: Secondary | ICD-10-CM | POA: Diagnosis not present

## 2021-01-30 DIAGNOSIS — N182 Chronic kidney disease, stage 2 (mild): Secondary | ICD-10-CM | POA: Diagnosis not present

## 2021-01-31 DIAGNOSIS — I10 Essential (primary) hypertension: Secondary | ICD-10-CM | POA: Diagnosis not present

## 2021-01-31 DIAGNOSIS — R55 Syncope and collapse: Secondary | ICD-10-CM | POA: Diagnosis not present

## 2021-01-31 DIAGNOSIS — E119 Type 2 diabetes mellitus without complications: Secondary | ICD-10-CM | POA: Diagnosis not present

## 2021-01-31 DIAGNOSIS — I509 Heart failure, unspecified: Secondary | ICD-10-CM | POA: Diagnosis not present

## 2021-01-31 DIAGNOSIS — Z741 Need for assistance with personal care: Secondary | ICD-10-CM | POA: Diagnosis not present

## 2021-01-31 DIAGNOSIS — M6281 Muscle weakness (generalized): Secondary | ICD-10-CM | POA: Diagnosis not present

## 2021-01-31 DIAGNOSIS — R2681 Unsteadiness on feet: Secondary | ICD-10-CM | POA: Diagnosis not present

## 2021-01-31 DIAGNOSIS — N182 Chronic kidney disease, stage 2 (mild): Secondary | ICD-10-CM | POA: Diagnosis not present

## 2021-01-31 DIAGNOSIS — Z9181 History of falling: Secondary | ICD-10-CM | POA: Diagnosis not present

## 2021-01-31 DIAGNOSIS — N3000 Acute cystitis without hematuria: Secondary | ICD-10-CM | POA: Diagnosis not present

## 2021-01-31 DIAGNOSIS — G8929 Other chronic pain: Secondary | ICD-10-CM | POA: Diagnosis not present

## 2021-02-02 DIAGNOSIS — I2510 Atherosclerotic heart disease of native coronary artery without angina pectoris: Secondary | ICD-10-CM | POA: Diagnosis not present

## 2021-02-02 DIAGNOSIS — I10 Essential (primary) hypertension: Secondary | ICD-10-CM | POA: Diagnosis not present

## 2021-02-02 DIAGNOSIS — R55 Syncope and collapse: Secondary | ICD-10-CM | POA: Diagnosis not present

## 2021-02-02 DIAGNOSIS — N182 Chronic kidney disease, stage 2 (mild): Secondary | ICD-10-CM | POA: Diagnosis not present

## 2021-02-02 DIAGNOSIS — N3000 Acute cystitis without hematuria: Secondary | ICD-10-CM | POA: Diagnosis not present

## 2021-02-02 DIAGNOSIS — G8929 Other chronic pain: Secondary | ICD-10-CM | POA: Diagnosis not present

## 2021-02-02 DIAGNOSIS — E119 Type 2 diabetes mellitus without complications: Secondary | ICD-10-CM | POA: Diagnosis not present

## 2021-02-02 DIAGNOSIS — M6281 Muscle weakness (generalized): Secondary | ICD-10-CM | POA: Diagnosis not present

## 2021-02-02 DIAGNOSIS — N390 Urinary tract infection, site not specified: Secondary | ICD-10-CM | POA: Diagnosis not present

## 2021-02-02 DIAGNOSIS — I509 Heart failure, unspecified: Secondary | ICD-10-CM | POA: Diagnosis not present

## 2021-02-02 DIAGNOSIS — I951 Orthostatic hypotension: Secondary | ICD-10-CM | POA: Diagnosis not present

## 2021-02-02 DIAGNOSIS — Z9181 History of falling: Secondary | ICD-10-CM | POA: Diagnosis not present

## 2021-02-02 DIAGNOSIS — R2681 Unsteadiness on feet: Secondary | ICD-10-CM | POA: Diagnosis not present

## 2021-02-02 DIAGNOSIS — Z741 Need for assistance with personal care: Secondary | ICD-10-CM | POA: Diagnosis not present

## 2021-02-05 DIAGNOSIS — R2681 Unsteadiness on feet: Secondary | ICD-10-CM | POA: Diagnosis not present

## 2021-02-05 DIAGNOSIS — Z741 Need for assistance with personal care: Secondary | ICD-10-CM | POA: Diagnosis not present

## 2021-02-05 DIAGNOSIS — M6281 Muscle weakness (generalized): Secondary | ICD-10-CM | POA: Diagnosis not present

## 2021-02-05 DIAGNOSIS — G8929 Other chronic pain: Secondary | ICD-10-CM | POA: Diagnosis not present

## 2021-02-05 DIAGNOSIS — K5900 Constipation, unspecified: Secondary | ICD-10-CM | POA: Diagnosis not present

## 2021-02-05 DIAGNOSIS — Z9181 History of falling: Secondary | ICD-10-CM | POA: Diagnosis not present

## 2021-02-05 DIAGNOSIS — N3000 Acute cystitis without hematuria: Secondary | ICD-10-CM | POA: Diagnosis not present

## 2021-02-05 DIAGNOSIS — I10 Essential (primary) hypertension: Secondary | ICD-10-CM | POA: Diagnosis not present

## 2021-02-05 DIAGNOSIS — N182 Chronic kidney disease, stage 2 (mild): Secondary | ICD-10-CM | POA: Diagnosis not present

## 2021-02-05 DIAGNOSIS — F32A Depression, unspecified: Secondary | ICD-10-CM | POA: Diagnosis not present

## 2021-02-05 DIAGNOSIS — I509 Heart failure, unspecified: Secondary | ICD-10-CM | POA: Diagnosis not present

## 2021-02-05 DIAGNOSIS — M549 Dorsalgia, unspecified: Secondary | ICD-10-CM | POA: Diagnosis not present

## 2021-02-05 DIAGNOSIS — I5032 Chronic diastolic (congestive) heart failure: Secondary | ICD-10-CM | POA: Diagnosis not present

## 2021-02-05 DIAGNOSIS — R55 Syncope and collapse: Secondary | ICD-10-CM | POA: Diagnosis not present

## 2021-02-05 DIAGNOSIS — G629 Polyneuropathy, unspecified: Secondary | ICD-10-CM | POA: Diagnosis not present

## 2021-02-05 DIAGNOSIS — E119 Type 2 diabetes mellitus without complications: Secondary | ICD-10-CM | POA: Diagnosis not present

## 2021-02-07 DIAGNOSIS — J9601 Acute respiratory failure with hypoxia: Secondary | ICD-10-CM | POA: Diagnosis not present

## 2021-02-07 DIAGNOSIS — R2681 Unsteadiness on feet: Secondary | ICD-10-CM | POA: Diagnosis not present

## 2021-02-07 DIAGNOSIS — M6281 Muscle weakness (generalized): Secondary | ICD-10-CM | POA: Diagnosis not present

## 2021-02-07 DIAGNOSIS — G894 Chronic pain syndrome: Secondary | ICD-10-CM | POA: Diagnosis not present

## 2021-02-07 DIAGNOSIS — G629 Polyneuropathy, unspecified: Secondary | ICD-10-CM | POA: Diagnosis not present

## 2021-02-07 DIAGNOSIS — F32A Depression, unspecified: Secondary | ICD-10-CM | POA: Diagnosis not present

## 2021-02-07 DIAGNOSIS — Z741 Need for assistance with personal care: Secondary | ICD-10-CM | POA: Diagnosis not present

## 2021-02-07 DIAGNOSIS — Z9181 History of falling: Secondary | ICD-10-CM | POA: Diagnosis not present

## 2021-02-07 DIAGNOSIS — M549 Dorsalgia, unspecified: Secondary | ICD-10-CM | POA: Diagnosis not present

## 2021-02-07 DIAGNOSIS — I5032 Chronic diastolic (congestive) heart failure: Secondary | ICD-10-CM | POA: Diagnosis not present

## 2021-02-08 DIAGNOSIS — Z9181 History of falling: Secondary | ICD-10-CM | POA: Diagnosis not present

## 2021-02-08 DIAGNOSIS — M6281 Muscle weakness (generalized): Secondary | ICD-10-CM | POA: Diagnosis not present

## 2021-02-08 DIAGNOSIS — J9601 Acute respiratory failure with hypoxia: Secondary | ICD-10-CM | POA: Diagnosis not present

## 2021-02-08 DIAGNOSIS — Z741 Need for assistance with personal care: Secondary | ICD-10-CM | POA: Diagnosis not present

## 2021-02-08 DIAGNOSIS — R2681 Unsteadiness on feet: Secondary | ICD-10-CM | POA: Diagnosis not present

## 2021-02-09 DIAGNOSIS — R2681 Unsteadiness on feet: Secondary | ICD-10-CM | POA: Diagnosis not present

## 2021-02-09 DIAGNOSIS — Z9181 History of falling: Secondary | ICD-10-CM | POA: Diagnosis not present

## 2021-02-09 DIAGNOSIS — J9601 Acute respiratory failure with hypoxia: Secondary | ICD-10-CM | POA: Diagnosis not present

## 2021-02-09 DIAGNOSIS — M6281 Muscle weakness (generalized): Secondary | ICD-10-CM | POA: Diagnosis not present

## 2021-02-09 DIAGNOSIS — Z741 Need for assistance with personal care: Secondary | ICD-10-CM | POA: Diagnosis not present

## 2021-02-12 DIAGNOSIS — R2681 Unsteadiness on feet: Secondary | ICD-10-CM | POA: Diagnosis not present

## 2021-02-12 DIAGNOSIS — J9601 Acute respiratory failure with hypoxia: Secondary | ICD-10-CM | POA: Diagnosis not present

## 2021-02-12 DIAGNOSIS — Z741 Need for assistance with personal care: Secondary | ICD-10-CM | POA: Diagnosis not present

## 2021-02-12 DIAGNOSIS — Z9181 History of falling: Secondary | ICD-10-CM | POA: Diagnosis not present

## 2021-02-12 DIAGNOSIS — M6281 Muscle weakness (generalized): Secondary | ICD-10-CM | POA: Diagnosis not present

## 2021-02-13 ENCOUNTER — Inpatient Hospital Stay (HOSPITAL_COMMUNITY): Payer: Medicare Other

## 2021-02-13 ENCOUNTER — Emergency Department (HOSPITAL_COMMUNITY): Payer: Medicare Other

## 2021-02-13 ENCOUNTER — Encounter (HOSPITAL_COMMUNITY): Payer: Self-pay

## 2021-02-13 ENCOUNTER — Inpatient Hospital Stay (HOSPITAL_COMMUNITY)
Admission: EM | Admit: 2021-02-13 | Discharge: 2021-02-20 | DRG: 871 | Disposition: A | Discharge status: Skilled Nursing Facility | Payer: Medicare Other | Source: Skilled Nursing Facility | Attending: Internal Medicine | Admitting: Internal Medicine

## 2021-02-13 DIAGNOSIS — G9340 Encephalopathy, unspecified: Secondary | ICD-10-CM

## 2021-02-13 DIAGNOSIS — G9341 Metabolic encephalopathy: Secondary | ICD-10-CM | POA: Diagnosis not present

## 2021-02-13 DIAGNOSIS — R778 Other specified abnormalities of plasma proteins: Secondary | ICD-10-CM

## 2021-02-13 DIAGNOSIS — Z8249 Family history of ischemic heart disease and other diseases of the circulatory system: Secondary | ICD-10-CM

## 2021-02-13 DIAGNOSIS — R4781 Slurred speech: Secondary | ICD-10-CM | POA: Diagnosis present

## 2021-02-13 DIAGNOSIS — Z79899 Other long term (current) drug therapy: Secondary | ICD-10-CM

## 2021-02-13 DIAGNOSIS — R4182 Altered mental status, unspecified: Secondary | ICD-10-CM | POA: Diagnosis not present

## 2021-02-13 DIAGNOSIS — J90 Pleural effusion, not elsewhere classified: Secondary | ICD-10-CM | POA: Diagnosis not present

## 2021-02-13 DIAGNOSIS — N179 Acute kidney failure, unspecified: Secondary | ICD-10-CM | POA: Diagnosis present

## 2021-02-13 DIAGNOSIS — I700 Atherosclerosis of aorta: Secondary | ICD-10-CM | POA: Diagnosis not present

## 2021-02-13 DIAGNOSIS — A0472 Enterocolitis due to Clostridium difficile, not specified as recurrent: Secondary | ICD-10-CM | POA: Diagnosis present

## 2021-02-13 DIAGNOSIS — I5033 Acute on chronic diastolic (congestive) heart failure: Secondary | ICD-10-CM | POA: Diagnosis present

## 2021-02-13 DIAGNOSIS — R7881 Bacteremia: Secondary | ICD-10-CM

## 2021-02-13 DIAGNOSIS — J9601 Acute respiratory failure with hypoxia: Secondary | ICD-10-CM | POA: Diagnosis not present

## 2021-02-13 DIAGNOSIS — F32A Depression, unspecified: Secondary | ICD-10-CM | POA: Diagnosis present

## 2021-02-13 DIAGNOSIS — E1165 Type 2 diabetes mellitus with hyperglycemia: Secondary | ICD-10-CM | POA: Diagnosis not present

## 2021-02-13 DIAGNOSIS — N390 Urinary tract infection, site not specified: Secondary | ICD-10-CM | POA: Diagnosis present

## 2021-02-13 DIAGNOSIS — A4151 Sepsis due to Escherichia coli [E. coli]: Secondary | ICD-10-CM | POA: Diagnosis not present

## 2021-02-13 DIAGNOSIS — I959 Hypotension, unspecified: Secondary | ICD-10-CM | POA: Diagnosis not present

## 2021-02-13 DIAGNOSIS — G2581 Restless legs syndrome: Secondary | ICD-10-CM | POA: Diagnosis present

## 2021-02-13 DIAGNOSIS — Z83438 Family history of other disorder of lipoprotein metabolism and other lipidemia: Secondary | ICD-10-CM

## 2021-02-13 DIAGNOSIS — A419 Sepsis, unspecified organism: Secondary | ICD-10-CM

## 2021-02-13 DIAGNOSIS — I2510 Atherosclerotic heart disease of native coronary artery without angina pectoris: Secondary | ICD-10-CM | POA: Diagnosis present

## 2021-02-13 DIAGNOSIS — R7989 Other specified abnormal findings of blood chemistry: Secondary | ICD-10-CM

## 2021-02-13 DIAGNOSIS — I10 Essential (primary) hypertension: Secondary | ICD-10-CM | POA: Diagnosis present

## 2021-02-13 DIAGNOSIS — D696 Thrombocytopenia, unspecified: Secondary | ICD-10-CM | POA: Diagnosis not present

## 2021-02-13 DIAGNOSIS — Z7401 Bed confinement status: Secondary | ICD-10-CM | POA: Diagnosis not present

## 2021-02-13 DIAGNOSIS — D509 Iron deficiency anemia, unspecified: Secondary | ICD-10-CM | POA: Diagnosis present

## 2021-02-13 DIAGNOSIS — N182 Chronic kidney disease, stage 2 (mild): Secondary | ICD-10-CM | POA: Diagnosis present

## 2021-02-13 DIAGNOSIS — E1122 Type 2 diabetes mellitus with diabetic chronic kidney disease: Secondary | ICD-10-CM | POA: Diagnosis present

## 2021-02-13 DIAGNOSIS — Z91040 Latex allergy status: Secondary | ICD-10-CM

## 2021-02-13 DIAGNOSIS — I130 Hypertensive heart and chronic kidney disease with heart failure and stage 1 through stage 4 chronic kidney disease, or unspecified chronic kidney disease: Secondary | ICD-10-CM | POA: Diagnosis present

## 2021-02-13 DIAGNOSIS — J189 Pneumonia, unspecified organism: Secondary | ICD-10-CM | POA: Diagnosis present

## 2021-02-13 DIAGNOSIS — Z885 Allergy status to narcotic agent status: Secondary | ICD-10-CM

## 2021-02-13 DIAGNOSIS — I5032 Chronic diastolic (congestive) heart failure: Secondary | ICD-10-CM | POA: Diagnosis not present

## 2021-02-13 DIAGNOSIS — R509 Fever, unspecified: Secondary | ICD-10-CM | POA: Diagnosis not present

## 2021-02-13 DIAGNOSIS — E1151 Type 2 diabetes mellitus with diabetic peripheral angiopathy without gangrene: Secondary | ICD-10-CM | POA: Diagnosis not present

## 2021-02-13 DIAGNOSIS — R6520 Severe sepsis without septic shock: Secondary | ICD-10-CM | POA: Diagnosis not present

## 2021-02-13 DIAGNOSIS — Z7982 Long term (current) use of aspirin: Secondary | ICD-10-CM

## 2021-02-13 DIAGNOSIS — Z888 Allergy status to other drugs, medicaments and biological substances status: Secondary | ICD-10-CM

## 2021-02-13 DIAGNOSIS — B9620 Unspecified Escherichia coli [E. coli] as the cause of diseases classified elsewhere: Secondary | ICD-10-CM | POA: Diagnosis present

## 2021-02-13 DIAGNOSIS — R110 Nausea: Secondary | ICD-10-CM

## 2021-02-13 DIAGNOSIS — I248 Other forms of acute ischemic heart disease: Secondary | ICD-10-CM | POA: Diagnosis present

## 2021-02-13 DIAGNOSIS — E861 Hypovolemia: Secondary | ICD-10-CM | POA: Diagnosis not present

## 2021-02-13 DIAGNOSIS — E782 Mixed hyperlipidemia: Secondary | ICD-10-CM | POA: Diagnosis not present

## 2021-02-13 DIAGNOSIS — Z8673 Personal history of transient ischemic attack (TIA), and cerebral infarction without residual deficits: Secondary | ICD-10-CM

## 2021-02-13 DIAGNOSIS — R404 Transient alteration of awareness: Secondary | ICD-10-CM

## 2021-02-13 DIAGNOSIS — R296 Repeated falls: Secondary | ICD-10-CM | POA: Diagnosis present

## 2021-02-13 DIAGNOSIS — B9689 Other specified bacterial agents as the cause of diseases classified elsewhere: Secondary | ICD-10-CM | POA: Diagnosis present

## 2021-02-13 DIAGNOSIS — I779 Disorder of arteries and arterioles, unspecified: Secondary | ICD-10-CM | POA: Diagnosis present

## 2021-02-13 DIAGNOSIS — Z947 Corneal transplant status: Secondary | ICD-10-CM

## 2021-02-13 DIAGNOSIS — Z951 Presence of aortocoronary bypass graft: Secondary | ICD-10-CM

## 2021-02-13 DIAGNOSIS — R6889 Other general symptoms and signs: Secondary | ICD-10-CM | POA: Diagnosis not present

## 2021-02-13 DIAGNOSIS — E785 Hyperlipidemia, unspecified: Secondary | ICD-10-CM | POA: Diagnosis present

## 2021-02-13 DIAGNOSIS — Z20822 Contact with and (suspected) exposure to covid-19: Secondary | ICD-10-CM | POA: Diagnosis not present

## 2021-02-13 DIAGNOSIS — Z7984 Long term (current) use of oral hypoglycemic drugs: Secondary | ICD-10-CM

## 2021-02-13 DIAGNOSIS — I517 Cardiomegaly: Secondary | ICD-10-CM | POA: Diagnosis not present

## 2021-02-13 DIAGNOSIS — Z809 Family history of malignant neoplasm, unspecified: Secondary | ICD-10-CM

## 2021-02-13 DIAGNOSIS — I9589 Other hypotension: Secondary | ICD-10-CM | POA: Diagnosis not present

## 2021-02-13 DIAGNOSIS — F419 Anxiety disorder, unspecified: Secondary | ICD-10-CM

## 2021-02-13 DIAGNOSIS — I639 Cerebral infarction, unspecified: Secondary | ICD-10-CM | POA: Diagnosis present

## 2021-02-13 DIAGNOSIS — R0602 Shortness of breath: Secondary | ICD-10-CM

## 2021-02-13 DIAGNOSIS — R109 Unspecified abdominal pain: Secondary | ICD-10-CM | POA: Diagnosis not present

## 2021-02-13 DIAGNOSIS — R0689 Other abnormalities of breathing: Secondary | ICD-10-CM | POA: Diagnosis not present

## 2021-02-13 DIAGNOSIS — Z743 Need for continuous supervision: Secondary | ICD-10-CM | POA: Diagnosis not present

## 2021-02-13 DIAGNOSIS — K219 Gastro-esophageal reflux disease without esophagitis: Secondary | ICD-10-CM | POA: Diagnosis present

## 2021-02-13 DIAGNOSIS — I25118 Atherosclerotic heart disease of native coronary artery with other forms of angina pectoris: Secondary | ICD-10-CM | POA: Diagnosis not present

## 2021-02-13 DIAGNOSIS — R531 Weakness: Secondary | ICD-10-CM | POA: Diagnosis not present

## 2021-02-13 LAB — CBC WITH DIFFERENTIAL/PLATELET
Abs Immature Granulocytes: 0.08 10*3/uL — ABNORMAL HIGH (ref 0.00–0.07)
Basophils Absolute: 0 10*3/uL (ref 0.0–0.1)
Basophils Relative: 0 %
Eosinophils Absolute: 0 10*3/uL (ref 0.0–0.5)
Eosinophils Relative: 0 %
HCT: 35.9 % — ABNORMAL LOW (ref 36.0–46.0)
Hemoglobin: 11.3 g/dL — ABNORMAL LOW (ref 12.0–15.0)
Immature Granulocytes: 1 %
Lymphocytes Relative: 8 %
Lymphs Abs: 0.9 10*3/uL (ref 0.7–4.0)
MCH: 33.9 pg (ref 26.0–34.0)
MCHC: 31.5 g/dL (ref 30.0–36.0)
MCV: 107.8 fL — ABNORMAL HIGH (ref 80.0–100.0)
Monocytes Absolute: 1.2 10*3/uL — ABNORMAL HIGH (ref 0.1–1.0)
Monocytes Relative: 11 %
Neutro Abs: 8.6 10*3/uL — ABNORMAL HIGH (ref 1.7–7.7)
Neutrophils Relative %: 80 %
Platelets: 91 10*3/uL — ABNORMAL LOW (ref 150–400)
RBC: 3.33 MIL/uL — ABNORMAL LOW (ref 3.87–5.11)
RDW: 14.4 % (ref 11.5–15.5)
WBC: 10.8 10*3/uL — ABNORMAL HIGH (ref 4.0–10.5)
nRBC: 0 % (ref 0.0–0.2)

## 2021-02-13 LAB — URINALYSIS, ROUTINE W REFLEX MICROSCOPIC
Bilirubin Urine: NEGATIVE
Glucose, UA: NEGATIVE mg/dL
Ketones, ur: NEGATIVE mg/dL
Nitrite: NEGATIVE
Protein, ur: 100 mg/dL — AB
Specific Gravity, Urine: 1.017 (ref 1.005–1.030)
WBC, UA: >50 WBC/hpf — ABNORMAL HIGH (ref 0–5)
pH: 5 (ref 5.0–8.0)

## 2021-02-13 LAB — COMPREHENSIVE METABOLIC PANEL
ALT: 14 U/L (ref 0–44)
AST: 19 U/L (ref 15–41)
Albumin: 2.8 g/dL — ABNORMAL LOW (ref 3.5–5.0)
Alkaline Phosphatase: 85 U/L (ref 38–126)
Anion gap: 9 (ref 5–15)
BUN: 50 mg/dL — ABNORMAL HIGH (ref 8–23)
CO2: 23 mmol/L (ref 22–32)
Calcium: 8.6 mg/dL — ABNORMAL LOW (ref 8.9–10.3)
Chloride: 105 mmol/L (ref 98–111)
Creatinine, Ser: 2.07 mg/dL — ABNORMAL HIGH (ref 0.44–1.00)
GFR, Estimated: 24 mL/min — ABNORMAL LOW (ref >60)
Glucose, Bld: 140 mg/dL — ABNORMAL HIGH (ref 70–99)
Potassium: 4.1 mmol/L (ref 3.5–5.1)
Sodium: 137 mmol/L (ref 135–145)
Total Bilirubin: 1 mg/dL (ref 0.3–1.2)
Total Protein: 5.9 g/dL — ABNORMAL LOW (ref 6.5–8.1)

## 2021-02-13 LAB — I-STAT VENOUS BLOOD GAS, ED
Acid-Base Excess: 0 mmol/L (ref 0.0–2.0)
Bicarbonate: 26.8 mmol/L (ref 20.0–28.0)
Calcium, Ion: 1.13 mmol/L — ABNORMAL LOW (ref 1.15–1.40)
HCT: 35 % — ABNORMAL LOW (ref 36.0–46.0)
Hemoglobin: 11.9 g/dL — ABNORMAL LOW (ref 12.0–15.0)
O2 Saturation: 97 %
Potassium: 4.4 mmol/L (ref 3.5–5.1)
Sodium: 138 mmol/L (ref 135–145)
TCO2: 28 mmol/L (ref 22–32)
pCO2, Ven: 51.2 mmHg (ref 44.0–60.0)
pH, Ven: 7.327 (ref 7.250–7.430)
pO2, Ven: 95 mmHg — ABNORMAL HIGH (ref 32.0–45.0)

## 2021-02-13 LAB — LACTIC ACID, PLASMA
Lactic Acid, Venous: 1.9 mmol/L (ref 0.5–1.9)
Lactic Acid, Venous: 1.5 mmol/L (ref 0.5–1.9)

## 2021-02-13 LAB — RESP PANEL BY RT-PCR (FLU A&B, COVID) ARPGX2
Influenza A by PCR: NEGATIVE
Influenza B by PCR: NEGATIVE
SARS Coronavirus 2 by RT PCR: NEGATIVE

## 2021-02-13 LAB — PROTIME-INR
INR: 1.4 — ABNORMAL HIGH (ref 0.8–1.2)
Prothrombin Time: 16.7 seconds — ABNORMAL HIGH (ref 11.4–15.2)

## 2021-02-13 LAB — PROCALCITONIN: Procalcitonin: 85.46 ng/mL

## 2021-02-13 LAB — APTT: aPTT: 37 seconds — ABNORMAL HIGH (ref 24–36)

## 2021-02-13 LAB — BRAIN NATRIURETIC PEPTIDE: B Natriuretic Peptide: 1362.9 pg/mL — ABNORMAL HIGH (ref 0.0–100.0)

## 2021-02-13 LAB — TSH: TSH: 0.579 u[IU]/mL (ref 0.350–4.500)

## 2021-02-13 LAB — MAGNESIUM: Magnesium: 2 mg/dL (ref 1.7–2.4)

## 2021-02-13 LAB — AMMONIA: Ammonia: 18 umol/L (ref 9–35)

## 2021-02-13 LAB — VITAMIN B12: Vitamin B-12: 1042 pg/mL — ABNORMAL HIGH (ref 180–914)

## 2021-02-13 LAB — TROPONIN I (HIGH SENSITIVITY): Troponin I (High Sensitivity): 394 ng/L (ref <18)

## 2021-02-13 MED ORDER — ACETAMINOPHEN 650 MG RE SUPP
975.0000 mg | Freq: Once | RECTAL | Status: AC
  Administered 2021-02-13: 975 mg via RECTAL
  Filled 2021-02-13: qty 1

## 2021-02-13 MED ORDER — ENOXAPARIN SODIUM 30 MG/0.3ML IJ SOSY
30.0000 mg | PREFILLED_SYRINGE | INTRAMUSCULAR | Status: DC
  Administered 2021-02-14: 30 mg via SUBCUTANEOUS
  Filled 2021-02-13 (×2): qty 0.3

## 2021-02-13 MED ORDER — FUROSEMIDE 10 MG/ML IJ SOLN: 10.0000 mg | Freq: Once | INTRAMUSCULAR | Status: DC

## 2021-02-13 MED ORDER — LACTATED RINGERS IV SOLN: INTRAVENOUS | Status: DC

## 2021-02-13 MED ORDER — LACTATED RINGERS IV BOLUS (SEPSIS)
1000.0000 mL | Freq: Once | INTRAVENOUS | Status: AC
  Administered 2021-02-13: 1000 mL via INTRAVENOUS

## 2021-02-13 MED ORDER — SODIUM CHLORIDE 0.9 % IV SOLN: 2.0000 g | INTRAVENOUS | Status: DC

## 2021-02-13 MED ORDER — LACTATED RINGERS IV BOLUS (SEPSIS): 250.0000 mL | Freq: Once | INTRAVENOUS | Status: DC

## 2021-02-13 MED ORDER — VANCOMYCIN VARIABLE DOSE PER UNSTABLE RENAL FUNCTION (PHARMACIST DOSING): Status: DC

## 2021-02-13 MED ORDER — FUROSEMIDE 10 MG/ML IJ SOLN
40.0000 mg | Freq: Once | INTRAMUSCULAR | Status: AC
  Administered 2021-02-13: 40 mg via INTRAVENOUS
  Filled 2021-02-13: qty 4

## 2021-02-13 MED ORDER — ACETAMINOPHEN 650 MG RE SUPP: 650.0000 mg | Freq: Four times a day (QID) | RECTAL | Status: DC | PRN

## 2021-02-13 MED ORDER — METRONIDAZOLE 500 MG/100ML IV SOLN
500.0000 mg | Freq: Once | INTRAVENOUS | Status: AC
  Administered 2021-02-13: 500 mg via INTRAVENOUS
  Filled 2021-02-13: qty 100

## 2021-02-13 MED ORDER — CEFEPIME HCL 2 G IJ SOLR
2.0000 g | Freq: Once | INTRAMUSCULAR | Status: AC
Start: 2021-02-13 — End: 2021-02-13
  Administered 2021-02-13: 2 g via INTRAVENOUS
  Filled 2021-02-13: qty 2

## 2021-02-13 MED ORDER — HYDRALAZINE HCL 20 MG/ML IJ SOLN: 5.0000 mg | Freq: Three times a day (TID) | INTRAMUSCULAR | Status: DC | PRN

## 2021-02-13 MED ORDER — ACETAMINOPHEN 325 MG PO TABS
650.0000 mg | ORAL_TABLET | Freq: Four times a day (QID) | ORAL | Status: DC | PRN
  Administered 2021-02-14 – 2021-02-20 (×6): 650 mg via ORAL
  Filled 2021-02-13 (×6): qty 2

## 2021-02-13 MED ORDER — VANCOMYCIN HCL 1500 MG/300ML IV SOLN
1500.0000 mg | Freq: Once | INTRAVENOUS | Status: AC
  Administered 2021-02-13: 1500 mg via INTRAVENOUS
  Filled 2021-02-13: qty 300

## 2021-02-13 MED ORDER — HYDRALAZINE HCL 20 MG/ML IJ SOLN
5.0000 mg | INTRAMUSCULAR | Status: DC | PRN
  Administered 2021-02-13 – 2021-02-19 (×6): 5 mg via INTRAVENOUS
  Filled 2021-02-13 (×6): qty 1

## 2021-02-13 NOTE — ED Triage Notes (Signed)
Pt BIBA from [facility]. Pt has been altered since this morning. Pt is usually A&Ox 4. Pt was initially only responsive to pain.  Initial BP 74/60 manual with EMS Pt received 700 LR 102 tympanic.  No recent abx or UTI.   90/50 HR 70 99% 4L  172 CBG

## 2021-02-13 NOTE — Assessment & Plan Note (Addendum)
As on her hospital discharge in 04/2020 after a stroke she was discharged on home oxygen 2L  Currently on 2 L of oxygen per minute.  We will try to wean the oxygen

## 2021-02-13 NOTE — Assessment & Plan Note (Addendum)
Unclear etiology but most likely secondary to bacteremia/sepsis. Improving now

## 2021-02-13 NOTE — Assessment & Plan Note (Addendum)
Head CT with no acute finding today  Takes ASA/brillinta at home

## 2021-02-13 NOTE — Assessment & Plan Note (Addendum)
Mental status has improved -Metabolic encephalopathy secondary to sepsis -Head CT did not show any acute intracranial abnormalities. --Currently she is alert and oriented

## 2021-02-13 NOTE — ED Notes (Signed)
This RN notified attending of pt VS

## 2021-02-13 NOTE — Significant Event (Signed)
Patient was noticed to be getting increasingly short of breath and hypertensive.  On exam at bedside patient is encephalopathic but as per the nurse patient has become more alert than when she was admitted.  Blood pressure was 194/84 pulse 100/min temperature 99.1 respiration 38/min O2 sat 94% on 5 L.  Patient's oxygen levels were increased just recently for the hypoxia.  I ordered a stat chest x-ray which shows fluid congestion.  On exam patient also has elevated JVD.  Chest exam does not reveal any crackles or rhonchi.  I reviewed patient's notes labs and medications.  Assessment and plan -I suspect patient is developing fluid overload.  I discontinued patient's fluid and ordered 40 mg IV Lasix and BNP and troponins.  Troponin did come back at 394 and BNP of 1300.  Will order stat EKG.  Will trend cardiac markers check 2D echo.  We will continue to closely monitor.  Gean Birchwood

## 2021-02-13 NOTE — Sepsis Progress Note (Signed)
Sepsis protocol monitored by eLink 

## 2021-02-13 NOTE — Assessment & Plan Note (Signed)
Not on statin, no allergy unsure why

## 2021-02-13 NOTE — ED Notes (Signed)
Hospitalist at bedside 

## 2021-02-13 NOTE — ED Provider Notes (Signed)
[facility] Provider Note   CSN: [id number] Arrival date & time: 02/13/21  1039     History  Chief Complaint  Patient presents with   Altered Mental Status    Andrea Kirk is a 81 y.o. female.  HPI Patient seen by me at 10:55 AM.  She has presenting signs and symptoms of sepsis.  Sepsis bundle will be ordered.    Home Medications Prior to Admission medications   Medication Sig Start Date End Date Taking? Authorizing Provider  acetaminophen (TYLENOL) 325 MG tablet Take 650 mg by mouth every 6 (six) hours as needed for moderate pain.   Yes [provider]  Ascorbic Acid (VITAMIN C) 1000 MG tablet Take 1,000 mg by mouth daily.   Yes [provider]  aspirin EC 81 MG tablet Take 81 mg by mouth every evening.    Yes [provider]  busPIRone (BUSPAR) 7.5 MG tablet Take 7.5 mg by mouth 2 (two) times daily. 11/05/20  Yes [provider]  carvedilol (COREG) 12.5 MG tablet Take 1 tablet (12.5 mg total) by mouth 2 (two) times daily. 06/28/20 04/14/21 Yes Dessa Phi, DO  citalopram (CELEXA) 20 MG tablet Take 20 mg by mouth daily.   Yes [provider]  Cyanocobalamin (B-12) 1000 MCG TABS Take 1,000 mcg by mouth daily.   Yes [provider]  docusate sodium (COLACE) 100 MG capsule Take 100 mg by mouth 2 (two) times daily.   Yes [provider]  ferrous sulfate 325 (65 FE) MG tablet Take 325 mg by mouth daily.   Yes [provider]  furosemide (LASIX) 20 MG tablet Take 20 mg by mouth daily as needed for edema. 10/17/20  Yes [provider]  gabapentin (NEURONTIN) 300 MG capsule Take 300 mg by mouth 4 (four) times daily.  02/28/16  Yes [provider]  glipiZIDE (GLUCOTROL XL) 5 MG 24 hr tablet Take 1 tablet (5 mg total) by mouth daily with breakfast. 04/21/20  Yes Allie Bossier, MD  HYDROcodone-acetaminophen [facility]) 10-325 MG tablet Take 1 tablet by mouth in the  morning and at bedtime. Also takes 5/325mg  as needed for breakthrough pain   Yes [provider]  HYDROcodone-acetaminophen (NORCO/VICODIN) 5-325 MG tablet Take 1 tablet by mouth daily as needed (breakthrough pain). Also takes 10/325mg  twice daily scheduled   Yes [provider]  lidocaine (LIDODERM) 5 % Place 1 patch onto the skin daily. Apply to back and left leg for 12 hours per day for pain. Remove every morning. 11/08/20  Yes [provider]  Magnesium Oxide 250 MG TABS Take 250 mg by mouth daily.   Yes [provider]  meclizine (ANTIVERT) 25 MG tablet Take 25 mg by mouth 2 (two) times daily as needed for dizziness. For dizziness   Yes [provider]  melatonin 3 MG TABS tablet Take 3 mg by mouth at bedtime.   Yes [provider]  Menthol, Topical Analgesic, (BIOFREEZE) 4 % GEL Apply 1 application topically 3 (three) times daily as needed (bilateral leg pain).   Yes [provider]  methocarbamol (ROBAXIN) 500 MG tablet Take 500 mg by mouth 3 (three) times daily. 05/17/20  Yes [provider]  nitroGLYCERIN (NITROSTAT) 0.4 MG SL tablet Place 1 tablet (0.4 mg total) under the tongue every 5 (five) minutes as needed. Chest pain. Patient taking differently: Place 0.4 mg under the tongue every 5 (five) minutes as needed for chest pain. 01/18/19  Yes Jettie Booze, MD  nystatin (MYCOSTATIN/NYSTOP) powder Apply 1 application topically 2 (two) times daily as needed (rash).   Yes [provider]  oxybutynin (DITROPAN-XL) 5 MG 24 hr tablet Take 5 mg by mouth daily. 09/26/20  Yes [provider]  Polyethyl Glycol-Propyl Glycol (SYSTANE) 0.4-0.3 % SOLN Apply 1-2 drops to eye 4 (four) times daily as needed (dry eye).   Yes [provider]  polyethylene glycol powder (GLYCOLAX/MIRALAX) 17 GM/SCOOP powder Take 17 g by mouth daily as needed for mild constipation.   Yes [provider]  saccharomyces  boulardii (FLORASTOR) 250 MG capsule Take 250 mg by mouth daily.   Yes [provider]  ticagrelor (BRILINTA) 90 MG TABS tablet Take 1 tablet (90 mg total) by mouth 2 (two) times daily. 04/20/20  Yes Allie Bossier, MD      Allergies    Codeine, Latex, Morphine and related, Cyclobenzaprine, and Methocarbamol    Review of Systems   Review of Systems  Physical Exam Updated Vital Signs BP (!) 97/53    Pulse 67    Temp 99.1 F (37.3 C) (Oral)    Resp 18    Ht 5\' 5"  (1.651 m)    Wt 74.8 kg    SpO2 97%    BMI 27.46 kg/m  Physical Exam Vitals and nursing note reviewed.  Constitutional:      General: She is in acute distress.     Appearance: She is well-developed. She is obese. She is ill-appearing and toxic-appearing. She is not diaphoretic.  HENT:     Head: Normocephalic and atraumatic.     Right Ear: External ear normal.     Left Ear: External ear normal.     Mouth/Throat:     Mouth: Mucous membranes are dry.     Pharynx: No oropharyngeal exudate.  Eyes:     Conjunctiva/sclera: Conjunctivae normal.     Pupils: Pupils are equal, round, and reactive to light.  Neck:     Trachea: Phonation normal.  Cardiovascular:     Rate and Rhythm: Normal rate and regular rhythm.     Heart sounds: Normal heart sounds.     Comments: Hypotensive Pulmonary:     Effort: Pulmonary effort is normal.     Breath sounds: Normal breath sounds.  Abdominal:     General: There is no distension.     Palpations: Abdomen is soft.     Tenderness: There is no abdominal tenderness.  Musculoskeletal:        General: Normal range of motion.     Cervical back: Normal range of motion and neck supple.  Skin:    General: Skin is warm and dry.     Coloration: Skin is not jaundiced.     Findings: No rash.  Neurological:     GCS: GCS eye subscore is 1. GCS verbal subscore is 3. GCS motor subscore is 5.     Cranial Nerves: No cranial nerve deficit.     Motor: No abnormal muscle tone.     Comments: Fever  present.  Psychiatric:     Comments: Obtunded    ED Results / Procedures / Treatments   Labs (all labs ordered are listed, but only abnormal results are displayed) Labs Reviewed  COMPREHENSIVE METABOLIC PANEL - Abnormal; Notable for the following components:      Result Value   Glucose, Bld 140 (*)    BUN 50 (*)    Creatinine, Ser 2.07 (*)  Calcium 8.6 (*)    Total Protein 5.9 (*)    Albumin 2.8 (*)    GFR, Estimated 24 (*)    All other components within normal limits  CBC WITH DIFFERENTIAL/PLATELET - Abnormal; Notable for the following components:   WBC 10.8 (*)    RBC 3.33 (*)    Hemoglobin 11.3 (*)    HCT 35.9 (*)    MCV 107.8 (*)    Platelets 91 (*)    Neutro Abs 8.6 (*)    Monocytes Absolute 1.2 (*)    Abs Immature Granulocytes 0.08 (*)    All other components within normal limits  PROTIME-INR - Abnormal; Notable for the following components:   Prothrombin Time 16.7 (*)    INR 1.4 (*)    All other components within normal limits  APTT - Abnormal; Notable for the following components:   aPTT 37 (*)    All other components within normal limits  URINALYSIS, ROUTINE W REFLEX MICROSCOPIC - Abnormal; Notable for the following components:   Color, Urine AMBER (*)    APPearance TURBID (*)    Hgb urine dipstick SMALL (*)    Protein, ur 100 (*)    Leukocytes,Ua MODERATE (*)    WBC, UA >50 (*)    Bacteria, UA MANY (*)    All other components within normal limits  RESP PANEL BY RT-PCR (FLU A&B, COVID) ARPGX2  CULTURE, BLOOD (ROUTINE X 2)  CULTURE, BLOOD (ROUTINE X 2)  URINE CULTURE  LACTIC ACID, PLASMA  LACTIC ACID, PLASMA  PROCALCITONIN    EKG EKG Interpretation  Date/Time:  Tuesday February 13 2021 11:03:27 EST Ventricular Rate:  72 PR Interval:  164 QRS Duration: 117 QT Interval:  527 QTC Calculation: 577 R Axis:   86 Text Interpretation: Sinus rhythm Incomplete right bundle branch block Abnormal inferior Q waves Nonspecific repol abnormality, lateral  leads since last tracing no significant change Confirmed by Daleen Bo [phone]) on 02/13/2021 1:01:09 PM  Radiology DG Chest Port 1 View  Result Date: 02/13/2021 CLINICAL DATA:  Question sepsis EXAM: PORTABLE CHEST 1 VIEW COMPARISON:  06/23/2020 FINDINGS: Postop CABG. Heart size upper normal. Mild vascular congestion without edema. Mild patchy bibasilar airspace disease may be atelectasis or pneumonia. Small right pleural effusion. IMPRESSION: Mild bibasilar atelectasis or pneumonia. Vascular congestion without edema.  Small right effusion. Electronically Signed   By: Franchot Gallo M.D.   On: 02/13/2021 11:28    Procedures Procedures    Medications Ordered in ED Medications  lactated ringers infusion (has no administration in time range)  lactated ringers bolus 1,000 mL (0 mLs Intravenous Stopped 02/13/21 1232)    And  lactated ringers bolus 1,000 mL (1,000 mLs Intravenous New Bag/Given 02/13/21 1233)    And  lactated ringers bolus 250 mL (has no administration in time range)  vancomycin (VANCOREADY) IVPB 1500 mg/300 mL (has no administration in time range)  vancomycin variable dose per unstable renal function (pharmacist dosing) (has no administration in time range)  ceFEPIme (MAXIPIME) 2 g in sodium chloride 0.9 % 100 mL IVPB (has no administration in time range)  metroNIDAZOLE (FLAGYL) IVPB 500 mg (0 mg Intravenous Stopped 02/13/21 1230)  acetaminophen (TYLENOL) suppository 975 mg (975 mg Rectal Given 02/13/21 1126)  ceFEPIme (MAXIPIME) 2 g in sodium chloride 0.9 % 100 mL IVPB (0 g Intravenous Stopped 02/13/21 1304)    ED Course/ Medical Decision Making/ A&P Clinical Course as of 02/13/21 1309  Tue Feb 13, 2021  1251 The patient is now  alert and responsive but remains confused. [EW]    Clinical Course User Index [EW] Daleen Bo, MD                           Medical Decision Making Patient presenting with altered mental status and lethargy, minimally responsive on arrival.  During  the initial evaluation, the patient awoke and began mentating more normally after urinary catheterization.  Problems Addressed: AKI (acute kidney injury) ([place]): undiagnosed new problem with uncertain prognosis    Details: Onset after UTI Transient alteration of awareness: undiagnosed new problem with uncertain prognosis    Details: Associated with acute infectious process Urinary tract infection without hematuria, site unspecified: undiagnosed new problem with uncertain prognosis    Details: Likely cause of altered mental status  Amount and/or Complexity of Data Reviewed Independent Historian: caregiver    Details: I was able to reach the patient's brother, a physician in [place].  He states that she commonly gets UTIs but usually they do not affect her quite this badly.  I discussed the anticipated treatment and the requirement for hospitalization. External Data Reviewed: labs and notes.    Details: Prior visit with neurosurgery from 2 months ago noted.  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  No other recent evaluations in the EMR.  Prior labs indicate normal creatinine.  She is in acute renal failure today. Labs: ordered.    Details: CBC, metabolic panel, blood cultures, lactate, urinalysis, urine culture.-Evaluation consistent with acute UTI.  Lactate normal indicating no sepsis.  Creatinine higher than baseline indicating acute renal failure/AKI.  This will require management with IV fluids and close monitoring. Radiology: ordered and independent interpretation performed.    Details: Chest x-ray-no pneumonia, pneumothorax or heart failure ECG/medicine tests: ordered.    Details: Cardiac monitor with normal sinus rhythm.  Prior EKG from 1122 indicating stability, no change today. Discussion of management or test interpretation with external provider(s): Consult hospitalist for admission.  They are agreeable.  Risk Prescription drug management. Decision regarding  hospitalization. Risk Details: Patient presented with altered mental status, found to have UTI and acute kidney injury.  She did not recover immediately.  She will require hospitalization for management of the symptoms.  Doubt severe sepsis, or hemodynamic instability.  Elevated creatinine likely due to malaise with UTI, which is being managed with IV fluids.  Empiric antibiotics started to treat UTI, parenterally.  Patient is critically ill and requires hospitalization for management.  Critical Care Total time providing critical care: 30-74 minutes          Final Clinical Impression(s) / ED Diagnoses Final diagnoses:  Transient alteration of awareness  Urinary tract infection without hematuria, site unspecified  AKI (acute kidney injury) ([place])  Hypotension due to hypovolemia    Rx / DC Orders ED Discharge Orders     None         Daleen Bo, MD 02/13/21 1310

## 2021-02-13 NOTE — Assessment & Plan Note (Addendum)
On buspar/celexa We will resume on DC

## 2021-02-13 NOTE — Assessment & Plan Note (Addendum)
AKI has resolved

## 2021-02-13 NOTE — Assessment & Plan Note (Addendum)
-  She was initially started on IV fluids now stopped.  Elevated BNP.  Chest x-ray showed vascular congestion -Echo done during this hospitalization showed EF of 50-55% with grade 3 diastolic dysfunction, Severely elevated pulmonary artery pressure. -intake/output -Given IV Lasix 1 dose, now  held -Cardiology was consulted

## 2021-02-13 NOTE — Assessment & Plan Note (Addendum)
On aspirin and Brilinta at home. hemoglobin is at baseline.  We will resume these meds as platelets level have improved

## 2021-02-13 NOTE — Assessment & Plan Note (Deleted)
Likely prerenal in setting of sepsis Continue IVF Hold nephrotoxic drugs I/O Trend bmp

## 2021-02-13 NOTE — H&P (Signed)
History and Physical    Patient: Andrea Kirk NUU:[id number] DOB: Mar 07, 1940 DOA: 02/13/2021 DOS: the patient was seen and examined on 02/13/2021 PCP: Aura Dials, MD  Patient coming from: [facility]   Chief Complaint: AMS  HPI: Andrea Kirk is a 81 y.o. female with medical history significant of hx of CVA, carotid artery disease with CABG, diastolic CHF, H4VQ, HTN, HLD, iron deficiency anemia, depression CKD stage 2 who presented from her SNF due to altered mental status. She is sleepy, easily awakes and talks, but then goes back to sleep. She knows she is in the hospital. She states they sent her over here to figure out why she is so sleepy. Per chart notes has been altered since this AM, initially only responsive to pain.   She is able to tell me she has had no dysuria, increased frequency and urgency. She typically does not wear oxygen at home. She will not answer any other questions.   I talked to her brother, Dr. Sandy, who states he talked to her about one week ago and she was at her baseline. He was told this AM that she was altered and very sleepy. No fall. He also states her oxygen was low per the SNF. Placed on 4L oxygen.   ER Course:  vitals: temp: 101, bp: 97/47, HR: 61, RR: 16, oxygen: 86%RA Pertinent labs: BUN: 50, creatinine: 2.07, WBC: 10.8, hgb: 11.3, platelets: 91,  Lactic acid: 1.9, UA: moderate LE, protein, >50WBC CXR: mild bibasilar atelectasis or pneumonia. Vascular congestion with edema. Small right pleural effusion.  In ED code sepsis activated. Given 2L +249ml and maintenance IVF started on broad spectrum abx. Received 711ml IVF with EMS.    Review of Systems: unable to review all systems due to the inability of the patient to answer questions. Past Medical History:  Diagnosis Date   Acute kidney injury ([place]) 08/05/2016   Aftercare following surgery of the circulatory system, NEC 05/11/2013   AKI (acute kidney injury) ([place]) 08/04/2016   Anxiety    takes  Celexa daily   ARF (acute renal failure) ([place]) 11/07/2016   Asymptomatic stenosis of right carotid artery 03/22/2016   Back pain    occasionally   Carotid artery disease ([place]) 04/16/2013   Carotid artery occlusion    Carotid stenosis 11/16/2013   Cataract    left and immature   Coronary artery disease    Coronary atherosclerosis of native coronary artery 03/11/2013   S/p CABG in 1997    Depression    Diabetes mellitus    takes Metformin and Glipizide daily   Diabetes mellitus ([place]) 05/02/2015   Dizziness    takes Meclizine daily as needed   Essential hypertension, benign 03/11/2013   GERD (gastroesophageal reflux disease)    takes Omeprazole daily as needed   Headache(784.0)    Hyperlipidemia    takes Atorvastatin daily   Hypertension    takes Carvedilol daily   Mixed hyperlipidemia 03/11/2013   Muscle spasm    takes Robaxin daily as needed   Nausea    takes Phenergan daily as needed   Occlusion and stenosis of carotid artery without mention of cerebral infarction 07/19/2011   Pneumonia    hx of-in high school   Restless leg    takes Requip daily as needed   Seasonal allergies    takes Claritin daily as needed and Afrin as needed   Shortness of breath    with exertion   Urinary urgency  UTI (urinary tract infection) 11/07/2016   Past Surgical History:  Procedure Laterality Date   COLONOSCOPY WITH PROPOFOL N/A 03/10/2012   Procedure: COLONOSCOPY WITH PROPOFOL;  Surgeon: Garlan Fair, MD;  Location: [facility];  Service: Endoscopy;  Laterality: N/A;   CORNEAL TRANSPLANT Right    CORONARY ARTERY BYPASS GRAFT  1997   x 6   CORONARY ARTERY BYPASS GRAFT  Jan. 1997   ENDARTERECTOMY Left 04/16/2013   Procedure: Left Carotid Artery Endatarectomy with Resection of Redundant Internal Carotid Artery;  Surgeon: Rosetta Posner, MD;  Location: [place];  Service: Vascular;  Laterality: Left;   ENDARTERECTOMY Right 03/22/2016   Procedure: RIGHT ENDARTERECTOMY CAROTID;  Surgeon: Rosetta Posner, MD;  Location: [facility] OR;  Service: Vascular;  Laterality: Right;   ESOPHAGOGASTRODUODENOSCOPY N/A 03/10/2012   Procedure: ESOPHAGOGASTRODUODENOSCOPY (EGD);  Surgeon: Garlan Fair, MD;  Location: [facility];  Service: Endoscopy;  Laterality: N/A;   EYE SURGERY  March 12, 2001   CORNEA TRANSPLANT Right eye   PATCH ANGIOPLASTY Right 03/22/2016   Procedure: PATCH ANGIOPLASTY;  Surgeon: Rosetta Posner, MD;  Location: [place];  Service: Vascular;  Laterality: Right;   [place]  march 2013   Back surgery   TONSILLECTOMY     TRIGGER FINGER RELEASE Left    thumb   Social History:  reports that she has never smoked. She has never used smokeless tobacco. She reports that she does not drink alcohol and does not use drugs.  Allergies  Allergen Reactions   Codeine Other (See Comments)    Abnormal behavior    Latex Rash and Other (See Comments)    tears skin    Morphine And Related Other (See Comments)    Affects BP and blood sugar.   Cyclobenzaprine     MADE PT SICK- pt unsure if med was cyclobenzaprine or methocarbamol    Methocarbamol Other (See Comments)    MADE PT SICK- pt unsure if med was cyclobenzaprine or methocarbamol  11/09/20 pt currently takes methocarbamol    Family History  Problem Relation Age of Onset   Heart attack Father    Heart disease Father 47       Before age of 57   Hyperlipidemia Father    Heart disease Brother        Heart dissease before age 66   Hyperlipidemia Brother    Cirrhosis Mother    Heart attack Paternal Grandfather    Heart disease Paternal Grandfather    Cancer Maternal Grandmother    Alcoholism Maternal Grandfather    Heart attack Paternal Grandmother     Prior to Admission medications   Medication Sig Start Date End Date Taking? Authorizing Provider  acetaminophen (TYLENOL) 325 MG tablet Take 650 mg by mouth every 6 (six) hours as needed for moderate pain.   Yes [provider]   Ascorbic Acid (VITAMIN C) 1000 MG tablet Take 1,000 mg by mouth daily.   Yes [provider]  aspirin EC 81 MG tablet Take 81 mg by mouth every evening.    Yes [provider]  busPIRone (BUSPAR) 7.5 MG tablet Take 7.5 mg by mouth 2 (two) times daily. 11/05/20  Yes [provider]  carvedilol (COREG) 12.5 MG tablet Take 1 tablet (12.5 mg total) by mouth 2 (two) times daily. 06/28/20 04/14/21 Yes Dessa Phi, DO  citalopram (CELEXA) 20 MG tablet Take 20 mg by mouth daily.   Yes [provider]  Cyanocobalamin (B-12) 1000 MCG TABS Take 1,000 mcg by mouth daily.   Yes [provider]  docusate sodium (COLACE) 100 MG capsule Take 100 mg by mouth 2 (two) times daily.   Yes [provider]  ferrous sulfate 325 (65 FE) MG tablet Take 325 mg by mouth daily.   Yes [provider]  furosemide (LASIX) 20 MG tablet Take 20 mg by mouth daily as needed for edema. 10/17/20  Yes [provider]  gabapentin (NEURONTIN) 300 MG capsule Take 300 mg by mouth 4 (four) times daily.  02/28/16  Yes [provider]  glipiZIDE (GLUCOTROL XL) 5 MG 24 hr tablet Take 1 tablet (5 mg total) by mouth daily with breakfast. 04/21/20  Yes Allie Bossier, MD  HYDROcodone-acetaminophen [facility]) 10-325 MG tablet Take 1 tablet by mouth in the morning and at bedtime. Also takes 5/325mg  as needed for breakthrough pain   Yes [provider]  HYDROcodone-acetaminophen (NORCO/VICODIN) 5-325 MG tablet Take 1 tablet by mouth daily as needed (breakthrough pain). Also takes 10/325mg  twice daily scheduled   Yes [provider]  lidocaine (LIDODERM) 5 % Place 1 patch onto the skin daily. Apply to back and left leg for 12 hours per day for pain. Remove every morning. 11/08/20  Yes [provider]  Magnesium Oxide 250 MG TABS Take 250 mg by mouth daily.   Yes [provider]  meclizine (ANTIVERT) 25 MG tablet Take 25 mg by mouth 2 (two)  times daily as needed for dizziness. For dizziness   Yes [provider]  melatonin 3 MG TABS tablet Take 3 mg by mouth at bedtime.   Yes [provider]  Menthol, Topical Analgesic, (BIOFREEZE) 4 % GEL Apply 1 application topically 3 (three) times daily as needed (bilateral leg pain).   Yes [provider]  methocarbamol (ROBAXIN) 500 MG tablet Take 500 mg by mouth 3 (three) times daily. 05/17/20  Yes [provider]  nitroGLYCERIN (NITROSTAT) 0.4 MG SL tablet Place 1 tablet (0.4 mg total) under the tongue every 5 (five) minutes as needed. Chest pain. Patient taking differently: Place 0.4 mg under the tongue every 5 (five) minutes as needed for chest pain. 01/18/19  Yes Jettie Booze, MD  nystatin (MYCOSTATIN/NYSTOP) powder Apply 1 application topically 2 (two) times daily as needed (rash).   Yes [provider]  oxybutynin (DITROPAN-XL) 5 MG 24 hr tablet Take 5 mg by mouth daily. 09/26/20  Yes [provider]  Polyethyl Glycol-Propyl Glycol (SYSTANE) 0.4-0.3 % SOLN Apply 1-2 drops to eye 4 (four) times daily as needed (dry eye).   Yes [provider]  polyethylene glycol powder (GLYCOLAX/MIRALAX) 17 GM/SCOOP powder Take 17 g by mouth daily as needed for mild constipation.   Yes [provider]  saccharomyces boulardii (FLORASTOR) 250 MG capsule Take 250 mg by mouth daily.   Yes [provider]  ticagrelor (BRILINTA) 90 MG TABS tablet Take 1 tablet (90 mg total) by mouth 2 (two) times daily. 04/20/20  Yes Allie Bossier, MD    Physical Exam: Vitals:   02/13/21 1800 02/13/21 1815 02/13/21 1847 02/13/21 1900  BP: 136/72 (!) 142/66 (!) 142/81 (!) 166/87  Pulse: 74 66 76 79  Resp: (!) 23 19 16  (!) 21  Temp:      TempSrc:      SpO2: 96% 100% 99% 99%  Weight:      Height:       General:  Appears  calm and comfortable and is in NAD. Sleeping/drowsy.  Eyes:  PERRL, EOMI, normal lids, iris ENT:  grossly normal  hearing, lips & tongue, mmm; appropriate dentition Neck:  no LAD, masses or thyromegaly; no carotid bruits Cardiovascular:  RRR, no m/r/g. No LE edema.  Respiratory:   CTA bilaterally with no wheezes/rales/rhonchi.  Normal respiratory effort. Abdomen:  soft, NT, ND, NABS Back:   normal alignment, no CVAT Skin:  no rash or induration seen on limited exam Musculoskeletal:  grossly normal tone BUE/BLE, good ROM, no bony abnormality Lower extremity:  No LE edema.  Limited foot exam with no ulcerations.  2+ distal pulses. Psychiatric:  cant not assess. Does know she is in the hospital and who she is.  Neurologic:  can not assess.    Radiological Exams on Admission: Independently reviewed - see discussion in A/P where applicable  CT HEAD WO CONTRAST (5MM)  Result Date: 02/13/2021 CLINICAL DATA:  Altered mental status. EXAM: CT HEAD WITHOUT CONTRAST TECHNIQUE: Contiguous axial images were obtained from the base of the skull through the vertex without intravenous contrast. RADIATION DOSE REDUCTION: This exam was performed according to the departmental dose-optimization program which includes automated exposure control, adjustment of the mA and/or kV according to patient size and/or use of iterative reconstruction technique. COMPARISON:  CT head dated November 09, 2020. FINDINGS: Brain: No evidence of acute infarction, hemorrhage, hydrocephalus, extra-axial collection or mass lesion/mass effect. Stable mild chronic microvascular ischemic changes. Vascular: Calcified atherosclerosis at the skull base. No hyperdense vessel. Skull: Negative for fracture or focal lesion. Sinuses/Orbits: No acute finding. Other: None. IMPRESSION: 1. No acute intracranial abnormality. Electronically Signed   By: Titus Dubin M.D.   On: 02/13/2021 15:31   DG Chest Port 1 View  Result Date: 02/13/2021 CLINICAL DATA:  Question sepsis EXAM: PORTABLE CHEST 1 VIEW COMPARISON:  06/23/2020 FINDINGS: Postop CABG. Heart size upper  normal. Mild vascular congestion without edema. Mild patchy bibasilar airspace disease may be atelectasis or pneumonia. Small right pleural effusion. IMPRESSION: Mild bibasilar atelectasis or pneumonia. Vascular congestion without edema.  Small right effusion. Electronically Signed   By: Franchot Gallo M.D.   On: 02/13/2021 11:28    EKG: Independently reviewed.  NSR with rate 72, RBBB; nonspecific ST changes with no evidence of acute ischemia   Labs on Admission: I have personally reviewed the available labs and imaging studies at the time of the admission.  Pertinent labs:   BUN: 50,  creatinine: 2.07,  WBC: 10.8,  hgb: 11.3,  platelets: 91,  Lactic acid: 1.9,  UA: moderate LE, protein, >50WBC  Assessment and Plan: * Acute encephalopathy 81 year old female presenting with AMS and sepsis -admit to progressive -protecting her airway  -appears likely secondary to UTI, but also questionable pneumonia on CXR. [place] abx to vanc/cefipime. Checking procalcitonin. Blood cultures pending  -aspiration precautions  -check head CT with hx of CVA -will check metabolic labs, even though infectious etiology seems to be source -check vbg to make sure not retaining CO2-wnl -she is on a lot of medication that could also be contributing to her weakness/sleepiness: buspar/celexa/gabapentin/norco/robaxin-holding these for now with AMS  Acute respiratory failure with hypoxia ([place])- (present on admission) ? If chronic as on her hospital discharge in 04/2020 after a stroke she was discharged on home oxygen 2L Her brother states she was weaned off of this.  Requiring 4L with EMS, 2L here which could be her baseline Treating pneumonia, not retaining CO2, clinically appears dry and not volume  overloaded   Acute renal failure superimposed on stage 2 chronic kidney disease ([place])- (present on admission) Likely prerenal in setting of sepsis Continue IVF Hold nephrotoxic drugs I/O Asked for bladder scan if  limited urine output  Trend bmp   Essential hypertension- (present on admission) Hypotensive in setting of sepsis Hold home antihypertensives: coreg/lasix   Chronic diastolic CHF (congestive heart failure) ([place])- (present on admission) Does not appear fluid overloaded at this time -has received 2.7L IVF and will decrease rate/time limit maintenance. Her BP is responding nicely and do not want to overload her Echo in 04/2020 showed EF of 50-55% with grade 2 diastolic dysfunction. Mildly reduced right VF. Severely elevated pulmonary artery pressure.  -intake/output -she takes lasix prn -holding coreg with hypotension   Controlled type 2 diabetes mellitus with hyperglycemia ([facility])- (present on admission) No recent labs Check a1c and hold glipizide SSI and routine accuchecks   hx of CVA (cerebral vascular accident) ([place])- (present on admission) Head CT with no acute finding today  Continue ASA/brillinta   HLD (hyperlipidemia)- (present on admission) Not on statin, no allergy unsure why   Carotid artery disease ([place])- (present on admission) Continue ASA/Brillinta   Iron deficiency anemia- (present on admission) At her baseline continue to monitor   Thrombocytopenia ([place])- (present on admission) Platelets of 91, ? If sepsis contributing INR 1.4.  Continue to monitor   Anxiety and depression On buspar/celexa Continue once alert and tolerating PO     Advance Care Planning:   Code Status: Full Code   Consults: none   DVT Prophylaxis: lovenox   Family Communication: brother : Dr. Jori Moll Lamay   Severity of Illness: The appropriate patient status for this patient is INPATIENT. Inpatient status is judged to be reasonable and necessary in order to provide the required intensity of service to ensure the patient's safety. The patient's presenting symptoms, physical exam findings, and initial radiographic and laboratory data in the context of their chronic comorbidities is felt to  place them at high risk for further clinical deterioration. Furthermore, it is not anticipated that the patient will be medically stable for discharge from the hospital within 2 midnights of admission.   * I certify that at the point of admission it is my clinical judgment that the patient will require inpatient hospital care spanning beyond 2 midnights from the point of admission due to high intensity of service, high risk for further deterioration and high frequency of surveillance required.*  Author: Orma Flaming, MD 02/13/2021 7:45 PM  For on call review [email or web address].

## 2021-02-13 NOTE — Assessment & Plan Note (Addendum)
No recent labs Takes  Glipizide, currently on hold SSI and routine accuchecks

## 2021-02-13 NOTE — Assessment & Plan Note (Addendum)
Restarted home Coreg Continue as needed medications for severe hypertension.  Currently she is normotensive

## 2021-02-13 NOTE — Progress Notes (Signed)
Pharmacy Antibiotic Note  Kelvin Burpee Winstanley is a 81 y.o. female admitted on 02/13/2021 presenting with AMS, concern for sepsis.  Pharmacy has been consulted for vancomycin and cefepime dosing.  Plan: Vancomycin 1500 mg IV x 1, and then variable dosing d/t unstable renal function Cefepime 2g IV q 24h Monitor renal function, Cx and clinical progression to narrow Vancomycin levels as needed  Height: 5\' 5"  (165.1 cm) Weight: 74.8 kg (165 lb) IBW/kg (Calculated) : 57  Temp (24hrs), Avg:101 F (38.3 C), Min:101 F (38.3 C), Max:101 F (38.3 C)  Recent Labs  Lab 02/13/21 1103 02/13/21 1104  WBC 10.8*  --   CREATININE 2.07*  --   LATICACIDVEN  --  1.9    Estimated Creatinine Clearance: 22.3 mL/min (A) (by C-G formula based on SCr of 2.07 mg/dL (H)).    Allergies  Allergen Reactions   Codeine Other (See Comments)    Abnormal behavior    Latex Rash and Other (See Comments)    tears skin    Morphine And Related Other (See Comments)    Affects BP and blood sugar.   Cyclobenzaprine     MADE PT SICK- pt unsure if med was cyclobenzaprine or methocarbamol    Methocarbamol Other (See Comments)    MADE PT SICK- pt unsure if med was cyclobenzaprine or methocarbamol  11/09/20 pt currently takes methocarbamol    Bertis Ruddy, PharmD Clinical Pharmacist ED Pharmacist Phone # [phone] 02/13/2021 12:24 PM

## 2021-02-13 NOTE — ED Notes (Signed)
Pt noted to have increase work of breathing, increase RR. Pt reports feeling SHOB, This RN notified attending.

## 2021-02-13 NOTE — ED Notes (Signed)
Patient transported to CT 

## 2021-02-13 NOTE — Assessment & Plan Note (Addendum)
There was report of passage of dark stool.  FOBT was positive.  On checking her previous lab works, her hemoglobin is in the range of 10.  Currently hemoglobin at baseline.  No further work-up planned as patient has C. difficile

## 2021-02-14 ENCOUNTER — Inpatient Hospital Stay (HOSPITAL_COMMUNITY): Payer: Medicare Other

## 2021-02-14 ENCOUNTER — Other Ambulatory Visit: Payer: Self-pay

## 2021-02-14 DIAGNOSIS — R778 Other specified abnormalities of plasma proteins: Secondary | ICD-10-CM

## 2021-02-14 DIAGNOSIS — I248 Other forms of acute ischemic heart disease: Secondary | ICD-10-CM

## 2021-02-14 DIAGNOSIS — I5033 Acute on chronic diastolic (congestive) heart failure: Secondary | ICD-10-CM

## 2021-02-14 DIAGNOSIS — E782 Mixed hyperlipidemia: Secondary | ICD-10-CM

## 2021-02-14 DIAGNOSIS — N179 Acute kidney failure, unspecified: Secondary | ICD-10-CM

## 2021-02-14 DIAGNOSIS — A419 Sepsis, unspecified organism: Secondary | ICD-10-CM

## 2021-02-14 DIAGNOSIS — R7881 Bacteremia: Secondary | ICD-10-CM

## 2021-02-14 DIAGNOSIS — R7989 Other specified abnormal findings of blood chemistry: Secondary | ICD-10-CM

## 2021-02-14 DIAGNOSIS — G9340 Encephalopathy, unspecified: Secondary | ICD-10-CM

## 2021-02-14 DIAGNOSIS — I25118 Atherosclerotic heart disease of native coronary artery with other forms of angina pectoris: Secondary | ICD-10-CM

## 2021-02-14 HISTORY — DX: Sepsis, unspecified organism: A41.9

## 2021-02-14 LAB — BASIC METABOLIC PANEL
Anion gap: 13 (ref 5–15)
BUN: 52 mg/dL — ABNORMAL HIGH (ref 8–23)
CO2: 20 mmol/L — ABNORMAL LOW (ref 22–32)
Calcium: 9.2 mg/dL (ref 8.9–10.3)
Chloride: 102 mmol/L (ref 98–111)
Creatinine, Ser: 1.67 mg/dL — ABNORMAL HIGH (ref 0.44–1.00)
GFR, Estimated: 31 mL/min — ABNORMAL LOW (ref >60)
Glucose, Bld: 181 mg/dL — ABNORMAL HIGH (ref 70–99)
Potassium: 4.1 mmol/L (ref 3.5–5.1)
Sodium: 135 mmol/L (ref 135–145)

## 2021-02-14 LAB — BLOOD CULTURE ID PANEL (REFLEXED) - BCID2

## 2021-02-14 LAB — CBC
HCT: 41.2 % (ref 36.0–46.0)
Hemoglobin: 13.5 g/dL (ref 12.0–15.0)
MCH: 35 pg — ABNORMAL HIGH (ref 26.0–34.0)
MCHC: 32.8 g/dL (ref 30.0–36.0)
MCV: 106.7 fL — ABNORMAL HIGH (ref 80.0–100.0)
Platelets: 86 10*3/uL — ABNORMAL LOW (ref 150–400)
RBC: 3.86 MIL/uL — ABNORMAL LOW (ref 3.87–5.11)
RDW: 14.6 % (ref 11.5–15.5)
WBC: 11 10*3/uL — ABNORMAL HIGH (ref 4.0–10.5)
nRBC: 0 % (ref 0.0–0.2)

## 2021-02-14 LAB — OCCULT BLOOD X 1 CARD TO LAB, STOOL: Fecal Occult Bld: POSITIVE — AB

## 2021-02-14 LAB — PROCALCITONIN: Procalcitonin: 89.99 ng/mL

## 2021-02-14 LAB — MRSA NEXT GEN BY PCR, NASAL: MRSA by PCR Next Gen: NOT DETECTED

## 2021-02-14 LAB — TROPONIN I (HIGH SENSITIVITY): Troponin I (High Sensitivity): 637 ng/L (ref <18)

## 2021-02-14 MED ORDER — MORPHINE SULFATE (PF) 2 MG/ML IV SOLN
2.0000 mg | INTRAVENOUS | Status: DC | PRN
Start: 2021-02-14 — End: 2021-02-17
  Administered 2021-02-14 – 2021-02-17 (×8): 2 mg via INTRAVENOUS
  Filled 2021-02-14 (×8): qty 1

## 2021-02-14 MED ORDER — ASPIRIN 300 MG RE SUPP
300.0000 mg | Freq: Every day | RECTAL | Status: DC
  Administered 2021-02-14: 300 mg via RECTAL
  Filled 2021-02-14: qty 1

## 2021-02-14 MED ORDER — SODIUM CHLORIDE 0.9 % IV SOLN
2.0000 g | INTRAVENOUS | Status: DC
  Administered 2021-02-14 – 2021-02-15 (×2): 2 g via INTRAVENOUS
  Filled 2021-02-14 (×3): qty 20

## 2021-02-14 MED ORDER — LABETALOL HCL 5 MG/ML IV SOLN: 10.0000 mg | INTRAVENOUS | Status: DC | PRN

## 2021-02-14 MED ORDER — CARVEDILOL 12.5 MG PO TABS
12.5000 mg | ORAL_TABLET | Freq: Two times a day (BID) | ORAL | Status: DC
  Administered 2021-02-14 – 2021-02-20 (×13): 12.5 mg via ORAL
  Filled 2021-02-14 (×13): qty 1

## 2021-02-14 NOTE — Assessment & Plan Note (Addendum)
Initially febrile and hypotensive. Currently sepsis resolved improved.  Continue current antibiotics.

## 2021-02-14 NOTE — Consult Note (Addendum)
Cardiology Consultation:   Patient ID: Andrea Kirk MRN: [id number]; DOB: 1940-01-13  Admit date: 02/13/2021 Date of Consult: 02/14/2021  PCP:  Aura Dials, MD   [facility] Providers Cardiologist:  Larae Grooms, MD     Patient Profile:   Andrea Kirk is a 81 y.o. female with a hx of CAD s/p CABG '97, CVA, HFpEF, DM, HTN, HLD, IDA, CKD II, PVD s/p R CEA and depression who is being seen 02/14/2021 for the evaluation of CHF at the request of Dr. Tawanna Solo.  History of Present Illness:   Andrea Kirk is a 81 yo female with PMH noted above.  She has been followed by Dr. Irish Lack as an outpatient.  Underwent remote CABG in 1997.  Underwent right carotid endarterectomy in September 2018.  She has had multiple falls over the years resulting in injury.  In May 2021 she was walking into [employer] to fill medications when she fell face first.  Broke her left wrist, right knee and multiple ribs.  Subsequently fell again in January 2022 as a result of not using her cane.  She was seen by electrophysiology and April 2022 after presenting with fall and slurred speech.  She was seen by neurology and found to have stroke.  EP discussed options of 30-day cardiac monitor versus loop recorder for implantation.  She preferred to follow-up as an outpatient to further discuss.  Echo during that admission showed LVEF of 50 to 55%, moderate LVH, grade 2 diastolic dysfunction, mildly reduced RV, severely elevated pulmonary artery pressures, 73 mmHg, mild to moderate tricuspid regurgitation.  She was brought to the ED on 2/7 with altered mental status from [place] home.  It was noted she had been altered since that morning.  Family reported that they had last seen her at her baseline about a week prior.  In the ED her labs showed sodium 137, potassium 4.1, creatinine 2.07, albumin 2.8, lactic acid 1.9>> 1.5, procalcitonin 85, WBC 10.8, hemoglobin 11.3.  Negative.  UA positive for leukocytes, many  bacteria.  Urine culture positive for E. coli.  CT head negative.  Chest x-ray showed mild bibasilar atelectasis vs pneumonia, small right pleural effusion. She was admitted to Internal medicine and treated with antibiotics and IVFs.   Last evening she became increasingly short of breath and hypertensive.  Increased O2 demand with 5 L nasal cannula.  She was noted to be volume overloaded and her IV fluids stopped.  BNP 1362, high-sensitivity troponin cycled 394>> 637. She was started on IV Lasix.  Urine output of 1 L.  O2 demand decreased to 2 L.   Past Medical History:  Diagnosis Date   Acute kidney injury ([place]) 08/05/2016   Aftercare following surgery of the circulatory system, NEC 05/11/2013   AKI (acute kidney injury) ([place]) 08/04/2016   Anxiety    takes Celexa daily   ARF (acute renal failure) ([place]) 11/07/2016   Asymptomatic stenosis of right carotid artery 03/22/2016   Back pain    occasionally   Carotid artery disease ([place]) 04/16/2013   Carotid artery occlusion    Carotid stenosis 11/16/2013   Cataract    left and immature   Coronary artery disease    Coronary atherosclerosis of native coronary artery 03/11/2013   S/p CABG in 1997    Depression    Diabetes mellitus    takes Metformin and Glipizide daily   Diabetes mellitus ([place]) 05/02/2015   Dizziness    takes Meclizine daily as needed  Essential hypertension, benign 03/11/2013   GERD (gastroesophageal reflux disease)    takes Omeprazole daily as needed   Headache(784.0)    Hyperlipidemia    takes Atorvastatin daily   Hypertension    takes Carvedilol daily   Mixed hyperlipidemia 03/11/2013   Muscle spasm    takes Robaxin daily as needed   Nausea    takes Phenergan daily as needed   Occlusion and stenosis of carotid artery without mention of cerebral infarction 07/19/2011   Pneumonia    hx of-in high school   Restless leg    takes Requip daily as needed   Seasonal allergies    takes Claritin daily as needed and Afrin as needed    Shortness of breath    with exertion   Urinary urgency    UTI (urinary tract infection) 11/07/2016   Past Surgical History:  Procedure Laterality Date   COLONOSCOPY WITH PROPOFOL N/A 03/10/2012   Procedure: COLONOSCOPY WITH PROPOFOL;  Surgeon: Garlan Fair, MD;  Location: [facility];  Service: Endoscopy;  Laterality: N/A;   CORNEAL TRANSPLANT Right    CORONARY ARTERY BYPASS GRAFT  1997   x 6   CORONARY ARTERY BYPASS GRAFT  Jan. 1997   ENDARTERECTOMY Left 04/16/2013   Procedure: Left Carotid Artery Endatarectomy with Resection of Redundant Internal Carotid Artery;  Surgeon: Rosetta Posner, MD;  Location: [facility];  Service: Vascular;  Laterality: Left;   ENDARTERECTOMY Right 03/22/2016   Procedure: RIGHT ENDARTERECTOMY CAROTID;  Surgeon: Rosetta Posner, MD;  Location: [facility] OR;  Service: Vascular;  Laterality: Right;   ESOPHAGOGASTRODUODENOSCOPY N/A 03/10/2012   Procedure: ESOPHAGOGASTRODUODENOSCOPY (EGD);  Surgeon: Garlan Fair, MD;  Location: [facility];  Service: Endoscopy;  Laterality: N/A;   EYE SURGERY  March 12, 2001   CORNEA TRANSPLANT Right eye   PATCH ANGIOPLASTY Right 03/22/2016   Procedure: PATCH ANGIOPLASTY;  Surgeon: Rosetta Posner, MD;  Location: [facility];  Service: Vascular;  Laterality: Right;   [place] SURGERY  march 2013   Back surgery   TONSILLECTOMY     TRIGGER FINGER RELEASE Left    thumb     Home Medications:  Prior to Admission medications   Medication Sig Start Date End Date Taking? Authorizing Provider  acetaminophen (TYLENOL) 325 MG tablet Take 650 mg by mouth every 6 (six) hours as needed for moderate pain.   Yes [provider]  Ascorbic Acid (VITAMIN C) 1000 MG tablet Take 1,000 mg by mouth daily.   Yes [provider]  aspirin EC 81 MG tablet Take 81 mg by mouth every evening.    Yes [provider]  busPIRone (BUSPAR) 7.5 MG tablet Take 7.5 mg by mouth 2 (two) times daily. 11/05/20  Yes  [provider]  carvedilol (COREG) 12.5 MG tablet Take 1 tablet (12.5 mg total) by mouth 2 (two) times daily. 06/28/20 04/14/21 Yes Dessa Phi, DO  citalopram (CELEXA) 20 MG tablet Take 20 mg by mouth daily.   Yes [provider]  Cyanocobalamin (B-12) 1000 MCG TABS Take 1,000 mcg by mouth daily.   Yes [provider]  docusate sodium (COLACE) 100 MG capsule Take 100 mg by mouth 2 (two) times daily.   Yes [provider]  ferrous sulfate 325 (65 FE) MG tablet Take 325 mg by mouth daily.   Yes [provider]  furosemide (LASIX) 20 MG tablet Take 20 mg by mouth daily as needed for edema. 10/17/20  Yes [provider]  gabapentin (NEURONTIN) 300 MG capsule Take 300 mg by mouth 4 (four) times daily.  02/28/16  Yes [provider]  glipiZIDE (GLUCOTROL XL) 5 MG 24 hr tablet Take 1 tablet (5 mg total) by mouth daily with breakfast. 04/21/20  Yes Allie Bossier, MD  HYDROcodone-acetaminophen [facility]) 10-325 MG tablet Take 1 tablet by mouth in the morning and at bedtime. Also takes 5/325mg  as needed for breakthrough pain   Yes [provider]  HYDROcodone-acetaminophen (NORCO/VICODIN) 5-325 MG tablet Take 1 tablet by mouth daily as needed (breakthrough pain). Also takes 10/325mg  twice daily scheduled   Yes [provider]  lidocaine (LIDODERM) 5 % Place 1 patch onto the skin daily. Apply to back and left leg for 12 hours per day for pain. Remove every morning. 11/08/20  Yes [provider]  Magnesium Oxide 250 MG TABS Take 250 mg by mouth daily.   Yes [provider]  meclizine (ANTIVERT) 25 MG tablet Take 25 mg by mouth 2 (two) times daily as needed for dizziness. For dizziness   Yes [provider]  melatonin 3 MG TABS tablet Take 3 mg by mouth at bedtime.   Yes [provider]  Menthol, Topical Analgesic, (BIOFREEZE) 4 % GEL Apply 1 application topically 3 (three) times daily as needed  (bilateral leg pain).   Yes [provider]  methocarbamol (ROBAXIN) 500 MG tablet Take 500 mg by mouth 3 (three) times daily. 05/17/20  Yes [provider]  nitroGLYCERIN (NITROSTAT) 0.4 MG SL tablet Place 1 tablet (0.4 mg total) under the tongue every 5 (five) minutes as needed. Chest pain. Patient taking differently: Place 0.4 mg under the tongue every 5 (five) minutes as needed for chest pain. 01/18/19  Yes Jettie Booze, MD  nystatin (MYCOSTATIN/NYSTOP) powder Apply 1 application topically 2 (two) times daily as needed (rash).   Yes [provider]  oxybutynin (DITROPAN-XL) 5 MG 24 hr tablet Take 5 mg by mouth daily. 09/26/20  Yes [provider]  Polyethyl Glycol-Propyl Glycol (SYSTANE) 0.4-0.3 % SOLN Apply 1-2 drops to eye 4 (four) times daily as needed (dry eye).   Yes [provider]  polyethylene glycol powder (GLYCOLAX/MIRALAX) 17 GM/SCOOP powder Take 17 g by mouth daily as needed for mild constipation.   Yes [provider]  saccharomyces boulardii (FLORASTOR) 250 MG capsule Take 250 mg by mouth daily.   Yes [provider]  ticagrelor (BRILINTA) 90 MG TABS tablet Take 1 tablet (90 mg total) by mouth 2 (two) times daily. 04/20/20  Yes Allie Bossier, MD    Inpatient Medications: Scheduled Meds:  carvedilol  12.5 mg Oral BID   enoxaparin (LOVENOX) injection  30 mg Subcutaneous Q24H   Continuous Infusions:  cefTRIAXone (ROCEPHIN)  IV     PRN Meds: acetaminophen **OR** acetaminophen, hydrALAZINE, labetalol  Allergies:    Allergies  Allergen Reactions   Codeine Other (See Comments)    Abnormal behavior    Latex Rash and Other (See Comments)    tears skin    Morphine And Related Other (See Comments)    Affects BP and blood sugar.   Cyclobenzaprine     MADE PT SICK- pt unsure if med was cyclobenzaprine or methocarbamol    Methocarbamol Other (See Comments)    MADE PT SICK- pt unsure if med was  cyclobenzaprine or methocarbamol  11/09/20 pt currently takes methocarbamol    Social History:   Social History   Socioeconomic History  Marital status: Widowed    Spouse name: Not on file   Number of children: Not on file   Years of education: Not on file   Highest education level: Not on file  Occupational History   Not on file  Tobacco Use   Smoking status: Never   Smokeless tobacco: Never  Vaping Use   Vaping Use: Never used  Substance and Sexual Activity   Alcohol use: No    Alcohol/week: 0.0 standard drinks   Drug use: No   Sexual activity: Never    Birth control/protection: Post-menopausal  Other Topics Concern   Not on file  Social History Narrative   Not on file   Social Determinants of Health   Financial Resource Strain: Not on file  Food Insecurity: Not on file  Transportation Needs: Not on file  Physical Activity: Not on file  Stress: Not on file  Social Connections: Not on file  Intimate Partner Violence: Not on file    Family History:    Family History  Problem Relation Age of Onset   Heart attack Father    Heart disease Father 55       Before age of 45   Hyperlipidemia Father    Heart disease Brother        Heart dissease before age 41   Hyperlipidemia Brother    Cirrhosis Mother    Heart attack Paternal Grandfather    Heart disease Paternal Grandfather    Cancer Maternal Grandmother    Alcoholism Maternal Grandfather    Heart attack Paternal Grandmother      ROS:  Please see the history of present illness.   All other ROS reviewed and negative.     Physical Exam/Data:   Vitals:   02/14/21 0600 02/14/21 0800 02/14/21 0927 02/14/21 1120  BP: (!) 151/80 (!) 158/68 (!) 183/76 (!) 169/75  Pulse: 91 93 89 94  Resp: (!) 23 (!) 22 20 16   Temp:  98.7 F (37.1 C) 99 F (37.2 C) 98.1 F (36.7 C)  TempSrc:  Oral Oral Oral  SpO2: 99% 92% 95% 97%  Weight:      Height:        Intake/Output Summary (Last 24 hours) at 02/14/2021  1252 Last data filed at 02/14/2021 0104 Gross per 24 hour  Intake --  Output 1050 ml  Net -1050 ml   Last 3 Weights 02/13/2021 12/26/2020 11/09/2020  Weight (lbs) 165 lb 165 lb 166 lb  Weight (kg) 74.844 kg 74.844 kg 75.297 kg     Body mass index is 27.46 kg/m.  General:  Well nourished, well developed, in no acute distress HEENT: normal Neck: no JVD Vascular: No carotid bruits; Distal pulses 2+ bilaterally Cardiac:  normal S1, S2; RRR; no murmur  Lungs:  clear to auscultation bilaterally, no wheezing, rhonchi or rales  Abd: soft, nontender, no hepatomegaly  Ext: no edema Musculoskeletal:  No deformities, BUE and BLE strength normal and equal Skin: warm and dry  Neuro:  CNs 2-12 intact, no focal abnormalities noted Psych:  Normal affect   EKG:  The EKG was personally reviewed and demonstrates:  SR 72 bpm nonspecific changes, QT 527 Telemetry:  Telemetry was personally reviewed and demonstrates:  SR  Relevant CV Studies:  Echo: 04/2020  IMPRESSIONS     1. Left ventricular ejection fraction, by estimation, is 50 to 55%. Left  ventricular ejection fraction by 3D volume is 51 %. The left ventricle has  low normal function. The left ventricle has  no regional wall motion  abnormalities. There is moderate left   ventricular hypertrophy. Left ventricular diastolic parameters are  consistent with Grade II diastolic dysfunction (pseudonormalization).  Elevated left ventricular end-diastolic pressure.   2. Right ventricular systolic function is mildly reduced. The right  ventricular size is mildly enlarged. There is severely elevated pulmonary  artery systolic pressure. The estimated right ventricular systolic  pressure is 97.9 mmHg.   3. The mitral valve is grossly normal. Moderate mitral valve  regurgitation.   4. Tricuspid valve regurgitation is mild to moderate.   5. The aortic valve is grossly normal. Aortic valve regurgitation is not  visualized. No aortic stenosis is present.    6. Aortic dilatation noted. There is moderate dilatation of the ascending  aorta, measuring 42 mm.   7. The inferior vena cava is dilated in size with <50% respiratory  variability, suggesting right atrial pressure of 15 mmHg.   FINDINGS   Left Ventricle: Left ventricular ejection fraction, by estimation, is 50  to 55%. Left ventricular ejection fraction by 3D volume is 51 %. The left  ventricle has low normal function. The left ventricle has no regional wall  motion abnormalities. The left  ventricular internal cavity size was normal in size. There is moderate  left ventricular hypertrophy. Left ventricular diastolic parameters are  consistent with Grade II diastolic dysfunction (pseudonormalization).  Elevated left ventricular end-diastolic  pressure.   Right Ventricle: The right ventricular size is mildly enlarged. Right  vetricular wall thickness was not well visualized. Right ventricular  systolic function is mildly reduced. There is severely elevated pulmonary  artery systolic pressure. The tricuspid  regurgitant velocity is 3.81 m/s, and with an assumed right atrial  pressure of 15 mmHg, the estimated right ventricular systolic pressure is  89.2 mmHg.   Left Atrium: Left atrial size was normal in size.   Right Atrium: Right atrial size was normal in size.   Pericardium: There is no evidence of pericardial effusion.   Mitral Valve: The mitral valve is grossly normal. Moderate mitral valve  regurgitation. MV peak gradient, 10.0 mmHg. The mean mitral valve gradient  is 3.0 mmHg.   Tricuspid Valve: The tricuspid valve is grossly normal. Tricuspid valve  regurgitation is mild to moderate.   Aortic Valve: The aortic valve is grossly normal. Aortic valve  regurgitation is not visualized. No aortic stenosis is present.   Pulmonic Valve: The pulmonic valve was grossly normal. Pulmonic valve  regurgitation is not visualized.   Aorta: Aortic dilatation noted. There is  moderate dilatation of the  ascending aorta, measuring 42 mm.   Venous: The inferior vena cava is dilated in size with less than 50%  respiratory variability, suggesting right atrial pressure of 15 mmHg.   IAS/Shunts: The atrial septum is grossly normal.   Laboratory Data:  High Sensitivity Troponin:   Recent Labs  Lab 02/13/21 2208 02/14/21 0116  TROPONINIHS 394* 637*     Chemistry Recent Labs  Lab 02/13/21 1103 02/13/21 1510 02/13/21 1536 02/14/21 0116  NA 137  --  138 135  K 4.1  --  4.4 4.1  CL 105  --   --  102  CO2 23  --   --  20*  GLUCOSE 140*  --   --  181*  BUN 50*  --   --  52*  CREATININE 2.07*  --   --  1.67*  CALCIUM 8.6*  --   --  9.2  MG  --  2.0  --   --  GFRNONAA 24*  --   --  31*  ANIONGAP 9  --   --  13    Recent Labs  Lab 02/13/21 1103  PROT 5.9*  ALBUMIN 2.8*  AST 19  ALT 14  ALKPHOS 85  BILITOT 1.0   Lipids No results for input(s): CHOL, TRIG, HDL, LABVLDL, LDLCALC, CHOLHDL in the last 168 hours.  Hematology Recent Labs  Lab 02/13/21 1103 02/13/21 1536 02/14/21 0116  WBC 10.8*  --  11.0*  RBC 3.33*  --  3.86*  HGB 11.3* 11.9* 13.5  HCT 35.9* 35.0* 41.2  MCV 107.8*  --  106.7*  MCH 33.9  --  35.0*  MCHC 31.5  --  32.8  RDW 14.4  --  14.6  PLT 91*  --  86*   Thyroid  Recent Labs  Lab 02/13/21 1507  TSH 0.579    BNP Recent Labs  Lab 02/13/21 2208  BNP 1,362.9*    DDimer No results for input(s): DDIMER in the last 168 hours.   Radiology/Studies:  CT HEAD WO CONTRAST (5MM)  Result Date: 02/13/2021 CLINICAL DATA:  Altered mental status. EXAM: CT HEAD WITHOUT CONTRAST TECHNIQUE: Contiguous axial images were obtained from the base of the skull through the vertex without intravenous contrast. RADIATION DOSE REDUCTION: This exam was performed according to the departmental dose-optimization program which includes automated exposure control, adjustment of the mA and/or kV according to patient size and/or use of iterative  reconstruction technique. COMPARISON:  CT head dated November 09, 2020. FINDINGS: Brain: No evidence of acute infarction, hemorrhage, hydrocephalus, extra-axial collection or mass lesion/mass effect. Stable mild chronic microvascular ischemic changes. Vascular: Calcified atherosclerosis at the skull base. No hyperdense vessel. Skull: Negative for fracture or focal lesion. Sinuses/Orbits: No acute finding. Other: None. IMPRESSION: 1. No acute intracranial abnormality. Electronically Signed   By: Titus Dubin M.D.   On: 02/13/2021 15:31   DG CHEST PORT 1 VIEW  Result Date: 02/13/2021 CLINICAL DATA:  Altered mental status EXAM: PORTABLE CHEST 1 VIEW COMPARISON:  02/13/2021 FINDINGS: Prior CABG. Cardiomegaly with vascular congestion. Interstitial prominence may reflect interstitial edema. No effusions or acute bony abnormality. IMPRESSION: Cardiomegaly with vascular congestion and probable early interstitial edema. Electronically Signed   By: Rolm Baptise M.D.   On: 02/13/2021 22:02   DG Chest Port 1 View  Result Date: 02/13/2021 CLINICAL DATA:  Question sepsis EXAM: PORTABLE CHEST 1 VIEW COMPARISON:  06/23/2020 FINDINGS: Postop CABG. Heart size upper normal. Mild vascular congestion without edema. Mild patchy bibasilar airspace disease may be atelectasis or pneumonia. Small right pleural effusion. IMPRESSION: Mild bibasilar atelectasis or pneumonia. Vascular congestion without edema.  Small right effusion. Electronically Signed   By: Franchot Gallo M.D.   On: 02/13/2021 11:28     Assessment and Plan:   Pierrette Scheu Mergenthaler is a 81 y.o. female with a hx of CAD s/p CABG '97, CVA, HFpEF, DM, HTN, HLD, IDA, CKD II, PVD s/p R CEA and depression who is being seen 02/14/2021 for the evaluation of CHF at the request of Dr. Tawanna Solo.  HFpEF: Echo 04/2020 LVEF 50-55%, G2DD. Presented with sepsis and volume resuscitated. Developed volume overload last evening with hypoxia. BNP 1362. Responded well to IV lasix, now - 1L.  Weaned to 2L [place] which is her baseline  -- can hold on additional diuresis for now  -- echo pending  AMS 2/2 sepsis in setting of UTI: UA positive, urine culture + E. Coli. Mentation improving -- antibiotics per primary  Elevated  Troponin: no chest pain, hsTn 394>>>637. EKG without acute ischemic changes. Suspect demand ischemia -- echo pending  AKI: baseline Cr 0.5-0.9, elevated at 2.07>>1.67 today  CAD s/p remote CABG '97: last stress test was 2012 which as normal -- continue medical therapy -- on ASA, BB, Brilinta (has been on this for hx of CVA)  CVA: head CT negative -- on ASA and Brilinta  HTN: blood pressures were initially lower on admission, now elevated.  -- continue coreg 12.5mg  BID  HLD: does not appear she has been on a statin PTA -- check lipids  Risk Assessment/Risk Scores:     New York Heart Association (NYHA) Functional Class NYHA Class II     For questions or updates, please contact [facility] Please consult [email or web address] for contact info under    Signed, Reino Bellis, NP  02/14/2021 12:52 PM  I have examined the patient and reviewed assessment and plan and discussed with patient.  Agree with above as stated.    Patient appears euvolemic at this time.  She is able to lie flat without shortness of breath.  Creatinine improving.  Elevated troponin likely due to demand ischemia. Medical management for CAD given extensive history.  Keep I's and O's even.  She does have foul-smelling, dark stool.  We will check Hemoccult given that she is on Brilinta..  Nurse reports that she is having frequent bowel movements as well.  Consider C. difficile.  Will defer to primary.  She has been on multiple antibiotics.  Larae Grooms

## 2021-02-14 NOTE — ED Notes (Signed)
Discussed pt POC with Dr Hal Hope. EKG obtained following +Troponin. Pt has not had any urinary out put since IV lasix. Bladder scan shows 450 mL. MD requests pt be In and out cath.

## 2021-02-14 NOTE — Evaluation (Signed)
Clinical/Bedside Swallow Evaluation Patient Details  Name: Andrea Kirk MRN: [id number] Date of Birth: 1940-10-09  Today's Date: 02/14/2021 Time: SLP Start Time (ACUTE ONLY): 1245 SLP Stop Time (ACUTE ONLY): 36 SLP Time Calculation (min) (ACUTE ONLY): 13 min  Past Medical History:  Past Medical History:  Diagnosis Date   Acute kidney injury ([place]) 08/05/2016   Aftercare following surgery of the circulatory system, NEC 05/11/2013   AKI (acute kidney injury) ([place]) 08/04/2016   Anxiety    takes Celexa daily   ARF (acute renal failure) ([place]) 11/07/2016   Asymptomatic stenosis of right carotid artery 03/22/2016   Back pain    occasionally   Carotid artery disease ([place]) 04/16/2013   Carotid artery occlusion    Carotid stenosis 11/16/2013   Cataract    left and immature   Coronary artery disease    Coronary atherosclerosis of native coronary artery 03/11/2013   S/p CABG in 1997    Depression    Diabetes mellitus    takes Metformin and Glipizide daily   Diabetes mellitus ([place]) 05/02/2015   Dizziness    takes Meclizine daily as needed   Essential hypertension, benign 03/11/2013   GERD (gastroesophageal reflux disease)    takes Omeprazole daily as needed   Headache(784.0)    Hyperlipidemia    takes Atorvastatin daily   Hypertension    takes Carvedilol daily   Mixed hyperlipidemia 03/11/2013   Muscle spasm    takes Robaxin daily as needed   Nausea    takes Phenergan daily as needed   Occlusion and stenosis of carotid artery without mention of cerebral infarction 07/19/2011   Pneumonia    hx of-in high school   Restless leg    takes Requip daily as needed   Seasonal allergies    takes Claritin daily as needed and Afrin as needed   Shortness of breath    with exertion   Urinary urgency    UTI (urinary tract infection) 11/07/2016   Past Surgical History:  Past Surgical History:  Procedure Laterality Date   COLONOSCOPY WITH PROPOFOL N/A 03/10/2012   Procedure: COLONOSCOPY WITH PROPOFOL;   Surgeon: Garlan Fair, MD;  Location: [facility];  Service: Endoscopy;  Laterality: N/A;   CORNEAL TRANSPLANT Right    CORONARY ARTERY BYPASS GRAFT  1997   x 6   CORONARY ARTERY BYPASS GRAFT  Jan. 1997   ENDARTERECTOMY Left 04/16/2013   Procedure: Left Carotid Artery Endatarectomy with Resection of Redundant Internal Carotid Artery;  Surgeon: Rosetta Posner, MD;  Location: [place];  Service: Vascular;  Laterality: Left;   ENDARTERECTOMY Right 03/22/2016   Procedure: RIGHT ENDARTERECTOMY CAROTID;  Surgeon: Rosetta Posner, MD;  Location: [facility] OR;  Service: Vascular;  Laterality: Right;   ESOPHAGOGASTRODUODENOSCOPY N/A 03/10/2012   Procedure: ESOPHAGOGASTRODUODENOSCOPY (EGD);  Surgeon: Garlan Fair, MD;  Location: [facility];  Service: Endoscopy;  Laterality: N/A;   EYE SURGERY  March 12, 2001   CORNEA TRANSPLANT Right eye   PATCH ANGIOPLASTY Right 03/22/2016   Procedure: PATCH ANGIOPLASTY;  Surgeon: Rosetta Posner, MD;  Location: [facility] OR;  Service: Vascular;  Laterality: Right;   [place] SURGERY  march 2013   Back surgery   TONSILLECTOMY     TRIGGER FINGER RELEASE Left    thumb   HPI:  Patient is a 81 year old female with history of CVA, coronary artery disease status post CABG, diastolic CHF, diabetes type 2, hypertension, hyperlipidemia, iron deficiency anemia,  depression, CKD stage II who presented from skilled nursing facility due to altered mental status.  On presentation she was febrile, hypertensive, hypoxic on room air.  Lab work showed creatinine of 2, UA showed elevated leukocytes.  Chest x-ray showed mild bibasilar atelectasis or pneumonia, vascular congestion, small right pleural effusion.    Assessment / Plan / Recommendation  Clinical Impression  Pt was seen at bedside for swallow evaluation. Pt appeared lethargic and required verbal and tactile cues in order to participate.Oral mech exam was [facility] with adequate labial/lingual ROM, symmetry and  stregnth. Pt was presented with thin liquids, puree, and solid graham cracker. SLP used straw to siphon liquids into oral cavity initially 2x to generate anticipatory awareness for receiving straw sip. Pt tolerated individual sips of thin liquid with no overt s/sx of aspiration. Puree textures had a favorable impact on the patient; lateralization and BOT reatraction adequate for oral clearance of novel bolus. SLP introduced solids and pt did not perform bite-and-pull or chewing skills. SLP recommends placing pt on Dys 1 and thin liquids at this time. SLP to follow during acute stay to monitor need for diet toleration and advancement. SLP Visit Diagnosis: Dysphagia, unspecified (R13.10)    Aspiration Risk  Mild aspiration risk    Diet Recommendation Dysphagia 1 (Puree);Thin liquid   Liquid Administration via: Straw Medication Administration: Crushed with puree Supervision: Staff to assist with self feeding Compensations: Minimize environmental distractions;Small sips/bites Postural Changes: Seated upright at 90 degrees    Other  Recommendations Oral Care Recommendations: Oral care BID    Recommendations for follow up therapy are one component of a multi-disciplinary discharge planning process, led by the attending physician.  Recommendations may be updated based on patient status, additional functional criteria and insurance authorization.  Follow up Recommendations Skilled nursing-short term rehab (<3 hours/day)      Assistance Recommended at Discharge Frequent or constant Supervision/Assistance  Functional Status Assessment Patient has had a recent decline in their functional status and demonstrates the ability to make significant improvements in function in a reasonable and predictable amount of time.  Frequency and Duration min 2x/week  2 weeks       Prognosis Prognosis for Safe Diet Advancement: Good      Swallow Study   General HPI: Patient is a 81 year old female with history  of CVA, coronary artery disease status post CABG, diastolic CHF, diabetes type 2, hypertension, hyperlipidemia, iron deficiency anemia, depression, CKD stage II who presented from skilled nursing facility due to altered mental status.  On presentation she was febrile, hypertensive, hypoxic on room air.  Lab work showed creatinine of 2, UA showed elevated leukocytes.  Chest x-ray showed mild bibasilar atelectasis or pneumonia, vascular congestion, small right pleural effusion. Type of Study: Bedside Swallow Evaluation Diet Prior to this Study: Regular;Thin liquids Temperature Spikes Noted: No History of Recent Intubation: No Behavior/Cognition: Lethargic/Drowsy;Requires cueing Oral Cavity Assessment: Within Functional Limits Oral Care Completed by SLP: No Oral Cavity - Dentition: Adequate natural dentition Self-Feeding Abilities: Total assist Patient Positioning: Upright in bed Baseline Vocal Quality: Normal    Oral/Motor/Sensory Function Overall Oral Motor/Sensory Function: Within functional limits   Ice Chips Ice chips: Not tested   Thin Liquid Thin Liquid: Within functional limits Presentation: Straw    Nectar Thick Nectar Thick Liquid: Not tested   Honey Thick Honey Thick Liquid: Not tested   Puree Puree: Within functional limits   Solid     Solid: Impaired Oral Phase Impairments: Poor awareness of bolus;Impaired mastication Oral  Phase Functional Implications: Impaired mastication;Oral holding      [employer] 02/14/2021,1:10 PM

## 2021-02-14 NOTE — Progress Notes (Signed)
PHARMACY - PHYSICIAN COMMUNICATION CRITICAL VALUE ALERT - BLOOD CULTURE IDENTIFICATION (BCID)  Andrea Kirk is an 81 y.o. female who presented to [facility] on 02/13/2021 with a chief complaint of AMS.  Assessment:  Admitted for hypotension and hypoxia, started on broad-spectrum ABX, CXR overnight showed fluid congestion, now growing E.coli in 3 of 4 blood cx bottles.  Name of physician (or Provider) ContactedWallie Char MD  Current antibiotics: vancomycin and cefepime  Changes to prescribed antibiotics recommended:  Narrow to ceftriaxone.  Results for orders placed or performed during the hospital encounter of 02/13/21  Blood Culture ID Panel (Reflexed) (Collected: 02/13/2021 11:00 AM)  Result Value Ref Range   Enterococcus faecalis NOT DETECTED NOT DETECTED   Enterococcus Faecium NOT DETECTED NOT DETECTED   Listeria monocytogenes NOT DETECTED NOT DETECTED   Staphylococcus species NOT DETECTED NOT DETECTED   Staphylococcus aureus (BCID) NOT DETECTED NOT DETECTED   Staphylococcus epidermidis NOT DETECTED NOT DETECTED   Staphylococcus lugdunensis NOT DETECTED NOT DETECTED   Streptococcus species NOT DETECTED NOT DETECTED   Streptococcus agalactiae NOT DETECTED NOT DETECTED   Streptococcus pneumoniae NOT DETECTED NOT DETECTED   Streptococcus pyogenes NOT DETECTED NOT DETECTED   A.calcoaceticus-baumannii NOT DETECTED NOT DETECTED   Bacteroides fragilis NOT DETECTED NOT DETECTED   Enterobacterales DETECTED (A) NOT DETECTED   Enterobacter cloacae complex NOT DETECTED NOT DETECTED   Escherichia coli DETECTED (A) NOT DETECTED   Klebsiella aerogenes NOT DETECTED NOT DETECTED   Klebsiella oxytoca NOT DETECTED NOT DETECTED   Klebsiella pneumoniae NOT DETECTED NOT DETECTED   Proteus species NOT DETECTED NOT DETECTED   Salmonella species NOT DETECTED NOT DETECTED   Serratia marcescens NOT DETECTED NOT DETECTED   Haemophilus influenzae NOT DETECTED NOT DETECTED   Neisseria meningitidis NOT  DETECTED NOT DETECTED   Pseudomonas aeruginosa NOT DETECTED NOT DETECTED   Stenotrophomonas maltophilia NOT DETECTED NOT DETECTED   Candida albicans NOT DETECTED NOT DETECTED   Candida auris NOT DETECTED NOT DETECTED   Candida glabrata NOT DETECTED NOT DETECTED   Candida krusei NOT DETECTED NOT DETECTED   Candida parapsilosis NOT DETECTED NOT DETECTED   Candida tropicalis NOT DETECTED NOT DETECTED   Cryptococcus neoformans/gattii NOT DETECTED NOT DETECTED   CTX-M ESBL NOT DETECTED NOT DETECTED   Carbapenem resistance IMP NOT DETECTED NOT DETECTED   Carbapenem resistance KPC NOT DETECTED NOT DETECTED   Carbapenem resistance NDM NOT DETECTED NOT DETECTED   Carbapenem resist OXA 48 LIKE NOT DETECTED NOT DETECTED   Carbapenem resistance VIM NOT DETECTED NOT DETECTED    Wynona Neat, PharmD, BCPS 02/14/2021 4:22 AM

## 2021-02-14 NOTE — Progress Notes (Signed)
°  Transition of Care (TOC) Screening Note   Patient Details  Name: Andrea Kirk Date of Birth: August 28, 1940   Transition of [facility]) CM/SW Contact:    Pollie Friar, RN Phone Number: 02/14/2021, 1:44 PM   Patient was admitted from [facility] SNF. Transition of Care Department [facility]) has reviewed patient. We will continue to monitor patient advancement through interdisciplinary progression rounds. If new patient transition needs arise, please place a TOC consult.

## 2021-02-14 NOTE — Evaluation (Signed)
Physical Therapy Evaluation Patient Details Name: Andrea Kirk MRN: [id number] DOB: 1940-12-15 Today's Date: 02/14/2021  History of Present Illness  Andrea Kirk is a 81 y.o. female admitted 02/13/21 from SNF ([facility]) due to altered mental status. CT negative for acute abnormality. Pt has been having watery frequent stools since arrival to ED. PMH includes CVA, carotid artery disease with CABG, diastolic CHF, R4WN, HTN, HLD, iron deficiency anemia, depression CKD stage 2  Clinical Impression  Pt admitted secondary to problem above with deficits below. Pt with increased confusion throughout and yelling out during clean up. Requiring max A +2 for bed mobility tasks. Pt currently from SNF, but reports she was ambulating with use of RW. Recommend return to SNF at d/c and would likely benefit from PT follow up if pt not close to baseline. Will continue to follow acutely.        Recommendations for follow up therapy are one component of a multi-disciplinary discharge planning process, led by the attending physician.  Recommendations may be updated based on patient status, additional functional criteria and insurance authorization.  Follow Up Recommendations Skilled nursing-short term rehab (<3 hours/day)    Assistance Recommended at Discharge Frequent or constant Supervision/Assistance  Patient can return home with the following       Equipment Recommendations None recommended by PT  Recommendations for Other Services       Functional Status Assessment Patient has had a recent decline in their functional status and demonstrates the ability to make significant improvements in function in a reasonable and predictable amount of time.     Precautions / Restrictions Precautions Precautions: Fall Precaution Comments: watch for Cdiff results Restrictions Weight Bearing Restrictions: No      Mobility  Bed Mobility Overal bed mobility: Needs Assistance Bed Mobility: Rolling Rolling: Max  assist, +2 for safety/equipment         General bed mobility comments: Pt able to minimally assist with max cues. Pt had large BM and session focused on clean up. Yelling out throughout.    Transfers                   General transfer comment: unsafe to attempt at this time    Ambulation/Gait                  Stairs            Wheelchair Mobility    Modified Rankin (Stroke Patients Only)       Balance                                             Pertinent Vitals/Pain Pain Assessment Pain Assessment: Faces Faces Pain Scale: Hurts little more Pain Location: stomach/abdomen Pain Descriptors / Indicators: Discomfort Pain Intervention(s): Limited activity within patient's tolerance, Monitored during session, Repositioned    Home Living Family/patient expects to be discharged to:: Skilled nursing facility                   Additional Comments: Resident at [facility]    Prior Function Prior Level of Function : Needs assist;Patient poor historian/Family not available             Mobility Comments: per patient, she typically uses a walker to move around ADLs Comments: She gets PRN assist from staff     Hand Dominance   Dominant Hand:  Right    Extremity/Trunk Assessment   Upper Extremity Assessment Upper Extremity Assessment: Defer to OT evaluation    Lower Extremity Assessment Lower Extremity Assessment: Generalized weakness       Communication   Communication: Other (comment) (AMS, answering approx 50% of questions)  Cognition Arousal/Alertness: Awake/alert Behavior During Therapy: Restless Overall Cognitive Status: Impaired/Different from baseline Area of Impairment: Orientation, Following commands, Safety/judgement, Awareness, Problem solving                 Orientation Level: Disoriented to, Place, Time, Situation     Following Commands: Follows one step commands  inconsistently Safety/Judgement: Decreased awareness of safety, Decreased awareness of deficits Awareness: Emergent Problem Solving: Decreased initiation, Requires verbal cues, Requires tactile cues          General Comments General comments (skin integrity, edema, etc.): no family present during the eval    Exercises     Assessment/Plan    PT Assessment Patient needs continued PT services  PT Problem List Decreased strength;Decreased activity tolerance;Decreased balance;Decreased mobility;Decreased knowledge of use of DME;Decreased knowledge of precautions;Decreased cognition;Decreased safety awareness       PT Treatment Interventions DME instruction;Gait training;Functional mobility training;Therapeutic activities;Balance training;Therapeutic exercise;Patient/family education    PT Goals (Current goals can be found in the Care Plan section)  Acute Rehab PT Goals PT Goal Formulation: Patient unable to participate in goal setting Time For Goal Achievement: 02/28/21 Potential to Achieve Goals: Fair    Frequency Min 2X/week     Co-evaluation PT/OT/SLP Co-Evaluation/Treatment: Yes Reason for Co-Treatment: Necessary to address cognition/behavior during functional activity;For patient/therapist safety PT goals addressed during session: Mobility/safety with mobility;Balance OT goals addressed during session: ADL's and self-care;Strengthening/ROM       AM-PAC PT "6 Clicks" Mobility  Outcome Measure Help needed turning from your back to your side while in a flat bed without using bedrails?: Total Help needed moving from lying on your back to sitting on the side of a flat bed without using bedrails?: Total Help needed moving to and from a bed to a chair (including a wheelchair)?: Total Help needed standing up from a chair using your arms (e.g., wheelchair or bedside chair)?: Total Help needed to walk in hospital room?: Total Help needed climbing 3-5 steps with a railing? :  Total 6 Click Score: 6    End of Session   Activity Tolerance: Patient limited by lethargy;Patient limited by fatigue Patient left: in bed;with call bell/phone within reach;with bed alarm set Nurse Communication: Mobility status PT Visit Diagnosis: Unsteadiness on feet (R26.81);Muscle weakness (generalized) (M62.81);Difficulty in walking, not elsewhere classified (R26.2)    Time: 6378-5885 PT Time Calculation (min) (ACUTE ONLY): 23 min   Charges:   PT Evaluation $PT Eval Moderate Complexity: 1 Mod          Reuel Derby, PT, DPT  Acute Rehabilitation Services  Pager: [phone] Office: [phone]   Rudean Hitt 02/14/2021, 2:25 PM

## 2021-02-14 NOTE — Progress Notes (Signed)
Pt placed on Bipap with Large face mask. Mask was leaking & impacting ventilation. Mask changed to Medium and ventilation improved. Family at the bedside. CCM consulted and are at bedside.

## 2021-02-14 NOTE — Progress Notes (Signed)
Progress Note   Patient: Andrea Kirk TML:[id number] DOB: 1940/09/04 DOA: 02/13/2021     1 DOS: the patient was seen and examined on 02/14/2021   Brief hospital course: Patient is a 81 year old female with history of CVA, coronary artery disease status post CABG, diastolic CHF, diabetes type 2, hypertension, hyperlipidemia, iron deficiency anemia, depression, CKD stage II who presented from skilled nursing facility due to altered mental status.  On presentation she was febrile, hypertensive, hypoxic on room air.  Lab work showed creatinine of 2, UA showed elevated leukocytes.  Chest x-ray showed mild bibasilar atelectasis or pneumonia, vascular congestion, small right pleural effusion.  Code sepsis was initiated in the emergency department and was given IV fluids.  Lab work also showed elevated troponin, cardiology consulted.  Assessment and Plan: * Acute encephalopathy 81 year old female presenting with AMS and sepsis -appears likely secondary to UTI, but also questionable pneumonia on CXR.  Elevated procalcitonin.  Blood cultures will be followed.  Started on broad-spectrum antibiotics. -aspiration precautions  -Head CT did not show any acute intracranial abnormalities. -will monitor metabolic labs, even though infectious etiology seems to be source -she is on a lot of medication so  polypharmacy also could also be contributing to her weakness/sleepiness: buspar/celexa/gabapentin/norco/robaxin-holding these for now with AMS -Mental status has improved today.  She is currently oriented, can tell current place, current month  Gram-negative bacteremia Urine culture and blood cultures showing gram-negative rods: E. coli.  Continue ceftriaxone 2 g daily.  We will follow-up final culture and sensitivity  Severe sepsis ([place]) Initially febrile and hypotensive. Currently sepsis resolved improved.  Continue current antibiotics.  Follow-up cultures  Elevated troponin Elevated troponin.  Jumped from 394  to 637.  Denies any chest pain.  Could be from supply demand ischemia from sepsis. cardiology consulted and following.  Will check echocardiogram  Acute renal failure superimposed on stage 2 chronic kidney disease ([place])- (present on admission) Likely prerenal in setting of sepsis Kidney function improved with IV fluids.  Continue to monitor  Essential hypertension- (present on admission) Initially hypertensive now hypertensive Restarted home Coreg Continue as needed medications for severe hypertension   Acute respiratory failure with hypoxia ([place])- (present on admission) As on her hospital discharge in 04/2020 after a stroke she was discharged on home oxygen 2L  Requiring 4L with EMS, 2L here which could be her baseline Treating pneumonia, not retaining CO2, clinically appears dry and not volume overloaded   Chronic diastolic CHF (congestive heart failure) ([place])- (present on admission) -She was initially started on IV fluids now stopped.  Elevated BNP.  Chest x-ray showed vascular congestion -Echo in 04/2020 showed EF of 50-55% with grade 2 diastolic dysfunction. Mildly reduced right VF. Severely elevated pulmonary artery pressure.  Follow-up new echocardiogram -intake/output -Given IV Lasix -holding coreg with hypotension  -Cardiology consulted  Thrombocytopenia ([place])- (present on admission) Low but stable Continue to monitor   Iron deficiency anemia- (present on admission) At her baseline continue to monitor   HLD (hyperlipidemia)- (present on admission) Not on statin, no allergy unsure why   Controlled type 2 diabetes mellitus with hyperglycemia ([place])- (present on admission) No recent labs Takes  Glipizide, currently on hold SSI and routine accuchecks   hx of CVA (cerebral vascular accident) ([place])- (present on admission) Head CT with no acute finding today  Continue ASA/brillinta   Carotid artery disease ([place])- (present on admission) Continue ASA/Brillinta   Anxiety  and depression On buspar/celexa Continue once alert and tolerating PO  Subjective:  Patient seen and examined at the bedside this morning.  Sleepy, drowsy during my evaluation.  But woke up on calling her name.  She knew that she is in the hospital and the current month is February.  She says everything is bothering her.  Does not look in any distress  Physical Exam: Vitals:   02/14/21 0600 02/14/21 0800 02/14/21 0927 02/14/21 1120  BP: (!) 151/80 (!) 158/68 (!) 183/76 (!) 169/75  Pulse: 91 93 89 94  Resp: (!) 23 (!) 22 20 16   Temp:  98.7 F (37.1 C) 99 F (37.2 C) 98.1 F (36.7 C)  TempSrc:  Oral Oral Oral  SpO2: 99% 92% 95% 97%  Weight:      Height:       General exam: Not in distress, very deconditioned HEENT: PERRL Respiratory system: Mild bibasilar crackles Cardiovascular system: S1 & S2 heard, RRR.  Gastrointestinal system: Abdomen is nondistended, soft and nontender. Central nervous system: Alert and mostly oriented Extremities: No edema, no clubbing ,no cyanosis Skin: No rashes, no ulcers,no icterus    Data Reviewed:    Family Communication: None at the bedside  Disposition: Status is: Inpatient    Planned Discharge Destination: Skilled nursing facility      Author: Shelly Coss, MD 02/14/2021 11:44 AM  For on call review [email or web address].

## 2021-02-14 NOTE — Progress Notes (Signed)
Left for CT scan via transported in bed. Sleeping. No s/s of distress. 2L O2 on patient.

## 2021-02-14 NOTE — Progress Notes (Signed)
Back from CT with transporter via bed. No s/s of distress. Denies pain. SCDs on. No BM visulalized. Purewick replaced. Water provided. Bed alarm on.

## 2021-02-14 NOTE — Progress Notes (Incomplete)
°  Echocardiogram 2D Echocardiogram attempted. Pt uncooperative will try again tomorrow per RN.  Fidel Levy 02/14/2021, 2:35 PM

## 2021-02-14 NOTE — Assessment & Plan Note (Addendum)
Urine culture and blood cultures showing gram-negative rods: E. coli.  She was on ceftriaxone 2 g daily.Cx showed  pansensitive E. Coli.  Antibiotics changed to cefadroxil for total of 7 days course

## 2021-02-14 NOTE — Evaluation (Signed)
Occupational Therapy Evaluation Patient Details Name: Andrea Kirk MRN: [id number] DOB: September 21, 1940 Today's Date: 02/14/2021   History of Present Illness Andrea Kirk is a 81 y.o. female admitted 02/13/21 from SNF ([facility]) due to altered mental status. CT negative for acute abnormality. Pt has been having watery frequent stools since arrival to ED. PMH includes CVA, carotid artery disease with CABG, diastolic CHF, U8EK, HTN, HLD, iron deficiency anemia, depression CKD stage 2   Clinical Impression   Pt unreliable historian, she reports that at baseline she ambulates with RW, and largely does her own ADL - gets assist from staff PRN. Today she is total to max A for all aspects of ADL - largely impacted by AMS. She was found in giant puddle of very wet stool. Pt able to roll with max A multiple times for bed level clean up. Pt following approx 50% of single commands, also disoriented to place and situation. Pt on 4L O2 throughout session via [place]. OT will continue to follow acutely and recommend follow up at SNF post-acute to ensure ret to PLOF. Next session focus on [facility] transfer and functional ADL.       Recommendations for follow up therapy are one component of a multi-disciplinary discharge planning process, led by the attending physician.  Recommendations may be updated based on patient status, additional functional criteria and insurance authorization.   Follow Up Recommendations  Skilled nursing-short term rehab (<3 hours/day)    Assistance Recommended at Discharge Frequent or constant Supervision/Assistance  Patient can return home with the following A lot of help with walking and/or transfers;A lot of help with bathing/dressing/bathroom    Functional Status Assessment  Patient has had a recent decline in their functional status and demonstrates the ability to make significant improvements in function in a reasonable and predictable amount of time.  Equipment Recommendations  None  recommended by OT    Recommendations for Other Services PT consult     Precautions / Restrictions Precautions Precautions: Fall Precaution Comments: watch for Cdiff results Restrictions Weight Bearing Restrictions: No      Mobility Bed Mobility Overal bed mobility: Needs Assistance Bed Mobility: Rolling Rolling: Max assist, +2 for safety/equipment         General bed mobility comments: Pt able to minimally assist with max cues    Transfers                   General transfer comment: unsafe to attempt at this time      Balance                                           ADL either performed or assessed with clinical judgement   ADL Overall ADL's : Needs assistance/impaired                                       General ADL Comments: currently total A to max A for all aspects of ADL     Vision Baseline Vision/History: 1 Wears glasses Ability to See in Adequate Light: 0 Adequate Patient Visual Report: No change from baseline Additional Comments: Pt largely with eyes closed throughout session.     Perception     Praxis      Pertinent Vitals/Pain Pain Assessment Pain Assessment: Faces Faces Pain  Scale: Hurts little more Pain Location: stomach/abdomen Pain Descriptors / Indicators: Discomfort Pain Intervention(s): Monitored during session, Repositioned     Hand Dominance Right   Extremity/Trunk Assessment Upper Extremity Assessment Upper Extremity Assessment: Generalized weakness   Lower Extremity Assessment Lower Extremity Assessment: Defer to PT evaluation       Communication Communication Communication: Other (comment) (AMS, answering approx 50% of questions)   Cognition Arousal/Alertness: Awake/alert Behavior During Therapy: Restless Overall Cognitive Status: Impaired/Different from baseline Area of Impairment: Orientation, Following commands, Safety/judgement, Awareness, Problem solving                  Orientation Level: Disoriented to, Place, Time, Situation     Following Commands: Follows one step commands inconsistently Safety/Judgement: Decreased awareness of safety, Decreased awareness of deficits Awareness: Emergent Problem Solving: Decreased initiation, Requires verbal cues, Requires tactile cues       General Comments  no family present during the eval    Exercises     Shoulder Instructions      Home Living Family/patient expects to be discharged to:: Skilled nursing facility                                 Additional Comments: Resident at [facility]      Prior Functioning/Environment Prior Level of Function : Needs assist;Patient poor historian/Family not available             Mobility Comments: per patient, she typically uses a walker to move around ADLs Comments: She gets PRN assist from staff        OT Problem List: Decreased activity tolerance;Impaired balance (sitting and/or standing);Decreased cognition;Decreased safety awareness      OT Treatment/Interventions: Self-care/ADL training;DME and/or AE instruction;Cognitive remediation/compensation;Therapeutic activities;Patient/family education;Balance training    OT Goals(Current goals can be found in the care plan section) Acute Rehab OT Goals Patient Stated Goal: none stated OT Goal Formulation: With patient Time For Goal Achievement: 02/28/21 Potential to Achieve Goals: Fair ADL Goals Pt Will Perform Grooming: with set-up;sitting Pt Will Perform Upper Body Dressing: with set-up;sitting Pt Will Perform Lower Body Dressing: with min guard assist;sit to/from stand Pt Will Transfer to Toilet: with min assist;stand pivot transfer Pt Will Perform Toileting - Clothing Manipulation and hygiene: with min assist;sit to/from stand  OT Frequency: Min 2X/week    Co-evaluation PT/OT/SLP Co-Evaluation/Treatment: Yes Reason for Co-Treatment: Necessary to address cognition/behavior  during functional activity;For patient/therapist safety PT goals addressed during session: Mobility/safety with mobility;Balance;Strengthening/ROM OT goals addressed during session: ADL's and self-care;Strengthening/ROM      AM-PAC OT "6 Clicks" Daily Activity     Outcome Measure Help from another person eating meals?: A Lot Help from another person taking care of personal grooming?: Total Help from another person toileting, which includes using toliet, bedpan, or urinal?: Total Help from another person bathing (including washing, rinsing, drying)?: Total Help from another person to put on and taking off regular upper body clothing?: Total Help from another person to put on and taking off regular lower body clothing?: Total 6 Click Score: 7   End of Session Equipment Utilized During Treatment: Oxygen (4L) Nurse Communication: Mobility status  Activity Tolerance: Treatment limited secondary to agitation (cognition) Patient left: in bed;with call bell/phone within reach;with bed alarm set  OT Visit Diagnosis: Unsteadiness on feet (R26.81);Other abnormalities of gait and mobility (R26.89);Muscle weakness (generalized) (M62.81);Other symptoms and signs involving cognitive function  Time: 7628-3151 OT Time Calculation (min): 23 min Charges:  OT General Charges $OT Visit: 1 Visit OT Evaluation $OT Eval Moderate Complexity: [place] OTR/L Acute Rehabilitation Services Pager: [phone] Office: [place] 02/14/2021, 2:00 PM

## 2021-02-14 NOTE — Assessment & Plan Note (Addendum)
Elevated troponin.  Jumped from 394 to 637.  No report of chest pain.  Could be from supply demand ischemia from sepsis. cardiology was consulted, no plan for any further work-up.  Echocardiogram as documented

## 2021-02-14 NOTE — ED Notes (Signed)
Discussed pt with admitting Dr Hal Hope. Informed pt status has not changed since obtaining report at 11 pm. Pt continues to require 4 L of O2 via nasal cannula. Mentation pt remains confused. Speech is clear. Pt is able to make requests and follows commands. MD informed pt has had 4 watery stools. Pending new orders.

## 2021-02-14 NOTE — Hospital Course (Addendum)
Patient is a 81 year old female with history of CVA, coronary artery disease status post CABG, diastolic CHF, diabetes type 2, hypertension, hyperlipidemia, iron deficiency anemia, depression, CKD stage II who presented from skilled nursing facility due to altered mental status.  On presentation she was febrile, hypertensive, hypoxic on room air.  Lab work showed creatinine of 2, UA showed elevated leukocytes.  Chest x-ray showed mild bibasilar atelectasis or pneumonia, vascular congestion, small right pleural effusion.  Code sepsis was initiated in the emergency department and was given IV fluids.  Lab work also showed elevated troponin, cardiology consulted.  Blood cultures showed E. coli with source likely the urine.  She was having frequent diarrhea, C. difficile came out to positive.  Started on vancomycin.  Overall status including mental status have significantly improved.  Plan is to discharge back to skilled nursing facility when appropriate,likely tomorrow

## 2021-02-15 ENCOUNTER — Inpatient Hospital Stay (HOSPITAL_COMMUNITY): Payer: Medicare Other

## 2021-02-15 DIAGNOSIS — I5032 Chronic diastolic (congestive) heart failure: Secondary | ICD-10-CM | POA: Diagnosis not present

## 2021-02-15 DIAGNOSIS — A0472 Enterocolitis due to Clostridium difficile, not specified as recurrent: Secondary | ICD-10-CM

## 2021-02-15 LAB — CBC WITH DIFFERENTIAL/PLATELET
Abs Immature Granulocytes: 0.01 10*3/uL (ref 0.00–0.07)
Basophils Absolute: 0 10*3/uL (ref 0.0–0.1)
Basophils Relative: 0 %
Eosinophils Absolute: 0 10*3/uL (ref 0.0–0.5)
Eosinophils Relative: 0 %
HCT: 34.1 % — ABNORMAL LOW (ref 36.0–46.0)
Hemoglobin: 11.3 g/dL — ABNORMAL LOW (ref 12.0–15.0)
Immature Granulocytes: 0 %
Lymphocytes Relative: 6 %
Lymphs Abs: 0.4 10*3/uL — ABNORMAL LOW (ref 0.7–4.0)
MCH: 34.3 pg — ABNORMAL HIGH (ref 26.0–34.0)
MCHC: 33.1 g/dL (ref 30.0–36.0)
MCV: 103.6 fL — ABNORMAL HIGH (ref 80.0–100.0)
Monocytes Absolute: 0.4 10*3/uL (ref 0.1–1.0)
Monocytes Relative: 6 %
Neutro Abs: 5.9 10*3/uL (ref 1.7–7.7)
Neutrophils Relative %: 88 %
Platelets: 64 10*3/uL — ABNORMAL LOW (ref 150–400)
RBC: 3.29 MIL/uL — ABNORMAL LOW (ref 3.87–5.11)
RDW: 14.6 % (ref 11.5–15.5)
WBC Morphology: INCREASED
WBC: 6.7 10*3/uL (ref 4.0–10.5)
nRBC: 0 % (ref 0.0–0.2)

## 2021-02-15 LAB — PROCALCITONIN: Procalcitonin: 57.77 ng/mL

## 2021-02-15 LAB — BASIC METABOLIC PANEL
Anion gap: 10 (ref 5–15)
BUN: 40 mg/dL — ABNORMAL HIGH (ref 8–23)
CO2: 26 mmol/L (ref 22–32)
Calcium: 8.9 mg/dL (ref 8.9–10.3)
Chloride: 103 mmol/L (ref 98–111)
Creatinine, Ser: 1.06 mg/dL — ABNORMAL HIGH (ref 0.44–1.00)
GFR, Estimated: 53 mL/min — ABNORMAL LOW (ref >60)
Glucose, Bld: 176 mg/dL — ABNORMAL HIGH (ref 70–99)
Potassium: 3.6 mmol/L (ref 3.5–5.1)
Sodium: 139 mmol/L (ref 135–145)

## 2021-02-15 LAB — URINE CULTURE: Culture: >=100000 — AB

## 2021-02-15 LAB — OCCULT BLOOD X 1 CARD TO LAB, STOOL
Fecal Occult Bld: POSITIVE — AB
Fecal Occult Bld: POSITIVE — AB

## 2021-02-15 LAB — ECHOCARDIOGRAM COMPLETE
AR max vel: 2.43 cm2
AV Area VTI: 2.22 cm2
AV Area mean vel: 2.23 cm2
AV Mean grad: 9 mmHg
AV Peak grad: 15.7 mmHg
Ao pk vel: 1.98 m/s
Area-P 1/2: 3.6 cm2
Height: 65 in
MV VTI: 2.46 cm2
S' Lateral: 3.1 cm
Weight: 2691.38 oz

## 2021-02-15 LAB — CLOSTRIDIUM DIFFICILE BY PCR, REFLEXED: Toxigenic C. Difficile by PCR: POSITIVE — AB

## 2021-02-15 LAB — C DIFFICILE QUICK SCREEN W PCR REFLEX
C Diff antigen: POSITIVE — AB
C Diff toxin: NEGATIVE

## 2021-02-15 MED ORDER — ORAL CARE MOUTH RINSE
15.0000 mL | Freq: Two times a day (BID) | OROMUCOSAL | Status: DC
  Administered 2021-02-15 – 2021-02-20 (×11): 15 mL via OROMUCOSAL

## 2021-02-15 MED ORDER — VANCOMYCIN HCL 125 MG PO CAPS
125.0000 mg | ORAL_CAPSULE | Freq: Four times a day (QID) | ORAL | Status: DC
  Administered 2021-02-15 – 2021-02-20 (×21): 125 mg via ORAL
  Filled 2021-02-15 (×24): qty 1

## 2021-02-15 MED ORDER — ONDANSETRON HCL 4 MG/2ML IJ SOLN
4.0000 mg | Freq: Four times a day (QID) | INTRAMUSCULAR | Status: DC | PRN
  Administered 2021-02-15 – 2021-02-17 (×7): 4 mg via INTRAVENOUS
  Filled 2021-02-15 (×8): qty 2

## 2021-02-15 NOTE — Assessment & Plan Note (Addendum)
Patient was having several loose bowel movements.  Also complaining of abdominal pain.  Abdominal/pelvis CT did not show any bowel inflammation.  C. difficile antigen came out to be positive without toxin.  Since patient was symptomatic with the diarrhea and abdominal pain, we started treatment . FOBT was found to be positive.  Hemoglobin is stable, so will not pursue any further work-up. Still having loose bowel movements,.  Continue vancomycin, added Imodium, continue fluids

## 2021-02-15 NOTE — NC FL2 (Addendum)
MEDICAID FL2 LEVEL OF CARE SCREENING TOOL     IDENTIFICATION  Patient Name: Andrea Kirk Birthdate: 1940-05-24 Sex: female Admission Date (Current Location): 02/13/2021  [facility] and [place] Number:  Herbalist and Address:  [facility] [place], [place] [address] [place], [place] [address]      Provider Number: [id number]  Attending Physician Name and Address:  Shelly Coss, MD  Relative Name and Phone Number:       Current Level of Care: Hospital Recommended Level of Care: [place] Prior Approval Number:    Date Approved/Denied:   PASRR Number:    Discharge Plan: SNF    Current Diagnoses: Patient Active Problem List   Diagnosis Date Noted   Elevated troponin 02/14/2021   Gram-negative bacteremia 02/14/2021   Severe sepsis ([place]) 02/14/2021   Thrombocytopenia ([place]) 02/13/2021   Acute encephalopathy 02/13/2021   Anxiety and depression 02/13/2021   Syncope, vasovagal 06/23/2020   Pulmonary hypertension ([place]) 04/20/2020   Controlled type 2 diabetes mellitus with hyperglycemia ([place]) 04/20/2020   Essential hypertension 04/20/2020   HLD (hyperlipidemia) 04/20/2020   Acute renal failure superimposed on stage 2 chronic kidney disease ([place]) 04/20/2020   Iron deficiency anemia 04/20/2020   Acute respiratory failure with hypoxia ([place]) 04/19/2020   Obese 04/19/2020   Chronic back pain 04/19/2020   Acute stroke due to ischemia [facility])    hx of CVA (cerebral vascular accident) ([place]) 04/16/2020   Macular retinoschisis, 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   Vitreomacular traction syndrome, 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   Depression 12/22/2017   Chronic pain 12/22/2017   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   Chronic diastolic CHF (congestive heart failure) ([place]) 12/22/2017   Frequent falls 07/16/2016   Carotid artery disease ([place]) 04/16/2013   Occlusion and stenosis of carotid artery without mention of cerebral infarction 07/19/2011     Orientation RESPIRATION BLADDER Height & Weight     Self  O2 ([place] 2L) Incontinent Weight: 168 lb 3.4 oz (76.3 kg) Height:  5\' 5"  (165.1 cm)  BEHAVIORAL SYMPTOMS/MOOD NEUROLOGICAL BOWEL NUTRITION STATUS      Incontinent Diet (see DC summary)  AMBULATORY STATUS COMMUNICATION OF NEEDS Skin   Extensive Assist Verbally Normal                       Personal Care Assistance Level of Assistance  Bathing, Dressing, Feeding Bathing Assistance: Maximum assistance Feeding assistance: Limited assistance Dressing Assistance: Maximum assistance     Functional Limitations Info  Sight, Hearing Sight Info: Impaired Hearing Info: Impaired      SPECIAL CARE FACTORS FREQUENCY   Contact Precautions Enteric precautions: Cdiff    Contractures Contractures Info: Not present    Additional Factors Info  Code Status, Allergies Code Status Info: Full Allergies Info: Codeine, Latex, Morphine And Related, Cyclobenzaprine, Methocarbamol           Current Medications (02/15/2021):  This is the current hospital active medication list Current Facility-Administered Medications  Medication Dose Route Frequency Provider Last Rate Last Admin   acetaminophen (TYLENOL) tablet 650 mg  650 mg Oral Q6H PRN Orma Flaming, MD   650 mg at 02/14/21 1400   Or   acetaminophen (TYLENOL) suppository 650 mg  650 mg Rectal Q6H PRN Orma Flaming, MD       carvedilol (COREG) tablet 12.5 mg  12.5 mg Oral BID Shelly Coss, MD   12.5 mg at 02/15/21 0914   cefTRIAXone (ROCEPHIN) 2 g in sodium chloride 0.9 %  100 mL IVPB  2 g Intravenous Q24H Laren Everts, RPH 200 mL/hr at 02/14/21 1411 2 g at 02/14/21 1411   hydrALAZINE (APRESOLINE) injection 5 mg  5 mg Intravenous Q4H PRN Rise Patience, MD   5 mg at 02/13/21 2324   labetalol (NORMODYNE) injection 10 mg  10 mg Intravenous Q2H PRN Shelly Coss, MD       MEDLINE mouth rinse  15 mL Mouth Rinse BID Adhikari, Amrit, MD       morphine (PF) 2 MG/ML injection  2 mg  2 mg Intravenous Q4H PRN Shelly Coss, MD   2 mg at 02/15/21 0502   vancomycin (VANCOCIN) capsule 125 mg  125 mg Oral QID Shelly Coss, MD   125 mg at 02/15/21 3335     Discharge Medications: Please see discharge summary for a list of discharge medications.  Relevant Imaging Results:  Relevant Lab Results:   Additional Information SS#: [id number]  Geralynn Ochs, LCSW

## 2021-02-15 NOTE — Progress Notes (Signed)
Echocardiogram 2D Echocardiogram has been performed.  Andrea Kirk 02/15/2021, 10:49 AM

## 2021-02-15 NOTE — Progress Notes (Signed)
Cdiff antigen positive. 2/3 occult positive.

## 2021-02-15 NOTE — TOC Initial Note (Signed)
Transition of Care [facility]) - Initial/Assessment Note    Patient Details  Name: Andrea Kirk MRN: [id number] Date of Birth: 1940/10/04  Transition of Care [facility]) CM/SW Contact:    Geralynn Ochs, LCSW Phone Number: 02/15/2021, 10:15 AM  Clinical Narrative:       Patient from [place], and can return when stable. CSW notified Ritta Slot of need for enteric precautions. CSW to follow.            Expected Discharge Plan: Skilled Nursing Facility Barriers to Discharge: Continued Medical Work up   Patient Goals and CMS Choice Patient states their goals for this hospitalization and ongoing recovery are:: patient unable to participate in goal setting, not oriented CMS Medicare.gov Compare Post Acute Care list provided to:: Patient Represenative (must comment) Choice offered to / list presented to : Sibling  Expected Discharge Plan and Services Expected Discharge Plan: [place] Acute Care Choice: [place] Living arrangements for the past 2 months: [place]                                      Prior Living Arrangements/Services Living arrangements for the past 2 months: [place] Lives with:: Facility Resident Patient language and need for interpreter reviewed:: No Do you feel safe going back to the place where you live?: Yes      Need for Family Participation in Patient Care: Yes (Comment) Care giver support system in place?: Yes (comment)   Criminal Activity/Legal Involvement Pertinent to Current Situation/Hospitalization: No - Comment as needed  Activities of Daily Living Home Assistive Devices/Equipment: Cane (specify quad or straight), Walker (specify type) ADL Screening (condition at time of admission) Patient's cognitive ability adequate to safely complete daily activities?: Yes Is the patient deaf or have difficulty hearing?: Yes Does the patient have difficulty seeing, even when  wearing glasses/contacts?: No Does the patient have difficulty concentrating, remembering, or making decisions?: No Patient able to express need for assistance with ADLs?: Yes Does the patient have difficulty dressing or bathing?: Yes Independently performs ADLs?: No Communication: Independent Dressing (OT): Needs assistance Is this a change from baseline?: Pre-admission baseline Grooming: Needs assistance Is this a change from baseline?: Pre-admission baseline Feeding: Independent Bathing: Needs assistance Is this a change from baseline?: Pre-admission baseline Toileting: Needs assistance Is this a change from baseline?: Pre-admission baseline In/Out Bed: Needs assistance Is this a change from baseline?: Pre-admission baseline Walks in Home: Needs assistance Is this a change from baseline?: Pre-admission baseline Does the patient have difficulty walking or climbing stairs?: Yes Weakness of Legs: Both Weakness of Arms/Hands: Both  Permission Sought/Granted Permission sought to share information with : Facility Sport and exercise psychologist, Family Supports Permission granted to share information with : Yes, Verbal Permission Granted  Share Information with NAME: Jori Moll  Permission granted to share info w AGENCY: [facility] Slot  Permission granted to share info w Relationship: Brother     Emotional Assessment   Attitude/Demeanor/Rapport: Unable to Assess Affect (typically observed): Unable to Assess Orientation: : Oriented to Self Alcohol / Substance Use: Not Applicable Psych Involvement: No (comment)  Admission diagnosis:  Transient alteration of awareness [R40.4] SOB (shortness of breath) [R06.02] Encephalopathy acute [G93.40] AKI (acute kidney injury) ([place]) [N17.9] Urinary tract infection without hematuria, site unspecified [N39.0] Hypotension due to hypovolemia [I95.89, E86.1] Patient Active Problem List   Diagnosis Date Noted   Elevated  troponin 02/14/2021   Gram-negative  bacteremia 02/14/2021   Severe sepsis ([place]) 02/14/2021   Thrombocytopenia ([place]) 02/13/2021   Acute encephalopathy 02/13/2021   Anxiety and depression 02/13/2021   Syncope, vasovagal 06/23/2020   Pulmonary hypertension ([place]) 04/20/2020   Controlled type 2 diabetes mellitus with hyperglycemia ([place]) 04/20/2020   Essential hypertension 04/20/2020   HLD (hyperlipidemia) 04/20/2020   Acute renal failure superimposed on stage 2 chronic kidney disease ([place]) 04/20/2020   Iron deficiency anemia 04/20/2020   Acute respiratory failure with hypoxia ([place]) 04/19/2020   Obese 04/19/2020   Chronic back pain 04/19/2020   Acute stroke due to ischemia [facility])    hx of CVA (cerebral vascular accident) ([place]) 04/16/2020   Macular retinoschisis, 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   Vitreomacular traction syndrome, 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   Depression 12/22/2017   Chronic pain 12/22/2017   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 [address] Phone: [phone] Fax: [phone]     Social Determinants of Health (SDOH) Interventions    Readmission Risk Interventions No flowsheet data found.

## 2021-02-15 NOTE — Progress Notes (Addendum)
PROGRESS NOTE  Andrea Kirk  QMV:[id number] DOB: 23-Mar-1940 DOA: 02/13/2021 PCP: Aura Dials, MD   Brief Narrative: Patient is a 81 year old female with history of CVA, coronary artery disease status post CABG, diastolic CHF, diabetes type 2, hypertension, hyperlipidemia, iron deficiency anemia, depression, CKD stage II who presented from skilled nursing facility due to altered mental status.  On presentation she was febrile, hypertensive, hypoxic on room air.  Lab work showed creatinine of 2, UA showed elevated leukocytes.  Chest x-ray showed mild bibasilar atelectasis or pneumonia, vascular congestion, small right pleural effusion.  Code sepsis was initiated in the emergency department and was given IV fluids.  Lab work also showed elevated troponin, cardiology consulted.  Blood cultures showed E. coli with source likely the urine.  She was having frequent diarrhea, C. difficile came out to positive.  Started on vancomycin.  Overall status including mental status have significantly improved   Assessment & Plan:  * Acute encephalopathy Mental status has improved -Metabolic encephalopathy secondary to sepsis -Head CT did not show any acute intracranial abnormalities. --Currently she is alert and oriented  Gram-negative bacteremia Urine culture and blood cultures showing gram-negative rods: E. coli.  Continue ceftriaxone 2 g daily.  We will follow-up final culture and sensitivity from blood culture.  Urine culture showed pansensitive E. Coli.  Anticipating that the organisms are the same  Severe sepsis ([place]) Initially febrile and hypotensive. Currently sepsis resolved improved.  Continue current antibiotics.   Elevated troponin Elevated troponin.  Jumped from 394 to 637.  Denies any chest pain.  Could be from supply demand ischemia from sepsis. cardiology consulted and following.  Echocardiogram as documented  Acute renal failure superimposed on stage 2 chronic kidney disease ([place])-  (present on admission) AKI has resolved  Essential hypertension- (present on admission) Initially hypertensive now hypertensive Restarted home Coreg Continue as needed medications for severe hypertension   Acute respiratory failure with hypoxia ([place])- (present on admission) As on her hospital discharge in 04/2020 after a stroke she was discharged on home oxygen 2L  Currently on 2 L of oxygen per minute.  We will try to wean the oxygen  Chronic diastolic CHF (congestive heart failure) ([place])- (present on admission) -She was initially started on IV fluids now stopped.  Elevated BNP.  Chest x-ray showed vascular congestion -Echo done during this hospitalization showed EF of 50-55% with grade 3 diastolic dysfunction, Severely elevated pulmonary artery pressure. -intake/output -Given IV Lasix 1 dose, now  held -Cardiology consulted and following  Thrombocytopenia ([place])- (present on admission) Low but stable Continue to monitor   Iron deficiency anemia- (present on admission) At her baseline continue to monitor   HLD (hyperlipidemia)- (present on admission) Not on statin, no allergy unsure why   Controlled type 2 diabetes mellitus with hyperglycemia ([place])- (present on admission) No recent labs Takes  Glipizide, currently on hold SSI and routine accuchecks   hx of CVA (cerebral vascular accident) ([place])- (present on admission) Head CT with no acute finding today  Continue ASA/brillinta   Carotid artery disease ([place])- (present on admission) On aspirin and Brilinta  C. difficile colitis Patient was having several loose bowel movements.  Also complaining of abdominal pain.  Abdominal/pelvis CT did not show any bowel inflammation.  C. difficile antigen came out to be positive without toxin.  Since patient was symptomatic with the diarrhea and abdominal pain, we will treat it with oral vancomycin. FOBT was found to be positive.  Hemoglobin is stable, so will not pursue any  further  work-up  Anxiety and depression On buspar/celexa We will resume on DC              DVT prophylaxis:Place and maintain sequential compression device Start: 02/14/21 2105 SCDs Start: 02/13/21 1451     Code Status: Full Code  Family Communication: Called and discussed with brother on phone on 2/9  Patient status:Inpatient  Anticipated discharge to:SNF   Consultants: Cardiology  Procedures: None  Antimicrobials:  Anti-infectives (From admission, onward)    Start     Dose/Rate Route Frequency Ordered Stop   02/15/21 1000  vancomycin (VANCOCIN) capsule 125 mg        125 mg Oral 4 times daily 02/15/21 0800 02/27/21 0959   02/14/21 1300  ceFEPIme (MAXIPIME) 2 g in sodium chloride 0.9 % 100 mL IVPB  Status:  Discontinued        2 g 200 mL/hr over 30 Minutes Intravenous Every 24 hours 02/13/21 1225 02/14/21 0423   02/14/21 1200  cefTRIAXone (ROCEPHIN) 2 g in sodium chloride 0.9 % 100 mL IVPB        2 g 200 mL/hr over 30 Minutes Intravenous Every 24 hours 02/14/21 0423     02/13/21 1224  vancomycin variable dose per unstable renal function (pharmacist dosing)  Status:  Discontinued         Does not apply See admin instructions 02/13/21 1225 02/14/21 0423   02/13/21 1130  ceFEPIme (MAXIPIME) 2 g in sodium chloride 0.9 % 100 mL IVPB        2 g 200 mL/hr over 30 Minutes Intravenous  Once 02/13/21 1123 02/13/21 1304   02/13/21 1130  vancomycin (VANCOREADY) IVPB 1500 mg/300 mL        1,500 mg 150 mL/hr over 120 Minutes Intravenous  Once 02/13/21 1123 02/13/21 1515   02/13/21 1115  metroNIDAZOLE (FLAGYL) IVPB 500 mg        500 mg 100 mL/hr over 60 Minutes Intravenous  Once 02/13/21 1101 02/13/21 1230       Subjective: Patient seen and examined the bedside this morning.  Hemodynamically stable.  She looks much better today.  Alert and oriented.  She denies any abdominal pain.  She is having loose bowel movements.  Objective: Vitals:   02/15/21 0445 02/15/21 0500 02/15/21  1156 02/15/21 1216  BP: (!) 149/73  (!) 148/84 140/64  Pulse: 69  94 61  Resp: 18  20 20   Temp: 99.1 F (37.3 C)  97.8 F (36.6 C) 99.3 F (37.4 C)  TempSrc: Axillary  Oral Oral  SpO2: 100%   98%  Weight:  76.3 kg    Height:        Intake/Output Summary (Last 24 hours) at 02/15/2021 1343 Last data filed at 02/15/2021 1000 Gross per 24 hour  Intake 580 ml  Output 950 ml  Net -370 ml   Filed Weights   02/13/21 1044 02/15/21 0500  Weight: 74.8 kg 76.3 kg    Examination:  General exam: Overall comfortable, not in distress, very deconditioned, chronically ill looking HEENT: PERRL Respiratory system:  no wheezes or crackles  Cardiovascular system: S1 & S2 heard, RRR.  Gastrointestinal system: Abdomen is nondistended, soft and nontender. Central nervous system: Alert and oriented Extremities: No edema, no clubbing ,no cyanosis Skin: No rashes, no ulcers,no icterus     Data Reviewed: I have personally reviewed following labs and imaging studies  CBC: Recent Labs  Lab 02/13/21 1103 02/13/21 1536 02/14/21 0116 02/15/21 0200  WBC 10.8*  --  11.0* 6.7  NEUTROABS 8.6*  --   --  5.9  HGB 11.3* 11.9* 13.5 11.3*  HCT 35.9* 35.0* 41.2 34.1*  MCV 107.8*  --  106.7* 103.6*  PLT 91*  --  86* 64*   Basic Metabolic Panel: Recent Labs  Lab 02/13/21 1103 02/13/21 1510 02/13/21 1536 02/14/21 0116 02/15/21 0200  NA 137  --  138 135 139  K 4.1  --  4.4 4.1 3.6  CL 105  --   --  102 103  CO2 23  --   --  20* 26  GLUCOSE 140*  --   --  181* 176*  BUN 50*  --   --  52* 40*  CREATININE 2.07*  --   --  1.67* 1.06*  CALCIUM 8.6*  --   --  9.2 8.9  MG  --  2.0  --   --   --      Recent Results (from the past 240 hour(s))  Blood Culture (routine x 2)     Status: None (Preliminary result)   Collection Time: 02/13/21 10:53 AM   Specimen: BLOOD  Result Value Ref Range Status   Specimen Description BLOOD RIGHT ANTECUBITAL  Final   Special Requests   Final    BOTTLES DRAWN  AEROBIC AND ANAEROBIC Blood Culture adequate volume   Culture  Setup Time   Final    GRAM NEGATIVE RODS AEROBIC BOTTLE ONLY CRITICAL VALUE NOTED.  VALUE IS CONSISTENT WITH PREVIOUSLY REPORTED AND CALLED VALUE.    Culture   Final    GRAM NEGATIVE RODS IDENTIFICATION TO FOLLOW Performed at [facility], [place] [address] [place], [place] [address]    Report Status PENDING  Incomplete  Resp Panel by RT-PCR (Flu A&B, Covid) Nasopharyngeal Swab     Status: None   Collection Time: 02/13/21 11:00 AM   Specimen: Nasopharyngeal Swab; Nasopharyngeal(NP) swabs in vial transport medium  Result Value Ref Range Status   SARS Coronavirus 2 by RT PCR NEGATIVE NEGATIVE Final    Comment: (NOTE) SARS-CoV-2 target nucleic acids are NOT DETECTED.  The SARS-CoV-2 RNA is generally detectable in upper respiratory specimens during the acute phase of infection. The lowest concentration of SARS-CoV-2 viral copies this assay can detect is 138 copies/mL. A negative result does not preclude SARS-Cov-2 infection and should not be used as the sole basis for treatment or other patient management decisions. A negative result may occur with  improper specimen collection/handling, submission of specimen other than nasopharyngeal swab, presence of viral mutation(s) within the areas targeted by this assay, and inadequate number of viral copies(<138 copies/mL). A negative result must be combined with clinical observations, patient history, and epidemiological information. The expected result is Negative.  Fact Sheet for Patients:  EntrepreneurPulse.com.au  Fact Sheet for Healthcare Providers:  IncredibleEmployment.be  This test is no t yet approved or cleared by the [place] FDA and  has been authorized for detection and/or diagnosis of SARS-CoV-2 by FDA under an Emergency Use Authorization (EUA). This EUA will remain  in effect (meaning this test can be used) for the  duration of the COVID-19 declaration under Section 564(b)(1) of the Act, 21 U.S.C.section 360bbb-3(b)(1), unless the authorization is terminated  or revoked sooner.       Influenza A by PCR NEGATIVE NEGATIVE Final   Influenza B by PCR NEGATIVE NEGATIVE Final    Comment: (NOTE) The Xpert Xpress SARS-CoV-2/FLU/RSV plus assay is intended as an aid in the diagnosis of influenza  from Nasopharyngeal swab specimens and should not be used as a sole basis for treatment. Nasal washings and aspirates are unacceptable for Xpert Xpress SARS-CoV-2/FLU/RSV testing.  Fact Sheet for Patients: EntrepreneurPulse.com.au  Fact Sheet for Healthcare Providers: IncredibleEmployment.be  This test is not yet approved or cleared by the [place] FDA and has been authorized for detection and/or diagnosis of SARS-CoV-2 by FDA under an Emergency Use Authorization (EUA). This EUA will remain in effect (meaning this test can be used) for the duration of the COVID-19 declaration under Section 564(b)(1) of the Act, 21 U.S.C. section 360bbb-3(b)(1), unless the authorization is terminated or revoked.  Performed at [facility], [place] [address] [place], [place] [address]   Blood Culture (routine x 2)     Status: Abnormal (Preliminary result)   Collection Time: 02/13/21 11:00 AM   Specimen: BLOOD  Result Value Ref Range Status   Specimen Description BLOOD SITE NOT SPECIFIED  Final   Special Requests   Final    BOTTLES DRAWN AEROBIC AND ANAEROBIC Blood Culture results may not be optimal due to an inadequate volume of blood received in culture bottles   Culture  Setup Time   Final    GRAM NEGATIVE RODS IN BOTH AEROBIC AND ANAEROBIC BOTTLES CRITICAL RESULT CALLED TO, READ BACK BY AND VERIFIED WITH: V BRYK,PHARMD@[id number]  02/14/21 [place]    Culture (A)  Final    ESCHERICHIA COLI SUSCEPTIBILITIES TO FOLLOW Performed at [facility], [place] [address], [place], [place]  [address]    Report Status PENDING  Incomplete  Blood Culture ID Panel (Reflexed)     Status: Abnormal   Collection Time: 02/13/21 11:00 AM  Result Value Ref Range Status   Enterococcus faecalis NOT DETECTED NOT DETECTED Final   Enterococcus Faecium NOT DETECTED NOT DETECTED Final   Listeria monocytogenes NOT DETECTED NOT DETECTED Final   Staphylococcus species NOT DETECTED NOT DETECTED Final   Staphylococcus aureus (BCID) NOT DETECTED NOT DETECTED Final   Staphylococcus epidermidis NOT DETECTED NOT DETECTED Final   Staphylococcus lugdunensis NOT DETECTED NOT DETECTED Final   Streptococcus species NOT DETECTED NOT DETECTED Final   Streptococcus agalactiae NOT DETECTED NOT DETECTED Final   Streptococcus pneumoniae NOT DETECTED NOT DETECTED Final   Streptococcus pyogenes NOT DETECTED NOT DETECTED Final   A.calcoaceticus-baumannii NOT DETECTED NOT DETECTED Final   Bacteroides fragilis NOT DETECTED NOT DETECTED Final   Enterobacterales DETECTED (A) NOT DETECTED Final    Comment: Enterobacterales represent a large order of gram negative bacteria, not a single organism. CRITICAL RESULT CALLED TO, READ BACK BY AND VERIFIED WITH: V BRYK,PHARMD@0401  02/14/21 [place]    Enterobacter cloacae complex NOT DETECTED NOT DETECTED Final   Escherichia coli DETECTED (A) NOT DETECTED Final    Comment: CRITICAL RESULT CALLED TO, READ BACK BY AND VERIFIED WITH: V BRYK,PHARMD@0401  02/14/21 [place]    Klebsiella aerogenes NOT DETECTED NOT DETECTED Final   Klebsiella oxytoca NOT DETECTED NOT DETECTED Final   Klebsiella pneumoniae NOT DETECTED NOT DETECTED Final   Proteus species NOT DETECTED NOT DETECTED Final   Salmonella species NOT DETECTED NOT DETECTED Final   Serratia marcescens NOT DETECTED NOT DETECTED Final   Haemophilus influenzae NOT DETECTED NOT DETECTED Final   Neisseria meningitidis NOT DETECTED NOT DETECTED Final   Pseudomonas aeruginosa NOT DETECTED NOT DETECTED Final   Stenotrophomonas maltophilia NOT  DETECTED NOT DETECTED Final   Candida albicans NOT DETECTED NOT DETECTED Final   Candida auris NOT DETECTED NOT DETECTED Final   Candida  glabrata NOT DETECTED NOT DETECTED Final   Candida krusei NOT DETECTED NOT DETECTED Final   Candida parapsilosis NOT DETECTED NOT DETECTED Final   Candida tropicalis NOT DETECTED NOT DETECTED Final   Cryptococcus neoformans/gattii NOT DETECTED NOT DETECTED Final   CTX-M ESBL NOT DETECTED NOT DETECTED Final   Carbapenem resistance IMP NOT DETECTED NOT DETECTED Final   Carbapenem resistance KPC NOT DETECTED NOT DETECTED Final   Carbapenem resistance NDM NOT DETECTED NOT DETECTED Final   Carbapenem resist OXA 48 LIKE NOT DETECTED NOT DETECTED Final   Carbapenem resistance VIM NOT DETECTED NOT DETECTED Final    Comment: Performed at [facility], [place] [address], [place], [place] [address]  Urine Culture     Status: Abnormal   Collection Time: 02/13/21 11:15 AM   Specimen: In/Out Cath Urine  Result Value Ref Range Status   Specimen Description IN/OUT CATH URINE  Final   Special Requests   Final    NONE Performed at [facility], [place] [address], [place], [place] [address]    Culture >=100,000 COLONIES/mL ESCHERICHIA COLI (A)  Final   Report Status 02/15/2021 FINAL  Final   Organism ID, Bacteria ESCHERICHIA COLI (A)  Final      Susceptibility   Escherichia coli - MIC*    AMPICILLIN <=2 SENSITIVE Sensitive     CEFAZOLIN <=4 SENSITIVE Sensitive     CEFEPIME <=0.12 SENSITIVE Sensitive     CEFTRIAXONE <=0.25 SENSITIVE Sensitive     CIPROFLOXACIN <=0.25 SENSITIVE Sensitive     GENTAMICIN <=1 SENSITIVE Sensitive     IMIPENEM <=0.25 SENSITIVE Sensitive     NITROFURANTOIN <=16 SENSITIVE Sensitive     TRIMETH/SULFA <=20 SENSITIVE Sensitive     AMPICILLIN/SULBACTAM <=2 SENSITIVE Sensitive     PIP/TAZO <=4 SENSITIVE Sensitive     * >=100,000 COLONIES/mL ESCHERICHIA COLI  MRSA Next Gen by PCR, Nasal     Status: None   Collection Time: 02/14/21   3:29 PM   Specimen: Nasal Mucosa; Nasal Swab  Result Value Ref Range Status   MRSA by PCR Next Gen NOT DETECTED NOT DETECTED Final    Comment: (NOTE) The GeneXpert MRSA Assay (FDA approved for NASAL specimens only), is one component of a comprehensive MRSA colonization surveillance program. It is not intended to diagnose MRSA infection nor to guide or monitor treatment for MRSA infections. Test performance is not FDA approved in patients less than 66 years old. Performed at [facility], [place] [address], [place], [place] [address]   C Difficile Quick Screen w PCR reflex     Status: Abnormal   Collection Time: 02/14/21  9:04 PM   Specimen: STOOL  Result Value Ref Range Status   C Diff antigen POSITIVE (A) NEGATIVE Final   C Diff toxin NEGATIVE NEGATIVE Final   C Diff interpretation Results are indeterminate. See PCR results.  Final    Comment: Performed at [facility], [place] [address] [place], [place] [address]  C. Diff by PCR, Reflexed     Status: Abnormal   Collection Time: 02/14/21  9:04 PM  Result Value Ref Range Status   Toxigenic C. Difficile by PCR POSITIVE (A) NEGATIVE Final    Comment: Positive for toxigenic C. difficile with little to no toxin production. Only treat if clinical presentation suggests symptomatic illness. Performed at [facility], [place] [address] [place], [place] [address]      Radiology Studies: CT ABDOMEN PELVIS WO CONTRAST  Result  Date: 02/14/2021 CLINICAL DATA:  Acute nonlocalized abdominal pain EXAM: CT ABDOMEN AND PELVIS WITHOUT CONTRAST TECHNIQUE: Multidetector CT imaging of the abdomen and pelvis was performed following the standard protocol without IV contrast. RADIATION DOSE REDUCTION: This exam was performed according to the departmental dose-optimization program which includes automated exposure control, adjustment of the mA and/or kV according to patient size and/or use of iterative reconstruction technique. COMPARISON:   07/21/2014 FINDINGS: Lower chest: Small right pleural effusion with basilar atelectasis or consolidation. Mild atelectasis in the left base. Small esophageal hiatal hernia. Esophageal wall is thickened suggesting reflux disease. Cardiac enlargement with coronary artery calcifications. Postoperative changes in the mediastinum. Hepatobiliary: No focal liver abnormality is seen. No gallstones, gallbladder wall thickening, or biliary dilatation. Pancreas: Unremarkable. No pancreatic ductal dilatation or surrounding inflammatory changes. Spleen: Normal in size without focal abnormality. Adrenals/Urinary Tract: No adrenal gland nodules. Tiny stones in the left kidney measuring less than 2 mm diameter. No hydronephrosis or hydroureter. No ureteral stones or bladder stones. No bladder wall thickening. Stomach/Bowel: Stomach, small bowel, and colon are not abnormally distended. Scattered colonic diverticula without evidence of acute diverticulitis. Appendix is not identified. Vascular/Lymphatic: Aortic atherosclerosis. No enlarged abdominal or pelvic lymph nodes. Reproductive: Uterus and bilateral adnexa are unremarkable. Other: Small amount of free fluid in the pelvis and pericolic gutters with stranding in the pararenal retroperitoneal fat and in the mesentery. Changes may be inflammatory or edema. Consider possibility of pyelonephritis. Musculoskeletal: Degenerative changes in the spine. Postoperative fixation at L4-5. IMPRESSION: 1. Small right pleural effusion. Basilar atelectasis or consolidation in both lung bases, greater on the right. 2. Small esophageal hiatal hernia with esophageal wall thickening suggesting reflux disease. 3. Nonobstructing intrarenal stones in the left kidney. 4. Diffuse edema and stranding in the pararenal fat and along the pericolic gutters into the pelvis. Slight ascites. Consider edema or inflammatory process such is pyelonephritis. 5. No evidence of bowel obstruction or inflammation. 6.  Aortic atherosclerosis. Electronically Signed   By: Lucienne Capers M.D.   On: 02/14/2021 21:49   CT HEAD WO CONTRAST (5MM)  Result Date: 02/13/2021 CLINICAL DATA:  Altered mental status. EXAM: CT HEAD WITHOUT CONTRAST TECHNIQUE: Contiguous axial images were obtained from the base of the skull through the vertex without intravenous contrast. RADIATION DOSE REDUCTION: This exam was performed according to the departmental dose-optimization program which includes automated exposure control, adjustment of the mA and/or kV according to patient size and/or use of iterative reconstruction technique. COMPARISON:  CT head dated November 09, 2020. FINDINGS: Brain: No evidence of acute infarction, hemorrhage, hydrocephalus, extra-axial collection or mass lesion/mass effect. Stable mild chronic microvascular ischemic changes. Vascular: Calcified atherosclerosis at the skull base. No hyperdense vessel. Skull: Negative for fracture or focal lesion. Sinuses/Orbits: No acute finding. Other: None. IMPRESSION: 1. No acute intracranial abnormality. Electronically Signed   By: Titus Dubin M.D.   On: 02/13/2021 15:31   DG CHEST PORT 1 VIEW  Result Date: 02/13/2021 CLINICAL DATA:  Altered mental status EXAM: PORTABLE CHEST 1 VIEW COMPARISON:  02/13/2021 FINDINGS: Prior CABG. Cardiomegaly with vascular congestion. Interstitial prominence may reflect interstitial edema. No effusions or acute bony abnormality. IMPRESSION: Cardiomegaly with vascular congestion and probable early interstitial edema. Electronically Signed   By: Rolm Baptise M.D.   On: 02/13/2021 22:02   ECHOCARDIOGRAM COMPLETE  Result Date: 02/15/2021    ECHOCARDIOGRAM REPORT   Patient Name:   FOY MUNGIA Maranan Date of Exam: 02/15/2021 Medical Rec #:  [id number]     Height:  65.0 in Accession #:    [id number]    Weight:       168.2 lb Date of Birth:  [birth year]      BSA:          1.838 m Patient Age:    63 years      BP:           149/73 mmHg Patient Gender: F              HR:           69 bpm. Exam Location:  Inpatient Procedure: 2D Echo Indications:    CHF  History:        Patient has prior history of Echocardiogram examinations, most                 recent 04/16/2020. CAD; Risk Factors:Hypertension, Diabetes and                 Dyslipidemia.  Sonographer:    Arlyss Gandy Referring Phys: [place]  1. Left ventricular ejection fraction, by estimation, is 50 to 55%. The left ventricle has low normal function. The left ventricle has no regional wall motion abnormalities. There is mild concentric left ventricular hypertrophy. Left ventricular diastolic parameters are consistent with Grade III diastolic dysfunction (restrictive). Elevated left ventricular end-diastolic pressure.  2. Right ventricular systolic function is normal. The right ventricular size is mildly enlarged. There is severely elevated pulmonary artery systolic pressure. The estimated right ventricular systolic pressure is 40.8 mmHg.  3. Left atrial size was severely dilated.  4. Right atrial size was moderately dilated.  5. The mitral valve is grossly normal. Mild mitral valve regurgitation. No evidence of mitral stenosis. Moderate mitral annular calcification.  6. Tricuspid valve regurgitation is moderate.  7. The aortic valve is tricuspid. There is mild calcification of the aortic valve. There is mild thickening of the aortic valve. Aortic valve regurgitation is not visualized. Aortic valve sclerosis/calcification is present, without any evidence of aortic stenosis.  8. The inferior vena cava is normal in size with <50% respiratory variability, suggesting right atrial pressure of 8 mmHg. Comparison(s): No significant change from prior study. Prior images reviewed side by side. Conclusion(s)/Recommendation(s): Otherwise normal echocardiogram, with minor abnormalities described in the report. Severely elevated right sided pressures, restrictive LV filling (grade 3 diastolic dysfunction).  FINDINGS  Left Ventricle: Left ventricular ejection fraction, by estimation, is 50 to 55%. The left ventricle has low normal function. The left ventricle has no regional wall motion abnormalities. The left ventricular internal cavity size was normal in size. There is mild concentric left ventricular hypertrophy. Left ventricular diastolic parameters are consistent with Grade III diastolic dysfunction (restrictive). Elevated left ventricular end-diastolic pressure. Right Ventricle: The right ventricular size is mildly enlarged. Right vetricular wall thickness was not well visualized. Right ventricular systolic function is normal. There is severely elevated pulmonary artery systolic pressure. The tricuspid regurgitant velocity is 3.61 m/s, and with an assumed right atrial pressure of 8 mmHg, the estimated right ventricular systolic pressure is 14.4 mmHg. Left Atrium: Left atrial size was severely dilated. Right Atrium: Right atrial size was moderately dilated. Pericardium: There is no evidence of pericardial effusion. Mitral Valve: The mitral valve is grossly normal. There is mild thickening of the mitral valve leaflet(s). There is mild calcification of the mitral valve leaflet(s). Moderate mitral annular calcification. Mild mitral valve regurgitation. No evidence of mitral valve stenosis. MV peak gradient, 10.9 mmHg. The mean mitral valve gradient  is 3.0 mmHg. Tricuspid Valve: The tricuspid valve is normal in structure. Tricuspid valve regurgitation is moderate . No evidence of tricuspid stenosis. Aortic Valve: The aortic valve is tricuspid. There is mild calcification of the aortic valve. There is mild thickening of the aortic valve. Aortic valve regurgitation is not visualized. Aortic valve sclerosis/calcification is present, without any evidence of aortic stenosis. Aortic valve mean gradient measures 9.0 mmHg. Aortic valve peak gradient measures 15.7 mmHg. Aortic valve area, by VTI measures 2.22 cm. Pulmonic  Valve: The pulmonic valve was not well visualized. Pulmonic valve regurgitation is mild. No evidence of pulmonic stenosis. Aorta: The aortic root, ascending aorta and aortic arch are all structurally normal, with no evidence of dilitation or obstruction. Venous: The inferior vena cava is normal in size with less than 50% respiratory variability, suggesting right atrial pressure of 8 mmHg. IAS/Shunts: The atrial septum is grossly normal.  LEFT VENTRICLE PLAX 2D LVIDd:         4.40 cm   Diastology LVIDs:         3.10 cm   LV e' medial:    7.72 cm/s LV PW:         1.10 cm   LV E/e' medial:  17.6 LV IVS:        1.10 cm   LV e' lateral:   10.80 cm/s LVOT diam:     2.00 cm   LV E/e' lateral: 12.6 LV SV:         95 LV SV Index:   51 LVOT Area:     3.14 cm  RIGHT VENTRICLE            IVC RV Basal diam:  4.20 cm    IVC diam: 2.00 cm RV Mid diam:    3.20 cm RV S prime:     8.16 cm/s TAPSE (M-mode): 1.4 cm LEFT ATRIUM              Index        RIGHT ATRIUM           Index LA diam:        4.10 cm  2.23 cm/m   RA Area:     22.60 cm LA Vol (A2C):   108.0 ml 58.76 ml/m  RA Volume:   66.90 ml  36.40 ml/m LA Vol (A4C):   77.4 ml  42.11 ml/m LA Biplane Vol: 91.6 ml  49.84 ml/m  AORTIC VALVE AV Area (Vmax):    2.43 cm AV Area (Vmean):   2.23 cm AV Area (VTI):     2.22 cm AV Vmax:           198.00 cm/s AV Vmean:          141.000 cm/s AV VTI:            0.425 m AV Peak Grad:      15.7 mmHg AV Mean Grad:      9.0 mmHg LVOT Vmax:         153.00 cm/s LVOT Vmean:        100.000 cm/s LVOT VTI:          0.301 m LVOT/AV VTI ratio: 0.71  AORTA Ao Root diam: 3.20 cm Ao Asc diam:  3.40 cm MITRAL VALVE                TRICUSPID VALVE MV Area (PHT): 3.60 cm     TR Peak grad:   52.1 mmHg MV Area VTI:  2.46 cm     TR Vmax:        361.00 cm/s MV Peak grad:  10.9 mmHg MV Mean grad:  3.0 mmHg     SHUNTS MV Vmax:       1.65 m/s     Systemic VTI:  0.30 m MV Vmean:      70.7 cm/s    Systemic Diam: 2.00 cm MV Decel Time: 211 msec MV E velocity:  136.00 cm/s MV A velocity: 44.00 cm/s MV E/A ratio:  3.09 Buford Dresser MD Electronically signed by Buford Dresser MD Signature Date/Time: 02/15/2021/1:03:58 PM    Final     Scheduled Meds:  carvedilol  12.5 mg Oral BID   mouth rinse  15 mL Mouth Rinse BID   vancomycin  125 mg Oral QID   Continuous Infusions:  cefTRIAXone (ROCEPHIN)  IV 2 g (02/15/21 1301)     LOS: 2 days   Shelly Coss, MD [facility] P2/09/2021, 1:43 PM

## 2021-02-15 NOTE — Progress Notes (Signed)
Speech Language Pathology Treatment: Dysphagia  Patient Details Name: Andrea Kirk MRN: [id number] DOB: 1940-08-05 Today's Date: 02/15/2021 Time: 1135-1150 SLP Time Calculation (min) (ACUTE ONLY): 15 min  Assessment / Plan / Recommendation Clinical Impression  Ms. [name] was seen at bedside for dysphagia treatment. She was cooperative and pleasant throughout the session with improved alertness from the last targeted session. Pt had untouched breakfast Dys 1 tray; verbalized "yuck" when asked if she wanted it. Pt tolerated puree textures well with adequate oral clearance and no overt s/sx of aspiration. Pt drank ~2 oz of thin liquids consecutively with no overt s/sx of aspiration including throat clearing and coughing. SLP branched up to regular solids and pt used front teeth to nibble on graham cracker. Pt verbalized beginning to feel sick to her stomach and SLP ended session. Pt reports that she typically eats regular solids and thin liquids. 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 There are no present concerns for aspiration at this time. SLP to sign off.   HPI HPI: Patient is a 81 year old female with history of CVA, coronary artery disease status post CABG, diastolic CHF, diabetes type 2, hypertension, hyperlipidemia, iron deficiency anemia, depression, CKD stage II who presented from skilled nursing facility due to altered mental status.  On presentation she was febrile, hypertensive, hypoxic on room air.  Lab work showed creatinine of 2, UA showed elevated leukocytes.  Chest x-ray showed mild bibasilar atelectasis or pneumonia, vascular congestion, small right pleural effusion.      SLP Plan  All goals met      Recommendations for follow up therapy are one component of a multi-disciplinary discharge planning process, led by the attending physician.  Recommendations may be updated  based on patient status, additional functional criteria and insurance authorization.    Recommendations  Diet recommendations: Regular;Thin liquid Liquids provided via: Straw Medication Administration: Crushed with puree Supervision: Staff to assist with self feeding Compensations: Minimize environmental distractions;Small sips/bites Postural Changes and/or Swallow Maneuvers: Seated upright 90 degrees                Oral Care Recommendations: Oral care BID Follow Up Recommendations: Skilled nursing-short term rehab (<3 hours/day) Assistance recommended at discharge: Frequent or constant Supervision/Assistance SLP Visit Diagnosis: Dysphagia, unspecified (R13.10) Plan: All goals met           Vaughan Sine  02/15/2021, 11:53 AM

## 2021-02-16 LAB — CULTURE, BLOOD (ROUTINE X 2)

## 2021-02-16 LAB — BASIC METABOLIC PANEL
Anion gap: 8 (ref 5–15)
BUN: 36 mg/dL — ABNORMAL HIGH (ref 8–23)
CO2: 28 mmol/L (ref 22–32)
Calcium: 9.1 mg/dL (ref 8.9–10.3)
Chloride: 103 mmol/L (ref 98–111)
Creatinine, Ser: 0.92 mg/dL (ref 0.44–1.00)
GFR, Estimated: >60 mL/min (ref >60)
Glucose, Bld: 144 mg/dL — ABNORMAL HIGH (ref 70–99)
Potassium: 3.7 mmol/L (ref 3.5–5.1)
Sodium: 139 mmol/L (ref 135–145)

## 2021-02-16 LAB — CBC WITH DIFFERENTIAL/PLATELET
Abs Immature Granulocytes: 0.02 10*3/uL (ref 0.00–0.07)
Basophils Absolute: 0 10*3/uL (ref 0.0–0.1)
Basophils Relative: 1 %
Eosinophils Absolute: 0 10*3/uL (ref 0.0–0.5)
Eosinophils Relative: 1 %
HCT: 32 % — ABNORMAL LOW (ref 36.0–46.0)
Hemoglobin: 10.1 g/dL — ABNORMAL LOW (ref 12.0–15.0)
Immature Granulocytes: 0 %
Lymphocytes Relative: 13 %
Lymphs Abs: 0.8 10*3/uL (ref 0.7–4.0)
MCH: 33.3 pg (ref 26.0–34.0)
MCHC: 31.6 g/dL (ref 30.0–36.0)
MCV: 105.6 fL — ABNORMAL HIGH (ref 80.0–100.0)
Monocytes Absolute: 0.7 10*3/uL (ref 0.1–1.0)
Monocytes Relative: 12 %
Neutro Abs: 4.4 10*3/uL (ref 1.7–7.7)
Neutrophils Relative %: 73 %
Platelets: 59 10*3/uL — ABNORMAL LOW (ref 150–400)
RBC: 3.03 MIL/uL — ABNORMAL LOW (ref 3.87–5.11)
RDW: 14.7 % (ref 11.5–15.5)
Smear Review: DECREASED
WBC Morphology: INCREASED
WBC: 6 10*3/uL (ref 4.0–10.5)
nRBC: 0 % (ref 0.0–0.2)

## 2021-02-16 MED ORDER — SODIUM CHLORIDE 0.9 % IV SOLN: INTRAVENOUS | Status: DC

## 2021-02-16 MED ORDER — CEFADROXIL 500 MG PO CAPS
1000.0000 mg | ORAL_CAPSULE | Freq: Two times a day (BID) | ORAL | Status: AC
  Administered 2021-02-16 – 2021-02-19 (×8): 1000 mg via ORAL
  Filled 2021-02-16 (×8): qty 2

## 2021-02-16 MED ORDER — PROCHLORPERAZINE EDISYLATE 10 MG/2ML IJ SOLN
10.0000 mg | Freq: Four times a day (QID) | INTRAMUSCULAR | Status: DC | PRN
  Administered 2021-02-16 – 2021-02-17 (×3): 10 mg via INTRAVENOUS
  Filled 2021-02-16 (×3): qty 2

## 2021-02-16 NOTE — Care Management Important Message (Signed)
Important Message  Patient Details  Name: Andrea Kirk MRN: [id number] Date of Birth: Jul 13, 1940   Medicare Important Message Given:  Yes     Hannah Beat 02/16/2021, 2:41 PM

## 2021-02-16 NOTE — Progress Notes (Addendum)
PROGRESS NOTE  Andrea Kirk  ZOX:[id number] DOB: 1940-07-08 DOA: 02/13/2021 PCP: Aura Dials, MD   Brief Narrative: Patient is a 81 year old female with history of CVA, coronary artery disease status post CABG, diastolic CHF, diabetes type 2, hypertension, hyperlipidemia, iron deficiency anemia, depression, CKD stage II who presented from skilled nursing facility due to altered mental status.  On presentation she was febrile, hypertensive, hypoxic on room air.  Lab work showed creatinine of 2, UA showed elevated leukocytes.  Chest x-ray showed mild bibasilar atelectasis or pneumonia, vascular congestion, small right pleural effusion.  Code sepsis was initiated in the emergency department and was given IV fluids.  Lab work also showed elevated troponin, cardiology consulted.  Blood cultures showed E. coli with source likely the urine.  She was having frequent diarrhea, C. difficile came out to positive.  Started on vancomycin.  Overall status including mental status have significantly improved.  Plan is to discharge back to skilled nursing facility when appropriate   Assessment & Plan:  * Acute encephalopathy Mental status has improved -Metabolic encephalopathy secondary to sepsis -Head CT did not show any acute intracranial abnormalities. --Currently she is alert and oriented  Gram-negative bacteremia Urine culture and blood cultures showing gram-negative rods: E. coli.  She was on ceftriaxone 2 g daily.Cx showed  pansensitive E. Coli.  Antibiotics changed to cefadroxil for total of 7 days course  Severe sepsis ([place]) Initially febrile and hypotensive. Currently sepsis resolved improved.  Continue current antibiotics.   Elevated troponin Elevated troponin.  Jumped from 394 to 637.  No report of chest pain.  Could be from supply demand ischemia from sepsis. cardiology was consulted, no plan for any further work-up.  Echocardiogram as documented  Acute renal failure superimposed on stage 2  chronic kidney disease ([place])- (present on admission) AKI has resolved  Essential hypertension- (present on admission) Restarted home Coreg Continue as needed medications for severe hypertension.  Currently she is normotensive   Acute respiratory failure with hypoxia ([place])- (present on admission) As on her hospital discharge in 04/2020 after a stroke she was discharged on home oxygen 2L  Currently on 2 L of oxygen per minute.  We will try to wean the oxygen  Chronic diastolic CHF (congestive heart failure) ([place])- (present on admission) -She was initially started on IV fluids now stopped.  Elevated BNP.  Chest x-ray showed vascular congestion -Echo done during this hospitalization showed EF of 50-55% with grade 3 diastolic dysfunction, Severely elevated pulmonary artery pressure. -intake/output -Given IV Lasix 1 dose, now  held -Cardiology was consulted   Thrombocytopenia ([place])- (present on admission) Unclear etiology but most likely secondary to bacteremia/sepsis.  We will continue to monitor.  No medications identified for the cause of potential thrombocytopenic effect  Iron deficiency anemia- (present on admission) There was report of passage of dark stool.  FOBT was positive.  On checking her previous lab works, her hemoglobin is in the range of 10.  Currently hemoglobin at baseline.  No further work-up planned as patient has C. difficile   HLD (hyperlipidemia)- (present on admission) Not on statin, no allergy unsure why   Controlled type 2 diabetes mellitus with hyperglycemia ([place])- (present on admission) No recent labs Takes  Glipizide, currently on hold SSI and routine accuchecks   hx of CVA (cerebral vascular accident) ([place])- (present on admission) Head CT with no acute finding today  Takes ASA/brillinta at home  Carotid artery disease ([place])- (present on admission) On aspirin and Brilinta at home, now on  hold due to positive FOBT.  Her hemoglobin is at baseline.  We will  resume this medications on discharge  C. difficile colitis Patient was having several loose bowel movements.  Also complaining of abdominal pain.  Abdominal/pelvis CT did not show any bowel inflammation.  C. difficile antigen came out to be positive without toxin.  Since patient was symptomatic with the diarrhea and abdominal pain, we started treatment . FOBT was found to be positive.  Hemoglobin is stable, so will not pursue any further work-up  Anxiety and depression On buspar/celexa We will resume on DC              DVT prophylaxis:Place and maintain sequential compression device Start: 02/14/21 2105 SCDs Start: 02/13/21 1451     Code Status: Full Code  Family Communication: Called and discussed with brother on phone on 2/9  Patient status:Inpatient  Patient is from: Skilled nursing facility  Anticipated discharge to:SNF  Estimated discharge date: Likely tomorrow   Consultants: Cardiology  Procedures: None  Antimicrobials:  Anti-infectives (From admission, onward)    Start     Dose/Rate Route Frequency Ordered Stop   02/16/21 1000  cefadroxil (DURICEF) capsule 1,000 mg        1,000 mg Oral 2 times daily 02/16/21 0741 02/19/21 2359   02/15/21 1000  vancomycin (VANCOCIN) capsule 125 mg        125 mg Oral 4 times daily 02/15/21 0800 02/27/21 0959   02/14/21 1300  ceFEPIme (MAXIPIME) 2 g in sodium chloride 0.9 % 100 mL IVPB  Status:  Discontinued        2 g 200 mL/hr over 30 Minutes Intravenous Every 24 hours 02/13/21 1225 02/14/21 0423   02/14/21 1200  cefTRIAXone (ROCEPHIN) 2 g in sodium chloride 0.9 % 100 mL IVPB  Status:  Discontinued        2 g 200 mL/hr over 30 Minutes Intravenous Every 24 hours 02/14/21 0423 02/16/21 0741   02/13/21 1224  vancomycin variable dose per unstable renal function (pharmacist dosing)  Status:  Discontinued         Does not apply See admin instructions 02/13/21 1225 02/14/21 0423   02/13/21 1130  ceFEPIme (MAXIPIME) 2 g in sodium  chloride 0.9 % 100 mL IVPB        2 g 200 mL/hr over 30 Minutes Intravenous  Once 02/13/21 1123 02/13/21 1304   02/13/21 1130  vancomycin (VANCOREADY) IVPB 1500 mg/300 mL        1,500 mg 150 mL/hr over 120 Minutes Intravenous  Once 02/13/21 1123 02/13/21 1515   02/13/21 1115  metroNIDAZOLE (FLAGYL) IVPB 500 mg        500 mg 100 mL/hr over 60 Minutes Intravenous  Once 02/13/21 1101 02/13/21 1230       Subjective: Patient seen and examined at the bedside this morning.  Hemodynamically stable.  Diarrhea has slowed down.  Still looks overall comfortable but remains very weak and deconditioned.  She denies any specific complaints today.  Complains of some intermittent nausea  Objective: Vitals:   02/16/21 0356 02/16/21 0529 02/16/21 0533 02/16/21 0751  BP: (!) 173/80  132/62 125/69  Pulse: (!) 57   (!) 58  Resp: 19 15 14 16   Temp: 98.4 F (36.9 C)   97.9 F (36.6 C)  TempSrc: Oral   Oral  SpO2: 100%  97% 98%  Weight:      Height:        Intake/Output Summary (Last 24 hours) at 02/16/2021  [place] filed at 02/15/2021 1757 Gross per 24 hour  Intake 100 ml  Output 400 ml  Net -300 ml   Filed Weights   02/13/21 1044 02/15/21 0500  Weight: 74.8 kg 76.3 kg    Examination:  General exam: Deconditioned, chronically ill looking HEENT: PERRL Respiratory system:  no wheezes or crackles  Cardiovascular system: S1 & S2 heard, RRR.  Gastrointestinal system: Abdomen is nondistended, soft and nontender. Central nervous system: Alert and oriented Extremities: No edema, no clubbing ,no cyanosis Skin: No rashes, no ulcers,no icterus    Data Reviewed: I have personally reviewed following labs and imaging studies  CBC: Recent Labs  Lab 02/13/21 1103 02/13/21 1536 02/14/21 0116 02/15/21 0200 02/16/21 0048  WBC 10.8*  --  11.0* 6.7 6.0  NEUTROABS 8.6*  --   --  5.9 4.4  HGB 11.3* 11.9* 13.5 11.3* 10.1*  HCT 35.9* 35.0* 41.2 34.1* 32.0*  MCV 107.8*  --  106.7* 103.6* 105.6*   PLT 91*  --  86* 64* 59*   Basic Metabolic Panel: Recent Labs  Lab 02/13/21 1103 02/13/21 1510 02/13/21 1536 02/14/21 0116 02/15/21 0200 02/16/21 0048  NA 137  --  138 135 139 139  K 4.1  --  4.4 4.1 3.6 3.7  CL 105  --   --  102 103 103  CO2 23  --   --  20* 26 28  GLUCOSE 140*  --   --  181* 176* 144*  BUN 50*  --   --  52* 40* 36*  CREATININE 2.07*  --   --  1.67* 1.06* 0.92  CALCIUM 8.6*  --   --  9.2 8.9 9.1  MG  --  2.0  --   --   --   --      Recent Results (from the past 240 hour(s))  Blood Culture (routine x 2)     Status: Abnormal (Preliminary result)   Collection Time: 02/13/21 10:53 AM   Specimen: BLOOD  Result Value Ref Range Status   Specimen Description BLOOD RIGHT ANTECUBITAL  Final   Special Requests   Final    BOTTLES DRAWN AEROBIC AND ANAEROBIC Blood Culture adequate volume   Culture  Setup Time   Final    GRAM NEGATIVE RODS IN BOTH AEROBIC AND ANAEROBIC BOTTLES CRITICAL VALUE NOTED.  VALUE IS CONSISTENT WITH PREVIOUSLY REPORTED AND CALLED VALUE.    Culture (A)  Final    ESCHERICHIA COLI SUSCEPTIBILITIES PERFORMED ON PREVIOUS CULTURE WITHIN THE LAST 5 DAYS. Performed at [facility], [place] [address], [place], [place] [address]    Report Status PENDING  Incomplete  Resp Panel by RT-PCR (Flu A&B, Covid) Nasopharyngeal Swab     Status: None   Collection Time: 02/13/21 11:00 AM   Specimen: Nasopharyngeal Swab; Nasopharyngeal(NP) swabs in vial transport medium  Result Value Ref Range Status   SARS Coronavirus 2 by RT PCR NEGATIVE NEGATIVE Final    Comment: (NOTE) SARS-CoV-2 target nucleic acids are NOT DETECTED.  The SARS-CoV-2 RNA is generally detectable in upper respiratory specimens during the acute phase of infection. The lowest concentration of SARS-CoV-2 viral copies this assay can detect is 138 copies/mL. A negative result does not preclude SARS-Cov-2 infection and should not be used as the sole basis for treatment or other patient  management decisions. A negative result may occur with  improper specimen collection/handling, submission of specimen other than nasopharyngeal swab, presence of viral mutation(s) within the areas targeted  by this assay, and inadequate number of viral copies(<138 copies/mL). A negative result must be combined with clinical observations, patient history, and epidemiological information. The expected result is Negative.  Fact Sheet for Patients:  EntrepreneurPulse.com.au  Fact Sheet for Healthcare Providers:  IncredibleEmployment.be  This test is no t yet approved or cleared by the [place] FDA and  has been authorized for detection and/or diagnosis of SARS-CoV-2 by FDA under an Emergency Use Authorization (EUA). This EUA will remain  in effect (meaning this test can be used) for the duration of the COVID-19 declaration under Section 564(b)(1) of the Act, 21 U.S.C.section 360bbb-3(b)(1), unless the authorization is terminated  or revoked sooner.       Influenza A by PCR NEGATIVE NEGATIVE Final   Influenza B by PCR NEGATIVE NEGATIVE Final    Comment: (NOTE) The Xpert Xpress SARS-CoV-2/FLU/RSV plus assay is intended as an aid in the diagnosis of influenza from Nasopharyngeal swab specimens and should not be used as a sole basis for treatment. Nasal washings and aspirates are unacceptable for Xpert Xpress SARS-CoV-2/FLU/RSV testing.  Fact Sheet for Patients: EntrepreneurPulse.com.au  Fact Sheet for Healthcare Providers: IncredibleEmployment.be  This test is not yet approved or cleared by the [place] FDA and has been authorized for detection and/or diagnosis of SARS-CoV-2 by FDA under an Emergency Use Authorization (EUA). This EUA will remain in effect (meaning this test can be used) for the duration of the COVID-19 declaration under Section 564(b)(1) of the Act, 21 U.S.C. section 360bbb-3(b)(1),  unless the authorization is terminated or revoked.  Performed at [facility], [place] [address] [place], [place] [address]   Blood Culture (routine x 2)     Status: Abnormal   Collection Time: 02/13/21 11:00 AM   Specimen: BLOOD  Result Value Ref Range Status   Specimen Description BLOOD SITE NOT SPECIFIED  Final   Special Requests   Final    BOTTLES DRAWN AEROBIC AND ANAEROBIC Blood Culture results may not be optimal due to an inadequate volume of blood received in culture bottles   Culture  Setup Time   Final    GRAM NEGATIVE RODS IN BOTH AEROBIC AND ANAEROBIC BOTTLES CRITICAL RESULT CALLED TO, READ BACK BY AND VERIFIED WITH: V BRYK,PHARMD@[id number]  02/14/21 [place] Performed at [facility], [address], [place], [place] [address]    Culture ESCHERICHIA COLI (A)  Final   Report Status 02/16/2021 FINAL  Final   Organism ID, Bacteria ESCHERICHIA COLI  Final      Susceptibility   Escherichia coli - MIC*    AMPICILLIN <=2 SENSITIVE Sensitive     CEFAZOLIN <=4 SENSITIVE Sensitive     CEFEPIME <=0.12 SENSITIVE Sensitive     CEFTAZIDIME <=1 SENSITIVE Sensitive     CEFTRIAXONE <=0.25 SENSITIVE Sensitive     CIPROFLOXACIN <=0.25 SENSITIVE Sensitive     GENTAMICIN <=1 SENSITIVE Sensitive     IMIPENEM <=0.25 SENSITIVE Sensitive     TRIMETH/SULFA <=20 SENSITIVE Sensitive     AMPICILLIN/SULBACTAM <=2 SENSITIVE Sensitive     PIP/TAZO <=4 SENSITIVE Sensitive     * ESCHERICHIA COLI  Blood Culture ID Panel (Reflexed)     Status: Abnormal   Collection Time: 02/13/21 11:00 AM  Result Value Ref Range Status   Enterococcus faecalis NOT DETECTED NOT DETECTED Final   Enterococcus Faecium NOT DETECTED NOT DETECTED Final   Listeria monocytogenes NOT DETECTED NOT DETECTED Final   Staphylococcus species NOT DETECTED NOT DETECTED Final   Staphylococcus aureus (BCID)  NOT DETECTED NOT DETECTED Final   Staphylococcus epidermidis NOT DETECTED NOT DETECTED Final   Staphylococcus lugdunensis NOT  DETECTED NOT DETECTED Final   Streptococcus species NOT DETECTED NOT DETECTED Final   Streptococcus agalactiae NOT DETECTED NOT DETECTED Final   Streptococcus pneumoniae NOT DETECTED NOT DETECTED Final   Streptococcus pyogenes NOT DETECTED NOT DETECTED Final   A.calcoaceticus-baumannii NOT DETECTED NOT DETECTED Final   Bacteroides fragilis NOT DETECTED NOT DETECTED Final   Enterobacterales DETECTED (A) NOT DETECTED Final    Comment: Enterobacterales represent a large order of gram negative bacteria, not a single organism. CRITICAL RESULT CALLED TO, READ BACK BY AND VERIFIED WITH: V BRYK,PHARMD@[id number]  02/14/21 [place]    Enterobacter cloacae complex NOT DETECTED NOT DETECTED Final   Escherichia coli DETECTED (A) NOT DETECTED Final    Comment: CRITICAL RESULT CALLED TO, READ BACK BY AND VERIFIED WITH: V BRYK,PHARMD@[id number]  02/14/21 [place]    Klebsiella aerogenes NOT DETECTED NOT DETECTED Final   Klebsiella oxytoca NOT DETECTED NOT DETECTED Final   Klebsiella pneumoniae NOT DETECTED NOT DETECTED Final   Proteus species NOT DETECTED NOT DETECTED Final   Salmonella species NOT DETECTED NOT DETECTED Final   Serratia marcescens NOT DETECTED NOT DETECTED Final   Haemophilus influenzae NOT DETECTED NOT DETECTED Final   Neisseria meningitidis NOT DETECTED NOT DETECTED Final   Pseudomonas aeruginosa NOT DETECTED NOT DETECTED Final   Stenotrophomonas maltophilia NOT DETECTED NOT DETECTED Final   Candida albicans NOT DETECTED NOT DETECTED Final   Candida auris NOT DETECTED NOT DETECTED Final   Candida glabrata NOT DETECTED NOT DETECTED Final   Candida krusei NOT DETECTED NOT DETECTED Final   Candida parapsilosis NOT DETECTED NOT DETECTED Final   Candida tropicalis NOT DETECTED NOT DETECTED Final   Cryptococcus neoformans/gattii NOT DETECTED NOT DETECTED Final   CTX-M ESBL NOT DETECTED NOT DETECTED Final   Carbapenem resistance IMP NOT DETECTED NOT DETECTED Final   Carbapenem resistance KPC NOT DETECTED NOT  DETECTED Final   Carbapenem resistance NDM NOT DETECTED NOT DETECTED Final   Carbapenem resist OXA 48 LIKE NOT DETECTED NOT DETECTED Final   Carbapenem resistance VIM NOT DETECTED NOT DETECTED Final    Comment: Performed at [facility], [address], [place], [place] [address]  Urine Culture     Status: Abnormal   Collection Time: 02/13/21 11:15 AM   Specimen: In/Out Cath Urine  Result Value Ref Range Status   Specimen Description IN/OUT CATH URINE  Final   Special Requests   Final    NONE Performed at [facility], [place] [address] [place], [place] [address]    Culture >=100,000 COLONIES/mL ESCHERICHIA COLI (A)  Final   Report Status 02/15/2021 FINAL  Final   Organism ID, Bacteria ESCHERICHIA COLI (A)  Final      Susceptibility   Escherichia coli - MIC*    AMPICILLIN <=2 SENSITIVE Sensitive     CEFAZOLIN <=4 SENSITIVE Sensitive     CEFEPIME <=0.12 SENSITIVE Sensitive     CEFTRIAXONE <=0.25 SENSITIVE Sensitive     CIPROFLOXACIN <=0.25 SENSITIVE Sensitive     GENTAMICIN <=1 SENSITIVE Sensitive     IMIPENEM <=0.25 SENSITIVE Sensitive     NITROFURANTOIN <=16 SENSITIVE Sensitive     TRIMETH/SULFA <=20 SENSITIVE Sensitive     AMPICILLIN/SULBACTAM <=2 SENSITIVE Sensitive     PIP/TAZO <=4 SENSITIVE Sensitive     * >=100,000 COLONIES/mL ESCHERICHIA COLI  MRSA Next Gen by PCR, Nasal     Status: None  Collection Time: 02/14/21  3:29 PM   Specimen: Nasal Mucosa; Nasal Swab  Result Value Ref Range Status   MRSA by PCR Next Gen NOT DETECTED NOT DETECTED Final    Comment: (NOTE) The GeneXpert MRSA Assay (FDA approved for NASAL specimens only), is one component of a comprehensive MRSA colonization surveillance program. It is not intended to diagnose MRSA infection nor to guide or monitor treatment for MRSA infections. Test performance is not FDA approved in patients less than 59 years old. Performed at [facility], [place] [address], [place], [place] [address]   C  Difficile Quick Screen w PCR reflex     Status: Abnormal   Collection Time: 02/14/21  9:04 PM   Specimen: STOOL  Result Value Ref Range Status   C Diff antigen POSITIVE (A) NEGATIVE Final   C Diff toxin NEGATIVE NEGATIVE Final   C Diff interpretation Results are indeterminate. See PCR results.  Final    Comment: Performed at [facility], [place] [address] [place], [place] [address]  C. Diff by PCR, Reflexed     Status: Abnormal   Collection Time: 02/14/21  9:04 PM  Result Value Ref Range Status   Toxigenic C. Difficile by PCR POSITIVE (A) NEGATIVE Final    Comment: Positive for toxigenic C. difficile with little to no toxin production. Only treat if clinical presentation suggests symptomatic illness. Performed at [facility], [place] [address] [place], [place] [address]      Radiology Studies: CT ABDOMEN PELVIS WO CONTRAST  Result Date: 02/14/2021 CLINICAL DATA:  Acute nonlocalized abdominal pain EXAM: CT ABDOMEN AND PELVIS WITHOUT CONTRAST TECHNIQUE: Multidetector CT imaging of the abdomen and pelvis was performed following the standard protocol without IV contrast. RADIATION DOSE REDUCTION: This exam was performed according to the departmental dose-optimization program which includes automated exposure control, adjustment of the mA and/or kV according to patient size and/or use of iterative reconstruction technique. COMPARISON:  07/21/2014 FINDINGS: Lower chest: Small right pleural effusion with basilar atelectasis or consolidation. Mild atelectasis in the left base. Small esophageal hiatal hernia. Esophageal wall is thickened suggesting reflux disease. Cardiac enlargement with coronary artery calcifications. Postoperative changes in the mediastinum. Hepatobiliary: No focal liver abnormality is seen. No gallstones, gallbladder wall thickening, or biliary dilatation. Pancreas: Unremarkable. No pancreatic ductal dilatation or surrounding inflammatory changes. Spleen: Normal in size  without focal abnormality. Adrenals/Urinary Tract: No adrenal gland nodules. Tiny stones in the left kidney measuring less than 2 mm diameter. No hydronephrosis or hydroureter. No ureteral stones or bladder stones. No bladder wall thickening. Stomach/Bowel: Stomach, small bowel, and colon are not abnormally distended. Scattered colonic diverticula without evidence of acute diverticulitis. Appendix is not identified. Vascular/Lymphatic: Aortic atherosclerosis. No enlarged abdominal or pelvic lymph nodes. Reproductive: Uterus and bilateral adnexa are unremarkable. Other: Small amount of free fluid in the pelvis and pericolic gutters with stranding in the pararenal retroperitoneal fat and in the mesentery. Changes may be inflammatory or edema. Consider possibility of pyelonephritis. Musculoskeletal: Degenerative changes in the spine. Postoperative fixation at L4-5. IMPRESSION: 1. Small right pleural effusion. Basilar atelectasis or consolidation in both lung bases, greater on the right. 2. Small esophageal hiatal hernia with esophageal wall thickening suggesting reflux disease. 3. Nonobstructing intrarenal stones in the left kidney. 4. Diffuse edema and stranding in the pararenal fat and along the pericolic gutters into the pelvis. Slight ascites. Consider edema or inflammatory process such is pyelonephritis. 5. No evidence of bowel obstruction or inflammation. 6. Aortic  atherosclerosis. Electronically Signed   By: Lucienne Capers M.D.   On: 02/14/2021 21:49   ECHOCARDIOGRAM COMPLETE  Result Date: 02/15/2021    ECHOCARDIOGRAM REPORT   Patient Name:   Anisah L Toomey Date of Exam: 02/15/2021 Medical Rec #:  [id number]     Height:       65.0 in Accession #:    [id number]    Weight:       168.2 lb Date of Birth:  May 30, 1940      BSA:          1.838 m Patient Age:    35 years      BP:           149/73 mmHg Patient Gender: F             HR:           69 bpm. Exam Location:  Inpatient Procedure: 2D Echo Indications:    CHF   History:        Patient has prior history of Echocardiogram examinations, most                 recent 04/16/2020. CAD; Risk Factors:Hypertension, Diabetes and                 Dyslipidemia.  Sonographer:    Arlyss Gandy Referring Phys: [place]  1. Left ventricular ejection fraction, by estimation, is 50 to 55%. The left ventricle has low normal function. The left ventricle has no regional wall motion abnormalities. There is mild concentric left ventricular hypertrophy. Left ventricular diastolic parameters are consistent with Grade III diastolic dysfunction (restrictive). Elevated left ventricular end-diastolic pressure.  2. Right ventricular systolic function is normal. The right ventricular size is mildly enlarged. There is severely elevated pulmonary artery systolic pressure. The estimated right ventricular systolic pressure is 00.9 mmHg.  3. Left atrial size was severely dilated.  4. Right atrial size was moderately dilated.  5. The mitral valve is grossly normal. Mild mitral valve regurgitation. No evidence of mitral stenosis. Moderate mitral annular calcification.  6. Tricuspid valve regurgitation is moderate.  7. The aortic valve is tricuspid. There is mild calcification of the aortic valve. There is mild thickening of the aortic valve. Aortic valve regurgitation is not visualized. Aortic valve sclerosis/calcification is present, without any evidence of aortic stenosis.  8. The inferior vena cava is normal in size with <50% respiratory variability, suggesting right atrial pressure of 8 mmHg. Comparison(s): No significant change from prior study. Prior images reviewed side by side. Conclusion(s)/Recommendation(s): Otherwise normal echocardiogram, with minor abnormalities described in the report. Severely elevated right sided pressures, restrictive LV filling (grade 3 diastolic dysfunction). FINDINGS  Left Ventricle: Left ventricular ejection fraction, by estimation, is 50 to 55%.  The left ventricle has low normal function. The left ventricle has no regional wall motion abnormalities. The left ventricular internal cavity size was normal in size. There is mild concentric left ventricular hypertrophy. Left ventricular diastolic parameters are consistent with Grade III diastolic dysfunction (restrictive). Elevated left ventricular end-diastolic pressure. Right Ventricle: The right ventricular size is mildly enlarged. Right vetricular wall thickness was not well visualized. Right ventricular systolic function is normal. There is severely elevated pulmonary artery systolic pressure. The tricuspid regurgitant velocity is 3.61 m/s, and with an assumed right atrial pressure of 8 mmHg, the estimated right ventricular systolic pressure is 23.3 mmHg. Left Atrium: Left atrial size was severely dilated. Right Atrium: Right atrial size was moderately dilated. Pericardium:  There is no evidence of pericardial effusion. Mitral Valve: The mitral valve is grossly normal. There is mild thickening of the mitral valve leaflet(s). There is mild calcification of the mitral valve leaflet(s). Moderate mitral annular calcification. Mild mitral valve regurgitation. No evidence of mitral valve stenosis. MV peak gradient, 10.9 mmHg. The mean mitral valve gradient is 3.0 mmHg. Tricuspid Valve: The tricuspid valve is normal in structure. Tricuspid valve regurgitation is moderate . No evidence of tricuspid stenosis. Aortic Valve: The aortic valve is tricuspid. There is mild calcification of the aortic valve. There is mild thickening of the aortic valve. Aortic valve regurgitation is not visualized. Aortic valve sclerosis/calcification is present, without any evidence of aortic stenosis. Aortic valve mean gradient measures 9.0 mmHg. Aortic valve peak gradient measures 15.7 mmHg. Aortic valve area, by VTI measures 2.22 cm. Pulmonic Valve: The pulmonic valve was not well visualized. Pulmonic valve regurgitation is mild. No  evidence of pulmonic stenosis. Aorta: The aortic root, ascending aorta and aortic arch are all structurally normal, with no evidence of dilitation or obstruction. Venous: The inferior vena cava is normal in size with less than 50% respiratory variability, suggesting right atrial pressure of 8 mmHg. IAS/Shunts: The atrial septum is grossly normal.  LEFT VENTRICLE PLAX 2D LVIDd:         4.40 cm   Diastology LVIDs:         3.10 cm   LV e' medial:    7.72 cm/s LV PW:         1.10 cm   LV E/e' medial:  17.6 LV IVS:        1.10 cm   LV e' lateral:   10.80 cm/s LVOT diam:     2.00 cm   LV E/e' lateral: 12.6 LV SV:         95 LV SV Index:   51 LVOT Area:     3.14 cm  RIGHT VENTRICLE            IVC RV Basal diam:  4.20 cm    IVC diam: 2.00 cm RV Mid diam:    3.20 cm RV S prime:     8.16 cm/s TAPSE (M-mode): 1.4 cm LEFT ATRIUM              Index        RIGHT ATRIUM           Index LA diam:        4.10 cm  2.23 cm/m   RA Area:     22.60 cm LA Vol (A2C):   108.0 ml 58.76 ml/m  RA Volume:   66.90 ml  36.40 ml/m LA Vol (A4C):   77.4 ml  42.11 ml/m LA Biplane Vol: 91.6 ml  49.84 ml/m  AORTIC VALVE AV Area (Vmax):    2.43 cm AV Area (Vmean):   2.23 cm AV Area (VTI):     2.22 cm AV Vmax:           198.00 cm/s AV Vmean:          141.000 cm/s AV VTI:            0.425 m AV Peak Grad:      15.7 mmHg AV Mean Grad:      9.0 mmHg LVOT Vmax:         153.00 cm/s LVOT Vmean:        100.000 cm/s LVOT VTI:          0.301 m  LVOT/AV VTI ratio: 0.71  AORTA Ao Root diam: 3.20 cm Ao Asc diam:  3.40 cm MITRAL VALVE                TRICUSPID VALVE MV Area (PHT): 3.60 cm     TR Peak grad:   52.1 mmHg MV Area VTI:   2.46 cm     TR Vmax:        361.00 cm/s MV Peak grad:  10.9 mmHg MV Mean grad:  3.0 mmHg     SHUNTS MV Vmax:       1.65 m/s     Systemic VTI:  0.30 m MV Vmean:      70.7 cm/s    Systemic Diam: 2.00 cm MV Decel Time: 211 msec MV E velocity: 136.00 cm/s MV A velocity: 44.00 cm/s MV E/A ratio:  3.09 Buford Dresser MD  Electronically signed by Buford Dresser MD Signature Date/Time: 02/15/2021/1:03:58 PM    Final     Scheduled Meds:  carvedilol  12.5 mg Oral BID   cefadroxil  1,000 mg Oral BID   mouth rinse  15 mL Mouth Rinse BID   vancomycin  125 mg Oral QID   Continuous Infusions:     LOS: 3 days   Shelly Coss, MD [facility] P2/10/2021, 11:03 AM

## 2021-02-16 NOTE — Progress Notes (Signed)
Occupational Therapy Treatment Patient Details Name: Andrea Kirk MRN: [id number] DOB: 1940-03-25 Today's Date: 02/16/2021   History of present illness Andrea Kirk is a 81 y.o. female admitted 02/13/21 from SNF ([facility]) due to altered mental status. CT negative for acute abnormality. Pt has been having watery frequent stools since arrival to ED. PMH includes CVA, carotid artery disease with CABG, diastolic CHF, J6EG, HTN, HLD, iron deficiency anemia, depression CKD stage 2   OT comments  Patient received in supine and agreeable to OT session. Patient assisted with bed mobility by moving BLE off to EOB and required assistance with trunk. Patient performed light grooming seated on EOB with assistance for balance. Patient performed 2 stands from EOB with patient taking side steps towards [facility] on second attempt. Patient stated she was feeling nauseous and asked to return to supine. Patient is making gains with OT treatment and could benefit from further OT services. Acute OT to continue to follow.    Recommendations for follow up therapy are one component of a multi-disciplinary discharge planning process, led by the attending physician.  Recommendations may be updated based on patient status, additional functional criteria and insurance authorization.    Follow Up Recommendations  Skilled nursing-short term rehab (<3 hours/day)    Assistance Recommended at Discharge Frequent or constant Supervision/Assistance  Patient can return home with the following  A lot of help with walking and/or transfers;A lot of help with bathing/dressing/bathroom   Equipment Recommendations  None recommended by OT    Recommendations for Other Services      Precautions / Restrictions Precautions Precautions: Fall Precaution Comments: watch for Cdiff results       Mobility Bed Mobility Overal bed mobility: Needs Assistance Bed Mobility: Supine to Sit, Sit to Supine     Supine to sit: Max assist Sit to  supine: Max assist   General bed mobility comments: assisted with moviing legs off side of bed and required assistance to complete    Transfers Overall transfer level: Needs assistance Equipment used: 1 person hand held assist Transfers: Sit to/from Stand Sit to Stand: Mod assist           General transfer comment: performed sit to stands from EOB and side steped towards [facility] with mod assist     Balance Overall balance assessment: Needs assistance Sitting-balance support: Single extremity supported, Feet supported Sitting balance-Leahy Scale: Poor Sitting balance - Comments: min assist for sitting ballance   Standing balance support: Single extremity supported, During functional activity Standing balance-Leahy Scale: Poor Standing balance comment: Stood from EOB with hand held assist.                           ADL either performed or assessed with clinical judgement   ADL Overall ADL's : Needs assistance/impaired     Grooming: Wash/dry hands;Wash/dry face;Sitting;Minimal assistance;Brushing hair Grooming Details (indicate cue type and reason): washing face and hands performed seated on EOB, performed combing hair in supine due to feeling nauseous                               General ADL Comments: performed grooming tasks seated on EOB before feeling nauseous    Extremity/Trunk Assessment              Vision       Perception     Praxis      Cognition Arousal/Alertness:  Awake/alert Behavior During Therapy: Restless Overall Cognitive Status: Impaired/Different from baseline Area of Impairment: Orientation, Following commands, Safety/judgement, Awareness, Problem solving                 Orientation Level: Disoriented to, Place, Time, Situation     Following Commands: Follows one step commands inconsistently Safety/Judgement: Decreased awareness of safety, Decreased awareness of deficits Awareness: Emergent Problem Solving:  Decreased initiation, Requires verbal cues, Requires tactile cues General Comments: perseverated on needing a comb        Exercises      Shoulder Instructions       General Comments      Pertinent Vitals/ Pain       Pain Assessment Pain Assessment: Faces Faces Pain Scale: Hurts little more Pain Location: stomach/abdomen Pain Descriptors / Indicators: Discomfort Pain Intervention(s): Monitored during session  Home Living                                          Prior Functioning/Environment              Frequency  Min 2X/week        Progress Toward Goals  OT Goals(current goals can now be found in the care plan section)  Progress towards OT goals: Progressing toward goals  Acute Rehab OT Goals Patient Stated Goal: go back to SNF OT Goal Formulation: With patient Time For Goal Achievement: 02/28/21 Potential to Achieve Goals: Fair ADL Goals Pt Will Perform Grooming: with set-up;sitting Pt Will Perform Upper Body Dressing: with set-up;sitting Pt Will Perform Lower Body Dressing: with min guard assist;sit to/from stand Pt Will Transfer to Toilet: with min assist;stand pivot transfer Pt Will Perform Toileting - Clothing Manipulation and hygiene: with min assist;sit to/from stand  Plan Discharge plan remains appropriate    Co-evaluation                 AM-PAC OT "6 Clicks" Daily Activity     Outcome Measure   Help from another person eating meals?: A Lot Help from another person taking care of personal grooming?: A Lot Help from another person toileting, which includes using toliet, bedpan, or urinal?: Total Help from another person bathing (including washing, rinsing, drying)?: Total Help from another person to put on and taking off regular upper body clothing?: Total Help from another person to put on and taking off regular lower body clothing?: Total 6 Click Score: 8    End of Session Equipment Utilized During Treatment:  Oxygen  OT Visit Diagnosis: Unsteadiness on feet (R26.81);Other abnormalities of gait and mobility (R26.89);Muscle weakness (generalized) (M62.81);Other symptoms and signs involving cognitive function   Activity Tolerance Patient tolerated treatment well;Other (comment) (felt nauseous after sitting on EOB)   Patient Left in bed;with call bell/phone within reach;with bed alarm set   Nurse Communication Mobility status;Other (comment) (request for nausea medication)        Time: 1421-1450 OT Time Calculation (min): 29 min  Charges: OT General Charges $OT Visit: 1 Visit OT Treatments $Self Care/Home Management : 8-22 mins $Therapeutic Activity: 8-22 mins  Lodema Hong, OTA Acute Rehabilitation Services  Pager [phone] Office [place] 02/16/2021, 3:11 PM

## 2021-02-17 DIAGNOSIS — R110 Nausea: Secondary | ICD-10-CM

## 2021-02-17 LAB — CBC WITH DIFFERENTIAL/PLATELET
Abs Immature Granulocytes: 0.03 10*3/uL (ref 0.00–0.07)
Basophils Absolute: 0 10*3/uL (ref 0.0–0.1)
Basophils Relative: 0 %
Eosinophils Absolute: 0.1 10*3/uL (ref 0.0–0.5)
Eosinophils Relative: 1 %
HCT: 34.8 % — ABNORMAL LOW (ref 36.0–46.0)
Hemoglobin: 11.1 g/dL — ABNORMAL LOW (ref 12.0–15.0)
Immature Granulocytes: 0 %
Lymphocytes Relative: 12 %
Lymphs Abs: 0.9 10*3/uL (ref 0.7–4.0)
MCH: 33.7 pg (ref 26.0–34.0)
MCHC: 31.9 g/dL (ref 30.0–36.0)
MCV: 105.8 fL — ABNORMAL HIGH (ref 80.0–100.0)
Monocytes Absolute: 0.8 10*3/uL (ref 0.1–1.0)
Monocytes Relative: 11 %
Neutro Abs: 5.5 10*3/uL (ref 1.7–7.7)
Neutrophils Relative %: 76 %
Platelets: 67 10*3/uL — ABNORMAL LOW (ref 150–400)
RBC: 3.29 MIL/uL — ABNORMAL LOW (ref 3.87–5.11)
RDW: 14.6 % (ref 11.5–15.5)
Smear Review: DECREASED
WBC: 7.3 10*3/uL (ref 4.0–10.5)
nRBC: 0 % (ref 0.0–0.2)

## 2021-02-17 LAB — BASIC METABOLIC PANEL
Anion gap: 7 (ref 5–15)
BUN: 31 mg/dL — ABNORMAL HIGH (ref 8–23)
CO2: 29 mmol/L (ref 22–32)
Calcium: 9.2 mg/dL (ref 8.9–10.3)
Chloride: 103 mmol/L (ref 98–111)
Creatinine, Ser: 0.85 mg/dL (ref 0.44–1.00)
GFR, Estimated: >60 mL/min (ref >60)
Glucose, Bld: 164 mg/dL — ABNORMAL HIGH (ref 70–99)
Potassium: 3.8 mmol/L (ref 3.5–5.1)
Sodium: 139 mmol/L (ref 135–145)

## 2021-02-17 LAB — CULTURE, BLOOD (ROUTINE X 2): Special Requests: ADEQUATE

## 2021-02-17 MED ORDER — WHITE PETROLATUM EX OINT
TOPICAL_OINTMENT | CUTANEOUS | Status: DC | PRN
  Filled 2021-02-17: qty 28.35

## 2021-02-17 MED ORDER — BUSPIRONE HCL 15 MG PO TABS
7.5000 mg | ORAL_TABLET | Freq: Two times a day (BID) | ORAL | Status: DC
  Administered 2021-02-17 – 2021-02-20 (×7): 7.5 mg via ORAL
  Filled 2021-02-17 (×8): qty 1

## 2021-02-17 MED ORDER — GABAPENTIN 100 MG PO CAPS
100.0000 mg | ORAL_CAPSULE | Freq: Three times a day (TID) | ORAL | Status: DC
  Administered 2021-02-17 – 2021-02-20 (×10): 100 mg via ORAL
  Filled 2021-02-17 (×10): qty 1

## 2021-02-17 MED ORDER — SODIUM CHLORIDE 0.9 % IV SOLN
12.5000 mg | Freq: Four times a day (QID) | INTRAVENOUS | Status: DC | PRN
  Administered 2021-02-17 – 2021-02-18 (×2): 12.5 mg via INTRAVENOUS
  Filled 2021-02-17 (×3): qty 0.5

## 2021-02-17 MED ORDER — METOCLOPRAMIDE HCL 5 MG/ML IJ SOLN
10.0000 mg | Freq: Three times a day (TID) | INTRAMUSCULAR | Status: AC
  Administered 2021-02-17 – 2021-02-18 (×4): 10 mg via INTRAVENOUS
  Filled 2021-02-17 (×4): qty 2

## 2021-02-17 NOTE — Progress Notes (Signed)
PROGRESS NOTE  Andrea Kirk  RUE:[id number] DOB: 1940/11/16 DOA: 02/13/2021 PCP: Aura Dials, MD   Brief Narrative: Patient is a 81 year old female with history of CVA, coronary artery disease status post CABG, diastolic CHF, diabetes type 2, hypertension, hyperlipidemia, iron deficiency anemia, depression, CKD stage II who presented from skilled nursing facility due to altered mental status.  On presentation she was febrile, hypertensive, hypoxic on room air.  Lab work showed creatinine of 2, UA showed elevated leukocytes.  Chest x-ray showed mild bibasilar atelectasis or pneumonia, vascular congestion, small right pleural effusion.  Code sepsis was initiated in the emergency department and was given IV fluids.  Lab work also showed elevated troponin, cardiology consulted.  Blood cultures showed E. coli with source likely the urine.  She was having frequent diarrhea, C. difficile came out to positive.  Started on vancomycin.  Overall status including mental status have significantly improved.  Plan is to discharge back to skilled nursing facility when appropriate   Assessment & Plan:  * Acute encephalopathy Mental status has improved -Metabolic encephalopathy secondary to sepsis -Head CT did not show any acute intracranial abnormalities. --Currently she is alert and oriented  Gram-negative bacteremia Urine culture and blood cultures showing gram-negative rods: E. coli.  She was on ceftriaxone 2 g daily.Cx showed  pansensitive E. Coli.  Antibiotics changed to cefadroxil for total of 7 days course  Severe sepsis ([place]) Initially febrile and hypotensive. Currently sepsis resolved improved.  Continue current antibiotics.   Elevated troponin Elevated troponin.  Jumped from 394 to 637.  No report of chest pain.  Could be from supply demand ischemia from sepsis. cardiology was consulted, no plan for any further work-up.  Echocardiogram as documented  Acute renal failure superimposed on stage 2  chronic kidney disease ([place])- (present on admission) AKI has resolved  Essential hypertension- (present on admission) Restarted home Coreg Continue as needed medications for severe hypertension.  Currently she is normotensive   Acute respiratory failure with hypoxia ([place])- (present on admission) As on her hospital discharge in 04/2020 after a stroke she was discharged on home oxygen 2L  Currently on 2 L of oxygen per minute.  We will try to wean the oxygen  Chronic diastolic CHF (congestive heart failure) ([place])- (present on admission) -She was initially started on IV fluids now stopped.  Elevated BNP.  Chest x-ray showed vascular congestion -Echo done during this hospitalization showed EF of 50-55% with grade 3 diastolic dysfunction, Severely elevated pulmonary artery pressure. -intake/output -Given IV Lasix 1 dose, now  held -Cardiology was consulted   Thrombocytopenia ([place])- (present on admission) Unclear etiology but most likely secondary to bacteremia/sepsis.  We will continue to monitor.  No medications identified for the cause of potential thrombocytopenic effect  Iron deficiency anemia- (present on admission) There was report of passage of dark stool.  FOBT was positive.  On checking her previous lab works, her hemoglobin is in the range of 10.  Currently hemoglobin at baseline.  No further work-up planned as patient has C. difficile   HLD (hyperlipidemia)- (present on admission) Not on statin, no allergy unsure why   Controlled type 2 diabetes mellitus with hyperglycemia ([place])- (present on admission) No recent labs Takes  Glipizide, currently on hold SSI and routine accuchecks   hx of CVA (cerebral vascular accident) ([place])- (present on admission) Head CT with no acute finding today  Takes ASA/brillinta at home  Carotid artery disease ([place])- (present on admission) On aspirin and Brilinta at home, now on  hold due to positive FOBT.  Her hemoglobin is at baseline.  We will  resume this medications on discharge  Nausea She complains of severe nausea.  Currently on Zofran, Phenergan.  She denies any abdominal pain or vomiting.  Her abdomen is benign.  Continue gentle IV fluids.  Currently on full liquid diet.  C. difficile colitis Patient was having several loose bowel movements.  Also complaining of abdominal pain.  Abdominal/pelvis CT did not show any bowel inflammation.  C. difficile antigen came out to be positive without toxin.  Since patient was symptomatic with the diarrhea and abdominal pain, we started treatment . FOBT was found to be positive.  Hemoglobin is stable, so will not pursue any further work-up  Anxiety and depression On buspar/celexa We will resume on DC            DVT prophylaxis:Place and maintain sequential compression device Start: 02/14/21 2105 SCDs Start: 02/13/21 1451     Code Status: Full Code  Family Communication: Called and discussed with brother on phone on 2/11  Patient status:Inpatient  Patient is from: Skilled nursing facility  Anticipated discharge to:SNF  Estimated discharge date: in 2-3 days    Consultants: Cardiology  Procedures: None  Antimicrobials:  Anti-infectives (From admission, onward)    Start     Dose/Rate Route Frequency Ordered Stop   02/16/21 1000  cefadroxil (DURICEF) capsule 1,000 mg        1,000 mg Oral 2 times daily 02/16/21 0741 02/19/21 2359   02/15/21 1000  vancomycin (VANCOCIN) capsule 125 mg        125 mg Oral 4 times daily 02/15/21 0800 02/27/21 0959   02/14/21 1300  ceFEPIme (MAXIPIME) 2 g in sodium chloride 0.9 % 100 mL IVPB  Status:  Discontinued        2 g 200 mL/hr over 30 Minutes Intravenous Every 24 hours 02/13/21 1225 02/14/21 0423   02/14/21 1200  cefTRIAXone (ROCEPHIN) 2 g in sodium chloride 0.9 % 100 mL IVPB  Status:  Discontinued        2 g 200 mL/hr over 30 Minutes Intravenous Every 24 hours 02/14/21 0423 02/16/21 0741   02/13/21 1224  vancomycin variable  dose per unstable renal function (pharmacist dosing)  Status:  Discontinued         Does not apply See admin instructions 02/13/21 1225 02/14/21 0423   02/13/21 1130  ceFEPIme (MAXIPIME) 2 g in sodium chloride 0.9 % 100 mL IVPB        2 g 200 mL/hr over 30 Minutes Intravenous  Once 02/13/21 1123 02/13/21 1304   02/13/21 1130  vancomycin (VANCOREADY) IVPB 1500 mg/300 mL        1,500 mg 150 mL/hr over 120 Minutes Intravenous  Once 02/13/21 1123 02/13/21 1515   02/13/21 1115  metroNIDAZOLE (FLAGYL) IVPB 500 mg        500 mg 100 mL/hr over 60 Minutes Intravenous  Once 02/13/21 1101 02/13/21 1230       Subjective:  patient seen and examined at the bedside this morning.  Hemodynamically stable.  Complains of nausea which has been the most important problem for her.  She denies frequent loose stools or abdominal pain.  She has not vomited.  Objective: Vitals:   02/16/21 0751 02/16/21 1959 02/17/21 0319 02/17/21 0902  BP: 125/69 139/66 (!) 169/81 (!) 186/81  Pulse: (!) 58 60 (!) 59 66  Resp: 16 20 19 20   Temp: 97.9 F (36.6 C) 97.9 F (36.6 C)  98.1 F (36.7 C) 98.9 F (37.2 C)  TempSrc: Oral Oral Oral Oral  SpO2: 98% 98% 100% 100%  Weight:      Height:        Intake/Output Summary (Last 24 hours) at 02/17/2021 1022 Last data filed at 02/17/2021 0500 Gross per 24 hour  Intake 1141.97 ml  Output 400 ml  Net 741.97 ml   Filed Weights   02/13/21 1044 02/15/21 0500  Weight: 74.8 kg 76.3 kg    Examination:   General exam: Deconditioned, chronically ill looking HEENT: PERRL Respiratory system:  no wheezes or crackles  Cardiovascular system: S1 & S2 heard, RRR.  Gastrointestinal system: Abdomen is nondistended, soft and nontender. Central nervous system: Alert and oriented Extremities: No edema, no clubbing ,no cyanosis Skin: No rashes, no ulcers,no icterus    Data Reviewed: I have personally reviewed following labs and imaging studies  CBC: Recent Labs  Lab  02/13/21 1103 02/13/21 1536 02/14/21 0116 02/15/21 0200 02/16/21 0048 02/17/21 0107  WBC 10.8*  --  11.0* 6.7 6.0 7.3  NEUTROABS 8.6*  --   --  5.9 4.4 5.5  HGB 11.3* 11.9* 13.5 11.3* 10.1* 11.1*  HCT 35.9* 35.0* 41.2 34.1* 32.0* 34.8*  MCV 107.8*  --  106.7* 103.6* 105.6* 105.8*  PLT 91*  --  86* 64* 59* 67*   Basic Metabolic Panel: Recent Labs  Lab 02/13/21 1103 02/13/21 1510 02/13/21 1536 02/14/21 0116 02/15/21 0200 02/16/21 0048 02/17/21 0107  NA 137  --  138 135 139 139 139  K 4.1  --  4.4 4.1 3.6 3.7 3.8  CL 105  --   --  102 103 103 103  CO2 23  --   --  20* 26 28 29   GLUCOSE 140*  --   --  181* 176* 144* 164*  BUN 50*  --   --  52* 40* 36* 31*  CREATININE 2.07*  --   --  1.67* 1.06* 0.92 0.85  CALCIUM 8.6*  --   --  9.2 8.9 9.1 9.2  MG  --  2.0  --   --   --   --   --      Recent Results (from the past 240 hour(s))  Blood Culture (routine x 2)     Status: Abnormal   Collection Time: 02/13/21 10:53 AM   Specimen: BLOOD  Result Value Ref Range Status   Specimen Description BLOOD RIGHT ANTECUBITAL  Final   Special Requests   Final    BOTTLES DRAWN AEROBIC AND ANAEROBIC Blood Culture adequate volume   Culture  Setup Time   Final    GRAM NEGATIVE RODS IN BOTH AEROBIC AND ANAEROBIC BOTTLES CRITICAL VALUE NOTED.  VALUE IS CONSISTENT WITH PREVIOUSLY REPORTED AND CALLED VALUE.    Culture (A)  Final    ESCHERICHIA COLI SUSCEPTIBILITIES PERFORMED ON PREVIOUS CULTURE WITHIN THE LAST 5 DAYS. Performed at [facility], [place] [address], [place], [place] [address]    Report Status 02/17/2021 FINAL  Final  Resp Panel by RT-PCR (Flu A&B, Covid) Nasopharyngeal Swab     Status: None   Collection Time: 02/13/21 11:00 AM   Specimen: Nasopharyngeal Swab; Nasopharyngeal(NP) swabs in vial transport medium  Result Value Ref Range Status   SARS Coronavirus 2 by RT PCR NEGATIVE NEGATIVE Final    Comment: (NOTE) SARS-CoV-2 target nucleic acids are NOT DETECTED.  The  SARS-CoV-2 RNA is generally detectable in upper respiratory specimens during the acute phase of infection.  The lowest concentration of SARS-CoV-2 viral copies this assay can detect is 138 copies/mL. A negative result does not preclude SARS-Cov-2 infection and should not be used as the sole basis for treatment or other patient management decisions. A negative result may occur with  improper specimen collection/handling, submission of specimen other than nasopharyngeal swab, presence of viral mutation(s) within the areas targeted by this assay, and inadequate number of viral copies(<138 copies/mL). A negative result must be combined with clinical observations, patient history, and epidemiological information. The expected result is Negative.  Fact Sheet for Patients:  EntrepreneurPulse.com.au  Fact Sheet for Healthcare Providers:  IncredibleEmployment.be  This test is no t yet approved or cleared by the [place] FDA and  has been authorized for detection and/or diagnosis of SARS-CoV-2 by FDA under an Emergency Use Authorization (EUA). This EUA will remain  in effect (meaning this test can be used) for the duration of the COVID-19 declaration under Section 564(b)(1) of the Act, 21 U.S.C.section 360bbb-3(b)(1), unless the authorization is terminated  or revoked sooner.       Influenza A by PCR NEGATIVE NEGATIVE Final   Influenza B by PCR NEGATIVE NEGATIVE Final    Comment: (NOTE) The Xpert Xpress SARS-CoV-2/FLU/RSV plus assay is intended as an aid in the diagnosis of influenza from Nasopharyngeal swab specimens and should not be used as a sole basis for treatment. Nasal washings and aspirates are unacceptable for Xpert Xpress SARS-CoV-2/FLU/RSV testing.  Fact Sheet for Patients: EntrepreneurPulse.com.au  Fact Sheet for Healthcare Providers: IncredibleEmployment.be  This test is not yet approved or  cleared by the [place] FDA and has been authorized for detection and/or diagnosis of SARS-CoV-2 by FDA under an Emergency Use Authorization (EUA). This EUA will remain in effect (meaning this test can be used) for the duration of the COVID-19 declaration under Section 564(b)(1) of the Act, 21 U.S.C. section 360bbb-3(b)(1), unless the authorization is terminated or revoked.  Performed at [facility], [place] [address], [place], [place] [address]   Blood Culture (routine x 2)     Status: Abnormal   Collection Time: 02/13/21 11:00 AM   Specimen: BLOOD  Result Value Ref Range Status   Specimen Description BLOOD SITE NOT SPECIFIED  Final   Special Requests   Final    BOTTLES DRAWN AEROBIC AND ANAEROBIC Blood Culture results may not be optimal due to an inadequate volume of blood received in culture bottles   Culture  Setup Time   Final    GRAM NEGATIVE RODS IN BOTH AEROBIC AND ANAEROBIC BOTTLES CRITICAL RESULT CALLED TO, READ BACK BY AND VERIFIED WITH: V BRYK,PHARMD@[id number]  02/14/21 [place] Performed at [facility], [address], [place], [place] [address]    Culture ESCHERICHIA COLI (A)  Final   Report Status 02/16/2021 FINAL  Final   Organism ID, Bacteria ESCHERICHIA COLI  Final      Susceptibility   Escherichia coli - MIC*    AMPICILLIN <=2 SENSITIVE Sensitive     CEFAZOLIN <=4 SENSITIVE Sensitive     CEFEPIME <=0.12 SENSITIVE Sensitive     CEFTAZIDIME <=1 SENSITIVE Sensitive     CEFTRIAXONE <=0.25 SENSITIVE Sensitive     CIPROFLOXACIN <=0.25 SENSITIVE Sensitive     GENTAMICIN <=1 SENSITIVE Sensitive     IMIPENEM <=0.25 SENSITIVE Sensitive     TRIMETH/SULFA <=20 SENSITIVE Sensitive     AMPICILLIN/SULBACTAM <=2 SENSITIVE Sensitive     PIP/TAZO <=4 SENSITIVE Sensitive     * ESCHERICHIA COLI  Blood Culture  ID Panel (Reflexed)     Status: Abnormal   Collection Time: 02/13/21 11:00 AM  Result Value Ref Range Status   Enterococcus faecalis NOT DETECTED NOT DETECTED  Final   Enterococcus Faecium NOT DETECTED NOT DETECTED Final   Listeria monocytogenes NOT DETECTED NOT DETECTED Final   Staphylococcus species NOT DETECTED NOT DETECTED Final   Staphylococcus aureus (BCID) NOT DETECTED NOT DETECTED Final   Staphylococcus epidermidis NOT DETECTED NOT DETECTED Final   Staphylococcus lugdunensis NOT DETECTED NOT DETECTED Final   Streptococcus species NOT DETECTED NOT DETECTED Final   Streptococcus agalactiae NOT DETECTED NOT DETECTED Final   Streptococcus pneumoniae NOT DETECTED NOT DETECTED Final   Streptococcus pyogenes NOT DETECTED NOT DETECTED Final   A.calcoaceticus-baumannii NOT DETECTED NOT DETECTED Final   Bacteroides fragilis NOT DETECTED NOT DETECTED Final   Enterobacterales DETECTED (A) NOT DETECTED Final    Comment: Enterobacterales represent a large order of gram negative bacteria, not a single organism. CRITICAL RESULT CALLED TO, READ BACK BY AND VERIFIED WITH: V BRYK,PHARMD@[id number]  02/14/21 [place]    Enterobacter cloacae complex NOT DETECTED NOT DETECTED Final   Escherichia coli DETECTED (A) NOT DETECTED Final    Comment: CRITICAL RESULT CALLED TO, READ BACK BY AND VERIFIED WITH: V BRYK,PHARMD@[id number]  02/14/21 [place]    Klebsiella aerogenes NOT DETECTED NOT DETECTED Final   Klebsiella oxytoca NOT DETECTED NOT DETECTED Final   Klebsiella pneumoniae NOT DETECTED NOT DETECTED Final   Proteus species NOT DETECTED NOT DETECTED Final   Salmonella species NOT DETECTED NOT DETECTED Final   Serratia marcescens NOT DETECTED NOT DETECTED Final   Haemophilus influenzae NOT DETECTED NOT DETECTED Final   Neisseria meningitidis NOT DETECTED NOT DETECTED Final   Pseudomonas aeruginosa NOT DETECTED NOT DETECTED Final   Stenotrophomonas maltophilia NOT DETECTED NOT DETECTED Final   Candida albicans NOT DETECTED NOT DETECTED Final   Candida auris NOT DETECTED NOT DETECTED Final   Candida glabrata NOT DETECTED NOT DETECTED Final   Candida krusei NOT DETECTED NOT  DETECTED Final   Candida parapsilosis NOT DETECTED NOT DETECTED Final   Candida tropicalis NOT DETECTED NOT DETECTED Final   Cryptococcus neoformans/gattii NOT DETECTED NOT DETECTED Final   CTX-M ESBL NOT DETECTED NOT DETECTED Final   Carbapenem resistance IMP NOT DETECTED NOT DETECTED Final   Carbapenem resistance KPC NOT DETECTED NOT DETECTED Final   Carbapenem resistance NDM NOT DETECTED NOT DETECTED Final   Carbapenem resist OXA 48 LIKE NOT DETECTED NOT DETECTED Final   Carbapenem resistance VIM NOT DETECTED NOT DETECTED Final    Comment: Performed at [facility], [address], [place], [place] [address]  Urine Culture     Status: Abnormal   Collection Time: 02/13/21 11:15 AM   Specimen: In/Out Cath Urine  Result Value Ref Range Status   Specimen Description IN/OUT CATH URINE  Final   Special Requests   Final    NONE Performed at [facility], [place] [address] [place], [place] [address]    Culture >=100,000 COLONIES/mL ESCHERICHIA COLI (A)  Final   Report Status 02/15/2021 FINAL  Final   Organism ID, Bacteria ESCHERICHIA COLI (A)  Final      Susceptibility   Escherichia coli - MIC*    AMPICILLIN <=2 SENSITIVE Sensitive     CEFAZOLIN <=4 SENSITIVE Sensitive     CEFEPIME <=0.12 SENSITIVE Sensitive     CEFTRIAXONE <=0.25 SENSITIVE Sensitive     CIPROFLOXACIN <=0.25 SENSITIVE Sensitive     GENTAMICIN <=1 SENSITIVE Sensitive  IMIPENEM <=0.25 SENSITIVE Sensitive     NITROFURANTOIN <=16 SENSITIVE Sensitive     TRIMETH/SULFA <=20 SENSITIVE Sensitive     AMPICILLIN/SULBACTAM <=2 SENSITIVE Sensitive     PIP/TAZO <=4 SENSITIVE Sensitive     * >=100,000 COLONIES/mL ESCHERICHIA COLI  MRSA Next Gen by PCR, Nasal     Status: None   Collection Time: 02/14/21  3:29 PM   Specimen: Nasal Mucosa; Nasal Swab  Result Value Ref Range Status   MRSA by PCR Next Gen NOT DETECTED NOT DETECTED Final    Comment: (NOTE) The GeneXpert MRSA Assay (FDA approved for NASAL specimens  only), is one component of a comprehensive MRSA colonization surveillance program. It is not intended to diagnose MRSA infection nor to guide or monitor treatment for MRSA infections. Test performance is not FDA approved in patients less than 26 years old. Performed at [facility], [place] [address], [place], [place] [address]   C Difficile Quick Screen w PCR reflex     Status: Abnormal   Collection Time: 02/14/21  9:04 PM   Specimen: STOOL  Result Value Ref Range Status   C Diff antigen POSITIVE (A) NEGATIVE Final   C Diff toxin NEGATIVE NEGATIVE Final   C Diff interpretation Results are indeterminate. See PCR results.  Final    Comment: Performed at [facility], [place] [address] [place], [place] [address]  C. Diff by PCR, Reflexed     Status: Abnormal   Collection Time: 02/14/21  9:04 PM  Result Value Ref Range Status   Toxigenic C. Difficile by PCR POSITIVE (A) NEGATIVE Final    Comment: Positive for toxigenic C. difficile with little to no toxin production. Only treat if clinical presentation suggests symptomatic illness. Performed at [facility], [place] [address] [place], [place] [address]      Radiology Studies: ECHOCARDIOGRAM COMPLETE  Result Date: 02/15/2021    ECHOCARDIOGRAM REPORT   Patient Name:   Laterrica L Chandler Date of Exam: 02/15/2021 Medical Rec #:  [id number]     Height:       65.0 in Accession #:    [id number]    Weight:       168.2 lb Date of Birth:  [birth year]      BSA:          1.838 m Patient Age:    68 years      BP:           149/73 mmHg Patient Gender: F             HR:           69 bpm. Exam Location:  Inpatient Procedure: 2D Echo Indications:    CHF  History:        Patient has prior history of Echocardiogram examinations, most                 recent 04/16/2020. CAD; Risk Factors:Hypertension, Diabetes and                 Dyslipidemia.  Sonographer:    Arlyss Gandy Referring Phys: [place]  1. Left ventricular ejection  fraction, by estimation, is 50 to 55%. The left ventricle has low normal function. The left ventricle has no regional wall motion abnormalities. There is mild concentric left ventricular hypertrophy. Left ventricular diastolic parameters are consistent with Grade III diastolic dysfunction (restrictive). Elevated left ventricular end-diastolic pressure.  2. Right ventricular systolic function is normal. The  right ventricular size is mildly enlarged. There is severely elevated pulmonary artery systolic pressure. The estimated right ventricular systolic pressure is 77.8 mmHg.  3. Left atrial size was severely dilated.  4. Right atrial size was moderately dilated.  5. The mitral valve is grossly normal. Mild mitral valve regurgitation. No evidence of mitral stenosis. Moderate mitral annular calcification.  6. Tricuspid valve regurgitation is moderate.  7. The aortic valve is tricuspid. There is mild calcification of the aortic valve. There is mild thickening of the aortic valve. Aortic valve regurgitation is not visualized. Aortic valve sclerosis/calcification is present, without any evidence of aortic stenosis.  8. The inferior vena cava is normal in size with <50% respiratory variability, suggesting right atrial pressure of 8 mmHg. Comparison(s): No significant change from prior study. Prior images reviewed side by side. Conclusion(s)/Recommendation(s): Otherwise normal echocardiogram, with minor abnormalities described in the report. Severely elevated right sided pressures, restrictive LV filling (grade 3 diastolic dysfunction). FINDINGS  Left Ventricle: Left ventricular ejection fraction, by estimation, is 50 to 55%. The left ventricle has low normal function. The left ventricle has no regional wall motion abnormalities. The left ventricular internal cavity size was normal in size. There is mild concentric left ventricular hypertrophy. Left ventricular diastolic parameters are consistent with Grade III diastolic  dysfunction (restrictive). Elevated left ventricular end-diastolic pressure. Right Ventricle: The right ventricular size is mildly enlarged. Right vetricular wall thickness was not well visualized. Right ventricular systolic function is normal. There is severely elevated pulmonary artery systolic pressure. The tricuspid regurgitant velocity is 3.61 m/s, and with an assumed right atrial pressure of 8 mmHg, the estimated right ventricular systolic pressure is 24.2 mmHg. Left Atrium: Left atrial size was severely dilated. Right Atrium: Right atrial size was moderately dilated. Pericardium: There is no evidence of pericardial effusion. Mitral Valve: The mitral valve is grossly normal. There is mild thickening of the mitral valve leaflet(s). There is mild calcification of the mitral valve leaflet(s). Moderate mitral annular calcification. Mild mitral valve regurgitation. No evidence of mitral valve stenosis. MV peak gradient, 10.9 mmHg. The mean mitral valve gradient is 3.0 mmHg. Tricuspid Valve: The tricuspid valve is normal in structure. Tricuspid valve regurgitation is moderate . No evidence of tricuspid stenosis. Aortic Valve: The aortic valve is tricuspid. There is mild calcification of the aortic valve. There is mild thickening of the aortic valve. Aortic valve regurgitation is not visualized. Aortic valve sclerosis/calcification is present, without any evidence of aortic stenosis. Aortic valve mean gradient measures 9.0 mmHg. Aortic valve peak gradient measures 15.7 mmHg. Aortic valve area, by VTI measures 2.22 cm. Pulmonic Valve: The pulmonic valve was not well visualized. Pulmonic valve regurgitation is mild. No evidence of pulmonic stenosis. Aorta: The aortic root, ascending aorta and aortic arch are all structurally normal, with no evidence of dilitation or obstruction. Venous: The inferior vena cava is normal in size with less than 50% respiratory variability, suggesting right atrial pressure of 8 mmHg.  IAS/Shunts: The atrial septum is grossly normal.  LEFT VENTRICLE PLAX 2D LVIDd:         4.40 cm   Diastology LVIDs:         3.10 cm   LV e' medial:    7.72 cm/s LV PW:         1.10 cm   LV E/e' medial:  17.6 LV IVS:        1.10 cm   LV e' lateral:   10.80 cm/s LVOT diam:     2.00  cm   LV E/e' lateral: 12.6 LV SV:         95 LV SV Index:   51 LVOT Area:     3.14 cm  RIGHT VENTRICLE            IVC RV Basal diam:  4.20 cm    IVC diam: 2.00 cm RV Mid diam:    3.20 cm RV S prime:     8.16 cm/s TAPSE (M-mode): 1.4 cm LEFT ATRIUM              Index        RIGHT ATRIUM           Index LA diam:        4.10 cm  2.23 cm/m   RA Area:     22.60 cm LA Vol (A2C):   108.0 ml 58.76 ml/m  RA Volume:   66.90 ml  36.40 ml/m LA Vol (A4C):   77.4 ml  42.11 ml/m LA Biplane Vol: 91.6 ml  49.84 ml/m  AORTIC VALVE AV Area (Vmax):    2.43 cm AV Area (Vmean):   2.23 cm AV Area (VTI):     2.22 cm AV Vmax:           198.00 cm/s AV Vmean:          141.000 cm/s AV VTI:            0.425 m AV Peak Grad:      15.7 mmHg AV Mean Grad:      9.0 mmHg LVOT Vmax:         153.00 cm/s LVOT Vmean:        100.000 cm/s LVOT VTI:          0.301 m LVOT/AV VTI ratio: 0.71  AORTA Ao Root diam: 3.20 cm Ao Asc diam:  3.40 cm MITRAL VALVE                TRICUSPID VALVE MV Area (PHT): 3.60 cm     TR Peak grad:   52.1 mmHg MV Area VTI:   2.46 cm     TR Vmax:        361.00 cm/s MV Peak grad:  10.9 mmHg MV Mean grad:  3.0 mmHg     SHUNTS MV Vmax:       1.65 m/s     Systemic VTI:  0.30 m MV Vmean:      70.7 cm/s    Systemic Diam: 2.00 cm MV Decel Time: 211 msec MV E velocity: 136.00 cm/s MV A velocity: 44.00 cm/s MV E/A ratio:  3.09 Buford Dresser MD Electronically signed by Buford Dresser MD Signature Date/Time: 02/15/2021/1:03:58 PM    Final     Scheduled Meds:  busPIRone  7.5 mg Oral BID   carvedilol  12.5 mg Oral BID   cefadroxil  1,000 mg Oral BID   gabapentin  100 mg Oral TID   mouth rinse  15 mL Mouth Rinse BID   vancomycin  125 mg  Oral QID   Continuous Infusions:  sodium chloride 75 mL/hr at 02/16/21 2145   promethazine (PHENERGAN) injection (IM or IVPB) 12.5 mg (02/17/21 1014)      LOS: 4 days   Shelly Coss, MD [facility] P2/11/2021, 10:22 AM

## 2021-02-17 NOTE — Assessment & Plan Note (Addendum)
Hospital course remarkable for poor oral intake, nausea..  She denies any abdominal pain or vomiting.  Her abdomen is benign.  Continue gentle IV fluids.  Diet advanced to regular.  Nausea better today

## 2021-02-17 NOTE — Progress Notes (Signed)
° °  Chart reviewed.  No new suggestions.  Please call over the weekend if there are questions.

## 2021-02-18 LAB — BASIC METABOLIC PANEL
Anion gap: 8 (ref 5–15)
BUN: 18 mg/dL (ref 8–23)
CO2: 24 mmol/L (ref 22–32)
Calcium: 8.8 mg/dL — ABNORMAL LOW (ref 8.9–10.3)
Chloride: 106 mmol/L (ref 98–111)
Creatinine, Ser: 0.66 mg/dL (ref 0.44–1.00)
GFR, Estimated: >60 mL/min (ref >60)
Glucose, Bld: 200 mg/dL — ABNORMAL HIGH (ref 70–99)
Potassium: 3.4 mmol/L — ABNORMAL LOW (ref 3.5–5.1)
Sodium: 138 mmol/L (ref 135–145)

## 2021-02-18 LAB — CBC WITH DIFFERENTIAL/PLATELET
Abs Immature Granulocytes: 0 10*3/uL (ref 0.00–0.07)
Basophils Absolute: 0 10*3/uL (ref 0.0–0.1)
Basophils Relative: 0 %
Eosinophils Absolute: 0 10*3/uL (ref 0.0–0.5)
Eosinophils Relative: 0 %
HCT: 34.3 % — ABNORMAL LOW (ref 36.0–46.0)
Hemoglobin: 10.9 g/dL — ABNORMAL LOW (ref 12.0–15.0)
Lymphocytes Relative: 5 %
Lymphs Abs: 0.5 10*3/uL — ABNORMAL LOW (ref 0.7–4.0)
MCH: 33.2 pg (ref 26.0–34.0)
MCHC: 31.8 g/dL (ref 30.0–36.0)
MCV: 104.6 fL — ABNORMAL HIGH (ref 80.0–100.0)
Monocytes Absolute: 0.2 10*3/uL (ref 0.1–1.0)
Monocytes Relative: 2 %
Neutro Abs: 8.5 10*3/uL — ABNORMAL HIGH (ref 1.7–7.7)
Neutrophils Relative %: 93 %
Platelets: 98 10*3/uL — ABNORMAL LOW (ref 150–400)
RBC: 3.28 MIL/uL — ABNORMAL LOW (ref 3.87–5.11)
RDW: 14 % (ref 11.5–15.5)
WBC: 9.1 10*3/uL (ref 4.0–10.5)
nRBC: 0 % (ref 0.0–0.2)

## 2021-02-18 MED ORDER — AMLODIPINE BESYLATE 5 MG PO TABS
5.0000 mg | ORAL_TABLET | Freq: Every day | ORAL | Status: DC
  Administered 2021-02-18 – 2021-02-20 (×3): 5 mg via ORAL
  Filled 2021-02-18 (×3): qty 1

## 2021-02-18 MED ORDER — TICAGRELOR 90 MG PO TABS
90.0000 mg | ORAL_TABLET | Freq: Two times a day (BID) | ORAL | Status: DC
  Administered 2021-02-18 – 2021-02-20 (×5): 90 mg via ORAL
  Filled 2021-02-18 (×5): qty 1

## 2021-02-18 MED ORDER — ASPIRIN EC 81 MG PO TBEC
81.0000 mg | DELAYED_RELEASE_TABLET | Freq: Every evening | ORAL | Status: DC
  Administered 2021-02-18 – 2021-02-19 (×2): 81 mg via ORAL
  Filled 2021-02-18 (×2): qty 1

## 2021-02-18 MED ORDER — GERHARDT'S BUTT CREAM
TOPICAL_CREAM | Freq: Three times a day (TID) | CUTANEOUS | Status: DC
  Filled 2021-02-18: qty 1

## 2021-02-18 MED ORDER — POTASSIUM CHLORIDE 20 MEQ PO PACK
40.0000 meq | PACK | Freq: Once | ORAL | Status: AC
  Administered 2021-02-18: 40 meq via ORAL
  Filled 2021-02-18: qty 2

## 2021-02-18 NOTE — Progress Notes (Signed)
PROGRESS NOTE  Andrea Kirk  ZWC:[id number] DOB: [birth year] DOA: 02/13/2021 PCP: Aura Dials, MD   Brief Narrative: Patient is a 81 year old female with history of CVA, coronary artery disease status post CABG, diastolic CHF, diabetes type 2, hypertension, hyperlipidemia, iron deficiency anemia, depression, CKD stage II who presented from skilled nursing facility due to altered mental status.  On presentation she was febrile, hypertensive, hypoxic on room air.  Lab work showed creatinine of 2, UA showed elevated leukocytes.  Chest x-ray showed mild bibasilar atelectasis or pneumonia, vascular congestion, small right pleural effusion.  Code sepsis was initiated in the emergency department and was given IV fluids.  Lab work also showed elevated troponin, cardiology consulted.  Blood cultures showed E. coli with source likely the urine.  She was having frequent diarrhea, C. difficile came out to positive.  Started on vancomycin.  Overall status including mental status have significantly improved.  Plan is to discharge back to skilled nursing facility when appropriate   Assessment & Plan:  * Acute encephalopathy Mental status has improved -Metabolic encephalopathy secondary to sepsis -Head CT did not show any acute intracranial abnormalities. --Currently she is alert and oriented  Gram-negative bacteremia Urine culture and blood cultures showing gram-negative rods: E. coli.  She was on ceftriaxone 2 g daily.Cx showed  pansensitive E. Coli.  Antibiotics changed to cefadroxil for total of 7 days course  Severe sepsis ([place]) Initially febrile and hypotensive. Currently sepsis resolved improved.  Continue current antibiotics.   Elevated troponin Elevated troponin.  Jumped from 394 to 637.  No report of chest pain.  Could be from supply demand ischemia from sepsis. cardiology was consulted, no plan for any further work-up.  Echocardiogram as documented  Acute renal failure superimposed on stage 2  chronic kidney disease ([place])- (present on admission) AKI has resolved  Essential hypertension- (present on admission) Restarted home Coreg Continue as needed medications for severe hypertension.  Currently she is normotensive   Acute respiratory failure with hypoxia ([place])- (present on admission) As on her hospital discharge in 04/2020 after a stroke she was discharged on home oxygen 2L  Currently on 2 L of oxygen per minute.  We will try to wean the oxygen  Chronic diastolic CHF (congestive heart failure) ([place])- (present on admission) -She was initially started on IV fluids now stopped.  Elevated BNP.  Chest x-ray showed vascular congestion -Echo done during this hospitalization showed EF of 50-55% with grade 3 diastolic dysfunction, Severely elevated pulmonary artery pressure. -intake/output -Given IV Lasix 1 dose, now  held -Cardiology was consulted   Thrombocytopenia ([place])- (present on admission) Unclear etiology but most likely secondary to bacteremia/sepsis.  We will continue to monitor.  No medications identified for the cause of potential thrombocytopenic effect  Iron deficiency anemia- (present on admission) There was report of passage of dark stool.  FOBT was positive.  On checking her previous lab works, her hemoglobin is in the range of 10.  Currently hemoglobin at baseline.  No further work-up planned as patient has C. difficile   HLD (hyperlipidemia)- (present on admission) Not on statin, no allergy unsure why   Controlled type 2 diabetes mellitus with hyperglycemia ([place])- (present on admission) No recent labs Takes  Glipizide, currently on hold SSI and routine accuchecks   hx of CVA (cerebral vascular accident) ([place])- (present on admission) Head CT with no acute finding today  Takes ASA/brillinta at home  Carotid artery disease ([place])- (present on admission) On aspirin and Brilinta at home. hemoglobin is  at baseline.  We will resume these meds as platelets level have  improved  Nausea Hospital course remarkable for poor oral intake, nausea..  She denies any abdominal pain or vomiting.  Her abdomen is benign.  Continue gentle IV fluids.  Diet advanced to regular.  Nutrition is consulted  C. difficile colitis Patient was having several loose bowel movements.  Also complaining of abdominal pain.  Abdominal/pelvis CT did not show any bowel inflammation.  C. difficile antigen came out to be positive without toxin.  Since patient was symptomatic with the diarrhea and abdominal pain, we started treatment . FOBT was found to be positive.  Hemoglobin is stable, so will not pursue any further work-up  Anxiety and depression On buspar/celexa We will resume on DC            DVT prophylaxis:Place and maintain sequential compression device Start: 02/14/21 2105 SCDs Start: 02/13/21 1451     Code Status: Full Code  Family Communication: Called and discussed with brother on phone on 2/11  Patient status:Inpatient  Patient is from: Skilled nursing facility  Anticipated discharge to:SNF  Estimated discharge date: likely tomorrow  Consultants: Cardiology  Procedures: None  Antimicrobials:  Anti-infectives (From admission, onward)    Start     Dose/Rate Route Frequency Ordered Stop   02/16/21 1000  cefadroxil (DURICEF) capsule 1,000 mg        1,000 mg Oral 2 times daily 02/16/21 0741 02/19/21 2359   02/15/21 1000  vancomycin (VANCOCIN) capsule 125 mg        125 mg Oral 4 times daily 02/15/21 0800 02/27/21 0959   02/14/21 1300  ceFEPIme (MAXIPIME) 2 g in sodium chloride 0.9 % 100 mL IVPB  Status:  Discontinued        2 g 200 mL/hr over 30 Minutes Intravenous Every 24 hours 02/13/21 1225 02/14/21 0423   02/14/21 1200  cefTRIAXone (ROCEPHIN) 2 g in sodium chloride 0.9 % 100 mL IVPB  Status:  Discontinued        2 g 200 mL/hr over 30 Minutes Intravenous Every 24 hours 02/14/21 0423 02/16/21 0741   02/13/21 1224  vancomycin variable dose per unstable  renal function (pharmacist dosing)  Status:  Discontinued         Does not apply See admin instructions 02/13/21 1225 02/14/21 0423   02/13/21 1130  ceFEPIme (MAXIPIME) 2 g in sodium chloride 0.9 % 100 mL IVPB        2 g 200 mL/hr over 30 Minutes Intravenous  Once 02/13/21 1123 02/13/21 1304   02/13/21 1130  vancomycin (VANCOREADY) IVPB 1500 mg/300 mL        1,500 mg 150 mL/hr over 120 Minutes Intravenous  Once 02/13/21 1123 02/13/21 1515   02/13/21 1115  metroNIDAZOLE (FLAGYL) IVPB 500 mg        500 mg 100 mL/hr over 60 Minutes Intravenous  Once 02/13/21 1101 02/13/21 1230       Subjective:  Patient seen and examined the bedside this morning.  Hemodynamically stable.  She feels better today and her nausea is better but she continues to have very poor oral intake.  I encouraged her to eat more.  Diet changed to regular.  Denies any abdominal pain.  Had a large loose bowel movement this morning  Objective: Vitals:   02/17/21 2224 02/18/21 0021 02/18/21 0428 02/18/21 0832  BP:  (!) 188/72 132/74 (!) 180/77  Pulse: 71 67 81 88  Resp: 20 16 16 16   Temp:  97.8 F (36.6 C) 97.8 F (36.6 C) 98 F (36.7 C)  TempSrc:  Oral Oral Oral  SpO2: 97% 96% (!) 88% 92%  Weight:      Height:        Intake/Output Summary (Last 24 hours) at 02/18/2021 1028 Last data filed at 02/18/2021 1062 Gross per 24 hour  Intake 2049.24 ml  Output 900 ml  Net 1149.24 ml   Filed Weights   02/13/21 1044 02/15/21 0500  Weight: 74.8 kg 76.3 kg    Examination:    General exam: Very deconditioned, chronically ill looking HEENT: PERRL Respiratory system:  no wheezes or crackles  Cardiovascular system: S1 & S2 heard, RRR.  Gastrointestinal system: Abdomen is nondistended, soft and nontender. Central nervous system: Alert and oriented Extremities: No edema, no clubbing ,no cyanosis Skin: No rashes, no ulcers,no icterus    Data Reviewed: I have personally reviewed following labs and imaging  studies  CBC: Recent Labs  Lab 02/13/21 1103 02/13/21 1536 02/14/21 0116 02/15/21 0200 02/16/21 0048 02/17/21 0107 02/18/21 0913  WBC 10.8*  --  11.0* 6.7 6.0 7.3 9.1  NEUTROABS 8.6*  --   --  5.9 4.4 5.5 8.5*  HGB 11.3*   < > 13.5 11.3* 10.1* 11.1* 10.9*  HCT 35.9*   < > 41.2 34.1* 32.0* 34.8* 34.3*  MCV 107.8*  --  106.7* 103.6* 105.6* 105.8* 104.6*  PLT 91*  --  86* 64* 59* 67* 98*   < > = values in this interval not displayed.   Basic Metabolic Panel: Recent Labs  Lab 02/13/21 1510 02/13/21 1536 02/14/21 0116 02/15/21 0200 02/16/21 0048 02/17/21 0107 02/18/21 0913  NA  --    < > 135 139 139 139 138  K  --    < > 4.1 3.6 3.7 3.8 3.4*  CL  --   --  102 103 103 103 106  CO2  --   --  20* 26 28 29 24   GLUCOSE  --   --  181* 176* 144* 164* 200*  BUN  --   --  52* 40* 36* 31* 18  CREATININE  --   --  1.67* 1.06* 0.92 0.85 0.66  CALCIUM  --   --  9.2 8.9 9.1 9.2 8.8*  MG 2.0  --   --   --   --   --   --    < > = values in this interval not displayed.     Recent Results (from the past 240 hour(s))  Blood Culture (routine x 2)     Status: Abnormal   Collection Time: 02/13/21 10:53 AM   Specimen: BLOOD  Result Value Ref Range Status   Specimen Description BLOOD RIGHT ANTECUBITAL  Final   Special Requests   Final    BOTTLES DRAWN AEROBIC AND ANAEROBIC Blood Culture adequate volume   Culture  Setup Time   Final    GRAM NEGATIVE RODS IN BOTH AEROBIC AND ANAEROBIC BOTTLES CRITICAL VALUE NOTED.  VALUE IS CONSISTENT WITH PREVIOUSLY REPORTED AND CALLED VALUE.    Culture (A)  Final    ESCHERICHIA COLI SUSCEPTIBILITIES PERFORMED ON PREVIOUS CULTURE WITHIN THE LAST 5 DAYS. Performed at [facility], [place] [address], [place], [place] [address]    Report Status 02/17/2021 FINAL  Final  Resp Panel by RT-PCR (Flu A&B, Covid) Nasopharyngeal Swab     Status: None   Collection Time: 02/13/21 11:00 AM   Specimen: Nasopharyngeal Swab; Nasopharyngeal(NP) swabs in vial  transport medium  Result Value Ref Range Status   SARS Coronavirus 2 by RT PCR NEGATIVE NEGATIVE Final    Comment: (NOTE) SARS-CoV-2 target nucleic acids are NOT DETECTED.  The SARS-CoV-2 RNA is generally detectable in upper respiratory specimens during the acute phase of infection. The lowest concentration of SARS-CoV-2 viral copies this assay can detect is 138 copies/mL. A negative result does not preclude SARS-Cov-2 infection and should not be used as the sole basis for treatment or other patient management decisions. A negative result may occur with  improper specimen collection/handling, submission of specimen other than nasopharyngeal swab, presence of viral mutation(s) within the areas targeted by this assay, and inadequate number of viral copies(<138 copies/mL). A negative result must be combined with clinical observations, patient history, and epidemiological information. The expected result is Negative.  Fact Sheet for Patients:  EntrepreneurPulse.com.au  Fact Sheet for Healthcare Providers:  IncredibleEmployment.be  This test is no t yet approved or cleared by the [place] FDA and  has been authorized for detection and/or diagnosis of SARS-CoV-2 by FDA under an Emergency Use Authorization (EUA). This EUA will remain  in effect (meaning this test can be used) for the duration of the COVID-19 declaration under Section 564(b)(1) of the Act, 21 U.S.C.section 360bbb-3(b)(1), unless the authorization is terminated  or revoked sooner.       Influenza A by PCR NEGATIVE NEGATIVE Final   Influenza B by PCR NEGATIVE NEGATIVE Final    Comment: (NOTE) The Xpert Xpress SARS-CoV-2/FLU/RSV plus assay is intended as an aid in the diagnosis of influenza from Nasopharyngeal swab specimens and should not be used as a sole basis for treatment. Nasal washings and aspirates are unacceptable for Xpert Xpress SARS-CoV-2/FLU/RSV testing.  Fact  Sheet for Patients: EntrepreneurPulse.com.au  Fact Sheet for Healthcare Providers: IncredibleEmployment.be  This test is not yet approved or cleared by the [place] FDA and has been authorized for detection and/or diagnosis of SARS-CoV-2 by FDA under an Emergency Use Authorization (EUA). This EUA will remain in effect (meaning this test can be used) for the duration of the COVID-19 declaration under Section 564(b)(1) of the Act, 21 U.S.C. section 360bbb-3(b)(1), unless the authorization is terminated or revoked.  Performed at [facility], [place] [address] [place], [place] [address]   Blood Culture (routine x 2)     Status: Abnormal   Collection Time: 02/13/21 11:00 AM   Specimen: BLOOD  Result Value Ref Range Status   Specimen Description BLOOD SITE NOT SPECIFIED  Final   Special Requests   Final    BOTTLES DRAWN AEROBIC AND ANAEROBIC Blood Culture results may not be optimal due to an inadequate volume of blood received in culture bottles   Culture  Setup Time   Final    GRAM NEGATIVE RODS IN BOTH AEROBIC AND ANAEROBIC BOTTLES CRITICAL RESULT CALLED TO, READ BACK BY AND VERIFIED WITH: V BRYK,[id number]  02/14/21 [place] Performed at [facility], [address], [place], [place] [address]    Culture ESCHERICHIA COLI (A)  Final   Report Status 02/16/2021 FINAL  Final   Organism ID, Bacteria ESCHERICHIA COLI  Final      Susceptibility   Escherichia coli - MIC*    AMPICILLIN <=2 SENSITIVE Sensitive     CEFAZOLIN <=4 SENSITIVE Sensitive     CEFEPIME <=0.12 SENSITIVE Sensitive     CEFTAZIDIME <=1 SENSITIVE Sensitive     CEFTRIAXONE <=0.25 SENSITIVE Sensitive     CIPROFLOXACIN <=0.25 SENSITIVE Sensitive  GENTAMICIN <=1 SENSITIVE Sensitive     IMIPENEM <=0.25 SENSITIVE Sensitive     TRIMETH/SULFA <=20 SENSITIVE Sensitive     AMPICILLIN/SULBACTAM <=2 SENSITIVE Sensitive     PIP/TAZO <=4 SENSITIVE Sensitive     * ESCHERICHIA COLI   Blood Culture ID Panel (Reflexed)     Status: Abnormal   Collection Time: 02/13/21 11:00 AM  Result Value Ref Range Status   Enterococcus faecalis NOT DETECTED NOT DETECTED Final   Enterococcus Faecium NOT DETECTED NOT DETECTED Final   Listeria monocytogenes NOT DETECTED NOT DETECTED Final   Staphylococcus species NOT DETECTED NOT DETECTED Final   Staphylococcus aureus (BCID) NOT DETECTED NOT DETECTED Final   Staphylococcus epidermidis NOT DETECTED NOT DETECTED Final   Staphylococcus lugdunensis NOT DETECTED NOT DETECTED Final   Streptococcus species NOT DETECTED NOT DETECTED Final   Streptococcus agalactiae NOT DETECTED NOT DETECTED Final   Streptococcus pneumoniae NOT DETECTED NOT DETECTED Final   Streptococcus pyogenes NOT DETECTED NOT DETECTED Final   A.calcoaceticus-baumannii NOT DETECTED NOT DETECTED Final   Bacteroides fragilis NOT DETECTED NOT DETECTED Final   Enterobacterales DETECTED (A) NOT DETECTED Final    Comment: Enterobacterales represent a large order of gram negative bacteria, not a single organism. CRITICAL RESULT CALLED TO, READ BACK BY AND VERIFIED WITH: V BRYK,PHARMD@[id number]  02/14/21 [place]    Enterobacter cloacae complex NOT DETECTED NOT DETECTED Final   Escherichia coli DETECTED (A) NOT DETECTED Final    Comment: CRITICAL RESULT CALLED TO, READ BACK BY AND VERIFIED WITH: V BRYK,PHARMD@[id number]  02/14/21 [place]    Klebsiella aerogenes NOT DETECTED NOT DETECTED Final   Klebsiella oxytoca NOT DETECTED NOT DETECTED Final   Klebsiella pneumoniae NOT DETECTED NOT DETECTED Final   Proteus species NOT DETECTED NOT DETECTED Final   Salmonella species NOT DETECTED NOT DETECTED Final   Serratia marcescens NOT DETECTED NOT DETECTED Final   Haemophilus influenzae NOT DETECTED NOT DETECTED Final   Neisseria meningitidis NOT DETECTED NOT DETECTED Final   Pseudomonas aeruginosa NOT DETECTED NOT DETECTED Final   Stenotrophomonas maltophilia NOT DETECTED NOT DETECTED Final   Candida  albicans NOT DETECTED NOT DETECTED Final   Candida auris NOT DETECTED NOT DETECTED Final   Candida glabrata NOT DETECTED NOT DETECTED Final   Candida krusei NOT DETECTED NOT DETECTED Final   Candida parapsilosis NOT DETECTED NOT DETECTED Final   Candida tropicalis NOT DETECTED NOT DETECTED Final   Cryptococcus neoformans/gattii NOT DETECTED NOT DETECTED Final   CTX-M ESBL NOT DETECTED NOT DETECTED Final   Carbapenem resistance IMP NOT DETECTED NOT DETECTED Final   Carbapenem resistance KPC NOT DETECTED NOT DETECTED Final   Carbapenem resistance NDM NOT DETECTED NOT DETECTED Final   Carbapenem resist OXA 48 LIKE NOT DETECTED NOT DETECTED Final   Carbapenem resistance VIM NOT DETECTED NOT DETECTED Final    Comment: Performed at [facility], [address] [place], [place] [address]  Urine Culture     Status: Abnormal   Collection Time: 02/13/21 11:15 AM   Specimen: In/Out Cath Urine  Result Value Ref Range Status   Specimen Description IN/OUT CATH URINE  Final   Special Requests   Final    NONE Performed at [facility], [place] [address] [place], [place] [address]    Culture >=100,000 COLONIES/mL ESCHERICHIA COLI (A)  Final   Report Status 02/15/2021 FINAL  Final   Organism ID, Bacteria ESCHERICHIA COLI (A)  Final      Susceptibility   Escherichia coli - MIC*  AMPICILLIN <=2 SENSITIVE Sensitive     CEFAZOLIN <=4 SENSITIVE Sensitive     CEFEPIME <=0.12 SENSITIVE Sensitive     CEFTRIAXONE <=0.25 SENSITIVE Sensitive     CIPROFLOXACIN <=0.25 SENSITIVE Sensitive     GENTAMICIN <=1 SENSITIVE Sensitive     IMIPENEM <=0.25 SENSITIVE Sensitive     NITROFURANTOIN <=16 SENSITIVE Sensitive     TRIMETH/SULFA <=20 SENSITIVE Sensitive     AMPICILLIN/SULBACTAM <=2 SENSITIVE Sensitive     PIP/TAZO <=4 SENSITIVE Sensitive     * >=100,000 COLONIES/mL ESCHERICHIA COLI  MRSA Next Gen by PCR, Nasal     Status: None   Collection Time: 02/14/21  3:29 PM   Specimen: Nasal Mucosa; Nasal  Swab  Result Value Ref Range Status   MRSA by PCR Next Gen NOT DETECTED NOT DETECTED Final    Comment: (NOTE) The GeneXpert MRSA Assay (FDA approved for NASAL specimens only), is one component of a comprehensive MRSA colonization surveillance program. It is not intended to diagnose MRSA infection nor to guide or monitor treatment for MRSA infections. Test performance is not FDA approved in patients less than 38 years old. Performed at [facility], [place] [address], [place], [place] [address]   C Difficile Quick Screen w PCR reflex     Status: Abnormal   Collection Time: 02/14/21  9:04 PM   Specimen: STOOL  Result Value Ref Range Status   C Diff antigen POSITIVE (A) NEGATIVE Final   C Diff toxin NEGATIVE NEGATIVE Final   C Diff interpretation Results are indeterminate. See PCR results.  Final    Comment: Performed at [facility], [place] [address], [place], [place] [address]  C. Diff by PCR, Reflexed     Status: Abnormal   Collection Time: 02/14/21  9:04 PM  Result Value Ref Range Status   Toxigenic C. Difficile by PCR POSITIVE (A) NEGATIVE Final    Comment: Positive for toxigenic C. difficile with little to no toxin production. Only treat if clinical presentation suggests symptomatic illness. Performed at [facility], [place] [address], [place], [place] [address]      Radiology Studies: No results found.  Scheduled Meds:  amLODipine  5 mg Oral Daily   aspirin EC  81 mg Oral QPM   busPIRone  7.5 mg Oral BID   carvedilol  12.5 mg Oral BID   cefadroxil  1,000 mg Oral BID   gabapentin  100 mg Oral TID   Gerhardt's butt cream   Topical TID   mouth rinse  15 mL Mouth Rinse BID   metoCLOPramide (REGLAN) injection  10 mg Intravenous Q8H   potassium chloride  40 mEq Oral Once   ticagrelor  90 mg Oral BID   vancomycin  125 mg Oral QID   Continuous Infusions:  sodium chloride 75 mL/hr at 02/17/21 1221   promethazine (PHENERGAN) injection (IM or IVPB) 12.5 mg  (02/18/21 0010)      LOS: 5 days   Shelly Coss, MD [facility] P2/12/2021, 10:28 AM

## 2021-02-19 LAB — CBC WITH DIFFERENTIAL/PLATELET
Abs Immature Granulocytes: 0.1 10*3/uL — ABNORMAL HIGH (ref 0.00–0.07)
Basophils Absolute: 0.1 10*3/uL (ref 0.0–0.1)
Basophils Relative: 0 %
Eosinophils Absolute: 0.1 10*3/uL (ref 0.0–0.5)
Eosinophils Relative: 1 %
HCT: 36.9 % (ref 36.0–46.0)
Hemoglobin: 12.1 g/dL (ref 12.0–15.0)
Immature Granulocytes: 1 %
Lymphocytes Relative: 12 %
Lymphs Abs: 1.4 10*3/uL (ref 0.7–4.0)
MCH: 33.7 pg (ref 26.0–34.0)
MCHC: 32.8 g/dL (ref 30.0–36.0)
MCV: 102.8 fL — ABNORMAL HIGH (ref 80.0–100.0)
Monocytes Absolute: 0.8 10*3/uL (ref 0.1–1.0)
Monocytes Relative: 7 %
Neutro Abs: 9.4 10*3/uL — ABNORMAL HIGH (ref 1.7–7.7)
Neutrophils Relative %: 79 %
Platelets: 131 10*3/uL — ABNORMAL LOW (ref 150–400)
RBC: 3.59 MIL/uL — ABNORMAL LOW (ref 3.87–5.11)
RDW: 13.9 % (ref 11.5–15.5)
WBC: 11.8 10*3/uL — ABNORMAL HIGH (ref 4.0–10.5)
nRBC: 0 % (ref 0.0–0.2)

## 2021-02-19 LAB — BASIC METABOLIC PANEL
Anion gap: 10 (ref 5–15)
BUN: 13 mg/dL (ref 8–23)
CO2: 23 mmol/L (ref 22–32)
Calcium: 9 mg/dL (ref 8.9–10.3)
Chloride: 106 mmol/L (ref 98–111)
Creatinine, Ser: 0.6 mg/dL (ref 0.44–1.00)
GFR, Estimated: >60 mL/min (ref >60)
Glucose, Bld: 192 mg/dL — ABNORMAL HIGH (ref 70–99)
Potassium: 3.8 mmol/L (ref 3.5–5.1)
Sodium: 139 mmol/L (ref 135–145)

## 2021-02-19 MED ORDER — ENSURE ENLIVE PO LIQD
237.0000 mL | Freq: Three times a day (TID) | ORAL | Status: DC
  Administered 2021-02-19 – 2021-02-20 (×3): 237 mL via ORAL

## 2021-02-19 MED ORDER — ADULT MULTIVITAMIN W/MINERALS CH
1.0000 | ORAL_TABLET | Freq: Every day | ORAL | Status: DC
  Administered 2021-02-19 – 2021-02-20 (×2): 1 via ORAL
  Filled 2021-02-19 (×2): qty 1

## 2021-02-19 MED ORDER — LOPERAMIDE HCL 2 MG PO CAPS
2.0000 mg | ORAL_CAPSULE | Freq: Four times a day (QID) | ORAL | Status: DC | PRN
  Administered 2021-02-19 (×2): 2 mg via ORAL
  Filled 2021-02-19 (×2): qty 1

## 2021-02-19 NOTE — Progress Notes (Signed)
Physical Therapy Treatment Patient Details Name: Andrea Kirk MRN: [id number] DOB: [birth year] Today's Date: 02/19/2021   History of Present Illness Arturo L Towe is a 81 y.o. female admitted 02/13/21 from SNF ([facility]) due to altered mental status. CT negative for acute abnormality. Pt has been having watery frequent stools since arrival to ED. PMH includes CVA, carotid artery disease with CABG, diastolic CHF, O8NO, HTN, HLD, iron deficiency anemia, depression CKD stage 2    PT Comments    Pt sleeping and less responsive to PT upon arrival to room, becomes increasingly responsive throughout session. Pt overall requiring min assist for mobility tasks, including short functional bouts of gait, repeated standing, and in/out of bed. Pt with limited activity tolerance, but appears improving vs eval. PT to continue to follow, SNF remains appropriate.     Recommendations for follow up therapy are one component of a multi-disciplinary discharge planning process, led by the attending physician.  Recommendations may be updated based on patient status, additional functional criteria and insurance authorization.  Follow Up Recommendations  Skilled nursing-short term rehab (<3 hours/day)     Assistance Recommended at Discharge Frequent or constant Supervision/Assistance  Patient can return home with the following A little help with walking and/or transfers;A little help with bathing/dressing/bathroom;Help with stairs or ramp for entrance;Assist for transportation;Direct supervision/assist for medications management   Equipment Recommendations  None recommended by PT    Recommendations for Other Services       Precautions / Restrictions Precautions Precautions: Fall Precaution Comments: +Cdiff Restrictions Weight Bearing Restrictions: No     Mobility  Bed Mobility Overal bed mobility: Needs Assistance Bed Mobility: Supine to Sit     Supine to sit: Min assist     General bed mobility  comments: Assist for completion of trunk rise and scooting to EOB.    Transfers Overall transfer level: Needs assistance Equipment used: Rollator (4 wheels) Transfers: Sit to/from Stand, Bed to chair/wheelchair/BSC Sit to Stand: Mod assist, Min assist Stand pivot transfers: Min assist         General transfer comment: mod assist for initial stand for power up and rise, transitioning to min assist when repeated. STS x3, from EOB, chair, and BSC.    Ambulation/Gait Ambulation/Gait assistance: Min assist Gait Distance (Feet): 20 Feet Assistive device: Rollator (4 wheels) Gait Pattern/deviations: Step-through pattern, Decreased stride length, Shuffle, Trunk flexed Gait velocity: decr     General Gait Details: worsening trunk flexion with fatigue, assist for steadying, room navigation.   Stairs             Wheelchair Mobility    Modified Rankin (Stroke Patients Only)       Balance Overall balance assessment: Needs assistance Sitting-balance support: Feet supported, No upper extremity supported Sitting balance-Leahy Scale: Fair     Standing balance support: During functional activity, Bilateral upper extremity supported Standing balance-Leahy Scale: Poor                              Cognition Arousal/Alertness: Awake/alert Behavior During Therapy: WFL for tasks assessed/performed Overall Cognitive Status: Impaired/Different from baseline Area of Impairment: Attention, Memory, Following commands, Safety/judgement, Problem solving                   Current Attention Level: Sustained Memory: Decreased short-term memory, Decreased recall of precautions Following Commands: Follows one step commands with increased time Safety/Judgement: Decreased awareness of safety, Decreased awareness of deficits   Problem  Solving: Decreased initiation, Requires verbal cues, Requires tactile cues          Exercises      General Comments         Pertinent Vitals/Pain Pain Assessment Pain Assessment: Faces Faces Pain Scale: Hurts little more Pain Location: stomach/abdomen Pain Descriptors / Indicators: Discomfort Pain Intervention(s): Limited activity within patient's tolerance, Monitored during session, Repositioned    Home Living                          Prior Function            PT Goals (current goals can now be found in the care plan section) Acute Rehab PT Goals PT Goal Formulation: With patient Time For Goal Achievement: 02/28/21 Potential to Achieve Goals: Fair Progress towards PT goals: Progressing toward goals    Frequency    Min 2X/week      PT Plan Current plan remains appropriate    Co-evaluation              AM-PAC PT "6 Clicks" Mobility   Outcome Measure  Help needed turning from your back to your side while in a flat bed without using bedrails?: A Little Help needed moving from lying on your back to sitting on the side of a flat bed without using bedrails?: A Little Help needed moving to and from a bed to a chair (including a wheelchair)?: A Lot (cuing) Help needed standing up from a chair using your arms (e.g., wheelchair or bedside chair)?: A Little Help needed to walk in hospital room?: A Lot (cuing) Help needed climbing 3-5 steps with a railing? : Total 6 Click Score: 14    End of Session   Activity Tolerance: Patient limited by lethargy;Patient limited by fatigue Patient left: with call bell/phone within reach;in chair;with chair alarm set Nurse Communication: Mobility status;Other (comment) (needs new purewick and linen change) PT Visit Diagnosis: Unsteadiness on feet (R26.81);Muscle weakness (generalized) (M62.81);Difficulty in walking, not elsewhere classified (R26.2)     Time: 0964-3838 PT Time Calculation (min) (ACUTE ONLY): 31 min  Charges:  $Therapeutic Activity: 23-37 mins                     Stacie Glaze, PT DPT Acute Rehabilitation Services Pager  [phone]  Office [phone]    Louis Matte 02/19/2021, 4:28 PM

## 2021-02-19 NOTE — Plan of Care (Signed)

## 2021-02-19 NOTE — Progress Notes (Signed)
Initial Nutrition Assessment  DOCUMENTATION CODES:  Not applicable  INTERVENTION:  Continue regular diet and add ordering assistance.   Recommend adding feeding assistance with meals.  Add Ensure Plus High Protein po TID, each supplement provides 350 kcal and 20 grams of protein.   Add MVI with minerals daily.  Encourage PO and supplement intake.  NUTRITION DIAGNOSIS:  Inadequate oral intake related to poor appetite, diarrhea as evidenced by per patient/family report, meal completion < 25%.  GOAL:  Patient will meet greater than or equal to 90% of their needs  MONITOR:  PO intake, Supplement acceptance, Labs, Weight trends, I & O's  REASON FOR ASSESSMENT:  Consult Assessment of nutrition requirement/status  ASSESSMENT:  81 yo female with a PMH of CVA, CAD s/p CABG, diastolic CHF, F6OZ, HTN, HLD, iron deficiency anemia, depression, and CKD stage 2 who presented from SNF due to altered mental status. Found to C.diff positive. Admitted with acute encephalopathy.  Pt with 0% PO average over the past 8 meals. Though, documentation is limited.  Pt requiring frequent care.  Pt may benefit from assistance with ordering meals and with feeding at meals.  If poor PO intake continues, RD to recommend nutrition support.  Spoke with pt at bedside. Pt reports little appetite, but is willing to drink Ensure while admitted and getting appetite back.  Pt unsure of any weight changes, but she does not think her weight has changed recently.  Per Epic, pt has lost ~11 lbs (6.3%) in the last 10 months, which is not necessarily significant for the time frame.  Medications: reviewed; Vancomycin PO TID, NaCl @ 100 ml/hr  Labs: reviewed; Glucose 192 (H) HbA1c: 6.1% (06/23/2020)  NUTRITION - FOCUSED PHYSICAL EXAM: Flowsheet Row Most Recent Value  Orbital Region Mild depletion  Upper Arm Region No depletion  Thoracic and Lumbar Region No depletion  Buccal Region No depletion  Temple  Region Mild depletion  Clavicle Bone Region No depletion  Clavicle and Acromion Bone Region No depletion  Scapular Bone Region No depletion  Dorsal Hand No depletion  Patellar Region No depletion  Anterior Thigh Region No depletion  Posterior Calf Region No depletion  Edema (RD Assessment) None  Hair Reviewed  Eyes Reviewed  Mouth Reviewed  Skin Reviewed  Nails Reviewed   Diet Order:   Diet Order             Diet regular Room service appropriate? Yes with Assist; Fluid consistency: Thin  Diet effective now                  EDUCATION NEEDS:  Education needs have been addressed  Skin:  Skin Assessment: Reviewed RN Assessment  Last BM:  02/19/21 - x2 so far, Type 7  Height:  Ht Readings from Last 1 Encounters:  02/13/21 5\' 5"  (1.651 m)   Weight:  Wt Readings from Last 1 Encounters:  02/15/21 76.3 kg   BMI:  Body mass index is 27.99 kg/m.  Estimated Nutritional Needs:  Kcal:  1800-2000 Protein:  95-110 grams Fluid:  >1.8 L  Derrel Nip, RD, LDN (she/her/hers) Clinical Inpatient Dietitian RD Pager/After-Hours/Weekend Pager # in [place]

## 2021-02-19 NOTE — Progress Notes (Signed)
Beginning of shift patient was given peri-care and a full linen change.  Patient has CDIFF and requires frequent care.  Shortly after patient required continued pericare and a pad change.  This RN received a phone call from patients brother who claims to be a provider in the state of [place].  Patient reported to brother that no nursing care or therapy care has been given. She reported bed sores. Brother communicated concern to "call administration" to "figure out what is going on with sub-standard care." This RN assured him that she has received daily cares and therapy.  This RN summarized  notes to the brother and attempted to reassure him that there has been some reports of confusion and we have consistent documentation of all care received. This RN did a full head to toe assessment and attempted to speak with the patient about what her concerns were and what she felt was not done for her. She more or less repeated what the brother reported. Patient reported having bed sores.  After a full integument assessment it was concluded there are NO bed sores on this patient in any area. Perineum is red from frequent CDIFF diarrhea. The area is cleaned and lathered with prescription perineum cream at each change. MD, DD, AD, CN, CM, and SW have all been informed and updated.

## 2021-02-19 NOTE — Progress Notes (Signed)
PROGRESS NOTE  Andrea Kirk  HWT:[id number] DOB: 21-Dec-1940 DOA: 02/13/2021 PCP: Aura Dials, MD   Brief Narrative: Patient is a 81 year old female with history of CVA, coronary artery disease status post CABG, diastolic CHF, diabetes type 2, hypertension, hyperlipidemia, iron deficiency anemia, depression, CKD stage II who presented from skilled nursing facility due to altered mental status.  On presentation she was febrile, hypertensive, hypoxic on room air.  Lab work showed creatinine of 2, UA showed elevated leukocytes.  Chest x-ray showed mild bibasilar atelectasis or pneumonia, vascular congestion, small right pleural effusion.  Code sepsis was initiated in the emergency department and was given IV fluids.  Lab work also showed elevated troponin, cardiology consulted.  Blood cultures showed E. coli with source likely the urine.  She was having frequent diarrhea, C. difficile came out to positive.  Started on vancomycin.  Overall status including mental status have significantly improved.  Plan is to discharge back to skilled nursing facility when appropriate,likely tomorrow   Assessment & Plan:  * Acute encephalopathy Mental status has improved -Metabolic encephalopathy secondary to sepsis -Head CT did not show any acute intracranial abnormalities. --Currently she is alert and oriented  Gram-negative bacteremia Urine culture and blood cultures showing gram-negative rods: E. coli.  She was on ceftriaxone 2 g daily.Cx showed  pansensitive E. Coli.  Antibiotics changed to cefadroxil for total of 7 days course  Severe sepsis ([place]) Initially febrile and hypotensive. Currently sepsis resolved improved.  Continue current antibiotics.   Elevated troponin Elevated troponin.  Jumped from 394 to 637.  No report of chest pain.  Could be from supply demand ischemia from sepsis. cardiology was consulted, no plan for any further work-up.  Echocardiogram as documented  Acute renal failure  superimposed on stage 2 chronic kidney disease ([place])- (present on admission) AKI has resolved  Essential hypertension- (present on admission) Restarted home Coreg Continue as needed medications for severe hypertension.  Currently she is normotensive   Acute respiratory failure with hypoxia ([place])- (present on admission) As on her hospital discharge in 04/2020 after a stroke she was discharged on home oxygen 2L  Currently on 2 L of oxygen per minute.  We will try to wean the oxygen  Chronic diastolic CHF (congestive heart failure) ([place])- (present on admission) -She was initially started on IV fluids now stopped.  Elevated BNP.  Chest x-ray showed vascular congestion -Echo done during this hospitalization showed EF of 50-55% with grade 3 diastolic dysfunction, Severely elevated pulmonary artery pressure. -intake/output -Given IV Lasix 1 dose, now  held -Cardiology was consulted   Thrombocytopenia ([place])- (present on admission) Unclear etiology but most likely secondary to bacteremia/sepsis. Improving now  Iron deficiency anemia- (present on admission) There was report of passage of dark stool.  FOBT was positive.  On checking her previous lab works, her hemoglobin is in the range of 10.  Currently hemoglobin at baseline.  No further work-up planned as patient has C. difficile   HLD (hyperlipidemia)- (present on admission) Not on statin, no allergy unsure why   Controlled type 2 diabetes mellitus with hyperglycemia ([place])- (present on admission) No recent labs Takes  Glipizide, currently on hold SSI and routine accuchecks   hx of CVA (cerebral vascular accident) ([place])- (present on admission) Head CT with no acute finding today  Takes ASA/brillinta at home  Carotid artery disease ([place])- (present on admission) On aspirin and Brilinta at home. hemoglobin is at baseline.  We will resume these meds as platelets level have improved  Nausea Hospital course remarkable for poor oral intake,  nausea..  She denies any abdominal pain or vomiting.  Her abdomen is benign.  Continue gentle IV fluids.  Diet advanced to regular.  Nausea better today  C. difficile colitis Patient was having several loose bowel movements.  Also complaining of abdominal pain.  Abdominal/pelvis CT did not show any bowel inflammation.  C. difficile antigen came out to be positive without toxin.  Since patient was symptomatic with the diarrhea and abdominal pain, we started treatment . FOBT was found to be positive.  Hemoglobin is stable, so will not pursue any further work-up. Still having loose bowel movements,.  Continue vancomycin, added Imodium, continue fluids  Anxiety and depression On buspar/celexa We will resume on DC       Nutrition Problem: Inadequate oral intake Etiology: poor appetite, diarrhea    DVT prophylaxis:Place and maintain sequential compression device Start: 02/14/21 2105 SCDs Start: 02/13/21 1451     Code Status: Full Code  Family Communication: Called and discussed with brother on phone on 2/13  Patient status:Inpatient  Patient is from: Skilled nursing facility  Anticipated discharge to:SNF  Estimated discharge date: likely tomorrow  Consultants: Cardiology  Procedures: None  Antimicrobials:  Anti-infectives (From admission, onward)    Start     Dose/Rate Route Frequency Ordered Stop   02/16/21 1000  cefadroxil (DURICEF) capsule 1,000 mg        1,000 mg Oral 2 times daily 02/16/21 0741 02/19/21 2359   02/15/21 1000  vancomycin (VANCOCIN) capsule 125 mg        125 mg Oral 4 times daily 02/15/21 0800 02/27/21 0959   02/14/21 1300  ceFEPIme (MAXIPIME) 2 g in sodium chloride 0.9 % 100 mL IVPB  Status:  Discontinued        2 g 200 mL/hr over 30 Minutes Intravenous Every 24 hours 02/13/21 1225 02/14/21 0423   02/14/21 1200  cefTRIAXone (ROCEPHIN) 2 g in sodium chloride 0.9 % 100 mL IVPB  Status:  Discontinued        2 g 200 mL/hr over 30 Minutes Intravenous  Every 24 hours 02/14/21 0423 02/16/21 0741   02/13/21 1224  vancomycin variable dose per unstable renal function (pharmacist dosing)  Status:  Discontinued         Does not apply See admin instructions 02/13/21 1225 02/14/21 0423   02/13/21 1130  ceFEPIme (MAXIPIME) 2 g in sodium chloride 0.9 % 100 mL IVPB        2 g 200 mL/hr over 30 Minutes Intravenous  Once 02/13/21 1123 02/13/21 1304   02/13/21 1130  vancomycin (VANCOREADY) IVPB 1500 mg/300 mL        1,500 mg 150 mL/hr over 120 Minutes Intravenous  Once 02/13/21 1123 02/13/21 1515   02/13/21 1115  metroNIDAZOLE (FLAGYL) IVPB 500 mg        500 mg 100 mL/hr over 60 Minutes Intravenous  Once 02/13/21 1101 02/13/21 1230       Subjective:  Patient seen and examined at the bedside this morning.  Hemodynamically stable.  Her mental status is much better today, more alert and fully oriented.  She complains of persistent diarrhea.  She was concerned with pressure ulcers but on examination there were no pressure ulcers but just redness of the perineum.  She does not complain of nausea today.  Objective: Vitals:   02/18/21 2000 02/18/21 2351 02/19/21 0426 02/19/21 0810  BP: (!) 141/87 (!) 170/73 (!) 190/80 (!) 171/84  Pulse: 79 79  91 (!) 101  Resp: 16 20 20 14   Temp: 98.1 F (36.7 C) 99.1 F (37.3 C) 97.7 F (36.5 C) 98.3 F (36.8 C)  TempSrc: Oral Oral Oral Oral  SpO2: 94% 90% (!) 89% 92%  Weight:      Height:        Intake/Output Summary (Last 24 hours) at 02/19/2021 1135 Last data filed at 02/19/2021 0600 Gross per 24 hour  Intake 1854.17 ml  Output 925 ml  Net 929.17 ml   Filed Weights   02/13/21 1044 02/15/21 0500  Weight: 74.8 kg 76.3 kg    Examination:  General exam: Overall comfortable, not in distress, very deconditioned, weak, chronically looking HEENT: PERRL Respiratory system:  no wheezes or crackles  Cardiovascular system: S1 & S2 heard, RRR.  Gastrointestinal system: Abdomen is nondistended, soft and  nontender. Central nervous system: Alert and oriented Extremities: No edema, no clubbing ,no cyanosis Skin: Reddish discoloration around the perineum because of persistent diarrhea, no ulcers  Data Reviewed: I have personally reviewed following labs and imaging studies  CBC: Recent Labs  Lab 02/15/21 0200 02/16/21 0048 02/17/21 0107 02/18/21 0913 02/19/21 0636  WBC 6.7 6.0 7.3 9.1 11.8*  NEUTROABS 5.9 4.4 5.5 8.5* 9.4*  HGB 11.3* 10.1* 11.1* 10.9* 12.1  HCT 34.1* 32.0* 34.8* 34.3* 36.9  MCV 103.6* 105.6* 105.8* 104.6* 102.8*  PLT 64* 59* 67* 98* 921*   Basic Metabolic Panel: Recent Labs  Lab 02/13/21 1510 02/13/21 1536 02/15/21 0200 02/16/21 0048 02/17/21 0107 02/18/21 0913 02/19/21 0200  NA  --    < > 139 139 139 138 139  K  --    < > 3.6 3.7 3.8 3.4* 3.8  CL  --    < > 103 103 103 106 106  CO2  --    < > 26 28 29 24 23   GLUCOSE  --    < > 176* 144* 164* 200* 192*  BUN  --    < > 40* 36* 31* 18 13  CREATININE  --    < > 1.06* 0.92 0.85 0.66 0.60  CALCIUM  --    < > 8.9 9.1 9.2 8.8* 9.0  MG 2.0  --   --   --   --   --   --    < > = values in this interval not displayed.     Recent Results (from the past 240 hour(s))  Blood Culture (routine x 2)     Status: Abnormal   Collection Time: 02/13/21 10:53 AM   Specimen: BLOOD  Result Value Ref Range Status   Specimen Description BLOOD RIGHT ANTECUBITAL  Final   Special Requests   Final    BOTTLES DRAWN AEROBIC AND ANAEROBIC Blood Culture adequate volume   Culture  Setup Time   Final    GRAM NEGATIVE RODS IN BOTH AEROBIC AND ANAEROBIC BOTTLES CRITICAL VALUE NOTED.  VALUE IS CONSISTENT WITH PREVIOUSLY REPORTED AND CALLED VALUE.    Culture (A)  Final    ESCHERICHIA COLI SUSCEPTIBILITIES PERFORMED ON PREVIOUS CULTURE WITHIN THE LAST 5 DAYS. Performed at [facility], [place] [address] [place], [place] [address]    Report Status 02/17/2021 FINAL  Final  Resp Panel by RT-PCR (Flu A&B, Covid) Nasopharyngeal Swab      Status: None   Collection Time: 02/13/21 11:00 AM   Specimen: Nasopharyngeal Swab; Nasopharyngeal(NP) swabs in vial transport medium  Result Value Ref Range Status   SARS Coronavirus 2 by  RT PCR NEGATIVE NEGATIVE Final    Comment: (NOTE) SARS-CoV-2 target nucleic acids are NOT DETECTED.  The SARS-CoV-2 RNA is generally detectable in upper respiratory specimens during the acute phase of infection. The lowest concentration of SARS-CoV-2 viral copies this assay can detect is 138 copies/mL. A negative result does not preclude SARS-Cov-2 infection and should not be used as the sole basis for treatment or other patient management decisions. A negative result may occur with  improper specimen collection/handling, submission of specimen other than nasopharyngeal swab, presence of viral mutation(s) within the areas targeted by this assay, and inadequate number of viral copies(<138 copies/mL). A negative result must be combined with clinical observations, patient history, and epidemiological information. The expected result is Negative.  Fact Sheet for Patients:  EntrepreneurPulse.com.au  Fact Sheet for Healthcare Providers:  IncredibleEmployment.be  This test is no t yet approved or cleared by the [place] FDA and  has been authorized for detection and/or diagnosis of SARS-CoV-2 by FDA under an Emergency Use Authorization (EUA). This EUA will remain  in effect (meaning this test can be used) for the duration of the COVID-19 declaration under Section 564(b)(1) of the Act, 21 U.S.C.section 360bbb-3(b)(1), unless the authorization is terminated  or revoked sooner.       Influenza A by PCR NEGATIVE NEGATIVE Final   Influenza B by PCR NEGATIVE NEGATIVE Final    Comment: (NOTE) The Xpert Xpress SARS-CoV-2/FLU/RSV plus assay is intended as an aid in the diagnosis of influenza from Nasopharyngeal swab specimens and should not be used as a sole basis  for treatment. Nasal washings and aspirates are unacceptable for Xpert Xpress SARS-CoV-2/FLU/RSV testing.  Fact Sheet for Patients: EntrepreneurPulse.com.au  Fact Sheet for Healthcare Providers: IncredibleEmployment.be  This test is not yet approved or cleared by the [place] FDA and has been authorized for detection and/or diagnosis of SARS-CoV-2 by FDA under an Emergency Use Authorization (EUA). This EUA will remain in effect (meaning this test can be used) for the duration of the COVID-19 declaration under Section 564(b)(1) of the Act, 21 U.S.C. section 360bbb-3(b)(1), unless the authorization is terminated or revoked.  Performed at [facility], [place] [address], [place], [place] [address]   Blood Culture (routine x 2)     Status: Abnormal   Collection Time: 02/13/21 11:00 AM   Specimen: BLOOD  Result Value Ref Range Status   Specimen Description BLOOD SITE NOT SPECIFIED  Final   Special Requests   Final    BOTTLES DRAWN AEROBIC AND ANAEROBIC Blood Culture results may not be optimal due to an inadequate volume of blood received in culture bottles   Culture  Setup Time   Final    GRAM NEGATIVE RODS IN BOTH AEROBIC AND ANAEROBIC BOTTLES CRITICAL RESULT CALLED TO, READ BACK BY AND VERIFIED WITH: V BRYK,PHARMD@[id number]  02/14/21 [place] Performed at [facility], [address], [place], [place] [address]    Culture ESCHERICHIA COLI (A)  Final   Report Status 02/16/2021 FINAL  Final   Organism ID, Bacteria ESCHERICHIA COLI  Final      Susceptibility   Escherichia coli - MIC*    AMPICILLIN <=2 SENSITIVE Sensitive     CEFAZOLIN <=4 SENSITIVE Sensitive     CEFEPIME <=0.12 SENSITIVE Sensitive     CEFTAZIDIME <=1 SENSITIVE Sensitive     CEFTRIAXONE <=0.25 SENSITIVE Sensitive     CIPROFLOXACIN <=0.25 SENSITIVE Sensitive     GENTAMICIN <=1 SENSITIVE Sensitive     IMIPENEM <=0.25 SENSITIVE Sensitive  TRIMETH/SULFA <=20 SENSITIVE  Sensitive     AMPICILLIN/SULBACTAM <=2 SENSITIVE Sensitive     PIP/TAZO <=4 SENSITIVE Sensitive     * ESCHERICHIA COLI  Blood Culture ID Panel (Reflexed)     Status: Abnormal   Collection Time: 02/13/21 11:00 AM  Result Value Ref Range Status   Enterococcus faecalis NOT DETECTED NOT DETECTED Final   Enterococcus Faecium NOT DETECTED NOT DETECTED Final   Listeria monocytogenes NOT DETECTED NOT DETECTED Final   Staphylococcus species NOT DETECTED NOT DETECTED Final   Staphylococcus aureus (BCID) NOT DETECTED NOT DETECTED Final   Staphylococcus epidermidis NOT DETECTED NOT DETECTED Final   Staphylococcus lugdunensis NOT DETECTED NOT DETECTED Final   Streptococcus species NOT DETECTED NOT DETECTED Final   Streptococcus agalactiae NOT DETECTED NOT DETECTED Final   Streptococcus pneumoniae NOT DETECTED NOT DETECTED Final   Streptococcus pyogenes NOT DETECTED NOT DETECTED Final   A.calcoaceticus-baumannii NOT DETECTED NOT DETECTED Final   Bacteroides fragilis NOT DETECTED NOT DETECTED Final   Enterobacterales DETECTED (A) NOT DETECTED Final    Comment: Enterobacterales represent a large order of gram negative bacteria, not a single organism. CRITICAL RESULT CALLED TO, READ BACK BY AND VERIFIED WITH: V BRYK,PHARMD@[id number]  02/14/21 [place]    Enterobacter cloacae complex NOT DETECTED NOT DETECTED Final   Escherichia coli DETECTED (A) NOT DETECTED Final    Comment: CRITICAL RESULT CALLED TO, READ BACK BY AND VERIFIED WITH: V BRYK,PHARMD@0401  02/14/21 [place]    Klebsiella aerogenes NOT DETECTED NOT DETECTED Final   Klebsiella oxytoca NOT DETECTED NOT DETECTED Final   Klebsiella pneumoniae NOT DETECTED NOT DETECTED Final   Proteus species NOT DETECTED NOT DETECTED Final   Salmonella species NOT DETECTED NOT DETECTED Final   Serratia marcescens NOT DETECTED NOT DETECTED Final   Haemophilus influenzae NOT DETECTED NOT DETECTED Final   Neisseria meningitidis NOT DETECTED NOT DETECTED Final   Pseudomonas  aeruginosa NOT DETECTED NOT DETECTED Final   Stenotrophomonas maltophilia NOT DETECTED NOT DETECTED Final   Candida albicans NOT DETECTED NOT DETECTED Final   Candida auris NOT DETECTED NOT DETECTED Final   Candida glabrata NOT DETECTED NOT DETECTED Final   Candida krusei NOT DETECTED NOT DETECTED Final   Candida parapsilosis NOT DETECTED NOT DETECTED Final   Candida tropicalis NOT DETECTED NOT DETECTED Final   Cryptococcus neoformans/gattii NOT DETECTED NOT DETECTED Final   CTX-M ESBL NOT DETECTED NOT DETECTED Final   Carbapenem resistance IMP NOT DETECTED NOT DETECTED Final   Carbapenem resistance KPC NOT DETECTED NOT DETECTED Final   Carbapenem resistance NDM NOT DETECTED NOT DETECTED Final   Carbapenem resist OXA 48 LIKE NOT DETECTED NOT DETECTED Final   Carbapenem resistance VIM NOT DETECTED NOT DETECTED Final    Comment: Performed at [facility], [address] [place], [place] [address]  Urine Culture     Status: Abnormal   Collection Time: 02/13/21 11:15 AM   Specimen: In/Out Cath Urine  Result Value Ref Range Status   Specimen Description IN/OUT CATH URINE  Final   Special Requests   Final    NONE Performed at [facility], [place] [address] [place], [place] [address]    Culture >=100,000 COLONIES/mL ESCHERICHIA COLI (A)  Final   Report Status 02/15/2021 FINAL  Final   Organism ID, Bacteria ESCHERICHIA COLI (A)  Final      Susceptibility   Escherichia coli - MIC*    AMPICILLIN <=2 SENSITIVE Sensitive     CEFAZOLIN <=4 SENSITIVE Sensitive  CEFEPIME <=0.12 SENSITIVE Sensitive     CEFTRIAXONE <=0.25 SENSITIVE Sensitive     CIPROFLOXACIN <=0.25 SENSITIVE Sensitive     GENTAMICIN <=1 SENSITIVE Sensitive     IMIPENEM <=0.25 SENSITIVE Sensitive     NITROFURANTOIN <=16 SENSITIVE Sensitive     TRIMETH/SULFA <=20 SENSITIVE Sensitive     AMPICILLIN/SULBACTAM <=2 SENSITIVE Sensitive     PIP/TAZO <=4 SENSITIVE Sensitive     * >=100,000 COLONIES/mL ESCHERICHIA COLI   MRSA Next Gen by PCR, Nasal     Status: None   Collection Time: 02/14/21  3:29 PM   Specimen: Nasal Mucosa; Nasal Swab  Result Value Ref Range Status   MRSA by PCR Next Gen NOT DETECTED NOT DETECTED Final    Comment: (NOTE) The GeneXpert MRSA Assay (FDA approved for NASAL specimens only), is one component of a comprehensive MRSA colonization surveillance program. It is not intended to diagnose MRSA infection nor to guide or monitor treatment for MRSA infections. Test performance is not FDA approved in patients less than 19 years old. Performed at [facility], [place] [address], [place], [place] [address]   C Difficile Quick Screen w PCR reflex     Status: Abnormal   Collection Time: 02/14/21  9:04 PM   Specimen: STOOL  Result Value Ref Range Status   C Diff antigen POSITIVE (A) NEGATIVE Final   C Diff toxin NEGATIVE NEGATIVE Final   C Diff interpretation Results are indeterminate. See PCR results.  Final    Comment: Performed at [facility], [place] [address] [place], [place] [address]  C. Diff by PCR, Reflexed     Status: Abnormal   Collection Time: 02/14/21  9:04 PM  Result Value Ref Range Status   Toxigenic C. Difficile by PCR POSITIVE (A) NEGATIVE Final    Comment: Positive for toxigenic C. difficile with little to no toxin production. Only treat if clinical presentation suggests symptomatic illness. Performed at [facility], [place] [address], [place], [place] [address]      Radiology Studies: No results found.  Scheduled Meds:  amLODipine  5 mg Oral Daily   aspirin EC  81 mg Oral QPM   busPIRone  7.5 mg Oral BID   carvedilol  12.5 mg Oral BID   cefadroxil  1,000 mg Oral BID   feeding supplement  237 mL Oral TID with meals   gabapentin  100 mg Oral TID   Gerhardt's butt cream   Topical TID   mouth rinse  15 mL Mouth Rinse BID   multivitamin with minerals  1 tablet Oral Daily   ticagrelor  90 mg Oral BID   vancomycin  125 mg Oral QID   Continuous  Infusions:  sodium chloride 100 mL/hr at 02/19/21 0501   promethazine (PHENERGAN) injection (IM or IVPB) 12.5 mg (02/18/21 0010)      LOS: 6 days   Shelly Coss, MD [facility] P2/13/2023, 11:35 AM

## 2021-02-20 LAB — CBC WITH DIFFERENTIAL/PLATELET
Abs Immature Granulocytes: 0 10*3/uL (ref 0.00–0.07)
Basophils Absolute: 0 10*3/uL (ref 0.0–0.1)
Basophils Relative: 0 %
Eosinophils Absolute: 0 10*3/uL (ref 0.0–0.5)
Eosinophils Relative: 0 %
HCT: 33.5 % — ABNORMAL LOW (ref 36.0–46.0)
Hemoglobin: 10.9 g/dL — ABNORMAL LOW (ref 12.0–15.0)
Lymphocytes Relative: 5 %
Lymphs Abs: 0.4 10*3/uL — ABNORMAL LOW (ref 0.7–4.0)
MCH: 33.6 pg (ref 26.0–34.0)
MCHC: 32.5 g/dL (ref 30.0–36.0)
MCV: 103.4 fL — ABNORMAL HIGH (ref 80.0–100.0)
Monocytes Absolute: 0.3 10*3/uL (ref 0.1–1.0)
Monocytes Relative: 4 %
Neutro Abs: 7.3 10*3/uL (ref 1.7–7.7)
Neutrophils Relative %: 91 %
Platelets: 129 10*3/uL — ABNORMAL LOW (ref 150–400)
RBC: 3.24 MIL/uL — ABNORMAL LOW (ref 3.87–5.11)
RDW: 14.1 % (ref 11.5–15.5)
WBC: 8 10*3/uL (ref 4.0–10.5)
nRBC: 0 % (ref 0.0–0.2)
nRBC: 0 /100 WBC

## 2021-02-20 LAB — BASIC METABOLIC PANEL
Anion gap: 8 (ref 5–15)
BUN: 12 mg/dL (ref 8–23)
CO2: 26 mmol/L (ref 22–32)
Calcium: 9.1 mg/dL (ref 8.9–10.3)
Chloride: 104 mmol/L (ref 98–111)
Creatinine, Ser: 0.66 mg/dL (ref 0.44–1.00)
GFR, Estimated: >60 mL/min (ref >60)
Glucose, Bld: 169 mg/dL — ABNORMAL HIGH (ref 70–99)
Potassium: 3.5 mmol/L (ref 3.5–5.1)
Sodium: 138 mmol/L (ref 135–145)

## 2021-02-20 MED ORDER — AMLODIPINE BESYLATE 5 MG PO TABS: 5.0000 mg | ORAL_TABLET | Freq: Every day | ORAL | Status: DC

## 2021-02-20 MED ORDER — METHOCARBAMOL 500 MG PO TABS: 500.0000 mg | ORAL_TABLET | Freq: Three times a day (TID) | ORAL | Status: DC | PRN

## 2021-02-20 MED ORDER — LOPERAMIDE HCL 2 MG PO CAPS: 2.0000 mg | ORAL_CAPSULE | Freq: Four times a day (QID) | ORAL | 0 refills | Status: DC | PRN

## 2021-02-20 MED ORDER — ONDANSETRON HCL 4 MG PO TABS: 4.0000 mg | ORAL_TABLET | Freq: Every day | ORAL | 1 refills | Status: AC | PRN

## 2021-02-20 MED ORDER — GABAPENTIN 100 MG PO CAPS: 100.0000 mg | ORAL_CAPSULE | Freq: Three times a day (TID) | ORAL | Status: DC

## 2021-02-20 MED ORDER — VANCOMYCIN HCL 125 MG PO CAPS: 125.0000 mg | ORAL_CAPSULE | Freq: Four times a day (QID) | ORAL | 0 refills | Status: AC

## 2021-02-20 NOTE — Progress Notes (Signed)
Occupational Therapy Treatment Patient Details Name: Andrea Kirk MRN: [id number] DOB: April 28, 1940 Today's Date: 02/20/2021   History of present illness Katlyne L Kloeppel is a 81 y.o. female admitted 02/13/21 from SNF ([facility]) due to altered mental status. CT negative for acute abnormality. Pt has been having watery frequent stools since arrival to ED. PMH includes CVA, carotid artery disease with CABG, diastolic CHF, K1SW, HTN, HLD, iron deficiency anemia, depression CKD stage 2   OT comments  Patient received in bed and agreeable to OT session.  Patient was min assist to get to EOB and transferred to recliner with rollator walker and min assist. Patient had soiled bed and stood for cleaning and assisted with peri area cleaning while seated. Patient performed grooming tasks seated with verbal cues. Patient remained in recliner. Acute OT to continue to follow.    Recommendations for follow up therapy are one component of a multi-disciplinary discharge planning process, led by the attending physician.  Recommendations may be updated based on patient status, additional functional criteria and insurance authorization.    Follow Up Recommendations  Skilled nursing-short term rehab (<3 hours/day)    Assistance Recommended at Discharge Frequent or constant Supervision/Assistance  Patient can return home with the following      Equipment Recommendations  Other (comment) (TBD)    Recommendations for Other Services      Precautions / Restrictions         Mobility Bed Mobility Overal bed mobility: Needs Assistance Bed Mobility: Supine to Sit     Supine to sit: Min assist     General bed mobility comments: verbal cues and min assist to scoot to EOB    Transfers Overall transfer level: Needs assistance Equipment used: Rollator (4 wheels) Transfers: Sit to/from Stand, Bed to chair/wheelchair/BSC Sit to Stand: Min assist     Step pivot transfers: Min assist     General transfer  comment: transfer from EOB to recliner with rollator and verbal cues for safety and use of brakes     Balance Overall balance assessment: Needs assistance Sitting-balance support: No upper extremity supported, Feet supported Sitting balance-Leahy Scale: Fair Sitting balance - Comments: able to maintain balance while donning socks   Standing balance support: Bilateral upper extremity supported Standing balance-Leahy Scale: Poor Standing balance comment: reliant on BUE support                           ADL either performed or assessed with clinical judgement   ADL Overall ADL's : Needs assistance/impaired     Grooming: Wash/dry hands;Wash/dry face;Brushing hair;Set up;Sitting Grooming Details (indicate cue type and reason): performed seated in recliner     Lower Body Bathing: Moderate assistance;Sit to/from stand Lower Body Bathing Details (indicate cue type and reason): able to assist with bathing peri area while sitting, max assist for bottom when standing     Lower Body Dressing: Moderate assistance;Sitting/lateral leans Lower Body Dressing Details (indicate cue type and reason): mod assist to donn sock seated on EOB               General ADL Comments: Patient improved with participation with self care tasks    Extremity/Trunk Assessment              Vision       Perception     Praxis      Cognition Arousal/Alertness: Awake/alert Behavior During Therapy: Flat affect Overall Cognitive Status: Impaired/Different from baseline Area of Impairment:  Orientation, Memory                       Following Commands: Follows one step commands consistently, Follows one step commands with increased time     Problem Solving: Decreased initiation, Requires verbal cues, Requires tactile cues General Comments: followed commands with increased time        Exercises      Shoulder Instructions       General Comments      Pertinent Vitals/  Pain       Pain Assessment Pain Assessment: Faces Faces Pain Scale: Hurts little more Pain Location: right knee Pain Descriptors / Indicators: Aching, Grimacing Pain Intervention(s): Limited activity within patient's tolerance, Monitored during session, Repositioned  Home Living                                          Prior Functioning/Environment              Frequency  Min 2X/week        Progress Toward Goals  OT Goals(current goals can now be found in the care plan section)  Progress towards OT goals: Progressing toward goals  Acute Rehab OT Goals Patient Stated Goal: get better OT Goal Formulation: With patient Time For Goal Achievement: 02/28/21 Potential to Achieve Goals: Fair ADL Goals Pt Will Perform Grooming: with set-up;sitting Pt Will Perform Upper Body Dressing: with set-up;sitting Pt Will Perform Lower Body Dressing: with min guard assist;sit to/from stand Pt Will Transfer to Toilet: with min assist;stand pivot transfer Pt Will Perform Toileting - Clothing Manipulation and hygiene: with min assist;sit to/from stand  Plan Discharge plan remains appropriate    Co-evaluation                 AM-PAC OT "6 Clicks" Daily Activity     Outcome Measure   Help from another person eating meals?: A Little Help from another person taking care of personal grooming?: A Little Help from another person toileting, which includes using toliet, bedpan, or urinal?: A Lot Help from another person bathing (including washing, rinsing, drying)?: A Lot Help from another person to put on and taking off regular upper body clothing?: A Lot Help from another person to put on and taking off regular lower body clothing?: A Lot 6 Click Score: 14    End of Session Equipment Utilized During Treatment: Gait belt;Rollator (4 wheels)  OT Visit Diagnosis: Unsteadiness on feet (R26.81)   Activity Tolerance Patient tolerated treatment well   Patient Left in  chair;with call bell/phone within reach;with chair alarm set   Nurse Communication Mobility status        Time: [phone] OT Time Calculation (min): 25 min  Charges: OT General Charges $OT Visit: 1 Visit OT Treatments $Self Care/Home Management : 23-37 mins  Lodema Hong, [place]  Pager [phone] Office [place] 02/20/2021, 11:59 AM

## 2021-02-20 NOTE — Discharge Summary (Signed)
Physician Discharge Summary  Andrea Kirk TDV:[id number] DOB: 01/06/1941 DOA: 02/13/2021  PCP: Aura Dials, MD  Admit date: 02/13/2021 Discharge date: 02/20/2021  Admitted From: SNF Disposition:  SNF  Discharge Condition:Stable CODE STATUS:FULL Diet recommendation: Heart Healthy   Brief/Interim Summary:  Patient is a 81 year old female with history of CVA, coronary artery disease status post CABG, diastolic CHF, diabetes type 2, hypertension, hyperlipidemia, iron deficiency anemia, depression, CKD stage II who presented from skilled nursing facility due to altered mental status.  On presentation she was febrile, hypertensive, hypoxic on room air.  Lab work showed creatinine of 2, UA showed elevated leukocytes.  Chest x-ray showed mild bibasilar atelectasis or pneumonia, vascular congestion, small right pleural effusion.  Code sepsis was initiated in the emergency department and was given IV fluids.  Lab work also showed elevated troponin, cardiology consulted.  Blood cultures showed E. coli with source likely the urine.  She was having frequent diarrhea, C. difficile came out to positive.  Started on vancomycin.  Overall status including mental status have significantly improved.  Plan is to discharge back to skilled nursing facility today.  Following problems were addressed during her hospitalization:  Acute encephalopathy Mental status has improved -Metabolic encephalopathy secondary to sepsis -Head CT did not show any acute intracranial abnormalities. --Currently she is alert and oriented   Gram-negative bacteremia Urine culture and blood cultures showing gram-negative rods: E. coli.  She was on ceftriaxone 2 g daily.Cx showed  pansensitive E. Coli.  Antibiotics changed to cefadroxil for total of 7 days course,completed the course.   Severe sepsis ([place]) Initially febrile and hypotensive. Currently sepsis physiology has resolved .      Elevated troponin Elevated troponin.  Jumped  from 394 to 637.  No report of chest pain.  Could be from supply demand ischemia from sepsis. cardiology was consulted, no plan for any further work-up.  Echocardiogram as documented   Acute renal failure superimposed on stage 2 chronic kidney disease ([place])- (present on admission) AKI has resolved   Essential hypertension- (present on admission) Restarted home Coreg Added amlodipine   Acute respiratory failure with hypoxia ([place])- (present on admission) As on her hospital discharge in 04/2020 after a stroke she was discharged on home oxygen 2L  Currently on 2 L of oxygen per minute intermittently   Chronic diastolic CHF (congestive heart failure) ([place])- (present on admission) -She was initially started on IV fluids now stopped.  Elevated BNP.  Chest x-ray showed vascular congestion -Echo done during this hospitalization showed EF of 50-55% with grade 3 diastolic dysfunction, Severely elevated pulmonary artery pressure. -intake/output -On as needed lasix .   Thrombocytopenia ([place])- (present on admission) Unclear etiology but most likely secondary to bacteremia/sepsis. Improved   Iron deficiency anemia- (present on admission) There was report of passage of dark stool.  FOBT was positive.  On checking her previous lab works, her hemoglobin is in the range of 10.  Currently hemoglobin at baseline.  No further work-up planned as patient has C. difficile  and Hb is stable   HLD (hyperlipidemia)- (present on admission) Not on statin    Controlled type 2 diabetes mellitus with hyperglycemia ([place])- (present on admission) Takes  Glipizide   hx of CVA (cerebral vascular accident) ([place])- (present on admission) Head CT with no acute finding today  Takes ASA/brillinta ,continue    Carotid artery disease ([place])- (present on admission) On aspirin and Brilinta    Nausea Hospital course remarkable for poor oral intake, nausea..  She denies  any abdominal pain or vomiting.  Her abdomen is benign.She  was treated with antiemetics, IV fluids. nausea better today   C. difficile colitis Patient was having several loose bowel movements. Abdominal/pelvis CT did not show any bowel inflammation.  C. difficile antigen came out to be positive without toxin.  Since patient was symptomatic with the diarrhea and abdominal pain, we started treatment . FOBT was found to be positive.  Hemoglobin is stable, so will not pursue any further work-up. Continue vancomycin for 7 more days , added Imodium, for diarrhoea   Anxiety and depression On buspar/celexa We will resume on DC    Discharge Diagnoses:  Principal Problem:   Acute encephalopathy Active Problems:   Gram-negative bacteremia   Chronic diastolic CHF (congestive heart failure) (HCC)   Acute respiratory failure with hypoxia (H[facility])   Essential hypertension   Acute renal failure superimposed on stage 2 chronic kidney disease (H[facility])   Elevated troponin   Severe sepsis (H[facility])   Carotid artery disease (HCC)   hx of CVA (cerebral vascular accident) ([place])   Controlled type 2 diabetes mellitus with hyperglycemia (HCC)   HLD (hyperlipidemia)   Iron deficiency anemia   Thrombocytopenia (HCC)   Anxiety and depression   C. difficile colitis   Nausea    Discharge Instructions  Discharge Instructions     Diet - low sodium heart healthy   Complete by: As directed    Discharge instructions   Complete by: As directed    1)Please take prescribed medications as instructed 2)Do  a CBC and BMP tests in a week 3)Follow up with your PCP   Increase activity slowly   Complete by: As directed       Allergies as of 02/20/2021       Reactions   Codeine Other (See Comments)   Abnormal behavior   Latex Rash, Other (See Comments)   tears skin   Morphine And Related Other (See Comments)   Affects BP and blood sugar.   Cyclobenzaprine    MADE PT SICK- pt unsure if med was cyclobenzaprine or methocarbamol    Methocarbamol Other (See Comments)   MADE  PT SICK- pt unsure if med was cyclobenzaprine or methocarbamol  11/09/20 pt currently takes methocarbamol        Medication List     STOP taking these medications    polyethylene glycol powder 17 GM/SCOOP powder Commonly known as: GLYCOLAX/MIRALAX       TAKE these medications    acetaminophen 325 MG tablet Commonly known as: TYLENOL Take 650 mg by mouth every 6 (six) hours as needed for moderate pain.   amLODipine 5 MG tablet Commonly known as: NORVASC Take 1 tablet (5 mg total) by mouth daily.   aspirin EC 81 MG tablet Take 81 mg by mouth every evening.   B-12 1000 MCG Tabs Take 1,000 mcg by mouth daily.   Biofreeze 4 % Gel Generic drug: Menthol (Topical Analgesic) Apply 1 application topically 3 (three) times daily as needed (bilateral leg pain).   busPIRone 7.5 MG tablet Commonly known as: BUSPAR Take 7.5 mg by mouth 2 (two) times daily.   carvedilol 12.5 MG tablet Commonly known as: COREG Take 1 tablet (12.5 mg total) by mouth 2 (two) times daily.   citalopram 20 MG tablet Commonly known as: CELEXA Take 20 mg by mouth daily.   docusate sodium 100 MG capsule Commonly known as: COLACE Take 100 mg by mouth 2 (two) times daily.   ferrous sulfate 325 (  65 FE) MG tablet Take 325 mg by mouth daily.   furosemide 20 MG tablet Commonly known as: LASIX Take 20 mg by mouth daily as needed for edema.   gabapentin 100 MG capsule Commonly known as: NEURONTIN Take 1 capsule (100 mg total) by mouth 3 (three) times daily. What changed:  medication strength how much to take when to take this   glipiZIDE 5 MG 24 hr tablet Commonly known as: GLUCOTROL XL Take 1 tablet (5 mg total) by mouth daily with breakfast.   HYDROcodone-acetaminophen 5-325 MG tablet Commonly known as: NORCO/VICODIN Take 1 tablet by mouth daily as needed (breakthrough pain). Also takes 10/325mg  twice daily scheduled What changed: Another medication with the same name was removed. Continue  taking this medication, and follow the directions you see here.   lidocaine 5 % Commonly known as: LIDODERM Place 1 patch onto the skin daily. Apply to back and left leg for 12 hours per day for pain. Remove every morning.   loperamide 2 MG capsule Commonly known as: IMODIUM Take 1 capsule (2 mg total) by mouth every 6 (six) hours as needed for diarrhea or loose stools.   Magnesium Oxide 250 MG Tabs Take 250 mg by mouth daily.   meclizine 25 MG tablet Commonly known as: ANTIVERT Take 25 mg by mouth 2 (two) times daily as needed for dizziness. For dizziness   melatonin 3 MG Tabs tablet Take 3 mg by mouth at bedtime.   methocarbamol 500 MG tablet Commonly known as: ROBAXIN Take 1 tablet (500 mg total) by mouth every 8 (eight) hours as needed for muscle spasms. What changed:  when to take this reasons to take this   nitroGLYCERIN 0.4 MG SL tablet Commonly known as: NITROSTAT Place 1 tablet (0.4 mg total) under the tongue every 5 (five) minutes as needed. Chest pain. What changed:  reasons to take this additional instructions   nystatin powder Commonly known as: MYCOSTATIN/NYSTOP Apply 1 application topically 2 (two) times daily as needed (rash).   ondansetron 4 MG tablet Commonly known as: Zofran Take 1 tablet (4 mg total) by mouth daily as needed for nausea or vomiting.   oxybutynin 5 MG 24 hr tablet Commonly known as: DITROPAN-XL Take 5 mg by mouth daily.   saccharomyces boulardii 250 MG capsule Commonly known as: FLORASTOR Take 250 mg by mouth daily.   Systane 0.4-0.3 % Soln Generic drug: Polyethyl Glycol-Propyl Glycol Apply 1-2 drops to eye 4 (four) times daily as needed (dry eye).   ticagrelor 90 MG Tabs tablet Commonly known as: BRILINTA Take 1 tablet (90 mg total) by mouth 2 (two) times daily.   vancomycin 125 MG capsule Commonly known as: VANCOCIN Take 1 capsule (125 mg total) by mouth 4 (four) times daily for 7 days.   vitamin C 1000 MG tablet Take  1,000 mg by mouth daily.        Contact information for follow-up providers     Aura Dials, MD. Schedule an appointment as soon as possible for a visit in 1 week(s).   Specialty: Family Medicine Contact information: [place] [address] [phone]         Jettie Booze, MD .   Specialties: Cardiology, Radiology, Interventional Cardiology Contact information: [address] [address] [phone]              Contact information for after-discharge care     Destination     [facility] Preferred SNF .   Service: Skilled Nursing Contact information: [place] [phone]                    Allergies  Allergen Reactions   Codeine Other (See Comments)    Abnormal behavior    Latex Rash and Other (See Comments)    tears skin    Morphine And Related Other (See Comments)    Affects BP and blood sugar.   Cyclobenzaprine     MADE PT SICK- pt unsure if med was cyclobenzaprine or methocarbamol    Methocarbamol Other (See Comments)    MADE PT SICK- pt unsure if med was cyclobenzaprine or methocarbamol  11/09/20 pt currently takes methocarbamol    Consultations:    Procedures/Studies: CT ABDOMEN PELVIS WO CONTRAST  Result Date: 02/14/2021 CLINICAL DATA:  Acute nonlocalized abdominal pain EXAM: CT ABDOMEN AND PELVIS WITHOUT CONTRAST TECHNIQUE: Multidetector CT imaging of the abdomen and pelvis was performed following the standard protocol without IV contrast. RADIATION DOSE REDUCTION: This exam was performed according to the departmental dose-optimization program which includes automated exposure control, adjustment of the mA and/or kV according to patient size and/or use of iterative reconstruction technique. COMPARISON:  07/21/2014 FINDINGS: Lower chest: Small right pleural effusion with basilar atelectasis or consolidation. Mild  atelectasis in the left base. Small esophageal hiatal hernia. Esophageal wall is thickened suggesting reflux disease. Cardiac enlargement with coronary artery calcifications. Postoperative changes in the mediastinum. Hepatobiliary: No focal liver abnormality is seen. No gallstones, gallbladder wall thickening, or biliary dilatation. Pancreas: Unremarkable. No pancreatic ductal dilatation or surrounding inflammatory changes. Spleen: Normal in size without focal abnormality. Adrenals/Urinary Tract: No adrenal gland nodules. Tiny stones in the left kidney measuring less than 2 mm diameter. No hydronephrosis or hydroureter. No ureteral stones or bladder stones. No bladder wall thickening. Stomach/Bowel: Stomach, small bowel, and colon are not abnormally distended. Scattered colonic diverticula without evidence of acute diverticulitis. Appendix is not identified. Vascular/Lymphatic: Aortic atherosclerosis. No enlarged abdominal or pelvic lymph nodes. Reproductive: Uterus and bilateral adnexa are unremarkable. Other: Small amount of free fluid in the pelvis and pericolic gutters with stranding in the pararenal retroperitoneal fat and in the mesentery. Changes may be inflammatory or edema. Consider possibility of pyelonephritis. Musculoskeletal: Degenerative changes in the spine. Postoperative fixation at L4-5. IMPRESSION: 1. Small right pleural effusion. Basilar atelectasis or consolidation in both lung bases, greater on the right. 2. Small esophageal hiatal hernia with esophageal wall thickening suggesting reflux disease. 3. Nonobstructing intrarenal stones in the left kidney. 4. Diffuse edema and stranding in the pararenal fat and along the pericolic gutters into the pelvis. Slight ascites. Consider edema or inflammatory process such is pyelonephritis. 5. No evidence of bowel obstruction or inflammation. 6. Aortic atherosclerosis. Electronically Signed   By: Lucienne Capers M.D.   On: 02/14/2021 21:49   CT HEAD WO  CONTRAST (5MM)  Result Date: 02/13/2021 CLINICAL DATA:  Altered mental status. EXAM: CT HEAD WITHOUT CONTRAST TECHNIQUE: Contiguous axial images were obtained from the base of the skull through the vertex without intravenous contrast. RADIATION DOSE REDUCTION: This exam was performed according to the departmental dose-optimization program which includes automated exposure control, adjustment of the mA and/or kV according to patient size and/or use of iterative reconstruction technique. COMPARISON:  CT head dated November 09, 2020. FINDINGS: Brain: No evidence of acute infarction, hemorrhage, hydrocephalus, extra-axial collection or mass lesion/mass effect. Stable mild chronic microvascular ischemic changes. Vascular: Calcified atherosclerosis at  the skull base. No hyperdense vessel. Skull: Negative for fracture or focal lesion. Sinuses/Orbits: No acute finding. Other: None. IMPRESSION: 1. No acute intracranial abnormality. Electronically Signed   By: Titus Dubin M.D.   On: 02/13/2021 15:31   DG CHEST PORT 1 VIEW  Result Date: 02/13/2021 CLINICAL DATA:  Altered mental status EXAM: PORTABLE CHEST 1 VIEW COMPARISON:  02/13/2021 FINDINGS: Prior CABG. Cardiomegaly with vascular congestion. Interstitial prominence may reflect interstitial edema. No effusions or acute bony abnormality. IMPRESSION: Cardiomegaly with vascular congestion and probable early interstitial edema. Electronically Signed   By: Rolm Baptise M.D.   On: 02/13/2021 22:02   DG Chest Port 1 View  Result Date: 02/13/2021 CLINICAL DATA:  Question sepsis EXAM: PORTABLE CHEST 1 VIEW COMPARISON:  06/23/2020 FINDINGS: Postop CABG. Heart size upper normal. Mild vascular congestion without edema. Mild patchy bibasilar airspace disease may be atelectasis or pneumonia. Small right pleural effusion. IMPRESSION: Mild bibasilar atelectasis or pneumonia. Vascular congestion without edema.  Small right effusion. Electronically Signed   By: Franchot Gallo M.D.    On: 02/13/2021 11:28   ECHOCARDIOGRAM COMPLETE  Result Date: 02/15/2021    ECHOCARDIOGRAM REPORT   Patient Name:   Andrea Kirk Date of Exam: 02/15/2021 Medical Rec #:  [id number]     Height:       65.0 in Accession #:    [id number]    Weight:       168.2 lb Date of Birth:  1940-02-23      BSA:          1.838 m Patient Age:    75 years      BP:           149/73 mmHg Patient Gender: F             HR:           69 bpm. Exam Location:  Inpatient Procedure: 2D Echo Indications:    CHF  History:        Patient has prior history of Echocardiogram examinations, most                 recent 04/16/2020. CAD; Risk Factors:Hypertension, Diabetes and                 Dyslipidemia.  Sonographer:    Arlyss Gandy Referring Phys: [place]  1. Left ventricular ejection fraction, by estimation, is 50 to 55%. The left ventricle has low normal function. The left ventricle has no regional wall motion abnormalities. There is mild concentric left ventricular hypertrophy. Left ventricular diastolic parameters are consistent with Grade III diastolic dysfunction (restrictive). Elevated left ventricular end-diastolic pressure.  2. Right ventricular systolic function is normal. The right ventricular size is mildly enlarged. There is severely elevated pulmonary artery systolic pressure. The estimated right ventricular systolic pressure is 29.7 mmHg.  3. Left atrial size was severely dilated.  4. Right atrial size was moderately dilated.  5. The mitral valve is grossly normal. Mild mitral valve regurgitation. No evidence of mitral stenosis. Moderate mitral annular calcification.  6. Tricuspid valve regurgitation is moderate.  7. The aortic valve is tricuspid. There is mild calcification of the aortic valve. There is mild thickening of the aortic valve. Aortic valve regurgitation is not visualized. Aortic valve sclerosis/calcification is present, without any evidence of aortic stenosis.  8. The inferior vena cava is  normal in size with <50% respiratory variability, suggesting right atrial pressure of 8 mmHg. Comparison(s): No significant change  from prior study. Prior images reviewed side by side. Conclusion(s)/Recommendation(s): Otherwise normal echocardiogram, with minor abnormalities described in the report. Severely elevated right sided pressures, restrictive LV filling (grade 3 diastolic dysfunction). FINDINGS  Left Ventricle: Left ventricular ejection fraction, by estimation, is 50 to 55%. The left ventricle has low normal function. The left ventricle has no regional wall motion abnormalities. The left ventricular internal cavity size was normal in size. There is mild concentric left ventricular hypertrophy. Left ventricular diastolic parameters are consistent with Grade III diastolic dysfunction (restrictive). Elevated left ventricular end-diastolic pressure. Right Ventricle: The right ventricular size is mildly enlarged. Right vetricular wall thickness was not well visualized. Right ventricular systolic function is normal. There is severely elevated pulmonary artery systolic pressure. The tricuspid regurgitant velocity is 3.61 m/s, and with an assumed right atrial pressure of 8 mmHg, the estimated right ventricular systolic pressure is 16.1 mmHg. Left Atrium: Left atrial size was severely dilated. Right Atrium: Right atrial size was moderately dilated. Pericardium: There is no evidence of pericardial effusion. Mitral Valve: The mitral valve is grossly normal. There is mild thickening of the mitral valve leaflet(s). There is mild calcification of the mitral valve leaflet(s). Moderate mitral annular calcification. Mild mitral valve regurgitation. No evidence of mitral valve stenosis. MV peak gradient, 10.9 mmHg. The mean mitral valve gradient is 3.0 mmHg. Tricuspid Valve: The tricuspid valve is normal in structure. Tricuspid valve regurgitation is moderate . No evidence of tricuspid stenosis. Aortic Valve: The aortic  valve is tricuspid. There is mild calcification of the aortic valve. There is mild thickening of the aortic valve. Aortic valve regurgitation is not visualized. Aortic valve sclerosis/calcification is present, without any evidence of aortic stenosis. Aortic valve mean gradient measures 9.0 mmHg. Aortic valve peak gradient measures 15.7 mmHg. Aortic valve area, by VTI measures 2.22 cm. Pulmonic Valve: The pulmonic valve was not well visualized. Pulmonic valve regurgitation is mild. No evidence of pulmonic stenosis. Aorta: The aortic root, ascending aorta and aortic arch are all structurally normal, with no evidence of dilitation or obstruction. Venous: The inferior vena cava is normal in size with less than 50% respiratory variability, suggesting right atrial pressure of 8 mmHg. IAS/Shunts: The atrial septum is grossly normal.  LEFT VENTRICLE PLAX 2D LVIDd:         4.40 cm   Diastology LVIDs:         3.10 cm   LV e' medial:    7.72 cm/s LV PW:         1.10 cm   LV E/e' medial:  17.6 LV IVS:        1.10 cm   LV e' lateral:   10.80 cm/s LVOT diam:     2.00 cm   LV E/e' lateral: 12.6 LV SV:         95 LV SV Index:   51 LVOT Area:     3.14 cm  RIGHT VENTRICLE            IVC RV Basal diam:  4.20 cm    IVC diam: 2.00 cm RV Mid diam:    3.20 cm RV S prime:     8.16 cm/s TAPSE (M-mode): 1.4 cm LEFT ATRIUM              Index        RIGHT ATRIUM           Index LA diam:        4.10 cm  2.23 cm/m  RA Area:     22.60 cm LA Vol (A2C):   108.0 ml 58.76 ml/m  RA Volume:   66.90 ml  36.40 ml/m LA Vol (A4C):   77.4 ml  42.11 ml/m LA Biplane Vol: 91.6 ml  49.84 ml/m  AORTIC VALVE AV Area (Vmax):    2.43 cm AV Area (Vmean):   2.23 cm AV Area (VTI):     2.22 cm AV Vmax:           198.00 cm/s AV Vmean:          141.000 cm/s AV VTI:            0.425 m AV Peak Grad:      15.7 mmHg AV Mean Grad:      9.0 mmHg LVOT Vmax:         153.00 cm/s LVOT Vmean:        100.000 cm/s LVOT VTI:          0.301 m LVOT/AV VTI ratio: 0.71  AORTA  Ao Root diam: 3.20 cm Ao Asc diam:  3.40 cm MITRAL VALVE                TRICUSPID VALVE MV Area (PHT): 3.60 cm     TR Peak grad:   52.1 mmHg MV Area VTI:   2.46 cm     TR Vmax:        361.00 cm/s MV Peak grad:  10.9 mmHg MV Mean grad:  3.0 mmHg     SHUNTS MV Vmax:       1.65 m/s     Systemic VTI:  0.30 m MV Vmean:      70.7 cm/s    Systemic Diam: 2.00 cm MV Decel Time: 211 msec MV E velocity: 136.00 cm/s MV A velocity: 44.00 cm/s MV E/A ratio:  3.09 Buford Dresser MD Electronically signed by Buford Dresser MD Signature Date/Time: 02/15/2021/1:03:58 PM    Final       Subjective:  Patient seen and examined at the bedside this morning.  Hemodynamically stable.  Appears much better today, diarrhea has improved.  Denies any abdominal pain.  Hemodynamically stable for discharge.I called her brother and updated about our discharge plan  Discharge Exam: Vitals:   02/20/21 0005 02/20/21 0446  BP: 106/66 139/66  Pulse: 76 81  Resp: 18 16  Temp: 98.6 F (37 C) 98.7 F (37.1 C)  SpO2: 98% 94%   Vitals:   02/19/21 2020 02/20/21 0005 02/20/21 0446 02/20/21 0447  BP: (!) 179/97 106/66 139/66   Pulse: 83 76 81   Resp: 16 18 16    Temp: 98 F (36.7 C) 98.6 F (37 C) 98.7 F (37.1 C)   TempSrc: Oral Oral Oral   SpO2: 96% 98% 94%   Weight:    79.8 kg  Height:        General: Pt is alert, awake, not in acute distress Cardiovascular: RRR, S1/S2 +, no rubs, no gallops Respiratory: CTA bilaterally, no wheezing, no rhonchi Abdominal: Soft, NT, ND, bowel sounds + Extremities: no edema, no cyanosis    The results of significant diagnostics from this hospitalization (including imaging, microbiology, ancillary and laboratory) are listed below for reference.     Microbiology: Recent Results (from the past 240 hour(s))  Blood Culture (routine x 2)     Status: Abnormal   Collection Time: 02/13/21 10:53 AM   Specimen: BLOOD  Result Value Ref Range Status   Specimen Description BLOOD  RIGHT ANTECUBITAL  Final   Special Requests   Final    BOTTLES DRAWN AEROBIC AND ANAEROBIC Blood Culture adequate volume   Culture  Setup Time   Final    GRAM NEGATIVE RODS IN BOTH AEROBIC AND ANAEROBIC BOTTLES CRITICAL VALUE NOTED.  VALUE IS CONSISTENT WITH PREVIOUSLY REPORTED AND CALLED VALUE.    Culture (A)  Final    ESCHERICHIA COLI SUSCEPTIBILITIES PERFORMED ON PREVIOUS CULTURE WITHIN THE LAST 5 DAYS. Performed at [facility], [place] [address], [place], [place] [address]    Report Status 02/17/2021 FINAL  Final  Resp Panel by RT-PCR (Flu A&B, Covid) Nasopharyngeal Swab     Status: None   Collection Time: 02/13/21 11:00 AM   Specimen: Nasopharyngeal Swab; Nasopharyngeal(NP) swabs in vial transport medium  Result Value Ref Range Status   SARS Coronavirus 2 by RT PCR NEGATIVE NEGATIVE Final    Comment: (NOTE) SARS-CoV-2 target nucleic acids are NOT DETECTED.  The SARS-CoV-2 RNA is generally detectable in upper respiratory specimens during the acute phase of infection. The lowest concentration of SARS-CoV-2 viral copies this assay can detect is 138 copies/mL. A negative result does not preclude SARS-Cov-2 infection and should not be used as the sole basis for treatment or other patient management decisions. A negative result may occur with  improper specimen collection/handling, submission of specimen other than nasopharyngeal swab, presence of viral mutation(s) within the areas targeted by this assay, and inadequate number of viral copies(<138 copies/mL). A negative result must be combined with clinical observations, patient history, and epidemiological information. The expected result is Negative.  Fact Sheet for Patients:  EntrepreneurPulse.com.au  Fact Sheet for Healthcare Providers:  IncredibleEmployment.be  This test is no t yet approved or cleared by the [place] FDA and  has been authorized for detection and/or diagnosis  of SARS-CoV-2 by FDA under an Emergency Use Authorization (EUA). This EUA will remain  in effect (meaning this test can be used) for the duration of the COVID-19 declaration under Section 564(b)(1) of the Act, 21 U.S.C.section 360bbb-3(b)(1), unless the authorization is terminated  or revoked sooner.       Influenza A by PCR NEGATIVE NEGATIVE Final   Influenza B by PCR NEGATIVE NEGATIVE Final    Comment: (NOTE) The Xpert Xpress SARS-CoV-2/FLU/RSV plus assay is intended as an aid in the diagnosis of influenza from Nasopharyngeal swab specimens and should not be used as a sole basis for treatment. Nasal washings and aspirates are unacceptable for Xpert Xpress SARS-CoV-2/FLU/RSV testing.  Fact Sheet for Patients: EntrepreneurPulse.com.au  Fact Sheet for Healthcare Providers: IncredibleEmployment.be  This test is not yet approved or cleared by the [place] FDA and has been authorized for detection and/or diagnosis of SARS-CoV-2 by FDA under an Emergency Use Authorization (EUA). This EUA will remain in effect (meaning this test can be used) for the duration of the COVID-19 declaration under Section 564(b)(1) of the Act, 21 U.S.C. section 360bbb-3(b)(1), unless the authorization is terminated or revoked.  Performed at [facility], [place] [address], [place], [place] [address]   Blood Culture (routine x 2)     Status: Abnormal   Collection Time: 02/13/21 11:00 AM   Specimen: BLOOD  Result Value Ref Range Status   Specimen Description BLOOD SITE NOT SPECIFIED  Final   Special Requests   Final    BOTTLES DRAWN AEROBIC AND ANAEROBIC Blood Culture results may not be optimal due to an inadequate volume of blood received in culture bottles   Culture  Setup  Time   Final    GRAM NEGATIVE RODS IN BOTH AEROBIC AND ANAEROBIC BOTTLES CRITICAL RESULT CALLED TO, READ BACK BY AND VERIFIED WITH: V BRYK,PHARMD@[id number]  02/14/21 [place] Performed at [facility], [address], [place],  [address]    Culture ESCHERICHIA COLI (A)  Final   Report Status 02/16/2021 FINAL  Final   Organism ID, Bacteria ESCHERICHIA COLI  Final      Susceptibility   Escherichia coli - MIC*    AMPICILLIN <=2 SENSITIVE Sensitive     CEFAZOLIN <=4 SENSITIVE Sensitive     CEFEPIME <=0.12 SENSITIVE Sensitive     CEFTAZIDIME <=1 SENSITIVE Sensitive     CEFTRIAXONE <=0.25 SENSITIVE Sensitive     CIPROFLOXACIN <=0.25 SENSITIVE Sensitive     GENTAMICIN <=1 SENSITIVE Sensitive     IMIPENEM <=0.25 SENSITIVE Sensitive     TRIMETH/SULFA <=20 SENSITIVE Sensitive     AMPICILLIN/SULBACTAM <=2 SENSITIVE Sensitive     PIP/TAZO <=4 SENSITIVE Sensitive     * ESCHERICHIA COLI  Blood Culture ID Panel (Reflexed)     Status: Abnormal   Collection Time: 02/13/21 11:00 AM  Result Value Ref Range Status   Enterococcus faecalis NOT DETECTED NOT DETECTED Final   Enterococcus Faecium NOT DETECTED NOT DETECTED Final   Listeria monocytogenes NOT DETECTED NOT DETECTED Final   Staphylococcus species NOT DETECTED NOT DETECTED Final   Staphylococcus aureus (BCID) NOT DETECTED NOT DETECTED Final   Staphylococcus epidermidis NOT DETECTED NOT DETECTED Final   Staphylococcus lugdunensis NOT DETECTED NOT DETECTED Final   Streptococcus species NOT DETECTED NOT DETECTED Final   Streptococcus agalactiae NOT DETECTED NOT DETECTED Final   Streptococcus pneumoniae NOT DETECTED NOT DETECTED Final   Streptococcus pyogenes NOT DETECTED NOT DETECTED Final   A.calcoaceticus-baumannii NOT DETECTED NOT DETECTED Final   Bacteroides fragilis NOT DETECTED NOT DETECTED Final   Enterobacterales DETECTED (A) NOT DETECTED Final    Comment: Enterobacterales represent a large order of gram negative bacteria, not a single organism. CRITICAL RESULT CALLED TO, READ BACK BY AND VERIFIED WITH: V BRYK,PHARMD@0401  02/14/21 [place]    Enterobacter cloacae complex NOT DETECTED NOT DETECTED Final   Escherichia coli  DETECTED (A) NOT DETECTED Final    Comment: CRITICAL RESULT CALLED TO, READ BACK BY AND VERIFIED WITH: V BRYK,PHARMD@0401  02/14/21 [place]    Klebsiella aerogenes NOT DETECTED NOT DETECTED Final   Klebsiella oxytoca NOT DETECTED NOT DETECTED Final   Klebsiella pneumoniae NOT DETECTED NOT DETECTED Final   Proteus species NOT DETECTED NOT DETECTED Final   Salmonella species NOT DETECTED NOT DETECTED Final   Serratia marcescens NOT DETECTED NOT DETECTED Final   Haemophilus influenzae NOT DETECTED NOT DETECTED Final   Neisseria meningitidis NOT DETECTED NOT DETECTED Final   Pseudomonas aeruginosa NOT DETECTED NOT DETECTED Final   Stenotrophomonas maltophilia NOT DETECTED NOT DETECTED Final   Candida albicans NOT DETECTED NOT DETECTED Final   Candida auris NOT DETECTED NOT DETECTED Final   Candida glabrata NOT DETECTED NOT DETECTED Final   Candida krusei NOT DETECTED NOT DETECTED Final   Candida parapsilosis NOT DETECTED NOT DETECTED Final   Candida tropicalis NOT DETECTED NOT DETECTED Final   Cryptococcus neoformans/gattii NOT DETECTED NOT DETECTED Final   CTX-M ESBL NOT DETECTED NOT DETECTED Final   Carbapenem resistance IMP NOT DETECTED NOT DETECTED Final   Carbapenem resistance KPC NOT DETECTED NOT DETECTED Final   Carbapenem resistance NDM NOT DETECTED NOT DETECTED Final   Carbapenem resist OXA 48 LIKE NOT DETECTED NOT DETECTED  Final   Carbapenem resistance VIM NOT DETECTED NOT DETECTED Final    Comment: Performed at [facility], [place] [address], [place], [place] [address]  Urine Culture     Status: Abnormal   Collection Time: 02/13/21 11:15 AM   Specimen: In/Out Cath Urine  Result Value Ref Range Status   Specimen Description IN/OUT CATH URINE  Final   Special Requests   Final    NONE Performed at [facility], [place] [address] [place], [place] [address]    Culture >=100,000 COLONIES/mL ESCHERICHIA COLI (A)  Final   Report Status 02/15/2021 FINAL  Final   Organism ID,  Bacteria ESCHERICHIA COLI (A)  Final      Susceptibility   Escherichia coli - MIC*    AMPICILLIN <=2 SENSITIVE Sensitive     CEFAZOLIN <=4 SENSITIVE Sensitive     CEFEPIME <=0.12 SENSITIVE Sensitive     CEFTRIAXONE <=0.25 SENSITIVE Sensitive     CIPROFLOXACIN <=0.25 SENSITIVE Sensitive     GENTAMICIN <=1 SENSITIVE Sensitive     IMIPENEM <=0.25 SENSITIVE Sensitive     NITROFURANTOIN <=16 SENSITIVE Sensitive     TRIMETH/SULFA <=20 SENSITIVE Sensitive     AMPICILLIN/SULBACTAM <=2 SENSITIVE Sensitive     PIP/TAZO <=4 SENSITIVE Sensitive     * >=100,000 COLONIES/mL ESCHERICHIA COLI  MRSA Next Gen by PCR, Nasal     Status: None   Collection Time: 02/14/21  3:29 PM   Specimen: Nasal Mucosa; Nasal Swab  Result Value Ref Range Status   MRSA by PCR Next Gen NOT DETECTED NOT DETECTED Final    Comment: (NOTE) The GeneXpert MRSA Assay (FDA approved for NASAL specimens only), is one component of a comprehensive MRSA colonization surveillance program. It is not intended to diagnose MRSA infection nor to guide or monitor treatment for MRSA infections. Test performance is not FDA approved in patients less than 36 years old. Performed at [facility], [place] [address], [place], [place] [address]   C Difficile Quick Screen w PCR reflex     Status: Abnormal   Collection Time: 02/14/21  9:04 PM   Specimen: STOOL  Result Value Ref Range Status   C Diff antigen POSITIVE (A) NEGATIVE Final   C Diff toxin NEGATIVE NEGATIVE Final   C Diff interpretation Results are indeterminate. See PCR results.  Final    Comment: Performed at [facility], [place] [address] [place], [place] [address]  C. Diff by PCR, Reflexed     Status: Abnormal   Collection Time: 02/14/21  9:04 PM  Result Value Ref Range Status   Toxigenic C. Difficile by PCR POSITIVE (A) NEGATIVE Final    Comment: Positive for toxigenic C. difficile with little to no toxin production. Only treat if clinical presentation suggests  symptomatic illness. Performed at [facility], [place] [address] [place], [place] [address]      Labs: BNP (last 3 results) Recent Labs    02/13/21 2208  BNP 5,883.2*   Basic Metabolic Panel: Recent Labs  Lab 02/13/21 1510 02/13/21 1536 02/16/21 0048 02/17/21 0107 02/18/21 0913 02/19/21 0200 02/20/21 0349  NA  --    < > 139 139 138 139 138  K  --    < > 3.7 3.8 3.4* 3.8 3.5  CL  --    < > 103 103 106 106 104  CO2  --    < > 28 29 24 23 26   GLUCOSE  --    < >  144* 164* 200* 192* 169*  BUN  --    < > 36* 31* 18 13 12   CREATININE  --    < > 0.92 0.85 0.66 0.60 0.66  CALCIUM  --    < > 9.1 9.2 8.8* 9.0 9.1  MG 2.0  --   --   --   --   --   --    < > = values in this interval not displayed.   Liver Function Tests: Recent Labs  Lab 02/13/21 1103  AST 19  ALT 14  ALKPHOS 85  BILITOT 1.0  PROT 5.9*  ALBUMIN 2.8*   No results for input(s): LIPASE, AMYLASE in the last 168 hours. Recent Labs  Lab 02/13/21 1442  AMMONIA 18   CBC: Recent Labs  Lab 02/16/21 0048 02/17/21 0107 02/18/21 0913 02/19/21 0636 02/20/21 0349  WBC 6.0 7.3 9.1 11.8* 8.0  NEUTROABS 4.4 5.5 8.5* 9.4* 7.3  HGB 10.1* 11.1* 10.9* 12.1 10.9*  HCT 32.0* 34.8* 34.3* 36.9 33.5*  MCV 105.6* 105.8* 104.6* 102.8* 103.4*  PLT 59* 67* 98* 131* 129*   Cardiac Enzymes: No results for input(s): CKTOTAL, CKMB, CKMBINDEX, TROPONINI in the last 168 hours. BNP: Invalid input(s): POCBNP CBG: No results for input(s): GLUCAP in the last 168 hours. D-Dimer No results for input(s): DDIMER in the last 72 hours. Hgb A1c No results for input(s): HGBA1C in the last 72 hours. Lipid Profile No results for input(s): CHOL, HDL, LDLCALC, TRIG, CHOLHDL, LDLDIRECT in the last 72 hours. Thyroid function studies No results for input(s): TSH, T4TOTAL, T3FREE, THYROIDAB in the last 72 hours.  Invalid input(s): FREET3 Anemia work up No results for input(s): VITAMINB12, FOLATE, FERRITIN, TIBC, IRON, RETICCTPCT in  the last 72 hours. Urinalysis    Component Value Date/Time   COLORURINE AMBER (A) 02/13/2021 1115   APPEARANCEUR TURBID (A) 02/13/2021 1115   LABSPEC 1.017 02/13/2021 1115   PHURINE 5.0 02/13/2021 1115   GLUCOSEU NEGATIVE 02/13/2021 1115   HGBUR SMALL (A) 02/13/2021 1115   BILIRUBINUR NEGATIVE 02/13/2021 1115   KETONESUR NEGATIVE 02/13/2021 1115   PROTEINUR 100 (A) 02/13/2021 1115   UROBILINOGEN 1.0 06/17/2014 2145   NITRITE NEGATIVE 02/13/2021 1115   LEUKOCYTESUR MODERATE (A) 02/13/2021 1115   Sepsis Labs Invalid input(s): PROCALCITONIN,  WBC,  LACTICIDVEN Microbiology Recent Results (from the past 240 hour(s))  Blood Culture (routine x 2)     Status: Abnormal   Collection Time: 02/13/21 10:53 AM   Specimen: BLOOD  Result Value Ref Range Status   Specimen Description BLOOD RIGHT ANTECUBITAL  Final   Special Requests   Final    BOTTLES DRAWN AEROBIC AND ANAEROBIC Blood Culture adequate volume   Culture  Setup Time   Final    GRAM NEGATIVE RODS IN BOTH AEROBIC AND ANAEROBIC BOTTLES CRITICAL VALUE NOTED.  VALUE IS CONSISTENT WITH PREVIOUSLY REPORTED AND CALLED VALUE.    Culture (A)  Final    ESCHERICHIA COLI SUSCEPTIBILITIES PERFORMED ON PREVIOUS CULTURE WITHIN THE LAST 5 DAYS. Performed at [facility], [place] [address], [place], [place] [address]    Report Status 02/17/2021 FINAL  Final  Resp Panel by RT-PCR (Flu A&B, Covid) Nasopharyngeal Swab     Status: None   Collection Time: 02/13/21 11:00 AM   Specimen: Nasopharyngeal Swab; Nasopharyngeal(NP) swabs in vial transport medium  Result Value Ref Range Status   SARS Coronavirus 2 by RT PCR NEGATIVE NEGATIVE Final    Comment: (NOTE) SARS-CoV-2 target nucleic acids are NOT  DETECTED.  The SARS-CoV-2 RNA is generally detectable in upper respiratory specimens during the acute phase of infection. The lowest concentration of SARS-CoV-2 viral copies this assay can detect is 138 copies/mL. A negative result does not  preclude SARS-Cov-2 infection and should not be used as the sole basis for treatment or other patient management decisions. A negative result may occur with  improper specimen collection/handling, submission of specimen other than nasopharyngeal swab, presence of viral mutation(s) within the areas targeted by this assay, and inadequate number of viral copies(<138 copies/mL). A negative result must be combined with clinical observations, patient history, and epidemiological information. The expected result is Negative.  Fact Sheet for Patients:  EntrepreneurPulse.com.au  Fact Sheet for Healthcare Providers:  IncredibleEmployment.be  This test is no t yet approved or cleared by the [place] FDA and  has been authorized for detection and/or diagnosis of SARS-CoV-2 by FDA under an Emergency Use Authorization (EUA). This EUA will remain  in effect (meaning this test can be used) for the duration of the COVID-19 declaration under Section 564(b)(1) of the Act, 21 U.S.C.section 360bbb-3(b)(1), unless the authorization is terminated  or revoked sooner.       Influenza A by PCR NEGATIVE NEGATIVE Final   Influenza B by PCR NEGATIVE NEGATIVE Final    Comment: (NOTE) The Xpert Xpress SARS-CoV-2/FLU/RSV plus assay is intended as an aid in the diagnosis of influenza from Nasopharyngeal swab specimens and should not be used as a sole basis for treatment. Nasal washings and aspirates are unacceptable for Xpert Xpress SARS-CoV-2/FLU/RSV testing.  Fact Sheet for Patients: EntrepreneurPulse.com.au  Fact Sheet for Healthcare Providers: IncredibleEmployment.be  This test is not yet approved or cleared by the [place] FDA and has been authorized for detection and/or diagnosis of SARS-CoV-2 by FDA under an Emergency Use Authorization (EUA). This EUA will remain in effect (meaning this test can be used) for the  duration of the COVID-19 declaration under Section 564(b)(1) of the Act, 21 U.S.C. section 360bbb-3(b)(1), unless the authorization is terminated or revoked.  Performed at [facility], [place] [address] [place], [place] [address]   Blood Culture (routine x 2)     Status: Abnormal   Collection Time: 02/13/21 11:00 AM   Specimen: BLOOD  Result Value Ref Range Status   Specimen Description BLOOD SITE NOT SPECIFIED  Final   Special Requests   Final    BOTTLES DRAWN AEROBIC AND ANAEROBIC Blood Culture results may not be optimal due to an inadequate volume of blood received in culture bottles   Culture  Setup Time   Final    GRAM NEGATIVE RODS IN BOTH AEROBIC AND ANAEROBIC BOTTLES CRITICAL RESULT CALLED TO, READ BACK BY AND VERIFIED WITH: V BRYK,PHARMD@0401  02/14/21 [place] Performed at [facility], [address] [place], [place] [address]    Culture ESCHERICHIA COLI (A)  Final   Report Status 02/16/2021 FINAL  Final   Organism ID, Bacteria ESCHERICHIA COLI  Final      Susceptibility   Escherichia coli - MIC*    AMPICILLIN <=2 SENSITIVE Sensitive     CEFAZOLIN <=4 SENSITIVE Sensitive     CEFEPIME <=0.12 SENSITIVE Sensitive     CEFTAZIDIME <=1 SENSITIVE Sensitive     CEFTRIAXONE <=0.25 SENSITIVE Sensitive     CIPROFLOXACIN <=0.25 SENSITIVE Sensitive     GENTAMICIN <=1 SENSITIVE Sensitive     IMIPENEM <=0.25 SENSITIVE Sensitive     TRIMETH/SULFA <=20 SENSITIVE Sensitive     AMPICILLIN/SULBACTAM <=2 SENSITIVE  Sensitive     PIP/TAZO <=4 SENSITIVE Sensitive     * ESCHERICHIA COLI  Blood Culture ID Panel (Reflexed)     Status: Abnormal   Collection Time: 02/13/21 11:00 AM  Result Value Ref Range Status   Enterococcus faecalis NOT DETECTED NOT DETECTED Final   Enterococcus Faecium NOT DETECTED NOT DETECTED Final   Listeria monocytogenes NOT DETECTED NOT DETECTED Final   Staphylococcus species NOT DETECTED NOT DETECTED Final   Staphylococcus aureus (BCID) NOT DETECTED NOT  DETECTED Final   Staphylococcus epidermidis NOT DETECTED NOT DETECTED Final   Staphylococcus lugdunensis NOT DETECTED NOT DETECTED Final   Streptococcus species NOT DETECTED NOT DETECTED Final   Streptococcus agalactiae NOT DETECTED NOT DETECTED Final   Streptococcus pneumoniae NOT DETECTED NOT DETECTED Final   Streptococcus pyogenes NOT DETECTED NOT DETECTED Final   A.calcoaceticus-baumannii NOT DETECTED NOT DETECTED Final   Bacteroides fragilis NOT DETECTED NOT DETECTED Final   Enterobacterales DETECTED (A) NOT DETECTED Final    Comment: Enterobacterales represent a large order of gram negative bacteria, not a single organism. CRITICAL RESULT CALLED TO, READ BACK BY AND VERIFIED WITH: V BRYK,PHARMD@0401  02/14/21 [place]    Enterobacter cloacae complex NOT DETECTED NOT DETECTED Final   Escherichia coli DETECTED (A) NOT DETECTED Final    Comment: CRITICAL RESULT CALLED TO, READ BACK BY AND VERIFIED WITH: V BRYK,PHARMD@0401  02/14/21 [place]    Klebsiella aerogenes NOT DETECTED NOT DETECTED Final   Klebsiella oxytoca NOT DETECTED NOT DETECTED Final   Klebsiella pneumoniae NOT DETECTED NOT DETECTED Final   Proteus species NOT DETECTED NOT DETECTED Final   Salmonella species NOT DETECTED NOT DETECTED Final   Serratia marcescens NOT DETECTED NOT DETECTED Final   Haemophilus influenzae NOT DETECTED NOT DETECTED Final   Neisseria meningitidis NOT DETECTED NOT DETECTED Final   Pseudomonas aeruginosa NOT DETECTED NOT DETECTED Final   Stenotrophomonas maltophilia NOT DETECTED NOT DETECTED Final   Candida albicans NOT DETECTED NOT DETECTED Final   Candida auris NOT DETECTED NOT DETECTED Final   Candida glabrata NOT DETECTED NOT DETECTED Final   Candida krusei NOT DETECTED NOT DETECTED Final   Candida parapsilosis NOT DETECTED NOT DETECTED Final   Candida tropicalis NOT DETECTED NOT DETECTED Final   Cryptococcus neoformans/gattii NOT DETECTED NOT DETECTED Final   CTX-M ESBL NOT DETECTED NOT DETECTED  Final   Carbapenem resistance IMP NOT DETECTED NOT DETECTED Final   Carbapenem resistance KPC NOT DETECTED NOT DETECTED Final   Carbapenem resistance NDM NOT DETECTED NOT DETECTED Final   Carbapenem resist OXA 48 LIKE NOT DETECTED NOT DETECTED Final   Carbapenem resistance VIM NOT DETECTED NOT DETECTED Final    Comment: Performed at [facility], [address] [place], [place] [address]  Urine Culture     Status: Abnormal   Collection Time: 02/13/21 11:15 AM   Specimen: In/Out Cath Urine  Result Value Ref Range Status   Specimen Description IN/OUT CATH URINE  Final   Special Requests   Final    NONE Performed at [facility], [place] [address], [place], [place] [address]    Culture >=100,000 COLONIES/mL ESCHERICHIA COLI (A)  Final   Report Status 02/15/2021 FINAL  Final   Organism ID, Bacteria ESCHERICHIA COLI (A)  Final      Susceptibility   Escherichia coli - MIC*    AMPICILLIN <=2 SENSITIVE Sensitive     CEFAZOLIN <=4 SENSITIVE Sensitive     CEFEPIME <=0.12 SENSITIVE Sensitive     CEFTRIAXONE <=0.25  SENSITIVE Sensitive     CIPROFLOXACIN <=0.25 SENSITIVE Sensitive     GENTAMICIN <=1 SENSITIVE Sensitive     IMIPENEM <=0.25 SENSITIVE Sensitive     NITROFURANTOIN <=16 SENSITIVE Sensitive     TRIMETH/SULFA <=20 SENSITIVE Sensitive     AMPICILLIN/SULBACTAM <=2 SENSITIVE Sensitive     PIP/TAZO <=4 SENSITIVE Sensitive     * >=100,000 COLONIES/mL ESCHERICHIA COLI  MRSA Next Gen by PCR, Nasal     Status: None   Collection Time: 02/14/21  3:29 PM   Specimen: Nasal Mucosa; Nasal Swab  Result Value Ref Range Status   MRSA by PCR Next Gen NOT DETECTED NOT DETECTED Final    Comment: (NOTE) The GeneXpert MRSA Assay (FDA approved for NASAL specimens only), is one component of a comprehensive MRSA colonization surveillance program. It is not intended to diagnose MRSA infection nor to guide or monitor treatment for MRSA infections. Test performance is not FDA approved in  patients less than 83 years old. Performed at [facility], [place] [address] [place], [place] [address]   C Difficile Quick Screen w PCR reflex     Status: Abnormal   Collection Time: 02/14/21  9:04 PM   Specimen: STOOL  Result Value Ref Range Status   C Diff antigen POSITIVE (A) NEGATIVE Final   C Diff toxin NEGATIVE NEGATIVE Final   C Diff interpretation Results are indeterminate. See PCR results.  Final    Comment: Performed at [facility], [place] [address], [place], [place] [address]  C. Diff by PCR, Reflexed     Status: Abnormal   Collection Time: 02/14/21  9:04 PM  Result Value Ref Range Status   Toxigenic C. Difficile by PCR POSITIVE (A) NEGATIVE Final    Comment: Positive for toxigenic C. difficile with little to no toxin production. Only treat if clinical presentation suggests symptomatic illness. Performed at [facility], [place] [address] [place],  [address]     Please note: You were cared for by a hospitalist during your hospital stay. Once you are discharged, your primary care physician will handle any further medical issues. Please note that NO REFILLS for any discharge medications will be authorized once you are discharged, as it is imperative that you return to your primary care physician (or establish a relationship with a primary care physician if you do not have one) for your post hospital discharge needs so that they can reassess your need for medications and monitor your lab values.    Time coordinating discharge: 40 minutes  SIGNED:   Shelly Coss, MD  [facility] 02/20/2021, 10:29 AM Pager [phone]  If 7PM-7AM, please contact night-coverage [email or web address] Password [email or web address]

## 2021-02-20 NOTE — TOC Transition Note (Signed)
Transition of Care [facility]) - CM/SW Discharge Note   Patient Details  Name: Dmiyah Liscano Prosise MRN: [id number] Date of Birth: 25-Apr-1940  [facility] CM/SW Contact:  Geralynn Ochs, LCSW Phone Number: 02/20/2021, 11:11 AM   Clinical Narrative:    CSW sent discharge information to [facility], spoke with patient's brother to update on discharge today. Transport arranged with [facility] for pickup next available.  Nurse to call report to [phone], Room 718B.    Final next level of care: Skilled Nursing Facility Barriers to Discharge: Barriers Resolved   Patient Goals and CMS Choice Patient states their goals for this hospitalization and ongoing recovery are:: patient unable to participate in goal setting, not oriented CMS Medicare.gov Compare Post Acute Care list provided to:: Patient Represenative (must comment) Choice offered to / list presented to : Sibling  Discharge Placement              Patient chooses bed at: [facility] Patient to be transferred to facility by: [place] Name of family member notified: Dr. Farve Patient and family notified of of transfer: 02/20/21  Discharge Plan and Services     Post Acute Care Choice: [place]                               Social Determinants of Health (SDOH) Interventions     Readmission Risk Interventions No flowsheet data found.

## 2021-02-20 NOTE — Plan of Care (Signed)

## 2021-02-21 DIAGNOSIS — Z9181 History of falling: Secondary | ICD-10-CM | POA: Diagnosis not present

## 2021-02-21 DIAGNOSIS — E785 Hyperlipidemia, unspecified: Secondary | ICD-10-CM | POA: Diagnosis not present

## 2021-02-21 DIAGNOSIS — Z741 Need for assistance with personal care: Secondary | ICD-10-CM | POA: Diagnosis not present

## 2021-02-21 DIAGNOSIS — J9601 Acute respiratory failure with hypoxia: Secondary | ICD-10-CM | POA: Diagnosis not present

## 2021-02-21 DIAGNOSIS — I2510 Atherosclerotic heart disease of native coronary artery without angina pectoris: Secondary | ICD-10-CM | POA: Diagnosis not present

## 2021-02-21 DIAGNOSIS — I10 Essential (primary) hypertension: Secondary | ICD-10-CM | POA: Diagnosis not present

## 2021-02-21 DIAGNOSIS — F32A Depression, unspecified: Secondary | ICD-10-CM | POA: Diagnosis not present

## 2021-02-21 DIAGNOSIS — E119 Type 2 diabetes mellitus without complications: Secondary | ICD-10-CM | POA: Diagnosis not present

## 2021-02-21 DIAGNOSIS — I5032 Chronic diastolic (congestive) heart failure: Secondary | ICD-10-CM | POA: Diagnosis not present

## 2021-02-21 DIAGNOSIS — I779 Disorder of arteries and arterioles, unspecified: Secondary | ICD-10-CM | POA: Diagnosis not present

## 2021-02-21 DIAGNOSIS — D649 Anemia, unspecified: Secondary | ICD-10-CM | POA: Diagnosis not present

## 2021-02-21 DIAGNOSIS — N182 Chronic kidney disease, stage 2 (mild): Secondary | ICD-10-CM | POA: Diagnosis not present

## 2021-02-21 DIAGNOSIS — G894 Chronic pain syndrome: Secondary | ICD-10-CM | POA: Diagnosis not present

## 2021-02-21 DIAGNOSIS — R2681 Unsteadiness on feet: Secondary | ICD-10-CM | POA: Diagnosis not present

## 2021-02-21 DIAGNOSIS — I639 Cerebral infarction, unspecified: Secondary | ICD-10-CM | POA: Diagnosis not present

## 2021-02-21 DIAGNOSIS — M6281 Muscle weakness (generalized): Secondary | ICD-10-CM | POA: Diagnosis not present

## 2021-02-22 DIAGNOSIS — M6281 Muscle weakness (generalized): Secondary | ICD-10-CM | POA: Diagnosis not present

## 2021-02-22 DIAGNOSIS — R2681 Unsteadiness on feet: Secondary | ICD-10-CM | POA: Diagnosis not present

## 2021-02-22 DIAGNOSIS — Z741 Need for assistance with personal care: Secondary | ICD-10-CM | POA: Diagnosis not present

## 2021-02-22 DIAGNOSIS — J9601 Acute respiratory failure with hypoxia: Secondary | ICD-10-CM | POA: Diagnosis not present

## 2021-02-22 DIAGNOSIS — Z9181 History of falling: Secondary | ICD-10-CM | POA: Diagnosis not present

## 2021-02-23 DIAGNOSIS — I2510 Atherosclerotic heart disease of native coronary artery without angina pectoris: Secondary | ICD-10-CM | POA: Diagnosis not present

## 2021-02-23 DIAGNOSIS — I951 Orthostatic hypotension: Secondary | ICD-10-CM | POA: Diagnosis not present

## 2021-02-23 DIAGNOSIS — N390 Urinary tract infection, site not specified: Secondary | ICD-10-CM | POA: Diagnosis not present

## 2021-02-24 DIAGNOSIS — J9601 Acute respiratory failure with hypoxia: Secondary | ICD-10-CM | POA: Diagnosis not present

## 2021-02-24 DIAGNOSIS — Z9181 History of falling: Secondary | ICD-10-CM | POA: Diagnosis not present

## 2021-02-24 DIAGNOSIS — R2681 Unsteadiness on feet: Secondary | ICD-10-CM | POA: Diagnosis not present

## 2021-02-24 DIAGNOSIS — Z741 Need for assistance with personal care: Secondary | ICD-10-CM | POA: Diagnosis not present

## 2021-02-24 DIAGNOSIS — M6281 Muscle weakness (generalized): Secondary | ICD-10-CM | POA: Diagnosis not present

## 2021-02-25 DIAGNOSIS — M6281 Muscle weakness (generalized): Secondary | ICD-10-CM | POA: Diagnosis not present

## 2021-02-25 DIAGNOSIS — G894 Chronic pain syndrome: Secondary | ICD-10-CM | POA: Diagnosis not present

## 2021-02-25 DIAGNOSIS — I10 Essential (primary) hypertension: Secondary | ICD-10-CM | POA: Diagnosis not present

## 2021-02-25 DIAGNOSIS — R2681 Unsteadiness on feet: Secondary | ICD-10-CM | POA: Diagnosis not present

## 2021-02-25 DIAGNOSIS — Z741 Need for assistance with personal care: Secondary | ICD-10-CM | POA: Diagnosis not present

## 2021-02-25 DIAGNOSIS — J9601 Acute respiratory failure with hypoxia: Secondary | ICD-10-CM | POA: Diagnosis not present

## 2021-02-25 DIAGNOSIS — I5032 Chronic diastolic (congestive) heart failure: Secondary | ICD-10-CM | POA: Diagnosis not present

## 2021-02-25 DIAGNOSIS — F32A Depression, unspecified: Secondary | ICD-10-CM | POA: Diagnosis not present

## 2021-02-25 DIAGNOSIS — Z9181 History of falling: Secondary | ICD-10-CM | POA: Diagnosis not present

## 2021-02-26 DIAGNOSIS — M6281 Muscle weakness (generalized): Secondary | ICD-10-CM | POA: Diagnosis not present

## 2021-02-26 DIAGNOSIS — Z741 Need for assistance with personal care: Secondary | ICD-10-CM | POA: Diagnosis not present

## 2021-02-26 DIAGNOSIS — R2681 Unsteadiness on feet: Secondary | ICD-10-CM | POA: Diagnosis not present

## 2021-02-26 DIAGNOSIS — J9601 Acute respiratory failure with hypoxia: Secondary | ICD-10-CM | POA: Diagnosis not present

## 2021-02-26 DIAGNOSIS — Z9181 History of falling: Secondary | ICD-10-CM | POA: Diagnosis not present

## 2021-02-27 DIAGNOSIS — R2681 Unsteadiness on feet: Secondary | ICD-10-CM | POA: Diagnosis not present

## 2021-02-27 DIAGNOSIS — J9601 Acute respiratory failure with hypoxia: Secondary | ICD-10-CM | POA: Diagnosis not present

## 2021-02-27 DIAGNOSIS — Z741 Need for assistance with personal care: Secondary | ICD-10-CM | POA: Diagnosis not present

## 2021-02-27 DIAGNOSIS — M6281 Muscle weakness (generalized): Secondary | ICD-10-CM | POA: Diagnosis not present

## 2021-02-27 DIAGNOSIS — Z9181 History of falling: Secondary | ICD-10-CM | POA: Diagnosis not present

## 2021-02-28 DIAGNOSIS — R2681 Unsteadiness on feet: Secondary | ICD-10-CM | POA: Diagnosis not present

## 2021-02-28 DIAGNOSIS — Z9181 History of falling: Secondary | ICD-10-CM | POA: Diagnosis not present

## 2021-02-28 DIAGNOSIS — M6281 Muscle weakness (generalized): Secondary | ICD-10-CM | POA: Diagnosis not present

## 2021-02-28 DIAGNOSIS — Z741 Need for assistance with personal care: Secondary | ICD-10-CM | POA: Diagnosis not present

## 2021-02-28 DIAGNOSIS — J9601 Acute respiratory failure with hypoxia: Secondary | ICD-10-CM | POA: Diagnosis not present

## 2021-03-01 DIAGNOSIS — M6281 Muscle weakness (generalized): Secondary | ICD-10-CM | POA: Diagnosis not present

## 2021-03-01 DIAGNOSIS — Z741 Need for assistance with personal care: Secondary | ICD-10-CM | POA: Diagnosis not present

## 2021-03-01 DIAGNOSIS — R2681 Unsteadiness on feet: Secondary | ICD-10-CM | POA: Diagnosis not present

## 2021-03-01 DIAGNOSIS — J9601 Acute respiratory failure with hypoxia: Secondary | ICD-10-CM | POA: Diagnosis not present

## 2021-03-01 DIAGNOSIS — Z9181 History of falling: Secondary | ICD-10-CM | POA: Diagnosis not present

## 2021-03-02 DIAGNOSIS — J9601 Acute respiratory failure with hypoxia: Secondary | ICD-10-CM | POA: Diagnosis not present

## 2021-03-02 DIAGNOSIS — Z741 Need for assistance with personal care: Secondary | ICD-10-CM | POA: Diagnosis not present

## 2021-03-02 DIAGNOSIS — Z9181 History of falling: Secondary | ICD-10-CM | POA: Diagnosis not present

## 2021-03-02 DIAGNOSIS — M6281 Muscle weakness (generalized): Secondary | ICD-10-CM | POA: Diagnosis not present

## 2021-03-02 DIAGNOSIS — R2681 Unsteadiness on feet: Secondary | ICD-10-CM | POA: Diagnosis not present

## 2021-03-04 DIAGNOSIS — Z741 Need for assistance with personal care: Secondary | ICD-10-CM | POA: Diagnosis not present

## 2021-03-04 DIAGNOSIS — Z9181 History of falling: Secondary | ICD-10-CM | POA: Diagnosis not present

## 2021-03-04 DIAGNOSIS — J9601 Acute respiratory failure with hypoxia: Secondary | ICD-10-CM | POA: Diagnosis not present

## 2021-03-04 DIAGNOSIS — M6281 Muscle weakness (generalized): Secondary | ICD-10-CM | POA: Diagnosis not present

## 2021-03-04 DIAGNOSIS — R2681 Unsteadiness on feet: Secondary | ICD-10-CM | POA: Diagnosis not present

## 2021-03-05 DIAGNOSIS — M6281 Muscle weakness (generalized): Secondary | ICD-10-CM | POA: Diagnosis not present

## 2021-03-05 DIAGNOSIS — R2681 Unsteadiness on feet: Secondary | ICD-10-CM | POA: Diagnosis not present

## 2021-03-05 DIAGNOSIS — Z9181 History of falling: Secondary | ICD-10-CM | POA: Diagnosis not present

## 2021-03-05 DIAGNOSIS — Z741 Need for assistance with personal care: Secondary | ICD-10-CM | POA: Diagnosis not present

## 2021-03-05 DIAGNOSIS — J9601 Acute respiratory failure with hypoxia: Secondary | ICD-10-CM | POA: Diagnosis not present

## 2021-03-06 DIAGNOSIS — Z9181 History of falling: Secondary | ICD-10-CM | POA: Diagnosis not present

## 2021-03-06 DIAGNOSIS — M6281 Muscle weakness (generalized): Secondary | ICD-10-CM | POA: Diagnosis not present

## 2021-03-06 DIAGNOSIS — J9601 Acute respiratory failure with hypoxia: Secondary | ICD-10-CM | POA: Diagnosis not present

## 2021-03-06 DIAGNOSIS — R2681 Unsteadiness on feet: Secondary | ICD-10-CM | POA: Diagnosis not present

## 2021-03-06 DIAGNOSIS — Z741 Need for assistance with personal care: Secondary | ICD-10-CM | POA: Diagnosis not present

## 2021-03-07 DIAGNOSIS — I10 Essential (primary) hypertension: Secondary | ICD-10-CM | POA: Diagnosis not present

## 2021-03-07 DIAGNOSIS — R0600 Dyspnea, unspecified: Secondary | ICD-10-CM | POA: Diagnosis not present

## 2021-03-07 DIAGNOSIS — F32A Depression, unspecified: Secondary | ICD-10-CM | POA: Diagnosis not present

## 2021-03-07 DIAGNOSIS — J9601 Acute respiratory failure with hypoxia: Secondary | ICD-10-CM | POA: Diagnosis not present

## 2021-03-07 DIAGNOSIS — M6281 Muscle weakness (generalized): Secondary | ICD-10-CM | POA: Diagnosis not present

## 2021-03-07 DIAGNOSIS — I5032 Chronic diastolic (congestive) heart failure: Secondary | ICD-10-CM | POA: Diagnosis not present

## 2021-03-07 DIAGNOSIS — R2681 Unsteadiness on feet: Secondary | ICD-10-CM | POA: Diagnosis not present

## 2021-03-08 DIAGNOSIS — M6281 Muscle weakness (generalized): Secondary | ICD-10-CM | POA: Diagnosis not present

## 2021-03-08 DIAGNOSIS — R2681 Unsteadiness on feet: Secondary | ICD-10-CM | POA: Diagnosis not present

## 2021-03-08 DIAGNOSIS — R0602 Shortness of breath: Secondary | ICD-10-CM | POA: Diagnosis not present

## 2021-03-08 DIAGNOSIS — J9601 Acute respiratory failure with hypoxia: Secondary | ICD-10-CM | POA: Diagnosis not present

## 2021-03-09 DIAGNOSIS — M549 Dorsalgia, unspecified: Secondary | ICD-10-CM | POA: Diagnosis not present

## 2021-03-09 DIAGNOSIS — I10 Essential (primary) hypertension: Secondary | ICD-10-CM | POA: Diagnosis not present

## 2021-03-09 DIAGNOSIS — I5032 Chronic diastolic (congestive) heart failure: Secondary | ICD-10-CM | POA: Diagnosis not present

## 2021-03-09 DIAGNOSIS — G894 Chronic pain syndrome: Secondary | ICD-10-CM | POA: Diagnosis not present

## 2021-03-09 DIAGNOSIS — R0600 Dyspnea, unspecified: Secondary | ICD-10-CM | POA: Diagnosis not present

## 2021-03-09 DIAGNOSIS — J9601 Acute respiratory failure with hypoxia: Secondary | ICD-10-CM | POA: Diagnosis not present

## 2021-03-09 DIAGNOSIS — F32A Depression, unspecified: Secondary | ICD-10-CM | POA: Diagnosis not present

## 2021-03-09 DIAGNOSIS — R2681 Unsteadiness on feet: Secondary | ICD-10-CM | POA: Diagnosis not present

## 2021-03-09 DIAGNOSIS — M6281 Muscle weakness (generalized): Secondary | ICD-10-CM | POA: Diagnosis not present

## 2021-03-10 DIAGNOSIS — R2681 Unsteadiness on feet: Secondary | ICD-10-CM | POA: Diagnosis not present

## 2021-03-10 DIAGNOSIS — J9601 Acute respiratory failure with hypoxia: Secondary | ICD-10-CM | POA: Diagnosis not present

## 2021-03-10 DIAGNOSIS — M6281 Muscle weakness (generalized): Secondary | ICD-10-CM | POA: Diagnosis not present

## 2021-03-12 DIAGNOSIS — J9601 Acute respiratory failure with hypoxia: Secondary | ICD-10-CM | POA: Diagnosis not present

## 2021-03-12 DIAGNOSIS — R2681 Unsteadiness on feet: Secondary | ICD-10-CM | POA: Diagnosis not present

## 2021-03-12 DIAGNOSIS — M6281 Muscle weakness (generalized): Secondary | ICD-10-CM | POA: Diagnosis not present

## 2021-03-13 DIAGNOSIS — M6281 Muscle weakness (generalized): Secondary | ICD-10-CM | POA: Diagnosis not present

## 2021-03-13 DIAGNOSIS — R2681 Unsteadiness on feet: Secondary | ICD-10-CM | POA: Diagnosis not present

## 2021-03-13 DIAGNOSIS — J9601 Acute respiratory failure with hypoxia: Secondary | ICD-10-CM | POA: Diagnosis not present

## 2021-03-14 DIAGNOSIS — M6281 Muscle weakness (generalized): Secondary | ICD-10-CM | POA: Diagnosis not present

## 2021-03-14 DIAGNOSIS — R2681 Unsteadiness on feet: Secondary | ICD-10-CM | POA: Diagnosis not present

## 2021-03-14 DIAGNOSIS — J9601 Acute respiratory failure with hypoxia: Secondary | ICD-10-CM | POA: Diagnosis not present

## 2021-03-15 DIAGNOSIS — M6281 Muscle weakness (generalized): Secondary | ICD-10-CM | POA: Diagnosis not present

## 2021-03-15 DIAGNOSIS — J9601 Acute respiratory failure with hypoxia: Secondary | ICD-10-CM | POA: Diagnosis not present

## 2021-03-15 DIAGNOSIS — R2681 Unsteadiness on feet: Secondary | ICD-10-CM | POA: Diagnosis not present

## 2021-03-16 DIAGNOSIS — R300 Dysuria: Secondary | ICD-10-CM | POA: Diagnosis not present

## 2021-03-16 DIAGNOSIS — G894 Chronic pain syndrome: Secondary | ICD-10-CM | POA: Diagnosis not present

## 2021-03-16 DIAGNOSIS — R2681 Unsteadiness on feet: Secondary | ICD-10-CM | POA: Diagnosis not present

## 2021-03-16 DIAGNOSIS — M6281 Muscle weakness (generalized): Secondary | ICD-10-CM | POA: Diagnosis not present

## 2021-03-16 DIAGNOSIS — I5032 Chronic diastolic (congestive) heart failure: Secondary | ICD-10-CM | POA: Diagnosis not present

## 2021-03-16 DIAGNOSIS — I10 Essential (primary) hypertension: Secondary | ICD-10-CM | POA: Diagnosis not present

## 2021-03-16 DIAGNOSIS — J9601 Acute respiratory failure with hypoxia: Secondary | ICD-10-CM | POA: Diagnosis not present

## 2021-03-17 DIAGNOSIS — N390 Urinary tract infection, site not specified: Secondary | ICD-10-CM | POA: Diagnosis not present

## 2021-03-19 DIAGNOSIS — G894 Chronic pain syndrome: Secondary | ICD-10-CM | POA: Diagnosis not present

## 2021-03-19 DIAGNOSIS — N390 Urinary tract infection, site not specified: Secondary | ICD-10-CM | POA: Diagnosis not present

## 2021-03-19 DIAGNOSIS — F32A Depression, unspecified: Secondary | ICD-10-CM | POA: Diagnosis not present

## 2021-03-19 DIAGNOSIS — I10 Essential (primary) hypertension: Secondary | ICD-10-CM | POA: Diagnosis not present

## 2021-03-19 DIAGNOSIS — I5032 Chronic diastolic (congestive) heart failure: Secondary | ICD-10-CM | POA: Diagnosis not present

## 2021-03-19 DIAGNOSIS — R2681 Unsteadiness on feet: Secondary | ICD-10-CM | POA: Diagnosis not present

## 2021-03-19 DIAGNOSIS — J9601 Acute respiratory failure with hypoxia: Secondary | ICD-10-CM | POA: Diagnosis not present

## 2021-03-19 DIAGNOSIS — M6281 Muscle weakness (generalized): Secondary | ICD-10-CM | POA: Diagnosis not present

## 2021-03-20 DIAGNOSIS — M6281 Muscle weakness (generalized): Secondary | ICD-10-CM | POA: Diagnosis not present

## 2021-03-20 DIAGNOSIS — J9601 Acute respiratory failure with hypoxia: Secondary | ICD-10-CM | POA: Diagnosis not present

## 2021-03-20 DIAGNOSIS — R2681 Unsteadiness on feet: Secondary | ICD-10-CM | POA: Diagnosis not present

## 2021-03-21 DIAGNOSIS — M6281 Muscle weakness (generalized): Secondary | ICD-10-CM | POA: Diagnosis not present

## 2021-03-21 DIAGNOSIS — R2681 Unsteadiness on feet: Secondary | ICD-10-CM | POA: Diagnosis not present

## 2021-03-21 DIAGNOSIS — J9601 Acute respiratory failure with hypoxia: Secondary | ICD-10-CM | POA: Diagnosis not present

## 2021-03-22 DIAGNOSIS — I5032 Chronic diastolic (congestive) heart failure: Secondary | ICD-10-CM | POA: Diagnosis not present

## 2021-03-22 DIAGNOSIS — G894 Chronic pain syndrome: Secondary | ICD-10-CM | POA: Diagnosis not present

## 2021-03-22 DIAGNOSIS — N390 Urinary tract infection, site not specified: Secondary | ICD-10-CM | POA: Diagnosis not present

## 2021-03-22 DIAGNOSIS — I10 Essential (primary) hypertension: Secondary | ICD-10-CM | POA: Diagnosis not present

## 2021-03-22 DIAGNOSIS — R2681 Unsteadiness on feet: Secondary | ICD-10-CM | POA: Diagnosis not present

## 2021-03-22 DIAGNOSIS — M6281 Muscle weakness (generalized): Secondary | ICD-10-CM | POA: Diagnosis not present

## 2021-03-22 DIAGNOSIS — J9601 Acute respiratory failure with hypoxia: Secondary | ICD-10-CM | POA: Diagnosis not present

## 2021-03-22 DIAGNOSIS — K5900 Constipation, unspecified: Secondary | ICD-10-CM | POA: Diagnosis not present

## 2021-03-22 DIAGNOSIS — F32A Depression, unspecified: Secondary | ICD-10-CM | POA: Diagnosis not present

## 2021-03-23 DIAGNOSIS — M6281 Muscle weakness (generalized): Secondary | ICD-10-CM | POA: Diagnosis not present

## 2021-03-23 DIAGNOSIS — J9601 Acute respiratory failure with hypoxia: Secondary | ICD-10-CM | POA: Diagnosis not present

## 2021-03-23 DIAGNOSIS — R2681 Unsteadiness on feet: Secondary | ICD-10-CM | POA: Diagnosis not present

## 2021-03-25 DIAGNOSIS — G894 Chronic pain syndrome: Secondary | ICD-10-CM | POA: Diagnosis not present

## 2021-03-25 DIAGNOSIS — I10 Essential (primary) hypertension: Secondary | ICD-10-CM | POA: Diagnosis not present

## 2021-03-25 DIAGNOSIS — F32A Depression, unspecified: Secondary | ICD-10-CM | POA: Diagnosis not present

## 2021-03-25 DIAGNOSIS — I5032 Chronic diastolic (congestive) heart failure: Secondary | ICD-10-CM | POA: Diagnosis not present

## 2021-03-25 DIAGNOSIS — G629 Polyneuropathy, unspecified: Secondary | ICD-10-CM | POA: Diagnosis not present

## 2021-03-26 DIAGNOSIS — M6281 Muscle weakness (generalized): Secondary | ICD-10-CM | POA: Diagnosis not present

## 2021-03-26 DIAGNOSIS — J9601 Acute respiratory failure with hypoxia: Secondary | ICD-10-CM | POA: Diagnosis not present

## 2021-03-26 DIAGNOSIS — R2681 Unsteadiness on feet: Secondary | ICD-10-CM | POA: Diagnosis not present

## 2021-03-27 DIAGNOSIS — R2681 Unsteadiness on feet: Secondary | ICD-10-CM | POA: Diagnosis not present

## 2021-03-27 DIAGNOSIS — J9601 Acute respiratory failure with hypoxia: Secondary | ICD-10-CM | POA: Diagnosis not present

## 2021-03-27 DIAGNOSIS — M6281 Muscle weakness (generalized): Secondary | ICD-10-CM | POA: Diagnosis not present

## 2021-03-28 DIAGNOSIS — M6281 Muscle weakness (generalized): Secondary | ICD-10-CM | POA: Diagnosis not present

## 2021-03-28 DIAGNOSIS — R2681 Unsteadiness on feet: Secondary | ICD-10-CM | POA: Diagnosis not present

## 2021-03-28 DIAGNOSIS — J9601 Acute respiratory failure with hypoxia: Secondary | ICD-10-CM | POA: Diagnosis not present

## 2021-03-29 DIAGNOSIS — J9601 Acute respiratory failure with hypoxia: Secondary | ICD-10-CM | POA: Diagnosis not present

## 2021-03-29 DIAGNOSIS — R2681 Unsteadiness on feet: Secondary | ICD-10-CM | POA: Diagnosis not present

## 2021-03-29 DIAGNOSIS — M6281 Muscle weakness (generalized): Secondary | ICD-10-CM | POA: Diagnosis not present

## 2021-03-30 DIAGNOSIS — N390 Urinary tract infection, site not specified: Secondary | ICD-10-CM | POA: Diagnosis not present

## 2021-03-30 DIAGNOSIS — I2510 Atherosclerotic heart disease of native coronary artery without angina pectoris: Secondary | ICD-10-CM | POA: Diagnosis not present

## 2021-03-30 DIAGNOSIS — M6281 Muscle weakness (generalized): Secondary | ICD-10-CM | POA: Diagnosis not present

## 2021-03-30 DIAGNOSIS — I951 Orthostatic hypotension: Secondary | ICD-10-CM | POA: Diagnosis not present

## 2021-03-30 DIAGNOSIS — J9601 Acute respiratory failure with hypoxia: Secondary | ICD-10-CM | POA: Diagnosis not present

## 2021-03-30 DIAGNOSIS — R2681 Unsteadiness on feet: Secondary | ICD-10-CM | POA: Diagnosis not present

## 2021-03-31 DIAGNOSIS — J9601 Acute respiratory failure with hypoxia: Secondary | ICD-10-CM | POA: Diagnosis not present

## 2021-03-31 DIAGNOSIS — M6281 Muscle weakness (generalized): Secondary | ICD-10-CM | POA: Diagnosis not present

## 2021-03-31 DIAGNOSIS — R2681 Unsteadiness on feet: Secondary | ICD-10-CM | POA: Diagnosis not present

## 2021-04-02 DIAGNOSIS — J9601 Acute respiratory failure with hypoxia: Secondary | ICD-10-CM | POA: Diagnosis not present

## 2021-04-02 DIAGNOSIS — R2681 Unsteadiness on feet: Secondary | ICD-10-CM | POA: Diagnosis not present

## 2021-04-02 DIAGNOSIS — M6281 Muscle weakness (generalized): Secondary | ICD-10-CM | POA: Diagnosis not present

## 2021-04-03 DIAGNOSIS — M6281 Muscle weakness (generalized): Secondary | ICD-10-CM | POA: Diagnosis not present

## 2021-04-03 DIAGNOSIS — R2681 Unsteadiness on feet: Secondary | ICD-10-CM | POA: Diagnosis not present

## 2021-04-03 DIAGNOSIS — J9601 Acute respiratory failure with hypoxia: Secondary | ICD-10-CM | POA: Diagnosis not present

## 2021-04-04 DIAGNOSIS — I10 Essential (primary) hypertension: Secondary | ICD-10-CM | POA: Diagnosis not present

## 2021-04-04 DIAGNOSIS — I5032 Chronic diastolic (congestive) heart failure: Secondary | ICD-10-CM | POA: Diagnosis not present

## 2021-04-04 DIAGNOSIS — N3281 Overactive bladder: Secondary | ICD-10-CM | POA: Diagnosis not present

## 2021-04-04 DIAGNOSIS — F32A Depression, unspecified: Secondary | ICD-10-CM | POA: Diagnosis not present

## 2021-04-04 DIAGNOSIS — G894 Chronic pain syndrome: Secondary | ICD-10-CM | POA: Diagnosis not present

## 2021-04-05 DIAGNOSIS — R2681 Unsteadiness on feet: Secondary | ICD-10-CM | POA: Diagnosis not present

## 2021-04-05 DIAGNOSIS — M6281 Muscle weakness (generalized): Secondary | ICD-10-CM | POA: Diagnosis not present

## 2021-04-05 DIAGNOSIS — J9601 Acute respiratory failure with hypoxia: Secondary | ICD-10-CM | POA: Diagnosis not present

## 2021-04-06 DIAGNOSIS — M6281 Muscle weakness (generalized): Secondary | ICD-10-CM | POA: Diagnosis not present

## 2021-04-06 DIAGNOSIS — J9601 Acute respiratory failure with hypoxia: Secondary | ICD-10-CM | POA: Diagnosis not present

## 2021-04-06 DIAGNOSIS — R2681 Unsteadiness on feet: Secondary | ICD-10-CM | POA: Diagnosis not present

## 2021-04-07 DIAGNOSIS — M6281 Muscle weakness (generalized): Secondary | ICD-10-CM | POA: Diagnosis not present

## 2021-04-07 DIAGNOSIS — R2681 Unsteadiness on feet: Secondary | ICD-10-CM | POA: Diagnosis not present

## 2021-04-07 DIAGNOSIS — J9601 Acute respiratory failure with hypoxia: Secondary | ICD-10-CM | POA: Diagnosis not present

## 2021-04-09 DIAGNOSIS — M6281 Muscle weakness (generalized): Secondary | ICD-10-CM | POA: Diagnosis not present

## 2021-04-09 DIAGNOSIS — R2681 Unsteadiness on feet: Secondary | ICD-10-CM | POA: Diagnosis not present

## 2021-04-09 DIAGNOSIS — J9601 Acute respiratory failure with hypoxia: Secondary | ICD-10-CM | POA: Diagnosis not present

## 2021-04-10 DIAGNOSIS — J9601 Acute respiratory failure with hypoxia: Secondary | ICD-10-CM | POA: Diagnosis not present

## 2021-04-10 DIAGNOSIS — R2681 Unsteadiness on feet: Secondary | ICD-10-CM | POA: Diagnosis not present

## 2021-04-10 DIAGNOSIS — M6281 Muscle weakness (generalized): Secondary | ICD-10-CM | POA: Diagnosis not present

## 2021-04-11 DIAGNOSIS — M6281 Muscle weakness (generalized): Secondary | ICD-10-CM | POA: Diagnosis not present

## 2021-04-11 DIAGNOSIS — J9601 Acute respiratory failure with hypoxia: Secondary | ICD-10-CM | POA: Diagnosis not present

## 2021-04-11 DIAGNOSIS — R2681 Unsteadiness on feet: Secondary | ICD-10-CM | POA: Diagnosis not present

## 2021-04-12 DIAGNOSIS — I10 Essential (primary) hypertension: Secondary | ICD-10-CM | POA: Diagnosis not present

## 2021-04-12 DIAGNOSIS — G894 Chronic pain syndrome: Secondary | ICD-10-CM | POA: Diagnosis not present

## 2021-04-12 DIAGNOSIS — I5032 Chronic diastolic (congestive) heart failure: Secondary | ICD-10-CM | POA: Diagnosis not present

## 2021-04-12 DIAGNOSIS — R2681 Unsteadiness on feet: Secondary | ICD-10-CM | POA: Diagnosis not present

## 2021-04-12 DIAGNOSIS — M6281 Muscle weakness (generalized): Secondary | ICD-10-CM | POA: Diagnosis not present

## 2021-04-12 DIAGNOSIS — J9601 Acute respiratory failure with hypoxia: Secondary | ICD-10-CM | POA: Diagnosis not present

## 2021-04-12 DIAGNOSIS — M549 Dorsalgia, unspecified: Secondary | ICD-10-CM | POA: Diagnosis not present

## 2021-04-12 DIAGNOSIS — F32A Depression, unspecified: Secondary | ICD-10-CM | POA: Diagnosis not present

## 2021-04-15 DIAGNOSIS — M6281 Muscle weakness (generalized): Secondary | ICD-10-CM | POA: Diagnosis not present

## 2021-04-15 DIAGNOSIS — J9601 Acute respiratory failure with hypoxia: Secondary | ICD-10-CM | POA: Diagnosis not present

## 2021-04-15 DIAGNOSIS — R2681 Unsteadiness on feet: Secondary | ICD-10-CM | POA: Diagnosis not present

## 2021-04-16 DIAGNOSIS — R2681 Unsteadiness on feet: Secondary | ICD-10-CM | POA: Diagnosis not present

## 2021-04-16 DIAGNOSIS — M6281 Muscle weakness (generalized): Secondary | ICD-10-CM | POA: Diagnosis not present

## 2021-04-16 DIAGNOSIS — J9601 Acute respiratory failure with hypoxia: Secondary | ICD-10-CM | POA: Diagnosis not present

## 2021-04-17 DIAGNOSIS — M6281 Muscle weakness (generalized): Secondary | ICD-10-CM | POA: Diagnosis not present

## 2021-04-17 DIAGNOSIS — R2681 Unsteadiness on feet: Secondary | ICD-10-CM | POA: Diagnosis not present

## 2021-04-17 DIAGNOSIS — J9601 Acute respiratory failure with hypoxia: Secondary | ICD-10-CM | POA: Diagnosis not present

## 2021-04-18 DIAGNOSIS — F32A Depression, unspecified: Secondary | ICD-10-CM | POA: Diagnosis not present

## 2021-04-18 DIAGNOSIS — I5032 Chronic diastolic (congestive) heart failure: Secondary | ICD-10-CM | POA: Diagnosis not present

## 2021-04-18 DIAGNOSIS — G894 Chronic pain syndrome: Secondary | ICD-10-CM | POA: Diagnosis not present

## 2021-04-18 DIAGNOSIS — M6281 Muscle weakness (generalized): Secondary | ICD-10-CM | POA: Diagnosis not present

## 2021-04-18 DIAGNOSIS — J9601 Acute respiratory failure with hypoxia: Secondary | ICD-10-CM | POA: Diagnosis not present

## 2021-04-18 DIAGNOSIS — I10 Essential (primary) hypertension: Secondary | ICD-10-CM | POA: Diagnosis not present

## 2021-04-18 DIAGNOSIS — R2681 Unsteadiness on feet: Secondary | ICD-10-CM | POA: Diagnosis not present

## 2021-04-19 DIAGNOSIS — R2681 Unsteadiness on feet: Secondary | ICD-10-CM | POA: Diagnosis not present

## 2021-04-19 DIAGNOSIS — M6281 Muscle weakness (generalized): Secondary | ICD-10-CM | POA: Diagnosis not present

## 2021-04-19 DIAGNOSIS — J9601 Acute respiratory failure with hypoxia: Secondary | ICD-10-CM | POA: Diagnosis not present

## 2021-04-20 DIAGNOSIS — R2681 Unsteadiness on feet: Secondary | ICD-10-CM | POA: Diagnosis not present

## 2021-04-20 DIAGNOSIS — M6281 Muscle weakness (generalized): Secondary | ICD-10-CM | POA: Diagnosis not present

## 2021-04-20 DIAGNOSIS — I951 Orthostatic hypotension: Secondary | ICD-10-CM | POA: Diagnosis not present

## 2021-04-20 DIAGNOSIS — J9601 Acute respiratory failure with hypoxia: Secondary | ICD-10-CM | POA: Diagnosis not present

## 2021-04-20 DIAGNOSIS — N390 Urinary tract infection, site not specified: Secondary | ICD-10-CM | POA: Diagnosis not present

## 2021-04-20 DIAGNOSIS — I2510 Atherosclerotic heart disease of native coronary artery without angina pectoris: Secondary | ICD-10-CM | POA: Diagnosis not present

## 2021-04-23 DIAGNOSIS — M6281 Muscle weakness (generalized): Secondary | ICD-10-CM | POA: Diagnosis not present

## 2021-04-23 DIAGNOSIS — R2681 Unsteadiness on feet: Secondary | ICD-10-CM | POA: Diagnosis not present

## 2021-04-23 DIAGNOSIS — J9601 Acute respiratory failure with hypoxia: Secondary | ICD-10-CM | POA: Diagnosis not present

## 2021-04-24 DIAGNOSIS — R2681 Unsteadiness on feet: Secondary | ICD-10-CM | POA: Diagnosis not present

## 2021-04-24 DIAGNOSIS — M6281 Muscle weakness (generalized): Secondary | ICD-10-CM | POA: Diagnosis not present

## 2021-04-24 DIAGNOSIS — J9601 Acute respiratory failure with hypoxia: Secondary | ICD-10-CM | POA: Diagnosis not present

## 2021-04-25 DIAGNOSIS — J9601 Acute respiratory failure with hypoxia: Secondary | ICD-10-CM | POA: Diagnosis not present

## 2021-04-25 DIAGNOSIS — M6281 Muscle weakness (generalized): Secondary | ICD-10-CM | POA: Diagnosis not present

## 2021-04-25 DIAGNOSIS — E785 Hyperlipidemia, unspecified: Secondary | ICD-10-CM | POA: Diagnosis not present

## 2021-04-25 DIAGNOSIS — R2681 Unsteadiness on feet: Secondary | ICD-10-CM | POA: Diagnosis not present

## 2021-04-25 DIAGNOSIS — D649 Anemia, unspecified: Secondary | ICD-10-CM | POA: Diagnosis not present

## 2021-04-25 DIAGNOSIS — E089 Diabetes mellitus due to underlying condition without complications: Secondary | ICD-10-CM | POA: Diagnosis not present

## 2021-04-26 DIAGNOSIS — D649 Anemia, unspecified: Secondary | ICD-10-CM | POA: Diagnosis not present

## 2021-04-26 DIAGNOSIS — M549 Dorsalgia, unspecified: Secondary | ICD-10-CM | POA: Diagnosis not present

## 2021-04-26 DIAGNOSIS — I10 Essential (primary) hypertension: Secondary | ICD-10-CM | POA: Diagnosis not present

## 2021-04-26 DIAGNOSIS — J9601 Acute respiratory failure with hypoxia: Secondary | ICD-10-CM | POA: Diagnosis not present

## 2021-04-26 DIAGNOSIS — I5032 Chronic diastolic (congestive) heart failure: Secondary | ICD-10-CM | POA: Diagnosis not present

## 2021-04-26 DIAGNOSIS — M6281 Muscle weakness (generalized): Secondary | ICD-10-CM | POA: Diagnosis not present

## 2021-04-26 DIAGNOSIS — R2681 Unsteadiness on feet: Secondary | ICD-10-CM | POA: Diagnosis not present

## 2021-04-26 DIAGNOSIS — G894 Chronic pain syndrome: Secondary | ICD-10-CM | POA: Diagnosis not present

## 2021-04-26 DIAGNOSIS — N329 Bladder disorder, unspecified: Secondary | ICD-10-CM | POA: Diagnosis not present

## 2021-04-26 DIAGNOSIS — D519 Vitamin B12 deficiency anemia, unspecified: Secondary | ICD-10-CM | POA: Diagnosis not present

## 2021-04-27 DIAGNOSIS — H5202 Hypermetropia, left eye: Secondary | ICD-10-CM | POA: Diagnosis not present

## 2021-04-27 DIAGNOSIS — J9601 Acute respiratory failure with hypoxia: Secondary | ICD-10-CM | POA: Diagnosis not present

## 2021-04-27 DIAGNOSIS — R2681 Unsteadiness on feet: Secondary | ICD-10-CM | POA: Diagnosis not present

## 2021-04-27 DIAGNOSIS — M6281 Muscle weakness (generalized): Secondary | ICD-10-CM | POA: Diagnosis not present

## 2021-04-27 DIAGNOSIS — N390 Urinary tract infection, site not specified: Secondary | ICD-10-CM | POA: Diagnosis not present

## 2021-04-29 DIAGNOSIS — M6281 Muscle weakness (generalized): Secondary | ICD-10-CM | POA: Diagnosis not present

## 2021-04-29 DIAGNOSIS — J9601 Acute respiratory failure with hypoxia: Secondary | ICD-10-CM | POA: Diagnosis not present

## 2021-04-29 DIAGNOSIS — R2681 Unsteadiness on feet: Secondary | ICD-10-CM | POA: Diagnosis not present

## 2021-04-30 DIAGNOSIS — J9601 Acute respiratory failure with hypoxia: Secondary | ICD-10-CM | POA: Diagnosis not present

## 2021-04-30 DIAGNOSIS — M6281 Muscle weakness (generalized): Secondary | ICD-10-CM | POA: Diagnosis not present

## 2021-04-30 DIAGNOSIS — R2681 Unsteadiness on feet: Secondary | ICD-10-CM | POA: Diagnosis not present

## 2021-05-01 DIAGNOSIS — M6281 Muscle weakness (generalized): Secondary | ICD-10-CM | POA: Diagnosis not present

## 2021-05-01 DIAGNOSIS — J9601 Acute respiratory failure with hypoxia: Secondary | ICD-10-CM | POA: Diagnosis not present

## 2021-05-01 DIAGNOSIS — R2681 Unsteadiness on feet: Secondary | ICD-10-CM | POA: Diagnosis not present

## 2021-05-02 DIAGNOSIS — J9601 Acute respiratory failure with hypoxia: Secondary | ICD-10-CM | POA: Diagnosis not present

## 2021-05-02 DIAGNOSIS — M6281 Muscle weakness (generalized): Secondary | ICD-10-CM | POA: Diagnosis not present

## 2021-05-02 DIAGNOSIS — R2681 Unsteadiness on feet: Secondary | ICD-10-CM | POA: Diagnosis not present

## 2021-05-03 DIAGNOSIS — M6281 Muscle weakness (generalized): Secondary | ICD-10-CM | POA: Diagnosis not present

## 2021-05-03 DIAGNOSIS — R2681 Unsteadiness on feet: Secondary | ICD-10-CM | POA: Diagnosis not present

## 2021-05-03 DIAGNOSIS — J9601 Acute respiratory failure with hypoxia: Secondary | ICD-10-CM | POA: Diagnosis not present

## 2021-05-04 DIAGNOSIS — M6281 Muscle weakness (generalized): Secondary | ICD-10-CM | POA: Diagnosis not present

## 2021-05-04 DIAGNOSIS — J9601 Acute respiratory failure with hypoxia: Secondary | ICD-10-CM | POA: Diagnosis not present

## 2021-05-04 DIAGNOSIS — R2681 Unsteadiness on feet: Secondary | ICD-10-CM | POA: Diagnosis not present

## 2021-05-06 DIAGNOSIS — R2681 Unsteadiness on feet: Secondary | ICD-10-CM | POA: Diagnosis not present

## 2021-05-06 DIAGNOSIS — J9601 Acute respiratory failure with hypoxia: Secondary | ICD-10-CM | POA: Diagnosis not present

## 2021-05-06 DIAGNOSIS — M6281 Muscle weakness (generalized): Secondary | ICD-10-CM | POA: Diagnosis not present

## 2021-05-07 DIAGNOSIS — M6281 Muscle weakness (generalized): Secondary | ICD-10-CM | POA: Diagnosis not present

## 2021-05-07 DIAGNOSIS — J9601 Acute respiratory failure with hypoxia: Secondary | ICD-10-CM | POA: Diagnosis not present

## 2021-05-07 DIAGNOSIS — R2681 Unsteadiness on feet: Secondary | ICD-10-CM | POA: Diagnosis not present

## 2021-05-07 NOTE — Progress Notes (Signed)
?  ?Cardiology Office Note ? ? ?Date:  05/08/2021  ? ?ID:  Andrea Kirk, DOB 01/16/1940, MRN [id number] ? ?PCP:  Aura Dials, MD  ? ? ?No chief complaint on file. ? ?CAD ? ?Wt Readings from Last 3 Encounters:  ?05/08/21 161 lb (73 kg)  ?02/20/21 175 lb 14.8 oz (79.8 kg)  ?12/26/20 165 lb (74.8 kg)  ?  ? ?  ?History of Present Illness: ?Andrea Kirk is a 81 y.o. female   who had CABG in 1997.  She suffered a fall in 2014 due to her knee giving way. She did not pass out at that time. She had significant bruising on her face at that time.   ?  ?She worked at [employer] on [address].  She works only between 7A-7P for 4-5 hours at a time due to leg problems.   ?  ?She had a right CEA in 2018.  She had several falls after the surgery.  She has broken her right wrist.   ?  ?She subsequently broke her collar bone but did not require surgery.  ?  ?In 10/19, she had a UTI.  She then was on an antibiotic and had leg swelling.  She was started on Lasix. Her weight was up 14 lbs at that time.  It came down with diuresis.   ?  ?She moved to a senior community in 11/19, to [facility]. ?  ?She had some swelling before the move as well.  It resolved with Lasix.   ?  ?Last May 2021, she was walking in to [employer] to get her meds, she fell face first.  She did not pass out.  She just fell and broke her left wrist and right knee and some ribs.  She lost 2 teeth as well.  She fell in 1/22 at [place] when she did not use her cane and was "holding on to the wall." ?  ?She has had some issues with anemia.  THought to be age related and vitamin supplements recommended.  ?  ?She was started on lisinopril due to protein in urine.  ? ?Was in the hospital in 2/23 for : "altered mental status.  On presentation she was febrile, hypertensive, hypoxic on room air.  Lab work showed creatinine of 2, UA showed elevated leukocytes.  Chest x-ray showed mild bibasilar atelectasis or pneumonia, vascular congestion, small right pleural  effusion.  Code sepsis was initiated in the emergency department and was given IV fluids.  Lab work also showed elevated troponin, cardiology consulted.  Blood cultures showed E. coli with source likely the urine.  She was having frequent diarrhea, C. difficile came out to positive.  Started on vancomycin.  Overall status including mental status have significantly improved.  Plan is to discharge back to skilled nursing facility today." ? ?Echo in 2/23  showed: ?"Left ventricular ejection fraction, by estimation, is 50 to 55%. The  ?left ventricle has low normal function. The left ventricle has no regional  ?wall motion abnormalities. There is mild concentric left ventricular  ?hypertrophy. Left ventricular  ?diastolic parameters are consistent with Grade III diastolic dysfunction  ?(restrictive). Elevated left ventricular end-diastolic pressure.  ? 2. Right ventricular systolic function is normal. The right ventricular  ?size is mildly enlarged. There is severely elevated pulmonary artery  ?systolic pressure. The estimated right ventricular systolic pressure is  ?67.3 mmHg.  ? 3. Left atrial size was severely dilated.  ? 4. Right atrial size was moderately dilated.  ?  5. The mitral valve is grossly normal. Mild mitral valve regurgitation.  ?No evidence of mitral stenosis. Moderate mitral annular calcification.  ? 6. Tricuspid valve regurgitation is moderate.  ? 7. The aortic valve is tricuspid. There is mild calcification of the  ?aortic valve. There is mild thickening of the aortic valve. Aortic valve  ?regurgitation is not visualized. Aortic valve sclerosis/calcification is  ?present, without any evidence of  ?aortic stenosis.  ? 8. The inferior vena cava is normal in size with <50% respiratory  ?variability, suggesting right atrial pressure of 8 mmHg." ? ?Reports that she is still not quite feeling herself. ? ?Denies : Chest pain. Dizziness. Leg edema. Nitroglycerin use. Orthopnea. Palpitations. Paroxysmal  nocturnal dyspnea. Shortness of breath. Syncope.  ?She reports working with physical therapy to try to strengthen her legs.  She wants to get out of the wheelchair. ? ?Past Medical History:  ?Diagnosis Date  ? Acute kidney injury ([place]) 08/05/2016  ? Aftercare following surgery of the circulatory system, [place] 05/11/2013  ? AKI (acute kidney injury) ([place]) 08/04/2016  ? Anxiety   ? takes Celexa daily  ? ARF (acute renal failure) ([place]) 11/07/2016  ? Asymptomatic stenosis of right carotid artery 03/22/2016  ? Back pain   ? occasionally  ? Carotid artery disease ([place]) 04/16/2013  ? Carotid artery occlusion   ? Carotid stenosis 11/16/2013  ? Cataract   ? left and immature  ? Coronary artery disease   ? Coronary atherosclerosis of native coronary artery 03/11/2013  ? S/p CABG in 1997   ? Depression   ? Diabetes mellitus   ? takes Metformin and Glipizide daily  ? Diabetes mellitus ([place]) 05/02/2015  ? Dizziness   ? takes Meclizine daily as needed  ? Essential hypertension, benign 03/11/2013  ? GERD (gastroesophageal reflux disease)   ? takes Omeprazole daily as needed  ? Headache(784.0)   ? Hyperlipidemia   ? takes Atorvastatin daily  ? Hypertension   ? takes Carvedilol daily  ? Mixed hyperlipidemia 03/11/2013  ? Muscle spasm   ? takes Robaxin daily as needed  ? Nausea   ? takes Phenergan daily as needed  ? Occlusion and stenosis of carotid artery without mention of cerebral infarction 07/19/2011  ? Pneumonia   ? hx of-in high school  ? Restless leg   ? takes Requip daily as needed  ? Seasonal allergies   ? takes Claritin daily as needed and Afrin as needed  ? Shortness of breath   ? with exertion  ? Urinary urgency   ? UTI (urinary tract infection) 11/07/2016  ? ? ?Past Surgical History:  ?Procedure Laterality Date  ? COLONOSCOPY WITH PROPOFOL N/A 03/10/2012  ? Procedure: COLONOSCOPY WITH PROPOFOL;  Surgeon: Garlan Fair, MD;  [facility]: [facility];  Service: Endoscopy;  Laterality: N/A;  ? CORNEAL TRANSPLANT Right   ? CORONARY ARTERY  BYPASS GRAFT  1997  ? x 6  ? CORONARY ARTERY BYPASS GRAFT  Jan. 1997  ? ENDARTERECTOMY Left 04/16/2013  ? Procedure: Left Carotid Artery Endatarectomy with Resection of Redundant Internal Carotid Artery;  Surgeon: Rosetta Posner, MD;  Location: [facility];  Service: Vascular;  Laterality: Left;  ? ENDARTERECTOMY Right 03/22/2016  ? Procedure: RIGHT ENDARTERECTOMY CAROTID;  Surgeon: Rosetta Posner, MD;  Location: [facility];  Service: Vascular;  Laterality: Right;  ? ESOPHAGOGASTRODUODENOSCOPY N/A 03/10/2012  ? Procedure: ESOPHAGOGASTRODUODENOSCOPY (EGD);  Surgeon: Garlan Fair, MD;  [facility];  Service: Endoscopy;  Laterality: N/A;  ? EYE  SURGERY  March 12, 2001  ? CORNEA TRANSPLANT Right eye  ? PATCH ANGIOPLASTY Right 03/22/2016  ? Procedure: PATCH ANGIOPLASTY;  Surgeon: Rosetta Posner, MD;  Location: [facility];  Service: Vascular;  Laterality: Right;  ? [place]  ? SPINE SURGERY  march 2013  ? Back surgery  ? TONSILLECTOMY    ? TRIGGER FINGER RELEASE Left   ? thumb  ? ? ? ?Current Outpatient Medications  ?Medication Sig Dispense Refill  ? acetaminophen (TYLENOL) 325 MG tablet Take 650 mg by mouth every 6 (six) hours as needed for moderate pain.    ? amLODipine (NORVASC) 5 MG tablet Take 1 tablet (5 mg total) by mouth daily.    ? Ascorbic Acid (VITAMIN C) 1000 MG tablet Take 1,000 mg by mouth daily.    ? aspirin EC 81 MG tablet Take 81 mg by mouth every evening.     ? busPIRone (BUSPAR) 7.5 MG tablet Take 7.5 mg by mouth 2 (two) times daily.    ? citalopram (CELEXA) 20 MG tablet Take 20 mg by mouth daily.    ? Cyanocobalamin (B-12) 1000 MCG TABS Take 1,000 mcg by mouth daily.    ? docusate sodium (COLACE) 100 MG capsule Take 100 mg by mouth 2 (two) times daily.    ? ferrous sulfate 325 (65 FE) MG tablet Take 325 mg by mouth daily.    ? furosemide (LASIX) 20 MG tablet Take 20 mg by mouth daily as needed for edema.    ? gabapentin (NEURONTIN) 100 MG capsule Take 1 capsule (100 mg total) by  mouth 3 (three) times daily.    ? glipiZIDE (GLUCOTROL XL) 5 MG 24 hr tablet Take 1 tablet (5 mg total) by mouth daily with breakfast. 30 tablet 0  ? HYDROcodone-acetaminophen (NORCO/VICODIN) 5-325 MG tablet Take 1 table

## 2021-05-08 ENCOUNTER — Encounter (INDEPENDENT_AMBULATORY_CARE_PROVIDER_SITE_OTHER): Payer: Self-pay

## 2021-05-08 ENCOUNTER — Encounter: Payer: Self-pay | Admitting: Interventional Cardiology

## 2021-05-08 ENCOUNTER — Ambulatory Visit (INDEPENDENT_AMBULATORY_CARE_PROVIDER_SITE_OTHER): Payer: Medicare Other | Admitting: Interventional Cardiology

## 2021-05-08 VITALS — BP 138/68 | HR 61 | Ht 65.0 in | Wt 161.0 lb

## 2021-05-08 DIAGNOSIS — I25119 Atherosclerotic heart disease of native coronary artery with unspecified angina pectoris: Secondary | ICD-10-CM

## 2021-05-08 DIAGNOSIS — I5032 Chronic diastolic (congestive) heart failure: Secondary | ICD-10-CM

## 2021-05-08 DIAGNOSIS — I10 Essential (primary) hypertension: Secondary | ICD-10-CM | POA: Diagnosis not present

## 2021-05-08 DIAGNOSIS — E1159 Type 2 diabetes mellitus with other circulatory complications: Secondary | ICD-10-CM

## 2021-05-08 MED ORDER — ROSUVASTATIN CALCIUM 20 MG PO TABS: 20.0000 mg | ORAL_TABLET | Freq: Every day | ORAL | 3 refills | Status: DC

## 2021-05-08 NOTE — Patient Instructions (Addendum)
Medication Instructions:  ?Your physician has recommended you make the following change in your medication: Start Rosuvastatin 20 mg by mouth daily ? ? ?*If you need a refill on your cardiac medications before your next appointment, please call your pharmacy* ? ? ?Lab Work: ?Have lipid and liver profiles checked at [facility] in 3 months.   ?If you have labs (blood work) drawn today and your tests are completely normal, you will receive your results only by: ?MyChart Message (if you have MyChart) OR ?A paper copy in the mail ?If you have any lab test that is abnormal or we need to change your treatment, we will call you to review the results. ? ? ?Testing/Procedures: ?none ? ? ?Follow-Up: ?At [facility], you and your health needs are our priority.  As part of our continuing mission to provide you with exceptional heart care, we have created designated Provider Care Teams.  These Care Teams include your primary Cardiologist (physician) and Advanced Practice Providers (APPs -  Physician Assistants and Nurse Practitioners) who all work together to provide you with the care you need, when you need it. ? ?We recommend signing up for the patient portal called "MyChart".  Sign up information is provided on this After Visit Summary.  MyChart is used to connect with patients for Virtual Visits (Telemedicine).  Patients are able to view lab/test results, encounter notes, upcoming appointments, etc.  Non-urgent messages can be sent to your provider as well.   ?To learn more about what you can do with MyChart, go to [email or web address].   ? ?Your next appointment:   ?12 month(s) ? ?The format for your next appointment:   ?In Person ? ?Provider:   ?Larae Grooms, MD   ? ? ?Other Instructions ?  ? ?Important Information About Sugar ? ? ? ? ? ? ?

## 2021-05-09 DIAGNOSIS — R2681 Unsteadiness on feet: Secondary | ICD-10-CM | POA: Diagnosis not present

## 2021-05-09 DIAGNOSIS — F32A Depression, unspecified: Secondary | ICD-10-CM | POA: Diagnosis not present

## 2021-05-09 DIAGNOSIS — J9601 Acute respiratory failure with hypoxia: Secondary | ICD-10-CM | POA: Diagnosis not present

## 2021-05-09 DIAGNOSIS — I5032 Chronic diastolic (congestive) heart failure: Secondary | ICD-10-CM | POA: Diagnosis not present

## 2021-05-09 DIAGNOSIS — I2510 Atherosclerotic heart disease of native coronary artery without angina pectoris: Secondary | ICD-10-CM | POA: Diagnosis not present

## 2021-05-09 DIAGNOSIS — M6281 Muscle weakness (generalized): Secondary | ICD-10-CM | POA: Diagnosis not present

## 2021-05-09 DIAGNOSIS — N390 Urinary tract infection, site not specified: Secondary | ICD-10-CM | POA: Diagnosis not present

## 2021-05-09 DIAGNOSIS — I10 Essential (primary) hypertension: Secondary | ICD-10-CM | POA: Diagnosis not present

## 2021-05-10 DIAGNOSIS — M6281 Muscle weakness (generalized): Secondary | ICD-10-CM | POA: Diagnosis not present

## 2021-05-10 DIAGNOSIS — J9601 Acute respiratory failure with hypoxia: Secondary | ICD-10-CM | POA: Diagnosis not present

## 2021-05-10 DIAGNOSIS — R2681 Unsteadiness on feet: Secondary | ICD-10-CM | POA: Diagnosis not present

## 2021-05-11 DIAGNOSIS — I10 Essential (primary) hypertension: Secondary | ICD-10-CM | POA: Diagnosis not present

## 2021-05-11 DIAGNOSIS — I5032 Chronic diastolic (congestive) heart failure: Secondary | ICD-10-CM | POA: Diagnosis not present

## 2021-05-11 DIAGNOSIS — I2510 Atherosclerotic heart disease of native coronary artery without angina pectoris: Secondary | ICD-10-CM | POA: Diagnosis not present

## 2021-05-11 DIAGNOSIS — N329 Bladder disorder, unspecified: Secondary | ICD-10-CM | POA: Diagnosis not present

## 2021-05-13 DIAGNOSIS — R2681 Unsteadiness on feet: Secondary | ICD-10-CM | POA: Diagnosis not present

## 2021-05-13 DIAGNOSIS — J9601 Acute respiratory failure with hypoxia: Secondary | ICD-10-CM | POA: Diagnosis not present

## 2021-05-13 DIAGNOSIS — M6281 Muscle weakness (generalized): Secondary | ICD-10-CM | POA: Diagnosis not present

## 2021-05-14 DIAGNOSIS — R2681 Unsteadiness on feet: Secondary | ICD-10-CM | POA: Diagnosis not present

## 2021-05-14 DIAGNOSIS — G894 Chronic pain syndrome: Secondary | ICD-10-CM | POA: Diagnosis not present

## 2021-05-14 DIAGNOSIS — M6281 Muscle weakness (generalized): Secondary | ICD-10-CM | POA: Diagnosis not present

## 2021-05-14 DIAGNOSIS — I10 Essential (primary) hypertension: Secondary | ICD-10-CM | POA: Diagnosis not present

## 2021-05-14 DIAGNOSIS — I5032 Chronic diastolic (congestive) heart failure: Secondary | ICD-10-CM | POA: Diagnosis not present

## 2021-05-14 DIAGNOSIS — F32A Depression, unspecified: Secondary | ICD-10-CM | POA: Diagnosis not present

## 2021-05-14 DIAGNOSIS — J9601 Acute respiratory failure with hypoxia: Secondary | ICD-10-CM | POA: Diagnosis not present

## 2021-05-14 DIAGNOSIS — M549 Dorsalgia, unspecified: Secondary | ICD-10-CM | POA: Diagnosis not present

## 2021-05-15 DIAGNOSIS — M6281 Muscle weakness (generalized): Secondary | ICD-10-CM | POA: Diagnosis not present

## 2021-05-15 DIAGNOSIS — J9601 Acute respiratory failure with hypoxia: Secondary | ICD-10-CM | POA: Diagnosis not present

## 2021-05-15 DIAGNOSIS — R2681 Unsteadiness on feet: Secondary | ICD-10-CM | POA: Diagnosis not present

## 2021-05-16 DIAGNOSIS — M6281 Muscle weakness (generalized): Secondary | ICD-10-CM | POA: Diagnosis not present

## 2021-05-16 DIAGNOSIS — J9601 Acute respiratory failure with hypoxia: Secondary | ICD-10-CM | POA: Diagnosis not present

## 2021-05-16 DIAGNOSIS — R2681 Unsteadiness on feet: Secondary | ICD-10-CM | POA: Diagnosis not present

## 2021-05-17 DIAGNOSIS — R2681 Unsteadiness on feet: Secondary | ICD-10-CM | POA: Diagnosis not present

## 2021-05-17 DIAGNOSIS — J9601 Acute respiratory failure with hypoxia: Secondary | ICD-10-CM | POA: Diagnosis not present

## 2021-05-17 DIAGNOSIS — M6281 Muscle weakness (generalized): Secondary | ICD-10-CM | POA: Diagnosis not present

## 2021-05-18 DIAGNOSIS — M549 Dorsalgia, unspecified: Secondary | ICD-10-CM | POA: Diagnosis not present

## 2021-05-18 DIAGNOSIS — J9601 Acute respiratory failure with hypoxia: Secondary | ICD-10-CM | POA: Diagnosis not present

## 2021-05-18 DIAGNOSIS — G894 Chronic pain syndrome: Secondary | ICD-10-CM | POA: Diagnosis not present

## 2021-05-18 DIAGNOSIS — I5032 Chronic diastolic (congestive) heart failure: Secondary | ICD-10-CM | POA: Diagnosis not present

## 2021-05-18 DIAGNOSIS — R2681 Unsteadiness on feet: Secondary | ICD-10-CM | POA: Diagnosis not present

## 2021-05-18 DIAGNOSIS — G629 Polyneuropathy, unspecified: Secondary | ICD-10-CM | POA: Diagnosis not present

## 2021-05-18 DIAGNOSIS — I10 Essential (primary) hypertension: Secondary | ICD-10-CM | POA: Diagnosis not present

## 2021-05-18 DIAGNOSIS — M6281 Muscle weakness (generalized): Secondary | ICD-10-CM | POA: Diagnosis not present

## 2021-05-18 DIAGNOSIS — F32A Depression, unspecified: Secondary | ICD-10-CM | POA: Diagnosis not present

## 2021-05-21 DIAGNOSIS — M6281 Muscle weakness (generalized): Secondary | ICD-10-CM | POA: Diagnosis not present

## 2021-05-21 DIAGNOSIS — J9601 Acute respiratory failure with hypoxia: Secondary | ICD-10-CM | POA: Diagnosis not present

## 2021-05-21 DIAGNOSIS — R2681 Unsteadiness on feet: Secondary | ICD-10-CM | POA: Diagnosis not present

## 2021-05-22 DIAGNOSIS — J9601 Acute respiratory failure with hypoxia: Secondary | ICD-10-CM | POA: Diagnosis not present

## 2021-05-22 DIAGNOSIS — R2681 Unsteadiness on feet: Secondary | ICD-10-CM | POA: Diagnosis not present

## 2021-05-22 DIAGNOSIS — M6281 Muscle weakness (generalized): Secondary | ICD-10-CM | POA: Diagnosis not present

## 2021-05-23 DIAGNOSIS — R2681 Unsteadiness on feet: Secondary | ICD-10-CM | POA: Diagnosis not present

## 2021-05-23 DIAGNOSIS — J9601 Acute respiratory failure with hypoxia: Secondary | ICD-10-CM | POA: Diagnosis not present

## 2021-05-23 DIAGNOSIS — M6281 Muscle weakness (generalized): Secondary | ICD-10-CM | POA: Diagnosis not present

## 2021-05-24 DIAGNOSIS — J9601 Acute respiratory failure with hypoxia: Secondary | ICD-10-CM | POA: Diagnosis not present

## 2021-05-24 DIAGNOSIS — M6281 Muscle weakness (generalized): Secondary | ICD-10-CM | POA: Diagnosis not present

## 2021-05-24 DIAGNOSIS — R2681 Unsteadiness on feet: Secondary | ICD-10-CM | POA: Diagnosis not present

## 2021-05-25 DIAGNOSIS — I2510 Atherosclerotic heart disease of native coronary artery without angina pectoris: Secondary | ICD-10-CM | POA: Diagnosis not present

## 2021-05-25 DIAGNOSIS — M6281 Muscle weakness (generalized): Secondary | ICD-10-CM | POA: Diagnosis not present

## 2021-05-25 DIAGNOSIS — J9601 Acute respiratory failure with hypoxia: Secondary | ICD-10-CM | POA: Diagnosis not present

## 2021-05-25 DIAGNOSIS — I951 Orthostatic hypotension: Secondary | ICD-10-CM | POA: Diagnosis not present

## 2021-05-25 DIAGNOSIS — R2681 Unsteadiness on feet: Secondary | ICD-10-CM | POA: Diagnosis not present

## 2021-05-25 DIAGNOSIS — N390 Urinary tract infection, site not specified: Secondary | ICD-10-CM | POA: Diagnosis not present

## 2021-05-28 DIAGNOSIS — J9601 Acute respiratory failure with hypoxia: Secondary | ICD-10-CM | POA: Diagnosis not present

## 2021-05-28 DIAGNOSIS — M6281 Muscle weakness (generalized): Secondary | ICD-10-CM | POA: Diagnosis not present

## 2021-05-28 DIAGNOSIS — R2681 Unsteadiness on feet: Secondary | ICD-10-CM | POA: Diagnosis not present

## 2021-05-29 DIAGNOSIS — M6281 Muscle weakness (generalized): Secondary | ICD-10-CM | POA: Diagnosis not present

## 2021-05-29 DIAGNOSIS — J9601 Acute respiratory failure with hypoxia: Secondary | ICD-10-CM | POA: Diagnosis not present

## 2021-05-29 DIAGNOSIS — R2681 Unsteadiness on feet: Secondary | ICD-10-CM | POA: Diagnosis not present

## 2021-05-30 DIAGNOSIS — G629 Polyneuropathy, unspecified: Secondary | ICD-10-CM | POA: Diagnosis not present

## 2021-05-30 DIAGNOSIS — K30 Functional dyspepsia: Secondary | ICD-10-CM | POA: Diagnosis not present

## 2021-05-30 DIAGNOSIS — I10 Essential (primary) hypertension: Secondary | ICD-10-CM | POA: Diagnosis not present

## 2021-05-30 DIAGNOSIS — G894 Chronic pain syndrome: Secondary | ICD-10-CM | POA: Diagnosis not present

## 2021-05-30 DIAGNOSIS — I5032 Chronic diastolic (congestive) heart failure: Secondary | ICD-10-CM | POA: Diagnosis not present

## 2021-05-30 DIAGNOSIS — R2681 Unsteadiness on feet: Secondary | ICD-10-CM | POA: Diagnosis not present

## 2021-05-30 DIAGNOSIS — M6281 Muscle weakness (generalized): Secondary | ICD-10-CM | POA: Diagnosis not present

## 2021-05-30 DIAGNOSIS — J9601 Acute respiratory failure with hypoxia: Secondary | ICD-10-CM | POA: Diagnosis not present

## 2021-05-31 DIAGNOSIS — J9601 Acute respiratory failure with hypoxia: Secondary | ICD-10-CM | POA: Diagnosis not present

## 2021-05-31 DIAGNOSIS — M6281 Muscle weakness (generalized): Secondary | ICD-10-CM | POA: Diagnosis not present

## 2021-05-31 DIAGNOSIS — R2681 Unsteadiness on feet: Secondary | ICD-10-CM | POA: Diagnosis not present

## 2021-06-04 DIAGNOSIS — J9601 Acute respiratory failure with hypoxia: Secondary | ICD-10-CM | POA: Diagnosis not present

## 2021-06-04 DIAGNOSIS — R2681 Unsteadiness on feet: Secondary | ICD-10-CM | POA: Diagnosis not present

## 2021-06-04 DIAGNOSIS — M6281 Muscle weakness (generalized): Secondary | ICD-10-CM | POA: Diagnosis not present

## 2021-06-05 DIAGNOSIS — M6281 Muscle weakness (generalized): Secondary | ICD-10-CM | POA: Diagnosis not present

## 2021-06-05 DIAGNOSIS — R2681 Unsteadiness on feet: Secondary | ICD-10-CM | POA: Diagnosis not present

## 2021-06-05 DIAGNOSIS — J9601 Acute respiratory failure with hypoxia: Secondary | ICD-10-CM | POA: Diagnosis not present

## 2021-06-06 DIAGNOSIS — M7021 Olecranon bursitis, right elbow: Secondary | ICD-10-CM | POA: Diagnosis not present

## 2021-06-06 DIAGNOSIS — I10 Essential (primary) hypertension: Secondary | ICD-10-CM | POA: Diagnosis not present

## 2021-06-06 DIAGNOSIS — F32A Depression, unspecified: Secondary | ICD-10-CM | POA: Diagnosis not present

## 2021-06-06 DIAGNOSIS — J9601 Acute respiratory failure with hypoxia: Secondary | ICD-10-CM | POA: Diagnosis not present

## 2021-06-06 DIAGNOSIS — D649 Anemia, unspecified: Secondary | ICD-10-CM | POA: Diagnosis not present

## 2021-06-06 DIAGNOSIS — R2681 Unsteadiness on feet: Secondary | ICD-10-CM | POA: Diagnosis not present

## 2021-06-06 DIAGNOSIS — M6281 Muscle weakness (generalized): Secondary | ICD-10-CM | POA: Diagnosis not present

## 2021-06-06 DIAGNOSIS — G894 Chronic pain syndrome: Secondary | ICD-10-CM | POA: Diagnosis not present

## 2021-06-07 DIAGNOSIS — N390 Urinary tract infection, site not specified: Secondary | ICD-10-CM | POA: Diagnosis not present

## 2021-06-08 DIAGNOSIS — G629 Polyneuropathy, unspecified: Secondary | ICD-10-CM | POA: Diagnosis not present

## 2021-06-08 DIAGNOSIS — M7021 Olecranon bursitis, right elbow: Secondary | ICD-10-CM | POA: Diagnosis not present

## 2021-06-08 DIAGNOSIS — I5032 Chronic diastolic (congestive) heart failure: Secondary | ICD-10-CM | POA: Diagnosis not present

## 2021-06-08 DIAGNOSIS — G894 Chronic pain syndrome: Secondary | ICD-10-CM | POA: Diagnosis not present

## 2021-06-08 DIAGNOSIS — I10 Essential (primary) hypertension: Secondary | ICD-10-CM | POA: Diagnosis not present

## 2021-06-13 DIAGNOSIS — I5032 Chronic diastolic (congestive) heart failure: Secondary | ICD-10-CM | POA: Diagnosis not present

## 2021-06-13 DIAGNOSIS — F32A Depression, unspecified: Secondary | ICD-10-CM | POA: Diagnosis not present

## 2021-06-13 DIAGNOSIS — M7021 Olecranon bursitis, right elbow: Secondary | ICD-10-CM | POA: Diagnosis not present

## 2021-06-13 DIAGNOSIS — G894 Chronic pain syndrome: Secondary | ICD-10-CM | POA: Diagnosis not present

## 2021-06-13 DIAGNOSIS — I10 Essential (primary) hypertension: Secondary | ICD-10-CM | POA: Diagnosis not present

## 2021-06-18 DIAGNOSIS — J9601 Acute respiratory failure with hypoxia: Secondary | ICD-10-CM | POA: Diagnosis not present

## 2021-06-18 DIAGNOSIS — R2681 Unsteadiness on feet: Secondary | ICD-10-CM | POA: Diagnosis not present

## 2021-06-18 DIAGNOSIS — M6281 Muscle weakness (generalized): Secondary | ICD-10-CM | POA: Diagnosis not present

## 2021-06-19 DIAGNOSIS — M6281 Muscle weakness (generalized): Secondary | ICD-10-CM | POA: Diagnosis not present

## 2021-06-19 DIAGNOSIS — J9601 Acute respiratory failure with hypoxia: Secondary | ICD-10-CM | POA: Diagnosis not present

## 2021-06-19 DIAGNOSIS — R2681 Unsteadiness on feet: Secondary | ICD-10-CM | POA: Diagnosis not present

## 2021-06-20 DIAGNOSIS — M6281 Muscle weakness (generalized): Secondary | ICD-10-CM | POA: Diagnosis not present

## 2021-06-20 DIAGNOSIS — J9601 Acute respiratory failure with hypoxia: Secondary | ICD-10-CM | POA: Diagnosis not present

## 2021-06-20 DIAGNOSIS — R2681 Unsteadiness on feet: Secondary | ICD-10-CM | POA: Diagnosis not present

## 2021-06-21 DIAGNOSIS — M6281 Muscle weakness (generalized): Secondary | ICD-10-CM | POA: Diagnosis not present

## 2021-06-21 DIAGNOSIS — R2681 Unsteadiness on feet: Secondary | ICD-10-CM | POA: Diagnosis not present

## 2021-06-21 DIAGNOSIS — J9601 Acute respiratory failure with hypoxia: Secondary | ICD-10-CM | POA: Diagnosis not present

## 2021-06-22 DIAGNOSIS — R2681 Unsteadiness on feet: Secondary | ICD-10-CM | POA: Diagnosis not present

## 2021-06-22 DIAGNOSIS — J9601 Acute respiratory failure with hypoxia: Secondary | ICD-10-CM | POA: Diagnosis not present

## 2021-06-22 DIAGNOSIS — M6281 Muscle weakness (generalized): Secondary | ICD-10-CM | POA: Diagnosis not present

## 2021-06-25 DIAGNOSIS — R2681 Unsteadiness on feet: Secondary | ICD-10-CM | POA: Diagnosis not present

## 2021-06-25 DIAGNOSIS — J9601 Acute respiratory failure with hypoxia: Secondary | ICD-10-CM | POA: Diagnosis not present

## 2021-06-25 DIAGNOSIS — M6281 Muscle weakness (generalized): Secondary | ICD-10-CM | POA: Diagnosis not present

## 2021-06-26 DIAGNOSIS — R2681 Unsteadiness on feet: Secondary | ICD-10-CM | POA: Diagnosis not present

## 2021-06-26 DIAGNOSIS — J9601 Acute respiratory failure with hypoxia: Secondary | ICD-10-CM | POA: Diagnosis not present

## 2021-06-26 DIAGNOSIS — M6281 Muscle weakness (generalized): Secondary | ICD-10-CM | POA: Diagnosis not present

## 2021-06-27 DIAGNOSIS — M6281 Muscle weakness (generalized): Secondary | ICD-10-CM | POA: Diagnosis not present

## 2021-06-27 DIAGNOSIS — R2681 Unsteadiness on feet: Secondary | ICD-10-CM | POA: Diagnosis not present

## 2021-06-27 DIAGNOSIS — J9601 Acute respiratory failure with hypoxia: Secondary | ICD-10-CM | POA: Diagnosis not present

## 2021-06-28 DIAGNOSIS — I5032 Chronic diastolic (congestive) heart failure: Secondary | ICD-10-CM | POA: Diagnosis not present

## 2021-06-28 DIAGNOSIS — G894 Chronic pain syndrome: Secondary | ICD-10-CM | POA: Diagnosis not present

## 2021-06-28 DIAGNOSIS — I10 Essential (primary) hypertension: Secondary | ICD-10-CM | POA: Diagnosis not present

## 2021-06-28 DIAGNOSIS — J9601 Acute respiratory failure with hypoxia: Secondary | ICD-10-CM | POA: Diagnosis not present

## 2021-06-28 DIAGNOSIS — M6281 Muscle weakness (generalized): Secondary | ICD-10-CM | POA: Diagnosis not present

## 2021-06-28 DIAGNOSIS — R2681 Unsteadiness on feet: Secondary | ICD-10-CM | POA: Diagnosis not present

## 2021-06-28 DIAGNOSIS — G629 Polyneuropathy, unspecified: Secondary | ICD-10-CM | POA: Diagnosis not present

## 2021-06-29 DIAGNOSIS — I2510 Atherosclerotic heart disease of native coronary artery without angina pectoris: Secondary | ICD-10-CM | POA: Diagnosis not present

## 2021-06-29 DIAGNOSIS — I951 Orthostatic hypotension: Secondary | ICD-10-CM | POA: Diagnosis not present

## 2021-06-29 DIAGNOSIS — N390 Urinary tract infection, site not specified: Secondary | ICD-10-CM | POA: Diagnosis not present

## 2021-06-30 DIAGNOSIS — R2681 Unsteadiness on feet: Secondary | ICD-10-CM | POA: Diagnosis not present

## 2021-06-30 DIAGNOSIS — J9601 Acute respiratory failure with hypoxia: Secondary | ICD-10-CM | POA: Diagnosis not present

## 2021-06-30 DIAGNOSIS — M6281 Muscle weakness (generalized): Secondary | ICD-10-CM | POA: Diagnosis not present

## 2021-07-02 DIAGNOSIS — J9601 Acute respiratory failure with hypoxia: Secondary | ICD-10-CM | POA: Diagnosis not present

## 2021-07-02 DIAGNOSIS — R2681 Unsteadiness on feet: Secondary | ICD-10-CM | POA: Diagnosis not present

## 2021-07-02 DIAGNOSIS — M6281 Muscle weakness (generalized): Secondary | ICD-10-CM | POA: Diagnosis not present

## 2021-07-03 DIAGNOSIS — M7021 Olecranon bursitis, right elbow: Secondary | ICD-10-CM | POA: Diagnosis not present

## 2021-07-03 DIAGNOSIS — G894 Chronic pain syndrome: Secondary | ICD-10-CM | POA: Diagnosis not present

## 2021-07-03 DIAGNOSIS — F32A Depression, unspecified: Secondary | ICD-10-CM | POA: Diagnosis not present

## 2021-07-03 DIAGNOSIS — I10 Essential (primary) hypertension: Secondary | ICD-10-CM | POA: Diagnosis not present

## 2021-07-03 DIAGNOSIS — J9601 Acute respiratory failure with hypoxia: Secondary | ICD-10-CM | POA: Diagnosis not present

## 2021-07-03 DIAGNOSIS — M549 Dorsalgia, unspecified: Secondary | ICD-10-CM | POA: Diagnosis not present

## 2021-07-03 DIAGNOSIS — M6281 Muscle weakness (generalized): Secondary | ICD-10-CM | POA: Diagnosis not present

## 2021-07-03 DIAGNOSIS — R2681 Unsteadiness on feet: Secondary | ICD-10-CM | POA: Diagnosis not present

## 2021-07-04 DIAGNOSIS — M6281 Muscle weakness (generalized): Secondary | ICD-10-CM | POA: Diagnosis not present

## 2021-07-04 DIAGNOSIS — J9601 Acute respiratory failure with hypoxia: Secondary | ICD-10-CM | POA: Diagnosis not present

## 2021-07-04 DIAGNOSIS — R2681 Unsteadiness on feet: Secondary | ICD-10-CM | POA: Diagnosis not present

## 2021-07-05 DIAGNOSIS — R2681 Unsteadiness on feet: Secondary | ICD-10-CM | POA: Diagnosis not present

## 2021-07-05 DIAGNOSIS — M6281 Muscle weakness (generalized): Secondary | ICD-10-CM | POA: Diagnosis not present

## 2021-07-05 DIAGNOSIS — G894 Chronic pain syndrome: Secondary | ICD-10-CM | POA: Diagnosis not present

## 2021-07-05 DIAGNOSIS — K30 Functional dyspepsia: Secondary | ICD-10-CM | POA: Diagnosis not present

## 2021-07-05 DIAGNOSIS — I5032 Chronic diastolic (congestive) heart failure: Secondary | ICD-10-CM | POA: Diagnosis not present

## 2021-07-05 DIAGNOSIS — I10 Essential (primary) hypertension: Secondary | ICD-10-CM | POA: Diagnosis not present

## 2021-07-05 DIAGNOSIS — J9601 Acute respiratory failure with hypoxia: Secondary | ICD-10-CM | POA: Diagnosis not present

## 2021-07-06 DIAGNOSIS — M6281 Muscle weakness (generalized): Secondary | ICD-10-CM | POA: Diagnosis not present

## 2021-07-06 DIAGNOSIS — R2681 Unsteadiness on feet: Secondary | ICD-10-CM | POA: Diagnosis not present

## 2021-07-06 DIAGNOSIS — J9601 Acute respiratory failure with hypoxia: Secondary | ICD-10-CM | POA: Diagnosis not present

## 2021-07-09 DIAGNOSIS — R2681 Unsteadiness on feet: Secondary | ICD-10-CM | POA: Diagnosis not present

## 2021-07-09 DIAGNOSIS — J9601 Acute respiratory failure with hypoxia: Secondary | ICD-10-CM | POA: Diagnosis not present

## 2021-07-09 DIAGNOSIS — M6281 Muscle weakness (generalized): Secondary | ICD-10-CM | POA: Diagnosis not present

## 2021-07-10 DIAGNOSIS — J9601 Acute respiratory failure with hypoxia: Secondary | ICD-10-CM | POA: Diagnosis not present

## 2021-07-10 DIAGNOSIS — R2681 Unsteadiness on feet: Secondary | ICD-10-CM | POA: Diagnosis not present

## 2021-07-10 DIAGNOSIS — M6281 Muscle weakness (generalized): Secondary | ICD-10-CM | POA: Diagnosis not present

## 2021-07-11 DIAGNOSIS — R2681 Unsteadiness on feet: Secondary | ICD-10-CM | POA: Diagnosis not present

## 2021-07-11 DIAGNOSIS — M6281 Muscle weakness (generalized): Secondary | ICD-10-CM | POA: Diagnosis not present

## 2021-07-11 DIAGNOSIS — J9601 Acute respiratory failure with hypoxia: Secondary | ICD-10-CM | POA: Diagnosis not present

## 2021-07-12 DIAGNOSIS — I5032 Chronic diastolic (congestive) heart failure: Secondary | ICD-10-CM | POA: Diagnosis not present

## 2021-07-12 DIAGNOSIS — N3281 Overactive bladder: Secondary | ICD-10-CM | POA: Diagnosis not present

## 2021-07-12 DIAGNOSIS — I10 Essential (primary) hypertension: Secondary | ICD-10-CM | POA: Diagnosis not present

## 2021-07-12 DIAGNOSIS — M6281 Muscle weakness (generalized): Secondary | ICD-10-CM | POA: Diagnosis not present

## 2021-07-12 DIAGNOSIS — R300 Dysuria: Secondary | ICD-10-CM | POA: Diagnosis not present

## 2021-07-12 DIAGNOSIS — G894 Chronic pain syndrome: Secondary | ICD-10-CM | POA: Diagnosis not present

## 2021-07-12 DIAGNOSIS — J9601 Acute respiratory failure with hypoxia: Secondary | ICD-10-CM | POA: Diagnosis not present

## 2021-07-12 DIAGNOSIS — R2681 Unsteadiness on feet: Secondary | ICD-10-CM | POA: Diagnosis not present

## 2021-07-13 DIAGNOSIS — M6281 Muscle weakness (generalized): Secondary | ICD-10-CM | POA: Diagnosis not present

## 2021-07-13 DIAGNOSIS — N390 Urinary tract infection, site not specified: Secondary | ICD-10-CM | POA: Diagnosis not present

## 2021-07-13 DIAGNOSIS — J9601 Acute respiratory failure with hypoxia: Secondary | ICD-10-CM | POA: Diagnosis not present

## 2021-07-13 DIAGNOSIS — I10 Essential (primary) hypertension: Secondary | ICD-10-CM | POA: Diagnosis not present

## 2021-07-13 DIAGNOSIS — R2681 Unsteadiness on feet: Secondary | ICD-10-CM | POA: Diagnosis not present

## 2021-07-16 DIAGNOSIS — J9601 Acute respiratory failure with hypoxia: Secondary | ICD-10-CM | POA: Diagnosis not present

## 2021-07-16 DIAGNOSIS — M6281 Muscle weakness (generalized): Secondary | ICD-10-CM | POA: Diagnosis not present

## 2021-07-16 DIAGNOSIS — I5032 Chronic diastolic (congestive) heart failure: Secondary | ICD-10-CM | POA: Diagnosis not present

## 2021-07-16 DIAGNOSIS — N182 Chronic kidney disease, stage 2 (mild): Secondary | ICD-10-CM | POA: Diagnosis not present

## 2021-07-16 DIAGNOSIS — I10 Essential (primary) hypertension: Secondary | ICD-10-CM | POA: Diagnosis not present

## 2021-07-16 DIAGNOSIS — G894 Chronic pain syndrome: Secondary | ICD-10-CM | POA: Diagnosis not present

## 2021-07-16 DIAGNOSIS — N390 Urinary tract infection, site not specified: Secondary | ICD-10-CM | POA: Diagnosis not present

## 2021-07-16 DIAGNOSIS — R2681 Unsteadiness on feet: Secondary | ICD-10-CM | POA: Diagnosis not present

## 2021-07-17 DIAGNOSIS — E119 Type 2 diabetes mellitus without complications: Secondary | ICD-10-CM | POA: Diagnosis not present

## 2021-07-17 DIAGNOSIS — M6281 Muscle weakness (generalized): Secondary | ICD-10-CM | POA: Diagnosis not present

## 2021-07-17 DIAGNOSIS — J9601 Acute respiratory failure with hypoxia: Secondary | ICD-10-CM | POA: Diagnosis not present

## 2021-07-17 DIAGNOSIS — R2681 Unsteadiness on feet: Secondary | ICD-10-CM | POA: Diagnosis not present

## 2021-07-18 DIAGNOSIS — M6281 Muscle weakness (generalized): Secondary | ICD-10-CM | POA: Diagnosis not present

## 2021-07-18 DIAGNOSIS — R2681 Unsteadiness on feet: Secondary | ICD-10-CM | POA: Diagnosis not present

## 2021-07-18 DIAGNOSIS — J9601 Acute respiratory failure with hypoxia: Secondary | ICD-10-CM | POA: Diagnosis not present

## 2021-07-19 DIAGNOSIS — J9601 Acute respiratory failure with hypoxia: Secondary | ICD-10-CM | POA: Diagnosis not present

## 2021-07-19 DIAGNOSIS — R2681 Unsteadiness on feet: Secondary | ICD-10-CM | POA: Diagnosis not present

## 2021-07-19 DIAGNOSIS — G894 Chronic pain syndrome: Secondary | ICD-10-CM | POA: Diagnosis not present

## 2021-07-19 DIAGNOSIS — I10 Essential (primary) hypertension: Secondary | ICD-10-CM | POA: Diagnosis not present

## 2021-07-19 DIAGNOSIS — M6281 Muscle weakness (generalized): Secondary | ICD-10-CM | POA: Diagnosis not present

## 2021-07-19 DIAGNOSIS — F32A Depression, unspecified: Secondary | ICD-10-CM | POA: Diagnosis not present

## 2021-07-19 DIAGNOSIS — E119 Type 2 diabetes mellitus without complications: Secondary | ICD-10-CM | POA: Diagnosis not present

## 2021-07-23 DIAGNOSIS — R2681 Unsteadiness on feet: Secondary | ICD-10-CM | POA: Diagnosis not present

## 2021-07-23 DIAGNOSIS — J9601 Acute respiratory failure with hypoxia: Secondary | ICD-10-CM | POA: Diagnosis not present

## 2021-07-23 DIAGNOSIS — M6281 Muscle weakness (generalized): Secondary | ICD-10-CM | POA: Diagnosis not present

## 2021-07-24 DIAGNOSIS — J9601 Acute respiratory failure with hypoxia: Secondary | ICD-10-CM | POA: Diagnosis not present

## 2021-07-24 DIAGNOSIS — M6281 Muscle weakness (generalized): Secondary | ICD-10-CM | POA: Diagnosis not present

## 2021-07-24 DIAGNOSIS — R2681 Unsteadiness on feet: Secondary | ICD-10-CM | POA: Diagnosis not present

## 2021-07-25 DIAGNOSIS — J9601 Acute respiratory failure with hypoxia: Secondary | ICD-10-CM | POA: Diagnosis not present

## 2021-07-25 DIAGNOSIS — R2681 Unsteadiness on feet: Secondary | ICD-10-CM | POA: Diagnosis not present

## 2021-07-25 DIAGNOSIS — M6281 Muscle weakness (generalized): Secondary | ICD-10-CM | POA: Diagnosis not present

## 2021-07-26 DIAGNOSIS — J9601 Acute respiratory failure with hypoxia: Secondary | ICD-10-CM | POA: Diagnosis not present

## 2021-07-26 DIAGNOSIS — I10 Essential (primary) hypertension: Secondary | ICD-10-CM | POA: Diagnosis not present

## 2021-07-26 DIAGNOSIS — M6281 Muscle weakness (generalized): Secondary | ICD-10-CM | POA: Diagnosis not present

## 2021-07-26 DIAGNOSIS — R2681 Unsteadiness on feet: Secondary | ICD-10-CM | POA: Diagnosis not present

## 2021-07-26 DIAGNOSIS — I5032 Chronic diastolic (congestive) heart failure: Secondary | ICD-10-CM | POA: Diagnosis not present

## 2021-07-26 DIAGNOSIS — F32A Depression, unspecified: Secondary | ICD-10-CM | POA: Diagnosis not present

## 2021-07-26 DIAGNOSIS — G894 Chronic pain syndrome: Secondary | ICD-10-CM | POA: Diagnosis not present

## 2021-07-27 DIAGNOSIS — I951 Orthostatic hypotension: Secondary | ICD-10-CM | POA: Diagnosis not present

## 2021-07-27 DIAGNOSIS — J9601 Acute respiratory failure with hypoxia: Secondary | ICD-10-CM | POA: Diagnosis not present

## 2021-07-27 DIAGNOSIS — I2510 Atherosclerotic heart disease of native coronary artery without angina pectoris: Secondary | ICD-10-CM | POA: Diagnosis not present

## 2021-07-27 DIAGNOSIS — M6281 Muscle weakness (generalized): Secondary | ICD-10-CM | POA: Diagnosis not present

## 2021-07-27 DIAGNOSIS — N390 Urinary tract infection, site not specified: Secondary | ICD-10-CM | POA: Diagnosis not present

## 2021-07-27 DIAGNOSIS — R2681 Unsteadiness on feet: Secondary | ICD-10-CM | POA: Diagnosis not present

## 2021-07-30 DIAGNOSIS — R2681 Unsteadiness on feet: Secondary | ICD-10-CM | POA: Diagnosis not present

## 2021-07-30 DIAGNOSIS — M6281 Muscle weakness (generalized): Secondary | ICD-10-CM | POA: Diagnosis not present

## 2021-07-30 DIAGNOSIS — J9601 Acute respiratory failure with hypoxia: Secondary | ICD-10-CM | POA: Diagnosis not present

## 2021-07-31 DIAGNOSIS — F32A Depression, unspecified: Secondary | ICD-10-CM | POA: Diagnosis not present

## 2021-07-31 DIAGNOSIS — J9601 Acute respiratory failure with hypoxia: Secondary | ICD-10-CM | POA: Diagnosis not present

## 2021-07-31 DIAGNOSIS — I5032 Chronic diastolic (congestive) heart failure: Secondary | ICD-10-CM | POA: Diagnosis not present

## 2021-07-31 DIAGNOSIS — I10 Essential (primary) hypertension: Secondary | ICD-10-CM | POA: Diagnosis not present

## 2021-07-31 DIAGNOSIS — G894 Chronic pain syndrome: Secondary | ICD-10-CM | POA: Diagnosis not present

## 2021-07-31 DIAGNOSIS — M6281 Muscle weakness (generalized): Secondary | ICD-10-CM | POA: Diagnosis not present

## 2021-07-31 DIAGNOSIS — R2681 Unsteadiness on feet: Secondary | ICD-10-CM | POA: Diagnosis not present

## 2021-08-01 DIAGNOSIS — R2681 Unsteadiness on feet: Secondary | ICD-10-CM | POA: Diagnosis not present

## 2021-08-01 DIAGNOSIS — M6281 Muscle weakness (generalized): Secondary | ICD-10-CM | POA: Diagnosis not present

## 2021-08-01 DIAGNOSIS — J9601 Acute respiratory failure with hypoxia: Secondary | ICD-10-CM | POA: Diagnosis not present

## 2021-08-02 DIAGNOSIS — M6281 Muscle weakness (generalized): Secondary | ICD-10-CM | POA: Diagnosis not present

## 2021-08-02 DIAGNOSIS — R2681 Unsteadiness on feet: Secondary | ICD-10-CM | POA: Diagnosis not present

## 2021-08-02 DIAGNOSIS — J9601 Acute respiratory failure with hypoxia: Secondary | ICD-10-CM | POA: Diagnosis not present

## 2021-08-07 DIAGNOSIS — F32A Depression, unspecified: Secondary | ICD-10-CM | POA: Diagnosis not present

## 2021-08-07 DIAGNOSIS — R2681 Unsteadiness on feet: Secondary | ICD-10-CM | POA: Diagnosis not present

## 2021-08-07 DIAGNOSIS — I5032 Chronic diastolic (congestive) heart failure: Secondary | ICD-10-CM | POA: Diagnosis not present

## 2021-08-07 DIAGNOSIS — M6281 Muscle weakness (generalized): Secondary | ICD-10-CM | POA: Diagnosis not present

## 2021-08-07 DIAGNOSIS — R1312 Dysphagia, oropharyngeal phase: Secondary | ICD-10-CM | POA: Diagnosis not present

## 2021-08-07 DIAGNOSIS — I10 Essential (primary) hypertension: Secondary | ICD-10-CM | POA: Diagnosis not present

## 2021-08-07 DIAGNOSIS — G894 Chronic pain syndrome: Secondary | ICD-10-CM | POA: Diagnosis not present

## 2021-08-07 DIAGNOSIS — J9601 Acute respiratory failure with hypoxia: Secondary | ICD-10-CM | POA: Diagnosis not present

## 2021-08-08 DIAGNOSIS — K769 Liver disease, unspecified: Secondary | ICD-10-CM | POA: Diagnosis not present

## 2021-08-08 DIAGNOSIS — J9601 Acute respiratory failure with hypoxia: Secondary | ICD-10-CM | POA: Diagnosis not present

## 2021-08-08 DIAGNOSIS — R2681 Unsteadiness on feet: Secondary | ICD-10-CM | POA: Diagnosis not present

## 2021-08-08 DIAGNOSIS — M6281 Muscle weakness (generalized): Secondary | ICD-10-CM | POA: Diagnosis not present

## 2021-08-08 DIAGNOSIS — R1312 Dysphagia, oropharyngeal phase: Secondary | ICD-10-CM | POA: Diagnosis not present

## 2021-08-10 DIAGNOSIS — R1312 Dysphagia, oropharyngeal phase: Secondary | ICD-10-CM | POA: Diagnosis not present

## 2021-08-10 DIAGNOSIS — M6281 Muscle weakness (generalized): Secondary | ICD-10-CM | POA: Diagnosis not present

## 2021-08-10 DIAGNOSIS — J9601 Acute respiratory failure with hypoxia: Secondary | ICD-10-CM | POA: Diagnosis not present

## 2021-08-10 DIAGNOSIS — G894 Chronic pain syndrome: Secondary | ICD-10-CM | POA: Diagnosis not present

## 2021-08-10 DIAGNOSIS — I5032 Chronic diastolic (congestive) heart failure: Secondary | ICD-10-CM | POA: Diagnosis not present

## 2021-08-10 DIAGNOSIS — I10 Essential (primary) hypertension: Secondary | ICD-10-CM | POA: Diagnosis not present

## 2021-08-10 DIAGNOSIS — R2681 Unsteadiness on feet: Secondary | ICD-10-CM | POA: Diagnosis not present

## 2021-08-10 DIAGNOSIS — F32A Depression, unspecified: Secondary | ICD-10-CM | POA: Diagnosis not present

## 2021-08-13 DIAGNOSIS — I5032 Chronic diastolic (congestive) heart failure: Secondary | ICD-10-CM | POA: Diagnosis not present

## 2021-08-13 DIAGNOSIS — I10 Essential (primary) hypertension: Secondary | ICD-10-CM | POA: Diagnosis not present

## 2021-08-13 DIAGNOSIS — E785 Hyperlipidemia, unspecified: Secondary | ICD-10-CM | POA: Diagnosis not present

## 2021-08-13 DIAGNOSIS — G894 Chronic pain syndrome: Secondary | ICD-10-CM | POA: Diagnosis not present

## 2021-08-14 DIAGNOSIS — R2681 Unsteadiness on feet: Secondary | ICD-10-CM | POA: Diagnosis not present

## 2021-08-14 DIAGNOSIS — F32A Depression, unspecified: Secondary | ICD-10-CM | POA: Diagnosis not present

## 2021-08-14 DIAGNOSIS — G629 Polyneuropathy, unspecified: Secondary | ICD-10-CM | POA: Diagnosis not present

## 2021-08-14 DIAGNOSIS — I5032 Chronic diastolic (congestive) heart failure: Secondary | ICD-10-CM | POA: Diagnosis not present

## 2021-08-14 DIAGNOSIS — M6281 Muscle weakness (generalized): Secondary | ICD-10-CM | POA: Diagnosis not present

## 2021-08-14 DIAGNOSIS — G894 Chronic pain syndrome: Secondary | ICD-10-CM | POA: Diagnosis not present

## 2021-08-14 DIAGNOSIS — I10 Essential (primary) hypertension: Secondary | ICD-10-CM | POA: Diagnosis not present

## 2021-08-14 DIAGNOSIS — R1312 Dysphagia, oropharyngeal phase: Secondary | ICD-10-CM | POA: Diagnosis not present

## 2021-08-14 DIAGNOSIS — J9601 Acute respiratory failure with hypoxia: Secondary | ICD-10-CM | POA: Diagnosis not present

## 2021-08-15 DIAGNOSIS — R2681 Unsteadiness on feet: Secondary | ICD-10-CM | POA: Diagnosis not present

## 2021-08-15 DIAGNOSIS — M6281 Muscle weakness (generalized): Secondary | ICD-10-CM | POA: Diagnosis not present

## 2021-08-15 DIAGNOSIS — J9601 Acute respiratory failure with hypoxia: Secondary | ICD-10-CM | POA: Diagnosis not present

## 2021-08-15 DIAGNOSIS — R1312 Dysphagia, oropharyngeal phase: Secondary | ICD-10-CM | POA: Diagnosis not present

## 2021-08-16 DIAGNOSIS — J9601 Acute respiratory failure with hypoxia: Secondary | ICD-10-CM | POA: Diagnosis not present

## 2021-08-16 DIAGNOSIS — R2681 Unsteadiness on feet: Secondary | ICD-10-CM | POA: Diagnosis not present

## 2021-08-16 DIAGNOSIS — R1312 Dysphagia, oropharyngeal phase: Secondary | ICD-10-CM | POA: Diagnosis not present

## 2021-08-16 DIAGNOSIS — M6281 Muscle weakness (generalized): Secondary | ICD-10-CM | POA: Diagnosis not present

## 2021-08-18 DIAGNOSIS — M6281 Muscle weakness (generalized): Secondary | ICD-10-CM | POA: Diagnosis not present

## 2021-08-18 DIAGNOSIS — R2681 Unsteadiness on feet: Secondary | ICD-10-CM | POA: Diagnosis not present

## 2021-08-18 DIAGNOSIS — J9601 Acute respiratory failure with hypoxia: Secondary | ICD-10-CM | POA: Diagnosis not present

## 2021-08-18 DIAGNOSIS — R1312 Dysphagia, oropharyngeal phase: Secondary | ICD-10-CM | POA: Diagnosis not present

## 2021-08-19 DIAGNOSIS — R2681 Unsteadiness on feet: Secondary | ICD-10-CM | POA: Diagnosis not present

## 2021-08-19 DIAGNOSIS — J9601 Acute respiratory failure with hypoxia: Secondary | ICD-10-CM | POA: Diagnosis not present

## 2021-08-19 DIAGNOSIS — M6281 Muscle weakness (generalized): Secondary | ICD-10-CM | POA: Diagnosis not present

## 2021-08-19 DIAGNOSIS — R1312 Dysphagia, oropharyngeal phase: Secondary | ICD-10-CM | POA: Diagnosis not present

## 2021-08-20 DIAGNOSIS — M6281 Muscle weakness (generalized): Secondary | ICD-10-CM | POA: Diagnosis not present

## 2021-08-20 DIAGNOSIS — R1312 Dysphagia, oropharyngeal phase: Secondary | ICD-10-CM | POA: Diagnosis not present

## 2021-08-20 DIAGNOSIS — R2681 Unsteadiness on feet: Secondary | ICD-10-CM | POA: Diagnosis not present

## 2021-08-20 DIAGNOSIS — J9601 Acute respiratory failure with hypoxia: Secondary | ICD-10-CM | POA: Diagnosis not present

## 2021-08-21 DIAGNOSIS — R2681 Unsteadiness on feet: Secondary | ICD-10-CM | POA: Diagnosis not present

## 2021-08-21 DIAGNOSIS — J9601 Acute respiratory failure with hypoxia: Secondary | ICD-10-CM | POA: Diagnosis not present

## 2021-08-21 DIAGNOSIS — R1312 Dysphagia, oropharyngeal phase: Secondary | ICD-10-CM | POA: Diagnosis not present

## 2021-08-21 DIAGNOSIS — M6281 Muscle weakness (generalized): Secondary | ICD-10-CM | POA: Diagnosis not present

## 2021-08-24 DIAGNOSIS — I2510 Atherosclerotic heart disease of native coronary artery without angina pectoris: Secondary | ICD-10-CM | POA: Diagnosis not present

## 2021-08-24 DIAGNOSIS — I272 Pulmonary hypertension, unspecified: Secondary | ICD-10-CM | POA: Diagnosis not present

## 2021-08-24 DIAGNOSIS — I5030 Unspecified diastolic (congestive) heart failure: Secondary | ICD-10-CM | POA: Diagnosis not present

## 2021-08-24 DIAGNOSIS — A419 Sepsis, unspecified organism: Secondary | ICD-10-CM | POA: Diagnosis not present

## 2021-08-24 DIAGNOSIS — I639 Cerebral infarction, unspecified: Secondary | ICD-10-CM | POA: Diagnosis not present

## 2021-08-24 DIAGNOSIS — I10 Essential (primary) hypertension: Secondary | ICD-10-CM | POA: Diagnosis not present

## 2021-08-24 DIAGNOSIS — I951 Orthostatic hypotension: Secondary | ICD-10-CM | POA: Diagnosis not present

## 2021-08-24 DIAGNOSIS — E1122 Type 2 diabetes mellitus with diabetic chronic kidney disease: Secondary | ICD-10-CM | POA: Diagnosis not present

## 2021-08-24 DIAGNOSIS — N390 Urinary tract infection, site not specified: Secondary | ICD-10-CM | POA: Diagnosis not present

## 2021-08-26 DIAGNOSIS — R1312 Dysphagia, oropharyngeal phase: Secondary | ICD-10-CM | POA: Diagnosis not present

## 2021-08-26 DIAGNOSIS — R2681 Unsteadiness on feet: Secondary | ICD-10-CM | POA: Diagnosis not present

## 2021-08-26 DIAGNOSIS — M6281 Muscle weakness (generalized): Secondary | ICD-10-CM | POA: Diagnosis not present

## 2021-08-26 DIAGNOSIS — J9601 Acute respiratory failure with hypoxia: Secondary | ICD-10-CM | POA: Diagnosis not present

## 2021-08-28 DIAGNOSIS — R1312 Dysphagia, oropharyngeal phase: Secondary | ICD-10-CM | POA: Diagnosis not present

## 2021-08-28 DIAGNOSIS — J9601 Acute respiratory failure with hypoxia: Secondary | ICD-10-CM | POA: Diagnosis not present

## 2021-08-28 DIAGNOSIS — M6281 Muscle weakness (generalized): Secondary | ICD-10-CM | POA: Diagnosis not present

## 2021-08-28 DIAGNOSIS — R2681 Unsteadiness on feet: Secondary | ICD-10-CM | POA: Diagnosis not present

## 2021-08-29 DIAGNOSIS — R2681 Unsteadiness on feet: Secondary | ICD-10-CM | POA: Diagnosis not present

## 2021-08-29 DIAGNOSIS — G894 Chronic pain syndrome: Secondary | ICD-10-CM | POA: Diagnosis not present

## 2021-08-29 DIAGNOSIS — R1312 Dysphagia, oropharyngeal phase: Secondary | ICD-10-CM | POA: Diagnosis not present

## 2021-08-29 DIAGNOSIS — F32A Depression, unspecified: Secondary | ICD-10-CM | POA: Diagnosis not present

## 2021-08-29 DIAGNOSIS — M6281 Muscle weakness (generalized): Secondary | ICD-10-CM | POA: Diagnosis not present

## 2021-08-29 DIAGNOSIS — I10 Essential (primary) hypertension: Secondary | ICD-10-CM | POA: Diagnosis not present

## 2021-08-29 DIAGNOSIS — J9601 Acute respiratory failure with hypoxia: Secondary | ICD-10-CM | POA: Diagnosis not present

## 2021-08-29 DIAGNOSIS — I5032 Chronic diastolic (congestive) heart failure: Secondary | ICD-10-CM | POA: Diagnosis not present

## 2021-08-29 DIAGNOSIS — M549 Dorsalgia, unspecified: Secondary | ICD-10-CM | POA: Diagnosis not present

## 2021-08-31 DIAGNOSIS — M6281 Muscle weakness (generalized): Secondary | ICD-10-CM | POA: Diagnosis not present

## 2021-08-31 DIAGNOSIS — R2681 Unsteadiness on feet: Secondary | ICD-10-CM | POA: Diagnosis not present

## 2021-08-31 DIAGNOSIS — J9601 Acute respiratory failure with hypoxia: Secondary | ICD-10-CM | POA: Diagnosis not present

## 2021-08-31 DIAGNOSIS — R1312 Dysphagia, oropharyngeal phase: Secondary | ICD-10-CM | POA: Diagnosis not present

## 2021-09-03 DIAGNOSIS — R2681 Unsteadiness on feet: Secondary | ICD-10-CM | POA: Diagnosis not present

## 2021-09-03 DIAGNOSIS — I5032 Chronic diastolic (congestive) heart failure: Secondary | ICD-10-CM | POA: Diagnosis not present

## 2021-09-03 DIAGNOSIS — G894 Chronic pain syndrome: Secondary | ICD-10-CM | POA: Diagnosis not present

## 2021-09-03 DIAGNOSIS — G629 Polyneuropathy, unspecified: Secondary | ICD-10-CM | POA: Diagnosis not present

## 2021-09-03 DIAGNOSIS — J9601 Acute respiratory failure with hypoxia: Secondary | ICD-10-CM | POA: Diagnosis not present

## 2021-09-03 DIAGNOSIS — R1312 Dysphagia, oropharyngeal phase: Secondary | ICD-10-CM | POA: Diagnosis not present

## 2021-09-03 DIAGNOSIS — M6281 Muscle weakness (generalized): Secondary | ICD-10-CM | POA: Diagnosis not present

## 2021-09-03 DIAGNOSIS — I10 Essential (primary) hypertension: Secondary | ICD-10-CM | POA: Diagnosis not present

## 2021-09-04 DIAGNOSIS — R2681 Unsteadiness on feet: Secondary | ICD-10-CM | POA: Diagnosis not present

## 2021-09-04 DIAGNOSIS — J9601 Acute respiratory failure with hypoxia: Secondary | ICD-10-CM | POA: Diagnosis not present

## 2021-09-04 DIAGNOSIS — M6281 Muscle weakness (generalized): Secondary | ICD-10-CM | POA: Diagnosis not present

## 2021-09-04 DIAGNOSIS — R1312 Dysphagia, oropharyngeal phase: Secondary | ICD-10-CM | POA: Diagnosis not present

## 2021-09-05 DIAGNOSIS — M6281 Muscle weakness (generalized): Secondary | ICD-10-CM | POA: Diagnosis not present

## 2021-09-05 DIAGNOSIS — J9601 Acute respiratory failure with hypoxia: Secondary | ICD-10-CM | POA: Diagnosis not present

## 2021-09-05 DIAGNOSIS — R1312 Dysphagia, oropharyngeal phase: Secondary | ICD-10-CM | POA: Diagnosis not present

## 2021-09-05 DIAGNOSIS — R2681 Unsteadiness on feet: Secondary | ICD-10-CM | POA: Diagnosis not present

## 2021-09-06 DIAGNOSIS — J9601 Acute respiratory failure with hypoxia: Secondary | ICD-10-CM | POA: Diagnosis not present

## 2021-09-06 DIAGNOSIS — R1312 Dysphagia, oropharyngeal phase: Secondary | ICD-10-CM | POA: Diagnosis not present

## 2021-09-06 DIAGNOSIS — R2681 Unsteadiness on feet: Secondary | ICD-10-CM | POA: Diagnosis not present

## 2021-09-06 DIAGNOSIS — M6281 Muscle weakness (generalized): Secondary | ICD-10-CM | POA: Diagnosis not present

## 2021-09-10 DIAGNOSIS — M6281 Muscle weakness (generalized): Secondary | ICD-10-CM | POA: Diagnosis not present

## 2021-09-10 DIAGNOSIS — R1312 Dysphagia, oropharyngeal phase: Secondary | ICD-10-CM | POA: Diagnosis not present

## 2021-09-10 DIAGNOSIS — J9601 Acute respiratory failure with hypoxia: Secondary | ICD-10-CM | POA: Diagnosis not present

## 2021-09-10 DIAGNOSIS — R2681 Unsteadiness on feet: Secondary | ICD-10-CM | POA: Diagnosis not present

## 2021-09-11 DIAGNOSIS — R1312 Dysphagia, oropharyngeal phase: Secondary | ICD-10-CM | POA: Diagnosis not present

## 2021-09-11 DIAGNOSIS — J9601 Acute respiratory failure with hypoxia: Secondary | ICD-10-CM | POA: Diagnosis not present

## 2021-09-11 DIAGNOSIS — M6281 Muscle weakness (generalized): Secondary | ICD-10-CM | POA: Diagnosis not present

## 2021-09-11 DIAGNOSIS — R2681 Unsteadiness on feet: Secondary | ICD-10-CM | POA: Diagnosis not present

## 2021-09-12 DIAGNOSIS — M6281 Muscle weakness (generalized): Secondary | ICD-10-CM | POA: Diagnosis not present

## 2021-09-12 DIAGNOSIS — I5032 Chronic diastolic (congestive) heart failure: Secondary | ICD-10-CM | POA: Diagnosis not present

## 2021-09-12 DIAGNOSIS — U071 COVID-19: Secondary | ICD-10-CM | POA: Diagnosis not present

## 2021-09-12 DIAGNOSIS — J9601 Acute respiratory failure with hypoxia: Secondary | ICD-10-CM | POA: Diagnosis not present

## 2021-09-12 DIAGNOSIS — R1312 Dysphagia, oropharyngeal phase: Secondary | ICD-10-CM | POA: Diagnosis not present

## 2021-09-12 DIAGNOSIS — G894 Chronic pain syndrome: Secondary | ICD-10-CM | POA: Diagnosis not present

## 2021-09-12 DIAGNOSIS — R2681 Unsteadiness on feet: Secondary | ICD-10-CM | POA: Diagnosis not present

## 2021-09-12 DIAGNOSIS — I10 Essential (primary) hypertension: Secondary | ICD-10-CM | POA: Diagnosis not present

## 2021-09-14 DIAGNOSIS — M6281 Muscle weakness (generalized): Secondary | ICD-10-CM | POA: Diagnosis not present

## 2021-09-14 DIAGNOSIS — R1312 Dysphagia, oropharyngeal phase: Secondary | ICD-10-CM | POA: Diagnosis not present

## 2021-09-14 DIAGNOSIS — J9601 Acute respiratory failure with hypoxia: Secondary | ICD-10-CM | POA: Diagnosis not present

## 2021-09-14 DIAGNOSIS — R2681 Unsteadiness on feet: Secondary | ICD-10-CM | POA: Diagnosis not present

## 2021-09-17 DIAGNOSIS — R2681 Unsteadiness on feet: Secondary | ICD-10-CM | POA: Diagnosis not present

## 2021-09-17 DIAGNOSIS — J9601 Acute respiratory failure with hypoxia: Secondary | ICD-10-CM | POA: Diagnosis not present

## 2021-09-17 DIAGNOSIS — M6281 Muscle weakness (generalized): Secondary | ICD-10-CM | POA: Diagnosis not present

## 2021-09-17 DIAGNOSIS — R1312 Dysphagia, oropharyngeal phase: Secondary | ICD-10-CM | POA: Diagnosis not present

## 2021-09-18 DIAGNOSIS — J9601 Acute respiratory failure with hypoxia: Secondary | ICD-10-CM | POA: Diagnosis not present

## 2021-09-18 DIAGNOSIS — M6281 Muscle weakness (generalized): Secondary | ICD-10-CM | POA: Diagnosis not present

## 2021-09-18 DIAGNOSIS — R1312 Dysphagia, oropharyngeal phase: Secondary | ICD-10-CM | POA: Diagnosis not present

## 2021-09-18 DIAGNOSIS — R2681 Unsteadiness on feet: Secondary | ICD-10-CM | POA: Diagnosis not present

## 2021-09-19 DIAGNOSIS — R2681 Unsteadiness on feet: Secondary | ICD-10-CM | POA: Diagnosis not present

## 2021-09-19 DIAGNOSIS — M6281 Muscle weakness (generalized): Secondary | ICD-10-CM | POA: Diagnosis not present

## 2021-09-19 DIAGNOSIS — J9601 Acute respiratory failure with hypoxia: Secondary | ICD-10-CM | POA: Diagnosis not present

## 2021-09-19 DIAGNOSIS — R1312 Dysphagia, oropharyngeal phase: Secondary | ICD-10-CM | POA: Diagnosis not present

## 2021-09-20 DIAGNOSIS — R2681 Unsteadiness on feet: Secondary | ICD-10-CM | POA: Diagnosis not present

## 2021-09-20 DIAGNOSIS — R1312 Dysphagia, oropharyngeal phase: Secondary | ICD-10-CM | POA: Diagnosis not present

## 2021-09-20 DIAGNOSIS — M6281 Muscle weakness (generalized): Secondary | ICD-10-CM | POA: Diagnosis not present

## 2021-09-20 DIAGNOSIS — J9601 Acute respiratory failure with hypoxia: Secondary | ICD-10-CM | POA: Diagnosis not present

## 2021-09-24 DIAGNOSIS — M6281 Muscle weakness (generalized): Secondary | ICD-10-CM | POA: Diagnosis not present

## 2021-09-24 DIAGNOSIS — R2681 Unsteadiness on feet: Secondary | ICD-10-CM | POA: Diagnosis not present

## 2021-09-24 DIAGNOSIS — J9601 Acute respiratory failure with hypoxia: Secondary | ICD-10-CM | POA: Diagnosis not present

## 2021-09-24 DIAGNOSIS — N390 Urinary tract infection, site not specified: Secondary | ICD-10-CM | POA: Diagnosis not present

## 2021-09-24 DIAGNOSIS — R1312 Dysphagia, oropharyngeal phase: Secondary | ICD-10-CM | POA: Diagnosis not present

## 2021-09-25 DIAGNOSIS — R2681 Unsteadiness on feet: Secondary | ICD-10-CM | POA: Diagnosis not present

## 2021-09-25 DIAGNOSIS — M6281 Muscle weakness (generalized): Secondary | ICD-10-CM | POA: Diagnosis not present

## 2021-09-25 DIAGNOSIS — R1312 Dysphagia, oropharyngeal phase: Secondary | ICD-10-CM | POA: Diagnosis not present

## 2021-09-25 DIAGNOSIS — J9601 Acute respiratory failure with hypoxia: Secondary | ICD-10-CM | POA: Diagnosis not present

## 2021-09-26 DIAGNOSIS — R1312 Dysphagia, oropharyngeal phase: Secondary | ICD-10-CM | POA: Diagnosis not present

## 2021-09-26 DIAGNOSIS — R2681 Unsteadiness on feet: Secondary | ICD-10-CM | POA: Diagnosis not present

## 2021-09-26 DIAGNOSIS — M6281 Muscle weakness (generalized): Secondary | ICD-10-CM | POA: Diagnosis not present

## 2021-09-26 DIAGNOSIS — J9601 Acute respiratory failure with hypoxia: Secondary | ICD-10-CM | POA: Diagnosis not present

## 2021-09-27 DIAGNOSIS — J9601 Acute respiratory failure with hypoxia: Secondary | ICD-10-CM | POA: Diagnosis not present

## 2021-09-27 DIAGNOSIS — M6281 Muscle weakness (generalized): Secondary | ICD-10-CM | POA: Diagnosis not present

## 2021-09-27 DIAGNOSIS — R1312 Dysphagia, oropharyngeal phase: Secondary | ICD-10-CM | POA: Diagnosis not present

## 2021-09-27 DIAGNOSIS — R2681 Unsteadiness on feet: Secondary | ICD-10-CM | POA: Diagnosis not present

## 2021-09-28 DIAGNOSIS — N390 Urinary tract infection, site not specified: Secondary | ICD-10-CM | POA: Diagnosis not present

## 2021-09-28 DIAGNOSIS — I2510 Atherosclerotic heart disease of native coronary artery without angina pectoris: Secondary | ICD-10-CM | POA: Diagnosis not present

## 2021-09-28 DIAGNOSIS — R2681 Unsteadiness on feet: Secondary | ICD-10-CM | POA: Diagnosis not present

## 2021-09-28 DIAGNOSIS — M6281 Muscle weakness (generalized): Secondary | ICD-10-CM | POA: Diagnosis not present

## 2021-09-28 DIAGNOSIS — R1312 Dysphagia, oropharyngeal phase: Secondary | ICD-10-CM | POA: Diagnosis not present

## 2021-09-28 DIAGNOSIS — I951 Orthostatic hypotension: Secondary | ICD-10-CM | POA: Diagnosis not present

## 2021-09-28 DIAGNOSIS — Z79899 Other long term (current) drug therapy: Secondary | ICD-10-CM | POA: Diagnosis not present

## 2021-09-28 DIAGNOSIS — J9601 Acute respiratory failure with hypoxia: Secondary | ICD-10-CM | POA: Diagnosis not present

## 2021-09-30 DIAGNOSIS — M549 Dorsalgia, unspecified: Secondary | ICD-10-CM | POA: Diagnosis not present

## 2021-09-30 DIAGNOSIS — F32A Depression, unspecified: Secondary | ICD-10-CM | POA: Diagnosis not present

## 2021-09-30 DIAGNOSIS — I10 Essential (primary) hypertension: Secondary | ICD-10-CM | POA: Diagnosis not present

## 2021-09-30 DIAGNOSIS — G894 Chronic pain syndrome: Secondary | ICD-10-CM | POA: Diagnosis not present

## 2021-09-30 DIAGNOSIS — G629 Polyneuropathy, unspecified: Secondary | ICD-10-CM | POA: Diagnosis not present

## 2021-09-30 DIAGNOSIS — I5032 Chronic diastolic (congestive) heart failure: Secondary | ICD-10-CM | POA: Diagnosis not present

## 2021-09-30 DIAGNOSIS — U071 COVID-19: Secondary | ICD-10-CM | POA: Diagnosis not present

## 2021-09-30 DIAGNOSIS — I2510 Atherosclerotic heart disease of native coronary artery without angina pectoris: Secondary | ICD-10-CM | POA: Diagnosis not present

## 2021-09-30 DIAGNOSIS — K5900 Constipation, unspecified: Secondary | ICD-10-CM | POA: Diagnosis not present

## 2021-10-01 DIAGNOSIS — R2681 Unsteadiness on feet: Secondary | ICD-10-CM | POA: Diagnosis not present

## 2021-10-01 DIAGNOSIS — M6281 Muscle weakness (generalized): Secondary | ICD-10-CM | POA: Diagnosis not present

## 2021-10-01 DIAGNOSIS — J9601 Acute respiratory failure with hypoxia: Secondary | ICD-10-CM | POA: Diagnosis not present

## 2021-10-01 DIAGNOSIS — R1312 Dysphagia, oropharyngeal phase: Secondary | ICD-10-CM | POA: Diagnosis not present

## 2021-10-02 DIAGNOSIS — R1312 Dysphagia, oropharyngeal phase: Secondary | ICD-10-CM | POA: Diagnosis not present

## 2021-10-02 DIAGNOSIS — R2681 Unsteadiness on feet: Secondary | ICD-10-CM | POA: Diagnosis not present

## 2021-10-02 DIAGNOSIS — J9601 Acute respiratory failure with hypoxia: Secondary | ICD-10-CM | POA: Diagnosis not present

## 2021-10-02 DIAGNOSIS — M6281 Muscle weakness (generalized): Secondary | ICD-10-CM | POA: Diagnosis not present

## 2021-10-04 DIAGNOSIS — R1312 Dysphagia, oropharyngeal phase: Secondary | ICD-10-CM | POA: Diagnosis not present

## 2021-10-04 DIAGNOSIS — M6281 Muscle weakness (generalized): Secondary | ICD-10-CM | POA: Diagnosis not present

## 2021-10-04 DIAGNOSIS — J9601 Acute respiratory failure with hypoxia: Secondary | ICD-10-CM | POA: Diagnosis not present

## 2021-10-04 DIAGNOSIS — R2681 Unsteadiness on feet: Secondary | ICD-10-CM | POA: Diagnosis not present

## 2021-10-08 DIAGNOSIS — G894 Chronic pain syndrome: Secondary | ICD-10-CM | POA: Diagnosis not present

## 2021-10-08 DIAGNOSIS — R2681 Unsteadiness on feet: Secondary | ICD-10-CM | POA: Diagnosis not present

## 2021-10-08 DIAGNOSIS — I10 Essential (primary) hypertension: Secondary | ICD-10-CM | POA: Diagnosis not present

## 2021-10-08 DIAGNOSIS — F32A Depression, unspecified: Secondary | ICD-10-CM | POA: Diagnosis not present

## 2021-10-08 DIAGNOSIS — R1312 Dysphagia, oropharyngeal phase: Secondary | ICD-10-CM | POA: Diagnosis not present

## 2021-10-08 DIAGNOSIS — N390 Urinary tract infection, site not specified: Secondary | ICD-10-CM | POA: Diagnosis not present

## 2021-10-08 DIAGNOSIS — J9601 Acute respiratory failure with hypoxia: Secondary | ICD-10-CM | POA: Diagnosis not present

## 2021-10-08 DIAGNOSIS — M6281 Muscle weakness (generalized): Secondary | ICD-10-CM | POA: Diagnosis not present

## 2021-10-08 DIAGNOSIS — I5032 Chronic diastolic (congestive) heart failure: Secondary | ICD-10-CM | POA: Diagnosis not present

## 2021-10-09 DIAGNOSIS — R2681 Unsteadiness on feet: Secondary | ICD-10-CM | POA: Diagnosis not present

## 2021-10-09 DIAGNOSIS — M6281 Muscle weakness (generalized): Secondary | ICD-10-CM | POA: Diagnosis not present

## 2021-10-09 DIAGNOSIS — R1312 Dysphagia, oropharyngeal phase: Secondary | ICD-10-CM | POA: Diagnosis not present

## 2021-10-09 DIAGNOSIS — J9601 Acute respiratory failure with hypoxia: Secondary | ICD-10-CM | POA: Diagnosis not present

## 2021-10-10 DIAGNOSIS — R1312 Dysphagia, oropharyngeal phase: Secondary | ICD-10-CM | POA: Diagnosis not present

## 2021-10-10 DIAGNOSIS — R2681 Unsteadiness on feet: Secondary | ICD-10-CM | POA: Diagnosis not present

## 2021-10-10 DIAGNOSIS — M6281 Muscle weakness (generalized): Secondary | ICD-10-CM | POA: Diagnosis not present

## 2021-10-10 DIAGNOSIS — J9601 Acute respiratory failure with hypoxia: Secondary | ICD-10-CM | POA: Diagnosis not present

## 2021-10-11 DIAGNOSIS — J9601 Acute respiratory failure with hypoxia: Secondary | ICD-10-CM | POA: Diagnosis not present

## 2021-10-11 DIAGNOSIS — M6281 Muscle weakness (generalized): Secondary | ICD-10-CM | POA: Diagnosis not present

## 2021-10-11 DIAGNOSIS — R2681 Unsteadiness on feet: Secondary | ICD-10-CM | POA: Diagnosis not present

## 2021-10-11 DIAGNOSIS — R1312 Dysphagia, oropharyngeal phase: Secondary | ICD-10-CM | POA: Diagnosis not present

## 2021-10-12 DIAGNOSIS — J9601 Acute respiratory failure with hypoxia: Secondary | ICD-10-CM | POA: Diagnosis not present

## 2021-10-12 DIAGNOSIS — R1312 Dysphagia, oropharyngeal phase: Secondary | ICD-10-CM | POA: Diagnosis not present

## 2021-10-12 DIAGNOSIS — M6281 Muscle weakness (generalized): Secondary | ICD-10-CM | POA: Diagnosis not present

## 2021-10-12 DIAGNOSIS — R2681 Unsteadiness on feet: Secondary | ICD-10-CM | POA: Diagnosis not present

## 2021-10-13 DIAGNOSIS — J9601 Acute respiratory failure with hypoxia: Secondary | ICD-10-CM | POA: Diagnosis not present

## 2021-10-13 DIAGNOSIS — R2681 Unsteadiness on feet: Secondary | ICD-10-CM | POA: Diagnosis not present

## 2021-10-13 DIAGNOSIS — R1312 Dysphagia, oropharyngeal phase: Secondary | ICD-10-CM | POA: Diagnosis not present

## 2021-10-13 DIAGNOSIS — M6281 Muscle weakness (generalized): Secondary | ICD-10-CM | POA: Diagnosis not present

## 2021-10-16 DIAGNOSIS — R1312 Dysphagia, oropharyngeal phase: Secondary | ICD-10-CM | POA: Diagnosis not present

## 2021-10-16 DIAGNOSIS — R2681 Unsteadiness on feet: Secondary | ICD-10-CM | POA: Diagnosis not present

## 2021-10-16 DIAGNOSIS — J9601 Acute respiratory failure with hypoxia: Secondary | ICD-10-CM | POA: Diagnosis not present

## 2021-10-16 DIAGNOSIS — M6281 Muscle weakness (generalized): Secondary | ICD-10-CM | POA: Diagnosis not present

## 2021-10-17 DIAGNOSIS — M6281 Muscle weakness (generalized): Secondary | ICD-10-CM | POA: Diagnosis not present

## 2021-10-17 DIAGNOSIS — J9601 Acute respiratory failure with hypoxia: Secondary | ICD-10-CM | POA: Diagnosis not present

## 2021-10-17 DIAGNOSIS — R1312 Dysphagia, oropharyngeal phase: Secondary | ICD-10-CM | POA: Diagnosis not present

## 2021-10-17 DIAGNOSIS — R2681 Unsteadiness on feet: Secondary | ICD-10-CM | POA: Diagnosis not present

## 2021-10-18 DIAGNOSIS — R1312 Dysphagia, oropharyngeal phase: Secondary | ICD-10-CM | POA: Diagnosis not present

## 2021-10-18 DIAGNOSIS — R2681 Unsteadiness on feet: Secondary | ICD-10-CM | POA: Diagnosis not present

## 2021-10-18 DIAGNOSIS — M6281 Muscle weakness (generalized): Secondary | ICD-10-CM | POA: Diagnosis not present

## 2021-10-18 DIAGNOSIS — J9601 Acute respiratory failure with hypoxia: Secondary | ICD-10-CM | POA: Diagnosis not present

## 2021-10-22 ENCOUNTER — Telehealth: Payer: Self-pay | Admitting: *Deleted

## 2021-10-22 DIAGNOSIS — R1312 Dysphagia, oropharyngeal phase: Secondary | ICD-10-CM | POA: Diagnosis not present

## 2021-10-22 DIAGNOSIS — J9601 Acute respiratory failure with hypoxia: Secondary | ICD-10-CM | POA: Diagnosis not present

## 2021-10-22 DIAGNOSIS — R2681 Unsteadiness on feet: Secondary | ICD-10-CM | POA: Diagnosis not present

## 2021-10-22 DIAGNOSIS — M6281 Muscle weakness (generalized): Secondary | ICD-10-CM | POA: Diagnosis not present

## 2021-10-22 NOTE — Patient Outreach (Signed)
  Care Coordination   10/22/2021 Name: Andrea Kirk MRN: [id number] DOB: [birth year]   Care Coordination Outreach Attempts:  An unsuccessful telephone outreach was attempted today to offer the patient information about available care coordination services as a benefit of their health plan.   Follow Up Plan:  Additional outreach attempts will be made to offer the patient care coordination information and services.   Encounter Outcome:  No Answer  Care Coordination Interventions Activated:  No   Care Coordination Interventions:  No, not indicated    Raina Mina, RN Care Management Coordinator [place] Office [phone]

## 2021-10-23 DIAGNOSIS — R2681 Unsteadiness on feet: Secondary | ICD-10-CM | POA: Diagnosis not present

## 2021-10-23 DIAGNOSIS — J9601 Acute respiratory failure with hypoxia: Secondary | ICD-10-CM | POA: Diagnosis not present

## 2021-10-23 DIAGNOSIS — R1312 Dysphagia, oropharyngeal phase: Secondary | ICD-10-CM | POA: Diagnosis not present

## 2021-10-23 DIAGNOSIS — M6281 Muscle weakness (generalized): Secondary | ICD-10-CM | POA: Diagnosis not present

## 2021-10-24 DIAGNOSIS — J9601 Acute respiratory failure with hypoxia: Secondary | ICD-10-CM | POA: Diagnosis not present

## 2021-10-24 DIAGNOSIS — M6281 Muscle weakness (generalized): Secondary | ICD-10-CM | POA: Diagnosis not present

## 2021-10-24 DIAGNOSIS — R1312 Dysphagia, oropharyngeal phase: Secondary | ICD-10-CM | POA: Diagnosis not present

## 2021-10-24 DIAGNOSIS — R2681 Unsteadiness on feet: Secondary | ICD-10-CM | POA: Diagnosis not present

## 2021-10-25 DIAGNOSIS — R2681 Unsteadiness on feet: Secondary | ICD-10-CM | POA: Diagnosis not present

## 2021-10-25 DIAGNOSIS — J9601 Acute respiratory failure with hypoxia: Secondary | ICD-10-CM | POA: Diagnosis not present

## 2021-10-25 DIAGNOSIS — R1312 Dysphagia, oropharyngeal phase: Secondary | ICD-10-CM | POA: Diagnosis not present

## 2021-10-25 DIAGNOSIS — M6281 Muscle weakness (generalized): Secondary | ICD-10-CM | POA: Diagnosis not present

## 2021-10-26 DIAGNOSIS — I2510 Atherosclerotic heart disease of native coronary artery without angina pectoris: Secondary | ICD-10-CM | POA: Diagnosis not present

## 2021-10-26 DIAGNOSIS — I951 Orthostatic hypotension: Secondary | ICD-10-CM | POA: Diagnosis not present

## 2021-10-26 DIAGNOSIS — N390 Urinary tract infection, site not specified: Secondary | ICD-10-CM | POA: Diagnosis not present

## 2021-10-29 DIAGNOSIS — J9601 Acute respiratory failure with hypoxia: Secondary | ICD-10-CM | POA: Diagnosis not present

## 2021-10-29 DIAGNOSIS — M6281 Muscle weakness (generalized): Secondary | ICD-10-CM | POA: Diagnosis not present

## 2021-10-29 DIAGNOSIS — R2681 Unsteadiness on feet: Secondary | ICD-10-CM | POA: Diagnosis not present

## 2021-10-29 DIAGNOSIS — R1312 Dysphagia, oropharyngeal phase: Secondary | ICD-10-CM | POA: Diagnosis not present

## 2021-10-30 ENCOUNTER — Telehealth: Payer: Self-pay | Admitting: *Deleted

## 2021-10-30 NOTE — Patient Outreach (Signed)
  Care Coordination   10/30/2021 Name: Atha Muradyan Nabi MRN: [id number] DOB: 1940/03/11   Care Coordination Outreach Attempts:  A second unsuccessful outreach was attempted today to offer the patient with information about available care coordination services as a benefit of their health plan.     Follow Up Plan:  Additional outreach attempts will be made to offer the patient care coordination information and services.   Encounter Outcome:  No Answer  Care Coordination Interventions Activated:  No   Care Coordination Interventions:  No, not indicated    Raina Mina, RN Care Management Coordinator [place] Office [phone]

## 2021-10-31 DIAGNOSIS — J9601 Acute respiratory failure with hypoxia: Secondary | ICD-10-CM | POA: Diagnosis not present

## 2021-10-31 DIAGNOSIS — R1312 Dysphagia, oropharyngeal phase: Secondary | ICD-10-CM | POA: Diagnosis not present

## 2021-10-31 DIAGNOSIS — R2681 Unsteadiness on feet: Secondary | ICD-10-CM | POA: Diagnosis not present

## 2021-10-31 DIAGNOSIS — M6281 Muscle weakness (generalized): Secondary | ICD-10-CM | POA: Diagnosis not present

## 2021-11-01 DIAGNOSIS — G894 Chronic pain syndrome: Secondary | ICD-10-CM | POA: Diagnosis not present

## 2021-11-01 DIAGNOSIS — F32A Depression, unspecified: Secondary | ICD-10-CM | POA: Diagnosis not present

## 2021-11-01 DIAGNOSIS — R1312 Dysphagia, oropharyngeal phase: Secondary | ICD-10-CM | POA: Diagnosis not present

## 2021-11-01 DIAGNOSIS — R2681 Unsteadiness on feet: Secondary | ICD-10-CM | POA: Diagnosis not present

## 2021-11-01 DIAGNOSIS — J9601 Acute respiratory failure with hypoxia: Secondary | ICD-10-CM | POA: Diagnosis not present

## 2021-11-01 DIAGNOSIS — I10 Essential (primary) hypertension: Secondary | ICD-10-CM | POA: Diagnosis not present

## 2021-11-01 DIAGNOSIS — I5032 Chronic diastolic (congestive) heart failure: Secondary | ICD-10-CM | POA: Diagnosis not present

## 2021-11-01 DIAGNOSIS — M6281 Muscle weakness (generalized): Secondary | ICD-10-CM | POA: Diagnosis not present

## 2021-11-02 DIAGNOSIS — M6281 Muscle weakness (generalized): Secondary | ICD-10-CM | POA: Diagnosis not present

## 2021-11-02 DIAGNOSIS — R1312 Dysphagia, oropharyngeal phase: Secondary | ICD-10-CM | POA: Diagnosis not present

## 2021-11-02 DIAGNOSIS — J9601 Acute respiratory failure with hypoxia: Secondary | ICD-10-CM | POA: Diagnosis not present

## 2021-11-02 DIAGNOSIS — R2681 Unsteadiness on feet: Secondary | ICD-10-CM | POA: Diagnosis not present

## 2021-11-05 DIAGNOSIS — J9601 Acute respiratory failure with hypoxia: Secondary | ICD-10-CM | POA: Diagnosis not present

## 2021-11-05 DIAGNOSIS — R2681 Unsteadiness on feet: Secondary | ICD-10-CM | POA: Diagnosis not present

## 2021-11-05 DIAGNOSIS — M6281 Muscle weakness (generalized): Secondary | ICD-10-CM | POA: Diagnosis not present

## 2021-11-05 DIAGNOSIS — R1312 Dysphagia, oropharyngeal phase: Secondary | ICD-10-CM | POA: Diagnosis not present

## 2021-11-06 DIAGNOSIS — J9601 Acute respiratory failure with hypoxia: Secondary | ICD-10-CM | POA: Diagnosis not present

## 2021-11-06 DIAGNOSIS — M6281 Muscle weakness (generalized): Secondary | ICD-10-CM | POA: Diagnosis not present

## 2021-11-06 DIAGNOSIS — R2681 Unsteadiness on feet: Secondary | ICD-10-CM | POA: Diagnosis not present

## 2021-11-06 DIAGNOSIS — R1312 Dysphagia, oropharyngeal phase: Secondary | ICD-10-CM | POA: Diagnosis not present

## 2021-11-07 DIAGNOSIS — G894 Chronic pain syndrome: Secondary | ICD-10-CM | POA: Diagnosis not present

## 2021-11-07 DIAGNOSIS — I10 Essential (primary) hypertension: Secondary | ICD-10-CM | POA: Diagnosis not present

## 2021-11-07 DIAGNOSIS — F32A Depression, unspecified: Secondary | ICD-10-CM | POA: Diagnosis not present

## 2021-11-07 DIAGNOSIS — M549 Dorsalgia, unspecified: Secondary | ICD-10-CM | POA: Diagnosis not present

## 2021-11-07 DIAGNOSIS — R1312 Dysphagia, oropharyngeal phase: Secondary | ICD-10-CM | POA: Diagnosis not present

## 2021-11-07 DIAGNOSIS — R2681 Unsteadiness on feet: Secondary | ICD-10-CM | POA: Diagnosis not present

## 2021-11-07 DIAGNOSIS — J9601 Acute respiratory failure with hypoxia: Secondary | ICD-10-CM | POA: Diagnosis not present

## 2021-11-07 DIAGNOSIS — M6281 Muscle weakness (generalized): Secondary | ICD-10-CM | POA: Diagnosis not present

## 2021-11-07 DIAGNOSIS — I5032 Chronic diastolic (congestive) heart failure: Secondary | ICD-10-CM | POA: Diagnosis not present

## 2021-11-11 DIAGNOSIS — J9601 Acute respiratory failure with hypoxia: Secondary | ICD-10-CM | POA: Diagnosis not present

## 2021-11-11 DIAGNOSIS — R2681 Unsteadiness on feet: Secondary | ICD-10-CM | POA: Diagnosis not present

## 2021-11-11 DIAGNOSIS — M6281 Muscle weakness (generalized): Secondary | ICD-10-CM | POA: Diagnosis not present

## 2021-11-11 DIAGNOSIS — R1312 Dysphagia, oropharyngeal phase: Secondary | ICD-10-CM | POA: Diagnosis not present

## 2021-11-12 DIAGNOSIS — F32A Depression, unspecified: Secondary | ICD-10-CM | POA: Diagnosis not present

## 2021-11-12 DIAGNOSIS — I10 Essential (primary) hypertension: Secondary | ICD-10-CM | POA: Diagnosis not present

## 2021-11-12 DIAGNOSIS — I5032 Chronic diastolic (congestive) heart failure: Secondary | ICD-10-CM | POA: Diagnosis not present

## 2021-11-12 DIAGNOSIS — M6281 Muscle weakness (generalized): Secondary | ICD-10-CM | POA: Diagnosis not present

## 2021-11-12 DIAGNOSIS — G894 Chronic pain syndrome: Secondary | ICD-10-CM | POA: Diagnosis not present

## 2021-11-12 DIAGNOSIS — J9601 Acute respiratory failure with hypoxia: Secondary | ICD-10-CM | POA: Diagnosis not present

## 2021-11-12 DIAGNOSIS — R2681 Unsteadiness on feet: Secondary | ICD-10-CM | POA: Diagnosis not present

## 2021-11-12 DIAGNOSIS — R1312 Dysphagia, oropharyngeal phase: Secondary | ICD-10-CM | POA: Diagnosis not present

## 2021-11-13 DIAGNOSIS — M6281 Muscle weakness (generalized): Secondary | ICD-10-CM | POA: Diagnosis not present

## 2021-11-13 DIAGNOSIS — R2681 Unsteadiness on feet: Secondary | ICD-10-CM | POA: Diagnosis not present

## 2021-11-13 DIAGNOSIS — R1312 Dysphagia, oropharyngeal phase: Secondary | ICD-10-CM | POA: Diagnosis not present

## 2021-11-13 DIAGNOSIS — J9601 Acute respiratory failure with hypoxia: Secondary | ICD-10-CM | POA: Diagnosis not present

## 2021-11-14 DIAGNOSIS — M6281 Muscle weakness (generalized): Secondary | ICD-10-CM | POA: Diagnosis not present

## 2021-11-14 DIAGNOSIS — J9601 Acute respiratory failure with hypoxia: Secondary | ICD-10-CM | POA: Diagnosis not present

## 2021-11-14 DIAGNOSIS — R1312 Dysphagia, oropharyngeal phase: Secondary | ICD-10-CM | POA: Diagnosis not present

## 2021-11-14 DIAGNOSIS — R2681 Unsteadiness on feet: Secondary | ICD-10-CM | POA: Diagnosis not present

## 2021-11-15 DIAGNOSIS — J9601 Acute respiratory failure with hypoxia: Secondary | ICD-10-CM | POA: Diagnosis not present

## 2021-11-15 DIAGNOSIS — M6281 Muscle weakness (generalized): Secondary | ICD-10-CM | POA: Diagnosis not present

## 2021-11-15 DIAGNOSIS — R2681 Unsteadiness on feet: Secondary | ICD-10-CM | POA: Diagnosis not present

## 2021-11-15 DIAGNOSIS — R1312 Dysphagia, oropharyngeal phase: Secondary | ICD-10-CM | POA: Diagnosis not present

## 2021-11-16 DIAGNOSIS — R2681 Unsteadiness on feet: Secondary | ICD-10-CM | POA: Diagnosis not present

## 2021-11-16 DIAGNOSIS — N390 Urinary tract infection, site not specified: Secondary | ICD-10-CM | POA: Diagnosis not present

## 2021-11-16 DIAGNOSIS — J9601 Acute respiratory failure with hypoxia: Secondary | ICD-10-CM | POA: Diagnosis not present

## 2021-11-16 DIAGNOSIS — R1312 Dysphagia, oropharyngeal phase: Secondary | ICD-10-CM | POA: Diagnosis not present

## 2021-11-16 DIAGNOSIS — I951 Orthostatic hypotension: Secondary | ICD-10-CM | POA: Diagnosis not present

## 2021-11-16 DIAGNOSIS — I2510 Atherosclerotic heart disease of native coronary artery without angina pectoris: Secondary | ICD-10-CM | POA: Diagnosis not present

## 2021-11-16 DIAGNOSIS — M6281 Muscle weakness (generalized): Secondary | ICD-10-CM | POA: Diagnosis not present

## 2021-11-17 DIAGNOSIS — G894 Chronic pain syndrome: Secondary | ICD-10-CM | POA: Diagnosis not present

## 2021-11-17 DIAGNOSIS — F32A Depression, unspecified: Secondary | ICD-10-CM | POA: Diagnosis not present

## 2021-11-17 DIAGNOSIS — I10 Essential (primary) hypertension: Secondary | ICD-10-CM | POA: Diagnosis not present

## 2021-11-17 DIAGNOSIS — G629 Polyneuropathy, unspecified: Secondary | ICD-10-CM | POA: Diagnosis not present

## 2021-11-17 DIAGNOSIS — I5032 Chronic diastolic (congestive) heart failure: Secondary | ICD-10-CM | POA: Diagnosis not present

## 2021-11-17 DIAGNOSIS — K5900 Constipation, unspecified: Secondary | ICD-10-CM | POA: Diagnosis not present

## 2021-11-19 DIAGNOSIS — I10 Essential (primary) hypertension: Secondary | ICD-10-CM | POA: Diagnosis not present

## 2021-11-19 DIAGNOSIS — F32A Depression, unspecified: Secondary | ICD-10-CM | POA: Diagnosis not present

## 2021-11-19 DIAGNOSIS — G894 Chronic pain syndrome: Secondary | ICD-10-CM | POA: Diagnosis not present

## 2021-11-19 DIAGNOSIS — I5032 Chronic diastolic (congestive) heart failure: Secondary | ICD-10-CM | POA: Diagnosis not present

## 2021-11-28 DIAGNOSIS — I10 Essential (primary) hypertension: Secondary | ICD-10-CM | POA: Diagnosis not present

## 2021-11-28 DIAGNOSIS — G894 Chronic pain syndrome: Secondary | ICD-10-CM | POA: Diagnosis not present

## 2021-11-28 DIAGNOSIS — F32A Depression, unspecified: Secondary | ICD-10-CM | POA: Diagnosis not present

## 2021-11-28 DIAGNOSIS — I5032 Chronic diastolic (congestive) heart failure: Secondary | ICD-10-CM | POA: Diagnosis not present

## 2021-12-01 DIAGNOSIS — F32A Depression, unspecified: Secondary | ICD-10-CM | POA: Diagnosis not present

## 2021-12-01 DIAGNOSIS — K5900 Constipation, unspecified: Secondary | ICD-10-CM | POA: Diagnosis not present

## 2021-12-01 DIAGNOSIS — G894 Chronic pain syndrome: Secondary | ICD-10-CM | POA: Diagnosis not present

## 2021-12-01 DIAGNOSIS — I10 Essential (primary) hypertension: Secondary | ICD-10-CM | POA: Diagnosis not present

## 2021-12-01 DIAGNOSIS — I5032 Chronic diastolic (congestive) heart failure: Secondary | ICD-10-CM | POA: Diagnosis not present

## 2021-12-01 DIAGNOSIS — G629 Polyneuropathy, unspecified: Secondary | ICD-10-CM | POA: Diagnosis not present

## 2021-12-03 DIAGNOSIS — I10 Essential (primary) hypertension: Secondary | ICD-10-CM | POA: Diagnosis not present

## 2021-12-03 DIAGNOSIS — K5900 Constipation, unspecified: Secondary | ICD-10-CM | POA: Diagnosis not present

## 2021-12-03 DIAGNOSIS — G894 Chronic pain syndrome: Secondary | ICD-10-CM | POA: Diagnosis not present

## 2021-12-03 DIAGNOSIS — M549 Dorsalgia, unspecified: Secondary | ICD-10-CM | POA: Diagnosis not present

## 2021-12-03 DIAGNOSIS — F32A Depression, unspecified: Secondary | ICD-10-CM | POA: Diagnosis not present

## 2021-12-03 DIAGNOSIS — G629 Polyneuropathy, unspecified: Secondary | ICD-10-CM | POA: Diagnosis not present

## 2021-12-07 DIAGNOSIS — G894 Chronic pain syndrome: Secondary | ICD-10-CM | POA: Diagnosis not present

## 2021-12-07 DIAGNOSIS — F32A Depression, unspecified: Secondary | ICD-10-CM | POA: Diagnosis not present

## 2021-12-07 DIAGNOSIS — G629 Polyneuropathy, unspecified: Secondary | ICD-10-CM | POA: Diagnosis not present

## 2021-12-07 DIAGNOSIS — I5032 Chronic diastolic (congestive) heart failure: Secondary | ICD-10-CM | POA: Diagnosis not present

## 2021-12-07 DIAGNOSIS — I10 Essential (primary) hypertension: Secondary | ICD-10-CM | POA: Diagnosis not present

## 2021-12-07 DIAGNOSIS — R040 Epistaxis: Secondary | ICD-10-CM | POA: Diagnosis not present

## 2021-12-13 DIAGNOSIS — I10 Essential (primary) hypertension: Secondary | ICD-10-CM | POA: Diagnosis not present

## 2021-12-13 DIAGNOSIS — I5032 Chronic diastolic (congestive) heart failure: Secondary | ICD-10-CM | POA: Diagnosis not present

## 2021-12-13 DIAGNOSIS — F32A Depression, unspecified: Secondary | ICD-10-CM | POA: Diagnosis not present

## 2021-12-13 DIAGNOSIS — G894 Chronic pain syndrome: Secondary | ICD-10-CM | POA: Diagnosis not present

## 2021-12-18 DIAGNOSIS — G894 Chronic pain syndrome: Secondary | ICD-10-CM | POA: Diagnosis not present

## 2021-12-18 DIAGNOSIS — I5032 Chronic diastolic (congestive) heart failure: Secondary | ICD-10-CM | POA: Diagnosis not present

## 2021-12-18 DIAGNOSIS — R633 Feeding difficulties, unspecified: Secondary | ICD-10-CM | POA: Diagnosis not present

## 2021-12-18 DIAGNOSIS — I10 Essential (primary) hypertension: Secondary | ICD-10-CM | POA: Diagnosis not present

## 2021-12-18 DIAGNOSIS — G629 Polyneuropathy, unspecified: Secondary | ICD-10-CM | POA: Diagnosis not present

## 2021-12-20 DIAGNOSIS — J9601 Acute respiratory failure with hypoxia: Secondary | ICD-10-CM | POA: Diagnosis not present

## 2021-12-20 DIAGNOSIS — M6281 Muscle weakness (generalized): Secondary | ICD-10-CM | POA: Diagnosis not present

## 2021-12-20 DIAGNOSIS — R1312 Dysphagia, oropharyngeal phase: Secondary | ICD-10-CM | POA: Diagnosis not present

## 2021-12-20 DIAGNOSIS — R2681 Unsteadiness on feet: Secondary | ICD-10-CM | POA: Diagnosis not present

## 2021-12-21 DIAGNOSIS — I2510 Atherosclerotic heart disease of native coronary artery without angina pectoris: Secondary | ICD-10-CM | POA: Diagnosis not present

## 2021-12-21 DIAGNOSIS — N390 Urinary tract infection, site not specified: Secondary | ICD-10-CM | POA: Diagnosis not present

## 2021-12-21 DIAGNOSIS — I951 Orthostatic hypotension: Secondary | ICD-10-CM | POA: Diagnosis not present

## 2021-12-24 DIAGNOSIS — R2681 Unsteadiness on feet: Secondary | ICD-10-CM | POA: Diagnosis not present

## 2021-12-24 DIAGNOSIS — J9601 Acute respiratory failure with hypoxia: Secondary | ICD-10-CM | POA: Diagnosis not present

## 2021-12-24 DIAGNOSIS — M6281 Muscle weakness (generalized): Secondary | ICD-10-CM | POA: Diagnosis not present

## 2021-12-24 DIAGNOSIS — R1312 Dysphagia, oropharyngeal phase: Secondary | ICD-10-CM | POA: Diagnosis not present

## 2021-12-25 ENCOUNTER — Ambulatory Visit (INDEPENDENT_AMBULATORY_CARE_PROVIDER_SITE_OTHER): Payer: Medicare Other | Admitting: Podiatry

## 2021-12-25 ENCOUNTER — Encounter: Payer: Self-pay | Admitting: Podiatry

## 2021-12-25 DIAGNOSIS — M79676 Pain in unspecified toe(s): Secondary | ICD-10-CM

## 2021-12-25 DIAGNOSIS — M6281 Muscle weakness (generalized): Secondary | ICD-10-CM | POA: Diagnosis not present

## 2021-12-25 DIAGNOSIS — B351 Tinea unguium: Secondary | ICD-10-CM

## 2021-12-25 DIAGNOSIS — J9601 Acute respiratory failure with hypoxia: Secondary | ICD-10-CM | POA: Diagnosis not present

## 2021-12-25 DIAGNOSIS — E1142 Type 2 diabetes mellitus with diabetic polyneuropathy: Secondary | ICD-10-CM

## 2021-12-25 DIAGNOSIS — R2681 Unsteadiness on feet: Secondary | ICD-10-CM | POA: Diagnosis not present

## 2021-12-25 DIAGNOSIS — R1312 Dysphagia, oropharyngeal phase: Secondary | ICD-10-CM | POA: Diagnosis not present

## 2021-12-25 NOTE — Progress Notes (Signed)
Subjective:  Patient ID: Roland Rack Kreger, female    DOB: [birth year],  MRN: [id number] HPI Chief Complaint  Patient presents with   Diabetes    Diabetic Exam-last A1c was 6.1, needs toenails trimmed, "my feet feel funny at the toes"   New Patient (Initial Visit)    Est pt 2018    81 y.o. female presents with the above complaint.   ROS: Denies fever chills nausea vomit muscle aches pains calf pain back pain chest pain shortness of breath.  Past Medical History:  Diagnosis Date   Acute kidney injury ([place]) 08/05/2016   Aftercare following surgery of the circulatory system, [facility] 05/11/2013   AKI (acute kidney injury) ([place]) 08/04/2016   Anxiety    takes Celexa daily   ARF (acute renal failure) ([place]) 11/07/2016   Asymptomatic stenosis of right carotid artery 03/22/2016   Back pain    occasionally   Carotid artery disease ([place]) 04/16/2013   Carotid artery occlusion    Carotid stenosis 11/16/2013   Cataract    left and immature   Coronary artery disease    Coronary atherosclerosis of native coronary artery 03/11/2013   S/p CABG in 1997    Depression    Diabetes mellitus    takes Metformin and Glipizide daily   Diabetes mellitus ([place]) 05/02/2015   Dizziness    takes Meclizine daily as needed   Essential hypertension, benign 03/11/2013   GERD (gastroesophageal reflux disease)    takes Omeprazole daily as needed   Headache(784.0)    Hyperlipidemia    takes Atorvastatin daily   Hypertension    takes Carvedilol daily   Mixed hyperlipidemia 03/11/2013   Muscle spasm    takes Robaxin daily as needed   Nausea    takes Phenergan daily as needed   Occlusion and stenosis of carotid artery without mention of cerebral infarction 07/19/2011   Pneumonia    hx of-in high school   Restless leg    takes Requip daily as needed   Seasonal allergies    takes Claritin daily as needed and Afrin as needed   Shortness of breath    with exertion   Urinary urgency    UTI (urinary tract infection) 11/07/2016    Past Surgical History:  Procedure Laterality Date   COLONOSCOPY WITH PROPOFOL N/A 03/10/2012   Procedure: COLONOSCOPY WITH PROPOFOL;  Surgeon: Garlan Fair, MD;  [facility];  Service: Endoscopy;  Laterality: N/A;   CORNEAL TRANSPLANT Right    CORONARY ARTERY BYPASS GRAFT  1997   x 6   CORONARY ARTERY BYPASS GRAFT  Jan. 1997   ENDARTERECTOMY Left 04/16/2013   Procedure: Left Carotid Artery Endatarectomy with Resection of Redundant Internal Carotid Artery;  Surgeon: Rosetta Posner, MD;  Location: [facility];  Service: Vascular;  Laterality: Left;   ENDARTERECTOMY Right 03/22/2016   Procedure: RIGHT ENDARTERECTOMY CAROTID;  Surgeon: Rosetta Posner, MD;  Location: [facility];  Service: Vascular;  Laterality: Right;   ESOPHAGOGASTRODUODENOSCOPY N/A 03/10/2012   Procedure: ESOPHAGOGASTRODUODENOSCOPY (EGD);  Surgeon: Garlan Fair, MD;  [facility];  Service: Endoscopy;  Laterality: N/A;   EYE SURGERY  March 12, 2001   CORNEA TRANSPLANT Right eye   PATCH ANGIOPLASTY Right 03/22/2016   Procedure: PATCH ANGIOPLASTY;  Surgeon: Rosetta Posner, MD;  Location: [facility];  Service: Vascular;  Laterality: Right;   [place]  march 2013   Back surgery   TONSILLECTOMY  TRIGGER FINGER RELEASE Left    thumb    Current Outpatient Medications:    LAGEVRIO 200 MG CAPS capsule, Take by mouth., Disp: , Rfl:    lisinopril (ZESTRIL) 5 MG tablet, Take 5 mg by mouth daily., Disp: , Rfl:    valsartan (DIOVAN) 80 MG tablet, Take 80 mg by mouth daily., Disp: , Rfl:    acetaminophen (TYLENOL) 325 MG tablet, Take 650 mg by mouth every 6 (six) hours as needed for moderate pain., Disp: , Rfl:    amLODipine (NORVASC) 5 MG tablet, Take 1 tablet (5 mg total) by mouth daily., Disp: , Rfl:    Ascorbic Acid (VITAMIN C) 1000 MG tablet, Take 1,000 mg by mouth daily., Disp: , Rfl:    aspirin EC 81 MG tablet, Take 81 mg by mouth every evening. , Disp: , Rfl:    busPIRone  (BUSPAR) 7.5 MG tablet, Take 7.5 mg by mouth 2 (two) times daily., Disp: , Rfl:    carvedilol (COREG) 12.5 MG tablet, Take 1 tablet (12.5 mg total) by mouth 2 (two) times daily., Disp: 60 tablet, Rfl: 2   citalopram (CELEXA) 20 MG tablet, Take 20 mg by mouth daily., Disp: , Rfl:    Cyanocobalamin (B-12) 1000 MCG TABS, Take 1,000 mcg by mouth daily., Disp: , Rfl:    docusate sodium (COLACE) 100 MG capsule, Take 100 mg by mouth 2 (two) times daily., Disp: , Rfl:    ferrous sulfate 325 (65 FE) MG tablet, Take 325 mg by mouth daily., Disp: , Rfl:    furosemide (LASIX) 20 MG tablet, Take 20 mg by mouth daily as needed for edema., Disp: , Rfl:    gabapentin (NEURONTIN) 100 MG capsule, Take 1 capsule (100 mg total) by mouth 3 (three) times daily., Disp: , Rfl:    glipiZIDE (GLUCOTROL XL) 5 MG 24 hr tablet, Take 1 tablet (5 mg total) by mouth daily with breakfast., Disp: 30 tablet, Rfl: 0   HYDROcodone-acetaminophen (NORCO/VICODIN) 5-325 MG tablet, Take 1 tablet by mouth daily as needed (breakthrough pain). Also takes 10/'325mg'$  twice daily scheduled, Disp: , Rfl:    Infant Care Products ([place]) OINT, Apply 1 application. topically as directed., Disp: , Rfl:    lidocaine (LIDODERM) 5 %, Place 1 patch onto the skin daily. Apply to back and left leg for 12 hours per day for pain. Remove every morning., Disp: , Rfl:    loperamide (IMODIUM) 2 MG capsule, Take 1 capsule (2 mg total) by mouth every 6 (six) hours as needed for diarrhea or loose stools., Disp: 30 capsule, Rfl: 0   Magnesium Oxide 250 MG TABS, Take 250 mg by mouth daily., Disp: , Rfl:    meclizine (ANTIVERT) 25 MG tablet, Take 25 mg by mouth 2 (two) times daily as needed for dizziness. For dizziness, Disp: , Rfl:    melatonin 3 MG TABS tablet, Take 3 mg by mouth at bedtime., Disp: , Rfl:    Menthol, Topical Analgesic, (BIOFREEZE) 4 % GEL, Apply 1 application topically 3 (three) times daily as needed (bilateral leg pain)., Disp: , Rfl:     methocarbamol (ROBAXIN) 500 MG tablet, Take 1 tablet (500 mg total) by mouth every 8 (eight) hours as needed for muscle spasms., Disp: , Rfl:    nitrofurantoin, macrocrystal-monohydrate, (MACROBID) 100 MG capsule, Take 100 mg by mouth 2 (two) times daily. For 10 days, Disp: , Rfl:    nitroGLYCERIN (NITROSTAT) 0.4 MG SL tablet, Place 1 tablet (0.4 mg total) under the tongue  every 5 (five) minutes as needed. Chest pain. (Patient taking differently: Place 0.4 mg under the tongue every 5 (five) minutes as needed for chest pain.), Disp: 25 tablet, Rfl: 5   nystatin (MYCOSTATIN/NYSTOP) powder, Apply 1 application topically 2 (two) times daily as needed (rash)., Disp: , Rfl:    ondansetron (ZOFRAN) 4 MG tablet, Take 1 tablet (4 mg total) by mouth daily as needed for nausea or vomiting., Disp: 30 tablet, Rfl: 1   oxybutynin (DITROPAN-XL) 5 MG 24 hr tablet, Take 5 mg by mouth daily., Disp: , Rfl:    Polyethyl Glycol-Propyl Glycol (SYSTANE) 0.4-0.3 % SOLN, Apply 1-2 drops to eye 4 (four) times daily as needed (dry eye)., Disp: , Rfl:    rosuvastatin (CRESTOR) 20 MG tablet, Take 1 tablet (20 mg total) by mouth daily., Disp: 90 tablet, Rfl: 3   saccharomyces boulardii (FLORASTOR) 250 MG capsule, Take 250 mg by mouth daily., Disp: , Rfl:    ticagrelor (BRILINTA) 90 MG TABS tablet, Take 1 tablet (90 mg total) by mouth 2 (two) times daily., Disp: 60 tablet, Rfl: 0  Allergies  Allergen Reactions   Codeine Other (See Comments)    Abnormal behavior    Latex Rash and Other (See Comments)    tears skin    Morphine And Related Other (See Comments)    Affects BP and blood sugar.   Cyclobenzaprine     MADE PT SICK- pt unsure if med was cyclobenzaprine or methocarbamol    Methocarbamol Other (See Comments)    MADE PT SICK- pt unsure if med was cyclobenzaprine or methocarbamol  11/09/20 pt currently takes methocarbamol   Review of Systems Objective:  There were no vitals filed for this visit.  General: Well  developed, nourished, in no acute distress, alert and oriented x3   Dermatological: Skin is warm, dry and supple bilateral. Nails x 10 are thick yellow dystrophic clinically mycotic painful on palpation and debridement.; remaining integument appears unremarkable at this time. There are no open sores, no preulcerative lesions, no rash or signs of infection present.  Vascular: Dorsalis Pedis artery and Posterior Tibial artery pedal pulses are 2/4 bilateral with immedate capillary fill time. Pedal hair growth present. No varicosities and no lower extremity edema present bilateral.   Neruologic: Grossly intact via light touch bilateral. Vibratory intact via tuning fork bilateral. Protective threshold with Semmes Wienstein monofilament intact to all pedal sites bilateral. Patellar and Achilles deep tendon reflexes 2+ bilateral. No Babinski or clonus noted bilateral.   Musculoskeletal: No gross boney pedal deformities bilateral. No pain, crepitus, or limitation noted with foot and ankle range of motion bilateral. Muscular strength 5/5 in all groups tested bilateral.  Gait: Assisted with use of a wheelchair walking behind it.  Nonantalgic gait.   Radiographs:  None taken  Assessment & Plan:   Assessment: Pain in limb secondary to onychomycosis diabetic peripheral neuropathy.  Plan: Debridement of toenails 1 through 5 bilateral.     Leoda Smithhart T. [place], [place]

## 2021-12-26 DIAGNOSIS — R2681 Unsteadiness on feet: Secondary | ICD-10-CM | POA: Diagnosis not present

## 2021-12-26 DIAGNOSIS — M6281 Muscle weakness (generalized): Secondary | ICD-10-CM | POA: Diagnosis not present

## 2021-12-26 DIAGNOSIS — J9601 Acute respiratory failure with hypoxia: Secondary | ICD-10-CM | POA: Diagnosis not present

## 2021-12-26 DIAGNOSIS — R1312 Dysphagia, oropharyngeal phase: Secondary | ICD-10-CM | POA: Diagnosis not present

## 2021-12-27 DIAGNOSIS — M6281 Muscle weakness (generalized): Secondary | ICD-10-CM | POA: Diagnosis not present

## 2021-12-27 DIAGNOSIS — J9601 Acute respiratory failure with hypoxia: Secondary | ICD-10-CM | POA: Diagnosis not present

## 2021-12-27 DIAGNOSIS — R1312 Dysphagia, oropharyngeal phase: Secondary | ICD-10-CM | POA: Diagnosis not present

## 2021-12-27 DIAGNOSIS — R2681 Unsteadiness on feet: Secondary | ICD-10-CM | POA: Diagnosis not present

## 2021-12-28 DIAGNOSIS — R2681 Unsteadiness on feet: Secondary | ICD-10-CM | POA: Diagnosis not present

## 2021-12-28 DIAGNOSIS — M6281 Muscle weakness (generalized): Secondary | ICD-10-CM | POA: Diagnosis not present

## 2021-12-28 DIAGNOSIS — R1312 Dysphagia, oropharyngeal phase: Secondary | ICD-10-CM | POA: Diagnosis not present

## 2021-12-28 DIAGNOSIS — J9601 Acute respiratory failure with hypoxia: Secondary | ICD-10-CM | POA: Diagnosis not present

## 2022-01-01 DIAGNOSIS — K219 Gastro-esophageal reflux disease without esophagitis: Secondary | ICD-10-CM | POA: Diagnosis not present

## 2022-01-01 DIAGNOSIS — R2681 Unsteadiness on feet: Secondary | ICD-10-CM | POA: Diagnosis not present

## 2022-01-01 DIAGNOSIS — I5032 Chronic diastolic (congestive) heart failure: Secondary | ICD-10-CM | POA: Diagnosis not present

## 2022-01-01 DIAGNOSIS — J9601 Acute respiratory failure with hypoxia: Secondary | ICD-10-CM | POA: Diagnosis not present

## 2022-01-01 DIAGNOSIS — I10 Essential (primary) hypertension: Secondary | ICD-10-CM | POA: Diagnosis not present

## 2022-01-01 DIAGNOSIS — R1312 Dysphagia, oropharyngeal phase: Secondary | ICD-10-CM | POA: Diagnosis not present

## 2022-01-01 DIAGNOSIS — G894 Chronic pain syndrome: Secondary | ICD-10-CM | POA: Diagnosis not present

## 2022-01-01 DIAGNOSIS — M6281 Muscle weakness (generalized): Secondary | ICD-10-CM | POA: Diagnosis not present

## 2022-01-02 ENCOUNTER — Other Ambulatory Visit: Payer: Self-pay | Admitting: *Deleted

## 2022-01-02 DIAGNOSIS — J9601 Acute respiratory failure with hypoxia: Secondary | ICD-10-CM | POA: Diagnosis not present

## 2022-01-02 DIAGNOSIS — R2681 Unsteadiness on feet: Secondary | ICD-10-CM | POA: Diagnosis not present

## 2022-01-02 DIAGNOSIS — I6523 Occlusion and stenosis of bilateral carotid arteries: Secondary | ICD-10-CM

## 2022-01-02 DIAGNOSIS — R1312 Dysphagia, oropharyngeal phase: Secondary | ICD-10-CM | POA: Diagnosis not present

## 2022-01-02 DIAGNOSIS — M6281 Muscle weakness (generalized): Secondary | ICD-10-CM | POA: Diagnosis not present

## 2022-01-03 DIAGNOSIS — M6281 Muscle weakness (generalized): Secondary | ICD-10-CM | POA: Diagnosis not present

## 2022-01-03 DIAGNOSIS — R1312 Dysphagia, oropharyngeal phase: Secondary | ICD-10-CM | POA: Diagnosis not present

## 2022-01-03 DIAGNOSIS — R2681 Unsteadiness on feet: Secondary | ICD-10-CM | POA: Diagnosis not present

## 2022-01-03 DIAGNOSIS — J9601 Acute respiratory failure with hypoxia: Secondary | ICD-10-CM | POA: Diagnosis not present

## 2022-01-05 DIAGNOSIS — J9601 Acute respiratory failure with hypoxia: Secondary | ICD-10-CM | POA: Diagnosis not present

## 2022-01-05 DIAGNOSIS — R1312 Dysphagia, oropharyngeal phase: Secondary | ICD-10-CM | POA: Diagnosis not present

## 2022-01-05 DIAGNOSIS — M6281 Muscle weakness (generalized): Secondary | ICD-10-CM | POA: Diagnosis not present

## 2022-01-05 DIAGNOSIS — R2681 Unsteadiness on feet: Secondary | ICD-10-CM | POA: Diagnosis not present

## 2022-01-07 DIAGNOSIS — M6281 Muscle weakness (generalized): Secondary | ICD-10-CM | POA: Diagnosis not present

## 2022-01-07 DIAGNOSIS — R2681 Unsteadiness on feet: Secondary | ICD-10-CM | POA: Diagnosis not present

## 2022-01-07 DIAGNOSIS — J9601 Acute respiratory failure with hypoxia: Secondary | ICD-10-CM | POA: Diagnosis not present

## 2022-01-07 DIAGNOSIS — R1312 Dysphagia, oropharyngeal phase: Secondary | ICD-10-CM | POA: Diagnosis not present

## 2022-01-08 DIAGNOSIS — R2681 Unsteadiness on feet: Secondary | ICD-10-CM | POA: Diagnosis not present

## 2022-01-08 DIAGNOSIS — J9601 Acute respiratory failure with hypoxia: Secondary | ICD-10-CM | POA: Diagnosis not present

## 2022-01-08 DIAGNOSIS — M6281 Muscle weakness (generalized): Secondary | ICD-10-CM | POA: Diagnosis not present

## 2022-01-08 DIAGNOSIS — R1312 Dysphagia, oropharyngeal phase: Secondary | ICD-10-CM | POA: Diagnosis not present

## 2022-01-09 DIAGNOSIS — R1312 Dysphagia, oropharyngeal phase: Secondary | ICD-10-CM | POA: Diagnosis not present

## 2022-01-09 DIAGNOSIS — G894 Chronic pain syndrome: Secondary | ICD-10-CM | POA: Diagnosis not present

## 2022-01-09 DIAGNOSIS — R040 Epistaxis: Secondary | ICD-10-CM | POA: Diagnosis not present

## 2022-01-09 DIAGNOSIS — J9601 Acute respiratory failure with hypoxia: Secondary | ICD-10-CM | POA: Diagnosis not present

## 2022-01-09 DIAGNOSIS — F32A Depression, unspecified: Secondary | ICD-10-CM | POA: Diagnosis not present

## 2022-01-09 DIAGNOSIS — R2681 Unsteadiness on feet: Secondary | ICD-10-CM | POA: Diagnosis not present

## 2022-01-09 DIAGNOSIS — M6281 Muscle weakness (generalized): Secondary | ICD-10-CM | POA: Diagnosis not present

## 2022-01-09 DIAGNOSIS — I10 Essential (primary) hypertension: Secondary | ICD-10-CM | POA: Diagnosis not present

## 2022-01-09 DIAGNOSIS — I5032 Chronic diastolic (congestive) heart failure: Secondary | ICD-10-CM | POA: Diagnosis not present

## 2022-01-10 DIAGNOSIS — M6281 Muscle weakness (generalized): Secondary | ICD-10-CM | POA: Diagnosis not present

## 2022-01-10 DIAGNOSIS — D649 Anemia, unspecified: Secondary | ICD-10-CM | POA: Diagnosis not present

## 2022-01-10 DIAGNOSIS — J9601 Acute respiratory failure with hypoxia: Secondary | ICD-10-CM | POA: Diagnosis not present

## 2022-01-10 DIAGNOSIS — I10 Essential (primary) hypertension: Secondary | ICD-10-CM | POA: Diagnosis not present

## 2022-01-10 DIAGNOSIS — R1312 Dysphagia, oropharyngeal phase: Secondary | ICD-10-CM | POA: Diagnosis not present

## 2022-01-10 DIAGNOSIS — R2681 Unsteadiness on feet: Secondary | ICD-10-CM | POA: Diagnosis not present

## 2022-01-11 DIAGNOSIS — M6281 Muscle weakness (generalized): Secondary | ICD-10-CM | POA: Diagnosis not present

## 2022-01-11 DIAGNOSIS — R2681 Unsteadiness on feet: Secondary | ICD-10-CM | POA: Diagnosis not present

## 2022-01-11 DIAGNOSIS — R1312 Dysphagia, oropharyngeal phase: Secondary | ICD-10-CM | POA: Diagnosis not present

## 2022-01-11 DIAGNOSIS — J9601 Acute respiratory failure with hypoxia: Secondary | ICD-10-CM | POA: Diagnosis not present

## 2022-01-14 DIAGNOSIS — M6281 Muscle weakness (generalized): Secondary | ICD-10-CM | POA: Diagnosis not present

## 2022-01-14 DIAGNOSIS — R1312 Dysphagia, oropharyngeal phase: Secondary | ICD-10-CM | POA: Diagnosis not present

## 2022-01-14 DIAGNOSIS — R2681 Unsteadiness on feet: Secondary | ICD-10-CM | POA: Diagnosis not present

## 2022-01-14 DIAGNOSIS — J9601 Acute respiratory failure with hypoxia: Secondary | ICD-10-CM | POA: Diagnosis not present

## 2022-01-15 DIAGNOSIS — R1312 Dysphagia, oropharyngeal phase: Secondary | ICD-10-CM | POA: Diagnosis not present

## 2022-01-15 DIAGNOSIS — J9601 Acute respiratory failure with hypoxia: Secondary | ICD-10-CM | POA: Diagnosis not present

## 2022-01-15 DIAGNOSIS — R2681 Unsteadiness on feet: Secondary | ICD-10-CM | POA: Diagnosis not present

## 2022-01-15 DIAGNOSIS — M6281 Muscle weakness (generalized): Secondary | ICD-10-CM | POA: Diagnosis not present

## 2022-01-16 ENCOUNTER — Ambulatory Visit (HOSPITAL_COMMUNITY): Payer: Medicare Other

## 2022-01-16 ENCOUNTER — Ambulatory Visit: Payer: Medicare Other

## 2022-01-16 DIAGNOSIS — N390 Urinary tract infection, site not specified: Secondary | ICD-10-CM | POA: Diagnosis not present

## 2022-01-16 DIAGNOSIS — R2681 Unsteadiness on feet: Secondary | ICD-10-CM | POA: Diagnosis not present

## 2022-01-16 DIAGNOSIS — J9601 Acute respiratory failure with hypoxia: Secondary | ICD-10-CM | POA: Diagnosis not present

## 2022-01-16 DIAGNOSIS — R1312 Dysphagia, oropharyngeal phase: Secondary | ICD-10-CM | POA: Diagnosis not present

## 2022-01-16 DIAGNOSIS — M6281 Muscle weakness (generalized): Secondary | ICD-10-CM | POA: Diagnosis not present

## 2022-01-17 DIAGNOSIS — F32A Depression, unspecified: Secondary | ICD-10-CM | POA: Diagnosis not present

## 2022-01-17 DIAGNOSIS — N390 Urinary tract infection, site not specified: Secondary | ICD-10-CM | POA: Diagnosis not present

## 2022-01-17 DIAGNOSIS — I10 Essential (primary) hypertension: Secondary | ICD-10-CM | POA: Diagnosis not present

## 2022-01-17 DIAGNOSIS — J9601 Acute respiratory failure with hypoxia: Secondary | ICD-10-CM | POA: Diagnosis not present

## 2022-01-17 DIAGNOSIS — M6281 Muscle weakness (generalized): Secondary | ICD-10-CM | POA: Diagnosis not present

## 2022-01-17 DIAGNOSIS — R2681 Unsteadiness on feet: Secondary | ICD-10-CM | POA: Diagnosis not present

## 2022-01-17 DIAGNOSIS — G894 Chronic pain syndrome: Secondary | ICD-10-CM | POA: Diagnosis not present

## 2022-01-17 DIAGNOSIS — I5032 Chronic diastolic (congestive) heart failure: Secondary | ICD-10-CM | POA: Diagnosis not present

## 2022-01-17 DIAGNOSIS — R1312 Dysphagia, oropharyngeal phase: Secondary | ICD-10-CM | POA: Diagnosis not present

## 2022-01-18 DIAGNOSIS — M6281 Muscle weakness (generalized): Secondary | ICD-10-CM | POA: Diagnosis not present

## 2022-01-18 DIAGNOSIS — I2510 Atherosclerotic heart disease of native coronary artery without angina pectoris: Secondary | ICD-10-CM | POA: Diagnosis not present

## 2022-01-18 DIAGNOSIS — R1312 Dysphagia, oropharyngeal phase: Secondary | ICD-10-CM | POA: Diagnosis not present

## 2022-01-18 DIAGNOSIS — J9601 Acute respiratory failure with hypoxia: Secondary | ICD-10-CM | POA: Diagnosis not present

## 2022-01-18 DIAGNOSIS — R2681 Unsteadiness on feet: Secondary | ICD-10-CM | POA: Diagnosis not present

## 2022-01-18 DIAGNOSIS — N390 Urinary tract infection, site not specified: Secondary | ICD-10-CM | POA: Diagnosis not present

## 2022-01-18 DIAGNOSIS — I951 Orthostatic hypotension: Secondary | ICD-10-CM | POA: Diagnosis not present

## 2022-01-19 DIAGNOSIS — I5032 Chronic diastolic (congestive) heart failure: Secondary | ICD-10-CM | POA: Diagnosis not present

## 2022-01-19 DIAGNOSIS — G629 Polyneuropathy, unspecified: Secondary | ICD-10-CM | POA: Diagnosis not present

## 2022-01-19 DIAGNOSIS — K219 Gastro-esophageal reflux disease without esophagitis: Secondary | ICD-10-CM | POA: Diagnosis not present

## 2022-01-19 DIAGNOSIS — F32A Depression, unspecified: Secondary | ICD-10-CM | POA: Diagnosis not present

## 2022-01-19 DIAGNOSIS — I10 Essential (primary) hypertension: Secondary | ICD-10-CM | POA: Diagnosis not present

## 2022-01-19 DIAGNOSIS — I639 Cerebral infarction, unspecified: Secondary | ICD-10-CM | POA: Diagnosis not present

## 2022-01-19 DIAGNOSIS — K5900 Constipation, unspecified: Secondary | ICD-10-CM | POA: Diagnosis not present

## 2022-01-19 DIAGNOSIS — I779 Disorder of arteries and arterioles, unspecified: Secondary | ICD-10-CM | POA: Diagnosis not present

## 2022-01-19 DIAGNOSIS — G894 Chronic pain syndrome: Secondary | ICD-10-CM | POA: Diagnosis not present

## 2022-01-21 DIAGNOSIS — M6281 Muscle weakness (generalized): Secondary | ICD-10-CM | POA: Diagnosis not present

## 2022-01-21 DIAGNOSIS — J9601 Acute respiratory failure with hypoxia: Secondary | ICD-10-CM | POA: Diagnosis not present

## 2022-01-21 DIAGNOSIS — R1312 Dysphagia, oropharyngeal phase: Secondary | ICD-10-CM | POA: Diagnosis not present

## 2022-01-21 DIAGNOSIS — R2681 Unsteadiness on feet: Secondary | ICD-10-CM | POA: Diagnosis not present

## 2022-01-23 DIAGNOSIS — R2681 Unsteadiness on feet: Secondary | ICD-10-CM | POA: Diagnosis not present

## 2022-01-23 DIAGNOSIS — J9601 Acute respiratory failure with hypoxia: Secondary | ICD-10-CM | POA: Diagnosis not present

## 2022-01-23 DIAGNOSIS — M6281 Muscle weakness (generalized): Secondary | ICD-10-CM | POA: Diagnosis not present

## 2022-01-23 DIAGNOSIS — R1312 Dysphagia, oropharyngeal phase: Secondary | ICD-10-CM | POA: Diagnosis not present

## 2022-01-24 DIAGNOSIS — R2681 Unsteadiness on feet: Secondary | ICD-10-CM | POA: Diagnosis not present

## 2022-01-24 DIAGNOSIS — M6281 Muscle weakness (generalized): Secondary | ICD-10-CM | POA: Diagnosis not present

## 2022-01-24 DIAGNOSIS — R1312 Dysphagia, oropharyngeal phase: Secondary | ICD-10-CM | POA: Diagnosis not present

## 2022-01-24 DIAGNOSIS — J9601 Acute respiratory failure with hypoxia: Secondary | ICD-10-CM | POA: Diagnosis not present

## 2022-01-25 DIAGNOSIS — I10 Essential (primary) hypertension: Secondary | ICD-10-CM | POA: Diagnosis not present

## 2022-01-25 DIAGNOSIS — I5032 Chronic diastolic (congestive) heart failure: Secondary | ICD-10-CM | POA: Diagnosis not present

## 2022-01-25 DIAGNOSIS — R1312 Dysphagia, oropharyngeal phase: Secondary | ICD-10-CM | POA: Diagnosis not present

## 2022-01-25 DIAGNOSIS — M6281 Muscle weakness (generalized): Secondary | ICD-10-CM | POA: Diagnosis not present

## 2022-01-25 DIAGNOSIS — N390 Urinary tract infection, site not specified: Secondary | ICD-10-CM | POA: Diagnosis not present

## 2022-01-25 DIAGNOSIS — G894 Chronic pain syndrome: Secondary | ICD-10-CM | POA: Diagnosis not present

## 2022-01-25 DIAGNOSIS — R2681 Unsteadiness on feet: Secondary | ICD-10-CM | POA: Diagnosis not present

## 2022-01-25 DIAGNOSIS — K219 Gastro-esophageal reflux disease without esophagitis: Secondary | ICD-10-CM | POA: Diagnosis not present

## 2022-01-25 DIAGNOSIS — J9601 Acute respiratory failure with hypoxia: Secondary | ICD-10-CM | POA: Diagnosis not present

## 2022-01-26 DIAGNOSIS — I10 Essential (primary) hypertension: Secondary | ICD-10-CM | POA: Diagnosis not present

## 2022-01-28 DIAGNOSIS — N182 Chronic kidney disease, stage 2 (mild): Secondary | ICD-10-CM | POA: Diagnosis not present

## 2022-01-28 DIAGNOSIS — I5032 Chronic diastolic (congestive) heart failure: Secondary | ICD-10-CM | POA: Diagnosis not present

## 2022-01-28 DIAGNOSIS — J9601 Acute respiratory failure with hypoxia: Secondary | ICD-10-CM | POA: Diagnosis not present

## 2022-01-28 DIAGNOSIS — F32A Depression, unspecified: Secondary | ICD-10-CM | POA: Diagnosis not present

## 2022-01-28 DIAGNOSIS — R1312 Dysphagia, oropharyngeal phase: Secondary | ICD-10-CM | POA: Diagnosis not present

## 2022-01-28 DIAGNOSIS — E875 Hyperkalemia: Secondary | ICD-10-CM | POA: Diagnosis not present

## 2022-01-28 DIAGNOSIS — M6281 Muscle weakness (generalized): Secondary | ICD-10-CM | POA: Diagnosis not present

## 2022-01-28 DIAGNOSIS — R2681 Unsteadiness on feet: Secondary | ICD-10-CM | POA: Diagnosis not present

## 2022-01-28 DIAGNOSIS — G894 Chronic pain syndrome: Secondary | ICD-10-CM | POA: Diagnosis not present

## 2022-01-29 DIAGNOSIS — R1312 Dysphagia, oropharyngeal phase: Secondary | ICD-10-CM | POA: Diagnosis not present

## 2022-01-29 DIAGNOSIS — R2681 Unsteadiness on feet: Secondary | ICD-10-CM | POA: Diagnosis not present

## 2022-01-29 DIAGNOSIS — J9601 Acute respiratory failure with hypoxia: Secondary | ICD-10-CM | POA: Diagnosis not present

## 2022-01-29 DIAGNOSIS — M6281 Muscle weakness (generalized): Secondary | ICD-10-CM | POA: Diagnosis not present

## 2022-01-29 DIAGNOSIS — I10 Essential (primary) hypertension: Secondary | ICD-10-CM | POA: Diagnosis not present

## 2022-01-30 DIAGNOSIS — I10 Essential (primary) hypertension: Secondary | ICD-10-CM | POA: Diagnosis not present

## 2022-01-30 DIAGNOSIS — J9601 Acute respiratory failure with hypoxia: Secondary | ICD-10-CM | POA: Diagnosis not present

## 2022-01-30 DIAGNOSIS — R2681 Unsteadiness on feet: Secondary | ICD-10-CM | POA: Diagnosis not present

## 2022-01-30 DIAGNOSIS — M6281 Muscle weakness (generalized): Secondary | ICD-10-CM | POA: Diagnosis not present

## 2022-01-30 DIAGNOSIS — R1312 Dysphagia, oropharyngeal phase: Secondary | ICD-10-CM | POA: Diagnosis not present

## 2022-01-31 ENCOUNTER — Ambulatory Visit (INDEPENDENT_AMBULATORY_CARE_PROVIDER_SITE_OTHER): Payer: Medicare Other | Admitting: Physician Assistant

## 2022-01-31 ENCOUNTER — Ambulatory Visit (HOSPITAL_COMMUNITY)
Admission: RE | Admit: 2022-01-31 | Discharge: 2022-01-31 | Disposition: A | Discharge status: Home / Self Care | Payer: Medicare Other | Source: Ambulatory Visit | Attending: Physician Assistant | Admitting: Physician Assistant

## 2022-01-31 VITALS — BP 127/65 | HR 62 | Temp 97.7°F | Wt 181.0 lb

## 2022-01-31 DIAGNOSIS — R2681 Unsteadiness on feet: Secondary | ICD-10-CM | POA: Diagnosis not present

## 2022-01-31 DIAGNOSIS — J9601 Acute respiratory failure with hypoxia: Secondary | ICD-10-CM | POA: Diagnosis not present

## 2022-01-31 DIAGNOSIS — R1312 Dysphagia, oropharyngeal phase: Secondary | ICD-10-CM | POA: Diagnosis not present

## 2022-01-31 DIAGNOSIS — I5032 Chronic diastolic (congestive) heart failure: Secondary | ICD-10-CM | POA: Diagnosis not present

## 2022-01-31 DIAGNOSIS — E875 Hyperkalemia: Secondary | ICD-10-CM | POA: Diagnosis not present

## 2022-01-31 DIAGNOSIS — I6523 Occlusion and stenosis of bilateral carotid arteries: Secondary | ICD-10-CM

## 2022-01-31 DIAGNOSIS — M6281 Muscle weakness (generalized): Secondary | ICD-10-CM | POA: Diagnosis not present

## 2022-01-31 DIAGNOSIS — F32A Depression, unspecified: Secondary | ICD-10-CM | POA: Diagnosis not present

## 2022-01-31 DIAGNOSIS — G894 Chronic pain syndrome: Secondary | ICD-10-CM | POA: Diagnosis not present

## 2022-01-31 DIAGNOSIS — I10 Essential (primary) hypertension: Secondary | ICD-10-CM | POA: Diagnosis not present

## 2022-01-31 NOTE — Progress Notes (Signed)
Office Note     CC:  follow up Requesting Provider:  Aura Dials, MD  HPI: Andrea Kirk is a 82 y.o. (04-01-1940) female who presents for surveillance of carotid artery stenosis.  Surgical history significant for left carotid endarterectomy for asymptomatic high-grade stenosis on 04/16/2013 and right carotid endarterectomy for asymptomatic stenosis on 03/22/2016 by Dr. Donnetta Hutching.  She resides at [facility].  She denies any neurological events since last office visit.  She is on aspirin and statin daily.  She had her brother who is a retired Chief Strategy Officer on the speaker phone for today's visit.   Past Medical History:  Diagnosis Date   Acute kidney injury ([place]) 08/05/2016   Aftercare following surgery of the circulatory system, NEC 05/11/2013   AKI (acute kidney injury) ([place]) 08/04/2016   Anxiety    takes Celexa daily   ARF (acute renal failure) ([place]) 11/07/2016   Asymptomatic stenosis of right carotid artery 03/22/2016   Back pain    occasionally   Carotid artery disease ([place]) 04/16/2013   Carotid artery occlusion    Carotid stenosis 11/16/2013   Cataract    left and immature   Coronary artery disease    Coronary atherosclerosis of native coronary artery 03/11/2013   S/p CABG in 1997    Depression    Diabetes mellitus    takes Metformin and Glipizide daily   Diabetes mellitus ([place]) 05/02/2015   Dizziness    takes Meclizine daily as needed   Essential hypertension, benign 03/11/2013   GERD (gastroesophageal reflux disease)    takes Omeprazole daily as needed   Headache(784.0)    Hyperlipidemia    takes Atorvastatin daily   Hypertension    takes Carvedilol daily   Mixed hyperlipidemia 03/11/2013   Muscle spasm    takes Robaxin daily as needed   Nausea    takes Phenergan daily as needed   Occlusion and stenosis of carotid artery without mention of cerebral infarction 07/19/2011   Pneumonia    hx of-in high school   Restless leg    takes Requip daily as needed    Seasonal allergies    takes Claritin daily as needed and Afrin as needed   Shortness of breath    with exertion   Urinary urgency    UTI (urinary tract infection) 11/07/2016    Past Surgical History:  Procedure Laterality Date   COLONOSCOPY WITH PROPOFOL N/A 03/10/2012   Procedure: COLONOSCOPY WITH PROPOFOL;  Surgeon: Garlan Fair, MD;  Location: [facility];  Service: Endoscopy;  Laterality: N/A;   CORNEAL TRANSPLANT Right    CORONARY ARTERY BYPASS GRAFT  1997   x 6   CORONARY ARTERY BYPASS GRAFT  Jan. 1997   ENDARTERECTOMY Left 04/16/2013   Procedure: Left Carotid Artery Endatarectomy with Resection of Redundant Internal Carotid Artery;  Surgeon: Rosetta Posner, MD;  Location: [place];  Service: Vascular;  Laterality: Left;   ENDARTERECTOMY Right 03/22/2016   Procedure: RIGHT ENDARTERECTOMY CAROTID;  Surgeon: Rosetta Posner, MD;  Location: [facility] OR;  Service: Vascular;  Laterality: Right;   ESOPHAGOGASTRODUODENOSCOPY N/A 03/10/2012   Procedure: ESOPHAGOGASTRODUODENOSCOPY (EGD);  Surgeon: Garlan Fair, MD;  Location: [facility];  Service: Endoscopy;  Laterality: N/A;   EYE SURGERY  March 12, 2001   CORNEA TRANSPLANT Right eye   PATCH ANGIOPLASTY Right 03/22/2016   Procedure: PATCH ANGIOPLASTY;  Surgeon: Rosetta Posner, MD;  Location: [place];  Service: Vascular;  Laterality: Right;   PR VEIN BYPASS GRAFT,AORTO-FEM-POP  1997   SPINE SURGERY  march 2013   Back surgery   TONSILLECTOMY     TRIGGER FINGER RELEASE Left    thumb    Social History   Socioeconomic History   Marital status: Widowed    Spouse name: Not on file   Number of children: Not on file   Years of education: Not on file   Highest education level: Not on file  Occupational History   Not on file  Tobacco Use   Smoking status: Never   Smokeless tobacco: Never  Vaping Use   Vaping Use: Never used  Substance and Sexual Activity   Alcohol use: No    Alcohol/week: 0.0 standard drinks of alcohol   Drug use: No   Sexual  activity: Never    Birth control/protection: Post-menopausal  Other Topics Concern   Not on file  Social History Narrative   Not on file   Social Determinants of Health   Financial Resource Strain: Not on file  Food Insecurity: Not on file  Transportation Needs: Not on file  Physical Activity: Not on file  Stress: Not on file  Social Connections: Not on file  Intimate Partner Violence: Not on file    Family History  Problem Relation Age of Onset   Heart attack Father    Heart disease Father 57       Before age of 62   Hyperlipidemia Father    Heart disease Brother        Heart dissease before age 16   Hyperlipidemia Brother    Cirrhosis Mother    Heart attack Paternal Grandfather    Heart disease Paternal Grandfather    Cancer Maternal Grandmother    Alcoholism Maternal Grandfather    Heart attack Paternal Grandmother     Current Outpatient Medications  Medication Sig Dispense Refill   acetaminophen (TYLENOL) 325 MG tablet Take 650 mg by mouth every 6 (six) hours as needed for moderate pain.     amLODipine (NORVASC) 5 MG tablet Take 1 tablet (5 mg total) by mouth daily.     Ascorbic Acid (VITAMIN C) 1000 MG tablet Take 1,000 mg by mouth daily.     aspirin EC 81 MG tablet Take 81 mg by mouth every evening.      busPIRone (BUSPAR) 7.5 MG tablet Take 7.5 mg by mouth 2 (two) times daily.     citalopram (CELEXA) 20 MG tablet Take 20 mg by mouth daily.     Cyanocobalamin (B-12) 1000 MCG TABS Take 1,000 mcg by mouth daily.     docusate sodium (COLACE) 100 MG capsule Take 100 mg by mouth 2 (two) times daily.     ferrous sulfate 325 (65 FE) MG tablet Take 325 mg by mouth daily.     furosemide (LASIX) 20 MG tablet Take 20 mg by mouth daily as needed for edema.     gabapentin (NEURONTIN) 100 MG capsule Take 1 capsule (100 mg total) by mouth 3 (three) times daily.     glipiZIDE (GLUCOTROL XL) 5 MG 24 hr tablet Take 1 tablet (5 mg total) by mouth daily with breakfast. 30 tablet 0    HYDROcodone-acetaminophen (NORCO/VICODIN) 5-325 MG tablet Take 1 tablet by mouth daily as needed (breakthrough pain). Also takes 10/'325mg'$  twice daily scheduled     Infant Care Products [facility]) OINT Apply 1 application. topically as directed.     LAGEVRIO 200 MG CAPS capsule Take by mouth.     lidocaine (LIDODERM) 5 % Place 1  patch onto the skin daily. Apply to back and left leg for 12 hours per day for pain. Remove every morning.     lisinopril (ZESTRIL) 5 MG tablet Take 5 mg by mouth daily.     loperamide (IMODIUM) 2 MG capsule Take 1 capsule (2 mg total) by mouth every 6 (six) hours as needed for diarrhea or loose stools. 30 capsule 0   Magnesium Oxide 250 MG TABS Take 250 mg by mouth daily.     meclizine (ANTIVERT) 25 MG tablet Take 25 mg by mouth 2 (two) times daily as needed for dizziness. For dizziness     melatonin 3 MG TABS tablet Take 3 mg by mouth at bedtime.     Menthol, Topical Analgesic, (BIOFREEZE) 4 % GEL Apply 1 application topically 3 (three) times daily as needed (bilateral leg pain).     methocarbamol (ROBAXIN) 500 MG tablet Take 1 tablet (500 mg total) by mouth every 8 (eight) hours as needed for muscle spasms.     nitrofurantoin, macrocrystal-monohydrate, (MACROBID) 100 MG capsule Take 100 mg by mouth 2 (two) times daily. For 10 days     nitroGLYCERIN (NITROSTAT) 0.4 MG SL tablet Place 1 tablet (0.4 mg total) under the tongue every 5 (five) minutes as needed. Chest pain. (Patient taking differently: Place 0.4 mg under the tongue every 5 (five) minutes as needed for chest pain.) 25 tablet 5   nystatin (MYCOSTATIN/NYSTOP) powder Apply 1 application topically 2 (two) times daily as needed (rash).     ondansetron (ZOFRAN) 4 MG tablet Take 1 tablet (4 mg total) by mouth daily as needed for nausea or vomiting. 30 tablet 1   oxybutynin (DITROPAN-XL) 5 MG 24 hr tablet Take 5 mg by mouth daily.     Polyethyl Glycol-Propyl Glycol (SYSTANE) 0.4-0.3 % SOLN Apply 1-2 drops to eye 4  (four) times daily as needed (dry eye).     rosuvastatin (CRESTOR) 20 MG tablet Take 1 tablet (20 mg total) by mouth daily. 90 tablet 3   saccharomyces boulardii (FLORASTOR) 250 MG capsule Take 250 mg by mouth daily.     ticagrelor (BRILINTA) 90 MG TABS tablet Take 1 tablet (90 mg total) by mouth 2 (two) times daily. 60 tablet 0   valsartan (DIOVAN) 80 MG tablet Take 80 mg by mouth daily.     carvedilol (COREG) 12.5 MG tablet Take 1 tablet (12.5 mg total) by mouth 2 (two) times daily. 60 tablet 2   No current facility-administered medications for this visit.    Allergies  Allergen Reactions   Codeine Other (See Comments)    Abnormal behavior    Latex Rash and Other (See Comments)    tears skin    Morphine And Related Other (See Comments)    Affects BP and blood sugar.   Cyclobenzaprine     MADE PT SICK- pt unsure if med was cyclobenzaprine or methocarbamol    Methocarbamol Other (See Comments)    MADE PT SICK- pt unsure if med was cyclobenzaprine or methocarbamol  11/09/20 pt currently takes methocarbamol     REVIEW OF SYSTEMS:   '[X]'$  denotes positive finding, '[ ]'$  denotes negative finding Cardiac  Comments:  Chest pain or chest pressure:    Shortness of breath upon exertion:    Short of breath when lying flat:    Irregular heart rhythm:        Vascular    Pain in calf, thigh, or hip brought on by ambulation:    Pain in feet  at night that wakes you up from your sleep:     Blood clot in your veins:    Leg swelling:         Pulmonary    Oxygen at home:    Productive cough:     Wheezing:         Neurologic    Sudden weakness in arms or legs:     Sudden numbness in arms or legs:     Sudden onset of difficulty speaking or slurred speech:    Temporary loss of vision in one eye:     Problems with dizziness:         Gastrointestinal    Blood in stool:     Vomited blood:         Genitourinary    Burning when urinating:     Blood in urine:        Psychiatric     Major depression:         Hematologic    Bleeding problems:    Problems with blood clotting too easily:        Skin    Rashes or ulcers:        Constitutional    Fever or chills:      PHYSICAL EXAMINATION:  Vitals:   01/31/22 1449 01/31/22 1450  BP: 123/73 127/65  Pulse: 62   Temp: 97.7 F (36.5 C)   TempSrc: Temporal   SpO2: 91%   Weight: 181 lb (82.1 kg)     General:  WDWN in NAD; vital signs documented above Gait: Not observed HENT: WNL, normocephalic Pulmonary: normal non-labored breathing , without Rales, rhonchi,  wheezing Cardiac: regular HR Abdomen: soft, NT, no masses Skin: without rashes Vascular Exam/Pulses:  Right Left  Radial 2+ (normal) 2+ (normal)   Extremities: without ischemic changes, without Gangrene , without cellulitis; without open wounds;  Musculoskeletal: no muscle wasting or atrophy  Neurologic: A&O X 3;  CN grossly intact Psychiatric:  The pt has Normal affect.   Non-Invasive Vascular Imaging:   Right ICA 1 to 39% stenosis Left ICA 40 to 59% stenosis    ASSESSMENT/PLAN:: 82 y.o. female here for surveillance of carotid artery stenosis  -Subjectively has not had any neurological symptoms since last office visit.  She underwent carotid duplex today demonstrating stable stenosis.  Right ICA measured 1 to 39% stenosis and left ICA measured 40 to 59% stenosis.  No indication for further workup or revascularization of asymptomatic left ICA stenosis at this time.  Continue aspirin and statin daily.  Recheck carotid duplex in 1 year.   Dagoberto Ligas, PA-C [facility] [phone]  Clinic MD:   Scot Dock

## 2022-02-01 DIAGNOSIS — R2681 Unsteadiness on feet: Secondary | ICD-10-CM | POA: Diagnosis not present

## 2022-02-01 DIAGNOSIS — R1312 Dysphagia, oropharyngeal phase: Secondary | ICD-10-CM | POA: Diagnosis not present

## 2022-02-01 DIAGNOSIS — M6281 Muscle weakness (generalized): Secondary | ICD-10-CM | POA: Diagnosis not present

## 2022-02-01 DIAGNOSIS — J9601 Acute respiratory failure with hypoxia: Secondary | ICD-10-CM | POA: Diagnosis not present

## 2022-02-04 DIAGNOSIS — M6281 Muscle weakness (generalized): Secondary | ICD-10-CM | POA: Diagnosis not present

## 2022-02-04 DIAGNOSIS — R2681 Unsteadiness on feet: Secondary | ICD-10-CM | POA: Diagnosis not present

## 2022-02-04 DIAGNOSIS — J9601 Acute respiratory failure with hypoxia: Secondary | ICD-10-CM | POA: Diagnosis not present

## 2022-02-04 DIAGNOSIS — R1312 Dysphagia, oropharyngeal phase: Secondary | ICD-10-CM | POA: Diagnosis not present

## 2022-02-05 DIAGNOSIS — I110 Hypertensive heart disease with heart failure: Secondary | ICD-10-CM | POA: Diagnosis not present

## 2022-02-05 DIAGNOSIS — F32A Depression, unspecified: Secondary | ICD-10-CM | POA: Diagnosis not present

## 2022-02-05 DIAGNOSIS — G894 Chronic pain syndrome: Secondary | ICD-10-CM | POA: Diagnosis not present

## 2022-02-05 DIAGNOSIS — I5032 Chronic diastolic (congestive) heart failure: Secondary | ICD-10-CM | POA: Diagnosis not present

## 2022-02-05 DIAGNOSIS — I779 Disorder of arteries and arterioles, unspecified: Secondary | ICD-10-CM | POA: Diagnosis not present

## 2022-02-07 DIAGNOSIS — Z961 Presence of intraocular lens: Secondary | ICD-10-CM | POA: Diagnosis not present

## 2022-02-07 DIAGNOSIS — E119 Type 2 diabetes mellitus without complications: Secondary | ICD-10-CM | POA: Diagnosis not present

## 2022-02-07 DIAGNOSIS — H5211 Myopia, right eye: Secondary | ICD-10-CM | POA: Diagnosis not present

## 2022-02-15 DIAGNOSIS — I5032 Chronic diastolic (congestive) heart failure: Secondary | ICD-10-CM | POA: Diagnosis not present

## 2022-02-15 DIAGNOSIS — F32A Depression, unspecified: Secondary | ICD-10-CM | POA: Diagnosis not present

## 2022-02-15 DIAGNOSIS — I110 Hypertensive heart disease with heart failure: Secondary | ICD-10-CM | POA: Diagnosis not present

## 2022-02-15 DIAGNOSIS — G894 Chronic pain syndrome: Secondary | ICD-10-CM | POA: Diagnosis not present

## 2022-02-19 DIAGNOSIS — E559 Vitamin D deficiency, unspecified: Secondary | ICD-10-CM | POA: Diagnosis not present

## 2022-02-19 DIAGNOSIS — D649 Anemia, unspecified: Secondary | ICD-10-CM | POA: Diagnosis not present

## 2022-02-19 DIAGNOSIS — E089 Diabetes mellitus due to underlying condition without complications: Secondary | ICD-10-CM | POA: Diagnosis not present

## 2022-02-19 DIAGNOSIS — E612 Magnesium deficiency: Secondary | ICD-10-CM | POA: Diagnosis not present

## 2022-02-19 DIAGNOSIS — E119 Type 2 diabetes mellitus without complications: Secondary | ICD-10-CM | POA: Diagnosis not present

## 2022-02-19 DIAGNOSIS — E785 Hyperlipidemia, unspecified: Secondary | ICD-10-CM | POA: Diagnosis not present

## 2022-02-20 DIAGNOSIS — D519 Vitamin B12 deficiency anemia, unspecified: Secondary | ICD-10-CM | POA: Diagnosis not present

## 2022-02-20 DIAGNOSIS — E119 Type 2 diabetes mellitus without complications: Secondary | ICD-10-CM | POA: Diagnosis not present

## 2022-02-20 DIAGNOSIS — E785 Hyperlipidemia, unspecified: Secondary | ICD-10-CM | POA: Diagnosis not present

## 2022-02-20 DIAGNOSIS — G894 Chronic pain syndrome: Secondary | ICD-10-CM | POA: Diagnosis not present

## 2022-02-20 DIAGNOSIS — I10 Essential (primary) hypertension: Secondary | ICD-10-CM | POA: Diagnosis not present

## 2022-02-25 DIAGNOSIS — G894 Chronic pain syndrome: Secondary | ICD-10-CM | POA: Diagnosis not present

## 2022-02-25 DIAGNOSIS — F32A Depression, unspecified: Secondary | ICD-10-CM | POA: Diagnosis not present

## 2022-02-25 DIAGNOSIS — I110 Hypertensive heart disease with heart failure: Secondary | ICD-10-CM | POA: Diagnosis not present

## 2022-02-25 DIAGNOSIS — E875 Hyperkalemia: Secondary | ICD-10-CM | POA: Diagnosis not present

## 2022-02-25 DIAGNOSIS — I5032 Chronic diastolic (congestive) heart failure: Secondary | ICD-10-CM | POA: Diagnosis not present

## 2022-03-01 DIAGNOSIS — N390 Urinary tract infection, site not specified: Secondary | ICD-10-CM | POA: Diagnosis not present

## 2022-03-01 DIAGNOSIS — I2510 Atherosclerotic heart disease of native coronary artery without angina pectoris: Secondary | ICD-10-CM | POA: Diagnosis not present

## 2022-03-01 DIAGNOSIS — I951 Orthostatic hypotension: Secondary | ICD-10-CM | POA: Diagnosis not present

## 2022-03-08 DIAGNOSIS — I5032 Chronic diastolic (congestive) heart failure: Secondary | ICD-10-CM | POA: Diagnosis not present

## 2022-03-08 DIAGNOSIS — I10 Essential (primary) hypertension: Secondary | ICD-10-CM | POA: Diagnosis not present

## 2022-03-08 DIAGNOSIS — G894 Chronic pain syndrome: Secondary | ICD-10-CM | POA: Diagnosis not present

## 2022-03-08 DIAGNOSIS — G629 Polyneuropathy, unspecified: Secondary | ICD-10-CM | POA: Diagnosis not present

## 2022-03-08 DIAGNOSIS — E875 Hyperkalemia: Secondary | ICD-10-CM | POA: Diagnosis not present

## 2022-03-09 DIAGNOSIS — I10 Essential (primary) hypertension: Secondary | ICD-10-CM | POA: Diagnosis not present

## 2022-03-12 DIAGNOSIS — I10 Essential (primary) hypertension: Secondary | ICD-10-CM | POA: Diagnosis not present

## 2022-03-12 DIAGNOSIS — F5101 Primary insomnia: Secondary | ICD-10-CM | POA: Diagnosis not present

## 2022-03-12 DIAGNOSIS — E875 Hyperkalemia: Secondary | ICD-10-CM | POA: Diagnosis not present

## 2022-03-12 DIAGNOSIS — E119 Type 2 diabetes mellitus without complications: Secondary | ICD-10-CM | POA: Diagnosis not present

## 2022-03-12 DIAGNOSIS — G894 Chronic pain syndrome: Secondary | ICD-10-CM | POA: Diagnosis not present

## 2022-03-12 DIAGNOSIS — I5032 Chronic diastolic (congestive) heart failure: Secondary | ICD-10-CM | POA: Diagnosis not present

## 2022-03-22 DIAGNOSIS — G629 Polyneuropathy, unspecified: Secondary | ICD-10-CM | POA: Diagnosis not present

## 2022-03-22 DIAGNOSIS — M549 Dorsalgia, unspecified: Secondary | ICD-10-CM | POA: Diagnosis not present

## 2022-03-22 DIAGNOSIS — F32A Depression, unspecified: Secondary | ICD-10-CM | POA: Diagnosis not present

## 2022-03-22 DIAGNOSIS — I5032 Chronic diastolic (congestive) heart failure: Secondary | ICD-10-CM | POA: Diagnosis not present

## 2022-03-22 DIAGNOSIS — I129 Hypertensive chronic kidney disease with stage 1 through stage 4 chronic kidney disease, or unspecified chronic kidney disease: Secondary | ICD-10-CM | POA: Diagnosis not present

## 2022-03-22 DIAGNOSIS — G894 Chronic pain syndrome: Secondary | ICD-10-CM | POA: Diagnosis not present

## 2022-03-26 DIAGNOSIS — F5101 Primary insomnia: Secondary | ICD-10-CM | POA: Diagnosis not present

## 2022-03-29 DIAGNOSIS — G894 Chronic pain syndrome: Secondary | ICD-10-CM | POA: Diagnosis not present

## 2022-03-29 DIAGNOSIS — I5032 Chronic diastolic (congestive) heart failure: Secondary | ICD-10-CM | POA: Diagnosis not present

## 2022-03-29 DIAGNOSIS — F32A Depression, unspecified: Secondary | ICD-10-CM | POA: Diagnosis not present

## 2022-03-29 DIAGNOSIS — K219 Gastro-esophageal reflux disease without esophagitis: Secondary | ICD-10-CM | POA: Diagnosis not present

## 2022-03-29 DIAGNOSIS — I129 Hypertensive chronic kidney disease with stage 1 through stage 4 chronic kidney disease, or unspecified chronic kidney disease: Secondary | ICD-10-CM | POA: Diagnosis not present

## 2022-03-29 DIAGNOSIS — I951 Orthostatic hypotension: Secondary | ICD-10-CM | POA: Diagnosis not present

## 2022-03-29 DIAGNOSIS — N390 Urinary tract infection, site not specified: Secondary | ICD-10-CM | POA: Diagnosis not present

## 2022-03-29 DIAGNOSIS — I2510 Atherosclerotic heart disease of native coronary artery without angina pectoris: Secondary | ICD-10-CM | POA: Diagnosis not present

## 2022-03-31 IMAGING — CT CT CERVICAL SPINE W/O CM
4 series · 16 of 33 positions shown, 19 images · non-contrast
Comparison: 04/16/2020

CLINICAL DATA: Neck trauma (Age >= 65y)

Trip and fall at home.
EXAM:
CT CERVICAL SPINE WITHOUT CONTRAST
TECHNIQUE: Multidetector CT imaging of the cervical spine was performed without
intravenous contrast. Multiplanar CT image reconstructions were also
generated.

[Series 3: c_spine 2.0 st · axial · 0.40mm/px · z∈[-310,-214]mm · 5 of 73 slices shown, 7 images]
[im 13/73  soft-tissue]
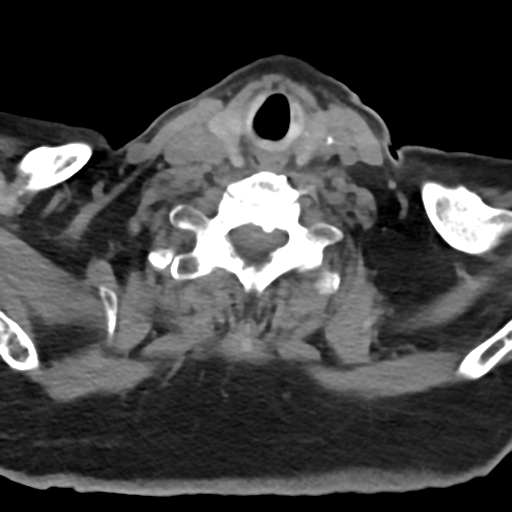
[im 13/73  bone]
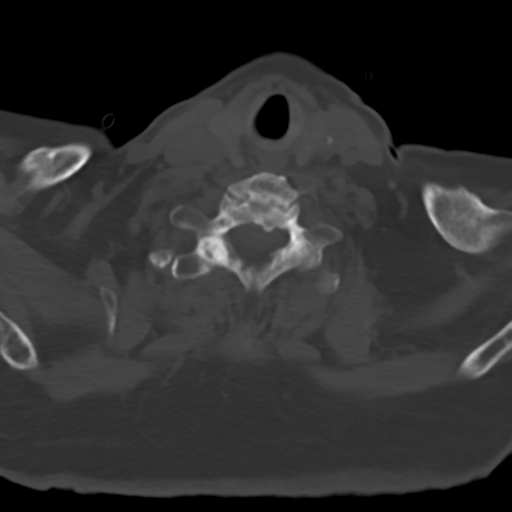
[im 25/73  bone]
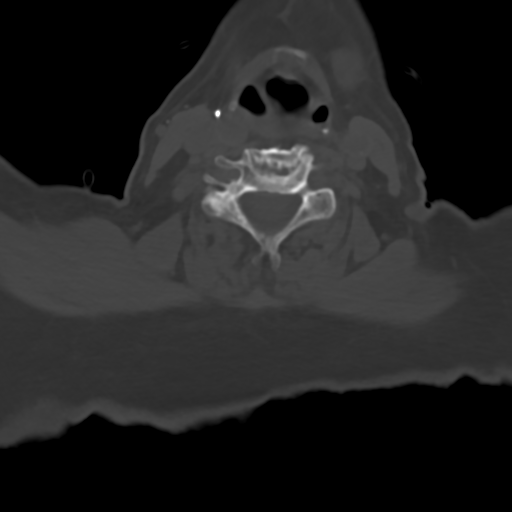
[im 37/73  bone]
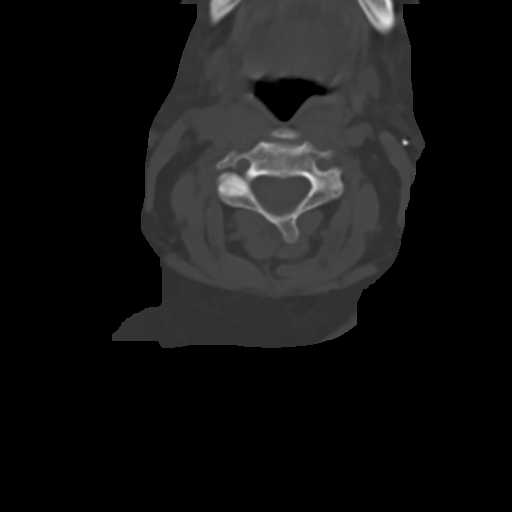
[im 49/73  bone]
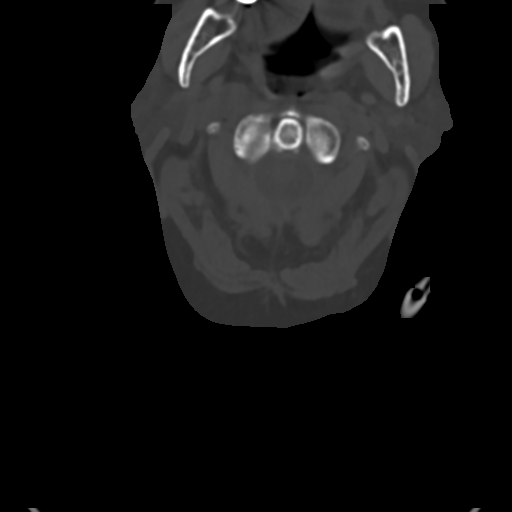
[im 61/73  soft-tissue]
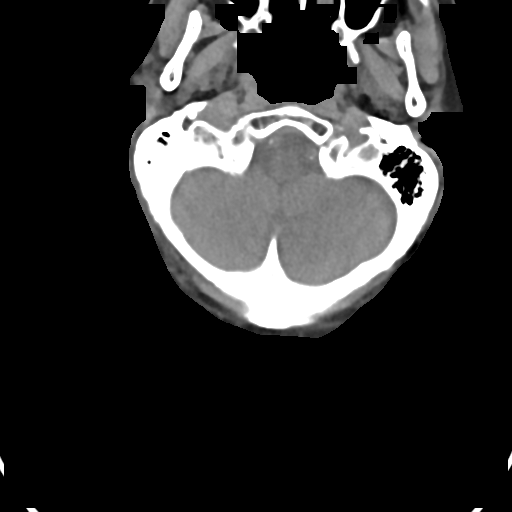
[im 61/73  bone]
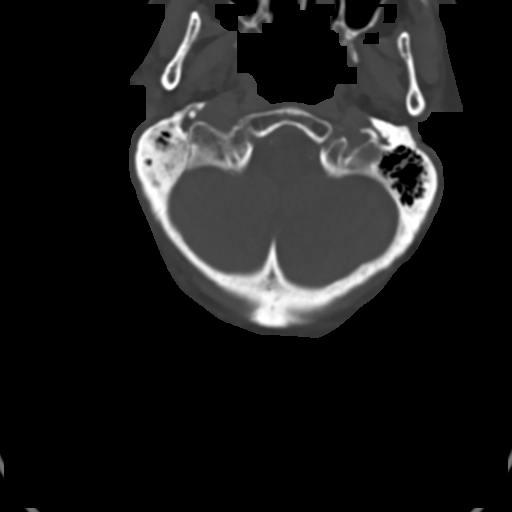

[Series 5: c_spine 2.0 orthogonals · axial · 0.21mm/px · z∈[-344,-292]mm · 3 of 81 slices shown]
[im 14/81  bone]
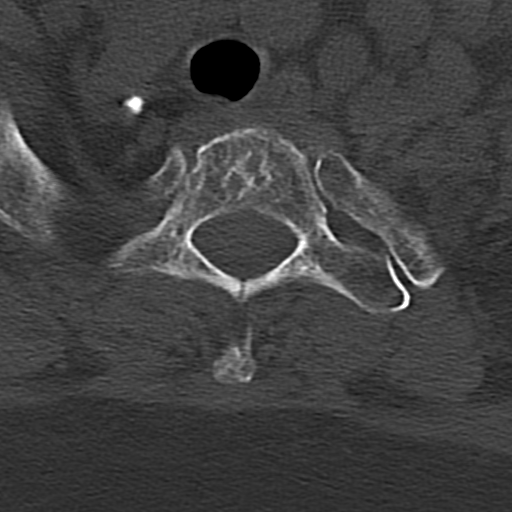
[im 27/81  bone]
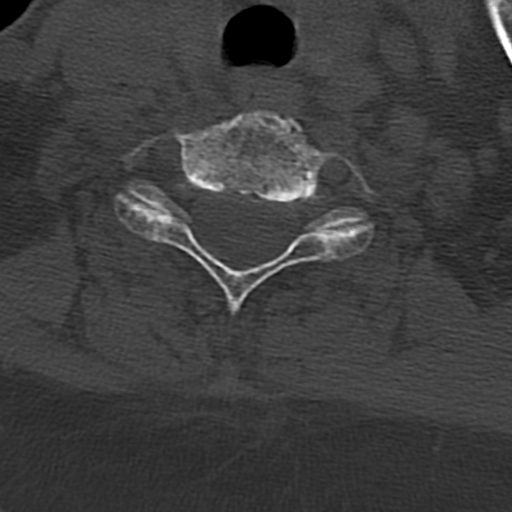
[im 41/81  bone]
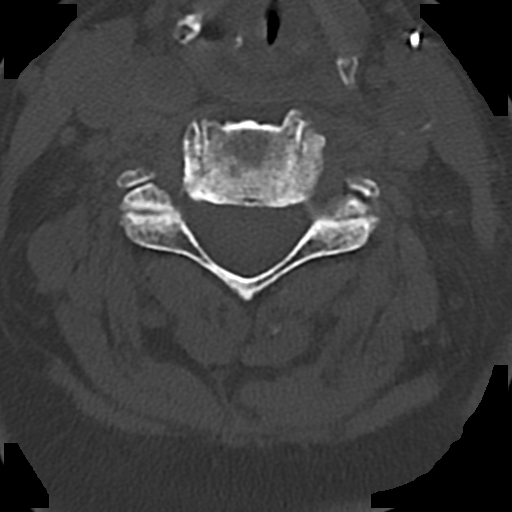

[Series 9: c_spine 2.0 sag bone · sagittal · 0.30mm/px · 5 of 66 slices shown, 6 images]
[im 22/66  bone]
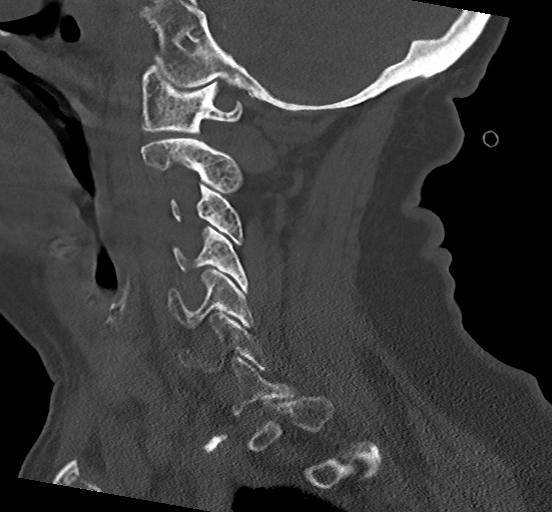
[im 28/66  bone]
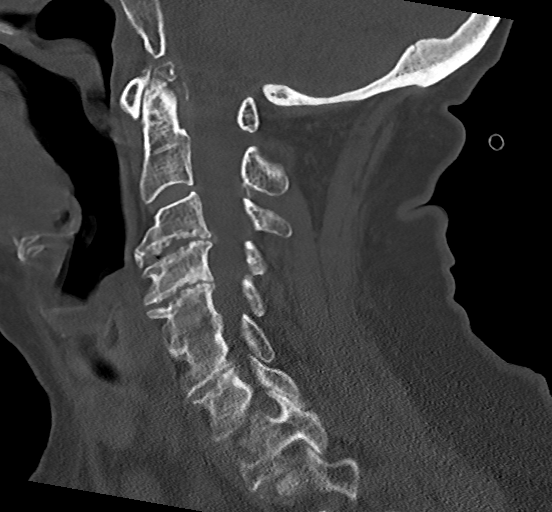
[im 33/66  soft-tissue]
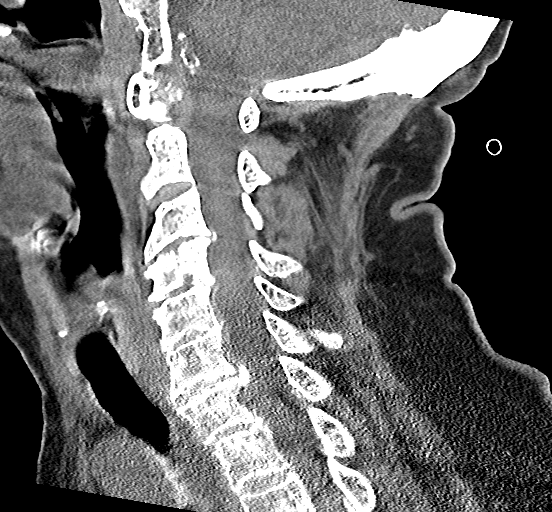
[im 33/66  bone]
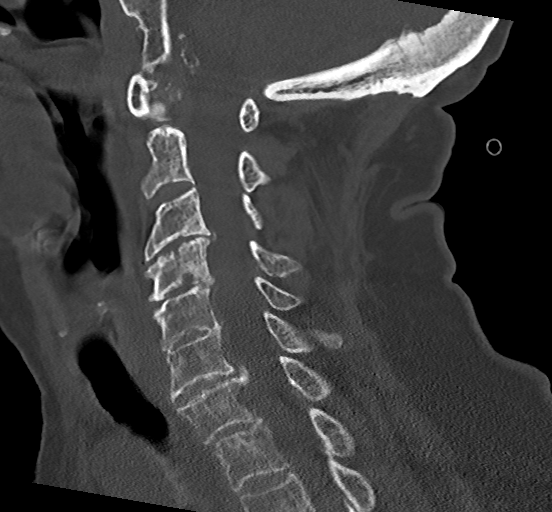
[im 38/66  bone]
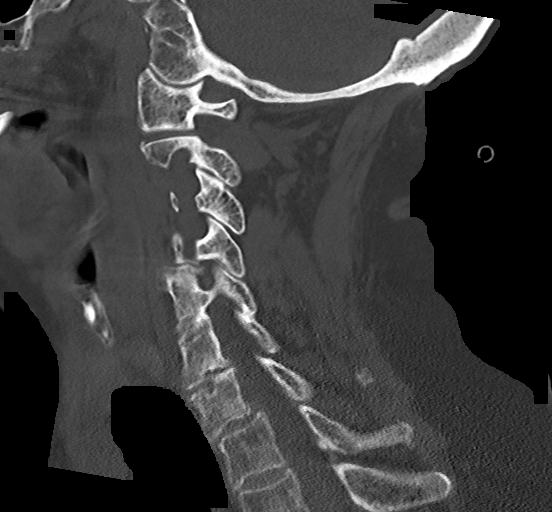
[im 44/66  bone]
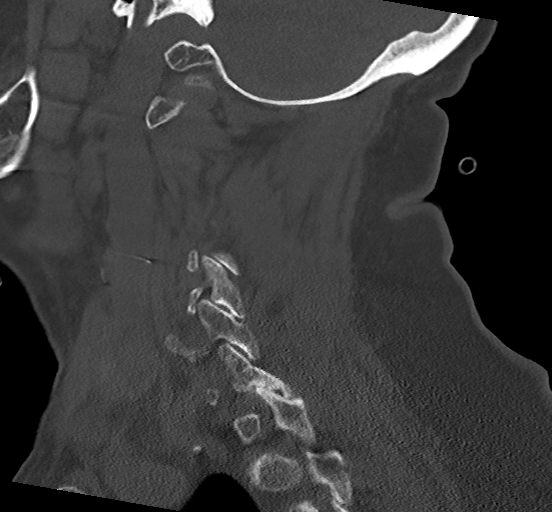

[Series 10: c_spine 2.0 cor bone · coronal · 0.31mm/px · 3 of 67 slices shown]
[im 14/67  bone]
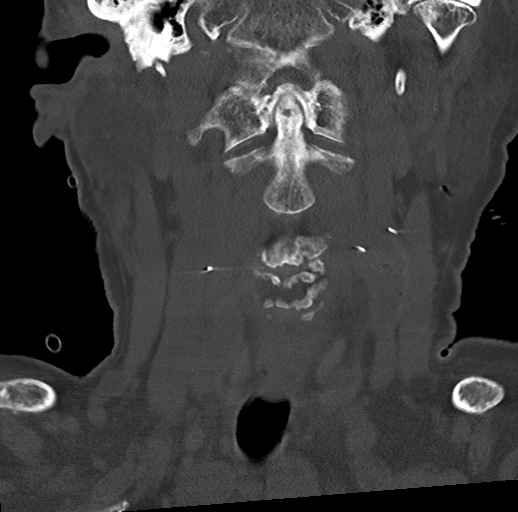
[im 27/67  bone]
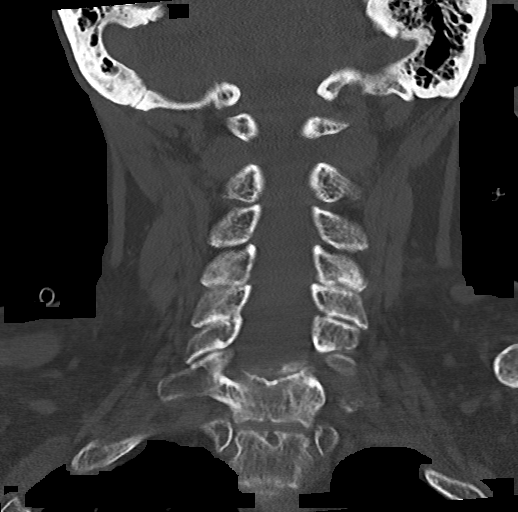
[im 40/67  bone]
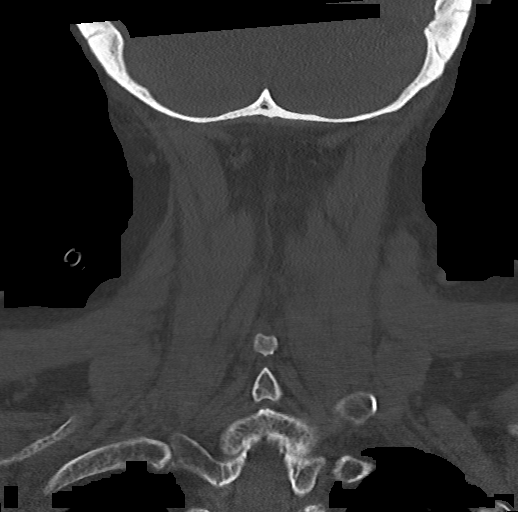

[16 of 33 positions shown; findings below may reference images not displayed]

FINDINGS: Alignment: No traumatic subluxation. Again seen straightening of
normal lordosis and minimal anterolisthesis of C7 on T1.

Skull base and vertebrae: No acute fracture. Vertebral body heights
are maintained. The dens and skull base are intact. Stable C1-C2
degenerative change with pannus formation.

Soft tissues and spinal canal: No prevertebral fluid or swelling. No
visible canal hematoma. Scattered surgical clips in the neck soft
tissues.

Disc levels: Degenerative disc disease extending from C3-C4 through
C7-T1, stable in appearance. There is mild multilevel facet
hypertrophy.

Upper chest: No acute or unexpected findings

Other: None.
IMPRESSION: 1. No acute fracture or subluxation of the cervical spine.
2. Stable degenerative change from prior exam. Again seen loss of
normal cervical lordosis.

## 2022-04-01 DIAGNOSIS — R1312 Dysphagia, oropharyngeal phase: Secondary | ICD-10-CM | POA: Diagnosis not present

## 2022-04-01 DIAGNOSIS — J9601 Acute respiratory failure with hypoxia: Secondary | ICD-10-CM | POA: Diagnosis not present

## 2022-04-01 DIAGNOSIS — M6281 Muscle weakness (generalized): Secondary | ICD-10-CM | POA: Diagnosis not present

## 2022-04-01 DIAGNOSIS — R2681 Unsteadiness on feet: Secondary | ICD-10-CM | POA: Diagnosis not present

## 2022-04-02 DIAGNOSIS — K219 Gastro-esophageal reflux disease without esophagitis: Secondary | ICD-10-CM | POA: Diagnosis not present

## 2022-04-02 DIAGNOSIS — R1312 Dysphagia, oropharyngeal phase: Secondary | ICD-10-CM | POA: Diagnosis not present

## 2022-04-02 DIAGNOSIS — I200 Unstable angina: Secondary | ICD-10-CM | POA: Diagnosis not present

## 2022-04-02 DIAGNOSIS — I2510 Atherosclerotic heart disease of native coronary artery without angina pectoris: Secondary | ICD-10-CM | POA: Diagnosis not present

## 2022-04-02 DIAGNOSIS — M6281 Muscle weakness (generalized): Secondary | ICD-10-CM | POA: Diagnosis not present

## 2022-04-02 DIAGNOSIS — J9601 Acute respiratory failure with hypoxia: Secondary | ICD-10-CM | POA: Diagnosis not present

## 2022-04-02 DIAGNOSIS — G894 Chronic pain syndrome: Secondary | ICD-10-CM | POA: Diagnosis not present

## 2022-04-02 DIAGNOSIS — R2681 Unsteadiness on feet: Secondary | ICD-10-CM | POA: Diagnosis not present

## 2022-04-02 DIAGNOSIS — I129 Hypertensive chronic kidney disease with stage 1 through stage 4 chronic kidney disease, or unspecified chronic kidney disease: Secondary | ICD-10-CM | POA: Diagnosis not present

## 2022-04-02 DIAGNOSIS — I5032 Chronic diastolic (congestive) heart failure: Secondary | ICD-10-CM | POA: Diagnosis not present

## 2022-04-04 DIAGNOSIS — K219 Gastro-esophageal reflux disease without esophagitis: Secondary | ICD-10-CM | POA: Diagnosis not present

## 2022-04-04 DIAGNOSIS — I5032 Chronic diastolic (congestive) heart failure: Secondary | ICD-10-CM | POA: Diagnosis not present

## 2022-04-04 DIAGNOSIS — J9601 Acute respiratory failure with hypoxia: Secondary | ICD-10-CM | POA: Diagnosis not present

## 2022-04-04 DIAGNOSIS — I2510 Atherosclerotic heart disease of native coronary artery without angina pectoris: Secondary | ICD-10-CM | POA: Diagnosis not present

## 2022-04-04 DIAGNOSIS — I129 Hypertensive chronic kidney disease with stage 1 through stage 4 chronic kidney disease, or unspecified chronic kidney disease: Secondary | ICD-10-CM | POA: Diagnosis not present

## 2022-04-04 DIAGNOSIS — R079 Chest pain, unspecified: Secondary | ICD-10-CM | POA: Diagnosis not present

## 2022-04-04 DIAGNOSIS — R001 Bradycardia, unspecified: Secondary | ICD-10-CM | POA: Diagnosis not present

## 2022-04-04 DIAGNOSIS — R2681 Unsteadiness on feet: Secondary | ICD-10-CM | POA: Diagnosis not present

## 2022-04-04 DIAGNOSIS — M6281 Muscle weakness (generalized): Secondary | ICD-10-CM | POA: Diagnosis not present

## 2022-04-04 DIAGNOSIS — R1312 Dysphagia, oropharyngeal phase: Secondary | ICD-10-CM | POA: Diagnosis not present

## 2022-04-04 DIAGNOSIS — K5900 Constipation, unspecified: Secondary | ICD-10-CM | POA: Diagnosis not present

## 2022-04-05 DIAGNOSIS — R2681 Unsteadiness on feet: Secondary | ICD-10-CM | POA: Diagnosis not present

## 2022-04-05 DIAGNOSIS — R1312 Dysphagia, oropharyngeal phase: Secondary | ICD-10-CM | POA: Diagnosis not present

## 2022-04-05 DIAGNOSIS — J9601 Acute respiratory failure with hypoxia: Secondary | ICD-10-CM | POA: Diagnosis not present

## 2022-04-05 DIAGNOSIS — M6281 Muscle weakness (generalized): Secondary | ICD-10-CM | POA: Diagnosis not present

## 2022-04-06 DIAGNOSIS — J9601 Acute respiratory failure with hypoxia: Secondary | ICD-10-CM | POA: Diagnosis not present

## 2022-04-06 DIAGNOSIS — M6281 Muscle weakness (generalized): Secondary | ICD-10-CM | POA: Diagnosis not present

## 2022-04-06 DIAGNOSIS — R1312 Dysphagia, oropharyngeal phase: Secondary | ICD-10-CM | POA: Diagnosis not present

## 2022-04-06 DIAGNOSIS — R2681 Unsteadiness on feet: Secondary | ICD-10-CM | POA: Diagnosis not present

## 2022-04-08 DIAGNOSIS — R2681 Unsteadiness on feet: Secondary | ICD-10-CM | POA: Diagnosis not present

## 2022-04-08 DIAGNOSIS — M6281 Muscle weakness (generalized): Secondary | ICD-10-CM | POA: Diagnosis not present

## 2022-04-08 DIAGNOSIS — R1312 Dysphagia, oropharyngeal phase: Secondary | ICD-10-CM | POA: Diagnosis not present

## 2022-04-08 DIAGNOSIS — J9601 Acute respiratory failure with hypoxia: Secondary | ICD-10-CM | POA: Diagnosis not present

## 2022-04-09 DIAGNOSIS — R1312 Dysphagia, oropharyngeal phase: Secondary | ICD-10-CM | POA: Diagnosis not present

## 2022-04-09 DIAGNOSIS — M6281 Muscle weakness (generalized): Secondary | ICD-10-CM | POA: Diagnosis not present

## 2022-04-09 DIAGNOSIS — G894 Chronic pain syndrome: Secondary | ICD-10-CM | POA: Diagnosis not present

## 2022-04-09 DIAGNOSIS — K219 Gastro-esophageal reflux disease without esophagitis: Secondary | ICD-10-CM | POA: Diagnosis not present

## 2022-04-09 DIAGNOSIS — J9601 Acute respiratory failure with hypoxia: Secondary | ICD-10-CM | POA: Diagnosis not present

## 2022-04-09 DIAGNOSIS — R2681 Unsteadiness on feet: Secondary | ICD-10-CM | POA: Diagnosis not present

## 2022-04-09 DIAGNOSIS — I200 Unstable angina: Secondary | ICD-10-CM | POA: Diagnosis not present

## 2022-04-09 DIAGNOSIS — I5032 Chronic diastolic (congestive) heart failure: Secondary | ICD-10-CM | POA: Diagnosis not present

## 2022-04-09 DIAGNOSIS — I129 Hypertensive chronic kidney disease with stage 1 through stage 4 chronic kidney disease, or unspecified chronic kidney disease: Secondary | ICD-10-CM | POA: Diagnosis not present

## 2022-04-11 DIAGNOSIS — J9601 Acute respiratory failure with hypoxia: Secondary | ICD-10-CM | POA: Diagnosis not present

## 2022-04-11 DIAGNOSIS — R2681 Unsteadiness on feet: Secondary | ICD-10-CM | POA: Diagnosis not present

## 2022-04-11 DIAGNOSIS — R1312 Dysphagia, oropharyngeal phase: Secondary | ICD-10-CM | POA: Diagnosis not present

## 2022-04-11 DIAGNOSIS — M6281 Muscle weakness (generalized): Secondary | ICD-10-CM | POA: Diagnosis not present

## 2022-04-12 DIAGNOSIS — R2681 Unsteadiness on feet: Secondary | ICD-10-CM | POA: Diagnosis not present

## 2022-04-12 DIAGNOSIS — R1312 Dysphagia, oropharyngeal phase: Secondary | ICD-10-CM | POA: Diagnosis not present

## 2022-04-12 DIAGNOSIS — I10 Essential (primary) hypertension: Secondary | ICD-10-CM | POA: Diagnosis not present

## 2022-04-12 DIAGNOSIS — J9601 Acute respiratory failure with hypoxia: Secondary | ICD-10-CM | POA: Diagnosis not present

## 2022-04-12 DIAGNOSIS — M6281 Muscle weakness (generalized): Secondary | ICD-10-CM | POA: Diagnosis not present

## 2022-04-13 DIAGNOSIS — R2681 Unsteadiness on feet: Secondary | ICD-10-CM | POA: Diagnosis not present

## 2022-04-13 DIAGNOSIS — M6281 Muscle weakness (generalized): Secondary | ICD-10-CM | POA: Diagnosis not present

## 2022-04-13 DIAGNOSIS — R1312 Dysphagia, oropharyngeal phase: Secondary | ICD-10-CM | POA: Diagnosis not present

## 2022-04-13 DIAGNOSIS — J9601 Acute respiratory failure with hypoxia: Secondary | ICD-10-CM | POA: Diagnosis not present

## 2022-04-15 DIAGNOSIS — R2681 Unsteadiness on feet: Secondary | ICD-10-CM | POA: Diagnosis not present

## 2022-04-15 DIAGNOSIS — J9601 Acute respiratory failure with hypoxia: Secondary | ICD-10-CM | POA: Diagnosis not present

## 2022-04-15 DIAGNOSIS — R1312 Dysphagia, oropharyngeal phase: Secondary | ICD-10-CM | POA: Diagnosis not present

## 2022-04-15 DIAGNOSIS — M6281 Muscle weakness (generalized): Secondary | ICD-10-CM | POA: Diagnosis not present

## 2022-04-15 DIAGNOSIS — N390 Urinary tract infection, site not specified: Secondary | ICD-10-CM | POA: Diagnosis not present

## 2022-04-16 DIAGNOSIS — R1312 Dysphagia, oropharyngeal phase: Secondary | ICD-10-CM | POA: Diagnosis not present

## 2022-04-16 DIAGNOSIS — M6281 Muscle weakness (generalized): Secondary | ICD-10-CM | POA: Diagnosis not present

## 2022-04-16 DIAGNOSIS — R2681 Unsteadiness on feet: Secondary | ICD-10-CM | POA: Diagnosis not present

## 2022-04-16 DIAGNOSIS — J9601 Acute respiratory failure with hypoxia: Secondary | ICD-10-CM | POA: Diagnosis not present

## 2022-04-17 ENCOUNTER — Ambulatory Visit: Payer: Medicare Other | Admitting: Podiatry

## 2022-04-17 DIAGNOSIS — I129 Hypertensive chronic kidney disease with stage 1 through stage 4 chronic kidney disease, or unspecified chronic kidney disease: Secondary | ICD-10-CM | POA: Diagnosis not present

## 2022-04-17 DIAGNOSIS — E119 Type 2 diabetes mellitus without complications: Secondary | ICD-10-CM | POA: Diagnosis not present

## 2022-04-17 DIAGNOSIS — G894 Chronic pain syndrome: Secondary | ICD-10-CM | POA: Diagnosis not present

## 2022-04-17 DIAGNOSIS — N390 Urinary tract infection, site not specified: Secondary | ICD-10-CM | POA: Diagnosis not present

## 2022-04-17 DIAGNOSIS — I5032 Chronic diastolic (congestive) heart failure: Secondary | ICD-10-CM | POA: Diagnosis not present

## 2022-04-18 DIAGNOSIS — R2681 Unsteadiness on feet: Secondary | ICD-10-CM | POA: Diagnosis not present

## 2022-04-18 DIAGNOSIS — J9601 Acute respiratory failure with hypoxia: Secondary | ICD-10-CM | POA: Diagnosis not present

## 2022-04-18 DIAGNOSIS — M6281 Muscle weakness (generalized): Secondary | ICD-10-CM | POA: Diagnosis not present

## 2022-04-18 DIAGNOSIS — R1312 Dysphagia, oropharyngeal phase: Secondary | ICD-10-CM | POA: Diagnosis not present

## 2022-04-20 DIAGNOSIS — J9601 Acute respiratory failure with hypoxia: Secondary | ICD-10-CM | POA: Diagnosis not present

## 2022-04-20 DIAGNOSIS — M6281 Muscle weakness (generalized): Secondary | ICD-10-CM | POA: Diagnosis not present

## 2022-04-20 DIAGNOSIS — R2681 Unsteadiness on feet: Secondary | ICD-10-CM | POA: Diagnosis not present

## 2022-04-20 DIAGNOSIS — R1312 Dysphagia, oropharyngeal phase: Secondary | ICD-10-CM | POA: Diagnosis not present

## 2022-04-22 ENCOUNTER — Encounter: Payer: Self-pay | Admitting: Podiatry

## 2022-04-22 ENCOUNTER — Ambulatory Visit (INDEPENDENT_AMBULATORY_CARE_PROVIDER_SITE_OTHER): Payer: Medicare Other | Admitting: Podiatry

## 2022-04-22 DIAGNOSIS — G894 Chronic pain syndrome: Secondary | ICD-10-CM | POA: Diagnosis not present

## 2022-04-22 DIAGNOSIS — E119 Type 2 diabetes mellitus without complications: Secondary | ICD-10-CM

## 2022-04-22 DIAGNOSIS — M2011 Hallux valgus (acquired), right foot: Secondary | ICD-10-CM | POA: Diagnosis not present

## 2022-04-22 DIAGNOSIS — R2681 Unsteadiness on feet: Secondary | ICD-10-CM | POA: Diagnosis not present

## 2022-04-22 DIAGNOSIS — E1142 Type 2 diabetes mellitus with diabetic polyneuropathy: Secondary | ICD-10-CM | POA: Diagnosis not present

## 2022-04-22 DIAGNOSIS — R1312 Dysphagia, oropharyngeal phase: Secondary | ICD-10-CM | POA: Diagnosis not present

## 2022-04-22 DIAGNOSIS — M2012 Hallux valgus (acquired), left foot: Secondary | ICD-10-CM | POA: Diagnosis not present

## 2022-04-22 DIAGNOSIS — M79676 Pain in unspecified toe(s): Secondary | ICD-10-CM | POA: Diagnosis not present

## 2022-04-22 DIAGNOSIS — M6281 Muscle weakness (generalized): Secondary | ICD-10-CM | POA: Diagnosis not present

## 2022-04-22 DIAGNOSIS — I129 Hypertensive chronic kidney disease with stage 1 through stage 4 chronic kidney disease, or unspecified chronic kidney disease: Secondary | ICD-10-CM | POA: Diagnosis not present

## 2022-04-22 DIAGNOSIS — J9601 Acute respiratory failure with hypoxia: Secondary | ICD-10-CM | POA: Diagnosis not present

## 2022-04-22 DIAGNOSIS — I5032 Chronic diastolic (congestive) heart failure: Secondary | ICD-10-CM | POA: Diagnosis not present

## 2022-04-22 DIAGNOSIS — B351 Tinea unguium: Secondary | ICD-10-CM

## 2022-04-22 NOTE — Progress Notes (Signed)
ANNUAL DIABETIC FOOT EXAM  Subjective: Andrea Kirk presents today for annual diabetic foot examination.  Chief Complaint  Patient presents with   Nail Problem    DFC BS-do not check  A1C-6.7 PCP-[facility] PCP [facility]- Do not know    Patient confirms h/o diabetes.  Patient denies any h/o foot wounds.  Patient has been diagnosed with neuropathy.  She is a resident at [facility].  Risk factors: diabetes, h/o CVA, HTN, CAD, CHF, hyperlipidemia.  Henrine Screws, MD is patient's PCP.  Past Medical History:  Diagnosis Date   Acute kidney injury 08/05/2016   Aftercare following surgery of the circulatory system, NEC 05/11/2013   AKI (acute kidney injury) 08/04/2016   Anxiety    takes Celexa daily   ARF (acute renal failure) 11/07/2016   Asymptomatic stenosis of right carotid artery 03/22/2016   Back pain    occasionally   Carotid artery disease 04/16/2013   Carotid artery occlusion    Carotid stenosis 11/16/2013   Cataract    left and immature   Coronary artery disease    Coronary atherosclerosis of native coronary artery 03/11/2013   S/p CABG in 1997    Depression    Diabetes mellitus    takes Metformin and Glipizide daily   Diabetes mellitus 05/02/2015   Dizziness    takes Meclizine daily as needed   Essential hypertension, benign 03/11/2013   GERD (gastroesophageal reflux disease)    takes Omeprazole daily as needed   Headache(784.0)    Hyperlipidemia    takes Atorvastatin daily   Hypertension    takes Carvedilol daily   Mixed hyperlipidemia 03/11/2013   Muscle spasm    takes Robaxin daily as needed   Nausea    takes Phenergan daily as needed   Occlusion and stenosis of carotid artery without mention of cerebral infarction 07/19/2011   Pneumonia    hx of-in high school   Restless leg    takes Requip daily as needed   Seasonal allergies    takes Claritin daily as needed and Afrin as needed   Shortness of breath    with exertion   Urinary urgency     UTI (urinary tract infection) 11/07/2016   Patient Active Problem List   Diagnosis Date Noted   Nausea 02/17/2021   C. difficile colitis 02/15/2021   Elevated troponin 02/14/2021   Gram-negative bacteremia 02/14/2021   Severe sepsis 02/14/2021   Thrombocytopenia 02/13/2021   Acute encephalopathy 02/13/2021   Anxiety and depression 02/13/2021   Closed fracture of acromial process of right scapula 11/21/2020   Syncope, vasovagal 06/23/2020   Pulmonary hypertension 04/20/2020   Controlled type 2 diabetes mellitus with hyperglycemia 04/20/2020   Essential hypertension 04/20/2020   HLD (hyperlipidemia) 04/20/2020   Acute renal failure superimposed on stage 2 chronic kidney disease 04/20/2020   Iron deficiency anemia 04/20/2020   Acute respiratory failure with hypoxia 04/19/2020   Obese 04/19/2020   Chronic back pain 04/19/2020   Acute stroke due to ischemia    hx of CVA (cerebral vascular accident) (HCC) 04/16/2020   Macular retinoschisis, 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   Vitreomacular traction syndrome, 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   Depression 12/22/2017   Chronic pain 12/22/2017   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 1997   ENDARTERECTOMY Left 04/16/2013   Procedure: Left Carotid Artery Endatarectomy with Resection of Redundant Internal Carotid Artery;  Surgeon: Larina Earthly, MD;  Location: [facility] OR;  Service: Vascular;  Laterality: Left;    ENDARTERECTOMY Right 03/22/2016   Procedure: RIGHT ENDARTERECTOMY CAROTID;  Surgeon: Larina Earthly, MD;  Location: [facility] OR;  Service: Vascular;  Laterality: Right;   ESOPHAGOGASTRODUODENOSCOPY N/A 03/10/2012   Procedure: ESOPHAGOGASTRODUODENOSCOPY (EGD);  Surgeon: Charolett Bumpers, MD;  Location: [facility];  Service: Endoscopy;  Laterality: N/A;   EYE SURGERY  March 12, 2001   CORNEA TRANSPLANT Right eye   PATCH ANGIOPLASTY Right 03/22/2016   Procedure: PATCH ANGIOPLASTY;  Surgeon: Larina Earthly, MD;  Location: [facility] OR;  Service: Vascular;  Laterality: Right;   PR VEIN BYPASS GRAFT,AORTO-FEM-POP  1997   SPINE SURGERY  march 2013   Back surgery   TONSILLECTOMY     TRIGGER FINGER RELEASE Left    thumb   Current Outpatient Medications on File Prior to Visit  Medication Sig Dispense Refill   acetaminophen (TYLENOL) 325 MG tablet Take 650 mg by mouth every 6 (six) hours as needed for moderate pain.     amLODipine (NORVASC) 5 MG tablet Take 1 tablet (5 mg total) by mouth daily.     Ascorbic Acid (VITAMIN C) 1000 MG tablet Take 1,000 mg by mouth daily.     aspirin EC 81 MG tablet Take 81 mg by mouth every evening.      busPIRone (BUSPAR) 7.5 MG tablet Take 7.5 mg by mouth 2 (two) times daily.     carvedilol (COREG) 12.5 MG tablet Take 1 tablet (12.5 mg total) by mouth 2 (two) times daily. 60 tablet 2   citalopram (CELEXA) 20 MG tablet Take 20 mg by mouth daily.     Cyanocobalamin (B-12) 1000 MCG TABS Take 1,000 mcg by mouth daily.     docusate sodium (COLACE) 100 MG capsule Take 100 mg by mouth 2 (two) times daily.     ferrous sulfate 325 (65 FE) MG tablet Take 325 mg by mouth daily.     furosemide (LASIX) 20 MG tablet Take 20 mg by mouth daily as needed for edema.     gabapentin (NEURONTIN) 100 MG capsule Take 1 capsule (100 mg total) by mouth 3 (three) times daily.     glipiZIDE (GLUCOTROL XL) 5 MG 24 hr tablet Take 1 tablet (5 mg total) by mouth daily with breakfast. 30 tablet 0    HYDROcodone-acetaminophen (NORCO/VICODIN) 5-325 MG tablet Take 1 tablet by mouth daily as needed (breakthrough pain). Also takes 10/325mg  twice daily scheduled     Infant Care Products [facility]) OINT Apply 1 application. topically as directed.     LAGEVRIO 200 MG CAPS capsule Take by mouth.     lidocaine (LIDODERM) 5 % Place 1 patch onto the skin daily. Apply to back and left leg for 12 hours per day for pain. Remove every morning.     lisinopril (ZESTRIL) 5 MG tablet Take 5 mg by mouth daily.     loperamide (IMODIUM) 2 MG capsule Take 1 capsule (2 mg total) by mouth every 6 (six) hours as needed for diarrhea or loose  stools. 30 capsule 0   Magnesium Oxide 250 MG TABS Take 250 mg by mouth daily.     meclizine (ANTIVERT) 25 MG tablet Take 25 mg by mouth 2 (two) times daily as needed for dizziness. For dizziness     melatonin 3 MG TABS tablet Take 3 mg by mouth at bedtime.     Menthol, Topical Analgesic, (BIOFREEZE) 4 % GEL Apply 1 application topically 3 (three) times daily as needed (bilateral leg pain).     methocarbamol (ROBAXIN) 500 MG tablet Take 1 tablet (500 mg total) by mouth every 8 (eight) hours as needed for muscle spasms.     nitrofurantoin, macrocrystal-monohydrate, (MACROBID) 100 MG capsule Take 100 mg by mouth 2 (two) times daily. For 10 days     nitroGLYCERIN (NITROSTAT) 0.4 MG SL tablet Place 1 tablet (0.4 mg total) under the tongue every 5 (five) minutes as needed. Chest pain. (Patient taking differently: Place 0.4 mg under the tongue every 5 (five) minutes as needed for chest pain.) 25 tablet 5   nystatin (MYCOSTATIN/NYSTOP) powder Apply 1 application topically 2 (two) times daily as needed (rash).     oxybutynin (DITROPAN-XL) 5 MG 24 hr tablet Take 5 mg by mouth daily.     Polyethyl Glycol-Propyl Glycol (SYSTANE) 0.4-0.3 % SOLN Apply 1-2 drops to eye 4 (four) times daily as needed (dry eye).     rosuvastatin (CRESTOR) 20 MG tablet Take 1 tablet (20 mg total) by mouth daily. 90  tablet 3   saccharomyces boulardii (FLORASTOR) 250 MG capsule Take 250 mg by mouth daily.     ticagrelor (BRILINTA) 90 MG TABS tablet Take 1 tablet (90 mg total) by mouth 2 (two) times daily. 60 tablet 0   valsartan (DIOVAN) 80 MG tablet Take 80 mg by mouth daily.     No current facility-administered medications on file prior to visit.    Allergies  Allergen Reactions   Codeine Other (See Comments)    Abnormal behavior    Latex Rash and Other (See Comments)    tears skin    Morphine And Related Other (See Comments)    Affects BP and blood sugar.   Cyclobenzaprine     MADE PT SICK- pt unsure if med was cyclobenzaprine or methocarbamol    Methocarbamol Other (See Comments)    MADE PT SICK- pt unsure if med was cyclobenzaprine or methocarbamol  11/09/20 pt currently takes methocarbamol   Social History   Occupational History   Not on file  Tobacco Use   Smoking status: Never   Smokeless tobacco: Never  Vaping Use   Vaping Use: Never used  Substance and Sexual Activity   Alcohol use: No    Alcohol/week: 0.0 standard drinks of alcohol   Drug use: No   Sexual activity: Never    Birth control/protection: Post-menopausal   Family History  Problem Relation Age of Onset   Heart attack Father    Heart disease Father 26       Before age of 67   Hyperlipidemia Father    Heart disease Brother        Heart dissease before age 60   Hyperlipidemia Brother    Cirrhosis Mother    Heart attack Paternal Grandfather    Heart disease Paternal Grandfather    Cancer Maternal Grandmother    Alcoholism Maternal Grandfather    Heart attack Paternal Grandmother    Immunization History  Administered Date(s) Administered   Influenza Split 02/10/2020   Influenza, High Dose  Seasonal PF 09/18/2018   Moderna Sars-Covid-2 Vaccination 02/09/2019, 03/08/2019   Pneumococcal Conjugate-13 11/16/2014   Pneumococcal Polysaccharide-23 09/01/2003, 10/26/2008   Td 10/31/2005   Tdap 12/15/2012,  11/09/2020   Zoster, Live 11/20/2012, 12/20/2018, 03/28/2019     Review of Systems: Negative except as noted in the HPI.   Objective: There were no vitals filed for this visit.  Onalee L Kirk is a pleasant 82 y.o. female in NAD. AAO X 3.  Vascular Examination: CFT <3 seconds b/l LE. Palpable PT pulse(s) b/l LE. Faintly palpable DP pulses b/l LE. Pedal hair absent. No pain with calf compression b/l. Lower extremity skin temperature gradient within normal limits. No edema noted b/l LE. Evidence of chronic venous insufficiency b/l LE. No ischemia or gangrene noted b/l LE. No cyanosis or clubbing noted b/l LE.  Dermatological Examination: Pedal skin thin, shiny and atrophic b/l LE. No open wounds b/l LE. No interdigital macerations noted b/l LE. Toenails 1-5 b/l elongated, discolored, dystrophic, thickened, crumbly with subungual debris and tenderness to dorsal palpation. No hyperkeratotic nor porokeratotic lesions present on today's visit.  Neurological Examination: Pt has subjective symptoms of neuropathy. Protective sensation intact 5/5 intact bilaterally with 10g monofilament b/l. Vibratory sensation intact b/l.  Musculoskeletal Examination: Muscle strength 5/5 to all lower extremity muscle groups bilaterally. No pain, crepitus or joint limitation noted with ROM bilateral LE. HAV with bunion deformity noted b/l LE. Utilizes wheelchair for mobility assistance.  Footwear Assessment: Does the patient wear appropriate shoes? Yes. Does the patient need inserts/orthotics? No.  Lab Results  Component Value Date   HGBA1C 6.1 (H) 06/23/2020   ADA Risk Categorization: Low Risk :  Patient has all of the following: Intact protective sensation No prior foot ulcer  No severe deformity Pedal pulses present  Assessment: 1. Pain due to onychomycosis of toenail   2. Hallux valgus, acquired, bilateral   3. Diabetic polyneuropathy associated with type 2 diabetes mellitus   4. Encounter for  diabetic foot exam     Plan: No orders of the defined types were placed in this encounter.  -Patient was evaluated and treated. All patient's and/or POA's questions/concerns answered on today's visit. -Diabetic foot examination performed today. -Continue diabetic foot care principles: inspect feet daily, monitor glucose as recommended by PCP and/or Endocrinologist, and follow prescribed diet per PCP, Endocrinologist and/or dietician. -Patient to continue soft, supportive shoe gear daily. -Toenails 1-5 b/l were debrided in length and girth with sterile nail nippers and dremel without iatrogenic bleeding.  -Patient/POA to call should there be question/concern in the interim. Return in about 3 months (around 07/22/2022).  Freddie Breech, DPM

## 2022-04-23 DIAGNOSIS — R2681 Unsteadiness on feet: Secondary | ICD-10-CM | POA: Diagnosis not present

## 2022-04-23 DIAGNOSIS — M6281 Muscle weakness (generalized): Secondary | ICD-10-CM | POA: Diagnosis not present

## 2022-04-23 DIAGNOSIS — J9601 Acute respiratory failure with hypoxia: Secondary | ICD-10-CM | POA: Diagnosis not present

## 2022-04-23 DIAGNOSIS — R1312 Dysphagia, oropharyngeal phase: Secondary | ICD-10-CM | POA: Diagnosis not present

## 2022-04-24 DIAGNOSIS — M6281 Muscle weakness (generalized): Secondary | ICD-10-CM | POA: Diagnosis not present

## 2022-04-24 DIAGNOSIS — J9601 Acute respiratory failure with hypoxia: Secondary | ICD-10-CM | POA: Diagnosis not present

## 2022-04-24 DIAGNOSIS — R2681 Unsteadiness on feet: Secondary | ICD-10-CM | POA: Diagnosis not present

## 2022-04-24 DIAGNOSIS — R1312 Dysphagia, oropharyngeal phase: Secondary | ICD-10-CM | POA: Diagnosis not present

## 2022-04-25 DIAGNOSIS — J9601 Acute respiratory failure with hypoxia: Secondary | ICD-10-CM | POA: Diagnosis not present

## 2022-04-25 DIAGNOSIS — R1312 Dysphagia, oropharyngeal phase: Secondary | ICD-10-CM | POA: Diagnosis not present

## 2022-04-25 DIAGNOSIS — M6281 Muscle weakness (generalized): Secondary | ICD-10-CM | POA: Diagnosis not present

## 2022-04-25 DIAGNOSIS — R2681 Unsteadiness on feet: Secondary | ICD-10-CM | POA: Diagnosis not present

## 2022-04-26 DIAGNOSIS — I951 Orthostatic hypotension: Secondary | ICD-10-CM | POA: Diagnosis not present

## 2022-04-26 DIAGNOSIS — M6281 Muscle weakness (generalized): Secondary | ICD-10-CM | POA: Diagnosis not present

## 2022-04-26 DIAGNOSIS — N390 Urinary tract infection, site not specified: Secondary | ICD-10-CM | POA: Diagnosis not present

## 2022-04-26 DIAGNOSIS — R1312 Dysphagia, oropharyngeal phase: Secondary | ICD-10-CM | POA: Diagnosis not present

## 2022-04-26 DIAGNOSIS — R2681 Unsteadiness on feet: Secondary | ICD-10-CM | POA: Diagnosis not present

## 2022-04-26 DIAGNOSIS — I2510 Atherosclerotic heart disease of native coronary artery without angina pectoris: Secondary | ICD-10-CM | POA: Diagnosis not present

## 2022-04-26 DIAGNOSIS — J9601 Acute respiratory failure with hypoxia: Secondary | ICD-10-CM | POA: Diagnosis not present

## 2022-04-29 DIAGNOSIS — G894 Chronic pain syndrome: Secondary | ICD-10-CM | POA: Diagnosis not present

## 2022-04-29 DIAGNOSIS — I5032 Chronic diastolic (congestive) heart failure: Secondary | ICD-10-CM | POA: Diagnosis not present

## 2022-04-29 DIAGNOSIS — J9601 Acute respiratory failure with hypoxia: Secondary | ICD-10-CM | POA: Diagnosis not present

## 2022-04-29 DIAGNOSIS — I129 Hypertensive chronic kidney disease with stage 1 through stage 4 chronic kidney disease, or unspecified chronic kidney disease: Secondary | ICD-10-CM | POA: Diagnosis not present

## 2022-04-29 DIAGNOSIS — E119 Type 2 diabetes mellitus without complications: Secondary | ICD-10-CM | POA: Diagnosis not present

## 2022-04-29 DIAGNOSIS — M6281 Muscle weakness (generalized): Secondary | ICD-10-CM | POA: Diagnosis not present

## 2022-04-29 DIAGNOSIS — R1312 Dysphagia, oropharyngeal phase: Secondary | ICD-10-CM | POA: Diagnosis not present

## 2022-04-29 DIAGNOSIS — R2681 Unsteadiness on feet: Secondary | ICD-10-CM | POA: Diagnosis not present

## 2022-04-30 DIAGNOSIS — R2681 Unsteadiness on feet: Secondary | ICD-10-CM | POA: Diagnosis not present

## 2022-04-30 DIAGNOSIS — J9601 Acute respiratory failure with hypoxia: Secondary | ICD-10-CM | POA: Diagnosis not present

## 2022-04-30 DIAGNOSIS — R1312 Dysphagia, oropharyngeal phase: Secondary | ICD-10-CM | POA: Diagnosis not present

## 2022-04-30 DIAGNOSIS — M6281 Muscle weakness (generalized): Secondary | ICD-10-CM | POA: Diagnosis not present

## 2022-05-01 DIAGNOSIS — J9601 Acute respiratory failure with hypoxia: Secondary | ICD-10-CM | POA: Diagnosis not present

## 2022-05-01 DIAGNOSIS — M6281 Muscle weakness (generalized): Secondary | ICD-10-CM | POA: Diagnosis not present

## 2022-05-01 DIAGNOSIS — R2681 Unsteadiness on feet: Secondary | ICD-10-CM | POA: Diagnosis not present

## 2022-05-01 DIAGNOSIS — R1312 Dysphagia, oropharyngeal phase: Secondary | ICD-10-CM | POA: Diagnosis not present

## 2022-05-02 DIAGNOSIS — R059 Cough, unspecified: Secondary | ICD-10-CM | POA: Diagnosis not present

## 2022-05-02 DIAGNOSIS — I517 Cardiomegaly: Secondary | ICD-10-CM | POA: Diagnosis not present

## 2022-05-02 DIAGNOSIS — I129 Hypertensive chronic kidney disease with stage 1 through stage 4 chronic kidney disease, or unspecified chronic kidney disease: Secondary | ICD-10-CM | POA: Diagnosis not present

## 2022-05-02 DIAGNOSIS — I5032 Chronic diastolic (congestive) heart failure: Secondary | ICD-10-CM | POA: Diagnosis not present

## 2022-05-02 DIAGNOSIS — E119 Type 2 diabetes mellitus without complications: Secondary | ICD-10-CM | POA: Diagnosis not present

## 2022-05-03 DIAGNOSIS — R1312 Dysphagia, oropharyngeal phase: Secondary | ICD-10-CM | POA: Diagnosis not present

## 2022-05-03 DIAGNOSIS — M6281 Muscle weakness (generalized): Secondary | ICD-10-CM | POA: Diagnosis not present

## 2022-05-03 DIAGNOSIS — R2681 Unsteadiness on feet: Secondary | ICD-10-CM | POA: Diagnosis not present

## 2022-05-03 DIAGNOSIS — J9601 Acute respiratory failure with hypoxia: Secondary | ICD-10-CM | POA: Diagnosis not present

## 2022-05-06 DIAGNOSIS — J9601 Acute respiratory failure with hypoxia: Secondary | ICD-10-CM | POA: Diagnosis not present

## 2022-05-06 DIAGNOSIS — R1312 Dysphagia, oropharyngeal phase: Secondary | ICD-10-CM | POA: Diagnosis not present

## 2022-05-06 DIAGNOSIS — R2681 Unsteadiness on feet: Secondary | ICD-10-CM | POA: Diagnosis not present

## 2022-05-06 DIAGNOSIS — G894 Chronic pain syndrome: Secondary | ICD-10-CM | POA: Diagnosis not present

## 2022-05-06 DIAGNOSIS — J209 Acute bronchitis, unspecified: Secondary | ICD-10-CM | POA: Diagnosis not present

## 2022-05-06 DIAGNOSIS — I5032 Chronic diastolic (congestive) heart failure: Secondary | ICD-10-CM | POA: Diagnosis not present

## 2022-05-06 DIAGNOSIS — M6281 Muscle weakness (generalized): Secondary | ICD-10-CM | POA: Diagnosis not present

## 2022-05-06 DIAGNOSIS — I129 Hypertensive chronic kidney disease with stage 1 through stage 4 chronic kidney disease, or unspecified chronic kidney disease: Secondary | ICD-10-CM | POA: Diagnosis not present

## 2022-05-07 DIAGNOSIS — R1312 Dysphagia, oropharyngeal phase: Secondary | ICD-10-CM | POA: Diagnosis not present

## 2022-05-07 DIAGNOSIS — M6281 Muscle weakness (generalized): Secondary | ICD-10-CM | POA: Diagnosis not present

## 2022-05-07 DIAGNOSIS — J9601 Acute respiratory failure with hypoxia: Secondary | ICD-10-CM | POA: Diagnosis not present

## 2022-05-07 DIAGNOSIS — F5101 Primary insomnia: Secondary | ICD-10-CM | POA: Diagnosis not present

## 2022-05-07 DIAGNOSIS — R2681 Unsteadiness on feet: Secondary | ICD-10-CM | POA: Diagnosis not present

## 2022-05-08 DIAGNOSIS — R2681 Unsteadiness on feet: Secondary | ICD-10-CM | POA: Diagnosis not present

## 2022-05-08 DIAGNOSIS — J9601 Acute respiratory failure with hypoxia: Secondary | ICD-10-CM | POA: Diagnosis not present

## 2022-05-08 DIAGNOSIS — R1312 Dysphagia, oropharyngeal phase: Secondary | ICD-10-CM | POA: Diagnosis not present

## 2022-05-08 DIAGNOSIS — M6281 Muscle weakness (generalized): Secondary | ICD-10-CM | POA: Diagnosis not present

## 2022-05-08 DIAGNOSIS — R2689 Other abnormalities of gait and mobility: Secondary | ICD-10-CM | POA: Diagnosis not present

## 2022-05-09 DIAGNOSIS — R2681 Unsteadiness on feet: Secondary | ICD-10-CM | POA: Diagnosis not present

## 2022-05-09 DIAGNOSIS — J209 Acute bronchitis, unspecified: Secondary | ICD-10-CM | POA: Diagnosis not present

## 2022-05-09 DIAGNOSIS — R1312 Dysphagia, oropharyngeal phase: Secondary | ICD-10-CM | POA: Diagnosis not present

## 2022-05-09 DIAGNOSIS — G894 Chronic pain syndrome: Secondary | ICD-10-CM | POA: Diagnosis not present

## 2022-05-09 DIAGNOSIS — F32A Depression, unspecified: Secondary | ICD-10-CM | POA: Diagnosis not present

## 2022-05-09 DIAGNOSIS — E119 Type 2 diabetes mellitus without complications: Secondary | ICD-10-CM | POA: Diagnosis not present

## 2022-05-09 DIAGNOSIS — J9601 Acute respiratory failure with hypoxia: Secondary | ICD-10-CM | POA: Diagnosis not present

## 2022-05-09 DIAGNOSIS — M6281 Muscle weakness (generalized): Secondary | ICD-10-CM | POA: Diagnosis not present

## 2022-05-09 DIAGNOSIS — R2689 Other abnormalities of gait and mobility: Secondary | ICD-10-CM | POA: Diagnosis not present

## 2022-05-09 DIAGNOSIS — I129 Hypertensive chronic kidney disease with stage 1 through stage 4 chronic kidney disease, or unspecified chronic kidney disease: Secondary | ICD-10-CM | POA: Diagnosis not present

## 2022-05-10 DIAGNOSIS — R1312 Dysphagia, oropharyngeal phase: Secondary | ICD-10-CM | POA: Diagnosis not present

## 2022-05-10 DIAGNOSIS — M6281 Muscle weakness (generalized): Secondary | ICD-10-CM | POA: Diagnosis not present

## 2022-05-10 DIAGNOSIS — R2681 Unsteadiness on feet: Secondary | ICD-10-CM | POA: Diagnosis not present

## 2022-05-10 DIAGNOSIS — J9601 Acute respiratory failure with hypoxia: Secondary | ICD-10-CM | POA: Diagnosis not present

## 2022-05-10 DIAGNOSIS — R059 Cough, unspecified: Secondary | ICD-10-CM | POA: Diagnosis not present

## 2022-05-10 DIAGNOSIS — R2689 Other abnormalities of gait and mobility: Secondary | ICD-10-CM | POA: Diagnosis not present

## 2022-05-13 DIAGNOSIS — J9601 Acute respiratory failure with hypoxia: Secondary | ICD-10-CM | POA: Diagnosis not present

## 2022-05-13 DIAGNOSIS — R2689 Other abnormalities of gait and mobility: Secondary | ICD-10-CM | POA: Diagnosis not present

## 2022-05-13 DIAGNOSIS — R1312 Dysphagia, oropharyngeal phase: Secondary | ICD-10-CM | POA: Diagnosis not present

## 2022-05-13 DIAGNOSIS — M6281 Muscle weakness (generalized): Secondary | ICD-10-CM | POA: Diagnosis not present

## 2022-05-13 DIAGNOSIS — R2681 Unsteadiness on feet: Secondary | ICD-10-CM | POA: Diagnosis not present

## 2022-05-14 DIAGNOSIS — R2689 Other abnormalities of gait and mobility: Secondary | ICD-10-CM | POA: Diagnosis not present

## 2022-05-14 DIAGNOSIS — R1312 Dysphagia, oropharyngeal phase: Secondary | ICD-10-CM | POA: Diagnosis not present

## 2022-05-14 DIAGNOSIS — M6281 Muscle weakness (generalized): Secondary | ICD-10-CM | POA: Diagnosis not present

## 2022-05-14 DIAGNOSIS — R2681 Unsteadiness on feet: Secondary | ICD-10-CM | POA: Diagnosis not present

## 2022-05-14 DIAGNOSIS — J9601 Acute respiratory failure with hypoxia: Secondary | ICD-10-CM | POA: Diagnosis not present

## 2022-05-15 DIAGNOSIS — R2681 Unsteadiness on feet: Secondary | ICD-10-CM | POA: Diagnosis not present

## 2022-05-15 DIAGNOSIS — M6281 Muscle weakness (generalized): Secondary | ICD-10-CM | POA: Diagnosis not present

## 2022-05-15 DIAGNOSIS — J9601 Acute respiratory failure with hypoxia: Secondary | ICD-10-CM | POA: Diagnosis not present

## 2022-05-15 DIAGNOSIS — R2689 Other abnormalities of gait and mobility: Secondary | ICD-10-CM | POA: Diagnosis not present

## 2022-05-15 DIAGNOSIS — R1312 Dysphagia, oropharyngeal phase: Secondary | ICD-10-CM | POA: Diagnosis not present

## 2022-05-16 DIAGNOSIS — E119 Type 2 diabetes mellitus without complications: Secondary | ICD-10-CM | POA: Diagnosis not present

## 2022-05-16 DIAGNOSIS — R2689 Other abnormalities of gait and mobility: Secondary | ICD-10-CM | POA: Diagnosis not present

## 2022-05-16 DIAGNOSIS — I129 Hypertensive chronic kidney disease with stage 1 through stage 4 chronic kidney disease, or unspecified chronic kidney disease: Secondary | ICD-10-CM | POA: Diagnosis not present

## 2022-05-16 DIAGNOSIS — R059 Cough, unspecified: Secondary | ICD-10-CM | POA: Diagnosis not present

## 2022-05-16 DIAGNOSIS — R2681 Unsteadiness on feet: Secondary | ICD-10-CM | POA: Diagnosis not present

## 2022-05-16 DIAGNOSIS — I5032 Chronic diastolic (congestive) heart failure: Secondary | ICD-10-CM | POA: Diagnosis not present

## 2022-05-16 DIAGNOSIS — G894 Chronic pain syndrome: Secondary | ICD-10-CM | POA: Diagnosis not present

## 2022-05-16 DIAGNOSIS — M6281 Muscle weakness (generalized): Secondary | ICD-10-CM | POA: Diagnosis not present

## 2022-05-16 DIAGNOSIS — R1312 Dysphagia, oropharyngeal phase: Secondary | ICD-10-CM | POA: Diagnosis not present

## 2022-05-16 DIAGNOSIS — J9601 Acute respiratory failure with hypoxia: Secondary | ICD-10-CM | POA: Diagnosis not present

## 2022-05-17 DIAGNOSIS — M6281 Muscle weakness (generalized): Secondary | ICD-10-CM | POA: Diagnosis not present

## 2022-05-17 DIAGNOSIS — J9601 Acute respiratory failure with hypoxia: Secondary | ICD-10-CM | POA: Diagnosis not present

## 2022-05-17 DIAGNOSIS — R1312 Dysphagia, oropharyngeal phase: Secondary | ICD-10-CM | POA: Diagnosis not present

## 2022-05-17 DIAGNOSIS — R2681 Unsteadiness on feet: Secondary | ICD-10-CM | POA: Diagnosis not present

## 2022-05-17 DIAGNOSIS — R2689 Other abnormalities of gait and mobility: Secondary | ICD-10-CM | POA: Diagnosis not present

## 2022-05-17 NOTE — Progress Notes (Unsigned)
Cardiology Office Note:    Date:  05/21/2022   ID:  Andrea Kirk, DOB 1940-11-08, MRN [id number]  PCP:  Georgann Housekeeper, MD  [facility] Providers Cardiologist:  Lance Muss, MD     Referring MD: Henrine Screws, MD   Chief Complaint:  Follow-up     History of Present Illness:   Andrea Kirk is a 82 y.o. female with  history of CAD s/p CABG 1997, RCE 2018, HTN, DM, chronic diastolic CHF, multiple falls, sepsis 02/2021 with echo normal LVEF mild LVH.     Patient last saw Dr. Eldridge Dace 05/2021 and was stable.   Patient comes in alone from a facility. Getting over an URI- was coughing a lot. Starting to feel better. Hearing is still off. Going to see ENT. Denies chest pain, dyspnea. In a wheel chair today but walks with a cane or walker. No fluid build up.       Past Medical History:  Diagnosis Date   Acute kidney injury (HCC) 08/05/2016   Aftercare following surgery of the circulatory system, NEC 05/11/2013   AKI (acute kidney injury) (HCC) 08/04/2016   Anxiety    takes Celexa daily   ARF (acute renal failure) (HCC) 11/07/2016   Asymptomatic stenosis of right carotid artery 03/22/2016   Back pain    occasionally   Carotid artery disease (HCC) 04/16/2013   Carotid artery occlusion    Carotid stenosis 11/16/2013   Cataract    left and immature   Coronary artery disease    Coronary atherosclerosis of native coronary artery 03/11/2013   S/p CABG in 1997    Depression    Diabetes mellitus    takes Metformin and Glipizide daily   Diabetes mellitus (HCC) 05/02/2015   Dizziness    takes Meclizine daily as needed   Essential hypertension, benign 03/11/2013   GERD (gastroesophageal reflux disease)    takes Omeprazole daily as needed   Headache(784.0)    Hyperlipidemia    takes Atorvastatin daily   Hypertension    takes Carvedilol daily   Mixed hyperlipidemia 03/11/2013   Muscle spasm    takes Robaxin daily as needed   Nausea    takes Phenergan daily as needed    Occlusion and stenosis of carotid artery without mention of cerebral infarction 07/19/2011   Pneumonia    hx of-in high school   Restless leg    takes Requip daily as needed   Seasonal allergies    takes Claritin daily as needed and Afrin as needed   Shortness of breath    with exertion   Urinary urgency    UTI (urinary tract infection) 11/07/2016   Current Medications: Current Meds  Medication Sig   acetaminophen (TYLENOL) 325 MG tablet Take 650 mg by mouth every 6 (six) hours as needed for moderate pain.   amLODipine (NORVASC) 5 MG tablet Take 1 tablet (5 mg total) by mouth daily.   Ascorbic Acid (VITAMIN C) 1000 MG tablet Take 1,000 mg by mouth daily.   aspirin EC 81 MG tablet Take 81 mg by mouth every evening.    busPIRone (BUSPAR) 7.5 MG tablet Take 7.5 mg by mouth 2 (two) times daily.   citalopram (CELEXA) 20 MG tablet Take 20 mg by mouth daily.   Cyanocobalamin (B-12) 1000 MCG TABS Take 1,000 mcg by mouth daily.   docusate sodium (COLACE) 100 MG capsule Take 100 mg by mouth 2 (two) times daily.   ferrous sulfate 325 (65 FE) MG tablet Take  325 mg by mouth daily.   furosemide (LASIX) 20 MG tablet Take 20 mg by mouth daily as needed for edema.   gabapentin (NEURONTIN) 100 MG capsule Take 1 capsule (100 mg total) by mouth 3 (three) times daily.   glipiZIDE (GLUCOTROL XL) 5 MG 24 hr tablet Take 1 tablet (5 mg total) by mouth daily with breakfast.   HYDROcodone-acetaminophen (NORCO/VICODIN) 5-325 MG tablet Take 1 tablet by mouth daily as needed (breakthrough pain). Also takes 10/325mg  twice daily scheduled   Infant Care Products [facility]) OINT Apply 1 application. topically as directed.   LAGEVRIO 200 MG CAPS capsule Take by mouth.   lidocaine (LIDODERM) 5 % Place 1 patch onto the skin daily. Apply to back and left leg for 12 hours per day for pain. Remove every morning.   loperamide (IMODIUM) 2 MG capsule Take 1 capsule (2 mg total) by mouth every 6 (six) hours as needed for  diarrhea or loose stools.   Magnesium Oxide 250 MG TABS Take 250 mg by mouth daily.   meclizine (ANTIVERT) 25 MG tablet Take 25 mg by mouth 2 (two) times daily as needed for dizziness. For dizziness   melatonin 3 MG TABS tablet Take 3 mg by mouth at bedtime.   Menthol, Topical Analgesic, (BIOFREEZE) 4 % GEL Apply 1 application topically 3 (three) times daily as needed (bilateral leg pain).   methocarbamol (ROBAXIN) 500 MG tablet Take 1 tablet (500 mg total) by mouth every 8 (eight) hours as needed for muscle spasms.   nitrofurantoin, macrocrystal-monohydrate, (MACROBID) 100 MG capsule Take 100 mg by mouth 2 (two) times daily. For 10 days   nitroGLYCERIN (NITROSTAT) 0.4 MG SL tablet Place 1 tablet (0.4 mg total) under the tongue every 5 (five) minutes as needed. Chest pain. (Patient taking differently: Place 0.4 mg under the tongue every 5 (five) minutes as needed for chest pain.)   nystatin (MYCOSTATIN/NYSTOP) powder Apply 1 application topically 2 (two) times daily as needed (rash).   oxybutynin (DITROPAN-XL) 5 MG 24 hr tablet Take 5 mg by mouth daily.   Polyethyl Glycol-Propyl Glycol (SYSTANE) 0.4-0.3 % SOLN Apply 1-2 drops to eye 4 (four) times daily as needed (dry eye).   rosuvastatin (CRESTOR) 20 MG tablet Take 1 tablet (20 mg total) by mouth daily.   saccharomyces boulardii (FLORASTOR) 250 MG capsule Take 250 mg by mouth daily.   ticagrelor (BRILINTA) 90 MG TABS tablet Take 1 tablet (90 mg total) by mouth 2 (two) times daily.   valsartan (DIOVAN) 80 MG tablet Take 80 mg by mouth daily.   [DISCONTINUED] lisinopril (ZESTRIL) 5 MG tablet Take 5 mg by mouth daily.    Allergies:   Codeine, Latex, Morphine and related, Cyclobenzaprine, and Methocarbamol   Social History   Tobacco Use   Smoking status: Never   Smokeless tobacco: Never  Vaping Use   Vaping Use: Never used  Substance Use Topics   Alcohol use: No    Alcohol/week: 0.0 standard drinks of alcohol   Drug use: No    Family  Hx: The patient's family history includes Alcoholism in her maternal grandfather; Cancer in her maternal grandmother; Cirrhosis in her mother; Heart attack in her father, paternal grandfather, and paternal grandmother; Heart disease in her brother and paternal grandfather; Heart disease (age of onset: 57) in her father; Hyperlipidemia in her brother and father.  ROS     Physical Exam:    VS:  BP 128/74   Pulse 60   Ht 5\' 5"  (1.651 m)  Wt 177 lb (80.3 kg)   SpO2 98%   BMI 29.45 kg/m     Wt Readings from Last 3 Encounters:  05/21/22 177 lb (80.3 kg)  01/31/22 181 lb (82.1 kg)  05/08/21 161 lb (73 kg)    Physical Exam  GEN: Well nourished, well developed, in no acute distress  Neck: bilateral carotic bruits no JVD or masses Cardiac:RRR; no murmurs, rubs, or gallops  Respiratory:  clear to auscultation bilaterally, normal work of breathing GI: soft, nontender, nondistended, + BS Ext: without cyanosis, clubbing, or edema, Good distal pulses bilaterally Neuro:  Alert and Oriented x 3,  Psych: euthymic mood, full affect        EKGs/Labs/Other Test Reviewed:    EKG:  EKG is   ordered today.  The ekg ordered today demonstrates NSR with nonspecific ST changes  Recent Labs: No results found for requested labs within last 365 days.   Recent Lipid Panel No results for input(s): "CHOL", "TRIG", "HDL", "VLDL", "LDLCALC", "LDLDIRECT" in the last 8760 hours.   Prior CV Studies:     Carotid dopplers 01/31/2022 Summary:  Right Carotid: Velocities in the right ICA are consistent with a 1-39%  stenosis.                The ECA appears >50% stenosed.   Left Carotid: Velocities in the left ICA are consistent with a 40-59%  stenosis.   Vertebrals: Bilateral vertebral arteries demonstrate antegrade flow.  Subclavians: Left subclavian artery flow was disturbed. Normal flow  hemodynamics              were seen in bilateral subclavian arteries.   *See table(s) above for measurements  and observations.     Echo in 2/23  showed: "Left ventricular ejection fraction, by estimation, is 50 to 55%. The  left ventricle has low normal function. The left ventricle has no regional  wall motion abnormalities. There is mild concentric left ventricular  hypertrophy. Left ventricular  diastolic parameters are consistent with Grade III diastolic dysfunction  (restrictive). Elevated left ventricular end-diastolic pressure.   2. Right ventricular systolic function is normal. The right ventricular  size is mildly enlarged. There is severely elevated pulmonary artery  systolic pressure. The estimated right ventricular systolic pressure is  60.1 mmHg.   3. Left atrial size was severely dilated.   4. Right atrial size was moderately dilated.   5. The mitral valve is grossly normal. Mild mitral valve regurgitation.  No evidence of mitral stenosis. Moderate mitral annular calcification.   6. Tricuspid valve regurgitation is moderate.   7. The aortic valve is tricuspid. There is mild calcification of the  aortic valve. There is mild thickening of the aortic valve. Aortic valve  regurgitation is not visualized. Aortic valve sclerosis/calcification is  present, without any evidence of  aortic stenosis.   8. The inferior vena cava is normal in size with <50% respiratory  variability, suggesting right atrial pressure of 8 mmHg."  Risk Assessment/Calculations/Metrics:              ASSESSMENT & PLAN:   No problem-specific Assessment & Plan notes found for this encounter.   CAD S/P CABG 1997: No angina. Continue aggressive secondary prevention. On Brilinata and ASA  Carotid artery disease: 01/2022: LICA 40-59% RICA 1-39% Followed at [facility]. Continue statin.  rosuvastatin 20 mg daily.   Falls: Care to avoid falls. Working with PT per her report. No recent falls.  DM: A1C over 8 and checking sugars regularly  High fiber diet. COntinue medications.  Chronic diastolic heart failure: -  euvolemic.   HTN: well controlled;  continue present plan and medications-valsartan, amlodipine, coreg, lasix              Dispo:  No follow-ups on file.   Medication Adjustments/Labs and Tests Ordered: Current medicines are reviewed at length with the patient today.  Concerns regarding medicines are outlined above.  Tests Ordered: No orders of the defined types were placed in this encounter.  Medication Changes: No orders of the defined types were placed in this encounter.  Elson Clan, PA-C  05/21/2022 12:23 PM    [facility] [address] [place], [place], [place]  [address] Phone: [phone]; Fax: [phone]

## 2022-05-20 DIAGNOSIS — I5032 Chronic diastolic (congestive) heart failure: Secondary | ICD-10-CM | POA: Diagnosis not present

## 2022-05-20 DIAGNOSIS — E119 Type 2 diabetes mellitus without complications: Secondary | ICD-10-CM | POA: Diagnosis not present

## 2022-05-20 DIAGNOSIS — R1312 Dysphagia, oropharyngeal phase: Secondary | ICD-10-CM | POA: Diagnosis not present

## 2022-05-20 DIAGNOSIS — G894 Chronic pain syndrome: Secondary | ICD-10-CM | POA: Diagnosis not present

## 2022-05-20 DIAGNOSIS — M6281 Muscle weakness (generalized): Secondary | ICD-10-CM | POA: Diagnosis not present

## 2022-05-20 DIAGNOSIS — R2681 Unsteadiness on feet: Secondary | ICD-10-CM | POA: Diagnosis not present

## 2022-05-20 DIAGNOSIS — J209 Acute bronchitis, unspecified: Secondary | ICD-10-CM | POA: Diagnosis not present

## 2022-05-20 DIAGNOSIS — I129 Hypertensive chronic kidney disease with stage 1 through stage 4 chronic kidney disease, or unspecified chronic kidney disease: Secondary | ICD-10-CM | POA: Diagnosis not present

## 2022-05-20 DIAGNOSIS — J9601 Acute respiratory failure with hypoxia: Secondary | ICD-10-CM | POA: Diagnosis not present

## 2022-05-20 DIAGNOSIS — R2689 Other abnormalities of gait and mobility: Secondary | ICD-10-CM | POA: Diagnosis not present

## 2022-05-21 ENCOUNTER — Ambulatory Visit: Payer: Medicare Other | Attending: Physician Assistant | Admitting: Physician Assistant

## 2022-05-21 ENCOUNTER — Encounter: Payer: Self-pay | Admitting: Physician Assistant

## 2022-05-21 VITALS — BP 128/74 | HR 60 | Ht 65.0 in | Wt 177.0 lb

## 2022-05-21 DIAGNOSIS — Z7984 Long term (current) use of oral hypoglycemic drugs: Secondary | ICD-10-CM | POA: Diagnosis not present

## 2022-05-21 DIAGNOSIS — I5032 Chronic diastolic (congestive) heart failure: Secondary | ICD-10-CM | POA: Diagnosis not present

## 2022-05-21 DIAGNOSIS — I10 Essential (primary) hypertension: Secondary | ICD-10-CM | POA: Diagnosis not present

## 2022-05-21 DIAGNOSIS — E1159 Type 2 diabetes mellitus with other circulatory complications: Secondary | ICD-10-CM

## 2022-05-21 DIAGNOSIS — I779 Disorder of arteries and arterioles, unspecified: Secondary | ICD-10-CM | POA: Diagnosis not present

## 2022-05-21 DIAGNOSIS — I25119 Atherosclerotic heart disease of native coronary artery with unspecified angina pectoris: Secondary | ICD-10-CM

## 2022-05-21 NOTE — Patient Instructions (Signed)
Medication Instructions:  Your physician recommends that you continue on your current medications as directed. Please refer to the Current Medication list given to you today.   *If you need a refill on your cardiac medications before your next appointment, please call your pharmacy*   Lab Work: Cmp, Cbc, Lipids - Today   If you have labs (blood work) drawn today and your tests are completely normal, you will receive your results only by: MyChart Message (if you have MyChart) OR A paper copy in the mail If you have any lab test that is abnormal or we need to change your treatment, we will call you to review the results.   Testing/Procedures: None ordered    Follow-Up: At [facility], you and your health needs are our priority.  As part of our continuing mission to provide you with exceptional heart care, we have created designated Provider Care Teams.  These Care Teams include your primary Cardiologist (physician) and Advanced Practice Providers (APPs -  Physician Assistants and Nurse Practitioners) who all work together to provide you with the care you need, when you need it.  We recommend signing up for the patient portal called "MyChart".  Sign up information is provided on this After Visit Summary.  MyChart is used to connect with patients for Virtual Visits (Telemedicine).  Patients are able to view lab/test results, encounter notes, upcoming appointments, etc.  Non-urgent messages can be sent to your provider as well.   To learn more about what you can do with MyChart, go to [email or web address].    Your next appointment:   6 month(s)  Provider:   Lance Muss, MD     Other Instructions

## 2022-05-22 DIAGNOSIS — R2689 Other abnormalities of gait and mobility: Secondary | ICD-10-CM | POA: Diagnosis not present

## 2022-05-22 DIAGNOSIS — R1312 Dysphagia, oropharyngeal phase: Secondary | ICD-10-CM | POA: Diagnosis not present

## 2022-05-22 DIAGNOSIS — J9601 Acute respiratory failure with hypoxia: Secondary | ICD-10-CM | POA: Diagnosis not present

## 2022-05-22 DIAGNOSIS — R2681 Unsteadiness on feet: Secondary | ICD-10-CM | POA: Diagnosis not present

## 2022-05-22 DIAGNOSIS — M6281 Muscle weakness (generalized): Secondary | ICD-10-CM | POA: Diagnosis not present

## 2022-05-22 LAB — CBC
Hematocrit: 36 % (ref 34.0–46.6)
Hemoglobin: 12.2 g/dL (ref 11.1–15.9)
MCH: 36 pg — ABNORMAL HIGH (ref 26.6–33.0)
MCHC: 33.9 g/dL (ref 31.5–35.7)
MCV: 106 fL — ABNORMAL HIGH (ref 79–97)
Platelets: 136 10*3/uL — ABNORMAL LOW (ref 150–450)
RBC: 3.39 x10E6/uL — ABNORMAL LOW (ref 3.77–5.28)
RDW: 13 % (ref 11.7–15.4)
WBC: 6.9 10*3/uL (ref 3.4–10.8)

## 2022-05-22 LAB — LIPID PANEL
Chol/HDL Ratio: 5.6 ratio — ABNORMAL HIGH (ref 0.0–4.4)
Cholesterol, Total: 128 mg/dL (ref 100–199)
HDL: 23 mg/dL — ABNORMAL LOW (ref >39)
LDL Chol Calc (NIH): 55 mg/dL (ref 0–99)
Triglycerides: 322 mg/dL — ABNORMAL HIGH (ref 0–149)
VLDL Cholesterol Cal: 50 mg/dL — ABNORMAL HIGH (ref 5–40)

## 2022-05-22 LAB — COMPREHENSIVE METABOLIC PANEL
ALT: 18 IU/L (ref 0–32)
AST: 18 IU/L (ref 0–40)
Albumin/Globulin Ratio: 1.4 (ref 1.2–2.2)
Albumin: 4.2 g/dL (ref 3.7–4.7)
Alkaline Phosphatase: 96 IU/L (ref 44–121)
BUN/Creatinine Ratio: 31 — ABNORMAL HIGH (ref 12–28)
BUN: 27 mg/dL (ref 8–27)
Bilirubin Total: 0.3 mg/dL (ref 0.0–1.2)
CO2: 23 mmol/L (ref 20–29)
Calcium: 9.8 mg/dL (ref 8.7–10.3)
Chloride: 104 mmol/L (ref 96–106)
Creatinine, Ser: 0.88 mg/dL (ref 0.57–1.00)
Globulin, Total: 2.9 g/dL (ref 1.5–4.5)
Glucose: 207 mg/dL — ABNORMAL HIGH (ref 70–99)
Potassium: 4.4 mmol/L (ref 3.5–5.2)
Sodium: 142 mmol/L (ref 134–144)
Total Protein: 7.1 g/dL (ref 6.0–8.5)
eGFR: 66 mL/min/{1.73_m2} (ref >59)

## 2022-05-23 ENCOUNTER — Telehealth: Payer: Self-pay | Admitting: Interventional Cardiology

## 2022-05-23 DIAGNOSIS — J9601 Acute respiratory failure with hypoxia: Secondary | ICD-10-CM | POA: Diagnosis not present

## 2022-05-23 DIAGNOSIS — R1312 Dysphagia, oropharyngeal phase: Secondary | ICD-10-CM | POA: Diagnosis not present

## 2022-05-23 DIAGNOSIS — M6281 Muscle weakness (generalized): Secondary | ICD-10-CM | POA: Diagnosis not present

## 2022-05-23 DIAGNOSIS — R2681 Unsteadiness on feet: Secondary | ICD-10-CM | POA: Diagnosis not present

## 2022-05-23 DIAGNOSIS — R2689 Other abnormalities of gait and mobility: Secondary | ICD-10-CM | POA: Diagnosis not present

## 2022-05-23 NOTE — Telephone Encounter (Signed)
Patient aware of results.

## 2022-05-23 NOTE — Telephone Encounter (Signed)
  Patient is returning call for lab results 

## 2022-05-24 DIAGNOSIS — J9601 Acute respiratory failure with hypoxia: Secondary | ICD-10-CM | POA: Diagnosis not present

## 2022-05-24 DIAGNOSIS — R2689 Other abnormalities of gait and mobility: Secondary | ICD-10-CM | POA: Diagnosis not present

## 2022-05-24 DIAGNOSIS — R1312 Dysphagia, oropharyngeal phase: Secondary | ICD-10-CM | POA: Diagnosis not present

## 2022-05-24 DIAGNOSIS — R2681 Unsteadiness on feet: Secondary | ICD-10-CM | POA: Diagnosis not present

## 2022-05-24 DIAGNOSIS — R059 Cough, unspecified: Secondary | ICD-10-CM | POA: Diagnosis not present

## 2022-05-24 DIAGNOSIS — M6281 Muscle weakness (generalized): Secondary | ICD-10-CM | POA: Diagnosis not present

## 2022-05-25 DIAGNOSIS — R2681 Unsteadiness on feet: Secondary | ICD-10-CM | POA: Diagnosis not present

## 2022-05-25 DIAGNOSIS — J9601 Acute respiratory failure with hypoxia: Secondary | ICD-10-CM | POA: Diagnosis not present

## 2022-05-25 DIAGNOSIS — M6281 Muscle weakness (generalized): Secondary | ICD-10-CM | POA: Diagnosis not present

## 2022-05-25 DIAGNOSIS — R2689 Other abnormalities of gait and mobility: Secondary | ICD-10-CM | POA: Diagnosis not present

## 2022-05-25 DIAGNOSIS — R1312 Dysphagia, oropharyngeal phase: Secondary | ICD-10-CM | POA: Diagnosis not present

## 2022-05-28 ENCOUNTER — Emergency Department (HOSPITAL_COMMUNITY): Payer: Medicare Other

## 2022-05-28 ENCOUNTER — Encounter (HOSPITAL_COMMUNITY): Payer: Self-pay

## 2022-05-28 ENCOUNTER — Inpatient Hospital Stay (HOSPITAL_COMMUNITY): Payer: Medicare Other

## 2022-05-28 ENCOUNTER — Other Ambulatory Visit: Payer: Self-pay

## 2022-05-28 ENCOUNTER — Inpatient Hospital Stay (HOSPITAL_COMMUNITY)
Admission: EM | Admit: 2022-05-28 | Discharge: 2022-05-31 | DRG: 872 | Disposition: A | Discharge status: Skilled Nursing Facility | Payer: Medicare Other | Attending: Internal Medicine | Admitting: Internal Medicine

## 2022-05-28 DIAGNOSIS — R0902 Hypoxemia: Secondary | ICD-10-CM | POA: Diagnosis present

## 2022-05-28 DIAGNOSIS — Z951 Presence of aortocoronary bypass graft: Secondary | ICD-10-CM | POA: Diagnosis not present

## 2022-05-28 DIAGNOSIS — Z947 Corneal transplant status: Secondary | ICD-10-CM

## 2022-05-28 DIAGNOSIS — G8929 Other chronic pain: Secondary | ICD-10-CM | POA: Diagnosis present

## 2022-05-28 DIAGNOSIS — Z87440 Personal history of urinary (tract) infections: Secondary | ICD-10-CM

## 2022-05-28 DIAGNOSIS — N179 Acute kidney failure, unspecified: Secondary | ICD-10-CM | POA: Diagnosis present

## 2022-05-28 DIAGNOSIS — Z8249 Family history of ischemic heart disease and other diseases of the circulatory system: Secondary | ICD-10-CM | POA: Diagnosis not present

## 2022-05-28 DIAGNOSIS — N390 Urinary tract infection, site not specified: Secondary | ICD-10-CM | POA: Diagnosis present

## 2022-05-28 DIAGNOSIS — N183 Chronic kidney disease, stage 3 unspecified: Secondary | ICD-10-CM | POA: Diagnosis present

## 2022-05-28 DIAGNOSIS — R109 Unspecified abdominal pain: Secondary | ICD-10-CM | POA: Diagnosis not present

## 2022-05-28 DIAGNOSIS — Z1152 Encounter for screening for COVID-19: Secondary | ICD-10-CM | POA: Diagnosis not present

## 2022-05-28 DIAGNOSIS — I130 Hypertensive heart and chronic kidney disease with heart failure and stage 1 through stage 4 chronic kidney disease, or unspecified chronic kidney disease: Secondary | ICD-10-CM | POA: Diagnosis present

## 2022-05-28 DIAGNOSIS — Z885 Allergy status to narcotic agent status: Secondary | ICD-10-CM

## 2022-05-28 DIAGNOSIS — E1122 Type 2 diabetes mellitus with diabetic chronic kidney disease: Secondary | ICD-10-CM | POA: Diagnosis present

## 2022-05-28 DIAGNOSIS — K219 Gastro-esophageal reflux disease without esophagitis: Secondary | ICD-10-CM | POA: Diagnosis present

## 2022-05-28 DIAGNOSIS — Z7401 Bed confinement status: Secondary | ICD-10-CM | POA: Diagnosis not present

## 2022-05-28 DIAGNOSIS — F419 Anxiety disorder, unspecified: Secondary | ICD-10-CM | POA: Diagnosis present

## 2022-05-28 DIAGNOSIS — E869 Volume depletion, unspecified: Secondary | ICD-10-CM | POA: Diagnosis present

## 2022-05-28 DIAGNOSIS — I10 Essential (primary) hypertension: Secondary | ICD-10-CM | POA: Diagnosis not present

## 2022-05-28 DIAGNOSIS — R531 Weakness: Secondary | ICD-10-CM | POA: Diagnosis not present

## 2022-05-28 DIAGNOSIS — I272 Pulmonary hypertension, unspecified: Secondary | ICD-10-CM | POA: Diagnosis present

## 2022-05-28 DIAGNOSIS — Z8673 Personal history of transient ischemic attack (TIA), and cerebral infarction without residual deficits: Secondary | ICD-10-CM

## 2022-05-28 DIAGNOSIS — J9811 Atelectasis: Secondary | ICD-10-CM | POA: Diagnosis not present

## 2022-05-28 DIAGNOSIS — I700 Atherosclerosis of aorta: Secondary | ICD-10-CM | POA: Diagnosis not present

## 2022-05-28 DIAGNOSIS — I2510 Atherosclerotic heart disease of native coronary artery without angina pectoris: Secondary | ICD-10-CM | POA: Diagnosis present

## 2022-05-28 DIAGNOSIS — Z743 Need for continuous supervision: Secondary | ICD-10-CM | POA: Diagnosis not present

## 2022-05-28 DIAGNOSIS — Z888 Allergy status to other drugs, medicaments and biological substances status: Secondary | ICD-10-CM | POA: Diagnosis not present

## 2022-05-28 DIAGNOSIS — M25561 Pain in right knee: Secondary | ICD-10-CM | POA: Diagnosis present

## 2022-05-28 DIAGNOSIS — Z79899 Other long term (current) drug therapy: Secondary | ICD-10-CM

## 2022-05-28 DIAGNOSIS — E782 Mixed hyperlipidemia: Secondary | ICD-10-CM | POA: Diagnosis present

## 2022-05-28 DIAGNOSIS — A4151 Sepsis due to Escherichia coli [E. coli]: Secondary | ICD-10-CM | POA: Diagnosis not present

## 2022-05-28 DIAGNOSIS — G2581 Restless legs syndrome: Secondary | ICD-10-CM | POA: Diagnosis present

## 2022-05-28 DIAGNOSIS — Z7984 Long term (current) use of oral hypoglycemic drugs: Secondary | ICD-10-CM

## 2022-05-28 DIAGNOSIS — Z809 Family history of malignant neoplasm, unspecified: Secondary | ICD-10-CM

## 2022-05-28 DIAGNOSIS — E872 Acidosis, unspecified: Secondary | ICD-10-CM | POA: Diagnosis present

## 2022-05-28 DIAGNOSIS — Z7982 Long term (current) use of aspirin: Secondary | ICD-10-CM

## 2022-05-28 DIAGNOSIS — R6889 Other general symptoms and signs: Secondary | ICD-10-CM | POA: Diagnosis not present

## 2022-05-28 DIAGNOSIS — F32A Depression, unspecified: Secondary | ICD-10-CM | POA: Diagnosis present

## 2022-05-28 DIAGNOSIS — Z83438 Family history of other disorder of lipoprotein metabolism and other lipidemia: Secondary | ICD-10-CM

## 2022-05-28 DIAGNOSIS — A419 Sepsis, unspecified organism: Secondary | ICD-10-CM | POA: Diagnosis present

## 2022-05-28 DIAGNOSIS — Z7902 Long term (current) use of antithrombotics/antiplatelets: Secondary | ICD-10-CM

## 2022-05-28 DIAGNOSIS — R6520 Severe sepsis without septic shock: Secondary | ICD-10-CM | POA: Diagnosis present

## 2022-05-28 DIAGNOSIS — I5032 Chronic diastolic (congestive) heart failure: Secondary | ICD-10-CM | POA: Diagnosis present

## 2022-05-28 DIAGNOSIS — Z8619 Personal history of other infectious and parasitic diseases: Secondary | ICD-10-CM | POA: Diagnosis present

## 2022-05-28 DIAGNOSIS — Z811 Family history of alcohol abuse and dependence: Secondary | ICD-10-CM

## 2022-05-28 LAB — COMPREHENSIVE METABOLIC PANEL
ALT: 10 U/L (ref 0–44)
AST: 11 U/L — ABNORMAL LOW (ref 15–41)
Albumin: 2 g/dL — ABNORMAL LOW (ref 3.5–5.0)
Alkaline Phosphatase: 51 U/L (ref 38–126)
Anion gap: 12 (ref 5–15)
BUN: 21 mg/dL (ref 8–23)
CO2: 18 mmol/L — ABNORMAL LOW (ref 22–32)
Calcium: 7.8 mg/dL — ABNORMAL LOW (ref 8.9–10.3)
Chloride: 104 mmol/L (ref 98–111)
Creatinine, Ser: 1.03 mg/dL — ABNORMAL HIGH (ref 0.44–1.00)
GFR, Estimated: 55 mL/min — ABNORMAL LOW (ref >60)
Glucose, Bld: 144 mg/dL — ABNORMAL HIGH (ref 70–99)
Potassium: 3.8 mmol/L (ref 3.5–5.1)
Sodium: 134 mmol/L — ABNORMAL LOW (ref 135–145)
Total Bilirubin: 1 mg/dL (ref 0.3–1.2)
Total Protein: 4.4 g/dL — ABNORMAL LOW (ref 6.5–8.1)

## 2022-05-28 LAB — PROTIME-INR
INR: 1.3 — ABNORMAL HIGH (ref 0.8–1.2)
Prothrombin Time: 16.2 seconds — ABNORMAL HIGH (ref 11.4–15.2)

## 2022-05-28 LAB — STREP PNEUMONIAE URINARY ANTIGEN: Strep Pneumo Urinary Antigen: NEGATIVE

## 2022-05-28 LAB — RESP PANEL BY RT-PCR (RSV, FLU A&B, COVID)  RVPGX2
Influenza A by PCR: NEGATIVE
Influenza B by PCR: NEGATIVE
Resp Syncytial Virus by PCR: NEGATIVE
SARS Coronavirus 2 by RT PCR: NEGATIVE

## 2022-05-28 LAB — RESPIRATORY PANEL BY PCR

## 2022-05-28 LAB — CBC WITH DIFFERENTIAL/PLATELET
Abs Immature Granulocytes: 0.04 10*3/uL (ref 0.00–0.07)
Basophils Absolute: 0.1 10*3/uL (ref 0.0–0.1)
Basophils Relative: 1 %
Eosinophils Absolute: 0.1 10*3/uL (ref 0.0–0.5)
Eosinophils Relative: 1 %
HCT: 29.9 % — ABNORMAL LOW (ref 36.0–46.0)
Hemoglobin: 9.4 g/dL — ABNORMAL LOW (ref 12.0–15.0)
Immature Granulocytes: 1 %
Lymphocytes Relative: 7 %
Lymphs Abs: 0.6 10*3/uL — ABNORMAL LOW (ref 0.7–4.0)
MCH: 33.7 pg (ref 26.0–34.0)
MCHC: 31.4 g/dL (ref 30.0–36.0)
MCV: 107.2 fL — ABNORMAL HIGH (ref 80.0–100.0)
Monocytes Absolute: 0.7 10*3/uL (ref 0.1–1.0)
Monocytes Relative: 9 %
Neutro Abs: 6.8 10*3/uL (ref 1.7–7.7)
Neutrophils Relative %: 82 %
Platelets: 121 10*3/uL — ABNORMAL LOW (ref 150–400)
RBC: 2.79 MIL/uL — ABNORMAL LOW (ref 3.87–5.11)
RDW: 13.4 % (ref 11.5–15.5)
WBC: 8.3 10*3/uL (ref 4.0–10.5)
nRBC: 0 % (ref 0.0–0.2)
nRBC: 0 /100 WBC

## 2022-05-28 LAB — GROUP A STREP BY PCR: Group A Strep by PCR: NOT DETECTED

## 2022-05-28 LAB — BLOOD CULTURE ID PANEL (REFLEXED) - BCID2

## 2022-05-28 LAB — URINALYSIS, W/ REFLEX TO CULTURE (INFECTION SUSPECTED)
Bilirubin Urine: NEGATIVE
Glucose, UA: >=500 mg/dL — AB
Ketones, ur: NEGATIVE mg/dL
Nitrite: POSITIVE — AB
Protein, ur: 100 mg/dL — AB
Specific Gravity, Urine: 1.027 (ref 1.005–1.030)
WBC, UA: >50 WBC/hpf (ref 0–5)
pH: 5 (ref 5.0–8.0)

## 2022-05-28 LAB — CBC
HCT: 32.3 % — ABNORMAL LOW (ref 36.0–46.0)
Hemoglobin: 10.1 g/dL — ABNORMAL LOW (ref 12.0–15.0)
MCH: 33.4 pg (ref 26.0–34.0)
MCHC: 31.3 g/dL (ref 30.0–36.0)
MCV: 107 fL — ABNORMAL HIGH (ref 80.0–100.0)
Platelets: 126 10*3/uL — ABNORMAL LOW (ref 150–400)
RBC: 3.02 MIL/uL — ABNORMAL LOW (ref 3.87–5.11)
RDW: 13.5 % (ref 11.5–15.5)
WBC: 8.6 10*3/uL (ref 4.0–10.5)
nRBC: 0 % (ref 0.0–0.2)

## 2022-05-28 LAB — GROUP B STREP BY PCR: Group B strep by PCR: NEGATIVE

## 2022-05-28 LAB — CULTURE, BLOOD (ROUTINE X 2)

## 2022-05-28 LAB — HEMOGLOBIN A1C
Hgb A1c MFr Bld: 8.1 % — ABNORMAL HIGH (ref 4.8–5.6)
Mean Plasma Glucose: 185.77 mg/dL

## 2022-05-28 LAB — GLUCOSE, CAPILLARY
Glucose-Capillary: 112 mg/dL — ABNORMAL HIGH (ref 70–99)
Glucose-Capillary: 134 mg/dL — ABNORMAL HIGH (ref 70–99)

## 2022-05-28 LAB — CREATININE, SERUM
Creatinine, Ser: 1.37 mg/dL — ABNORMAL HIGH (ref 0.44–1.00)
GFR, Estimated: 39 mL/min — ABNORMAL LOW (ref >60)

## 2022-05-28 LAB — LACTIC ACID, PLASMA
Lactic Acid, Venous: 7.1 mmol/L (ref 0.5–1.9)
Lactic Acid, Venous: 0.8 mmol/L (ref 0.5–1.9)

## 2022-05-28 LAB — CBG MONITORING, ED
Glucose-Capillary: 108 mg/dL — ABNORMAL HIGH (ref 70–99)
Glucose-Capillary: 128 mg/dL — ABNORMAL HIGH (ref 70–99)

## 2022-05-28 LAB — APTT: aPTT: 33 seconds (ref 24–36)

## 2022-05-28 LAB — PROCALCITONIN: Procalcitonin: 20.83 ng/mL

## 2022-05-28 LAB — C-REACTIVE PROTEIN: CRP: 23.2 mg/dL — ABNORMAL HIGH (ref <1.0)

## 2022-05-28 MED ORDER — ONDANSETRON HCL 4 MG/2ML IJ SOLN: 4.0000 mg | Freq: Four times a day (QID) | INTRAMUSCULAR | Status: DC | PRN

## 2022-05-28 MED ORDER — LIDOCAINE 5 % EX PTCH
1.0000 | MEDICATED_PATCH | CUTANEOUS | Status: DC
  Administered 2022-05-28 – 2022-05-30 (×3): 1 via TRANSDERMAL
  Filled 2022-05-28 (×3): qty 1

## 2022-05-28 MED ORDER — LACTATED RINGERS IV BOLUS (SEPSIS)
500.0000 mL | Freq: Once | INTRAVENOUS | Status: AC
  Administered 2022-05-28: 500 mL via INTRAVENOUS

## 2022-05-28 MED ORDER — BUSPIRONE HCL 5 MG PO TABS
7.5000 mg | ORAL_TABLET | Freq: Two times a day (BID) | ORAL | Status: DC
  Administered 2022-05-28 – 2022-05-31 (×7): 7.5 mg via ORAL
  Filled 2022-05-28 (×2): qty 2
  Filled 2022-05-28: qty 1
  Filled 2022-05-28: qty 2
  Filled 2022-05-28: qty 1
  Filled 2022-05-28 (×3): qty 2

## 2022-05-28 MED ORDER — LACTATED RINGERS IV BOLUS (SEPSIS)
1000.0000 mL | Freq: Once | INTRAVENOUS | Status: AC
  Administered 2022-05-28: 1000 mL via INTRAVENOUS

## 2022-05-28 MED ORDER — CITALOPRAM HYDROBROMIDE 20 MG PO TABS
20.0000 mg | ORAL_TABLET | Freq: Every day | ORAL | Status: DC
  Administered 2022-05-28 – 2022-05-31 (×4): 20 mg via ORAL
  Filled 2022-05-28: qty 1
  Filled 2022-05-28: qty 2
  Filled 2022-05-28 (×2): qty 1

## 2022-05-28 MED ORDER — IPRATROPIUM-ALBUTEROL 0.5-2.5 (3) MG/3ML IN SOLN: 3.0000 mL | Freq: Four times a day (QID) | RESPIRATORY_TRACT | Status: DC | PRN

## 2022-05-28 MED ORDER — SODIUM CHLORIDE 0.9 % IV SOLN: INTRAVENOUS | Status: DC

## 2022-05-28 MED ORDER — INSULIN ASPART 100 UNIT/ML IJ SOLN
0.0000 [IU] | Freq: Three times a day (TID) | INTRAMUSCULAR | Status: DC
  Administered 2022-05-28 – 2022-05-29 (×3): 1 [IU] via SUBCUTANEOUS
  Administered 2022-05-30: 2 [IU] via SUBCUTANEOUS
  Administered 2022-05-30: 1 [IU] via SUBCUTANEOUS
  Administered 2022-05-30 – 2022-05-31 (×3): 2 [IU] via SUBCUTANEOUS

## 2022-05-28 MED ORDER — TICAGRELOR 90 MG PO TABS
90.0000 mg | ORAL_TABLET | Freq: Two times a day (BID) | ORAL | Status: DC
  Administered 2022-05-28 – 2022-05-31 (×7): 90 mg via ORAL
  Filled 2022-05-28 (×7): qty 1

## 2022-05-28 MED ORDER — ASPIRIN 81 MG PO TBEC
81.0000 mg | DELAYED_RELEASE_TABLET | Freq: Every evening | ORAL | Status: DC
  Administered 2022-05-28 – 2022-05-30 (×3): 81 mg via ORAL
  Filled 2022-05-28 (×3): qty 1

## 2022-05-28 MED ORDER — ROSUVASTATIN CALCIUM 20 MG PO TABS
20.0000 mg | ORAL_TABLET | Freq: Every day | ORAL | Status: DC
  Administered 2022-05-28 – 2022-05-31 (×4): 20 mg via ORAL
  Filled 2022-05-28 (×4): qty 1

## 2022-05-28 MED ORDER — CARVEDILOL 12.5 MG PO TABS
12.5000 mg | ORAL_TABLET | Freq: Two times a day (BID) | ORAL | Status: DC
  Administered 2022-05-28 – 2022-05-31 (×7): 12.5 mg via ORAL
  Filled 2022-05-28 (×7): qty 1

## 2022-05-28 MED ORDER — IOHEXOL 350 MG/ML SOLN
75.0000 mL | Freq: Once | INTRAVENOUS | Status: AC | PRN
  Administered 2022-05-28: 75 mL via INTRAVENOUS

## 2022-05-28 MED ORDER — HYDROCODONE-ACETAMINOPHEN 5-325 MG PO TABS
1.0000 | ORAL_TABLET | Freq: Every day | ORAL | Status: DC | PRN
  Administered 2022-05-28: 1 via ORAL
  Filled 2022-05-28: qty 1

## 2022-05-28 MED ORDER — SODIUM CHLORIDE 0.9 % IV SOLN
2.0000 g | INTRAVENOUS | Status: DC
  Administered 2022-05-28 – 2022-05-31 (×5): 2 g via INTRAVENOUS
  Filled 2022-05-28 (×5): qty 20

## 2022-05-28 MED ORDER — MAGNESIUM OXIDE -MG SUPPLEMENT 400 (240 MG) MG PO TABS
200.0000 mg | ORAL_TABLET | Freq: Every day | ORAL | Status: DC
  Administered 2022-05-28 – 2022-05-31 (×4): 200 mg via ORAL
  Filled 2022-05-28 (×4): qty 1

## 2022-05-28 MED ORDER — SODIUM CHLORIDE 0.9% FLUSH
3.0000 mL | Freq: Two times a day (BID) | INTRAVENOUS | Status: DC
  Administered 2022-05-28 – 2022-05-31 (×6): 3 mL via INTRAVENOUS

## 2022-05-28 MED ORDER — ACETAMINOPHEN 325 MG PO TABS
650.0000 mg | ORAL_TABLET | Freq: Four times a day (QID) | ORAL | Status: DC | PRN
  Administered 2022-05-28 – 2022-05-31 (×4): 650 mg via ORAL
  Filled 2022-05-28 (×4): qty 2

## 2022-05-28 MED ORDER — ENOXAPARIN SODIUM 40 MG/0.4ML IJ SOSY
40.0000 mg | PREFILLED_SYRINGE | INTRAMUSCULAR | Status: DC
  Administered 2022-05-28 – 2022-05-31 (×4): 40 mg via SUBCUTANEOUS
  Filled 2022-05-28 (×4): qty 0.4

## 2022-05-28 MED ORDER — INSULIN ASPART 100 UNIT/ML IJ SOLN
0.0000 [IU] | Freq: Every day | INTRAMUSCULAR | Status: DC
  Administered 2022-05-29: 3 [IU] via SUBCUTANEOUS

## 2022-05-28 MED ORDER — ONDANSETRON HCL 4 MG PO TABS: 4.0000 mg | ORAL_TABLET | Freq: Four times a day (QID) | ORAL | Status: DC | PRN

## 2022-05-28 MED ORDER — AMLODIPINE BESYLATE 10 MG PO TABS
10.0000 mg | ORAL_TABLET | Freq: Every day | ORAL | Status: DC
  Administered 2022-05-28 – 2022-05-31 (×4): 10 mg via ORAL
  Filled 2022-05-28 (×3): qty 1
  Filled 2022-05-28: qty 2

## 2022-05-28 MED ORDER — GABAPENTIN 100 MG PO CAPS
100.0000 mg | ORAL_CAPSULE | Freq: Three times a day (TID) | ORAL | Status: DC
  Administered 2022-05-28 – 2022-05-31 (×11): 100 mg via ORAL
  Filled 2022-05-28 (×11): qty 1

## 2022-05-28 MED ORDER — HYDROCODONE-ACETAMINOPHEN 10-325 MG PO TABS
1.0000 | ORAL_TABLET | Freq: Four times a day (QID) | ORAL | Status: DC | PRN
  Administered 2022-05-28 – 2022-05-31 (×5): 1 via ORAL
  Filled 2022-05-28 (×6): qty 1

## 2022-05-28 MED ORDER — METHOCARBAMOL 500 MG PO TABS
500.0000 mg | ORAL_TABLET | Freq: Three times a day (TID) | ORAL | Status: DC | PRN
  Administered 2022-05-28: 500 mg via ORAL
  Filled 2022-05-28: qty 1

## 2022-05-28 MED ORDER — ACETAMINOPHEN 650 MG RE SUPP: 650.0000 mg | Freq: Four times a day (QID) | RECTAL | Status: DC | PRN

## 2022-05-28 MED ORDER — SACCHAROMYCES BOULARDII 250 MG PO CAPS
250.0000 mg | ORAL_CAPSULE | Freq: Every day | ORAL | Status: DC
  Administered 2022-05-29 – 2022-05-31 (×3): 250 mg via ORAL
  Filled 2022-05-28 (×3): qty 1

## 2022-05-28 MED ORDER — OXYBUTYNIN CHLORIDE ER 10 MG PO TB24
10.0000 mg | ORAL_TABLET | Freq: Every day | ORAL | Status: DC
  Administered 2022-05-28 – 2022-05-30 (×3): 10 mg via ORAL
  Filled 2022-05-28 (×4): qty 1

## 2022-05-28 MED ORDER — SODIUM CHLORIDE 0.9 % IV SOLN
500.0000 mg | Freq: Once | INTRAVENOUS | Status: AC
  Administered 2022-05-28: 500 mg via INTRAVENOUS
  Filled 2022-05-28: qty 5

## 2022-05-28 NOTE — Progress Notes (Signed)
[facility] night cross cover note:   I was notified by RN of the patient's request for lidocaine patch in the setting of her chronic right knee pain I subsequently placed order for lidocaine patch to the right knee.     Newton Pigg, DO Hospitalist

## 2022-05-28 NOTE — ED Triage Notes (Signed)
Patient bib [facility] from [facility]. Staff reports she has been lethargic and removed her oxygen which she is dependent on. On arrival EMS states her initial oxygen saturation was 76% after being placed on the nasal cannula her saturations returned to the 90s. EMS reports clear breath sounds but feels there is a possible UTI due to the odor of her urine. Patient alert and oriented on arrival to ED.

## 2022-05-28 NOTE — ED Notes (Signed)
Pt refused hydrocodone that was pulled out of pyxsis and opened. Wasted in med room and witnessed with Teacher, early years/pre in med room.

## 2022-05-28 NOTE — Sepsis Progress Note (Signed)
Following for sepsis monitoring ?

## 2022-05-28 NOTE — ED Provider Notes (Signed)
[facility]EMER[facility]ENCY DEPT [facility] Emergency Department Provider Note MRN:  [id number]  Arrival date & time: 05/28/22     Chief Complaint   Fatigue   History of Present Illness   Jadis Horrell Kain is a 82 y.o. year-old female with a history of CKD 3, diabetes presenting to the ED with chief complaint of fatigue.  Fatigue and lethargy [place].  With suspicion for UTI.  Reportedly hypoxic with EMS.  Foul-smelling urine.  Patient is a bit somnolent but wakes, endorses abdominal pain for the past few days.  Review of Systems  A thorough review of systems was obtained and all systems are negative except as noted in the HPI and PMH.   Patient's Health History    Past Medical History:  Diagnosis Date   Acute kidney injury (HCC) 08/05/2016   Aftercare following surgery of the circulatory system, NEC 05/11/2013   AKI (acute kidney injury) (HCC) 08/04/2016   Anxiety    takes Celexa daily   ARF (acute renal failure) (HCC) 11/07/2016   Asymptomatic stenosis of right carotid artery 03/22/2016   Back pain    occasionally   Carotid artery disease (HCC) 04/16/2013   Carotid artery occlusion    Carotid stenosis 11/16/2013   Cataract    left and immature   Coronary artery disease    Coronary atherosclerosis of native coronary artery 03/11/2013   S/p CABG in 1997    Depression    Diabetes mellitus    takes Metformin and Glipizide daily   Diabetes mellitus (HCC) 05/02/2015   Dizziness    takes Meclizine daily as needed   Essential hypertension, benign 03/11/2013   GERD (gastroesophageal reflux disease)    takes Omeprazole daily as needed   Headache(784.0)    Hyperlipidemia    takes Atorvastatin daily   Hypertension    takes Carvedilol daily   Mixed hyperlipidemia 03/11/2013   Muscle spasm    takes Robaxin daily as needed   Nausea    takes Phenergan daily as needed   Occlusion and stenosis of carotid artery without mention of cerebral infarction 07/19/2011   Pneumonia    hx of-in high  school   Restless leg    takes Requip daily as needed   Seasonal allergies    takes Claritin daily as needed and Afrin as needed   Shortness of breath    with exertion   Urinary urgency    UTI (urinary tract infection) 11/07/2016    Past Surgical History:  Procedure Laterality Date   COLONOSCOPY WITH PROPOFOL N/A 03/10/2012   Procedure: COLONOSCOPY WITH PROPOFOL;  Surgeon: Charolett Bumpers, MD;  Location: [facility] ENDOSCOPY;  Service: Endoscopy;  Laterality: N/A;   CORNEAL TRANSPLANT Right    CORONARY ARTERY BYPASS GRAFT  1997   x 6   CORONARY ARTERY BYPASS GRAFT  Jan. 1997   ENDARTERECTOMY Left 04/16/2013   Procedure: Left Carotid Artery Endatarectomy with Resection of Redundant Internal Carotid Artery;  Surgeon: Larina Earthly, MD;  Location: [facility] OR;  Service: Vascular;  Laterality: Left;   ENDARTERECTOMY Right 03/22/2016   Procedure: RIGHT ENDARTERECTOMY CAROTID;  Surgeon: Larina Earthly, MD;  Location: [facility] OR;  Service: Vascular;  Laterality: Right;   ESOPHAGOGASTRODUODENOSCOPY N/A 03/10/2012   Procedure: ESOPHAGOGASTRODUODENOSCOPY (EGD);  Surgeon: Charolett Bumpers, MD;  Location: [facility];  Service: Endoscopy;  Laterality: N/A;   EYE SURGERY  March 12, 2001   CORNEA TRANSPLANT Right eye   PATCH ANGIOPLASTY Right 03/22/2016   Procedure: PATCH ANGIOPLASTY;  Surgeon:  Larina Earthly, MD;  Location: [facility] OR;  Service: Vascular;  Laterality: Right;   PR VEIN BYPASS GRAFT,AORTO-FEM-POP  1997   SPINE SURGERY  march 2013   Back surgery   TONSILLECTOMY     TRIGGER FINGER RELEASE Left    thumb    Family History  Problem Relation Age of Onset   Heart attack Father    Heart disease Father 66       Before age of 66   Hyperlipidemia Father    Heart disease Brother        Heart dissease before age 23   Hyperlipidemia Brother    Cirrhosis Mother    Heart attack Paternal Grandfather    Heart disease Paternal Grandfather    Cancer Maternal Grandmother    Alcoholism Maternal Grandfather    Heart attack  Paternal Grandmother     Social History   Socioeconomic History   Marital status: Widowed    Spouse name: Not on file   Number of children: Not on file   Years of education: Not on file   Highest education level: Not on file  Occupational History   Not on file  Tobacco Use   Smoking status: Never   Smokeless tobacco: Never  Vaping Use   Vaping Use: Never used  Substance and Sexual Activity   Alcohol use: No    Alcohol/week: 0.0 standard drinks of alcohol   Drug use: No   Sexual activity: Never    Birth control/protection: Post-menopausal  Other Topics Concern   Not on file  Social History Narrative   Not on file   Social Determinants of Health   Financial Resource Strain: Not on file  Food Insecurity: Not on file  Transportation Needs: Not on file  Physical Activity: Not on file  Stress: Not on file  Social Connections: Not on file  Intimate Partner Violence: Not on file     Physical Exam   Vitals:   05/28/22 0445 05/28/22 0500  BP: (!) 96/47 (!) 97/46  Pulse: 69 67  Resp: 16 18  Temp:    SpO2: 100% 100%    CONSTITUTIONAL: Chronically ill-appearing, NAD NEURO/PSYCH: Somnolent but easily wakes, alert and oriented to name, moves all extremities EYES:  eyes equal and reactive ENT/NECK:  no LAD, no JVD CARDIO: Regular rate, well-perfused, normal S1 and S2 PULM:  CTAB no wheezing or rhonchi GI/GU:  non-distended, non-tender MSK/SPINE:  No gross deformities, no edema SKIN:  no rash, atraumatic, appears pale   *Additional and/or pertinent findings included in MDM below  Diagnostic and Interventional Summary    EKG Interpretation  Date/Time:  Tuesday May 28 2022 00:59:01 EDT Ventricular Rate:  76 PR Interval:  201 QRS Duration: 114 QT Interval:  417 QTC Calculation: 469 R Axis:   71 Text Interpretation: Sinus rhythm Borderline intraventricular conduction delay Confirmed by Kennis Carina [phone]) on 05/28/2022 3:18:42 AM       Labs Reviewed  LACTIC  ACID, PLASMA - Abnormal; Notable for the following components:      Result Value   Lactic Acid, Venous 7.1 (*)    All other components within normal limits  COMPREHENSIVE METABOLIC PANEL - Abnormal; Notable for the following components:   Sodium 134 (*)    CO2 18 (*)    Glucose, Bld 144 (*)    Creatinine, Ser 1.03 (*)    Calcium 7.8 (*)    Total Protein 4.4 (*)    Albumin 2.0 (*)    AST 11 (*)  GFR, Estimated 55 (*)    All other components within normal limits  CBC WITH DIFFERENTIAL/PLATELET - Abnormal; Notable for the following components:   RBC 2.79 (*)    Hemoglobin 9.4 (*)    HCT 29.9 (*)    MCV 107.2 (*)    Platelets 121 (*)    Lymphs Abs 0.6 (*)    All other components within normal limits  PROTIME-INR - Abnormal; Notable for the following components:   Prothrombin Time 16.2 (*)    INR 1.3 (*)    All other components within normal limits  RESP PANEL BY RT-PCR (RSV, FLU A&B, COVID)  RVPGX2  CULTURE, BLOOD (ROUTINE X 2)  CULTURE, BLOOD (ROUTINE X 2)  LACTIC ACID, PLASMA  APTT  URINALYSIS, W/ REFLEX TO CULTURE (INFECTION SUSPECTED)    CT ABDOMEN PELVIS W CONTRAST  Final Result    DG Chest Port 1 View  Final Result      Medications  cefTRIAXone (ROCEPHIN) 2 g in sodium chloride 0.9 % 100 mL IVPB (0 g Intravenous Stopped 05/28/22 0211)  lactated ringers bolus 1,000 mL (1,000 mLs Intravenous New Bag/Given 05/28/22 0135)    And  lactated ringers bolus 1,000 mL (0 mLs Intravenous Stopped 05/28/22 0451)    And  lactated ringers bolus 500 mL (500 mLs Intravenous New Bag/Given 05/28/22 0451)  iohexol (OMNIPAQUE) 350 MG/ML injection 75 mL (75 mLs Intravenous Contrast Given 05/28/22 0231)     Procedures  /  Critical Care .Critical Care  Performed by: Sabas Sous, MD Authorized by: Sabas Sous, MD   Critical care provider statement:    Critical care time (minutes):  35   Critical care was necessary to treat or prevent imminent or life-threatening deterioration  of the following conditions:  Sepsis   Critical care was time spent personally by me on the following activities:  Development of treatment plan with patient or surrogate, discussions with consultants, evaluation of patient's response to treatment, examination of patient, ordering and review of laboratory studies, ordering and review of radiographic studies, ordering and performing treatments and interventions, pulse oximetry, re-evaluation of patient's condition and review of old charts   ED Course and Medical Decision Making  Initial Impression and Ddx Concern for sepsis.  Patient is hypotensive, oral temp 99.2, symptoms that could suggest an infectious source.  Suspicious for either pneumonia or UTI or possibly other intra-abdominal source given the recent abdominal pain.  Past medical/surgical history that increases complexity of ED encounter: CKD  Interpretation of Diagnostics I personally reviewed the laboratory assessment and my interpretation is as follows: Prominent elevated lactate, no leukocytosis  CT revealing evidence of UTI but no other emergent process within the abdomen.  Patient Reassessment and Ultimate Disposition/Management     Plan is for hospitalist admission.  Lactate is cleared.  Patient management required discussion with the following services or consulting groups:  Hospitalist Service  Complexity of Problems Addressed Acute illness or injury that poses threat of life of bodily function  Additional Data Reviewed and Analyzed Further history obtained from: Prior labs/imaging results  Additional Factors Impacting ED Encounter Risk Consideration of hospitalization  Elmer Sow. Pilar Plate, MD [facility] Emergency Medicine [facility] [email or web address]  Final Clinical Impressions(s) / ED Diagnoses     ICD-10-CM   1. Sepsis, due to unspecified organism, unspecified whether acute organ dysfunction present [facility])  A41.9       ED Discharge Orders      None  Discharge Instructions Discussed with and Provided to Patient:   Discharge Instructions   None      Sabas Sous, MD 05/28/22 [phone]

## 2022-05-28 NOTE — ED Notes (Signed)
Patient transported to CT 

## 2022-05-28 NOTE — ED Notes (Signed)
Pt had a bowel movement.

## 2022-05-28 NOTE — Progress Notes (Signed)
PHARMACY - PHYSICIAN COMMUNICATION CRITICAL VALUE ALERT - BLOOD CULTURE IDENTIFICATION (BCID)  Andrea Kirk is an 82 y.o. female who presented to [facility] on 05/28/2022 with a chief complaint of fatigue due to sepsis 2/2 UTI.  Assessment:  1/4 bottles (anaerobic) E. Coli, ESBL not detected  Name of physician (or Provider) Contacted: Junious Silk, NP and Dow Adolph, DO  Current antibiotics: Ceftriaxone 2g IV q24h  Changes to prescribed antibiotics recommended:  Patient is on recommended antibiotics - No changes needed  Results for orders placed or performed during the hospital encounter of 02/13/21  Blood Culture ID Panel (Reflexed) (Collected: 02/13/2021 11:00 AM)  Result Value Ref Range   Enterococcus faecalis NOT DETECTED NOT DETECTED   Enterococcus Faecium NOT DETECTED NOT DETECTED   Listeria monocytogenes NOT DETECTED NOT DETECTED   Staphylococcus species NOT DETECTED NOT DETECTED   Staphylococcus aureus (BCID) NOT DETECTED NOT DETECTED   Staphylococcus epidermidis NOT DETECTED NOT DETECTED   Staphylococcus lugdunensis NOT DETECTED NOT DETECTED   Streptococcus species NOT DETECTED NOT DETECTED   Streptococcus agalactiae NOT DETECTED NOT DETECTED   Streptococcus pneumoniae NOT DETECTED NOT DETECTED   Streptococcus pyogenes NOT DETECTED NOT DETECTED   A.calcoaceticus-baumannii NOT DETECTED NOT DETECTED   Bacteroides fragilis NOT DETECTED NOT DETECTED   Enterobacterales DETECTED (A) NOT DETECTED   Enterobacter cloacae complex NOT DETECTED NOT DETECTED   Escherichia coli DETECTED (A) NOT DETECTED   Klebsiella aerogenes NOT DETECTED NOT DETECTED   Klebsiella oxytoca NOT DETECTED NOT DETECTED   Klebsiella pneumoniae NOT DETECTED NOT DETECTED   Proteus species NOT DETECTED NOT DETECTED   Salmonella species NOT DETECTED NOT DETECTED   Serratia marcescens NOT DETECTED NOT DETECTED   Haemophilus influenzae NOT DETECTED NOT DETECTED   Neisseria meningitidis NOT DETECTED NOT  DETECTED   Pseudomonas aeruginosa NOT DETECTED NOT DETECTED   Stenotrophomonas maltophilia NOT DETECTED NOT DETECTED   Candida albicans NOT DETECTED NOT DETECTED   Candida auris NOT DETECTED NOT DETECTED   Candida glabrata NOT DETECTED NOT DETECTED   Candida krusei NOT DETECTED NOT DETECTED   Candida parapsilosis NOT DETECTED NOT DETECTED   Candida tropicalis NOT DETECTED NOT DETECTED   Cryptococcus neoformans/gattii NOT DETECTED NOT DETECTED   CTX-M ESBL NOT DETECTED NOT DETECTED   Carbapenem resistance IMP NOT DETECTED NOT DETECTED   Carbapenem resistance KPC NOT DETECTED NOT DETECTED   Carbapenem resistance NDM NOT DETECTED NOT DETECTED   Carbapenem resist OXA 48 LIKE NOT DETECTED NOT DETECTED   Carbapenem resistance VIM NOT DETECTED NOT DETECTED    Wilburn Cornelia, PharmD, BCPS Clinical Pharmacist 05/28/2022 4:29 PM   Please refer to AMION for pharmacy phone number

## 2022-05-28 NOTE — ED Notes (Signed)
Suction set up at bedside and within reach of pt to use when she coughs up a lot of mucus. Tissues and emesis bag w/in reach as well as call light.

## 2022-05-28 NOTE — ED Notes (Signed)
Called lab to have resp panel and strep pneumoniae added to previous sample. Per lab they will add labs on.

## 2022-05-28 NOTE — ED Notes (Signed)
Checked blood sugar it was 108 notified RN of blood sugar patient is resting with call bell in reach

## 2022-05-28 NOTE — ED Notes (Signed)
Pt verbally aggressive towards staff when this RN tried asking pt to describe what her pain feels like.

## 2022-05-28 NOTE — Progress Notes (Signed)
PT Cancellation Note  Patient Details Name: Andrea Kirk MRN: [id number] DOB: [birth year]   Cancelled Treatment:    Reason Eval/Treat Not Completed: Patient declined, no reason specified;Fatigue/lethargy limiting ability to participate. Pt declines PT evaluation until tomorrow morning due to fatigue. PT will follow up as time allows.   Arlyss Gandy 05/28/2022, 3:58 PM

## 2022-05-28 NOTE — H&P (Addendum)
History and Physical    Patient: Andrea Kirk ZOX:[id number] DOB: [birth year] DOA: 05/28/2022 DOS: the patient was seen and examined on 05/28/2022 PCP: Georgann Housekeeper, MD  Patient coming from: [facility]  Chief Complaint:  Chief Complaint  Patient presents with   Fatigue   HPI: Andrea Kirk is a 82 y.o. 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  Patient brought to the ED by [facility] with staff reporting that the patient had been lethargic and kept taking her oxygen off.  Her initial O2 sat was 76% but returned to the 90s after replacement of O2 cannula.  Patient has had malodorous urine.  By the time she arrived to the ER she was alert and oriented.  Upon initial presentation to the ER she was mildly febrile with a temperature of 99.2, borderline hypotensive with a BP of 98/54 and an MAP of 62, O2 sat 97% on 3 L of oxygen.  She had been given 1 L of IV fluid.  Her initial labs revealed a sodium of 134, creatinine 1.3 which is higher than baseline of 0.88 normal LFTs, lactic acid 7.1, white count 8300 with a hemoglobin of 9.4 and an MCV of 107, no left shift, mildly elevated PT 16.2 and INR 1.3, blood cultures were obtained, chest x-ray only revealed low lung volumes.  CT abdomen pelvis concerning for UTI as well as right basilar changes concerning for either atelectasis or pneumonia.  Hospitalist team was asked to evaluate the patient for admission.  Because of positive sepsis physiology patient was given multiple fluid challenges with significant improvement in lactic acid from 7.1 to 0.8.  Code sepsis was initiated by the EDP.  Upon my evaluation of the patient she was alert and oriented and had attempted to call her brother on the cell phone.  She was complaining of being cold  and later on was noted to have spiked a temperature of 102.4.  She was not have any pain.  She does report a new cough and states that a majority of the residents at the nursing facility where she lives have had some sort of illness including upper respiratory symptoms.  She reports she has been eating food adequately but not drinking enough p.o. fluids.  She has also been having mild suprapubic pain.   Review of Systems: As mentioned in the history of present illness. All other systems reviewed and are negative. Past Medical History:  Diagnosis Date   Acute kidney injury (HCC) 08/05/2016   Aftercare following surgery of the circulatory system, NEC 05/11/2013   AKI (acute kidney injury) (HCC) 08/04/2016   Anxiety    takes Celexa daily   ARF (acute renal failure) (HCC) 11/07/2016   Asymptomatic stenosis of right carotid artery 03/22/2016   Back pain    occasionally   Carotid artery disease (HCC) 04/16/2013   Carotid artery occlusion    Carotid stenosis 11/16/2013   Cataract    left and immature   Coronary artery disease    Coronary atherosclerosis of native coronary artery 03/11/2013   S/p CABG in 1997    Depression    Diabetes mellitus    takes Metformin and Glipizide daily   Diabetes mellitus (HCC) 05/02/2015   Dizziness    takes Meclizine daily as needed   Essential hypertension, benign 03/11/2013   GERD (gastroesophageal reflux disease)  takes Omeprazole daily as needed   Headache(784.0)    Hyperlipidemia    takes Atorvastatin daily   Hypertension    takes Carvedilol daily   Mixed hyperlipidemia 03/11/2013   Muscle spasm    takes Robaxin daily as needed   Nausea    takes Phenergan daily as needed   Occlusion and stenosis of carotid artery without mention of cerebral infarction 07/19/2011   Pneumonia    hx of-in high school   Restless leg    takes Requip daily as needed   Seasonal allergies    takes Claritin daily as needed and Afrin as needed   Shortness of breath    with  exertion   Urinary urgency    UTI (urinary tract infection) 11/07/2016   Past Surgical History:  Procedure Laterality Date   COLONOSCOPY WITH PROPOFOL N/A 03/10/2012   Procedure: COLONOSCOPY WITH PROPOFOL;  Surgeon: Charolett Bumpers, MD;  Location: [facility]COPY;  Service: Endoscopy;  Laterality: N/A;   CORNEAL TRANSPLANT Right    CORONARY ARTERY BYPASS GRAFT  1997   x 6   CORONARY ARTERY BYPASS GRAFT  Jan. 1997   ENDARTERECTOMY Left 04/16/2013   Procedure: Left Carotid Artery Endatarectomy with Resection of Redundant Internal Carotid Artery;  Surgeon: Larina Earthly, MD;  Location: [facility] OR;  Service: Vascular;  Laterality: Left;   ENDARTERECTOMY Right 03/22/2016   Procedure: RIGHT ENDARTERECTOMY CAROTID;  Surgeon: Larina Earthly, MD;  Location: [facility] OR;  Service: Vascular;  Laterality: Right;   ESOPHAGOGASTRODUODENOSCOPY N/A 03/10/2012   Procedure: ESOPHAGOGASTRODUODENOSCOPY (EGD);  Surgeon: Charolett Bumpers, MD;  Location: [facility];  Service: Endoscopy;  Laterality: N/A;   EYE SURGERY  March 12, 2001   CORNEA TRANSPLANT Right eye   PATCH ANGIOPLASTY Right 03/22/2016   Procedure: PATCH ANGIOPLASTY;  Surgeon: Larina Earthly, MD;  Location: [facility] OR;  Service: Vascular;  Laterality: Right;   PR VEIN BYPASS GRAFT,AORTO-FEM-POP  1997   SPINE SURGERY  march 2013   Back surgery   TONSILLECTOMY     TRIGGER FINGER RELEASE Left    thumb   Social History:  reports that she has never smoked. She has never used smokeless tobacco. She reports that she does not drink alcohol and does not use drugs.  Allergies  Allergen Reactions   Codeine Other (See Comments)    Abnormal behavior    Latex Rash and Other (See Comments)    tears skin    Morphine And Codeine Other (See Comments)    Affects BP and blood sugar.   Cyclobenzaprine     MADE PT SICK- pt unsure if med was cyclobenzaprine or methocarbamol    Methocarbamol Other (See Comments)    MADE PT SICK- pt unsure if med was cyclobenzaprine or methocarbamol   11/09/20 pt currently takes methocarbamol    Family History  Problem Relation Age of Onset   Heart attack Father    Heart disease Father 75       Before age of 59   Hyperlipidemia Father    Heart disease Brother        Heart dissease before age 51   Hyperlipidemia Brother    Cirrhosis Mother    Heart attack Paternal Grandfather    Heart disease Paternal Grandfather    Cancer Maternal Grandmother    Alcoholism Maternal Grandfather    Heart attack Paternal Grandmother     Prior to Admission medications   Medication Sig Start Date End Date Taking? Authorizing Provider  acetaminophen (TYLENOL)  325 MG tablet Take 650 mg by mouth every 6 (six) hours as needed for moderate pain.    [provider]  amLODipine (NORVASC) 5 MG tablet Take 1 tablet (5 mg total) by mouth daily. 02/20/21   Burnadette Pop, MD  Ascorbic Acid (VITAMIN C) 1000 MG tablet Take 1,000 mg by mouth daily.    [provider]  aspirin EC 81 MG tablet Take 81 mg by mouth every evening.     [provider]  busPIRone (BUSPAR) 7.5 MG tablet Take 7.5 mg by mouth 2 (two) times daily. 11/05/20   [provider]  carvedilol (COREG) 12.5 MG tablet Take 1 tablet (12.5 mg total) by mouth 2 (two) times daily. 06/28/20 04/14/21  Noralee Stain, DO  citalopram (CELEXA) 20 MG tablet Take 20 mg by mouth daily.    [provider]  Cyanocobalamin (B-12) 1000 MCG TABS Take 1,000 mcg by mouth daily.    [provider]  docusate sodium (COLACE) 100 MG capsule Take 100 mg by mouth 2 (two) times daily.    [provider]  ferrous sulfate 325 (65 FE) MG tablet Take 325 mg by mouth daily.    [provider]  furosemide (LASIX) 20 MG tablet Take 20 mg by mouth daily as needed for edema. 10/17/20   [provider]  gabapentin (NEURONTIN) 100 MG capsule Take 1 capsule (100 mg total) by mouth 3 (three) times daily. 02/20/21   Burnadette Pop, MD  glipiZIDE (GLUCOTROL XL) 5  MG 24 hr tablet Take 1 tablet (5 mg total) by mouth daily with breakfast. 04/21/20   Drema Dallas, MD  HYDROcodone-acetaminophen (NORCO/VICODIN) 5-325 MG tablet Take 1 tablet by mouth daily as needed (breakthrough pain). Also takes 10/325mg  twice daily scheduled    [provider]  Infant Care Products [facility]) OINT Apply 1 application. topically as directed. 03/10/21   [provider]  LAGEVRIO 200 MG CAPS capsule Take by mouth. 09/07/21   [provider]  lidocaine (LIDODERM) 5 % Place 1 patch onto the skin daily. Apply to back and left leg for 12 hours per day for pain. Remove every morning. 11/08/20   [provider]  loperamide (IMODIUM) 2 MG capsule Take 1 capsule (2 mg total) by mouth every 6 (six) hours as needed for diarrhea or loose stools. 02/20/21   Burnadette Pop, MD  Magnesium Oxide 250 MG TABS Take 250 mg by mouth daily.    [provider]  meclizine (ANTIVERT) 25 MG tablet Take 25 mg by mouth 2 (two) times daily as needed for dizziness. For dizziness    [provider]  melatonin 3 MG TABS tablet Take 3 mg by mouth at bedtime.    [provider]  Menthol, Topical Analgesic, (BIOFREEZE) 4 % GEL Apply 1 application topically 3 (three) times daily as needed (bilateral leg pain).    [provider]  methocarbamol (ROBAXIN) 500 MG tablet Take 1 tablet (500 mg total) by mouth every 8 (eight) hours as needed for muscle spasms. 02/20/21   Burnadette Pop, MD  nitrofurantoin, macrocrystal-monohydrate, (MACROBID) 100 MG capsule Take 100 mg by mouth 2 (two) times daily. For 10 days 04/29/21   [provider]  nitroGLYCERIN (NITROSTAT) 0.4 MG SL tablet Place 1 tablet (0.4 mg total) under the tongue every 5 (five) minutes as needed. Chest pain. Patient taking differently: Place 0.4 mg under the tongue every 5 (five) minutes as needed for chest pain. 01/18/19   Corky Crafts,  MD  nystatin (MYCOSTATIN/NYSTOP) powder  Apply 1 application topically 2 (two) times daily as needed (rash).    [provider]  oxybutynin (DITROPAN-XL) 5 MG 24 hr tablet Take 5 mg by mouth daily. 09/26/20   [provider]  Polyethyl Glycol-Propyl Glycol (SYSTANE) 0.4-0.3 % SOLN Apply 1-2 drops to eye 4 (four) times daily as needed (dry eye).    [provider]  rosuvastatin (CRESTOR) 20 MG tablet Take 1 tablet (20 mg total) by mouth daily. 05/08/21   Corky Crafts, MD  saccharomyces boulardii (FLORASTOR) 250 MG capsule Take 250 mg by mouth daily.    [provider]  ticagrelor (BRILINTA) 90 MG TABS tablet Take 1 tablet (90 mg total) by mouth 2 (two) times daily. 04/20/20   Drema Dallas, MD  valsartan (DIOVAN) 80 MG tablet Take 80 mg by mouth daily. 12/03/21   [provider]    Physical Exam: Vitals:   05/28/22 0600 05/28/22 0615 05/28/22 0651 05/28/22 0700  BP: (!) 112/48 (!) 119/52  (!) 123/52  Pulse: 65 64  67  Resp: (!) 29 20  (!) 25  Temp:   98.1 F (36.7 C)   TempSrc:   Oral   SpO2: 100% 100%  100%  Weight:      Height:       Constitutional: NAD, calm, comfortable Respiratory: Lung sounds on anterior exam showed inspiratory crackles especially in the right lung.  No increased work of breathing.  No coughing during my exam.  O2 in place with sats as noted in HPI. Cardiovascular: Regular rate and rhythm, no murmurs / rubs / gallops. No extremity edema. 2+ pedal pulses.  Abdomen: Mild suprapubic tenderness with palpation, no masses palpated. No hepatosplenomegaly. Bowel sounds positive.  Musculoskeletal: no clubbing / cyanosis. No joint deformity upper and lower extremities. Good ROM, no contractures. Normal muscle tone.  Skin: no rashes, lesions, ulcers. No induration Neurologic: CN 2-12 grossly intact. Sensation intact,  Strength 4/5 x all 4 extremities.  Psychiatric: Normal judgment and insight. Alert and oriented x 3. Normal mood.    Data Reviewed:  As per  HPI  Assessment and Plan: Sepsis secondary to UTI Patient initially presented with borderline hypotension and MAP less than 65 but has improved with aggressive volume hydration of lactated Ringer's x 3 L Initial lactic acid has improved from 7.1 to 0.8 after aggressive hydration Baseline creatinine 0.88 with presenting creatinine 1.03. Follow-up creatinine on the morning of 5/21 and is now up to 1.37 demonstrating multisystem involvement from sepsis physiology CRP elevated at 23.2 Continue IV Rocephin Follow-up on blood cultures Hemodynamically stable so for now we will continue gentle IV fluid hydration at 75 cc/h  Acute kidney injury Likely secondary to volume depletion and sepsis physiology Creatinine has increased since arrival to ED Currently avoiding additional volume resuscitation since hemodynamically stable-patient received 2.5 L of LR in the ER and would like to avoid volume overload Continue supplemental normal saline at 75 cc an hour Hold preadmission ARB as well as other nephrotoxic medications Follow labs.  Baseline creatinine 0.88 with creatinine now up to 1.37 with a GFR of 39  New cough/known pulmonary hypertension with moderate TR/chronic hypoxemia On 2 to 3 L O2 at baseline Initial imaging questioned possible right basilar atelectasis versus pneumonia Does have elevated CRP but also has concomitant UTI Procalcitonin pending PCR for influenza, RSV and COVID-negative Patient complaining of sore throat as well so we will check group A strep and group B strep  PCR; will also check a full respiratory panel with 20 pathogens Add as needed DuoNebs Will check procalcitonin and will initiate empiric Zithromax to go with the Rocephin she is on for the UTI.  Will also repeat chest x-ray post hydration in the event of pneumonia may become more visible.  Diabetes mellitus 2 Check hemoglobin A1c Follow CBGs and provide SSI Hold preadmission Jardiance and  glipizide  Hypertension w/ HFpEF/grade 3 diastolic dysfunction Continue Norvasc and carvedilol Hold Lasix as above in regards to AKI  Dyslipidemia Continue Crestor  Depression Continue Celexa and BuSpar  Chronic pain Continue as needed Vicodin and Neurontin If renal function continues to worsen will need to decrease Neurontin dose  **Baseline mobility: Patient utilizes cane, walker as well as wheelchair at [facility]    Advance Care Planning:   Code Status: Full Code   VTE prophylaxis: Lovenox  Consults: None  Family Communication: Patient only  Severity of Illness: The appropriate patient status for this patient is INPATIENT. Inpatient status is judged to be reasonable and necessary in order to provide the required intensity of service to ensure the patient's safety. The patient's presenting symptoms, physical exam findings, and initial radiographic and laboratory data in the context of their chronic comorbidities is felt to place them at high risk for further clinical deterioration. Furthermore, it is not anticipated that the patient will be medically stable for discharge from the hospital within 2 midnights of admission.   * I certify that at the point of admission it is my clinical judgment that the patient will require inpatient hospital care spanning beyond 2 midnights from the point of admission due to high intensity of service, high risk for further deterioration and high frequency of surveillance required.*  Author: Junious Silk, NP 05/28/2022 7:19 AM  For on call review [email or web address].

## 2022-05-28 NOTE — ED Notes (Signed)
ED TO INPATIENT HANDOFF REPORT  ED Nurse Name and Phone #: Morrie Sheldon [phone]  [facility] Name/Age/Gender Andrea Kirk 82 y.o. female Room/Bed: 5W20C/5W20C-01  Code Status   Code Status: Full Code  Home/SNF/Other Nursing Home Patient oriented to: self, place, time, and situation Is this baseline? Yes   Triage Complete: Triage complete  Chief Complaint Sepsis secondary to UTI (HCC) [A41.9, N39.0] Sepsis, due to unspecified organism, unspecified whether acute organ dysfunction present [facility]) [A41.9]  Triage Note Patient bib [facility] from [facility]. Staff reports she has been lethargic and removed her oxygen which she is dependent on. On arrival EMS states her initial oxygen saturation was 76% after being placed on the nasal cannula her saturations returned to the 90s. EMS reports clear breath sounds but feels there is a possible UTI due to the odor of her urine. Patient alert and oriented on arrival to ED.     Allergies Allergies  Allergen Reactions   Codeine Other (See Comments)    Abnormal behavior    Latex Rash and Other (See Comments)    tears skin    Morphine And Codeine Other (See Comments)    Affects BP and blood sugar.   Cyclobenzaprine     MADE PT SICK- pt unsure if med was cyclobenzaprine or methocarbamol    Methocarbamol Other (See Comments)    MADE PT SICK- pt unsure if med was cyclobenzaprine or methocarbamol  11/09/20 pt currently takes methocarbamol    Level of Care/Admitting Diagnosis ED Disposition     ED Disposition  Admit   Condition  --   Comment  Hospital Area: [facility] [[id number]]  Level of Care: Progressive [102]  Admit to Progressive based on following criteria: MULTISYSTEM THREATS such as stable sepsis, metabolic/electrolyte imbalance with or without encephalopathy that is responding to early treatment.  May admit patient to [facility] or [facility] if equivalent level of care is available:: Yes  Covid Evaluation: Asymptomatic - no  recent exposure (last 10 days) testing not required  Diagnosis: Sepsis secondary to UTI [facility]) [[id number]]  Admitting Physician: Darlin Drop [[id number]]  Attending Physician: Darlin Drop [[id number]]  Certification:: I certify this patient will need inpatient services for at least 2 midnights  Estimated Length of Stay: 2          B Medical/Surgery History Past Medical History:  Diagnosis Date   Acute kidney injury (HCC) 08/05/2016   Aftercare following surgery of the circulatory system, NEC 05/11/2013   AKI (acute kidney injury) (HCC) 08/04/2016   Anxiety    takes Celexa daily   ARF (acute renal failure) (HCC) 11/07/2016   Asymptomatic stenosis of right carotid artery 03/22/2016   Back pain    occasionally   Carotid artery disease (HCC) 04/16/2013   Carotid artery occlusion    Carotid stenosis 11/16/2013   Cataract    left and immature   Coronary artery disease    Coronary atherosclerosis of native coronary artery 03/11/2013   S/p CABG in 1997    Depression    Diabetes mellitus    takes Metformin and Glipizide daily   Diabetes mellitus (HCC) 05/02/2015   Dizziness    takes Meclizine daily as needed   Essential hypertension, benign 03/11/2013   GERD (gastroesophageal reflux disease)    takes Omeprazole daily as needed   Headache(784.0)    Hyperlipidemia    takes Atorvastatin daily   Hypertension    takes Carvedilol daily   Mixed hyperlipidemia 03/11/2013   Muscle spasm  takes Robaxin daily as needed   Nausea    takes Phenergan daily as needed   Occlusion and stenosis of carotid artery without mention of cerebral infarction 07/19/2011   Pneumonia    hx of-in high school   Restless leg    takes Requip daily as needed   Seasonal allergies    takes Claritin daily as needed and Afrin as needed   Shortness of breath    with exertion   Urinary urgency    UTI (urinary tract infection) 11/07/2016   Past Surgical History:  Procedure Laterality Date   COLONOSCOPY WITH PROPOFOL  N/A 03/10/2012   Procedure: COLONOSCOPY WITH PROPOFOL;  Surgeon: Charolett Bumpers, MD;  Location: [facility] ENDOSCOPY;  Service: Endoscopy;  Laterality: N/A;   CORNEAL TRANSPLANT Right    CORONARY ARTERY BYPASS GRAFT  1997   x 6   CORONARY ARTERY BYPASS GRAFT  Jan. 1997   ENDARTERECTOMY Left 04/16/2013   Procedure: Left Carotid Artery Endatarectomy with Resection of Redundant Internal Carotid Artery;  Surgeon: Larina Earthly, MD;  [facility];  Service: Vascular;  Laterality: Left;   ENDARTERECTOMY Right 03/22/2016   Procedure: RIGHT ENDARTERECTOMY CAROTID;  Surgeon: Larina Earthly, MD;  Location: [facility] OR;  Service: Vascular;  Laterality: Right;   ESOPHAGOGASTRODUODENOSCOPY N/A 03/10/2012   Procedure: ESOPHAGOGASTRODUODENOSCOPY (EGD);  Surgeon: Charolett Bumpers, MD;  Location: [facility];  Service: Endoscopy;  Laterality: N/A;   EYE SURGERY  March 12, 2001   CORNEA TRANSPLANT Right eye   PATCH ANGIOPLASTY Right 03/22/2016   Procedure: PATCH ANGIOPLASTY;  Surgeon: Larina Earthly, MD;  Location: [facility] OR;  Service: Vascular;  Laterality: Right;   PR VEIN BYPASS GRAFT,AORTO-FEM-POP  1997   SPINE SURGERY  march 2013   Back surgery   TONSILLECTOMY     TRIGGER FINGER RELEASE Left    thumb     A IV Location/Drains/Wounds Patient Lines/Drains/Airways Status     Active Line/Drains/Airways     Name Placement date Placement time Site Days   Peripheral IV 05/28/22 20 G Left Hand 05/28/22  0022  Hand  less than 1   Peripheral IV 05/28/22 20 G Anterior;Proximal;Right Forearm 05/28/22  0130  Forearm  less than 1            Intake/Output Last 24 hours  Intake/Output Summary (Last 24 hours) at 05/28/2022 1434 Last data filed at 05/28/2022 1610 Gross per 24 hour  Intake 3500 ml  Output --  Net 3500 ml    Labs/Imaging Results for orders placed or performed during the hospital encounter of 05/28/22 (from the past 48 hour(s))  Lactic acid, plasma     Status: Abnormal   Collection Time: 05/28/22  1:21 AM   Result Value Ref Range   Lactic Acid, Venous 7.1 (HH) 0.5 - 1.9 mmol/L    Comment: CRITICAL RESULT CALLED TO, READ BACK BY AND VERIFIED WITH K. Soden, RN. [id number] 05/28/22. LPAIT Performed at [facility] Lab, [address], [place], [place] [address]   Comprehensive metabolic panel     Status: Abnormal   Collection Time: 05/28/22  1:21 AM  Result Value Ref Range   Sodium 134 (L) 135 - 145 mmol/L   Potassium 3.8 3.5 - 5.1 mmol/L   Chloride 104 98 - 111 mmol/L   CO2 18 (L) 22 - 32 mmol/L   Glucose, Bld 144 (H) 70 - 99 mg/dL    Comment: Glucose reference range applies only to samples taken after fasting for at  least 8 hours.   BUN 21 8 - 23 mg/dL   Creatinine, Ser 1.61 (H) 0.44 - 1.00 mg/dL   Calcium 7.8 (L) 8.9 - 10.3 mg/dL   Total Protein 4.4 (L) 6.5 - 8.1 g/dL   Albumin 2.0 (L) 3.5 - 5.0 g/dL   AST 11 (L) 15 - 41 U/L   ALT 10 0 - 44 U/L   Alkaline Phosphatase 51 38 - 126 U/L   Total Bilirubin 1.0 0.3 - 1.2 mg/dL   GFR, Estimated 55 (L) >60 mL/min    Comment: (NOTE) Calculated using the CKD-EPI Creatinine Equation (2021)    Anion gap 12 5 - 15    Comment: Performed at [facility] Lab, [address], [place], [place] [address]  CBC with Differential     Status: Abnormal   Collection Time: 05/28/22  1:21 AM  Result Value Ref Range   WBC 8.3 4.0 - 10.5 K/uL   RBC 2.79 (L) 3.87 - 5.11 MIL/uL   Hemoglobin 9.4 (L) 12.0 - 15.0 g/dL   HCT 54.0 (L) 98.1 - 19.1 %   MCV 107.2 (H) 80.0 - 100.0 fL   MCH 33.7 26.0 - 34.0 pg   MCHC 31.4 30.0 - 36.0 g/dL   RDW 47.8 29.5 - 62.1 %   Platelets 121 (L) 150 - 400 K/uL   nRBC 0.0 0.0 - 0.2 %   Neutrophils Relative % 82 %   Neutro Abs 6.8 1.7 - 7.7 K/uL   Lymphocytes Relative 7 %   Lymphs Abs 0.6 (L) 0.7 - 4.0 K/uL   Monocytes Relative 9 %   Monocytes Absolute 0.7 0.1 - 1.0 K/uL   Eosinophils Relative 1 %   Eosinophils Absolute 0.1 0.0 - 0.5 K/uL   Basophils Relative 1 %   Basophils Absolute 0.1 0.0 - 0.1 K/uL   WBC Morphology Mild  Left Shift (1-5% metas, occ myelo)     Comment: INCREASED BANDS (>20% BANDS)   nRBC 0 0 /100 WBC   Immature Granulocytes 1 %   Abs Immature Granulocytes 0.04 0.00 - 0.07 K/uL    Comment: Performed at [facility] Lab, [address] [place], [place] [address]  Protime-INR     Status: Abnormal   Collection Time: 05/28/22  1:30 AM  Result Value Ref Range   Prothrombin Time 16.2 (H) 11.4 - 15.2 seconds   INR 1.3 (H) 0.8 - 1.2    Comment: (NOTE) INR goal varies based on device and disease states. Performed at [facility], [address], [place], [place] [address]   APTT     Status: None   Collection Time: 05/28/22  1:30 AM  Result Value Ref Range   aPTT 33 24 - 36 seconds    Comment: Performed at [facility], [address], [place], [place] [address]  Resp panel by RT-PCR (RSV, Flu A&B, Covid) Anterior Nasal Swab     Status: None   Collection Time: 05/28/22  2:38 AM   Specimen: Anterior Nasal Swab  Result Value Ref Range   SARS Coronavirus 2 by RT PCR NEGATIVE NEGATIVE   Influenza A by PCR NEGATIVE NEGATIVE   Influenza B by PCR NEGATIVE NEGATIVE    Comment: (NOTE) The Xpert Xpress SARS-CoV-2/FLU/RSV plus assay is intended as an aid in the diagnosis of influenza from Nasopharyngeal swab specimens and should not be used as a sole basis for treatment. Nasal washings and aspirates are unacceptable for Xpert Xpress SARS-CoV-2/FLU/RSV testing.  Fact Sheet for Patients: BloggerCourse.com  Fact Sheet for Healthcare Providers: SeriousBroker.it  This test is not yet approved or cleared by the [place] FDA and has been authorized for detection and/or diagnosis of SARS-CoV-2 by FDA under an Emergency Use Authorization (EUA). This EUA will remain in effect (meaning this test can be used) for the duration of the COVID-19 declaration under Section 564(b)(1) of the Act, 21 U.S.C. section 360bbb-3(b)(1), unless the  authorization is terminated or revoked.     Resp Syncytial Virus by PCR NEGATIVE NEGATIVE    Comment: (NOTE) Fact Sheet for Patients: BloggerCourse.com  Fact Sheet for Healthcare Providers: SeriousBroker.it  This test is not yet approved or cleared by the [place] FDA and has been authorized for detection and/or diagnosis of SARS-CoV-2 by FDA under an Emergency Use Authorization (EUA). This EUA will remain in effect (meaning this test can be used) for the duration of the COVID-19 declaration under Section 564(b)(1) of the Act, 21 U.S.C. section 360bbb-3(b)(1), unless the authorization is terminated or revoked.  Performed at [facility] Lab, [address] [place], [place] [address]   Urinalysis, w/ Reflex to Culture (Infection Suspected) -Urine, Clean Catch     Status: Abnormal   Collection Time: 05/28/22  3:12 AM  Result Value Ref Range   Specimen Source URINE, CLEAN CATCH    Color, Urine YELLOW YELLOW   APPearance CLOUDY (A) CLEAR   Specific Gravity, Urine 1.027 1.005 - 1.030   pH 5.0 5.0 - 8.0   Glucose, UA >=500 (A) NEGATIVE mg/dL   Hgb urine dipstick SMALL (A) NEGATIVE   Bilirubin Urine NEGATIVE NEGATIVE   Ketones, ur NEGATIVE NEGATIVE mg/dL   Protein, ur 191 (A) NEGATIVE mg/dL   Nitrite POSITIVE (A) NEGATIVE   Leukocytes,Ua LARGE (A) NEGATIVE   RBC / HPF 0-5 0 - 5 RBC/hpf   WBC, UA >50 0 - 5 WBC/hpf    Comment:        Reflex urine culture not performed if WBC <=10, OR if Squamous epithelial cells >5. If Squamous epithelial cells >5 suggest recollection.    Bacteria, UA MANY (A) NONE SEEN   Squamous Epithelial / HPF 0-5 0 - 5 /HPF    Comment: Performed at [facility] Lab, [address] [place], [place] [address]  Lactic acid, plasma     Status: None   Collection Time: 05/28/22  4:02 AM  Result Value Ref Range   Lactic Acid, Venous 0.8 0.5 - 1.9 mmol/L    Comment: Performed at [facility]  Lab, [address], [place], [place] [address]  Respiratory (~20 pathogens) panel by PCR     Status: None   Collection Time: 05/28/22  8:02 AM   Specimen: Nasopharyngeal Swab; Respiratory  Result Value Ref Range   Adenovirus NOT DETECTED NOT DETECTED   Coronavirus 229E NOT DETECTED NOT DETECTED    Comment: (NOTE) The Coronavirus on the Respiratory Panel, DOES NOT test for the novel  Coronavirus (2019 nCoV)    Coronavirus HKU1 NOT DETECTED NOT DETECTED   Coronavirus NL63 NOT DETECTED NOT DETECTED   Coronavirus OC43 NOT DETECTED NOT DETECTED   Metapneumovirus NOT DETECTED NOT DETECTED   Rhinovirus / Enterovirus NOT DETECTED NOT DETECTED   Influenza A NOT DETECTED NOT DETECTED   Influenza B NOT DETECTED NOT DETECTED   Parainfluenza Virus 1 NOT DETECTED NOT DETECTED   Parainfluenza Virus 2 NOT DETECTED NOT DETECTED   Parainfluenza Virus 3 NOT DETECTED NOT DETECTED  Parainfluenza Virus 4 NOT DETECTED NOT DETECTED   Respiratory Syncytial Virus NOT DETECTED NOT DETECTED   Bordetella pertussis NOT DETECTED NOT DETECTED   Bordetella Parapertussis NOT DETECTED NOT DETECTED   Chlamydophila pneumoniae NOT DETECTED NOT DETECTED   Mycoplasma pneumoniae NOT DETECTED NOT DETECTED    Comment: Performed at [facility], [address], [place], [place] [address]  Strep pneumoniae urinary antigen     Status: None   Collection Time: 05/28/22  8:03 AM  Result Value Ref Range   Strep Pneumo Urinary Antigen NEGATIVE NEGATIVE    Comment:        Infection due to S. pneumoniae cannot be absolutely ruled out since the antigen present may be below the detection limit of the test. Performed at [facility] Lab, [address], [place], [place] [address]   Group A Strep by PCR     Status: None   Collection Time: 05/28/22  8:04 AM   Specimen: Throat; Sterile Swab  Result Value Ref Range   Group A Strep by PCR NOT DETECTED NOT DETECTED    Comment: Performed at [facility] Lab, [address] [place], [place] [address]  Group B strep by PCR     Status: None   Collection Time: 05/28/22  8:04 AM   Specimen: Throat; Genital  Result Value Ref Range   Group B strep by PCR NEGATIVE NEGATIVE    Comment: (NOTE) Intrapartum testing with Xpert GBS assay should be used as an adjunct to other methods available and not used to replace antepartum testing (at 35-[redacted] weeks gestation). Performed at [facility], [address], [place], [place] [address]   CBC     Status: Abnormal   Collection Time: 05/28/22  8:24 AM  Result Value Ref Range   WBC 8.6 4.0 - 10.5 K/uL   RBC 3.02 (L) 3.87 - 5.11 MIL/uL   Hemoglobin 10.1 (L) 12.0 - 15.0 g/dL   HCT 46.9 (L) 62.9 - 52.8 %   MCV 107.0 (H) 80.0 - 100.0 fL   MCH 33.4 26.0 - 34.0 pg   MCHC 31.3 30.0 - 36.0 g/dL   RDW 41.3 24.4 - 01.0 %   Platelets 126 (L) 150 - 400 K/uL   nRBC 0.0 0.0 - 0.2 %    Comment: Performed at [facility] Lab, [address], [place], [place] [address]  Creatinine, serum     Status: Abnormal   Collection Time: 05/28/22  8:24 AM  Result Value Ref Range   Creatinine, Ser 1.37 (H) 0.44 - 1.00 mg/dL   GFR, Estimated 39 (L) >60 mL/min    Comment: (NOTE) Calculated using the CKD-EPI Creatinine Equation (2021) Performed at [facility] Lab, [address], [place], [place] [address]   C-reactive protein     Status: Abnormal   Collection Time: 05/28/22  8:24 AM  Result Value Ref Range   CRP 23.2 (H) <1.0 mg/dL    Comment: Performed at [facility] Lab, [address] [place], [place] [address]  CBG monitoring, ED     Status: Abnormal   Collection Time: 05/28/22  8:45 AM  Result Value Ref Range   Glucose-Capillary 108 (H) 70 - 99 mg/dL    Comment: Glucose reference range applies only to samples taken after fasting for at least 8 hours.  Procalcitonin     Status: None   Collection Time: 05/28/22 10:21 AM  Result Value Ref Range   Procalcitonin 20.83  ng/mL    Comment:        Interpretation: PCT >= 10  ng/mL: Important systemic inflammatory response, almost exclusively due to severe bacterial sepsis or septic shock. (NOTE)       Sepsis PCT Algorithm           Lower Respiratory Tract                                      Infection PCT Algorithm    ----------------------------     ----------------------------         PCT < 0.25 ng/mL                PCT < 0.10 ng/mL          Strongly encourage             Strongly discourage   discontinuation of antibiotics    initiation of antibiotics    ----------------------------     -----------------------------       PCT 0.25 - 0.50 ng/mL            PCT 0.10 - 0.25 ng/mL               OR       >80% decrease in PCT            Discourage initiation of                                            antibiotics      Encourage discontinuation           of antibiotics    ----------------------------     -----------------------------         PCT >= 0.50 ng/mL              PCT 0.26 - 0.50 ng/mL                AND       <80% decrease in PCT             Encourage initiation of                                             antibiotics       Encourage continuation           of antibiotics    ----------------------------     -----------------------------        PCT >= 0.50 ng/mL                  PCT > 0.50 ng/mL               AND         increase in PCT                  Strongly encourage                                      initiation of antibiotics    Strongly encourage escalation           of antibiotics                                     -----------------------------  PCT <= 0.25 ng/mL                                                 OR                                        > 80% decrease in PCT                                      Discontinue / Do not initiate                                             antibiotics  Performed at [facility], [address] [place], [place] [address]   CBG monitoring,  ED     Status: Abnormal   Collection Time: 05/28/22 12:16 PM  Result Value Ref Range   Glucose-Capillary 128 (H) 70 - 99 mg/dL    Comment: Glucose reference range applies only to samples taken after fasting for at least 8 hours.   DG CHEST PORT 1 VIEW  Result Date: 05/28/2022 CLINICAL DATA:  Pneumonia. EXAM: PORTABLE CHEST 1 VIEW COMPARISON:  May 28, 2022. FINDINGS: Stable cardiomegaly. Status post coronary bypass graft. Minimal right basilar subsegmental atelectasis is noted. Left lung is clear. Bony thorax is unremarkable. IMPRESSION: Minimal right basilar subsegmental atelectasis. Aortic Atherosclerosis (ICD10-I70.0). Electronically Signed   By: Lupita Raider M.D.   On: 05/28/2022 11:14   CT ABDOMEN PELVIS W CONTRAST  Result Date: 05/28/2022 CLINICAL DATA:  Abdominal pain. EXAM: CT ABDOMEN AND PELVIS WITH CONTRAST TECHNIQUE: Multidetector CT imaging of the abdomen and pelvis was performed using the standard protocol following bolus administration of intravenous contrast. RADIATION DOSE REDUCTION: This exam was performed according to the departmental dose-optimization program which includes automated exposure control, adjustment of the mA and/or kV according to patient size and/or use of iterative reconstruction technique. CONTRAST:  75mL OMNIPAQUE IOHEXOL 350 MG/ML SOLN COMPARISON:  CT abdomen pelvis dated 02/14/2021. FINDINGS: Lower chest: Right lung base subsegmental atelectasis. Pneumonia is not excluded. There is coronary vascular calcification. No intra-abdominal free air or free fluid. Hepatobiliary: Irregular liver contour suggestive of cirrhosis. Clinical correlation is recommended. No biliary dilatation. The gallbladder is unremarkable. Pancreas: Unremarkable. No pancreatic ductal dilatation or surrounding inflammatory changes. Spleen: Normal in size without focal abnormality. Adrenals/Urinary Tract: The adrenal glands are unremarkable. There is no hydronephrosis on either side. There is a  3 cm left renal inferior pole cyst as seen previously. No imaging follow-up. Mild bilateral perinephric stranding, nonspecific. There is apparent mild enhancement of the left ureteral urothelium as well as mild thickened appearance of the bladder wall. Correlation with urinalysis recommended to exclude UTI. Stomach/Bowel: Several small scattered colonic diverticula without active inflammatory changes. There is moderate stool throughout the colon. There is no bowel obstruction or active inflammation. The appendix is normal. Vascular/Lymphatic: Moderate aortoiliac atherosclerotic disease. The IVC is unremarkable. No portal venous gas. There is no adenopathy. Reproductive: The uterus is grossly unremarkable. No adnexal masses. Other: None Musculoskeletal: Osteopenia with degenerative changes of  the spine. Lower lumbar fusion. No acute osseous pathology. IMPRESSION: 1. Findings concerning for UTI. Correlation with urinalysis recommended. 2. Colonic diverticulosis. No bowel obstruction. Normal appendix. 3. Cirrhosis. 4. Right lung base subsegmental atelectasis. Pneumonia is not excluded. 5.  Aortic Atherosclerosis (ICD10-I70.0). Electronically Signed   By: Elgie Collard M.D.   On: 05/28/2022 03:08   DG Chest Port 1 View  Result Date: 05/28/2022 CLINICAL DATA:  Possible sepsis. EXAM: PORTABLE CHEST 1 VIEW COMPARISON:  02/13/2021. FINDINGS: Heart is enlarged and the mediastinal contour stable. There is atherosclerotic calcification of the aorta. Lung volumes are low with mild atelectasis at the lung bases. No effusion or pneumothorax. Sternotomy wires are present over the midline. No acute osseous abnormality. IMPRESSION: 1. Low lung volumes with atelectasis at the lung bases. 2. Cardiomegaly. Electronically Signed   By: Thornell Sartorius M.D.   On: 05/28/2022 02:25    Pending Labs Unresulted Labs (From admission, onward)     Start     Ordered   06/04/22 0500  Creatinine, serum  (enoxaparin (LOVENOX)    CrCl >/=  30 ml/min)  Weekly,   R     Comments: while on enoxaparin therapy    05/28/22 0719   05/29/22 0500  Basic metabolic panel  Tomorrow morning,   R        05/28/22 0719   05/29/22 0500  CBC  Tomorrow morning,   R        05/28/22 0719   05/28/22 0718  Hemoglobin A1c  Once,   R       Comments: To assess prior glycemic control    05/28/22 0719   05/28/22 0312  Urine Culture  Once,   R        05/28/22 0312   05/28/22 0117  Blood Culture (routine x 2)  (Septic presentation on arrival (screening labs, nursing and treatment orders for obvious sepsis))  BLOOD CULTURE X 2,   STAT      05/28/22 0117            Vitals/Pain Today's Vitals   05/28/22 1007 05/28/22 1025 05/28/22 1145 05/28/22 1200  BP:   (!) 132/53 (!) 106/55  Pulse:   100 77  Resp:   20 18  Temp:  (!) 101.7 F (38.7 C)    TempSrc:  Oral    SpO2:   96% 99%  Weight:      Height:      PainSc: 7        Isolation Precautions No active isolations  Medications Medications  cefTRIAXone (ROCEPHIN) 2 g in sodium chloride 0.9 % 100 mL IVPB (0 g Intravenous Stopped 05/28/22 0211)  insulin aspart (novoLOG) injection 0-9 Units (1 Units Subcutaneous Given 05/28/22 1218)  insulin aspart (novoLOG) injection 0-5 Units (has no administration in time range)  enoxaparin (LOVENOX) injection 40 mg (40 mg Subcutaneous Given 05/28/22 0937)  sodium chloride flush (NS) 0.9 % injection 3 mL (3 mLs Intravenous Not Given 05/28/22 1004)  acetaminophen (TYLENOL) tablet 650 mg (650 mg Oral Given 05/28/22 0937)    Or  acetaminophen (TYLENOL) suppository 650 mg ( Rectal See Alternative 05/28/22 0937)  ondansetron (ZOFRAN) tablet 4 mg (has no administration in time range)    Or  ondansetron (ZOFRAN) injection 4 mg (has no administration in time range)  0.9 %  sodium chloride infusion ( Intravenous New Bag/Given 05/28/22 0943)  ipratropium-albuterol (DUONEB) 0.5-2.5 (3) MG/3ML nebulizer solution 3 mL (has no administration in time range)   HYDROcodone-acetaminophen (NORCO)  10-325 MG per tablet 1 tablet (has no administration in time range)  aspirin EC tablet 81 mg (has no administration in time range)  amLODipine (NORVASC) tablet 10 mg (10 mg Oral Given 05/28/22 1201)  carvedilol (COREG) tablet 12.5 mg (12.5 mg Oral Given 05/28/22 1203)  rosuvastatin (CRESTOR) tablet 20 mg (20 mg Oral Given 05/28/22 1202)  busPIRone (BUSPAR) tablet 7.5 mg (7.5 mg Oral Given 05/28/22 1202)  citalopram (CELEXA) tablet 20 mg (20 mg Oral Given 05/28/22 1203)  magnesium oxide (MAG-OX) tablet 200 mg (200 mg Oral Given 05/28/22 1203)  saccharomyces boulardii (FLORASTOR) capsule 250 mg (has no administration in time range)  oxybutynin (DITROPAN-XL) 24 hr tablet 10 mg (has no administration in time range)  ticagrelor (BRILINTA) tablet 90 mg (90 mg Oral Given 05/28/22 1202)  gabapentin (NEURONTIN) capsule 100 mg (100 mg Oral Given 05/28/22 1204)  methocarbamol (ROBAXIN) tablet 500 mg (has no administration in time range)  lactated ringers bolus 1,000 mL (0 mLs Intravenous Stopped 05/28/22 0520)    And  lactated ringers bolus 1,000 mL (0 mLs Intravenous Stopped 05/28/22 0451)    And  lactated ringers bolus 500 mL (0 mLs Intravenous Stopped 05/28/22 0638)  iohexol (OMNIPAQUE) 350 MG/ML injection 75 mL (75 mLs Intravenous Contrast Given 05/28/22 0231)  azithromycin (ZITHROMAX) 500 mg in sodium chloride 0.9 % 250 mL IVPB (0 mg Intravenous Stopped 05/28/22 1309)    Mobility walks with device     Focused Assessments Sepsis   R Recommendations: See Admitting Provider Note  Report given to:   Additional Notes:

## 2022-05-28 NOTE — ED Notes (Signed)
ED TO INPATIENT HANDOFF REPORT  ED Nurse Name and Phone #: Morrie Sheldon [phone]  [facility] Name/Age/Gender Justin Mend Alfred 82 y.o. female Room/Bed: 5W20C/5W20C-01  Code Status   Code Status: Full Code  Home/SNF/Other Nursing Home Patient oriented to: self, place, time, and situation Is this baseline? Yes   Triage Complete: Triage complete  Chief Complaint Sepsis secondary to UTI (HCC) [A41.9, N39.0] Sepsis, due to unspecified organism, unspecified whether acute organ dysfunction present [facility]) [A41.9]  Triage Note Patient bib [facility] from [facility]. Staff reports she has been lethargic and removed her oxygen which she is dependent on. On arrival EMS states her initial oxygen saturation was 76% after being placed on the nasal cannula her saturations returned to the 90s. EMS reports clear breath sounds but feels there is a possible UTI due to the odor of her urine. Patient alert and oriented on arrival to ED.     Allergies Allergies  Allergen Reactions   Codeine Other (See Comments)    Abnormal behavior    Latex Rash and Other (See Comments)    tears skin    Morphine And Codeine Other (See Comments)    Affects BP and blood sugar.   Cyclobenzaprine     MADE PT SICK- pt unsure if med was cyclobenzaprine or methocarbamol    Methocarbamol Other (See Comments)    MADE PT SICK- pt unsure if med was cyclobenzaprine or methocarbamol  11/09/20 pt currently takes methocarbamol    Level of Care/Admitting Diagnosis ED Disposition     ED Disposition  Admit   Condition  --   Comment  Hospital [facility] [[id number]]  Level of Care: Progressive [102]  Admit to Progressive based on following criteria: MULTISYSTEM THREATS such as stable sepsis, metabolic/electrolyte imbalance with or without encephalopathy that is responding to early treatment.  May [facility] patient to [facility] or [facility] if equivalent level of care is available:: Yes  Covid Evaluation: Asymptomatic - no  recent exposure (last 10 days) testing not required  Diagnosis: Sepsis secondary to UTI [facility]) [[id number]]  Admitting Physician: Darlin Drop [[id number]]  Attending Physician: Darlin Drop [[id number]]  Certification:: I certify this patient will need inpatient services for at least 2 midnights  Estimated Length of Stay: 2          B Medical/Surgery History Past Medical History:  Diagnosis Date   Acute kidney injury (HCC) 08/05/2016   Aftercare following surgery of the circulatory system, NEC 05/11/2013   AKI (acute kidney injury) (HCC) 08/04/2016   Anxiety    takes Celexa daily   ARF (acute renal failure) (HCC) 11/07/2016   Asymptomatic stenosis of right carotid artery 03/22/2016   Back pain    occasionally   Carotid artery disease (HCC) 04/16/2013   Carotid artery occlusion    Carotid stenosis 11/16/2013   Cataract    left and immature   Coronary artery disease    Coronary atherosclerosis of native coronary artery 03/11/2013   S/p CABG in 1997    Depression    Diabetes mellitus    takes Metformin and Glipizide daily   Diabetes mellitus (HCC) 05/02/2015   Dizziness    takes Meclizine daily as needed   Essential hypertension, benign 03/11/2013   GERD (gastroesophageal reflux disease)    takes Omeprazole daily as needed   Headache(784.0)    Hyperlipidemia    takes Atorvastatin daily   Hypertension    takes Carvedilol daily   Mixed hyperlipidemia 03/11/2013   Muscle spasm  takes Robaxin daily as needed   Nausea    takes Phenergan daily as needed   Occlusion and stenosis of carotid artery without mention of cerebral infarction 07/19/2011   Pneumonia    hx of-in high school   Restless leg    takes Requip daily as needed   Seasonal allergies    takes Claritin daily as needed and Afrin as needed   Shortness of breath    with exertion   Urinary urgency    UTI (urinary tract infection) 11/07/2016   Past Surgical History:  Procedure Laterality Date   COLONOSCOPY WITH PROPOFOL  N/A 03/10/2012   Procedure: COLONOSCOPY WITH PROPOFOL;  Surgeon: Charolett Bumpers, MD;  Location: [facility];  Service: Endoscopy;  Laterality: N/A;   CORNEAL TRANSPLANT Right    CORONARY ARTERY BYPASS GRAFT  1997   x 6   CORONARY ARTERY BYPASS GRAFT  Jan. 1997   ENDARTERECTOMY Left 04/16/2013   Procedure: Left Carotid Artery Endatarectomy with Resection of Redundant Internal Carotid Artery;  Surgeon: Larina Earthly, MD;  Location: [facility] OR;  Service: Vascular;  Laterality: Left;   ENDARTERECTOMY Right 03/22/2016   Procedure: RIGHT ENDARTERECTOMY CAROTID;  Surgeon: Larina Earthly, MD;  [facility];  Service: Vascular;  Laterality: Right;   ESOPHAGOGASTRODUODENOSCOPY N/A 03/10/2012   Procedure: ESOPHAGOGASTRODUODENOSCOPY (EGD);  Surgeon: Charolett Bumpers, MD;  Location: [facility];  Service: Endoscopy;  Laterality: N/A;   EYE SURGERY  March 12, 2001   CORNEA TRANSPLANT Right eye   PATCH ANGIOPLASTY Right 03/22/2016   Procedure: PATCH ANGIOPLASTY;  Surgeon: Larina Earthly, MD;  Location[facility];  Service: Vascular;  Laterality: Right;   PR VEIN BYPASS GRAFT,AORTO-FEM-POP  1997   SPINE SURGERY  march 2013   Back surgery   TONSILLECTOMY     TRIGGER FINGER RELEASE Left    thumb     A IV Location/Drains/Wounds Patient Lines/Drains/Airways Status     Active Line/Drains/Airways     Name Placement date Placement time Site Days   Peripheral IV 05/28/22 20 G Left Hand 05/28/22  0022  Hand  less than 1   Peripheral IV 05/28/22 20 G Anterior;Proximal;Right Forearm 05/28/22  0130  Forearm  less than 1            Intake/Output Last 24 hours  Intake/Output Summary (Last 24 hours) at 05/28/2022 1432 Last data filed at 05/28/2022 5409 Gross per 24 hour  Intake 3500 ml  Output --  Net 3500 ml    Labs/Imaging Results for orders placed or performed during the hospital encounter of 05/28/22 (from the past 48 hour(s))  Lactic acid, plasma     Status: Abnormal   Collection Time: 05/28/22  1:21 AM   Result Value Ref Range   Lactic Acid, Venous 7.1 (HH) 0.5 - 1.9 mmol/L    Comment: CRITICAL RESULT CALLED TO, READ BACK BY AND VERIFIED WITH K. Fedder, RN. [id number] 05/28/22. LPAIT Performed at [facility] Lab, [address], [place], [place] [address]   Comprehensive metabolic panel     Status: Abnormal   Collection Time: 05/28/22  1:21 AM  Result Value Ref Range   Sodium 134 (L) 135 - 145 mmol/L   Potassium 3.8 3.5 - 5.1 mmol/L   Chloride 104 98 - 111 mmol/L   CO2 18 (L) 22 - 32 mmol/L   Glucose, Bld 144 (H) 70 - 99 mg/dL    Comment: Glucose reference range applies only to samples taken after fasting for at  least 8 hours.   BUN 21 8 - 23 mg/dL   Creatinine, Ser 1.61 (H) 0.44 - 1.00 mg/dL   Calcium 7.8 (L) 8.9 - 10.3 mg/dL   Total Protein 4.4 (L) 6.5 - 8.1 g/dL   Albumin 2.0 (L) 3.5 - 5.0 g/dL   AST 11 (L) 15 - 41 U/L   ALT 10 0 - 44 U/L   Alkaline Phosphatase 51 38 - 126 U/L   Total Bilirubin 1.0 0.3 - 1.2 mg/dL   GFR, Estimated 55 (L) >60 mL/min    Comment: (NOTE) Calculated using the CKD-EPI Creatinine Equation (2021)    Anion gap 12 5 - 15    Comment: Performed at [facility] Lab, [address] [place], [place] [address]  CBC with Differential     Status: Abnormal   Collection Time: 05/28/22  1:21 AM  Result Value Ref Range   WBC 8.3 4.0 - 10.5 K/uL   RBC 2.79 (L) 3.87 - 5.11 MIL/uL   Hemoglobin 9.4 (L) 12.0 - 15.0 g/dL   HCT 54.0 (L) 98.1 - 19.1 %   MCV 107.2 (H) 80.0 - 100.0 fL   MCH 33.7 26.0 - 34.0 pg   MCHC 31.4 30.0 - 36.0 g/dL   RDW 47.8 29.5 - 62.1 %   Platelets 121 (L) 150 - 400 K/uL   nRBC 0.0 0.0 - 0.2 %   Neutrophils Relative % 82 %   Neutro Abs 6.8 1.7 - 7.7 K/uL   Lymphocytes Relative 7 %   Lymphs Abs 0.6 (L) 0.7 - 4.0 K/uL   Monocytes Relative 9 %   Monocytes Absolute 0.7 0.1 - 1.0 K/uL   Eosinophils Relative 1 %   Eosinophils Absolute 0.1 0.0 - 0.5 K/uL   Basophils Relative 1 %   Basophils Absolute 0.1 0.0 - 0.1 K/uL   WBC Morphology Mild  Left Shift (1-5% metas, occ myelo)     Comment: INCREASED BANDS (>20% BANDS)   nRBC 0 0 /100 WBC   Immature Granulocytes 1 %   Abs Immature Granulocytes 0.04 0.00 - 0.07 K/uL    Comment: Performed at [facility] Lab, [address], [place], [place] [address]  Protime-INR     Status: Abnormal   Collection Time: 05/28/22  1:30 AM  Result Value Ref Range   Prothrombin Time 16.2 (H) 11.4 - 15.2 seconds   INR 1.3 (H) 0.8 - 1.2    Comment: (NOTE) INR goal varies based on device and disease states. Performed at [facility] Lab, [address], [place], [place] [address]   APTT     Status: None   Collection Time: 05/28/22  1:30 AM  Result Value Ref Range   aPTT 33 24 - 36 seconds    Comment: Performed at [facility] Lab, [address], [place], [place] [address]  Resp panel by RT-PCR (RSV, Flu A&B, Covid) Anterior Nasal Swab     Status: None   Collection Time: 05/28/22  2:38 AM   Specimen: Anterior Nasal Swab  Result Value Ref Range   SARS Coronavirus 2 by RT PCR NEGATIVE NEGATIVE   Influenza A by PCR NEGATIVE NEGATIVE   Influenza B by PCR NEGATIVE NEGATIVE    Comment: (NOTE) The Xpert Xpress SARS-CoV-2/FLU/RSV plus assay is intended as an aid in the diagnosis of influenza from Nasopharyngeal swab specimens and should not be used as a sole basis for treatment. Nasal washings and aspirates are unacceptable for Xpert Xpress SARS-CoV-2/FLU/RSV testing.  Fact Sheet for Patients: BloggerCourse.com  Fact Sheet for Healthcare Providers: SeriousBroker.it  This test is not yet approved or cleared by the [place] FDA and has been authorized for detection and/or diagnosis of SARS-CoV-2 by FDA under an Emergency Use Authorization (EUA). This EUA will remain in effect (meaning this test can be used) for the duration of the COVID-19 declaration under Section 564(b)(1) of the Act, 21 U.S.C. section 360bbb-3(b)(1), unless the  authorization is terminated or revoked.     Resp Syncytial Virus by PCR NEGATIVE NEGATIVE    Comment: (NOTE) Fact Sheet for Patients: BloggerCourse.com  Fact Sheet for Healthcare Providers: SeriousBroker.it  This test is not yet approved or cleared by the [place] FDA and has been authorized for detection and/or diagnosis of SARS-CoV-2 by FDA under an Emergency Use Authorization (EUA). This EUA will remain in effect (meaning this test can be used) for the duration of the COVID-19 declaration under Section 564(b)(1) of the Act, 21 U.S.C. section 360bbb-3(b)(1), unless the authorization is terminated or revoked.  Performed at [facility] Lab, [address] [place], [place] [address]   Urinalysis, w/ Reflex to Culture (Infection Suspected) -Urine, Clean Catch     Status: Abnormal   Collection Time: 05/28/22  3:12 AM  Result Value Ref Range   Specimen Source URINE, CLEAN CATCH    Color, Urine YELLOW YELLOW   APPearance CLOUDY (A) CLEAR   Specific Gravity, Urine 1.027 1.005 - 1.030   pH 5.0 5.0 - 8.0   Glucose, UA >=500 (A) NEGATIVE mg/dL   Hgb urine dipstick SMALL (A) NEGATIVE   Bilirubin Urine NEGATIVE NEGATIVE   Ketones, ur NEGATIVE NEGATIVE mg/dL   Protein, ur 604 (A) NEGATIVE mg/dL   Nitrite POSITIVE (A) NEGATIVE   Leukocytes,Ua LARGE (A) NEGATIVE   RBC / HPF 0-5 0 - 5 RBC/hpf   WBC, UA >50 0 - 5 WBC/hpf    Comment:        Reflex urine culture not performed if WBC <=10, OR if Squamous epithelial cells >5. If Squamous epithelial cells >5 suggest recollection.    Bacteria, UA MANY (A) NONE SEEN   Squamous Epithelial / HPF 0-5 0 - 5 /HPF    Comment: Performed at [facility] Lab, [address], [place], [place] [address]  Lactic acid, plasma     Status: None   Collection Time: 05/28/22  4:02 AM  Result Value Ref Range   Lactic Acid, Venous 0.8 0.5 - 1.9 mmol/L    Comment: Performed at [facility]  Lab, [address] [place], [place] [address]  Respiratory (~20 pathogens) panel by PCR     Status: None   Collection Time: 05/28/22  8:02 AM   Specimen: Nasopharyngeal Swab; Respiratory  Result Value Ref Range   Adenovirus NOT DETECTED NOT DETECTED   Coronavirus 229E NOT DETECTED NOT DETECTED    Comment: (NOTE) The Coronavirus on the Respiratory Panel, DOES NOT test for the novel  Coronavirus (2019 nCoV)    Coronavirus HKU1 NOT DETECTED NOT DETECTED   Coronavirus NL63 NOT DETECTED NOT DETECTED   Coronavirus OC43 NOT DETECTED NOT DETECTED   Metapneumovirus NOT DETECTED NOT DETECTED   Rhinovirus / Enterovirus NOT DETECTED NOT DETECTED   Influenza A NOT DETECTED NOT DETECTED   Influenza B NOT DETECTED NOT DETECTED   Parainfluenza Virus 1 NOT DETECTED NOT DETECTED   Parainfluenza Virus 2 NOT DETECTED NOT DETECTED   Parainfluenza Virus 3 NOT DETECTED NOT DETECTED  Parainfluenza Virus 4 NOT DETECTED NOT DETECTED   Respiratory Syncytial Virus NOT DETECTED NOT DETECTED   Bordetella pertussis NOT DETECTED NOT DETECTED   Bordetella Parapertussis NOT DETECTED NOT DETECTED   Chlamydophila pneumoniae NOT DETECTED NOT DETECTED   Mycoplasma pneumoniae NOT DETECTED NOT DETECTED    Comment: Performed at [facility] Lab, [address] [place], [place] [address]  Strep pneumoniae urinary antigen     Status: None   Collection Time: 05/28/22  8:03 AM  Result Value Ref Range   Strep Pneumo Urinary Antigen NEGATIVE NEGATIVE    Comment:        Infection due to S. pneumoniae cannot be absolutely ruled out since the antigen present may be below the detection limit of the test. Performed at [facility] Lab, [address] [place], [place] [address]   Group A Strep by PCR     Status: None   Collection Time: 05/28/22  8:04 AM   Specimen: Throat; Sterile Swab  Result Value Ref Range   Group A Strep by PCR NOT DETECTED NOT DETECTED    Comment: Performed at [facility] Lab, [address], [place], [place] [address]  Group B strep by PCR     Status: None   Collection Time: 05/28/22  8:04 AM   Specimen: Throat; Genital  Result Value Ref Range   Group B strep by PCR NEGATIVE NEGATIVE    Comment: (NOTE) Intrapartum testing with Xpert GBS assay should be used as an adjunct to other methods available and not used to replace antepartum testing (at 35-[redacted] weeks gestation). Performed at [facility] Lab, [address] [place], [place] [address]   CBC     Status: Abnormal   Collection Time: 05/28/22  8:24 AM  Result Value Ref Range   WBC 8.6 4.0 - 10.5 K/uL   RBC 3.02 (L) 3.87 - 5.11 MIL/uL   Hemoglobin 10.1 (L) 12.0 - 15.0 g/dL   HCT 38.7 (L) 56.4 - 33.2 %   MCV 107.0 (H) 80.0 - 100.0 fL   MCH 33.4 26.0 - 34.0 pg   MCHC 31.3 30.0 - 36.0 g/dL   RDW 95.1 88.4 - 16.6 %   Platelets 126 (L) 150 - 400 K/uL   nRBC 0.0 0.0 - 0.2 %    Comment: Performed at [facility] Lab, [address], [place], [place] [address]  Creatinine, serum     Status: Abnormal   Collection Time: 05/28/22  8:24 AM  Result Value Ref Range   Creatinine, Ser 1.37 (H) 0.44 - 1.00 mg/dL   GFR, Estimated 39 (L) >60 mL/min    Comment: (NOTE) Calculated using the CKD-EPI Creatinine Equation (2021) Performed at [facility] Lab, [address] [place], [place] [address]   C-reactive protein     Status: Abnormal   Collection Time: 05/28/22  8:24 AM  Result Value Ref Range   CRP 23.2 (H) <1.0 mg/dL    Comment: Performed at [facility] Lab, [address] [place], [place] [address]  CBG monitoring, ED     Status: Abnormal   Collection Time: 05/28/22  8:45 AM  Result Value Ref Range   Glucose-Capillary 108 (H) 70 - 99 mg/dL    Comment: Glucose reference range applies only to samples taken after fasting for at least 8 hours.  Procalcitonin     Status: None   Collection Time: 05/28/22 10:21 AM  Result Value Ref Range   Procalcitonin 20.83  ng/mL    Comment:        Interpretation: PCT >= 10  ng/mL: Important systemic inflammatory response, almost exclusively due to severe bacterial sepsis or septic shock. (NOTE)       Sepsis PCT Algorithm           Lower Respiratory Tract                                      Infection PCT Algorithm    ----------------------------     ----------------------------         PCT < 0.25 ng/mL                PCT < 0.10 ng/mL          Strongly encourage             Strongly discourage   discontinuation of antibiotics    initiation of antibiotics    ----------------------------     -----------------------------       PCT 0.25 - 0.50 ng/mL            PCT 0.10 - 0.25 ng/mL               OR       >80% decrease in PCT            Discourage initiation of                                            antibiotics      Encourage discontinuation           of antibiotics    ----------------------------     -----------------------------         PCT >= 0.50 ng/mL              PCT 0.26 - 0.50 ng/mL                AND       <80% decrease in PCT             Encourage initiation of                                             antibiotics       Encourage continuation           of antibiotics    ----------------------------     -----------------------------        PCT >= 0.50 ng/mL                  PCT > 0.50 ng/mL               AND         increase in PCT                  Strongly encourage                                      initiation of antibiotics    Strongly encourage escalation           of antibiotics                                     -----------------------------  PCT <= 0.25 ng/mL                                                 OR                                        > 80% decrease in PCT                                      Discontinue / Do not initiate                                             antibiotics  Performed at [facility] Lab, [address], [place], [place] [address]   CBG monitoring,  ED     Status: Abnormal   Collection Time: 05/28/22 12:16 PM  Result Value Ref Range   Glucose-Capillary 128 (H) 70 - 99 mg/dL    Comment: Glucose reference range applies only to samples taken after fasting for at least 8 hours.   DG CHEST PORT 1 VIEW  Result Date: 05/28/2022 CLINICAL DATA:  Pneumonia. EXAM: PORTABLE CHEST 1 VIEW COMPARISON:  May 28, 2022. FINDINGS: Stable cardiomegaly. Status post coronary bypass graft. Minimal right basilar subsegmental atelectasis is noted. Left lung is clear. Bony thorax is unremarkable. IMPRESSION: Minimal right basilar subsegmental atelectasis. Aortic Atherosclerosis (ICD10-I70.0). Electronically Signed   By: Lupita Raider M.D.   On: 05/28/2022 11:14   CT ABDOMEN PELVIS W CONTRAST  Result Date: 05/28/2022 CLINICAL DATA:  Abdominal pain. EXAM: CT ABDOMEN AND PELVIS WITH CONTRAST TECHNIQUE: Multidetector CT imaging of the abdomen and pelvis was performed using the standard protocol following bolus administration of intravenous contrast. RADIATION DOSE REDUCTION: This exam was performed according to the departmental dose-optimization program which includes automated exposure control, adjustment of the mA and/or kV according to patient size and/or use of iterative reconstruction technique. CONTRAST:  75mL OMNIPAQUE IOHEXOL 350 MG/ML SOLN COMPARISON:  CT abdomen pelvis dated 02/14/2021. FINDINGS: Lower chest: Right lung base subsegmental atelectasis. Pneumonia is not excluded. There is coronary vascular calcification. No intra-abdominal free air or free fluid. Hepatobiliary: Irregular liver contour suggestive of cirrhosis. Clinical correlation is recommended. No biliary dilatation. The gallbladder is unremarkable. Pancreas: Unremarkable. No pancreatic ductal dilatation or surrounding inflammatory changes. Spleen: Normal in size without focal abnormality. Adrenals/Urinary Tract: The adrenal glands are unremarkable. There is no hydronephrosis on either side. There is a  3 cm left renal inferior pole cyst as seen previously. No imaging follow-up. Mild bilateral perinephric stranding, nonspecific. There is apparent mild enhancement of the left ureteral urothelium as well as mild thickened appearance of the bladder wall. Correlation with urinalysis recommended to exclude UTI. Stomach/Bowel: Several small scattered colonic diverticula without active inflammatory changes. There is moderate stool throughout the colon. There is no bowel obstruction or active inflammation. The appendix is normal. Vascular/Lymphatic: Moderate aortoiliac atherosclerotic disease. The IVC is unremarkable. No portal venous gas. There is no adenopathy. Reproductive: The uterus is grossly unremarkable. No adnexal masses. Other: None Musculoskeletal: Osteopenia with degenerative changes of  the spine. Lower lumbar fusion. No acute osseous pathology. IMPRESSION: 1. Findings concerning for UTI. Correlation with urinalysis recommended. 2. Colonic diverticulosis. No bowel obstruction. Normal appendix. 3. Cirrhosis. 4. Right lung base subsegmental atelectasis. Pneumonia is not excluded. 5.  Aortic Atherosclerosis (ICD10-I70.0). Electronically Signed   By: Elgie Collard M.D.   On: 05/28/2022 03:08   DG Chest Port 1 View  Result Date: 05/28/2022 CLINICAL DATA:  Possible sepsis. EXAM: PORTABLE CHEST 1 VIEW COMPARISON:  02/13/2021. FINDINGS: Heart is enlarged and the mediastinal contour stable. There is atherosclerotic calcification of the aorta. Lung volumes are low with mild atelectasis at the lung bases. No effusion or pneumothorax. Sternotomy wires are present over the midline. No acute osseous abnormality. IMPRESSION: 1. Low lung volumes with atelectasis at the lung bases. 2. Cardiomegaly. Electronically Signed   By: Thornell Sartorius M.D.   On: 05/28/2022 02:25    Pending Labs Unresulted Labs (From admission, onward)     Start     Ordered   06/04/22 0500  Creatinine, serum  (enoxaparin (LOVENOX)    CrCl >/=  30 ml/min)  Weekly,   R     Comments: while on enoxaparin therapy    05/28/22 0719   05/29/22 0500  Basic metabolic panel  Tomorrow morning,   R        05/28/22 0719   05/29/22 0500  CBC  Tomorrow morning,   R        05/28/22 0719   05/28/22 0718  Hemoglobin A1c  Once,   R       Comments: To assess prior glycemic control    05/28/22 0719   05/28/22 0312  Urine Culture  Once,   R        05/28/22 0312   05/28/22 0117  Blood Culture (routine x 2)  (Septic presentation on arrival (screening labs, nursing and treatment orders for obvious sepsis))  BLOOD CULTURE X 2,   STAT      05/28/22 0117            Vitals/Pain Today's Vitals   05/28/22 1007 05/28/22 1025 05/28/22 1145 05/28/22 1200  BP:   (!) 132/53 (!) 106/55  Pulse:   100 77  Resp:   20 18  Temp:  (!) 101.7 F (38.7 C)    TempSrc:  Oral    SpO2:   96% 99%  Weight:      Height:      PainSc: 7        Isolation Precautions No active isolations  Medications Medications  cefTRIAXone (ROCEPHIN) 2 g in sodium chloride 0.9 % 100 mL IVPB (0 g Intravenous Stopped 05/28/22 0211)  insulin aspart (novoLOG) injection 0-9 Units (1 Units Subcutaneous Given 05/28/22 1218)  insulin aspart (novoLOG) injection 0-5 Units (has no administration in time range)  enoxaparin (LOVENOX) injection 40 mg (40 mg Subcutaneous Given 05/28/22 0937)  sodium chloride flush (NS) 0.9 % injection 3 mL (3 mLs Intravenous Not Given 05/28/22 1004)  acetaminophen (TYLENOL) tablet 650 mg (650 mg Oral Given 05/28/22 0937)    Or  acetaminophen (TYLENOL) suppository 650 mg ( Rectal See Alternative 05/28/22 0937)  ondansetron (ZOFRAN) tablet 4 mg (has no administration in time range)    Or  ondansetron (ZOFRAN) injection 4 mg (has no administration in time range)  0.9 %  sodium chloride infusion ( Intravenous New Bag/Given 05/28/22 0943)  ipratropium-albuterol (DUONEB) 0.5-2.5 (3) MG/3ML nebulizer solution 3 mL (has no administration in time range)   HYDROcodone-acetaminophen (NORCO)  10-325 MG per tablet 1 tablet (has no administration in time range)  aspirin EC tablet 81 mg (has no administration in time range)  amLODipine (NORVASC) tablet 10 mg (10 mg Oral Given 05/28/22 1201)  carvedilol (COREG) tablet 12.5 mg (12.5 mg Oral Given 05/28/22 1203)  rosuvastatin (CRESTOR) tablet 20 mg (20 mg Oral Given 05/28/22 1202)  busPIRone (BUSPAR) tablet 7.5 mg (7.5 mg Oral Given 05/28/22 1202)  citalopram (CELEXA) tablet 20 mg (20 mg Oral Given 05/28/22 1203)  magnesium oxide (MAG-OX) tablet 200 mg (200 mg Oral Given 05/28/22 1203)  saccharomyces boulardii (FLORASTOR) capsule 250 mg (has no administration in time range)  oxybutynin (DITROPAN-XL) 24 hr tablet 10 mg (has no administration in time range)  ticagrelor (BRILINTA) tablet 90 mg (90 mg Oral Given 05/28/22 1202)  gabapentin (NEURONTIN) capsule 100 mg (100 mg Oral Given 05/28/22 1204)  methocarbamol (ROBAXIN) tablet 500 mg (has no administration in time range)  lactated ringers bolus 1,000 mL (0 mLs Intravenous Stopped 05/28/22 0520)    And  lactated ringers bolus 1,000 mL (0 mLs Intravenous Stopped 05/28/22 0451)    And  lactated ringers bolus 500 mL (0 mLs Intravenous Stopped 05/28/22 0638)  iohexol (OMNIPAQUE) 350 MG/ML injection 75 mL (75 mLs Intravenous Contrast Given 05/28/22 0231)  azithromycin (ZITHROMAX) 500 mg in sodium chloride 0.9 % 250 mL IVPB (0 mg Intravenous Stopped 05/28/22 1309)    Mobility walks with device     Focused Assessments Sepsis R Recommendations: See Admitting Provider Note  Report given to:   Additional Notes:

## 2022-05-29 ENCOUNTER — Inpatient Hospital Stay (HOSPITAL_COMMUNITY): Payer: Medicare Other

## 2022-05-29 DIAGNOSIS — A419 Sepsis, unspecified organism: Secondary | ICD-10-CM

## 2022-05-29 DIAGNOSIS — N390 Urinary tract infection, site not specified: Secondary | ICD-10-CM

## 2022-05-29 LAB — BASIC METABOLIC PANEL
Anion gap: 8 (ref 5–15)
BUN: 17 mg/dL (ref 8–23)
CO2: 23 mmol/L (ref 22–32)
Calcium: 8.2 mg/dL — ABNORMAL LOW (ref 8.9–10.3)
Chloride: 103 mmol/L (ref 98–111)
Creatinine, Ser: 1.3 mg/dL — ABNORMAL HIGH (ref 0.44–1.00)
GFR, Estimated: 41 mL/min — ABNORMAL LOW (ref >60)
Glucose, Bld: 94 mg/dL (ref 70–99)
Potassium: 3.7 mmol/L (ref 3.5–5.1)
Sodium: 134 mmol/L — ABNORMAL LOW (ref 135–145)

## 2022-05-29 LAB — GLUCOSE, CAPILLARY
Glucose-Capillary: 89 mg/dL (ref 70–99)
Glucose-Capillary: 136 mg/dL — ABNORMAL HIGH (ref 70–99)
Glucose-Capillary: 131 mg/dL — ABNORMAL HIGH (ref 70–99)
Glucose-Capillary: 214 mg/dL — ABNORMAL HIGH (ref 70–99)

## 2022-05-29 LAB — CULTURE, BLOOD (ROUTINE X 2)

## 2022-05-29 LAB — CBC
HCT: 29.6 % — ABNORMAL LOW (ref 36.0–46.0)
Hemoglobin: 9.3 g/dL — ABNORMAL LOW (ref 12.0–15.0)
MCH: 33.5 pg (ref 26.0–34.0)
MCHC: 31.4 g/dL (ref 30.0–36.0)
MCV: 106.5 fL — ABNORMAL HIGH (ref 80.0–100.0)
Platelets: 117 10*3/uL — ABNORMAL LOW (ref 150–400)
RBC: 2.78 MIL/uL — ABNORMAL LOW (ref 3.87–5.11)
RDW: 13.4 % (ref 11.5–15.5)
WBC: 7.1 10*3/uL (ref 4.0–10.5)
nRBC: 0 % (ref 0.0–0.2)

## 2022-05-29 MED ORDER — VITAMIN C 500 MG PO TABS
1000.0000 mg | ORAL_TABLET | Freq: Every day | ORAL | Status: DC
  Administered 2022-05-29 – 2022-05-31 (×3): 1000 mg via ORAL
  Filled 2022-05-29 (×3): qty 2

## 2022-05-29 MED ORDER — SODIUM CHLORIDE 0.9 % IV SOLN
INTRAVENOUS | Status: AC
  Administered 2022-05-29: 75 mL/h via INTRAVENOUS

## 2022-05-29 MED ORDER — VITAMIN B-12 1000 MCG PO TABS
500.0000 ug | ORAL_TABLET | Freq: Every day | ORAL | Status: DC
  Administered 2022-05-29 – 2022-05-31 (×3): 500 ug via ORAL
  Filled 2022-05-29 (×3): qty 1

## 2022-05-29 MED ORDER — LACTATED RINGERS IV SOLN
INTRAVENOUS | Status: AC
  Administered 2022-05-29: 75 mL/h via INTRAVENOUS

## 2022-05-29 MED ORDER — LACTULOSE 10 GM/15ML PO SOLN: 20.0000 g | Freq: Every day | ORAL | Status: DC | PRN

## 2022-05-29 MED ORDER — SENNA 8.6 MG PO TABS
2.0000 | ORAL_TABLET | Freq: Every day | ORAL | Status: DC
  Administered 2022-05-29 – 2022-05-30 (×2): 17.2 mg via ORAL
  Filled 2022-05-29 (×2): qty 2

## 2022-05-29 MED ORDER — POLYVINYL ALCOHOL 1.4 % OP SOLN
2.0000 [drp] | Freq: Two times a day (BID) | OPHTHALMIC | Status: DC
  Administered 2022-05-29 – 2022-05-31 (×2): 2 [drp] via OPHTHALMIC
  Filled 2022-05-29 (×2): qty 15

## 2022-05-29 MED ORDER — ALUM & MAG HYDROXIDE-SIMETH 200-200-20 MG/5ML PO SUSP
30.0000 mL | Freq: Four times a day (QID) | ORAL | Status: DC | PRN
  Administered 2022-05-29: 30 mL via ORAL
  Filled 2022-05-29: qty 30

## 2022-05-29 NOTE — Progress Notes (Signed)
PROGRESS NOTE                                                                                                                                                                                                             Patient Demographics:    Andrea Kirk, is a 82 y.o. female, DOB - Dec 29, 1940, ZOX:[id number]  Outpatient Primary MD for the patient is Georgann Housekeeper, MD    LOS - 1  Admit date - 05/28/2022    Chief Complaint  Patient presents with   Fatigue       Brief Narrative (HPI from H&P)   82 y.o. 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   Subjective:    Andrea Kirk today has, No headache, No chest pain, No abdominal pain - No Nausea, No new weakness tingling or numbness, no SOB   Assessment  & Plan :    Sepsis due to UTI with E. coli bacteremia.  Patient has had multiple episodes of UTIs in the past according to her, monitor for any bladder retention, continue her home oxybutynin, check renal ultrasound to rule out ongoing urinary retention, outpatient urology follow-up, currently on Rocephin, follow culture and sensitivity data.  After hydration sepsis pathophysiology has resolved.  Chronic diastolic CHF.  Currently compensated.  Continue home medication Coreg and Norvasc.  Diuretics on hold due to AKI.  AKI.  Gentle hydration, renal ultrasound and monitor.  Dyslipidemia.  On Crestor.  Depression.  Continue combination of BuSpar and Celexa.  History of pulmonary hypertension with moderate TR.  Supportive care.  History of chronic pain.  On Vicodin, lidocaine patch along  with Neurontin combination.  Continue.    DM type II.  Sliding scale.  CBG (last 3)  Recent Labs    05/28/22 1536 05/28/22 2039 05/29/22 0730  GLUCAP 112* 134* 89        Condition -   Guarded  Family Communication  :  None present  Code Status :  Full  Consults  :  None  PUD Prophylaxis :  Procedures  :     CT - 1. Findings concerning for UTI. Correlation with urinalysis recommended. 2. Colonic diverticulosis. No bowel obstruction. Normal appendix. 3. Cirrhosis. 4. Right lung base subsegmental atelectasis. Pneumonia is not excluded. 5.  Aortic Atherosclerosis.      Disposition Plan  :    Status is: Inpatient   DVT Prophylaxis  :    enoxaparin (LOVENOX) injection 40 mg Start: 05/28/22 0900    Lab Results  Component Value Date   PLT 117 (L) 05/29/2022    Diet :  Diet Order             Diet Carb Modified Fluid consistency: Thin; Room service appropriate? Yes with Assist  Diet effective now                    Inpatient Medications  Scheduled Meds:  amLODipine  10 mg Oral Daily   vitamin C  1,000 mg Oral Daily   aspirin EC  81 mg Oral QPM   busPIRone  7.5 mg Oral BID   carvedilol  12.5 mg Oral BID   citalopram  20 mg Oral Daily   cyanocobalamin  500 mcg Oral Daily   enoxaparin (LOVENOX) injection  40 mg Subcutaneous Q24H   gabapentin  100 mg Oral TID   insulin aspart  0-5 Units Subcutaneous QHS   insulin aspart  0-9 Units Subcutaneous TID WC   lidocaine  1 patch Transdermal Q24H   magnesium oxide  200 mg Oral Daily   oxybutynin  10 mg Oral Daily   polyvinyl alcohol  2 drop Both Eyes BID   rosuvastatin  20 mg Oral Daily   saccharomyces boulardii  250 mg Oral Daily   senna  2 tablet Oral QHS   sodium chloride flush  3 mL Intravenous Q12H   ticagrelor  90 mg Oral BID   Continuous Infusions:  sodium chloride 75 mL/hr (05/29/22 0729)   cefTRIAXone (ROCEPHIN)  IV 2 g (05/29/22 0309)   lactated ringers     PRN Meds:.acetaminophen **OR**  acetaminophen, HYDROcodone-acetaminophen, ipratropium-albuterol, lactulose, methocarbamol, ondansetron **OR** ondansetron (ZOFRAN) IV    Objective:   Vitals:   05/29/22 0500 05/29/22 0600 05/29/22 0729 05/29/22 0900  BP: 117/61 (!) 111/58 106/64 103/67  Pulse: 84 82 82 89  Resp: 20 19 20 18   Temp:      TempSrc:      SpO2: 97% 98% 98% 97%  Weight:      Height:        Wt Readings from Last 3 Encounters:  05/28/22 80 kg  05/21/22 80.3 kg  01/31/22 82.1 kg     Intake/Output Summary (Last 24 hours) at 05/29/2022 0949 Last data filed at 05/28/2022 1600 Gross per 24 hour  Intake 1028.64 ml  Output --  Net 1028.64 ml     Physical Exam  Awake Alert, No new F.N deficits, Normal affect [place].AT,PERRAL Supple Neck, No JVD,   Symmetrical Chest wall movement, Good air movement bilaterally, CTAB RRR,No Gallops,Rubs or new Murmurs,  +ve B.Sounds, Abd Soft, No tenderness,   No Cyanosis, Clubbing or edema        Data Review:    Recent Labs  Lab 05/28/22 0121 05/28/22 0824 05/29/22 0421  WBC 8.3 8.6 7.1  HGB 9.4* 10.1* 9.3*  HCT 29.9* 32.3* 29.6*  PLT 121* 126* 117*  MCV 107.2* 107.0* 106.5*  MCH 33.7 33.4 33.5  MCHC 31.4 31.3 31.4  RDW 13.4 13.5 13.4  LYMPHSABS 0.6*  --   --  MONOABS 0.7  --   --   EOSABS 0.1  --   --   BASOSABS 0.1  --   --     Recent Labs  Lab 05/28/22 0121 05/28/22 0130 05/28/22 0402 05/28/22 0824 05/28/22 1021 05/29/22 0421  NA 134*  --   --   --   --  134*  K 3.8  --   --   --   --  3.7  CL 104  --   --   --   --  103  CO2 18*  --   --   --   --  23  ANIONGAP 12  --   --   --   --  8  GLUCOSE 144*  --   --   --   --  94  BUN 21  --   --   --   --  17  CREATININE 1.03*  --   --  1.37*  --  1.30*  AST 11*  --   --   --   --   --   ALT 10  --   --   --   --   --   ALKPHOS 51  --   --   --   --   --   BILITOT 1.0  --   --   --   --   --   ALBUMIN 2.0*  --   --   --   --   --   CRP  --   --   --  23.2*  --   --   PROCALCITON  --   --    --   --  20.83  --   LATICACIDVEN 7.1*  --  0.8  --   --   --   INR  --  1.3*  --   --   --   --   HGBA1C  --   --   --  8.1*  --   --   CALCIUM 7.8*  --   --   --   --  8.2*      Recent Labs  Lab 05/28/22 0121 05/28/22 0130 05/28/22 0402 05/28/22 0824 05/28/22 1021 05/29/22 0421  CRP  --   --   --  23.2*  --   --   PROCALCITON  --   --   --   --  20.83  --   LATICACIDVEN 7.1*  --  0.8  --   --   --   INR  --  1.3*  --   --   --   --   HGBA1C  --   --   --  8.1*  --   --   CALCIUM 7.8*  --   --   --   --  8.2*   Lab Results  Component Value Date   HGBA1C 8.1 (H) 05/28/2022   Radiology Reports DG CHEST PORT 1 VIEW  Result Date: 05/28/2022 CLINICAL DATA:  Pneumonia. EXAM: PORTABLE CHEST 1 VIEW COMPARISON:  May 28, 2022. FINDINGS: Stable cardiomegaly. Status post coronary bypass graft. Minimal right basilar subsegmental atelectasis is noted. Left lung is clear. Bony thorax is unremarkable. IMPRESSION: Minimal right basilar subsegmental atelectasis. Aortic Atherosclerosis (ICD10-I70.0). Electronically Signed   By: Lupita Raider M.D.   On: 05/28/2022 11:14   CT ABDOMEN PELVIS W CONTRAST  Result Date: 05/28/2022 CLINICAL DATA:  Abdominal pain. EXAM: CT ABDOMEN AND PELVIS  WITH CONTRAST TECHNIQUE: Multidetector CT imaging of the abdomen and pelvis was performed using the standard protocol following bolus administration of intravenous contrast. RADIATION DOSE REDUCTION: This exam was performed according to the departmental dose-optimization program which includes automated exposure control, adjustment of the mA and/or kV according to patient size and/or use of iterative reconstruction technique. CONTRAST:  75mL OMNIPAQUE IOHEXOL 350 MG/ML SOLN COMPARISON:  CT abdomen pelvis dated 02/14/2021. FINDINGS: Lower chest: Right lung base subsegmental atelectasis. Pneumonia is not excluded. There is coronary vascular calcification. No intra-abdominal free air or free fluid. Hepatobiliary:  Irregular liver contour suggestive of cirrhosis. Clinical correlation is recommended. No biliary dilatation. The gallbladder is unremarkable. Pancreas: Unremarkable. No pancreatic ductal dilatation or surrounding inflammatory changes. Spleen: Normal in size without focal abnormality. Adrenals/Urinary Tract: The adrenal glands are unremarkable. There is no hydronephrosis on either side. There is a 3 cm left renal inferior pole cyst as seen previously. No imaging follow-up. Mild bilateral perinephric stranding, nonspecific. There is apparent mild enhancement of the left ureteral urothelium as well as mild thickened appearance of the bladder wall. Correlation with urinalysis recommended to exclude UTI. Stomach/Bowel: Several small scattered colonic diverticula without active inflammatory changes. There is moderate stool throughout the colon. There is no bowel obstruction or active inflammation. The appendix is normal. Vascular/Lymphatic: Moderate aortoiliac atherosclerotic disease. The IVC is unremarkable. No portal venous gas. There is no adenopathy. Reproductive: The uterus is grossly unremarkable. No adnexal masses. Other: None Musculoskeletal: Osteopenia with degenerative changes of the spine. Lower lumbar fusion. No acute osseous pathology. IMPRESSION: 1. Findings concerning for UTI. Correlation with urinalysis recommended. 2. Colonic diverticulosis. No bowel obstruction. Normal appendix. 3. Cirrhosis. 4. Right lung base subsegmental atelectasis. Pneumonia is not excluded. 5.  Aortic Atherosclerosis (ICD10-I70.0). Electronically Signed   By: Elgie Collard M.D.   On: 05/28/2022 03:08   DG Chest Port 1 View  Result Date: 05/28/2022 CLINICAL DATA:  Possible sepsis. EXAM: PORTABLE CHEST 1 VIEW COMPARISON:  02/13/2021. FINDINGS: Heart is enlarged and the mediastinal contour stable. There is atherosclerotic calcification of the aorta. Lung volumes are low with mild atelectasis at the lung bases. No effusion or  pneumothorax. Sternotomy wires are present over the midline. No acute osseous abnormality. IMPRESSION: 1. Low lung volumes with atelectasis at the lung bases. 2. Cardiomegaly. Electronically Signed   By: Thornell Sartorius M.D.   On: 05/28/2022 02:25      Signature  -   Susa Raring M.D on 05/29/2022 at 9:49 AM   -  To page go to [email or web address]

## 2022-05-29 NOTE — Evaluation (Signed)
Occupational Therapy Evaluation Patient Details Name: Andrea Kirk MRN: [id number] DOB: Jan 23, 1940 Today's Date: 05/29/2022   History of Present Illness 82 y.o. 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   Clinical Impression   Pt residing at [facility] where the staff assisted with bathing and brought meals to her. Initiating session 3 different times with pt lethargic and provided additional time prior to therapy eval. On final entry, pt agreeable and pleasant but continues to be lethargic. RN reports several pivots to restroom this morning. Pt able to come to EOB with min A and initiate stand, but not coming to full upright position. Pt with notably decreased activity tolerance, strength, and balance. Will continue to follow to optimize activity tolerance. Patient will benefit from continued inpatient follow up therapy, <3 hours/day     Recommendations for follow up therapy are one component of a multi-disciplinary discharge planning process, led by the attending physician.  Recommendations may be updated based on patient status, additional functional criteria and insurance authorization.   Assistance Recommended at Discharge Intermittent Supervision/Assistance  Patient can return home with the following A little help with walking and/or transfers;A little help with bathing/dressing/bathroom;A lot of help with bathing/dressing/bathroom;Assistance with cooking/housework;Direct supervision/assist for financial management;Direct supervision/assist for medications management;Assist for transportation;Help with stairs or ramp for entrance    Functional Status Assessment  Patient has had a recent decline in their functional status and demonstrates the ability to make significant improvements  in function in a reasonable and predictable amount of time.  Equipment Recommendations  None recommended by OT    Recommendations for Other Services       Precautions / Restrictions Precautions Precautions: Fall Restrictions Weight Bearing Restrictions: No      Mobility Bed Mobility Overal bed mobility: Needs Assistance Bed Mobility: Supine to Sit, Sit to Supine     Supine to sit: Min assist Sit to supine: Min assist   General bed mobility comments: HOB elevated, used bed rail, minA for trunk elevation and to scoot hips to EOB    Transfers Overall transfer level: Needs assistance Equipment used: 1 person hand held assist Transfers: Sit to/from Stand Sit to Stand: Min assist           General transfer comment: Pt reporting she has been up several times today. Initiated stand with min A , but then desatting to 89 and self-intiated return to seated position. Pt reports she feels she has been up and down all she can do today      Balance Overall balance assessment: Needs assistance Sitting-balance support: Feet supported, No upper extremity supported Sitting balance-Leahy Scale: Fair Sitting balance - Comments: able to sitt unsuporrted   Standing balance support: Bilateral upper extremity supported Standing balance-Leahy Scale: Poor Standing balance comment: reliant on external support                           ADL either performed or assessed with clinical judgement   ADL Overall ADL's : Needs assistance/impaired Eating/Feeding: Bed level;Modified independent   Grooming: Set up;Sitting;Min guard   Upper Body Bathing: Min guard;Sitting   Lower Body Bathing: Moderate assistance;Sit to/from stand   Upper Body Dressing : Min guard;Sitting   Lower Body Dressing: Moderate assistance;Sit to/from stand   Toilet Transfer: Minimal Dentist Details (indicate cue  type and reason): STS only this session and pt self initiated back to bed                  Vision Baseline Vision/History: 1 Wears glasses Ability to See in Adequate Light: 0 Adequate Patient Visual Report: No change from baseline Additional Comments: denies visual changes; WFL for tasks assessed     Perception Perception Perception Tested?: No   Praxis Praxis Praxis tested?: Not tested    Pertinent Vitals/Pain Pain Assessment Pain Assessment: Faces Faces Pain Scale: Hurts little more Pain Location: BLE Pain Descriptors / Indicators: Aching Pain Intervention(s): Limited activity within patient's tolerance, Monitored during session     Hand Dominance Right   Extremity/Trunk Assessment Upper Extremity Assessment Upper Extremity Assessment: Generalized weakness   Lower Extremity Assessment Lower Extremity Assessment: Defer to PT evaluation   Cervical / Trunk Assessment Cervical / Trunk Assessment: Normal   Communication Communication Communication: HOH   Cognition Arousal/Alertness: Awake/alert, Lethargic Behavior During Therapy: Flat affect Overall Cognitive Status: No family/caregiver present to determine baseline cognitive functioning                                 General Comments: pt A&Ox4, pt with selective responses and engagement with providers, able to follow commands     General Comments  SpO2 as low as 89% on 4L during mobility. Pt reports she is supposed to wear 2L but on 4 on arrival    Exercises     Shoulder Instructions      Home Living Family/patient expects to be discharged to:: Skilled nursing facility                                 Additional Comments: resides at [facility], reports being there for the last 2 years      Prior Functioning/Environment Prior Level of Function : Needs assist             Mobility Comments: per pt she uses RW and w/c but uses w/c more due to "my breathing". pt reports she sees PT daily at [facility] and works on steps and walking ADLs  Comments: assist for bathing, reports dressing her self, meals brough to her in room        OT Problem List: Decreased strength;Decreased activity tolerance;Impaired balance (sitting and/or standing);Cardiopulmonary status limiting activity      OT Treatment/Interventions: Self-care/ADL training;Therapeutic exercise;DME and/or AE instruction;Patient/family education;Balance training;Therapeutic activities    OT Goals(Current goals can be found in the care plan section) Acute Rehab OT Goals Patient Stated Goal: get better OT Goal Formulation: With patient Time For Goal Achievement: 06/12/22 Potential to Achieve Goals: Good  OT Frequency: Min 2X/week    Co-evaluation              AM-PAC OT "6 Clicks" Daily Activity     Outcome Measure Help from another person eating meals?: None Help from another person taking care of personal grooming?: A Little Help from another person toileting, which includes using toliet, bedpan, or urinal?: A Lot Help from another person bathing (including washing, rinsing, drying)?: A Lot Help from another person to put on and taking off regular upper body clothing?: A Little Help from another person to put on and taking off regular lower body clothing?: A Lot 6 Click Score: 16   End of Session Equipment Utilized During Treatment:  Gait belt Nurse Communication: Mobility status  Activity Tolerance: Patient tolerated treatment well Patient left: in bed;with call bell/phone within reach;with bed alarm set  OT Visit Diagnosis: Unsteadiness on feet (R26.81);Muscle weakness (generalized) (M62.81)                Time: 1610-9604 OT Time Calculation (min): 21 min Charges:  OT General Charges $OT Visit: 1 Visit OT Evaluation $OT Eval Moderate Complexity: 1 Mod  Tyler Deis, OTR/L [facility] Acute Rehabilitation Office: [phone]'   Myrla Halsted 05/29/2022, 5:37 PM

## 2022-05-29 NOTE — Evaluation (Signed)
Physical Therapy Evaluation Patient Details Name: Andrea Kirk MRN: [id number] DOB: August 19, 1940 Today's Date: 05/29/2022  History of Present Illness  Andrea Kirk is a 82 y.o. 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   Clinical Impression  Pt admitted with above. Pt resides at [facility] and has been there for 2 years per patient. Pt selective with response and engagement with providers but ultimately followed commands and was able to transfer OOB to chair with modA via HHA. Pt deferred ambulation and reports using her w/c majority of time. Pt remains appropriate to return to [facility] once medically stable. Acute PT to cont to follow.       Recommendations for follow up therapy are one component of a multi-disciplinary discharge planning process, led by the attending physician.  Recommendations may be updated based on patient status, additional functional criteria and insurance authorization.  Follow Up Recommendations Can patient physically be transported by private vehicle: No     Assistance Recommended at Discharge Frequent or constant Supervision/Assistance  Patient can return home with the following       Equipment Recommendations None recommended by PT  Recommendations for Other Services       Functional Status Assessment Patient has had a recent decline in their functional status and demonstrates the ability to make significant improvements in function in a reasonable and predictable amount of time.     Precautions / Restrictions Precautions Precautions: Fall Restrictions Weight Bearing Restrictions: No      Mobility  Bed Mobility Overal bed mobility: Needs Assistance Bed Mobility: Supine to Sit     Supine to sit: Min assist     General bed mobility  comments: HOB elevated, used bed rail, minA for trunk elevation and to scoot hips to EOB    Transfers Overall transfer level: Needs assistance Equipment used: 1 person hand held assist Transfers: Sit to/from Stand, Bed to chair/wheelchair/BSC Sit to Stand: Min assist   Step pivot transfers: Min assist       General transfer comment: pt reports "I Just use the chair to hold onto" deferring use of RW. Pt give L HHA and use R UE on hand rest of chair. Pt with incresaed time and noted antalgia with when stepping to chair    Ambulation/Gait               General Gait Details: pt deferred  Stairs            Wheelchair Mobility    Modified Rankin (Stroke Patients Only)       Balance Overall balance assessment: Needs assistance Sitting-balance support: Feet supported, No upper extremity supported Sitting balance-Leahy Scale: Fair Sitting balance - Comments: able to sitt unsuporrted   Standing balance support: Bilateral upper extremity supported Standing balance-Leahy Scale: Poor Standing balance comment: reliant on external support                             Pertinent Vitals/Pain Pain Assessment Pain Assessment: No/denies pain    Home Living Family/patient expects to be discharged to:: Skilled nursing facility                   Additional Comments: resides at [facility], reports being there for the last 2 years    Prior Function Prior Level of Function :  Needs assist             Mobility Comments: per pt she uses RW and w/c but uses w/c more due to "my breathing". pt reports she sees PT daily at [facility] and works on steps and walking ADLs Comments: assist for bathing, reports dressing her self, meals brough to her in room     Hand Dominance   Dominant Hand: Right    Extremity/Trunk Assessment   Upper Extremity Assessment Upper Extremity Assessment: Generalized weakness    Lower Extremity Assessment Lower Extremity  Assessment: Generalized weakness    Cervical / Trunk Assessment Cervical / Trunk Assessment: Normal  Communication   Communication: HOH  Cognition Arousal/Alertness: Awake/alert Behavior During Therapy: Flat affect Overall Cognitive Status: No family/caregiver present to determine baseline cognitive functioning                                 General Comments: pt A&Ox4, pt with selective responses and engagement with providers, able to follow commands        General Comments General comments (skin integrity, edema, etc.): SpO2 >95% on 2LO2 via [place], BP 95/63    Exercises     Assessment/Plan    PT Assessment Patient needs continued PT services  PT Problem List Decreased strength;Decreased activity tolerance;Decreased balance;Decreased coordination;Decreased mobility;Decreased cognition       PT Treatment Interventions DME instruction;Gait training;Functional mobility training;Therapeutic exercise;Therapeutic activities;Balance training    PT Goals (Current goals can be found in the Care Plan section)  Acute Rehab PT Goals Patient Stated Goal: home PT Goal Formulation: With patient Time For Goal Achievement: 06/12/22 Potential to Achieve Goals: Good    Frequency Min 2X/week     Co-evaluation               AM-PAC PT "6 Clicks" Mobility  Outcome Measure Help needed turning from your back to your side while in a flat bed without using bedrails?: A Little Help needed moving from lying on your back to sitting on the side of a flat bed without using bedrails?: A Little Help needed moving to and from a bed to a chair (including a wheelchair)?: A Lot Help needed standing up from a chair using your arms (e.g., wheelchair or bedside chair)?: A Lot Help needed to walk in hospital room?: A Lot Help needed climbing 3-5 steps with a railing? : Total 6 Click Score: 13    End of Session Equipment Utilized During Treatment: Gait belt Activity Tolerance: Patient  tolerated treatment well Patient left: in chair;with call bell/phone within reach;with chair alarm set;with nursing/sitter in room Nurse Communication: Mobility status (RN present in room for transfer) PT Visit Diagnosis: Unsteadiness on feet (R26.81);Muscle weakness (generalized) (M62.81)    Time: 1610-9604 PT Time Calculation (min) (ACUTE ONLY): 24 min   Charges:   PT Evaluation $PT Eval Moderate Complexity: 1 Mod PT Treatments $Therapeutic Activity: 8-22 mins        Lewis Shock, PT, DPT Acute Rehabilitation Services Secure chat preferred Office #: [phone]   Iona Hansen 05/29/2022, 12:25 PM

## 2022-05-30 DIAGNOSIS — N390 Urinary tract infection, site not specified: Secondary | ICD-10-CM | POA: Diagnosis not present

## 2022-05-30 DIAGNOSIS — A419 Sepsis, unspecified organism: Secondary | ICD-10-CM | POA: Diagnosis not present

## 2022-05-30 LAB — BASIC METABOLIC PANEL
Anion gap: 10 (ref 5–15)
BUN: 15 mg/dL (ref 8–23)
CO2: 24 mmol/L (ref 22–32)
Calcium: 8.7 mg/dL — ABNORMAL LOW (ref 8.9–10.3)
Chloride: 104 mmol/L (ref 98–111)
Creatinine, Ser: 1.21 mg/dL — ABNORMAL HIGH (ref 0.44–1.00)
GFR, Estimated: 45 mL/min — ABNORMAL LOW (ref >60)
Glucose, Bld: 156 mg/dL — ABNORMAL HIGH (ref 70–99)
Potassium: 3.7 mmol/L (ref 3.5–5.1)
Sodium: 138 mmol/L (ref 135–145)

## 2022-05-30 LAB — GLUCOSE, CAPILLARY
Glucose-Capillary: 164 mg/dL — ABNORMAL HIGH (ref 70–99)
Glucose-Capillary: 136 mg/dL — ABNORMAL HIGH (ref 70–99)
Glucose-Capillary: 176 mg/dL — ABNORMAL HIGH (ref 70–99)
Glucose-Capillary: 159 mg/dL — ABNORMAL HIGH (ref 70–99)

## 2022-05-30 LAB — CBC WITH DIFFERENTIAL/PLATELET
Abs Immature Granulocytes: 0 10*3/uL (ref 0.00–0.07)
Basophils Absolute: 0 10*3/uL (ref 0.0–0.1)
Basophils Relative: 0 %
Eosinophils Absolute: 0 10*3/uL (ref 0.0–0.5)
Eosinophils Relative: 0 %
HCT: 29.8 % — ABNORMAL LOW (ref 36.0–46.0)
Hemoglobin: 9.5 g/dL — ABNORMAL LOW (ref 12.0–15.0)
Lymphocytes Relative: 5 %
Lymphs Abs: 0.3 10*3/uL — ABNORMAL LOW (ref 0.7–4.0)
MCH: 33.6 pg (ref 26.0–34.0)
MCHC: 31.9 g/dL (ref 30.0–36.0)
MCV: 105.3 fL — ABNORMAL HIGH (ref 80.0–100.0)
Monocytes Absolute: 0.4 10*3/uL (ref 0.1–1.0)
Monocytes Relative: 6 %
Neutro Abs: 5.9 10*3/uL (ref 1.7–7.7)
Neutrophils Relative %: 89 %
Platelets: 131 10*3/uL — ABNORMAL LOW (ref 150–400)
RBC: 2.83 MIL/uL — ABNORMAL LOW (ref 3.87–5.11)
RDW: 13.4 % (ref 11.5–15.5)
WBC: 6.6 10*3/uL (ref 4.0–10.5)
nRBC: 0 % (ref 0.0–0.2)
nRBC: 0 /100 WBC

## 2022-05-30 LAB — BRAIN NATRIURETIC PEPTIDE: B Natriuretic Peptide: 231.7 pg/mL — ABNORMAL HIGH (ref 0.0–100.0)

## 2022-05-30 LAB — CULTURE, BLOOD (ROUTINE X 2): Culture: NO GROWTH

## 2022-05-30 LAB — MAGNESIUM: Magnesium: 2.1 mg/dL (ref 1.7–2.4)

## 2022-05-30 LAB — PROCALCITONIN: Procalcitonin: 11.81 ng/mL

## 2022-05-30 LAB — C-REACTIVE PROTEIN: CRP: 29.1 mg/dL — ABNORMAL HIGH (ref <1.0)

## 2022-05-30 MED ORDER — FOSFOMYCIN TROMETHAMINE 3 G PO PACK
3.0000 g | PACK | Freq: Once | ORAL | Status: AC
  Administered 2022-05-30: 3 g via ORAL
  Filled 2022-05-30: qty 3

## 2022-05-30 MED ORDER — MAGIC MOUTHWASH W/LIDOCAINE
5.0000 mL | Freq: Four times a day (QID) | ORAL | Status: DC | PRN
  Filled 2022-05-30: qty 5

## 2022-05-30 MED ORDER — VALACYCLOVIR HCL 500 MG PO TABS
1000.0000 mg | ORAL_TABLET | Freq: Two times a day (BID) | ORAL | Status: DC
  Filled 2022-05-30: qty 2

## 2022-05-30 NOTE — NC FL2 (Signed)
[place] MEDICAID FL2 LEVEL OF CARE FORM     IDENTIFICATION  Patient Name: Andrea Kirk Birthdate: 1940/03/08 Sex: female Admission Date (Current Location): 05/28/2022  [facility], [place] and [place] Number:  Producer, television/film/video and Address:  [facility] [place], [address], [place], [place] [address]      Provider Number: [id number]  Attending Physician Name and Address:  Leroy Sea, MD  Relative Name and Phone Number:       Current Level of Care: Hospital Recommended Level of Care: Skilled Nursing Facility Prior Approval Number:    Date Approved/Denied:   PASRR Number: [id number]  Discharge Plan: SNF    Current Diagnoses: Patient Active Problem List   Diagnosis Date Noted   Sepsis secondary to UTI (HCC) 05/28/2022   Nausea 02/17/2021   C. difficile colitis 02/15/2021   Elevated troponin 02/14/2021   Gram-negative bacteremia 02/14/2021   Severe sepsis (HCC) 02/14/2021   Thrombocytopenia (HCC) 02/13/2021   Acute encephalopathy 02/13/2021   Anxiety and depression 02/13/2021   Closed fracture of acromial process of right scapula 11/21/2020   Syncope, vasovagal 06/23/2020   Pulmonary hypertension (HCC) 04/20/2020   Controlled type 2 diabetes mellitus with hyperglycemia (HCC) 04/20/2020   Essential hypertension 04/20/2020   HLD (hyperlipidemia) 04/20/2020   Acute renal failure superimposed on stage 2 chronic kidney disease (HCC) 04/20/2020   Iron deficiency anemia 04/20/2020   Acute respiratory failure with hypoxia (HCC) 04/19/2020   Obese 04/19/2020   Chronic back pain 04/19/2020   Acute stroke due to ischemia [facility])    hx of CVA (cerebral vascular accident) (HCC) 04/16/2020   Macular retinoschisis, 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   Vitreomacular traction syndrome, 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   Depression 12/22/2017   Chronic pain 12/22/2017   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   Chronic diastolic CHF (congestive heart failure) (HCC) 12/22/2017    Frequent falls 07/16/2016   Carotid artery disease (HCC) 04/16/2013   Occlusion and stenosis of carotid artery without mention of cerebral infarction 07/19/2011    Orientation RESPIRATION BLADDER Height & Weight     Self, Place  O2 (2L Nasal cannula) Incontinent, External catheter Weight: 176 lb 5.9 oz (80 kg) Height:  5\' 5"  (165.1 cm)  BEHAVIORAL SYMPTOMS/MOOD NEUROLOGICAL BOWEL NUTRITION STATUS      Incontinent Diet (See dc summary)  AMBULATORY STATUS COMMUNICATION OF NEEDS Skin   Limited Assist Verbally Normal                       Personal Care Assistance Level of Assistance  Bathing, Feeding, Dressing Bathing Assistance: Limited assistance Feeding assistance: Limited assistance Dressing Assistance: Limited assistance     Functional Limitations Info  Sight Sight Info: Impaired        SPECIAL CARE FACTORS FREQUENCY                       Contractures Contractures Info: Not present    Additional Factors Info  Code Status, Allergies, Insulin Sliding Scale, Psychotropic Code Status Info: Full Allergies Info: Codeine, Latex, Morphine And Codeine, Cyclobenzaprine, Methocarbamol Psychotropic Info: Celexa; Buspar Insulin Sliding Scale Info: See dc summary       Current Medications (05/30/2022):  This is the current hospital active medication list Current Facility-Administered Medications  Medication Dose Route Frequency Provider Last Rate Last Admin   acetaminophen (TYLENOL) tablet 650 mg  650 mg Oral Q6H PRN Russella Dar, NP   650 mg at 05/29/22 1635  Or   acetaminophen (TYLENOL) suppository 650 mg  650 mg Rectal Q6H PRN Russella Dar, NP       alum & mag hydroxide-simeth (MAALOX/MYLANTA) 200-200-20 MG/5ML suspension 30 mL  30 mL Oral Q6H PRN Leroy Sea, MD   30 mL at 05/29/22 2100   amLODipine (NORVASC) tablet 10 mg  10 mg Oral Daily Russella Dar, NP   10 mg at 05/30/22 4782   ascorbic acid (VITAMIN C) tablet 1,000 mg  1,000 mg Oral Daily  Leroy Sea, MD   1,000 mg at 05/30/22 9562   aspirin EC tablet 81 mg  81 mg Oral QPM Russella Dar, NP   81 mg at 05/29/22 1634   busPIRone (BUSPAR) tablet 7.5 mg  7.5 mg Oral BID Russella Dar, NP   7.5 mg at 05/30/22 0835   carvedilol (COREG) tablet 12.5 mg  12.5 mg Oral BID Russella Dar, NP   12.5 mg at 05/30/22 0834   cefTRIAXone (ROCEPHIN) 2 g in sodium chloride 0.9 % 100 mL IVPB  2 g Intravenous Q24H Sabas Sous, MD 200 mL/hr at 05/30/22 0139 2 g at 05/30/22 0139   citalopram (CELEXA) tablet 20 mg  20 mg Oral Daily Russella Dar, NP   20 mg at 05/30/22 1308   cyanocobalamin (VITAMIN B12) tablet 500 mcg  500 mcg Oral Daily Leroy Sea, MD   500 mcg at 05/30/22 0834   enoxaparin (LOVENOX) injection 40 mg  40 mg Subcutaneous Q24H Russella Dar, NP   40 mg at 05/30/22 6578   gabapentin (NEURONTIN) capsule 100 mg  100 mg Oral TID Russella Dar, NP   100 mg at 05/30/22 0835   HYDROcodone-acetaminophen (NORCO) 10-325 MG per tablet 1 tablet  1 tablet Oral Q6H PRN Russella Dar, NP   1 tablet at 05/30/22 1152   insulin aspart (novoLOG) injection 0-5 Units  0-5 Units Subcutaneous QHS Russella Dar, NP   3 Units at 05/29/22 2100   insulin aspart (novoLOG) injection 0-9 Units  0-9 Units Subcutaneous TID WC Russella Dar, NP   1 Units at 05/30/22 1152   ipratropium-albuterol (DUONEB) 0.5-2.5 (3) MG/3ML nebulizer solution 3 mL  3 mL Nebulization Q6H PRN Russella Dar, NP       lactulose (CHRONULAC) 10 GM/15ML solution 20 g  20 g Oral Daily PRN Leroy Sea, MD       lidocaine (LIDODERM) 5 % 1 patch  1 patch Transdermal Q24H Howerter, Justin B, DO   1 patch at 05/30/22 0023   magnesium oxide (MAG-OX) tablet 200 mg  200 mg Oral Daily Russella Dar, NP   200 mg at 05/30/22 0835   methocarbamol (ROBAXIN) tablet 500 mg  500 mg Oral Q8H PRN Russella Dar, NP   500 mg at 05/28/22 1638   ondansetron (ZOFRAN) tablet 4 mg  4 mg Oral Q6H PRN Russella Dar,  NP       Or   ondansetron [facility]) injection 4 mg  4 mg Intravenous Q6H PRN Russella Dar, NP       oxybutynin (DITROPAN-XL) 24 hr tablet 10 mg  10 mg Oral Daily Russella Dar, NP   10 mg at 05/29/22 2100   polyvinyl alcohol (LIQUIFILM TEARS) 1.4 % ophthalmic solution 2 drop  2 drop Both Eyes BID Leroy Sea, MD   2 drop at 05/29/22 0913   rosuvastatin (CRESTOR) tablet 20 mg  20  mg Oral Daily Russella Dar, NP   20 mg at 05/30/22 4098   saccharomyces boulardii (FLORASTOR) capsule 250 mg  250 mg Oral Daily Russella Dar, NP   250 mg at 05/30/22 1191   senna (SENOKOT) tablet 17.2 mg  2 tablet Oral QHS Susa Raring K, MD   17.2 mg at 05/29/22 2100   sodium chloride flush (NS) 0.9 % injection 3 mL  3 mL Intravenous Q12H Russella Dar, NP   3 mL at 05/30/22 4782   ticagrelor (BRILINTA) tablet 90 mg  90 mg Oral BID Russella Dar, NP   90 mg at 05/30/22 9562     Discharge Medications: Please see discharge summary for a list of discharge medications.  Relevant Imaging Results:  Relevant Lab Results:   Additional Information SS#: [id number]  Mearl Latin, LCSW

## 2022-05-30 NOTE — TOC Initial Note (Addendum)
Transition of Care [facility]) - Initial/Assessment Note    Patient Details  Name: Andrea Kirk MRN: [id number] Date of Birth: 06-01-1940  Transition of Care [facility]) CM/SW Contact:    Mearl Latin, LCSW Phone Number: 05/30/2022, 2:37 PM  Clinical Narrative:                 Patient resides at [facility] under long term care. CSW will continue to follow for return upon medical stability.   CSW spoke with patient's brother and he reported agreement with plan.  Expected Discharge Plan: Skilled Nursing Facility Barriers to Discharge: Continued Medical Work up   Patient Goals and CMS Choice Patient states their goals for this hospitalization and ongoing recovery are:: Return to SNF CMS Medicare.gov Compare Post Acute Care list provided to:: Patient Represenative (must comment) Choice offered to / list presented to : Sibling [place] ownership interest in [facility].provided to:: Sibling    Expected Discharge Plan and Services In-house Referral: Clinical Social Work   Post Acute Care Choice: Skilled Nursing Facility Living arrangements for the past 2 months: Skilled Nursing Facility                                      Prior Living Arrangements/Services Living arrangements for the past 2 months: Skilled Nursing Facility Lives with:: Facility Resident Patient language and need for interpreter reviewed:: Yes Do you feel safe going back to the place where you live?: Yes      Need for Family Participation in Patient Care: Yes (Comment) Care giver support system in place?: Yes (comment)   Criminal Activity/Legal Involvement Pertinent to Current Situation/Hospitalization: No - Comment as needed  Activities of Daily Living Home Assistive Devices/Equipment: Environmental consultant (specify type), Wheelchair ADL Screening (condition at time of admission) Patient's cognitive ability adequate to safely complete daily activities?: Yes Is the patient deaf or have difficulty hearing?:  No Does the patient have difficulty seeing, even when wearing glasses/contacts?: No Does the patient have difficulty concentrating, remembering, or making decisions?: No Patient able to express need for assistance with ADLs?: Yes Does the patient have difficulty dressing or bathing?: Yes Independently performs ADLs?: No Communication: Independent Dressing (OT): Needs assistance Is this a change from baseline?: Pre-admission baseline Does the patient have difficulty walking or climbing stairs?: Yes Weakness of Legs: Both Weakness of Arms/Hands: None  Permission Sought/Granted Permission sought to share information with : Facility Medical sales representative, Family Supports Permission granted to share information with : No  Share Information with NAME: Dr. Windy Fast Wenzl  Permission granted to share info w AGENCY: [facility]  Permission granted to share info w Relationship: Brother  Permission granted to share info w Contact Information: [phone]  Emotional Assessment Appearance:: Appears stated age Attitude/Demeanor/Rapport: Unable to Assess Affect (typically observed): Unable to Assess Orientation: : Oriented to Self, Oriented to Place Alcohol / Substance Use: Not Applicable Psych Involvement: No (comment)  Admission diagnosis:  Sepsis secondary to UTI (HCC) [A41.9, N39.0] Sepsis, due to unspecified organism, unspecified whether acute organ dysfunction present [facility]) [A41.9] Patient Active Problem List   Diagnosis Date Noted   Sepsis secondary to UTI (HCC) 05/28/2022   Nausea 02/17/2021   C. difficile colitis 02/15/2021   Elevated troponin 02/14/2021   Gram-negative bacteremia 02/14/2021   Severe sepsis (HCC) 02/14/2021   Thrombocytopenia (HCC) 02/13/2021   Acute encephalopathy 02/13/2021   Anxiety and depression 02/13/2021   Closed fracture of  acromial process of right scapula 11/21/2020   Syncope, vasovagal 06/23/2020   Pulmonary hypertension (HCC) 04/20/2020   Controlled  type 2 diabetes mellitus with hyperglycemia (HCC) 04/20/2020   Essential hypertension 04/20/2020   HLD (hyperlipidemia) 04/20/2020   Acute renal failure superimposed on stage 2 chronic kidney disease (HCC) 04/20/2020   Iron deficiency anemia 04/20/2020   Acute respiratory failure with hypoxia (HCC) 04/19/2020   Obese 04/19/2020   Chronic back pain 04/19/2020   Acute stroke due to ischemia [facility])    hx of CVA (cerebral vascular accident) (HCC) 04/16/2020   Macular retinoschisis, 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   Vitreomacular traction syndrome, 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   Depression 12/22/2017   Chronic pain 12/22/2017   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 [place] [address] Phone: [phone] Fax: [phone]     Social Determinants of Health (SDOH) Social History: SDOH Screenings   Tobacco Use: Low Risk  (05/28/2022)   SDOH Interventions:     Readmission Risk Interventions     No data to display

## 2022-05-30 NOTE — Progress Notes (Signed)
PROGRESS NOTE                                                                                                                                                                                                             Patient Demographics:    Andrea Kirk, is a 82 y.o. female, DOB - 05/21/1940, WUJ:[id number]  Outpatient Primary MD for the patient is Georgann Housekeeper, MD    LOS - 2  Admit date - 05/28/2022    Chief Complaint  Patient presents with   Fatigue       Brief Narrative (HPI from H&P)   82 y.o. 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   Subjective:    Kirk Andrea today has, No headache, No chest pain, No abdominal pain - No Nausea, No new weakness tingling or numbness, no SOB   Assessment  & Plan :    Sepsis due to UTI with E. coli bacteremia.  Patient has had multiple episodes of UTIs in the past according to her, monitor for any bladder retention, continue her home oxybutynin, check renal ultrasound to rule out ongoing urinary retention, outpatient urology follow-up, currently on Rocephin, follow culture and sensitivity data.  After hydration sepsis pathophysiology has resolved.  Chronic diastolic CHF.  Currently compensated.  Continue home medication Coreg and Norvasc.  Diuretics on hold due to AKI.  AKI.  Gentle hydration, renal ultrasound and monitor.  Dyslipidemia.  On Crestor.  Depression.  Continue combination of BuSpar and Celexa.  History of pulmonary hypertension with moderate TR.  Supportive care.  History of chronic pain.  On Vicodin, lidocaine patch along  with Neurontin combination.  Continue.    DM type II.  Sliding scale.  CBG (last 3)  Recent Labs    05/29/22 1618 05/29/22 2111 05/30/22 0817  GLUCAP 131* 214* 164*        Condition -   Guarded  Family Communication  : Andrea Kirk [phone]  on 05/30/2022 at 9:13 AM  Code Status :  Full  Consults  :  None  PUD Prophylaxis :    Procedures  :     [place] - No hydronephrosis.  No evidence of renal or perirenal abscess.  CT - 1. Findings concerning for UTI. Correlation with urinalysis recommended. 2. Colonic diverticulosis. No bowel obstruction. Normal appendix. 3. Cirrhosis. 4. Right lung base subsegmental atelectasis. Pneumonia is not excluded. 5.  Aortic Atherosclerosis.      Disposition Plan  :    Status is: Inpatient   DVT Prophylaxis  :    enoxaparin (LOVENOX) injection 40 mg Start: 05/28/22 0900    Lab Results  Component Value Date   PLT 131 (L) 05/30/2022    Diet :  Diet Order             Diet Carb Modified Fluid consistency: Thin; Room service appropriate? Yes with Assist  Diet effective now                    Inpatient Medications  Scheduled Meds:  amLODipine  10 mg Oral Daily   vitamin C  1,000 mg Oral Daily   aspirin EC  81 mg Oral QPM   busPIRone  7.5 mg Oral BID   carvedilol  12.5 mg Oral BID   citalopram  20 mg Oral Daily   cyanocobalamin  500 mcg Oral Daily   enoxaparin (LOVENOX) injection  40 mg Subcutaneous Q24H   gabapentin  100 mg Oral TID   insulin aspart  0-5 Units Subcutaneous QHS   insulin aspart  0-9 Units Subcutaneous TID WC   lidocaine  1 patch Transdermal Q24H   magnesium oxide  200 mg Oral Daily   oxybutynin  10 mg Oral Daily   polyvinyl alcohol  2 drop Both Eyes BID   rosuvastatin  20 mg Oral Daily   saccharomyces boulardii  250 mg Oral Daily   senna  2 tablet Oral QHS   sodium chloride flush  3 mL Intravenous Q12H   ticagrelor  90 mg Oral BID   Continuous Infusions:  cefTRIAXone (ROCEPHIN)  IV 2 g  (05/30/22 0139)   PRN Meds:.acetaminophen **OR** acetaminophen, alum & mag hydroxide-simeth, HYDROcodone-acetaminophen, ipratropium-albuterol, lactulose, methocarbamol, ondansetron **OR** ondansetron (ZOFRAN) IV    Objective:   Vitals:   05/29/22 1600 05/29/22 1812 05/30/22 0400 05/30/22 0800  BP: (!) 118/98  129/78 (!) 144/62  Pulse: 94 92 86 86  Resp: (!) 21 18 18 15   Temp:   97.9 F (36.6 C) 98 F (36.7 C)  TempSrc:   Oral Oral  SpO2: 91% 97% 97% 96%  Weight:      Height:        Wt Readings from Last 3 Encounters:  05/28/22 80 kg  05/21/22 80.3 kg  01/31/22 82.1 kg     Intake/Output Summary (Last 24 hours) at 05/30/2022 0907 Last data filed at 05/30/2022 0500 Gross per 24 hour  Intake 879.91 ml  Output 2300 ml  Net -1420.09 ml     Physical Exam  Awake Alert, No new F.N deficits, Normal affect [place].AT,PERRAL Supple Neck, No JVD,   Symmetrical Chest wall movement, Good air movement bilaterally, CTAB RRR,No Gallops,Rubs or new Murmurs,  +ve B.Sounds, Abd Soft, No tenderness,   No Cyanosis, Clubbing or edema        Data Review:    Recent Labs  Lab 05/28/22 0121 05/28/22 0824 05/29/22 0421 05/30/22 0347  WBC 8.3 8.6 7.1 6.6  HGB 9.4* 10.1* 9.3* 9.5*  HCT 29.9* 32.3* 29.6* 29.8*  PLT 121* 126*  117* 131*  MCV 107.2* 107.0* 106.5* 105.3*  MCH 33.7 33.4 33.5 33.6  MCHC 31.4 31.3 31.4 31.9  RDW 13.4 13.5 13.4 13.4  LYMPHSABS 0.6*  --   --  0.3*  MONOABS 0.7  --   --  0.4  EOSABS 0.1  --   --  0.0  BASOSABS 0.1  --   --  0.0    Recent Labs  Lab 05/28/22 0121 05/28/22 0130 05/28/22 0402 05/28/22 0824 05/28/22 1021 05/29/22 0421 05/30/22 0347  NA 134*  --   --   --   --  134* 138  K 3.8  --   --   --   --  3.7 3.7  CL 104  --   --   --   --  103 104  CO2 18*  --   --   --   --  23 24  ANIONGAP 12  --   --   --   --  8 10  GLUCOSE 144*  --   --   --   --  94 156*  BUN 21  --   --   --   --  17 15  CREATININE 1.03*  --   --  1.37*  --  1.30*  1.21*  AST 11*  --   --   --   --   --   --   ALT 10  --   --   --   --   --   --   ALKPHOS 51  --   --   --   --   --   --   BILITOT 1.0  --   --   --   --   --   --   ALBUMIN 2.0*  --   --   --   --   --   --   CRP  --   --   --  23.2*  --   --  29.1*  PROCALCITON  --   --   --   --  20.83  --  11.81  LATICACIDVEN 7.1*  --  0.8  --   --   --   --   INR  --  1.3*  --   --   --   --   --   HGBA1C  --   --   --  8.1*  --   --   --   BNP  --   --   --   --   --   --  231.7*  MG  --   --   --   --   --   --  2.1  CALCIUM 7.8*  --   --   --   --  8.2* 8.7*      Recent Labs  Lab 05/28/22 0121 05/28/22 0130 05/28/22 0402 05/28/22 0824 05/28/22 1021 05/29/22 0421 05/30/22 0347  CRP  --   --   --  23.2*  --   --  29.1*  PROCALCITON  --   --   --   --  20.83  --  11.81  LATICACIDVEN 7.1*  --  0.8  --   --   --   --   INR  --  1.3*  --   --   --   --   --   HGBA1C  --   --   --  8.1*  --   --   --  BNP  --   --   --   --   --   --  231.7*  MG  --   --   --   --   --   --  2.1  CALCIUM 7.8*  --   --   --   --  8.2* 8.7*   Lab Results  Component Value Date   HGBA1C 8.1 (H) 05/28/2022   Radiology Reports US RENAL  Result Date: 05/29/2022 CLINICAL DATA:  [id number] UTI (urinary tract infection) [id number] EXAM: RENAL / URINARY TRACT ULTRASOUND COMPLETE COMPARISON:  CT 05/28/2022 FINDINGS: Right Kidney: Renal measurements: 11.4 x 5.0 x 5.7 cm = volume: 170.9 mL. No hydronephrosis. No renal or perirenal fluid collection. No sonographically evident mass or nephrolithiasis. Left Kidney: Renal measurements: 11.2 x 6.0 x 5.7 cm = volume: 199.5 mL. No hydronephrosis. No renal or perirenal fluid collection. No sonographic evidence of nephrolithiasis. There is a 3.1 cm cystic lesion in the lower pole which appear simple in acquired no follow-up imaging. Bladder: Mildly distended but unremarkable. Other: None. IMPRESSION: No hydronephrosis.  No evidence of renal or perirenal abscess. Electronically Signed    By: Caprice Renshaw M.D.   On: 05/29/2022 18:28   DG CHEST PORT 1 VIEW  Result Date: 05/28/2022 CLINICAL DATA:  Pneumonia. EXAM: PORTABLE CHEST 1 VIEW COMPARISON:  May 28, 2022. FINDINGS: Stable cardiomegaly. Status post coronary bypass graft. Minimal right basilar subsegmental atelectasis is noted. Left lung is clear. Bony thorax is unremarkable. IMPRESSION: Minimal right basilar subsegmental atelectasis. Aortic Atherosclerosis (ICD10-I70.0). Electronically Signed   By: Lupita Raider M.D.   On: 05/28/2022 11:14   CT ABDOMEN PELVIS W CONTRAST  Result Date: 05/28/2022 CLINICAL DATA:  Abdominal pain. EXAM: CT ABDOMEN AND PELVIS WITH CONTRAST TECHNIQUE: Multidetector CT imaging of the abdomen and pelvis was performed using the standard protocol following bolus administration of intravenous contrast. RADIATION DOSE REDUCTION: This exam was performed according to the departmental dose-optimization program which includes automated exposure control, adjustment of the mA and/or kV according to patient size and/or use of iterative reconstruction technique. CONTRAST:  75mL OMNIPAQUE IOHEXOL 350 MG/ML SOLN COMPARISON:  CT abdomen pelvis dated 02/14/2021. FINDINGS: Lower chest: Right lung base subsegmental atelectasis. Pneumonia is not excluded. There is coronary vascular calcification. No intra-abdominal free air or free fluid. Hepatobiliary: Irregular liver contour suggestive of cirrhosis. Clinical correlation is recommended. No biliary dilatation. The gallbladder is unremarkable. Pancreas: Unremarkable. No pancreatic ductal dilatation or surrounding inflammatory changes. Spleen: Normal in size without focal abnormality. Adrenals/Urinary Tract: The adrenal glands are unremarkable. There is no hydronephrosis on either side. There is a 3 cm left renal inferior pole cyst as seen previously. No imaging follow-up. Mild bilateral perinephric stranding, nonspecific. There is apparent mild enhancement of the left ureteral  urothelium as well as mild thickened appearance of the bladder wall. Correlation with urinalysis recommended to exclude UTI. Stomach/Bowel: Several small scattered colonic diverticula without active inflammatory changes. There is moderate stool throughout the colon. There is no bowel obstruction or active inflammation. The appendix is normal. Vascular/Lymphatic: Moderate aortoiliac atherosclerotic disease. The IVC is unremarkable. No portal venous gas. There is no adenopathy. Reproductive: The uterus is grossly unremarkable. No adnexal masses. Other: None Musculoskeletal: Osteopenia with degenerative changes of the spine. Lower lumbar fusion. No acute osseous pathology. IMPRESSION: 1. Findings concerning for UTI. Correlation with urinalysis recommended. 2. Colonic diverticulosis. No bowel obstruction. Normal appendix. 3. Cirrhosis. 4. Right lung base subsegmental atelectasis. Pneumonia is not excluded. 5.  Aortic Atherosclerosis (ICD10-I70.0). Electronically Signed   By: Elgie Collard M.D.   On: 05/28/2022 03:08   DG Chest Port 1 View  Result Date: 05/28/2022 CLINICAL DATA:  Possible sepsis. EXAM: PORTABLE CHEST 1 VIEW COMPARISON:  02/13/2021. FINDINGS: Heart is enlarged and the mediastinal contour stable. There is atherosclerotic calcification of the aorta. Lung volumes are low with mild atelectasis at the lung bases. No effusion or pneumothorax. Sternotomy wires are present over the midline. No acute osseous abnormality. IMPRESSION: 1. Low lung volumes with atelectasis at the lung bases. 2. Cardiomegaly. Electronically Signed   By: Thornell Sartorius M.D.   On: 05/28/2022 02:25      Signature  -   Susa Raring M.D on 05/30/2022 at 9:07 AM   -  To page go to [email or web address]

## 2022-05-31 DIAGNOSIS — N390 Urinary tract infection, site not specified: Secondary | ICD-10-CM | POA: Diagnosis not present

## 2022-05-31 DIAGNOSIS — A419 Sepsis, unspecified organism: Secondary | ICD-10-CM | POA: Diagnosis not present

## 2022-05-31 LAB — BASIC METABOLIC PANEL
Anion gap: 10 (ref 5–15)
BUN: 14 mg/dL (ref 8–23)
CO2: 24 mmol/L (ref 22–32)
Calcium: 8.6 mg/dL — ABNORMAL LOW (ref 8.9–10.3)
Chloride: 100 mmol/L (ref 98–111)
Creatinine, Ser: 1.06 mg/dL — ABNORMAL HIGH (ref 0.44–1.00)
GFR, Estimated: 53 mL/min — ABNORMAL LOW (ref >60)
Glucose, Bld: 177 mg/dL — ABNORMAL HIGH (ref 70–99)
Potassium: 3.3 mmol/L — ABNORMAL LOW (ref 3.5–5.1)
Sodium: 134 mmol/L — ABNORMAL LOW (ref 135–145)

## 2022-05-31 LAB — URINE CULTURE: Culture: 60000 — AB

## 2022-05-31 LAB — MAGNESIUM: Magnesium: 1.9 mg/dL (ref 1.7–2.4)

## 2022-05-31 LAB — GLUCOSE, CAPILLARY
Glucose-Capillary: 177 mg/dL — ABNORMAL HIGH (ref 70–99)
Glucose-Capillary: 178 mg/dL — ABNORMAL HIGH (ref 70–99)
Glucose-Capillary: 192 mg/dL — ABNORMAL HIGH (ref 70–99)

## 2022-05-31 LAB — CULTURE, BLOOD (ROUTINE X 2)

## 2022-05-31 LAB — BRAIN NATRIURETIC PEPTIDE: B Natriuretic Peptide: 219 pg/mL — ABNORMAL HIGH (ref 0.0–100.0)

## 2022-05-31 MED ORDER — SALINE SPRAY 0.65 % NA SOLN
1.0000 | NASAL | Status: DC | PRN
  Administered 2022-05-31 (×2): 1 via NASAL
  Filled 2022-05-31: qty 44

## 2022-05-31 MED ORDER — LEVOFLOXACIN 500 MG PO TABS: 500.0000 mg | ORAL_TABLET | Freq: Every day | ORAL | 0 refills | Status: AC

## 2022-05-31 MED ORDER — POTASSIUM CHLORIDE CRYS ER 20 MEQ PO TBCR
40.0000 meq | EXTENDED_RELEASE_TABLET | Freq: Once | ORAL | Status: AC
  Administered 2022-05-31: 40 meq via ORAL
  Filled 2022-05-31: qty 2

## 2022-05-31 NOTE — Discharge Summary (Signed)
Andrea Kirk GNF:[id number] DOB: 12/06/1940 DOA: 05/28/2022  PCP: Georgann Housekeeper, MD  Admit date: 05/28/2022  Discharge date: 05/31/2022  Admitted From: SNF   Disposition:  SNF   Recommendations for Outpatient Follow-up:   Follow up with PCP in 1-2 weeks  PCP Please obtain BMP/CBC, 2 view CXR in 1week,  (see Discharge instructions)   PCP Please follow up on the following pending results: Needs outpatient urology follow-up within 7 to 10 days for recurrent UTIs.   Home Health: None   Equipment/Devices: None  Consultations: None  Discharge Condition: Stable    CODE STATUS: Full    Diet Recommendation: Heart Healthy Low Carb    Chief Complaint  Patient presents with   Fatigue     Brief history of present illness from the day of admission and additional interim summary     82 y.o. 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                                                                   Hospital Course   Sepsis due to UTI with E. coli bacteremia.  Patient has had multiple episodes of UTIs in the past according to her, monitor for any bladder retention, continue her home oxybutynin, check renal ultrasound to rule out ongoing urinary retention, outpatient urology follow-up, did with IV Rocephin here, clinically sepsis pathophysiology has resolved, no fever or leukocytosis, 7 more days of oral Levaquin then stop.   Chronic diastolic CHF.  Currently compensated.  Continue home  medication Coreg and Norvasc resume home dose diuretics upon discharge.   AKI.  Solved after hydration with IV fluids.   Dyslipidemia.  On Crestor.   Depression.  Continue combination of BuSpar and Celexa.   History of pulmonary hypertension with moderate TR.  Supportive care.   History of chronic pain.  On Vicodin, lidocaine patch along with Neurontin combination.  Continue previous dose.   DM type II.  Continue low carbohydrate diet, continue previous regimen.  Check CBGs q. ACH S.  Discharge diagnosis     Principal Problem:   Sepsis secondary to UTI [facility])    Discharge instructions    Discharge Instructions     Discharge instructions   Complete by: As directed    Follow with Primary MD Georgann Housekeeper, MD in 7 days   Get CBC, CMP, Magnesium -  checked next visit with your primary MD or SNF MD   Activity: As tolerated with Full  fall precautions use walker/cane & assistance as needed  Disposition SNF  Diet: Heart Healthy Low Carb, check CBGs q. ACH S.  Special Instructions: If you have smoked or chewed Tobacco  in the last 2 yrs please stop smoking, stop any regular Alcohol  and or any Recreational drug use.  On your next visit with your primary care physician please Get Medicines reviewed and adjusted.  Please request your Prim.MD to go over all Hospital Tests and Procedure/Radiological results at the follow up, please get all Hospital records sent to your Prim MD by signing hospital release before you go home.  If you experience worsening of your admission symptoms, develop shortness of breath, life threatening emergency, suicidal or homicidal thoughts you must seek medical attention immediately by calling 911 or calling your MD immediately  if symptoms less severe.  You Must read complete instructions/literature along with all the possible adverse reactions/side effects for all the Medicines you take and that have been prescribed to you. Take any new Medicines after  you have completely understood and accpet all the possible adverse reactions/side effects.   Increase activity slowly   Complete by: As directed        Discharge Medications   Allergies as of 05/31/2022       Reactions   Codeine Other (See Comments)   Abnormal behavior   Latex Rash, Other (See Comments)   tears skin   Morphine And Codeine Other (See Comments)   Affects BP and blood sugar.   Cyclobenzaprine    MADE PT SICK- pt unsure if med was cyclobenzaprine or methocarbamol    Methocarbamol Other (See Comments)   MADE PT SICK- pt unsure if med was cyclobenzaprine or methocarbamol  11/09/20 pt currently takes methocarbamol        Medication List     TAKE these medications    acetaminophen 500 MG tablet Commonly known as: TYLENOL Take 1,000 mg by mouth at bedtime.   amLODipine 10 MG tablet Commonly known as: NORVASC Take 10 mg by mouth daily.   Aspercreme Lidocaine 4 % Generic drug: lidocaine Place 1 patch onto the skin daily.   lidocaine 5 % Commonly known as: LIDODERM Place 1 patch onto the skin daily. Remove & Discard patch within 12 hours or as directed by MD   aspirin EC 81 MG tablet Take 81 mg by mouth every evening.   busPIRone 7.5 MG tablet Commonly known as: BUSPAR Take 7.5 mg by mouth 2 (two) times daily.   calcium carbonate 750 MG chewable tablet Commonly known as: TUMS EX Chew 2 tablets by mouth daily as needed for heartburn (with food).   carvedilol 12.5 MG tablet Commonly known as: COREG Take 1 tablet (12.5 mg total) by mouth 2 (two) times daily.   Chloraseptic 6-10 MG lozenge Generic drug: benzocaine-menthol Take 1 lozenge by mouth as needed for sore throat (every 2 hours).   citalopram 20 MG tablet Commonly known as: CELEXA Take 20 mg by mouth daily.   docusate sodium 100 MG capsule Commonly known as: COLACE Take 100 mg by mouth 2 (two) times daily.   ferrous sulfate 325 (65 FE) MG tablet Take 325 mg by mouth daily.  Monday-Friday   furosemide 20 MG tablet Commonly known as: LASIX Take 20 mg by mouth daily as needed for edema.   gabapentin 100 MG capsule Commonly known as: NEURONTIN Take 1 capsule (100 mg total) by mouth 3 (three) times daily.   glipiZIDE 5 MG 24 hr tablet Commonly  known as: GLUCOTROL XL Take 1 tablet (5 mg total) by mouth daily with breakfast.   HYDROcodone-acetaminophen 5-325 MG tablet Commonly known as: NORCO/VICODIN Take 1 tablet by mouth daily. At 1pm   HYDROcodone-acetaminophen 10-325 MG tablet Commonly known as: NORCO Take 1 tablet by mouth in the morning and at bedtime.   ipratropium-albuterol 0.5-2.5 (3) MG/3ML Soln Commonly known as: DUONEB Take 3 mLs by nebulization every 6 (six) hours as needed (shortness of breath).   Jardiance 25 MG Tabs tablet Generic drug: empagliflozin Take 25 mg by mouth daily.   Lactulose 20 GM/30ML Soln Take 30 mLs by mouth daily as needed (constipation).   levofloxacin 500 MG tablet Commonly known as: Levaquin Take 1 tablet (500 mg total) by mouth daily for 10 days.   Magnesium Oxide 250 MG Tabs Take 250 mg by mouth daily.   melatonin 3 MG Tabs tablet Take 3 mg by mouth at bedtime.   methocarbamol 500 MG tablet Commonly known as: ROBAXIN Take 1 tablet (500 mg total) by mouth every 8 (eight) hours as needed for muscle spasms. What changed: when to take this   nitroGLYCERIN 0.4 MG SL tablet Commonly known as: NITROSTAT Place 1 tablet (0.4 mg total) under the tongue every 5 (five) minutes as needed. Chest pain. What changed:  reasons to take this additional instructions   oxybutynin 10 MG 24 hr tablet Commonly known as: DITROPAN-XL Take 10 mg by mouth daily.   pantoprazole 40 MG tablet Commonly known as: PROTONIX Take 40 mg by mouth daily.   rosuvastatin 20 MG tablet Commonly known as: CRESTOR Take 1 tablet (20 mg total) by mouth daily.   saccharomyces boulardii 250 MG capsule Commonly known as: FLORASTOR Take  250 mg by mouth daily.   senna 8.6 MG tablet Commonly known as: SENOKOT Take 2 tablets by mouth at bedtime.   Systane 0.4-0.3 % Soln Generic drug: Polyethyl Glycol-Propyl Glycol Place 2 drops into both eyes in the morning, at noon, and at bedtime.   ticagrelor 90 MG Tabs tablet Commonly known as: BRILINTA Take 1 tablet (90 mg total) by mouth 2 (two) times daily.   valsartan 80 MG tablet Commonly known as: DIOVAN Take 80 mg by mouth daily.   vitamin B-12 500 MCG tablet Commonly known as: CYANOCOBALAMIN Take 500 mcg by mouth daily. Monday-Friday   vitamin C 1000 MG tablet Take 1,000 mg by mouth daily.         Follow-up Information     Georgann Housekeeper, MD. Schedule an appointment as soon as possible for a visit in 1 week(s).   Specialty: Internal Medicine Contact information: [address] [place] [address] [phone]         Loletta Parish., MD. Schedule an appointment as soon as possible for a visit in 1 week(s).   Specialty: Urology Contact information: [address] [place] [address] [phone]                 Major procedures and Radiology Reports - PLEASE review detailed and final reports thoroughly  -     US RENAL  Result Date: 05/29/2022 CLINICAL DATA:  [id number] UTI (urinary tract infection) [id number] EXAM: RENAL / URINARY TRACT ULTRASOUND COMPLETE COMPARISON:  CT 05/28/2022 FINDINGS: Right Kidney: Renal measurements: 11.4 x 5.0 x 5.7 cm = volume: 170.9 mL. No hydronephrosis. No renal or perirenal fluid collection. No sonographically evident mass or nephrolithiasis. Left Kidney: Renal measurements: 11.2 x 6.0 x 5.7 cm =  volume: 199.5 mL. No hydronephrosis. No renal or perirenal fluid collection. No sonographic evidence of nephrolithiasis. There is a 3.1 cm cystic lesion in the lower pole which appear simple in acquired no follow-up imaging. Bladder: Mildly distended but unremarkable. Other: None. IMPRESSION: No  hydronephrosis.  No evidence of renal or perirenal abscess. Electronically Signed   By: Caprice Renshaw M.D.   On: 05/29/2022 18:28   DG CHEST PORT 1 VIEW  Result Date: 05/28/2022 CLINICAL DATA:  Pneumonia. EXAM: PORTABLE CHEST 1 VIEW COMPARISON:  May 28, 2022. FINDINGS: Stable cardiomegaly. Status post coronary bypass graft. Minimal right basilar subsegmental atelectasis is noted. Left lung is clear. Bony thorax is unremarkable. IMPRESSION: Minimal right basilar subsegmental atelectasis. Aortic Atherosclerosis (ICD10-I70.0). Electronically Signed   By: Lupita Raider M.D.   On: 05/28/2022 11:14   CT ABDOMEN PELVIS W CONTRAST  Result Date: 05/28/2022 CLINICAL DATA:  Abdominal pain. EXAM: CT ABDOMEN AND PELVIS WITH CONTRAST TECHNIQUE: Multidetector CT imaging of the abdomen and pelvis was performed using the standard protocol following bolus administration of intravenous contrast. RADIATION DOSE REDUCTION: This exam was performed according to the departmental dose-optimization program which includes automated exposure control, adjustment of the mA and/or kV according to patient size and/or use of iterative reconstruction technique. CONTRAST:  75mL OMNIPAQUE IOHEXOL 350 MG/ML SOLN COMPARISON:  CT abdomen pelvis dated 02/14/2021. FINDINGS: Lower chest: Right lung base subsegmental atelectasis. Pneumonia is not excluded. There is coronary vascular calcification. No intra-abdominal free air or free fluid. Hepatobiliary: Irregular liver contour suggestive of cirrhosis. Clinical correlation is recommended. No biliary dilatation. The gallbladder is unremarkable. Pancreas: Unremarkable. No pancreatic ductal dilatation or surrounding inflammatory changes. Spleen: Normal in size without focal abnormality. Adrenals/Urinary Tract: The adrenal glands are unremarkable. There is no hydronephrosis on either side. There is a 3 cm left renal inferior pole cyst as seen previously. No imaging follow-up. Mild bilateral perinephric  stranding, nonspecific. There is apparent mild enhancement of the left ureteral urothelium as well as mild thickened appearance of the bladder wall. Correlation with urinalysis recommended to exclude UTI. Stomach/Bowel: Several small scattered colonic diverticula without active inflammatory changes. There is moderate stool throughout the colon. There is no bowel obstruction or active inflammation. The appendix is normal. Vascular/Lymphatic: Moderate aortoiliac atherosclerotic disease. The IVC is unremarkable. No portal venous gas. There is no adenopathy. Reproductive: The uterus is grossly unremarkable. No adnexal masses. Other: None Musculoskeletal: Osteopenia with degenerative changes of the spine. Lower lumbar fusion. No acute osseous pathology. IMPRESSION: 1. Findings concerning for UTI. Correlation with urinalysis recommended. 2. Colonic diverticulosis. No bowel obstruction. Normal appendix. 3. Cirrhosis. 4. Right lung base subsegmental atelectasis. Pneumonia is not excluded. 5.  Aortic Atherosclerosis (ICD10-I70.0). Electronically Signed   By: Elgie Collard M.D.   On: 05/28/2022 03:08   DG Chest Port 1 View  Result Date: 05/28/2022 CLINICAL DATA:  Possible sepsis. EXAM: PORTABLE CHEST 1 VIEW COMPARISON:  02/13/2021. FINDINGS: Heart is enlarged and the mediastinal contour stable. There is atherosclerotic calcification of the aorta. Lung volumes are low with mild atelectasis at the lung bases. No effusion or pneumothorax. Sternotomy wires are present over the midline. No acute osseous abnormality. IMPRESSION: 1. Low lung volumes with atelectasis at the lung bases. 2. Cardiomegaly. Electronically Signed   By: Thornell Sartorius M.D.   On: 05/28/2022 02:25    Micro Results    Recent Results (from the past 240 hour(s))  Blood Culture (routine x 2)     Status: None (Preliminary result)  Collection Time: 05/28/22  1:15 AM   Specimen: BLOOD  Result Value Ref Range Status   Specimen Description BLOOD RIGHT  ANTECUBITAL  Final   Special Requests   Final    BOTTLES DRAWN AEROBIC AND ANAEROBIC Blood Culture results may not be optimal due to an inadequate volume of blood received in culture bottles   Culture   Final    NO GROWTH 3 DAYS Performed at [facility], [address], [place], [place] [address]    Report Status PENDING  Incomplete  Blood Culture (routine x 2)     Status: Abnormal   Collection Time: 05/28/22  1:30 AM   Specimen: BLOOD RIGHT FOREARM  Result Value Ref Range Status   Specimen Description BLOOD RIGHT FOREARM  Final   Special Requests   Final    BOTTLES DRAWN AEROBIC AND ANAEROBIC Blood Culture results may not be optimal due to an excessive volume of blood received in culture bottles   Culture  Setup Time   Final    GRAM NEGATIVE RODS IN BOTH AEROBIC AND ANAEROBIC BOTTLES CRITICAL RESULT CALLED TO, READ BACK BY AND VERIFIED WITH: PHARMD CARLA JARDIN ON 05/28/22 @ 1623 BY DRT Performed at [facility], [address], [place], [place] [address]    Culture ESCHERICHIA COLI (A)  Final   Report Status 05/30/2022 FINAL  Final   Organism ID, Bacteria ESCHERICHIA COLI  Final      Susceptibility   Escherichia coli - MIC*    AMPICILLIN >=32 RESISTANT Resistant     CEFEPIME <=0.12 SENSITIVE Sensitive     CEFTAZIDIME <=1 SENSITIVE Sensitive     CIPROFLOXACIN <=0.25 SENSITIVE Sensitive     GENTAMICIN <=1 SENSITIVE Sensitive     IMIPENEM <=0.25 SENSITIVE Sensitive     TRIMETH/SULFA >=320 RESISTANT Resistant     AMPICILLIN/SULBACTAM 16 INTERMEDIATE Intermediate     PIP/TAZO <=4 SENSITIVE Sensitive     * ESCHERICHIA COLI  Blood Culture ID Panel (Reflexed)     Status: Abnormal   Collection Time: 05/28/22  1:30 AM  Result Value Ref Range Status   Enterococcus faecalis NOT DETECTED NOT DETECTED Final   Enterococcus Faecium NOT DETECTED NOT DETECTED Final   Listeria monocytogenes NOT DETECTED NOT DETECTED Final   Staphylococcus species NOT DETECTED NOT DETECTED Final    Staphylococcus aureus (BCID) NOT DETECTED NOT DETECTED Final   Staphylococcus epidermidis NOT DETECTED NOT DETECTED Final   Staphylococcus lugdunensis NOT DETECTED NOT DETECTED Final   Streptococcus species NOT DETECTED NOT DETECTED Final   Streptococcus agalactiae NOT DETECTED NOT DETECTED Final   Streptococcus pneumoniae NOT DETECTED NOT DETECTED Final   Streptococcus pyogenes NOT DETECTED NOT DETECTED Final   A.calcoaceticus-baumannii NOT DETECTED NOT DETECTED Final   Bacteroides fragilis NOT DETECTED NOT DETECTED Final   Enterobacterales DETECTED (A) NOT DETECTED Final    Comment: Enterobacterales represent a large order of gram negative bacteria, not a single organism. CRITICAL RESULT CALLED TO, READ BACK BY AND VERIFIED WITH: PHARMD CARLA JARDIN ON 05/28/22 @ 1623 BY DRT    Enterobacter cloacae complex NOT DETECTED NOT DETECTED Final   Escherichia coli DETECTED (A) NOT DETECTED Final    Comment: CRITICAL RESULT CALLED TO, READ BACK BY AND VERIFIED WITH: PHARMD CARLA JARDIN ON 05/28/22 @ 1623 BY DRT    Klebsiella aerogenes NOT DETECTED NOT DETECTED Final   Klebsiella oxytoca NOT DETECTED NOT DETECTED Final   Klebsiella pneumoniae NOT DETECTED NOT DETECTED Final   Proteus species NOT  DETECTED NOT DETECTED Final   Salmonella species NOT DETECTED NOT DETECTED Final   Serratia marcescens NOT DETECTED NOT DETECTED Final   Haemophilus influenzae NOT DETECTED NOT DETECTED Final   Neisseria meningitidis NOT DETECTED NOT DETECTED Final   Pseudomonas aeruginosa NOT DETECTED NOT DETECTED Final   Stenotrophomonas maltophilia NOT DETECTED NOT DETECTED Final   Candida albicans NOT DETECTED NOT DETECTED Final   Candida auris NOT DETECTED NOT DETECTED Final   Candida glabrata NOT DETECTED NOT DETECTED Final   Candida krusei NOT DETECTED NOT DETECTED Final   Candida parapsilosis NOT DETECTED NOT DETECTED Final   Candida tropicalis NOT DETECTED NOT DETECTED Final   Cryptococcus  neoformans/gattii NOT DETECTED NOT DETECTED Final   CTX-M ESBL NOT DETECTED NOT DETECTED Final   Carbapenem resistance IMP NOT DETECTED NOT DETECTED Final   Carbapenem resistance KPC NOT DETECTED NOT DETECTED Final   Carbapenem resistance NDM NOT DETECTED NOT DETECTED Final   Carbapenem resist OXA 48 LIKE NOT DETECTED NOT DETECTED Final   Carbapenem resistance VIM NOT DETECTED NOT DETECTED Final    Comment: Performed at [facility], [address] [place], [place] [address]  Resp panel by RT-PCR (RSV, Flu A&B, Covid) Anterior Nasal Swab     Status: None   Collection Time: 05/28/22  2:38 AM   Specimen: Anterior Nasal Swab  Result Value Ref Range Status   SARS Coronavirus 2 by RT PCR NEGATIVE NEGATIVE Final   Influenza A by PCR NEGATIVE NEGATIVE Final   Influenza B by PCR NEGATIVE NEGATIVE Final    Comment: (NOTE) The Xpert Xpress SARS-CoV-2/FLU/RSV plus assay is intended as an aid in the diagnosis of influenza from Nasopharyngeal swab specimens and should not be used as a sole basis for treatment. Nasal washings and aspirates are unacceptable for Xpert Xpress SARS-CoV-2/FLU/RSV testing.  Fact Sheet for Patients: BloggerCourse.com  Fact Sheet for Healthcare Providers: SeriousBroker.it  This test is not yet approved or cleared by the [place] FDA and has been authorized for detection and/or diagnosis of SARS-CoV-2 by FDA under an Emergency Use Authorization (EUA). This EUA will remain in effect (meaning this test can be used) for the duration of the COVID-19 declaration under Section 564(b)(1) of the Act, 21 U.S.C. section 360bbb-3(b)(1), unless the authorization is terminated or revoked.     Resp Syncytial Virus by PCR NEGATIVE NEGATIVE Final    Comment: (NOTE) Fact Sheet for Patients: BloggerCourse.com  Fact Sheet for Healthcare Providers: SeriousBroker.it  This  test is not yet approved or cleared by the [place] FDA and has been authorized for detection and/or diagnosis of SARS-CoV-2 by FDA under an Emergency Use Authorization (EUA). This EUA will remain in effect (meaning this test can be used) for the duration of the COVID-19 declaration under Section 564(b)(1) of the Act, 21 U.S.C. section 360bbb-3(b)(1), unless the authorization is terminated or revoked.  Performed at [facility], [address], [place], [place] [address]   Urine Culture     Status: Abnormal   Collection Time: 05/28/22  3:12 AM   Specimen: Urine, Random  Result Value Ref Range Status   Specimen Description URINE, RANDOM  Final   Special Requests   Final    NONE Reflexed from [id number] Performed at [facility], [address] [place], [place] [address]    Culture (A)  Final    60,000 COLONIES/mL ENTEROCOCCUS FAECALIS VANCOMYCIN RESISTANT ENTEROCOCCUS 10,000 COLONIES/mL ESCHERICHIA COLI    Report Status 05/31/2022 FINAL  Final   Organism ID, Bacteria ENTEROCOCCUS FAECALIS (A)  Final   Organism ID, Bacteria ESCHERICHIA COLI (A)  Final      Susceptibility   Escherichia coli - MIC*    AMPICILLIN >=32 RESISTANT Resistant     CEFAZOLIN <=4 SENSITIVE Sensitive     CEFEPIME <=0.12 SENSITIVE Sensitive     CEFTRIAXONE <=0.25 SENSITIVE Sensitive     CIPROFLOXACIN <=0.25 SENSITIVE Sensitive     GENTAMICIN <=1 SENSITIVE Sensitive     IMIPENEM <=0.25 SENSITIVE Sensitive     NITROFURANTOIN <=16 SENSITIVE Sensitive     TRIMETH/SULFA >=320 RESISTANT Resistant     AMPICILLIN/SULBACTAM 16 INTERMEDIATE Intermediate     PIP/TAZO <=4 SENSITIVE Sensitive     * 10,000 COLONIES/mL ESCHERICHIA COLI   Enterococcus faecalis - MIC*    AMPICILLIN <=2 SENSITIVE Sensitive     NITROFURANTOIN <=16 SENSITIVE Sensitive     VANCOMYCIN >=32 RESISTANT Resistant     * 60,000 COLONIES/mL ENTEROCOCCUS FAECALIS  Respiratory (~20 pathogens) panel by PCR     Status: None    Collection Time: 05/28/22  8:02 AM   Specimen: Nasopharyngeal Swab; Respiratory  Result Value Ref Range Status   Adenovirus NOT DETECTED NOT DETECTED Final   Coronavirus 229E NOT DETECTED NOT DETECTED Final    Comment: (NOTE) The Coronavirus on the Respiratory Panel, DOES NOT test for the novel  Coronavirus (2019 nCoV)    Coronavirus HKU1 NOT DETECTED NOT DETECTED Final   Coronavirus NL63 NOT DETECTED NOT DETECTED Final   Coronavirus OC43 NOT DETECTED NOT DETECTED Final   Metapneumovirus NOT DETECTED NOT DETECTED Final   Rhinovirus / Enterovirus NOT DETECTED NOT DETECTED Final   Influenza A NOT DETECTED NOT DETECTED Final   Influenza B NOT DETECTED NOT DETECTED Final   Parainfluenza Virus 1 NOT DETECTED NOT DETECTED Final   Parainfluenza Virus 2 NOT DETECTED NOT DETECTED Final   Parainfluenza Virus 3 NOT DETECTED NOT DETECTED Final   Parainfluenza Virus 4 NOT DETECTED NOT DETECTED Final   Respiratory Syncytial Virus NOT DETECTED NOT DETECTED Final   Bordetella pertussis NOT DETECTED NOT DETECTED Final   Bordetella Parapertussis NOT DETECTED NOT DETECTED Final   Chlamydophila pneumoniae NOT DETECTED NOT DETECTED Final   Mycoplasma pneumoniae NOT DETECTED NOT DETECTED Final    Comment: Performed at [facility], [address] [place], [place] [address]  Group A Strep by PCR     Status: None   Collection Time: 05/28/22  8:04 AM   Specimen: Throat; Sterile Swab  Result Value Ref Range Status   Group A Strep by PCR NOT DETECTED NOT DETECTED Final    Comment: Performed at [facility], [address] [place], [place] [address]  Group B strep by PCR     Status: None   Collection Time: 05/28/22  8:04 AM   Specimen: Throat; Genital  Result Value Ref Range Status   Group B strep by PCR NEGATIVE NEGATIVE Final    Comment: (NOTE) Intrapartum testing with Xpert GBS assay should be used as an adjunct to other methods available and not used to replace antepartum testing (at  35-[redacted] weeks gestation). Performed at [facility], [address] [place], [place] [address]     Today   Subjective    Britini [name] today has no headache,no chest abdominal pain,no new weakness tingling or numbness, feels much better    Objective   Blood pressure 122/74, pulse 83, temperature 98.6 F (37 C), temperature source  Oral, resp. rate 20, height 5\' 5"  (1.651 m), weight 80 kg, SpO2 96 %.   Intake/Output Summary (Last 24 hours) at 05/31/2022 0946 Last data filed at 05/31/2022 0944 Gross per 24 hour  Intake 243 ml  Output 2752 ml  Net -2509 ml    Exam  Awake Alert, No new F.N deficits,    [place].AT,PERRAL Supple Neck,   Symmetrical Chest wall movement, Good air movement bilaterally, CTAB RRR,No Gallops,   +ve B.Sounds, Abd Soft, Non tender,  No Cyanosis, Clubbing or edema    Data Review   Recent Labs  Lab 05/28/22 0121 05/28/22 0824 05/29/22 0421 05/30/22 0347  WBC 8.3 8.6 7.1 6.6  HGB 9.4* 10.1* 9.3* 9.5*  HCT 29.9* 32.3* 29.6* 29.8*  PLT 121* 126* 117* 131*  MCV 107.2* 107.0* 106.5* 105.3*  MCH 33.7 33.4 33.5 33.6  MCHC 31.4 31.3 31.4 31.9  RDW 13.4 13.5 13.4 13.4  LYMPHSABS 0.6*  --   --  0.3*  MONOABS 0.7  --   --  0.4  EOSABS 0.1  --   --  0.0  BASOSABS 0.1  --   --  0.0    Recent Labs  Lab 05/28/22 0121 05/28/22 0130 05/28/22 0402 05/28/22 0824 05/28/22 1021 05/29/22 0421 05/30/22 0347 05/31/22 0419  NA 134*  --   --   --   --  134* 138 134*  K 3.8  --   --   --   --  3.7 3.7 3.3*  CL 104  --   --   --   --  103 104 100  CO2 18*  --   --   --   --  23 24 24   ANIONGAP 12  --   --   --   --  8 10 10   GLUCOSE 144*  --   --   --   --  94 156* 177*  BUN 21  --   --   --   --  17 15 14   CREATININE 1.03*  --   --  1.37*  --  1.30* 1.21* 1.06*  AST 11*  --   --   --   --   --   --   --   ALT 10  --   --   --   --   --   --   --   ALKPHOS 51  --   --   --   --   --   --   --   BILITOT 1.0  --   --   --   --   --   --   --   ALBUMIN  2.0*  --   --   --   --   --   --   --   CRP  --   --   --  23.2*  --   --  29.1*  --   PROCALCITON  --   --   --   --  20.83  --  11.81  --   LATICACIDVEN 7.1*  --  0.8  --   --   --   --   --   INR  --  1.3*  --   --   --   --   --   --   HGBA1C  --   --   --  8.1*  --   --   --   --   BNP  --   --   --   --   --   --  231.7* 219.0*  MG  --   --   --   --   --   --  2.1 1.9  CALCIUM 7.8*  --   --   --   --  8.2* 8.7* 8.6*    Total Time in preparing paper work, data evaluation and todays exam - 35 minutes  Signature  -    Susa Raring M.D on 05/31/2022 at 9:46 AM   -  To page go to [email or web address]

## 2022-05-31 NOTE — Discharge Instructions (Signed)
Do not drive, operate heavy machinery, perform activities at heights, swimming or participation in water activities or provide baby sitting services until you have seen by Primary MD or a Neurologist and advised to do so again.  Follow with Primary MD Georgann Housekeeper, MD in 7 days   Get CBC, CMP, Magnesium -  checked next visit with your primary MD or SNF MD   Activity: As tolerated with Full fall precautions use walker/cane & assistance as needed  Disposition SNF  Diet: Heart Healthy Low Carb, check CBGs q. ACH S.  Special Instructions: If you have smoked or chewed Tobacco  in the last 2 yrs please stop smoking, stop any regular Alcohol  and or any Recreational drug use.  On your next visit with your primary care physician please Get Medicines reviewed and adjusted.  Please request your Prim.MD to go over all Hospital Tests and Procedure/Radiological results at the follow up, please get all Hospital records sent to your Prim MD by signing hospital release before you go home.  If you experience worsening of your admission symptoms, develop shortness of breath, life threatening emergency, suicidal or homicidal thoughts you must seek medical attention immediately by calling 911 or calling your MD immediately  if symptoms less severe.  You Must read complete instructions/literature along with all the possible adverse reactions/side effects for all the Medicines you take and that have been prescribed to you. Take any new Medicines after you have completely understood and accpet all the possible adverse reactions/side effects.

## 2022-05-31 NOTE — TOC Transition Note (Signed)
Transition of Care [facility]) - CM/SW Discharge Note   Patient Details  Name: Andrea Kirk MRN: [id number] Date of Birth: Mar 01, 1940  Transition of Care [facility]) CM/SW Contact:  Mearl Latin, LCSW Phone Number: 05/31/2022, 2:11 PM   Clinical Narrative:    Patient will DC to: [facility] Anticipated DC date: 05/31/22  Family notified:Brother, Dr. Grills Transport by: Sharin Mons   Per MD patient ready for DC to [facility]. RN to call report prior to discharge [phone] room [phone]). RN, patient, patient's family, and facility notified of DC. Discharge Summary and FL2 sent to facility. DC packet on chart. Ambulance transport requested for patient.   CSW will sign off for now as social work intervention is no longer needed. Please consult [place] again if new needs arise.     Final next level of care: Skilled Nursing Facility Barriers to Discharge: Barriers Resolved   Patient Goals and CMS Choice CMS Medicare.gov Compare Post Acute Care list provided to:: Patient Represenative (must comment) Choice offered to / list presented to : Sibling  Discharge Placement     Existing PASRR number confirmed : 05/31/22          Patient [facility] bed at: [facility] Patient to be transferred to facility by: [facility] Name of family member notified: Brother Patient and family notified of of transfer: 05/31/22  Discharge Plan and Services Additional resources added to the After Visit Summary for   In-house Referral: Clinical Social Work   Post Acute Care Choice: Skilled Nursing Facility                               Social Determinants of Health (SDOH) Interventions SDOH Screenings   Tobacco Use: Low Risk  (05/28/2022)     Readmission Risk Interventions     No data to display

## 2022-05-31 NOTE — Care Management Important Message (Signed)
Important Message  Patient Details  Name: Andrea Kirk MRN: [id number] Date of Birth: 03-09-1940   Medicare Important Message Given:  Yes     Kirke Breach Stefan Church 05/31/2022, 3:40 PM

## 2022-06-01 DIAGNOSIS — G894 Chronic pain syndrome: Secondary | ICD-10-CM | POA: Diagnosis not present

## 2022-06-01 DIAGNOSIS — I129 Hypertensive chronic kidney disease with stage 1 through stage 4 chronic kidney disease, or unspecified chronic kidney disease: Secondary | ICD-10-CM | POA: Diagnosis not present

## 2022-06-01 DIAGNOSIS — K5900 Constipation, unspecified: Secondary | ICD-10-CM | POA: Diagnosis not present

## 2022-06-01 DIAGNOSIS — G629 Polyneuropathy, unspecified: Secondary | ICD-10-CM | POA: Diagnosis not present

## 2022-06-01 DIAGNOSIS — F32A Depression, unspecified: Secondary | ICD-10-CM | POA: Diagnosis not present

## 2022-06-01 DIAGNOSIS — I5032 Chronic diastolic (congestive) heart failure: Secondary | ICD-10-CM | POA: Diagnosis not present

## 2022-06-01 DIAGNOSIS — D333 Benign neoplasm of cranial nerves: Secondary | ICD-10-CM | POA: Diagnosis not present

## 2022-06-01 DIAGNOSIS — K219 Gastro-esophageal reflux disease without esophagitis: Secondary | ICD-10-CM | POA: Diagnosis not present

## 2022-06-04 DIAGNOSIS — G894 Chronic pain syndrome: Secondary | ICD-10-CM | POA: Diagnosis not present

## 2022-06-04 DIAGNOSIS — N390 Urinary tract infection, site not specified: Secondary | ICD-10-CM | POA: Diagnosis not present

## 2022-06-04 DIAGNOSIS — I5032 Chronic diastolic (congestive) heart failure: Secondary | ICD-10-CM | POA: Diagnosis not present

## 2022-06-04 DIAGNOSIS — I2510 Atherosclerotic heart disease of native coronary artery without angina pectoris: Secondary | ICD-10-CM | POA: Diagnosis not present

## 2022-06-04 DIAGNOSIS — D649 Anemia, unspecified: Secondary | ICD-10-CM | POA: Diagnosis not present

## 2022-06-04 DIAGNOSIS — I639 Cerebral infarction, unspecified: Secondary | ICD-10-CM | POA: Diagnosis not present

## 2022-06-04 DIAGNOSIS — E785 Hyperlipidemia, unspecified: Secondary | ICD-10-CM | POA: Diagnosis not present

## 2022-06-04 DIAGNOSIS — E119 Type 2 diabetes mellitus without complications: Secondary | ICD-10-CM | POA: Diagnosis not present

## 2022-06-04 DIAGNOSIS — G629 Polyneuropathy, unspecified: Secondary | ICD-10-CM | POA: Diagnosis not present

## 2022-06-04 DIAGNOSIS — F5101 Primary insomnia: Secondary | ICD-10-CM | POA: Diagnosis not present

## 2022-06-04 DIAGNOSIS — I129 Hypertensive chronic kidney disease with stage 1 through stage 4 chronic kidney disease, or unspecified chronic kidney disease: Secondary | ICD-10-CM | POA: Diagnosis not present

## 2022-06-04 DIAGNOSIS — K219 Gastro-esophageal reflux disease without esophagitis: Secondary | ICD-10-CM | POA: Diagnosis not present

## 2022-06-04 DIAGNOSIS — N3281 Overactive bladder: Secondary | ICD-10-CM | POA: Diagnosis not present

## 2022-06-05 DIAGNOSIS — I10 Essential (primary) hypertension: Secondary | ICD-10-CM | POA: Diagnosis not present

## 2022-06-06 DIAGNOSIS — G629 Polyneuropathy, unspecified: Secondary | ICD-10-CM | POA: Diagnosis not present

## 2022-06-06 DIAGNOSIS — I5032 Chronic diastolic (congestive) heart failure: Secondary | ICD-10-CM | POA: Diagnosis not present

## 2022-06-06 DIAGNOSIS — I129 Hypertensive chronic kidney disease with stage 1 through stage 4 chronic kidney disease, or unspecified chronic kidney disease: Secondary | ICD-10-CM | POA: Diagnosis not present

## 2022-06-06 DIAGNOSIS — G894 Chronic pain syndrome: Secondary | ICD-10-CM | POA: Diagnosis not present

## 2022-06-07 DIAGNOSIS — I639 Cerebral infarction, unspecified: Secondary | ICD-10-CM | POA: Diagnosis not present

## 2022-06-07 DIAGNOSIS — E1122 Type 2 diabetes mellitus with diabetic chronic kidney disease: Secondary | ICD-10-CM | POA: Diagnosis not present

## 2022-06-07 DIAGNOSIS — I2510 Atherosclerotic heart disease of native coronary artery without angina pectoris: Secondary | ICD-10-CM | POA: Diagnosis not present

## 2022-06-07 DIAGNOSIS — I5030 Unspecified diastolic (congestive) heart failure: Secondary | ICD-10-CM | POA: Diagnosis not present

## 2022-06-11 DIAGNOSIS — I5032 Chronic diastolic (congestive) heart failure: Secondary | ICD-10-CM | POA: Diagnosis not present

## 2022-06-11 DIAGNOSIS — I129 Hypertensive chronic kidney disease with stage 1 through stage 4 chronic kidney disease, or unspecified chronic kidney disease: Secondary | ICD-10-CM | POA: Diagnosis not present

## 2022-06-11 DIAGNOSIS — G894 Chronic pain syndrome: Secondary | ICD-10-CM | POA: Diagnosis not present

## 2022-06-11 DIAGNOSIS — N390 Urinary tract infection, site not specified: Secondary | ICD-10-CM | POA: Diagnosis not present

## 2022-06-14 DIAGNOSIS — N3020 Other chronic cystitis without hematuria: Secondary | ICD-10-CM | POA: Diagnosis not present

## 2022-06-14 DIAGNOSIS — B3731 Acute candidiasis of vulva and vagina: Secondary | ICD-10-CM | POA: Diagnosis not present

## 2022-06-21 DIAGNOSIS — I639 Cerebral infarction, unspecified: Secondary | ICD-10-CM | POA: Diagnosis not present

## 2022-06-21 DIAGNOSIS — E1122 Type 2 diabetes mellitus with diabetic chronic kidney disease: Secondary | ICD-10-CM | POA: Diagnosis not present

## 2022-06-21 DIAGNOSIS — I5030 Unspecified diastolic (congestive) heart failure: Secondary | ICD-10-CM | POA: Diagnosis not present

## 2022-06-21 DIAGNOSIS — I2510 Atherosclerotic heart disease of native coronary artery without angina pectoris: Secondary | ICD-10-CM | POA: Diagnosis not present

## 2022-07-01 DIAGNOSIS — G894 Chronic pain syndrome: Secondary | ICD-10-CM | POA: Diagnosis not present

## 2022-07-01 DIAGNOSIS — K219 Gastro-esophageal reflux disease without esophagitis: Secondary | ICD-10-CM | POA: Diagnosis not present

## 2022-07-01 DIAGNOSIS — I129 Hypertensive chronic kidney disease with stage 1 through stage 4 chronic kidney disease, or unspecified chronic kidney disease: Secondary | ICD-10-CM | POA: Diagnosis not present

## 2022-07-01 DIAGNOSIS — I5032 Chronic diastolic (congestive) heart failure: Secondary | ICD-10-CM | POA: Diagnosis not present

## 2022-07-02 DIAGNOSIS — F5101 Primary insomnia: Secondary | ICD-10-CM | POA: Diagnosis not present

## 2022-07-04 DIAGNOSIS — G894 Chronic pain syndrome: Secondary | ICD-10-CM | POA: Diagnosis not present

## 2022-07-04 DIAGNOSIS — K219 Gastro-esophageal reflux disease without esophagitis: Secondary | ICD-10-CM | POA: Diagnosis not present

## 2022-07-04 DIAGNOSIS — I129 Hypertensive chronic kidney disease with stage 1 through stage 4 chronic kidney disease, or unspecified chronic kidney disease: Secondary | ICD-10-CM | POA: Diagnosis not present

## 2022-07-04 DIAGNOSIS — I5032 Chronic diastolic (congestive) heart failure: Secondary | ICD-10-CM | POA: Diagnosis not present

## 2022-07-10 DIAGNOSIS — K219 Gastro-esophageal reflux disease without esophagitis: Secondary | ICD-10-CM | POA: Diagnosis not present

## 2022-07-10 DIAGNOSIS — I129 Hypertensive chronic kidney disease with stage 1 through stage 4 chronic kidney disease, or unspecified chronic kidney disease: Secondary | ICD-10-CM | POA: Diagnosis not present

## 2022-07-10 DIAGNOSIS — G894 Chronic pain syndrome: Secondary | ICD-10-CM | POA: Diagnosis not present

## 2022-07-10 DIAGNOSIS — I5032 Chronic diastolic (congestive) heart failure: Secondary | ICD-10-CM | POA: Diagnosis not present

## 2022-07-15 DIAGNOSIS — G894 Chronic pain syndrome: Secondary | ICD-10-CM | POA: Diagnosis not present

## 2022-07-15 DIAGNOSIS — I129 Hypertensive chronic kidney disease with stage 1 through stage 4 chronic kidney disease, or unspecified chronic kidney disease: Secondary | ICD-10-CM | POA: Diagnosis not present

## 2022-07-15 DIAGNOSIS — K219 Gastro-esophageal reflux disease without esophagitis: Secondary | ICD-10-CM | POA: Diagnosis not present

## 2022-07-15 DIAGNOSIS — I5032 Chronic diastolic (congestive) heart failure: Secondary | ICD-10-CM | POA: Diagnosis not present

## 2022-07-15 DIAGNOSIS — F32A Depression, unspecified: Secondary | ICD-10-CM | POA: Diagnosis not present

## 2022-07-16 DIAGNOSIS — I129 Hypertensive chronic kidney disease with stage 1 through stage 4 chronic kidney disease, or unspecified chronic kidney disease: Secondary | ICD-10-CM | POA: Diagnosis not present

## 2022-07-16 DIAGNOSIS — I5032 Chronic diastolic (congestive) heart failure: Secondary | ICD-10-CM | POA: Diagnosis not present

## 2022-07-16 DIAGNOSIS — F32A Depression, unspecified: Secondary | ICD-10-CM | POA: Diagnosis not present

## 2022-07-16 DIAGNOSIS — R300 Dysuria: Secondary | ICD-10-CM | POA: Diagnosis not present

## 2022-07-17 DIAGNOSIS — N390 Urinary tract infection, site not specified: Secondary | ICD-10-CM | POA: Diagnosis not present

## 2022-07-17 DIAGNOSIS — F32A Depression, unspecified: Secondary | ICD-10-CM | POA: Diagnosis not present

## 2022-07-17 DIAGNOSIS — G894 Chronic pain syndrome: Secondary | ICD-10-CM | POA: Diagnosis not present

## 2022-07-17 DIAGNOSIS — I5032 Chronic diastolic (congestive) heart failure: Secondary | ICD-10-CM | POA: Diagnosis not present

## 2022-07-17 DIAGNOSIS — G629 Polyneuropathy, unspecified: Secondary | ICD-10-CM | POA: Diagnosis not present

## 2022-07-17 DIAGNOSIS — L299 Pruritus, unspecified: Secondary | ICD-10-CM | POA: Diagnosis not present

## 2022-07-22 DIAGNOSIS — G629 Polyneuropathy, unspecified: Secondary | ICD-10-CM | POA: Diagnosis not present

## 2022-07-22 DIAGNOSIS — F32A Depression, unspecified: Secondary | ICD-10-CM | POA: Diagnosis not present

## 2022-07-22 DIAGNOSIS — I5032 Chronic diastolic (congestive) heart failure: Secondary | ICD-10-CM | POA: Diagnosis not present

## 2022-07-22 DIAGNOSIS — N390 Urinary tract infection, site not specified: Secondary | ICD-10-CM | POA: Diagnosis not present

## 2022-07-22 DIAGNOSIS — G894 Chronic pain syndrome: Secondary | ICD-10-CM | POA: Diagnosis not present

## 2022-07-23 DIAGNOSIS — F5101 Primary insomnia: Secondary | ICD-10-CM | POA: Diagnosis not present

## 2022-07-24 DIAGNOSIS — N390 Urinary tract infection, site not specified: Secondary | ICD-10-CM | POA: Diagnosis not present

## 2022-07-24 DIAGNOSIS — I129 Hypertensive chronic kidney disease with stage 1 through stage 4 chronic kidney disease, or unspecified chronic kidney disease: Secondary | ICD-10-CM | POA: Diagnosis not present

## 2022-07-24 DIAGNOSIS — I5032 Chronic diastolic (congestive) heart failure: Secondary | ICD-10-CM | POA: Diagnosis not present

## 2022-07-24 DIAGNOSIS — K219 Gastro-esophageal reflux disease without esophagitis: Secondary | ICD-10-CM | POA: Diagnosis not present

## 2022-07-25 DIAGNOSIS — D333 Benign neoplasm of cranial nerves: Secondary | ICD-10-CM | POA: Diagnosis not present

## 2022-07-25 DIAGNOSIS — H903 Sensorineural hearing loss, bilateral: Secondary | ICD-10-CM | POA: Diagnosis not present

## 2022-07-30 ENCOUNTER — Other Ambulatory Visit: Payer: Self-pay | Admitting: Physician Assistant

## 2022-07-30 DIAGNOSIS — H903 Sensorineural hearing loss, bilateral: Secondary | ICD-10-CM

## 2022-07-30 DIAGNOSIS — D333 Benign neoplasm of cranial nerves: Secondary | ICD-10-CM

## 2022-08-01 DIAGNOSIS — S31821A Laceration without foreign body of left buttock, initial encounter: Secondary | ICD-10-CM | POA: Diagnosis not present

## 2022-08-01 DIAGNOSIS — E119 Type 2 diabetes mellitus without complications: Secondary | ICD-10-CM | POA: Diagnosis not present

## 2022-08-02 DIAGNOSIS — I639 Cerebral infarction, unspecified: Secondary | ICD-10-CM | POA: Diagnosis not present

## 2022-08-02 DIAGNOSIS — I2510 Atherosclerotic heart disease of native coronary artery without angina pectoris: Secondary | ICD-10-CM | POA: Diagnosis not present

## 2022-08-02 DIAGNOSIS — I5030 Unspecified diastolic (congestive) heart failure: Secondary | ICD-10-CM | POA: Diagnosis not present

## 2022-08-02 DIAGNOSIS — E1122 Type 2 diabetes mellitus with diabetic chronic kidney disease: Secondary | ICD-10-CM | POA: Diagnosis not present

## 2022-08-05 DIAGNOSIS — F32A Depression, unspecified: Secondary | ICD-10-CM | POA: Diagnosis not present

## 2022-08-05 DIAGNOSIS — G894 Chronic pain syndrome: Secondary | ICD-10-CM | POA: Diagnosis not present

## 2022-08-05 DIAGNOSIS — I5032 Chronic diastolic (congestive) heart failure: Secondary | ICD-10-CM | POA: Diagnosis not present

## 2022-08-05 DIAGNOSIS — I129 Hypertensive chronic kidney disease with stage 1 through stage 4 chronic kidney disease, or unspecified chronic kidney disease: Secondary | ICD-10-CM | POA: Diagnosis not present

## 2022-08-07 DIAGNOSIS — K219 Gastro-esophageal reflux disease without esophagitis: Secondary | ICD-10-CM | POA: Diagnosis not present

## 2022-08-07 DIAGNOSIS — F32A Depression, unspecified: Secondary | ICD-10-CM | POA: Diagnosis not present

## 2022-08-07 DIAGNOSIS — I129 Hypertensive chronic kidney disease with stage 1 through stage 4 chronic kidney disease, or unspecified chronic kidney disease: Secondary | ICD-10-CM | POA: Diagnosis not present

## 2022-08-07 DIAGNOSIS — I5032 Chronic diastolic (congestive) heart failure: Secondary | ICD-10-CM | POA: Diagnosis not present

## 2022-08-08 DIAGNOSIS — E119 Type 2 diabetes mellitus without complications: Secondary | ICD-10-CM | POA: Diagnosis not present

## 2022-08-08 DIAGNOSIS — S31821A Laceration without foreign body of left buttock, initial encounter: Secondary | ICD-10-CM | POA: Diagnosis not present

## 2022-08-13 DIAGNOSIS — F32A Depression, unspecified: Secondary | ICD-10-CM | POA: Diagnosis not present

## 2022-08-13 DIAGNOSIS — I129 Hypertensive chronic kidney disease with stage 1 through stage 4 chronic kidney disease, or unspecified chronic kidney disease: Secondary | ICD-10-CM | POA: Diagnosis not present

## 2022-08-13 DIAGNOSIS — I5032 Chronic diastolic (congestive) heart failure: Secondary | ICD-10-CM | POA: Diagnosis not present

## 2022-08-13 DIAGNOSIS — G894 Chronic pain syndrome: Secondary | ICD-10-CM | POA: Diagnosis not present

## 2022-08-14 DIAGNOSIS — D649 Anemia, unspecified: Secondary | ICD-10-CM | POA: Diagnosis not present

## 2022-08-14 DIAGNOSIS — E119 Type 2 diabetes mellitus without complications: Secondary | ICD-10-CM | POA: Diagnosis not present

## 2022-08-15 DIAGNOSIS — G629 Polyneuropathy, unspecified: Secondary | ICD-10-CM | POA: Diagnosis not present

## 2022-08-15 DIAGNOSIS — G894 Chronic pain syndrome: Secondary | ICD-10-CM | POA: Diagnosis not present

## 2022-08-15 DIAGNOSIS — E119 Type 2 diabetes mellitus without complications: Secondary | ICD-10-CM | POA: Diagnosis not present

## 2022-08-15 DIAGNOSIS — I129 Hypertensive chronic kidney disease with stage 1 through stage 4 chronic kidney disease, or unspecified chronic kidney disease: Secondary | ICD-10-CM | POA: Diagnosis not present

## 2022-08-15 DIAGNOSIS — F32A Depression, unspecified: Secondary | ICD-10-CM | POA: Diagnosis not present

## 2022-08-15 DIAGNOSIS — S31821A Laceration without foreign body of left buttock, initial encounter: Secondary | ICD-10-CM | POA: Diagnosis not present

## 2022-08-18 DIAGNOSIS — G629 Polyneuropathy, unspecified: Secondary | ICD-10-CM | POA: Diagnosis not present

## 2022-08-18 DIAGNOSIS — K5900 Constipation, unspecified: Secondary | ICD-10-CM | POA: Diagnosis not present

## 2022-08-18 DIAGNOSIS — F32A Depression, unspecified: Secondary | ICD-10-CM | POA: Diagnosis not present

## 2022-08-18 DIAGNOSIS — G894 Chronic pain syndrome: Secondary | ICD-10-CM | POA: Diagnosis not present

## 2022-08-18 DIAGNOSIS — I5032 Chronic diastolic (congestive) heart failure: Secondary | ICD-10-CM | POA: Diagnosis not present

## 2022-08-20 ENCOUNTER — Other Ambulatory Visit: Payer: Medicare Other

## 2022-08-22 DIAGNOSIS — S31821A Laceration without foreign body of left buttock, initial encounter: Secondary | ICD-10-CM | POA: Diagnosis not present

## 2022-08-22 DIAGNOSIS — E119 Type 2 diabetes mellitus without complications: Secondary | ICD-10-CM | POA: Diagnosis not present

## 2022-08-23 DIAGNOSIS — I5030 Unspecified diastolic (congestive) heart failure: Secondary | ICD-10-CM | POA: Diagnosis not present

## 2022-08-23 DIAGNOSIS — I639 Cerebral infarction, unspecified: Secondary | ICD-10-CM | POA: Diagnosis not present

## 2022-08-23 DIAGNOSIS — E1122 Type 2 diabetes mellitus with diabetic chronic kidney disease: Secondary | ICD-10-CM | POA: Diagnosis not present

## 2022-08-23 DIAGNOSIS — I2510 Atherosclerotic heart disease of native coronary artery without angina pectoris: Secondary | ICD-10-CM | POA: Diagnosis not present

## 2022-08-28 ENCOUNTER — Ambulatory Visit
Admission: RE | Admit: 2022-08-28 | Discharge: 2022-08-28 | Disposition: A | Discharge status: Home / Self Care | Payer: Medicaid Other | Source: Ambulatory Visit | Attending: Physician Assistant | Admitting: Physician Assistant

## 2022-08-28 DIAGNOSIS — D333 Benign neoplasm of cranial nerves: Secondary | ICD-10-CM

## 2022-08-28 DIAGNOSIS — H9191 Unspecified hearing loss, right ear: Secondary | ICD-10-CM | POA: Diagnosis not present

## 2022-08-28 DIAGNOSIS — H903 Sensorineural hearing loss, bilateral: Secondary | ICD-10-CM

## 2022-08-28 MED ORDER — GADOPICLENOL 0.5 MMOL/ML IV SOLN
7.0000 mL | Freq: Once | INTRAVENOUS | Status: AC | PRN
  Administered 2022-08-28: 7 mL via INTRAVENOUS

## 2022-08-29 DIAGNOSIS — I129 Hypertensive chronic kidney disease with stage 1 through stage 4 chronic kidney disease, or unspecified chronic kidney disease: Secondary | ICD-10-CM | POA: Diagnosis not present

## 2022-08-29 DIAGNOSIS — M6281 Muscle weakness (generalized): Secondary | ICD-10-CM | POA: Diagnosis not present

## 2022-08-29 DIAGNOSIS — A419 Sepsis, unspecified organism: Secondary | ICD-10-CM | POA: Diagnosis not present

## 2022-08-29 DIAGNOSIS — S31821A Laceration without foreign body of left buttock, initial encounter: Secondary | ICD-10-CM | POA: Diagnosis not present

## 2022-08-29 DIAGNOSIS — F32A Depression, unspecified: Secondary | ICD-10-CM | POA: Diagnosis not present

## 2022-08-29 DIAGNOSIS — E119 Type 2 diabetes mellitus without complications: Secondary | ICD-10-CM | POA: Diagnosis not present

## 2022-08-29 DIAGNOSIS — G894 Chronic pain syndrome: Secondary | ICD-10-CM | POA: Diagnosis not present

## 2022-08-29 DIAGNOSIS — I5032 Chronic diastolic (congestive) heart failure: Secondary | ICD-10-CM | POA: Diagnosis not present

## 2022-08-29 DIAGNOSIS — K219 Gastro-esophageal reflux disease without esophagitis: Secondary | ICD-10-CM | POA: Diagnosis not present

## 2022-08-29 DIAGNOSIS — G629 Polyneuropathy, unspecified: Secondary | ICD-10-CM | POA: Diagnosis not present

## 2022-09-02 DIAGNOSIS — I158 Other secondary hypertension: Secondary | ICD-10-CM | POA: Diagnosis not present

## 2022-09-02 DIAGNOSIS — E119 Type 2 diabetes mellitus without complications: Secondary | ICD-10-CM | POA: Diagnosis not present

## 2022-09-04 ENCOUNTER — Ambulatory Visit: Payer: 59 | Admitting: Podiatry

## 2022-09-04 ENCOUNTER — Encounter: Payer: Self-pay | Admitting: Podiatry

## 2022-09-04 DIAGNOSIS — E1142 Type 2 diabetes mellitus with diabetic polyneuropathy: Secondary | ICD-10-CM | POA: Diagnosis not present

## 2022-09-04 DIAGNOSIS — B351 Tinea unguium: Secondary | ICD-10-CM

## 2022-09-04 DIAGNOSIS — S99822A Other specified injuries of left foot, initial encounter: Secondary | ICD-10-CM

## 2022-09-04 DIAGNOSIS — L600 Ingrowing nail: Secondary | ICD-10-CM

## 2022-09-04 DIAGNOSIS — M79676 Pain in unspecified toe(s): Secondary | ICD-10-CM

## 2022-09-04 NOTE — Progress Notes (Signed)
Subjective:  Patient ID: Andrea Kirk, female    DOB: [birth year],  MRN: [id number]  Andrea Kirk presents to clinic today for at risk foot care with history of diabetic neuropathy and painful thick toenails that are difficult to trim. Pain interferes with ambulation. Aggravating factors include wearing enclosed shoe gear. Pain is relieved with periodic professional debridement.  Chief Complaint  Patient presents with   Nail Problem    DFC,Referring PCP Georgann Housekeeper, MD,lov:08/24,A1C:8.1,BS:unknown  She is a resident of [facility]   New problem(s): None.   PCP is Georgann Housekeeper, MD.  Allergies  Allergen Reactions   Codeine Other (See Comments)    Abnormal behavior    Latex Rash and Other (See Comments)    tears skin    Morphine And Codeine Other (See Comments)    Affects BP and blood sugar.   Cyclobenzaprine     MADE PT SICK- pt unsure if med was cyclobenzaprine or methocarbamol    Methocarbamol Other (See Comments)    MADE PT SICK- pt unsure if med was cyclobenzaprine or methocarbamol  11/09/20 pt currently takes methocarbamol    Review of Systems: Negative except as noted in the HPI.  Objective: No changes noted in today's physical examination. There were no vitals filed for this visit. Andrea Kirk is a pleasant 82 y.o. female in NAD. AAO x 3.  Vascular Examination: CFT <3 seconds b/l LE. Palpable PT pulse(s) b/l LE. Faintly palpable DP pulses b/l LE. Pedal hair absent. No pain with calf compression b/l. Lower extremity skin temperature gradient within normal limits. No edema noted b/l LE. Evidence of chronic venous insufficiency b/l LE. No ischemia or gangrene noted b/l LE. No cyanosis or clubbing noted b/l LE.  Dermatological Examination: Pedal skin thin, shiny and atrophic b/l LE. No open wounds b/l LE. No interdigital macerations noted b/l LE.   Toenails right great toe and 2-5 b/l elongated, discolored, dystrophic, thickened, crumbly with  subungual debris and tenderness to dorsal palpation.   Incurvated nailplate medial border of L 2nd toe.  Nail border hypertrophy absent. There is tenderness to palpation. Sign(s) of infection: no clinical signs of infection noted on examination today.. There is evidence of subungual seroma of the left great toe. There is onycholysis of nailplate. There is no  tenderness to palpation. No erythema, no edema.  No hyperkeratotic nor porokeratotic lesions present on today's visit.  Neurological Examination: Pt has subjective symptoms of neuropathy. Protective sensation intact 5/5 intact bilaterally with 10g monofilament b/l. Vibratory sensation intact b/l.  Musculoskeletal Examination: Muscle strength 5/5 to all lower extremity muscle groups bilaterally. No pain, crepitus or joint limitation noted with ROM bilateral LE. HAV with bunion deformity noted b/l LE. Utilizes wheelchair for mobility assistance.  Assessment/Plan: 1. Pain due to onychomycosis of toenail   2. Ingrown toenail without infection   3. Subungual injury of toe of left foot, initial encounter   4. Diabetic polyneuropathy associated with type 2 diabetes mellitus (HCC)     -Consent given for treatment as described below: -Examined patient. -Facility to continue fall precautions and pressure precautions. -Patient to continue soft, supportive shoe gear daily. -Toenails 2-5 bilaterally and right great toe debrided in length and girth without iatrogenic bleeding with sterile nail nipper and dremel.   -Loose nailplate left great toe gently debrided en toto. Digital nailbed cleansed with alcohol. Triple antibiotic ointment applied to nailbed followed by light dressing. Orders written for facility to apply triple antibiotic ointment to left great  toe once daily for 7 days. -No invasive procedure(s) performed. Offending nail border debrided and curretaged L 2nd toe utilizing sterile nail nipper and currette. Border(s) cleansed with alcohol  and triple antibiotic ointment applied. Patient/POA/Caregiver/Facility instructed to apply triple antibiotic ointment  to L 2nd toe once daily for 7 days. Call office if there are any concerns. -Patient/POA to call should there be question/concern in the interim.   Return in about 3 months (around 12/05/2022).  Freddie Breech, DPM

## 2022-09-08 DIAGNOSIS — K219 Gastro-esophageal reflux disease without esophagitis: Secondary | ICD-10-CM | POA: Diagnosis not present

## 2022-09-08 DIAGNOSIS — E119 Type 2 diabetes mellitus without complications: Secondary | ICD-10-CM | POA: Diagnosis not present

## 2022-09-08 DIAGNOSIS — I5032 Chronic diastolic (congestive) heart failure: Secondary | ICD-10-CM | POA: Diagnosis not present

## 2022-09-08 DIAGNOSIS — G629 Polyneuropathy, unspecified: Secondary | ICD-10-CM | POA: Diagnosis not present

## 2022-09-08 DIAGNOSIS — I129 Hypertensive chronic kidney disease with stage 1 through stage 4 chronic kidney disease, or unspecified chronic kidney disease: Secondary | ICD-10-CM | POA: Diagnosis not present

## 2022-09-08 DIAGNOSIS — K5900 Constipation, unspecified: Secondary | ICD-10-CM | POA: Diagnosis not present

## 2022-09-08 DIAGNOSIS — G894 Chronic pain syndrome: Secondary | ICD-10-CM | POA: Diagnosis not present

## 2022-09-08 DIAGNOSIS — M549 Dorsalgia, unspecified: Secondary | ICD-10-CM | POA: Diagnosis not present

## 2022-09-11 DIAGNOSIS — R8271 Bacteriuria: Secondary | ICD-10-CM | POA: Diagnosis not present

## 2022-09-11 DIAGNOSIS — I5032 Chronic diastolic (congestive) heart failure: Secondary | ICD-10-CM | POA: Diagnosis not present

## 2022-09-11 DIAGNOSIS — N3020 Other chronic cystitis without hematuria: Secondary | ICD-10-CM | POA: Diagnosis not present

## 2022-09-11 DIAGNOSIS — I129 Hypertensive chronic kidney disease with stage 1 through stage 4 chronic kidney disease, or unspecified chronic kidney disease: Secondary | ICD-10-CM | POA: Diagnosis not present

## 2022-09-11 DIAGNOSIS — K219 Gastro-esophageal reflux disease without esophagitis: Secondary | ICD-10-CM | POA: Diagnosis not present

## 2022-09-12 DIAGNOSIS — S30810D Abrasion of lower back and pelvis, subsequent encounter: Secondary | ICD-10-CM | POA: Diagnosis not present

## 2022-09-12 DIAGNOSIS — E119 Type 2 diabetes mellitus without complications: Secondary | ICD-10-CM | POA: Diagnosis not present

## 2022-09-15 DIAGNOSIS — G629 Polyneuropathy, unspecified: Secondary | ICD-10-CM | POA: Diagnosis not present

## 2022-09-15 DIAGNOSIS — R059 Cough, unspecified: Secondary | ICD-10-CM | POA: Diagnosis not present

## 2022-09-15 DIAGNOSIS — K219 Gastro-esophageal reflux disease without esophagitis: Secondary | ICD-10-CM | POA: Diagnosis not present

## 2022-09-15 DIAGNOSIS — R609 Edema, unspecified: Secondary | ICD-10-CM | POA: Diagnosis not present

## 2022-09-15 DIAGNOSIS — I5032 Chronic diastolic (congestive) heart failure: Secondary | ICD-10-CM | POA: Diagnosis not present

## 2022-09-15 DIAGNOSIS — G894 Chronic pain syndrome: Secondary | ICD-10-CM | POA: Diagnosis not present

## 2022-09-15 DIAGNOSIS — I129 Hypertensive chronic kidney disease with stage 1 through stage 4 chronic kidney disease, or unspecified chronic kidney disease: Secondary | ICD-10-CM | POA: Diagnosis not present

## 2022-09-16 DIAGNOSIS — R059 Cough, unspecified: Secondary | ICD-10-CM | POA: Diagnosis not present

## 2022-09-20 DIAGNOSIS — I10 Essential (primary) hypertension: Secondary | ICD-10-CM | POA: Diagnosis not present

## 2022-09-20 DIAGNOSIS — I2510 Atherosclerotic heart disease of native coronary artery without angina pectoris: Secondary | ICD-10-CM | POA: Diagnosis not present

## 2022-09-20 DIAGNOSIS — I639 Cerebral infarction, unspecified: Secondary | ICD-10-CM | POA: Diagnosis not present

## 2022-09-20 DIAGNOSIS — I272 Pulmonary hypertension, unspecified: Secondary | ICD-10-CM | POA: Diagnosis not present

## 2022-09-20 DIAGNOSIS — I5030 Unspecified diastolic (congestive) heart failure: Secondary | ICD-10-CM | POA: Diagnosis not present

## 2022-09-20 DIAGNOSIS — E1122 Type 2 diabetes mellitus with diabetic chronic kidney disease: Secondary | ICD-10-CM | POA: Diagnosis not present

## 2022-09-21 DIAGNOSIS — K5900 Constipation, unspecified: Secondary | ICD-10-CM | POA: Diagnosis not present

## 2022-09-21 DIAGNOSIS — I5032 Chronic diastolic (congestive) heart failure: Secondary | ICD-10-CM | POA: Diagnosis not present

## 2022-09-21 DIAGNOSIS — I129 Hypertensive chronic kidney disease with stage 1 through stage 4 chronic kidney disease, or unspecified chronic kidney disease: Secondary | ICD-10-CM | POA: Diagnosis not present

## 2022-09-21 DIAGNOSIS — G629 Polyneuropathy, unspecified: Secondary | ICD-10-CM | POA: Diagnosis not present

## 2022-09-21 DIAGNOSIS — D333 Benign neoplasm of cranial nerves: Secondary | ICD-10-CM | POA: Diagnosis not present

## 2022-09-21 DIAGNOSIS — G894 Chronic pain syndrome: Secondary | ICD-10-CM | POA: Diagnosis not present

## 2022-09-26 DIAGNOSIS — I5032 Chronic diastolic (congestive) heart failure: Secondary | ICD-10-CM | POA: Diagnosis not present

## 2022-09-26 DIAGNOSIS — R059 Cough, unspecified: Secondary | ICD-10-CM | POA: Diagnosis not present

## 2022-09-26 DIAGNOSIS — K219 Gastro-esophageal reflux disease without esophagitis: Secondary | ICD-10-CM | POA: Diagnosis not present

## 2022-09-26 DIAGNOSIS — I129 Hypertensive chronic kidney disease with stage 1 through stage 4 chronic kidney disease, or unspecified chronic kidney disease: Secondary | ICD-10-CM | POA: Diagnosis not present

## 2022-09-26 DIAGNOSIS — G894 Chronic pain syndrome: Secondary | ICD-10-CM | POA: Diagnosis not present

## 2022-10-01 DIAGNOSIS — I129 Hypertensive chronic kidney disease with stage 1 through stage 4 chronic kidney disease, or unspecified chronic kidney disease: Secondary | ICD-10-CM | POA: Diagnosis not present

## 2022-10-01 DIAGNOSIS — I5032 Chronic diastolic (congestive) heart failure: Secondary | ICD-10-CM | POA: Diagnosis not present

## 2022-10-01 DIAGNOSIS — G894 Chronic pain syndrome: Secondary | ICD-10-CM | POA: Diagnosis not present

## 2022-10-01 DIAGNOSIS — G629 Polyneuropathy, unspecified: Secondary | ICD-10-CM | POA: Diagnosis not present

## 2022-10-04 DIAGNOSIS — R2689 Other abnormalities of gait and mobility: Secondary | ICD-10-CM | POA: Diagnosis not present

## 2022-10-04 DIAGNOSIS — A419 Sepsis, unspecified organism: Secondary | ICD-10-CM | POA: Diagnosis not present

## 2022-10-04 DIAGNOSIS — M62831 Muscle spasm of calf: Secondary | ICD-10-CM | POA: Diagnosis not present

## 2022-10-04 DIAGNOSIS — I5032 Chronic diastolic (congestive) heart failure: Secondary | ICD-10-CM | POA: Diagnosis not present

## 2022-10-04 DIAGNOSIS — I158 Other secondary hypertension: Secondary | ICD-10-CM | POA: Diagnosis not present

## 2022-10-04 DIAGNOSIS — K219 Gastro-esophageal reflux disease without esophagitis: Secondary | ICD-10-CM | POA: Diagnosis not present

## 2022-10-04 DIAGNOSIS — R609 Edema, unspecified: Secondary | ICD-10-CM | POA: Diagnosis not present

## 2022-10-04 DIAGNOSIS — M6281 Muscle weakness (generalized): Secondary | ICD-10-CM | POA: Diagnosis not present

## 2022-10-04 DIAGNOSIS — E119 Type 2 diabetes mellitus without complications: Secondary | ICD-10-CM | POA: Diagnosis not present

## 2022-10-04 DIAGNOSIS — R52 Pain, unspecified: Secondary | ICD-10-CM | POA: Diagnosis not present

## 2022-10-04 DIAGNOSIS — I2510 Atherosclerotic heart disease of native coronary artery without angina pectoris: Secondary | ICD-10-CM | POA: Diagnosis not present

## 2022-10-04 DIAGNOSIS — R1312 Dysphagia, oropharyngeal phase: Secondary | ICD-10-CM | POA: Diagnosis not present

## 2022-10-04 DIAGNOSIS — S42121S Displaced fracture of acromial process, right shoulder, sequela: Secondary | ICD-10-CM | POA: Diagnosis not present

## 2022-10-05 DIAGNOSIS — I5032 Chronic diastolic (congestive) heart failure: Secondary | ICD-10-CM | POA: Diagnosis not present

## 2022-10-05 DIAGNOSIS — G629 Polyneuropathy, unspecified: Secondary | ICD-10-CM | POA: Diagnosis not present

## 2022-10-05 DIAGNOSIS — D333 Benign neoplasm of cranial nerves: Secondary | ICD-10-CM | POA: Diagnosis not present

## 2022-10-05 DIAGNOSIS — K5900 Constipation, unspecified: Secondary | ICD-10-CM | POA: Diagnosis not present

## 2022-10-05 DIAGNOSIS — K219 Gastro-esophageal reflux disease without esophagitis: Secondary | ICD-10-CM | POA: Diagnosis not present

## 2022-10-05 DIAGNOSIS — I129 Hypertensive chronic kidney disease with stage 1 through stage 4 chronic kidney disease, or unspecified chronic kidney disease: Secondary | ICD-10-CM | POA: Diagnosis not present

## 2022-10-05 DIAGNOSIS — K30 Functional dyspepsia: Secondary | ICD-10-CM | POA: Diagnosis not present

## 2022-10-05 DIAGNOSIS — G894 Chronic pain syndrome: Secondary | ICD-10-CM | POA: Diagnosis not present

## 2022-10-11 DIAGNOSIS — M6281 Muscle weakness (generalized): Secondary | ICD-10-CM | POA: Diagnosis not present

## 2022-10-11 DIAGNOSIS — I5032 Chronic diastolic (congestive) heart failure: Secondary | ICD-10-CM | POA: Diagnosis not present

## 2022-10-11 DIAGNOSIS — R2689 Other abnormalities of gait and mobility: Secondary | ICD-10-CM | POA: Diagnosis not present

## 2022-10-11 DIAGNOSIS — I2510 Atherosclerotic heart disease of native coronary artery without angina pectoris: Secondary | ICD-10-CM | POA: Diagnosis not present

## 2022-10-11 DIAGNOSIS — R1312 Dysphagia, oropharyngeal phase: Secondary | ICD-10-CM | POA: Diagnosis not present

## 2022-10-11 DIAGNOSIS — M62838 Other muscle spasm: Secondary | ICD-10-CM | POA: Diagnosis not present

## 2022-10-11 DIAGNOSIS — R609 Edema, unspecified: Secondary | ICD-10-CM | POA: Diagnosis not present

## 2022-10-11 DIAGNOSIS — S42121S Displaced fracture of acromial process, right shoulder, sequela: Secondary | ICD-10-CM | POA: Diagnosis not present

## 2022-10-11 DIAGNOSIS — K219 Gastro-esophageal reflux disease without esophagitis: Secondary | ICD-10-CM | POA: Diagnosis not present

## 2022-10-11 DIAGNOSIS — A419 Sepsis, unspecified organism: Secondary | ICD-10-CM | POA: Diagnosis not present

## 2022-10-11 DIAGNOSIS — R52 Pain, unspecified: Secondary | ICD-10-CM | POA: Diagnosis not present

## 2022-10-22 DIAGNOSIS — N3020 Other chronic cystitis without hematuria: Secondary | ICD-10-CM | POA: Diagnosis not present

## 2022-10-23 DIAGNOSIS — R062 Wheezing: Secondary | ICD-10-CM | POA: Diagnosis not present

## 2022-10-24 DIAGNOSIS — M6281 Muscle weakness (generalized): Secondary | ICD-10-CM | POA: Diagnosis not present

## 2022-10-24 DIAGNOSIS — R609 Edema, unspecified: Secondary | ICD-10-CM | POA: Diagnosis not present

## 2022-10-24 DIAGNOSIS — R52 Pain, unspecified: Secondary | ICD-10-CM | POA: Diagnosis not present

## 2022-10-24 DIAGNOSIS — I2510 Atherosclerotic heart disease of native coronary artery without angina pectoris: Secondary | ICD-10-CM | POA: Diagnosis not present

## 2022-10-24 DIAGNOSIS — I5032 Chronic diastolic (congestive) heart failure: Secondary | ICD-10-CM | POA: Diagnosis not present

## 2022-10-24 DIAGNOSIS — S42121S Displaced fracture of acromial process, right shoulder, sequela: Secondary | ICD-10-CM | POA: Diagnosis not present

## 2022-10-24 DIAGNOSIS — R2689 Other abnormalities of gait and mobility: Secondary | ICD-10-CM | POA: Diagnosis not present

## 2022-10-24 DIAGNOSIS — M62838 Other muscle spasm: Secondary | ICD-10-CM | POA: Diagnosis not present

## 2022-10-24 DIAGNOSIS — A419 Sepsis, unspecified organism: Secondary | ICD-10-CM | POA: Diagnosis not present

## 2022-10-24 DIAGNOSIS — R1312 Dysphagia, oropharyngeal phase: Secondary | ICD-10-CM | POA: Diagnosis not present

## 2022-10-24 DIAGNOSIS — K219 Gastro-esophageal reflux disease without esophagitis: Secondary | ICD-10-CM | POA: Diagnosis not present

## 2022-10-25 DIAGNOSIS — M6281 Muscle weakness (generalized): Secondary | ICD-10-CM | POA: Diagnosis not present

## 2022-10-25 DIAGNOSIS — S42121S Displaced fracture of acromial process, right shoulder, sequela: Secondary | ICD-10-CM | POA: Diagnosis not present

## 2022-10-25 DIAGNOSIS — I5032 Chronic diastolic (congestive) heart failure: Secondary | ICD-10-CM | POA: Diagnosis not present

## 2022-10-25 DIAGNOSIS — K219 Gastro-esophageal reflux disease without esophagitis: Secondary | ICD-10-CM | POA: Diagnosis not present

## 2022-10-25 DIAGNOSIS — R609 Edema, unspecified: Secondary | ICD-10-CM | POA: Diagnosis not present

## 2022-10-25 DIAGNOSIS — A419 Sepsis, unspecified organism: Secondary | ICD-10-CM | POA: Diagnosis not present

## 2022-10-25 DIAGNOSIS — R52 Pain, unspecified: Secondary | ICD-10-CM | POA: Diagnosis not present

## 2022-10-25 DIAGNOSIS — I2510 Atherosclerotic heart disease of native coronary artery without angina pectoris: Secondary | ICD-10-CM | POA: Diagnosis not present

## 2022-10-25 DIAGNOSIS — R1312 Dysphagia, oropharyngeal phase: Secondary | ICD-10-CM | POA: Diagnosis not present

## 2022-10-25 DIAGNOSIS — M62838 Other muscle spasm: Secondary | ICD-10-CM | POA: Diagnosis not present

## 2022-10-25 DIAGNOSIS — R2689 Other abnormalities of gait and mobility: Secondary | ICD-10-CM | POA: Diagnosis not present

## 2022-10-30 DIAGNOSIS — I2510 Atherosclerotic heart disease of native coronary artery without angina pectoris: Secondary | ICD-10-CM | POA: Diagnosis not present

## 2022-10-30 DIAGNOSIS — R52 Pain, unspecified: Secondary | ICD-10-CM | POA: Diagnosis not present

## 2022-10-30 DIAGNOSIS — M6281 Muscle weakness (generalized): Secondary | ICD-10-CM | POA: Diagnosis not present

## 2022-10-30 DIAGNOSIS — R1312 Dysphagia, oropharyngeal phase: Secondary | ICD-10-CM | POA: Diagnosis not present

## 2022-10-30 DIAGNOSIS — R609 Edema, unspecified: Secondary | ICD-10-CM | POA: Diagnosis not present

## 2022-10-30 DIAGNOSIS — I5032 Chronic diastolic (congestive) heart failure: Secondary | ICD-10-CM | POA: Diagnosis not present

## 2022-10-30 DIAGNOSIS — R2689 Other abnormalities of gait and mobility: Secondary | ICD-10-CM | POA: Diagnosis not present

## 2022-10-30 DIAGNOSIS — K219 Gastro-esophageal reflux disease without esophagitis: Secondary | ICD-10-CM | POA: Diagnosis not present

## 2022-10-30 DIAGNOSIS — S42121S Displaced fracture of acromial process, right shoulder, sequela: Secondary | ICD-10-CM | POA: Diagnosis not present

## 2022-10-30 DIAGNOSIS — M62838 Other muscle spasm: Secondary | ICD-10-CM | POA: Diagnosis not present

## 2022-10-31 DIAGNOSIS — R2689 Other abnormalities of gait and mobility: Secondary | ICD-10-CM | POA: Diagnosis not present

## 2022-10-31 DIAGNOSIS — R609 Edema, unspecified: Secondary | ICD-10-CM | POA: Diagnosis not present

## 2022-10-31 DIAGNOSIS — S42121S Displaced fracture of acromial process, right shoulder, sequela: Secondary | ICD-10-CM | POA: Diagnosis not present

## 2022-10-31 DIAGNOSIS — A419 Sepsis, unspecified organism: Secondary | ICD-10-CM | POA: Diagnosis not present

## 2022-10-31 DIAGNOSIS — I5032 Chronic diastolic (congestive) heart failure: Secondary | ICD-10-CM | POA: Diagnosis not present

## 2022-10-31 DIAGNOSIS — R52 Pain, unspecified: Secondary | ICD-10-CM | POA: Diagnosis not present

## 2022-10-31 DIAGNOSIS — R1312 Dysphagia, oropharyngeal phase: Secondary | ICD-10-CM | POA: Diagnosis not present

## 2022-10-31 DIAGNOSIS — M6281 Muscle weakness (generalized): Secondary | ICD-10-CM | POA: Diagnosis not present

## 2022-10-31 DIAGNOSIS — I2510 Atherosclerotic heart disease of native coronary artery without angina pectoris: Secondary | ICD-10-CM | POA: Diagnosis not present

## 2022-10-31 DIAGNOSIS — M62838 Other muscle spasm: Secondary | ICD-10-CM | POA: Diagnosis not present

## 2022-10-31 DIAGNOSIS — K219 Gastro-esophageal reflux disease without esophagitis: Secondary | ICD-10-CM | POA: Diagnosis not present

## 2022-11-04 DIAGNOSIS — E119 Type 2 diabetes mellitus without complications: Secondary | ICD-10-CM | POA: Diagnosis not present

## 2022-11-04 DIAGNOSIS — I158 Other secondary hypertension: Secondary | ICD-10-CM | POA: Diagnosis not present

## 2022-11-12 DIAGNOSIS — E119 Type 2 diabetes mellitus without complications: Secondary | ICD-10-CM | POA: Diagnosis not present

## 2022-11-12 DIAGNOSIS — M6281 Muscle weakness (generalized): Secondary | ICD-10-CM | POA: Diagnosis not present

## 2022-11-12 DIAGNOSIS — I5032 Chronic diastolic (congestive) heart failure: Secondary | ICD-10-CM | POA: Diagnosis not present

## 2022-11-12 DIAGNOSIS — R52 Pain, unspecified: Secondary | ICD-10-CM | POA: Diagnosis not present

## 2022-11-12 DIAGNOSIS — A419 Sepsis, unspecified organism: Secondary | ICD-10-CM | POA: Diagnosis not present

## 2022-11-12 DIAGNOSIS — I679 Cerebrovascular disease, unspecified: Secondary | ICD-10-CM | POA: Diagnosis not present

## 2022-11-12 DIAGNOSIS — M62838 Other muscle spasm: Secondary | ICD-10-CM | POA: Diagnosis not present

## 2022-11-12 DIAGNOSIS — R2689 Other abnormalities of gait and mobility: Secondary | ICD-10-CM | POA: Diagnosis not present

## 2022-11-12 DIAGNOSIS — K219 Gastro-esophageal reflux disease without esophagitis: Secondary | ICD-10-CM | POA: Diagnosis not present

## 2022-11-12 DIAGNOSIS — S42121S Displaced fracture of acromial process, right shoulder, sequela: Secondary | ICD-10-CM | POA: Diagnosis not present

## 2022-11-12 DIAGNOSIS — I2510 Atherosclerotic heart disease of native coronary artery without angina pectoris: Secondary | ICD-10-CM | POA: Diagnosis not present

## 2022-11-12 DIAGNOSIS — R609 Edema, unspecified: Secondary | ICD-10-CM | POA: Diagnosis not present

## 2022-11-12 DIAGNOSIS — I10 Essential (primary) hypertension: Secondary | ICD-10-CM | POA: Diagnosis not present

## 2022-11-12 DIAGNOSIS — R1312 Dysphagia, oropharyngeal phase: Secondary | ICD-10-CM | POA: Diagnosis not present

## 2022-11-13 DIAGNOSIS — M25551 Pain in right hip: Secondary | ICD-10-CM | POA: Diagnosis not present

## 2022-11-13 DIAGNOSIS — M25552 Pain in left hip: Secondary | ICD-10-CM | POA: Diagnosis not present

## 2022-11-19 DIAGNOSIS — R52 Pain, unspecified: Secondary | ICD-10-CM | POA: Diagnosis not present

## 2022-11-19 DIAGNOSIS — I2510 Atherosclerotic heart disease of native coronary artery without angina pectoris: Secondary | ICD-10-CM | POA: Diagnosis not present

## 2022-11-19 DIAGNOSIS — R2689 Other abnormalities of gait and mobility: Secondary | ICD-10-CM | POA: Diagnosis not present

## 2022-11-19 DIAGNOSIS — K219 Gastro-esophageal reflux disease without esophagitis: Secondary | ICD-10-CM | POA: Diagnosis not present

## 2022-11-19 DIAGNOSIS — M6281 Muscle weakness (generalized): Secondary | ICD-10-CM | POA: Diagnosis not present

## 2022-11-19 DIAGNOSIS — I679 Cerebrovascular disease, unspecified: Secondary | ICD-10-CM | POA: Diagnosis not present

## 2022-11-19 DIAGNOSIS — R609 Edema, unspecified: Secondary | ICD-10-CM | POA: Diagnosis not present

## 2022-11-19 DIAGNOSIS — A419 Sepsis, unspecified organism: Secondary | ICD-10-CM | POA: Diagnosis not present

## 2022-11-19 DIAGNOSIS — I5032 Chronic diastolic (congestive) heart failure: Secondary | ICD-10-CM | POA: Diagnosis not present

## 2022-11-19 DIAGNOSIS — S42121S Displaced fracture of acromial process, right shoulder, sequela: Secondary | ICD-10-CM | POA: Diagnosis not present

## 2022-11-19 DIAGNOSIS — R1312 Dysphagia, oropharyngeal phase: Secondary | ICD-10-CM | POA: Diagnosis not present

## 2022-11-20 DIAGNOSIS — I5032 Chronic diastolic (congestive) heart failure: Secondary | ICD-10-CM | POA: Diagnosis not present

## 2022-11-20 DIAGNOSIS — R1312 Dysphagia, oropharyngeal phase: Secondary | ICD-10-CM | POA: Diagnosis not present

## 2022-11-20 DIAGNOSIS — M62838 Other muscle spasm: Secondary | ICD-10-CM | POA: Diagnosis not present

## 2022-11-20 DIAGNOSIS — I2510 Atherosclerotic heart disease of native coronary artery without angina pectoris: Secondary | ICD-10-CM | POA: Diagnosis not present

## 2022-11-20 DIAGNOSIS — R2689 Other abnormalities of gait and mobility: Secondary | ICD-10-CM | POA: Diagnosis not present

## 2022-11-20 DIAGNOSIS — K219 Gastro-esophageal reflux disease without esophagitis: Secondary | ICD-10-CM | POA: Diagnosis not present

## 2022-11-20 DIAGNOSIS — S42121S Displaced fracture of acromial process, right shoulder, sequela: Secondary | ICD-10-CM | POA: Diagnosis not present

## 2022-11-20 DIAGNOSIS — A419 Sepsis, unspecified organism: Secondary | ICD-10-CM | POA: Diagnosis not present

## 2022-11-20 DIAGNOSIS — M6281 Muscle weakness (generalized): Secondary | ICD-10-CM | POA: Diagnosis not present

## 2022-11-20 DIAGNOSIS — R609 Edema, unspecified: Secondary | ICD-10-CM | POA: Diagnosis not present

## 2022-11-20 DIAGNOSIS — R52 Pain, unspecified: Secondary | ICD-10-CM | POA: Diagnosis not present

## 2022-11-29 DIAGNOSIS — R609 Edema, unspecified: Secondary | ICD-10-CM | POA: Diagnosis not present

## 2022-11-29 DIAGNOSIS — R2689 Other abnormalities of gait and mobility: Secondary | ICD-10-CM | POA: Diagnosis not present

## 2022-11-29 DIAGNOSIS — K219 Gastro-esophageal reflux disease without esophagitis: Secondary | ICD-10-CM | POA: Diagnosis not present

## 2022-11-29 DIAGNOSIS — I2510 Atherosclerotic heart disease of native coronary artery without angina pectoris: Secondary | ICD-10-CM | POA: Diagnosis not present

## 2022-11-29 DIAGNOSIS — I5032 Chronic diastolic (congestive) heart failure: Secondary | ICD-10-CM | POA: Diagnosis not present

## 2022-11-29 DIAGNOSIS — R1312 Dysphagia, oropharyngeal phase: Secondary | ICD-10-CM | POA: Diagnosis not present

## 2022-11-29 DIAGNOSIS — R52 Pain, unspecified: Secondary | ICD-10-CM | POA: Diagnosis not present

## 2022-11-29 DIAGNOSIS — M6281 Muscle weakness (generalized): Secondary | ICD-10-CM | POA: Diagnosis not present

## 2022-11-29 DIAGNOSIS — S42121S Displaced fracture of acromial process, right shoulder, sequela: Secondary | ICD-10-CM | POA: Diagnosis not present

## 2022-11-29 DIAGNOSIS — A419 Sepsis, unspecified organism: Secondary | ICD-10-CM | POA: Diagnosis not present

## 2022-11-30 DIAGNOSIS — A419 Sepsis, unspecified organism: Secondary | ICD-10-CM | POA: Diagnosis not present

## 2022-11-30 DIAGNOSIS — R609 Edema, unspecified: Secondary | ICD-10-CM | POA: Diagnosis not present

## 2022-11-30 DIAGNOSIS — I2510 Atherosclerotic heart disease of native coronary artery without angina pectoris: Secondary | ICD-10-CM | POA: Diagnosis not present

## 2022-11-30 DIAGNOSIS — I679 Cerebrovascular disease, unspecified: Secondary | ICD-10-CM | POA: Diagnosis not present

## 2022-11-30 DIAGNOSIS — S42121S Displaced fracture of acromial process, right shoulder, sequela: Secondary | ICD-10-CM | POA: Diagnosis not present

## 2022-11-30 DIAGNOSIS — R1312 Dysphagia, oropharyngeal phase: Secondary | ICD-10-CM | POA: Diagnosis not present

## 2022-11-30 DIAGNOSIS — K219 Gastro-esophageal reflux disease without esophagitis: Secondary | ICD-10-CM | POA: Diagnosis not present

## 2022-11-30 DIAGNOSIS — I5032 Chronic diastolic (congestive) heart failure: Secondary | ICD-10-CM | POA: Diagnosis not present

## 2022-11-30 DIAGNOSIS — R2689 Other abnormalities of gait and mobility: Secondary | ICD-10-CM | POA: Diagnosis not present

## 2022-11-30 DIAGNOSIS — R52 Pain, unspecified: Secondary | ICD-10-CM | POA: Diagnosis not present

## 2022-11-30 DIAGNOSIS — M6281 Muscle weakness (generalized): Secondary | ICD-10-CM | POA: Diagnosis not present

## 2022-12-02 ENCOUNTER — Ambulatory Visit (INDEPENDENT_AMBULATORY_CARE_PROVIDER_SITE_OTHER): Payer: 59 | Admitting: Podiatry

## 2022-12-02 ENCOUNTER — Encounter: Payer: Self-pay | Admitting: Podiatry

## 2022-12-02 DIAGNOSIS — I158 Other secondary hypertension: Secondary | ICD-10-CM | POA: Diagnosis not present

## 2022-12-02 DIAGNOSIS — B351 Tinea unguium: Secondary | ICD-10-CM | POA: Diagnosis not present

## 2022-12-02 DIAGNOSIS — R52 Pain, unspecified: Secondary | ICD-10-CM | POA: Diagnosis not present

## 2022-12-02 DIAGNOSIS — A419 Sepsis, unspecified organism: Secondary | ICD-10-CM | POA: Diagnosis not present

## 2022-12-02 DIAGNOSIS — M79676 Pain in unspecified toe(s): Secondary | ICD-10-CM

## 2022-12-02 DIAGNOSIS — I5032 Chronic diastolic (congestive) heart failure: Secondary | ICD-10-CM | POA: Diagnosis not present

## 2022-12-02 DIAGNOSIS — E1142 Type 2 diabetes mellitus with diabetic polyneuropathy: Secondary | ICD-10-CM

## 2022-12-02 DIAGNOSIS — R1312 Dysphagia, oropharyngeal phase: Secondary | ICD-10-CM | POA: Diagnosis not present

## 2022-12-02 DIAGNOSIS — R2689 Other abnormalities of gait and mobility: Secondary | ICD-10-CM | POA: Diagnosis not present

## 2022-12-02 DIAGNOSIS — K219 Gastro-esophageal reflux disease without esophagitis: Secondary | ICD-10-CM | POA: Diagnosis not present

## 2022-12-02 DIAGNOSIS — R609 Edema, unspecified: Secondary | ICD-10-CM | POA: Diagnosis not present

## 2022-12-02 DIAGNOSIS — E119 Type 2 diabetes mellitus without complications: Secondary | ICD-10-CM | POA: Diagnosis not present

## 2022-12-02 DIAGNOSIS — S42121S Displaced fracture of acromial process, right shoulder, sequela: Secondary | ICD-10-CM | POA: Diagnosis not present

## 2022-12-02 DIAGNOSIS — M6281 Muscle weakness (generalized): Secondary | ICD-10-CM | POA: Diagnosis not present

## 2022-12-02 DIAGNOSIS — I2510 Atherosclerotic heart disease of native coronary artery without angina pectoris: Secondary | ICD-10-CM | POA: Diagnosis not present

## 2022-12-02 DIAGNOSIS — M62838 Other muscle spasm: Secondary | ICD-10-CM | POA: Diagnosis not present

## 2022-12-02 NOTE — Progress Notes (Signed)
Subjective:  Patient ID: Andrea Kirk, female    DOB: 03/19/1940,   MRN: [id number]  No chief complaint on file.   82 y.o. female presents for concern of thickened elongated and painful nails that are difficult to trim. Requesting to have them trimmed today. Relates burning and tingling in their feet. Patient is diabetic and last A1c was  Lab Results  Component Value Date   HGBA1C 8.1 (H) 05/28/2022   .   PCP:  Georgann Housekeeper, MD    . Denies any other pedal complaints. Denies n/v/f/c.   Past Medical History:  Diagnosis Date   Acute kidney injury (HCC) 08/05/2016   Aftercare following surgery of the circulatory system, NEC 05/11/2013   AKI (acute kidney injury) (HCC) 08/04/2016   Anxiety    takes Celexa daily   ARF (acute renal failure) (HCC) 11/07/2016   Asymptomatic stenosis of right carotid artery 03/22/2016   Back pain    occasionally   Carotid artery disease (HCC) 04/16/2013   Carotid artery occlusion    Carotid stenosis 11/16/2013   Cataract    left and immature   Coronary artery disease    Coronary atherosclerosis of native coronary artery 03/11/2013   S/p CABG in 1997    Depression    Diabetes mellitus    takes Metformin and Glipizide daily   Diabetes mellitus (HCC) 05/02/2015   Dizziness    takes Meclizine daily as needed   Essential hypertension, benign 03/11/2013   GERD (gastroesophageal reflux disease)    takes Omeprazole daily as needed   Headache(784.0)    Hyperlipidemia    takes Atorvastatin daily   Hypertension    takes Carvedilol daily   Mixed hyperlipidemia 03/11/2013   Muscle spasm    takes Robaxin daily as needed   Nausea    takes Phenergan daily as needed   Occlusion and stenosis of carotid artery without mention of cerebral infarction 07/19/2011   Pneumonia    hx of-in high school   Restless leg    takes Requip daily as needed   Seasonal allergies    takes Claritin daily as needed and Afrin as needed   Shortness of breath    with exertion   Urinary  urgency    UTI (urinary tract infection) 11/07/2016    Objective:  Physical Exam: Vascular: DP/PT pulses 2/4 bilateral. CFT <3 seconds. Absent hair growth on digits. Edema noted to bilateral lower extremities. Xerosis noted bilaterally.  Skin. No lacerations or abrasions bilateral feet. Nails 1-5 bilateral  are thickened discolored and elongated with subungual debris.  Musculoskeletal: MMT 5/5 bilateral lower extremities in DF, PF, Inversion and Eversion. Deceased ROM in DF of ankle joint.  Neurological: Sensation intact to light touch. Protective sensation diminished bilateral.    Assessment:   1. Pain due to onychomycosis of toenail   2. Diabetic polyneuropathy associated with type 2 diabetes mellitus (HCC)      Plan:  Patient was evaluated and treated and all questions answered. -Discussed and educated patient on diabetic foot care, especially with  regards to the vascular, neurological and musculoskeletal systems.  -Stressed the importance of good glycemic control and the detriment of not  controlling glucose levels in relation to the foot. -Discussed supportive shoes at all times and checking feet regularly.  -Mechanically debrided all nails 1-5 bilateral using sterile nail nipper and filed with dremel without incident  -Answered all patient questions -Patient to return  in 3 months for at risk foot care -Patient advised to  call the office if any problems or questions arise in the meantime.   Louann Sjogren, DPM

## 2022-12-10 DIAGNOSIS — S42121S Displaced fracture of acromial process, right shoulder, sequela: Secondary | ICD-10-CM | POA: Diagnosis not present

## 2022-12-10 DIAGNOSIS — R1312 Dysphagia, oropharyngeal phase: Secondary | ICD-10-CM | POA: Diagnosis not present

## 2022-12-10 DIAGNOSIS — A419 Sepsis, unspecified organism: Secondary | ICD-10-CM | POA: Diagnosis not present

## 2022-12-10 DIAGNOSIS — M6281 Muscle weakness (generalized): Secondary | ICD-10-CM | POA: Diagnosis not present

## 2022-12-10 DIAGNOSIS — R609 Edema, unspecified: Secondary | ICD-10-CM | POA: Diagnosis not present

## 2022-12-10 DIAGNOSIS — I2510 Atherosclerotic heart disease of native coronary artery without angina pectoris: Secondary | ICD-10-CM | POA: Diagnosis not present

## 2022-12-10 DIAGNOSIS — I5032 Chronic diastolic (congestive) heart failure: Secondary | ICD-10-CM | POA: Diagnosis not present

## 2022-12-10 DIAGNOSIS — K219 Gastro-esophageal reflux disease without esophagitis: Secondary | ICD-10-CM | POA: Diagnosis not present

## 2022-12-10 DIAGNOSIS — R52 Pain, unspecified: Secondary | ICD-10-CM | POA: Diagnosis not present

## 2022-12-10 DIAGNOSIS — M62838 Other muscle spasm: Secondary | ICD-10-CM | POA: Diagnosis not present

## 2022-12-10 DIAGNOSIS — R2689 Other abnormalities of gait and mobility: Secondary | ICD-10-CM | POA: Diagnosis not present

## 2022-12-16 DIAGNOSIS — K219 Gastro-esophageal reflux disease without esophagitis: Secondary | ICD-10-CM | POA: Diagnosis not present

## 2022-12-16 DIAGNOSIS — E119 Type 2 diabetes mellitus without complications: Secondary | ICD-10-CM | POA: Diagnosis not present

## 2022-12-16 DIAGNOSIS — I2510 Atherosclerotic heart disease of native coronary artery without angina pectoris: Secondary | ICD-10-CM | POA: Diagnosis not present

## 2022-12-16 DIAGNOSIS — R2689 Other abnormalities of gait and mobility: Secondary | ICD-10-CM | POA: Diagnosis not present

## 2022-12-16 DIAGNOSIS — R1312 Dysphagia, oropharyngeal phase: Secondary | ICD-10-CM | POA: Diagnosis not present

## 2022-12-16 DIAGNOSIS — M62838 Other muscle spasm: Secondary | ICD-10-CM | POA: Diagnosis not present

## 2022-12-16 DIAGNOSIS — M6281 Muscle weakness (generalized): Secondary | ICD-10-CM | POA: Diagnosis not present

## 2022-12-16 DIAGNOSIS — A419 Sepsis, unspecified organism: Secondary | ICD-10-CM | POA: Diagnosis not present

## 2022-12-16 DIAGNOSIS — I158 Other secondary hypertension: Secondary | ICD-10-CM | POA: Diagnosis not present

## 2022-12-16 DIAGNOSIS — I5032 Chronic diastolic (congestive) heart failure: Secondary | ICD-10-CM | POA: Diagnosis not present

## 2022-12-16 DIAGNOSIS — R52 Pain, unspecified: Secondary | ICD-10-CM | POA: Diagnosis not present

## 2022-12-16 DIAGNOSIS — S42121S Displaced fracture of acromial process, right shoulder, sequela: Secondary | ICD-10-CM | POA: Diagnosis not present

## 2022-12-16 DIAGNOSIS — R609 Edema, unspecified: Secondary | ICD-10-CM | POA: Diagnosis not present

## 2022-12-17 DIAGNOSIS — H903 Sensorineural hearing loss, bilateral: Secondary | ICD-10-CM | POA: Diagnosis not present

## 2022-12-17 DIAGNOSIS — D333 Benign neoplasm of cranial nerves: Secondary | ICD-10-CM | POA: Diagnosis not present

## 2022-12-20 DIAGNOSIS — D649 Anemia, unspecified: Secondary | ICD-10-CM | POA: Diagnosis not present

## 2022-12-20 DIAGNOSIS — Z79899 Other long term (current) drug therapy: Secondary | ICD-10-CM | POA: Diagnosis not present

## 2022-12-23 DIAGNOSIS — M6281 Muscle weakness (generalized): Secondary | ICD-10-CM | POA: Diagnosis not present

## 2022-12-23 DIAGNOSIS — M62838 Other muscle spasm: Secondary | ICD-10-CM | POA: Diagnosis not present

## 2022-12-23 DIAGNOSIS — R2689 Other abnormalities of gait and mobility: Secondary | ICD-10-CM | POA: Diagnosis not present

## 2022-12-23 DIAGNOSIS — S42121S Displaced fracture of acromial process, right shoulder, sequela: Secondary | ICD-10-CM | POA: Diagnosis not present

## 2022-12-23 DIAGNOSIS — I5032 Chronic diastolic (congestive) heart failure: Secondary | ICD-10-CM | POA: Diagnosis not present

## 2022-12-23 DIAGNOSIS — R609 Edema, unspecified: Secondary | ICD-10-CM | POA: Diagnosis not present

## 2022-12-23 DIAGNOSIS — K219 Gastro-esophageal reflux disease without esophagitis: Secondary | ICD-10-CM | POA: Diagnosis not present

## 2022-12-23 DIAGNOSIS — R262 Difficulty in walking, not elsewhere classified: Secondary | ICD-10-CM | POA: Diagnosis not present

## 2022-12-23 DIAGNOSIS — A419 Sepsis, unspecified organism: Secondary | ICD-10-CM | POA: Diagnosis not present

## 2022-12-23 DIAGNOSIS — R1312 Dysphagia, oropharyngeal phase: Secondary | ICD-10-CM | POA: Diagnosis not present

## 2022-12-23 DIAGNOSIS — R52 Pain, unspecified: Secondary | ICD-10-CM | POA: Diagnosis not present

## 2022-12-23 DIAGNOSIS — I2510 Atherosclerotic heart disease of native coronary artery without angina pectoris: Secondary | ICD-10-CM | POA: Diagnosis not present

## 2022-12-24 DIAGNOSIS — D649 Anemia, unspecified: Secondary | ICD-10-CM | POA: Diagnosis not present

## 2022-12-24 DIAGNOSIS — I5032 Chronic diastolic (congestive) heart failure: Secondary | ICD-10-CM | POA: Diagnosis not present

## 2022-12-24 DIAGNOSIS — S42121S Displaced fracture of acromial process, right shoulder, sequela: Secondary | ICD-10-CM | POA: Diagnosis not present

## 2022-12-24 DIAGNOSIS — I2510 Atherosclerotic heart disease of native coronary artery without angina pectoris: Secondary | ICD-10-CM | POA: Diagnosis not present

## 2022-12-24 DIAGNOSIS — R1312 Dysphagia, oropharyngeal phase: Secondary | ICD-10-CM | POA: Diagnosis not present

## 2022-12-24 DIAGNOSIS — R2689 Other abnormalities of gait and mobility: Secondary | ICD-10-CM | POA: Diagnosis not present

## 2022-12-24 DIAGNOSIS — R52 Pain, unspecified: Secondary | ICD-10-CM | POA: Diagnosis not present

## 2022-12-24 DIAGNOSIS — A419 Sepsis, unspecified organism: Secondary | ICD-10-CM | POA: Diagnosis not present

## 2022-12-24 DIAGNOSIS — M6281 Muscle weakness (generalized): Secondary | ICD-10-CM | POA: Diagnosis not present

## 2022-12-24 DIAGNOSIS — R262 Difficulty in walking, not elsewhere classified: Secondary | ICD-10-CM | POA: Diagnosis not present

## 2022-12-24 DIAGNOSIS — M62838 Other muscle spasm: Secondary | ICD-10-CM | POA: Diagnosis not present

## 2022-12-24 DIAGNOSIS — R609 Edema, unspecified: Secondary | ICD-10-CM | POA: Diagnosis not present

## 2022-12-24 DIAGNOSIS — K219 Gastro-esophageal reflux disease without esophagitis: Secondary | ICD-10-CM | POA: Diagnosis not present

## 2022-12-25 ENCOUNTER — Ambulatory Visit (INDEPENDENT_AMBULATORY_CARE_PROVIDER_SITE_OTHER): Payer: 59 | Admitting: Obstetrics and Gynecology

## 2022-12-25 ENCOUNTER — Other Ambulatory Visit (HOSPITAL_COMMUNITY)
Admission: RE | Admit: 2022-12-25 | Discharge: 2022-12-25 | Disposition: A | Discharge status: Home / Self Care | Payer: 59 | Source: Ambulatory Visit | Attending: Obstetrics and Gynecology | Admitting: Obstetrics and Gynecology

## 2022-12-25 ENCOUNTER — Encounter: Payer: Self-pay | Admitting: Obstetrics and Gynecology

## 2022-12-25 VITALS — BP 103/67 | HR 66 | Ht 65.0 in

## 2022-12-25 DIAGNOSIS — N898 Other specified noninflammatory disorders of vagina: Secondary | ICD-10-CM

## 2022-12-25 DIAGNOSIS — N950 Postmenopausal bleeding: Secondary | ICD-10-CM | POA: Diagnosis not present

## 2022-12-25 DIAGNOSIS — T632X1A Toxic effect of venom of scorpion, accidental (unintentional), initial encounter: Secondary | ICD-10-CM | POA: Insufficient documentation

## 2022-12-25 DIAGNOSIS — L900 Lichen sclerosus et atrophicus: Secondary | ICD-10-CM

## 2022-12-25 DIAGNOSIS — M6281 Muscle weakness (generalized): Secondary | ICD-10-CM | POA: Diagnosis not present

## 2022-12-25 DIAGNOSIS — R262 Difficulty in walking, not elsewhere classified: Secondary | ICD-10-CM | POA: Diagnosis not present

## 2022-12-25 NOTE — Patient Instructions (Addendum)
Put a pea sized amount of ointment around the opening of the vagina nightly. This will help with itching/irritation. You can apply this ointment yourself. Schedule an internal ultrasound. If the lining of the uterus is very thin, we can say with 99% certainty that there is no cancer or pre cancer in the lining of the uterus Follow up with your primary care doctor to rule out rectal bleeding

## 2022-12-25 NOTE — Progress Notes (Signed)
   NEW GYNECOLOGY VISIT  Subjective:  Andrea Kirk is a 82 y.o. menopausal G0P0000 presenting for postmenopausal bleeding and vulvar irritation  Discussed the use of AI scribe software for clinical note transcription with the patient, who gave verbal consent to proceed.  Reports recent onset of bleeding during urination and bowel movements and blood in her underwear unrelated to BM. Blood in her underwear is usually spots the size of a quarter or smaller. No HVB. Notes some of this started after being roughly cleaned by a CNA and is wondering if that is the cause of her symptoms. Saw urology who prescribed clotrimazole and vaginal estrogen. Has not been evaluated for rectal bleeding that she can recall.   The patient denies any associated pain or constipation but reports difficulty with bowel movements. Does not like using stool softeners.  The patient also reports itching in the genital area, which she attributes to the sensitivity of her skin, as evidenced by a history of eczema and allergic reactions to band-aids.   The patient self-administers her prescribed creams and prefers to manage her own genital care.  No history of abnormal paps. Has never been sexually active. On brilinta, has hx CVA and carotid artery disease. Hx also notable for diastolic HF, vestibular schwannoma and hearing loss, T2DM, HTN, pHTM, and CKD.   Objective:   Vitals:   12/25/22 0853  BP: 103/67  Pulse: 66  Height: 5\' 5"  (1.651 m)   General:  Alert, oriented and cooperative. Patient is in no acute distress.  Skin: Skin is warm and dry. No rash noted.   Cardiovascular: Normal heart rate noted  Respiratory: Normal respiratory effort, no problems with respiration noted  Abdomen: Soft, non-tender, non-distended   Pelvic: Labia majora erythematous over areas that would contact her pull ups. On inner portion of labia majora, skin is pale, thin and atrophic. There is loss of architecture of her labia minora. Small  excoriations are noted.  Uterus non palpable on exam.  Cervix without mass SSE performed, unable to visualize cervix due to patient pain but no blood in vaginal vault.   Exam performed in the presence of a chaperone  Assessment and Plan:  Andrea Kirk is a 82 y.o. with PMB and suspected lichen sclerosus  PMB (postmenopausal bleeding) Vaginal irritation Reviewed ddx including malignancy, hyperplasia, polyp, atrophy, skin changes and non gyn causes that can be confused for vaginal bleeding like rectal bleeding No obvious etiology of bleeding on her exam today Pelvic US to assess uterine lining  PCP follow up for possible rectal bleeding -     US PELVIC COMPLETE WITH TRANSVAGINAL; Future -     Cervicovaginal ancillary only( [place])  Lichen sclerosus Paper rx given for clobetasol ointment nightly x 12 weeks, patient to self administer Biopsy if no improvement with steroids  Return in about 4 weeks (around 01/22/2023) for follow up after ultrasound.  Future Appointments  Date Time Provider Department Center  01/06/2023  3:00 PM [facility] [place] 1 [facility]  01/22/2023  1:30 PM Lennart Pall, MD [facility]  01/31/2023  2:00 PM [facility] HS VASC 1 [facility]  01/31/2023  3:00 PM [facility]  02/04/2023 11:15 AM Dyann Kief, PA-C [facility]  03/11/2023  1:45 PM Freddie Breech, DPM [facility]   Lennart Pall, MD

## 2022-12-26 DIAGNOSIS — I2510 Atherosclerotic heart disease of native coronary artery without angina pectoris: Secondary | ICD-10-CM | POA: Diagnosis not present

## 2022-12-26 DIAGNOSIS — R262 Difficulty in walking, not elsewhere classified: Secondary | ICD-10-CM | POA: Diagnosis not present

## 2022-12-26 DIAGNOSIS — R2689 Other abnormalities of gait and mobility: Secondary | ICD-10-CM | POA: Diagnosis not present

## 2022-12-26 DIAGNOSIS — M62838 Other muscle spasm: Secondary | ICD-10-CM | POA: Diagnosis not present

## 2022-12-26 DIAGNOSIS — A419 Sepsis, unspecified organism: Secondary | ICD-10-CM | POA: Diagnosis not present

## 2022-12-26 DIAGNOSIS — R609 Edema, unspecified: Secondary | ICD-10-CM | POA: Diagnosis not present

## 2022-12-26 DIAGNOSIS — I5032 Chronic diastolic (congestive) heart failure: Secondary | ICD-10-CM | POA: Diagnosis not present

## 2022-12-26 DIAGNOSIS — R52 Pain, unspecified: Secondary | ICD-10-CM | POA: Diagnosis not present

## 2022-12-26 DIAGNOSIS — R1312 Dysphagia, oropharyngeal phase: Secondary | ICD-10-CM | POA: Diagnosis not present

## 2022-12-26 DIAGNOSIS — K219 Gastro-esophageal reflux disease without esophagitis: Secondary | ICD-10-CM | POA: Diagnosis not present

## 2022-12-26 DIAGNOSIS — M6281 Muscle weakness (generalized): Secondary | ICD-10-CM | POA: Diagnosis not present

## 2022-12-26 DIAGNOSIS — S42121S Displaced fracture of acromial process, right shoulder, sequela: Secondary | ICD-10-CM | POA: Diagnosis not present

## 2022-12-26 LAB — CERVICOVAGINAL ANCILLARY ONLY
Candida Glabrata: POSITIVE — AB
Candida Vaginitis: POSITIVE — AB
Chlamydia: NEGATIVE
Comment: NEGATIVE
Comment: NEGATIVE
Comment: NEGATIVE
Comment: NEGATIVE
Comment: NORMAL
Neisseria Gonorrhea: NEGATIVE
Trichomonas: NEGATIVE

## 2022-12-27 DIAGNOSIS — R262 Difficulty in walking, not elsewhere classified: Secondary | ICD-10-CM | POA: Diagnosis not present

## 2022-12-27 DIAGNOSIS — M6281 Muscle weakness (generalized): Secondary | ICD-10-CM | POA: Diagnosis not present

## 2022-12-27 MED ORDER — FLUCONAZOLE 150 MG PO TABS: 150.0000 mg | ORAL_TABLET | Freq: Once | ORAL | 0 refills | Status: AC

## 2022-12-27 NOTE — Addendum Note (Signed)
Addended by: Harvie Bridge on: 12/27/2022 10:34 AM   Modules accepted: Orders

## 2022-12-28 DIAGNOSIS — R1312 Dysphagia, oropharyngeal phase: Secondary | ICD-10-CM | POA: Diagnosis not present

## 2022-12-28 DIAGNOSIS — I2510 Atherosclerotic heart disease of native coronary artery without angina pectoris: Secondary | ICD-10-CM | POA: Diagnosis not present

## 2022-12-28 DIAGNOSIS — K219 Gastro-esophageal reflux disease without esophagitis: Secondary | ICD-10-CM | POA: Diagnosis not present

## 2022-12-28 DIAGNOSIS — I5032 Chronic diastolic (congestive) heart failure: Secondary | ICD-10-CM | POA: Diagnosis not present

## 2022-12-28 DIAGNOSIS — A419 Sepsis, unspecified organism: Secondary | ICD-10-CM | POA: Diagnosis not present

## 2022-12-28 DIAGNOSIS — M6281 Muscle weakness (generalized): Secondary | ICD-10-CM | POA: Diagnosis not present

## 2022-12-28 DIAGNOSIS — R609 Edema, unspecified: Secondary | ICD-10-CM | POA: Diagnosis not present

## 2022-12-28 DIAGNOSIS — S42121S Displaced fracture of acromial process, right shoulder, sequela: Secondary | ICD-10-CM | POA: Diagnosis not present

## 2022-12-28 DIAGNOSIS — R52 Pain, unspecified: Secondary | ICD-10-CM | POA: Diagnosis not present

## 2022-12-28 DIAGNOSIS — R2689 Other abnormalities of gait and mobility: Secondary | ICD-10-CM | POA: Diagnosis not present

## 2022-12-28 DIAGNOSIS — M62838 Other muscle spasm: Secondary | ICD-10-CM | POA: Diagnosis not present

## 2022-12-30 ENCOUNTER — Telehealth: Payer: Self-pay

## 2022-12-30 ENCOUNTER — Telehealth: Payer: Self-pay | Admitting: *Deleted

## 2022-12-30 DIAGNOSIS — M6281 Muscle weakness (generalized): Secondary | ICD-10-CM | POA: Diagnosis not present

## 2022-12-30 DIAGNOSIS — R262 Difficulty in walking, not elsewhere classified: Secondary | ICD-10-CM | POA: Diagnosis not present

## 2022-12-30 NOTE — Telephone Encounter (Cosign Needed)
Pt called stating that she didn't understand about the message that Hillis Range, CMA left on voicemail.  I told her she should have a RX at her pharmacy for yeast med.  She said she uses the pharmacy through [facility].  She was also given a RX for Clobetasol when she was here but doesn't know what she did with it.  I have faxed a RX for Diflucan and Clobetasol ointment to the Nursing facility @ fax # [phone] Attn:Rhonda.  Pt is to let us know if the Diflucan doesn't work and Dr Berton Lan will send in something different.

## 2022-12-30 NOTE — Telephone Encounter (Addendum)
Left message on pt's voicemail with information below.   ----- Message from Lennart Pall sent at 12/27/2022 10:34 AM EST ----- I'm going to send a prescription for diflucan to the listed pharmacy. If symptoms improve, she will not need extra treatment for the candida glabrata. If they do not improve, we can try boric acid. Will you call her nursing home and let them know?  Thanks - KF

## 2022-12-31 DIAGNOSIS — M62838 Other muscle spasm: Secondary | ICD-10-CM | POA: Diagnosis not present

## 2022-12-31 DIAGNOSIS — R609 Edema, unspecified: Secondary | ICD-10-CM | POA: Diagnosis not present

## 2022-12-31 DIAGNOSIS — R262 Difficulty in walking, not elsewhere classified: Secondary | ICD-10-CM | POA: Diagnosis not present

## 2022-12-31 DIAGNOSIS — A419 Sepsis, unspecified organism: Secondary | ICD-10-CM | POA: Diagnosis not present

## 2022-12-31 DIAGNOSIS — S42121S Displaced fracture of acromial process, right shoulder, sequela: Secondary | ICD-10-CM | POA: Diagnosis not present

## 2022-12-31 DIAGNOSIS — I2510 Atherosclerotic heart disease of native coronary artery without angina pectoris: Secondary | ICD-10-CM | POA: Diagnosis not present

## 2022-12-31 DIAGNOSIS — R52 Pain, unspecified: Secondary | ICD-10-CM | POA: Diagnosis not present

## 2022-12-31 DIAGNOSIS — M6281 Muscle weakness (generalized): Secondary | ICD-10-CM | POA: Diagnosis not present

## 2022-12-31 DIAGNOSIS — R1312 Dysphagia, oropharyngeal phase: Secondary | ICD-10-CM | POA: Diagnosis not present

## 2022-12-31 DIAGNOSIS — R2689 Other abnormalities of gait and mobility: Secondary | ICD-10-CM | POA: Diagnosis not present

## 2022-12-31 DIAGNOSIS — I5032 Chronic diastolic (congestive) heart failure: Secondary | ICD-10-CM | POA: Diagnosis not present

## 2022-12-31 DIAGNOSIS — K219 Gastro-esophageal reflux disease without esophagitis: Secondary | ICD-10-CM | POA: Diagnosis not present

## 2023-01-02 DIAGNOSIS — M6281 Muscle weakness (generalized): Secondary | ICD-10-CM | POA: Diagnosis not present

## 2023-01-02 DIAGNOSIS — R262 Difficulty in walking, not elsewhere classified: Secondary | ICD-10-CM | POA: Diagnosis not present

## 2023-01-03 DIAGNOSIS — M6281 Muscle weakness (generalized): Secondary | ICD-10-CM | POA: Diagnosis not present

## 2023-01-03 DIAGNOSIS — I2510 Atherosclerotic heart disease of native coronary artery without angina pectoris: Secondary | ICD-10-CM | POA: Diagnosis not present

## 2023-01-03 DIAGNOSIS — R52 Pain, unspecified: Secondary | ICD-10-CM | POA: Diagnosis not present

## 2023-01-03 DIAGNOSIS — I5032 Chronic diastolic (congestive) heart failure: Secondary | ICD-10-CM | POA: Diagnosis not present

## 2023-01-03 DIAGNOSIS — S42121S Displaced fracture of acromial process, right shoulder, sequela: Secondary | ICD-10-CM | POA: Diagnosis not present

## 2023-01-03 DIAGNOSIS — K219 Gastro-esophageal reflux disease without esophagitis: Secondary | ICD-10-CM | POA: Diagnosis not present

## 2023-01-03 DIAGNOSIS — A419 Sepsis, unspecified organism: Secondary | ICD-10-CM | POA: Diagnosis not present

## 2023-01-03 DIAGNOSIS — M62838 Other muscle spasm: Secondary | ICD-10-CM | POA: Diagnosis not present

## 2023-01-03 DIAGNOSIS — R609 Edema, unspecified: Secondary | ICD-10-CM | POA: Diagnosis not present

## 2023-01-03 DIAGNOSIS — R2689 Other abnormalities of gait and mobility: Secondary | ICD-10-CM | POA: Diagnosis not present

## 2023-01-03 DIAGNOSIS — R1312 Dysphagia, oropharyngeal phase: Secondary | ICD-10-CM | POA: Diagnosis not present

## 2023-01-03 DIAGNOSIS — R262 Difficulty in walking, not elsewhere classified: Secondary | ICD-10-CM | POA: Diagnosis not present

## 2023-01-06 ENCOUNTER — Ambulatory Visit: Payer: 59

## 2023-01-06 DIAGNOSIS — K219 Gastro-esophageal reflux disease without esophagitis: Secondary | ICD-10-CM | POA: Diagnosis not present

## 2023-01-06 DIAGNOSIS — I2510 Atherosclerotic heart disease of native coronary artery without angina pectoris: Secondary | ICD-10-CM | POA: Diagnosis not present

## 2023-01-06 DIAGNOSIS — R52 Pain, unspecified: Secondary | ICD-10-CM | POA: Diagnosis not present

## 2023-01-06 DIAGNOSIS — R9389 Abnormal findings on diagnostic imaging of other specified body structures: Secondary | ICD-10-CM | POA: Diagnosis not present

## 2023-01-06 DIAGNOSIS — M6281 Muscle weakness (generalized): Secondary | ICD-10-CM | POA: Diagnosis not present

## 2023-01-06 DIAGNOSIS — M62838 Other muscle spasm: Secondary | ICD-10-CM | POA: Diagnosis not present

## 2023-01-06 DIAGNOSIS — S42121S Displaced fracture of acromial process, right shoulder, sequela: Secondary | ICD-10-CM | POA: Diagnosis not present

## 2023-01-06 DIAGNOSIS — R2689 Other abnormalities of gait and mobility: Secondary | ICD-10-CM | POA: Diagnosis not present

## 2023-01-06 DIAGNOSIS — R1312 Dysphagia, oropharyngeal phase: Secondary | ICD-10-CM | POA: Diagnosis not present

## 2023-01-06 DIAGNOSIS — N950 Postmenopausal bleeding: Secondary | ICD-10-CM

## 2023-01-06 DIAGNOSIS — A419 Sepsis, unspecified organism: Secondary | ICD-10-CM | POA: Diagnosis not present

## 2023-01-06 DIAGNOSIS — I5032 Chronic diastolic (congestive) heart failure: Secondary | ICD-10-CM | POA: Diagnosis not present

## 2023-01-06 DIAGNOSIS — R609 Edema, unspecified: Secondary | ICD-10-CM | POA: Diagnosis not present

## 2023-01-07 DIAGNOSIS — M6281 Muscle weakness (generalized): Secondary | ICD-10-CM | POA: Diagnosis not present

## 2023-01-07 DIAGNOSIS — R262 Difficulty in walking, not elsewhere classified: Secondary | ICD-10-CM | POA: Diagnosis not present

## 2023-01-09 DIAGNOSIS — M6281 Muscle weakness (generalized): Secondary | ICD-10-CM | POA: Diagnosis not present

## 2023-01-09 DIAGNOSIS — R1314 Dysphagia, pharyngoesophageal phase: Secondary | ICD-10-CM | POA: Diagnosis not present

## 2023-01-09 DIAGNOSIS — R262 Difficulty in walking, not elsewhere classified: Secondary | ICD-10-CM | POA: Diagnosis not present

## 2023-01-10 DIAGNOSIS — M6281 Muscle weakness (generalized): Secondary | ICD-10-CM | POA: Diagnosis not present

## 2023-01-10 DIAGNOSIS — R262 Difficulty in walking, not elsewhere classified: Secondary | ICD-10-CM | POA: Diagnosis not present

## 2023-01-10 DIAGNOSIS — R1314 Dysphagia, pharyngoesophageal phase: Secondary | ICD-10-CM | POA: Diagnosis not present

## 2023-01-11 DIAGNOSIS — M6281 Muscle weakness (generalized): Secondary | ICD-10-CM | POA: Diagnosis not present

## 2023-01-11 DIAGNOSIS — R262 Difficulty in walking, not elsewhere classified: Secondary | ICD-10-CM | POA: Diagnosis not present

## 2023-01-11 DIAGNOSIS — R1314 Dysphagia, pharyngoesophageal phase: Secondary | ICD-10-CM | POA: Diagnosis not present

## 2023-01-14 DIAGNOSIS — D333 Benign neoplasm of cranial nerves: Secondary | ICD-10-CM | POA: Diagnosis not present

## 2023-01-15 DIAGNOSIS — R262 Difficulty in walking, not elsewhere classified: Secondary | ICD-10-CM | POA: Diagnosis not present

## 2023-01-15 DIAGNOSIS — R609 Edema, unspecified: Secondary | ICD-10-CM | POA: Diagnosis not present

## 2023-01-15 DIAGNOSIS — R1314 Dysphagia, pharyngoesophageal phase: Secondary | ICD-10-CM | POA: Diagnosis not present

## 2023-01-15 DIAGNOSIS — K219 Gastro-esophageal reflux disease without esophagitis: Secondary | ICD-10-CM | POA: Diagnosis not present

## 2023-01-15 DIAGNOSIS — A419 Sepsis, unspecified organism: Secondary | ICD-10-CM | POA: Diagnosis not present

## 2023-01-15 DIAGNOSIS — R1312 Dysphagia, oropharyngeal phase: Secondary | ICD-10-CM | POA: Diagnosis not present

## 2023-01-15 DIAGNOSIS — R2689 Other abnormalities of gait and mobility: Secondary | ICD-10-CM | POA: Diagnosis not present

## 2023-01-15 DIAGNOSIS — I2510 Atherosclerotic heart disease of native coronary artery without angina pectoris: Secondary | ICD-10-CM | POA: Diagnosis not present

## 2023-01-15 DIAGNOSIS — S42121S Displaced fracture of acromial process, right shoulder, sequela: Secondary | ICD-10-CM | POA: Diagnosis not present

## 2023-01-15 DIAGNOSIS — I5032 Chronic diastolic (congestive) heart failure: Secondary | ICD-10-CM | POA: Diagnosis not present

## 2023-01-15 DIAGNOSIS — M6281 Muscle weakness (generalized): Secondary | ICD-10-CM | POA: Diagnosis not present

## 2023-01-15 DIAGNOSIS — R52 Pain, unspecified: Secondary | ICD-10-CM | POA: Diagnosis not present

## 2023-01-15 DIAGNOSIS — M62838 Other muscle spasm: Secondary | ICD-10-CM | POA: Diagnosis not present

## 2023-01-16 DIAGNOSIS — M6281 Muscle weakness (generalized): Secondary | ICD-10-CM | POA: Diagnosis not present

## 2023-01-16 DIAGNOSIS — E119 Type 2 diabetes mellitus without complications: Secondary | ICD-10-CM | POA: Diagnosis not present

## 2023-01-16 DIAGNOSIS — R1314 Dysphagia, pharyngoesophageal phase: Secondary | ICD-10-CM | POA: Diagnosis not present

## 2023-01-16 DIAGNOSIS — R262 Difficulty in walking, not elsewhere classified: Secondary | ICD-10-CM | POA: Diagnosis not present

## 2023-01-17 DIAGNOSIS — R262 Difficulty in walking, not elsewhere classified: Secondary | ICD-10-CM | POA: Diagnosis not present

## 2023-01-17 DIAGNOSIS — R1314 Dysphagia, pharyngoesophageal phase: Secondary | ICD-10-CM | POA: Diagnosis not present

## 2023-01-17 DIAGNOSIS — M6281 Muscle weakness (generalized): Secondary | ICD-10-CM | POA: Diagnosis not present

## 2023-01-18 DIAGNOSIS — R1312 Dysphagia, oropharyngeal phase: Secondary | ICD-10-CM | POA: Diagnosis not present

## 2023-01-18 DIAGNOSIS — I2510 Atherosclerotic heart disease of native coronary artery without angina pectoris: Secondary | ICD-10-CM | POA: Diagnosis not present

## 2023-01-18 DIAGNOSIS — M62838 Other muscle spasm: Secondary | ICD-10-CM | POA: Diagnosis not present

## 2023-01-18 DIAGNOSIS — K219 Gastro-esophageal reflux disease without esophagitis: Secondary | ICD-10-CM | POA: Diagnosis not present

## 2023-01-18 DIAGNOSIS — R52 Pain, unspecified: Secondary | ICD-10-CM | POA: Diagnosis not present

## 2023-01-18 DIAGNOSIS — R609 Edema, unspecified: Secondary | ICD-10-CM | POA: Diagnosis not present

## 2023-01-18 DIAGNOSIS — S42121S Displaced fracture of acromial process, right shoulder, sequela: Secondary | ICD-10-CM | POA: Diagnosis not present

## 2023-01-18 DIAGNOSIS — A419 Sepsis, unspecified organism: Secondary | ICD-10-CM | POA: Diagnosis not present

## 2023-01-18 DIAGNOSIS — I5032 Chronic diastolic (congestive) heart failure: Secondary | ICD-10-CM | POA: Diagnosis not present

## 2023-01-18 DIAGNOSIS — R2689 Other abnormalities of gait and mobility: Secondary | ICD-10-CM | POA: Diagnosis not present

## 2023-01-18 DIAGNOSIS — M6281 Muscle weakness (generalized): Secondary | ICD-10-CM | POA: Diagnosis not present

## 2023-01-20 DIAGNOSIS — S42121S Displaced fracture of acromial process, right shoulder, sequela: Secondary | ICD-10-CM | POA: Diagnosis not present

## 2023-01-20 DIAGNOSIS — I5032 Chronic diastolic (congestive) heart failure: Secondary | ICD-10-CM | POA: Diagnosis not present

## 2023-01-20 DIAGNOSIS — M6281 Muscle weakness (generalized): Secondary | ICD-10-CM | POA: Diagnosis not present

## 2023-01-20 DIAGNOSIS — K219 Gastro-esophageal reflux disease without esophagitis: Secondary | ICD-10-CM | POA: Diagnosis not present

## 2023-01-20 DIAGNOSIS — I2510 Atherosclerotic heart disease of native coronary artery without angina pectoris: Secondary | ICD-10-CM | POA: Diagnosis not present

## 2023-01-20 DIAGNOSIS — A419 Sepsis, unspecified organism: Secondary | ICD-10-CM | POA: Diagnosis not present

## 2023-01-20 DIAGNOSIS — R2689 Other abnormalities of gait and mobility: Secondary | ICD-10-CM | POA: Diagnosis not present

## 2023-01-20 DIAGNOSIS — R52 Pain, unspecified: Secondary | ICD-10-CM | POA: Diagnosis not present

## 2023-01-20 DIAGNOSIS — R1312 Dysphagia, oropharyngeal phase: Secondary | ICD-10-CM | POA: Diagnosis not present

## 2023-01-20 DIAGNOSIS — R609 Edema, unspecified: Secondary | ICD-10-CM | POA: Diagnosis not present

## 2023-01-21 DIAGNOSIS — R1314 Dysphagia, pharyngoesophageal phase: Secondary | ICD-10-CM | POA: Diagnosis not present

## 2023-01-21 DIAGNOSIS — M6281 Muscle weakness (generalized): Secondary | ICD-10-CM | POA: Diagnosis not present

## 2023-01-21 DIAGNOSIS — R262 Difficulty in walking, not elsewhere classified: Secondary | ICD-10-CM | POA: Diagnosis not present

## 2023-01-22 ENCOUNTER — Ambulatory Visit: Payer: 59 | Admitting: Obstetrics and Gynecology

## 2023-01-22 VITALS — BP 126/74 | HR 75 | Ht 65.0 in | Wt 176.0 lb

## 2023-01-22 DIAGNOSIS — M6281 Muscle weakness (generalized): Secondary | ICD-10-CM | POA: Diagnosis not present

## 2023-01-22 DIAGNOSIS — R52 Pain, unspecified: Secondary | ICD-10-CM | POA: Diagnosis not present

## 2023-01-22 DIAGNOSIS — R2689 Other abnormalities of gait and mobility: Secondary | ICD-10-CM | POA: Diagnosis not present

## 2023-01-22 DIAGNOSIS — S42121S Displaced fracture of acromial process, right shoulder, sequela: Secondary | ICD-10-CM | POA: Diagnosis not present

## 2023-01-22 DIAGNOSIS — N950 Postmenopausal bleeding: Secondary | ICD-10-CM

## 2023-01-22 DIAGNOSIS — I2510 Atherosclerotic heart disease of native coronary artery without angina pectoris: Secondary | ICD-10-CM | POA: Diagnosis not present

## 2023-01-22 DIAGNOSIS — R609 Edema, unspecified: Secondary | ICD-10-CM | POA: Diagnosis not present

## 2023-01-22 DIAGNOSIS — A419 Sepsis, unspecified organism: Secondary | ICD-10-CM | POA: Diagnosis not present

## 2023-01-22 DIAGNOSIS — R1312 Dysphagia, oropharyngeal phase: Secondary | ICD-10-CM | POA: Diagnosis not present

## 2023-01-22 DIAGNOSIS — I5032 Chronic diastolic (congestive) heart failure: Secondary | ICD-10-CM | POA: Diagnosis not present

## 2023-01-22 DIAGNOSIS — K219 Gastro-esophageal reflux disease without esophagitis: Secondary | ICD-10-CM | POA: Diagnosis not present

## 2023-01-22 NOTE — Progress Notes (Signed)
   RETURN GYNECOLOGY VISIT  Subjective:  Andrea Kirk is a 83 y.o. menopausal G0P0000 presenting for follow up of PMB and lichen sclerosus  See HPI in office note from 12/25/22. Having vaginal vs rectal bleeding. Lichen sclerosus noted on exam. Planned for pelvic US  and clobetasol ointment.   Today, she reports that she has small less than dime spots on her underwear occasionally. Sometimes has blood per rectum but is unsure if this is rectal bleeding or not. Her itching/vulvar irritation has resolved. She states that her PCP is aware that she has bleeding but she is unsure what other testing has been pursued.   I personally reviewed the following: - Cervicovaginal ancillary 12/25/22 +candida glabrata - Pelvic US  01/06/23 5.4 x 3.8 x 4.6cm uterus w/ 3mm endometrium with a small amount of anechoic fluid with small areas of increased echogenicity. Possible small polyps. Ovaries were not visualized.   No history of abnormal paps. Has never been sexually active. On brilinta , has hx CVA and carotid artery disease. Hx also notable for diastolic HF, vestibular schwannoma and hearing loss, T2DM, HTN, pHTN, and CKD. Lives in nursing home and uses wheelchair to get around.   Objective:   Vitals:   01/22/23 1308  BP: 126/74  Pulse: 75  Weight: 176 lb (79.8 kg)  Height: 5\' 5"  (1.651 m)   General:  Alert, oriented and cooperative. Patient is in no acute distress. Comfortable in wheelchair with oxygen  tank available.   Skin: Skin is warm and dry. No rash noted.   Cardiovascular: Normal heart rate noted  Respiratory: Normal respiratory effort, no problems with respiration noted   Assessment and Plan:  Andrea Kirk is a 83 y.o. with possible postmenopausal bleeding  Possible postmenopausal bleeding - No bleeding seen on prior vaginal exam, deferred exam today. Is is unclear what workup has been pursued for other sources of bleeding, but I will reach out to patient's PCP (Dr. Artemio Kirk) to  clarify history - Pelvic US  with EL 3mm and small amount of fluid. Radiology report suggests possible small polyps but there is no definitive evidence of polyp on my review of the images - Offered endometrial biopsy and reviewed risks/benefits. Given her medical co-morbidities, she would like to defer biopsy.  - Plan for follow up US  In 6-12 months while working up potential rectal bleeding  Return in about 6 months (around 07/22/2023) for pelvic ultrasound.  Total encounter time: 20 minutes  Future Appointments  Date Time Provider Department Center  01/31/2023  2:00 PM [facility] HS VASC 1 [facility]  01/31/2023  3:00 PM [facility]  02/04/2023 11:15 AM Flo Hummingbird, PA-C [facility]  03/11/2023  1:45 PM Andrea Kirk, DPM [facility]   Andrea Marsh, MD

## 2023-01-23 DIAGNOSIS — M6281 Muscle weakness (generalized): Secondary | ICD-10-CM | POA: Diagnosis not present

## 2023-01-23 DIAGNOSIS — K219 Gastro-esophageal reflux disease without esophagitis: Secondary | ICD-10-CM | POA: Diagnosis not present

## 2023-01-23 DIAGNOSIS — R1312 Dysphagia, oropharyngeal phase: Secondary | ICD-10-CM | POA: Diagnosis not present

## 2023-01-23 DIAGNOSIS — R52 Pain, unspecified: Secondary | ICD-10-CM | POA: Diagnosis not present

## 2023-01-23 DIAGNOSIS — I2510 Atherosclerotic heart disease of native coronary artery without angina pectoris: Secondary | ICD-10-CM | POA: Diagnosis not present

## 2023-01-23 DIAGNOSIS — A419 Sepsis, unspecified organism: Secondary | ICD-10-CM | POA: Diagnosis not present

## 2023-01-23 DIAGNOSIS — S42121S Displaced fracture of acromial process, right shoulder, sequela: Secondary | ICD-10-CM | POA: Diagnosis not present

## 2023-01-23 DIAGNOSIS — R2689 Other abnormalities of gait and mobility: Secondary | ICD-10-CM | POA: Diagnosis not present

## 2023-01-23 DIAGNOSIS — R609 Edema, unspecified: Secondary | ICD-10-CM | POA: Diagnosis not present

## 2023-01-24 ENCOUNTER — Other Ambulatory Visit: Payer: Self-pay

## 2023-01-24 DIAGNOSIS — A419 Sepsis, unspecified organism: Secondary | ICD-10-CM | POA: Diagnosis not present

## 2023-01-24 DIAGNOSIS — R262 Difficulty in walking, not elsewhere classified: Secondary | ICD-10-CM | POA: Diagnosis not present

## 2023-01-24 DIAGNOSIS — M6281 Muscle weakness (generalized): Secondary | ICD-10-CM | POA: Diagnosis not present

## 2023-01-24 DIAGNOSIS — I5032 Chronic diastolic (congestive) heart failure: Secondary | ICD-10-CM | POA: Diagnosis not present

## 2023-01-24 DIAGNOSIS — K219 Gastro-esophageal reflux disease without esophagitis: Secondary | ICD-10-CM | POA: Diagnosis not present

## 2023-01-24 DIAGNOSIS — I2510 Atherosclerotic heart disease of native coronary artery without angina pectoris: Secondary | ICD-10-CM | POA: Diagnosis not present

## 2023-01-24 DIAGNOSIS — S42121S Displaced fracture of acromial process, right shoulder, sequela: Secondary | ICD-10-CM | POA: Diagnosis not present

## 2023-01-24 DIAGNOSIS — R609 Edema, unspecified: Secondary | ICD-10-CM | POA: Diagnosis not present

## 2023-01-24 DIAGNOSIS — R52 Pain, unspecified: Secondary | ICD-10-CM | POA: Diagnosis not present

## 2023-01-24 DIAGNOSIS — R1312 Dysphagia, oropharyngeal phase: Secondary | ICD-10-CM | POA: Diagnosis not present

## 2023-01-24 DIAGNOSIS — R2689 Other abnormalities of gait and mobility: Secondary | ICD-10-CM | POA: Diagnosis not present

## 2023-01-24 DIAGNOSIS — R1314 Dysphagia, pharyngoesophageal phase: Secondary | ICD-10-CM | POA: Diagnosis not present

## 2023-01-24 DIAGNOSIS — I6523 Occlusion and stenosis of bilateral carotid arteries: Secondary | ICD-10-CM

## 2023-01-27 DIAGNOSIS — R1314 Dysphagia, pharyngoesophageal phase: Secondary | ICD-10-CM | POA: Diagnosis not present

## 2023-01-27 DIAGNOSIS — M6281 Muscle weakness (generalized): Secondary | ICD-10-CM | POA: Diagnosis not present

## 2023-01-27 DIAGNOSIS — R262 Difficulty in walking, not elsewhere classified: Secondary | ICD-10-CM | POA: Diagnosis not present

## 2023-01-28 DIAGNOSIS — M6281 Muscle weakness (generalized): Secondary | ICD-10-CM | POA: Diagnosis not present

## 2023-01-28 DIAGNOSIS — R262 Difficulty in walking, not elsewhere classified: Secondary | ICD-10-CM | POA: Diagnosis not present

## 2023-01-28 DIAGNOSIS — R1314 Dysphagia, pharyngoesophageal phase: Secondary | ICD-10-CM | POA: Diagnosis not present

## 2023-01-29 DIAGNOSIS — R1314 Dysphagia, pharyngoesophageal phase: Secondary | ICD-10-CM | POA: Diagnosis not present

## 2023-01-29 DIAGNOSIS — R262 Difficulty in walking, not elsewhere classified: Secondary | ICD-10-CM | POA: Diagnosis not present

## 2023-01-29 DIAGNOSIS — M6281 Muscle weakness (generalized): Secondary | ICD-10-CM | POA: Diagnosis not present

## 2023-01-30 DIAGNOSIS — M6281 Muscle weakness (generalized): Secondary | ICD-10-CM | POA: Diagnosis not present

## 2023-01-30 DIAGNOSIS — R1314 Dysphagia, pharyngoesophageal phase: Secondary | ICD-10-CM | POA: Diagnosis not present

## 2023-01-30 DIAGNOSIS — R262 Difficulty in walking, not elsewhere classified: Secondary | ICD-10-CM | POA: Diagnosis not present

## 2023-01-30 NOTE — Progress Notes (Signed)
Cardiology Office Note:  .   Date:  02/04/2023  ID:  Justin Mend Carrozza, DOB December 22, 1940, MRN [id number] PCP: Clarene Duke, DO  [facility] Providers Cardiologist:  Lance Muss, MD    History of Present Illness: .   Andrea Kirk is a 83 y.o. female  with  history of CAD s/p CABG 1997, RCE 2018, HTN, DM, chronic diastolic CHF, multiple falls, sepsis 02/2021 with echo normal LVEF mild LVH.     I saw her 05/2022 and she was doing well.  Patient comes in from a facility alone. She says she is needing to use the Oxygen more and doesn't understand why. Her legs are a little swollen. Says the food is very salty at the facility but also gets outside food brought in-Dominoes, [employer], friends cooking. Has had some vaginal bleeding but is improving and being followed by gynecologist in [place].Was told she she had vaginal wall bleeding.    ROS:    Studies Reviewed: Marland Kitchen         Prior CV Studies:    Carotid dopplers 01/31/2022 Summary:  Right Carotid: Velocities in the right ICA are consistent with a 1-39%  stenosis.                The ECA appears >50% stenosed.   Left Carotid: Velocities in the left ICA are consistent with a 40-59%  stenosis.   Vertebrals: Bilateral vertebral arteries demonstrate antegrade flow.  Subclavians: Left subclavian artery flow was disturbed. Normal flow  hemodynamics              were seen in bilateral subclavian arteries.   *See table(s) above for measurements and observations.      Echo in 2/23  showed: "Left ventricular ejection fraction, by estimation, is 50 to 55%. The  left ventricle has low normal function. The left ventricle has no regional  wall motion abnormalities. There is mild concentric left ventricular  hypertrophy. Left ventricular  diastolic parameters are consistent with Grade III diastolic dysfunction  (restrictive). Elevated left ventricular end-diastolic pressure.   2. Right ventricular systolic function is normal. The  right ventricular  size is mildly enlarged. There is severely elevated pulmonary artery  systolic pressure. The estimated right ventricular systolic pressure is  60.1 mmHg.   3. Left atrial size was severely dilated.   4. Right atrial size was moderately dilated.   5. The mitral valve is grossly normal. Mild mitral valve regurgitation.  No evidence of mitral stenosis. Moderate mitral annular calcification.   6. Tricuspid valve regurgitation is moderate.   7. The aortic valve is tricuspid. There is mild calcification of the  aortic valve. There is mild thickening of the aortic valve. Aortic valve  regurgitation is not visualized. Aortic valve sclerosis/calcification is  present, without any evidence of  aortic stenosis.   8. The inferior vena cava is normal in size with <50% respiratory  variability, suggesting right atrial pressure of 8 mmHg."  Risk Assessment/Calculations:             Physical Exam:   VS:  BP 137/60   Pulse (!) 54   Ht 5\' 5"  (1.651 m)   Wt 176 lb (79.8 kg)   SpO2 91% Comment: on 2 L  BMI 29.29 kg/m    Wt Readings from Last 3 Encounters:  02/04/23 176 lb (79.8 kg)  01/22/23 176 lb (79.8 kg)  05/28/22 176 lb 5.9 oz (80 kg)    GEN: Well nourished, well developed in  no acute distress NECK: No JVD; No carotid bruits CARDIAC:  RRR, S4 2/6 systolic murmur LSB RESPIRATORY:  decreased breath sounds throughout without rales, wheezing or rhonchi  ABDOMEN: Soft, non-tender, non-distended EXTREMITIES:  mild bilateral edema; No deformity   ASSESSMENT AND PLAN: .   CAD S/P CABG 1997: No angina. Continue aggressive secondary prevention. On Brilinata and ASA-some vaginal bleeding recently  but has improved and being followed by gynecologist.   Carotid artery disease: 01/2022: LICA 40-59% RICA 1-39% Followed at [facility]. Continue statin.  rosuvastatin 20 mg daily.    Falls: Care to avoid falls.  No recent falls.   DM: A1C 6.5-earlier this month-she showed me on her phone,  done at the facility.   Chronic diastolic heart failure: with some mild edema and increase O2 requirements.was on Lasix 20 mg prn but not on facilty med list and she says she hasn't gotten it. Will have her take lasix 20 mg daily for 3 days then prn for edema/weight gain 2-3 lbs overnight. 2 gm sodium diet.  -labs reviewed on her phone and Crt 1.1 Jan 16 2023 other labs stable.   HTN: well controlled;  continue present plan and medications-valsartan, amlodipine, coreg, lasix              Dispo: f/u in 6 months.  Signed, Jacolyn Reedy, PA-C

## 2023-01-31 ENCOUNTER — Ambulatory Visit (HOSPITAL_COMMUNITY)
Admission: RE | Admit: 2023-01-31 | Discharge: 2023-01-31 | Disposition: A | Discharge status: Home / Self Care | Payer: 59 | Source: Ambulatory Visit | Attending: Vascular Surgery

## 2023-01-31 ENCOUNTER — Ambulatory Visit (INDEPENDENT_AMBULATORY_CARE_PROVIDER_SITE_OTHER): Payer: 59 | Admitting: Physician Assistant

## 2023-01-31 VITALS — BP 117/74 | HR 55 | Temp 98.0°F | Resp 90 | Ht 65.0 in

## 2023-01-31 DIAGNOSIS — R2689 Other abnormalities of gait and mobility: Secondary | ICD-10-CM | POA: Diagnosis not present

## 2023-01-31 DIAGNOSIS — I5032 Chronic diastolic (congestive) heart failure: Secondary | ICD-10-CM | POA: Diagnosis not present

## 2023-01-31 DIAGNOSIS — I6523 Occlusion and stenosis of bilateral carotid arteries: Secondary | ICD-10-CM | POA: Insufficient documentation

## 2023-01-31 DIAGNOSIS — R1314 Dysphagia, pharyngoesophageal phase: Secondary | ICD-10-CM | POA: Diagnosis not present

## 2023-01-31 DIAGNOSIS — A419 Sepsis, unspecified organism: Secondary | ICD-10-CM | POA: Diagnosis not present

## 2023-01-31 DIAGNOSIS — R262 Difficulty in walking, not elsewhere classified: Secondary | ICD-10-CM | POA: Diagnosis not present

## 2023-01-31 DIAGNOSIS — S42121S Displaced fracture of acromial process, right shoulder, sequela: Secondary | ICD-10-CM | POA: Diagnosis not present

## 2023-01-31 DIAGNOSIS — R1312 Dysphagia, oropharyngeal phase: Secondary | ICD-10-CM | POA: Diagnosis not present

## 2023-01-31 DIAGNOSIS — R52 Pain, unspecified: Secondary | ICD-10-CM | POA: Diagnosis not present

## 2023-01-31 DIAGNOSIS — M6281 Muscle weakness (generalized): Secondary | ICD-10-CM | POA: Diagnosis not present

## 2023-01-31 DIAGNOSIS — K219 Gastro-esophageal reflux disease without esophagitis: Secondary | ICD-10-CM | POA: Diagnosis not present

## 2023-01-31 DIAGNOSIS — R609 Edema, unspecified: Secondary | ICD-10-CM | POA: Diagnosis not present

## 2023-01-31 DIAGNOSIS — I2510 Atherosclerotic heart disease of native coronary artery without angina pectoris: Secondary | ICD-10-CM | POA: Diagnosis not present

## 2023-01-31 NOTE — Progress Notes (Signed)
Office Note     CC:  follow up Requesting Provider:  Georgann Housekeeper, MD  HPI: Andrea Kirk is a 83 y.o. (1940-05-28) female who presents four routine follow up of carotid artery stenosis. She has remote history of left CEA on 04/16/13 and right CEA on 03/22/16 by Dr. Arbie Cookey. Both were performed secondary to asymptomatic high grade stenosis.   She had her brother who is a retired Clinical research associate on the speaker phone for today's visit. She reports no major changes in her medical history since her last visit. She is being evaluated by Otolaryngologist for some right sided hearing loss. She explains she just underwent MRI but does not have results. She also is seeing ENT for some difficulty with swallowing certain foods. Feeling like the food gets stuck. She otherwise denies any visual changes or amaurosis, no slurred speech, facial drooping, unilateral upper or lower extremity weakness or numbness.She is medically managed on Aspirin and statin. She denies any pain in her legs on rest or ambulation. She does walk with walker or pushing her wheel chair but she mostly is in wheel chair.   Past Medical History:  Diagnosis Date   Acute kidney injury (HCC) 08/05/2016   Aftercare following surgery of the circulatory system, NEC 05/11/2013   AKI (acute kidney injury) (HCC) 08/04/2016   Anxiety    takes Celexa daily   ARF (acute renal failure) (HCC) 11/07/2016   Asymptomatic stenosis of right carotid artery 03/22/2016   Back pain    occasionally   Carotid artery disease (HCC) 04/16/2013   Carotid artery occlusion    Carotid stenosis 11/16/2013   Cataract    left and immature   Coronary artery disease    Coronary atherosclerosis of native coronary artery 03/11/2013   S/p CABG in 1997    Depression    Diabetes mellitus    takes Metformin and Glipizide daily   Diabetes mellitus (HCC) 05/02/2015   Dizziness    takes Meclizine daily as needed   Essential hypertension, benign 03/11/2013   GERD (gastroesophageal  reflux disease)    takes Omeprazole daily as needed   Headache(784.0)    Hyperlipidemia    takes Atorvastatin daily   Hypertension    takes Carvedilol daily   Mixed hyperlipidemia 03/11/2013   Muscle spasm    takes Robaxin daily as needed   Nausea    takes Phenergan daily as needed   Occlusion and stenosis of carotid artery without mention of cerebral infarction 07/19/2011   Pneumonia    hx of-in high school   Restless leg    takes Requip daily as needed   Seasonal allergies    takes Claritin daily as needed and Afrin as needed   Shortness of breath    with exertion   Urinary urgency    UTI (urinary tract infection) 11/07/2016    Past Surgical History:  Procedure Laterality Date   COLONOSCOPY WITH PROPOFOL N/A 03/10/2012   Procedure: COLONOSCOPY WITH PROPOFOL;  Surgeon: Charolett Bumpers, MD;  Location: [facility];  Service: Endoscopy;  Laterality: N/A;   CORNEAL TRANSPLANT Right    CORONARY ARTERY BYPASS GRAFT  1997   x 6   CORONARY ARTERY BYPASS GRAFT  Jan. 1997   ENDARTERECTOMY Left 04/16/2013   Procedure: Left Carotid Artery Endatarectomy with Resection of Redundant Internal Carotid Artery;  Surgeon: Larina Earthly, MD;  Location: [facility] OR;  Service: Vascular;  Laterality: Left;   ENDARTERECTOMY Right 03/22/2016   Procedure: RIGHT ENDARTERECTOMY CAROTID;  Surgeon:  Larina Earthly, MD;  Location: [facility] OR;  Service: Vascular;  Laterality: Right;   ESOPHAGOGASTRODUODENOSCOPY N/A 03/10/2012   Procedure: ESOPHAGOGASTRODUODENOSCOPY (EGD);  Surgeon: Charolett Bumpers, MD;  Location: [facility];  Service: Endoscopy;  Laterality: N/A;   EYE SURGERY  March 12, 2001   CORNEA TRANSPLANT Right eye   PATCH ANGIOPLASTY Right 03/22/2016   Procedure: PATCH ANGIOPLASTY;  Surgeon: Larina Earthly, MD;  Location: [facility] OR;  Service: Vascular;  Laterality: Right;   PR VEIN BYPASS GRAFT,AORTO-FEM-POP  1997   SPINE SURGERY  march 2013   Back surgery   TONSILLECTOMY     TRIGGER FINGER RELEASE Left    thumb     Social History   Socioeconomic History   Marital status: Widowed    Spouse name: Not on file   Number of children: Not on file   Years of education: Not on file   Highest education level: Not on file  Occupational History   Not on file  Tobacco Use   Smoking status: Never   Smokeless tobacco: Never  Vaping Use   Vaping status: Never Used  Substance and Sexual Activity   Alcohol use: No    Alcohol/week: 0.0 standard drinks of alcohol   Drug use: No   Sexual activity: Never    Birth control/protection: Post-menopausal  Other Topics Concern   Not on file  Social History Narrative   Not on file   Social Drivers of Health   Financial Resource Strain: Not on file  Food Insecurity: Not on file  Transportation Needs: Not on file  Physical Activity: Not on file  Stress: Not on file  Social Connections: Not on file  Intimate Partner Violence: Not on file    Family History  Problem Relation Age of Onset   Heart attack Father    Heart disease Father 7       Before age of 60   Hyperlipidemia Father    Heart disease Brother        Heart dissease before age 8   Hyperlipidemia Brother    Cirrhosis Mother    Heart attack Paternal Grandfather    Heart disease Paternal Grandfather    Cancer Maternal Grandmother    Alcoholism Maternal Grandfather    Heart attack Paternal Grandmother     Current Outpatient Medications  Medication Sig Dispense Refill   acetaminophen (TYLENOL) 500 MG tablet Take 1,000 mg by mouth at bedtime.     amLODipine (NORVASC) 10 MG tablet Take 10 mg by mouth daily.     Ascorbic Acid (VITAMIN C) 1000 MG tablet Take 1,000 mg by mouth daily.     aspirin EC 81 MG tablet Take 81 mg by mouth every evening.      busPIRone (BUSPAR) 7.5 MG tablet Take 7.5 mg by mouth 2 (two) times daily.     cephALEXin (KEFLEX) 250 MG capsule Take 250 mg by mouth daily.     citalopram (CELEXA) 20 MG tablet Take 20 mg by mouth daily.     docusate sodium (COLACE) 100 MG  capsule Take 100 mg by mouth 2 (two) times daily.     empagliflozin (JARDIANCE) 25 MG TABS tablet Take 25 mg by mouth daily.     estradiol (ESTRACE) 0.1 MG/GM vaginal cream Place 1 Applicatorful vaginally at bedtime. Twice daily for pain     ferrous sulfate 325 (65 FE) MG tablet Take 325 mg by mouth daily. Monday-Friday     gabapentin (NEURONTIN) 100 MG capsule Take 1  capsule (100 mg total) by mouth 3 (three) times daily.     glipiZIDE (GLUCOTROL XL) 5 MG 24 hr tablet Take 1 tablet (5 mg total) by mouth daily with breakfast. 30 tablet 0   HYDROcodone-acetaminophen (NORCO) 10-325 MG tablet Take 1 tablet by mouth in the morning and at bedtime.     ipratropium-albuterol (DUONEB) 0.5-2.5 (3) MG/3ML SOLN Take 3 mLs by nebulization every 6 (six) hours as needed (shortness of breath).     Magnesium Oxide 250 MG TABS Take 250 mg by mouth daily.     melatonin 3 MG TABS tablet Take 3 mg by mouth at bedtime.     methocarbamol (ROBAXIN) 500 MG tablet Take 1 tablet (500 mg total) by mouth every 8 (eight) hours as needed for muscle spasms. (Patient taking differently: Take 500 mg by mouth in the morning and at bedtime.)     nitroGLYCERIN (NITROSTAT) 0.4 MG SL tablet Place 1 tablet (0.4 mg total) under the tongue every 5 (five) minutes as needed. Chest pain. 25 tablet 5   oxybutynin (DITROPAN-XL) 10 MG 24 hr tablet Take 10 mg by mouth daily.     pantoprazole (PROTONIX) 40 MG tablet Take 40 mg by mouth daily.     Polyethyl Glycol-Propyl Glycol (SYSTANE) 0.4-0.3 % SOLN Place 2 drops into both eyes in the morning, at noon, and at bedtime.     rosuvastatin (CRESTOR) 20 MG tablet Take 1 tablet (20 mg total) by mouth daily. 90 tablet 3   senna (SENOKOT) 8.6 MG tablet Take 2 tablets by mouth at bedtime.     ticagrelor (BRILINTA) 90 MG TABS tablet Take 1 tablet (90 mg total) by mouth 2 (two) times daily. 60 tablet 0   valsartan (DIOVAN) 80 MG tablet Take 80 mg by mouth daily.     calcium carbonate (TUMS EX) 750 MG  chewable tablet Chew 2 tablets by mouth daily as needed for heartburn (with food). (Patient not taking: Reported on 12/25/2022)     carvedilol (COREG) 12.5 MG tablet Take 1 tablet (12.5 mg total) by mouth 2 (two) times daily. 60 tablet 2   CHLORASEPTIC 6-10 MG lozenge Take 1 lozenge by mouth as needed for sore throat (every 2 hours). (Patient not taking: Reported on 12/25/2022)     CRANBERRY PO Take 300 mg by mouth daily. (Patient not taking: Reported on 12/25/2022)     furosemide (LASIX) 20 MG tablet Take 20 mg by mouth daily as needed for edema. (Patient not taking: Reported on 12/25/2022)     HYDROcodone-acetaminophen (NORCO/VICODIN) 5-325 MG tablet Take 1 tablet by mouth daily. At 1pm (Patient not taking: Reported on 12/25/2022)     hydrOXYzine (ATARAX) 25 MG tablet Take 25 mg by mouth every 6 (six) hours as needed. (Patient not taking: Reported on 12/25/2022)     Lactulose 20 GM/30ML SOLN Take 30 mLs by mouth daily as needed (constipation). (Patient not taking: Reported on 12/25/2022)     lidocaine (ASPERCREME LIDOCAINE) 4 % Place 1 patch onto the skin daily. (Patient not taking: Reported on 01/31/2023)     lidocaine (LIDODERM) 5 % Place 1 patch onto the skin daily. Remove & Discard patch within 12 hours or as directed by MD (Patient not taking: Reported on 12/25/2022)     saccharomyces boulardii (FLORASTOR) 250 MG capsule Take 250 mg by mouth daily. (Patient not taking: Reported on 12/25/2022)     vitamin B-12 (CYANOCOBALAMIN) 500 MCG tablet Take 500 mcg by mouth daily. Monday-Friday (Patient not taking: Reported  on 12/25/2022)     No current facility-administered medications for this visit.    Allergies  Allergen Reactions   Latex Other (See Comments), Rash and Hives    tears skin   Codeine Other (See Comments)    Abnormal behavior   Morphine And Codeine Other (See Comments)    Affects BP and blood sugar.   Cyclobenzaprine Other (See Comments)    MADE PT SICK- pt unsure if med was  cyclobenzaprine or methocarbamol   Methocarbamol Other (See Comments)    MADE PT SICK- pt unsure if med was cyclobenzaprine or methocarbamol   11/09/20 pt currently takes methocarbamol  MADE PT SICK- pt unsure if med was cyclobenzaprine or methocarbamol  11/09/20 pt currently takes methocarbamol   Morphine Other (See Comments)    Unknown reaction     REVIEW OF SYSTEMS:   [X]  denotes positive finding, [ ]  denotes negative finding Cardiac  Comments:  Chest pain or chest pressure:    Shortness of breath upon exertion:    Short of breath when lying flat:    Irregular heart rhythm:        Vascular    Pain in calf, thigh, or hip brought on by ambulation:    Pain in feet at night that wakes you up from your sleep:     Blood clot in your veins:    Leg swelling:         Pulmonary    Oxygen at home:    Productive cough:     Wheezing:         Neurologic    Sudden weakness in arms or legs:     Sudden numbness in arms or legs:     Sudden onset of difficulty speaking or slurred speech:    Temporary loss of vision in one eye:     Problems with dizziness:         Gastrointestinal    Blood in stool:     Vomited blood:         Genitourinary    Burning when urinating:     Blood in urine:        Psychiatric    Major depression:         Hematologic    Bleeding problems:    Problems with blood clotting too easily:        Skin    Rashes or ulcers:        Constitutional    Fever or chills:      PHYSICAL EXAMINATION:  Vitals:   01/31/23 1433 01/31/23 1437  BP: 107/65 117/74  Pulse: (!) 55   Resp: (!) 90   Temp: 98 F (36.7 C)   TempSrc: Temporal   Height: 5\' 5"  (1.651 m)     General:  WDWN in NAD; vital signs documented above Gait: in wheel chair HENT: WNL, normocephalic Pulmonary: normal non-labored breathing , without wheezing Cardiac: regular HR Abdomen: soft Vascular Exam/Pulses: feet warm and well perfused 1+ Dp pulses bilaterally Extremities: without  ischemic changes, without Gangrene , without cellulitis; without open wounds;  Musculoskeletal: no muscle wasting or atrophy  Neurologic: A&O X 3 Psychiatric:  The pt has Normal affect.   Non-Invasive Vascular Imaging:   VAS US Carotid Duplex: Summary:  Right Carotid: Velocities in the right ICA are consistent with a 1-39% stenosis.   Left Carotid: Velocities in the left ICA are consistent with a 40-59% stenosis.   Vertebrals: Bilateral vertebral arteries demonstrate antegrade flow.  Subclavians:  Left subclavian artery flow was disturbed. Normal flow hemodynamics were seen in the right subclavian artery.    ASSESSMENT/PLAN:: 83 y.o. female here for follow up for carotid artery stenosis. She remains without any associated neurological symptoms. She has remote history of left CEA on 04/16/13 and right CEA on 03/22/16 by Dr. Arbie Cookey. Both were performed secondary to asymptomatic high grade stenosis. - Duplex today is overall stable. Right ICA with 1-39% stenosis, left ICA with 40-59% stenosis, bilateral vertebral's with antegrade flow.  -Continue Aspirin and Statin - Follow up in 1 year with repeat carotid duplex   Graceann Congress, PA-C [facility] [phone]  On call MD:   Hetty Blend

## 2023-02-01 DIAGNOSIS — I5032 Chronic diastolic (congestive) heart failure: Secondary | ICD-10-CM | POA: Diagnosis not present

## 2023-02-01 DIAGNOSIS — R1312 Dysphagia, oropharyngeal phase: Secondary | ICD-10-CM | POA: Diagnosis not present

## 2023-02-01 DIAGNOSIS — R609 Edema, unspecified: Secondary | ICD-10-CM | POA: Diagnosis not present

## 2023-02-01 DIAGNOSIS — R2689 Other abnormalities of gait and mobility: Secondary | ICD-10-CM | POA: Diagnosis not present

## 2023-02-01 DIAGNOSIS — A419 Sepsis, unspecified organism: Secondary | ICD-10-CM | POA: Diagnosis not present

## 2023-02-01 DIAGNOSIS — R1314 Dysphagia, pharyngoesophageal phase: Secondary | ICD-10-CM | POA: Diagnosis not present

## 2023-02-01 DIAGNOSIS — K219 Gastro-esophageal reflux disease without esophagitis: Secondary | ICD-10-CM | POA: Diagnosis not present

## 2023-02-01 DIAGNOSIS — R52 Pain, unspecified: Secondary | ICD-10-CM | POA: Diagnosis not present

## 2023-02-01 DIAGNOSIS — I2510 Atherosclerotic heart disease of native coronary artery without angina pectoris: Secondary | ICD-10-CM | POA: Diagnosis not present

## 2023-02-01 DIAGNOSIS — R262 Difficulty in walking, not elsewhere classified: Secondary | ICD-10-CM | POA: Diagnosis not present

## 2023-02-01 DIAGNOSIS — M6281 Muscle weakness (generalized): Secondary | ICD-10-CM | POA: Diagnosis not present

## 2023-02-01 DIAGNOSIS — S42121S Displaced fracture of acromial process, right shoulder, sequela: Secondary | ICD-10-CM | POA: Diagnosis not present

## 2023-02-03 DIAGNOSIS — R1312 Dysphagia, oropharyngeal phase: Secondary | ICD-10-CM | POA: Diagnosis not present

## 2023-02-03 DIAGNOSIS — R1314 Dysphagia, pharyngoesophageal phase: Secondary | ICD-10-CM | POA: Diagnosis not present

## 2023-02-03 DIAGNOSIS — I2510 Atherosclerotic heart disease of native coronary artery without angina pectoris: Secondary | ICD-10-CM | POA: Diagnosis not present

## 2023-02-03 DIAGNOSIS — I5032 Chronic diastolic (congestive) heart failure: Secondary | ICD-10-CM | POA: Diagnosis not present

## 2023-02-03 DIAGNOSIS — R52 Pain, unspecified: Secondary | ICD-10-CM | POA: Diagnosis not present

## 2023-02-03 DIAGNOSIS — R609 Edema, unspecified: Secondary | ICD-10-CM | POA: Diagnosis not present

## 2023-02-03 DIAGNOSIS — K219 Gastro-esophageal reflux disease without esophagitis: Secondary | ICD-10-CM | POA: Diagnosis not present

## 2023-02-03 DIAGNOSIS — A419 Sepsis, unspecified organism: Secondary | ICD-10-CM | POA: Diagnosis not present

## 2023-02-03 DIAGNOSIS — M6281 Muscle weakness (generalized): Secondary | ICD-10-CM | POA: Diagnosis not present

## 2023-02-03 DIAGNOSIS — R262 Difficulty in walking, not elsewhere classified: Secondary | ICD-10-CM | POA: Diagnosis not present

## 2023-02-03 DIAGNOSIS — S42121S Displaced fracture of acromial process, right shoulder, sequela: Secondary | ICD-10-CM | POA: Diagnosis not present

## 2023-02-03 DIAGNOSIS — R2689 Other abnormalities of gait and mobility: Secondary | ICD-10-CM | POA: Diagnosis not present

## 2023-02-04 ENCOUNTER — Ambulatory Visit: Payer: 59 | Attending: Physician Assistant | Admitting: Physician Assistant

## 2023-02-04 ENCOUNTER — Encounter: Payer: Self-pay | Admitting: Physician Assistant

## 2023-02-04 VITALS — BP 137/60 | HR 54 | Ht 65.0 in | Wt 176.0 lb

## 2023-02-04 DIAGNOSIS — I5032 Chronic diastolic (congestive) heart failure: Secondary | ICD-10-CM

## 2023-02-04 DIAGNOSIS — R1314 Dysphagia, pharyngoesophageal phase: Secondary | ICD-10-CM | POA: Diagnosis not present

## 2023-02-04 DIAGNOSIS — I779 Disorder of arteries and arterioles, unspecified: Secondary | ICD-10-CM

## 2023-02-04 DIAGNOSIS — A419 Sepsis, unspecified organism: Secondary | ICD-10-CM | POA: Diagnosis not present

## 2023-02-04 DIAGNOSIS — I25119 Atherosclerotic heart disease of native coronary artery with unspecified angina pectoris: Secondary | ICD-10-CM

## 2023-02-04 DIAGNOSIS — E1165 Type 2 diabetes mellitus with hyperglycemia: Secondary | ICD-10-CM | POA: Diagnosis not present

## 2023-02-04 DIAGNOSIS — I2510 Atherosclerotic heart disease of native coronary artery without angina pectoris: Secondary | ICD-10-CM | POA: Diagnosis not present

## 2023-02-04 DIAGNOSIS — R609 Edema, unspecified: Secondary | ICD-10-CM | POA: Diagnosis not present

## 2023-02-04 DIAGNOSIS — R262 Difficulty in walking, not elsewhere classified: Secondary | ICD-10-CM | POA: Diagnosis not present

## 2023-02-04 DIAGNOSIS — I10 Essential (primary) hypertension: Secondary | ICD-10-CM

## 2023-02-04 DIAGNOSIS — R296 Repeated falls: Secondary | ICD-10-CM

## 2023-02-04 DIAGNOSIS — M62838 Other muscle spasm: Secondary | ICD-10-CM | POA: Diagnosis not present

## 2023-02-04 DIAGNOSIS — R1312 Dysphagia, oropharyngeal phase: Secondary | ICD-10-CM | POA: Diagnosis not present

## 2023-02-04 DIAGNOSIS — S42121S Displaced fracture of acromial process, right shoulder, sequela: Secondary | ICD-10-CM | POA: Diagnosis not present

## 2023-02-04 DIAGNOSIS — K219 Gastro-esophageal reflux disease without esophagitis: Secondary | ICD-10-CM | POA: Diagnosis not present

## 2023-02-04 DIAGNOSIS — R2689 Other abnormalities of gait and mobility: Secondary | ICD-10-CM | POA: Diagnosis not present

## 2023-02-04 DIAGNOSIS — M6281 Muscle weakness (generalized): Secondary | ICD-10-CM | POA: Diagnosis not present

## 2023-02-04 DIAGNOSIS — R52 Pain, unspecified: Secondary | ICD-10-CM | POA: Diagnosis not present

## 2023-02-04 MED ORDER — FUROSEMIDE 20 MG PO TABS: ORAL_TABLET | ORAL | 3 refills | Status: DC

## 2023-02-04 NOTE — Patient Instructions (Addendum)
Medication Instructions:  Your physician has recommended you make the following change in your medication:   1) START furosemide (Lasix) 20 mg daily for 3 days then take once daily as needed for weight gain of 2-3 lbs overnight or 5 lbs in 1 week.  *If you need a refill on your cardiac medications before your next appointment, please call your pharmacy*  Lab Work: In 1 week at [facility] (around February 11, 2023): BMET You may also go to any of these [facility] locations:   [facility] - [address] ([facility]) - [address] [phone] [address]   If you have labs (blood work) drawn today and your tests are completely normal, you will receive your results only by: [employer] (if you have MyChart) OR A paper copy in the mail If you have any lab test that is abnormal or we need to change your treatment, we will call you to review the results.  Testing/Procedures: None ordered today.  Follow-Up: At [facility], you and your health needs are our priority.  As part of our continuing mission to provide you with exceptional heart care, we have created designated Provider Care Teams.  These Care Teams include your primary Cardiologist (physician) and Advanced Practice Providers (APPs -  Physician Assistants and Nurse Practitioners) who all work together to provide you with the care you need, when you need it.  Your next appointment:   6 month(s)  The format for your next appointment:   In Person  Provider:   Dr. Epifanio Lesches {  Other Instructions Please wear compression stockings during the day (take off at bedtime) to help with leg swelling. Please also weigh yourself daily, in the morning, and call our office if you have a weight gain of 2-3 lbs overnight or 5 lbs in 1 week.      1st Floor: - Lobby - Registration  - Pharmacy  - Lab - Cafe  2nd Floor: - PV Lab - Diagnostic Testing (echo, CT, nuclear  med)  3rd Floor: - Vacant  4th Floor: - TCTS (cardiothoracic surgery) - AFib Clinic - Structural Heart Clinic - Vascular Surgery  - Vascular Ultrasound  5th Floor: - HeartCare Cardiology (general and EP) - Clinical Pharmacy for coumadin, hypertension, lipid, weight-loss medications, and med management appointments    Valet parking services will be available as well.

## 2023-02-05 DIAGNOSIS — K219 Gastro-esophageal reflux disease without esophagitis: Secondary | ICD-10-CM | POA: Diagnosis not present

## 2023-02-05 DIAGNOSIS — R2689 Other abnormalities of gait and mobility: Secondary | ICD-10-CM | POA: Diagnosis not present

## 2023-02-05 DIAGNOSIS — R609 Edema, unspecified: Secondary | ICD-10-CM | POA: Diagnosis not present

## 2023-02-05 DIAGNOSIS — I2510 Atherosclerotic heart disease of native coronary artery without angina pectoris: Secondary | ICD-10-CM | POA: Diagnosis not present

## 2023-02-05 DIAGNOSIS — R52 Pain, unspecified: Secondary | ICD-10-CM | POA: Diagnosis not present

## 2023-02-05 DIAGNOSIS — I10 Essential (primary) hypertension: Secondary | ICD-10-CM | POA: Diagnosis not present

## 2023-02-05 DIAGNOSIS — S42121S Displaced fracture of acromial process, right shoulder, sequela: Secondary | ICD-10-CM | POA: Diagnosis not present

## 2023-02-05 DIAGNOSIS — R1312 Dysphagia, oropharyngeal phase: Secondary | ICD-10-CM | POA: Diagnosis not present

## 2023-02-05 DIAGNOSIS — M6281 Muscle weakness (generalized): Secondary | ICD-10-CM | POA: Diagnosis not present

## 2023-02-05 DIAGNOSIS — R1314 Dysphagia, pharyngoesophageal phase: Secondary | ICD-10-CM | POA: Diagnosis not present

## 2023-02-05 DIAGNOSIS — I5032 Chronic diastolic (congestive) heart failure: Secondary | ICD-10-CM | POA: Diagnosis not present

## 2023-02-05 DIAGNOSIS — A419 Sepsis, unspecified organism: Secondary | ICD-10-CM | POA: Diagnosis not present

## 2023-02-05 DIAGNOSIS — R262 Difficulty in walking, not elsewhere classified: Secondary | ICD-10-CM | POA: Diagnosis not present

## 2023-02-06 DIAGNOSIS — K219 Gastro-esophageal reflux disease without esophagitis: Secondary | ICD-10-CM | POA: Diagnosis not present

## 2023-02-06 DIAGNOSIS — M6281 Muscle weakness (generalized): Secondary | ICD-10-CM | POA: Diagnosis not present

## 2023-02-06 DIAGNOSIS — R609 Edema, unspecified: Secondary | ICD-10-CM | POA: Diagnosis not present

## 2023-02-06 DIAGNOSIS — R52 Pain, unspecified: Secondary | ICD-10-CM | POA: Diagnosis not present

## 2023-02-06 DIAGNOSIS — R1314 Dysphagia, pharyngoesophageal phase: Secondary | ICD-10-CM | POA: Diagnosis not present

## 2023-02-06 DIAGNOSIS — R262 Difficulty in walking, not elsewhere classified: Secondary | ICD-10-CM | POA: Diagnosis not present

## 2023-02-06 DIAGNOSIS — R2689 Other abnormalities of gait and mobility: Secondary | ICD-10-CM | POA: Diagnosis not present

## 2023-02-06 DIAGNOSIS — I5032 Chronic diastolic (congestive) heart failure: Secondary | ICD-10-CM | POA: Diagnosis not present

## 2023-02-06 DIAGNOSIS — R1312 Dysphagia, oropharyngeal phase: Secondary | ICD-10-CM | POA: Diagnosis not present

## 2023-02-06 DIAGNOSIS — I2510 Atherosclerotic heart disease of native coronary artery without angina pectoris: Secondary | ICD-10-CM | POA: Diagnosis not present

## 2023-02-06 DIAGNOSIS — A419 Sepsis, unspecified organism: Secondary | ICD-10-CM | POA: Diagnosis not present

## 2023-02-06 DIAGNOSIS — S42121S Displaced fracture of acromial process, right shoulder, sequela: Secondary | ICD-10-CM | POA: Diagnosis not present

## 2023-02-07 DIAGNOSIS — R262 Difficulty in walking, not elsewhere classified: Secondary | ICD-10-CM | POA: Diagnosis not present

## 2023-02-07 DIAGNOSIS — R52 Pain, unspecified: Secondary | ICD-10-CM | POA: Diagnosis not present

## 2023-02-07 DIAGNOSIS — S42121S Displaced fracture of acromial process, right shoulder, sequela: Secondary | ICD-10-CM | POA: Diagnosis not present

## 2023-02-07 DIAGNOSIS — B379 Candidiasis, unspecified: Secondary | ICD-10-CM | POA: Diagnosis not present

## 2023-02-07 DIAGNOSIS — R609 Edema, unspecified: Secondary | ICD-10-CM | POA: Diagnosis not present

## 2023-02-07 DIAGNOSIS — R1312 Dysphagia, oropharyngeal phase: Secondary | ICD-10-CM | POA: Diagnosis not present

## 2023-02-07 DIAGNOSIS — M6281 Muscle weakness (generalized): Secondary | ICD-10-CM | POA: Diagnosis not present

## 2023-02-07 DIAGNOSIS — R2689 Other abnormalities of gait and mobility: Secondary | ICD-10-CM | POA: Diagnosis not present

## 2023-02-07 DIAGNOSIS — R1314 Dysphagia, pharyngoesophageal phase: Secondary | ICD-10-CM | POA: Diagnosis not present

## 2023-02-07 DIAGNOSIS — K219 Gastro-esophageal reflux disease without esophagitis: Secondary | ICD-10-CM | POA: Diagnosis not present

## 2023-02-07 DIAGNOSIS — D333 Benign neoplasm of cranial nerves: Secondary | ICD-10-CM | POA: Diagnosis not present

## 2023-02-10 DIAGNOSIS — R1314 Dysphagia, pharyngoesophageal phase: Secondary | ICD-10-CM | POA: Diagnosis not present

## 2023-02-10 DIAGNOSIS — R262 Difficulty in walking, not elsewhere classified: Secondary | ICD-10-CM | POA: Diagnosis not present

## 2023-02-11 DIAGNOSIS — B379 Candidiasis, unspecified: Secondary | ICD-10-CM | POA: Diagnosis not present

## 2023-02-11 DIAGNOSIS — R609 Edema, unspecified: Secondary | ICD-10-CM | POA: Diagnosis not present

## 2023-02-11 DIAGNOSIS — R1312 Dysphagia, oropharyngeal phase: Secondary | ICD-10-CM | POA: Diagnosis not present

## 2023-02-11 DIAGNOSIS — R2689 Other abnormalities of gait and mobility: Secondary | ICD-10-CM | POA: Diagnosis not present

## 2023-02-11 DIAGNOSIS — R262 Difficulty in walking, not elsewhere classified: Secondary | ICD-10-CM | POA: Diagnosis not present

## 2023-02-11 DIAGNOSIS — D333 Benign neoplasm of cranial nerves: Secondary | ICD-10-CM | POA: Diagnosis not present

## 2023-02-11 DIAGNOSIS — R1314 Dysphagia, pharyngoesophageal phase: Secondary | ICD-10-CM | POA: Diagnosis not present

## 2023-02-11 DIAGNOSIS — R52 Pain, unspecified: Secondary | ICD-10-CM | POA: Diagnosis not present

## 2023-02-11 DIAGNOSIS — K219 Gastro-esophageal reflux disease without esophagitis: Secondary | ICD-10-CM | POA: Diagnosis not present

## 2023-02-11 DIAGNOSIS — S42121S Displaced fracture of acromial process, right shoulder, sequela: Secondary | ICD-10-CM | POA: Diagnosis not present

## 2023-02-12 DIAGNOSIS — D649 Anemia, unspecified: Secondary | ICD-10-CM | POA: Diagnosis not present

## 2023-02-12 DIAGNOSIS — R262 Difficulty in walking, not elsewhere classified: Secondary | ICD-10-CM | POA: Diagnosis not present

## 2023-02-12 DIAGNOSIS — R1314 Dysphagia, pharyngoesophageal phase: Secondary | ICD-10-CM | POA: Diagnosis not present

## 2023-02-13 DIAGNOSIS — R1314 Dysphagia, pharyngoesophageal phase: Secondary | ICD-10-CM | POA: Diagnosis not present

## 2023-02-13 DIAGNOSIS — S42121S Displaced fracture of acromial process, right shoulder, sequela: Secondary | ICD-10-CM | POA: Diagnosis not present

## 2023-02-13 DIAGNOSIS — R1312 Dysphagia, oropharyngeal phase: Secondary | ICD-10-CM | POA: Diagnosis not present

## 2023-02-13 DIAGNOSIS — B379 Candidiasis, unspecified: Secondary | ICD-10-CM | POA: Diagnosis not present

## 2023-02-13 DIAGNOSIS — D333 Benign neoplasm of cranial nerves: Secondary | ICD-10-CM | POA: Diagnosis not present

## 2023-02-13 DIAGNOSIS — R609 Edema, unspecified: Secondary | ICD-10-CM | POA: Diagnosis not present

## 2023-02-13 DIAGNOSIS — K219 Gastro-esophageal reflux disease without esophagitis: Secondary | ICD-10-CM | POA: Diagnosis not present

## 2023-02-13 DIAGNOSIS — R262 Difficulty in walking, not elsewhere classified: Secondary | ICD-10-CM | POA: Diagnosis not present

## 2023-02-13 DIAGNOSIS — R2689 Other abnormalities of gait and mobility: Secondary | ICD-10-CM | POA: Diagnosis not present

## 2023-02-13 DIAGNOSIS — R52 Pain, unspecified: Secondary | ICD-10-CM | POA: Diagnosis not present

## 2023-02-14 DIAGNOSIS — R262 Difficulty in walking, not elsewhere classified: Secondary | ICD-10-CM | POA: Diagnosis not present

## 2023-02-14 DIAGNOSIS — R2689 Other abnormalities of gait and mobility: Secondary | ICD-10-CM | POA: Diagnosis not present

## 2023-02-14 DIAGNOSIS — K219 Gastro-esophageal reflux disease without esophagitis: Secondary | ICD-10-CM | POA: Diagnosis not present

## 2023-02-14 DIAGNOSIS — M62838 Other muscle spasm: Secondary | ICD-10-CM | POA: Diagnosis not present

## 2023-02-14 DIAGNOSIS — D333 Benign neoplasm of cranial nerves: Secondary | ICD-10-CM | POA: Diagnosis not present

## 2023-02-14 DIAGNOSIS — R52 Pain, unspecified: Secondary | ICD-10-CM | POA: Diagnosis not present

## 2023-02-14 DIAGNOSIS — B379 Candidiasis, unspecified: Secondary | ICD-10-CM | POA: Diagnosis not present

## 2023-02-14 DIAGNOSIS — R1314 Dysphagia, pharyngoesophageal phase: Secondary | ICD-10-CM | POA: Diagnosis not present

## 2023-02-14 DIAGNOSIS — R609 Edema, unspecified: Secondary | ICD-10-CM | POA: Diagnosis not present

## 2023-02-14 DIAGNOSIS — S42121S Displaced fracture of acromial process, right shoulder, sequela: Secondary | ICD-10-CM | POA: Diagnosis not present

## 2023-02-14 DIAGNOSIS — R1312 Dysphagia, oropharyngeal phase: Secondary | ICD-10-CM | POA: Diagnosis not present

## 2023-02-17 DIAGNOSIS — R262 Difficulty in walking, not elsewhere classified: Secondary | ICD-10-CM | POA: Diagnosis not present

## 2023-02-17 DIAGNOSIS — Z961 Presence of intraocular lens: Secondary | ICD-10-CM | POA: Diagnosis not present

## 2023-02-17 DIAGNOSIS — H5211 Myopia, right eye: Secondary | ICD-10-CM | POA: Diagnosis not present

## 2023-02-17 DIAGNOSIS — I10 Essential (primary) hypertension: Secondary | ICD-10-CM | POA: Diagnosis not present

## 2023-02-17 DIAGNOSIS — R1314 Dysphagia, pharyngoesophageal phase: Secondary | ICD-10-CM | POA: Diagnosis not present

## 2023-02-17 DIAGNOSIS — E119 Type 2 diabetes mellitus without complications: Secondary | ICD-10-CM | POA: Diagnosis not present

## 2023-02-18 ENCOUNTER — Other Ambulatory Visit: Payer: Self-pay

## 2023-02-18 DIAGNOSIS — S42121S Displaced fracture of acromial process, right shoulder, sequela: Secondary | ICD-10-CM | POA: Diagnosis not present

## 2023-02-18 DIAGNOSIS — D333 Benign neoplasm of cranial nerves: Secondary | ICD-10-CM | POA: Diagnosis not present

## 2023-02-18 DIAGNOSIS — R52 Pain, unspecified: Secondary | ICD-10-CM | POA: Diagnosis not present

## 2023-02-18 DIAGNOSIS — R609 Edema, unspecified: Secondary | ICD-10-CM | POA: Diagnosis not present

## 2023-02-18 DIAGNOSIS — R1314 Dysphagia, pharyngoesophageal phase: Secondary | ICD-10-CM | POA: Diagnosis not present

## 2023-02-18 DIAGNOSIS — R1312 Dysphagia, oropharyngeal phase: Secondary | ICD-10-CM | POA: Diagnosis not present

## 2023-02-18 DIAGNOSIS — K219 Gastro-esophageal reflux disease without esophagitis: Secondary | ICD-10-CM | POA: Diagnosis not present

## 2023-02-18 DIAGNOSIS — I6523 Occlusion and stenosis of bilateral carotid arteries: Secondary | ICD-10-CM

## 2023-02-18 DIAGNOSIS — B379 Candidiasis, unspecified: Secondary | ICD-10-CM | POA: Diagnosis not present

## 2023-02-18 DIAGNOSIS — R262 Difficulty in walking, not elsewhere classified: Secondary | ICD-10-CM | POA: Diagnosis not present

## 2023-02-18 DIAGNOSIS — R2689 Other abnormalities of gait and mobility: Secondary | ICD-10-CM | POA: Diagnosis not present

## 2023-02-19 DIAGNOSIS — S42121S Displaced fracture of acromial process, right shoulder, sequela: Secondary | ICD-10-CM | POA: Diagnosis not present

## 2023-02-19 DIAGNOSIS — D333 Benign neoplasm of cranial nerves: Secondary | ICD-10-CM | POA: Diagnosis not present

## 2023-02-19 DIAGNOSIS — R609 Edema, unspecified: Secondary | ICD-10-CM | POA: Diagnosis not present

## 2023-02-19 DIAGNOSIS — R262 Difficulty in walking, not elsewhere classified: Secondary | ICD-10-CM | POA: Diagnosis not present

## 2023-02-19 DIAGNOSIS — R1312 Dysphagia, oropharyngeal phase: Secondary | ICD-10-CM | POA: Diagnosis not present

## 2023-02-19 DIAGNOSIS — R52 Pain, unspecified: Secondary | ICD-10-CM | POA: Diagnosis not present

## 2023-02-19 DIAGNOSIS — R2689 Other abnormalities of gait and mobility: Secondary | ICD-10-CM | POA: Diagnosis not present

## 2023-02-19 DIAGNOSIS — R1314 Dysphagia, pharyngoesophageal phase: Secondary | ICD-10-CM | POA: Diagnosis not present

## 2023-02-19 DIAGNOSIS — K219 Gastro-esophageal reflux disease without esophagitis: Secondary | ICD-10-CM | POA: Diagnosis not present

## 2023-02-19 DIAGNOSIS — B379 Candidiasis, unspecified: Secondary | ICD-10-CM | POA: Diagnosis not present

## 2023-02-20 DIAGNOSIS — R1312 Dysphagia, oropharyngeal phase: Secondary | ICD-10-CM | POA: Diagnosis not present

## 2023-02-20 DIAGNOSIS — I10 Essential (primary) hypertension: Secondary | ICD-10-CM | POA: Diagnosis not present

## 2023-02-20 DIAGNOSIS — R609 Edema, unspecified: Secondary | ICD-10-CM | POA: Diagnosis not present

## 2023-02-20 DIAGNOSIS — K219 Gastro-esophageal reflux disease without esophagitis: Secondary | ICD-10-CM | POA: Diagnosis not present

## 2023-02-20 DIAGNOSIS — R262 Difficulty in walking, not elsewhere classified: Secondary | ICD-10-CM | POA: Diagnosis not present

## 2023-02-20 DIAGNOSIS — R52 Pain, unspecified: Secondary | ICD-10-CM | POA: Diagnosis not present

## 2023-02-20 DIAGNOSIS — B379 Candidiasis, unspecified: Secondary | ICD-10-CM | POA: Diagnosis not present

## 2023-02-20 DIAGNOSIS — S42121S Displaced fracture of acromial process, right shoulder, sequela: Secondary | ICD-10-CM | POA: Diagnosis not present

## 2023-02-20 DIAGNOSIS — R2689 Other abnormalities of gait and mobility: Secondary | ICD-10-CM | POA: Diagnosis not present

## 2023-02-20 DIAGNOSIS — D333 Benign neoplasm of cranial nerves: Secondary | ICD-10-CM | POA: Diagnosis not present

## 2023-02-21 DIAGNOSIS — R2689 Other abnormalities of gait and mobility: Secondary | ICD-10-CM | POA: Diagnosis not present

## 2023-02-21 DIAGNOSIS — R262 Difficulty in walking, not elsewhere classified: Secondary | ICD-10-CM | POA: Diagnosis not present

## 2023-02-21 DIAGNOSIS — N182 Chronic kidney disease, stage 2 (mild): Secondary | ICD-10-CM | POA: Diagnosis not present

## 2023-02-21 DIAGNOSIS — R1312 Dysphagia, oropharyngeal phase: Secondary | ICD-10-CM | POA: Diagnosis not present

## 2023-02-21 DIAGNOSIS — K219 Gastro-esophageal reflux disease without esophagitis: Secondary | ICD-10-CM | POA: Diagnosis not present

## 2023-02-21 DIAGNOSIS — R52 Pain, unspecified: Secondary | ICD-10-CM | POA: Diagnosis not present

## 2023-02-21 DIAGNOSIS — S42121S Displaced fracture of acromial process, right shoulder, sequela: Secondary | ICD-10-CM | POA: Diagnosis not present

## 2023-02-21 DIAGNOSIS — R609 Edema, unspecified: Secondary | ICD-10-CM | POA: Diagnosis not present

## 2023-02-21 DIAGNOSIS — R1314 Dysphagia, pharyngoesophageal phase: Secondary | ICD-10-CM | POA: Diagnosis not present

## 2023-02-21 DIAGNOSIS — B379 Candidiasis, unspecified: Secondary | ICD-10-CM | POA: Diagnosis not present

## 2023-02-21 DIAGNOSIS — D333 Benign neoplasm of cranial nerves: Secondary | ICD-10-CM | POA: Diagnosis not present

## 2023-02-22 DIAGNOSIS — R52 Pain, unspecified: Secondary | ICD-10-CM | POA: Diagnosis not present

## 2023-02-22 DIAGNOSIS — D333 Benign neoplasm of cranial nerves: Secondary | ICD-10-CM | POA: Diagnosis not present

## 2023-02-22 DIAGNOSIS — R262 Difficulty in walking, not elsewhere classified: Secondary | ICD-10-CM | POA: Diagnosis not present

## 2023-02-22 DIAGNOSIS — R609 Edema, unspecified: Secondary | ICD-10-CM | POA: Diagnosis not present

## 2023-02-22 DIAGNOSIS — S42121S Displaced fracture of acromial process, right shoulder, sequela: Secondary | ICD-10-CM | POA: Diagnosis not present

## 2023-02-22 DIAGNOSIS — R2689 Other abnormalities of gait and mobility: Secondary | ICD-10-CM | POA: Diagnosis not present

## 2023-02-22 DIAGNOSIS — M62838 Other muscle spasm: Secondary | ICD-10-CM | POA: Diagnosis not present

## 2023-02-22 DIAGNOSIS — K219 Gastro-esophageal reflux disease without esophagitis: Secondary | ICD-10-CM | POA: Diagnosis not present

## 2023-02-22 DIAGNOSIS — R1312 Dysphagia, oropharyngeal phase: Secondary | ICD-10-CM | POA: Diagnosis not present

## 2023-02-22 DIAGNOSIS — B379 Candidiasis, unspecified: Secondary | ICD-10-CM | POA: Diagnosis not present

## 2023-02-23 DIAGNOSIS — D333 Benign neoplasm of cranial nerves: Secondary | ICD-10-CM | POA: Diagnosis not present

## 2023-02-23 DIAGNOSIS — K219 Gastro-esophageal reflux disease without esophagitis: Secondary | ICD-10-CM | POA: Diagnosis not present

## 2023-02-23 DIAGNOSIS — M62838 Other muscle spasm: Secondary | ICD-10-CM | POA: Diagnosis not present

## 2023-02-23 DIAGNOSIS — R52 Pain, unspecified: Secondary | ICD-10-CM | POA: Diagnosis not present

## 2023-02-23 DIAGNOSIS — R262 Difficulty in walking, not elsewhere classified: Secondary | ICD-10-CM | POA: Diagnosis not present

## 2023-02-23 DIAGNOSIS — S42121S Displaced fracture of acromial process, right shoulder, sequela: Secondary | ICD-10-CM | POA: Diagnosis not present

## 2023-02-23 DIAGNOSIS — R1312 Dysphagia, oropharyngeal phase: Secondary | ICD-10-CM | POA: Diagnosis not present

## 2023-02-23 DIAGNOSIS — B379 Candidiasis, unspecified: Secondary | ICD-10-CM | POA: Diagnosis not present

## 2023-02-23 DIAGNOSIS — R2689 Other abnormalities of gait and mobility: Secondary | ICD-10-CM | POA: Diagnosis not present

## 2023-02-23 DIAGNOSIS — R609 Edema, unspecified: Secondary | ICD-10-CM | POA: Diagnosis not present

## 2023-02-24 DIAGNOSIS — R262 Difficulty in walking, not elsewhere classified: Secondary | ICD-10-CM | POA: Diagnosis not present

## 2023-02-24 DIAGNOSIS — R1314 Dysphagia, pharyngoesophageal phase: Secondary | ICD-10-CM | POA: Diagnosis not present

## 2023-02-25 DIAGNOSIS — R1314 Dysphagia, pharyngoesophageal phase: Secondary | ICD-10-CM | POA: Diagnosis not present

## 2023-02-25 DIAGNOSIS — R262 Difficulty in walking, not elsewhere classified: Secondary | ICD-10-CM | POA: Diagnosis not present

## 2023-02-26 DIAGNOSIS — R262 Difficulty in walking, not elsewhere classified: Secondary | ICD-10-CM | POA: Diagnosis not present

## 2023-02-26 DIAGNOSIS — R1314 Dysphagia, pharyngoesophageal phase: Secondary | ICD-10-CM | POA: Diagnosis not present

## 2023-02-27 DIAGNOSIS — R262 Difficulty in walking, not elsewhere classified: Secondary | ICD-10-CM | POA: Diagnosis not present

## 2023-02-27 DIAGNOSIS — R1314 Dysphagia, pharyngoesophageal phase: Secondary | ICD-10-CM | POA: Diagnosis not present

## 2023-02-28 DIAGNOSIS — R262 Difficulty in walking, not elsewhere classified: Secondary | ICD-10-CM | POA: Diagnosis not present

## 2023-02-28 DIAGNOSIS — R1314 Dysphagia, pharyngoesophageal phase: Secondary | ICD-10-CM | POA: Diagnosis not present

## 2023-03-03 DIAGNOSIS — I158 Other secondary hypertension: Secondary | ICD-10-CM | POA: Diagnosis not present

## 2023-03-03 DIAGNOSIS — B379 Candidiasis, unspecified: Secondary | ICD-10-CM | POA: Diagnosis not present

## 2023-03-03 DIAGNOSIS — R609 Edema, unspecified: Secondary | ICD-10-CM | POA: Diagnosis not present

## 2023-03-03 DIAGNOSIS — R1312 Dysphagia, oropharyngeal phase: Secondary | ICD-10-CM | POA: Diagnosis not present

## 2023-03-03 DIAGNOSIS — K219 Gastro-esophageal reflux disease without esophagitis: Secondary | ICD-10-CM | POA: Diagnosis not present

## 2023-03-03 DIAGNOSIS — S42121S Displaced fracture of acromial process, right shoulder, sequela: Secondary | ICD-10-CM | POA: Diagnosis not present

## 2023-03-03 DIAGNOSIS — R2689 Other abnormalities of gait and mobility: Secondary | ICD-10-CM | POA: Diagnosis not present

## 2023-03-03 DIAGNOSIS — R52 Pain, unspecified: Secondary | ICD-10-CM | POA: Diagnosis not present

## 2023-03-03 DIAGNOSIS — E119 Type 2 diabetes mellitus without complications: Secondary | ICD-10-CM | POA: Diagnosis not present

## 2023-03-03 DIAGNOSIS — D333 Benign neoplasm of cranial nerves: Secondary | ICD-10-CM | POA: Diagnosis not present

## 2023-03-03 DIAGNOSIS — R1314 Dysphagia, pharyngoesophageal phase: Secondary | ICD-10-CM | POA: Diagnosis not present

## 2023-03-03 DIAGNOSIS — R262 Difficulty in walking, not elsewhere classified: Secondary | ICD-10-CM | POA: Diagnosis not present

## 2023-03-04 DIAGNOSIS — R1312 Dysphagia, oropharyngeal phase: Secondary | ICD-10-CM | POA: Diagnosis not present

## 2023-03-04 DIAGNOSIS — K219 Gastro-esophageal reflux disease without esophagitis: Secondary | ICD-10-CM | POA: Diagnosis not present

## 2023-03-04 DIAGNOSIS — R2689 Other abnormalities of gait and mobility: Secondary | ICD-10-CM | POA: Diagnosis not present

## 2023-03-04 DIAGNOSIS — D333 Benign neoplasm of cranial nerves: Secondary | ICD-10-CM | POA: Diagnosis not present

## 2023-03-04 DIAGNOSIS — S42121S Displaced fracture of acromial process, right shoulder, sequela: Secondary | ICD-10-CM | POA: Diagnosis not present

## 2023-03-04 DIAGNOSIS — R1314 Dysphagia, pharyngoesophageal phase: Secondary | ICD-10-CM | POA: Diagnosis not present

## 2023-03-04 DIAGNOSIS — B379 Candidiasis, unspecified: Secondary | ICD-10-CM | POA: Diagnosis not present

## 2023-03-04 DIAGNOSIS — R262 Difficulty in walking, not elsewhere classified: Secondary | ICD-10-CM | POA: Diagnosis not present

## 2023-03-04 DIAGNOSIS — R609 Edema, unspecified: Secondary | ICD-10-CM | POA: Diagnosis not present

## 2023-03-04 DIAGNOSIS — R52 Pain, unspecified: Secondary | ICD-10-CM | POA: Diagnosis not present

## 2023-03-05 DIAGNOSIS — R1314 Dysphagia, pharyngoesophageal phase: Secondary | ICD-10-CM | POA: Diagnosis not present

## 2023-03-05 DIAGNOSIS — R262 Difficulty in walking, not elsewhere classified: Secondary | ICD-10-CM | POA: Diagnosis not present

## 2023-03-06 DIAGNOSIS — R2689 Other abnormalities of gait and mobility: Secondary | ICD-10-CM | POA: Diagnosis not present

## 2023-03-06 DIAGNOSIS — R262 Difficulty in walking, not elsewhere classified: Secondary | ICD-10-CM | POA: Diagnosis not present

## 2023-03-06 DIAGNOSIS — R1314 Dysphagia, pharyngoesophageal phase: Secondary | ICD-10-CM | POA: Diagnosis not present

## 2023-03-06 DIAGNOSIS — B379 Candidiasis, unspecified: Secondary | ICD-10-CM | POA: Diagnosis not present

## 2023-03-06 DIAGNOSIS — S42121S Displaced fracture of acromial process, right shoulder, sequela: Secondary | ICD-10-CM | POA: Diagnosis not present

## 2023-03-06 DIAGNOSIS — R609 Edema, unspecified: Secondary | ICD-10-CM | POA: Diagnosis not present

## 2023-03-06 DIAGNOSIS — R52 Pain, unspecified: Secondary | ICD-10-CM | POA: Diagnosis not present

## 2023-03-06 DIAGNOSIS — K219 Gastro-esophageal reflux disease without esophagitis: Secondary | ICD-10-CM | POA: Diagnosis not present

## 2023-03-06 DIAGNOSIS — D333 Benign neoplasm of cranial nerves: Secondary | ICD-10-CM | POA: Diagnosis not present

## 2023-03-06 DIAGNOSIS — R1312 Dysphagia, oropharyngeal phase: Secondary | ICD-10-CM | POA: Diagnosis not present

## 2023-03-07 DIAGNOSIS — A419 Sepsis, unspecified organism: Secondary | ICD-10-CM | POA: Diagnosis not present

## 2023-03-07 DIAGNOSIS — I5022 Chronic systolic (congestive) heart failure: Secondary | ICD-10-CM | POA: Diagnosis not present

## 2023-03-07 DIAGNOSIS — M62838 Other muscle spasm: Secondary | ICD-10-CM | POA: Diagnosis not present

## 2023-03-07 DIAGNOSIS — Z79899 Other long term (current) drug therapy: Secondary | ICD-10-CM | POA: Diagnosis not present

## 2023-03-07 DIAGNOSIS — R52 Pain, unspecified: Secondary | ICD-10-CM | POA: Diagnosis not present

## 2023-03-07 DIAGNOSIS — M6281 Muscle weakness (generalized): Secondary | ICD-10-CM | POA: Diagnosis not present

## 2023-03-07 DIAGNOSIS — R2689 Other abnormalities of gait and mobility: Secondary | ICD-10-CM | POA: Diagnosis not present

## 2023-03-07 DIAGNOSIS — R609 Edema, unspecified: Secondary | ICD-10-CM | POA: Diagnosis not present

## 2023-03-07 DIAGNOSIS — R262 Difficulty in walking, not elsewhere classified: Secondary | ICD-10-CM | POA: Diagnosis not present

## 2023-03-07 DIAGNOSIS — R1314 Dysphagia, pharyngoesophageal phase: Secondary | ICD-10-CM | POA: Diagnosis not present

## 2023-03-07 DIAGNOSIS — R1312 Dysphagia, oropharyngeal phase: Secondary | ICD-10-CM | POA: Diagnosis not present

## 2023-03-07 DIAGNOSIS — I5032 Chronic diastolic (congestive) heart failure: Secondary | ICD-10-CM | POA: Diagnosis not present

## 2023-03-07 DIAGNOSIS — I2510 Atherosclerotic heart disease of native coronary artery without angina pectoris: Secondary | ICD-10-CM | POA: Diagnosis not present

## 2023-03-07 DIAGNOSIS — K219 Gastro-esophageal reflux disease without esophagitis: Secondary | ICD-10-CM | POA: Diagnosis not present

## 2023-03-07 DIAGNOSIS — R0602 Shortness of breath: Secondary | ICD-10-CM | POA: Diagnosis not present

## 2023-03-07 DIAGNOSIS — S42121S Displaced fracture of acromial process, right shoulder, sequela: Secondary | ICD-10-CM | POA: Diagnosis not present

## 2023-03-10 DIAGNOSIS — R1312 Dysphagia, oropharyngeal phase: Secondary | ICD-10-CM | POA: Diagnosis not present

## 2023-03-10 DIAGNOSIS — R2689 Other abnormalities of gait and mobility: Secondary | ICD-10-CM | POA: Diagnosis not present

## 2023-03-10 DIAGNOSIS — K219 Gastro-esophageal reflux disease without esophagitis: Secondary | ICD-10-CM | POA: Diagnosis not present

## 2023-03-10 DIAGNOSIS — D333 Benign neoplasm of cranial nerves: Secondary | ICD-10-CM | POA: Diagnosis not present

## 2023-03-10 DIAGNOSIS — R609 Edema, unspecified: Secondary | ICD-10-CM | POA: Diagnosis not present

## 2023-03-10 DIAGNOSIS — R52 Pain, unspecified: Secondary | ICD-10-CM | POA: Diagnosis not present

## 2023-03-10 DIAGNOSIS — S42121S Displaced fracture of acromial process, right shoulder, sequela: Secondary | ICD-10-CM | POA: Diagnosis not present

## 2023-03-10 DIAGNOSIS — B379 Candidiasis, unspecified: Secondary | ICD-10-CM | POA: Diagnosis not present

## 2023-03-10 DIAGNOSIS — R262 Difficulty in walking, not elsewhere classified: Secondary | ICD-10-CM | POA: Diagnosis not present

## 2023-03-11 ENCOUNTER — Ambulatory Visit: Payer: 59 | Admitting: Podiatry

## 2023-03-11 DIAGNOSIS — B379 Candidiasis, unspecified: Secondary | ICD-10-CM | POA: Diagnosis not present

## 2023-03-11 DIAGNOSIS — R262 Difficulty in walking, not elsewhere classified: Secondary | ICD-10-CM | POA: Diagnosis not present

## 2023-03-11 DIAGNOSIS — K219 Gastro-esophageal reflux disease without esophagitis: Secondary | ICD-10-CM | POA: Diagnosis not present

## 2023-03-11 DIAGNOSIS — Z91199 Patient's noncompliance with other medical treatment and regimen due to unspecified reason: Secondary | ICD-10-CM

## 2023-03-11 DIAGNOSIS — R2689 Other abnormalities of gait and mobility: Secondary | ICD-10-CM | POA: Diagnosis not present

## 2023-03-11 DIAGNOSIS — R1314 Dysphagia, pharyngoesophageal phase: Secondary | ICD-10-CM | POA: Diagnosis not present

## 2023-03-11 DIAGNOSIS — S42121S Displaced fracture of acromial process, right shoulder, sequela: Secondary | ICD-10-CM | POA: Diagnosis not present

## 2023-03-11 DIAGNOSIS — D333 Benign neoplasm of cranial nerves: Secondary | ICD-10-CM | POA: Diagnosis not present

## 2023-03-11 DIAGNOSIS — R52 Pain, unspecified: Secondary | ICD-10-CM | POA: Diagnosis not present

## 2023-03-11 DIAGNOSIS — R609 Edema, unspecified: Secondary | ICD-10-CM | POA: Diagnosis not present

## 2023-03-11 DIAGNOSIS — R1312 Dysphagia, oropharyngeal phase: Secondary | ICD-10-CM | POA: Diagnosis not present

## 2023-03-11 NOTE — Progress Notes (Signed)
 1. No-show for appointment

## 2023-03-12 ENCOUNTER — Telehealth: Payer: Self-pay | Admitting: Physician Assistant

## 2023-03-12 DIAGNOSIS — R1314 Dysphagia, pharyngoesophageal phase: Secondary | ICD-10-CM | POA: Diagnosis not present

## 2023-03-12 DIAGNOSIS — R262 Difficulty in walking, not elsewhere classified: Secondary | ICD-10-CM | POA: Diagnosis not present

## 2023-03-12 NOTE — Telephone Encounter (Signed)
 New Message:       Patient said she had a chest xray at the facility that she stays in . She will send a copy of the xray so Elon Jester can review it.

## 2023-03-13 DIAGNOSIS — B379 Candidiasis, unspecified: Secondary | ICD-10-CM | POA: Diagnosis not present

## 2023-03-13 DIAGNOSIS — R1312 Dysphagia, oropharyngeal phase: Secondary | ICD-10-CM | POA: Diagnosis not present

## 2023-03-13 DIAGNOSIS — R1314 Dysphagia, pharyngoesophageal phase: Secondary | ICD-10-CM | POA: Diagnosis not present

## 2023-03-13 DIAGNOSIS — R262 Difficulty in walking, not elsewhere classified: Secondary | ICD-10-CM | POA: Diagnosis not present

## 2023-03-13 DIAGNOSIS — R2689 Other abnormalities of gait and mobility: Secondary | ICD-10-CM | POA: Diagnosis not present

## 2023-03-13 DIAGNOSIS — S42121S Displaced fracture of acromial process, right shoulder, sequela: Secondary | ICD-10-CM | POA: Diagnosis not present

## 2023-03-13 DIAGNOSIS — K219 Gastro-esophageal reflux disease without esophagitis: Secondary | ICD-10-CM | POA: Diagnosis not present

## 2023-03-13 DIAGNOSIS — R52 Pain, unspecified: Secondary | ICD-10-CM | POA: Diagnosis not present

## 2023-03-13 DIAGNOSIS — R609 Edema, unspecified: Secondary | ICD-10-CM | POA: Diagnosis not present

## 2023-03-13 DIAGNOSIS — D333 Benign neoplasm of cranial nerves: Secondary | ICD-10-CM | POA: Diagnosis not present

## 2023-03-14 DIAGNOSIS — R1314 Dysphagia, pharyngoesophageal phase: Secondary | ICD-10-CM | POA: Diagnosis not present

## 2023-03-14 DIAGNOSIS — R262 Difficulty in walking, not elsewhere classified: Secondary | ICD-10-CM | POA: Diagnosis not present

## 2023-03-17 DIAGNOSIS — Z794 Long term (current) use of insulin: Secondary | ICD-10-CM | POA: Diagnosis not present

## 2023-03-17 DIAGNOSIS — R262 Difficulty in walking, not elsewhere classified: Secondary | ICD-10-CM | POA: Diagnosis not present

## 2023-03-17 DIAGNOSIS — L602 Onychogryphosis: Secondary | ICD-10-CM | POA: Diagnosis not present

## 2023-03-17 DIAGNOSIS — E1151 Type 2 diabetes mellitus with diabetic peripheral angiopathy without gangrene: Secondary | ICD-10-CM | POA: Diagnosis not present

## 2023-03-17 DIAGNOSIS — L84 Corns and callosities: Secondary | ICD-10-CM | POA: Diagnosis not present

## 2023-03-17 DIAGNOSIS — L603 Nail dystrophy: Secondary | ICD-10-CM | POA: Diagnosis not present

## 2023-03-17 DIAGNOSIS — R1314 Dysphagia, pharyngoesophageal phase: Secondary | ICD-10-CM | POA: Diagnosis not present

## 2023-03-18 DIAGNOSIS — R262 Difficulty in walking, not elsewhere classified: Secondary | ICD-10-CM | POA: Diagnosis not present

## 2023-03-18 DIAGNOSIS — R1314 Dysphagia, pharyngoesophageal phase: Secondary | ICD-10-CM | POA: Diagnosis not present

## 2023-03-19 DIAGNOSIS — R262 Difficulty in walking, not elsewhere classified: Secondary | ICD-10-CM | POA: Diagnosis not present

## 2023-03-19 DIAGNOSIS — R1314 Dysphagia, pharyngoesophageal phase: Secondary | ICD-10-CM | POA: Diagnosis not present

## 2023-03-20 DIAGNOSIS — R1312 Dysphagia, oropharyngeal phase: Secondary | ICD-10-CM | POA: Diagnosis not present

## 2023-03-20 DIAGNOSIS — M62838 Other muscle spasm: Secondary | ICD-10-CM | POA: Diagnosis not present

## 2023-03-20 DIAGNOSIS — R1314 Dysphagia, pharyngoesophageal phase: Secondary | ICD-10-CM | POA: Diagnosis not present

## 2023-03-20 DIAGNOSIS — R2689 Other abnormalities of gait and mobility: Secondary | ICD-10-CM | POA: Diagnosis not present

## 2023-03-20 DIAGNOSIS — K219 Gastro-esophageal reflux disease without esophagitis: Secondary | ICD-10-CM | POA: Diagnosis not present

## 2023-03-20 DIAGNOSIS — R52 Pain, unspecified: Secondary | ICD-10-CM | POA: Diagnosis not present

## 2023-03-20 DIAGNOSIS — B379 Candidiasis, unspecified: Secondary | ICD-10-CM | POA: Diagnosis not present

## 2023-03-20 DIAGNOSIS — R609 Edema, unspecified: Secondary | ICD-10-CM | POA: Diagnosis not present

## 2023-03-20 DIAGNOSIS — D333 Benign neoplasm of cranial nerves: Secondary | ICD-10-CM | POA: Diagnosis not present

## 2023-03-20 DIAGNOSIS — R262 Difficulty in walking, not elsewhere classified: Secondary | ICD-10-CM | POA: Diagnosis not present

## 2023-03-20 DIAGNOSIS — S42121S Displaced fracture of acromial process, right shoulder, sequela: Secondary | ICD-10-CM | POA: Diagnosis not present

## 2023-03-21 DIAGNOSIS — Z79899 Other long term (current) drug therapy: Secondary | ICD-10-CM | POA: Diagnosis not present

## 2023-03-21 DIAGNOSIS — I10 Essential (primary) hypertension: Secondary | ICD-10-CM | POA: Diagnosis not present

## 2023-03-22 DIAGNOSIS — R1314 Dysphagia, pharyngoesophageal phase: Secondary | ICD-10-CM | POA: Diagnosis not present

## 2023-03-22 DIAGNOSIS — R262 Difficulty in walking, not elsewhere classified: Secondary | ICD-10-CM | POA: Diagnosis not present

## 2023-03-24 DIAGNOSIS — K219 Gastro-esophageal reflux disease without esophagitis: Secondary | ICD-10-CM | POA: Diagnosis not present

## 2023-03-24 DIAGNOSIS — D333 Benign neoplasm of cranial nerves: Secondary | ICD-10-CM | POA: Diagnosis not present

## 2023-03-24 DIAGNOSIS — R1312 Dysphagia, oropharyngeal phase: Secondary | ICD-10-CM | POA: Diagnosis not present

## 2023-03-24 DIAGNOSIS — M62838 Other muscle spasm: Secondary | ICD-10-CM | POA: Diagnosis not present

## 2023-03-24 DIAGNOSIS — B379 Candidiasis, unspecified: Secondary | ICD-10-CM | POA: Diagnosis not present

## 2023-03-24 DIAGNOSIS — R262 Difficulty in walking, not elsewhere classified: Secondary | ICD-10-CM | POA: Diagnosis not present

## 2023-03-24 DIAGNOSIS — R2689 Other abnormalities of gait and mobility: Secondary | ICD-10-CM | POA: Diagnosis not present

## 2023-03-24 DIAGNOSIS — R1314 Dysphagia, pharyngoesophageal phase: Secondary | ICD-10-CM | POA: Diagnosis not present

## 2023-03-24 DIAGNOSIS — R609 Edema, unspecified: Secondary | ICD-10-CM | POA: Diagnosis not present

## 2023-03-24 DIAGNOSIS — R52 Pain, unspecified: Secondary | ICD-10-CM | POA: Diagnosis not present

## 2023-03-24 DIAGNOSIS — S42121S Displaced fracture of acromial process, right shoulder, sequela: Secondary | ICD-10-CM | POA: Diagnosis not present

## 2023-03-25 DIAGNOSIS — B379 Candidiasis, unspecified: Secondary | ICD-10-CM | POA: Diagnosis not present

## 2023-03-25 DIAGNOSIS — R52 Pain, unspecified: Secondary | ICD-10-CM | POA: Diagnosis not present

## 2023-03-25 DIAGNOSIS — M62838 Other muscle spasm: Secondary | ICD-10-CM | POA: Diagnosis not present

## 2023-03-25 DIAGNOSIS — S42121S Displaced fracture of acromial process, right shoulder, sequela: Secondary | ICD-10-CM | POA: Diagnosis not present

## 2023-03-25 DIAGNOSIS — R609 Edema, unspecified: Secondary | ICD-10-CM | POA: Diagnosis not present

## 2023-03-25 DIAGNOSIS — D333 Benign neoplasm of cranial nerves: Secondary | ICD-10-CM | POA: Diagnosis not present

## 2023-03-25 DIAGNOSIS — R1312 Dysphagia, oropharyngeal phase: Secondary | ICD-10-CM | POA: Diagnosis not present

## 2023-03-25 DIAGNOSIS — K219 Gastro-esophageal reflux disease without esophagitis: Secondary | ICD-10-CM | POA: Diagnosis not present

## 2023-03-25 DIAGNOSIS — R262 Difficulty in walking, not elsewhere classified: Secondary | ICD-10-CM | POA: Diagnosis not present

## 2023-03-25 DIAGNOSIS — R2689 Other abnormalities of gait and mobility: Secondary | ICD-10-CM | POA: Diagnosis not present

## 2023-03-26 DIAGNOSIS — K219 Gastro-esophageal reflux disease without esophagitis: Secondary | ICD-10-CM | POA: Diagnosis not present

## 2023-03-26 DIAGNOSIS — R262 Difficulty in walking, not elsewhere classified: Secondary | ICD-10-CM | POA: Diagnosis not present

## 2023-03-26 DIAGNOSIS — R52 Pain, unspecified: Secondary | ICD-10-CM | POA: Diagnosis not present

## 2023-03-26 DIAGNOSIS — R2689 Other abnormalities of gait and mobility: Secondary | ICD-10-CM | POA: Diagnosis not present

## 2023-03-26 DIAGNOSIS — D333 Benign neoplasm of cranial nerves: Secondary | ICD-10-CM | POA: Diagnosis not present

## 2023-03-26 DIAGNOSIS — S42121S Displaced fracture of acromial process, right shoulder, sequela: Secondary | ICD-10-CM | POA: Diagnosis not present

## 2023-03-26 DIAGNOSIS — B379 Candidiasis, unspecified: Secondary | ICD-10-CM | POA: Diagnosis not present

## 2023-03-26 DIAGNOSIS — R1312 Dysphagia, oropharyngeal phase: Secondary | ICD-10-CM | POA: Diagnosis not present

## 2023-03-26 DIAGNOSIS — R609 Edema, unspecified: Secondary | ICD-10-CM | POA: Diagnosis not present

## 2023-03-26 DIAGNOSIS — R1314 Dysphagia, pharyngoesophageal phase: Secondary | ICD-10-CM | POA: Diagnosis not present

## 2023-03-27 DIAGNOSIS — R1314 Dysphagia, pharyngoesophageal phase: Secondary | ICD-10-CM | POA: Diagnosis not present

## 2023-03-27 DIAGNOSIS — R262 Difficulty in walking, not elsewhere classified: Secondary | ICD-10-CM | POA: Diagnosis not present

## 2023-03-28 DIAGNOSIS — R1312 Dysphagia, oropharyngeal phase: Secondary | ICD-10-CM | POA: Diagnosis not present

## 2023-03-28 DIAGNOSIS — R52 Pain, unspecified: Secondary | ICD-10-CM | POA: Diagnosis not present

## 2023-03-28 DIAGNOSIS — B379 Candidiasis, unspecified: Secondary | ICD-10-CM | POA: Diagnosis not present

## 2023-03-28 DIAGNOSIS — M62838 Other muscle spasm: Secondary | ICD-10-CM | POA: Diagnosis not present

## 2023-03-28 DIAGNOSIS — R2689 Other abnormalities of gait and mobility: Secondary | ICD-10-CM | POA: Diagnosis not present

## 2023-03-28 DIAGNOSIS — S42121S Displaced fracture of acromial process, right shoulder, sequela: Secondary | ICD-10-CM | POA: Diagnosis not present

## 2023-03-28 DIAGNOSIS — R1314 Dysphagia, pharyngoesophageal phase: Secondary | ICD-10-CM | POA: Diagnosis not present

## 2023-03-28 DIAGNOSIS — R609 Edema, unspecified: Secondary | ICD-10-CM | POA: Diagnosis not present

## 2023-03-28 DIAGNOSIS — K219 Gastro-esophageal reflux disease without esophagitis: Secondary | ICD-10-CM | POA: Diagnosis not present

## 2023-03-28 DIAGNOSIS — D333 Benign neoplasm of cranial nerves: Secondary | ICD-10-CM | POA: Diagnosis not present

## 2023-03-28 DIAGNOSIS — R262 Difficulty in walking, not elsewhere classified: Secondary | ICD-10-CM | POA: Diagnosis not present

## 2023-04-07 DIAGNOSIS — I158 Other secondary hypertension: Secondary | ICD-10-CM | POA: Diagnosis not present

## 2023-04-08 DIAGNOSIS — R262 Difficulty in walking, not elsewhere classified: Secondary | ICD-10-CM | POA: Diagnosis not present

## 2023-04-08 DIAGNOSIS — R1312 Dysphagia, oropharyngeal phase: Secondary | ICD-10-CM | POA: Diagnosis not present

## 2023-04-08 DIAGNOSIS — R609 Edema, unspecified: Secondary | ICD-10-CM | POA: Diagnosis not present

## 2023-04-08 DIAGNOSIS — S42121S Displaced fracture of acromial process, right shoulder, sequela: Secondary | ICD-10-CM | POA: Diagnosis not present

## 2023-04-08 DIAGNOSIS — R52 Pain, unspecified: Secondary | ICD-10-CM | POA: Diagnosis not present

## 2023-04-08 DIAGNOSIS — D333 Benign neoplasm of cranial nerves: Secondary | ICD-10-CM | POA: Diagnosis not present

## 2023-04-08 DIAGNOSIS — M62838 Other muscle spasm: Secondary | ICD-10-CM | POA: Diagnosis not present

## 2023-04-08 DIAGNOSIS — K219 Gastro-esophageal reflux disease without esophagitis: Secondary | ICD-10-CM | POA: Diagnosis not present

## 2023-04-08 DIAGNOSIS — R2689 Other abnormalities of gait and mobility: Secondary | ICD-10-CM | POA: Diagnosis not present

## 2023-04-08 DIAGNOSIS — B379 Candidiasis, unspecified: Secondary | ICD-10-CM | POA: Diagnosis not present

## 2023-04-10 DIAGNOSIS — D333 Benign neoplasm of cranial nerves: Secondary | ICD-10-CM | POA: Diagnosis not present

## 2023-04-10 DIAGNOSIS — K219 Gastro-esophageal reflux disease without esophagitis: Secondary | ICD-10-CM | POA: Diagnosis not present

## 2023-04-10 DIAGNOSIS — R1312 Dysphagia, oropharyngeal phase: Secondary | ICD-10-CM | POA: Diagnosis not present

## 2023-04-10 DIAGNOSIS — R2689 Other abnormalities of gait and mobility: Secondary | ICD-10-CM | POA: Diagnosis not present

## 2023-04-10 DIAGNOSIS — S42121S Displaced fracture of acromial process, right shoulder, sequela: Secondary | ICD-10-CM | POA: Diagnosis not present

## 2023-04-10 DIAGNOSIS — R52 Pain, unspecified: Secondary | ICD-10-CM | POA: Diagnosis not present

## 2023-04-10 DIAGNOSIS — R262 Difficulty in walking, not elsewhere classified: Secondary | ICD-10-CM | POA: Diagnosis not present

## 2023-04-10 DIAGNOSIS — R609 Edema, unspecified: Secondary | ICD-10-CM | POA: Diagnosis not present

## 2023-04-10 DIAGNOSIS — M62838 Other muscle spasm: Secondary | ICD-10-CM | POA: Diagnosis not present

## 2023-04-10 DIAGNOSIS — B379 Candidiasis, unspecified: Secondary | ICD-10-CM | POA: Diagnosis not present

## 2023-04-20 DIAGNOSIS — R609 Edema, unspecified: Secondary | ICD-10-CM | POA: Diagnosis not present

## 2023-04-20 DIAGNOSIS — B379 Candidiasis, unspecified: Secondary | ICD-10-CM | POA: Diagnosis not present

## 2023-04-20 DIAGNOSIS — R2689 Other abnormalities of gait and mobility: Secondary | ICD-10-CM | POA: Diagnosis not present

## 2023-04-20 DIAGNOSIS — K219 Gastro-esophageal reflux disease without esophagitis: Secondary | ICD-10-CM | POA: Diagnosis not present

## 2023-04-20 DIAGNOSIS — R262 Difficulty in walking, not elsewhere classified: Secondary | ICD-10-CM | POA: Diagnosis not present

## 2023-04-20 DIAGNOSIS — D333 Benign neoplasm of cranial nerves: Secondary | ICD-10-CM | POA: Diagnosis not present

## 2023-04-20 DIAGNOSIS — S42121S Displaced fracture of acromial process, right shoulder, sequela: Secondary | ICD-10-CM | POA: Diagnosis not present

## 2023-04-20 DIAGNOSIS — R52 Pain, unspecified: Secondary | ICD-10-CM | POA: Diagnosis not present

## 2023-04-20 DIAGNOSIS — R1312 Dysphagia, oropharyngeal phase: Secondary | ICD-10-CM | POA: Diagnosis not present

## 2023-04-21 DIAGNOSIS — B379 Candidiasis, unspecified: Secondary | ICD-10-CM | POA: Diagnosis not present

## 2023-04-21 DIAGNOSIS — L2989 Other pruritus: Secondary | ICD-10-CM | POA: Diagnosis not present

## 2023-04-21 DIAGNOSIS — R609 Edema, unspecified: Secondary | ICD-10-CM | POA: Diagnosis not present

## 2023-04-21 DIAGNOSIS — R262 Difficulty in walking, not elsewhere classified: Secondary | ICD-10-CM | POA: Diagnosis not present

## 2023-04-21 DIAGNOSIS — K219 Gastro-esophageal reflux disease without esophagitis: Secondary | ICD-10-CM | POA: Diagnosis not present

## 2023-04-21 DIAGNOSIS — R52 Pain, unspecified: Secondary | ICD-10-CM | POA: Diagnosis not present

## 2023-04-21 DIAGNOSIS — S42121S Displaced fracture of acromial process, right shoulder, sequela: Secondary | ICD-10-CM | POA: Diagnosis not present

## 2023-04-21 DIAGNOSIS — R2689 Other abnormalities of gait and mobility: Secondary | ICD-10-CM | POA: Diagnosis not present

## 2023-04-21 DIAGNOSIS — R1312 Dysphagia, oropharyngeal phase: Secondary | ICD-10-CM | POA: Diagnosis not present

## 2023-04-21 DIAGNOSIS — D333 Benign neoplasm of cranial nerves: Secondary | ICD-10-CM | POA: Diagnosis not present

## 2023-04-22 DIAGNOSIS — R52 Pain, unspecified: Secondary | ICD-10-CM | POA: Diagnosis not present

## 2023-04-22 DIAGNOSIS — R262 Difficulty in walking, not elsewhere classified: Secondary | ICD-10-CM | POA: Diagnosis not present

## 2023-04-22 DIAGNOSIS — D333 Benign neoplasm of cranial nerves: Secondary | ICD-10-CM | POA: Diagnosis not present

## 2023-04-22 DIAGNOSIS — R609 Edema, unspecified: Secondary | ICD-10-CM | POA: Diagnosis not present

## 2023-04-22 DIAGNOSIS — R1312 Dysphagia, oropharyngeal phase: Secondary | ICD-10-CM | POA: Diagnosis not present

## 2023-04-22 DIAGNOSIS — R2689 Other abnormalities of gait and mobility: Secondary | ICD-10-CM | POA: Diagnosis not present

## 2023-04-22 DIAGNOSIS — B379 Candidiasis, unspecified: Secondary | ICD-10-CM | POA: Diagnosis not present

## 2023-04-22 DIAGNOSIS — S42121S Displaced fracture of acromial process, right shoulder, sequela: Secondary | ICD-10-CM | POA: Diagnosis not present

## 2023-04-22 DIAGNOSIS — K219 Gastro-esophageal reflux disease without esophagitis: Secondary | ICD-10-CM | POA: Diagnosis not present

## 2023-04-23 DIAGNOSIS — R262 Difficulty in walking, not elsewhere classified: Secondary | ICD-10-CM | POA: Diagnosis not present

## 2023-04-23 DIAGNOSIS — R609 Edema, unspecified: Secondary | ICD-10-CM | POA: Diagnosis not present

## 2023-04-23 DIAGNOSIS — S42121S Displaced fracture of acromial process, right shoulder, sequela: Secondary | ICD-10-CM | POA: Diagnosis not present

## 2023-04-23 DIAGNOSIS — R2689 Other abnormalities of gait and mobility: Secondary | ICD-10-CM | POA: Diagnosis not present

## 2023-04-23 DIAGNOSIS — B379 Candidiasis, unspecified: Secondary | ICD-10-CM | POA: Diagnosis not present

## 2023-04-23 DIAGNOSIS — R52 Pain, unspecified: Secondary | ICD-10-CM | POA: Diagnosis not present

## 2023-04-23 DIAGNOSIS — K219 Gastro-esophageal reflux disease without esophagitis: Secondary | ICD-10-CM | POA: Diagnosis not present

## 2023-04-23 DIAGNOSIS — D333 Benign neoplasm of cranial nerves: Secondary | ICD-10-CM | POA: Diagnosis not present

## 2023-04-23 DIAGNOSIS — R1312 Dysphagia, oropharyngeal phase: Secondary | ICD-10-CM | POA: Diagnosis not present

## 2023-04-24 DIAGNOSIS — R2689 Other abnormalities of gait and mobility: Secondary | ICD-10-CM | POA: Diagnosis not present

## 2023-04-24 DIAGNOSIS — R52 Pain, unspecified: Secondary | ICD-10-CM | POA: Diagnosis not present

## 2023-04-24 DIAGNOSIS — R262 Difficulty in walking, not elsewhere classified: Secondary | ICD-10-CM | POA: Diagnosis not present

## 2023-04-24 DIAGNOSIS — S42121S Displaced fracture of acromial process, right shoulder, sequela: Secondary | ICD-10-CM | POA: Diagnosis not present

## 2023-04-24 DIAGNOSIS — D333 Benign neoplasm of cranial nerves: Secondary | ICD-10-CM | POA: Diagnosis not present

## 2023-04-24 DIAGNOSIS — B379 Candidiasis, unspecified: Secondary | ICD-10-CM | POA: Diagnosis not present

## 2023-04-24 DIAGNOSIS — R609 Edema, unspecified: Secondary | ICD-10-CM | POA: Diagnosis not present

## 2023-04-24 DIAGNOSIS — K219 Gastro-esophageal reflux disease without esophagitis: Secondary | ICD-10-CM | POA: Diagnosis not present

## 2023-04-24 DIAGNOSIS — R1312 Dysphagia, oropharyngeal phase: Secondary | ICD-10-CM | POA: Diagnosis not present

## 2023-04-25 DIAGNOSIS — R262 Difficulty in walking, not elsewhere classified: Secondary | ICD-10-CM | POA: Diagnosis not present

## 2023-04-25 DIAGNOSIS — R1312 Dysphagia, oropharyngeal phase: Secondary | ICD-10-CM | POA: Diagnosis not present

## 2023-04-25 DIAGNOSIS — S42121S Displaced fracture of acromial process, right shoulder, sequela: Secondary | ICD-10-CM | POA: Diagnosis not present

## 2023-04-25 DIAGNOSIS — B379 Candidiasis, unspecified: Secondary | ICD-10-CM | POA: Diagnosis not present

## 2023-04-25 DIAGNOSIS — M62838 Other muscle spasm: Secondary | ICD-10-CM | POA: Diagnosis not present

## 2023-04-25 DIAGNOSIS — R609 Edema, unspecified: Secondary | ICD-10-CM | POA: Diagnosis not present

## 2023-04-25 DIAGNOSIS — K219 Gastro-esophageal reflux disease without esophagitis: Secondary | ICD-10-CM | POA: Diagnosis not present

## 2023-04-25 DIAGNOSIS — R52 Pain, unspecified: Secondary | ICD-10-CM | POA: Diagnosis not present

## 2023-04-25 DIAGNOSIS — R2689 Other abnormalities of gait and mobility: Secondary | ICD-10-CM | POA: Diagnosis not present

## 2023-04-25 DIAGNOSIS — D333 Benign neoplasm of cranial nerves: Secondary | ICD-10-CM | POA: Diagnosis not present

## 2023-04-29 DIAGNOSIS — I10 Essential (primary) hypertension: Secondary | ICD-10-CM | POA: Diagnosis not present

## 2023-04-30 DIAGNOSIS — B379 Candidiasis, unspecified: Secondary | ICD-10-CM | POA: Diagnosis not present

## 2023-04-30 DIAGNOSIS — D333 Benign neoplasm of cranial nerves: Secondary | ICD-10-CM | POA: Diagnosis not present

## 2023-04-30 DIAGNOSIS — R262 Difficulty in walking, not elsewhere classified: Secondary | ICD-10-CM | POA: Diagnosis not present

## 2023-04-30 DIAGNOSIS — K219 Gastro-esophageal reflux disease without esophagitis: Secondary | ICD-10-CM | POA: Diagnosis not present

## 2023-05-01 DIAGNOSIS — R262 Difficulty in walking, not elsewhere classified: Secondary | ICD-10-CM | POA: Diagnosis not present

## 2023-05-01 DIAGNOSIS — R2689 Other abnormalities of gait and mobility: Secondary | ICD-10-CM | POA: Diagnosis not present

## 2023-05-01 DIAGNOSIS — R52 Pain, unspecified: Secondary | ICD-10-CM | POA: Diagnosis not present

## 2023-05-01 DIAGNOSIS — B379 Candidiasis, unspecified: Secondary | ICD-10-CM | POA: Diagnosis not present

## 2023-05-01 DIAGNOSIS — R609 Edema, unspecified: Secondary | ICD-10-CM | POA: Diagnosis not present

## 2023-05-01 DIAGNOSIS — D333 Benign neoplasm of cranial nerves: Secondary | ICD-10-CM | POA: Diagnosis not present

## 2023-05-01 DIAGNOSIS — R1312 Dysphagia, oropharyngeal phase: Secondary | ICD-10-CM | POA: Diagnosis not present

## 2023-05-01 DIAGNOSIS — S42121S Displaced fracture of acromial process, right shoulder, sequela: Secondary | ICD-10-CM | POA: Diagnosis not present

## 2023-05-01 DIAGNOSIS — K219 Gastro-esophageal reflux disease without esophagitis: Secondary | ICD-10-CM | POA: Diagnosis not present

## 2023-05-03 DIAGNOSIS — E1151 Type 2 diabetes mellitus with diabetic peripheral angiopathy without gangrene: Secondary | ICD-10-CM | POA: Diagnosis not present

## 2023-05-03 DIAGNOSIS — S42121S Displaced fracture of acromial process, right shoulder, sequela: Secondary | ICD-10-CM | POA: Diagnosis not present

## 2023-05-03 DIAGNOSIS — E875 Hyperkalemia: Secondary | ICD-10-CM | POA: Diagnosis not present

## 2023-05-03 DIAGNOSIS — E119 Type 2 diabetes mellitus without complications: Secondary | ICD-10-CM | POA: Diagnosis not present

## 2023-05-03 DIAGNOSIS — I5032 Chronic diastolic (congestive) heart failure: Secondary | ICD-10-CM | POA: Diagnosis not present

## 2023-05-03 DIAGNOSIS — J9601 Acute respiratory failure with hypoxia: Secondary | ICD-10-CM | POA: Diagnosis not present

## 2023-05-03 DIAGNOSIS — D333 Benign neoplasm of cranial nerves: Secondary | ICD-10-CM | POA: Diagnosis not present

## 2023-05-03 DIAGNOSIS — I2510 Atherosclerotic heart disease of native coronary artery without angina pectoris: Secondary | ICD-10-CM | POA: Diagnosis not present

## 2023-05-03 DIAGNOSIS — N182 Chronic kidney disease, stage 2 (mild): Secondary | ICD-10-CM | POA: Diagnosis not present

## 2023-05-03 DIAGNOSIS — R1314 Dysphagia, pharyngoesophageal phase: Secondary | ICD-10-CM | POA: Diagnosis not present

## 2023-05-03 DIAGNOSIS — I5030 Unspecified diastolic (congestive) heart failure: Secondary | ICD-10-CM | POA: Diagnosis not present

## 2023-05-05 DIAGNOSIS — I158 Other secondary hypertension: Secondary | ICD-10-CM | POA: Diagnosis not present

## 2023-05-08 DIAGNOSIS — R2689 Other abnormalities of gait and mobility: Secondary | ICD-10-CM | POA: Diagnosis not present

## 2023-05-08 DIAGNOSIS — S42121S Displaced fracture of acromial process, right shoulder, sequela: Secondary | ICD-10-CM | POA: Diagnosis not present

## 2023-05-08 DIAGNOSIS — R262 Difficulty in walking, not elsewhere classified: Secondary | ICD-10-CM | POA: Diagnosis not present

## 2023-05-08 DIAGNOSIS — R52 Pain, unspecified: Secondary | ICD-10-CM | POA: Diagnosis not present

## 2023-05-08 DIAGNOSIS — R1312 Dysphagia, oropharyngeal phase: Secondary | ICD-10-CM | POA: Diagnosis not present

## 2023-05-08 DIAGNOSIS — B379 Candidiasis, unspecified: Secondary | ICD-10-CM | POA: Diagnosis not present

## 2023-05-08 DIAGNOSIS — D333 Benign neoplasm of cranial nerves: Secondary | ICD-10-CM | POA: Diagnosis not present

## 2023-05-08 DIAGNOSIS — K219 Gastro-esophageal reflux disease without esophagitis: Secondary | ICD-10-CM | POA: Diagnosis not present

## 2023-05-08 DIAGNOSIS — R609 Edema, unspecified: Secondary | ICD-10-CM | POA: Diagnosis not present

## 2023-05-09 DIAGNOSIS — I10 Essential (primary) hypertension: Secondary | ICD-10-CM | POA: Diagnosis not present

## 2023-05-13 DIAGNOSIS — M62838 Other muscle spasm: Secondary | ICD-10-CM | POA: Diagnosis not present

## 2023-05-13 DIAGNOSIS — R2689 Other abnormalities of gait and mobility: Secondary | ICD-10-CM | POA: Diagnosis not present

## 2023-05-13 DIAGNOSIS — R52 Pain, unspecified: Secondary | ICD-10-CM | POA: Diagnosis not present

## 2023-05-13 DIAGNOSIS — K219 Gastro-esophageal reflux disease without esophagitis: Secondary | ICD-10-CM | POA: Diagnosis not present

## 2023-05-13 DIAGNOSIS — D333 Benign neoplasm of cranial nerves: Secondary | ICD-10-CM | POA: Diagnosis not present

## 2023-05-13 DIAGNOSIS — R609 Edema, unspecified: Secondary | ICD-10-CM | POA: Diagnosis not present

## 2023-05-13 DIAGNOSIS — R1312 Dysphagia, oropharyngeal phase: Secondary | ICD-10-CM | POA: Diagnosis not present

## 2023-05-13 DIAGNOSIS — B379 Candidiasis, unspecified: Secondary | ICD-10-CM | POA: Diagnosis not present

## 2023-05-13 DIAGNOSIS — R262 Difficulty in walking, not elsewhere classified: Secondary | ICD-10-CM | POA: Diagnosis not present

## 2023-05-13 DIAGNOSIS — S42121S Displaced fracture of acromial process, right shoulder, sequela: Secondary | ICD-10-CM | POA: Diagnosis not present

## 2023-05-15 DIAGNOSIS — R1312 Dysphagia, oropharyngeal phase: Secondary | ICD-10-CM | POA: Diagnosis not present

## 2023-05-15 DIAGNOSIS — R52 Pain, unspecified: Secondary | ICD-10-CM | POA: Diagnosis not present

## 2023-05-15 DIAGNOSIS — D333 Benign neoplasm of cranial nerves: Secondary | ICD-10-CM | POA: Diagnosis not present

## 2023-05-15 DIAGNOSIS — K219 Gastro-esophageal reflux disease without esophagitis: Secondary | ICD-10-CM | POA: Diagnosis not present

## 2023-05-15 DIAGNOSIS — R2689 Other abnormalities of gait and mobility: Secondary | ICD-10-CM | POA: Diagnosis not present

## 2023-05-15 DIAGNOSIS — R609 Edema, unspecified: Secondary | ICD-10-CM | POA: Diagnosis not present

## 2023-05-15 DIAGNOSIS — B379 Candidiasis, unspecified: Secondary | ICD-10-CM | POA: Diagnosis not present

## 2023-05-15 DIAGNOSIS — S42121S Displaced fracture of acromial process, right shoulder, sequela: Secondary | ICD-10-CM | POA: Diagnosis not present

## 2023-05-15 DIAGNOSIS — R262 Difficulty in walking, not elsewhere classified: Secondary | ICD-10-CM | POA: Diagnosis not present

## 2023-05-16 DIAGNOSIS — B379 Candidiasis, unspecified: Secondary | ICD-10-CM | POA: Diagnosis not present

## 2023-05-16 DIAGNOSIS — R52 Pain, unspecified: Secondary | ICD-10-CM | POA: Diagnosis not present

## 2023-05-16 DIAGNOSIS — R609 Edema, unspecified: Secondary | ICD-10-CM | POA: Diagnosis not present

## 2023-05-16 DIAGNOSIS — R2689 Other abnormalities of gait and mobility: Secondary | ICD-10-CM | POA: Diagnosis not present

## 2023-05-16 DIAGNOSIS — R262 Difficulty in walking, not elsewhere classified: Secondary | ICD-10-CM | POA: Diagnosis not present

## 2023-05-16 DIAGNOSIS — K219 Gastro-esophageal reflux disease without esophagitis: Secondary | ICD-10-CM | POA: Diagnosis not present

## 2023-05-16 DIAGNOSIS — R1312 Dysphagia, oropharyngeal phase: Secondary | ICD-10-CM | POA: Diagnosis not present

## 2023-05-16 DIAGNOSIS — S42121S Displaced fracture of acromial process, right shoulder, sequela: Secondary | ICD-10-CM | POA: Diagnosis not present

## 2023-05-16 DIAGNOSIS — D333 Benign neoplasm of cranial nerves: Secondary | ICD-10-CM | POA: Diagnosis not present

## 2023-05-17 DIAGNOSIS — S42121S Displaced fracture of acromial process, right shoulder, sequela: Secondary | ICD-10-CM | POA: Diagnosis not present

## 2023-05-17 DIAGNOSIS — R2689 Other abnormalities of gait and mobility: Secondary | ICD-10-CM | POA: Diagnosis not present

## 2023-05-17 DIAGNOSIS — R609 Edema, unspecified: Secondary | ICD-10-CM | POA: Diagnosis not present

## 2023-05-17 DIAGNOSIS — B379 Candidiasis, unspecified: Secondary | ICD-10-CM | POA: Diagnosis not present

## 2023-05-17 DIAGNOSIS — R262 Difficulty in walking, not elsewhere classified: Secondary | ICD-10-CM | POA: Diagnosis not present

## 2023-05-17 DIAGNOSIS — R52 Pain, unspecified: Secondary | ICD-10-CM | POA: Diagnosis not present

## 2023-05-17 DIAGNOSIS — K219 Gastro-esophageal reflux disease without esophagitis: Secondary | ICD-10-CM | POA: Diagnosis not present

## 2023-05-17 DIAGNOSIS — R1312 Dysphagia, oropharyngeal phase: Secondary | ICD-10-CM | POA: Diagnosis not present

## 2023-05-17 DIAGNOSIS — D333 Benign neoplasm of cranial nerves: Secondary | ICD-10-CM | POA: Diagnosis not present

## 2023-05-18 DIAGNOSIS — M25572 Pain in left ankle and joints of left foot: Secondary | ICD-10-CM | POA: Diagnosis not present

## 2023-05-18 DIAGNOSIS — R609 Edema, unspecified: Secondary | ICD-10-CM | POA: Diagnosis not present

## 2023-05-18 DIAGNOSIS — K219 Gastro-esophageal reflux disease without esophagitis: Secondary | ICD-10-CM | POA: Diagnosis not present

## 2023-05-18 DIAGNOSIS — S42121S Displaced fracture of acromial process, right shoulder, sequela: Secondary | ICD-10-CM | POA: Diagnosis not present

## 2023-05-18 DIAGNOSIS — D333 Benign neoplasm of cranial nerves: Secondary | ICD-10-CM | POA: Diagnosis not present

## 2023-05-18 DIAGNOSIS — R52 Pain, unspecified: Secondary | ICD-10-CM | POA: Diagnosis not present

## 2023-05-18 DIAGNOSIS — R262 Difficulty in walking, not elsewhere classified: Secondary | ICD-10-CM | POA: Diagnosis not present

## 2023-05-18 DIAGNOSIS — R1312 Dysphagia, oropharyngeal phase: Secondary | ICD-10-CM | POA: Diagnosis not present

## 2023-05-18 DIAGNOSIS — B379 Candidiasis, unspecified: Secondary | ICD-10-CM | POA: Diagnosis not present

## 2023-05-18 DIAGNOSIS — R2689 Other abnormalities of gait and mobility: Secondary | ICD-10-CM | POA: Diagnosis not present

## 2023-05-20 DIAGNOSIS — K219 Gastro-esophageal reflux disease without esophagitis: Secondary | ICD-10-CM | POA: Diagnosis not present

## 2023-05-20 DIAGNOSIS — S42121S Displaced fracture of acromial process, right shoulder, sequela: Secondary | ICD-10-CM | POA: Diagnosis not present

## 2023-05-20 DIAGNOSIS — R262 Difficulty in walking, not elsewhere classified: Secondary | ICD-10-CM | POA: Diagnosis not present

## 2023-05-20 DIAGNOSIS — R609 Edema, unspecified: Secondary | ICD-10-CM | POA: Diagnosis not present

## 2023-05-20 DIAGNOSIS — D333 Benign neoplasm of cranial nerves: Secondary | ICD-10-CM | POA: Diagnosis not present

## 2023-05-20 DIAGNOSIS — B379 Candidiasis, unspecified: Secondary | ICD-10-CM | POA: Diagnosis not present

## 2023-05-20 DIAGNOSIS — R52 Pain, unspecified: Secondary | ICD-10-CM | POA: Diagnosis not present

## 2023-05-20 DIAGNOSIS — R1312 Dysphagia, oropharyngeal phase: Secondary | ICD-10-CM | POA: Diagnosis not present

## 2023-05-20 DIAGNOSIS — R2689 Other abnormalities of gait and mobility: Secondary | ICD-10-CM | POA: Diagnosis not present

## 2023-05-24 DIAGNOSIS — N182 Chronic kidney disease, stage 2 (mild): Secondary | ICD-10-CM | POA: Diagnosis not present

## 2023-05-24 DIAGNOSIS — E1151 Type 2 diabetes mellitus with diabetic peripheral angiopathy without gangrene: Secondary | ICD-10-CM | POA: Diagnosis not present

## 2023-05-24 DIAGNOSIS — I2510 Atherosclerotic heart disease of native coronary artery without angina pectoris: Secondary | ICD-10-CM | POA: Diagnosis not present

## 2023-05-24 DIAGNOSIS — R1314 Dysphagia, pharyngoesophageal phase: Secondary | ICD-10-CM | POA: Diagnosis not present

## 2023-05-24 DIAGNOSIS — S42121S Displaced fracture of acromial process, right shoulder, sequela: Secondary | ICD-10-CM | POA: Diagnosis not present

## 2023-05-24 DIAGNOSIS — D333 Benign neoplasm of cranial nerves: Secondary | ICD-10-CM | POA: Diagnosis not present

## 2023-05-24 DIAGNOSIS — I5032 Chronic diastolic (congestive) heart failure: Secondary | ICD-10-CM | POA: Diagnosis not present

## 2023-05-24 DIAGNOSIS — E119 Type 2 diabetes mellitus without complications: Secondary | ICD-10-CM | POA: Diagnosis not present

## 2023-05-24 DIAGNOSIS — E875 Hyperkalemia: Secondary | ICD-10-CM | POA: Diagnosis not present

## 2023-05-24 DIAGNOSIS — J9601 Acute respiratory failure with hypoxia: Secondary | ICD-10-CM | POA: Diagnosis not present

## 2023-05-24 DIAGNOSIS — I5030 Unspecified diastolic (congestive) heart failure: Secondary | ICD-10-CM | POA: Diagnosis not present

## 2023-05-26 DIAGNOSIS — B379 Candidiasis, unspecified: Secondary | ICD-10-CM | POA: Diagnosis not present

## 2023-05-26 DIAGNOSIS — R609 Edema, unspecified: Secondary | ICD-10-CM | POA: Diagnosis not present

## 2023-05-26 DIAGNOSIS — K219 Gastro-esophageal reflux disease without esophagitis: Secondary | ICD-10-CM | POA: Diagnosis not present

## 2023-05-26 DIAGNOSIS — R2689 Other abnormalities of gait and mobility: Secondary | ICD-10-CM | POA: Diagnosis not present

## 2023-05-26 DIAGNOSIS — R262 Difficulty in walking, not elsewhere classified: Secondary | ICD-10-CM | POA: Diagnosis not present

## 2023-05-26 DIAGNOSIS — S42121S Displaced fracture of acromial process, right shoulder, sequela: Secondary | ICD-10-CM | POA: Diagnosis not present

## 2023-05-26 DIAGNOSIS — D333 Benign neoplasm of cranial nerves: Secondary | ICD-10-CM | POA: Diagnosis not present

## 2023-05-26 DIAGNOSIS — R1312 Dysphagia, oropharyngeal phase: Secondary | ICD-10-CM | POA: Diagnosis not present

## 2023-05-26 DIAGNOSIS — R52 Pain, unspecified: Secondary | ICD-10-CM | POA: Diagnosis not present

## 2023-06-03 DIAGNOSIS — L0101 Non-bullous impetigo: Secondary | ICD-10-CM | POA: Diagnosis not present

## 2023-06-03 DIAGNOSIS — L2989 Other pruritus: Secondary | ICD-10-CM | POA: Diagnosis not present

## 2023-06-05 DIAGNOSIS — R1314 Dysphagia, pharyngoesophageal phase: Secondary | ICD-10-CM | POA: Diagnosis not present

## 2023-06-06 DIAGNOSIS — I158 Other secondary hypertension: Secondary | ICD-10-CM | POA: Diagnosis not present

## 2023-06-06 DIAGNOSIS — D333 Benign neoplasm of cranial nerves: Secondary | ICD-10-CM | POA: Diagnosis not present

## 2023-06-06 DIAGNOSIS — R262 Difficulty in walking, not elsewhere classified: Secondary | ICD-10-CM | POA: Diagnosis not present

## 2023-06-06 DIAGNOSIS — R1314 Dysphagia, pharyngoesophageal phase: Secondary | ICD-10-CM | POA: Diagnosis not present

## 2023-06-06 DIAGNOSIS — K219 Gastro-esophageal reflux disease without esophagitis: Secondary | ICD-10-CM | POA: Diagnosis not present

## 2023-06-06 DIAGNOSIS — B379 Candidiasis, unspecified: Secondary | ICD-10-CM | POA: Diagnosis not present

## 2023-06-08 DIAGNOSIS — R262 Difficulty in walking, not elsewhere classified: Secondary | ICD-10-CM | POA: Diagnosis not present

## 2023-06-08 DIAGNOSIS — B379 Candidiasis, unspecified: Secondary | ICD-10-CM | POA: Diagnosis not present

## 2023-06-08 DIAGNOSIS — K219 Gastro-esophageal reflux disease without esophagitis: Secondary | ICD-10-CM | POA: Diagnosis not present

## 2023-06-08 DIAGNOSIS — D333 Benign neoplasm of cranial nerves: Secondary | ICD-10-CM | POA: Diagnosis not present

## 2023-06-09 DIAGNOSIS — R1314 Dysphagia, pharyngoesophageal phase: Secondary | ICD-10-CM | POA: Diagnosis not present

## 2023-06-10 DIAGNOSIS — R1314 Dysphagia, pharyngoesophageal phase: Secondary | ICD-10-CM | POA: Diagnosis not present

## 2023-06-11 DIAGNOSIS — R1314 Dysphagia, pharyngoesophageal phase: Secondary | ICD-10-CM | POA: Diagnosis not present

## 2023-06-13 DIAGNOSIS — B379 Candidiasis, unspecified: Secondary | ICD-10-CM | POA: Diagnosis not present

## 2023-06-13 DIAGNOSIS — R1314 Dysphagia, pharyngoesophageal phase: Secondary | ICD-10-CM | POA: Diagnosis not present

## 2023-06-13 DIAGNOSIS — R52 Pain, unspecified: Secondary | ICD-10-CM | POA: Diagnosis not present

## 2023-06-13 DIAGNOSIS — R1312 Dysphagia, oropharyngeal phase: Secondary | ICD-10-CM | POA: Diagnosis not present

## 2023-06-13 DIAGNOSIS — R2689 Other abnormalities of gait and mobility: Secondary | ICD-10-CM | POA: Diagnosis not present

## 2023-06-13 DIAGNOSIS — R609 Edema, unspecified: Secondary | ICD-10-CM | POA: Diagnosis not present

## 2023-06-13 DIAGNOSIS — K219 Gastro-esophageal reflux disease without esophagitis: Secondary | ICD-10-CM | POA: Diagnosis not present

## 2023-06-13 DIAGNOSIS — S42121S Displaced fracture of acromial process, right shoulder, sequela: Secondary | ICD-10-CM | POA: Diagnosis not present

## 2023-06-13 DIAGNOSIS — R262 Difficulty in walking, not elsewhere classified: Secondary | ICD-10-CM | POA: Diagnosis not present

## 2023-06-13 DIAGNOSIS — D333 Benign neoplasm of cranial nerves: Secondary | ICD-10-CM | POA: Diagnosis not present

## 2023-06-16 DIAGNOSIS — R262 Difficulty in walking, not elsewhere classified: Secondary | ICD-10-CM | POA: Diagnosis not present

## 2023-06-16 DIAGNOSIS — K219 Gastro-esophageal reflux disease without esophagitis: Secondary | ICD-10-CM | POA: Diagnosis not present

## 2023-06-16 DIAGNOSIS — D333 Benign neoplasm of cranial nerves: Secondary | ICD-10-CM | POA: Diagnosis not present

## 2023-06-16 DIAGNOSIS — R52 Pain, unspecified: Secondary | ICD-10-CM | POA: Diagnosis not present

## 2023-06-16 DIAGNOSIS — R609 Edema, unspecified: Secondary | ICD-10-CM | POA: Diagnosis not present

## 2023-06-16 DIAGNOSIS — M62831 Muscle spasm of calf: Secondary | ICD-10-CM | POA: Diagnosis not present

## 2023-06-16 DIAGNOSIS — R1314 Dysphagia, pharyngoesophageal phase: Secondary | ICD-10-CM | POA: Diagnosis not present

## 2023-06-16 DIAGNOSIS — B379 Candidiasis, unspecified: Secondary | ICD-10-CM | POA: Diagnosis not present

## 2023-06-16 DIAGNOSIS — R1312 Dysphagia, oropharyngeal phase: Secondary | ICD-10-CM | POA: Diagnosis not present

## 2023-06-16 DIAGNOSIS — S42121S Displaced fracture of acromial process, right shoulder, sequela: Secondary | ICD-10-CM | POA: Diagnosis not present

## 2023-06-16 DIAGNOSIS — R2689 Other abnormalities of gait and mobility: Secondary | ICD-10-CM | POA: Diagnosis not present

## 2023-06-17 DIAGNOSIS — R52 Pain, unspecified: Secondary | ICD-10-CM | POA: Diagnosis not present

## 2023-06-17 DIAGNOSIS — R262 Difficulty in walking, not elsewhere classified: Secondary | ICD-10-CM | POA: Diagnosis not present

## 2023-06-17 DIAGNOSIS — S42121S Displaced fracture of acromial process, right shoulder, sequela: Secondary | ICD-10-CM | POA: Diagnosis not present

## 2023-06-17 DIAGNOSIS — R1312 Dysphagia, oropharyngeal phase: Secondary | ICD-10-CM | POA: Diagnosis not present

## 2023-06-17 DIAGNOSIS — R609 Edema, unspecified: Secondary | ICD-10-CM | POA: Diagnosis not present

## 2023-06-17 DIAGNOSIS — R1314 Dysphagia, pharyngoesophageal phase: Secondary | ICD-10-CM | POA: Diagnosis not present

## 2023-06-17 DIAGNOSIS — K219 Gastro-esophageal reflux disease without esophagitis: Secondary | ICD-10-CM | POA: Diagnosis not present

## 2023-06-17 DIAGNOSIS — B379 Candidiasis, unspecified: Secondary | ICD-10-CM | POA: Diagnosis not present

## 2023-06-17 DIAGNOSIS — R2689 Other abnormalities of gait and mobility: Secondary | ICD-10-CM | POA: Diagnosis not present

## 2023-06-18 DIAGNOSIS — R1314 Dysphagia, pharyngoesophageal phase: Secondary | ICD-10-CM | POA: Diagnosis not present

## 2023-06-19 DIAGNOSIS — R1314 Dysphagia, pharyngoesophageal phase: Secondary | ICD-10-CM | POA: Diagnosis not present

## 2023-06-20 DIAGNOSIS — N390 Urinary tract infection, site not specified: Secondary | ICD-10-CM | POA: Diagnosis not present

## 2023-06-20 DIAGNOSIS — R1314 Dysphagia, pharyngoesophageal phase: Secondary | ICD-10-CM | POA: Diagnosis not present

## 2023-06-23 DIAGNOSIS — R1312 Dysphagia, oropharyngeal phase: Secondary | ICD-10-CM | POA: Diagnosis not present

## 2023-06-23 DIAGNOSIS — B379 Candidiasis, unspecified: Secondary | ICD-10-CM | POA: Diagnosis not present

## 2023-06-23 DIAGNOSIS — R609 Edema, unspecified: Secondary | ICD-10-CM | POA: Diagnosis not present

## 2023-06-23 DIAGNOSIS — R52 Pain, unspecified: Secondary | ICD-10-CM | POA: Diagnosis not present

## 2023-06-23 DIAGNOSIS — R1314 Dysphagia, pharyngoesophageal phase: Secondary | ICD-10-CM | POA: Diagnosis not present

## 2023-06-23 DIAGNOSIS — K219 Gastro-esophageal reflux disease without esophagitis: Secondary | ICD-10-CM | POA: Diagnosis not present

## 2023-06-23 DIAGNOSIS — R262 Difficulty in walking, not elsewhere classified: Secondary | ICD-10-CM | POA: Diagnosis not present

## 2023-06-23 DIAGNOSIS — D333 Benign neoplasm of cranial nerves: Secondary | ICD-10-CM | POA: Diagnosis not present

## 2023-06-23 DIAGNOSIS — S42121S Displaced fracture of acromial process, right shoulder, sequela: Secondary | ICD-10-CM | POA: Diagnosis not present

## 2023-06-23 DIAGNOSIS — R2689 Other abnormalities of gait and mobility: Secondary | ICD-10-CM | POA: Diagnosis not present

## 2023-06-24 DIAGNOSIS — R1314 Dysphagia, pharyngoesophageal phase: Secondary | ICD-10-CM | POA: Diagnosis not present

## 2023-06-25 DIAGNOSIS — R1314 Dysphagia, pharyngoesophageal phase: Secondary | ICD-10-CM | POA: Diagnosis not present

## 2023-06-25 DIAGNOSIS — R52 Pain, unspecified: Secondary | ICD-10-CM | POA: Diagnosis not present

## 2023-06-25 DIAGNOSIS — R1312 Dysphagia, oropharyngeal phase: Secondary | ICD-10-CM | POA: Diagnosis not present

## 2023-06-25 DIAGNOSIS — S42121S Displaced fracture of acromial process, right shoulder, sequela: Secondary | ICD-10-CM | POA: Diagnosis not present

## 2023-06-25 DIAGNOSIS — K219 Gastro-esophageal reflux disease without esophagitis: Secondary | ICD-10-CM | POA: Diagnosis not present

## 2023-06-25 DIAGNOSIS — R2689 Other abnormalities of gait and mobility: Secondary | ICD-10-CM | POA: Diagnosis not present

## 2023-06-25 DIAGNOSIS — R609 Edema, unspecified: Secondary | ICD-10-CM | POA: Diagnosis not present

## 2023-06-25 DIAGNOSIS — D333 Benign neoplasm of cranial nerves: Secondary | ICD-10-CM | POA: Diagnosis not present

## 2023-06-25 DIAGNOSIS — R262 Difficulty in walking, not elsewhere classified: Secondary | ICD-10-CM | POA: Diagnosis not present

## 2023-06-25 DIAGNOSIS — B379 Candidiasis, unspecified: Secondary | ICD-10-CM | POA: Diagnosis not present

## 2023-06-26 DIAGNOSIS — R1314 Dysphagia, pharyngoesophageal phase: Secondary | ICD-10-CM | POA: Diagnosis not present

## 2023-06-27 DIAGNOSIS — R1314 Dysphagia, pharyngoesophageal phase: Secondary | ICD-10-CM | POA: Diagnosis not present

## 2023-06-27 DIAGNOSIS — R609 Edema, unspecified: Secondary | ICD-10-CM | POA: Diagnosis not present

## 2023-06-27 DIAGNOSIS — R262 Difficulty in walking, not elsewhere classified: Secondary | ICD-10-CM | POA: Diagnosis not present

## 2023-06-27 DIAGNOSIS — S42121S Displaced fracture of acromial process, right shoulder, sequela: Secondary | ICD-10-CM | POA: Diagnosis not present

## 2023-06-27 DIAGNOSIS — R52 Pain, unspecified: Secondary | ICD-10-CM | POA: Diagnosis not present

## 2023-06-27 DIAGNOSIS — K219 Gastro-esophageal reflux disease without esophagitis: Secondary | ICD-10-CM | POA: Diagnosis not present

## 2023-06-27 DIAGNOSIS — R1312 Dysphagia, oropharyngeal phase: Secondary | ICD-10-CM | POA: Diagnosis not present

## 2023-06-27 DIAGNOSIS — R2689 Other abnormalities of gait and mobility: Secondary | ICD-10-CM | POA: Diagnosis not present

## 2023-06-27 DIAGNOSIS — B379 Candidiasis, unspecified: Secondary | ICD-10-CM | POA: Diagnosis not present

## 2023-06-30 DIAGNOSIS — R1314 Dysphagia, pharyngoesophageal phase: Secondary | ICD-10-CM | POA: Diagnosis not present

## 2023-07-01 DIAGNOSIS — R1314 Dysphagia, pharyngoesophageal phase: Secondary | ICD-10-CM | POA: Diagnosis not present

## 2023-07-02 DIAGNOSIS — R1314 Dysphagia, pharyngoesophageal phase: Secondary | ICD-10-CM | POA: Diagnosis not present

## 2023-07-03 DIAGNOSIS — R1314 Dysphagia, pharyngoesophageal phase: Secondary | ICD-10-CM | POA: Diagnosis not present

## 2023-07-07 DIAGNOSIS — E119 Type 2 diabetes mellitus without complications: Secondary | ICD-10-CM | POA: Diagnosis not present

## 2023-07-10 DIAGNOSIS — R609 Edema, unspecified: Secondary | ICD-10-CM | POA: Diagnosis not present

## 2023-07-10 DIAGNOSIS — D333 Benign neoplasm of cranial nerves: Secondary | ICD-10-CM | POA: Diagnosis not present

## 2023-07-10 DIAGNOSIS — R52 Pain, unspecified: Secondary | ICD-10-CM | POA: Diagnosis not present

## 2023-07-10 DIAGNOSIS — R1312 Dysphagia, oropharyngeal phase: Secondary | ICD-10-CM | POA: Diagnosis not present

## 2023-07-10 DIAGNOSIS — B379 Candidiasis, unspecified: Secondary | ICD-10-CM | POA: Diagnosis not present

## 2023-07-10 DIAGNOSIS — R262 Difficulty in walking, not elsewhere classified: Secondary | ICD-10-CM | POA: Diagnosis not present

## 2023-07-10 DIAGNOSIS — S42121S Displaced fracture of acromial process, right shoulder, sequela: Secondary | ICD-10-CM | POA: Diagnosis not present

## 2023-07-10 DIAGNOSIS — R2689 Other abnormalities of gait and mobility: Secondary | ICD-10-CM | POA: Diagnosis not present

## 2023-07-10 DIAGNOSIS — K219 Gastro-esophageal reflux disease without esophagitis: Secondary | ICD-10-CM | POA: Diagnosis not present

## 2023-07-11 DIAGNOSIS — R2689 Other abnormalities of gait and mobility: Secondary | ICD-10-CM | POA: Diagnosis not present

## 2023-07-11 DIAGNOSIS — B379 Candidiasis, unspecified: Secondary | ICD-10-CM | POA: Diagnosis not present

## 2023-07-11 DIAGNOSIS — R262 Difficulty in walking, not elsewhere classified: Secondary | ICD-10-CM | POA: Diagnosis not present

## 2023-07-11 DIAGNOSIS — I509 Heart failure, unspecified: Secondary | ICD-10-CM | POA: Diagnosis not present

## 2023-07-11 DIAGNOSIS — K219 Gastro-esophageal reflux disease without esophagitis: Secondary | ICD-10-CM | POA: Diagnosis not present

## 2023-07-11 DIAGNOSIS — R52 Pain, unspecified: Secondary | ICD-10-CM | POA: Diagnosis not present

## 2023-07-11 DIAGNOSIS — S42121S Displaced fracture of acromial process, right shoulder, sequela: Secondary | ICD-10-CM | POA: Diagnosis not present

## 2023-07-11 DIAGNOSIS — R1312 Dysphagia, oropharyngeal phase: Secondary | ICD-10-CM | POA: Diagnosis not present

## 2023-07-11 DIAGNOSIS — R609 Edema, unspecified: Secondary | ICD-10-CM | POA: Diagnosis not present

## 2023-07-11 DIAGNOSIS — D333 Benign neoplasm of cranial nerves: Secondary | ICD-10-CM | POA: Diagnosis not present

## 2023-07-14 ENCOUNTER — Ambulatory Visit (INDEPENDENT_AMBULATORY_CARE_PROVIDER_SITE_OTHER): Admitting: Gastroenterology

## 2023-07-14 VITALS — BP 94/50 | HR 60 | Ht 61.75 in | Wt 179.5 lb

## 2023-07-14 DIAGNOSIS — R1319 Other dysphagia: Secondary | ICD-10-CM

## 2023-07-14 DIAGNOSIS — R609 Edema, unspecified: Secondary | ICD-10-CM | POA: Diagnosis not present

## 2023-07-14 DIAGNOSIS — D333 Benign neoplasm of cranial nerves: Secondary | ICD-10-CM | POA: Diagnosis not present

## 2023-07-14 DIAGNOSIS — R1312 Dysphagia, oropharyngeal phase: Secondary | ICD-10-CM | POA: Diagnosis not present

## 2023-07-14 DIAGNOSIS — R262 Difficulty in walking, not elsewhere classified: Secondary | ICD-10-CM | POA: Diagnosis not present

## 2023-07-14 DIAGNOSIS — K219 Gastro-esophageal reflux disease without esophagitis: Secondary | ICD-10-CM | POA: Diagnosis not present

## 2023-07-14 DIAGNOSIS — R52 Pain, unspecified: Secondary | ICD-10-CM | POA: Diagnosis not present

## 2023-07-14 DIAGNOSIS — S42121S Displaced fracture of acromial process, right shoulder, sequela: Secondary | ICD-10-CM | POA: Diagnosis not present

## 2023-07-14 DIAGNOSIS — R2689 Other abnormalities of gait and mobility: Secondary | ICD-10-CM | POA: Diagnosis not present

## 2023-07-14 DIAGNOSIS — K5909 Other constipation: Secondary | ICD-10-CM

## 2023-07-14 DIAGNOSIS — R1310 Dysphagia, unspecified: Secondary | ICD-10-CM

## 2023-07-14 DIAGNOSIS — B379 Candidiasis, unspecified: Secondary | ICD-10-CM | POA: Diagnosis not present

## 2023-07-14 MED ORDER — PANTOPRAZOLE SODIUM 40 MG PO TBEC: 40.0000 mg | DELAYED_RELEASE_TABLET | Freq: Two times a day (BID) | ORAL | 1 refills | Status: DC

## 2023-07-14 NOTE — Patient Instructions (Addendum)
 GERD/problems swallowing Recommend GERD diet, no late meals 3-4 hours before lying down Recommend dysphagia precautions Will increase the pantoprazole  40mg  to twice daily 1 tablet 30-45 minutes before breakfast and dinner  Constipation Can't take the Miralax as needed for constipation  You have been scheduled for a Barium Esophogram at [facility] Radiology (1st floor of the hospital) on 08/04/23 at 10:00am. Please arrive 30 minutes prior to your appointment for registration. Make certain not to have anything to eat or drink 3 hours prior to your test. If you need to reschedule for any reason, please contact radiology at [phone] to do so. __________________________________________________________________ A barium swallow is an examination that concentrates on views of the esophagus. This tends to be a double contrast exam (barium and two liquids which, when combined, create a gas to distend the wall of the oesophagus) or single contrast (non-ionic iodine based). The study is usually tailored to your symptoms so a good history is essential. Attention is paid during the study to the form, structure and configuration of the esophagus, looking for functional disorders (such as aspiration, dysphagia, achalasia, motility and reflux) EXAMINATION You may be asked to change into a gown, depending on the type of swallow being performed. A radiologist and radiographer will perform the procedure. The radiologist will advise you of the type of contrast selected for your procedure and direct you during the exam. You will be asked to stand, sit or lie in several different positions and to hold a small amount of fluid in your mouth before being asked to swallow while the imaging is performed .In some instances you may be asked to swallow barium coated marshmallows to assess the motility of a solid food bolus. The exam can be recorded as a digital or video fluoroscopy procedure. POST PROCEDURE It will take 1-2  days for the barium to pass through your system. To facilitate this, it is important, unless otherwise directed, to increase your fluids for the next 24-48hrs and to resume your normal diet.  This test typically takes about 30 minutes to perform. __________________________________________________________________________________  _______________________________________________________  If your blood pressure at your visit was 140/90 or greater, please contact your primary care physician to follow up on this.  _______________________________________________________  If you are age 82 or older, your body mass index should be between 23-30. Your Body mass index is 33.1 kg/m. If this is out of the aforementioned range listed, please consider follow up with your Primary Care Provider.  If you are age 37 or younger, your body mass index should be between 19-25. Your Body mass index is 33.1 kg/m. If this is out of the aformentioned range listed, please consider follow up with your Primary Care Provider.   ________________________________________________________  The [facility] providers would like to encourage you to use MYCHART to communicate with providers for non-urgent requests or questions.  Due to long hold times on the telephone, sending your provider a message by [facility] may be a faster and more efficient way to get a response.  Please allow 48 business hours for a response.  Please remember that this is for non-urgent requests.  _______________________________________________________  Thank you for trusting me with your gastrointestinal care. Deanna May, RNP

## 2023-07-14 NOTE — Progress Notes (Signed)
 Chief Complaint:esophageal dysmotility  Primary GI Doctor: Dr. Charlanne  HPI:  Patient is a  83  year old female patient with past medical history of GERD, hypertension, DM, CHF, who was referred to me by Darreld Agent, DO on 07/08/23 for a complaint of esophageal dysmotility  .    Interval History    Patient presents for evaluation of GERD and intermittent esophageal dysphagia. Patient has history of GERD and currently taking Pantoprazole  40 mg po daily.  Patient presents with main complaint of esophageal dysphagia with solids only for she states over a year now. She has regurgitated food up such as sausage. Patient living at [facility] and rehab, in a wheelchair. She can transfer to bed/chair. She admits to decreased appetite, but reports the food at the rehab doesn't taste good. No nausea or vomiting.   She recalls she Garhett Bernhard have had endoscopy several years ago with Dr. Gladis Louder. She is unsure if they dilated her esophagus. She has also had speech therapy in the past for dysphagia, but also states it has been a long time.  Patient has bowel movement every 1-2 days. She uses OTC Miralax as needed. No blood in stool.   No history of stroke.   She uses oxygen  therapy 2L when laying in bed and prn.   Patient taking baby ASA 81 mg po daily.   Nonsmoker. No alcohol  use.   She has one brother who is a doctor in [facility] and one friend of over 30 years who lives near by and helps her as needed.   No family history of GI issues or CA.   Wt Readings from Last 3 Encounters:  07/14/23 179 lb 8 oz (81.4 kg)  02/04/23 176 lb (79.8 kg)  01/22/23 176 lb (79.8 kg)    Past Medical History:  Diagnosis Date   Acute kidney injury (HCC) 08/05/2016   Aftercare following surgery of the circulatory system, NEC 05/11/2013   AKI (acute kidney injury) (HCC) 08/04/2016   Anxiety    takes Celexa  daily   ARF (acute renal failure) (HCC) 11/07/2016   Asymptomatic stenosis of right carotid artery  03/22/2016   Back pain    occasionally   Carotid artery disease (HCC) 04/16/2013   Carotid artery occlusion    Carotid stenosis 11/16/2013   Cataract    left and immature   Coronary artery disease    Coronary atherosclerosis of native coronary artery 03/11/2013   S/p CABG in 1997    Depression    Diabetes mellitus    takes Metformin  and Glipizide  daily   Diabetes mellitus (HCC) 05/02/2015   Dizziness    takes Meclizine  daily as needed   Essential hypertension, benign 03/11/2013   GERD (gastroesophageal reflux disease)    takes Omeprazole daily as needed   Headache(784.0)    Hyperlipidemia    takes Atorvastatin  daily   Hypertension    takes Carvedilol  daily   Mixed hyperlipidemia 03/11/2013   Muscle spasm    takes Robaxin  daily as needed   Nausea    takes Phenergan  daily as needed   Occlusion and stenosis of carotid artery without mention of cerebral infarction 07/19/2011   Pneumonia    hx of-in high school   Restless leg    takes Requip  daily as needed   Seasonal allergies    takes Claritin  daily as needed and Afrin as needed   Shortness of breath    with exertion   Urinary urgency    UTI (urinary tract  infection) 11/07/2016    Past Surgical History:  Procedure Laterality Date   COLONOSCOPY WITH PROPOFOL  N/A 03/10/2012   Procedure: COLONOSCOPY WITH PROPOFOL ;  Surgeon: Gladis MARLA Louder, MD;  Location: [facility];  Service: Endoscopy;  Laterality: N/A;   CORNEAL TRANSPLANT Right    CORONARY ARTERY BYPASS GRAFT  1997   x 6   CORONARY ARTERY BYPASS GRAFT  Jan. 1997   ENDARTERECTOMY Left 04/16/2013   Procedure: Left Carotid Artery Endatarectomy with Resection of Redundant Internal Carotid Artery;  Surgeon: Krystal JULIANNA Doing, MD;  [facility];  Service: Vascular;  Laterality: Left;   ENDARTERECTOMY Right 03/22/2016   Procedure: RIGHT ENDARTERECTOMY CAROTID;  Surgeon: Krystal JULIANNA Doing, MD;  Location: [facility] OR;  Service: Vascular;  Laterality: Right;   ESOPHAGOGASTRODUODENOSCOPY N/A  03/10/2012   Procedure: ESOPHAGOGASTRODUODENOSCOPY (EGD);  Surgeon: Gladis MARLA Louder, MD;  Location: [facility];  Service: Endoscopy;  Laterality: N/A;   EYE SURGERY  March 12, 2001   CORNEA TRANSPLANT Right eye   PATCH ANGIOPLASTY Right 03/22/2016   Procedure: PATCH ANGIOPLASTY;  Surgeon: Krystal JULIANNA Doing, MD;  Location: [facility] OR;  Service: Vascular;  Laterality: Right;   PR VEIN BYPASS GRAFT,AORTO-FEM-POP  1997   SPINE SURGERY  march 2013   Back surgery   TONSILLECTOMY     TRIGGER FINGER RELEASE Left    thumb    Current Outpatient Medications  Medication Sig Dispense Refill   acetaminophen  (TYLENOL ) 500 MG tablet Take 1,000 mg by mouth at bedtime.     amLODipine  (NORVASC ) 10 MG tablet Take 10 mg by mouth daily.     aspirin  EC 81 MG tablet Take 81 mg by mouth every evening.      busPIRone  (BUSPAR ) 10 MG tablet Take 10 mg by mouth 2 (two) times daily.     calcium  carbonate (TUMS - DOSED IN MG ELEMENTAL CALCIUM ) 500 MG chewable tablet Chew 1 tablet by mouth 3 (three) times daily with meals.     carvedilol  (COREG ) 12.5 MG tablet Take 1 tablet (12.5 mg total) by mouth 2 (two) times daily. 60 tablet 2   citalopram  (CELEXA ) 20 MG tablet Take 20 mg by mouth daily.     diphenhydrAMINE -zinc acetate (BENADRYL ) cream Apply 1 Application topically daily as needed for itching.     docusate sodium  (COLACE) 100 MG capsule Take 100 mg by mouth 2 (two) times daily.     empagliflozin (JARDIANCE) 25 MG TABS tablet Take 25 mg by mouth daily.     estradiol (ESTRACE) 0.1 MG/GM vaginal cream Place 1 Applicatorful vaginally at bedtime. Twice daily for pain     ferrous sulfate  325 (65 FE) MG tablet Take 325 mg by mouth daily. Monday-Friday     fluticasone (CUTIVATE) 0.005 % ointment Apply 1 Application topically as needed.     furosemide  (LASIX ) 20 MG tablet Take 1 tablet (20 mg) daily for 3 days, then take once daily as needed for weight gain of 2-3 lbs overnight or 5 lbs in 1 week. (Patient taking differently: Take 20  mg by mouth 2 (two) times daily. Take 1 tablet (20 mg) daily for 3 days, then take once daily as needed for weight gain of 2-3 lbs overnight or 5 lbs in 1 week.) 90 tablet 3   gabapentin  (NEURONTIN ) 300 MG capsule Take 300 mg by mouth 3 (three) times daily.     glipiZIDE  (GLUCOTROL  XL) 5 MG 24 hr tablet Take 1 tablet (5 mg total) by mouth daily with breakfast. 30 tablet 0  HYDROcodone -acetaminophen  (NORCO) 10-325 MG tablet Take 1 tablet by mouth in the morning and at bedtime. (Patient taking differently: Take 1 tablet by mouth in the morning, at noon, and at bedtime.)     HYDROcodone -acetaminophen  (NORCO/VICODIN) 5-325 MG tablet Take 1 tablet by mouth daily. At 1pm     hydrOXYzine  (ATARAX ) 25 MG tablet Take 25 mg by mouth every 6 (six) hours as needed.     levocetirizine (XYZAL) 5 MG tablet Take 5 mg by mouth every evening.     magnesium  oxide (MAG-OX) 400 (240 Mg) MG tablet Take 400 mg by mouth at bedtime.     meclizine  (ANTIVERT ) 25 MG tablet Take 25 mg by mouth 3 (three) times daily.     melatonin 3 MG TABS tablet Take 3 mg by mouth at bedtime.     methocarbamol  (ROBAXIN ) 750 MG tablet Take 750 mg by mouth 2 (two) times daily.     mupirocin  ointment (BACTROBAN ) 2 % Apply 1 Application topically as needed.     nystatin powder Apply 1 Application topically 2 (two) times daily.     oxybutynin  (DITROPAN -XL) 10 MG 24 hr tablet Take 10 mg by mouth daily.     OXYGEN  Inhale 2 L/min into the lungs continuous.     pantoprazole  (PROTONIX ) 40 MG tablet Take 40 mg by mouth daily.     pantoprazole  (PROTONIX ) 40 MG tablet Take 1 tablet (40 mg total) by mouth 2 (two) times daily. 180 tablet 1   rosuvastatin  (CRESTOR ) 10 MG tablet Take 10 mg by mouth daily.     saccharomyces boulardii (FLORASTOR) 250 MG capsule Take 250 mg by mouth daily.     senna (SENOKOT) 8.6 MG tablet Take 2 tablets by mouth at bedtime.     sodium zirconium cyclosilicate  (LOKELMA ) 10 g PACK packet Take 10 g by mouth daily.     ticagrelor   (BRILINTA ) 90 MG TABS tablet Take 1 tablet (90 mg total) by mouth 2 (two) times daily. 60 tablet 0   valsartan (DIOVAN) 80 MG tablet Take 80 mg by mouth daily.     No current facility-administered medications for this visit.    Allergies as of 07/14/2023 - Review Complete 07/14/2023  Allergen Reaction Noted   Latex Other (See Comments), Rash, and Hives 03/08/2011   Codeine Other (See Comments) 03/08/2011   Morphine  and codeine Other (See Comments) 05/20/2011   Cyclobenzaprine Other (See Comments) 11/07/2016   Methocarbamol  Other (See Comments) 11/07/2016   Morphine  Other (See Comments) 03/08/2011    Family History  Problem Relation Age of Onset   Heart attack Father    Heart disease Father 49       Before age of 19   Hyperlipidemia Father    Heart disease Brother        Heart dissease before age 35   Hyperlipidemia Brother    Cirrhosis Mother    Heart attack Paternal Grandfather    Heart disease Paternal Grandfather    Cancer Maternal Grandmother    Alcoholism Maternal Grandfather    Heart attack Paternal Grandmother     Review of Systems:    Constitutional: No weight loss, fever, chills, weakness or fatigue HEENT: Eyes: No change in vision               Ears, Nose, Throat:  No change in hearing or congestion Skin: No rash or itching Cardiovascular: No chest pain, chest pressure or palpitations   Respiratory: No SOB or cough Gastrointestinal: See HPI and otherwise negative  Genitourinary: No dysuria or change in urinary frequency Neurological: No headache, dizziness or syncope Musculoskeletal: No new muscle or joint pain Hematologic: No bleeding or bruising Psychiatric: No history of depression or anxiety    Physical Exam:  Vital signs: BP (!) 94/50 (BP Location: Left Arm, Patient Position: Sitting, Cuff Size: Normal)   Pulse 60   Ht 5' 1.75 (1.568 m)   Wt 179 lb 8 oz (81.4 kg)   BMI 33.10 kg/m   Constitutional:   Pleasant  female appears to be in NAD, Well  developed, Well nourished, alert and cooperative Throat: Oral cavity and pharynx without inflammation, swelling or lesion.  Respiratory: Respirations even and unlabored. Lungs clear to auscultation bilaterally.   No wheezes, crackles, or rhonchi.  Cardiovascular: Normal S1, S2. Regular rate and rhythm. No peripheral edema, cyanosis or pallor.  Gastrointestinal:  Soft, nondistended, nontender. No rebound or guarding. Normal bowel sounds. No appreciable masses or hepatomegaly. Rectal:  Not performed.  Msk:  patient in wheelchair Neurologic:  Alert and  oriented x4;  grossly normal neurologically.  Skin:   Dry and intact without significant lesions or rashes. Psychiatric: Oriented to person, place and time. Demonstrates good judgement and reason without abnormal affect or behaviors.  RELEVANT LABS AND IMAGING: CBC    Latest Ref Rng & Units 05/30/2022    3:47 AM 05/29/2022    4:21 AM 05/28/2022    8:24 AM  CBC  WBC 4.0 - 10.5 K/uL 6.6  7.1  8.6   Hemoglobin 12.0 - 15.0 g/dL 9.5  9.3  89.8   Hematocrit 36.0 - 46.0 % 29.8  29.6  32.3   Platelets 150 - 400 K/uL 131  117  126      CMP     Latest Ref Rng & Units 05/31/2022    4:19 AM 05/30/2022    3:47 AM 05/29/2022    4:21 AM  CMP  Glucose 70 - 99 mg/dL 822  843  94   BUN 8 - 23 mg/dL 14  15  17    Creatinine 0.44 - 1.00 mg/dL 8.93  8.78  8.69   Sodium 135 - 145 mmol/L 134  138  134   Potassium 3.5 - 5.1 mmol/L 3.3  3.7  3.7   Chloride 98 - 111 mmol/L 100  104  103   CO2 22 - 32 mmol/L 24  24  23    Calcium  8.9 - 10.3 mg/dL 8.6  8.7  8.2      Lab Results  Component Value Date   TSH 0.579 02/13/2021   02/15/21 echo- Left ventricular ejection fraction, by estimation, is 50 to 55%.   Assessment: Encounter Diagnoses  Name Primary?   Gastroesophageal reflux disease, unspecified whether esophagitis present Yes   Esophageal dysphagia    Chronic constipation         83 year old female patient who presents with esophageal dysphagia.   Patient has history of GERD and rehab facility oversees her medications which includes pantoprazole  40mg   p.o. daily.  Unfortunately due to patient's age, co morbidities, and overall health she would be considered high risk for any endoscopic procedures and would need to be done at the hospital.  We discussed increasing her PPI therapy to twice daily for a few months as well as ordering barium esophagram to evaluate for stricture or web. At that time we can further discuss how we would like to proceed pending results.     Patient also has chronic constipation that she reports  is well-managed with over-the-counter MiraLAX as needed.  Plan: -Increase to omeprazole 40 mg po daily to 40mg  twice daily  short trial  -Recommend strict GERD diet, no late meals  -Barium Esophagram  -Continue Miralax po daily as needed  Thank you for the courtesy of this consult. Please call me with any questions or concerns.   Jayd Forrey, FNP-C [facility] 07/14/2023, 2:05 PM  Cc: Danner, James, DO

## 2023-07-15 DIAGNOSIS — B379 Candidiasis, unspecified: Secondary | ICD-10-CM | POA: Diagnosis not present

## 2023-07-15 DIAGNOSIS — R52 Pain, unspecified: Secondary | ICD-10-CM | POA: Diagnosis not present

## 2023-07-15 DIAGNOSIS — R262 Difficulty in walking, not elsewhere classified: Secondary | ICD-10-CM | POA: Diagnosis not present

## 2023-07-15 DIAGNOSIS — R609 Edema, unspecified: Secondary | ICD-10-CM | POA: Diagnosis not present

## 2023-07-15 DIAGNOSIS — K219 Gastro-esophageal reflux disease without esophagitis: Secondary | ICD-10-CM | POA: Diagnosis not present

## 2023-07-15 DIAGNOSIS — D333 Benign neoplasm of cranial nerves: Secondary | ICD-10-CM | POA: Diagnosis not present

## 2023-07-15 DIAGNOSIS — R1312 Dysphagia, oropharyngeal phase: Secondary | ICD-10-CM | POA: Diagnosis not present

## 2023-07-15 DIAGNOSIS — R2689 Other abnormalities of gait and mobility: Secondary | ICD-10-CM | POA: Diagnosis not present

## 2023-07-15 DIAGNOSIS — S42121S Displaced fracture of acromial process, right shoulder, sequela: Secondary | ICD-10-CM | POA: Diagnosis not present

## 2023-07-17 DIAGNOSIS — R52 Pain, unspecified: Secondary | ICD-10-CM | POA: Diagnosis not present

## 2023-07-17 DIAGNOSIS — R2689 Other abnormalities of gait and mobility: Secondary | ICD-10-CM | POA: Diagnosis not present

## 2023-07-17 DIAGNOSIS — D333 Benign neoplasm of cranial nerves: Secondary | ICD-10-CM | POA: Diagnosis not present

## 2023-07-17 DIAGNOSIS — R262 Difficulty in walking, not elsewhere classified: Secondary | ICD-10-CM | POA: Diagnosis not present

## 2023-07-17 DIAGNOSIS — S42121S Displaced fracture of acromial process, right shoulder, sequela: Secondary | ICD-10-CM | POA: Diagnosis not present

## 2023-07-17 DIAGNOSIS — R1312 Dysphagia, oropharyngeal phase: Secondary | ICD-10-CM | POA: Diagnosis not present

## 2023-07-17 DIAGNOSIS — B379 Candidiasis, unspecified: Secondary | ICD-10-CM | POA: Diagnosis not present

## 2023-07-17 DIAGNOSIS — R609 Edema, unspecified: Secondary | ICD-10-CM | POA: Diagnosis not present

## 2023-07-17 DIAGNOSIS — K219 Gastro-esophageal reflux disease without esophagitis: Secondary | ICD-10-CM | POA: Diagnosis not present

## 2023-07-18 DIAGNOSIS — D333 Benign neoplasm of cranial nerves: Secondary | ICD-10-CM | POA: Diagnosis not present

## 2023-07-18 DIAGNOSIS — K219 Gastro-esophageal reflux disease without esophagitis: Secondary | ICD-10-CM | POA: Diagnosis not present

## 2023-07-18 DIAGNOSIS — B379 Candidiasis, unspecified: Secondary | ICD-10-CM | POA: Diagnosis not present

## 2023-07-18 DIAGNOSIS — I10 Essential (primary) hypertension: Secondary | ICD-10-CM | POA: Diagnosis not present

## 2023-07-18 DIAGNOSIS — S42121S Displaced fracture of acromial process, right shoulder, sequela: Secondary | ICD-10-CM | POA: Diagnosis not present

## 2023-07-18 DIAGNOSIS — R52 Pain, unspecified: Secondary | ICD-10-CM | POA: Diagnosis not present

## 2023-07-18 DIAGNOSIS — R1312 Dysphagia, oropharyngeal phase: Secondary | ICD-10-CM | POA: Diagnosis not present

## 2023-07-18 DIAGNOSIS — D649 Anemia, unspecified: Secondary | ICD-10-CM | POA: Diagnosis not present

## 2023-07-18 DIAGNOSIS — R2689 Other abnormalities of gait and mobility: Secondary | ICD-10-CM | POA: Diagnosis not present

## 2023-07-18 DIAGNOSIS — R609 Edema, unspecified: Secondary | ICD-10-CM | POA: Diagnosis not present

## 2023-07-18 DIAGNOSIS — R262 Difficulty in walking, not elsewhere classified: Secondary | ICD-10-CM | POA: Diagnosis not present

## 2023-07-20 DIAGNOSIS — R2689 Other abnormalities of gait and mobility: Secondary | ICD-10-CM | POA: Diagnosis not present

## 2023-07-20 DIAGNOSIS — R609 Edema, unspecified: Secondary | ICD-10-CM | POA: Diagnosis not present

## 2023-07-20 DIAGNOSIS — S42121S Displaced fracture of acromial process, right shoulder, sequela: Secondary | ICD-10-CM | POA: Diagnosis not present

## 2023-07-20 DIAGNOSIS — R1312 Dysphagia, oropharyngeal phase: Secondary | ICD-10-CM | POA: Diagnosis not present

## 2023-07-20 DIAGNOSIS — B379 Candidiasis, unspecified: Secondary | ICD-10-CM | POA: Diagnosis not present

## 2023-07-20 DIAGNOSIS — D333 Benign neoplasm of cranial nerves: Secondary | ICD-10-CM | POA: Diagnosis not present

## 2023-07-20 DIAGNOSIS — R52 Pain, unspecified: Secondary | ICD-10-CM | POA: Diagnosis not present

## 2023-07-20 DIAGNOSIS — R262 Difficulty in walking, not elsewhere classified: Secondary | ICD-10-CM | POA: Diagnosis not present

## 2023-07-20 DIAGNOSIS — K219 Gastro-esophageal reflux disease without esophagitis: Secondary | ICD-10-CM | POA: Diagnosis not present

## 2023-07-22 DIAGNOSIS — R600 Localized edema: Secondary | ICD-10-CM | POA: Diagnosis not present

## 2023-07-22 DIAGNOSIS — R0602 Shortness of breath: Secondary | ICD-10-CM | POA: Diagnosis not present

## 2023-07-23 ENCOUNTER — Other Ambulatory Visit: Payer: 59

## 2023-07-23 DIAGNOSIS — R609 Edema, unspecified: Secondary | ICD-10-CM | POA: Diagnosis not present

## 2023-07-23 DIAGNOSIS — M62838 Other muscle spasm: Secondary | ICD-10-CM | POA: Diagnosis not present

## 2023-07-23 DIAGNOSIS — J9601 Acute respiratory failure with hypoxia: Secondary | ICD-10-CM | POA: Diagnosis not present

## 2023-07-23 DIAGNOSIS — R1312 Dysphagia, oropharyngeal phase: Secondary | ICD-10-CM | POA: Diagnosis not present

## 2023-07-23 DIAGNOSIS — D333 Benign neoplasm of cranial nerves: Secondary | ICD-10-CM | POA: Diagnosis not present

## 2023-07-23 DIAGNOSIS — R262 Difficulty in walking, not elsewhere classified: Secondary | ICD-10-CM | POA: Diagnosis not present

## 2023-07-23 DIAGNOSIS — B379 Candidiasis, unspecified: Secondary | ICD-10-CM | POA: Diagnosis not present

## 2023-07-23 DIAGNOSIS — K219 Gastro-esophageal reflux disease without esophagitis: Secondary | ICD-10-CM | POA: Diagnosis not present

## 2023-07-23 DIAGNOSIS — S42121S Displaced fracture of acromial process, right shoulder, sequela: Secondary | ICD-10-CM | POA: Diagnosis not present

## 2023-07-23 DIAGNOSIS — R0902 Hypoxemia: Secondary | ICD-10-CM | POA: Diagnosis not present

## 2023-07-23 DIAGNOSIS — R52 Pain, unspecified: Secondary | ICD-10-CM | POA: Diagnosis not present

## 2023-07-23 DIAGNOSIS — R2689 Other abnormalities of gait and mobility: Secondary | ICD-10-CM | POA: Diagnosis not present

## 2023-07-24 DIAGNOSIS — S42121S Displaced fracture of acromial process, right shoulder, sequela: Secondary | ICD-10-CM | POA: Diagnosis not present

## 2023-07-24 DIAGNOSIS — B379 Candidiasis, unspecified: Secondary | ICD-10-CM | POA: Diagnosis not present

## 2023-07-24 DIAGNOSIS — R1312 Dysphagia, oropharyngeal phase: Secondary | ICD-10-CM | POA: Diagnosis not present

## 2023-07-24 DIAGNOSIS — R2689 Other abnormalities of gait and mobility: Secondary | ICD-10-CM | POA: Diagnosis not present

## 2023-07-24 DIAGNOSIS — K219 Gastro-esophageal reflux disease without esophagitis: Secondary | ICD-10-CM | POA: Diagnosis not present

## 2023-07-24 DIAGNOSIS — R52 Pain, unspecified: Secondary | ICD-10-CM | POA: Diagnosis not present

## 2023-07-24 DIAGNOSIS — R262 Difficulty in walking, not elsewhere classified: Secondary | ICD-10-CM | POA: Diagnosis not present

## 2023-07-24 DIAGNOSIS — D333 Benign neoplasm of cranial nerves: Secondary | ICD-10-CM | POA: Diagnosis not present

## 2023-07-24 DIAGNOSIS — R609 Edema, unspecified: Secondary | ICD-10-CM | POA: Diagnosis not present

## 2023-07-25 DIAGNOSIS — R2689 Other abnormalities of gait and mobility: Secondary | ICD-10-CM | POA: Diagnosis not present

## 2023-07-25 DIAGNOSIS — B379 Candidiasis, unspecified: Secondary | ICD-10-CM | POA: Diagnosis not present

## 2023-07-25 DIAGNOSIS — R52 Pain, unspecified: Secondary | ICD-10-CM | POA: Diagnosis not present

## 2023-07-25 DIAGNOSIS — S42121S Displaced fracture of acromial process, right shoulder, sequela: Secondary | ICD-10-CM | POA: Diagnosis not present

## 2023-07-25 DIAGNOSIS — K219 Gastro-esophageal reflux disease without esophagitis: Secondary | ICD-10-CM | POA: Diagnosis not present

## 2023-07-25 DIAGNOSIS — R262 Difficulty in walking, not elsewhere classified: Secondary | ICD-10-CM | POA: Diagnosis not present

## 2023-07-25 DIAGNOSIS — R1312 Dysphagia, oropharyngeal phase: Secondary | ICD-10-CM | POA: Diagnosis not present

## 2023-07-25 DIAGNOSIS — D333 Benign neoplasm of cranial nerves: Secondary | ICD-10-CM | POA: Diagnosis not present

## 2023-07-25 DIAGNOSIS — R609 Edema, unspecified: Secondary | ICD-10-CM | POA: Diagnosis not present

## 2023-07-26 DIAGNOSIS — I2510 Atherosclerotic heart disease of native coronary artery without angina pectoris: Secondary | ICD-10-CM | POA: Diagnosis not present

## 2023-07-26 DIAGNOSIS — E1151 Type 2 diabetes mellitus with diabetic peripheral angiopathy without gangrene: Secondary | ICD-10-CM | POA: Diagnosis not present

## 2023-07-26 DIAGNOSIS — E875 Hyperkalemia: Secondary | ICD-10-CM | POA: Diagnosis not present

## 2023-07-26 DIAGNOSIS — I5032 Chronic diastolic (congestive) heart failure: Secondary | ICD-10-CM | POA: Diagnosis not present

## 2023-07-26 DIAGNOSIS — N182 Chronic kidney disease, stage 2 (mild): Secondary | ICD-10-CM | POA: Diagnosis not present

## 2023-07-26 DIAGNOSIS — E785 Hyperlipidemia, unspecified: Secondary | ICD-10-CM | POA: Diagnosis not present

## 2023-07-26 DIAGNOSIS — D333 Benign neoplasm of cranial nerves: Secondary | ICD-10-CM | POA: Diagnosis not present

## 2023-07-26 DIAGNOSIS — E1140 Type 2 diabetes mellitus with diabetic neuropathy, unspecified: Secondary | ICD-10-CM | POA: Diagnosis not present

## 2023-07-26 DIAGNOSIS — R1314 Dysphagia, pharyngoesophageal phase: Secondary | ICD-10-CM | POA: Diagnosis not present

## 2023-07-26 DIAGNOSIS — L0100 Impetigo, unspecified: Secondary | ICD-10-CM | POA: Diagnosis not present

## 2023-07-29 ENCOUNTER — Ambulatory Visit (HOSPITAL_COMMUNITY)
Admission: RE | Admit: 2023-07-29 | Discharge: 2023-07-29 | Disposition: A | Discharge status: Home / Self Care | Source: Ambulatory Visit | Attending: Gastroenterology | Admitting: Gastroenterology

## 2023-07-29 ENCOUNTER — Ambulatory Visit: Payer: Self-pay | Admitting: Gastroenterology

## 2023-07-29 DIAGNOSIS — R130 Aphagia: Secondary | ICD-10-CM | POA: Diagnosis not present

## 2023-07-29 DIAGNOSIS — R1319 Other dysphagia: Secondary | ICD-10-CM | POA: Insufficient documentation

## 2023-07-29 DIAGNOSIS — E119 Type 2 diabetes mellitus without complications: Secondary | ICD-10-CM | POA: Diagnosis not present

## 2023-07-29 DIAGNOSIS — K219 Gastro-esophageal reflux disease without esophagitis: Secondary | ICD-10-CM | POA: Insufficient documentation

## 2023-07-29 DIAGNOSIS — K224 Dyskinesia of esophagus: Secondary | ICD-10-CM | POA: Diagnosis not present

## 2023-07-29 DIAGNOSIS — K5909 Other constipation: Secondary | ICD-10-CM | POA: Insufficient documentation

## 2023-07-29 DIAGNOSIS — R1310 Dysphagia, unspecified: Secondary | ICD-10-CM | POA: Diagnosis not present

## 2023-07-30 ENCOUNTER — Telehealth: Payer: Self-pay | Admitting: Gastroenterology

## 2023-07-30 DIAGNOSIS — K224 Dyskinesia of esophagus: Secondary | ICD-10-CM

## 2023-07-30 MED ORDER — HYOSCYAMINE SULFATE 0.125 MG SL SUBL: 0.1250 mg | SUBLINGUAL_TABLET | Freq: Three times a day (TID) | SUBLINGUAL | 2 refills | Status: DC | PRN

## 2023-07-30 NOTE — Telephone Encounter (Signed)
 Spoke with patient about Dr. Leonardo recommendations and results of swallow study.  She does not wish to pursue EGD or being put under. She reports she has done speech in past and it helped some. Would prefer to try new medication and revisit speech if no improvement. We discussed possible SE of hyoscyamine , she will take 1 tab before meals three times daily.

## 2023-07-30 NOTE — Telephone Encounter (Signed)
 Attempted to reach patient to give her recommendations from Dr. Charlanne.  No answer left voicemail.

## 2023-07-30 NOTE — Telephone Encounter (Signed)
 Patient is calling back to Speak to Deanna may on Dr. Ira recommendation. Patient is requesting a call back. Please advise.

## 2023-07-31 DIAGNOSIS — S42121S Displaced fracture of acromial process, right shoulder, sequela: Secondary | ICD-10-CM | POA: Diagnosis not present

## 2023-07-31 DIAGNOSIS — B379 Candidiasis, unspecified: Secondary | ICD-10-CM | POA: Diagnosis not present

## 2023-07-31 DIAGNOSIS — K219 Gastro-esophageal reflux disease without esophagitis: Secondary | ICD-10-CM | POA: Diagnosis not present

## 2023-07-31 DIAGNOSIS — R52 Pain, unspecified: Secondary | ICD-10-CM | POA: Diagnosis not present

## 2023-07-31 DIAGNOSIS — D333 Benign neoplasm of cranial nerves: Secondary | ICD-10-CM | POA: Diagnosis not present

## 2023-07-31 DIAGNOSIS — R1312 Dysphagia, oropharyngeal phase: Secondary | ICD-10-CM | POA: Diagnosis not present

## 2023-07-31 DIAGNOSIS — R609 Edema, unspecified: Secondary | ICD-10-CM | POA: Diagnosis not present

## 2023-07-31 DIAGNOSIS — R262 Difficulty in walking, not elsewhere classified: Secondary | ICD-10-CM | POA: Diagnosis not present

## 2023-07-31 DIAGNOSIS — R2689 Other abnormalities of gait and mobility: Secondary | ICD-10-CM | POA: Diagnosis not present

## 2023-07-31 NOTE — Telephone Encounter (Signed)
 Copy of OV notes with plan & medication sent in yesterday faxed to number provided.

## 2023-07-31 NOTE — Telephone Encounter (Signed)
 Inbound call from [name] from [facility] stating patient doesn't understand what she needs to do if we can fax office notes to them.  Fax number [phone]  Please advise  Thank you

## 2023-08-01 DIAGNOSIS — R609 Edema, unspecified: Secondary | ICD-10-CM | POA: Diagnosis not present

## 2023-08-01 DIAGNOSIS — S42121S Displaced fracture of acromial process, right shoulder, sequela: Secondary | ICD-10-CM | POA: Diagnosis not present

## 2023-08-01 DIAGNOSIS — B379 Candidiasis, unspecified: Secondary | ICD-10-CM | POA: Diagnosis not present

## 2023-08-01 DIAGNOSIS — R1312 Dysphagia, oropharyngeal phase: Secondary | ICD-10-CM | POA: Diagnosis not present

## 2023-08-01 DIAGNOSIS — R262 Difficulty in walking, not elsewhere classified: Secondary | ICD-10-CM | POA: Diagnosis not present

## 2023-08-01 DIAGNOSIS — R52 Pain, unspecified: Secondary | ICD-10-CM | POA: Diagnosis not present

## 2023-08-01 DIAGNOSIS — M62838 Other muscle spasm: Secondary | ICD-10-CM | POA: Diagnosis not present

## 2023-08-01 DIAGNOSIS — K219 Gastro-esophageal reflux disease without esophagitis: Secondary | ICD-10-CM | POA: Diagnosis not present

## 2023-08-01 DIAGNOSIS — D333 Benign neoplasm of cranial nerves: Secondary | ICD-10-CM | POA: Diagnosis not present

## 2023-08-01 DIAGNOSIS — R2689 Other abnormalities of gait and mobility: Secondary | ICD-10-CM | POA: Diagnosis not present

## 2023-08-04 ENCOUNTER — Other Ambulatory Visit (HOSPITAL_COMMUNITY)

## 2023-08-04 DIAGNOSIS — L821 Other seborrheic keratosis: Secondary | ICD-10-CM | POA: Diagnosis not present

## 2023-08-04 DIAGNOSIS — L814 Other melanin hyperpigmentation: Secondary | ICD-10-CM | POA: Diagnosis not present

## 2023-08-04 DIAGNOSIS — R262 Difficulty in walking, not elsewhere classified: Secondary | ICD-10-CM | POA: Diagnosis not present

## 2023-08-04 DIAGNOSIS — R2689 Other abnormalities of gait and mobility: Secondary | ICD-10-CM | POA: Diagnosis not present

## 2023-08-04 DIAGNOSIS — D333 Benign neoplasm of cranial nerves: Secondary | ICD-10-CM | POA: Diagnosis not present

## 2023-08-04 DIAGNOSIS — R609 Edema, unspecified: Secondary | ICD-10-CM | POA: Diagnosis not present

## 2023-08-04 DIAGNOSIS — L2989 Other pruritus: Secondary | ICD-10-CM | POA: Diagnosis not present

## 2023-08-04 DIAGNOSIS — R52 Pain, unspecified: Secondary | ICD-10-CM | POA: Diagnosis not present

## 2023-08-04 DIAGNOSIS — D225 Melanocytic nevi of trunk: Secondary | ICD-10-CM | POA: Diagnosis not present

## 2023-08-04 DIAGNOSIS — R1312 Dysphagia, oropharyngeal phase: Secondary | ICD-10-CM | POA: Diagnosis not present

## 2023-08-04 DIAGNOSIS — S42121S Displaced fracture of acromial process, right shoulder, sequela: Secondary | ICD-10-CM | POA: Diagnosis not present

## 2023-08-04 DIAGNOSIS — B379 Candidiasis, unspecified: Secondary | ICD-10-CM | POA: Diagnosis not present

## 2023-08-04 DIAGNOSIS — K219 Gastro-esophageal reflux disease without esophagitis: Secondary | ICD-10-CM | POA: Diagnosis not present

## 2023-08-05 DIAGNOSIS — R52 Pain, unspecified: Secondary | ICD-10-CM | POA: Diagnosis not present

## 2023-08-05 DIAGNOSIS — R1312 Dysphagia, oropharyngeal phase: Secondary | ICD-10-CM | POA: Diagnosis not present

## 2023-08-05 DIAGNOSIS — D333 Benign neoplasm of cranial nerves: Secondary | ICD-10-CM | POA: Diagnosis not present

## 2023-08-05 DIAGNOSIS — K219 Gastro-esophageal reflux disease without esophagitis: Secondary | ICD-10-CM | POA: Diagnosis not present

## 2023-08-05 DIAGNOSIS — R2689 Other abnormalities of gait and mobility: Secondary | ICD-10-CM | POA: Diagnosis not present

## 2023-08-05 DIAGNOSIS — R609 Edema, unspecified: Secondary | ICD-10-CM | POA: Diagnosis not present

## 2023-08-05 DIAGNOSIS — B379 Candidiasis, unspecified: Secondary | ICD-10-CM | POA: Diagnosis not present

## 2023-08-05 DIAGNOSIS — R262 Difficulty in walking, not elsewhere classified: Secondary | ICD-10-CM | POA: Diagnosis not present

## 2023-08-05 DIAGNOSIS — S42121S Displaced fracture of acromial process, right shoulder, sequela: Secondary | ICD-10-CM | POA: Diagnosis not present

## 2023-08-12 DIAGNOSIS — S42121S Displaced fracture of acromial process, right shoulder, sequela: Secondary | ICD-10-CM | POA: Diagnosis not present

## 2023-08-12 DIAGNOSIS — D333 Benign neoplasm of cranial nerves: Secondary | ICD-10-CM | POA: Diagnosis not present

## 2023-08-12 DIAGNOSIS — K219 Gastro-esophageal reflux disease without esophagitis: Secondary | ICD-10-CM | POA: Diagnosis not present

## 2023-08-12 DIAGNOSIS — R52 Pain, unspecified: Secondary | ICD-10-CM | POA: Diagnosis not present

## 2023-08-12 DIAGNOSIS — R609 Edema, unspecified: Secondary | ICD-10-CM | POA: Diagnosis not present

## 2023-08-12 DIAGNOSIS — R262 Difficulty in walking, not elsewhere classified: Secondary | ICD-10-CM | POA: Diagnosis not present

## 2023-08-12 DIAGNOSIS — R2689 Other abnormalities of gait and mobility: Secondary | ICD-10-CM | POA: Diagnosis not present

## 2023-08-12 DIAGNOSIS — R1312 Dysphagia, oropharyngeal phase: Secondary | ICD-10-CM | POA: Diagnosis not present

## 2023-08-12 DIAGNOSIS — B379 Candidiasis, unspecified: Secondary | ICD-10-CM | POA: Diagnosis not present

## 2023-08-16 DIAGNOSIS — I2510 Atherosclerotic heart disease of native coronary artery without angina pectoris: Secondary | ICD-10-CM | POA: Diagnosis not present

## 2023-08-16 DIAGNOSIS — E1151 Type 2 diabetes mellitus with diabetic peripheral angiopathy without gangrene: Secondary | ICD-10-CM | POA: Diagnosis not present

## 2023-08-16 DIAGNOSIS — N182 Chronic kidney disease, stage 2 (mild): Secondary | ICD-10-CM | POA: Diagnosis not present

## 2023-08-16 DIAGNOSIS — I5032 Chronic diastolic (congestive) heart failure: Secondary | ICD-10-CM | POA: Diagnosis not present

## 2023-08-16 DIAGNOSIS — R1314 Dysphagia, pharyngoesophageal phase: Secondary | ICD-10-CM | POA: Diagnosis not present

## 2023-08-16 DIAGNOSIS — D333 Benign neoplasm of cranial nerves: Secondary | ICD-10-CM | POA: Diagnosis not present

## 2023-08-16 DIAGNOSIS — E875 Hyperkalemia: Secondary | ICD-10-CM | POA: Diagnosis not present

## 2023-08-16 DIAGNOSIS — E1140 Type 2 diabetes mellitus with diabetic neuropathy, unspecified: Secondary | ICD-10-CM | POA: Diagnosis not present

## 2023-08-16 DIAGNOSIS — E785 Hyperlipidemia, unspecified: Secondary | ICD-10-CM | POA: Diagnosis not present

## 2023-08-16 DIAGNOSIS — L0100 Impetigo, unspecified: Secondary | ICD-10-CM | POA: Diagnosis not present

## 2023-08-18 DIAGNOSIS — D333 Benign neoplasm of cranial nerves: Secondary | ICD-10-CM | POA: Diagnosis not present

## 2023-08-18 DIAGNOSIS — E785 Hyperlipidemia, unspecified: Secondary | ICD-10-CM | POA: Diagnosis not present

## 2023-08-18 DIAGNOSIS — E1151 Type 2 diabetes mellitus with diabetic peripheral angiopathy without gangrene: Secondary | ICD-10-CM | POA: Diagnosis not present

## 2023-08-18 DIAGNOSIS — I10 Essential (primary) hypertension: Secondary | ICD-10-CM | POA: Diagnosis not present

## 2023-08-18 DIAGNOSIS — R1314 Dysphagia, pharyngoesophageal phase: Secondary | ICD-10-CM | POA: Diagnosis not present

## 2023-08-18 DIAGNOSIS — I2510 Atherosclerotic heart disease of native coronary artery without angina pectoris: Secondary | ICD-10-CM | POA: Diagnosis not present

## 2023-08-18 DIAGNOSIS — I5032 Chronic diastolic (congestive) heart failure: Secondary | ICD-10-CM | POA: Diagnosis not present

## 2023-08-18 DIAGNOSIS — L0100 Impetigo, unspecified: Secondary | ICD-10-CM | POA: Diagnosis not present

## 2023-08-18 DIAGNOSIS — E875 Hyperkalemia: Secondary | ICD-10-CM | POA: Diagnosis not present

## 2023-08-18 DIAGNOSIS — E1140 Type 2 diabetes mellitus with diabetic neuropathy, unspecified: Secondary | ICD-10-CM | POA: Diagnosis not present

## 2023-08-18 DIAGNOSIS — N182 Chronic kidney disease, stage 2 (mild): Secondary | ICD-10-CM | POA: Diagnosis not present

## 2023-08-20 DIAGNOSIS — E1151 Type 2 diabetes mellitus with diabetic peripheral angiopathy without gangrene: Secondary | ICD-10-CM | POA: Diagnosis not present

## 2023-08-20 DIAGNOSIS — I2510 Atherosclerotic heart disease of native coronary artery without angina pectoris: Secondary | ICD-10-CM | POA: Diagnosis not present

## 2023-08-20 DIAGNOSIS — E785 Hyperlipidemia, unspecified: Secondary | ICD-10-CM | POA: Diagnosis not present

## 2023-08-20 DIAGNOSIS — R1314 Dysphagia, pharyngoesophageal phase: Secondary | ICD-10-CM | POA: Diagnosis not present

## 2023-08-20 DIAGNOSIS — L0100 Impetigo, unspecified: Secondary | ICD-10-CM | POA: Diagnosis not present

## 2023-08-20 DIAGNOSIS — E1140 Type 2 diabetes mellitus with diabetic neuropathy, unspecified: Secondary | ICD-10-CM | POA: Diagnosis not present

## 2023-08-20 DIAGNOSIS — I5032 Chronic diastolic (congestive) heart failure: Secondary | ICD-10-CM | POA: Diagnosis not present

## 2023-08-20 DIAGNOSIS — E875 Hyperkalemia: Secondary | ICD-10-CM | POA: Diagnosis not present

## 2023-08-20 DIAGNOSIS — D333 Benign neoplasm of cranial nerves: Secondary | ICD-10-CM | POA: Diagnosis not present

## 2023-08-20 DIAGNOSIS — N182 Chronic kidney disease, stage 2 (mild): Secondary | ICD-10-CM | POA: Diagnosis not present

## 2023-08-21 DIAGNOSIS — D333 Benign neoplasm of cranial nerves: Secondary | ICD-10-CM | POA: Diagnosis not present

## 2023-08-21 DIAGNOSIS — E1140 Type 2 diabetes mellitus with diabetic neuropathy, unspecified: Secondary | ICD-10-CM | POA: Diagnosis not present

## 2023-08-21 DIAGNOSIS — L0100 Impetigo, unspecified: Secondary | ICD-10-CM | POA: Diagnosis not present

## 2023-08-21 DIAGNOSIS — R1314 Dysphagia, pharyngoesophageal phase: Secondary | ICD-10-CM | POA: Diagnosis not present

## 2023-08-21 DIAGNOSIS — I5032 Chronic diastolic (congestive) heart failure: Secondary | ICD-10-CM | POA: Diagnosis not present

## 2023-08-21 DIAGNOSIS — E785 Hyperlipidemia, unspecified: Secondary | ICD-10-CM | POA: Diagnosis not present

## 2023-08-21 DIAGNOSIS — E875 Hyperkalemia: Secondary | ICD-10-CM | POA: Diagnosis not present

## 2023-08-21 DIAGNOSIS — N182 Chronic kidney disease, stage 2 (mild): Secondary | ICD-10-CM | POA: Diagnosis not present

## 2023-08-21 DIAGNOSIS — I2510 Atherosclerotic heart disease of native coronary artery without angina pectoris: Secondary | ICD-10-CM | POA: Diagnosis not present

## 2023-08-21 DIAGNOSIS — E1151 Type 2 diabetes mellitus with diabetic peripheral angiopathy without gangrene: Secondary | ICD-10-CM | POA: Diagnosis not present

## 2023-08-22 DIAGNOSIS — R7309 Other abnormal glucose: Secondary | ICD-10-CM | POA: Diagnosis not present

## 2023-08-22 DIAGNOSIS — Z13220 Encounter for screening for lipoid disorders: Secondary | ICD-10-CM | POA: Diagnosis not present

## 2023-08-22 DIAGNOSIS — E7800 Pure hypercholesterolemia, unspecified: Secondary | ICD-10-CM | POA: Diagnosis not present

## 2023-08-24 DIAGNOSIS — E1140 Type 2 diabetes mellitus with diabetic neuropathy, unspecified: Secondary | ICD-10-CM | POA: Diagnosis not present

## 2023-08-24 DIAGNOSIS — E1151 Type 2 diabetes mellitus with diabetic peripheral angiopathy without gangrene: Secondary | ICD-10-CM | POA: Diagnosis not present

## 2023-08-24 DIAGNOSIS — E785 Hyperlipidemia, unspecified: Secondary | ICD-10-CM | POA: Diagnosis not present

## 2023-08-24 DIAGNOSIS — L0100 Impetigo, unspecified: Secondary | ICD-10-CM | POA: Diagnosis not present

## 2023-08-24 DIAGNOSIS — R1314 Dysphagia, pharyngoesophageal phase: Secondary | ICD-10-CM | POA: Diagnosis not present

## 2023-08-24 DIAGNOSIS — N182 Chronic kidney disease, stage 2 (mild): Secondary | ICD-10-CM | POA: Diagnosis not present

## 2023-08-24 DIAGNOSIS — D333 Benign neoplasm of cranial nerves: Secondary | ICD-10-CM | POA: Diagnosis not present

## 2023-08-24 DIAGNOSIS — I2510 Atherosclerotic heart disease of native coronary artery without angina pectoris: Secondary | ICD-10-CM | POA: Diagnosis not present

## 2023-08-24 DIAGNOSIS — E875 Hyperkalemia: Secondary | ICD-10-CM | POA: Diagnosis not present

## 2023-08-24 DIAGNOSIS — I5032 Chronic diastolic (congestive) heart failure: Secondary | ICD-10-CM | POA: Diagnosis not present

## 2023-08-25 ENCOUNTER — Ambulatory Visit: Admitting: Dermatology

## 2023-08-25 DIAGNOSIS — D333 Benign neoplasm of cranial nerves: Secondary | ICD-10-CM | POA: Diagnosis not present

## 2023-08-25 DIAGNOSIS — N182 Chronic kidney disease, stage 2 (mild): Secondary | ICD-10-CM | POA: Diagnosis not present

## 2023-08-25 DIAGNOSIS — R1314 Dysphagia, pharyngoesophageal phase: Secondary | ICD-10-CM | POA: Diagnosis not present

## 2023-08-25 DIAGNOSIS — E1151 Type 2 diabetes mellitus with diabetic peripheral angiopathy without gangrene: Secondary | ICD-10-CM | POA: Diagnosis not present

## 2023-08-25 DIAGNOSIS — L0100 Impetigo, unspecified: Secondary | ICD-10-CM | POA: Diagnosis not present

## 2023-08-25 DIAGNOSIS — E875 Hyperkalemia: Secondary | ICD-10-CM | POA: Diagnosis not present

## 2023-08-25 DIAGNOSIS — E785 Hyperlipidemia, unspecified: Secondary | ICD-10-CM | POA: Diagnosis not present

## 2023-08-25 DIAGNOSIS — I2510 Atherosclerotic heart disease of native coronary artery without angina pectoris: Secondary | ICD-10-CM | POA: Diagnosis not present

## 2023-08-25 DIAGNOSIS — I5032 Chronic diastolic (congestive) heart failure: Secondary | ICD-10-CM | POA: Diagnosis not present

## 2023-08-25 DIAGNOSIS — E1140 Type 2 diabetes mellitus with diabetic neuropathy, unspecified: Secondary | ICD-10-CM | POA: Diagnosis not present

## 2023-08-26 DIAGNOSIS — N182 Chronic kidney disease, stage 2 (mild): Secondary | ICD-10-CM | POA: Diagnosis not present

## 2023-08-26 DIAGNOSIS — I5032 Chronic diastolic (congestive) heart failure: Secondary | ICD-10-CM | POA: Diagnosis not present

## 2023-08-26 DIAGNOSIS — E1140 Type 2 diabetes mellitus with diabetic neuropathy, unspecified: Secondary | ICD-10-CM | POA: Diagnosis not present

## 2023-08-26 DIAGNOSIS — L0100 Impetigo, unspecified: Secondary | ICD-10-CM | POA: Diagnosis not present

## 2023-08-26 DIAGNOSIS — E875 Hyperkalemia: Secondary | ICD-10-CM | POA: Diagnosis not present

## 2023-08-26 DIAGNOSIS — I2510 Atherosclerotic heart disease of native coronary artery without angina pectoris: Secondary | ICD-10-CM | POA: Diagnosis not present

## 2023-08-26 DIAGNOSIS — D333 Benign neoplasm of cranial nerves: Secondary | ICD-10-CM | POA: Diagnosis not present

## 2023-08-26 DIAGNOSIS — N390 Urinary tract infection, site not specified: Secondary | ICD-10-CM | POA: Diagnosis not present

## 2023-08-26 DIAGNOSIS — E785 Hyperlipidemia, unspecified: Secondary | ICD-10-CM | POA: Diagnosis not present

## 2023-08-26 DIAGNOSIS — R1314 Dysphagia, pharyngoesophageal phase: Secondary | ICD-10-CM | POA: Diagnosis not present

## 2023-08-26 DIAGNOSIS — E1151 Type 2 diabetes mellitus with diabetic peripheral angiopathy without gangrene: Secondary | ICD-10-CM | POA: Diagnosis not present

## 2023-08-27 ENCOUNTER — Ambulatory Visit (INDEPENDENT_AMBULATORY_CARE_PROVIDER_SITE_OTHER): Admitting: Podiatry

## 2023-08-27 DIAGNOSIS — E1142 Type 2 diabetes mellitus with diabetic polyneuropathy: Secondary | ICD-10-CM

## 2023-08-27 DIAGNOSIS — M79676 Pain in unspecified toe(s): Secondary | ICD-10-CM

## 2023-08-27 DIAGNOSIS — M2011 Hallux valgus (acquired), right foot: Secondary | ICD-10-CM

## 2023-08-27 DIAGNOSIS — E119 Type 2 diabetes mellitus without complications: Secondary | ICD-10-CM | POA: Diagnosis not present

## 2023-08-27 DIAGNOSIS — B351 Tinea unguium: Secondary | ICD-10-CM | POA: Diagnosis not present

## 2023-08-27 DIAGNOSIS — M2012 Hallux valgus (acquired), left foot: Secondary | ICD-10-CM | POA: Diagnosis not present

## 2023-08-27 NOTE — Progress Notes (Signed)
 ANNUAL DIABETIC FOOT EXAM  Subjective: Andrea Kirk presents today for annual diabetic foot exam. She is a resident of [facility] and [facility]. Chief Complaint  Patient presents with   Diabetes    DFC NIDDM A1C 8.1. LOV with PCP 07/14/23.   Patient confirms h/o diabetes.  Patient denies any h/o foot wounds.  Patient has been diagnosed with neuropathy.  Danner, James, DO is patient's PCP.  Past Medical History:  Diagnosis Date   Acute kidney injury (HCC) 08/05/2016   Aftercare following surgery of the circulatory system, NEC 05/11/2013   AKI (acute kidney injury) (HCC) 08/04/2016   Anxiety    takes Celexa  daily   ARF (acute renal failure) (HCC) 11/07/2016   Asymptomatic stenosis of right carotid artery 03/22/2016   Back pain    occasionally   Carotid artery disease (HCC) 04/16/2013   Carotid artery occlusion    Carotid stenosis 11/16/2013   Cataract    left and immature   Coronary artery disease    Coronary atherosclerosis of native coronary artery 03/11/2013   S/p CABG in 1997    Depression    Diabetes mellitus    takes Metformin  and Glipizide  daily   Diabetes mellitus (HCC) 05/02/2015   Dizziness    takes Meclizine  daily as needed   Essential hypertension, benign 03/11/2013   GERD (gastroesophageal reflux disease)    takes Omeprazole daily as needed   Headache(784.0)    Hyperlipidemia    takes Atorvastatin  daily   Hypertension    takes Carvedilol  daily   Mixed hyperlipidemia 03/11/2013   Muscle spasm    takes Robaxin  daily as needed   Nausea    takes Phenergan  daily as needed   Occlusion and stenosis of carotid artery without mention of cerebral infarction 07/19/2011   Pneumonia    hx of-in high school   Restless leg    takes Requip  daily as needed   Seasonal allergies    takes Claritin  daily as needed and Afrin as needed   Shortness of breath    with exertion   Urinary urgency    UTI (urinary tract infection) 11/07/2016   Patient Active Problem List    Diagnosis Date Noted   Bilateral sensorineural hearing loss 07/25/2022   Sepsis secondary to UTI (HCC) 05/28/2022   Nausea 02/17/2021   C. difficile colitis 02/15/2021   Elevated troponin 02/14/2021   Gram-negative bacteremia 02/14/2021   Severe sepsis (HCC) 02/14/2021   Thrombocytopenia (HCC) 02/13/2021   Acute encephalopathy 02/13/2021   Anxiety and depression 02/13/2021   Closed fracture of acromial process of right scapula 11/21/2020   Syncope, vasovagal 06/23/2020   Pulmonary hypertension (HCC) 04/20/2020   Controlled type 2 diabetes mellitus with hyperglycemia (HCC) 04/20/2020   Essential hypertension 04/20/2020   HLD (hyperlipidemia) 04/20/2020   Acute renal failure superimposed on stage 2 chronic kidney disease (HCC) 04/20/2020   Iron deficiency anemia 04/20/2020   Acute respiratory failure with hypoxia (HCC) 04/19/2020   Obese 04/19/2020   Chronic back pain 04/19/2020   Acute stroke due to ischemia [facility])    hx of CVA (cerebral vascular accident) (HCC) 04/16/2020   Macular retinoschisis, 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   Vitreomacular traction syndrome, 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   Depression 12/22/2017   Chronic pain 12/22/2017   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 1997   ENDARTERECTOMY Left 04/16/2013   Procedure: Left Carotid Artery Endatarectomy with Resection of Redundant Internal Carotid Artery;  Surgeon: Krystal JULIANNA Doing, MD;  Location: [facility] OR;  Service: Vascular;  Laterality: Left;   ENDARTERECTOMY Right 03/22/2016   Procedure: RIGHT ENDARTERECTOMY CAROTID;  Surgeon: Krystal JULIANNA Doing, MD;  Location: [facility] OR;  Service: Vascular;  Laterality: Right;   ESOPHAGOGASTRODUODENOSCOPY N/A 03/10/2012   Procedure: ESOPHAGOGASTRODUODENOSCOPY (EGD);  Surgeon: Gladis MARLA Louder, MD;  Location: [facility];  Service: Endoscopy;  Laterality: N/A;   EYE SURGERY  March 12, 2001   CORNEA TRANSPLANT Right eye   PATCH ANGIOPLASTY Right 03/22/2016   Procedure: PATCH ANGIOPLASTY;  Surgeon: Krystal JULIANNA Doing, MD;  Location: [facility] OR;  Service: Vascular;  Laterality: Right;   PR VEIN BYPASS GRAFT,AORTO-FEM-POP  1997   SPINE SURGERY  march 2013   Back surgery   TONSILLECTOMY     TRIGGER FINGER RELEASE Left    thumb   Current Outpatient Medications on File Prior to Visit  Medication Sig Dispense Refill   acetaminophen  (TYLENOL ) 500 MG tablet Take 1,000 mg by mouth at bedtime.     amLODipine  (NORVASC ) 10 MG tablet Take 10 mg by mouth daily.     aspirin  EC 81 MG tablet Take 81 mg by mouth every evening.      busPIRone  (BUSPAR ) 10 MG tablet Take 10 mg by mouth 2 (two) times daily.     calcium  carbonate (TUMS - DOSED IN MG ELEMENTAL CALCIUM ) 500 MG chewable tablet Chew 1 tablet by mouth 3 (three) times daily with meals.     carvedilol  (COREG ) 12.5 MG tablet Take 1 tablet (12.5 mg total) by mouth 2 (two) times daily. 60 tablet 2   citalopram  (CELEXA ) 20 MG tablet Take 20 mg by mouth daily.     diphenhydrAMINE -zinc acetate (BENADRYL ) cream Apply 1 Application topically daily as needed for itching.     docusate sodium  (COLACE) 100 MG capsule Take 100 mg by mouth 2 (two) times daily.     empagliflozin (JARDIANCE) 25 MG TABS tablet Take 25 mg by mouth daily.     estradiol (ESTRACE) 0.1 MG/GM vaginal cream Place 1 Applicatorful vaginally at bedtime. Twice daily for pain     ferrous sulfate  325 (65 FE) MG tablet Take 325 mg by mouth daily. Monday-Friday      fluticasone (CUTIVATE) 0.005 % ointment Apply 1 Application topically as needed.     furosemide  (LASIX ) 20 MG tablet Take 1 tablet (20 mg) daily for 3 days, then take once daily as needed for weight gain of 2-3 lbs overnight or 5 lbs in 1 week. (Patient taking differently: Take 20 mg by mouth 2 (two) times daily. Take 1 tablet (20 mg) daily for 3 days, then take once daily as needed for weight gain of 2-3 lbs overnight or 5 lbs in 1 week.) 90 tablet 3   gabapentin  (NEURONTIN ) 300 MG capsule Take 300 mg by mouth 3 (three) times daily.     glipiZIDE  (GLUCOTROL  XL) 5 MG 24 hr tablet Take 1 tablet (5 mg total) by mouth daily with breakfast.  30 tablet 0   hydrOXYzine  (ATARAX ) 25 MG tablet Take 25 mg by mouth every 6 (six) hours as needed.     hyoscyamine  (LEVSIN  SL) 0.125 MG SL tablet Place 1 tablet (0.125 mg total) under the tongue 3 (three) times daily with meals as needed. 90 tablet 2   levocetirizine (XYZAL) 5 MG tablet Take 5 mg by mouth every evening.     magnesium  oxide (MAG-OX) 400 (240 Mg) MG tablet Take 400 mg by mouth at bedtime.     meclizine  (ANTIVERT ) 25 MG tablet Take 25 mg by mouth 3 (three) times daily.     melatonin 3 MG TABS tablet Take 3 mg by mouth at bedtime.     methocarbamol  (ROBAXIN ) 750 MG tablet Take 750 mg by mouth 2 (two) times daily.     mupirocin  ointment (BACTROBAN ) 2 % Apply 1 Application topically as needed.     nystatin powder Apply 1 Application topically 2 (two) times daily.     oxybutynin  (DITROPAN -XL) 10 MG 24 hr tablet Take 10 mg by mouth daily.     OXYGEN  Inhale 2 L/min into the lungs continuous.     pantoprazole  (PROTONIX ) 40 MG tablet Take 40 mg by mouth daily.     pantoprazole  (PROTONIX ) 40 MG tablet Take 1 tablet (40 mg total) by mouth 2 (two) times daily. 180 tablet 1   rosuvastatin  (CRESTOR ) 10 MG tablet Take 10 mg by mouth daily.     saccharomyces boulardii (FLORASTOR) 250 MG capsule Take 250 mg by mouth daily.     senna (SENOKOT) 8.6 MG tablet Take 2  tablets by mouth at bedtime.     sodium zirconium cyclosilicate  (LOKELMA ) 10 g PACK packet Take 10 g by mouth daily.     ticagrelor  (BRILINTA ) 90 MG TABS tablet Take 1 tablet (90 mg total) by mouth 2 (two) times daily. 60 tablet 0   valsartan (DIOVAN) 80 MG tablet Take 80 mg by mouth daily.     HYDROcodone -acetaminophen  (NORCO) 10-325 MG tablet Take 1 tablet by mouth in the morning and at bedtime. (Patient not taking: Reported on 08/27/2023)     HYDROcodone -acetaminophen  (NORCO/VICODIN) 5-325 MG tablet Take 1 tablet by mouth daily. At 1pm (Patient not taking: Reported on 08/27/2023)     No current facility-administered medications on file prior to visit.    Allergies  Allergen Reactions   Latex Other (See Comments), Rash and Hives    tears skin   Codeine Other (See Comments)    Abnormal behavior   Morphine  And Codeine Other (See Comments)    Affects BP and blood sugar.   Cyclobenzaprine Other (See Comments)    MADE PT SICK- pt unsure if med was cyclobenzaprine or methocarbamol    Methocarbamol  Other (See Comments)    MADE PT SICK- pt unsure if med was cyclobenzaprine or methocarbamol    11/09/20 pt currently takes methocarbamol   MADE PT SICK- pt unsure if med was cyclobenzaprine or methocarbamol   11/09/20 pt currently takes methocarbamol    Morphine  Other (See Comments)    Unknown reaction   Social History   Occupational History   Not on file  Tobacco Use   Smoking status: Never   Smokeless tobacco: Never  Vaping Use   Vaping status: Never Used  Substance and Sexual Activity   Alcohol  use: No    Alcohol /week: 0.0 standard drinks of alcohol    Drug use: No   Sexual activity: Never    Birth control/protection: Post-menopausal   Family History  Problem Relation Age  of Onset   Heart attack Father    Heart disease Father 6       Before age of 27   Hyperlipidemia Father    Heart disease Brother        Heart dissease before age 94   Hyperlipidemia Brother    Cirrhosis Mother     Heart attack Paternal Grandfather    Heart disease Paternal Grandfather    Cancer Maternal Grandmother    Alcoholism Maternal Grandfather    Heart attack Paternal Grandmother    Immunization History  Administered Date(s) Administered   Fluzone Influenza virus vaccine,trivalent (IIV3), split virus 02/10/2020   Influenza, High Dose Seasonal PF 09/18/2018   Moderna Sars-Covid-2 Vaccination 02/09/2019, 03/08/2019   Pneumococcal Conjugate-13 11/16/2014   Pneumococcal Polysaccharide-23 09/01/2003, 10/26/2008   Td 10/31/2005   Tdap 12/15/2012, 11/09/2020   Zoster, Live 11/20/2012, 12/20/2018, 03/28/2019     Review of Systems: Negative except as noted in the HPI.   Objective: There were no vitals filed for this visit.  Andrea Kirk is a pleasant 83 y.o. female in NAD. AAO X 3.  Diabetic foot exam was performed with the following findings:   Vascular Examination: CFT <3 seconds b/l. DP pulses faintly palpable b/l. PT pulses palpable b/l. Digital hair absent. Skin temperature gradient warm to warm b/l. No pain with calf compression. No ischemia or gangrene. No cyanosis or clubbing noted b/l. Dependent edema noted b/l LE. Trace edema noted BLE. Evidence of chronic venous insufficiency b/l LE.   Neurological Examination: Sensation grossly intact b/l with 10 gram monofilament. Vibratory sensation intact b/l. Pt has subjective symptoms of neuropathy.  Dermatological Examination: Pedal skin warm and supple b/l. No open wounds. No interdigital macerations. Toenails 1-5 b/l thick, discolored, elongated with subungual debris and pain on dorsal palpation. No corns, calluses, nor porokeratotic lesions.  Musculoskeletal Examination: Muscle strength 5/5 to all lower extremity muscle groups bilaterally. No pain, crepitus or joint limitation noted with ROM b/l LE. HAV with bunion deformity noted b/l LE.. Mobility via wheelchair assistance.  Radiographs: None     Lab Results  Component Value  Date   HGBA1C 8.1 (H) 05/28/2022   ADA Risk Categorization: Low Risk :  Patient has all of the following: Intact protective sensation No prior foot ulcer  No severe deformity Pedal pulses present  Assessment: 1. Pain due to onychomycosis of toenail   2. Hallux valgus, acquired, bilateral   3. Diabetic polyneuropathy associated with type 2 diabetes mellitus (HCC)   4. Encounter for diabetic foot exam (HCC)     Plan: Diabetic foot examination performed today. All patient's and/or POA's questions/concerns addressed on today's visit. Toenails 1-5 debrided in length and girth without incident. Continue foot and shoe inspections daily. Monitor blood glucose per PCP/Endocrinologist's recommendations. Continue soft, supportive shoe gear daily. Report any pedal injuries to medical professional. Call office if there are any questions/concerns. -Patient/POA to call should there be question/concern in the interim. Return in about 3 months (around 11/27/2023).  Delon LITTIE Merlin, DPM      [place] LOCATION: [address] [place] [address]                   Office [phone])  [phone]   [facility] LOCATION: [address] [place], [place] [address] Office [phone]

## 2023-08-31 DIAGNOSIS — E1151 Type 2 diabetes mellitus with diabetic peripheral angiopathy without gangrene: Secondary | ICD-10-CM | POA: Diagnosis not present

## 2023-08-31 DIAGNOSIS — I5032 Chronic diastolic (congestive) heart failure: Secondary | ICD-10-CM | POA: Diagnosis not present

## 2023-08-31 DIAGNOSIS — E1140 Type 2 diabetes mellitus with diabetic neuropathy, unspecified: Secondary | ICD-10-CM | POA: Diagnosis not present

## 2023-08-31 DIAGNOSIS — N182 Chronic kidney disease, stage 2 (mild): Secondary | ICD-10-CM | POA: Diagnosis not present

## 2023-08-31 DIAGNOSIS — L0100 Impetigo, unspecified: Secondary | ICD-10-CM | POA: Diagnosis not present

## 2023-08-31 DIAGNOSIS — R1314 Dysphagia, pharyngoesophageal phase: Secondary | ICD-10-CM | POA: Diagnosis not present

## 2023-08-31 DIAGNOSIS — E785 Hyperlipidemia, unspecified: Secondary | ICD-10-CM | POA: Diagnosis not present

## 2023-08-31 DIAGNOSIS — D333 Benign neoplasm of cranial nerves: Secondary | ICD-10-CM | POA: Diagnosis not present

## 2023-08-31 DIAGNOSIS — I2510 Atherosclerotic heart disease of native coronary artery without angina pectoris: Secondary | ICD-10-CM | POA: Diagnosis not present

## 2023-08-31 DIAGNOSIS — E875 Hyperkalemia: Secondary | ICD-10-CM | POA: Diagnosis not present

## 2023-09-13 DIAGNOSIS — E1140 Type 2 diabetes mellitus with diabetic neuropathy, unspecified: Secondary | ICD-10-CM | POA: Diagnosis not present

## 2023-09-13 DIAGNOSIS — E785 Hyperlipidemia, unspecified: Secondary | ICD-10-CM | POA: Diagnosis not present

## 2023-09-13 DIAGNOSIS — R1314 Dysphagia, pharyngoesophageal phase: Secondary | ICD-10-CM | POA: Diagnosis not present

## 2023-09-13 DIAGNOSIS — I2510 Atherosclerotic heart disease of native coronary artery without angina pectoris: Secondary | ICD-10-CM | POA: Diagnosis not present

## 2023-09-13 DIAGNOSIS — N182 Chronic kidney disease, stage 2 (mild): Secondary | ICD-10-CM | POA: Diagnosis not present

## 2023-09-13 DIAGNOSIS — L0100 Impetigo, unspecified: Secondary | ICD-10-CM | POA: Diagnosis not present

## 2023-09-13 DIAGNOSIS — E875 Hyperkalemia: Secondary | ICD-10-CM | POA: Diagnosis not present

## 2023-09-13 DIAGNOSIS — I5032 Chronic diastolic (congestive) heart failure: Secondary | ICD-10-CM | POA: Diagnosis not present

## 2023-09-13 DIAGNOSIS — D333 Benign neoplasm of cranial nerves: Secondary | ICD-10-CM | POA: Diagnosis not present

## 2023-09-13 DIAGNOSIS — E1151 Type 2 diabetes mellitus with diabetic peripheral angiopathy without gangrene: Secondary | ICD-10-CM | POA: Diagnosis not present

## 2023-09-17 DIAGNOSIS — R609 Edema, unspecified: Secondary | ICD-10-CM | POA: Diagnosis not present

## 2023-09-17 DIAGNOSIS — R1312 Dysphagia, oropharyngeal phase: Secondary | ICD-10-CM | POA: Diagnosis not present

## 2023-09-17 DIAGNOSIS — R2689 Other abnormalities of gait and mobility: Secondary | ICD-10-CM | POA: Diagnosis not present

## 2023-09-17 DIAGNOSIS — B379 Candidiasis, unspecified: Secondary | ICD-10-CM | POA: Diagnosis not present

## 2023-09-17 DIAGNOSIS — K219 Gastro-esophageal reflux disease without esophagitis: Secondary | ICD-10-CM | POA: Diagnosis not present

## 2023-09-17 DIAGNOSIS — D333 Benign neoplasm of cranial nerves: Secondary | ICD-10-CM | POA: Diagnosis not present

## 2023-09-17 DIAGNOSIS — R262 Difficulty in walking, not elsewhere classified: Secondary | ICD-10-CM | POA: Diagnosis not present

## 2023-09-17 DIAGNOSIS — M62838 Other muscle spasm: Secondary | ICD-10-CM | POA: Diagnosis not present

## 2023-09-17 DIAGNOSIS — S42121S Displaced fracture of acromial process, right shoulder, sequela: Secondary | ICD-10-CM | POA: Diagnosis not present

## 2023-09-17 DIAGNOSIS — R52 Pain, unspecified: Secondary | ICD-10-CM | POA: Diagnosis not present

## 2023-09-18 DIAGNOSIS — K219 Gastro-esophageal reflux disease without esophagitis: Secondary | ICD-10-CM | POA: Diagnosis not present

## 2023-09-18 DIAGNOSIS — R1312 Dysphagia, oropharyngeal phase: Secondary | ICD-10-CM | POA: Diagnosis not present

## 2023-09-18 DIAGNOSIS — R609 Edema, unspecified: Secondary | ICD-10-CM | POA: Diagnosis not present

## 2023-09-18 DIAGNOSIS — S42121S Displaced fracture of acromial process, right shoulder, sequela: Secondary | ICD-10-CM | POA: Diagnosis not present

## 2023-09-18 DIAGNOSIS — R52 Pain, unspecified: Secondary | ICD-10-CM | POA: Diagnosis not present

## 2023-09-18 DIAGNOSIS — D333 Benign neoplasm of cranial nerves: Secondary | ICD-10-CM | POA: Diagnosis not present

## 2023-09-18 DIAGNOSIS — R262 Difficulty in walking, not elsewhere classified: Secondary | ICD-10-CM | POA: Diagnosis not present

## 2023-09-18 DIAGNOSIS — B379 Candidiasis, unspecified: Secondary | ICD-10-CM | POA: Diagnosis not present

## 2023-09-18 DIAGNOSIS — R2689 Other abnormalities of gait and mobility: Secondary | ICD-10-CM | POA: Diagnosis not present

## 2023-09-19 DIAGNOSIS — L0100 Impetigo, unspecified: Secondary | ICD-10-CM | POA: Diagnosis not present

## 2023-09-19 DIAGNOSIS — R1314 Dysphagia, pharyngoesophageal phase: Secondary | ICD-10-CM | POA: Diagnosis not present

## 2023-09-19 DIAGNOSIS — N182 Chronic kidney disease, stage 2 (mild): Secondary | ICD-10-CM | POA: Diagnosis not present

## 2023-09-19 DIAGNOSIS — E875 Hyperkalemia: Secondary | ICD-10-CM | POA: Diagnosis not present

## 2023-09-19 DIAGNOSIS — E1151 Type 2 diabetes mellitus with diabetic peripheral angiopathy without gangrene: Secondary | ICD-10-CM | POA: Diagnosis not present

## 2023-09-19 DIAGNOSIS — I5032 Chronic diastolic (congestive) heart failure: Secondary | ICD-10-CM | POA: Diagnosis not present

## 2023-09-19 DIAGNOSIS — E1140 Type 2 diabetes mellitus with diabetic neuropathy, unspecified: Secondary | ICD-10-CM | POA: Diagnosis not present

## 2023-09-19 DIAGNOSIS — E785 Hyperlipidemia, unspecified: Secondary | ICD-10-CM | POA: Diagnosis not present

## 2023-09-19 DIAGNOSIS — D333 Benign neoplasm of cranial nerves: Secondary | ICD-10-CM | POA: Diagnosis not present

## 2023-09-19 DIAGNOSIS — I2510 Atherosclerotic heart disease of native coronary artery without angina pectoris: Secondary | ICD-10-CM | POA: Diagnosis not present

## 2023-09-20 DIAGNOSIS — E1151 Type 2 diabetes mellitus with diabetic peripheral angiopathy without gangrene: Secondary | ICD-10-CM | POA: Diagnosis not present

## 2023-09-20 DIAGNOSIS — E1140 Type 2 diabetes mellitus with diabetic neuropathy, unspecified: Secondary | ICD-10-CM | POA: Diagnosis not present

## 2023-09-20 DIAGNOSIS — I679 Cerebrovascular disease, unspecified: Secondary | ICD-10-CM | POA: Diagnosis not present

## 2023-09-20 DIAGNOSIS — E785 Hyperlipidemia, unspecified: Secondary | ICD-10-CM | POA: Diagnosis not present

## 2023-09-20 DIAGNOSIS — D333 Benign neoplasm of cranial nerves: Secondary | ICD-10-CM | POA: Diagnosis not present

## 2023-09-20 DIAGNOSIS — I5032 Chronic diastolic (congestive) heart failure: Secondary | ICD-10-CM | POA: Diagnosis not present

## 2023-09-20 DIAGNOSIS — E875 Hyperkalemia: Secondary | ICD-10-CM | POA: Diagnosis not present

## 2023-09-20 DIAGNOSIS — R262 Difficulty in walking, not elsewhere classified: Secondary | ICD-10-CM | POA: Diagnosis not present

## 2023-09-20 DIAGNOSIS — L0100 Impetigo, unspecified: Secondary | ICD-10-CM | POA: Diagnosis not present

## 2023-09-20 DIAGNOSIS — N182 Chronic kidney disease, stage 2 (mild): Secondary | ICD-10-CM | POA: Diagnosis not present

## 2023-09-23 DIAGNOSIS — I679 Cerebrovascular disease, unspecified: Secondary | ICD-10-CM | POA: Diagnosis not present

## 2023-09-23 DIAGNOSIS — R262 Difficulty in walking, not elsewhere classified: Secondary | ICD-10-CM | POA: Diagnosis not present

## 2023-09-24 DIAGNOSIS — R262 Difficulty in walking, not elsewhere classified: Secondary | ICD-10-CM | POA: Diagnosis not present

## 2023-09-24 DIAGNOSIS — I679 Cerebrovascular disease, unspecified: Secondary | ICD-10-CM | POA: Diagnosis not present

## 2023-09-25 DIAGNOSIS — R262 Difficulty in walking, not elsewhere classified: Secondary | ICD-10-CM | POA: Diagnosis not present

## 2023-09-25 DIAGNOSIS — I679 Cerebrovascular disease, unspecified: Secondary | ICD-10-CM | POA: Diagnosis not present

## 2023-09-29 DIAGNOSIS — R262 Difficulty in walking, not elsewhere classified: Secondary | ICD-10-CM | POA: Diagnosis not present

## 2023-09-29 DIAGNOSIS — I679 Cerebrovascular disease, unspecified: Secondary | ICD-10-CM | POA: Diagnosis not present

## 2023-09-30 DIAGNOSIS — I679 Cerebrovascular disease, unspecified: Secondary | ICD-10-CM | POA: Diagnosis not present

## 2023-09-30 DIAGNOSIS — R262 Difficulty in walking, not elsewhere classified: Secondary | ICD-10-CM | POA: Diagnosis not present

## 2023-10-01 DIAGNOSIS — I679 Cerebrovascular disease, unspecified: Secondary | ICD-10-CM | POA: Diagnosis not present

## 2023-10-01 DIAGNOSIS — R262 Difficulty in walking, not elsewhere classified: Secondary | ICD-10-CM | POA: Diagnosis not present

## 2023-10-02 DIAGNOSIS — E1151 Type 2 diabetes mellitus with diabetic peripheral angiopathy without gangrene: Secondary | ICD-10-CM | POA: Diagnosis not present

## 2023-10-02 DIAGNOSIS — R262 Difficulty in walking, not elsewhere classified: Secondary | ICD-10-CM | POA: Diagnosis not present

## 2023-10-02 DIAGNOSIS — N182 Chronic kidney disease, stage 2 (mild): Secondary | ICD-10-CM | POA: Diagnosis not present

## 2023-10-02 DIAGNOSIS — I2510 Atherosclerotic heart disease of native coronary artery without angina pectoris: Secondary | ICD-10-CM | POA: Diagnosis not present

## 2023-10-02 DIAGNOSIS — E1140 Type 2 diabetes mellitus with diabetic neuropathy, unspecified: Secondary | ICD-10-CM | POA: Diagnosis not present

## 2023-10-02 DIAGNOSIS — L0100 Impetigo, unspecified: Secondary | ICD-10-CM | POA: Diagnosis not present

## 2023-10-02 DIAGNOSIS — R1314 Dysphagia, pharyngoesophageal phase: Secondary | ICD-10-CM | POA: Diagnosis not present

## 2023-10-02 DIAGNOSIS — I679 Cerebrovascular disease, unspecified: Secondary | ICD-10-CM | POA: Diagnosis not present

## 2023-10-02 DIAGNOSIS — E785 Hyperlipidemia, unspecified: Secondary | ICD-10-CM | POA: Diagnosis not present

## 2023-10-02 DIAGNOSIS — D333 Benign neoplasm of cranial nerves: Secondary | ICD-10-CM | POA: Diagnosis not present

## 2023-10-02 DIAGNOSIS — I5032 Chronic diastolic (congestive) heart failure: Secondary | ICD-10-CM | POA: Diagnosis not present

## 2023-10-02 DIAGNOSIS — E875 Hyperkalemia: Secondary | ICD-10-CM | POA: Diagnosis not present

## 2023-10-03 DIAGNOSIS — I679 Cerebrovascular disease, unspecified: Secondary | ICD-10-CM | POA: Diagnosis not present

## 2023-10-03 DIAGNOSIS — R262 Difficulty in walking, not elsewhere classified: Secondary | ICD-10-CM | POA: Diagnosis not present

## 2023-10-04 DIAGNOSIS — I10 Essential (primary) hypertension: Secondary | ICD-10-CM | POA: Diagnosis not present

## 2023-10-06 DIAGNOSIS — I679 Cerebrovascular disease, unspecified: Secondary | ICD-10-CM | POA: Diagnosis not present

## 2023-10-06 DIAGNOSIS — N182 Chronic kidney disease, stage 2 (mild): Secondary | ICD-10-CM | POA: Diagnosis not present

## 2023-10-06 DIAGNOSIS — R262 Difficulty in walking, not elsewhere classified: Secondary | ICD-10-CM | POA: Diagnosis not present

## 2023-10-07 DIAGNOSIS — E875 Hyperkalemia: Secondary | ICD-10-CM | POA: Diagnosis not present

## 2023-10-07 DIAGNOSIS — E1140 Type 2 diabetes mellitus with diabetic neuropathy, unspecified: Secondary | ICD-10-CM | POA: Diagnosis not present

## 2023-10-07 DIAGNOSIS — L0100 Impetigo, unspecified: Secondary | ICD-10-CM | POA: Diagnosis not present

## 2023-10-07 DIAGNOSIS — I5032 Chronic diastolic (congestive) heart failure: Secondary | ICD-10-CM | POA: Diagnosis not present

## 2023-10-07 DIAGNOSIS — R1314 Dysphagia, pharyngoesophageal phase: Secondary | ICD-10-CM | POA: Diagnosis not present

## 2023-10-07 DIAGNOSIS — N182 Chronic kidney disease, stage 2 (mild): Secondary | ICD-10-CM | POA: Diagnosis not present

## 2023-10-07 DIAGNOSIS — E785 Hyperlipidemia, unspecified: Secondary | ICD-10-CM | POA: Diagnosis not present

## 2023-10-07 DIAGNOSIS — I2510 Atherosclerotic heart disease of native coronary artery without angina pectoris: Secondary | ICD-10-CM | POA: Diagnosis not present

## 2023-10-07 DIAGNOSIS — D333 Benign neoplasm of cranial nerves: Secondary | ICD-10-CM | POA: Diagnosis not present

## 2023-10-07 DIAGNOSIS — I679 Cerebrovascular disease, unspecified: Secondary | ICD-10-CM | POA: Diagnosis not present

## 2023-10-07 DIAGNOSIS — E1151 Type 2 diabetes mellitus with diabetic peripheral angiopathy without gangrene: Secondary | ICD-10-CM | POA: Diagnosis not present

## 2023-10-07 DIAGNOSIS — R262 Difficulty in walking, not elsewhere classified: Secondary | ICD-10-CM | POA: Diagnosis not present

## 2023-10-12 NOTE — Progress Notes (Deleted)
 Cardiology Office Note:    Date:  10/12/2023   ID:  Andrea Kirk, DOB March 22, 1940, MRN [id number]  PCP:  Andrea Agent, DO  Cardiologist:  Andrea Reek, MD  Electrophysiologist:  None   Referring MD: Andrea Agent, DO   No chief complaint on file. ***  History of Present Illness:    Andrea Kirk is a 83 y.o. female with a hx of CAD status post CABG 1997, carotid stenosis, diabetes, hypertension, chronic diastolic heart failure who presents for follow-up.  Previously followed with Dr. Reek.  Carotid duplex 01/2023 showed 40 to 59% left carotid stenosis, 1 to 39% right stenosis.  Echocardiogram 02/2021 showed EF 50 to 55%, G3DD, normal RV function, severe left atrial enlargement, moderate right atrial enlargement, moderate tricuspid regurgitation.  Past Medical History:  Diagnosis Date   Acute kidney injury 08/05/2016   Aftercare following surgery of the circulatory system, NEC 05/11/2013   AKI (acute kidney injury) 08/04/2016   Anxiety    takes Celexa  daily   ARF (acute renal failure) 11/07/2016   Asymptomatic stenosis of right carotid artery 03/22/2016   Back pain    occasionally   Carotid artery disease 04/16/2013   Carotid artery occlusion    Carotid stenosis 11/16/2013   Cataract    left and immature   Coronary artery disease    Coronary atherosclerosis of native coronary artery 03/11/2013   S/p CABG in 1997    Depression    Diabetes mellitus    takes Metformin  and Glipizide  daily   Diabetes mellitus (HCC) 05/02/2015   Dizziness    takes Meclizine  daily as needed   Essential hypertension, benign 03/11/2013   GERD (gastroesophageal reflux disease)    takes Omeprazole daily as needed   Headache(784.0)    Hyperlipidemia    takes Atorvastatin  daily   Hypertension    takes Carvedilol  daily   Mixed hyperlipidemia 03/11/2013   Muscle spasm    takes Robaxin  daily as needed   Nausea    takes Phenergan  daily as needed   Occlusion and stenosis of carotid artery without  mention of cerebral infarction 07/19/2011   Pneumonia    hx of-in high school   Restless leg    takes Requip  daily as needed   Seasonal allergies    takes Claritin  daily as needed and Afrin as needed   Shortness of breath    with exertion   Urinary urgency    UTI (urinary tract infection) 11/07/2016    Past Surgical History:  Procedure Laterality Date   COLONOSCOPY WITH PROPOFOL  N/A 03/10/2012   Procedure: COLONOSCOPY WITH PROPOFOL ;  Surgeon: Gladis MARLA Louder, MD;  Location: [facility];  Service: Endoscopy;  Laterality: N/A;   CORNEAL TRANSPLANT Right    CORONARY ARTERY BYPASS GRAFT  1997   x 6   CORONARY ARTERY BYPASS GRAFT  Jan. 1997   ENDARTERECTOMY Left 04/16/2013   Procedure: Left Carotid Artery Endatarectomy with Resection of Redundant Internal Carotid Artery;  Surgeon: Krystal JULIANNA Doing, MD;  Location: [facility] OR;  Service: Vascular;  Laterality: Left;   ENDARTERECTOMY Right 03/22/2016   Procedure: RIGHT ENDARTERECTOMY CAROTID;  Surgeon: Krystal JULIANNA Doing, MD;  Location: [facility] OR;  Service: Vascular;  Laterality: Right;   ESOPHAGOGASTRODUODENOSCOPY N/A 03/10/2012   Procedure: ESOPHAGOGASTRODUODENOSCOPY (EGD);  Surgeon: Gladis MARLA Louder, MD;  Location: [facility];  Service: Endoscopy;  Laterality: N/A;   EYE SURGERY  March 12, 2001   CORNEA TRANSPLANT Right eye   PATCH ANGIOPLASTY Right 03/22/2016   Procedure:  PATCH ANGIOPLASTY;  Surgeon: Krystal JULIANNA Doing, MD;  Location: [facility] OR;  Service: Vascular;  Laterality: Right;   PR VEIN BYPASS GRAFT,AORTO-FEM-POP  1997   SPINE SURGERY  march 2013   Back surgery   TONSILLECTOMY     TRIGGER FINGER RELEASE Left    thumb    Current Medications: No outpatient medications have been marked as taking for the 10/16/23 encounter (Appointment) with Andrea Lonni CROME, MD.     Allergies:   Latex, Codeine, Morphine  and codeine, Cyclobenzaprine, Methocarbamol , and Morphine    Social History   Socioeconomic History   Marital status: Widowed    Spouse name: Not  on file   Number of children: Not on file   Years of education: Not on file   Highest education level: Not on file  Occupational History   Not on file  Tobacco Use   Smoking status: Never   Smokeless tobacco: Never  Vaping Use   Vaping status: Never Used  Substance and Sexual Activity   Alcohol  use: No    Alcohol /week: 0.0 standard drinks of alcohol    Drug use: No   Sexual activity: Never    Birth control/protection: Post-menopausal  Other Topics Concern   Not on file  Social History Narrative   Not on file   Social Drivers of Health   Financial Resource Strain: Not on file  Food Insecurity: Not on file  Transportation Needs: Not on file  Physical Activity: Not on file  Stress: Not on file  Social Connections: Not on file     Family History: The patient's ***family history includes Alcoholism in her maternal grandfather; Cancer in her maternal grandmother; Cirrhosis in her mother; Heart attack in her father, paternal grandfather, and paternal grandmother; Heart disease in her brother and paternal grandfather; Heart disease (age of onset: 62) in her father; Hyperlipidemia in her brother and father.  ROS:   Please see the history of present illness.    *** All other systems reviewed and are negative.  EKGs/Labs/Other Studies Reviewed:    The following studies were reviewed today: ***  EKG:  EKG is *** ordered today.  The ekg ordered today demonstrates ***  Recent Labs: No results found for requested labs within last 365 days.  Recent Lipid Panel    Component Value Date/Time   CHOL 128 05/21/2022 1231   TRIG 322 (H) 05/21/2022 1231   HDL 23 (L) 05/21/2022 1231   CHOLHDL 5.6 (H) 05/21/2022 1231   CHOLHDL 6.9 04/17/2020 0327   VLDL 30 04/17/2020 0327   LDLCALC 55 05/21/2022 1231    Physical Exam:    VS:  There were no vitals taken for this visit.    Wt Readings from Last 3 Encounters:  07/14/23 179 lb 8 oz (81.4 kg)  02/04/23 176 lb (79.8 kg)  01/22/23  176 lb (79.8 kg)     GEN: *** Well nourished, well developed in no acute distress HEENT: Normal NECK: No JVD; No carotid bruits LYMPHATICS: No lymphadenopathy CARDIAC: ***RRR, no murmurs, rubs, gallops RESPIRATORY:  Clear to auscultation without rales, wheezing or rhonchi  ABDOMEN: Soft, non-tender, non-distended MUSCULOSKELETAL:  No edema; No deformity  SKIN: Warm and dry NEUROLOGIC:  Alert and oriented x 3 PSYCHIATRIC:  Normal affect   ASSESSMENT:    No diagnosis found. PLAN:    CAD: status post CABG 1997.  Denies anginal symptoms*** - Continue aspirin  81 mg daily and ticagrelor  90 mg twice daily*** - Continue rosuvastatin  10 mg daily - Continue carvedilol  12.5  mg twice daily  Chronic diastolic heart failure: Echocardiogram 02/2021 showed EF 50 to 55%, G3DD, normal RV function, severe left atrial enlargement, moderate right atrial enlargement, moderate tricuspid regurgitation. - Continue Lasix  20 mg daily  Hypertension: On amlodipine  10 mg daily, carvedilol  12.5 mg twice daily, valsartan 80 mg daily  Hyperlipidemia: On rosuvastatin  10 mg daily  T2DM: On Jardiance, glipizide   Carotid stenosis: Carotid duplex 01/2023 showed 40 to 59% left carotid stenosis, 1 to 39% right stenosis.    RTC in***   Medication Adjustments/Labs and Tests Ordered: Current medicines are reviewed at length with the patient today.  Concerns regarding medicines are outlined above.  No orders of the defined types were placed in this encounter.  No orders of the defined types were placed in this encounter.   There are no Patient Instructions on file for this visit.   Signed, Lonni LITTIE Nanas, MD  10/12/2023 2:15 PM    [facility]

## 2023-10-13 DIAGNOSIS — E1151 Type 2 diabetes mellitus with diabetic peripheral angiopathy without gangrene: Secondary | ICD-10-CM | POA: Diagnosis not present

## 2023-10-13 DIAGNOSIS — D333 Benign neoplasm of cranial nerves: Secondary | ICD-10-CM | POA: Diagnosis not present

## 2023-10-13 DIAGNOSIS — E785 Hyperlipidemia, unspecified: Secondary | ICD-10-CM | POA: Diagnosis not present

## 2023-10-13 DIAGNOSIS — I5032 Chronic diastolic (congestive) heart failure: Secondary | ICD-10-CM | POA: Diagnosis not present

## 2023-10-13 DIAGNOSIS — L0100 Impetigo, unspecified: Secondary | ICD-10-CM | POA: Diagnosis not present

## 2023-10-13 DIAGNOSIS — E1140 Type 2 diabetes mellitus with diabetic neuropathy, unspecified: Secondary | ICD-10-CM | POA: Diagnosis not present

## 2023-10-13 DIAGNOSIS — N182 Chronic kidney disease, stage 2 (mild): Secondary | ICD-10-CM | POA: Diagnosis not present

## 2023-10-13 DIAGNOSIS — E875 Hyperkalemia: Secondary | ICD-10-CM | POA: Diagnosis not present

## 2023-10-13 DIAGNOSIS — I2510 Atherosclerotic heart disease of native coronary artery without angina pectoris: Secondary | ICD-10-CM | POA: Diagnosis not present

## 2023-10-13 DIAGNOSIS — R1314 Dysphagia, pharyngoesophageal phase: Secondary | ICD-10-CM | POA: Diagnosis not present

## 2023-10-14 ENCOUNTER — Ambulatory Visit: Admitting: Gastroenterology

## 2023-10-14 DIAGNOSIS — Z136 Encounter for screening for cardiovascular disorders: Secondary | ICD-10-CM | POA: Diagnosis not present

## 2023-10-14 DIAGNOSIS — E875 Hyperkalemia: Secondary | ICD-10-CM | POA: Diagnosis not present

## 2023-10-14 DIAGNOSIS — L0100 Impetigo, unspecified: Secondary | ICD-10-CM | POA: Diagnosis not present

## 2023-10-14 DIAGNOSIS — E612 Magnesium deficiency: Secondary | ICD-10-CM | POA: Diagnosis not present

## 2023-10-14 DIAGNOSIS — E1151 Type 2 diabetes mellitus with diabetic peripheral angiopathy without gangrene: Secondary | ICD-10-CM | POA: Diagnosis not present

## 2023-10-14 DIAGNOSIS — Z79899 Other long term (current) drug therapy: Secondary | ICD-10-CM | POA: Diagnosis not present

## 2023-10-14 DIAGNOSIS — E785 Hyperlipidemia, unspecified: Secondary | ICD-10-CM | POA: Diagnosis not present

## 2023-10-14 DIAGNOSIS — N182 Chronic kidney disease, stage 2 (mild): Secondary | ICD-10-CM | POA: Diagnosis not present

## 2023-10-14 DIAGNOSIS — I5032 Chronic diastolic (congestive) heart failure: Secondary | ICD-10-CM | POA: Diagnosis not present

## 2023-10-14 DIAGNOSIS — I2510 Atherosclerotic heart disease of native coronary artery without angina pectoris: Secondary | ICD-10-CM | POA: Diagnosis not present

## 2023-10-14 DIAGNOSIS — R1314 Dysphagia, pharyngoesophageal phase: Secondary | ICD-10-CM | POA: Diagnosis not present

## 2023-10-14 DIAGNOSIS — E1140 Type 2 diabetes mellitus with diabetic neuropathy, unspecified: Secondary | ICD-10-CM | POA: Diagnosis not present

## 2023-10-14 DIAGNOSIS — D333 Benign neoplasm of cranial nerves: Secondary | ICD-10-CM | POA: Diagnosis not present

## 2023-10-14 DIAGNOSIS — Z130 Encounter for screening for diseases of the blood and blood-forming organs and certain disorders involving the immune mechanism: Secondary | ICD-10-CM | POA: Diagnosis not present

## 2023-10-15 DIAGNOSIS — L0100 Impetigo, unspecified: Secondary | ICD-10-CM | POA: Diagnosis not present

## 2023-10-15 DIAGNOSIS — N182 Chronic kidney disease, stage 2 (mild): Secondary | ICD-10-CM | POA: Diagnosis not present

## 2023-10-15 DIAGNOSIS — R1314 Dysphagia, pharyngoesophageal phase: Secondary | ICD-10-CM | POA: Diagnosis not present

## 2023-10-15 DIAGNOSIS — I5032 Chronic diastolic (congestive) heart failure: Secondary | ICD-10-CM | POA: Diagnosis not present

## 2023-10-15 DIAGNOSIS — E1151 Type 2 diabetes mellitus with diabetic peripheral angiopathy without gangrene: Secondary | ICD-10-CM | POA: Diagnosis not present

## 2023-10-15 DIAGNOSIS — E875 Hyperkalemia: Secondary | ICD-10-CM | POA: Diagnosis not present

## 2023-10-15 DIAGNOSIS — E785 Hyperlipidemia, unspecified: Secondary | ICD-10-CM | POA: Diagnosis not present

## 2023-10-15 DIAGNOSIS — I2510 Atherosclerotic heart disease of native coronary artery without angina pectoris: Secondary | ICD-10-CM | POA: Diagnosis not present

## 2023-10-15 DIAGNOSIS — D333 Benign neoplasm of cranial nerves: Secondary | ICD-10-CM | POA: Diagnosis not present

## 2023-10-15 DIAGNOSIS — E1140 Type 2 diabetes mellitus with diabetic neuropathy, unspecified: Secondary | ICD-10-CM | POA: Diagnosis not present

## 2023-10-16 ENCOUNTER — Ambulatory Visit: Admitting: Cardiology

## 2023-10-17 ENCOUNTER — Ambulatory Visit: Admitting: Cardiology

## 2023-10-18 DIAGNOSIS — E1140 Type 2 diabetes mellitus with diabetic neuropathy, unspecified: Secondary | ICD-10-CM | POA: Diagnosis not present

## 2023-10-18 DIAGNOSIS — I679 Cerebrovascular disease, unspecified: Secondary | ICD-10-CM | POA: Diagnosis not present

## 2023-10-18 DIAGNOSIS — E1151 Type 2 diabetes mellitus with diabetic peripheral angiopathy without gangrene: Secondary | ICD-10-CM | POA: Diagnosis not present

## 2023-10-18 DIAGNOSIS — N182 Chronic kidney disease, stage 2 (mild): Secondary | ICD-10-CM | POA: Diagnosis not present

## 2023-10-18 DIAGNOSIS — L0100 Impetigo, unspecified: Secondary | ICD-10-CM | POA: Diagnosis not present

## 2023-10-18 DIAGNOSIS — E875 Hyperkalemia: Secondary | ICD-10-CM | POA: Diagnosis not present

## 2023-10-18 DIAGNOSIS — R262 Difficulty in walking, not elsewhere classified: Secondary | ICD-10-CM | POA: Diagnosis not present

## 2023-10-18 DIAGNOSIS — I5032 Chronic diastolic (congestive) heart failure: Secondary | ICD-10-CM | POA: Diagnosis not present

## 2023-10-18 DIAGNOSIS — E785 Hyperlipidemia, unspecified: Secondary | ICD-10-CM | POA: Diagnosis not present

## 2023-10-18 DIAGNOSIS — D333 Benign neoplasm of cranial nerves: Secondary | ICD-10-CM | POA: Diagnosis not present

## 2023-10-19 DIAGNOSIS — D333 Benign neoplasm of cranial nerves: Secondary | ICD-10-CM | POA: Diagnosis not present

## 2023-10-19 DIAGNOSIS — N182 Chronic kidney disease, stage 2 (mild): Secondary | ICD-10-CM | POA: Diagnosis not present

## 2023-10-19 DIAGNOSIS — S72141D Displaced intertrochanteric fracture of right femur, subsequent encounter for closed fracture with routine healing: Secondary | ICD-10-CM | POA: Diagnosis not present

## 2023-10-19 DIAGNOSIS — R262 Difficulty in walking, not elsewhere classified: Secondary | ICD-10-CM | POA: Diagnosis not present

## 2023-10-19 DIAGNOSIS — E875 Hyperkalemia: Secondary | ICD-10-CM | POA: Diagnosis not present

## 2023-10-19 DIAGNOSIS — I5032 Chronic diastolic (congestive) heart failure: Secondary | ICD-10-CM | POA: Diagnosis not present

## 2023-10-19 DIAGNOSIS — L0100 Impetigo, unspecified: Secondary | ICD-10-CM | POA: Diagnosis not present

## 2023-10-19 DIAGNOSIS — I679 Cerebrovascular disease, unspecified: Secondary | ICD-10-CM | POA: Diagnosis not present

## 2023-10-19 DIAGNOSIS — E1140 Type 2 diabetes mellitus with diabetic neuropathy, unspecified: Secondary | ICD-10-CM | POA: Diagnosis not present

## 2023-10-19 DIAGNOSIS — E1151 Type 2 diabetes mellitus with diabetic peripheral angiopathy without gangrene: Secondary | ICD-10-CM | POA: Diagnosis not present

## 2023-10-19 DIAGNOSIS — E785 Hyperlipidemia, unspecified: Secondary | ICD-10-CM | POA: Diagnosis not present

## 2023-10-21 ENCOUNTER — Emergency Department (HOSPITAL_COMMUNITY)

## 2023-10-21 ENCOUNTER — Other Ambulatory Visit: Payer: Self-pay

## 2023-10-21 ENCOUNTER — Encounter (HOSPITAL_COMMUNITY): Payer: Self-pay

## 2023-10-21 ENCOUNTER — Inpatient Hospital Stay (HOSPITAL_COMMUNITY)
Admission: EM | Admit: 2023-10-21 | Discharge: 2023-10-27 | DRG: 481 | Disposition: A | Discharge status: Skilled Nursing Facility | Source: Skilled Nursing Facility | Attending: Family Medicine | Admitting: Family Medicine

## 2023-10-21 DIAGNOSIS — G2581 Restless legs syndrome: Secondary | ICD-10-CM | POA: Diagnosis not present

## 2023-10-21 DIAGNOSIS — E669 Obesity, unspecified: Secondary | ICD-10-CM | POA: Diagnosis present

## 2023-10-21 DIAGNOSIS — Z01818 Encounter for other preprocedural examination: Secondary | ICD-10-CM

## 2023-10-21 DIAGNOSIS — Z83438 Family history of other disorder of lipoprotein metabolism and other lipidemia: Secondary | ICD-10-CM

## 2023-10-21 DIAGNOSIS — S72141A Displaced intertrochanteric fracture of right femur, initial encounter for closed fracture: Principal | ICD-10-CM | POA: Diagnosis present

## 2023-10-21 DIAGNOSIS — S72001A Fracture of unspecified part of neck of right femur, initial encounter for closed fracture: Secondary | ICD-10-CM | POA: Diagnosis not present

## 2023-10-21 DIAGNOSIS — W1830XA Fall on same level, unspecified, initial encounter: Secondary | ICD-10-CM | POA: Diagnosis present

## 2023-10-21 DIAGNOSIS — S299XXA Unspecified injury of thorax, initial encounter: Secondary | ICD-10-CM | POA: Diagnosis not present

## 2023-10-21 DIAGNOSIS — E1122 Type 2 diabetes mellitus with diabetic chronic kidney disease: Secondary | ICD-10-CM | POA: Diagnosis present

## 2023-10-21 DIAGNOSIS — M25551 Pain in right hip: Secondary | ICD-10-CM | POA: Diagnosis not present

## 2023-10-21 DIAGNOSIS — Z8701 Personal history of pneumonia (recurrent): Secondary | ICD-10-CM

## 2023-10-21 DIAGNOSIS — Y92129 Unspecified place in nursing home as the place of occurrence of the external cause: Secondary | ICD-10-CM

## 2023-10-21 DIAGNOSIS — M2170 Unequal limb length (acquired), unspecified site: Secondary | ICD-10-CM | POA: Diagnosis present

## 2023-10-21 DIAGNOSIS — Z7902 Long term (current) use of antithrombotics/antiplatelets: Secondary | ICD-10-CM

## 2023-10-21 DIAGNOSIS — I5032 Chronic diastolic (congestive) heart failure: Secondary | ICD-10-CM | POA: Diagnosis not present

## 2023-10-21 DIAGNOSIS — I440 Atrioventricular block, first degree: Secondary | ICD-10-CM | POA: Diagnosis present

## 2023-10-21 DIAGNOSIS — Z947 Corneal transplant status: Secondary | ICD-10-CM

## 2023-10-21 DIAGNOSIS — Z951 Presence of aortocoronary bypass graft: Secondary | ICD-10-CM

## 2023-10-21 DIAGNOSIS — D62 Acute posthemorrhagic anemia: Secondary | ICD-10-CM | POA: Diagnosis not present

## 2023-10-21 DIAGNOSIS — E782 Mixed hyperlipidemia: Secondary | ICD-10-CM | POA: Diagnosis present

## 2023-10-21 DIAGNOSIS — I272 Pulmonary hypertension, unspecified: Secondary | ICD-10-CM | POA: Diagnosis present

## 2023-10-21 DIAGNOSIS — E785 Hyperlipidemia, unspecified: Secondary | ICD-10-CM | POA: Diagnosis present

## 2023-10-21 DIAGNOSIS — Z8249 Family history of ischemic heart disease and other diseases of the circulatory system: Secondary | ICD-10-CM

## 2023-10-21 DIAGNOSIS — I130 Hypertensive heart and chronic kidney disease with heart failure and stage 1 through stage 4 chronic kidney disease, or unspecified chronic kidney disease: Secondary | ICD-10-CM | POA: Diagnosis present

## 2023-10-21 DIAGNOSIS — Z7982 Long term (current) use of aspirin: Secondary | ICD-10-CM

## 2023-10-21 DIAGNOSIS — F32A Depression, unspecified: Secondary | ICD-10-CM | POA: Diagnosis present

## 2023-10-21 DIAGNOSIS — I6930 Unspecified sequelae of cerebral infarction: Secondary | ICD-10-CM

## 2023-10-21 DIAGNOSIS — E871 Hypo-osmolality and hyponatremia: Secondary | ICD-10-CM | POA: Diagnosis not present

## 2023-10-21 DIAGNOSIS — E1165 Type 2 diabetes mellitus with hyperglycemia: Secondary | ICD-10-CM | POA: Diagnosis not present

## 2023-10-21 DIAGNOSIS — D509 Iron deficiency anemia, unspecified: Secondary | ICD-10-CM | POA: Diagnosis present

## 2023-10-21 DIAGNOSIS — D539 Nutritional anemia, unspecified: Secondary | ICD-10-CM | POA: Diagnosis present

## 2023-10-21 DIAGNOSIS — Z9981 Dependence on supplemental oxygen: Secondary | ICD-10-CM

## 2023-10-21 DIAGNOSIS — S0181XA Laceration without foreign body of other part of head, initial encounter: Secondary | ICD-10-CM | POA: Diagnosis present

## 2023-10-21 DIAGNOSIS — S0990XA Unspecified injury of head, initial encounter: Secondary | ICD-10-CM | POA: Diagnosis not present

## 2023-10-21 DIAGNOSIS — N189 Chronic kidney disease, unspecified: Secondary | ICD-10-CM | POA: Diagnosis present

## 2023-10-21 DIAGNOSIS — S72141D Displaced intertrochanteric fracture of right femur, subsequent encounter for closed fracture with routine healing: Secondary | ICD-10-CM | POA: Diagnosis not present

## 2023-10-21 DIAGNOSIS — I639 Cerebral infarction, unspecified: Secondary | ICD-10-CM | POA: Diagnosis present

## 2023-10-21 DIAGNOSIS — I5042 Chronic combined systolic (congestive) and diastolic (congestive) heart failure: Secondary | ICD-10-CM | POA: Diagnosis present

## 2023-10-21 DIAGNOSIS — K219 Gastro-esophageal reflux disease without esophagitis: Secondary | ICD-10-CM | POA: Diagnosis not present

## 2023-10-21 DIAGNOSIS — Z7984 Long term (current) use of oral hypoglycemic drugs: Secondary | ICD-10-CM | POA: Diagnosis not present

## 2023-10-21 DIAGNOSIS — Z6833 Body mass index (BMI) 33.0-33.9, adult: Secondary | ICD-10-CM | POA: Diagnosis not present

## 2023-10-21 DIAGNOSIS — Z9181 History of falling: Secondary | ICD-10-CM

## 2023-10-21 DIAGNOSIS — I10 Essential (primary) hypertension: Secondary | ICD-10-CM | POA: Diagnosis not present

## 2023-10-21 DIAGNOSIS — I2510 Atherosclerotic heart disease of native coronary artery without angina pectoris: Secondary | ICD-10-CM | POA: Diagnosis not present

## 2023-10-21 DIAGNOSIS — I6529 Occlusion and stenosis of unspecified carotid artery: Secondary | ICD-10-CM | POA: Diagnosis not present

## 2023-10-21 DIAGNOSIS — I110 Hypertensive heart disease with heart failure: Secondary | ICD-10-CM | POA: Diagnosis not present

## 2023-10-21 DIAGNOSIS — F419 Anxiety disorder, unspecified: Secondary | ICD-10-CM | POA: Diagnosis present

## 2023-10-21 DIAGNOSIS — J9611 Chronic respiratory failure with hypoxia: Secondary | ICD-10-CM | POA: Diagnosis present

## 2023-10-21 DIAGNOSIS — Z01810 Encounter for preprocedural cardiovascular examination: Secondary | ICD-10-CM | POA: Diagnosis not present

## 2023-10-21 DIAGNOSIS — J4489 Other specified chronic obstructive pulmonary disease: Secondary | ICD-10-CM | POA: Diagnosis present

## 2023-10-21 DIAGNOSIS — I6523 Occlusion and stenosis of bilateral carotid arteries: Secondary | ICD-10-CM | POA: Diagnosis not present

## 2023-10-21 DIAGNOSIS — Z79899 Other long term (current) drug therapy: Secondary | ICD-10-CM

## 2023-10-21 DIAGNOSIS — Z993 Dependence on wheelchair: Secondary | ICD-10-CM

## 2023-10-21 DIAGNOSIS — W19XXXA Unspecified fall, initial encounter: Secondary | ICD-10-CM | POA: Diagnosis not present

## 2023-10-21 DIAGNOSIS — S199XXA Unspecified injury of neck, initial encounter: Secondary | ICD-10-CM | POA: Diagnosis not present

## 2023-10-21 DIAGNOSIS — I6782 Cerebral ischemia: Secondary | ICD-10-CM | POA: Diagnosis not present

## 2023-10-21 DIAGNOSIS — M25572 Pain in left ankle and joints of left foot: Secondary | ICD-10-CM | POA: Diagnosis not present

## 2023-10-21 DIAGNOSIS — M47816 Spondylosis without myelopathy or radiculopathy, lumbar region: Secondary | ICD-10-CM | POA: Diagnosis not present

## 2023-10-21 DIAGNOSIS — S0101XA Laceration without foreign body of scalp, initial encounter: Secondary | ICD-10-CM | POA: Diagnosis not present

## 2023-10-21 DIAGNOSIS — I779 Disorder of arteries and arterioles, unspecified: Secondary | ICD-10-CM | POA: Diagnosis present

## 2023-10-21 DIAGNOSIS — K449 Diaphragmatic hernia without obstruction or gangrene: Secondary | ICD-10-CM | POA: Diagnosis present

## 2023-10-21 LAB — CBC
HCT: 31.9 % — ABNORMAL LOW (ref 36.0–46.0)
Hemoglobin: 10.4 g/dL — ABNORMAL LOW (ref 12.0–15.0)
MCH: 35 pg — ABNORMAL HIGH (ref 26.0–34.0)
MCHC: 32.6 g/dL (ref 30.0–36.0)
MCV: 107.4 fL — ABNORMAL HIGH (ref 80.0–100.0)
Platelets: 189 K/uL (ref 150–400)
RBC: 2.97 MIL/uL — ABNORMAL LOW (ref 3.87–5.11)
RDW: 13.2 % (ref 11.5–15.5)
WBC: 6.1 K/uL (ref 4.0–10.5)
nRBC: 0 % (ref 0.0–0.2)

## 2023-10-21 LAB — RETICULOCYTES
Immature Retic Fract: 20.4 % — ABNORMAL HIGH (ref 2.3–15.9)
RBC.: 2.97 MIL/uL — ABNORMAL LOW (ref 3.87–5.11)
Retic Count, Absolute: 68.6 K/uL (ref 19.0–186.0)
Retic Ct Pct: 2.3 % (ref 0.4–3.1)

## 2023-10-21 LAB — HEMOGLOBIN A1C
Hgb A1c MFr Bld: 7.5 % — ABNORMAL HIGH (ref 4.8–5.6)
Mean Plasma Glucose: 168.55 mg/dL

## 2023-10-21 LAB — COMPREHENSIVE METABOLIC PANEL WITH GFR
ALT: 52 U/L — ABNORMAL HIGH (ref 0–44)
AST: 63 U/L — ABNORMAL HIGH (ref 15–41)
Albumin: 3.4 g/dL — ABNORMAL LOW (ref 3.5–5.0)
Alkaline Phosphatase: 115 U/L (ref 38–126)
Anion gap: 10 (ref 5–15)
BUN: 49 mg/dL — ABNORMAL HIGH (ref 8–23)
CO2: 32 mmol/L (ref 22–32)
Calcium: 8.9 mg/dL (ref 8.9–10.3)
Chloride: 92 mmol/L — ABNORMAL LOW (ref 98–111)
Creatinine, Ser: 1.29 mg/dL — ABNORMAL HIGH (ref 0.44–1.00)
GFR, Estimated: 41 mL/min — ABNORMAL LOW (ref >60)
Glucose, Bld: 339 mg/dL — ABNORMAL HIGH (ref 70–99)
Potassium: 4.4 mmol/L (ref 3.5–5.1)
Sodium: 134 mmol/L — ABNORMAL LOW (ref 135–145)
Total Bilirubin: 0.8 mg/dL (ref 0.0–1.2)
Total Protein: 7.1 g/dL (ref 6.5–8.1)

## 2023-10-21 LAB — I-STAT CHEM 8, ED
BUN: 52 mg/dL — ABNORMAL HIGH (ref 8–23)
Calcium, Ion: 1.17 mmol/L (ref 1.15–1.40)
Chloride: 92 mmol/L — ABNORMAL LOW (ref 98–111)
Creatinine, Ser: 1.3 mg/dL — ABNORMAL HIGH (ref 0.44–1.00)
Glucose, Bld: 334 mg/dL — ABNORMAL HIGH (ref 70–99)
HCT: 34 % — ABNORMAL LOW (ref 36.0–46.0)
Hemoglobin: 11.6 g/dL — ABNORMAL LOW (ref 12.0–15.0)
Potassium: 4.5 mmol/L (ref 3.5–5.1)
Sodium: 135 mmol/L (ref 135–145)
TCO2: 35 mmol/L — ABNORMAL HIGH (ref 22–32)

## 2023-10-21 LAB — CK: Total CK: 59 U/L (ref 38–234)

## 2023-10-21 LAB — PHOSPHORUS: Phosphorus: 4.1 mg/dL (ref 2.5–4.6)

## 2023-10-21 LAB — I-STAT CG4 LACTIC ACID, ED: Lactic Acid, Venous: 0.9 mmol/L (ref 0.5–1.9)

## 2023-10-21 LAB — CBG MONITORING, ED: Glucose-Capillary: 257 mg/dL — ABNORMAL HIGH (ref 70–99)

## 2023-10-21 LAB — TROPONIN I (HIGH SENSITIVITY): Troponin I (High Sensitivity): 7 ng/L (ref <18)

## 2023-10-21 LAB — MAGNESIUM: Magnesium: 2.2 mg/dL (ref 1.7–2.4)

## 2023-10-21 LAB — PROTIME-INR
INR: 1 (ref 0.8–1.2)
Prothrombin Time: 13.7 s (ref 11.4–15.2)

## 2023-10-21 LAB — ETHANOL: Alcohol, Ethyl (B): <15 mg/dL (ref <15)

## 2023-10-21 MED ORDER — FENTANYL CITRATE (PF) 50 MCG/ML IJ SOSY
50.0000 ug | PREFILLED_SYRINGE | INTRAMUSCULAR | Status: DC | PRN
  Administered 2023-10-21 – 2023-10-27 (×12): 50 ug via INTRAVENOUS
  Filled 2023-10-21 (×14): qty 1

## 2023-10-21 MED ORDER — FENTANYL CITRATE (PF) 50 MCG/ML IJ SOSY
50.0000 ug | PREFILLED_SYRINGE | Freq: Once | INTRAMUSCULAR | Status: AC
  Administered 2023-10-21: 50 ug via INTRAVENOUS
  Filled 2023-10-21: qty 1

## 2023-10-21 MED ORDER — METHOCARBAMOL 500 MG PO TABS
500.0000 mg | ORAL_TABLET | Freq: Four times a day (QID) | ORAL | Status: DC | PRN
  Administered 2023-10-21 – 2023-10-27 (×13): 500 mg via ORAL
  Filled 2023-10-21 (×14): qty 1

## 2023-10-21 MED ORDER — METHOCARBAMOL 1000 MG/10ML IJ SOLN
500.0000 mg | Freq: Four times a day (QID) | INTRAMUSCULAR | Status: DC | PRN
  Administered 2023-10-22: 500 mg via INTRAVENOUS
  Filled 2023-10-21: qty 10

## 2023-10-21 MED ORDER — INSULIN ASPART 100 UNIT/ML IJ SOLN
0.0000 [IU] | INTRAMUSCULAR | Status: DC
  Administered 2023-10-21 – 2023-10-22 (×2): 5 [IU] via SUBCUTANEOUS
  Administered 2023-10-22: 2 [IU] via SUBCUTANEOUS
  Administered 2023-10-22: 5 [IU] via SUBCUTANEOUS
  Administered 2023-10-23: 3 [IU] via SUBCUTANEOUS
  Administered 2023-10-23: 7 [IU] via SUBCUTANEOUS
  Administered 2023-10-23: 3 [IU] via SUBCUTANEOUS
  Administered 2023-10-23: 5 [IU] via SUBCUTANEOUS
  Administered 2023-10-23: 7 [IU] via SUBCUTANEOUS
  Administered 2023-10-23: 3 [IU] via SUBCUTANEOUS
  Administered 2023-10-23: 5 [IU] via SUBCUTANEOUS
  Administered 2023-10-24 (×2): 2 [IU] via SUBCUTANEOUS

## 2023-10-21 MED ORDER — HYDROCODONE-ACETAMINOPHEN 5-325 MG PO TABS
1.0000 | ORAL_TABLET | Freq: Four times a day (QID) | ORAL | Status: DC | PRN
  Administered 2023-10-23 – 2023-10-24 (×5): 2 via ORAL
  Filled 2023-10-21 (×6): qty 2

## 2023-10-21 NOTE — ED Triage Notes (Signed)
 PT arrived via [facility] from [place]. PT had a fall and hit her right side of the head and complaining of right hip pain and chest pain. PT has a shortened right leg as well. VSS and alert and talking. PT states pain is 10/10

## 2023-10-21 NOTE — ED Notes (Addendum)
 Trauma Response Nurse Documentation   Nereida L Mifsud is a 83 y.o. female arriving to [facility] ED via EMS  On Brilinta  (ticagrelor ) 90 mg bid. Trauma was activated as a Level 2 by ED charge RN based on the following trauma criteria Elderly patients > 65 with head trauma on anti-coagulation (excluding ASA).  Patient cleared for CT by Dr. Melvenia EDP. Pt transported to CT with trauma response nurse present to monitor. RN remained with the patient throughout their absence from the department for clinical observation.   GCS 15.  History   Past Medical History:  Diagnosis Date   Acute kidney injury 08/05/2016   Aftercare following surgery of the circulatory system, NEC 05/11/2013   AKI (acute kidney injury) 08/04/2016   Anxiety    takes Celexa  daily   ARF (acute renal failure) 11/07/2016   Asymptomatic stenosis of right carotid artery 03/22/2016   Back pain    occasionally   Carotid artery disease 04/16/2013   Carotid artery occlusion    Carotid stenosis 11/16/2013   Cataract    left and immature   Coronary artery disease    Coronary atherosclerosis of native coronary artery 03/11/2013   S/p CABG in 1997    Depression    Diabetes mellitus    takes Metformin  and Glipizide  daily   Diabetes mellitus (HCC) 05/02/2015   Dizziness    takes Meclizine  daily as needed   Essential hypertension, benign 03/11/2013   GERD (gastroesophageal reflux disease)    takes Omeprazole daily as needed   Headache(784.0)    Hyperlipidemia    takes Atorvastatin  daily   Hypertension    takes Carvedilol  daily   Mixed hyperlipidemia 03/11/2013   Muscle spasm    takes Robaxin  daily as needed   Nausea    takes Phenergan  daily as needed   Occlusion and stenosis of carotid artery without mention of cerebral infarction 07/19/2011   Pneumonia    hx of-in high school   Restless leg    takes Requip  daily as needed   Seasonal allergies    takes Claritin  daily as needed and Afrin as needed   Shortness of breath    with  exertion   Urinary urgency    UTI (urinary tract infection) 11/07/2016     Past Surgical History:  Procedure Laterality Date   COLONOSCOPY WITH PROPOFOL  N/A 03/10/2012   Procedure: COLONOSCOPY WITH PROPOFOL ;  Surgeon: Gladis MARLA Louder, MD;  Location: [facility];  Service: Endoscopy;  Laterality: N/A;   CORNEAL TRANSPLANT Right    CORONARY ARTERY BYPASS GRAFT  1997   x 6   CORONARY ARTERY BYPASS GRAFT  Jan. 1997   ENDARTERECTOMY Left 04/16/2013   Procedure: Left Carotid Artery Endatarectomy with Resection of Redundant Internal Carotid Artery;  Surgeon: Krystal JULIANNA Doing, MD;  Location: [facility] OR;  Service: Vascular;  Laterality: Left;   ENDARTERECTOMY Right 03/22/2016   Procedure: RIGHT ENDARTERECTOMY CAROTID;  Surgeon: Krystal JULIANNA Doing, MD;  Location: [facility] OR;  Service: Vascular;  Laterality: Right;   ESOPHAGOGASTRODUODENOSCOPY N/A 03/10/2012   Procedure: ESOPHAGOGASTRODUODENOSCOPY (EGD);  Surgeon: Gladis MARLA Louder, MD;  Location: [facility];  Service: Endoscopy;  Laterality: N/A;   EYE SURGERY  March 12, 2001   CORNEA TRANSPLANT Right eye   PATCH ANGIOPLASTY Right 03/22/2016   Procedure: PATCH ANGIOPLASTY;  Surgeon: Krystal JULIANNA Doing, MD;  Location: [facility] OR;  Service: Vascular;  Laterality: Right;   PR VEIN BYPASS GRAFT,AORTO-FEM-POP  1997   SPINE SURGERY  march 2013  Back surgery   TONSILLECTOMY     TRIGGER FINGER RELEASE Left    thumb       Initial Focused Assessment (If applicable, or please see trauma documentation): Alert/oriented female presents via EMS from SNF after a fall. Right hip pain, shortening and rotation noted, decreased pulses. No other trauma noted.  Airway patent, BS clear No obvious hemorrhage GCS 15 [id number]  CT's Completed:   CT Head and CT C-Spine  CAP non contrast Interventions:  Trauma lab draw Portable chest and pelvis CT head, c-spine, CAP noncon  Plan for disposition:  Admit  Consults completed:  Orthopaedic Surgeon Reyne paged at 2052, call returned  Event  Summary: Presents via EMS from SNF after a fall, presents with shortening and rotation to right leg, c/o right leg pain. Escorted to CT following portable XRAYS, placed on hospital bed upon completion of CT scans.    Bedside handoff with ED RN Payden.    Salinda Snedeker O Derin Granquist  Trauma Response RN  Please call TRN at [phone] for further assistance.

## 2023-10-21 NOTE — Assessment & Plan Note (Addendum)
 Chronic systolic diastolic CHF current appears to be euvolemic

## 2023-10-21 NOTE — ED Notes (Signed)
Dermabond bedside. 

## 2023-10-21 NOTE — Assessment & Plan Note (Signed)
 -  Order Sensitive  SSI     -  check TSH and HgA1C  - Hold by mouth medications*

## 2023-10-21 NOTE — Progress Notes (Signed)
 Orthopedic Tech Progress Note Patient Details:  Andrea Kirk [birth year] [id number]  Patient ID: [name], female   DOB: September 16, 1940, 83 y.o.   MRN: [id number] Responded to level 2 trauma, ortho techs currently not needed Camellia Bo 10/21/2023, 8:31 PM

## 2023-10-21 NOTE — ED Notes (Signed)
 Called and placed PT on monitor with CCMD

## 2023-10-21 NOTE — H&P (Signed)
 Andrea Kirk FMW:[id number] DOB: 02-Sep-1940 DOA: 10/21/2023    PCP: System, Provider Not In   Outpatient Specialists:  CARDS:   Dr. Candyce Reek, MD    Patient arrived to ER on 10/21/23 at 2003 Referred by Attending Melvenia Motto, MD   Patient coming from:  From facility  [facility]  Chief Complaint:  Chief Complaint  Patient presents with   Fall    HPI: Andrea Kirk is a 83 y.o. female with medical history significant of Dm2.  Carotid artery stenosis, CAD status post CABG in 1997, depression hypertension GERD HLD restless leg, chronic diastolic CHF multiple falls, hx of stroke    Presented with  mechanical fall she was putting something in a trash can and ended up up the floor Patient arrives from [place] she fell down and hit her right side hit her head and right hip noted to have shortening right leg Patient has known history of diabetes CAD she is on Plavix  Chronic respiratory failure for which she is on 3 L of oxygen  at baseline Orthopedics been consulted orthopedic surgeon, Dr. Reyne,     On aspirin  and Brilinta  At baseline walks with a walker  But sometimes use wheelchair   Denies significant ETOH intake   Does not smoke      Regarding pertinent Chronic problems:    Hyperlipidemia - on statins crestor  Lipid Panel     Component Value Date/Time   CHOL 128 05/21/2022 1231   TRIG 322 (H) 05/21/2022 1231   HDL 23 (L) 05/21/2022 1231   CHOLHDL 5.6 (H) 05/21/2022 1231   CHOLHDL 6.9 04/17/2020 0327   VLDL 30 04/17/2020 0327   LDLCALC 55 05/21/2022 1231   LABVLDL 50 (H) 05/21/2022 1231     HTN on valsartan, amlodipine , coreg , lasix     chronic CHF diastolic/systolic/ combined - last echo  Recent Results (from the past 56199 hours)  ECHOCARDIOGRAM COMPLETE   Collection Time: 02/15/21 10:49 AM  Result Value   Weight 2,691.38   Height 65   BP 149/73   S' Lateral 3.10   AR max vel 2.43   AV Area VTI 2.22   AV Mean grad 9.0   AV Peak grad  15.7   Ao pk vel 1.98   Area-P 1/2 3.60   AV Area mean vel 2.23   MV VTI 2.46   Narrative      ECHOCARDIOGRAM REPORT        1. Left ventricular ejection fraction, by estimation, is 50 to 55%. The left ventricle has low normal function. The left ventricle has no regional wall motion abnormalities. There is mild concentric left ventricular hypertrophy. Left ventricular  diastolic parameters are consistent with Grade III diastolic dysfunction (restrictive). Elevated left ventricular end-diastolic pressure.  2. Right ventricular systolic function is normal. The right ventricular size is mildly enlarged. There is severely elevated pulmonary artery systolic pressure. The estimated right ventricular systolic pressure is 60.1 mmHg.  3. Left atrial size was severely dilated.  4. Right atrial size was moderately dilated.  5. The mitral valve is grossly normal. Mild mitral valve regurgitation. No evidence of mitral stenosis. Moderate mitral annular calcification.  6. Tricuspid valve regurgitation is moderate.  7. The aortic valve is tricuspid. There is mild calcification of the aortic valve. There is mild thickening of the aortic valve. Aortic valve regurgitation is not visualized. Aortic valve sclerosis/calcification is present, without any evidence of  aortic stenosis.  8. The inferior vena cava is normal in  size with <50% respiratory variability, suggesting right atrial pressure of 8 mmHg.  Comparison(s): No significant change from prior study. Prior images reviewed side by side.  Conclusion(s)/Recommendation(s): Otherwise normal echocardiogram, with minor abnormalities described in the report. Severely elevated right sided pressures, restrictive LV filling (grade 3 diastolic dysfunction).           CAD status post CABG- On Aspirin , statin,   Brilinta                 -  followed by cardiology                    DM 2 -  Lab Results  Component Value Date   HGBA1C 8.1 (H) 05/28/2022   PO meds  only     obesity-   BMI Readings from Last 1 Encounters:  10/21/23 33.91 kg/m     COPD - not  followed by pulmonology    on baseline oxygen  3 L,     Hx of CVA - *with/out residual deficits on Aspirin  81 mg, 325, Plavix      CKD stage IIIa   baseline Cr 1.3 Estimated Creatinine Clearance: 33.8 mL/min (A) (by C-G formula based on SCr of 1.29 mg/dL (H)).  Lab Results  Component Value Date   CREATININE 1.29 (H) 10/21/2023   CREATININE 1.30 (H) 10/21/2023   CREATININE 1.06 (H) 05/31/2022   Lab Results  Component Value Date   NA 134 (L) 10/21/2023   CL 92 (L) 10/21/2023   K 4.4 10/21/2023   CO2 32 10/21/2023   BUN 49 (H) 10/21/2023   CREATININE 1.29 (H) 10/21/2023   GFRNONAA 41 (L) 10/21/2023   CALCIUM  8.9 10/21/2023   PHOS 2.9 04/20/2020   ALBUMIN 3.4 (L) 10/21/2023   GLUCOSE 339 (H) 10/21/2023     Hepatic Function Panel     Component Value Date/Time   PROT 7.1 10/21/2023 2024   PROT 7.1 05/21/2022 1231   ALBUMIN 3.4 (L) 10/21/2023 2024   ALBUMIN 4.2 05/21/2022 1231   AST 63 (H) 10/21/2023 2024   AST 25 10/05/2019 1427   ALT 52 (H) 10/21/2023 2024   ALT 19 10/05/2019 1427   ALKPHOS 115 10/21/2023 2024   BILITOT 0.8 10/21/2023 2024   BILITOT 0.3 05/21/2022 1231   BILITOT 0.7 10/05/2019 1427   BILIDIR 0.1 06/17/2014 1926   IBILI 0.3 06/17/2014 1926   1.0  Chronic anemia - baseline hg Hemoglobin & Hematocrit  Recent Labs    10/21/23 2020 10/21/23 2024  HGB 11.6* 10.4*   Iron/TIBC/Ferritin/ %Sat    Component Value Date/Time   IRON 28 04/20/2020 0149   TIBC 245 (L) 04/20/2020 0149   FERRITIN 573 (H) 04/20/2020 0149   IRONPCTSAT 11 04/20/2020 0149     While in ER:   Found to have right intertrochanteric fracture orthopedics aware CT  did show small right lower lobe airspace disease    Lab Orders         Comprehensive metabolic panel         CBC         Ethanol         Urinalysis, Routine w reflex microscopic -Urine, Clean Catch         Protime-INR          I-Stat Chem 8, ED         I-Stat Lactic Acid, ED     Pelvis - Acute comminuted and displaced right intertrochanteric fracture.  CT HEAD   NON  acute  Ct neck non acute    CXR - NON acute  CTabd/pelvis chest-  Comminuted right intertrochanteric femur fracture with superolateral displacement of the distal fragment. 2. Small right lower lobe airspace disease. No pleural effusion or pneumothorax.    Following Medications were ordered in ER: Medications  fentaNYL  (SUBLIMAZE ) injection 50 mcg (50 mcg Intravenous Given 10/21/23 2128)  fentaNYL  (SUBLIMAZE ) injection 50 mcg (50 mcg Intravenous Given 10/21/23 2020)    _______________________________________________________ ER Provider Called:    Orthopedics   They Recommend admit to medicine   Will see in AM        ED Triage Vitals  Encounter Vitals Group     BP 10/21/23 2007 (!) 144/62     Girls Systolic BP Percentile --      Girls Diastolic BP Percentile --      Boys Systolic BP Percentile --      Boys Diastolic BP Percentile --      Pulse Rate 10/21/23 2012 (!) 57     Resp 10/21/23 2012 18     Temp 10/21/23 2007 98.1 F (36.7 C)     Temp src --      SpO2 10/21/23 2012 100 %     Weight 10/21/23 2013 178 lb (80.7 kg)     Height 10/21/23 2013 5' 2 (1.575 m)     Head Circumference --      Peak Flow --      Pain Score 10/21/23 2013 10     Pain Loc --      Pain Education --      Exclude from Growth Chart --   UFJK(75)@     _________________________________________ Significant initial  Findings: Abnormal Labs Reviewed  COMPREHENSIVE METABOLIC PANEL WITH GFR - Abnormal; Notable for the following components:      Result Value   Sodium 134 (*)    Chloride 92 (*)    Glucose, Bld 339 (*)    BUN 49 (*)    Creatinine, Ser 1.29 (*)    Albumin 3.4 (*)    AST 63 (*)    ALT 52 (*)    GFR, Estimated 41 (*)    All other components within normal limits  CBC - Abnormal; Notable for the following components:   RBC 2.97 (*)     Hemoglobin 10.4 (*)    HCT 31.9 (*)    MCV 107.4 (*)    MCH 35.0 (*)    All other components within normal limits  I-STAT CHEM 8, ED - Abnormal; Notable for the following components:   Chloride 92 (*)    BUN 52 (*)    Creatinine, Ser 1.30 (*)    Glucose, Bld 334 (*)    TCO2 35 (*)    Hemoglobin 11.6 (*)    HCT 34.0 (*)    All other components within normal limits     ECG: Ordered Personally reviewed and interpreted by me showing: HR : 68 Rhythm:Junctional rhythm Right bundle branch block Nonspecific T abnormalities, lateral leads QTC 466    The recent clinical data is shown below. Vitals:   10/21/23 2012 10/21/23 2013 10/21/23 2039 10/21/23 2040  BP:    114/76  Pulse: (!) 57   (!) 59  Resp: 18   13  Temp: 98.1 F (36.7 C)     SpO2: 100%   99%  Weight:  80.7 kg 84.1 kg   Height:  5' 2 (1.575 m)      WBC  Component Value Date/Time   WBC 6.1 10/21/2023 2024   LYMPHSABS 0.3 (L) 05/30/2022 0347   MONOABS 0.4 05/30/2022 0347   EOSABS 0.0 05/30/2022 0347   BASOSABS 0.0 05/30/2022 0347    Lactic Acid, Venous    Component Value Date/Time   LATICACIDVEN 0.9 10/21/2023 2021     Procalcitonin   Ordered     Results for orders placed or performed during the hospital encounter of 05/28/22  Blood Culture (routine x 2)     Status: None   Collection Time: 05/28/22  1:15 AM   Specimen: BLOOD  Result Value Ref Range Status   Specimen Description BLOOD RIGHT ANTECUBITAL  Final   Special Requests   Final    BOTTLES DRAWN AEROBIC AND ANAEROBIC Blood Culture results may not be optimal due to an inadequate volume of blood received in culture bottles   Culture   Final    NO GROWTH 5 DAYS Performed at [facility], [address] [place], [place] [address]    Report Status 06/02/2022 FINAL  Final  Blood Culture (routine x 2)     Status: Abnormal   Collection Time: 05/28/22  1:30 AM   Specimen: BLOOD RIGHT FOREARM  Result Value Ref Range Status   Specimen  Description BLOOD RIGHT FOREARM  Final   Special Requests   Final    BOTTLES DRAWN AEROBIC AND ANAEROBIC Blood Culture results may not be optimal due to an excessive volume of blood received in culture bottles   Culture  Setup Time   Final    GRAM NEGATIVE RODS IN BOTH AEROBIC AND ANAEROBIC BOTTLES CRITICAL RESULT CALLED TO, READ BACK BY AND VERIFIED WITH: PHARMD CARLA JARDIN ON 05/28/22 @ 1623 BY DRT Performed at [facility], [address], [place], [place] [address]    Culture ESCHERICHIA COLI (A)  Final   Report Status 05/30/2022 FINAL  Final   Organism ID, Bacteria ESCHERICHIA COLI  Final      Susceptibility   Escherichia coli - MIC*    AMPICILLIN >=32 RESISTANT Resistant     CEFEPIME  <=0.12 SENSITIVE Sensitive     CEFTAZIDIME <=1 SENSITIVE Sensitive     CIPROFLOXACIN <=0.25 SENSITIVE Sensitive     GENTAMICIN <=1 SENSITIVE Sensitive     IMIPENEM <=0.25 SENSITIVE Sensitive     TRIMETH /SULFA  >=320 RESISTANT Resistant     AMPICILLIN/SULBACTAM 16 INTERMEDIATE Intermediate     PIP/TAZO <=4 SENSITIVE Sensitive     * ESCHERICHIA COLI  Blood Culture ID Panel (Reflexed)     Status: Abnormal   Collection Time: 05/28/22  1:30 AM  Result Value Ref Range Status   Enterococcus faecalis NOT DETECTED NOT DETECTED Final   Enterococcus Faecium NOT DETECTED NOT DETECTED Final   Listeria monocytogenes NOT DETECTED NOT DETECTED Final   Staphylococcus species NOT DETECTED NOT DETECTED Final   Staphylococcus aureus (BCID) NOT DETECTED NOT DETECTED Final   Staphylococcus epidermidis NOT DETECTED NOT DETECTED Final   Staphylococcus lugdunensis NOT DETECTED NOT DETECTED Final   Streptococcus species NOT DETECTED NOT DETECTED Final   Streptococcus agalactiae NOT DETECTED NOT DETECTED Final   Streptococcus pneumoniae NOT DETECTED NOT DETECTED Final   Streptococcus pyogenes NOT DETECTED NOT DETECTED Final   A.calcoaceticus-baumannii NOT DETECTED NOT DETECTED Final   Bacteroides fragilis NOT  DETECTED NOT DETECTED Final   Enterobacterales DETECTED (A) NOT DETECTED Final    Comment: Enterobacterales represent a large order of gram negative bacteria, not a single organism. CRITICAL RESULT CALLED TO,  READ BACK BY AND VERIFIED WITH: PHARMD CARLA JARDIN ON 05/28/22 @ 1623 BY DRT    Enterobacter cloacae complex NOT DETECTED NOT DETECTED Final   Escherichia coli DETECTED (A) NOT DETECTED Final    Comment: CRITICAL RESULT CALLED TO, READ BACK BY AND VERIFIED WITH: PHARMD CARLA JARDIN ON 05/28/22 @ 1623 BY DRT    Klebsiella aerogenes NOT DETECTED NOT DETECTED Final   Klebsiella oxytoca NOT DETECTED NOT DETECTED Final   Klebsiella pneumoniae NOT DETECTED NOT DETECTED Final   Proteus species NOT DETECTED NOT DETECTED Final   Salmonella species NOT DETECTED NOT DETECTED Final   Serratia marcescens NOT DETECTED NOT DETECTED Final   Haemophilus influenzae NOT DETECTED NOT DETECTED Final   Neisseria meningitidis NOT DETECTED NOT DETECTED Final   Pseudomonas aeruginosa NOT DETECTED NOT DETECTED Final   Stenotrophomonas maltophilia NOT DETECTED NOT DETECTED Final   Candida albicans NOT DETECTED NOT DETECTED Final   Candida auris NOT DETECTED NOT DETECTED Final   Candida glabrata NOT DETECTED NOT DETECTED Final   Candida krusei NOT DETECTED NOT DETECTED Final   Candida parapsilosis NOT DETECTED NOT DETECTED Final   Candida tropicalis NOT DETECTED NOT DETECTED Final   Cryptococcus neoformans/gattii NOT DETECTED NOT DETECTED Final   CTX-M ESBL NOT DETECTED NOT DETECTED Final   Carbapenem resistance IMP NOT DETECTED NOT DETECTED Final   Carbapenem resistance KPC NOT DETECTED NOT DETECTED Final   Carbapenem resistance NDM NOT DETECTED NOT DETECTED Final   Carbapenem resist OXA 48 LIKE NOT DETECTED NOT DETECTED Final   Carbapenem resistance VIM NOT DETECTED NOT DETECTED Final    Comment: Performed at [facility], [address] [place], [place] [address]  Resp panel by RT-PCR (RSV, Flu  A&B, Covid) Anterior Nasal Swab     Status: None   Collection Time: 05/28/22  2:38 AM   Specimen: Anterior Nasal Swab  Result Value Ref Range Status   SARS Coronavirus 2 by RT PCR NEGATIVE NEGATIVE Final   Influenza A by PCR NEGATIVE NEGATIVE Final   Influenza B by PCR NEGATIVE NEGATIVE Final    Comment: (NOTE) The Xpert Xpress SARS-CoV-2/FLU/RSV plus assay is intended as an aid in the diagnosis of influenza from Nasopharyngeal swab specimens and should not be used as a sole basis for treatment. Nasal washings and aspirates are unacceptable for Xpert Xpress SARS-CoV-2/FLU/RSV testing.  Fact Sheet for Patients: BloggerCourse.com  Fact Sheet for Healthcare Providers: SeriousBroker.it  This test is not yet approved or cleared by the [place]  FDA and has been authorized for detection and/or diagnosis of SARS-CoV-2 by FDA under an Emergency Use Authorization (EUA). This EUA will remain in effect (meaning this test can be used) for the duration of the COVID-19 declaration under Section 564(b)(1) of the Act, 21 U.S.C. section 360bbb-3(b)(1), unless the authorization is terminated or revoked.     Resp Syncytial Virus by PCR NEGATIVE NEGATIVE Final    Comment: (NOTE) Fact Sheet for Patients: BloggerCourse.com  Fact Sheet for Healthcare Providers: SeriousBroker.it  This test is not yet approved or cleared by the [place]  FDA and has been authorized for detection and/or diagnosis of SARS-CoV-2 by FDA under an Emergency Use Authorization (EUA). This EUA will remain in effect (meaning this test can be used) for the duration of the COVID-19 declaration under Section 564(b)(1) of the Act, 21 U.S.C. section 360bbb-3(b)(1), unless the authorization is terminated or revoked.  Performed at [facility], [address], [place], [place] [address]   Urine  Culture     Status:  Abnormal   Collection Time: 05/28/22  3:12 AM   Specimen: Urine, Random  Result Value Ref Range Status   Specimen Description URINE, RANDOM  Final   Special Requests   Final    NONE Reflexed from [id number] Performed at [facility], [address] [place], [place] [address]    Culture (A)  Final    60,000 COLONIES/mL ENTEROCOCCUS FAECALIS VANCOMYCIN  RESISTANT ENTEROCOCCUS 10,000 COLONIES/mL ESCHERICHIA COLI    Report Status 05/31/2022 FINAL  Final   Organism ID, Bacteria ENTEROCOCCUS FAECALIS (A)  Final   Organism ID, Bacteria ESCHERICHIA COLI (A)  Final      Susceptibility   Escherichia coli - MIC*    AMPICILLIN >=32 RESISTANT Resistant     CEFAZOLIN  <=4 SENSITIVE Sensitive     CEFEPIME  <=0.12 SENSITIVE Sensitive     CEFTRIAXONE  <=0.25 SENSITIVE Sensitive     CIPROFLOXACIN <=0.25 SENSITIVE Sensitive     GENTAMICIN <=1 SENSITIVE Sensitive     IMIPENEM <=0.25 SENSITIVE Sensitive     NITROFURANTOIN <=16 SENSITIVE Sensitive     TRIMETH /SULFA  >=320 RESISTANT Resistant     AMPICILLIN/SULBACTAM 16 INTERMEDIATE Intermediate     PIP/TAZO <=4 SENSITIVE Sensitive     * 10,000 COLONIES/mL ESCHERICHIA COLI   Enterococcus faecalis - MIC*    AMPICILLIN <=2 SENSITIVE Sensitive     NITROFURANTOIN <=16 SENSITIVE Sensitive     VANCOMYCIN  >=32 RESISTANT Resistant     * 60,000 COLONIES/mL ENTEROCOCCUS FAECALIS  Respiratory (~20 pathogens) panel by PCR     Status: None   Collection Time: 05/28/22  8:02 AM   Specimen: Nasopharyngeal Swab; Respiratory  Result Value Ref Range Status   Adenovirus NOT DETECTED NOT DETECTED Final   Coronavirus 229E NOT DETECTED NOT DETECTED Final    Comment: (NOTE) The Coronavirus on the Respiratory Panel, DOES NOT test for the novel  Coronavirus (2019 nCoV)    Coronavirus HKU1 NOT DETECTED NOT DETECTED Final   Coronavirus NL63 NOT DETECTED NOT DETECTED Final   Coronavirus OC43 NOT DETECTED NOT DETECTED Final   Metapneumovirus NOT DETECTED NOT DETECTED  Final   Rhinovirus / Enterovirus NOT DETECTED NOT DETECTED Final   Influenza A NOT DETECTED NOT DETECTED Final   Influenza B NOT DETECTED NOT DETECTED Final   Parainfluenza Virus 1 NOT DETECTED NOT DETECTED Final   Parainfluenza Virus 2 NOT DETECTED NOT DETECTED Final   Parainfluenza Virus 3 NOT DETECTED NOT DETECTED Final   Parainfluenza Virus 4 NOT DETECTED NOT DETECTED Final   Respiratory Syncytial Virus NOT DETECTED NOT DETECTED Final   Bordetella pertussis NOT DETECTED NOT DETECTED Final   Bordetella Parapertussis NOT DETECTED NOT DETECTED Final   Chlamydophila pneumoniae NOT DETECTED NOT DETECTED Final   Mycoplasma pneumoniae NOT DETECTED NOT DETECTED Final    Comment: Performed at [facility], [address] [place], [place] [address]  Group A Strep by PCR     Status: None   Collection Time: 05/28/22  8:04 AM   Specimen: Throat; Sterile Swab  Result Value Ref Range Status   Group A Strep by PCR NOT DETECTED NOT DETECTED Final    Comment: Performed at [facility], [address], [place], [place] [address]  Group B strep by PCR     Status: None   Collection Time: 05/28/22  8:04 AM   Specimen: Throat; Genital  Result Value Ref Range Status   Group B strep by PCR NEGATIVE NEGATIVE Final  Comment: (NOTE) Intrapartum testing with Xpert GBS assay should be used as an adjunct to other methods available and not used to replace antepartum testing (at 35-[redacted] weeks gestation). Performed at [facility], [address], [place], [place] [address]   Vbg ordered   __________________________________________________________ Recent Labs  Lab 10/21/23 2020 10/21/23 2024  NA 135 134*  K 4.5 4.4  CO2  --  32  GLUCOSE 334* 339*  BUN 52* 49*  CREATININE 1.30* 1.29*  CALCIUM   --  8.9    Cr  stable,    Lab Results  Component Value Date   CREATININE 1.29 (H) 10/21/2023   CREATININE 1.30 (H) 10/21/2023   CREATININE 1.06 (H) 05/31/2022    Recent Labs  Lab  10/21/23 2024  AST 63*  ALT 52*  ALKPHOS 115  BILITOT 0.8  PROT 7.1  ALBUMIN 3.4*   Lab Results  Component Value Date   CALCIUM  8.9 10/21/2023   PHOS 2.9 04/20/2020    Plt: Lab Results  Component Value Date   PLT 189 10/21/2023       Recent Labs  Lab 10/21/23 2020 10/21/23 2024  WBC  --  6.1  HGB 11.6* 10.4*  HCT 34.0* 31.9*  MCV  --  107.4*  PLT  --  189    HG/HCT  stable,      Component Value Date/Time   HGB 10.4 (L) 10/21/2023 2024   HGB 12.2 05/21/2022 1231   HCT 31.9 (L) 10/21/2023 2024   HCT 36.0 05/21/2022 1231   MCV 107.4 (H) 10/21/2023 2024   MCV 106 (H) 05/21/2022 1231    _______________________________________________ Hospitalist was called for admission for   Fall  Displaced intertrochanteric fracture of right femur,      The following Work up has been ordered so far:  Orders Placed This Encounter  Procedures   DG Chest Port 1 View   CT HEAD WO CONTRAST   CT CERVICAL SPINE WO CONTRAST   CT CHEST ABDOMEN PELVIS WO CONTRAST   DG Pelvis Portable   Comprehensive metabolic panel   CBC   Ethanol   Urinalysis, Routine w reflex microscopic -Urine, Clean Catch   Protime-INR   Diet NPO time specified   ED Cardiac monitoring   Measure blood pressure   Initiate Carrier Fluid Protocol   Consult to orthopedic surgery   Consult to hospitalist   ED Pulse oximetry, continuous   I-Stat Chem 8, ED   I-Stat Lactic Acid, ED   ED EKG   Type and screen [place] [facility]     OTHER Significant initial  Findings:  labs showing:     DM  labs:  HbA1C: No results for input(s): HGBA1C in the last 8760 hours.     CBG (last 3)  No results for input(s): GLUCAP in the last 72 hours.        Cultures:    Component Value Date/Time   SDES URINE, RANDOM 05/28/2022 0312   SPECREQUEST  05/28/2022 0312    NONE Reflexed from [id number] Performed at [facility], [address], [place], [place] [address]    CULT (A) 05/28/2022 0312     60,000 COLONIES/mL ENTEROCOCCUS FAECALIS VANCOMYCIN  RESISTANT ENTEROCOCCUS 10,000 COLONIES/mL ESCHERICHIA COLI    REPTSTATUS 05/31/2022 FINAL 05/28/2022 [id number]     Radiological Exams on Admission: CT CHEST ABDOMEN PELVIS WO CONTRAST Result Date: 10/21/2023 EXAM: CT CHEST, ABDOMEN AND PELVIS WITHOUT CONTRAST 10/21/2023 08:40:45 PM TECHNIQUE: CT of the chest, abdomen and  pelvis was performed without the administration of intravenous contrast. Multiplanar reformatted images are provided for review. Automated exposure control, iterative reconstruction, and/or weight based adjustment of the mA/kV was utilized to reduce the radiation dose to as low as reasonably achievable. COMPARISON: CT abdomen and pelvis 05/28/2022 and CT angiogram chest 12/21/2017. CLINICAL HISTORY: Polytrauma, blunt. FINDINGS: CHEST: MEDIASTINUM AND LYMPH NODES: Heart is mildly enlarged. There are atherosclerotic calcifications of the aorta and coronary arteries. Patient is status post cardiac surgery. Sternotomy wires are present. The central airways are clear. No mediastinal, hilar or axillary lymphadenopathy. There is a moderate-sized hiatal hernia and patulous esophagus. LUNGS AND PLEURA: There is a small amount of right lower lobe airspace disease. No pleural effusion or pneumothorax. ABDOMEN AND PELVIS: LIVER: The liver is unremarkable. GALLBLADDER AND BILE DUCTS: Gallbladder is unremarkable. No biliary ductal dilatation. SPLEEN: No acute abnormality. PANCREAS: No acute abnormality. ADRENAL GLANDS: No acute abnormality. KIDNEYS, URETERS AND BLADDER: There is a 3 cm cyst in the inferior pole of the left kidney, unchanged. Per consensus, no follow-up is needed for simple Bosniak type 1 and 2 renal cysts, unless the patient has a malignancy history or risk factors. No stones in the kidneys or ureters. No hydronephrosis. No perinephric or periureteral stranding. Urinary bladder is unremarkable. GI AND BOWEL: Stomach demonstrates no acute  abnormality. Appendix is not visualized. There is no bowel obstruction. REPRODUCTIVE ORGANS: No acute abnormality. PERITONEUM AND RETROPERITONEUM: No ascites. No free air. There is a small fat-containing umbilical hernia. VASCULATURE: Aorta is normal in caliber. There are atherosclerotic calcifications of the aorta and iliac arteries. ABDOMINAL AND PELVIS LYMPH NODES: No lymphadenopathy. BONES AND SOFT TISSUES: There are degenerative changes throughout the spine. L4-L5 lumbar fusion hardware present. There is a comminuted right femoral intertrochanteric fracture with superolateral displacement of the distal fracture fragment. No focal soft tissue abnormality. IMPRESSION: 1. Comminuted right intertrochanteric femur fracture with superolateral displacement of the distal fragment. 2. Small right lower lobe airspace disease. No pleural effusion or pneumothorax. 3. Moderate hiatal hernia. Electronically signed by: Greig Pique MD 10/21/2023 09:00 PM EDT RP Workstation: [id number]   CT HEAD WO CONTRAST Result Date: 10/21/2023 EXAM: CT HEAD AND CERVICAL SPINE 10/21/2023 08:40:45 PM TECHNIQUE: CT of the head and cervical spine was performed without the administration of intravenous contrast. Multiplanar reformatted images are provided for review. Automated exposure control, iterative reconstruction, and/or weight based adjustment of the mA/kV was utilized to reduce the radiation dose to as low as reasonably achievable. COMPARISON: MRI brain dated 08/28/2022. CLINICAL HISTORY: Head trauma, moderate-severe. FINDINGS: CT HEAD BRAIN AND VENTRICLES: No acute intracranial hemorrhage. No mass effect or midline shift. No abnormal extra-axial fluid collection. No evidence of acute infarct. No hydrocephalus. Subcortical and periventricular small vessel ischemic changes. ORBITS: No acute abnormality. SINUSES AND MASTOIDS: No acute abnormality. SOFT TISSUES AND SKULL: No acute skull fracture. No acute soft tissue abnormality. CT  CERVICAL SPINE BONES AND ALIGNMENT: No acute fracture or traumatic malalignment. 4 mm anterolisthesis of C7 on T1. DEGENERATIVE CHANGES: Mild degenerative changes of the mid to lower cervical spine. SOFT TISSUES: No prevertebral soft tissue swelling. IMPRESSION: 1. No acute intracranial abnormality. 2. No acute traumatic injury to the cervical spine. Electronically signed by: Pinkie Pebbles MD 10/21/2023 08:58 PM EDT RP Workstation: [id number]   CT CERVICAL SPINE WO CONTRAST Result Date: 10/21/2023 EXAM: CT HEAD AND CERVICAL SPINE 10/21/2023 08:40:45 PM TECHNIQUE: CT of the head and cervical spine was performed without the administration of intravenous contrast. Multiplanar reformatted images are  provided for review. Automated exposure control, iterative reconstruction, and/or weight based adjustment of the mA/kV was utilized to reduce the radiation dose to as low as reasonably achievable. COMPARISON: MRI brain dated 08/28/2022. CLINICAL HISTORY: Head trauma, moderate-severe. FINDINGS: CT HEAD BRAIN AND VENTRICLES: No acute intracranial hemorrhage. No mass effect or midline shift. No abnormal extra-axial fluid collection. No evidence of acute infarct. No hydrocephalus. Subcortical and periventricular small vessel ischemic changes. ORBITS: No acute abnormality. SINUSES AND MASTOIDS: No acute abnormality. SOFT TISSUES AND SKULL: No acute skull fracture. No acute soft tissue abnormality. CT CERVICAL SPINE BONES AND ALIGNMENT: No acute fracture or traumatic malalignment. 4 mm anterolisthesis of C7 on T1. DEGENERATIVE CHANGES: Mild degenerative changes of the mid to lower cervical spine. SOFT TISSUES: No prevertebral soft tissue swelling. IMPRESSION: 1. No acute intracranial abnormality. 2. No acute traumatic injury to the cervical spine. Electronically signed by: Pinkie Pebbles MD 10/21/2023 08:58 PM EDT RP Workstation: [id number]   DG Pelvis Portable Result Date: 10/21/2023 EXAM: 1 or 2 VIEW(S) XRAY OF THE  PELVIS 10/21/2023 08:18:00 PM COMPARISON: None available. CLINICAL HISTORY: Fall. FINDINGS: BONES AND JOINTS: Acute comminuted and displaced right intertrochanteric fracture. No right hip dislocation. No acute displaced fracture or dislocation of the left hip. No acute displaced fracture or diastasis of the bones of the pelvis. Degenerative changes of the visualized lower lumbar spine. L5-S1 surgical hardware noted. Sacrum is grossly unremarkable - limited evaluation due to overlapping osseous structures and overlying soft tissues. SOFT TISSUES: The soft tissues are unremarkable. IMPRESSION: 1. Acute comminuted and displaced right intertrochanteric fracture. Electronically signed by: Morgane Naveau MD 10/21/2023 08:31 PM EDT RP Workstation: [id number]   DG Chest Port 1 View Result Date: 10/21/2023 EXAM: 1 VIEW XRAY OF THE CHEST 10/21/2023 08:18:00 PM COMPARISON: CT chest dated 12/21/2017. CLINICAL HISTORY: Trauma. fall. FINDINGS: LUNGS AND PLEURA: Chronic coarsened interstitial markings with no overt pulmonary edema. No focal consolidation. No pleural effusion. No pneumothorax. HEART AND MEDIASTINUM: Unchanged cardiomediastinal silhouette. Atherosclerotic plaque. Surgical changes overlies the mediastinum. BONES AND SOFT TISSUES: Intact sternotomy wires. No acute osseous abnormality. IMPRESSION: 1. No acute cardiopulmonary process identified. Electronically signed by: Kate Plummer MD 10/21/2023 08:29 PM EDT RP Workstation: [id number]   _______________________________________________________________________________________________________ Latest  Blood pressure 114/76, pulse (!) 59, temperature 98.1 F (36.7 C), resp. rate 13, height 5' 2 (1.575 m), weight 84.1 kg, SpO2 99%.   Vitals  labs and radiology finding personally reviewed  Review of Systems:    Pertinent positives include:  fall right hip pain  Constitutional:  No weight loss, night sweats, Fevers, chills, fatigue, weight loss  HEENT:  No  headaches, Difficulty swallowing,Tooth/dental problems,Sore throat,  No sneezing, itching, ear ache, nasal congestion, post nasal drip,  Cardio-vascular:  No chest pain, Orthopnea, PND, anasarca, dizziness, palpitations.no Bilateral lower extremity swelling  GI:  No heartburn, indigestion, abdominal pain, nausea, vomiting, diarrhea, change in bowel habits, loss of appetite, melena, blood in stool, hematemesis Resp:  no shortness of breath at rest. No dyspnea on exertion, No excess mucus, no productive cough, No non-productive cough, No coughing up of blood.No change in color of mucus.No wheezing. Skin:  no rash or lesions. No jaundice GU:  no dysuria, change in color of urine, no urgency or frequency. No straining to urinate.  No flank pain.  Musculoskeletal:  No joint pain or no joint swelling. No decreased range of motion. No back pain.  Psych:  No change in mood or affect. No depression or anxiety. No memory loss.  Neuro:  no localizing neurological complaints, no tingling, no weakness, no double vision, no gait abnormality, no slurred speech, no confusion  All systems reviewed and apart from HOPI all are negative _______________________________________________________________________________________________ Past Medical History:   Past Medical History:  Diagnosis Date   Acute kidney injury 08/05/2016   Aftercare following surgery of the circulatory system, NEC 05/11/2013   AKI (acute kidney injury) 08/04/2016   Anxiety    takes Celexa  daily   ARF (acute renal failure) 11/07/2016   Asymptomatic stenosis of right carotid artery 03/22/2016   Back pain    occasionally   Carotid artery disease 04/16/2013   Carotid artery occlusion    Carotid stenosis 11/16/2013   Cataract    left and immature   Coronary artery disease    Coronary atherosclerosis of native coronary artery 03/11/2013   S/p CABG in 1997    Depression    Diabetes mellitus    takes Metformin  and Glipizide  daily    Diabetes mellitus (HCC) 05/02/2015   Dizziness    takes Meclizine  daily as needed   Essential hypertension, benign 03/11/2013   GERD (gastroesophageal reflux disease)    takes Omeprazole daily as needed   Headache(784.0)    Hyperlipidemia    takes Atorvastatin  daily   Hypertension    takes Carvedilol  daily   Mixed hyperlipidemia 03/11/2013   Muscle spasm    takes Robaxin  daily as needed   Nausea    takes Phenergan  daily as needed   Occlusion and stenosis of carotid artery without mention of cerebral infarction 07/19/2011   Pneumonia    hx of-in high school   Restless leg    takes Requip  daily as needed   Seasonal allergies    takes Claritin  daily as needed and Afrin as needed   Shortness of breath    with exertion   Urinary urgency    UTI (urinary tract infection) 11/07/2016      Past Surgical History:  Procedure Laterality Date   COLONOSCOPY WITH PROPOFOL  N/A 03/10/2012   Procedure: COLONOSCOPY WITH PROPOFOL ;  Surgeon: Gladis MARLA Louder, MD;  Location: [facility];  Service: Endoscopy;  Laterality: N/A;   CORNEAL TRANSPLANT Right    CORONARY ARTERY BYPASS GRAFT  1997   x 6   CORONARY ARTERY BYPASS GRAFT  Jan. 1997   ENDARTERECTOMY Left 04/16/2013   Procedure: Left Carotid Artery Endatarectomy with Resection of Redundant Internal Carotid Artery;  Surgeon: Krystal JULIANNA Doing, MD;  Location[facility] OR;  Service: Vascular;  Laterality: Left;   ENDARTERECTOMY Right 03/22/2016   Procedure: RIGHT ENDARTERECTOMY CAROTID;  Surgeon: Krystal JULIANNA Doing, MD;  Location: [facility] OR;  Service: Vascular;  Laterality: Right;   ESOPHAGOGASTRODUODENOSCOPY N/A 03/10/2012   Procedure: ESOPHAGOGASTRODUODENOSCOPY (EGD);  Surgeon: Gladis MARLA Louder, MD;  Location: [facility];  Service: Endoscopy;  Laterality: N/A;   EYE SURGERY  March 12, 2001   CORNEA TRANSPLANT Right eye   PATCH ANGIOPLASTY Right 03/22/2016   Procedure: PATCH ANGIOPLASTY;  Surgeon: Krystal JULIANNA Doing, MD;  Location: [facility] OR;  Service: Vascular;  Laterality: Right;    PR VEIN BYPASS GRAFT,AORTO-FEM-POP  1997   SPINE SURGERY  march 2013   Back surgery   TONSILLECTOMY     TRIGGER FINGER RELEASE Left    thumb    Social History:  Ambulatory  , walker  wheelchair bound,      reports that she has never smoked. She has never used smokeless tobacco. She reports that she does not drink alcohol  and does not use drugs.  Family History:   Family History  Problem Relation Age of Onset   Heart attack Father    Heart disease Father 12       Before age of 65   Hyperlipidemia Father    Heart disease Brother        Heart dissease before age 68   Hyperlipidemia Brother    Cirrhosis Mother    Heart attack Paternal Grandfather    Heart disease Paternal Grandfather    Cancer Maternal Grandmother    Alcoholism Maternal Grandfather    Heart attack Paternal Grandmother    ______________________________________________________________________________________________ Allergies: Allergies  Allergen Reactions   Latex Other (See Comments), Rash and Hives    tears skin   Codeine Other (See Comments)    Abnormal behavior   Morphine  And Codeine Other (See Comments)    Affects BP and blood sugar.   Cyclobenzaprine Other (See Comments)    MADE PT SICK- pt unsure if med was cyclobenzaprine or methocarbamol    Methocarbamol  Other (See Comments)    MADE PT SICK- pt unsure if med was cyclobenzaprine or methocarbamol    11/09/20 pt currently takes methocarbamol   MADE PT SICK- pt unsure if med was cyclobenzaprine or methocarbamol   11/09/20 pt currently takes methocarbamol    Morphine  Other (See Comments)    Unknown reaction     Prior to Admission medications   Medication Sig Start Date End Date Taking? Authorizing Provider  acetaminophen  (TYLENOL ) 500 MG tablet Take 1,000 mg by mouth at bedtime.    [provider]  amLODipine  (NORVASC ) 10 MG tablet Take 10 mg by mouth daily.    [provider]  aspirin  EC 81 MG tablet Take 81 mg by mouth  every evening.     [provider]  busPIRone  (BUSPAR ) 10 MG tablet Take 10 mg by mouth 2 (two) times daily. 01/25/23   [provider]  calcium  carbonate (TUMS - DOSED IN MG ELEMENTAL CALCIUM ) 500 MG chewable tablet Chew 1 tablet by mouth 3 (three) times daily with meals.    [provider]  carvedilol  (COREG ) 12.5 MG tablet Take 1 tablet (12.5 mg total) by mouth 2 (two) times daily. 06/28/20 08/27/23  Rojelio Nest, DO  citalopram  (CELEXA ) 20 MG tablet Take 20 mg by mouth daily.    [provider]  diphenhydrAMINE -zinc acetate (BENADRYL ) cream Apply 1 Application topically daily as needed for itching.    [provider]  docusate sodium  (COLACE) 100 MG capsule Take 100 mg by mouth 2 (two) times daily.    [provider]  empagliflozin (JARDIANCE) 25 MG TABS tablet Take 25 mg by mouth daily.    [provider]  estradiol (ESTRACE) 0.1 MG/GM vaginal cream Place 1 Applicatorful vaginally at bedtime. Twice daily for pain    [provider]  ferrous sulfate  325 (65 FE) MG tablet Take 325 mg by mouth daily. Monday-Friday    [provider]  fluticasone (CUTIVATE) 0.005 % ointment Apply 1 Application topically as needed.    [provider]  furosemide  (LASIX ) 20 MG tablet Take 1 tablet (20 mg) daily for 3 days, then take once daily as needed for weight gain of 2-3 lbs overnight or 5 lbs in 1 week. Patient taking differently: Take 20 mg by mouth 2 (two) times daily. Take 1 tablet (20 mg) daily for 3 days, then take once daily as needed for weight gain of 2-3 lbs overnight or 5 lbs in 1 week. 02/04/23   Parthenia Olivia HERO, PA-C  gabapentin  (NEURONTIN ) 300 MG capsule Take 300 mg by mouth 3 (three) times daily. 01/25/23   [provider]  glipiZIDE  (GLUCOTROL  XL) 5 MG 24 hr tablet Take 1 tablet (5 mg total) by mouth daily with breakfast. 04/21/20   Milissa Tod PARAS, MD  HYDROcodone -acetaminophen  (NORCO) 10-325 MG tablet  Take 1 tablet by mouth in the morning and at bedtime. Patient not taking: Reported on 08/27/2023    [provider]  HYDROcodone -acetaminophen  (NORCO/VICODIN) 5-325 MG tablet Take 1 tablet by mouth daily. At 1pm Patient not taking: Reported on 08/27/2023    [provider]  hydrOXYzine  (ATARAX ) 25 MG tablet Take 25 mg by mouth every 6 (six) hours as needed.    [provider]  hyoscyamine  (LEVSIN  SL) 0.125 MG SL tablet Place 1 tablet (0.125 mg total) under the tongue 3 (three) times daily with meals as needed. 07/30/23   May, Deanna J, NP  levocetirizine (XYZAL) 5 MG tablet Take 5 mg by mouth every evening.    [provider]  magnesium  oxide (MAG-OX) 400 (240 Mg) MG tablet Take 400 mg by mouth at bedtime.    [provider]  meclizine  (ANTIVERT ) 25 MG tablet Take 25 mg by mouth 3 (three) times daily.    [provider]  melatonin 3 MG TABS tablet Take 3 mg by mouth at bedtime.    [provider]  methocarbamol  (ROBAXIN ) 750 MG tablet Take 750 mg by mouth 2 (two) times daily. 10/31/22   [provider]  mupirocin  ointment (BACTROBAN ) 2 % Apply 1 Application topically as needed.    [provider]  nystatin powder Apply 1 Application topically 2 (two) times daily.    [provider]  oxybutynin  (DITROPAN -XL) 10 MG 24 hr tablet Take 10 mg by mouth daily.    [provider]  OXYGEN  Inhale 2 L/min into the lungs continuous.    [provider]  pantoprazole  (PROTONIX ) 40 MG tablet Take 40 mg by mouth daily.    [provider]  pantoprazole  (PROTONIX ) 40 MG tablet Take 1 tablet (40 mg total) by mouth 2 (two) times daily. 07/14/23   May, Deanna J, NP  rosuvastatin  (CRESTOR ) 10 MG tablet Take 10 mg by mouth daily.    [provider]  saccharomyces boulardii (FLORASTOR) 250 MG capsule Take 250 mg by mouth daily.    [provider]  senna (SENOKOT) 8.6 MG tablet Take 2 tablets by  mouth at bedtime.    [provider]  sodium zirconium cyclosilicate  (LOKELMA ) 10 g PACK packet Take 10 g by mouth daily.    [provider]  ticagrelor  (BRILINTA ) 90 MG TABS tablet Take 1 tablet (90 mg total) by mouth 2 (two) times daily. 04/20/20   Milissa Tod PARAS, MD  valsartan (DIOVAN) 80 MG tablet Take 80 mg by mouth daily. 12/03/21   [provider]    ___________________________________________________________________________________________________ Physical Exam:    10/21/2023    8:40 PM 10/21/2023    8:39 PM 10/21/2023    8:13 PM  Vitals with BMI  Height   5' 2  Weight  185 lbs 7 oz 178 lbs  BMI  33.9 32.55  Systolic 114    Diastolic 76    Pulse 59       1. General:  in  cute distress complaining of severe pain    Chronically ill  -appearing 2. Psychological: Alert and   Oriented 3. Head/ENT:   Dry Mucous Membranes  Head Non traumatic, neck supple                        Poor Dentition 4. SKIN:  decreased Skin turgor,  Skin clean Dry and intact no rash    5. Heart: Regular rate and rhythm no  Murmur, no Rub or gallop 6. Lungs:   no wheezes or crackles   7. Abdomen: Soft,  non-tender, Non distended bowel sounds present 8. Lower extremities: no clubbing, cyanosis, no  edema 9. Neurologically Grossly intact, moving all 4 extremities equal   10. MSK: Normal range of motion limited in right leg    Chart has been reviewed  ______________________________________________________________________________________________  Assessment/Plan 83 y.o. female with medical history significant of Dm2.  Carotid artery stenosis, CAD status post CABG in 1997, depression hypertension GERD HLD restless leg, chronic diastolic CHF multiple falls, hx of stroke  Admitted for Fall  Displaced intertrochanteric fracture of right femur,     Present on Admission:  Closed right hip fracture (HCC)  Carotid artery disease  Chronic diastolic  CHF (congestive heart failure) (HCC)  Controlled type 2 diabetes mellitus with hyperglycemia (HCC)  Chronic respiratory failure with hypoxia (HCC)  Coronary artery disease  Essential hypertension  HLD (hyperlipidemia)  hx of CVA (cerebral vascular accident) (HCC)  Iron deficiency anemia     Carotid artery disease On aspirin  Coreg  Crestor   Chronic diastolic CHF (congestive heart failure) (HCC) Chronic systolic diastolic CHF current appears to be euvolemic  Closed right hip fracture (HCC)  - management as per orthopedics,      Keep nothing by mouth post midnight. Patient   not on anticoagulation but antiplatelet agents   on hold Ordered type and screen,  order a vitamin D level  Patient at baseline  unable to walk a flight of stairs or 100 feet   due to  generalize fatigue and deconditioning   shortness of breath.   * Patient denies any chest pain or shortness of breath currently    ECG showing no evidence of acute ischemia  known history of coronary artery disease COPD   CKD  Given advanced age patient is at least moderate  risk    will order echo  CE   Cardiology consult for further pre-op clearance given extensive cardiac disease anesthesia should be aware of patient pulmonary status    Controlled type 2 diabetes mellitus with hyperglycemia (HCC)  - Order Sensitive SSI    -  check TSH and HgA1C  - Hold by mouth medications    Chronic respiratory failure with hypoxia (HCC) Likely in setting of COPD continue oxygen   Coronary artery disease May appreciate cardiology evaluation prior to or as patient has diminished exercise capacity  Essential hypertension Somewhat soft blood pressures will allow permissive hypertension for tonight  HLD (hyperlipidemia) Chronic stable continue Crestor  10 mg a day  hx of CVA (cerebral vascular accident) (HCC) Hold aspirin  and Brilinta   Iron deficiency anemia Obtain anemia panel  Transfuse for Hg <7 , rapidly dropping or  if  symptomatic  Imaging did show questionable infiltrate patient denies any coughing or shortness of breath no fever no white blood cell count elevation we will continue to observe for now hold off on antibiotics for tonight  Other plan as per orders.  DVT prophylaxis:  SCD      Code Status:    Code Status: Prior FULL CODE  as per patient   I had personally discussed CODE STATUS with patient  ACP   none     Family Communication:   Family not at  Bedside    Diet  Diet Orders (From admission, onward)     Start     Ordered   10/21/23 2006  Diet NPO time specified  Diet effective now        10/21/23 2006            Disposition Plan:     likely will need placement for rehabilitation                            Following barriers for discharge:                                                       Electrolytes corrected                                                             Pain controlled with PO medications                             Hip fracture repair                           Will need consultants to evaluate patient prior to discharge                           Consult Orders  (From admission, onward)           Start     Ordered   10/21/23 2116  Consult to hospitalist  Pg by chloe 21:21  Once       Provider:  (Not yet assigned)  Question Answer Comment  Place call to: [facility]   Reason for Consult Admit      10/21/23 2115                              Transition of care consulted  Consults called: orthopedics sent msg to cardiology   Admission status:  ED Disposition     ED Disposition  Admit   Condition  --   Comment  Hospital Area: [facility] [[id number]]  Level of Care: Telemetry Medical [[id number]]  May admit patient to [facility] or [facility] if equivalent level of care is available:: No  Diagnosis: Closed right hip fracture [facility]) [[id number]]  Admitting Physician: Milanie Rosenfield [[id number]]  Attending Physician:  Preslea Rhodus [[id number]]  Certification:: I certify this patient will need inpatient services for at least 2 midnights  Expected Medical Readiness: 10/23/2023           inpatient     I Expect 2 midnight stay secondary to severity of patient's current illness need for inpatient interventions justified by the following:     Severe lab/radiological/exam abnormalities including:    Fall   Displaced intertrochanteric fracture    and extensive comorbidities including:    DM2  CHF  CAD  COPD/asthma    history of stroke with residual deficits    That are currently affecting medical management.   I expect  patient to be hospitalized for 2 midnights requiring inpatient medical care.  Patient is at high risk for adverse outcome (such as loss of life or disability) if not treated.  Indication for inpatient stay as follows:    severe pain requiring acute inpatient management,    Need for operative/procedural  intervention    Need for   IV fluids,, IV pain medications,     Level of care     tele  For 12H   Thank that lady echo  Keyandre Pileggi 10/21/2023, 10:55 PM    [facility]     after 2 AM please page floor coverage   If 7AM-7PM, please contact the day team taking care of the patient using [email or web address]

## 2023-10-21 NOTE — ED Provider Notes (Signed)
 [place] EMERGENCY DEPARTMENT AT [facility] Provider Note   CSN: [id number] Arrival date & time: 10/21/23  2003     Patient presents with: Andrea Kirk is a 83 y.o. female.   HPI Patient presents after a fall.  Medical history includes HTN, HLD, anxiety, DM, GERD, CAD, anemia.  She had a mechanical fall at her nursing facility.  It was described as being caused by a trip.  She landed on her right side.  She did strike her head.  Since her fall, patient has had pain in area of her right hip.  EMS noted right leg shortening.  She has a small wound on the lateral aspect of her right periorbital area.  She is on aspirin  and Brilinta .  She did receive 150 mcg of fentanyl  prior to arrival.  At baseline, she wears 3 L of oxygen .    Prior to Admission medications   Medication Sig Start Date End Date Taking? Authorizing Provider  acetaminophen  (TYLENOL ) 500 MG tablet Take 1,000 mg by mouth at bedtime.    [provider]  amLODipine  (NORVASC ) 10 MG tablet Take 10 mg by mouth daily.    [provider]  aspirin  EC 81 MG tablet Take 81 mg by mouth every evening.     [provider]  busPIRone  (BUSPAR ) 10 MG tablet Take 10 mg by mouth 2 (two) times daily. 01/25/23   [provider]  calcium  carbonate (TUMS - DOSED IN MG ELEMENTAL CALCIUM ) 500 MG chewable tablet Chew 1 tablet by mouth 3 (three) times daily with meals.    [provider]  carvedilol  (COREG ) 12.5 MG tablet Take 1 tablet (12.5 mg total) by mouth 2 (two) times daily. 06/28/20 08/27/23  Rojelio Nest, DO  citalopram  (CELEXA ) 20 MG tablet Take 20 mg by mouth daily.    [provider]  diphenhydrAMINE -zinc acetate (BENADRYL ) cream Apply 1 Application topically daily as needed for itching.    [provider]  docusate sodium  (COLACE) 100 MG capsule Take 100 mg by mouth 2 (two) times daily.    [provider]  empagliflozin (JARDIANCE) 25 MG TABS tablet  Take 25 mg by mouth daily.    [provider]  estradiol (ESTRACE) 0.1 MG/GM vaginal cream Place 1 Applicatorful vaginally at bedtime. Twice daily for pain    [provider]  ferrous sulfate  325 (65 FE) MG tablet Take 325 mg by mouth daily. Monday-Friday    [provider]  fluticasone (CUTIVATE) 0.005 % ointment Apply 1 Application topically as needed.    [provider]  furosemide  (LASIX ) 20 MG tablet Take 1 tablet (20 mg) daily for 3 days, then take once daily as needed for weight gain of 2-3 lbs overnight or 5 lbs in 1 week. Patient taking differently: Take 20 mg by mouth 2 (two) times daily. Take 1 tablet (20 mg) daily for 3 days, then take once daily as needed for weight gain of 2-3 lbs overnight or 5 lbs in 1 week. 02/04/23   Parthenia Olivia HERO, PA-C  gabapentin  (NEURONTIN ) 300 MG capsule Take 300 mg by mouth 3 (three) times daily. 01/25/23   [provider]  glipiZIDE  (GLUCOTROL  XL) 5 MG 24 hr tablet Take 1 tablet (5 mg total) by mouth daily with breakfast. 04/21/20   Milissa Tod PARAS, MD  HYDROcodone -acetaminophen  (NORCO) 10-325 MG tablet Take 1 tablet by mouth in the morning and at bedtime. Patient not taking: Reported on 08/27/2023  [provider]  HYDROcodone -acetaminophen  (NORCO/VICODIN) 5-325 MG tablet Take 1 tablet by mouth daily. At 1pm Patient not taking: Reported on 08/27/2023    [provider]  hydrOXYzine  (ATARAX ) 25 MG tablet Take 25 mg by mouth every 6 (six) hours as needed.    [provider]  hyoscyamine  (LEVSIN  SL) 0.125 MG SL tablet Place 1 tablet (0.125 mg total) under the tongue 3 (three) times daily with meals as needed. 07/30/23   May, Deanna J, NP  levocetirizine (XYZAL) 5 MG tablet Take 5 mg by mouth every evening.    [provider]  magnesium  oxide (MAG-OX) 400 (240 Mg) MG tablet Take 400 mg by mouth at bedtime.    [provider]  meclizine  (ANTIVERT ) 25 MG tablet Take 25 mg by  mouth 3 (three) times daily.    [provider]  melatonin 3 MG TABS tablet Take 3 mg by mouth at bedtime.    [provider]  methocarbamol  (ROBAXIN ) 750 MG tablet Take 750 mg by mouth 2 (two) times daily. 10/31/22   [provider]  mupirocin  ointment (BACTROBAN ) 2 % Apply 1 Application topically as needed.    [provider]  nystatin powder Apply 1 Application topically 2 (two) times daily.    [provider]  oxybutynin  (DITROPAN -XL) 10 MG 24 hr tablet Take 10 mg by mouth daily.    [provider]  OXYGEN  Inhale 2 L/min into the lungs continuous.    [provider]  pantoprazole  (PROTONIX ) 40 MG tablet Take 40 mg by mouth daily.    [provider]  pantoprazole  (PROTONIX ) 40 MG tablet Take 1 tablet (40 mg total) by mouth 2 (two) times daily. 07/14/23   May, Deanna J, NP  rosuvastatin  (CRESTOR ) 10 MG tablet Take 10 mg by mouth daily.    [provider]  saccharomyces boulardii (FLORASTOR) 250 MG capsule Take 250 mg by mouth daily.    [provider]  senna (SENOKOT) 8.6 MG tablet Take 2 tablets by mouth at bedtime.    [provider]  sodium zirconium cyclosilicate  (LOKELMA ) 10 g PACK packet Take 10 g by mouth daily.    [provider]  ticagrelor  (BRILINTA ) 90 MG TABS tablet Take 1 tablet (90 mg total) by mouth 2 (two) times daily. 04/20/20   Milissa Tod PARAS, MD  valsartan (DIOVAN) 80 MG tablet Take 80 mg by mouth daily. 12/03/21   [provider]    Allergies: Latex, Codeine, Morphine  and codeine, Cyclobenzaprine, Methocarbamol , and Morphine     Review of Systems  Musculoskeletal:  Positive for arthralgias.  Skin:  Positive for wound.  All other systems reviewed and are negative.   Updated Vital Signs BP 114/76   Pulse (!) 59   Temp 98.1 F (36.7 C)   Resp 13   Ht 5' 2 (1.575 m)   Wt 84.1 kg   SpO2 99%   BMI 33.91 kg/m   Physical Exam Vitals and nursing note  reviewed.  Constitutional:      General: She is not in acute distress.    Appearance: Normal appearance. She is well-developed. She is not ill-appearing, toxic-appearing or diaphoretic.  HENT:     Head: Normocephalic.     Comments: Bruising and small laceration to right temporal area.    Right Ear: External ear normal.     Left Ear: External ear normal.     Nose: Nose normal.     Mouth/Throat:     Mouth: Mucous  membranes are moist.  Eyes:     Extraocular Movements: Extraocular movements intact.     Conjunctiva/sclera: Conjunctivae normal.  Cardiovascular:     Rate and Rhythm: Normal rate and regular rhythm.     Heart sounds: No murmur heard. Pulmonary:     Effort: Pulmonary effort is normal. No respiratory distress.     Breath sounds: Normal breath sounds. No wheezing or rales.  Chest:     Chest wall: Tenderness present.  Abdominal:     General: There is no distension.     Palpations: Abdomen is soft.     Tenderness: There is no abdominal tenderness.  Musculoskeletal:        General: Deformity and signs of injury present. No swelling.     Cervical back: Normal range of motion and neck supple.  Skin:    General: Skin is warm and dry.     Coloration: Skin is not jaundiced or pale.  Neurological:     General: No focal deficit present.     Mental Status: She is alert and oriented to person, place, and time.  Psychiatric:        Mood and Affect: Mood normal.        Behavior: Behavior normal.     (all labs ordered are listed, but only abnormal results are displayed) Labs Reviewed  COMPREHENSIVE METABOLIC PANEL WITH GFR - Abnormal; Notable for the following components:      Result Value   Sodium 134 (*)    Chloride 92 (*)    Glucose, Bld 339 (*)    BUN 49 (*)    Creatinine, Ser 1.29 (*)    Albumin 3.4 (*)    AST 63 (*)    ALT 52 (*)    GFR, Estimated 41 (*)    All other components within normal limits  CBC - Abnormal; Notable for the following components:   RBC 2.97  (*)    Hemoglobin 10.4 (*)    HCT 31.9 (*)    MCV 107.4 (*)    MCH 35.0 (*)    All other components within normal limits  RETICULOCYTES - Abnormal; Notable for the following components:   RBC. 2.97 (*)    Immature Retic Fract 20.4 (*)    All other components within normal limits  I-STAT CHEM 8, ED - Abnormal; Notable for the following components:   Chloride 92 (*)    BUN 52 (*)    Creatinine, Ser 1.30 (*)    Glucose, Bld 334 (*)    TCO2 35 (*)    Hemoglobin 11.6 (*)    HCT 34.0 (*)    All other components within normal limits  ETHANOL  PROTIME-INR  URINALYSIS, ROUTINE W REFLEX MICROSCOPIC  VITAMIN B12  FOLATE  PROCALCITONIN  CK  MAGNESIUM   PHOSPHORUS  IRON AND TIBC  FERRITIN  HEMOGLOBIN A1C  BLOOD GAS, VENOUS  I-STAT CG4 LACTIC ACID, ED  TYPE AND SCREEN  TROPONIN I (HIGH SENSITIVITY)    EKG: None  Radiology: CT CHEST ABDOMEN PELVIS WO CONTRAST Result Date: 10/21/2023 EXAM: CT CHEST, ABDOMEN AND PELVIS WITHOUT CONTRAST 10/21/2023 08:40:45 PM TECHNIQUE: CT of the chest, abdomen and pelvis was performed without the administration of intravenous contrast. Multiplanar reformatted images are provided for review. Automated exposure control, iterative reconstruction, and/or weight based adjustment of the mA/kV was utilized to reduce the radiation dose to as low as reasonably achievable. COMPARISON: CT abdomen and pelvis 05/28/2022 and CT angiogram chest 12/21/2017. CLINICAL HISTORY: Polytrauma, blunt. FINDINGS:  CHEST: MEDIASTINUM AND LYMPH NODES: Heart is mildly enlarged. There are atherosclerotic calcifications of the aorta and coronary arteries. Patient is status post cardiac surgery. Sternotomy wires are present. The central airways are clear. No mediastinal, hilar or axillary lymphadenopathy. There is a moderate-sized hiatal hernia and patulous esophagus. LUNGS AND PLEURA: There is a small amount of right lower lobe airspace disease. No pleural effusion or pneumothorax. ABDOMEN  AND PELVIS: LIVER: The liver is unremarkable. GALLBLADDER AND BILE DUCTS: Gallbladder is unremarkable. No biliary ductal dilatation. SPLEEN: No acute abnormality. PANCREAS: No acute abnormality. ADRENAL GLANDS: No acute abnormality. KIDNEYS, URETERS AND BLADDER: There is a 3 cm cyst in the inferior pole of the left kidney, unchanged. Per consensus, no follow-up is needed for simple Bosniak type 1 and 2 renal cysts, unless the patient has a malignancy history or risk factors. No stones in the kidneys or ureters. No hydronephrosis. No perinephric or periureteral stranding. Urinary bladder is unremarkable. GI AND BOWEL: Stomach demonstrates no acute abnormality. Appendix is not visualized. There is no bowel obstruction. REPRODUCTIVE ORGANS: No acute abnormality. PERITONEUM AND RETROPERITONEUM: No ascites. No free air. There is a small fat-containing umbilical hernia. VASCULATURE: Aorta is normal in caliber. There are atherosclerotic calcifications of the aorta and iliac arteries. ABDOMINAL AND PELVIS LYMPH NODES: No lymphadenopathy. BONES AND SOFT TISSUES: There are degenerative changes throughout the spine. L4-L5 lumbar fusion hardware present. There is a comminuted right femoral intertrochanteric fracture with superolateral displacement of the distal fracture fragment. No focal soft tissue abnormality. IMPRESSION: 1. Comminuted right intertrochanteric femur fracture with superolateral displacement of the distal fragment. 2. Small right lower lobe airspace disease. No pleural effusion or pneumothorax. 3. Moderate hiatal hernia. Electronically signed by: Greig Pique MD 10/21/2023 09:00 PM EDT RP Workstation: [id number]   CT HEAD WO CONTRAST Result Date: 10/21/2023 EXAM: CT HEAD AND CERVICAL SPINE 10/21/2023 08:40:45 PM TECHNIQUE: CT of the head and cervical spine was performed without the administration of intravenous contrast. Multiplanar reformatted images are provided for review. Automated exposure control,  iterative reconstruction, and/or weight based adjustment of the mA/kV was utilized to reduce the radiation dose to as low as reasonably achievable. COMPARISON: MRI brain dated 08/28/2022. CLINICAL HISTORY: Head trauma, moderate-severe. FINDINGS: CT HEAD BRAIN AND VENTRICLES: No acute intracranial hemorrhage. No mass effect or midline shift. No abnormal extra-axial fluid collection. No evidence of acute infarct. No hydrocephalus. Subcortical and periventricular small vessel ischemic changes. ORBITS: No acute abnormality. SINUSES AND MASTOIDS: No acute abnormality. SOFT TISSUES AND SKULL: No acute skull fracture. No acute soft tissue abnormality. CT CERVICAL SPINE BONES AND ALIGNMENT: No acute fracture or traumatic malalignment. 4 mm anterolisthesis of C7 on T1. DEGENERATIVE CHANGES: Mild degenerative changes of the mid to lower cervical spine. SOFT TISSUES: No prevertebral soft tissue swelling. IMPRESSION: 1. No acute intracranial abnormality. 2. No acute traumatic injury to the cervical spine. Electronically signed by: Pinkie Pebbles MD 10/21/2023 08:58 PM EDT RP Workstation: [id number]   CT CERVICAL SPINE WO CONTRAST Result Date: 10/21/2023 EXAM: CT HEAD AND CERVICAL SPINE 10/21/2023 08:40:45 PM TECHNIQUE: CT of the head and cervical spine was performed without the administration of intravenous contrast. Multiplanar reformatted images are provided for review. Automated exposure control, iterative reconstruction, and/or weight based adjustment of the mA/kV was utilized to reduce the radiation dose to as low as reasonably achievable. COMPARISON: MRI brain dated 08/28/2022. CLINICAL HISTORY: Head trauma, moderate-severe. FINDINGS: CT HEAD BRAIN AND VENTRICLES: No acute intracranial hemorrhage. No mass effect or midline shift. No abnormal extra-axial  fluid collection. No evidence of acute infarct. No hydrocephalus. Subcortical and periventricular small vessel ischemic changes. ORBITS: No acute abnormality.  SINUSES AND MASTOIDS: No acute abnormality. SOFT TISSUES AND SKULL: No acute skull fracture. No acute soft tissue abnormality. CT CERVICAL SPINE BONES AND ALIGNMENT: No acute fracture or traumatic malalignment. 4 mm anterolisthesis of C7 on T1. DEGENERATIVE CHANGES: Mild degenerative changes of the mid to lower cervical spine. SOFT TISSUES: No prevertebral soft tissue swelling. IMPRESSION: 1. No acute intracranial abnormality. 2. No acute traumatic injury to the cervical spine. Electronically signed by: Pinkie Pebbles MD 10/21/2023 08:58 PM EDT RP Workstation: [id number]   DG Pelvis Portable Result Date: 10/21/2023 EXAM: 1 or 2 VIEW(S) XRAY OF THE PELVIS 10/21/2023 08:18:00 PM COMPARISON: None available. CLINICAL HISTORY: Fall. FINDINGS: BONES AND JOINTS: Acute comminuted and displaced right intertrochanteric fracture. No right hip dislocation. No acute displaced fracture or dislocation of the left hip. No acute displaced fracture or diastasis of the bones of the pelvis. Degenerative changes of the visualized lower lumbar spine. L5-S1 surgical hardware noted. Sacrum is grossly unremarkable - limited evaluation due to overlapping osseous structures and overlying soft tissues. SOFT TISSUES: The soft tissues are unremarkable. IMPRESSION: 1. Acute comminuted and displaced right intertrochanteric fracture. Electronically signed by: Morgane Naveau MD 10/21/2023 08:31 PM EDT RP Workstation: [id number]   DG Chest Port 1 View Result Date: 10/21/2023 EXAM: 1 VIEW XRAY OF THE CHEST 10/21/2023 08:18:00 PM COMPARISON: CT chest dated 12/21/2017. CLINICAL HISTORY: Trauma. fall. FINDINGS: LUNGS AND PLEURA: Chronic coarsened interstitial markings with no overt pulmonary edema. No focal consolidation. No pleural effusion. No pneumothorax. HEART AND MEDIASTINUM: Unchanged cardiomediastinal silhouette. Atherosclerotic plaque. Surgical changes overlies the mediastinum. BONES AND SOFT TISSUES: Intact sternotomy wires. No acute  osseous abnormality. IMPRESSION: 1. No acute cardiopulmonary process identified. Electronically signed by: Morgane Naveau MD 10/21/2023 08:29 PM EDT RP Workstation: [id number]     .Laceration Repair  Date/Time: 10/21/2023 11:01 PM  Performed by: Melvenia Motto, MD Authorized by: Melvenia Motto, MD   Consent:    Consent obtained:  Verbal   Consent given by:  Patient   Risks, benefits, and alternatives were discussed: yes   Universal protocol:    Procedure explained and questions answered to patient or proxy's satisfaction: yes     Imaging studies available: yes     Patient identity confirmed:  Verbally with patient Anesthesia:    Anesthesia method:  None Laceration details:    Location:  Scalp   Scalp location:  R temporal   Length (cm):  0.5   Depth (mm):  1 Treatment:    Area cleansed with:  Saline   Amount of cleaning:  Standard Skin repair:    Repair method:  Tissue adhesive Approximation:    Approximation:  Close Repair type:    Repair type:  Simple Post-procedure details:    Dressing:  Open (no dressing)   Procedure completion:  Tolerated well, no immediate complications    Medications Ordered in the ED  fentaNYL  (SUBLIMAZE ) injection 50 mcg (50 mcg Intravenous Given 10/21/23 2241)  HYDROcodone -acetaminophen  (NORCO/VICODIN) 5-325 MG per tablet 1-2 tablet (has no administration in time range)  methocarbamol  (ROBAXIN ) tablet 500 mg (500 mg Oral Given 10/21/23 2235)    Or  methocarbamol  (ROBAXIN ) injection 500 mg ( Intravenous See Alternative 10/21/23 2235)  insulin  aspart (novoLOG ) injection 0-9 Units (has no administration in time range)  fentaNYL  (SUBLIMAZE ) injection 50 mcg (50 mcg Intravenous Given 10/21/23 2020)  Medical Decision Making Amount and/or Complexity of Data Reviewed Labs: ordered. Radiology: ordered.  Risk Prescription drug management. Decision regarding hospitalization.   This patient presents to the ED for  concern of fall, this involves an extensive number of treatment options, and is a complaint that carries with it a high risk of complications and morbidity.  The differential diagnosis includes acute injuries   Co morbidities / Chronic conditions that complicate the patient evaluation  HTN, HLD, anxiety, DM, GERD, CAD, anemia   Additional history obtained:  Additional history obtained from EMR External records from outside source obtained and reviewed including EMS   Lab Tests:  I Ordered, and personally interpreted labs.  The pertinent results include: Baseline creatinine, hyperglycemia without evidence of DKA, baseline anemia, no leukocytosis   Imaging Studies ordered:  I ordered imaging studies including x-ray of chest and pelvis, CT of head, cervical spine, chest, abdomen, pelvis I independently visualized and interpreted imaging which showed comminuted right intertrochanteric femur fracture I agree with the radiologist interpretation   Cardiac Monitoring: / EKG:  The patient was maintained on a cardiac monitor.  I personally viewed and interpreted the cardiac monitored which showed an underlying rhythm of: Atrial fibrillation   Problem List / ED Course / Critical interventions / Medication management  Patient presenting after ground-level mechanical fall at nursing facility.  On arrival, she is alert and oriented.  She has some mild bruising and a small laceration to her right temporal area.  She has right leg shortening with right hip pain.  As needed fentanyl  was ordered.  Workup was initiated.  Pelvic x-ray confirms right hip fracture.  I spoke with orthopedic surgeon on-call, who will see in consult.  Per review of patient's paperwork, she is on aspirin  and Brilinta .  Further imaging studies did not show any further acute injuries.  Small right temporal laceration was cleansed and closed with Dermabond.  Patient was admitted for further management. I ordered medication  including fentanyl  for analgesia Reevaluation of the patient after these medicines showed that the patient improved I have reviewed the patients home medicines and have made adjustments as needed   Consultations Obtained:  I requested consultation with the orthopedic surgeon, Dr. Reyne,  and discussed lab and imaging findings as well as pertinent plan - they recommend: Will see in consult   Social Determinants of Health:  Resides in nursing facility     Final diagnoses:  Fall, initial encounter  Displaced intertrochanteric fracture of right femur, initial encounter for closed fracture [facility])    ED Discharge Orders     None          Melvenia Motto, MD 10/21/23 2302

## 2023-10-21 NOTE — Assessment & Plan Note (Signed)
 On aspirin  Coreg  Crestor 

## 2023-10-21 NOTE — Assessment & Plan Note (Signed)
 Hold aspirin  and Brilinta 

## 2023-10-21 NOTE — Assessment & Plan Note (Signed)
 Obtain anemia panel  Transfuse for Hg <7 , rapidly dropping or  if symptomatic

## 2023-10-21 NOTE — Subjective & Objective (Signed)
 Patient arrives from [place] she fell down and hit her right side hit her head and right hip noted to have shortening right leg Patient has known history of diabetes CAD she is on Plavix  Chronic respiratory failure for which she is on 3 L of oxygen  at baseline Orthopedics been consulted orthopedic surgeon, Dr. Reyne,

## 2023-10-21 NOTE — Assessment & Plan Note (Signed)
 Somewhat soft blood pressures will allow permissive hypertension for tonight

## 2023-10-21 NOTE — Assessment & Plan Note (Addendum)
-   management as per orthopedics,      Keep nothing by mouth post midnight. Patient   not on anticoagulation but antiplatelet agents   on hold Ordered type and screen,  order a vitamin D level  Patient at baseline  unable to walk a flight of stairs or 100 feet   due to  generalize fatigue and deconditioning   shortness of breath.   * Patient denies any chest pain or shortness of breath currently    ECG showing no evidence of acute ischemia  known history of coronary artery disease COPD   CKD  Given advanced age patient is at least moderate  risk    will order echo  CE   Cardiology consult for further pre-op clearance given extensive cardiac disease anesthesia should be aware of patient pulmonary status

## 2023-10-21 NOTE — ED Notes (Signed)
 PT is on blood thinner. PT alert and talking. PT going to CT with TRN

## 2023-10-21 NOTE — ED Notes (Signed)
 Patient transported to CT with TRN on the monitor

## 2023-10-21 NOTE — Assessment & Plan Note (Signed)
 May appreciate cardiology evaluation prior to or as patient has diminished exercise capacity

## 2023-10-21 NOTE — Assessment & Plan Note (Signed)
 Likely in setting of COPD continue oxygen 

## 2023-10-21 NOTE — Assessment & Plan Note (Signed)
Chronic stable continue Crestor 10 mg a day

## 2023-10-22 ENCOUNTER — Encounter (HOSPITAL_COMMUNITY): Payer: Self-pay | Admitting: Internal Medicine

## 2023-10-22 ENCOUNTER — Inpatient Hospital Stay (HOSPITAL_COMMUNITY)

## 2023-10-22 ENCOUNTER — Encounter (HOSPITAL_COMMUNITY)
Admission: EM | Disposition: A | Discharge status: Skilled Nursing Facility | Payer: Self-pay | Source: Skilled Nursing Facility | Attending: Family Medicine

## 2023-10-22 ENCOUNTER — Inpatient Hospital Stay (HOSPITAL_COMMUNITY): Admitting: Student

## 2023-10-22 DIAGNOSIS — S72001A Fracture of unspecified part of neck of right femur, initial encounter for closed fracture: Secondary | ICD-10-CM

## 2023-10-22 DIAGNOSIS — Z01818 Encounter for other preprocedural examination: Secondary | ICD-10-CM | POA: Diagnosis not present

## 2023-10-22 DIAGNOSIS — I272 Pulmonary hypertension, unspecified: Secondary | ICD-10-CM

## 2023-10-22 DIAGNOSIS — I2510 Atherosclerotic heart disease of native coronary artery without angina pectoris: Secondary | ICD-10-CM | POA: Diagnosis not present

## 2023-10-22 DIAGNOSIS — W19XXXA Unspecified fall, initial encounter: Secondary | ICD-10-CM | POA: Diagnosis not present

## 2023-10-22 DIAGNOSIS — I110 Hypertensive heart disease with heart failure: Secondary | ICD-10-CM

## 2023-10-22 DIAGNOSIS — Z01810 Encounter for preprocedural cardiovascular examination: Secondary | ICD-10-CM | POA: Diagnosis not present

## 2023-10-22 DIAGNOSIS — I5032 Chronic diastolic (congestive) heart failure: Secondary | ICD-10-CM | POA: Diagnosis not present

## 2023-10-22 HISTORY — PX: INTRAMEDULLARY (IM) NAIL INTERTROCHANTERIC: SHX5875

## 2023-10-22 LAB — VITAMIN D 25 HYDROXY (VIT D DEFICIENCY, FRACTURES): Vit D, 25-Hydroxy: 32.43 ng/mL (ref 30–100)

## 2023-10-22 LAB — ECHOCARDIOGRAM COMPLETE
AR max vel: 1.53 cm2
AV Area VTI: 1.68 cm2
AV Area mean vel: 1.55 cm2
AV Mean grad: 9.5 mmHg
AV Peak grad: 20.3 mmHg
Ao pk vel: 2.26 m/s
Area-P 1/2: 3.93 cm2
Calc EF: 71.4 %
Est EF: >75
Height: 62 in
MV VTI: 2.64 cm2
S' Lateral: 3 cm
Single Plane A2C EF: 70.6 %
Single Plane A4C EF: 71.7 %
Weight: 2966.51 [oz_av]

## 2023-10-22 LAB — GLUCOSE, CAPILLARY
Glucose-Capillary: 200 mg/dL — ABNORMAL HIGH (ref 70–99)
Glucose-Capillary: 259 mg/dL — ABNORMAL HIGH (ref 70–99)
Glucose-Capillary: 278 mg/dL — ABNORMAL HIGH (ref 70–99)
Glucose-Capillary: 239 mg/dL — ABNORMAL HIGH (ref 70–99)
Glucose-Capillary: 209 mg/dL — ABNORMAL HIGH (ref 70–99)
Glucose-Capillary: 228 mg/dL — ABNORMAL HIGH (ref 70–99)
Glucose-Capillary: 206 mg/dL — ABNORMAL HIGH (ref 70–99)
Glucose-Capillary: 299 mg/dL — ABNORMAL HIGH (ref 70–99)

## 2023-10-22 LAB — BLOOD GAS, VENOUS
Acid-Base Excess: 7 mmol/L — ABNORMAL HIGH (ref 0.0–2.0)
Bicarbonate: 35.1 mmol/L — ABNORMAL HIGH (ref 20.0–28.0)
O2 Saturation: 81.9 %
Patient temperature: 37
pCO2, Ven: 65 mmHg — ABNORMAL HIGH (ref 44–60)
pH, Ven: 7.34 (ref 7.25–7.43)
pO2, Ven: 51 mmHg — ABNORMAL HIGH (ref 32–45)

## 2023-10-22 LAB — COMPREHENSIVE METABOLIC PANEL WITH GFR
ALT: 86 U/L — ABNORMAL HIGH (ref 0–44)
AST: 138 U/L — ABNORMAL HIGH (ref 15–41)
Albumin: 3.5 g/dL (ref 3.5–5.0)
Alkaline Phosphatase: 107 U/L (ref 38–126)
Anion gap: 11 (ref 5–15)
BUN: 51 mg/dL — ABNORMAL HIGH (ref 8–23)
CO2: 29 mmol/L (ref 22–32)
Calcium: 9.1 mg/dL (ref 8.9–10.3)
Chloride: 94 mmol/L — ABNORMAL LOW (ref 98–111)
Creatinine, Ser: 1.23 mg/dL — ABNORMAL HIGH (ref 0.44–1.00)
GFR, Estimated: 44 mL/min — ABNORMAL LOW (ref >60)
Glucose, Bld: 186 mg/dL — ABNORMAL HIGH (ref 70–99)
Potassium: 3.9 mmol/L (ref 3.5–5.1)
Sodium: 134 mmol/L — ABNORMAL LOW (ref 135–145)
Total Bilirubin: 0.8 mg/dL (ref 0.0–1.2)
Total Protein: 6.8 g/dL (ref 6.5–8.1)

## 2023-10-22 LAB — FERRITIN: Ferritin: 206 ng/mL (ref 11–307)

## 2023-10-22 LAB — CBC
HCT: 30.1 % — ABNORMAL LOW (ref 36.0–46.0)
Hemoglobin: 9.8 g/dL — ABNORMAL LOW (ref 12.0–15.0)
MCH: 34.1 pg — ABNORMAL HIGH (ref 26.0–34.0)
MCHC: 32.6 g/dL (ref 30.0–36.0)
MCV: 104.9 fL — ABNORMAL HIGH (ref 80.0–100.0)
Platelets: 166 K/uL (ref 150–400)
RBC: 2.87 MIL/uL — ABNORMAL LOW (ref 3.87–5.11)
RDW: 13.1 % (ref 11.5–15.5)
WBC: 8.6 K/uL (ref 4.0–10.5)
nRBC: 0 % (ref 0.0–0.2)

## 2023-10-22 LAB — SURGICAL PCR SCREEN
MRSA, PCR: NEGATIVE
Staphylococcus aureus: POSITIVE — AB

## 2023-10-22 LAB — PROCALCITONIN: Procalcitonin: <0.1 ng/mL

## 2023-10-22 LAB — IRON AND TIBC
Iron: 66 ug/dL (ref 28–170)
Saturation Ratios: 21 % (ref 10.4–31.8)
TIBC: 308 ug/dL (ref 250–450)
UIBC: 242 ug/dL

## 2023-10-22 LAB — FOLATE: Folate: >20 ng/mL (ref >5.9)

## 2023-10-22 LAB — VITAMIN B12: Vitamin B-12: 325 pg/mL (ref 180–914)

## 2023-10-22 SURGERY — FIXATION, FRACTURE, INTERTROCHANTERIC, WITH INTRAMEDULLARY ROD
Anesthesia: General | Site: Leg Upper | Laterality: Right

## 2023-10-22 MED ORDER — ENOXAPARIN SODIUM 40 MG/0.4ML IJ SOSY
40.0000 mg | PREFILLED_SYRINGE | INTRAMUSCULAR | Status: DC
  Administered 2023-10-23 – 2023-10-27 (×5): 40 mg via SUBCUTANEOUS
  Filled 2023-10-22 (×5): qty 0.4

## 2023-10-22 MED ORDER — FENTANYL CITRATE (PF) 100 MCG/2ML IJ SOLN: 50.0000 ug | Freq: Once | INTRAMUSCULAR | Status: AC

## 2023-10-22 MED ORDER — ONDANSETRON HCL 4 MG/2ML IJ SOLN
INTRAMUSCULAR | Status: DC | PRN
  Administered 2023-10-22: 4 mg via INTRAVENOUS

## 2023-10-22 MED ORDER — VASOPRESSIN 20 UNIT/ML IV SOLN
INTRAVENOUS | Status: DC | PRN
  Administered 2023-10-22 (×3): 1 [IU] via INTRAVENOUS

## 2023-10-22 MED ORDER — FENTANYL CITRATE (PF) 100 MCG/2ML IJ SOLN
INTRAMUSCULAR | Status: AC
  Administered 2023-10-22: 50 ug via INTRAVENOUS
  Filled 2023-10-22: qty 2

## 2023-10-22 MED ORDER — CHLORHEXIDINE GLUCONATE 4 % EX SOLN: 60.0000 mL | Freq: Once | CUTANEOUS | Status: DC

## 2023-10-22 MED ORDER — FENTANYL CITRATE (PF) 100 MCG/2ML IJ SOLN: 25.0000 ug | INTRAMUSCULAR | Status: DC | PRN

## 2023-10-22 MED ORDER — PHENYLEPHRINE HCL (PRESSORS) 10 MG/ML IV SOLN
INTRAVENOUS | Status: AC
  Filled 2023-10-22: qty 1

## 2023-10-22 MED ORDER — ONDANSETRON HCL 4 MG PO TABS
4.0000 mg | ORAL_TABLET | Freq: Four times a day (QID) | ORAL | Status: DC | PRN
Start: 2023-10-22 — End: 2023-10-27
  Administered 2023-10-23 – 2023-10-27 (×2): 4 mg via ORAL
  Filled 2023-10-22 (×2): qty 1

## 2023-10-22 MED ORDER — ROSUVASTATIN CALCIUM 5 MG PO TABS
10.0000 mg | ORAL_TABLET | Freq: Every day | ORAL | Status: DC
Start: 2023-10-22 — End: 2023-10-27
  Administered 2023-10-23 – 2023-10-27 (×5): 10 mg via ORAL
  Filled 2023-10-22 (×5): qty 2

## 2023-10-22 MED ORDER — PHENYLEPHRINE 80 MCG/ML (10ML) SYRINGE FOR IV PUSH (FOR BLOOD PRESSURE SUPPORT)
PREFILLED_SYRINGE | INTRAVENOUS | Status: AC
  Filled 2023-10-22: qty 10

## 2023-10-22 MED ORDER — CHLORHEXIDINE GLUCONATE 4 % EX SOLN: 1.0000 | CUTANEOUS | 1 refills | Status: DC

## 2023-10-22 MED ORDER — CITALOPRAM HYDROBROMIDE 20 MG PO TABS
20.0000 mg | ORAL_TABLET | Freq: Every day | ORAL | Status: DC
  Administered 2023-10-23 – 2023-10-27 (×5): 20 mg via ORAL
  Filled 2023-10-22 (×5): qty 1

## 2023-10-22 MED ORDER — ROCURONIUM BROMIDE 10 MG/ML (PF) SYRINGE
PREFILLED_SYRINGE | INTRAVENOUS | Status: AC
  Filled 2023-10-22: qty 10

## 2023-10-22 MED ORDER — ONDANSETRON HCL 4 MG/2ML IJ SOLN: 4.0000 mg | Freq: Four times a day (QID) | INTRAMUSCULAR | Status: DC | PRN

## 2023-10-22 MED ORDER — PHENYLEPHRINE HCL-NACL 20-0.9 MG/250ML-% IV SOLN
INTRAVENOUS | Status: AC
  Filled 2023-10-22: qty 250

## 2023-10-22 MED ORDER — PROPOFOL 10 MG/ML IV BOLUS
INTRAVENOUS | Status: DC | PRN
  Administered 2023-10-22: 120 mg via INTRAVENOUS

## 2023-10-22 MED ORDER — DOCUSATE SODIUM 100 MG PO CAPS
100.0000 mg | ORAL_CAPSULE | Freq: Two times a day (BID) | ORAL | Status: DC
Start: 2023-10-22 — End: 2023-10-27
  Administered 2023-10-22 – 2023-10-27 (×10): 100 mg via ORAL
  Filled 2023-10-22 (×10): qty 1

## 2023-10-22 MED ORDER — VASOPRESSIN 20 UNIT/ML IV SOLN
INTRAVENOUS | Status: AC
  Filled 2023-10-22: qty 1

## 2023-10-22 MED ORDER — LACTATED RINGERS IV SOLN: INTRAVENOUS | Status: DC

## 2023-10-22 MED ORDER — CEFAZOLIN SODIUM-DEXTROSE 2-4 GM/100ML-% IV SOLN
2.0000 g | Freq: Four times a day (QID) | INTRAVENOUS | Status: AC
  Administered 2023-10-22 – 2023-10-23 (×3): 2 g via INTRAVENOUS
  Filled 2023-10-22 (×3): qty 100

## 2023-10-22 MED ORDER — SUGAMMADEX SODIUM 200 MG/2ML IV SOLN
INTRAVENOUS | Status: DC | PRN
  Administered 2023-10-22 (×2): 200 mg via INTRAVENOUS

## 2023-10-22 MED ORDER — INSULIN ASPART 100 UNIT/ML IJ SOLN
INTRAMUSCULAR | Status: AC
  Filled 2023-10-22: qty 1

## 2023-10-22 MED ORDER — INSULIN ASPART 100 UNIT/ML IJ SOLN
0.0000 [IU] | INTRAMUSCULAR | Status: DC | PRN
  Administered 2023-10-22: 3 [IU] via SUBCUTANEOUS

## 2023-10-22 MED ORDER — GABAPENTIN 300 MG PO CAPS
300.0000 mg | ORAL_CAPSULE | Freq: Three times a day (TID) | ORAL | Status: DC
Start: 2023-10-22 — End: 2023-10-27
  Administered 2023-10-22 – 2023-10-27 (×14): 300 mg via ORAL
  Filled 2023-10-22 (×14): qty 1

## 2023-10-22 MED ORDER — CHLORHEXIDINE GLUCONATE 0.12 % MT SOLN
15.0000 mL | Freq: Once | OROMUCOSAL | Status: AC
  Administered 2023-10-22: 15 mL via OROMUCOSAL
  Filled 2023-10-22: qty 15

## 2023-10-22 MED ORDER — PHENYLEPHRINE HCL-NACL 20-0.9 MG/250ML-% IV SOLN
INTRAVENOUS | Status: DC | PRN
  Administered 2023-10-22: 80 ug via INTRAVENOUS
  Administered 2023-10-22: 160 ug via INTRAVENOUS
  Administered 2023-10-22 (×5): 80 ug via INTRAVENOUS
  Administered 2023-10-22: 160 ug via INTRAVENOUS

## 2023-10-22 MED ORDER — FENTANYL CITRATE (PF) 250 MCG/5ML IJ SOLN
INTRAMUSCULAR | Status: AC
  Filled 2023-10-22: qty 5

## 2023-10-22 MED ORDER — MUPIROCIN 2 % EX OINT: 1.0000 | TOPICAL_OINTMENT | Freq: Two times a day (BID) | CUTANEOUS | 0 refills | Status: DC

## 2023-10-22 MED ORDER — DEXAMETHASONE SOD PHOSPHATE PF 10 MG/ML IJ SOLN
INTRAMUSCULAR | Status: DC | PRN
  Administered 2023-10-22: 5 mg via INTRAVENOUS

## 2023-10-22 MED ORDER — CARVEDILOL 6.25 MG PO TABS
6.2500 mg | ORAL_TABLET | Freq: Two times a day (BID) | ORAL | Status: DC
  Administered 2023-10-22 – 2023-10-27 (×9): 6.25 mg via ORAL
  Filled 2023-10-22 (×10): qty 1

## 2023-10-22 MED ORDER — LIDOCAINE HCL (CARDIAC) PF 100 MG/5ML IV SOSY
PREFILLED_SYRINGE | INTRAVENOUS | Status: DC | PRN
  Administered 2023-10-22: 60 mg via INTRAVENOUS

## 2023-10-22 MED ORDER — POVIDONE-IODINE 10 % EX SWAB: 2.0000 | Freq: Once | CUTANEOUS | Status: DC

## 2023-10-22 MED ORDER — LIDOCAINE 2% (20 MG/ML) 5 ML SYRINGE
INTRAMUSCULAR | Status: AC
  Filled 2023-10-22: qty 5

## 2023-10-22 MED ORDER — FENTANYL CITRATE (PF) 250 MCG/5ML IJ SOLN
INTRAMUSCULAR | Status: DC | PRN
  Administered 2023-10-22: 100 ug via INTRAVENOUS

## 2023-10-22 MED ORDER — PANTOPRAZOLE SODIUM 40 MG PO TBEC
40.0000 mg | DELAYED_RELEASE_TABLET | Freq: Every day | ORAL | Status: DC
  Administered 2023-10-23 – 2023-10-27 (×5): 40 mg via ORAL
  Filled 2023-10-22 (×5): qty 1

## 2023-10-22 MED ORDER — POLYETHYLENE GLYCOL 3350 17 G PO PACK
17.0000 g | PACK | Freq: Every day | ORAL | Status: DC | PRN
  Administered 2023-10-25: 17 g via ORAL
  Filled 2023-10-22: qty 1

## 2023-10-22 MED ORDER — 0.9 % SODIUM CHLORIDE (POUR BTL) OPTIME
TOPICAL | Status: DC | PRN
  Administered 2023-10-22: 1000 mL

## 2023-10-22 MED ORDER — ONDANSETRON HCL 4 MG/2ML IJ SOLN
INTRAMUSCULAR | Status: AC
  Filled 2023-10-22: qty 2

## 2023-10-22 MED ORDER — TRANEXAMIC ACID-NACL 1000-0.7 MG/100ML-% IV SOLN
1000.0000 mg | INTRAVENOUS | Status: AC
  Administered 2023-10-22: 1000 mg via INTRAVENOUS
  Filled 2023-10-22: qty 100

## 2023-10-22 MED ORDER — BUSPIRONE HCL 10 MG PO TABS
10.0000 mg | ORAL_TABLET | Freq: Three times a day (TID) | ORAL | Status: DC
  Administered 2023-10-22 – 2023-10-27 (×14): 10 mg via ORAL
  Filled 2023-10-22 (×14): qty 1

## 2023-10-22 MED ORDER — AMISULPRIDE (ANTIEMETIC) 5 MG/2ML IV SOLN: 10.0000 mg | Freq: Once | INTRAVENOUS | Status: DC | PRN

## 2023-10-22 MED ORDER — ALBUMIN HUMAN 5 % IV SOLN: INTRAVENOUS | Status: DC | PRN

## 2023-10-22 MED ORDER — ORAL CARE MOUTH RINSE: 15.0000 mL | Freq: Once | OROMUCOSAL | Status: AC

## 2023-10-22 MED ORDER — ROCURONIUM BROMIDE 10 MG/ML (PF) SYRINGE
PREFILLED_SYRINGE | INTRAVENOUS | Status: DC | PRN
  Administered 2023-10-22: 60 mg via INTRAVENOUS

## 2023-10-22 MED ORDER — CEFAZOLIN SODIUM-DEXTROSE 2-4 GM/100ML-% IV SOLN
2.0000 g | INTRAVENOUS | Status: AC
  Administered 2023-10-22: 2 g via INTRAVENOUS
  Filled 2023-10-22: qty 100

## 2023-10-22 MED ORDER — MUPIROCIN 2 % EX OINT
1.0000 | TOPICAL_OINTMENT | Freq: Two times a day (BID) | CUTANEOUS | Status: AC
  Administered 2023-10-22 – 2023-10-26 (×9): 1 via NASAL
  Filled 2023-10-22 (×3): qty 22

## 2023-10-22 SURGICAL SUPPLY — 36 items
ALCOHOL 70% 16 OZ (MISCELLANEOUS) ×1 IMPLANT
BAG COUNTER SPONGE SURGICOUNT (BAG) ×1 IMPLANT
BIT DRILL INTERTAN LAG SCREW (BIT) IMPLANT
BIT DRILL LONG 4.0 (BIT) IMPLANT
BLADE SURG 10 STRL SS (BLADE) ×1 IMPLANT
BNDG COHESIVE 4X5 TAN STRL LF (GAUZE/BANDAGES/DRESSINGS) ×1 IMPLANT
BNDG COHESIVE 6X5 TAN ST LF (GAUZE/BANDAGES/DRESSINGS) ×1 IMPLANT
CHLORAPREP W/TINT 26 (MISCELLANEOUS) ×1 IMPLANT
COVER BACK TABLE 80X110 HD (DRAPES) ×1 IMPLANT
COVER PERINEAL POST (MISCELLANEOUS) ×1 IMPLANT
COVER SURGICAL LIGHT HANDLE (MISCELLANEOUS) ×1 IMPLANT
DRAPE C-ARM 42X72 X-RAY (DRAPES) IMPLANT
DRAPE C-ARMOR (DRAPES) IMPLANT
DRAPE STERI IOBAN 125X83 (DRAPES) ×1 IMPLANT
DRESSING MEPILEX FLEX 4X4 (GAUZE/BANDAGES/DRESSINGS) ×3 IMPLANT
GLOVE SURG SYN 8.0 PF PI (GLOVE) ×2 IMPLANT
GOWN STRL REUS W/TWL LRG LVL3 (GOWN DISPOSABLE) ×1 IMPLANT
GOWN STRL REUS W/TWL XL LVL3 (GOWN DISPOSABLE) ×1 IMPLANT
KIT BASIN OR (CUSTOM PROCEDURE TRAY) ×1 IMPLANT
KIT TURNOVER KIT B (KITS) ×1 IMPLANT
NAIL TRIGEN INTERTAN 10X18CM (Nail) IMPLANT
PAD ARMBOARD POSITIONER FOAM (MISCELLANEOUS) ×3 IMPLANT
PIN GUIDE 3.2X343MM (PIN) IMPLANT
SCREW LAG COMPR KIT 100/95 (Screw) IMPLANT
SCREW TRIGEN LOW PROF 5.0X35 (Screw) IMPLANT
SOLN 0.9% NACL 1000 ML (IV SOLUTION) ×1 IMPLANT
SOLN 0.9% NACL POUR BTL 1000ML (IV SOLUTION) ×1 IMPLANT
SOLN STERILE WATER 1000 ML (IV SOLUTION) ×1 IMPLANT
SOLN STERILE WATER BTL 1000 ML (IV SOLUTION) ×1 IMPLANT
SPONGE T-LAP 18X18 ~~LOC~~+RFID (SPONGE) ×1 IMPLANT
STAPLER SKIN PROX 35W (STAPLE) ×1 IMPLANT
SUT VIC AB 1 CTX36XBRD ANBCTRL (SUTURE) ×1 IMPLANT
SUT VIC AB 2-0 SH 27X BRD (SUTURE) ×1 IMPLANT
SYR BULB IRRIG 60ML STRL (SYRINGE) IMPLANT
TOWEL GREEN STERILE (TOWEL DISPOSABLE) ×1 IMPLANT
TRAY FOL W/BAG SLVR 16FR STRL (SET/KITS/TRAYS/PACK) IMPLANT

## 2023-10-22 NOTE — Consult Note (Addendum)
 Cardiology Consultation   Patient ID: Andrea Kirk MRN: [id number]; DOB: 22-Dec-1940  Admit date: 10/21/2023 Date of Consult: 10/22/2023  PCP:  System, Provider Not In    [facility] Providers Cardiologist:  Andrea Reek, MD      Patient Profile: Andrea Kirk is a 83 y.o. female with a hx of hypertension, hyperlipidemia, type II diabetes, diastolic heart failure, mild LVH,CVA, history of falls, GERD, carotid artery disease s/p left CAE in 2015, right CAE in 2018, and CAD s/p CABG in 1997 who is being seen 10/22/2023 for a preop evaluation at the request of Andrea Monte MD.  History of Present Illness: Andrea Kirk is an 83 year old female with prior cardiac history listed below  Patient has a remote history of a stress test that was done back in 2012  Most recent echo prior to admission was done on 02/2021 and showed a low normal LVEF of 50 to 55%, mild concentric LVH, G3 DD, elevated LVEDP, normal RV function, severe pulmonary hypertension with a systolic pressure of 60.1 mmHg, severe left atrial dilation, moderate right atrial dilation, moderate MAC, mild MR, and moderate TR.  A carotid duplex was done on 01/2023 and showed 1 to 39% stenosis in the right ICA, and 40 to 59% stenosis in the left ICA.  Flow in the vertebral arteries was normal but flow was disturbed in the left subclavian.  At most recent outpatient follow-up was with Andrea Kirk on 01/2023 the patient was documented as needing oxygen  at her assisted living facility.  The patient also had some worsening lower extremity edema but does not appear to have any anginal symptoms at time and was on Brilinta  and aspirin .  Patient presented to the emergency department with right hip pain following a mechanical fall.  Imaging showed a displaced right intertrochanteric hip fracture.  Orthopedic surgery saw the patient and is recommending a surgical stabilization  On interview patient reported that she has not had any  recent chest pain.  Denies any orthopnea. she can walk inside the assisted living facility that she lives at but she also gets around using a wheelchair.  Is unable to go for longer walks outside.  Is on chronic oxygen  at the facility.  She reports that she has been working with physical therapy to improve her walking.  Unable to do more than 4 metabolic equivalents of exertion.  Labs showed hyponatremia of 134, creatinine of 1.23 baseline appears to be between 1 and 1.2, elevated BUN of 51,, elevated LFTs (AST 138, ALT 86), macrocytic anemia with a hemoglobin of 9.8.  EKG showed normal sinus rhythm with a rate of 68,  First degree AV block, borderline prolonged QTcB of 466.  The EKG had a significant amount of background interference in multiple leads and was somewhat difficult to interpret.  CT chest, abdomen and pelvis showed Comminuted right intertrochanteric femur fracture with superolateral displacement of the distal fragment. Small right lower lobe airspace disease. No pleural effusion or pneumothorax. Moderate hiatal hernia.   CT head and CT C-spine showed no acute traumatic injury.  Past Medical History:  Diagnosis Date   Acute kidney injury 08/05/2016   Aftercare following surgery of the circulatory system, NEC 05/11/2013   AKI (acute kidney injury) 08/04/2016   Anxiety    takes Celexa  daily   ARF (acute renal failure) 11/07/2016   Asymptomatic stenosis of right carotid artery 03/22/2016   Back pain    occasionally   Carotid artery disease 04/16/2013   Carotid artery  occlusion    Carotid stenosis 11/16/2013   Cataract    left and immature   Coronary artery disease    Coronary atherosclerosis of native coronary artery 03/11/2013   S/p CABG in 1997    Depression    Diabetes mellitus    takes Metformin  and Glipizide  daily   Diabetes mellitus (HCC) 05/02/2015   Dizziness    takes Meclizine  daily as needed   Essential hypertension, benign 03/11/2013   GERD (gastroesophageal reflux  disease)    takes Omeprazole daily as needed   Headache(784.0)    Hyperlipidemia    takes Atorvastatin  daily   Hypertension    takes Carvedilol  daily   Mixed hyperlipidemia 03/11/2013   Muscle spasm    takes Robaxin  daily as needed   Nausea    takes Phenergan  daily as needed   Occlusion and stenosis of carotid artery without mention of cerebral infarction 07/19/2011   Pneumonia    hx of-in high school   Restless leg    takes Requip  daily as needed   Seasonal allergies    takes Claritin  daily as needed and Afrin as needed   Shortness of breath    with exertion   Urinary urgency    UTI (urinary tract infection) 11/07/2016    Past Surgical History:  Procedure Laterality Date   COLONOSCOPY WITH PROPOFOL  N/A 03/10/2012   Procedure: COLONOSCOPY WITH PROPOFOL ;  Surgeon: Andrea MARLA Louder, MD;  Location: [facility];  Service: Endoscopy;  Laterality: N/A;   CORNEAL TRANSPLANT Right    CORONARY ARTERY BYPASS GRAFT  1997   x 6   CORONARY ARTERY BYPASS GRAFT  Jan. 1997   ENDARTERECTOMY Left 04/16/2013   Procedure: Left Carotid Artery Endatarectomy with Resection of Redundant Internal Carotid Artery;  Surgeon: Andrea JULIANNA Doing, MD;  Location: [facility] OR;  Service: Vascular;  Laterality: Left;   ENDARTERECTOMY Right 03/22/2016   Procedure: RIGHT ENDARTERECTOMY CAROTID;  Surgeon: Andrea JULIANNA Doing, MD;  Location: [facility] OR;  Service: Vascular;  Laterality: Right;   ESOPHAGOGASTRODUODENOSCOPY N/A 03/10/2012   Procedure: ESOPHAGOGASTRODUODENOSCOPY (EGD);  Surgeon: Andrea MARLA Louder, MD;  Location: [facility];  Service: Endoscopy;  Laterality: N/A;   EYE SURGERY  March 12, 2001   CORNEA TRANSPLANT Right eye   PATCH ANGIOPLASTY Right 03/22/2016   Procedure: PATCH ANGIOPLASTY;  Surgeon: Andrea JULIANNA Doing, MD;  Location: [facility] OR;  Service: Vascular;  Laterality: Right;   PR VEIN BYPASS GRAFT,AORTO-FEM-POP  1997   SPINE SURGERY  march 2013   Back surgery   TONSILLECTOMY     TRIGGER FINGER RELEASE Left    thumb     Home  Medications:  Prior to Admission medications   Medication Sig Start Date End Date Taking? Authorizing Provider  acetaminophen  (TYLENOL ) 500 MG tablet Take 1,000 mg by mouth at bedtime.   Yes [provider]  amLODipine  (NORVASC ) 10 MG tablet Take 10 mg by mouth daily.   Yes [provider]  aspirin  EC 81 MG tablet Take 81 mg by mouth every evening.    Yes [provider]  busPIRone  (BUSPAR ) 10 MG tablet Take 10 mg by mouth 3 (three) times daily. 01/25/23  Yes [provider]  calcium  carbonate (TUMS - DOSED IN MG ELEMENTAL CALCIUM ) 500 MG chewable tablet Chew 1 tablet by mouth 3 (three) times daily before meals.   Yes [provider]  Carboxymethylcellulose Sodium (REFRESH TEARS OP) Place 1 drop into both eyes every 4 (four) hours as needed (for dry eye).  Yes [provider]  carvedilol  (COREG ) 12.5 MG tablet Take 1 tablet (12.5 mg total) by mouth 2 (two) times daily. 06/28/20 10/21/24 Yes Rojelio Nest, DO  citalopram  (CELEXA ) 20 MG tablet Take 20 mg by mouth daily.   Yes [provider]  diphenhydrAMINE -zinc acetate (BENADRYL ) cream Apply 1 Application topically daily. Apply to both arms every day shift for itching   Yes [provider]  docusate sodium  (COLACE) 100 MG capsule Take 100 mg by mouth 2 (two) times daily.   Yes [provider]  empagliflozin (JARDIANCE) 25 MG TABS tablet Take 25 mg by mouth daily.   Yes [provider]  estradiol (ESTRACE) 0.1 MG/GM vaginal cream Place 1 Applicatorful vaginally at bedtime. Twice daily for pain   Yes [provider]  ferrous sulfate  325 (65 FE) MG tablet Take 325 mg by mouth daily. Monday-Friday   Yes [provider]  fluticasone (CUTIVATE) 0.005 % ointment Apply 1 Application topically in the morning, at noon, and at bedtime. Apply to forearms and legs every shift   Yes [provider]  furosemide  (LASIX ) 20 MG tablet Take 1 tablet (20  mg) daily for 3 days, then take once daily as needed for weight gain of 2-3 lbs overnight or 5 lbs in 1 week. Patient taking differently: Take 20 mg by mouth 2 (two) times daily. Take 1 tablet (20 mg) daily for 3 days, then take once daily as needed for weight gain of 2-3 lbs overnight or 5 lbs in 1 week. 02/04/23  Yes Parthenia Olivia HERO, PA-C  gabapentin  (NEURONTIN ) 300 MG capsule Take 300 mg by mouth 3 (three) times daily. 01/25/23  Yes [provider]  glipiZIDE  (GLUCOTROL  XL) 5 MG 24 hr tablet Take 1 tablet (5 mg total) by mouth daily with breakfast. 04/21/20  Yes Milissa Tod PARAS, MD  HYDROcodone -acetaminophen  (NORCO/VICODIN) 5-325 MG tablet Take 1 tablet by mouth daily. At 1pm   Yes [provider]  hyoscyamine  (LEVSIN  SL) 0.125 MG SL tablet Place 1 tablet (0.125 mg total) under the tongue 3 (three) times daily with meals as needed. Patient taking differently: Place 0.125 mg under the tongue 3 (three) times daily before meals. 07/30/23  Yes May, Deanna J, NP  levocetirizine (XYZAL) 5 MG tablet Take 5 mg by mouth every evening.   Yes [provider]  magnesium  oxide (MAG-OX) 400 (240 Mg) MG tablet Take 400 mg by mouth at bedtime.   Yes [provider]  meclizine  (ANTIVERT ) 25 MG tablet Take 25 mg by mouth 3 (three) times daily.   Yes [provider]  melatonin 3 MG TABS tablet Take 3 mg by mouth at bedtime.   Yes [provider]  methocarbamol  (ROBAXIN ) 750 MG tablet Take 750 mg by mouth 2 (two) times daily. 10/31/22  Yes [provider]  mupirocin  ointment (BACTROBAN ) 2 % Apply 1 Application topically 3 (three) times daily. Apply to forearms and legs every shift   Yes [provider]  nystatin powder Apply 1 Application topically 2 (two) times daily. Apply under breasts   Yes [provider]  omeprazole (PRILOSEC) 40 MG capsule Take 40 mg by mouth 2 (two) times daily. 10/03/23  Yes [provider]  oxybutynin   (DITROPAN -XL) 10 MG 24 hr tablet Take 10 mg by mouth daily.   Yes [provider]  rosuvastatin  (CRESTOR ) 10 MG tablet Take 10 mg by mouth daily.   Yes [provider]  saccharomyces boulardii (FLORASTOR) 250 MG capsule  Take 250 mg by mouth daily.   Yes [provider]  SALINE NASAL SPRAY NA Place 1 spray into alternate nostrils in the morning and at bedtime.   Yes [provider]  senna (SENOKOT) 8.6 MG tablet Take 2 tablets by mouth at bedtime.   Yes [provider]  sodium zirconium cyclosilicate  (LOKELMA ) 10 g PACK packet Take 10 g by mouth daily.   Yes [provider]  ticagrelor  (BRILINTA ) 90 MG TABS tablet Take 1 tablet (90 mg total) by mouth 2 (two) times daily. 04/20/20  Yes Milissa Tod PARAS, MD  valsartan (DIOVAN) 80 MG tablet Take 80 mg by mouth daily. 12/03/21  Yes [provider]  OXYGEN  Inhale 2 L/min into the lungs continuous.    [provider]  pantoprazole  (PROTONIX ) 40 MG tablet Take 40 mg by mouth daily. Patient not taking: Reported on 10/22/2023    [provider]    Scheduled Meds:  citalopram   20 mg Oral Daily   insulin  aspart  0-9 Units Subcutaneous Q4H   mupirocin  ointment  1 Application Nasal BID   pantoprazole   40 mg Oral Daily   rosuvastatin   10 mg Oral Daily   Continuous Infusions:  PRN Meds: fentaNYL  (SUBLIMAZE ) injection, HYDROcodone -acetaminophen , methocarbamol  **OR** methocarbamol  (ROBAXIN ) injection  Allergies:    Allergies  Allergen Reactions   Latex Other (See Comments), Rash and Hives    tears skin   Codeine Other (See Comments)    Abnormal behavior   Morphine  And Codeine Other (See Comments)    Affects BP and blood sugar.   Cyclobenzaprine Other (See Comments)    MADE PT SICK- pt unsure if med was cyclobenzaprine or methocarbamol     Social History:   Social History   Socioeconomic History   Marital status: Widowed    Spouse name: Not on file   Number of  children: Not on file   Years of education: Not on file   Highest education level: Not on file  Occupational History   Not on file  Tobacco Use   Smoking status: Never   Smokeless tobacco: Never  Vaping Use   Vaping status: Never Used  Substance and Sexual Activity   Alcohol  use: No    Alcohol /week: 0.0 standard drinks of alcohol    Drug use: No   Sexual activity: Never    Birth control/protection: Post-menopausal  Other Topics Concern   Not on file  Social History Narrative   Not on file   Social Drivers of Health   Financial Resource Strain: Not on file  Food Insecurity: No Food Insecurity (10/22/2023)   Hunger Vital Sign    Worried About Running Out of Food in the Last Year: Never true    Ran Out of Food in the Last Year: Never true  Transportation Needs: No Transportation Needs (10/22/2023)   PRAPARE - Administrator, Civil Service (Medical): No    Lack of Transportation (Non-Medical): No  Physical Activity: Not on file  Stress: Not on file  Social Connections: Moderately Integrated (10/22/2023)   Social Connection and Isolation Panel    Frequency of Communication with Friends and Family: More than three times a week    Frequency of Social Gatherings with Friends and Family: More than three times a week    Attends Religious Services: More than 4 times per year    Active Member of [employer] or Organizations: Yes    Attends Banker Meetings: 1 to 4 times per year  Marital Status: Widowed  Intimate Partner Violence: Not At Risk (10/22/2023)   Humiliation, Afraid, Rape, and Kick questionnaire    Fear of Current or Ex-Partner: No    Emotionally Abused: No    Physically Abused: No    Sexually Abused: No    Family History:    Family History  Problem Relation Age of Onset   Heart attack Father    Heart disease Father 60       Before age of 38   Hyperlipidemia Father    Heart disease Brother        Heart dissease before age 2    Hyperlipidemia Brother    Cirrhosis Mother    Heart attack Paternal Grandfather    Heart disease Paternal Grandfather    Cancer Maternal Grandmother    Alcoholism Maternal Grandfather    Heart attack Paternal Grandmother      ROS:  Please see the history of present illness.   All other ROS reviewed and negative.     Physical Exam/Data: Vitals:   10/22/23 0006 10/22/23 0148 10/22/23 0501 10/22/23 0846  BP:  (!) 114/59 (!) 131/51 (!) 129/52  Pulse:  68 81 81  Resp:  15 15 16   Temp: 98.4 F (36.9 C) 99.1 F (37.3 C) 98.1 F (36.7 C) 98.4 F (36.9 C)  TempSrc:  Axillary Oral   SpO2:  100% 94% (!) 86%  Weight:      Height:        Intake/Output Summary (Last 24 hours) at 10/22/2023 1114 Last data filed at 10/22/2023 0927 Gross per 24 hour  Intake --  Output 300 ml  Net -300 ml      10/21/2023    8:39 PM 10/21/2023    8:13 PM 07/14/2023    1:09 PM  Last 3 Weights  Weight (lbs) 185 lb 6.5 oz 178 lb 179 lb 8 oz  Weight (kg) 84.1 kg 80.74 kg 81.421 kg     Body mass index is 33.91 kg/m.  General: Patient appears frail, on 3 L nasal cannula.  Visibly uncomfortable in bed.  Alert and orientated but takes a significant amount of time to answer questions nurse reported that she recently gave the patient fentanyl . HEENT: normal Neck: no JVD Vascular: No carotid bruits; Distal pulses 2+ bilaterally Cardiac:  normal S1, S2; RRR; no murmur  Lungs:  clear to auscultation bilaterally, no wheezing, rhonchi or rales  Abd: soft, nontender, no hepatomegaly  Ext: no edema Musculoskeletal:  No deformities, BUE and BLE strength normal and equal Skin: warm and dry  Neuro:   no focal abnormalities noted Psych:  Normal affect   EKG:  The EKG was personally reviewed and demonstrates:   normal sinus rhythm with a rate of 68,  First degree AV block, borderline prolonged QTcB of 466.  The EKG had a significant amount of background interference in multiple leads and was somewhat difficult to  interpret. Telemetry:  Telemetry was personally reviewed and demonstrates: Normal sinus rhythm in the 80s  Relevant CV Studies: Echo pending  Laboratory Data: High Sensitivity Troponin:   Recent Labs  Lab 10/21/23 2024  TROPONINIHS 7     Chemistry Recent Labs  Lab 10/21/23 2020 10/21/23 2024 10/22/23 0442  NA 135 134* 134*  K 4.5 4.4 3.9  CL 92* 92* 94*  CO2  --  32 29  GLUCOSE 334* 339* 186*  BUN 52* 49* 51*  CREATININE 1.30* 1.29* 1.23*  CALCIUM   --  8.9 9.1  MG  --  2.2  --   GFRNONAA  --  41* 44*  ANIONGAP  --  10 11    Recent Labs  Lab 10/21/23 2024 10/22/23 0442  PROT 7.1 6.8  ALBUMIN 3.4* 3.5  AST 63* 138*  ALT 52* 86*  ALKPHOS 115 107  BILITOT 0.8 0.8   Lipids No results for input(s): CHOL, TRIG, HDL, LABVLDL, LDLCALC, CHOLHDL in the last 168 hours.  Hematology Recent Labs  Lab 10/21/23 2020 10/21/23 2024 10/22/23 0442  WBC  --  6.1 8.6  RBC  --  2.97*  2.97* 2.87*  HGB 11.6* 10.4* 9.8*  HCT 34.0* 31.9* 30.1*  MCV  --  107.4* 104.9*  MCH  --  35.0* 34.1*  MCHC  --  32.6 32.6  RDW  --  13.2 13.1  PLT  --  189 166   Thyroid  No results for input(s): TSH, FREET4 in the last 168 hours.  BNPNo results for input(s): BNP, PROBNP in the last 168 hours.  DDimer No results for input(s): DDIMER in the last 168 hours.  Radiology/Studies:  CT CHEST ABDOMEN PELVIS WO CONTRAST Result Date: 10/21/2023 EXAM: CT CHEST, ABDOMEN AND PELVIS WITHOUT CONTRAST 10/21/2023 08:40:45 PM TECHNIQUE: CT of the chest, abdomen and pelvis was performed without the administration of intravenous contrast. Multiplanar reformatted images are provided for review. Automated exposure control, iterative reconstruction, and/or weight based adjustment of the mA/kV was utilized to reduce the radiation dose to as low as reasonably achievable. COMPARISON: CT abdomen and pelvis 05/28/2022 and CT angiogram chest 12/21/2017. CLINICAL HISTORY: Polytrauma, blunt. FINDINGS:  CHEST: MEDIASTINUM AND LYMPH NODES: Heart is mildly enlarged. There are atherosclerotic calcifications of the aorta and coronary arteries. Patient is status post cardiac surgery. Sternotomy wires are present. The central airways are clear. No mediastinal, hilar or axillary lymphadenopathy. There is a moderate-sized hiatal hernia and patulous esophagus. LUNGS AND PLEURA: There is a small amount of right lower lobe airspace disease. No pleural effusion or pneumothorax. ABDOMEN AND PELVIS: LIVER: The liver is unremarkable. GALLBLADDER AND BILE DUCTS: Gallbladder is unremarkable. No biliary ductal dilatation. SPLEEN: No acute abnormality. PANCREAS: No acute abnormality. ADRENAL GLANDS: No acute abnormality. KIDNEYS, URETERS AND BLADDER: There is a 3 cm cyst in the inferior pole of the left kidney, unchanged. Per consensus, no follow-up is needed for simple Bosniak type 1 and 2 renal cysts, unless the patient has a malignancy history or risk factors. No stones in the kidneys or ureters. No hydronephrosis. No perinephric or periureteral stranding. Urinary bladder is unremarkable. GI AND BOWEL: Stomach demonstrates no acute abnormality. Appendix is not visualized. There is no bowel obstruction. REPRODUCTIVE ORGANS: No acute abnormality. PERITONEUM AND RETROPERITONEUM: No ascites. No free air. There is a small fat-containing umbilical hernia. VASCULATURE: Aorta is normal in caliber. There are atherosclerotic calcifications of the aorta and iliac arteries. ABDOMINAL AND PELVIS LYMPH NODES: No lymphadenopathy. BONES AND SOFT TISSUES: There are degenerative changes throughout the spine. L4-L5 lumbar fusion hardware present. There is a comminuted right femoral intertrochanteric fracture with superolateral displacement of the distal fracture fragment. No focal soft tissue abnormality. IMPRESSION: 1. Comminuted right intertrochanteric femur fracture with superolateral displacement of the distal fragment. 2. Small right lower lobe  airspace disease. No pleural effusion or pneumothorax. 3. Moderate hiatal hernia. Electronically signed by: Greig Pique MD 10/21/2023 09:00 PM EDT RP Workstation: [id number]   CT HEAD WO CONTRAST Result Date: 10/21/2023 EXAM: CT HEAD AND CERVICAL SPINE 10/21/2023 08:40:45 PM TECHNIQUE: CT of the head and cervical spine was  performed without the administration of intravenous contrast. Multiplanar reformatted images are provided for review. Automated exposure control, iterative reconstruction, and/or weight based adjustment of the mA/kV was utilized to reduce the radiation dose to as low as reasonably achievable. COMPARISON: MRI brain dated 08/28/2022. CLINICAL HISTORY: Head trauma, moderate-severe. FINDINGS: CT HEAD BRAIN AND VENTRICLES: No acute intracranial hemorrhage. No mass effect or midline shift. No abnormal extra-axial fluid collection. No evidence of acute infarct. No hydrocephalus. Subcortical and periventricular small vessel ischemic changes. ORBITS: No acute abnormality. SINUSES AND MASTOIDS: No acute abnormality. SOFT TISSUES AND SKULL: No acute skull fracture. No acute soft tissue abnormality. CT CERVICAL SPINE BONES AND ALIGNMENT: No acute fracture or traumatic malalignment. 4 mm anterolisthesis of C7 on T1. DEGENERATIVE CHANGES: Mild degenerative changes of the mid to lower cervical spine. SOFT TISSUES: No prevertebral soft tissue swelling. IMPRESSION: 1. No acute intracranial abnormality. 2. No acute traumatic injury to the cervical spine. Electronically signed by: Pinkie Pebbles MD 10/21/2023 08:58 PM EDT RP Workstation: [id number]   CT CERVICAL SPINE WO CONTRAST Result Date: 10/21/2023 EXAM: CT HEAD AND CERVICAL SPINE 10/21/2023 08:40:45 PM TECHNIQUE: CT of the head and cervical spine was performed without the administration of intravenous contrast. Multiplanar reformatted images are provided for review. Automated exposure control, iterative reconstruction, and/or weight based adjustment  of the mA/kV was utilized to reduce the radiation dose to as low as reasonably achievable. COMPARISON: MRI brain dated 08/28/2022. CLINICAL HISTORY: Head trauma, moderate-severe. FINDINGS: CT HEAD BRAIN AND VENTRICLES: No acute intracranial hemorrhage. No mass effect or midline shift. No abnormal extra-axial fluid collection. No evidence of acute infarct. No hydrocephalus. Subcortical and periventricular small vessel ischemic changes. ORBITS: No acute abnormality. SINUSES AND MASTOIDS: No acute abnormality. SOFT TISSUES AND SKULL: No acute skull fracture. No acute soft tissue abnormality. CT CERVICAL SPINE BONES AND ALIGNMENT: No acute fracture or traumatic malalignment. 4 mm anterolisthesis of C7 on T1. DEGENERATIVE CHANGES: Mild degenerative changes of the mid to lower cervical spine. SOFT TISSUES: No prevertebral soft tissue swelling. IMPRESSION: 1. No acute intracranial abnormality. 2. No acute traumatic injury to the cervical spine. Electronically signed by: Pinkie Pebbles MD 10/21/2023 08:58 PM EDT RP Workstation: [id number]   DG Pelvis Portable Result Date: 10/21/2023 EXAM: 1 or 2 VIEW(S) XRAY OF THE PELVIS 10/21/2023 08:18:00 PM COMPARISON: None available. CLINICAL HISTORY: Fall. FINDINGS: BONES AND JOINTS: Acute comminuted and displaced right intertrochanteric fracture. No right hip dislocation. No acute displaced fracture or dislocation of the left hip. No acute displaced fracture or diastasis of the bones of the pelvis. Degenerative changes of the visualized lower lumbar spine. L5-S1 surgical hardware noted. Sacrum is grossly unremarkable - limited evaluation due to overlapping osseous structures and overlying soft tissues. SOFT TISSUES: The soft tissues are unremarkable. IMPRESSION: 1. Acute comminuted and displaced right intertrochanteric fracture. Electronically signed by: Morgane Naveau MD 10/21/2023 08:31 PM EDT RP Workstation: [id number]   DG Chest Port 1 View Result Date: 10/21/2023 EXAM:  1 VIEW XRAY OF THE CHEST 10/21/2023 08:18:00 PM COMPARISON: CT chest dated 12/21/2017. CLINICAL HISTORY: Trauma. fall. FINDINGS: LUNGS AND PLEURA: Chronic coarsened interstitial markings with no overt pulmonary edema. No focal consolidation. No pleural effusion. No pneumothorax. HEART AND MEDIASTINUM: Unchanged cardiomediastinal silhouette. Atherosclerotic plaque. Surgical changes overlies the mediastinum. BONES AND SOFT TISSUES: Intact sternotomy wires. No acute osseous abnormality. IMPRESSION: 1. No acute cardiopulmonary process identified. Electronically signed by: Morgane Naveau MD 10/21/2023 08:29 PM EDT RP Workstation: [id number]     Assessment and Plan:  Zhana L  Dibiasio is a 83 y.o. female with a hx of hypertension, hyperlipidemia, diabetes, diastolic heart failure, mild LVH, prior CVA, history of falls, GERD, carotid artery disease s/p left CAE in 2015, right CAE in 2018, and CAD s/p CABG in 1997 who is being seen 10/22/2023 for a preop evaluation at the request of Andrea Monte MD.  Mechanical fall Comminuted right intertrochanteric femur fracture Patient presented to the emergency department with right hip pain following a mechanical fall.  Imaging showed a displaced right intertrochanteric hip fracture.  Orthopedic surgery saw the patient and is recommending a surgical stabilization Patient has an RCRI score of 5 (high risk, history of ischemic heart disease, history of CHF, history of CVA, and insulin ) this gives her a 10% risk of a major cardiac event. Duke activity index: Patient is unable to do more than 4 metabolic equivalents of exertion. Patient has an cardiac elevated risk but also has a hip fracture that needs to be stabilized.  This is a difficult situation as there is significant risk involved with the procedure and significant risk involved with not undergoing the procedure.  Patient appears to be optimized from a cardiac perspective. Echo pending -was seen by Dr. Kate:  severe PH, normal LV and RV systolic function Dr. Kate called and spoke with the patient's family and they are in agreement that it is best for the patient to proceed.  No further cardiac workup recommended prior to surgery  CAD s/p CABG in 1997 Carotid artery disease Hyperlipidemia Diastolic heart failure Prior CVA Hypertension Severe pulmonary hypertension Increases patient's cardiac risk of adverse event Management per primary  Otherwise management per primary     Risk Assessment/Risk Scores:   For questions or updates, please contact  [facility] Please consult [email or web address] for contact info under   Signed, Morse Clause, PA-C  10/22/2023 11:14 AM   Patient seen and examined.  Agree with above documentation.  Andrea Kirk is an 83 year old female with history of CAD status post CABG in 1997, T2DM, chronic diastolic heart failure, pulmonary hypertension, CVA who we are consulted for preop evaluation at the request of Dr. Monte.  Most recent echocardiogram 02/2021 showed EF 50 to 55%, normal RV function, severe pulmonary hypertension (RVSP 60 mmHg).  She reports she is not very active.  Most activity she does is walking around her facility.  She denies any chest pain or dyspnea.  She presented after a fall, found to have hip fracture and planning surgery today.  EKG shows sinus rhythm, rate 68, right bundle branch block.  Labs showed creatinine 1.2, hemoglobin 9.8, procalcitonin negative, troponin 7.  Head CT unremarkable.  CT chest abdomen pelvis showed right intertrochanteric femur fracture, small right lower lobe airspace disease.  On exam, patient is alert and oriented, regular rate and rhythm, 2/6 systolic murmur, lungs CTAB, no LE edema.  Her echocardiogram today shows EF greater than 75%, G2 DD, normal RV function, severely elevated pulmonary pressures, right atrial pressure 3 mmHg.  Overall, she is a high risk surgical candidate given her severe pulmonary hypertension.   However she currently appears optimized, she is euvolemic on exam.  In addition there is risk with not Kirk surgery, as losing her mobility would significantly impact her quality of life.  This was discussed with patient and her brother (who is a retired Development worker, community) and both recognize that while she is a high risk surgical candidate, they are accepting of these risks and would like to proceed with surgery.  No further cardiac  workup recommended prior to surgery  Lonni LITTIE Nanas, MD

## 2023-10-22 NOTE — Inpatient Diabetes Management (Signed)
 Inpatient Diabetes Program Recommendations  AACE/ADA: New Consensus Statement on Inpatient Glycemic Control   Target Ranges:  Prepandial:   less than 140 mg/dL      Peak postprandial:   less than 180 mg/dL (1-2 hours)      Critically ill patients:  140 - 180 mg/dL    Latest Reference Range & Units 10/21/23 23:29 10/22/23 04:53 10/22/23 08:48  Glucose-Capillary 70 - 99 mg/dL 742 (H) 799 (H) 740 (H)   Review of Glycemic Control  Diabetes history: DM2 Outpatient Diabetes medications: Glipizide  XL 5 mg QAM, Jardiance 25 mg daily Current orders for Inpatient glycemic control: Novolog  0-9 units Q4H  Inpatient Diabetes Program Recommendations:    Insulin : Please consider ordering Lantus 10 units Q24H.  Thanks, Earnie Gainer, RN, MSN, CDCES Diabetes Coordinator Inpatient Diabetes Program [phone] (Team Pager from 8am to 5pm)

## 2023-10-22 NOTE — Discharge Instructions (Signed)
 Orthopaedic Discharge Instructions   General Discharge Instructions  WEIGHT BEARING STATUS: Weightbearing as tolerated  RANGE OF MOTION/ACTIVITY: No restrictions  Wound Care: You may remove your surgical dressing on 10-18/25. Incisions can be left open to air if there is no drainage. Once the incision is completely dry and without drainage, it may be left open to air out.  Showering may begin 10/25/2023.  Clean incision gently with soap and water.  DVT/PE prophylaxis: Lovenox   Diet: as you were eating previously.  Can use over the counter stool softeners and bowel preparations, such as Miralax, to help with bowel movements.  Narcotics can be constipating.  Be sure to drink plenty of fluids  PAIN MEDICATION USE AND EXPECTATIONS  You have likely been given narcotic medications to help control your pain.  After a traumatic event that results in an fracture (broken bone) with or without surgery, it is ok to use narcotic pain medications to help control one's pain.  We understand that everyone responds to pain differently and each individual patient will be evaluated on a regular basis for the continued need for narcotic medications. Ideally, narcotic medication use should last no more than 6-8 weeks (coinciding with fracture healing).   As a patient it is your responsibility as well to monitor narcotic medication use and report the amount and frequency you use these medications when you come to your office visit.   We would also advise that if you are using narcotic medications, you should take a dose prior to therapy to maximize you participation.  IF YOU ARE ON NARCOTIC MEDICATIONS IT IS NOT PERMISSIBLE TO OPERATE A MOTOR VEHICLE (MOTORCYCLE/CAR/TRUCK/MOPED) OR HEAVY MACHINERY DO NOT MIX NARCOTICS WITH OTHER CNS (CENTRAL NERVOUS SYSTEM) DEPRESSANTS SUCH AS ALCOHOL   POST-OPERATIVE OPIOID TAPER INSTRUCTIONS: It is important to wean off of your opioid medication as soon as possible. If you do not  need pain medication after your surgery it is ok to stop day one. Opioids include: Codeine, Hydrocodone (Norco, Vicodin), Oxycodone (Percocet, oxycontin ) and hydromorphone  amongst others.  Long term and even short term use of opiods can cause: Increased pain response Dependence Constipation Depression Respiratory depression And more.  Withdrawal symptoms can include Flu like symptoms Nausea, vomiting And more Techniques to manage these symptoms Hydrate well Eat regular healthy meals Stay active Use relaxation techniques(deep breathing, meditating, yoga) Do Not substitute Alcohol  to help with tapering If you have been on opioids for less than two weeks and do not have pain than it is ok to stop all together.  Plan to wean off of opioids This plan should start within one week post op of your fracture surgery  Maintain the same interval or time between taking each dose and first decrease the dose.  Cut the total daily intake of opioids by one tablet each day Next start to increase the time between doses. The last dose that should be eliminated is the evening dose.    STOP SMOKING OR USING NICOTINE PRODUCTS!!!!  As discussed nicotine severely impairs your body's ability to heal surgical and traumatic wounds but also impairs bone healing.  Wounds and bone heal by forming microscopic blood vessels (angiogenesis) and nicotine is a vasoconstrictor (essentially, shrinks blood vessels).  Therefore, if vasoconstriction occurs to these microscopic blood vessels they essentially disappear and are unable to deliver necessary nutrients to the healing tissue.  This is one modifiable factor that you can do to dramatically increase your chances of healing your injury.  (This means no smoking, no nicotine gum,  patches, etc)  DO NOT USE NONSTEROIDAL ANTI-INFLAMMATORY DRUGS (NSAID'S)  Using products such as Advil  (ibuprofen ), Aleve (naproxen), Motrin  (ibuprofen ) for additional pain control during fracture  healing can delay and/or prevent the healing response.  If you would like to take over the counter (OTC) medication, Tylenol  (acetaminophen ) is ok.  However, some narcotic medications that are given for pain control contain acetaminophen  as well. Therefore, you should not exceed more than 4000 mg of tylenol  in a day if you do not have liver disease.  Also note that there are may OTC medicines, such as cold medicines and allergy medicines that my contain tylenol  as well.  If you have any questions about medications and/or interactions please ask your doctor/PA or your pharmacist.      ICE AND ELEVATE INJURED/OPERATIVE EXTREMITY  Using ice and elevating the injured extremity above your heart can help with swelling and pain control.  Icing in a pulsatile fashion, such as 20 minutes on and 20 minutes off, can be followed.    Do not place ice directly on skin. Make sure there is a barrier between to skin and the ice pack.    Using frozen items such as frozen peas works well as the conform nicely to the are that needs to be iced.  USE AN ACE WRAP OR TED HOSE FOR SWELLING CONTROL  In addition to icing and elevation, Ace wraps or TED hose are used to help limit and resolve swelling.  It is recommended to use Ace wraps or TED hose until you are informed to stop.    When using Ace Wraps start the wrapping distally (farthest away from the body) and wrap proximally (closer to the body)   Example: If you had surgery on your leg or thing and you do not have a splint on, start the ace wrap at the toes and work your way up to the thigh        If you had surgery on your upper extremity and do not have a splint on, start the ace wrap at your fingers and work your way up to the upper arm     CALL THE OFFICE FOR MEDICATION REFILLS OR WITH ANY QUESTIONS/CONCERNS: [phone]      Discharge Wound Care Instructions  Do NOT apply any ointments, solutions or lotions to pin sites or surgical wounds.  These prevent  needed drainage and even though solutions like hydrogen peroxide kill bacteria, they also damage cells lining the pin sites that help fight infection.  Applying lotions or ointments can keep the wounds moist and can cause them to breakdown and open up as well. This can increase the risk for infection. When in doubt call the office.  Surgical incisions should be dressed daily.  If any drainage is noted, use one layer of adaptic or Mepitel, then gauze, Kerlix, and an ace wrap. - These dressing supplies should be available at local medical supply stores ([facility], [facility], etc) as well as Insurance claims handler ([facility], [facility], [place], etc)  Once the incision is completely dry and without drainage, it may be left open to air out.  Showering may begin 36-48 hours later.  Cleaning gently with soap and water.  Traumatic wounds should be dressed daily as well.    One layer of adaptic, gauze, Kerlix, then ace wrap.  The adaptic can be discontinued once the draining has ceased    If you have a wet to dry dressing: wet the gauze with saline the squeeze as  much saline out so the gauze is moist (not soaking wet), place moistened gauze over wound, then place a dry gauze over the moist one, followed by Kerlix wrap, then ace wrap.    Call office for the following: Temperature greater than 101F Persistent nausea and vomiting Severe uncontrolled pain Redness, tenderness, or signs of infection (pain, swelling, redness, odor or green/yellow discharge around the site) Difficulty breathing, headache or visual disturbances Hives Persistent dizziness or light-headedness Extreme fatigue Any other questions or concerns you may have after discharge  In an emergency, call 911 or go to an Emergency Department at a nearby hospital  OTHER HELPFUL INFORMATION  If you had a block, it will wear off between 8-24 hrs postop typically.  This is period when your pain may go from nearly zero to the pain you would  have had postop without the block.  This is an abrupt transition but nothing dangerous is happening.  You may take an extra dose of narcotic when this happens.  You should wean off your narcotic medicines as soon as you are able.  Most patients will be off or using minimal narcotics before their first postop appointment.   We suggest you use the pain medication the first night prior to going to bed, in order to ease any pain when the anesthesia wears off. You should avoid taking pain medications on an empty stomach as it will make you nauseous.  Do not drink alcoholic beverages or take illicit drugs when taking pain medications.  In most states it is against the law to drive while you are in a splint or sling.  And certainly against the law to drive while taking narcotics.  You may return to work/school in the next couple of days when you feel up to it.   Pain medication may make you constipated.  Below are a few solutions to try in this order: Decrease the amount of pain medication if you aren't having pain. Drink lots of decaffeinated fluids. Drink prune juice and/or each dried prunes  If the first 3 don't work start with additional solutions Take Colace - an over-the-counter stool softener Take Senokot - an over-the-counter laxative Take Miralax - a stronger over-the-counter laxative   Follow up with Dr Reyne in 2 weeks  Cordella Reyne, MD, MS [facility] [phone]

## 2023-10-22 NOTE — Progress Notes (Signed)
 Patient arrived to unit from ED at approximately 0145 in stable condition. Patient asking for pain medication upon arrival. Cardiac monitoring initiated, box 07. Patient states that she has spoken with her brother and does not wish for staff to contact him at this time. Clothing, shoes, cellphone and cellphone charger kept at bedside per patient request. External urinary catheter placed with MD order.

## 2023-10-22 NOTE — Plan of Care (Signed)
   Problem: Coping: Goal: Ability to adjust to condition or change in health will improve Outcome: Progressing   Problem: Fluid Volume: Goal: Ability to maintain a balanced intake and output will improve Outcome: Progressing

## 2023-10-22 NOTE — Anesthesia Preprocedure Evaluation (Signed)
 Anesthesia Evaluation  Patient identified by MRN, date of birth, ID band Patient awake    Reviewed: Allergy & Precautions, NPO status , Patient's Chart, lab work & pertinent test results  Airway Mallampati: II  TM Distance: >3 FB Neck ROM: Full    Dental  (+) Dental Advisory Given   Pulmonary shortness of breath, COPD,  COPD inhaler and oxygen  dependent   breath sounds clear to auscultation       Cardiovascular hypertension, Pt. on medications + CAD, + CABG and +CHF   Rhythm:Regular Rate:Normal     Neuro/Psych  Neuromuscular disease CVA    GI/Hepatic Neg liver ROS,GERD  ,,  Endo/Other  diabetes, Type 2    Renal/GU Renal InsufficiencyRenal disease     Musculoskeletal   Abdominal   Peds  Hematology  (+) Blood dyscrasia, anemia   Anesthesia Other Findings   Reproductive/Obstetrics                              Anesthesia Physical Anesthesia Plan  ASA: 4  Anesthesia Plan: General   Post-op Pain Management:    Induction: Intravenous  PONV Risk Score and Plan: 3 and Dexamethasone, Ondansetron  and Treatment may vary due to age or medical condition  Airway Management Planned: Oral ETT  Additional Equipment:   Intra-op Plan:   Post-operative Plan: Extubation in OR  Informed Consent: I have reviewed the patients History and Physical, chart, labs and discussed the procedure including the risks, benefits and alternatives for the proposed anesthesia with the patient or authorized representative who has indicated his/her understanding and acceptance.     Dental advisory given  Plan Discussed with: CRNA  Anesthesia Plan Comments:         Anesthesia Quick Evaluation

## 2023-10-22 NOTE — Anesthesia Postprocedure Evaluation (Signed)
 Anesthesia Post Note  Patient: Andrea Kirk  Procedure(s) Performed: FIXATION, FRACTURE, INTERTROCHANTERIC, WITH INTRAMEDULLARY ROD (Right: Leg Upper)     Patient location during evaluation: PACU Anesthesia Type: General Level of consciousness: awake (pt oriented to self) Pain management: pain level controlled Vital Signs Assessment: post-procedure vital signs reviewed and stable Respiratory status: spontaneous breathing, nonlabored ventilation and respiratory function stable Cardiovascular status: blood pressure returned to baseline and stable Postop Assessment: no apparent nausea or vomiting Anesthetic complications: no   No notable events documented.  Last Vitals:  Vitals:   10/22/23 1608 10/22/23 1613  BP: (!) 104/56 (!) 122/52  Pulse: 74   Resp: 15   Temp: 37.1 C   SpO2: 100%     Last Pain:  Vitals:   10/22/23 1608  TempSrc: Axillary  PainSc:                  Sura Canul,E. Rateel Beldin

## 2023-10-22 NOTE — Consult Note (Signed)
 Orthopedic Consult  Patient ID: Andrea Kirk MRN: [id number] DOB/AGE: Jan 31, 1940 83 y.o.  Reason for Consult: Right hip pain Referring Physician: Perri   HPI: Andrea Kirk is an 83 y.o. female who fell yesterday injuring her right hip.  She denies any other injuries or results of the fall.  She denies any antecedent right hip pain.  She denies any loss of consciousness.  Past Medical History:  Diagnosis Date   Acute kidney injury 08/05/2016   Aftercare following surgery of the circulatory system, NEC 05/11/2013   AKI (acute kidney injury) 08/04/2016   Anxiety    takes Celexa  daily   ARF (acute renal failure) 11/07/2016   Asymptomatic stenosis of right carotid artery 03/22/2016   Back pain    occasionally   Carotid artery disease 04/16/2013   Carotid artery occlusion    Carotid stenosis 11/16/2013   Cataract    left and immature   Coronary artery disease    Coronary atherosclerosis of native coronary artery 03/11/2013   S/p CABG in 1997    Depression    Diabetes mellitus    takes Metformin  and Glipizide  daily   Diabetes mellitus (HCC) 05/02/2015   Dizziness    takes Meclizine  daily as needed   Essential hypertension, benign 03/11/2013   GERD (gastroesophageal reflux disease)    takes Omeprazole daily as needed   Headache(784.0)    Hyperlipidemia    takes Atorvastatin  daily   Hypertension    takes Carvedilol  daily   Mixed hyperlipidemia 03/11/2013   Muscle spasm    takes Robaxin  daily as needed   Nausea    takes Phenergan  daily as needed   Occlusion and stenosis of carotid artery without mention of cerebral infarction 07/19/2011   Pneumonia    hx of-in high school   Restless leg    takes Requip  daily as needed   Seasonal allergies    takes Claritin  daily as needed and Afrin as needed   Shortness of breath    with exertion   Urinary urgency    UTI (urinary tract infection) 11/07/2016    Past Surgical History:  Procedure Laterality Date   COLONOSCOPY WITH PROPOFOL  N/A  03/10/2012   Procedure: COLONOSCOPY WITH PROPOFOL ;  Surgeon: Gladis MARLA Louder, MD;  Location: [facility];  Service: Endoscopy;  Laterality: N/A;   CORNEAL TRANSPLANT Right    CORONARY ARTERY BYPASS GRAFT  1997   x 6   CORONARY ARTERY BYPASS GRAFT  Jan. 1997   ENDARTERECTOMY Left 04/16/2013   Procedure: Left Carotid Artery Endatarectomy with Resection of Redundant Internal Carotid Artery;  Surgeon: Krystal JULIANNA Doing, MD;  Location: [facility] OR;  Service: Vascular;  Laterality: Left;   ENDARTERECTOMY Right 03/22/2016   Procedure: RIGHT ENDARTERECTOMY CAROTID;  Surgeon: Krystal JULIANNA Doing, MD;  Location: [facility] OR;  Service: Vascular;  Laterality: Right;   ESOPHAGOGASTRODUODENOSCOPY N/A 03/10/2012   Procedure: ESOPHAGOGASTRODUODENOSCOPY (EGD);  Surgeon: Gladis MARLA Louder, MD;  Location: [facility];  Service: Endoscopy;  Laterality: N/A;   EYE SURGERY  March 12, 2001   CORNEA TRANSPLANT Right eye   PATCH ANGIOPLASTY Right 03/22/2016   Procedure: PATCH ANGIOPLASTY;  Surgeon: Krystal JULIANNA Doing, MD;  Location: [facility] OR;  Service: Vascular;  Laterality: Right;   PR VEIN BYPASS GRAFT,AORTO-FEM-POP  1997   SPINE SURGERY  march 2013   Back surgery   TONSILLECTOMY     TRIGGER FINGER RELEASE Left    thumb    Family History  Problem Relation Age of Onset  Heart attack Father    Heart disease Father 13       Before age of 77   Hyperlipidemia Father    Heart disease Brother        Heart dissease before age 76   Hyperlipidemia Brother    Cirrhosis Mother    Heart attack Paternal Grandfather    Heart disease Paternal Grandfather    Cancer Maternal Grandmother    Alcoholism Maternal Grandfather    Heart attack Paternal Grandmother     Social History:  reports that she has never smoked. She has never used smokeless tobacco. She reports that she does not drink alcohol  and does not use drugs.  Allergies:  Allergies  Allergen Reactions   Latex Other (See Comments), Rash and Hives    tears skin   Codeine Other (See Comments)     Abnormal behavior   Morphine  And Codeine Other (See Comments)    Affects BP and blood sugar.   Cyclobenzaprine Other (See Comments)    MADE PT SICK- pt unsure if med was cyclobenzaprine or methocarbamol     Medications: I have reviewed the patient's current medications.     Exam: Blood pressure (!) 131/51, pulse 81, temperature 98.1 F (36.7 C), temperature source Oral, resp. rate 15, height 5' 2 (1.575 m), weight 84.1 kg, SpO2 94%. General: Well-appearing woman resting in bed.  She is somewhat sleepy but arousable and communicative with exam Orientation: Somewhat drowsy but communicative Mood and Affect: Mood is calm  Injured Extremity (CV, lymph, sensation, reflexes): Right leg is shortened and externally rotated.  There is tense about the hip.  No tense with the knee or ankle.  Intact station the saphenous, sural, tibial, and peroneal nerve distributions.  5 out of 5 strength EHL FHL gastrocs tibialis anterior.  Examination of the bilateral upper extremities and left lower extremity shows no tenderness patient no crepitus defects deformities     Medical Decision Making: Data: Imaging: AP pelvis and views of the right hip show a displaced right intertrochanteric hip fracture  Labs: White cell count 8.8, hemoglobin 9.8, hematocrit 30.1, INR 1.0, HBA1C7.5  Imaging or Labs ordered: None  Medical history and chart was reviewed and Zender discussed with medical provider.  Assessment/Plan: Right trochanteric hip fracture  The patient does have a right i intertrochanteric hip fracture.  This require surgical stabilization.  We discussed that the goals of surgery to stabilize the fracture, provide for early mobilization prevent the morbidity associated prolonged mobilization including bedsores pneumonias and blood clots.  We discussed the risks of surgery include not limited to bleeding, infection, injury to nerves or tendons, nonunion, and Hardware failure, risk of anesthesia,  risk of blood clots, need for additional surgeries.  We discussed that after surgery typically patients are made weightbearing as tolerated but this depends on how things look during the surgical procedure.  We discussed typically patients do the rehab afterwards.  We also discussed the use of blood thinners after risk of blood clots.  Will plan for surgery later on today.  All questions were answered discussed and appropriate was obtained.  New problem w/ workup planned:   High complexity diagnosis (Level 5) Surgery w/ risks or Emergency surgery: High complexity Risk (Level 5)  All others are Level 4 with comprehensive musculoskeletal exam.  Cordella Rhein, MD, MS [facility] [phone]  Right hip pain

## 2023-10-22 NOTE — Anesthesia Procedure Notes (Signed)
 Procedure Name: Intubation Date/Time: 10/22/2023 1:38 PM  Performed by: Jerrye Herring, CRNAPre-anesthesia Checklist: Patient identified, Emergency Drugs available, Suction available and Patient being monitored Patient Re-evaluated:Patient Re-evaluated prior to induction Oxygen  Delivery Method: Circle System Utilized Preoxygenation: Pre-oxygenation with 100% oxygen  Induction Type: IV induction Ventilation: Mask ventilation without difficulty Laryngoscope Size: Glidescope and 3 Grade View: Grade I Tube type: Oral Tube size: 7.5 mm Number of attempts: 1 Airway Equipment and Method: Stylet and Oral airway Placement Confirmation: ETT inserted through vocal cords under direct vision, positive ETCO2 and breath sounds checked- equal and bilateral Secured at: 22 cm Tube secured with: Tape Dental Injury: Teeth and Oropharynx as per pre-operative assessment  Comments: Atraumatic Intubation

## 2023-10-22 NOTE — Op Note (Signed)
 DATE OF SURGERY:  10/22/2023  TIME: 2:47 PM  PATIENT NAME:  Andrea Kirk  AGE: 83 y.o.  PRE-OPERATIVE DIAGNOSIS:  right hip fracture  POST-OPERATIVE DIAGNOSIS:  SAME  PROCEDURE:  FIXATION, FRACTURE, INTERTROCHANTERIC, WITH INTRAMEDULLARY ROD  SURGEON:  Cordella SHAUNNA Rhein  ASSISTANT: None  OPERATIVE IMPLANTS:  Implant Name Type Inv. Item Serial No. Manufacturer Lot No. LRB No. Used Action  NAIL LESTER GAILS [id number] - [id number] Nail NAIL LESTER GAILS [employer] [id number] Right 1 Implanted  SCREW LAG COMPR KIT 100/95 - [id number] Screw SCREW LAG COMPR KIT 100/95  [employer] [id number] Right 1 Implanted  SCREW TRIGEN LOW PROF 5.0X35 - [id number] Screw SCREW TRIGEN LOW PROF 5.0X35  [employer] [id number] Right 1 Implanted    UNIQUE ASPECTS OF THE [place]: None  ESTIMATED BLOOD LOSS: 100  PREOPERATIVE INDICATIONS:  Andrea Kirk is a 83 y.o. year old who fell and suffered an right hip fracture. She was brought into the ER and then admitted and optimized and then elected for surgical intervention.    The risks benefits and alternatives were discussed with the patient including but not limited to the risks of nonoperative treatment, versus surgical intervention including infection, bleeding, nerve injury, malunion, nonunion, hardware prominence, hardware failure, need for hardware removal, blood clots, cardiopulmonary complications, morbidity, mortality, among others, and they were willing to proceed.    OPERATIVE PROCEDURE:  The patient was brought to the operating room and placed in the supine position. Anesthesia was administered. She was placed on the fracture table.  Closed reduction was performed under C-arm guidance.  Time out was then performed after sterile prep and drape. She received preoperative antibiotics.  Small incision proximal to the greater trochanter was made and carried down through skin and subcutaneous  tissue.  Threaded guidewire was directed at the tip of the greater trochanter and advanced into the proximal metaphysis.  Positioning was confirmed with fluoroscopy.  I then used an entry reamer to enter the medullary canal.  I then passed a 10 x 180 mm InterTAN down the center of the canal attached to the targeting arm.  I then used the targeting arm to make a percutaneous incision and directed a threaded guidewire up into the head/neck segment.  I confirmed adequate tip apex distance and measured the length.  I decided to place a 100 mm screw.  I then drilled the path for the compression screw and placed an antirotation bar.  I then placed the lag screw and then placed the compression screw and compressed approximately 5 mm.  The proximal portion of the nail was statically locked.   I used the targeting arm to place a distal interlocking screw.  The targeting arm was removed.  Final fluoroscopic imaging was obtained.    The wounds were irrigated copiously, and vancomycin  powder was placed in the wounds.  The gluteal fascia was closed with #1 Vicryl, and skin was closed with 2-0 Vicryl and 3-0 Monocryl.  Sterile dressing was applied with Dermabond, 4 x 4 gauze, and Tegaderm.  The patient was awakened and returned to PACU in stable and satisfactory condition. There were no complications and the patient tolerated the procedure well.   Post op recs: WB: WBAT right LE Abx: ancef  x23 hours post op Dressing: keep intact until follow up, change PRN if soiled or saturated. DVT prophylaxis: lovenox  starting POD1 x4 weeks Follow up: 2 weeks after surgery for a wound check with Dr.  Reyne at [facility].  Address: [address], [place], [place] [address]  Office Phone: [phone]  Cathlyn Reyne, MD Orthopaedic Surgery

## 2023-10-22 NOTE — Transfer of Care (Signed)
 Immediate Anesthesia Transfer of Care Note  Patient: Andrea Kirk  Procedure(s) Performed: FIXATION, FRACTURE, INTERTROCHANTERIC, WITH INTRAMEDULLARY ROD (Right: Leg Upper)  Patient Location: PACU  Anesthesia Type:General  Level of Consciousness: drowsy and responds to stimulation  Airway & Oxygen  Therapy: Patient Spontanous Breathing and Patient connected to face mask oxygen   Post-op Assessment: Report given to RN and Post -op Vital signs reviewed and stable  Post vital signs: Reviewed and stable  Last Vitals:  Vitals Value Taken Time  BP 116/51 10/22/23 15:00  Temp 37.6 C 10/22/23 14:55  Pulse 72 10/22/23 15:02  Resp 18 10/22/23 15:02  SpO2 100 % 10/22/23 15:02  Vitals shown include unfiled device data.  Last Pain:  Vitals:   10/22/23 1233  TempSrc:   PainSc: 10-Worst pain ever         Complications: No notable events documented.

## 2023-10-22 NOTE — Progress Notes (Signed)
 PROGRESS NOTE    [name]  FMW:[id number] DOB: 1940-06-10 DOA: 10/21/2023 PCP: System, Provider Not In  Chief Complaint  Patient presents with   Fall    Brief Narrative:   Andrea Kirk is [name] 83 y.o. female with medical history significant of Dm2.  Carotid artery stenosis, CAD status post CABG in 1997, depression hypertension GERD HLD restless leg, chronic diastolic CHF multiple falls, hx of stroke here after [name] fall with [name] R intertrochanteric femur fracture.      Assessment & Plan:   Principal Problem:   Closed right hip fracture [facility]) Active Problems:   Coronary artery disease   Carotid artery disease   Chronic diastolic CHF (congestive heart failure) (HCC)   hx of CVA (cerebral vascular accident) (HCC)   Pulmonary hypertension, unspecified (HCC)   Controlled type 2 diabetes mellitus with hyperglycemia (HCC)   Essential hypertension   HLD (hyperlipidemia)   Iron deficiency anemia   Chronic respiratory failure with hypoxia (HCC)   Preop examination  Acute Comminuted and Displaced R Intertrochanteric Femur Fracture 10/15 s/p surgery with orthopedics  WBAT Post op care per ortho   Pending therapy eval  CAD s/p CABG 1997 Hx CVA Dyslipidemia Crestor  Aspirin /brilinta  currently on hold - looks like she's been on ASA/brilinta  for [name] few years, appears this was from 04/2020 stroke? Will need to clarify if ongoing indication for DAPT Continue coreg  at reduced dose  HFpEF Pulmonary Hypertension Appears euvolemic Will follow, echo with EF >75%, severely elevated PASP Coreg  reduced dose, jardiance on hold Holding lasix  for now  Hypertension Reduced dose coreg   Hold arb   T2DM Holding glipizide  SSI A1c 7.5  GERD PPI  Depression  Anxiety Celexa , buspar     DVT prophylaxis: loveno Code Status: full Family Communication: none Disposition:   Status is: Inpatient Remains inpatient appropriate because: need for post op care   Consultants:   Cardiology orthopedics  Procedures:  10/15 FIXATION, FRACTURE, INTERTROCHANTERIC, WITH INTRAMEDULLARY ROD   Echo IMPRESSIONS     1. Left ventricular ejection fraction, by estimation, is >75%. The left  ventricle has hyperdynamic function. The left ventricle has no regional  wall motion abnormalities. Left ventricular diastolic parameters are  consistent with Grade II diastolic  dysfunction (pseudonormalization).   2. Right ventricular systolic function is normal. The right ventricular  size is normal. There is severely elevated pulmonary artery systolic  pressure.   3. Left atrial size was moderately dilated.   4. Right atrial size was mildly dilated.   5. The mitral valve is normal in structure. Trivial mitral valve  regurgitation. No evidence of mitral stenosis.   6. The aortic valve is tricuspid. Aortic valve regurgitation is not  visualized. Mild aortic valve stenosis.   7. Aortic dilatation noted. There is borderline dilatation of the  ascending aorta, measuring 39 mm.   8. The inferior vena cava is normal in size with greater than 50%  respiratory variability, suggesting right atrial pressure of 3 mmHg.   Antimicrobials:  Anti-infectives (From admission, onward)    Start     Dose/Rate Route Frequency Ordered Stop   10/23/23 0600  ceFAZolin  (ANCEF ) IVPB 2g/100 mL premix        2 g 200 mL/hr over 30 Minutes Intravenous On call to O.R. 10/22/23 1212 10/22/23 1408   10/22/23 2000  ceFAZolin  (ANCEF ) IVPB 2g/100 mL premix        2 g 200 mL/hr over 30 Minutes Intravenous Every 6 hours 10/22/23 1642 10/23/23 1359  Subjective: C/o post op pain   Objective: Vitals:   10/22/23 1530 10/22/23 1545 10/22/23 1608 10/22/23 1613  BP: (!) 118/51 (!) 107/55 (!) 104/56 (!) 122/52  Pulse: 70 71 74   Resp: 14 15 15    Temp:  98.9 F (37.2 C) 98.8 F (37.1 C)   TempSrc:   Axillary   SpO2: 98% 98% 100%   Weight:      Height:        Intake/Output Summary (Last 24  hours) at 10/22/2023 1938 Last data filed at 10/22/2023 1441 Gross per 24 hour  Intake 1250 ml  Output 475 ml  Net 775 ml   Filed Weights   10/21/23 2013 10/21/23 2039  Weight: 80.7 kg 84.1 kg    Examination:  General exam: Appears calm and comfortable  Respiratory system: unlabored Cardiovascular system: RRR Gastrointestinal system: Abdomen is nondistended, soft and nontender Central nervous system: Alert and oriented. No focal neurological deficits. Extremities: RLE with dressing intact    Data Reviewed: I have personally reviewed following labs and imaging studies  CBC: Recent Labs  Lab 10/21/23 2020 10/21/23 2024 10/22/23 0442  WBC  --  6.1 8.6  HGB 11.6* 10.4* 9.8*  HCT 34.0* 31.9* 30.1*  MCV  --  107.4* 104.9*  PLT  --  189 166    Basic Metabolic Panel: Recent Labs  Lab 10/21/23 2020 10/21/23 2024 10/22/23 0442  NA 135 134* 134*  K 4.5 4.4 3.9  CL 92* 92* 94*  CO2  --  32 29  GLUCOSE 334* 339* 186*  BUN 52* 49* 51*  CREATININE 1.30* 1.29* 1.23*  CALCIUM   --  8.9 9.1  MG  --  2.2  --   PHOS  --  4.1  --     GFR: Estimated Creatinine Clearance: 35.5 mL/min ([name]) (by C-G formula based on SCr of 1.23 mg/dL (H)).  Liver Function Tests: Recent Labs  Lab 10/21/23 2024 10/22/23 0442  AST 63* 138*  ALT 52* 86*  ALKPHOS 115 107  BILITOT 0.8 0.8  PROT 7.1 6.8  ALBUMIN 3.4* 3.5    CBG: Recent Labs  Lab 10/22/23 1152 10/22/23 1310 10/22/23 1432 10/22/23 1459 10/22/23 1614  GLUCAP 278* 239* 228* 209* 206*     Recent Results (from the past 240 hours)  Surgical PCR screen     Status: Abnormal   Collection Time: 10/22/23  3:54 AM   Specimen: Nasal Mucosa; Nasal Swab  Result Value Ref Range Status   MRSA, PCR NEGATIVE NEGATIVE Final   Staphylococcus aureus POSITIVE ([name]) NEGATIVE Final    Comment: (NOTE) The Xpert SA Assay (FDA approved for NASAL specimens in patients 90 years of age and older), is one component of Chelesea Weiand  comprehensive surveillance program. It is not intended to diagnose infection nor to guide or monitor treatment. Performed at [facility], [address] [place], [place] [address]          Radiology Studies: DG FEMUR, AL[place] 2 VIEWS RIGHT Result Date: 10/22/2023 CLINICAL DATA:  Intraoperative fluoroscopic images of proximal right femoral fracture. EXAM: DG FEMUR 2+V*R* COMPARISON:  None Available. FINDINGS: Multiple intraoperative fluoroscopic spot images are provided. Interval open reduction and proximal femoral nail fixation of Nicandro Perrault comminuted right intertrochanteric femur fracture with improved alignment. Total fluoroscopy time: 1 minute and 23 seconds Total dose: Radiation Exposure Index (as provided by the fluoroscopic device): 29.16 mGy air Kerma Please see intraoperative findings for further detail. IMPRESSION: Intraoperative fluoroscopic spot images of proximal  femoral nail fixation of Yahshua Thibault comminuted right intertrochanteric femur fracture with improved alignment. Electronically Signed   By: Harrietta Sherry M.D.   On: 10/22/2023 18:26   DG C-Arm 1-60 Min-No Report Result Date: 10/22/2023 Fluoroscopy was utilized by the requesting physician.  No radiographic interpretation.   ECHOCARDIOGRAM COMPLETE Result Date: 10/22/2023    ECHOCARDIOGRAM REPORT   Patient Name:   Andrea Kirk Date of Exam: 10/22/2023 Medical Rec #:  [id number]     Height:       62.0 in Accession #:    [id number]    Weight:       185.4 lb Date of Birth:  [birth year]      BSA:          1.851 m Patient Age:    83 years      BP:           131/51 mmHg Patient Gender: F             HR:           82 bpm. Exam Location:  Inpatient Procedure: 2D Echo, Color Doppler and Cardiac Doppler (Both Spectral and Color            Flow Doppler were utilized during procedure). Indications:    Pre Op Z01.810  History:        Patient has prior history of Echocardiogram examinations, most                 recent 02/15/2021. CAD, Stroke,  Signs/Symptoms:Syncope; Risk                 Factors:Diabetes and Hypertension. H/O Hyperlipidemia.  Sonographer:    BERNARDA ROCKS Referring Phys: Domingo.Diones ANASTASSIA DOUTOVA IMPRESSIONS  1. Left ventricular ejection fraction, by estimation, is >75%. The left ventricle has hyperdynamic function. The left ventricle has no regional wall motion abnormalities. Left ventricular diastolic parameters are consistent with Grade II diastolic dysfunction (pseudonormalization).  2. Right ventricular systolic function is normal. The right ventricular size is normal. There is severely elevated pulmonary artery systolic pressure.  3. Left atrial size was moderately dilated.  4. Right atrial size was mildly dilated.  5. The mitral valve is normal in structure. Trivial mitral valve regurgitation. No evidence of mitral stenosis.  6. The aortic valve is tricuspid. Aortic valve regurgitation is not visualized. Mild aortic valve stenosis.  7. Aortic dilatation noted. There is borderline dilatation of the ascending aorta, measuring 39 mm.  8. The inferior vena cava is normal in size with greater than 50% respiratory variability, suggesting right atrial pressure of 3 mmHg. FINDINGS  Left Ventricle: Left ventricular ejection fraction, by estimation, is >75%. The left ventricle has hyperdynamic function. The left ventricle has no regional wall motion abnormalities. The left ventricular internal cavity size was normal in size. There is no left ventricular hypertrophy. Left ventricular diastolic parameters are consistent with Grade II diastolic dysfunction (pseudonormalization). Right Ventricle: The right ventricular size is normal. Right ventricular systolic function is normal. There is severely elevated pulmonary artery systolic pressure. The tricuspid regurgitant velocity is 4.36 m/s, and with an assumed right atrial pressure  of 3 mmHg, the estimated right ventricular systolic pressure is 79.0 mmHg. Left Atrium: Left atrial size was  moderately dilated. Right Atrium: Right atrial size was mildly dilated. Pericardium: There is no evidence of pericardial effusion. Mitral Valve: The mitral valve is normal in structure. Mild mitral annular calcification. Trivial mitral valve regurgitation. No evidence of mitral valve stenosis. MV  peak gradient, 4.8 mmHg. The mean mitral valve gradient is 2.0 mmHg. Tricuspid Valve: The tricuspid valve is normal in structure. Tricuspid valve regurgitation is mild . No evidence of tricuspid stenosis. Aortic Valve: The aortic valve is tricuspid. Aortic valve regurgitation is not visualized. Mild aortic stenosis is present. Aortic valve mean gradient measures 9.5 mmHg. Aortic valve peak gradient measures 20.3 mmHg. Aortic valve area, by VTI measures 1.68 cm. Pulmonic Valve: The pulmonic valve was normal in structure. Pulmonic valve regurgitation is not visualized. No evidence of pulmonic stenosis. Aorta: Aortic dilatation noted. There is borderline dilatation of the ascending aorta, measuring 39 mm. Venous: The inferior vena cava is normal in size with greater than 50% respiratory variability, suggesting right atrial pressure of 3 mmHg. IAS/Shunts: No atrial level shunt detected by color flow Doppler.  LEFT VENTRICLE PLAX 2D LVIDd:         4.80 cm      Diastology LVIDs:         3.00 cm      LV e' medial:    11.20 cm/s LV PW:         0.80 cm      LV E/e' medial:  10.5 LV IVS:        0.90 cm      LV e' lateral:   14.90 cm/s LVOT diam:     1.60 cm      LV E/e' lateral: 7.9 LV SV:         73 LV SV Index:   40 LVOT Area:     2.01 cm  LV Volumes (MOD) LV vol d, MOD A2C: 125.0 ml LV vol d, MOD A4C: 121.0 ml LV vol s, MOD A2C: 36.7 ml LV vol s, MOD A4C: 34.2 ml LV SV MOD A2C:     88.3 ml LV SV MOD A4C:     121.0 ml LV SV MOD BP:      88.5 ml RIGHT VENTRICLE             IVC RV Basal diam:  4.40 cm     IVC diam: 1.40 cm RV S prime:     11.50 cm/s TAPSE (M-mode): 1.6 cm RVSP:           79.0 mmHg LEFT ATRIUM             Index         RIGHT ATRIUM           Index LA diam:        4.40 cm 2.38 cm/m   RA Pressure: 3.00 mmHg LA Vol (A2C):   96.3 ml 52.02 ml/m  RA Area:     18.90 cm LA Vol (A4C):   59.8 ml 32.30 ml/m  RA Volume:   60.60 ml  32.74 ml/m LA Biplane Vol: 76.7 ml 41.43 ml/m  AORTIC VALVE                     PULMONIC VALVE AV Area (Vmax):    1.53 cm      PV Vmax:          1.38 m/s AV Area (Vmean):   1.55 cm      PV Peak grad:     7.6 mmHg AV Area (VTI):     1.68 cm      PR End Diast Vel: 3.96 msec AV Vmax:           225.50 cm/s AV Vmean:  141.000 cm/s AV VTI:            0.436 m AV Peak Grad:      20.3 mmHg AV Mean Grad:      9.5 mmHg LVOT Vmax:         172.00 cm/s LVOT Vmean:        109.000 cm/s LVOT VTI:          0.365 m LVOT/AV VTI ratio: 0.84  AORTA Ao Root diam: 3.00 cm Ao Asc diam:  3.90 cm MITRAL VALVE                TRICUSPID VALVE MV Area (PHT): 3.93 cm     TR Peak grad:   76.0 mmHg MV Area VTI:   2.64 cm     TR Mean grad:   41.0 mmHg MV Peak grad:  4.8 mmHg     TR Vmax:        436.00 cm/s MV Mean grad:  2.0 mmHg     TR Vmean:       300.0 cm/s MV Vmax:       1.09 m/s     Estimated RAP:  3.00 mmHg MV Vmean:      68.7 cm/s    RVSP:           79.0 mmHg MV Decel Time: 193 msec MV E velocity: 118.00 cm/s  SHUNTS MV Kellyanne Ellwanger velocity: 83.20 cm/s   Systemic VTI:  0.36 m MV E/Phala Schraeder ratio:  1.42         Systemic Diam: 1.60 cm Redell Shallow MD Electronically signed by Redell Shallow MD Signature Date/Time: 10/22/2023/1:22:44 PM    Final    CT CHEST ABDOMEN PELVIS WO CONTRAST Result Date: 10/21/2023 EXAM: CT CHEST, ABDOMEN AND PELVIS WITHOUT CONTRAST 10/21/2023 08:40:45 PM TECHNIQUE: CT of the chest, abdomen and pelvis was performed without the administration of intravenous contrast. Multiplanar reformatted images are provided for review. Automated exposure control, iterative reconstruction, and/or weight based adjustment of the mA/kV was utilized to reduce the radiation dose to as low as reasonably achievable. COMPARISON: CT  abdomen and pelvis 05/28/2022 and CT angiogram chest 12/21/2017. CLINICAL HISTORY: Polytrauma, blunt. FINDINGS: CHEST: MEDIASTINUM AND LYMPH NODES: Heart is mildly enlarged. There are atherosclerotic calcifications of the aorta and coronary arteries. Patient is status post cardiac surgery. Sternotomy wires are present. The central airways are clear. No mediastinal, hilar or axillary lymphadenopathy. There is Damiana Berrian moderate-sized hiatal hernia and patulous esophagus. LUNGS AND PLEURA: There is Travarius Lange small amount of right lower lobe airspace disease. No pleural effusion or pneumothorax. ABDOMEN AND PELVIS: LIVER: The liver is unremarkable. GALLBLADDER AND BILE DUCTS: Gallbladder is unremarkable. No biliary ductal dilatation. SPLEEN: No acute abnormality. PANCREAS: No acute abnormality. ADRENAL GLANDS: No acute abnormality. KIDNEYS, URETERS AND BLADDER: There is Marga Gramajo 3 cm cyst in the inferior pole of the left kidney, unchanged. Per consensus, no follow-up is needed for simple Bosniak type 1 and 2 renal cysts, unless the patient has Jamarco Zaldivar malignancy history or risk factors. No stones in the kidneys or ureters. No hydronephrosis. No perinephric or periureteral stranding. Urinary bladder is unremarkable. GI AND BOWEL: Stomach demonstrates no acute abnormality. Appendix is not visualized. There is no bowel obstruction. REPRODUCTIVE ORGANS: No acute abnormality. PERITONEUM AND RETROPERITONEUM: No ascites. No free air. There is Nestor Wieneke small fat-containing umbilical hernia. VASCULATURE: Aorta is normal in caliber. There are atherosclerotic calcifications of the aorta and iliac arteries. ABDOMINAL AND PELVIS LYMPH NODES: No lymphadenopathy. BONES AND SOFT TISSUES:  There are degenerative changes throughout the spine. L4-L5 lumbar fusion hardware present. There is Susumu Hackler comminuted right femoral intertrochanteric fracture with superolateral displacement of the distal fracture fragment. No focal soft tissue abnormality. IMPRESSION: 1. Comminuted right  intertrochanteric femur fracture with superolateral displacement of the distal fragment. 2. Small right lower lobe airspace disease. No pleural effusion or pneumothorax. 3. Moderate hiatal hernia. Electronically signed by: Greig Pique MD 10/21/2023 09:00 PM EDT RP Workstation: [id number]   CT HEAD WO CONTRAST Result Date: 10/21/2023 EXAM: CT HEAD AND CERVICAL SPINE 10/21/2023 08:40:45 PM TECHNIQUE: CT of the head and cervical spine was performed without the administration of intravenous contrast. Multiplanar reformatted images are provided for review. Automated exposure control, iterative reconstruction, and/or weight based adjustment of the mA/kV was utilized to reduce the radiation dose to as low as reasonably achievable. COMPARISON: MRI brain dated 08/28/2022. CLINICAL HISTORY: Head trauma, moderate-severe. FINDINGS: CT HEAD BRAIN AND VENTRICLES: No acute intracranial hemorrhage. No mass effect or midline shift. No abnormal extra-axial fluid collection. No evidence of acute infarct. No hydrocephalus. Subcortical and periventricular small vessel ischemic changes. ORBITS: No acute abnormality. SINUSES AND MASTOIDS: No acute abnormality. SOFT TISSUES AND SKULL: No acute skull fracture. No acute soft tissue abnormality. CT CERVICAL SPINE BONES AND ALIGNMENT: No acute fracture or traumatic malalignment. 4 mm anterolisthesis of C7 on T1. DEGENERATIVE CHANGES: Mild degenerative changes of the mid to lower cervical spine. SOFT TISSUES: No prevertebral soft tissue swelling. IMPRESSION: 1. No acute intracranial abnormality. 2. No acute traumatic injury to the cervical spine. Electronically signed by: Pinkie Pebbles MD 10/21/2023 08:58 PM EDT RP Workstation: [id number]   CT CERVICAL SPINE WO CONTRAST Result Date: 10/21/2023 EXAM: CT HEAD AND CERVICAL SPINE 10/21/2023 08:40:45 PM TECHNIQUE: CT of the head and cervical spine was performed without the administration of intravenous contrast. Multiplanar reformatted  images are provided for review. Automated exposure control, iterative reconstruction, and/or weight based adjustment of the mA/kV was utilized to reduce the radiation dose to as low as reasonably achievable. COMPARISON: MRI brain dated 08/28/2022. CLINICAL HISTORY: Head trauma, moderate-severe. FINDINGS: CT HEAD BRAIN AND VENTRICLES: No acute intracranial hemorrhage. No mass effect or midline shift. No abnormal extra-axial fluid collection. No evidence of acute infarct. No hydrocephalus. Subcortical and periventricular small vessel ischemic changes. ORBITS: No acute abnormality. SINUSES AND MASTOIDS: No acute abnormality. SOFT TISSUES AND SKULL: No acute skull fracture. No acute soft tissue abnormality. CT CERVICAL SPINE BONES AND ALIGNMENT: No acute fracture or traumatic malalignment. 4 mm anterolisthesis of C7 on T1. DEGENERATIVE CHANGES: Mild degenerative changes of the mid to lower cervical spine. SOFT TISSUES: No prevertebral soft tissue swelling. IMPRESSION: 1. No acute intracranial abnormality. 2. No acute traumatic injury to the cervical spine. Electronically signed by: Pinkie Pebbles MD 10/21/2023 08:58 PM EDT RP Workstation: [id number]   DG Pelvis Portable Result Date: 10/21/2023 EXAM: 1 or 2 VIEW(S) XRAY OF THE PELVIS 10/21/2023 08:18:00 PM COMPARISON: None available. CLINICAL HISTORY: Fall. FINDINGS: BONES AND JOINTS: Acute comminuted and displaced right intertrochanteric fracture. No right hip dislocation. No acute displaced fracture or dislocation of the left hip. No acute displaced fracture or diastasis of the bones of the pelvis. Degenerative changes of the visualized lower lumbar spine. L5-S1 surgical hardware noted. Sacrum is grossly unremarkable - limited evaluation due to overlapping osseous structures and overlying soft tissues. SOFT TISSUES: The soft tissues are unremarkable. IMPRESSION: 1. Acute comminuted and displaced right intertrochanteric fracture. Electronically signed by: Morgane  Naveau MD 10/21/2023 08:31 PM EDT RP Workstation: [id number]  DG Chest Port 1 View Result Date: 10/21/2023 EXAM: 1 VIEW XRAY OF THE CHEST 10/21/2023 08:18:00 PM COMPARISON: CT chest dated 12/21/2017. CLINICAL HISTORY: Trauma. fall. FINDINGS: LUNGS AND PLEURA: Chronic coarsened interstitial markings with no overt pulmonary edema. No focal consolidation. No pleural effusion. No pneumothorax. HEART AND MEDIASTINUM: Unchanged cardiomediastinal silhouette. Atherosclerotic plaque. Surgical changes overlies the mediastinum. BONES AND SOFT TISSUES: Intact sternotomy wires. No acute osseous abnormality. IMPRESSION: 1. No acute cardiopulmonary process identified. Electronically signed by: Morgane Naveau MD 10/21/2023 08:29 PM EDT RP Workstation: [id number]        Scheduled Meds:  citalopram   20 mg Oral Daily   docusate sodium   100 mg Oral BID   [START ON 10/23/2023] enoxaparin  (LOVENOX ) injection  40 mg Subcutaneous Q24H   insulin  aspart       insulin  aspart  0-9 Units Subcutaneous Q4H   mupirocin  ointment  1 Application Nasal BID   pantoprazole   40 mg Oral Daily   rosuvastatin   10 mg Oral Daily   Continuous Infusions:   ceFAZolin  (ANCEF ) IV       LOS: 1 day    Time spent: over 30 min     Meliton Monte, MD [facility]   To contact the attending provider between 7A-7P or the covering provider during after hours 7P-7A, please log into the web site [email or web address] and access using universal [place] password for that web site. If you do not have the password, please call the hospital operator.  10/22/2023, 7:38 PM

## 2023-10-23 ENCOUNTER — Encounter (HOSPITAL_COMMUNITY): Payer: Self-pay

## 2023-10-23 DIAGNOSIS — I2510 Atherosclerotic heart disease of native coronary artery without angina pectoris: Secondary | ICD-10-CM | POA: Diagnosis not present

## 2023-10-23 DIAGNOSIS — I5032 Chronic diastolic (congestive) heart failure: Secondary | ICD-10-CM | POA: Diagnosis not present

## 2023-10-23 DIAGNOSIS — S72141A Displaced intertrochanteric fracture of right femur, initial encounter for closed fracture: Secondary | ICD-10-CM | POA: Diagnosis not present

## 2023-10-23 DIAGNOSIS — I272 Pulmonary hypertension, unspecified: Secondary | ICD-10-CM | POA: Diagnosis not present

## 2023-10-23 LAB — BASIC METABOLIC PANEL WITH GFR
Anion gap: 10 (ref 5–15)
BUN: 33 mg/dL — ABNORMAL HIGH (ref 8–23)
CO2: 31 mmol/L (ref 22–32)
Calcium: 9.1 mg/dL (ref 8.9–10.3)
Chloride: 97 mmol/L — ABNORMAL LOW (ref 98–111)
Creatinine, Ser: 1.09 mg/dL — ABNORMAL HIGH (ref 0.44–1.00)
GFR, Estimated: 51 mL/min — ABNORMAL LOW (ref >60)
Glucose, Bld: 248 mg/dL — ABNORMAL HIGH (ref 70–99)
Potassium: 4.2 mmol/L (ref 3.5–5.1)
Sodium: 138 mmol/L (ref 135–145)

## 2023-10-23 LAB — CBC
HCT: 21.4 % — ABNORMAL LOW (ref 36.0–46.0)
Hemoglobin: 7 g/dL — ABNORMAL LOW (ref 12.0–15.0)
MCH: 34.8 pg — ABNORMAL HIGH (ref 26.0–34.0)
MCHC: 32.7 g/dL (ref 30.0–36.0)
MCV: 106.5 fL — ABNORMAL HIGH (ref 80.0–100.0)
Platelets: 149 K/uL — ABNORMAL LOW (ref 150–400)
RBC: 2.01 MIL/uL — ABNORMAL LOW (ref 3.87–5.11)
RDW: 13.2 % (ref 11.5–15.5)
WBC: 9.2 K/uL (ref 4.0–10.5)
nRBC: 0 % (ref 0.0–0.2)

## 2023-10-23 LAB — GLUCOSE, CAPILLARY
Glucose-Capillary: 237 mg/dL — ABNORMAL HIGH (ref 70–99)
Glucose-Capillary: 239 mg/dL — ABNORMAL HIGH (ref 70–99)
Glucose-Capillary: 262 mg/dL — ABNORMAL HIGH (ref 70–99)
Glucose-Capillary: 263 mg/dL — ABNORMAL HIGH (ref 70–99)
Glucose-Capillary: 304 mg/dL — ABNORMAL HIGH (ref 70–99)
Glucose-Capillary: 308 mg/dL — ABNORMAL HIGH (ref 70–99)

## 2023-10-23 MED ORDER — SODIUM CHLORIDE 0.9% IV SOLUTION: Freq: Once | INTRAVENOUS | Status: AC

## 2023-10-23 MED ORDER — INSULIN GLARGINE-YFGN 100 UNIT/ML ~~LOC~~ SOLN
10.0000 [IU] | Freq: Every day | SUBCUTANEOUS | Status: DC
  Administered 2023-10-23 – 2023-10-27 (×5): 10 [IU] via SUBCUTANEOUS
  Filled 2023-10-23 (×5): qty 0.1

## 2023-10-23 NOTE — Progress Notes (Signed)
     Subjective:  Patient reports pain as moderate.  Patient is resting in bed.  She reports pain in time she tries to move her leg  Yesterday's total administered Morphine  Milligram Equivalents: 60   Objective:   VITALS:   Vitals:   10/22/23 1613 10/22/23 2006 10/23/23 0009 10/23/23 0409  BP: (!) 122/52 137/67 134/65 100/65  Pulse:  91 78 77  Resp:      Temp:  98.2 F (36.8 C)  98.2 F (36.8 C)  TempSrc:  Oral    SpO2:  100% 100% 100%  Weight:      Height:       Dressings on her right hip.  Thigh is soft.  Calf has no tenderness.  5 of 5 strength EHL FHL gastrocs tibialis anterior, intact sensation in the saphenous, sural, tibial, and peroneal nerve distribution  Lab Results  Component Value Date   WBC 9.2 10/23/2023   HGB 7.0 (L) 10/23/2023   HCT 21.4 (L) 10/23/2023   MCV 106.5 (H) 10/23/2023   PLT 149 (L) 10/23/2023   BMET    Component Value Date/Time   NA 138 10/23/2023 0431   NA 142 05/21/2022 1231   K 4.2 10/23/2023 0431   CL 97 (L) 10/23/2023 0431   CO2 31 10/23/2023 0431   GLUCOSE 248 (H) 10/23/2023 0431   BUN 33 (H) 10/23/2023 0431   BUN 27 05/21/2022 1231   CREATININE 1.09 (H) 10/23/2023 0431   CREATININE 0.80 10/05/2019 1427   CALCIUM  9.1 10/23/2023 0431   EGFR 66 05/21/2022 1231   GFRNONAA 51 (L) 10/23/2023 0431   GFRNONAA >60 10/05/2019 1427      Assessment/Plan: 1 Day Post-Op status post IM rod right hip  The patient is resting comfortably bed.  Her pain is tolerable when she tries to move.  Her H&H is low, and I would defer to medicine the need for possible transfusion.  From an orthopedic standpoint, she can mobilize with physical therapy.  Will start Lovenox  for DVT prophylaxis.  Placed on bowel regimen.  Continue the current pain regimen.  She will likely require discharge back to her rehab facility but determination is going based on how she progresses with therapy as well as work with [facility] management.  She can follow-up with me in 2  weeks time.  Principal Problem:   Closed right hip fracture (HCC) Active Problems:   Coronary artery disease   Carotid artery disease   Chronic diastolic CHF (congestive heart failure) (HCC)   hx of CVA (cerebral vascular accident) (HCC)   Pulmonary hypertension, unspecified (HCC)   Controlled type 2 diabetes mellitus with hyperglycemia (HCC)   Essential hypertension   HLD (hyperlipidemia)   Iron deficiency anemia   Chronic respiratory failure with hypoxia (HCC)   Preop examination   Fall     Sheretha Shadd P Alyne Martinson 10/23/2023, 7:01 AM   Cathlyn Rhein, MD  Contact information:   [id number] 7am-5pm epic message Dr. Rhein, or call office for patient follow up: [phone] After hours and holidays please check [email or web address] for group call information for [facility]

## 2023-10-23 NOTE — Plan of Care (Signed)
  Problem: Education: Goal: Knowledge of General Education information will improve Description: Including pain rating scale, medication(s)/side effects and non-pharmacologic comfort measures Outcome: Progressing   Problem: Clinical Measurements: Goal: Will remain free from infection Outcome: Progressing   Problem: Safety: Goal: Ability to remain free from injury will improve Outcome: Progressing   

## 2023-10-23 NOTE — Inpatient Diabetes Management (Signed)
 Inpatient Diabetes Program Recommendations  AACE/ADA: New Consensus Statement on Inpatient Glycemic Control   Target Ranges:  Prepandial:   less than 140 mg/dL      Peak postprandial:   less than 180 mg/dL (1-2 hours)      Critically ill patients:  140 - 180 mg/dL    Latest Reference Range & Units 10/22/23 08:48 10/22/23 11:52 10/22/23 13:10 10/22/23 14:32 10/22/23 14:59 10/22/23 16:14 10/22/23 20:07 10/23/23 00:15 10/23/23 04:10  Glucose-Capillary 70 - 99 mg/dL 740 (H) 721 (H) 760 (H) 228 (H) 209 (H) 206 (H) 299 (H) 262 (H) 237 (H)   Review of Glycemic Control  Diabetes history: DM2 Outpatient Diabetes medications: Glipizide  XL 5 mg QAM, Jardiance 25 mg daily Current orders for Inpatient glycemic control: Novolog  0-9 units Q4H   Inpatient Diabetes Program Recommendations:     Insulin : Please consider ordering Lantus 10 units Q24H.   Thanks, Earnie Gainer, RN, MSN, CDCES Diabetes Coordinator Inpatient Diabetes Program [phone] (Team Pager from 8am to 5pm)

## 2023-10-23 NOTE — Progress Notes (Signed)
 Rounding Note   Patient Name: Andrea Kirk Date of Encounter: 10/23/2023  [facility] Cardiologist: Lonni CROME Nanas, MD   Subjective Denies any chest pain or dyspnea  Scheduled Meds:  busPIRone   10 mg Oral TID   carvedilol   6.25 mg Oral BID   citalopram   20 mg Oral Daily   docusate sodium   100 mg Oral BID   enoxaparin  (LOVENOX ) injection  40 mg Subcutaneous Q24H   gabapentin   300 mg Oral TID   insulin  aspart  0-9 Units Subcutaneous Q4H   mupirocin  ointment  1 Application Nasal BID   pantoprazole   40 mg Oral Daily   rosuvastatin   10 mg Oral Daily   Continuous Infusions:  PRN Meds: fentaNYL  (SUBLIMAZE ) injection, HYDROcodone -acetaminophen , methocarbamol  **OR** methocarbamol  (ROBAXIN ) injection, ondansetron  **OR** ondansetron  (ZOFRAN ) IV, polyethylene glycol   Vital Signs  Vitals:   10/22/23 2006 10/23/23 0009 10/23/23 0409 10/23/23 0800  BP: 137/67 134/65 100/65 (!) 136/54  Pulse: 91 78 77 70  Resp:      Temp: 98.2 F (36.8 C)  98.2 F (36.8 C) 98.2 F (36.8 C)  TempSrc: Oral     SpO2: 100% 100% 100% 100%  Weight:      Height:        Intake/Output Summary (Last 24 hours) at 10/23/2023 1030 Last data filed at 10/23/2023 0400 Gross per 24 hour  Intake 1450 ml  Output 775 ml  Net 675 ml      10/21/2023    8:39 PM 10/21/2023    8:13 PM 07/14/2023    1:09 PM  Last 3 Weights  Weight (lbs) 185 lb 6.5 oz 178 lb 179 lb 8 oz  Weight (kg) 84.1 kg 80.74 kg 81.421 kg      Telemetry Not on tele, will order - Personally Reviewed  ECG  No new ECG - Personally Reviewed  Physical Exam  GEN: No acute distress.   Neck: No JVD Cardiac: RRR, 2/6 systolic murmur Respiratory: Clear to auscultation bilaterally. GI: Soft, nontender, non-distended  MS: No edema; No deformity. Neuro:  Nonfocal  Psych: Normal affect   Labs High Sensitivity Troponin:   Recent Labs  Lab 10/21/23 2024  TROPONINIHS 7     Chemistry Recent Labs  Lab 10/21/23 2024  10/22/23 0442 10/23/23 0431  NA 134* 134* 138  K 4.4 3.9 4.2  CL 92* 94* 97*  CO2 32 29 31  GLUCOSE 339* 186* 248*  BUN 49* 51* 33*  CREATININE 1.29* 1.23* 1.09*  CALCIUM  8.9 9.1 9.1  MG 2.2  --   --   PROT 7.1 6.8  --   ALBUMIN 3.4* 3.5  --   AST 63* 138*  --   ALT 52* 86*  --   ALKPHOS 115 107  --   BILITOT 0.8 0.8  --   GFRNONAA 41* 44* 51*  ANIONGAP 10 11 10     Lipids No results for input(s): CHOL, TRIG, HDL, LABVLDL, LDLCALC, CHOLHDL in the last 168 hours.  Hematology Recent Labs  Lab 10/21/23 2024 10/22/23 0442 10/23/23 0431  WBC 6.1 8.6 9.2  RBC 2.97*  2.97* 2.87* 2.01*  HGB 10.4* 9.8* 7.0*  HCT 31.9* 30.1* 21.4*  MCV 107.4* 104.9* 106.5*  MCH 35.0* 34.1* 34.8*  MCHC 32.6 32.6 32.7  RDW 13.2 13.1 13.2  PLT 189 166 149*   Thyroid  No results for input(s): TSH, FREET4 in the last 168 hours.  BNPNo results for input(s): BNP, PROBNP in the last 168 hours.  DDimer  No results for input(s): DDIMER in the last 168 hours.   Radiology  DG FEMUR, MIN 2 VIEWS RIGHT Result Date: 10/22/2023 CLINICAL DATA:  Intraoperative fluoroscopic images of proximal right femoral fracture. EXAM: DG FEMUR 2+V*R* COMPARISON:  None Available. FINDINGS: Multiple intraoperative fluoroscopic spot images are provided. Interval open reduction and proximal femoral nail fixation of a comminuted right intertrochanteric femur fracture with improved alignment. Total fluoroscopy time: 1 minute and 23 seconds Total dose: Radiation Exposure Index (as provided by the fluoroscopic device): 29.16 mGy air Kerma Please see intraoperative findings for further detail. IMPRESSION: Intraoperative fluoroscopic spot images of proximal femoral nail fixation of a comminuted right intertrochanteric femur fracture with improved alignment. Electronically Signed   By: Harrietta Sherry M.D.   On: 10/22/2023 18:26   DG C-Arm 1-60 Min-No Report Result Date: 10/22/2023 Fluoroscopy was utilized by the  requesting physician.  No radiographic interpretation.   ECHOCARDIOGRAM COMPLETE Result Date: 10/22/2023    ECHOCARDIOGRAM REPORT   Patient Name:   Andrea Kirk Date of Exam: 10/22/2023 Medical Rec #:  [id number]     Height:       62.0 in Accession #:    [id number]    Weight:       185.4 lb Date of Birth:  [birth year]      BSA:          1.851 m Patient Age:    83 years      BP:           131/51 mmHg Patient Gender: F             HR:           82 bpm. Exam Location:  Inpatient Procedure: 2D Echo, Color Doppler and Cardiac Doppler (Both Spectral and Color            Flow Doppler were utilized during procedure). Indications:    Pre Op Z01.810  History:        Patient has prior history of Echocardiogram examinations, most                 recent 02/15/2021. CAD, Stroke, Signs/Symptoms:Syncope; Risk                 Factors:Diabetes and Hypertension. H/O Hyperlipidemia.  Sonographer:    BERNARDA ROCKS Referring Phys: [email or web address].Diones ANASTASSIA DOUTOVA IMPRESSIONS  1. Left ventricular ejection fraction, by estimation, is >75%. The left ventricle has hyperdynamic function. The left ventricle has no regional wall motion abnormalities. Left ventricular diastolic parameters are consistent with Grade II diastolic dysfunction (pseudonormalization).  2. Right ventricular systolic function is normal. The right ventricular size is normal. There is severely elevated pulmonary artery systolic pressure.  3. Left atrial size was moderately dilated.  4. Right atrial size was mildly dilated.  5. The mitral valve is normal in structure. Trivial mitral valve regurgitation. No evidence of mitral stenosis.  6. The aortic valve is tricuspid. Aortic valve regurgitation is not visualized. Mild aortic valve stenosis.  7. Aortic dilatation noted. There is borderline dilatation of the ascending aorta, measuring 39 mm.  8. The inferior vena cava is normal in size with greater than 50% respiratory variability, suggesting right atrial pressure of 3 mmHg. FINDINGS   Left Ventricle: Left ventricular ejection fraction, by estimation, is >75%. The left ventricle has hyperdynamic function. The left ventricle has no regional wall motion abnormalities. The left ventricular internal cavity size was normal in size. There is no left ventricular hypertrophy. Left ventricular diastolic parameters  are consistent with Grade II diastolic dysfunction (pseudonormalization). Right Ventricle: The right ventricular size is normal. Right ventricular systolic function is normal. There is severely elevated pulmonary artery systolic pressure. The tricuspid regurgitant velocity is 4.36 m/s, and with an assumed right atrial pressure  of 3 mmHg, the estimated right ventricular systolic pressure is 79.0 mmHg. Left Atrium: Left atrial size was moderately dilated. Right Atrium: Right atrial size was mildly dilated. Pericardium: There is no evidence of pericardial effusion. Mitral Valve: The mitral valve is normal in structure. Mild mitral annular calcification. Trivial mitral valve regurgitation. No evidence of mitral valve stenosis. MV peak gradient, 4.8 mmHg. The mean mitral valve gradient is 2.0 mmHg. Tricuspid Valve: The tricuspid valve is normal in structure. Tricuspid valve regurgitation is mild . No evidence of tricuspid stenosis. Aortic Valve: The aortic valve is tricuspid. Aortic valve regurgitation is not visualized. Mild aortic stenosis is present. Aortic valve mean gradient measures 9.5 mmHg. Aortic valve peak gradient measures 20.3 mmHg. Aortic valve area, by VTI measures 1.68 cm. Pulmonic Valve: The pulmonic valve was normal in structure. Pulmonic valve regurgitation is not visualized. No evidence of pulmonic stenosis. Aorta: Aortic dilatation noted. There is borderline dilatation of the ascending aorta, measuring 39 mm. Venous: The inferior vena cava is normal in size with greater than 50% respiratory variability, suggesting right atrial pressure of 3 mmHg. IAS/Shunts: No atrial level  shunt detected by color flow Doppler.  LEFT VENTRICLE PLAX 2D LVIDd:         4.80 cm      Diastology LVIDs:         3.00 cm      LV e' medial:    11.20 cm/s LV PW:         0.80 cm      LV E/e' medial:  10.5 LV IVS:        0.90 cm      LV e' lateral:   14.90 cm/s LVOT diam:     1.60 cm      LV E/e' lateral: 7.9 LV SV:         73 LV SV Index:   40 LVOT Area:     2.01 cm  LV Volumes (MOD) LV vol d, MOD A2C: 125.0 ml LV vol d, MOD A4C: 121.0 ml LV vol s, MOD A2C: 36.7 ml LV vol s, MOD A4C: 34.2 ml LV SV MOD A2C:     88.3 ml LV SV MOD A4C:     121.0 ml LV SV MOD BP:      88.5 ml RIGHT VENTRICLE             IVC RV Basal diam:  4.40 cm     IVC diam: 1.40 cm RV S prime:     11.50 cm/s TAPSE (M-mode): 1.6 cm RVSP:           79.0 mmHg LEFT ATRIUM             Index        RIGHT ATRIUM           Index LA diam:        4.40 cm 2.38 cm/m   RA Pressure: 3.00 mmHg LA Vol (A2C):   96.3 ml 52.02 ml/m  RA Area:     18.90 cm LA Vol (A4C):   59.8 ml 32.30 ml/m  RA Volume:   60.60 ml  32.74 ml/m LA Biplane Vol: 76.7 ml 41.43 ml/m  AORTIC VALVE  PULMONIC VALVE AV Area (Vmax):    1.53 cm      PV Vmax:          1.38 m/s AV Area (Vmean):   1.55 cm      PV Peak grad:     7.6 mmHg AV Area (VTI):     1.68 cm      PR End Diast Vel: 3.96 msec AV Vmax:           225.50 cm/s AV Vmean:          141.000 cm/s AV VTI:            0.436 m AV Peak Grad:      20.3 mmHg AV Mean Grad:      9.5 mmHg LVOT Vmax:         172.00 cm/s LVOT Vmean:        109.000 cm/s LVOT VTI:          0.365 m LVOT/AV VTI ratio: 0.84  AORTA Ao Root diam: 3.00 cm Ao Asc diam:  3.90 cm MITRAL VALVE                TRICUSPID VALVE MV Area (PHT): 3.93 cm     TR Peak grad:   76.0 mmHg MV Area VTI:   2.64 cm     TR Mean grad:   41.0 mmHg MV Peak grad:  4.8 mmHg     TR Vmax:        436.00 cm/s MV Mean grad:  2.0 mmHg     TR Vmean:       300.0 cm/s MV Vmax:       1.09 m/s     Estimated RAP:  3.00 mmHg MV Vmean:      68.7 cm/s    RVSP:           79.0 mmHg MV  Decel Time: 193 msec MV E velocity: 118.00 cm/s  SHUNTS MV A velocity: 83.20 cm/s   Systemic VTI:  0.36 m MV E/A ratio:  1.42         Systemic Diam: 1.60 cm Redell Shallow MD Electronically signed by Redell Shallow MD Signature Date/Time: 10/22/2023/1:22:44 PM    Final    CT CHEST ABDOMEN PELVIS WO CONTRAST Result Date: 10/21/2023 EXAM: CT CHEST, ABDOMEN AND PELVIS WITHOUT CONTRAST 10/21/2023 08:40:45 PM TECHNIQUE: CT of the chest, abdomen and pelvis was performed without the administration of intravenous contrast. Multiplanar reformatted images are provided for review. Automated exposure control, iterative reconstruction, and/or weight based adjustment of the mA/kV was utilized to reduce the radiation dose to as low as reasonably achievable. COMPARISON: CT abdomen and pelvis 05/28/2022 and CT angiogram chest 12/21/2017. CLINICAL HISTORY: Polytrauma, blunt. FINDINGS: CHEST: MEDIASTINUM AND LYMPH NODES: Heart is mildly enlarged. There are atherosclerotic calcifications of the aorta and coronary arteries. Patient is status post cardiac surgery. Sternotomy wires are present. The central airways are clear. No mediastinal, hilar or axillary lymphadenopathy. There is a moderate-sized hiatal hernia and patulous esophagus. LUNGS AND PLEURA: There is a small amount of right lower lobe airspace disease. No pleural effusion or pneumothorax. ABDOMEN AND PELVIS: LIVER: The liver is unremarkable. GALLBLADDER AND BILE DUCTS: Gallbladder is unremarkable. No biliary ductal dilatation. SPLEEN: No acute abnormality. PANCREAS: No acute abnormality. ADRENAL GLANDS: No acute abnormality. KIDNEYS, URETERS AND BLADDER: There is a 3 cm cyst in the inferior pole of the left kidney, unchanged. Per consensus, no follow-up is needed for simple Bosniak type 1 and 2  renal cysts, unless the patient has a malignancy history or risk factors. No stones in the kidneys or ureters. No hydronephrosis. No perinephric or periureteral stranding. Urinary  bladder is unremarkable. GI AND BOWEL: Stomach demonstrates no acute abnormality. Appendix is not visualized. There is no bowel obstruction. REPRODUCTIVE ORGANS: No acute abnormality. PERITONEUM AND RETROPERITONEUM: No ascites. No free air. There is a small fat-containing umbilical hernia. VASCULATURE: Aorta is normal in caliber. There are atherosclerotic calcifications of the aorta and iliac arteries. ABDOMINAL AND PELVIS LYMPH NODES: No lymphadenopathy. BONES AND SOFT TISSUES: There are degenerative changes throughout the spine. L4-L5 lumbar fusion hardware present. There is a comminuted right femoral intertrochanteric fracture with superolateral displacement of the distal fracture fragment. No focal soft tissue abnormality. IMPRESSION: 1. Comminuted right intertrochanteric femur fracture with superolateral displacement of the distal fragment. 2. Small right lower lobe airspace disease. No pleural effusion or pneumothorax. 3. Moderate hiatal hernia. Electronically signed by: Greig Pique MD 10/21/2023 09:00 PM EDT RP Workstation: [id number]   CT HEAD WO CONTRAST Result Date: 10/21/2023 EXAM: CT HEAD AND CERVICAL SPINE 10/21/2023 08:40:45 PM TECHNIQUE: CT of the head and cervical spine was performed without the administration of intravenous contrast. Multiplanar reformatted images are provided for review. Automated exposure control, iterative reconstruction, and/or weight based adjustment of the mA/kV was utilized to reduce the radiation dose to as low as reasonably achievable. COMPARISON: MRI brain dated 08/28/2022. CLINICAL HISTORY: Head trauma, moderate-severe. FINDINGS: CT HEAD BRAIN AND VENTRICLES: No acute intracranial hemorrhage. No mass effect or midline shift. No abnormal extra-axial fluid collection. No evidence of acute infarct. No hydrocephalus. Subcortical and periventricular small vessel ischemic changes. ORBITS: No acute abnormality. SINUSES AND MASTOIDS: No acute abnormality. SOFT TISSUES AND  SKULL: No acute skull fracture. No acute soft tissue abnormality. CT CERVICAL SPINE BONES AND ALIGNMENT: No acute fracture or traumatic malalignment. 4 mm anterolisthesis of C7 on T1. DEGENERATIVE CHANGES: Mild degenerative changes of the mid to lower cervical spine. SOFT TISSUES: No prevertebral soft tissue swelling. IMPRESSION: 1. No acute intracranial abnormality. 2. No acute traumatic injury to the cervical spine. Electronically signed by: Pinkie Pebbles MD 10/21/2023 08:58 PM EDT RP Workstation: [id number]   CT CERVICAL SPINE WO CONTRAST Result Date: 10/21/2023 EXAM: CT HEAD AND CERVICAL SPINE 10/21/2023 08:40:45 PM TECHNIQUE: CT of the head and cervical spine was performed without the administration of intravenous contrast. Multiplanar reformatted images are provided for review. Automated exposure control, iterative reconstruction, and/or weight based adjustment of the mA/kV was utilized to reduce the radiation dose to as low as reasonably achievable. COMPARISON: MRI brain dated 08/28/2022. CLINICAL HISTORY: Head trauma, moderate-severe. FINDINGS: CT HEAD BRAIN AND VENTRICLES: No acute intracranial hemorrhage. No mass effect or midline shift. No abnormal extra-axial fluid collection. No evidence of acute infarct. No hydrocephalus. Subcortical and periventricular small vessel ischemic changes. ORBITS: No acute abnormality. SINUSES AND MASTOIDS: No acute abnormality. SOFT TISSUES AND SKULL: No acute skull fracture. No acute soft tissue abnormality. CT CERVICAL SPINE BONES AND ALIGNMENT: No acute fracture or traumatic malalignment. 4 mm anterolisthesis of C7 on T1. DEGENERATIVE CHANGES: Mild degenerative changes of the mid to lower cervical spine. SOFT TISSUES: No prevertebral soft tissue swelling. IMPRESSION: 1. No acute intracranial abnormality. 2. No acute traumatic injury to the cervical spine. Electronically signed by: Pinkie Pebbles MD 10/21/2023 08:58 PM EDT RP Workstation: [id number]   DG Pelvis  Portable Result Date: 10/21/2023 EXAM: 1 or 2 VIEW(S) XRAY OF THE PELVIS 10/21/2023 08:18:00 PM COMPARISON: None available. CLINICAL HISTORY: Fall.  FINDINGS: BONES AND JOINTS: Acute comminuted and displaced right intertrochanteric fracture. No right hip dislocation. No acute displaced fracture or dislocation of the left hip. No acute displaced fracture or diastasis of the bones of the pelvis. Degenerative changes of the visualized lower lumbar spine. L5-S1 surgical hardware noted. Sacrum is grossly unremarkable - limited evaluation due to overlapping osseous structures and overlying soft tissues. SOFT TISSUES: The soft tissues are unremarkable. IMPRESSION: 1. Acute comminuted and displaced right intertrochanteric fracture. Electronically signed by: Morgane Naveau MD 10/21/2023 08:31 PM EDT RP Workstation: [id number]   DG Chest Port 1 View Result Date: 10/21/2023 EXAM: 1 VIEW XRAY OF THE CHEST 10/21/2023 08:18:00 PM COMPARISON: CT chest dated 12/21/2017. CLINICAL HISTORY: Trauma. fall. FINDINGS: LUNGS AND PLEURA: Chronic coarsened interstitial markings with no overt pulmonary edema. No focal consolidation. No pleural effusion. No pneumothorax. HEART AND MEDIASTINUM: Unchanged cardiomediastinal silhouette. Atherosclerotic plaque. Surgical changes overlies the mediastinum. BONES AND SOFT TISSUES: Intact sternotomy wires. No acute osseous abnormality. IMPRESSION: 1. No acute cardiopulmonary process identified. Electronically signed by: Morgane Naveau MD 10/21/2023 08:29 PM EDT RP Workstation: [id number]    Cardiac Studies   Patient Profile   83 y.o. female with medical history significant of Dm2.  Carotid artery stenosis, CAD status post CABG in 1997, depression hypertension GERD HLD restless leg, chronic diastolic CHF multiple falls, hx of stroke here after a fall with a R intertrochanteric femur fracture.    Assessment & Plan  Pulmonary hypertension/Chronic diastolic heart failure: severe on echo this  admission.  Normal RV size/function.  Suspected group 2 in setting of chronic diastolic heart failure, can consider further work-up as outpatient. -Currently appears euvolemic  CAD: s/p CABG in 1997.  Denies any anginal symptoms.  Was on ASA and brilinta  as outpatient.  Can discontinue brilinta  moving forward.  Would restart ASA 81 mg daily once OK form surgical standpoint.  AS: mild on echo, will monitor as outpatient  Anemia: Hgb 7.0 this morning, would transfuse for goal Hgb>8 given cardiac history  R Intertrochanteric Femur Fracture  s/p surgery on 10/15.  Did well during surgery  For questions or updates, please contact [facility] Please consult [email or web address] for contact info under    Signed, Lonni LITTIE Nanas, MD  10/23/2023, 10:30 AM

## 2023-10-23 NOTE — Progress Notes (Signed)
 PROGRESS NOTE    Andrea Kirk  FMW:[id number] DOB: 1940-02-10 DOA: 10/21/2023 PCP: System, Provider Not In  Chief Complaint  Patient presents with   Fall    Brief Narrative:   Andrea Kirk is Andrea Kirk 83 y.o. female with medical history significant of Dm2.  Carotid artery stenosis, CAD status post CABG in 1997, depression hypertension GERD HLD restless leg, chronic diastolic CHF multiple falls, hx of stroke here after Andrea Kirk fall with Andrea Kirk intertrochanteric femur fracture.      Assessment & Plan:   Principal Problem:   Closed right hip fracture [facility]) Active Problems:   Coronary artery disease   Carotid artery disease   Chronic diastolic CHF (congestive heart failure) (HCC)   hx of CVA (cerebral vascular accident) (HCC)   Pulmonary hypertension, unspecified (HCC)   Controlled type 2 diabetes mellitus with hyperglycemia (HCC)   Essential hypertension   HLD (hyperlipidemia)   Iron deficiency anemia   Chronic respiratory failure with hypoxia (HCC)   Preop examination   Fall  Acute Comminuted and Displaced Kirk Intertrochanteric Femur Fracture 10/15 s/p surgery with orthopedics  WBAT Post op care per ortho   Pending therapy eval  Macrocytic Anemia Post Op Anemia Post op Hb trended down to 7 Transfusing 1 unit pRBC after discussion with her (and her brother - she deferred to her brother) Goal Hb >8 with cardiac issues below B12, folate, iron, ferritin  CAD s/p CABG 1997 Hx CVA Dyslipidemia Crestor  Aspirin /brilinta  currently on hold - looks like she's been on ASA/brilinta  for Laydon Martis few years, appears this was from 04/2020 stroke? Will need to clarify if ongoing indication for DAPT - (per cardiology note, can d/c brilinta  moving forward) Continue coreg  at reduced dose  HFpEF Pulmonary Hypertension Appears euvolemic Will follow, echo with EF >75%, severely elevated PASP Coreg  reduced dose, jardiance on hold Holding lasix  for now  Hypertension Reduced dose coreg   Hold arb    T2DM Holding glipizide  SSI, adding basal today A1c 7.5  GERD PPI  Depression  Anxiety Celexa , buspar     DVT prophylaxis: loveno Code Status: full Family Communication: brother Disposition:   Status is: Inpatient Remains inpatient appropriate because: need for post op care   Consultants:  Cardiology orthopedics  Procedures:  10/15 FIXATION, FRACTURE, INTERTROCHANTERIC, WITH INTRAMEDULLARY ROD   Echo IMPRESSIONS     1. Left ventricular ejection fraction, by estimation, is >75%. The left  ventricle has hyperdynamic function. The left ventricle has no regional  wall motion abnormalities. Left ventricular diastolic parameters are  consistent with Grade II diastolic  dysfunction (pseudonormalization).   2. Right ventricular systolic function is normal. The right ventricular  size is normal. There is severely elevated pulmonary artery systolic  pressure.   3. Left atrial size was moderately dilated.   4. Right atrial size was mildly dilated.   5. The mitral valve is normal in structure. Trivial mitral valve  regurgitation. No evidence of mitral stenosis.   6. The aortic valve is tricuspid. Aortic valve regurgitation is not  visualized. Mild aortic valve stenosis.   7. Aortic dilatation noted. There is borderline dilatation of the  ascending aorta, measuring 39 mm.   8. The inferior vena cava is normal in size with greater than 50%  respiratory variability, suggesting right atrial pressure of 3 mmHg.   Antimicrobials:  Anti-infectives (From admission, onward)    Start     Dose/Rate Route Frequency Ordered Stop   10/23/23 0600  ceFAZolin  (ANCEF ) IVPB 2g/100 mL premix  2 g 200 mL/hr over 30 Minutes Intravenous On call to O.Kirk. 10/22/23 1212 10/22/23 1408   10/22/23 2000  ceFAZolin  (ANCEF ) IVPB 2g/100 mL premix        2 g 200 mL/hr over 30 Minutes Intravenous Every 6 hours 10/22/23 1642 10/23/23 0941       Subjective: She asks me to speak with her  brother about transfusion Discussed with Dr. Branscome, he was agreeable   Objective: Vitals:   10/23/23 1252 10/23/23 1311 10/23/23 1317 10/23/23 1603  BP: 108/63 (!) 140/52 (!) 140/52 126/61  Pulse: 69 77 77 76  Resp: 20 20 20 18   Temp: 99.5 F (37.5 C) 98.1 F (36.7 C) 98 F (36.7 C) 98.5 F (36.9 C)  TempSrc: Oral  Oral Oral  SpO2: 100%  100% 100%  Weight:      Height:        Intake/Output Summary (Last 24 hours) at 10/23/2023 1817 Last data filed at 10/23/2023 1718 Gross per 24 hour  Intake 737.92 ml  Output 1000 ml  Net -262.08 ml   Filed Weights   10/21/23 2013 10/21/23 2039  Weight: 80.7 kg 84.1 kg    Examination:  General: No acute distress. Cardiovascular: RRR Lungs: unlabored Neurological: Alert and oriented 3. Moves all extremities 4 with equal strength. Cranial nerves II through XII grossly intact. Extremities: RLE with dressing intact    Data Reviewed: I have personally reviewed following labs and imaging studies  CBC: Recent Labs  Lab 10/21/23 2020 10/21/23 2024 10/22/23 0442 10/23/23 0431  WBC  --  6.1 8.6 9.2  HGB 11.6* 10.4* 9.8* 7.0*  HCT 34.0* 31.9* 30.1* 21.4*  MCV  --  107.4* 104.9* 106.5*  PLT  --  189 166 149*    Basic Metabolic Panel: Recent Labs  Lab 10/21/23 2020 10/21/23 2024 10/22/23 0442 10/23/23 0431  NA 135 134* 134* 138  K 4.5 4.4 3.9 4.2  CL 92* 92* 94* 97*  CO2  --  32 29 31  GLUCOSE 334* 339* 186* 248*  BUN 52* 49* 51* 33*  CREATININE 1.30* 1.29* 1.23* 1.09*  CALCIUM   --  8.9 9.1 9.1  MG  --  2.2  --   --   PHOS  --  4.1  --   --     GFR: Estimated Creatinine Clearance: 40 mL/min (Holdan Stucke) (by C-G formula based on SCr of 1.09 mg/dL (H)).  Liver Function Tests: Recent Labs  Lab 10/21/23 2024 10/22/23 0442  AST 63* 138*  ALT 52* 86*  ALKPHOS 115 107  BILITOT 0.8 0.8  PROT 7.1 6.8  ALBUMIN 3.4* 3.5    CBG: Recent Labs  Lab 10/22/23 2007 10/23/23 0015 10/23/23 0410 10/23/23 1141 10/23/23 1609   GLUCAP 299* 262* 237* 304* 263*     Recent Results (from the past 240 hours)  Surgical PCR screen     Status: Abnormal   Collection Time: 10/22/23  3:54 AM   Specimen: Nasal Mucosa; Nasal Swab  Result Value Ref Range Status   MRSA, PCR NEGATIVE NEGATIVE Final   Staphylococcus aureus POSITIVE (Mckenzy Salazar) NEGATIVE Final    Comment: (NOTE) The Xpert SA Assay (FDA approved for NASAL specimens in patients 69 years of age and older), is one component of Anushree Dorsi comprehensive surveillance program. It is not intended to diagnose infection nor to guide or monitor treatment. Performed at [facility], [address] [place], [place] [address]          Radiology Studies:  DG FEMUR, MIN 2 VIEWS RIGHT Result Date: 10/22/2023 CLINICAL DATA:  Intraoperative fluoroscopic images of proximal right femoral fracture. EXAM: DG FEMUR 2+V*Kirk* COMPARISON:  None Available. FINDINGS: Multiple intraoperative fluoroscopic spot images are provided. Interval open reduction and proximal femoral nail fixation of Imaan Padgett comminuted right intertrochanteric femur fracture with improved alignment. Total fluoroscopy time: 1 minute and 23 seconds Total dose: Radiation Exposure Index (as provided by the fluoroscopic device): 29.16 mGy air Kerma Please see intraoperative findings for further detail. IMPRESSION: Intraoperative fluoroscopic spot images of proximal femoral nail fixation of Kashlynn Kundert comminuted right intertrochanteric femur fracture with improved alignment. Electronically Signed   By: Harrietta Sherry M.D.   On: 10/22/2023 18:26   DG C-Arm 1-60 Min-No Report Result Date: 10/22/2023 Fluoroscopy was utilized by the requesting physician.  No radiographic interpretation.   ECHOCARDIOGRAM COMPLETE Result Date: 10/22/2023    ECHOCARDIOGRAM REPORT   Patient Name:   Baneza L Berte Date of Exam: 10/22/2023 Medical Rec #:  [id number]     Height:       62.0 in Accession #:    [id number]    Weight:       185.4 lb Date of Birth:  04/21/1940       BSA:          1.851 m Patient Age:    83 years      BP:           131/51 mmHg Patient Gender: F             HR:           82 bpm. Exam Location:  Inpatient Procedure: 2D Echo, Color Doppler and Cardiac Doppler (Both Spectral and Color            Flow Doppler were utilized during procedure). Indications:    Pre Op Z01.810  History:        Patient has prior history of Echocardiogram examinations, most                 recent 02/15/2021. CAD, Stroke, Signs/Symptoms:Syncope; Risk                 Factors:Diabetes and Hypertension. H/O Hyperlipidemia.  Sonographer:    BERNARDA ROCKS Referring Phys: Domingo.Diones ANASTASSIA DOUTOVA IMPRESSIONS  1. Left ventricular ejection fraction, by estimation, is >75%. The left ventricle has hyperdynamic function. The left ventricle has no regional wall motion abnormalities. Left ventricular diastolic parameters are consistent with Grade II diastolic dysfunction (pseudonormalization).  2. Right ventricular systolic function is normal. The right ventricular size is normal. There is severely elevated pulmonary artery systolic pressure.  3. Left atrial size was moderately dilated.  4. Right atrial size was mildly dilated.  5. The mitral valve is normal in structure. Trivial mitral valve regurgitation. No evidence of mitral stenosis.  6. The aortic valve is tricuspid. Aortic valve regurgitation is not visualized. Mild aortic valve stenosis.  7. Aortic dilatation noted. There is borderline dilatation of the ascending aorta, measuring 39 mm.  8. The inferior vena cava is normal in size with greater than 50% respiratory variability, suggesting right atrial pressure of 3 mmHg. FINDINGS  Left Ventricle: Left ventricular ejection fraction, by estimation, is >75%. The left ventricle has hyperdynamic function. The left ventricle has no regional wall motion abnormalities. The left ventricular internal cavity size was normal in size. There is no left ventricular hypertrophy. Left ventricular diastolic  parameters are consistent with Grade II diastolic dysfunction (pseudonormalization). Right Ventricle: The right ventricular size  is normal. Right ventricular systolic function is normal. There is severely elevated pulmonary artery systolic pressure. The tricuspid regurgitant velocity is 4.36 m/s, and with an assumed right atrial pressure  of 3 mmHg, the estimated right ventricular systolic pressure is 79.0 mmHg. Left Atrium: Left atrial size was moderately dilated. Right Atrium: Right atrial size was mildly dilated. Pericardium: There is no evidence of pericardial effusion. Mitral Valve: The mitral valve is normal in structure. Mild mitral annular calcification. Trivial mitral valve regurgitation. No evidence of mitral valve stenosis. MV peak gradient, 4.8 mmHg. The mean mitral valve gradient is 2.0 mmHg. Tricuspid Valve: The tricuspid valve is normal in structure. Tricuspid valve regurgitation is mild . No evidence of tricuspid stenosis. Aortic Valve: The aortic valve is tricuspid. Aortic valve regurgitation is not visualized. Mild aortic stenosis is present. Aortic valve mean gradient measures 9.5 mmHg. Aortic valve peak gradient measures 20.3 mmHg. Aortic valve area, by VTI measures 1.68 cm. Pulmonic Valve: The pulmonic valve was normal in structure. Pulmonic valve regurgitation is not visualized. No evidence of pulmonic stenosis. Aorta: Aortic dilatation noted. There is borderline dilatation of the ascending aorta, measuring 39 mm. Venous: The inferior vena cava is normal in size with greater than 50% respiratory variability, suggesting right atrial pressure of 3 mmHg. IAS/Shunts: No atrial level shunt detected by color flow Doppler.  LEFT VENTRICLE PLAX 2D LVIDd:         4.80 cm      Diastology LVIDs:         3.00 cm      LV e' medial:    11.20 cm/s LV PW:         0.80 cm      LV E/e' medial:  10.5 LV IVS:        0.90 cm      LV e' lateral:   14.90 cm/s LVOT diam:     1.60 cm      LV E/e' lateral: 7.9 LV SV:          73 LV SV Index:   40 LVOT Area:     2.01 cm  LV Volumes (MOD) LV vol d, MOD A2C: 125.0 ml LV vol d, MOD A4C: 121.0 ml LV vol s, MOD A2C: 36.7 ml LV vol s, MOD A4C: 34.2 ml LV SV MOD A2C:     88.3 ml LV SV MOD A4C:     121.0 ml LV SV MOD BP:      88.5 ml RIGHT VENTRICLE             IVC RV Basal diam:  4.40 cm     IVC diam: 1.40 cm RV S prime:     11.50 cm/s TAPSE (M-mode): 1.6 cm RVSP:           79.0 mmHg LEFT ATRIUM             Index        RIGHT ATRIUM           Index LA diam:        4.40 cm 2.38 cm/m   RA Pressure: 3.00 mmHg LA Vol (A2C):   96.3 ml 52.02 ml/m  RA Area:     18.90 cm LA Vol (A4C):   59.8 ml 32.30 ml/m  RA Volume:   60.60 ml  32.74 ml/m LA Biplane Vol: 76.7 ml 41.43 ml/m  AORTIC VALVE  PULMONIC VALVE AV Area (Vmax):    1.53 cm      PV Vmax:          1.38 m/s AV Area (Vmean):   1.55 cm      PV Peak grad:     7.6 mmHg AV Area (VTI):     1.68 cm      PR End Diast Vel: 3.96 msec AV Vmax:           225.50 cm/s AV Vmean:          141.000 cm/s AV VTI:            0.436 m AV Peak Grad:      20.3 mmHg AV Mean Grad:      9.5 mmHg LVOT Vmax:         172.00 cm/s LVOT Vmean:        109.000 cm/s LVOT VTI:          0.365 m LVOT/AV VTI ratio: 0.84  AORTA Ao Root diam: 3.00 cm Ao Asc diam:  3.90 cm MITRAL VALVE                TRICUSPID VALVE MV Area (PHT): 3.93 cm     TR Peak grad:   76.0 mmHg MV Area VTI:   2.64 cm     TR Mean grad:   41.0 mmHg MV Peak grad:  4.8 mmHg     TR Vmax:        436.00 cm/s MV Mean grad:  2.0 mmHg     TR Vmean:       300.0 cm/s MV Vmax:       1.09 m/s     Estimated RAP:  3.00 mmHg MV Vmean:      68.7 cm/s    RVSP:           79.0 mmHg MV Decel Time: 193 msec MV E velocity: 118.00 cm/s  SHUNTS MV Nailani Full velocity: 83.20 cm/s   Systemic VTI:  0.36 m MV E/Ammi Hutt ratio:  1.42         Systemic Diam: 1.60 cm Redell Shallow MD Electronically signed by Redell Shallow MD Signature Date/Time: 10/22/2023/1:22:44 PM    Final    CT CHEST ABDOMEN PELVIS WO CONTRAST Result Date:  10/21/2023 EXAM: CT CHEST, ABDOMEN AND PELVIS WITHOUT CONTRAST 10/21/2023 08:40:45 PM TECHNIQUE: CT of the chest, abdomen and pelvis was performed without the administration of intravenous contrast. Multiplanar reformatted images are provided for review. Automated exposure control, iterative reconstruction, and/or weight based adjustment of the mA/kV was utilized to reduce the radiation dose to as low as reasonably achievable. COMPARISON: CT abdomen and pelvis 05/28/2022 and CT angiogram chest 12/21/2017. CLINICAL HISTORY: Polytrauma, blunt. FINDINGS: CHEST: MEDIASTINUM AND LYMPH NODES: Heart is mildly enlarged. There are atherosclerotic calcifications of the aorta and coronary arteries. Patient is status post cardiac surgery. Sternotomy wires are present. The central airways are clear. No mediastinal, hilar or axillary lymphadenopathy. There is Rochel Privett moderate-sized hiatal hernia and patulous esophagus. LUNGS AND PLEURA: There is Aseel Uhde small amount of right lower lobe airspace disease. No pleural effusion or pneumothorax. ABDOMEN AND PELVIS: LIVER: The liver is unremarkable. GALLBLADDER AND BILE DUCTS: Gallbladder is unremarkable. No biliary ductal dilatation. SPLEEN: No acute abnormality. PANCREAS: No acute abnormality. ADRENAL GLANDS: No acute abnormality. KIDNEYS, URETERS AND BLADDER: There is Mikayla Chiusano 3 cm cyst in the inferior pole of the left kidney, unchanged. Per consensus, no follow-up is needed for simple Bosniak type 1 and 2  renal cysts, unless the patient has Kristien Salatino malignancy history or risk factors. No stones in the kidneys or ureters. No hydronephrosis. No perinephric or periureteral stranding. Urinary bladder is unremarkable. GI AND BOWEL: Stomach demonstrates no acute abnormality. Appendix is not visualized. There is no bowel obstruction. REPRODUCTIVE ORGANS: No acute abnormality. PERITONEUM AND RETROPERITONEUM: No ascites. No free air. There is Dema Timmons small fat-containing umbilical hernia. VASCULATURE: Aorta is normal in  caliber. There are atherosclerotic calcifications of the aorta and iliac arteries. ABDOMINAL AND PELVIS LYMPH NODES: No lymphadenopathy. BONES AND SOFT TISSUES: There are degenerative changes throughout the spine. L4-L5 lumbar fusion hardware present. There is Eugene Isadore comminuted right femoral intertrochanteric fracture with superolateral displacement of the distal fracture fragment. No focal soft tissue abnormality. IMPRESSION: 1. Comminuted right intertrochanteric femur fracture with superolateral displacement of the distal fragment. 2. Small right lower lobe airspace disease. No pleural effusion or pneumothorax. 3. Moderate hiatal hernia. Electronically signed by: Greig Pique MD 10/21/2023 09:00 PM EDT RP Workstation: [id number]   CT HEAD WO CONTRAST Result Date: 10/21/2023 EXAM: CT HEAD AND CERVICAL SPINE 10/21/2023 08:40:45 PM TECHNIQUE: CT of the head and cervical spine was performed without the administration of intravenous contrast. Multiplanar reformatted images are provided for review. Automated exposure control, iterative reconstruction, and/or weight based adjustment of the mA/kV was utilized to reduce the radiation dose to as low as reasonably achievable. COMPARISON: MRI brain dated 08/28/2022. CLINICAL HISTORY: Head trauma, moderate-severe. FINDINGS: CT HEAD BRAIN AND VENTRICLES: No acute intracranial hemorrhage. No mass effect or midline shift. No abnormal extra-axial fluid collection. No evidence of acute infarct. No hydrocephalus. Subcortical and periventricular small vessel ischemic changes. ORBITS: No acute abnormality. SINUSES AND MASTOIDS: No acute abnormality. SOFT TISSUES AND SKULL: No acute skull fracture. No acute soft tissue abnormality. CT CERVICAL SPINE BONES AND ALIGNMENT: No acute fracture or traumatic malalignment. 4 mm anterolisthesis of C7 on T1. DEGENERATIVE CHANGES: Mild degenerative changes of the mid to lower cervical spine. SOFT TISSUES: No prevertebral soft tissue swelling.  IMPRESSION: 1. No acute intracranial abnormality. 2. No acute traumatic injury to the cervical spine. Electronically signed by: Pinkie Pebbles MD 10/21/2023 08:58 PM EDT RP Workstation: [id number]   CT CERVICAL SPINE WO CONTRAST Result Date: 10/21/2023 EXAM: CT HEAD AND CERVICAL SPINE 10/21/2023 08:40:45 PM TECHNIQUE: CT of the head and cervical spine was performed without the administration of intravenous contrast. Multiplanar reformatted images are provided for review. Automated exposure control, iterative reconstruction, and/or weight based adjustment of the mA/kV was utilized to reduce the radiation dose to as low as reasonably achievable. COMPARISON: MRI brain dated 08/28/2022. CLINICAL HISTORY: Head trauma, moderate-severe. FINDINGS: CT HEAD BRAIN AND VENTRICLES: No acute intracranial hemorrhage. No mass effect or midline shift. No abnormal extra-axial fluid collection. No evidence of acute infarct. No hydrocephalus. Subcortical and periventricular small vessel ischemic changes. ORBITS: No acute abnormality. SINUSES AND MASTOIDS: No acute abnormality. SOFT TISSUES AND SKULL: No acute skull fracture. No acute soft tissue abnormality. CT CERVICAL SPINE BONES AND ALIGNMENT: No acute fracture or traumatic malalignment. 4 mm anterolisthesis of C7 on T1. DEGENERATIVE CHANGES: Mild degenerative changes of the mid to lower cervical spine. SOFT TISSUES: No prevertebral soft tissue swelling. IMPRESSION: 1. No acute intracranial abnormality. 2. No acute traumatic injury to the cervical spine. Electronically signed by: Pinkie Pebbles MD 10/21/2023 08:58 PM EDT RP Workstation: [id number]   DG Pelvis Portable Result Date: 10/21/2023 EXAM: 1 or 2 VIEW(S) XRAY OF THE PELVIS 10/21/2023 08:18:00 PM COMPARISON: None available. CLINICAL HISTORY: Fall. FINDINGS:  BONES AND JOINTS: Acute comminuted and displaced right intertrochanteric fracture. No right hip dislocation. No acute displaced fracture or dislocation of the  left hip. No acute displaced fracture or diastasis of the bones of the pelvis. Degenerative changes of the visualized lower lumbar spine. L5-S1 surgical hardware noted. Sacrum is grossly unremarkable - limited evaluation due to overlapping osseous structures and overlying soft tissues. SOFT TISSUES: The soft tissues are unremarkable. IMPRESSION: 1. Acute comminuted and displaced right intertrochanteric fracture. Electronically signed by: Morgane Naveau MD 10/21/2023 08:31 PM EDT RP Workstation: [id number]   DG Chest Port 1 View Result Date: 10/21/2023 EXAM: 1 VIEW XRAY OF THE CHEST 10/21/2023 08:18:00 PM COMPARISON: CT chest dated 12/21/2017. CLINICAL HISTORY: Trauma. fall. FINDINGS: LUNGS AND PLEURA: Chronic coarsened interstitial markings with no overt pulmonary edema. No focal consolidation. No pleural effusion. No pneumothorax. HEART AND MEDIASTINUM: Unchanged cardiomediastinal silhouette. Atherosclerotic plaque. Surgical changes overlies the mediastinum. BONES AND SOFT TISSUES: Intact sternotomy wires. No acute osseous abnormality. IMPRESSION: 1. No acute cardiopulmonary process identified. Electronically signed by: Morgane Naveau MD 10/21/2023 08:29 PM EDT RP Workstation: [id number]        Scheduled Meds:  busPIRone   10 mg Oral TID   carvedilol   6.25 mg Oral BID   citalopram   20 mg Oral Daily   docusate sodium   100 mg Oral BID   enoxaparin  (LOVENOX ) injection  40 mg Subcutaneous Q24H   gabapentin   300 mg Oral TID   insulin  aspart  0-9 Units Subcutaneous Q4H   insulin  glargine-yfgn  10 Units Subcutaneous Daily   mupirocin  ointment  1 Application Nasal BID   pantoprazole   40 mg Oral Daily   rosuvastatin   10 mg Oral Daily   Continuous Infusions:     LOS: 2 days    Time spent: over 30 min     Meliton Monte, MD [facility]   To contact the attending provider between 7A-7P or the covering provider during after hours 7P-7A, please log into the web site [email or web address] and  access using universal [place] password for that web site. If you do not have the password, please call the hospital operator.  10/23/2023, 6:17 PM

## 2023-10-23 NOTE — Evaluation (Signed)
 Physical Therapy Evaluation Patient Details Name: Andrea Kirk MRN: [id number] DOB: 1940/11/10 Today's Date: 10/23/2023  History of Present Illness  Pt is a 83 y.o. female admitted 10/21/23 after mechanical fall at her nursing facility landing on her right side and hitting her head. X-ray of right hip show a displaced intertrochanteric hip fracture. CT head with no acute intracranial abnormality or acute traumatic injury to the cervical spine. Pt s/p IM rod 10/15. PMHx: HTN, HLD, anxiety, CAD, s/p left CAE in 2015, right CAE in 2018, s/p CABG in 1997, anemia, T2DM, GERD, RLS, and CVA.   Clinical Impression  Pt admitted with above diagnosis. PTA, pt reports she was modI for functional mobility ambulating short-distances using rollator and long-distances with manual w/c. She is a long-term resident at [facility]. Pt currently with functional limitations due to the deficits listed below (see PT Problem List). She required max-totalA x2 for bed mobility and modA x2 for sit<>stand. Pt is currently limited by impaired cognition, emotional lability, anxiety, pain, impaired balance, generalized weakness, and decreased activity tolerance. Pt will benefit from acute skilled PT to increase her independence and safety with mobility to allow discharge. Recommend continued inpatient follow up therapy, <3 hours/day.    If plan is discharge home, recommend the following: Two people to help with walking and/or transfers;Two people to help with bathing/dressing/bathroom;Assistance with cooking/housework;Assist for transportation;Help with stairs or ramp for entrance   Can travel by private vehicle   No    Equipment Recommendations Hospital bed;Hoyer lift  Recommendations for Other Services       Functional Status Assessment Patient has had a recent decline in their functional status and demonstrates the ability to make significant improvements in function in a reasonable and predictable amount of time.      Precautions / Restrictions Precautions Precautions: Fall Recall of Precautions/Restrictions: Intact Restrictions Weight Bearing Restrictions Per Provider Order: Yes RLE Weight Bearing Per Provider Order: Weight bearing as tolerated      Mobility  Bed Mobility Overal bed mobility: Needs Assistance Bed Mobility: Rolling, Sidelying to Sit, Sit to Supine Rolling: Max assist, +2 for physical assistance, +2 for safety/equipment Sidelying to sit: Max assist, +2 for safety/equipment, +2 for physical assistance   Sit to supine: Total assist, +2 for safety/equipment, +2 for physical assistance   General bed mobility comments: Pt sat up on L side of bed wtih increased time. Step by step cues for sequencing. Assist to manage BLE and trunk. Repositioned pt using bed features and bed pad.    Transfers Overall transfer level: Needs assistance Equipment used: 2 person hand held assist Transfers: Sit to/from Stand Sit to Stand: Mod assist, +2 physical assistance, +2 safety/equipment           General transfer comment: Pt stood from lowest bed height. Powered up with modA x2. Pt maintained static stance briefly, unable to progress to weight shifting or stepping. She quickly fatigued and trunk began to flex. Fair eccentric control with sitting.    Ambulation/Gait                  Stairs            Wheelchair Mobility     Tilt Bed    Modified Rankin (Stroke Patients Only)       Balance Overall balance assessment: Needs assistance Sitting-balance support: Feet supported Sitting balance-Leahy Scale: Poor Sitting balance - Comments: max A initally, progressed to close CGA   Standing balance support: Bilateral upper extremity supported, During  functional activity Standing balance-Leahy Scale: Poor Standing balance comment: Pt dependent on external support of therapist (modA x2) maintained static stance for <30 sec.                             Pertinent  Vitals/Pain Pain Assessment Pain Assessment: Faces Faces Pain Scale: Hurts even more Pain Location: RLE Pain Descriptors / Indicators: Discomfort, Grimacing, Guarding, Moaning Pain Intervention(s): Monitored during session, Limited activity within patient's tolerance, Repositioned, Patient requesting pain meds-RN notified    Home Living Family/patient expects to be discharged to:: Skilled nursing facility                   Additional Comments: LTC at [facility], 3 years    Prior Function Prior Level of Function : Independent/Modified Independent             Mobility Comments: Pt reports she can get out of bed on her own, transfers using AD, ambulates to the bathroom using rollator, otherwise in a manual w/c. She states staff sometimes helps her with mobility. 1 fall leading to current admit. ADLs Comments: Pt reports she sponge bathes at baseline. She can get herself dressed, toileted, and groomed. Staff manage meds and meals.     Extremity/Trunk Assessment   Upper Extremity Assessment Upper Extremity Assessment: Defer to OT evaluation    Lower Extremity Assessment Lower Extremity Assessment: Generalized weakness;RLE deficits/detail RLE Deficits / Details: Pt POD 1 s/p femur IM rod. Decreased hip and knee AROM. Pt resistant to PROM attempts. Grossly 3-/5 strength. RLE: Unable to fully assess due to pain RLE Sensation: WNL RLE Coordination: decreased gross motor    Cervical / Trunk Assessment Cervical / Trunk Assessment: Normal  Communication   Communication Communication: No apparent difficulties;Other (comment) (Pt with intelligable speech/sounds at times when pain became overwhelming.) Factors Affecting Communication: Other (comment) (pt with intelligable speech/sounds at times)    Cognition Arousal: Alert Behavior During Therapy: Lability, Impulsive, Anxious   PT - Cognitive impairments: No family/caregiver present to determine baseline, Attention,  Initiation, Sequencing, Safety/Judgement                       PT - Cognition Comments: Pt nervous to mobilize. She would scream throughout session. Provided reassurance and support. She perserverated on the need for nasal spray in order to bre breath better. Pt developed increased RR and demonstrated mouth breathing. Cues for PLB throughout. Redirection to focus on task and to participate in session. Following commands: Impaired Following commands impaired: Follows one step commands inconsistently     Cueing Cueing Techniques: Verbal cues     General Comments General comments (skin integrity, edema, etc.): VSS on 3L O2.    Exercises     Assessment/Plan    PT Assessment Patient needs continued PT services  PT Problem List Decreased strength;Decreased range of motion;Decreased activity tolerance;Decreased balance;Decreased mobility;Decreased coordination;Decreased cognition;Decreased knowledge of use of DME;Decreased safety awareness;Pain       PT Treatment Interventions DME instruction;Gait training;Functional mobility training;Therapeutic activities;Therapeutic exercise;Balance training;Patient/family education;Wheelchair mobility training    PT Goals (Current goals can be found in the Care Plan section)  Acute Rehab PT Goals Patient Stated Goal: Have less pain PT Goal Formulation: With patient Time For Goal Achievement: 11/06/23 Potential to Achieve Goals: Fair    Frequency Min 1X/week     Co-evaluation PT/OT/SLP Co-Evaluation/Treatment: Yes Reason for Co-Treatment: For patient/therapist safety;To address functional/ADL transfers PT goals addressed  during session: Mobility/safety with mobility;Balance;Proper use of DME         AM-PAC PT 6 Clicks Mobility  Outcome Measure Help needed turning from your back to your side while in a flat bed without using bedrails?: Total Help needed moving from lying on your back to sitting on the side of a flat bed without  using bedrails?: Total Help needed moving to and from a bed to a chair (including a wheelchair)?: Total Help needed standing up from a chair using your arms (e.g., wheelchair or bedside chair)?: Total Help needed to walk in hospital room?: Total Help needed climbing 3-5 steps with a railing? : Total 6 Click Score: 6    End of Session Equipment Utilized During Treatment: Gait belt;Oxygen  Activity Tolerance: Patient tolerated treatment well;Patient limited by pain Patient left: in bed;with call bell/phone within reach;with bed alarm set Nurse Communication: Mobility status;Need for lift equipment (maximove to transfer) PT Visit Diagnosis: Muscle weakness (generalized) (M62.81);Pain;Other abnormalities of gait and mobility (R26.89);Unsteadiness on feet (R26.81);Difficulty in walking, not elsewhere classified (R26.2) Pain - Right/Left: Right Pain - part of body: Hip    Time: 1002-1032 PT Time Calculation (min) (ACUTE ONLY): 30 min   Charges:   PT Evaluation $PT Eval Moderate Complexity: 1 Mod   PT General Charges $$ ACUTE PT VISIT: 1 Visit         Randall SAUNDERS, PT, DPT Acute Rehabilitation Services Office: [phone] Secure Chat Preferred  Andrea Kirk 10/23/2023, 1:20 PM

## 2023-10-23 NOTE — Evaluation (Signed)
 Occupational Therapy Evaluation Patient Details Name: Andrea Kirk MRN: [id number] DOB: 1940-11-10 Today's Date: 10/23/2023   History of Present Illness   Pt is a 82 y.o. female admitted 10/21/23 after mechanical fall at her nursing facility landing on her right side and hitting her head. X-ray of right hip show a displaced intertrochanteric hip fracture. CT head with no acute intracranial abnormality or acute traumatic injury to the cervical spine. Pt s/p IM rod 10/15. PMHx: HTN, HLD, anxiety, CAD, s/p left CAE in 2015, right CAE in 2018, s/p CABG in 1997, anemia, T2DM, GERD, RLS, and CVA.     Clinical Impressions Andrea Kirk was evaluated s/p the above admission list. She is from LTC at [facility] and states she ambulates with RW, rollator or WC and is mod I for ADLs at baseline. Upon evaluation the pt was limited by RLE pain, impaired cognition, perseveration on needing nose spray, lability, anxiety, impaired command following and poor activity tolerance. Overall she needed max A +2 to get to the EOB, mod A +2 to stand and was unable to progress to weight shifting or stepping. Due to the deficits listed below the pt also needs up to total A for LB ADLs and set up A for UB ADLs. Pt will benefit from continued acute OT services and skilled inpatient follow up therapy, <3 hours/day.       If plan is discharge home, recommend the following:   A lot of help with walking and/or transfers;Two people to help with walking and/or transfers;A lot of help with bathing/dressing/bathroom;Two people to help with bathing/dressing/bathroom;Assist for transportation;Help with stairs or ramp for entrance;Assistance with feeding;Supervision due to cognitive status      Equipment Recommendations   None recommended by OT      Precautions/Restrictions   Precautions Precautions: Fall Recall of Precautions/Restrictions: Intact Restrictions Weight Bearing Restrictions Per Provider Order: Yes RLE Weight  Bearing Per Provider Order: Weight bearing as tolerated     Mobility Bed Mobility Overal bed mobility: Needs Assistance Bed Mobility: Rolling, Sidelying to Sit, Sit to Supine Rolling: Max assist, +2 for physical assistance, +2 for safety/equipment Sidelying to sit: Max assist, +2 for safety/equipment, +2 for physical assistance   Sit to supine: Total assist, +2 for safety/equipment, +2 for physical assistance   General bed mobility comments: required step by step cues, pt yelling throughout    Transfers Overall transfer level: Needs assistance Equipment used: 2 person hand held assist Transfers: Sit to/from Stand Sit to Stand: Mod assist, +2 physical assistance, +2 safety/equipment           General transfer comment: stood at the EOB with mod A +2, unable to progress to weight shifting or stepping      Balance Overall balance assessment: Needs assistance Sitting-balance support: Feet supported Sitting balance-Leahy Scale: Poor Sitting balance - Comments: max A initally, progressed to close CGA     Standing balance-Leahy Scale: Poor                             ADL either performed or assessed with clinical judgement   ADL Overall ADL's : Needs assistance/impaired Eating/Feeding: Independent   Grooming: Set up;Sitting   Upper Body Bathing: Set up;Sitting   Lower Body Bathing: Total assistance;Sit to/from stand   Upper Body Dressing : Set up;Sitting   Lower Body Dressing: Maximal assistance;Sit to/from stand   Toilet Transfer: Total assistance Toilet Transfer Details (indicate cue type and reason): pt unable  to transfer on evaluation         Functional mobility during ADLs: Maximal assistance;+2 for safety/equipment;+2 for physical assistance General ADL Comments: pt limited by pain, cognition, activity tolerance. she was able to stand at the EOB with mod A +2 but unable to progress to weight shifting or stepping     Vision Baseline  Vision/History: 1 Wears glasses Vision Assessment?: No apparent visual deficits     Perception Perception: Not tested       Praxis Praxis: Not tested       Pertinent Vitals/Pain Pain Assessment Pain Assessment: Faces Faces Pain Scale: Hurts even more Pain Location: RLE Pain Descriptors / Indicators: Discomfort, Grimacing, Guarding, Moaning Pain Intervention(s): Limited activity within patient's tolerance, Monitored during session, Patient requesting pain meds-RN notified     Extremity/Trunk Assessment Upper Extremity Assessment Upper Extremity Assessment: Generalized weakness;Right hand dominant;Overall [facility] for tasks assessed   Lower Extremity Assessment Lower Extremity Assessment: Defer to PT evaluation   Cervical / Trunk Assessment Cervical / Trunk Assessment: Normal   Communication Communication Communication: No apparent difficulties;Other (comment) Factors Affecting Communication: Other (comment) (pt with intelligable speech/sounds at times)   Cognition Arousal: Alert Behavior During Therapy: Impulsive, Lability Cognition: No family/caregiver present to determine baseline             OT - Cognition Comments: Unsure of baseline, pt perseverating on needing nose spray to breathe despite knowledge that RN is aware and having O2 in nose. Pe also with intelligable speech/sounds when in pain, needed maximal cues to not yell, communicate and to PLB                 Following commands: Impaired Following commands impaired: Follows one step commands inconsistently     Cueing  General Comments   Cueing Techniques: Verbal cues  VSS - pt perseverating on needing nose spray to breathe    Home Living Family/patient expects to be discharged to:: Skilled nursing facility       Additional Comments: LTC at [facility], 3 years      Prior Functioning/Environment Prior Level of Function : Independent/Modified Independent             Mobility Comments:  Pt reports she can get out of bed on her own, transfers using AD, ambulates to the bathroom using rollator, otherwise in a manual w/c. SHe states staff sometimes helps her with mobility ADLs Comments: Pt reports she sponge bathes at baseline. She can get herself dressed, toileted, groomed. Staff manage meds and meals.            OT Goals(Current goals can be found in the care plan section)   Acute Rehab OT Goals Patient Stated Goal: nose spray OT Goal Formulation: With patient Time For Goal Achievement: 11/06/23 Potential to Achieve Goals: Fair ADL Goals Pt Will Perform Lower Body Dressing: with min assist;sit to/from stand Pt Will Transfer to Toilet: with min assist;ambulating;bedside commode Additional ADL Goal #1: pt will complete bed mobility with min A as a precursor to ADLs   OT Frequency:  Min 2X/week       AM-PAC OT 6 Clicks Daily Activity     Outcome Measure Help from another person eating meals?: None Help from another person taking care of personal grooming?: A Little Help from another person toileting, which includes using toliet, bedpan, or urinal?: Total Help from another person bathing (including washing, rinsing, drying)?: A Lot Help from another person to put on and taking off regular upper body clothing?:  A Little Help from another person to put on and taking off regular lower body clothing?: A Lot 6 Click Score: 15   End of Session Equipment Utilized During Treatment: Oxygen  Nurse Communication: Mobility status  Activity Tolerance: Patient tolerated treatment well Patient left: in bed;with call bell/phone within reach;with bed alarm set  OT Visit Diagnosis: Unsteadiness on feet (R26.81);Other abnormalities of gait and mobility (R26.89);Muscle weakness (generalized) (M62.81);History of falling (Z91.81);Pain                Time: 8997-8968 OT Time Calculation (min): 29 min Charges:  OT General Charges $OT Visit: 1 Visit OT Evaluation $OT Eval  Moderate Complexity: 1 Mod  Lucie Kendall, OTR/L Acute Rehabilitation Services Office [phone] Secure Chat Communication Preferred   Lucie JONETTA Kendall 10/23/2023, 1:00 PM

## 2023-10-23 NOTE — Progress Notes (Signed)
 Initial Nutrition Assessment  DOCUMENTATION CODES:   Not applicable  INTERVENTION:  Continue regular diet  Add Mighty Shakes BID Encourage PO intake    NUTRITION DIAGNOSIS:   Increased nutrient needs related to post-op healing as evidenced by  (hip fracture).   GOAL:   Patient will meet greater than or equal to 90% of their needs   MONITOR:   PO intake, Supplement acceptance  REASON FOR ASSESSMENT:   Consult Assessment of nutrition requirement/status (Hip/Femur Fx)  ASSESSMENT:   medical history significant of Dm2.  Carotid artery stenosis, CAD status post CABG in 1997, depression hypertension GERD HLD restless leg, chronic diastolic CHF multiple falls, hx of stroke here after a fall with a R intertrochanteric femur fracture.  Patient seen in room. Day 1 post op IM rod right hip Pt reports that she did not always like the food at her SNF so her PO intake varied depending on if she liked what she was served. Reports that she would supplement meals with Mighty Shakes. Pt keeps track of her weight at baseline, reports weight has slowly been increasing and she last checked her weight at 179 lbs.  Pt not feeling as hungry today after surgery yesterday but did eat some of her breakfast this morning. Discussed with patient adding mighty shake with meals to supplement PO intake and increase protein consumption for post op healing.   Meds:  SSI 0-9 units q 4 hrs   Labs: Glu 248, BUN 33, Cr 1.09, GFR 51, A1c 7.5  NUTRITION - FOCUSED PHYSICAL EXAM:  Flowsheet Row Most Recent Value  Orbital Region No depletion  Upper Arm Region No depletion  Thoracic and Lumbar Region No depletion  Buccal Region No depletion  Temple Region No depletion  Clavicle Bone Region No depletion  Clavicle and Acromion Bone Region No depletion  Scapular Bone Region No depletion  Dorsal Hand No depletion  Patellar Region No depletion  Anterior Thigh Region No depletion  Posterior Calf Region No  depletion  Hair Reviewed  Eyes Reviewed  Mouth Reviewed  Skin Reviewed  Nails Reviewed    Diet Order:   Diet Order             Diet regular Room service appropriate? Yes; Fluid consistency: Thin  Diet effective now                   EDUCATION NEEDS:   Education needs have been addressed  Skin:  Skin Assessment: Skin Integrity Issues: Skin Integrity Issues:: Incisions Incisions: right thigh  Last BM:  10/14  Height:   Ht Readings from Last 1 Encounters:  10/21/23 5' 2 (1.575 m)    Weight:   Wt Readings from Last 1 Encounters:  10/21/23 84.1 kg    Ideal Body Weight:  50 kg  BMI:  Body mass index is 33.91 kg/m.  Estimated Nutritional Needs:   Kcal:  1400-1600 kcals  Protein:  50-60 grams  Fluid:  1.4-1.6L/d  Madalyn Potters, MS, RD, LDN Clinical Dietitian  Contact via secure chat. If unavailable, use group chat RD Inpatient.

## 2023-10-24 DIAGNOSIS — S72141A Displaced intertrochanteric fracture of right femur, initial encounter for closed fracture: Secondary | ICD-10-CM | POA: Diagnosis not present

## 2023-10-24 DIAGNOSIS — I5032 Chronic diastolic (congestive) heart failure: Secondary | ICD-10-CM | POA: Diagnosis not present

## 2023-10-24 DIAGNOSIS — I272 Pulmonary hypertension, unspecified: Secondary | ICD-10-CM | POA: Diagnosis not present

## 2023-10-24 LAB — CBC
HCT: 22.5 % — ABNORMAL LOW (ref 36.0–46.0)
Hemoglobin: 7.4 g/dL — ABNORMAL LOW (ref 12.0–15.0)
MCH: 34.1 pg — ABNORMAL HIGH (ref 26.0–34.0)
MCHC: 32.9 g/dL (ref 30.0–36.0)
MCV: 103.7 fL — ABNORMAL HIGH (ref 80.0–100.0)
Platelets: 123 K/uL — ABNORMAL LOW (ref 150–400)
RBC: 2.17 MIL/uL — ABNORMAL LOW (ref 3.87–5.11)
RDW: 16.2 % — ABNORMAL HIGH (ref 11.5–15.5)
WBC: 8.6 K/uL (ref 4.0–10.5)
nRBC: 0 % (ref 0.0–0.2)

## 2023-10-24 LAB — GLUCOSE, CAPILLARY
Glucose-Capillary: 152 mg/dL — ABNORMAL HIGH (ref 70–99)
Glucose-Capillary: 199 mg/dL — ABNORMAL HIGH (ref 70–99)
Glucose-Capillary: 193 mg/dL — ABNORMAL HIGH (ref 70–99)
Glucose-Capillary: 168 mg/dL — ABNORMAL HIGH (ref 70–99)
Glucose-Capillary: 273 mg/dL — ABNORMAL HIGH (ref 70–99)

## 2023-10-24 LAB — BASIC METABOLIC PANEL WITH GFR
Anion gap: 10 (ref 5–15)
BUN: 34 mg/dL — ABNORMAL HIGH (ref 8–23)
CO2: 31 mmol/L (ref 22–32)
Calcium: 9.1 mg/dL (ref 8.9–10.3)
Chloride: 96 mmol/L — ABNORMAL LOW (ref 98–111)
Creatinine, Ser: 0.97 mg/dL (ref 0.44–1.00)
GFR, Estimated: 58 mL/min — ABNORMAL LOW (ref >60)
Glucose, Bld: 162 mg/dL — ABNORMAL HIGH (ref 70–99)
Potassium: 4.3 mmol/L (ref 3.5–5.1)
Sodium: 137 mmol/L (ref 135–145)

## 2023-10-24 LAB — PREPARE RBC (CROSSMATCH)

## 2023-10-24 LAB — IRON AND TIBC
Iron: 53 ug/dL (ref 28–170)
Saturation Ratios: 22 % (ref 10.4–31.8)
TIBC: 244 ug/dL — ABNORMAL LOW (ref 250–450)
UIBC: 191 ug/dL

## 2023-10-24 LAB — FOLATE: Folate: 15.5 ng/mL (ref >5.9)

## 2023-10-24 LAB — HEMOGLOBIN AND HEMATOCRIT, BLOOD
HCT: 27.6 % — ABNORMAL LOW (ref 36.0–46.0)
Hemoglobin: 9 g/dL — ABNORMAL LOW (ref 12.0–15.0)

## 2023-10-24 LAB — FERRITIN: Ferritin: 150 ng/mL (ref 11–307)

## 2023-10-24 LAB — VITAMIN B12: Vitamin B-12: 195 pg/mL (ref 180–914)

## 2023-10-24 MED ORDER — VITAMIN B-12 1000 MCG PO TABS
1000.0000 ug | ORAL_TABLET | Freq: Every day | ORAL | Status: DC
  Administered 2023-10-24 – 2023-10-27 (×4): 1000 ug via ORAL
  Filled 2023-10-24 (×4): qty 1

## 2023-10-24 MED ORDER — SODIUM CHLORIDE 0.9% IV SOLUTION: Freq: Once | INTRAVENOUS | Status: AC

## 2023-10-24 MED ORDER — OXYCODONE HCL 5 MG PO TABS
5.0000 mg | ORAL_TABLET | ORAL | Status: DC | PRN
  Administered 2023-10-25 – 2023-10-27 (×5): 5 mg via ORAL
  Filled 2023-10-24 (×7): qty 1

## 2023-10-24 MED ORDER — INSULIN ASPART 100 UNIT/ML IJ SOLN
0.0000 [IU] | Freq: Every day | INTRAMUSCULAR | Status: DC
  Administered 2023-10-24: 3 [IU] via SUBCUTANEOUS
  Administered 2023-10-25 – 2023-10-26 (×2): 2 [IU] via SUBCUTANEOUS

## 2023-10-24 MED ORDER — ACETAMINOPHEN 325 MG PO TABS: 650.0000 mg | ORAL_TABLET | Freq: Four times a day (QID) | ORAL | Status: DC | PRN

## 2023-10-24 MED ORDER — ACETAMINOPHEN 500 MG PO TABS
1000.0000 mg | ORAL_TABLET | Freq: Three times a day (TID) | ORAL | Status: DC
  Administered 2023-10-24 – 2023-10-27 (×10): 1000 mg via ORAL
  Filled 2023-10-24 (×10): qty 2

## 2023-10-24 MED ORDER — ASPIRIN 81 MG PO TBEC
81.0000 mg | DELAYED_RELEASE_TABLET | Freq: Every day | ORAL | Status: DC
  Administered 2023-10-24 – 2023-10-27 (×4): 81 mg via ORAL
  Filled 2023-10-24 (×4): qty 1

## 2023-10-24 MED ORDER — INSULIN ASPART 100 UNIT/ML IJ SOLN
0.0000 [IU] | Freq: Three times a day (TID) | INTRAMUSCULAR | Status: DC
  Administered 2023-10-24 (×2): 2 [IU] via SUBCUTANEOUS
  Administered 2023-10-25: 1 [IU] via SUBCUTANEOUS
  Administered 2023-10-25: 3 [IU] via SUBCUTANEOUS
  Administered 2023-10-25: 5 [IU] via SUBCUTANEOUS
  Administered 2023-10-26: 3 [IU] via SUBCUTANEOUS
  Administered 2023-10-26: 2 [IU] via SUBCUTANEOUS
  Administered 2023-10-26: 5 [IU] via SUBCUTANEOUS
  Administered 2023-10-27: 2 [IU] via SUBCUTANEOUS
  Administered 2023-10-27: 5 [IU] via SUBCUTANEOUS

## 2023-10-24 NOTE — Progress Notes (Signed)
 Orthopaedic Progress Note  S: Patient is resting comfortably in bed.  She reports sitting at the bedside yesterday but is not ambulated.  O:  Vitals:   10/23/23 2025 10/24/23 0425  BP: 127/66 (!) 134/57  Pulse: 74 66  Resp: 17 16  Temp: 98.8 F (37.1 C) 98.7 F (37.1 C)  SpO2: 100% 100%    Clean dressings on the right hip.  Thigh soft.  Neurovascular intact.    Labs:  Results for orders placed or performed during the hospital encounter of 10/21/23 (from the past 24 hours)  Glucose, capillary     Status: Abnormal   Collection Time: 10/23/23 11:41 AM  Result Value Ref Range   Glucose-Capillary 304 (H) 70 - 99 mg/dL  Glucose, capillary     Status: Abnormal   Collection Time: 10/23/23  4:09 PM  Result Value Ref Range   Glucose-Capillary 263 (H) 70 - 99 mg/dL  Glucose, capillary     Status: Abnormal   Collection Time: 10/23/23  8:24 PM  Result Value Ref Range   Glucose-Capillary 308 (H) 70 - 99 mg/dL  Glucose, capillary     Status: Abnormal   Collection Time: 10/23/23 11:47 PM  Result Value Ref Range   Glucose-Capillary 239 (H) 70 - 99 mg/dL  Glucose, capillary     Status: Abnormal   Collection Time: 10/24/23  4:23 AM  Result Value Ref Range   Glucose-Capillary 152 (H) 70 - 99 mg/dL  CBC     Status: Abnormal   Collection Time: 10/24/23  4:33 AM  Result Value Ref Range   WBC 8.6 4.0 - 10.5 K/uL   RBC 2.17 (L) 3.87 - 5.11 MIL/uL   Hemoglobin 7.4 (L) 12.0 - 15.0 g/dL   HCT 77.4 (L) 63.9 - 53.9 %   MCV 103.7 (H) 80.0 - 100.0 fL   MCH 34.1 (H) 26.0 - 34.0 pg   MCHC 32.9 30.0 - 36.0 g/dL   RDW 83.7 (H) 88.4 - 84.4 %   Platelets 123 (L) 150 - 400 K/uL   nRBC 0.0 0.0 - 0.2 %  Basic metabolic panel     Status: Abnormal   Collection Time: 10/24/23  4:33 AM  Result Value Ref Range   Sodium 137 135 - 145 mmol/L   Potassium 4.3 3.5 - 5.1 mmol/L   Chloride 96 (L) 98 - 111 mmol/L   CO2 31 22 - 32 mmol/L   Glucose, Bld 162 (H) 70 - 99 mg/dL   BUN 34 (H) 8 - 23 mg/dL    Creatinine, Ser 9.02 0.44 - 1.00 mg/dL   Calcium  9.1 8.9 - 10.3 mg/dL   GFR, Estimated 58 (L) >60 mL/min   Anion gap 10 5 - 15  Ferritin     Status: None   Collection Time: 10/24/23  4:33 AM  Result Value Ref Range   Ferritin 150 11 - 307 ng/mL  Folate     Status: None   Collection Time: 10/24/23  4:33 AM  Result Value Ref Range   Folate 15.5 >5.9 ng/mL  Vitamin B12     Status: None   Collection Time: 10/24/23  4:33 AM  Result Value Ref Range   Vitamin B-12 195 180 - 914 pg/mL  Iron and TIBC     Status: Abnormal   Collection Time: 10/24/23  4:33 AM  Result Value Ref Range   Iron 53 28 - 170 ug/dL   TIBC 755 (L) 749 - 549 ug/dL   Saturation Ratios 22 10.4 -  31.8 %   UIBC 191 ug/dL  Prepare RBC (crossmatch)     Status: None   Collection Time: 10/24/23  5:00 AM  Result Value Ref Range   Order Confirmation      ORDER PROCESSED BY BLOOD BANK Performed at [facility], [address] [place], [place] [address]     Assessment: Postop day 2 status post IM rod right hip fracture  Patient is doing well.  She continue be weight-bear as tolerated.  She continue work with physical therapy.  Continue with Lovenox  for DVT prophylaxis.  We can look to discharge her to rehab when appropriate with medicine and cleared from therapy.  She should follow-up with me in 2 weeks time in the office     Cordella Rhein, MD, MS [facility] [phone]

## 2023-10-24 NOTE — Plan of Care (Signed)
  Problem: Metabolic: Goal: Ability to maintain appropriate glucose levels will improve Outcome: Progressing   Problem: Education: Goal: Knowledge of General Education information will improve Description: Including pain rating scale, medication(s)/side effects and non-pharmacologic comfort measures Outcome: Progressing   Problem: Clinical Measurements: Goal: Ability to maintain clinical measurements within normal limits will improve Outcome: Progressing   Problem: Activity: Goal: Risk for activity intolerance will decrease Outcome: Progressing   Problem: Coping: Goal: Level of anxiety will decrease Outcome: Progressing

## 2023-10-24 NOTE — Progress Notes (Signed)
 PROGRESS NOTE    Andrea Kirk  FMW:[id number] DOB: [birth year] DOA: 10/21/2023 PCP: System, Provider Not In  Chief Complaint  Patient presents with   Fall    Brief Narrative:   Andrea Kirk is Andrea Kirk 83 y.o. female with medical history significant of Dm2.  Carotid artery stenosis, CAD status post CABG in 1997, depression hypertension GERD HLD restless leg, chronic diastolic CHF multiple falls, hx of stroke here after Andrea Kirk fall with Andrea Kirk R intertrochanteric femur fracture.      Assessment & Plan:   Principal Problem:   Displaced intertrochanteric fracture of right femur, initial encounter for closed fracture [facility]) Active Problems:   Coronary artery disease   Carotid artery disease   Chronic diastolic CHF (congestive heart failure) (HCC)   hx of CVA (cerebral vascular accident) (HCC)   Pulmonary hypertension, unspecified (HCC)   Controlled type 2 diabetes mellitus with hyperglycemia (HCC)   Essential hypertension   HLD (hyperlipidemia)   Iron deficiency anemia   Chronic respiratory failure with hypoxia (HCC)   Preop examination   Fall  Acute Comminuted and Displaced R Intertrochanteric Femur Fracture 10/15 s/p surgery with orthopedics  WBAT Post op care per ortho   Pending therapy eval  Macrocytic Anemia Post Op Anemia Post op Hb trended down to 7 S/p 1 unit pRBC Transfuse additional unit pRBC with goal Hb >8  Goal Hb >8 with cardiac issues below B12 low normal, supplement, folate wnl, iron wnl, ferritin wnl  CAD s/p CABG 1997 Hx CVA Dyslipidemia Crestor  Aspirin /brilinta  currently on hold - looks like she's been on ASA/brilinta  for Justyne Roell few years, appears this was from 04/2020 stroke? Will need to clarify if ongoing indication for DAPT - (per cardiology note, can d/c brilinta  moving forward) Aspirin  being resumed today Continue coreg  at reduced dose  HFpEF Pulmonary Hypertension Appears euvolemic Will follow, echo with EF >75%, severely elevated PASP Coreg  reduced dose,  jardiance on hold Holding lasix  for now  Hypertension Reduced dose coreg   Hold arb   T2DM Holding glipizide  SSI and basal insulin   A1c 7.5  GERD PPI  Depression  Anxiety Celexa , buspar     DVT prophylaxis: loveno Code Status: full Family Communication: brother Disposition:   Status is: Inpatient Remains inpatient appropriate because: need for post op care   Consultants:  Cardiology orthopedics  Procedures:  10/15 FIXATION, FRACTURE, INTERTROCHANTERIC, WITH INTRAMEDULLARY ROD   Echo IMPRESSIONS     1. Left ventricular ejection fraction, by estimation, is >75%. The left  ventricle has hyperdynamic function. The left ventricle has no regional  wall motion abnormalities. Left ventricular diastolic parameters are  consistent with Grade II diastolic  dysfunction (pseudonormalization).   2. Right ventricular systolic function is normal. The right ventricular  size is normal. There is severely elevated pulmonary artery systolic  pressure.   3. Left atrial size was moderately dilated.   4. Right atrial size was mildly dilated.   5. The mitral valve is normal in structure. Trivial mitral valve  regurgitation. No evidence of mitral stenosis.   6. The aortic valve is tricuspid. Aortic valve regurgitation is not  visualized. Mild aortic valve stenosis.   7. Aortic dilatation noted. There is borderline dilatation of the  ascending aorta, measuring 39 mm.   8. The inferior vena cava is normal in size with greater than 50%  respiratory variability, suggesting right atrial pressure of 3 mmHg.   Antimicrobials:  Anti-infectives (From admission, onward)    Start     Dose/Rate Route Frequency  Ordered Stop   10/23/23 0600  ceFAZolin  (ANCEF ) IVPB 2g/100 mL premix        2 g 200 mL/hr over 30 Minutes Intravenous On call to O.R. 10/22/23 1212 10/22/23 1408   10/22/23 2000  ceFAZolin  (ANCEF ) IVPB 2g/100 mL premix        2 g 200 mL/hr over 30 Minutes Intravenous Every 6  hours 10/22/23 1642 10/23/23 0941       Subjective:  C/o continued pain  Objective: Vitals:   10/24/23 1124 10/24/23 1129 10/24/23 1134 10/24/23 1139  BP: (!) 126/53 (!) 110/52 116/69 106/77  Pulse: 64 (!) 58 (!) 59 (!) 58  Resp: 18 17 16 16   Temp: 98.3 F (36.8 C) 98.3 F (36.8 C) 98.3 F (36.8 C) 98.3 F (36.8 C)  TempSrc: Oral   Oral  SpO2: 98%     Weight:      Height:        Intake/Output Summary (Last 24 hours) at 10/24/2023 1247 Last data filed at 10/24/2023 0900 Gross per 24 hour  Intake 657.92 ml  Output 1200 ml  Net -542.08 ml   Filed Weights   10/21/23 2013 10/21/23 2039  Weight: 80.7 kg 84.1 kg    Examination:  General: No acute distress. Cardiovascular: RRR Lungs: unlabored Neurological: Alert . Moves all extremities 4 with equal strength. Cranial nerves II through XII grossly intact. Extremities: RLE with intact dressing   Data Reviewed: I have personally reviewed following labs and imaging studies  CBC: Recent Labs  Lab 10/21/23 2020 10/21/23 2024 10/22/23 0442 10/23/23 0431 10/24/23 0433  WBC  --  6.1 8.6 9.2 8.6  HGB 11.6* 10.4* 9.8* 7.0* 7.4*  HCT 34.0* 31.9* 30.1* 21.4* 22.5*  MCV  --  107.4* 104.9* 106.5* 103.7*  PLT  --  189 166 149* 123*    Basic Metabolic Panel: Recent Labs  Lab 10/21/23 2020 10/21/23 2024 10/22/23 0442 10/23/23 0431 10/24/23 0433  NA 135 134* 134* 138 137  K 4.5 4.4 3.9 4.2 4.3  CL 92* 92* 94* 97* 96*  CO2  --  32 29 31 31   GLUCOSE 334* 339* 186* 248* 162*  BUN 52* 49* 51* 33* 34*  CREATININE 1.30* 1.29* 1.23* 1.09* 0.97  CALCIUM   --  8.9 9.1 9.1 9.1  MG  --  2.2  --   --   --   PHOS  --  4.1  --   --   --     GFR: Estimated Creatinine Clearance: 45 mL/min (by C-G formula based on SCr of 0.97 mg/dL).  Liver Function Tests: Recent Labs  Lab 10/21/23 2024 10/22/23 0442  AST 63* 138*  ALT 52* 86*  ALKPHOS 115 107  BILITOT 0.8 0.8  PROT 7.1 6.8  ALBUMIN 3.4* 3.5    CBG: Recent  Labs  Lab 10/23/23 2024 10/23/23 2347 10/24/23 0423 10/24/23 0830 10/24/23 1151  GLUCAP 308* 239* 152* 199* 193*     Recent Results (from the past 240 hours)  Surgical PCR screen     Status: Abnormal   Collection Time: 10/22/23  3:54 AM   Specimen: Nasal Mucosa; Nasal Swab  Result Value Ref Range Status   MRSA, PCR NEGATIVE NEGATIVE Final   Staphylococcus aureus POSITIVE (Cloteal Isaacson) NEGATIVE Final    Comment: (NOTE) The Xpert SA Assay (FDA approved for NASAL specimens in patients 60 years of age and older), is one component of Kasi Lasky comprehensive surveillance program. It is not intended to diagnose infection nor to guide  or monitor treatment. Performed at [facility], [address] [place], [place] [address]          Radiology Studies: DG FEMUR, [place] 2 VIEWS RIGHT Result Date: 10/22/2023 CLINICAL DATA:  Intraoperative fluoroscopic images of proximal right femoral fracture. EXAM: DG FEMUR 2+V*R* COMPARISON:  None Available. FINDINGS: Multiple intraoperative fluoroscopic spot images are provided. Interval open reduction and proximal femoral nail fixation of Chevelle Coulson comminuted right intertrochanteric femur fracture with improved alignment. Total fluoroscopy time: 1 minute and 23 seconds Total dose: Radiation Exposure Index (as provided by the fluoroscopic device): 29.16 mGy air Kerma Please see intraoperative findings for further detail. IMPRESSION: Intraoperative fluoroscopic spot images of proximal femoral nail fixation of Zanden Colver comminuted right intertrochanteric femur fracture with improved alignment. Electronically Signed   By: Harrietta Sherry M.D.   On: 10/22/2023 18:26   DG C-Arm 1-60 Min-No Report Result Date: 10/22/2023 Fluoroscopy was utilized by the requesting physician.  No radiographic interpretation.        Scheduled Meds:  aspirin  EC  81 mg Oral Daily   busPIRone   10 mg Oral TID   carvedilol   6.25 mg Oral BID   citalopram   20 mg Oral Daily   vitamin B-12  1,000 mcg  Oral Daily   docusate sodium   100 mg Oral BID   enoxaparin  (LOVENOX ) injection  40 mg Subcutaneous Q24H   gabapentin   300 mg Oral TID   insulin  aspart  0-5 Units Subcutaneous QHS   insulin  aspart  0-9 Units Subcutaneous TID WC   insulin  glargine-yfgn  10 Units Subcutaneous Daily   mupirocin  ointment  1 Application Nasal BID   pantoprazole   40 mg Oral Daily   rosuvastatin   10 mg Oral Daily   Continuous Infusions:     LOS: 3 days    Time spent: over 30 min     Meliton Monte, MD [facility]   To contact the attending provider between 7A-7P or the covering provider during after hours 7P-7A, please log into the web site [email or web address] and access using universal [place] password for that web site. If you do not have the password, please call the hospital operator.  10/24/2023, 12:47 PM

## 2023-10-24 NOTE — Progress Notes (Signed)
 Pt seen in room alert/oriented x4 in no apparent distress, Per pt only the surgeon change the dressing, Pt's current dressing is CDI, No complaints verbalized.

## 2023-10-24 NOTE — NC FL2 (Signed)
 [place]  MEDICAID FL2 LEVEL OF CARE FORM     IDENTIFICATION  Patient Name: Andrea Kirk Birthdate: 08-11-1940 Sex: female Admission Date (Current Location): 10/21/2023  [place] and [place] Number:  [place] [id number] Facility and Address:  [facility] [place], [address] [place], [place] [address]      Provider Number: [id number]  Attending Physician Name and Address:  Perri DELENA Meliton Mickey., *  Relative Name and Phone Number:  Lucking,Dr [name] Burrows [phone]    Current Level of Care: Hospital Recommended Level of Care: Skilled Nursing Facility Prior Approval Number:    Date Approved/Denied:   PASRR Number: [id number]  Discharge Plan: SNF    Current Diagnoses: Patient Active Problem List   Diagnosis Date Noted   Preop examination 10/22/2023   Fall 10/22/2023   Displaced intertrochanteric fracture of right femur, initial encounter for closed fracture (HCC) 10/21/2023   Chronic respiratory failure with hypoxia (HCC) 10/21/2023   Bilateral sensorineural hearing loss 07/25/2022   Sepsis secondary to UTI (HCC) 05/28/2022   Nausea 02/17/2021   C. difficile colitis 02/15/2021   Elevated troponin 02/14/2021   Gram-negative bacteremia 02/14/2021   Severe sepsis (HCC) 02/14/2021   Thrombocytopenia 02/13/2021   Acute encephalopathy 02/13/2021   Anxiety and depression 02/13/2021   Closed fracture of acromial process of right scapula 11/21/2020   Syncope, vasovagal 06/23/2020   Pulmonary hypertension, unspecified (HCC) 04/20/2020   Controlled type 2 diabetes mellitus with hyperglycemia (HCC) 04/20/2020   Essential hypertension 04/20/2020   HLD (hyperlipidemia) 04/20/2020   Acute renal failure superimposed on stage 2 chronic kidney disease 04/20/2020   Iron deficiency anemia 04/20/2020   Acute respiratory failure with hypoxia (HCC) 04/19/2020   Obese 04/19/2020   Chronic back pain 04/19/2020   Acute stroke due to ischemia 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   Depression 12/22/2017   Chronic pain 12/22/2017   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   Chronic diastolic CHF (congestive heart failure) (HCC) 12/22/2017   Frequent falls 07/16/2016   Carotid artery disease 04/16/2013   Coronary artery disease 03/11/2013   Occlusion and stenosis of carotid artery without mention of cerebral infarction 07/19/2011    Orientation RESPIRATION BLADDER Height & Weight     Self, Time, Situation, Place  Normal Incontinent, External catheter Weight: 185 lb 6.5 oz (84.1 kg) Height:  5' 2 (157.5 cm)  BEHAVIORAL SYMPTOMS/MOOD NEUROLOGICAL BOWEL NUTRITION STATUS        Diet (see discharge summary)  AMBULATORY STATUS COMMUNICATION OF NEEDS Skin   Total Care Verbally Surgical wounds, Skin abrasions                       Personal Care Assistance Level of Assistance  Bathing, Feeding, Dressing, Total care Bathing Assistance: Maximum assistance Feeding assistance: Independent Dressing Assistance: Maximum assistance Total Care Assistance: Maximum assistance   Functional Limitations Info  Sight, Hearing, Speech Sight Info: Adequate Hearing Info: Adequate Speech Info: Adequate    SPECIAL CARE FACTORS FREQUENCY  PT (By licensed PT), OT (By licensed OT)     PT Frequency: 5x week OT Frequency: 5x week            Contractures Contractures Info: Not present    Additional Factors Info  Code Status, Allergies, Insulin  Sliding Scale Code Status Info: full Allergies Info: Latex, Codeine, Morphine  And Codeine, Cyclobenzaprine   Insulin   Sliding Scale Info: Novolog : see discharge summary       Current Medications (10/24/2023):  This is the current hospital active medication list Current Facility-Administered Medications  Medication Dose Route Frequency Provider Last Rate Last Admin   acetaminophen   (TYLENOL ) tablet 1,000 mg  1,000 mg Oral Q8H Perri DELENA Meliton Mickey., MD   1,000 mg at 10/24/23 1504   Followed by   NOREEN ON 10/31/2023] acetaminophen  (TYLENOL ) tablet 650 mg  650 mg Oral Q6H PRN Perri DELENA Meliton Mickey., MD       aspirin  EC tablet 81 mg  81 mg Oral Daily [name], MD   81 mg at 10/24/23 1200   busPIRone  (BUSPAR ) tablet 10 mg  10 mg Oral TID Perri DELENA Meliton Mickey., MD   10 mg at 10/24/23 1604   carvedilol  (COREG ) tablet 6.25 mg  6.25 mg Oral BID Perri DELENA Meliton Mickey., MD   6.25 mg at 10/24/23 0900   citalopram  (CELEXA ) tablet 20 mg  20 mg Oral Daily Doutova, Anastassia, MD   20 mg at 10/24/23 0900   cyanocobalamin  (VITAMIN B12) tablet 1,000 mcg  1,000 mcg Oral Daily Perri DELENA Meliton Mickey., MD   1,000 mcg at 10/24/23 1048   docusate sodium  (COLACE) capsule 100 mg  100 mg Oral BID Gebauer, Anddy Wingert P, MD   100 mg at 10/24/23 0900   enoxaparin  (LOVENOX ) injection 40 mg  40 mg Subcutaneous Q24H Reyne Cordella SQUIBB, MD   40 mg at 10/24/23 9145   fentaNYL  (SUBLIMAZE ) injection 50 mcg  50 mcg Intravenous Q1H PRN Doutova, Anastassia, MD   50 mcg at 10/22/23 0216   gabapentin  (NEURONTIN ) capsule 300 mg  300 mg Oral TID Perri DELENA Meliton Mickey., MD   300 mg at 10/24/23 1604   insulin  aspart (novoLOG ) injection 0-5 Units  0-5 Units Subcutaneous QHS Perri DELENA Meliton Mickey., MD       insulin  aspart (novoLOG ) injection 0-9 Units  0-9 Units Subcutaneous TID WC Perri DELENA Meliton Mickey., MD   2 Units at 10/24/23 1158   insulin  glargine-yfgn (SEMGLEE) injection 10 Units  10 Units Subcutaneous Daily Perri DELENA Meliton Mickey., MD   10 Units at 10/24/23 1100   methocarbamol  (ROBAXIN ) tablet 500 mg  500 mg Oral Q6H PRN Doutova, Anastassia, MD   500 mg at 10/24/23 1048   Or   methocarbamol  (ROBAXIN ) injection 500 mg  500 mg Intravenous Q6H PRN Doutova, Anastassia, MD   500 mg at 10/22/23 1120   mupirocin  ointment (BACTROBAN ) 2 % 1 Application  1 Application Nasal BID Doutova, Anastassia, MD   1  Application at 10/23/23 2046   ondansetron  (ZOFRAN ) tablet 4 mg  4 mg Oral Q6H PRN Gebauer, Theressa Piedra P, MD   4 mg at 10/23/23 9086   Or   ondansetron  (ZOFRAN ) injection 4 mg  4 mg Intravenous Q6H PRN Reyne Cordella SQUIBB, MD       oxyCODONE  (Oxy IR/ROXICODONE ) immediate release tablet 5 mg  5 mg Oral Q4H PRN Perri DELENA Meliton Mickey., MD       pantoprazole  (PROTONIX ) EC tablet 40 mg  40 mg Oral Daily Doutova, Anastassia, MD   40 mg at 10/24/23 0900   polyethylene glycol (MIRALAX / GLYCOLAX) packet 17 g  17 g Oral Daily PRN Reyne Cordella SQUIBB, MD       rosuvastatin  (CRESTOR ) tablet 10 mg  10 mg Oral Daily Doutova, Anastassia, MD   10 mg at 10/24/23 0900     Discharge Medications: Please  see discharge summary for a list of discharge medications.  Relevant Imaging Results:  Relevant Lab Results:   Additional Information SS#: [id number]  Bridget Cordella Simmonds, LCSW

## 2023-10-24 NOTE — Progress Notes (Signed)
 Rounding Note   Patient Name: Andrea Kirk Date of Encounter: 10/24/2023  [facility] Cardiologist: Lonni CROME Nanas, MD   Subjective BP 117/40. Cr 0.97.  Hgb 7.4. Denies any chest pain or dyspnea  Scheduled Meds:  sodium chloride    Intravenous Once   busPIRone   10 mg Oral TID   carvedilol   6.25 mg Oral BID   citalopram   20 mg Oral Daily   vitamin B-12  1,000 mcg Oral Daily   docusate sodium   100 mg Oral BID   enoxaparin  (LOVENOX ) injection  40 mg Subcutaneous Q24H   gabapentin   300 mg Oral TID   insulin  aspart  0-5 Units Subcutaneous QHS   insulin  aspart  0-9 Units Subcutaneous TID WC   insulin  glargine-yfgn  10 Units Subcutaneous Daily   mupirocin  ointment  1 Application Nasal BID   pantoprazole   40 mg Oral Daily   rosuvastatin   10 mg Oral Daily   Continuous Infusions:  PRN Meds: fentaNYL  (SUBLIMAZE ) injection, HYDROcodone -acetaminophen , methocarbamol  **OR** methocarbamol  (ROBAXIN ) injection, ondansetron  **OR** ondansetron  (ZOFRAN ) IV, polyethylene glycol   Vital Signs  Vitals:   10/23/23 1603 10/23/23 2025 10/24/23 0425 10/24/23 0741  BP: 126/61 127/66 (!) 134/57 (!) 117/40  Pulse: 76 74 66 66  Resp: 18 17 16 16   Temp: 98.5 F (36.9 C) 98.8 F (37.1 C) 98.7 F (37.1 C) 98.7 F (37.1 C)  TempSrc: Oral Oral Oral Oral  SpO2: 100% 100% 100% 99%  Weight:      Height:        Intake/Output Summary (Last 24 hours) at 10/24/2023 1107 Last data filed at 10/24/2023 0900 Gross per 24 hour  Intake 657.92 ml  Output 1200 ml  Net -542.08 ml      10/21/2023    8:39 PM 10/21/2023    8:13 PM 07/14/2023    1:09 PM  Last 3 Weights  Weight (lbs) 185 lb 6.5 oz 178 lb 179 lb 8 oz  Weight (kg) 84.1 kg 80.74 kg 81.421 kg      Telemetry Not on tele, will order - Personally Reviewed  ECG  No new ECG - Personally Reviewed  Physical Exam  GEN: No acute distress.   Neck: No JVD Cardiac: RRR, 2/6 systolic murmur Respiratory: Clear to auscultation  bilaterally. GI: Soft, nontender, non-distended  MS: No edema; No deformity. Neuro:  Nonfocal  Psych: Normal affect   Labs High Sensitivity Troponin:   Recent Labs  Lab 10/21/23 2024  TROPONINIHS 7     Chemistry Recent Labs  Lab 10/21/23 2024 10/22/23 0442 10/23/23 0431 10/24/23 0433  NA 134* 134* 138 137  K 4.4 3.9 4.2 4.3  CL 92* 94* 97* 96*  CO2 32 29 31 31   GLUCOSE 339* 186* 248* 162*  BUN 49* 51* 33* 34*  CREATININE 1.29* 1.23* 1.09* 0.97  CALCIUM  8.9 9.1 9.1 9.1  MG 2.2  --   --   --   PROT 7.1 6.8  --   --   ALBUMIN 3.4* 3.5  --   --   AST 63* 138*  --   --   ALT 52* 86*  --   --   ALKPHOS 115 107  --   --   BILITOT 0.8 0.8  --   --   GFRNONAA 41* 44* 51* 58*  ANIONGAP 10 11 10 10     Lipids No results for input(s): CHOL, TRIG, HDL, LABVLDL, LDLCALC, CHOLHDL in the last 168 hours.  Hematology Recent Labs  Lab 10/22/23  9557 10/23/23 0431 10/24/23 0433  WBC 8.6 9.2 8.6  RBC 2.87* 2.01* 2.17*  HGB 9.8* 7.0* 7.4*  HCT 30.1* 21.4* 22.5*  MCV 104.9* 106.5* 103.7*  MCH 34.1* 34.8* 34.1*  MCHC 32.6 32.7 32.9  RDW 13.1 13.2 16.2*  PLT 166 149* 123*   Thyroid  No results for input(s): TSH, FREET4 in the last 168 hours.  BNPNo results for input(s): BNP, PROBNP in the last 168 hours.  DDimer No results for input(s): DDIMER in the last 168 hours.   Radiology  DG FEMUR, MIN 2 VIEWS RIGHT Result Date: 10/22/2023 CLINICAL DATA:  Intraoperative fluoroscopic images of proximal right femoral fracture. EXAM: DG FEMUR 2+V*R* COMPARISON:  None Available. FINDINGS: Multiple intraoperative fluoroscopic spot images are provided. Interval open reduction and proximal femoral nail fixation of a comminuted right intertrochanteric femur fracture with improved alignment. Total fluoroscopy time: 1 minute and 23 seconds Total dose: Radiation Exposure Index (as provided by the fluoroscopic device): 29.16 mGy air Kerma Please see intraoperative findings for  further detail. IMPRESSION: Intraoperative fluoroscopic spot images of proximal femoral nail fixation of a comminuted right intertrochanteric femur fracture with improved alignment. Electronically Signed   By: Harrietta Sherry M.D.   On: 10/22/2023 18:26   DG C-Arm 1-60 Min-No Report Result Date: 10/22/2023 Fluoroscopy was utilized by the requesting physician.  No radiographic interpretation.   ECHOCARDIOGRAM COMPLETE Result Date: 10/22/2023    ECHOCARDIOGRAM REPORT   Patient Name:   Andrea Kirk Date of Exam: 10/22/2023 Medical Rec #:  [id number]     Height:       62.0 in Accession #:    [id number]    Weight:       185.4 lb Date of Birth:  25-Nov-1940      BSA:          1.851 m Patient Age:    83 years      BP:           131/51 mmHg Patient Gender: F             HR:           82 bpm. Exam Location:  Inpatient Procedure: 2D Echo, Color Doppler and Cardiac Doppler (Both Spectral and Color            Flow Doppler were utilized during procedure). Indications:    Pre Op Z01.810  History:        Patient has prior history of Echocardiogram examinations, most                 recent 02/15/2021. CAD, Stroke, Signs/Symptoms:Syncope; Risk                 Factors:Diabetes and Hypertension. H/O Hyperlipidemia.  Sonographer:    BERNARDA ROCKS Referring Phys: Domingo.Diones ANASTASSIA DOUTOVA IMPRESSIONS  1. Left ventricular ejection fraction, by estimation, is >75%. The left ventricle has hyperdynamic function. The left ventricle has no regional wall motion abnormalities. Left ventricular diastolic parameters are consistent with Grade II diastolic dysfunction (pseudonormalization).  2. Right ventricular systolic function is normal. The right ventricular size is normal. There is severely elevated pulmonary artery systolic pressure.  3. Left atrial size was moderately dilated.  4. Right atrial size was mildly dilated.  5. The mitral valve is normal in structure. Trivial mitral valve regurgitation. No evidence of mitral stenosis.  6. The  aortic valve is tricuspid. Aortic valve regurgitation is not visualized. Mild aortic valve stenosis.  7. Aortic dilatation noted. There is borderline  dilatation of the ascending aorta, measuring 39 mm.  8. The inferior vena cava is normal in size with greater than 50% respiratory variability, suggesting right atrial pressure of 3 mmHg. FINDINGS  Left Ventricle: Left ventricular ejection fraction, by estimation, is >75%. The left ventricle has hyperdynamic function. The left ventricle has no regional wall motion abnormalities. The left ventricular internal cavity size was normal in size. There is no left ventricular hypertrophy. Left ventricular diastolic parameters are consistent with Grade II diastolic dysfunction (pseudonormalization). Right Ventricle: The right ventricular size is normal. Right ventricular systolic function is normal. There is severely elevated pulmonary artery systolic pressure. The tricuspid regurgitant velocity is 4.36 m/s, and with an assumed right atrial pressure  of 3 mmHg, the estimated right ventricular systolic pressure is 79.0 mmHg. Left Atrium: Left atrial size was moderately dilated. Right Atrium: Right atrial size was mildly dilated. Pericardium: There is no evidence of pericardial effusion. Mitral Valve: The mitral valve is normal in structure. Mild mitral annular calcification. Trivial mitral valve regurgitation. No evidence of mitral valve stenosis. MV peak gradient, 4.8 mmHg. The mean mitral valve gradient is 2.0 mmHg. Tricuspid Valve: The tricuspid valve is normal in structure. Tricuspid valve regurgitation is mild . No evidence of tricuspid stenosis. Aortic Valve: The aortic valve is tricuspid. Aortic valve regurgitation is not visualized. Mild aortic stenosis is present. Aortic valve mean gradient measures 9.5 mmHg. Aortic valve peak gradient measures 20.3 mmHg. Aortic valve area, by VTI measures 1.68 cm. Pulmonic Valve: The pulmonic valve was normal in structure. Pulmonic  valve regurgitation is not visualized. No evidence of pulmonic stenosis. Aorta: Aortic dilatation noted. There is borderline dilatation of the ascending aorta, measuring 39 mm. Venous: The inferior vena cava is normal in size with greater than 50% respiratory variability, suggesting right atrial pressure of 3 mmHg. IAS/Shunts: No atrial level shunt detected by color flow Doppler.  LEFT VENTRICLE PLAX 2D LVIDd:         4.80 cm      Diastology LVIDs:         3.00 cm      LV e' medial:    11.20 cm/s LV PW:         0.80 cm      LV E/e' medial:  10.5 LV IVS:        0.90 cm      LV e' lateral:   14.90 cm/s LVOT diam:     1.60 cm      LV E/e' lateral: 7.9 LV SV:         73 LV SV Index:   40 LVOT Area:     2.01 cm  LV Volumes (MOD) LV vol d, MOD A2C: 125.0 ml LV vol d, MOD A4C: 121.0 ml LV vol s, MOD A2C: 36.7 ml LV vol s, MOD A4C: 34.2 ml LV SV MOD A2C:     88.3 ml LV SV MOD A4C:     121.0 ml LV SV MOD BP:      88.5 ml RIGHT VENTRICLE             IVC RV Basal diam:  4.40 cm     IVC diam: 1.40 cm RV S prime:     11.50 cm/s TAPSE (M-mode): 1.6 cm RVSP:           79.0 mmHg LEFT ATRIUM             Index        RIGHT ATRIUM  Index LA diam:        4.40 cm 2.38 cm/m   RA Pressure: 3.00 mmHg LA Vol (A2C):   96.3 ml 52.02 ml/m  RA Area:     18.90 cm LA Vol (A4C):   59.8 ml 32.30 ml/m  RA Volume:   60.60 ml  32.74 ml/m LA Biplane Vol: 76.7 ml 41.43 ml/m  AORTIC VALVE                     PULMONIC VALVE AV Area (Vmax):    1.53 cm      PV Vmax:          1.38 m/s AV Area (Vmean):   1.55 cm      PV Peak grad:     7.6 mmHg AV Area (VTI):     1.68 cm      PR End Diast Vel: 3.96 msec AV Vmax:           225.50 cm/s AV Vmean:          141.000 cm/s AV VTI:            0.436 m AV Peak Grad:      20.3 mmHg AV Mean Grad:      9.5 mmHg LVOT Vmax:         172.00 cm/s LVOT Vmean:        109.000 cm/s LVOT VTI:          0.365 m LVOT/AV VTI ratio: 0.84  AORTA Ao Root diam: 3.00 cm Ao Asc diam:  3.90 cm MITRAL VALVE                 TRICUSPID VALVE MV Area (PHT): 3.93 cm     TR Peak grad:   76.0 mmHg MV Area VTI:   2.64 cm     TR Mean grad:   41.0 mmHg MV Peak grad:  4.8 mmHg     TR Vmax:        436.00 cm/s MV Mean grad:  2.0 mmHg     TR Vmean:       300.0 cm/s MV Vmax:       1.09 m/s     Estimated RAP:  3.00 mmHg MV Vmean:      68.7 cm/s    RVSP:           79.0 mmHg MV Decel Time: 193 msec MV E velocity: 118.00 cm/s  SHUNTS MV A velocity: 83.20 cm/s   Systemic VTI:  0.36 m MV E/A ratio:  1.42         Systemic Diam: 1.60 cm Redell Shallow MD Electronically signed by Redell Shallow MD Signature Date/Time: 10/22/2023/1:22:44 PM    Final     Cardiac Studies   Patient Profile   83 y.o. female with medical history significant of Dm2.  Carotid artery stenosis, CAD status post CABG in 1997, depression hypertension GERD HLD restless leg, chronic diastolic CHF multiple falls, hx of stroke here after a fall with a R intertrochanteric femur fracture.    Assessment & Plan  Pulmonary hypertension/Chronic diastolic heart failure: severe on echo this admission.  Normal RV size/function.  Suspected group 2 in setting of chronic diastolic heart failure, can consider further work-up as outpatient. -Currently appears euvolemic  CAD: s/p CABG in 1997.  Denies any anginal symptoms.  Was on ASA and brilinta  as outpatient.  Can discontinue brilinta  moving forward.  Would restart ASA 81 mg daily   AS: mild  on echo, will monitor as outpatient  Anemia: Hgb 7.0 this morning, would transfuse for goal Hgb>8 given cardiac history  R Intertrochanteric Femur Fracture  s/p surgery on 10/15.  Did well during surgery  [facility] will sign off.   Medication Recommendations:  ASA 81 mg daily.  Discontinue Brilinta  Other recommendations (labs, testing, etc):  None Follow up as an outpatient:  Scheduled for 11/18   For questions or updates, please contact [facility] Please consult [email or web address] for contact info under     Signed, Lonni LITTIE Nanas, MD  10/24/2023, 11:07 AM

## 2023-10-24 NOTE — TOC Initial Note (Signed)
 Transition of Care [facility]) - Initial/Assessment Note    Patient Details  Name: Andrea Kirk MRN: [id number] Date of Birth: 1940-01-14  Transition of Care [facility]) CM/SW Contact:    Bridget Cordella Simmonds, LCSW Phone Number: 10/24/2023, 4:24 PM  Clinical Narrative:    CSW spoke with pt regarding PT recommendation for SNF. Pt confirms she is LTC resident at [facility], already aware that she will be returning to the rehab side and is in agreement with this plan.  Permission given to speak with brother Andrea Kirk.  CSW message with [facility], who confirms pt can return but they do not have bed available currently.                Expected Discharge Plan: Skilled Nursing Facility Barriers to Discharge: Continued Medical Work up   Patient Goals and CMS Choice Patient states their goals for this hospitalization and ongoing recovery are:: walking   Choice offered to / list presented to : Patient (pt confirms plan to return to [place])      Expected Discharge Plan and Services In-house Referral: Clinical Social Work   Post Acute Care Choice: Skilled Nursing Facility Living arrangements for the past 2 months: Skilled Nursing Facility                                      Prior Living Arrangements/Services Living arrangements for the past 2 months: Skilled Nursing Facility Lives with:: Facility Resident Patient language and need for interpreter reviewed:: Yes Do you feel safe going back to the place where you live?: Yes      Need for Family Participation in Patient Care: Yes (Comment) Care giver support system in place?: Yes (comment) Current home services: Other (comment) (na) Criminal Activity/Legal Involvement Pertinent to Current Situation/Hospitalization: No - Comment as needed  Activities of Daily Living   ADL Screening (condition at time of admission) Independently performs ADLs?: No Does the patient have a NEW difficulty with  bathing/dressing/toileting/self-feeding that is expected to last >3 days?: Yes (Initiates electronic notice to provider for possible OT consult) Does the patient have a NEW difficulty with getting in/out of bed, walking, or climbing stairs that is expected to last >3 days?: Yes (Initiates electronic notice to provider for possible PT consult) Does the patient have a NEW difficulty with communication that is expected to last >3 days?: No Is the patient deaf or have difficulty hearing?: No Does the patient have difficulty seeing, even when wearing glasses/contacts?: No Does the patient have difficulty concentrating, remembering, or making decisions?: No  Permission Sought/Granted Permission sought to share information with : Family Supports Permission granted to share information with : Yes, Verbal Permission Granted  Share Information with NAME: brother Andrea Kirk  Permission granted to share info w AGENCY: [facility]        Emotional Assessment Appearance:: Appears stated age Attitude/Demeanor/Rapport: Engaged Affect (typically observed): Appropriate, Pleasant Orientation: : Oriented to Self, Oriented to Place, Oriented to  Time, Oriented to Situation      Admission diagnosis:  Closed right hip fracture (HCC) [S72.001A] Fall, initial encounter [W19.XXXA] Displaced intertrochanteric fracture of right femur, initial encounter for closed fracture [facility]) [S72.141A] Patient Active Problem List   Diagnosis Date Noted   Preop examination 10/22/2023   Fall 10/22/2023   Displaced intertrochanteric fracture of right femur, initial encounter for closed fracture (HCC) 10/21/2023   Chronic respiratory failure with hypoxia (HCC) 10/21/2023   Bilateral sensorineural  hearing loss 07/25/2022   Sepsis secondary to UTI (HCC) 05/28/2022   Nausea 02/17/2021   C. difficile colitis 02/15/2021   Elevated troponin 02/14/2021   Gram-negative bacteremia 02/14/2021   Severe sepsis (HCC) 02/14/2021    Thrombocytopenia 02/13/2021   Acute encephalopathy 02/13/2021   Anxiety and depression 02/13/2021   Closed fracture of acromial process of right scapula 11/21/2020   Syncope, vasovagal 06/23/2020   Pulmonary hypertension, unspecified (HCC) 04/20/2020   Controlled type 2 diabetes mellitus with hyperglycemia (HCC) 04/20/2020   Essential hypertension 04/20/2020   HLD (hyperlipidemia) 04/20/2020   Acute renal failure superimposed on stage 2 chronic kidney disease 04/20/2020   Iron deficiency anemia 04/20/2020   Acute respiratory failure with hypoxia (HCC) 04/19/2020   Obese 04/19/2020   Chronic back pain 04/19/2020   Acute stroke due to ischemia [facility])    hx of CVA (cerebral vascular accident) (HCC) 04/16/2020   Macular retinoschisis, 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   Vitreomacular traction syndrome, 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   Depression 12/22/2017   Chronic pain 12/22/2017   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   Chronic diastolic CHF (congestive heart failure) (HCC) 12/22/2017   Frequent falls 07/16/2016   Carotid artery disease 04/16/2013   Coronary artery disease 03/11/2013   Occlusion and stenosis of carotid artery without mention of cerebral infarction 07/19/2011   PCP:  System, Provider Not In Pharmacy:   [facility] - [place], [place] - [address] [place] [address] Phone: [phone] Fax: [phone]     Social Drivers of Health (SDOH) Social History: SDOH Screenings   Food Insecurity: No Food Insecurity (10/22/2023)  Housing: Low Risk  (10/22/2023)  Transportation Needs: No Transportation Needs (10/22/2023)  Utilities: Not At Risk (10/22/2023)  Social Connections: Moderately Integrated (10/22/2023)  Tobacco Use: Low Risk  (10/22/2023)   SDOH Interventions:     Readmission Risk Interventions     No data to display

## 2023-10-24 NOTE — Plan of Care (Signed)
  Problem: Education: Goal: Knowledge of General Education information will improve Description: Including pain rating scale, medication(s)/side effects and non-pharmacologic comfort measures Outcome: Progressing   Problem: Clinical Measurements: Goal: Will remain free from infection Outcome: Progressing   Problem: Nutrition: Goal: Adequate nutrition will be maintained Outcome: Progressing   Problem: Coping: Goal: Level of anxiety will decrease Outcome: Progressing   Problem: Safety: Goal: Ability to remain free from injury will improve Outcome: Progressing

## 2023-10-25 DIAGNOSIS — S72141A Displaced intertrochanteric fracture of right femur, initial encounter for closed fracture: Secondary | ICD-10-CM | POA: Diagnosis not present

## 2023-10-25 LAB — TYPE AND SCREEN
ABO/RH(D): A POS
Antibody Screen: NEGATIVE
Unit division: 0
Unit division: 0

## 2023-10-25 LAB — BPAM RBC
Blood Product Expiration Date: 202511072359
Blood Product Expiration Date: 202511122359
ISSUE DATE / TIME: 202510161239
ISSUE DATE / TIME: 202510171114
Unit Type and Rh: 6200
Unit Type and Rh: 6200

## 2023-10-25 LAB — CBC WITH DIFFERENTIAL/PLATELET
Abs Immature Granulocytes: 0.04 K/uL (ref 0.00–0.07)
Basophils Absolute: 0.1 K/uL (ref 0.0–0.1)
Basophils Relative: 1 %
Eosinophils Absolute: 0.2 K/uL (ref 0.0–0.5)
Eosinophils Relative: 3 %
HCT: 26.8 % — ABNORMAL LOW (ref 36.0–46.0)
Hemoglobin: 9 g/dL — ABNORMAL LOW (ref 12.0–15.0)
Immature Granulocytes: 1 %
Lymphocytes Relative: 26 %
Lymphs Abs: 1.8 K/uL (ref 0.7–4.0)
MCH: 33.5 pg (ref 26.0–34.0)
MCHC: 33.6 g/dL (ref 30.0–36.0)
MCV: 99.6 fL (ref 80.0–100.0)
Monocytes Absolute: 0.8 K/uL (ref 0.1–1.0)
Monocytes Relative: 12 %
Neutro Abs: 3.9 K/uL (ref 1.7–7.7)
Neutrophils Relative %: 57 %
Platelets: 124 K/uL — ABNORMAL LOW (ref 150–400)
RBC: 2.69 MIL/uL — ABNORMAL LOW (ref 3.87–5.11)
RDW: 19 % — ABNORMAL HIGH (ref 11.5–15.5)
WBC: 6.8 K/uL (ref 4.0–10.5)
nRBC: 0 % (ref 0.0–0.2)

## 2023-10-25 LAB — COMPREHENSIVE METABOLIC PANEL WITH GFR
ALT: 14 U/L (ref 0–44)
AST: 34 U/L (ref 15–41)
Albumin: 2.7 g/dL — ABNORMAL LOW (ref 3.5–5.0)
Alkaline Phosphatase: 69 U/L (ref 38–126)
Anion gap: 13 (ref 5–15)
BUN: 26 mg/dL — ABNORMAL HIGH (ref 8–23)
CO2: 25 mmol/L (ref 22–32)
Calcium: 9.1 mg/dL (ref 8.9–10.3)
Chloride: 98 mmol/L (ref 98–111)
Creatinine, Ser: 0.8 mg/dL (ref 0.44–1.00)
GFR, Estimated: >60 mL/min (ref >60)
Glucose, Bld: 204 mg/dL — ABNORMAL HIGH (ref 70–99)
Potassium: 4.5 mmol/L (ref 3.5–5.1)
Sodium: 136 mmol/L (ref 135–145)
Total Bilirubin: 0.9 mg/dL (ref 0.0–1.2)
Total Protein: 6.2 g/dL — ABNORMAL LOW (ref 6.5–8.1)

## 2023-10-25 LAB — MAGNESIUM: Magnesium: 2.1 mg/dL (ref 1.7–2.4)

## 2023-10-25 LAB — GLUCOSE, CAPILLARY
Glucose-Capillary: 198 mg/dL — ABNORMAL HIGH (ref 70–99)
Glucose-Capillary: 146 mg/dL — ABNORMAL HIGH (ref 70–99)
Glucose-Capillary: 275 mg/dL — ABNORMAL HIGH (ref 70–99)
Glucose-Capillary: 242 mg/dL — ABNORMAL HIGH (ref 70–99)

## 2023-10-25 LAB — PHOSPHORUS: Phosphorus: 3 mg/dL (ref 2.5–4.6)

## 2023-10-25 NOTE — Plan of Care (Signed)

## 2023-10-25 NOTE — Progress Notes (Signed)
 PROGRESS NOTE    Andrea Kirk  FMW:[id number] DOB: [birth year] DOA: 10/21/2023 PCP: System, Provider Not In  Chief Complaint  Patient presents with   Fall    Brief Narrative:   Andrea Kirk is Andrea Kirk 83 y.o. female with medical history significant of Dm2.  Carotid artery stenosis, CAD status post CABG in 1997, depression hypertension GERD HLD restless leg, chronic diastolic CHF multiple falls, hx of stroke here after [name] fall with [name] R intertrochanteric femur fracture.      Assessment & Plan:   Principal Problem:   Displaced intertrochanteric fracture of right femur, initial encounter for closed fracture [facility]) Active Problems:   Coronary artery disease   Carotid artery disease   Chronic diastolic CHF (congestive heart failure) (HCC)   hx of CVA (cerebral vascular accident) (HCC)   Pulmonary hypertension, unspecified (HCC)   Controlled type 2 diabetes mellitus with hyperglycemia (HCC)   Essential hypertension   HLD (hyperlipidemia)   Iron deficiency anemia   Chronic respiratory failure with hypoxia (HCC)   Preop examination   Fall  Acute Comminuted and Displaced R Intertrochanteric Femur Fracture 10/15 s/p surgery with orthopedics  WBAT Post op care per ortho   Pending therapy eval  Macrocytic Anemia Post Op Anemia Post op Hb trended down to 7 S/p 1 unit pRBC Transfuse additional unit pRBC with goal Hb >8  Goal Hb >8 with cardiac issues below B12 low normal, supplement, folate wnl, iron wnl, ferritin wnl  CAD s/p CABG 1997 Hx CVA Dyslipidemia Crestor  Aspirin /brilinta  currently on hold - looks like she's been on ASA/brilinta  for [name] few years, appears this was from 04/2020 stroke? Will need to clarify if ongoing indication for DAPT - (per cardiology note, can d/c brilinta  moving forward) Aspirin  being resumed today Continue coreg  at reduced dose  HFpEF Pulmonary Hypertension Appears euvolemic Will follow, echo with EF >75%, severely elevated PASP Coreg  reduced dose,  jardiance on hold Holding lasix  for now  Hypertension Reduced dose coreg   Hold arb   T2DM Holding glipizide  SSI and basal insulin   A1c 7.5  GERD PPI  Depression  Anxiety Celexa , buspar     DVT prophylaxis: loveno Code Status: full Family Communication: none Disposition:   Status is: Inpatient Remains inpatient appropriate because: need for post op care   Consultants:  Cardiology orthopedics  Procedures:  10/15 FIXATION, FRACTURE, INTERTROCHANTERIC, WITH INTRAMEDULLARY ROD   Echo IMPRESSIONS     1. Left ventricular ejection fraction, by estimation, is >75%. The left  ventricle has hyperdynamic function. The left ventricle has no regional  wall motion abnormalities. Left ventricular diastolic parameters are  consistent with Grade II diastolic  dysfunction (pseudonormalization).   2. Right ventricular systolic function is normal. The right ventricular  size is normal. There is severely elevated pulmonary artery systolic  pressure.   3. Left atrial size was moderately dilated.   4. Right atrial size was mildly dilated.   5. The mitral valve is normal in structure. Trivial mitral valve  regurgitation. No evidence of mitral stenosis.   6. The aortic valve is tricuspid. Aortic valve regurgitation is not  visualized. Mild aortic valve stenosis.   7. Aortic dilatation noted. There is borderline dilatation of the  ascending aorta, measuring 39 mm.   8. The inferior vena cava is normal in size with greater than 50%  respiratory variability, suggesting right atrial pressure of 3 mmHg.   Antimicrobials:  Anti-infectives (From admission, onward)    Start     Dose/Rate Route Frequency  Ordered Stop   10/23/23 0600  ceFAZolin  (ANCEF ) IVPB 2g/100 mL premix        2 g 200 mL/hr over 30 Minutes Intravenous On call to O.R. 10/22/23 1212 10/22/23 1408   10/22/23 2000  ceFAZolin  (ANCEF ) IVPB 2g/100 mL premix        2 g 200 mL/hr over 30 Minutes Intravenous Every 6 hours  10/22/23 1642 10/23/23 0941       Subjective:  C/o pain  Objective: Vitals:   10/25/23 0440 10/25/23 0759 10/25/23 1255 10/25/23 1530  BP: 131/63 (!) 158/66 (!) 151/47 (!) 130/45  Pulse: 60 (!) 57 64 67  Resp: 16 18 16    Temp: 98.2 F (36.8 C) 98.1 F (36.7 C)    TempSrc: Oral Oral  Oral  SpO2: 100% 100% 99% 99%  Weight:      Height:        Intake/Output Summary (Last 24 hours) at 10/25/2023 1714 Last data filed at 10/25/2023 1631 Gross per 24 hour  Intake --  Output 2100 ml  Net -2100 ml   Filed Weights   10/21/23 2013 10/21/23 2039  Weight: 80.7 kg 84.1 kg    Examination:  General: uncomfortable today, c/o pain  Cardiovascular: RRR Lungs: unlabored Abdomen: Soft, nontender, nondistended  Neurological: Alert and oriented 3. Moves all extremities 4 with equal strength. Cranial nerves II through XII grossly intact. Extremities: RLE with intact dressing - soft compartments    Data Reviewed: I have personally reviewed following labs and imaging studies  CBC: Recent Labs  Lab 10/21/23 2024 10/22/23 0442 10/23/23 0431 10/24/23 0433 10/24/23 1800 10/25/23 0618  WBC 6.1 8.6 9.2 8.6  --  6.8  NEUTROABS  --   --   --   --   --  3.9  HGB 10.4* 9.8* 7.0* 7.4* 9.0* 9.0*  HCT 31.9* 30.1* 21.4* 22.5* 27.6* 26.8*  MCV 107.4* 104.9* 106.5* 103.7*  --  99.6  PLT 189 166 149* 123*  --  124*    Basic Metabolic Panel: Recent Labs  Lab 10/21/23 2024 10/22/23 0442 10/23/23 0431 10/24/23 0433 10/25/23 0618  NA 134* 134* 138 137 136  K 4.4 3.9 4.2 4.3 4.5  CL 92* 94* 97* 96* 98  CO2 32 29 31 31 25   GLUCOSE 339* 186* 248* 162* 204*  BUN 49* 51* 33* 34* 26*  CREATININE 1.29* 1.23* 1.09* 0.97 0.80  CALCIUM  8.9 9.1 9.1 9.1 9.1  MG 2.2  --   --   --  2.1  PHOS 4.1  --   --   --  3.0    GFR: Estimated Creatinine Clearance: 54.5 mL/min (by C-G formula based on SCr of 0.8 mg/dL).  Liver Function Tests: Recent Labs  Lab 10/21/23 2024 10/22/23 0442  10/25/23 0618  AST 63* 138* 34  ALT 52* 86* 14  ALKPHOS 115 107 69  BILITOT 0.8 0.8 0.9  PROT 7.1 6.8 6.2*  ALBUMIN 3.4* 3.5 2.7*    CBG: Recent Labs  Lab 10/24/23 1620 10/24/23 2104 10/25/23 0628 10/25/23 1240 10/25/23 1534  GLUCAP 168* 273* 198* 146* 275*     Recent Results (from the past 240 hours)  Surgical PCR screen     Status: Abnormal   Collection Time: 10/22/23  3:54 AM   Specimen: Nasal Mucosa; Nasal Swab  Result Value Ref Range Status   MRSA, PCR NEGATIVE NEGATIVE Final   Staphylococcus aureus POSITIVE (Imir Brumbach) NEGATIVE Final    Comment: (NOTE) The Xpert SA Assay (FDA approved  for NASAL specimens in patients 66 years of age and older), is one component of Daron Stutz comprehensive surveillance program. It is not intended to diagnose infection nor to guide or monitor treatment. Performed at [facility], [address] [place], [place] [address]          Radiology Studies: No results found.       Scheduled Meds:  acetaminophen   1,000 mg Oral Q8H   aspirin  EC  81 mg Oral Daily   busPIRone   10 mg Oral TID   carvedilol   6.25 mg Oral BID   citalopram   20 mg Oral Daily   vitamin B-12  1,000 mcg Oral Daily   docusate sodium   100 mg Oral BID   enoxaparin  (LOVENOX ) injection  40 mg Subcutaneous Q24H   gabapentin   300 mg Oral TID   insulin  aspart  0-5 Units Subcutaneous QHS   insulin  aspart  0-9 Units Subcutaneous TID WC   insulin  glargine-yfgn  10 Units Subcutaneous Daily   mupirocin  ointment  1 Application Nasal BID   pantoprazole   40 mg Oral Daily   rosuvastatin   10 mg Oral Daily   Continuous Infusions:     LOS: 4 days    Time spent: over 30 min     Meliton Monte, MD [facility]   To contact the attending provider between 7A-7P or the covering provider during after hours 7P-7A, please log into the web site [email or web address] and access using universal [place] password for that web site. If you do not have the password, please call  the hospital operator.  10/25/2023, 5:14 PM

## 2023-10-26 DIAGNOSIS — S72141A Displaced intertrochanteric fracture of right femur, initial encounter for closed fracture: Secondary | ICD-10-CM | POA: Diagnosis not present

## 2023-10-26 LAB — CBC WITH DIFFERENTIAL/PLATELET
Abs Immature Granulocytes: 0.03 K/uL (ref 0.00–0.07)
Basophils Absolute: 0.1 K/uL (ref 0.0–0.1)
Basophils Relative: 1 %
Eosinophils Absolute: 0.2 K/uL (ref 0.0–0.5)
Eosinophils Relative: 4 %
HCT: 27.6 % — ABNORMAL LOW (ref 36.0–46.0)
Hemoglobin: 9 g/dL — ABNORMAL LOW (ref 12.0–15.0)
Immature Granulocytes: 1 %
Lymphocytes Relative: 24 %
Lymphs Abs: 1.4 K/uL (ref 0.7–4.0)
MCH: 33.6 pg (ref 26.0–34.0)
MCHC: 32.6 g/dL (ref 30.0–36.0)
MCV: 103 fL — ABNORMAL HIGH (ref 80.0–100.0)
Monocytes Absolute: 0.7 K/uL (ref 0.1–1.0)
Monocytes Relative: 12 %
Neutro Abs: 3.5 K/uL (ref 1.7–7.7)
Neutrophils Relative %: 58 %
Platelets: 142 K/uL — ABNORMAL LOW (ref 150–400)
RBC: 2.68 MIL/uL — ABNORMAL LOW (ref 3.87–5.11)
RDW: 17 % — ABNORMAL HIGH (ref 11.5–15.5)
WBC: 5.9 K/uL (ref 4.0–10.5)
nRBC: 0 % (ref 0.0–0.2)

## 2023-10-26 LAB — COMPREHENSIVE METABOLIC PANEL WITH GFR
ALT: 13 U/L (ref 0–44)
AST: 24 U/L (ref 15–41)
Albumin: 2.6 g/dL — ABNORMAL LOW (ref 3.5–5.0)
Alkaline Phosphatase: 70 U/L (ref 38–126)
Anion gap: 9 (ref 5–15)
BUN: 23 mg/dL (ref 8–23)
CO2: 32 mmol/L (ref 22–32)
Calcium: 9.4 mg/dL (ref 8.9–10.3)
Chloride: 99 mmol/L (ref 98–111)
Creatinine, Ser: 0.85 mg/dL (ref 0.44–1.00)
GFR, Estimated: >60 mL/min (ref >60)
Glucose, Bld: 193 mg/dL — ABNORMAL HIGH (ref 70–99)
Potassium: 4.7 mmol/L (ref 3.5–5.1)
Sodium: 140 mmol/L (ref 135–145)
Total Bilirubin: 0.8 mg/dL (ref 0.0–1.2)
Total Protein: 6.2 g/dL — ABNORMAL LOW (ref 6.5–8.1)

## 2023-10-26 LAB — PHOSPHORUS: Phosphorus: 3.5 mg/dL (ref 2.5–4.6)

## 2023-10-26 LAB — GLUCOSE, CAPILLARY
Glucose-Capillary: 185 mg/dL — ABNORMAL HIGH (ref 70–99)
Glucose-Capillary: 289 mg/dL — ABNORMAL HIGH (ref 70–99)
Glucose-Capillary: 332 mg/dL — ABNORMAL HIGH (ref 70–99)
Glucose-Capillary: 219 mg/dL — ABNORMAL HIGH (ref 70–99)
Glucose-Capillary: 205 mg/dL — ABNORMAL HIGH (ref 70–99)

## 2023-10-26 LAB — MAGNESIUM: Magnesium: 2.1 mg/dL (ref 1.7–2.4)

## 2023-10-26 NOTE — Progress Notes (Signed)
 PROGRESS NOTE    Andrea Kirk  FMW:[id number] DOB: 1940-08-25 DOA: 10/21/2023 PCP: System, Provider Not In  Chief Complaint  Patient presents with   Fall    Brief Narrative:   Andrea Kirk is Andrea Kirk 83 y.o. female with medical history significant of Dm2.  Carotid artery stenosis, CAD status post CABG in 1997, depression hypertension GERD HLD restless leg, chronic diastolic CHF multiple falls, hx of stroke here after Andrea Kirk fall with Andrea Kirk intertrochanteric femur fracture.      Assessment & Plan:   Principal Problem:   Displaced intertrochanteric fracture of right femur, initial encounter for closed fracture [facility]) Active Problems:   Coronary artery disease   Carotid artery disease   Chronic diastolic CHF (congestive heart failure) (HCC)   hx of CVA (cerebral vascular accident) (HCC)   Pulmonary hypertension, unspecified (HCC)   Controlled type 2 diabetes mellitus with hyperglycemia (HCC)   Essential hypertension   HLD (hyperlipidemia)   Iron deficiency anemia   Chronic respiratory failure with hypoxia (HCC)   Preop examination   Fall  Acute Comminuted and Displaced Kirk Intertrochanteric Femur Fracture 10/15 s/p surgery with orthopedics  WBAT Post op care per ortho   Therapy recommending SNF  Macrocytic Anemia Post Op Anemia Post op Hb trended down to 7 S/p 1 unit pRBC Transfuse additional unit pRBC with goal Hb >8  Goal Hb >8 with cardiac issues below B12 low normal, supplement, folate wnl, iron wnl, ferritin wnl  CAD s/p CABG 1997 Hx CVA Dyslipidemia Crestor  Aspirin /brilinta  currently on hold - looks like she's been on ASA/brilinta  for [name] few years, appears this was from 04/2020 stroke? Will need to clarify if ongoing indication for DAPT - (per cardiology note, can d/c brilinta  moving forward) Aspirin  being resumed today Continue coreg  at reduced dose  HFpEF Pulmonary Hypertension Appears euvolemic Will follow, echo with EF >75%, severely elevated PASP Coreg  reduced dose,  jardiance on hold Holding lasix  for now  Hypertension Reduced dose coreg   Hold arb   T2DM Holding glipizide  SSI and basal insulin   A1c 7.5  GERD PPI  Depression  Anxiety Celexa , buspar     DVT prophylaxis: loveno Code Status: full Family Communication: none Disposition:   Status is: Inpatient Remains inpatient appropriate because: need for post op care   Consultants:  Cardiology orthopedics  Procedures:  10/15 FIXATION, FRACTURE, INTERTROCHANTERIC, WITH INTRAMEDULLARY ROD   Echo IMPRESSIONS     1. Left ventricular ejection fraction, by estimation, is >75%. The left  ventricle has hyperdynamic function. The left ventricle has no regional  wall motion abnormalities. Left ventricular diastolic parameters are  consistent with Grade II diastolic  dysfunction (pseudonormalization).   2. Right ventricular systolic function is normal. The right ventricular  size is normal. There is severely elevated pulmonary artery systolic  pressure.   3. Left atrial size was moderately dilated.   4. Right atrial size was mildly dilated.   5. The mitral valve is normal in structure. Trivial mitral valve  regurgitation. No evidence of mitral stenosis.   6. The aortic valve is tricuspid. Aortic valve regurgitation is not  visualized. Mild aortic valve stenosis.   7. Aortic dilatation noted. There is borderline dilatation of the  ascending aorta, measuring 39 mm.   8. The inferior vena cava is normal in size with greater than 50%  respiratory variability, suggesting right atrial pressure of 3 mmHg.   Antimicrobials:  Anti-infectives (From admission, onward)    Start     Dose/Rate Route Frequency  Ordered Stop   10/23/23 0600  ceFAZolin  (ANCEF ) IVPB 2g/100 mL premix        2 g 200 mL/hr over 30 Minutes Intravenous On call to O.Kirk. 10/22/23 1212 10/22/23 1408   10/22/23 2000  ceFAZolin  (ANCEF ) IVPB 2g/100 mL premix        2 g 200 mL/hr over 30 Minutes Intravenous Every 6 hours  10/22/23 1642 10/23/23 0941       Subjective:  Pain better today  Objective: Vitals:   10/26/23 0345 10/26/23 0759 10/26/23 0921 10/26/23 1515  BP: 133/61 (!) 146/54  (!) 126/50  Pulse: (!) 57 (!) 57 62 (!) 57  Resp: 16 15  17   Temp: 98.7 F (37.1 C) 97.7 F (36.5 C)  97.9 F (36.6 C)  TempSrc: Oral Oral  Oral  SpO2: 100% 100%  100%  Weight:      Height:        Intake/Output Summary (Last 24 hours) at 10/26/2023 1559 Last data filed at 10/26/2023 1300 Gross per 24 hour  Intake 120 ml  Output 1700 ml  Net -1580 ml   Filed Weights   10/21/23 2013 10/21/23 2039  Weight: 80.7 kg 84.1 kg    Examination:  General: No acute distress. Cardiovascular: RRR Lungs: unlabored Neurological: Alert and oriented 3. Moves all extremities 4 with equal strength. Cranial nerves II through XII grossly intact. Extremities: RLE with intact dressing     Data Reviewed: I have personally reviewed following labs and imaging studies  CBC: Recent Labs  Lab 10/22/23 0442 10/23/23 0431 10/24/23 0433 10/24/23 1800 10/25/23 0618 10/26/23 0747  WBC 8.6 9.2 8.6  --  6.8 5.9  NEUTROABS  --   --   --   --  3.9 3.5  HGB 9.8* 7.0* 7.4* 9.0* 9.0* 9.0*  HCT 30.1* 21.4* 22.5* 27.6* 26.8* 27.6*  MCV 104.9* 106.5* 103.7*  --  99.6 103.0*  PLT 166 149* 123*  --  124* 142*    Basic Metabolic Panel: Recent Labs  Lab 10/21/23 2024 10/22/23 0442 10/23/23 0431 10/24/23 0433 10/25/23 0618 10/26/23 0747  NA 134* 134* 138 137 136 140  K 4.4 3.9 4.2 4.3 4.5 4.7  CL 92* 94* 97* 96* 98 99  CO2 32 29 31 31 25  32  GLUCOSE 339* 186* 248* 162* 204* 193*  BUN 49* 51* 33* 34* 26* 23  CREATININE 1.29* 1.23* 1.09* 0.97 0.80 0.85  CALCIUM  8.9 9.1 9.1 9.1 9.1 9.4  MG 2.2  --   --   --  2.1 2.1  PHOS 4.1  --   --   --  3.0 3.5    GFR: Estimated Creatinine Clearance: 51.3 mL/min (by C-G formula based on SCr of 0.85 mg/dL).  Liver Function Tests: Recent Labs  Lab 10/21/23 2024  10/22/23 0442 10/25/23 0618 10/26/23 0747  AST 63* 138* 34 24  ALT 52* 86* 14 13  ALKPHOS 115 107 69 70  BILITOT 0.8 0.8 0.9 0.8  PROT 7.1 6.8 6.2* 6.2*  ALBUMIN 3.4* 3.5 2.7* 2.6*    CBG: Recent Labs  Lab 10/25/23 2116 10/26/23 0622 10/26/23 1112 10/26/23 1159 10/26/23 1501  GLUCAP 242* 185* 289* 332* 219*     Recent Results (from the past 240 hours)  Surgical PCR screen     Status: Abnormal   Collection Time: 10/22/23  3:54 AM   Specimen: Nasal Mucosa; Nasal Swab  Result Value Ref Range Status   MRSA, PCR NEGATIVE NEGATIVE Final   Staphylococcus aureus  POSITIVE (Jeniya Flannigan) NEGATIVE Final    Comment: (NOTE) The Xpert SA Assay (FDA approved for NASAL specimens in patients 78 years of age and older), is one component of Shakeerah Gradel comprehensive surveillance program. It is not intended to diagnose infection nor to guide or monitor treatment. Performed at [facility], [address] [place], [place] [address]          Radiology Studies: No results found.       Scheduled Meds:  acetaminophen   1,000 mg Oral Q8H   aspirin  EC  81 mg Oral Daily   busPIRone   10 mg Oral TID   carvedilol   6.25 mg Oral BID   citalopram   20 mg Oral Daily   vitamin B-12  1,000 mcg Oral Daily   docusate sodium   100 mg Oral BID   enoxaparin  (LOVENOX ) injection  40 mg Subcutaneous Q24H   gabapentin   300 mg Oral TID   insulin  aspart  0-5 Units Subcutaneous QHS   insulin  aspart  0-9 Units Subcutaneous TID WC   insulin  glargine-yfgn  10 Units Subcutaneous Daily   mupirocin  ointment  1 Application Nasal BID   pantoprazole   40 mg Oral Daily   rosuvastatin   10 mg Oral Daily   Continuous Infusions:     LOS: 5 days    Time spent: over 30 min     Meliton Monte, MD [facility]   To contact the attending provider between 7A-7P or the covering provider during after hours 7P-7A, please log into the web site [email or web address] and access using universal [place] password for that  web site. If you do not have the password, please call the hospital operator.  10/26/2023, 3:59 PM

## 2023-10-26 NOTE — Plan of Care (Signed)
  Problem: Nutritional: Goal: Maintenance of adequate nutrition will improve Outcome: Progressing   Problem: Skin Integrity: Goal: Risk for impaired skin integrity will decrease Outcome: Progressing   Problem: Activity: Goal: Risk for activity intolerance will decrease Outcome: Progressing   Problem: Pain Managment: Goal: General experience of comfort will improve and/or be controlled Outcome: Progressing   Problem: Safety: Goal: Ability to remain free from injury will improve Outcome: Progressing

## 2023-10-27 DIAGNOSIS — S72141A Displaced intertrochanteric fracture of right femur, initial encounter for closed fracture: Secondary | ICD-10-CM | POA: Diagnosis not present

## 2023-10-27 LAB — CBC WITH DIFFERENTIAL/PLATELET
Abs Immature Granulocytes: 0.03 K/uL (ref 0.00–0.07)
Basophils Absolute: 0.1 K/uL (ref 0.0–0.1)
Basophils Relative: 1 %
Eosinophils Absolute: 0.2 K/uL (ref 0.0–0.5)
Eosinophils Relative: 4 %
HCT: 26 % — ABNORMAL LOW (ref 36.0–46.0)
Hemoglobin: 8.4 g/dL — ABNORMAL LOW (ref 12.0–15.0)
Immature Granulocytes: 1 %
Lymphocytes Relative: 29 %
Lymphs Abs: 1.6 K/uL (ref 0.7–4.0)
MCH: 33.2 pg (ref 26.0–34.0)
MCHC: 32.3 g/dL (ref 30.0–36.0)
MCV: 102.8 fL — ABNORMAL HIGH (ref 80.0–100.0)
Monocytes Absolute: 0.7 K/uL (ref 0.1–1.0)
Monocytes Relative: 12 %
Neutro Abs: 3.1 K/uL (ref 1.7–7.7)
Neutrophils Relative %: 53 %
Platelets: 173 K/uL (ref 150–400)
RBC: 2.53 MIL/uL — ABNORMAL LOW (ref 3.87–5.11)
RDW: 16.4 % — ABNORMAL HIGH (ref 11.5–15.5)
WBC: 5.7 K/uL (ref 4.0–10.5)
nRBC: 0 % (ref 0.0–0.2)

## 2023-10-27 LAB — COMPREHENSIVE METABOLIC PANEL WITH GFR
ALT: 24 U/L (ref 0–44)
AST: 42 U/L — ABNORMAL HIGH (ref 15–41)
Albumin: 2.5 g/dL — ABNORMAL LOW (ref 3.5–5.0)
Alkaline Phosphatase: 85 U/L (ref 38–126)
Anion gap: 8 (ref 5–15)
BUN: 27 mg/dL — ABNORMAL HIGH (ref 8–23)
CO2: 29 mmol/L (ref 22–32)
Calcium: 9 mg/dL (ref 8.9–10.3)
Chloride: 98 mmol/L (ref 98–111)
Creatinine, Ser: 0.84 mg/dL (ref 0.44–1.00)
GFR, Estimated: >60 mL/min (ref >60)
Glucose, Bld: 187 mg/dL — ABNORMAL HIGH (ref 70–99)
Potassium: 4.8 mmol/L (ref 3.5–5.1)
Sodium: 135 mmol/L (ref 135–145)
Total Bilirubin: 1 mg/dL (ref 0.0–1.2)
Total Protein: 6.2 g/dL — ABNORMAL LOW (ref 6.5–8.1)

## 2023-10-27 LAB — MAGNESIUM: Magnesium: 1.9 mg/dL (ref 1.7–2.4)

## 2023-10-27 LAB — GLUCOSE, CAPILLARY
Glucose-Capillary: 180 mg/dL — ABNORMAL HIGH (ref 70–99)
Glucose-Capillary: 178 mg/dL — ABNORMAL HIGH (ref 70–99)
Glucose-Capillary: 277 mg/dL — ABNORMAL HIGH (ref 70–99)

## 2023-10-27 LAB — PHOSPHORUS: Phosphorus: 3.5 mg/dL (ref 2.5–4.6)

## 2023-10-27 MED ORDER — ENOXAPARIN SODIUM 40 MG/0.4ML IJ SOSY: 40.0000 mg | PREFILLED_SYRINGE | INTRAMUSCULAR | 0 refills | Status: DC

## 2023-10-27 MED ORDER — CARVEDILOL 6.25 MG PO TABS: 6.2500 mg | ORAL_TABLET | Freq: Two times a day (BID) | ORAL | Status: DC

## 2023-10-27 MED ORDER — OXYCODONE HCL 5 MG PO TABS: 5.0000 mg | ORAL_TABLET | Freq: Four times a day (QID) | ORAL | 0 refills | Status: AC | PRN

## 2023-10-27 MED ORDER — FUROSEMIDE 20 MG PO TABS: 20.0000 mg | ORAL_TABLET | Freq: Every day | ORAL | 3 refills | Status: AC | PRN

## 2023-10-27 MED ORDER — CYANOCOBALAMIN 1000 MCG PO TABS: 1000.0000 ug | ORAL_TABLET | Freq: Every day | ORAL | Status: AC

## 2023-10-27 NOTE — TOC Progression Note (Signed)
 Transition of Care [facility]) - Progression Note    Patient Details  Name: Clifton Kovacic Gray MRN: [id number] Date of Birth: [birth year]  Transition of [facility]) CM/SW Contact  Bridget Cordella Simmonds, LCSW Phone Number: 10/27/2023, 1:29 PM  Clinical Narrative:  CSW confirmed with Rh[facility]: bed available today. New PT note needed for auth, PT aware.  1100: SNF auth request submitted in [employer] and approved: [id number], 3 days: 10/20-10/22.  MD informed.    Expected Discharge Plan: Skilled Nursing Facility Barriers to Discharge: Continued Medical Work up               Expected Discharge Plan and Services In-house Referral: Clinical Social Work   Post Acute Care Choice: Skilled Nursing Facility Living arrangements for the past 2 months: Skilled Nursing Facility Expected Discharge Date: 10/27/23                                     Social Drivers of Health (SDOH) Interventions SDOH Screenings   Food Insecurity: No Food Insecurity (10/22/2023)  Housing: Low Risk  (10/22/2023)  Transportation Needs: No Transportation Needs (10/22/2023)  Utilities: Not At Risk (10/22/2023)  Social Connections: Moderately Integrated (10/22/2023)  Tobacco Use: Low Risk  (10/22/2023)    Readmission Risk Interventions     No data to display

## 2023-10-27 NOTE — Plan of Care (Signed)
  Problem: Skin Integrity: Goal: Risk for impaired skin integrity will decrease Outcome: Progressing   Problem: Activity: Goal: Risk for activity intolerance will decrease Outcome: Progressing   Problem: Pain Managment: Goal: General experience of comfort will improve and/or be controlled Outcome: Progressing   Problem: Safety: Goal: Ability to remain free from injury will improve Outcome: Progressing   Problem: Skin Integrity: Goal: Risk for impaired skin integrity will decrease Outcome: Progressing

## 2023-10-27 NOTE — Plan of Care (Signed)
  Problem: Education: Goal: Ability to describe self-care measures that may prevent or decrease complications (Diabetes Survival Skills Education) will improve Outcome: Adequate for Discharge Goal: Individualized Educational Video(s) Outcome: Adequate for Discharge   Problem: Coping: Goal: Ability to adjust to condition or change in health will improve Outcome: Adequate for Discharge   Problem: Fluid Volume: Goal: Ability to maintain a balanced intake and output will improve Outcome: Adequate for Discharge   Problem: Health Behavior/Discharge Planning: Goal: Ability to identify and utilize available resources and services will improve Outcome: Adequate for Discharge Goal: Ability to manage health-related needs will improve Outcome: Adequate for Discharge   Problem: Metabolic: Goal: Ability to maintain appropriate glucose levels will improve Outcome: Adequate for Discharge   Problem: Nutritional: Goal: Maintenance of adequate nutrition will improve Outcome: Adequate for Discharge Goal: Progress toward achieving an optimal weight will improve Outcome: Adequate for Discharge   Problem: Skin Integrity: Goal: Risk for impaired skin integrity will decrease Outcome: Adequate for Discharge   Problem: Tissue Perfusion: Goal: Adequacy of tissue perfusion will improve Outcome: Adequate for Discharge   Problem: Education: Goal: Knowledge of General Education information will improve Description: Including pain rating scale, medication(s)/side effects and non-pharmacologic comfort measures Outcome: Adequate for Discharge   Problem: Health Behavior/Discharge Planning: Goal: Ability to manage health-related needs will improve Outcome: Adequate for Discharge   Problem: Clinical Measurements: Goal: Ability to maintain clinical measurements within normal limits will improve Outcome: Adequate for Discharge Goal: Will remain free from infection Outcome: Adequate for Discharge Goal:  Diagnostic test results will improve Outcome: Adequate for Discharge Goal: Respiratory complications will improve Outcome: Adequate for Discharge Goal: Cardiovascular complication will be avoided Outcome: Adequate for Discharge   Problem: Activity: Goal: Risk for activity intolerance will decrease Outcome: Adequate for Discharge   Problem: Nutrition: Goal: Adequate nutrition will be maintained Outcome: Adequate for Discharge   Problem: Coping: Goal: Level of anxiety will decrease Outcome: Adequate for Discharge   Problem: Elimination: Goal: Will not experience complications related to bowel motility Outcome: Adequate for Discharge Goal: Will not experience complications related to urinary retention Outcome: Adequate for Discharge   Problem: Pain Managment: Goal: General experience of comfort will improve and/or be controlled Outcome: Adequate for Discharge   Problem: Safety: Goal: Ability to remain free from injury will improve Outcome: Adequate for Discharge   Problem: Skin Integrity: Goal: Risk for impaired skin integrity will decrease Outcome: Adequate for Discharge   Problem: Acute Rehab OT Goals (only OT should resolve) Goal: Pt. Will Perform Lower Body Dressing Outcome: Adequate for Discharge Goal: Pt. Will Transfer To Toilet Outcome: Adequate for Discharge Goal: OT Additional ADL Goal #1 Outcome: Adequate for Discharge   Problem: Acute Rehab PT Goals(only PT should resolve) Goal: Pt will Roll Supine to Side Outcome: Adequate for Discharge Goal: Pt Will Go Supine/Side To Sit Outcome: Adequate for Discharge Goal: Pt Will Go Sit To Supine/Side Outcome: Adequate for Discharge Goal: Patient Will Perform Sitting Balance Outcome: Adequate for Discharge Goal: Patient Will Transfer Sit To/From Stand Outcome: Adequate for Discharge Goal: Pt Will Transfer Bed To Chair/Chair To Bed Outcome: Adequate for Discharge   Problem: Increased Nutrient Needs  (NI-5.1) Goal: Food and/or nutrient delivery Description: Individualized approach for food/nutrient provision. Outcome: Adequate for Discharge

## 2023-10-27 NOTE — Inpatient Diabetes Management (Signed)
 Inpatient Diabetes Program Recommendations  AACE/ADA: New Consensus Statement on Inpatient Glycemic Control   Target Ranges:  Prepandial:   less than 140 mg/dL      Peak postprandial:   less than 180 mg/dL (1-2 hours)      Critically ill patients:  140 - 180 mg/dL   Lab Results  Component Value Date   GLUCAP 178 (H) 10/27/2023   HGBA1C 7.5 (H) 10/21/2023    Latest Reference Range & Units 10/26/23 06:22 10/26/23 11:12 10/26/23 11:59 10/26/23 15:01 10/26/23 21:09 10/27/23 05:43 10/27/23 06:39  Glucose-Capillary 70 - 99 mg/dL 814 (H) 710 (H) 667 (H) 219 (H) 205 (H) 180 (H) 178 (H)   Review of Glycemic Control  Diabetes history: DM2  Outpatient Diabetes medications:  Glipizide  5mg  daily  Jardiance 25mg  daily   Current orders for Inpatient glycemic control:  Semglee 10 units daily  Novolog  0-6 units TID + 0-5 units at bedtime   Inpatient Diabetes Program Recommendations:  Please consider adding Novolog  3 units TID with meals (if patient consumes atleast 50% of meal).   Thanks,  Lavanda Search, RN, MSN, [facility]  Inpatient Diabetes Coordinator  Pager [phone] (8a-5p)

## 2023-10-27 NOTE — Discharge Summary (Addendum)
 Physician Discharge Summary  Andrea Kirk FMW:[id number] DOB: [birth year] DOA: 10/21/2023  PCP: System, Provider Not In  Admit date: 10/21/2023 Discharge date: 10/27/2023  Time spent: 40 minutes  Recommendations for Outpatient Follow-up:  Follow outpatient CBC/CMP  Follow with orthopedics outpatient in 2 weeks - 4 weeks lovenox  for DVT ppx per Dr. Reyne - WBAT Follow anemia outpatient - follow iron, b12 levels outpatient with supplementation Brilinta  d/c'd Follow blood pressure outpatient - adjust as indicated (valsartan on hold) Follow blood sugars outpatient on glipizide  and SGLT2 Follow pulm htn with cards outpatient   Discharge Diagnoses:  Principal Problem:   Displaced intertrochanteric fracture of right femur, initial encounter for closed fracture [facility]) Active Problems:   Coronary artery disease   Carotid artery disease   Chronic diastolic CHF (congestive heart failure) (HCC)   hx of CVA (cerebral vascular accident) (HCC)   Pulmonary hypertension, unspecified (HCC)   Controlled type 2 diabetes mellitus with hyperglycemia (HCC)   Essential hypertension   HLD (hyperlipidemia)   Iron deficiency anemia   Chronic respiratory failure with hypoxia (HCC)   Preop examination   Fall  Discharge Condition: stable  Diet recommendation: heart healthy, diabetic  Filed Weights   10/21/23 2013 10/21/23 2039  Weight: 80.7 kg 84.1 kg    History of present illness:   Andrea Kirk is Andrea Kirk 83 y.o. female with medical history significant of Dm2. Carotid artery stenosis, CAD status post CABG in 1997, depression hypertension GERD HLD restless leg, chronic diastolic CHF multiple falls, hx of stroke here after Andrea Kirk fall with Andrea Kirk R intertrochanteric femur fracture.   She's now s/p surgical repair with orthopedics on 10/15.    Hospitalization complicated by post op anemia requiring transfusion.    Stable for discharge, see below and prior notes for additional details.   Hospital Course:   Assessment and Plan:  Acute Comminuted and Displaced R Intertrochanteric Femur Fracture 10/15 s/p IM rod per orthopedics  WBAT Post op care per ortho  - discharging with lovenox  x4 weeks per discussion with Dr. Reyne  Therapy recommending SNF   Macrocytic Anemia Post Op Anemia Post op Hb trended down to 7 S/p 2 unit pRBC - Hb relatively stable today, follow outpatient Goal Hb >8 with cardiac issues below B12 low normal, supplement, folate wnl, iron wnl, ferritin wnl.  Continue iron supplementation.   CAD s/p CABG 1997 Hx CVA Dyslipidemia Crestor  Aspirin /brilinta  currently on hold - looks like she's been on ASA/brilinta  for Andrea Kirk few years, appears this was from 04/2020 stroke?  Per cardiology note, can d/c brilinta  moving forward. Continue aspirin  Continue coreg  at reduced dose   HFpEF Pulmonary Hypertension Appears euvolemic Will follow, echo with EF >75%, severely elevated PASP Coreg  reduced dose, jardiance  Resume lasix  prn at discharge   Hypertension Reduced dose coreg , amlodipine  Hold arb   T2DM Holding glipizide  SSI and basal insulin   A1c 7.5   GERD PPI   Depression  Anxiety Celexa , buspar   Obesity Body mass index is 33.91 kg/m.     Procedures: 10/15 FIXATION, FRACTURE, INTERTROCHANTERIC, WITH INTRAMEDULLARY ROD    Consultations: Orthopedics cardiology  Discharge Exam: Vitals:   10/27/23 0426 10/27/23 0808  BP: (!) 117/56 (!) 151/55  Pulse: 61 (!) 57  Resp: 16 16  Temp: 98.4 F (36.9 C) 97.6 F (36.4 C)  SpO2: 98% 100%   No new complaints, notes pain up in chair Discussed d/c with brother  General: No acute distress. Cardiovascular: RRR Lungs: unlabored Neurological: Alert and oriented 3.  Moves all extremities 4 with equal strength. Cranial nerves II through XII grossly intact. Skin: Warm and dry. No rashes or lesions. Extremities: dressing to RLE  Discharge Instructions   Discharge Instructions     Call MD for:  difficulty  breathing, headache or visual disturbances   Complete by: As directed    Call MD for:  extreme fatigue   Complete by: As directed    Call MD for:  hives   Complete by: As directed    Call MD for:  persistant dizziness or light-headedness   Complete by: As directed    Call MD for:  persistant nausea and vomiting   Complete by: As directed    Call MD for:  redness, tenderness, or signs of infection (pain, swelling, redness, odor or green/yellow discharge around incision site)   Complete by: As directed    Call MD for:  severe uncontrolled pain   Complete by: As directed    Call MD for:  temperature >100.4   Complete by: As directed    Diet - low sodium heart healthy   Complete by: As directed    Discharge instructions   Complete by: As directed    You were seen for Andrea Kirk right femur fracture.   This was repaired surgically by orthopedics.  You can weight bear as tolerated.  We're discharging you to SNF for rehab.  We'll discharge you with oxycodone  for pain and lovenox  to help prevent blood clots.  You required Andrea Kirk transfusion for post op anemia.  Continue your home iron.  Your b12 levels were low normal.  We've started supplementation for this and should follow these levels outpatient.   We stopped your brilinta .  Continue your aspirin .  We're holding your valsartan on discharge.  Follow your blood pressures with your PCP outpatient to determine if this is Andrea Kirk med you should continue.  Follow your blood sugars with your PCP outpatient on glipizide .  Return for new, recurrent, or worsening symptoms.  Please ask your PCP to request records from this hospitalization so they know what was done and what the next steps will be.   Discharge wound care:   Complete by: As directed    Per orthopedics   Increase activity slowly   Complete by: As directed       Allergies as of 10/27/2023       Reactions   Latex Other (See Comments), Rash, Hives   tears skin   Codeine Other (See Comments)    Abnormal behavior   Morphine  And Codeine Other (See Comments)   Affects BP and blood sugar.   Cyclobenzaprine Other (See Comments)   MADE PT SICK- pt unsure if med was cyclobenzaprine or methocarbamol         Medication List     PAUSE taking these medications    sodium zirconium cyclosilicate  10 g Pack packet Wait to take this until your doctor or other care provider tells you to start again. Follow repeat labs outpatient with your PCP before resuming this medicine Commonly known as: LOKELMA  Take 10 g by mouth daily.   valsartan 80 MG tablet Wait to take this until your doctor or other care provider tells you to start again. Follow repeat labs and your blood pressure outpatient with your provider outpatient before resuming this medicine Commonly known as: DIOVAN Take 80 mg by mouth daily.       STOP taking these medications    HYDROcodone -acetaminophen  5-325 MG tablet Commonly known as: NORCO/VICODIN  ticagrelor  90 MG Tabs tablet Commonly known as: BRILINTA        TAKE these medications    acetaminophen  500 MG tablet Commonly known as: TYLENOL  Take 1,000 mg by mouth at bedtime.   amLODipine  10 MG tablet Commonly known as: NORVASC  Take 10 mg by mouth daily.   aspirin  EC 81 MG tablet Take 81 mg by mouth every evening.   busPIRone  10 MG tablet Commonly known as: BUSPAR  Take 10 mg by mouth 3 (three) times daily.   calcium  carbonate 500 MG chewable tablet Commonly known as: TUMS - dosed in mg elemental calcium  Chew 1 tablet by mouth 3 (three) times daily before meals.   carvedilol  12.5 MG tablet Commonly known as: COREG  Take 1 tablet (12.5 mg total) by mouth 2 (two) times daily.   chlorhexidine  4 % external liquid Commonly known as: HIBICLENS  Apply 15 mLs (1 Application total) topically as directed for 30 doses. Use as directed daily for 5 days every other week for 6 weeks.   citalopram  20 MG tablet Commonly known as: CELEXA  Take 20 mg by mouth  daily.   cyanocobalamin  1000 MCG tablet Take 1 tablet (1,000 mcg total) by mouth daily. Start taking on: October 28, 2023   diphenhydrAMINE -zinc acetate cream Commonly known as: BENADRYL  Apply 1 Application topically daily. Apply to both arms every day shift for itching   docusate sodium  100 MG capsule Commonly known as: COLACE Take 100 mg by mouth 2 (two) times daily.   enoxaparin  40 MG/0.4ML injection Commonly known as: LOVENOX  Inject 0.4 mLs (40 mg total) into the skin daily. Start taking on: October 28, 2023   estradiol 0.1 MG/GM vaginal cream Commonly known as: ESTRACE Place 1 Applicatorful vaginally at bedtime. Twice daily for pain   ferrous sulfate  325 (65 FE) MG tablet Take 325 mg by mouth daily. Monday-Friday   fluticasone 0.005 % ointment Commonly known as: CUTIVATE Apply 1 Application topically in the morning, at noon, and at bedtime. Apply to forearms and legs every shift   furosemide  20 MG tablet Commonly known as: LASIX  Take 1 tablet (20 mg total) by mouth daily as needed for fluid or edema. take once daily as needed for weight gain of 2-3 lbs overnight or 5 lbs in 1 week. What changed:  how much to take how to take this when to take this reasons to take this additional instructions   gabapentin  300 MG capsule Commonly known as: NEURONTIN  Take 300 mg by mouth 3 (three) times daily.   glipiZIDE  5 MG 24 hr tablet Commonly known as: GLUCOTROL  XL Take 1 tablet (5 mg total) by mouth daily with breakfast.   hyoscyamine  0.125 MG SL tablet Commonly known as: LEVSIN  SL Place 1 tablet (0.125 mg total) under the tongue 3 (three) times daily with meals as needed. What changed: when to take this   Jardiance 25 MG Tabs tablet Generic drug: empagliflozin Take 25 mg by mouth daily.   levocetirizine 5 MG tablet Commonly known as: XYZAL Take 5 mg by mouth every evening.   magnesium  oxide 400 (240 Mg) MG tablet Commonly known as: MAG-OX Take 400 mg by mouth  at bedtime.   meclizine  25 MG tablet Commonly known as: ANTIVERT  Take 25 mg by mouth 3 (three) times daily.   melatonin 3 MG Tabs tablet Take 3 mg by mouth at bedtime.   methocarbamol  750 MG tablet Commonly known as: ROBAXIN  Take 750 mg by mouth 2 (two) times daily.   mupirocin  ointment 2 % Commonly  known as: BACTROBAN  Place 1 Application into the nose 2 (two) times daily for 60 doses. Use as directed 2 times daily for 5 days every other week for 6 weeks.   nystatin powder Apply 1 Application topically 2 (two) times daily. Apply under breasts   omeprazole 40 MG capsule Commonly known as: PRILOSEC Take 40 mg by mouth 2 (two) times daily.   oxybutynin  10 MG 24 hr tablet Commonly known as: DITROPAN -XL Take 10 mg by mouth daily.   oxyCODONE  5 MG immediate release tablet Commonly known as: Oxy IR/ROXICODONE  Take 1 tablet (5 mg total) by mouth every 6 (six) hours as needed for up to 3 days.   OXYGEN  Inhale 2 L/min into the lungs continuous.   pantoprazole  40 MG tablet Commonly known as: PROTONIX  Take 40 mg by mouth daily.   REFRESH TEARS OP Place 1 drop into both eyes every 4 (four) hours as needed (for dry eye).   rosuvastatin  10 MG tablet Commonly known as: CRESTOR  Take 10 mg by mouth daily.   saccharomyces boulardii 250 MG capsule Commonly known as: FLORASTOR Take 250 mg by mouth daily.   SALINE NASAL SPRAY NA Place 1 spray into alternate nostrils in the morning and at bedtime.   senna 8.6 MG tablet Commonly known as: SENOKOT Take 2 tablets by mouth at bedtime.               Discharge Care Instructions  (From admission, onward)           Start     Ordered   10/27/23 0000  Discharge wound care:       Comments: Per orthopedics   10/27/23 1301           Allergies  Allergen Reactions   Latex Other (See Comments), Rash and Hives    tears skin   Codeine Other (See Comments)    Abnormal behavior   Morphine  And Codeine Other (See Comments)     Affects BP and blood sugar.   Cyclobenzaprine Other (See Comments)    MADE PT SICK- pt unsure if med was cyclobenzaprine or methocarbamol       The results of significant diagnostics from this hospitalization (including imaging, microbiology, ancillary and laboratory) are listed below for reference.    Significant Diagnostic Studies: DG FEMUR, MIN 2 VIEWS RIGHT Result Date: 10/22/2023 CLINICAL DATA:  Intraoperative fluoroscopic images of proximal right femoral fracture. EXAM: DG FEMUR 2+V*R* COMPARISON:  None Available. FINDINGS: Multiple intraoperative fluoroscopic spot images are provided. Interval open reduction and proximal femoral nail fixation of Andrea Kirk comminuted right intertrochanteric femur fracture with improved alignment. Total fluoroscopy time: 1 minute and 23 seconds Total dose: Radiation Exposure Index (as provided by the fluoroscopic device): 29.16 mGy air Kerma Please see intraoperative findings for further detail. IMPRESSION: Intraoperative fluoroscopic spot images of proximal femoral nail fixation of Andrea Kirk comminuted right intertrochanteric femur fracture with improved alignment. Electronically Signed   By: Harrietta Sherry M.D.   On: 10/22/2023 18:26   DG C-Arm 1-60 Min-No Report Result Date: 10/22/2023 Fluoroscopy was utilized by the requesting physician.  No radiographic interpretation.   ECHOCARDIOGRAM COMPLETE Result Date: 10/22/2023    ECHOCARDIOGRAM REPORT   Patient Name:   Andrea Kirk Date of Exam: 10/22/2023 Medical Rec #:  [id number]     Height:       62.0 in Accession #:    [id number]    Weight:       185.4 lb Date of Birth:  09-Jun-1940  BSA:          1.851 m Patient Age:    83 years      BP:           131/51 mmHg Patient Gender: F             HR:           82 bpm. Exam Location:  Inpatient Procedure: 2D Echo, Color Doppler and Cardiac Doppler (Both Spectral and Color            Flow Doppler were utilized during procedure). Indications:    Pre Op Z01.810  History:         Patient has prior history of Echocardiogram examinations, most                 recent 02/15/2021. CAD, Stroke, Signs/Symptoms:Syncope; Risk                 Factors:Diabetes and Hypertension. H/O Hyperlipidemia.  Sonographer:    BERNARDA ROCKS Referring Phys: Andrea Kirk IMPRESSIONS  1. Left ventricular ejection fraction, by estimation, is >75%. The left ventricle has hyperdynamic function. The left ventricle has no regional wall motion abnormalities. Left ventricular diastolic parameters are consistent with Grade II diastolic dysfunction (pseudonormalization).  2. Right ventricular systolic function is normal. The right ventricular size is normal. There is severely elevated pulmonary artery systolic pressure.  3. Left atrial size was moderately dilated.  4. Right atrial size was mildly dilated.  5. The mitral valve is normal in structure. Trivial mitral valve regurgitation. No evidence of mitral stenosis.  6. The aortic valve is tricuspid. Aortic valve regurgitation is not visualized. Mild aortic valve stenosis.  7. Aortic dilatation noted. There is borderline dilatation of the ascending aorta, measuring 39 mm.  8. The inferior vena cava is normal in size with greater than 50% respiratory variability, suggesting right atrial pressure of 3 mmHg. FINDINGS  Left Ventricle: Left ventricular ejection fraction, by estimation, is >75%. The left ventricle has hyperdynamic function. The left ventricle has no regional wall motion abnormalities. The left ventricular internal cavity size was normal in size. There is no left ventricular hypertrophy. Left ventricular diastolic parameters are consistent with Grade II diastolic dysfunction (pseudonormalization). Right Ventricle: The right ventricular size is normal. Right ventricular systolic function is normal. There is severely elevated pulmonary artery systolic pressure. The tricuspid regurgitant velocity is 4.36 m/s, and with an assumed right atrial pressure  of 3  mmHg, the estimated right ventricular systolic pressure is 79.0 mmHg. Left Atrium: Left atrial size was moderately dilated. Right Atrium: Right atrial size was mildly dilated. Pericardium: There is no evidence of pericardial effusion. Mitral Valve: The mitral valve is normal in structure. Mild mitral annular calcification. Trivial mitral valve regurgitation. No evidence of mitral valve stenosis. MV peak gradient, 4.8 mmHg. The mean mitral valve gradient is 2.0 mmHg. Tricuspid Valve: The tricuspid valve is normal in structure. Tricuspid valve regurgitation is mild . No evidence of tricuspid stenosis. Aortic Valve: The aortic valve is tricuspid. Aortic valve regurgitation is not visualized. Mild aortic stenosis is present. Aortic valve mean gradient measures 9.5 mmHg. Aortic valve peak gradient measures 20.3 mmHg. Aortic valve area, by VTI measures 1.68 cm. Pulmonic Valve: The pulmonic valve was normal in structure. Pulmonic valve regurgitation is not visualized. No evidence of pulmonic stenosis. Aorta: Aortic dilatation noted. There is borderline dilatation of the ascending aorta, measuring 39 mm. Venous: The inferior vena cava is normal in size with greater than  50% respiratory variability, suggesting right atrial pressure of 3 mmHg. IAS/Shunts: No atrial level shunt detected by color flow Doppler.  LEFT VENTRICLE PLAX 2D LVIDd:         4.80 cm      Diastology LVIDs:         3.00 cm      LV e' medial:    11.20 cm/s LV PW:         0.80 cm      LV E/e' medial:  10.5 LV IVS:        0.90 cm      LV e' lateral:   14.90 cm/s LVOT diam:     1.60 cm      LV E/e' lateral: 7.9 LV SV:         73 LV SV Index:   40 LVOT Area:     2.01 cm  LV Volumes (MOD) LV vol d, MOD A2C: 125.0 ml LV vol d, MOD A4C: 121.0 ml LV vol s, MOD A2C: 36.7 ml LV vol s, MOD A4C: 34.2 ml LV SV MOD A2C:     88.3 ml LV SV MOD A4C:     121.0 ml LV SV MOD BP:      88.5 ml RIGHT VENTRICLE             IVC RV Basal diam:  4.40 cm     IVC diam: 1.40 cm RV S  prime:     11.50 cm/s TAPSE (M-mode): 1.6 cm RVSP:           79.0 mmHg LEFT ATRIUM             Index        RIGHT ATRIUM           Index LA diam:        4.40 cm 2.38 cm/m   RA Pressure: 3.00 mmHg LA Vol (A2C):   96.3 ml 52.02 ml/m  RA Area:     18.90 cm LA Vol (A4C):   59.8 ml 32.30 ml/m  RA Volume:   60.60 ml  32.74 ml/m LA Biplane Vol: 76.7 ml 41.43 ml/m  AORTIC VALVE                     PULMONIC VALVE AV Area (Vmax):    1.53 cm      PV Vmax:          1.38 m/s AV Area (Vmean):   1.55 cm      PV Peak grad:     7.6 mmHg AV Area (VTI):     1.68 cm      PR End Diast Vel: 3.96 msec AV Vmax:           225.50 cm/s AV Vmean:          141.000 cm/s AV VTI:            0.436 m AV Peak Grad:      20.3 mmHg AV Mean Grad:      9.5 mmHg LVOT Vmax:         172.00 cm/s LVOT Vmean:        109.000 cm/s LVOT VTI:          0.365 m LVOT/AV VTI ratio: 0.84  AORTA Ao Root diam: 3.00 cm Ao Asc diam:  3.90 cm MITRAL VALVE                TRICUSPID VALVE MV Area (PHT): 3.93  cm     TR Peak grad:   76.0 mmHg MV Area VTI:   2.64 cm     TR Mean grad:   41.0 mmHg MV Peak grad:  4.8 mmHg     TR Vmax:        436.00 cm/s MV Mean grad:  2.0 mmHg     TR Vmean:       300.0 cm/s MV Vmax:       1.09 m/s     Estimated RAP:  3.00 mmHg MV Vmean:      68.7 cm/s    RVSP:           79.0 mmHg MV Decel Time: 193 msec MV E velocity: 118.00 cm/s  SHUNTS MV Yonael Tulloch velocity: 83.20 cm/s   Systemic VTI:  0.36 m MV E/Deneka Greenwalt ratio:  1.42         Systemic Diam: 1.60 cm Andrea Shallow MD Electronically signed by Andrea Shallow MD Signature Date/Time: 10/22/2023/1:22:44 PM    Final    CT CHEST ABDOMEN PELVIS WO CONTRAST Result Date: 10/21/2023 EXAM: CT CHEST, ABDOMEN AND PELVIS WITHOUT CONTRAST 10/21/2023 08:40:45 PM TECHNIQUE: CT of the chest, abdomen and pelvis was performed without the administration of intravenous contrast. Multiplanar reformatted images are provided for review. Automated exposure control, iterative reconstruction, and/or weight based adjustment of  the mA/kV was utilized to reduce the radiation dose to as low as reasonably achievable. COMPARISON: CT abdomen and pelvis 05/28/2022 and CT angiogram chest 12/21/2017. CLINICAL HISTORY: Polytrauma, blunt. FINDINGS: CHEST: MEDIASTINUM AND LYMPH NODES: Heart is mildly enlarged. There are atherosclerotic calcifications of the aorta and coronary arteries. Patient is status post cardiac surgery. Sternotomy wires are present. The central airways are clear. No mediastinal, hilar or axillary lymphadenopathy. There is Samer Dutton moderate-sized hiatal hernia and patulous esophagus. LUNGS AND PLEURA: There is Shloima Clinch small amount of right lower lobe airspace disease. No pleural effusion or pneumothorax. ABDOMEN AND PELVIS: LIVER: The liver is unremarkable. GALLBLADDER AND BILE DUCTS: Gallbladder is unremarkable. No biliary ductal dilatation. SPLEEN: No acute abnormality. PANCREAS: No acute abnormality. ADRENAL GLANDS: No acute abnormality. KIDNEYS, URETERS AND BLADDER: There is Mekel Haverstock 3 cm cyst in the inferior pole of the left kidney, unchanged. Per consensus, no follow-up is needed for simple Bosniak type 1 and 2 renal cysts, unless the patient has [place] Mills malignancy history or risk factors. No stones in the kidneys or ureters. No hydronephrosis. No perinephric or periureteral stranding. Urinary bladder is unremarkable. GI AND BOWEL: Stomach demonstrates no acute abnormality. Appendix is not visualized. There is no bowel obstruction. REPRODUCTIVE ORGANS: No acute abnormality. PERITONEUM AND RETROPERITONEUM: No ascites. No free air. There is Zayin Valadez small fat-containing umbilical hernia. VASCULATURE: Aorta is normal in caliber. There are atherosclerotic calcifications of the aorta and iliac arteries. ABDOMINAL AND PELVIS LYMPH NODES: No lymphadenopathy. BONES AND SOFT TISSUES: There are degenerative changes throughout the spine. L4-L5 lumbar fusion hardware present. There is Yaretzy Olazabal comminuted right femoral intertrochanteric fracture with superolateral  displacement of the distal fracture fragment. No focal soft tissue abnormality. IMPRESSION: 1. Comminuted right intertrochanteric femur fracture with superolateral displacement of the distal fragment. 2. Small right lower lobe airspace disease. No pleural effusion or pneumothorax. 3. Moderate hiatal hernia. Electronically signed by: Andrea Pique MD 10/21/2023 09:00 PM EDT RP Workstation: [id number]   CT HEAD WO CONTRAST Result Date: 10/21/2023 EXAM: CT HEAD AND CERVICAL SPINE 10/21/2023 08:40:45 PM TECHNIQUE: CT of the head and cervical spine was performed without the administration of intravenous contrast. Multiplanar reformatted  images are provided for review. Automated exposure control, iterative reconstruction, and/or weight based adjustment of the mA/kV was utilized to reduce the radiation dose to as low as reasonably achievable. COMPARISON: MRI brain dated 08/28/2022. CLINICAL HISTORY: Head trauma, moderate-severe. FINDINGS: CT HEAD BRAIN AND VENTRICLES: No acute intracranial hemorrhage. No mass effect or midline shift. No abnormal extra-axial fluid collection. No evidence of acute infarct. No hydrocephalus. Subcortical and periventricular small vessel ischemic changes. ORBITS: No acute abnormality. SINUSES AND MASTOIDS: No acute abnormality. SOFT TISSUES AND SKULL: No acute skull fracture. No acute soft tissue abnormality. CT CERVICAL SPINE BONES AND ALIGNMENT: No acute fracture or traumatic malalignment. 4 mm anterolisthesis of C7 on T1. DEGENERATIVE CHANGES: Mild degenerative changes of the mid to lower cervical spine. SOFT TISSUES: No prevertebral soft tissue swelling. IMPRESSION: 1. No acute intracranial abnormality. 2. No acute traumatic injury to the cervical spine. Electronically signed by: Andrea Pebbles MD 10/21/2023 08:58 PM EDT RP Workstation: [id number]   CT CERVICAL SPINE WO CONTRAST Result Date: 10/21/2023 EXAM: CT HEAD AND CERVICAL SPINE 10/21/2023 08:40:45 PM TECHNIQUE: CT of the  head and cervical spine was performed without the administration of intravenous contrast. Multiplanar reformatted images are provided for review. Automated exposure control, iterative reconstruction, and/or weight based adjustment of the mA/kV was utilized to reduce the radiation dose to as low as reasonably achievable. COMPARISON: MRI brain dated 08/28/2022. CLINICAL HISTORY: Head trauma, moderate-severe. FINDINGS: CT HEAD BRAIN AND VENTRICLES: No acute intracranial hemorrhage. No mass effect or midline shift. No abnormal extra-axial fluid collection. No evidence of acute infarct. No hydrocephalus. Subcortical and periventricular small vessel ischemic changes. ORBITS: No acute abnormality. SINUSES AND MASTOIDS: No acute abnormality. SOFT TISSUES AND SKULL: No acute skull fracture. No acute soft tissue abnormality. CT CERVICAL SPINE BONES AND ALIGNMENT: No acute fracture or traumatic malalignment. 4 mm anterolisthesis of C7 on T1. DEGENERATIVE CHANGES: Mild degenerative changes of the mid to lower cervical spine. SOFT TISSUES: No prevertebral soft tissue swelling. IMPRESSION: 1. No acute intracranial abnormality. 2. No acute traumatic injury to the cervical spine. Electronically signed by: Andrea Pebbles MD 10/21/2023 08:58 PM EDT RP Workstation: [id number]   DG Pelvis Portable Result Date: 10/21/2023 EXAM: 1 or 2 VIEW(S) XRAY OF THE PELVIS 10/21/2023 08:18:00 PM COMPARISON: None available. CLINICAL HISTORY: Fall. FINDINGS: BONES AND JOINTS: Acute comminuted and displaced right intertrochanteric fracture. No right hip dislocation. No acute displaced fracture or dislocation of the left hip. No acute displaced fracture or diastasis of the bones of the pelvis. Degenerative changes of the visualized lower lumbar spine. L5-S1 surgical hardware noted. Sacrum is grossly unremarkable - limited evaluation due to overlapping osseous structures and overlying soft tissues. SOFT TISSUES: The soft tissues are unremarkable.  IMPRESSION: 1. Acute comminuted and displaced right intertrochanteric fracture. Electronically signed by: Morgane Naveau MD 10/21/2023 08:31 PM EDT RP Workstation: [id number]   DG Chest Port 1 View Result Date: 10/21/2023 EXAM: 1 VIEW XRAY OF THE CHEST 10/21/2023 08:18:00 PM COMPARISON: CT chest dated 12/21/2017. CLINICAL HISTORY: Trauma. fall. FINDINGS: LUNGS AND PLEURA: Chronic coarsened interstitial markings with no overt pulmonary edema. No focal consolidation. No pleural effusion. No pneumothorax. HEART AND MEDIASTINUM: Unchanged cardiomediastinal silhouette. Atherosclerotic plaque. Surgical changes overlies the mediastinum. BONES AND SOFT TISSUES: Intact sternotomy wires. No acute osseous abnormality. IMPRESSION: 1. No acute cardiopulmonary process identified. Electronically signed by: Morgane Naveau MD 10/21/2023 08:29 PM EDT RP Workstation: [id number]    Microbiology: Recent Results (from the past 240 hours)  Surgical PCR screen  Status: Abnormal   Collection Time: 10/22/23  3:54 AM   Specimen: Nasal Mucosa; Nasal Swab  Result Value Ref Range Status   MRSA, PCR NEGATIVE NEGATIVE Final   Staphylococcus aureus POSITIVE (Mairin Lindsley) NEGATIVE Final    Comment: (NOTE) The Xpert SA Assay (FDA approved for NASAL specimens in patients 29 years of age and older), is one component of Shaundra Fullam comprehensive surveillance program. It is not intended to diagnose infection nor to guide or monitor treatment. Performed at [facility], [address] [place], [place] [address]      Labs: Basic Metabolic Panel: Recent Labs  Lab 10/21/23 2024 10/22/23 9557 10/23/23 0431 10/24/23 0433 10/25/23 0618 10/26/23 0747 10/27/23 0722  NA 134*   < > 138 137 136 140 135  K 4.4   < > 4.2 4.3 4.5 4.7 4.8  CL 92*   < > 97* 96* 98 99 98  CO2 32   < > 31 31 25  32 29  GLUCOSE 339*   < > 248* 162* 204* 193* 187*  BUN 49*   < > 33* 34* 26* 23 27*  CREATININE 1.29*   < > 1.09* 0.97 0.80 0.85 0.84  CALCIUM  8.9    < > 9.1 9.1 9.1 9.4 9.0  MG 2.2  --   --   --  2.1 2.1 1.9  PHOS 4.1  --   --   --  3.0 3.5 3.5   < > = values in this interval not displayed.   Liver Function Tests: Recent Labs  Lab 10/21/23 2024 10/22/23 0442 10/25/23 0618 10/26/23 0747 10/27/23 0722  AST 63* 138* 34 24 42*  ALT 52* 86* 14 13 24   ALKPHOS 115 107 69 70 85  BILITOT 0.8 0.8 0.9 0.8 1.0  PROT 7.1 6.8 6.2* 6.2* 6.2*  ALBUMIN 3.4* 3.5 2.7* 2.6* 2.5*   No results for input(s): LIPASE, AMYLASE in the last 168 hours. No results for input(s): AMMONIA in the last 168 hours. CBC: Recent Labs  Lab 10/23/23 0431 10/24/23 0433 10/24/23 1800 10/25/23 0618 10/26/23 0747 10/27/23 0722  WBC 9.2 8.6  --  6.8 5.9 5.7  NEUTROABS  --   --   --  3.9 3.5 3.1  HGB 7.0* 7.4* 9.0* 9.0* 9.0* 8.4*  HCT 21.4* 22.5* 27.6* 26.8* 27.6* 26.0*  MCV 106.5* 103.7*  --  99.6 103.0* 102.8*  PLT 149* 123*  --  124* 142* 173   Cardiac Enzymes: Recent Labs  Lab 10/21/23 2024  CKTOTAL 59   BNP: BNP (last 3 results) No results for input(s): BNP in the last 8760 hours.  ProBNP (last 3 results) No results for input(s): PROBNP in the last 8760 hours.  CBG: Recent Labs  Lab 10/26/23 1501 10/26/23 2109 10/27/23 0543 10/27/23 0639 10/27/23 1252  GLUCAP 219* 205* 180* 178* 277*       Signed:  Meliton Monte MD.  [facility] 10/27/2023, 1:01 PM

## 2023-10-27 NOTE — Progress Notes (Signed)
 Discharge instructions copy (including medications) provided in discharge packet to receiving facility.

## 2023-10-27 NOTE — Progress Notes (Signed)
 Attempted to call report to [facility], no answer. Voicemail left with callback number.

## 2023-10-27 NOTE — TOC Transition Note (Signed)
 Transition of Care [facility]) - Discharge Note   Patient Details  Name: Andrea Kirk MRN: [id number] Date of Birth: 1940-05-28  [facility] CM/SW Contact:  Bridget Cordella Simmonds, LCSW Phone Number: 10/27/2023, 2:03 PM   Clinical Narrative:   Pt discharging to [facility], room216.  RN call report to Vernell, [phone].  PTAR called [phone].     Final next level of care: Skilled Nursing Facility Barriers to Discharge: Barriers Resolved   Patient Goals and CMS Choice Patient states their goals for this hospitalization and ongoing recovery are:: walking   Choice offered to / list presented to : Patient (pt confirms plan to return to [facility])      Discharge Placement              Patient chooses bed at: [facility] Patient to be transferred to facility by: ptar Name of family member notified: brother [name] Patient and family notified of of transfer: 10/27/23  Discharge Plan and Services Additional resources added to the After Visit Summary for   In-house Referral: Clinical Social Work   Post Acute Care Choice: Skilled Nursing Facility                               Social Drivers of Health (SDOH) Interventions SDOH Screenings   Food Insecurity: No Food Insecurity (10/22/2023)  Housing: Low Risk  (10/22/2023)  Transportation Needs: No Transportation Needs (10/22/2023)  Utilities: Not At Risk (10/22/2023)  Social Connections: Moderately Integrated (10/22/2023)  Tobacco Use: Low Risk  (10/22/2023)     Readmission Risk Interventions     No data to display

## 2023-10-27 NOTE — Progress Notes (Signed)
 Physical Therapy Treatment Patient Details Name: Andrea Kirk Patman MRN: [id number] DOB: 1940-10-13 Today's Date: 10/27/2023   History of Present Illness Pt is a 83 y.o. female admitted 10/21/23 after mechanical fall at her nursing facility landing on her right side and hitting her head. X-ray of right hip show a displaced intertrochanteric hip fracture. CT head with no acute intracranial abnormality or acute traumatic injury to the cervical spine. Pt s/p IM rod 10/15. PMHx: HTN, HLD, anxiety, CAD, s/p left CAE in 2015, right CAE in 2018, s/p CABG in 1997, anemia, T2DM, GERD, RLS, and CVA.    PT Comments  Pt remains limited by pain but is able to progress to out of bed transfers this session. Pt demonstrates poor tolerance for weight shift to RLE when attempting step-pivot transfer, PT thus assists in stand pivot transfer with facilitation at trunk/hips. Pt remains at a high risk for falls due to pain and RLE weakness. Patient will benefit from continued inpatient follow up therapy, <3 hours/day.    If plan is discharge home, recommend the following: Two people to help with walking and/or transfers;Two people to help with bathing/dressing/bathroom;Assistance with cooking/housework;Assist for transportation;Help with stairs or ramp for entrance   Can travel by private vehicle     No  Equipment Recommendations  Hospital bed;Hoyer lift    Recommendations for Other Services       Precautions / Restrictions Precautions Precautions: Fall Recall of Precautions/Restrictions: Intact Restrictions Weight Bearing Restrictions Per Provider Order: Yes RLE Weight Bearing Per Provider Order: Weight bearing as tolerated     Mobility  Bed Mobility Overal bed mobility: Needs Assistance Bed Mobility: Supine to Sit     Supine to sit: Mod assist, HOB elevated, Used rails          Transfers Overall transfer level: Needs assistance Equipment used: Rolling walker (2 wheels) Transfers: Sit to/from  Stand, Bed to chair/wheelchair/BSC Sit to Stand: Mod assist, From elevated surface Stand pivot transfers: Max assist         General transfer comment: pt unable to step with LLE due to poor tolerance for weight shift to right leg, PT assists with pivot transfer at hips/trunk as well as with verbal cues for hand placement    Ambulation/Gait                   Stairs             Wheelchair Mobility     Tilt Bed    Modified Rankin (Stroke Patients Only)       Balance Overall balance assessment: Needs assistance Sitting-balance support: Single extremity supported, Bilateral upper extremity supported, Feet supported Sitting balance-Leahy Scale: Poor     Standing balance support: Bilateral upper extremity supported, Reliant on assistive device for balance Standing balance-Leahy Scale: Poor                              Communication Communication Communication: No apparent difficulties  Cognition Arousal: Alert Behavior During Therapy: WFL for tasks assessed/performed   PT - Cognitive impairments: No family/caregiver present to determine baseline, Attention, Initiation, Sequencing, Safety/Judgement                         Following commands: Impaired Following commands impaired: Follows one step commands with increased time    Cueing Cueing Techniques: Verbal cues, Tactile cues  Exercises      General Comments General  comments (skin integrity, edema, etc.): VSS on RA      Pertinent Vitals/Pain Pain Assessment Pain Assessment: 0-10 Pain Score: 7  Pain Location: R hip Pain Descriptors / Indicators: Sore Pain Intervention(s): Patient requesting pain meds-RN notified    Home Living                          Prior Function            PT Goals (current goals can now be found in the care plan section) Acute Rehab PT Goals Patient Stated Goal: Have less pain Progress towards PT goals: Progressing toward goals     Frequency    Min 1X/week      PT Plan      Co-evaluation              AM-PAC PT 6 Clicks Mobility   Outcome Measure  Help needed turning from your back to your side while in a flat bed without using bedrails?: A Lot Help needed moving from lying on your back to sitting on the side of a flat bed without using bedrails?: A Lot Help needed moving to and from a bed to a chair (including a wheelchair)?: A Lot Help needed standing up from a chair using your arms (e.g., wheelchair or bedside chair)?: A Lot Help needed to walk in hospital room?: Total Help needed climbing 3-5 steps with a railing? : Total 6 Click Score: 10    End of Session Equipment Utilized During Treatment: Gait belt;Oxygen  Activity Tolerance: Patient limited by pain Patient left: in chair;with call bell/phone within reach;with chair alarm set Nurse Communication: Mobility status PT Visit Diagnosis: Muscle weakness (generalized) (M62.81);Pain;Other abnormalities of gait and mobility (R26.89);Unsteadiness on feet (R26.81);Difficulty in walking, not elsewhere classified (R26.2) Pain - Right/Left: Right Pain - part of body: Hip     Time: 1022-1048 PT Time Calculation (min) (ACUTE ONLY): 26 min  Charges:    $Therapeutic Activity: 23-37 mins PT General Charges $$ ACUTE PT VISIT: 1 Visit                     Bernardino JINNY Ruth, PT, DPT Acute Rehabilitation Office [phone]    Bernardino JINNY Ruth 10/27/2023, 10:55 AM

## 2023-10-28 DIAGNOSIS — M6281 Muscle weakness (generalized): Secondary | ICD-10-CM | POA: Diagnosis not present

## 2023-10-28 DIAGNOSIS — L84 Corns and callosities: Secondary | ICD-10-CM | POA: Diagnosis not present

## 2023-10-28 DIAGNOSIS — L120 Bullous pemphigoid: Secondary | ICD-10-CM | POA: Diagnosis not present

## 2023-10-28 DIAGNOSIS — B379 Candidiasis, unspecified: Secondary | ICD-10-CM | POA: Diagnosis not present

## 2023-11-03 ENCOUNTER — Emergency Department (HOSPITAL_COMMUNITY)

## 2023-11-03 ENCOUNTER — Encounter (HOSPITAL_COMMUNITY): Payer: Self-pay | Admitting: Internal Medicine

## 2023-11-03 ENCOUNTER — Observation Stay (HOSPITAL_COMMUNITY)

## 2023-11-03 ENCOUNTER — Inpatient Hospital Stay (HOSPITAL_COMMUNITY)
Admission: EM | Admit: 2023-11-03 | Discharge: 2023-11-07 | DRG: 040 | Disposition: A | Discharge status: Skilled Nursing Facility | Source: Skilled Nursing Facility | Attending: Internal Medicine | Admitting: Internal Medicine

## 2023-11-03 ENCOUNTER — Other Ambulatory Visit: Payer: Self-pay

## 2023-11-03 DIAGNOSIS — R29708 NIHSS score 8: Secondary | ICD-10-CM | POA: Diagnosis not present

## 2023-11-03 DIAGNOSIS — R404 Transient alteration of awareness: Secondary | ICD-10-CM | POA: Diagnosis not present

## 2023-11-03 DIAGNOSIS — I63449 Cerebral infarction due to embolism of unspecified cerebellar artery: Principal | ICD-10-CM | POA: Diagnosis present

## 2023-11-03 DIAGNOSIS — W1830XA Fall on same level, unspecified, initial encounter: Secondary | ICD-10-CM | POA: Diagnosis present

## 2023-11-03 DIAGNOSIS — N179 Acute kidney failure, unspecified: Secondary | ICD-10-CM | POA: Diagnosis present

## 2023-11-03 DIAGNOSIS — Z83438 Family history of other disorder of lipoprotein metabolism and other lipidemia: Secondary | ICD-10-CM

## 2023-11-03 DIAGNOSIS — I6523 Occlusion and stenosis of bilateral carotid arteries: Secondary | ICD-10-CM | POA: Diagnosis not present

## 2023-11-03 DIAGNOSIS — Z91040 Latex allergy status: Secondary | ICD-10-CM

## 2023-11-03 DIAGNOSIS — G9340 Encephalopathy, unspecified: Secondary | ICD-10-CM | POA: Diagnosis not present

## 2023-11-03 DIAGNOSIS — N19 Unspecified kidney failure: Secondary | ICD-10-CM | POA: Insufficient documentation

## 2023-11-03 DIAGNOSIS — Z7984 Long term (current) use of oral hypoglycemic drugs: Secondary | ICD-10-CM

## 2023-11-03 DIAGNOSIS — R748 Abnormal levels of other serum enzymes: Secondary | ICD-10-CM | POA: Diagnosis not present

## 2023-11-03 DIAGNOSIS — Z951 Presence of aortocoronary bypass graft: Secondary | ICD-10-CM

## 2023-11-03 DIAGNOSIS — N182 Chronic kidney disease, stage 2 (mild): Secondary | ICD-10-CM | POA: Diagnosis present

## 2023-11-03 DIAGNOSIS — I2510 Atherosclerotic heart disease of native coronary artery without angina pectoris: Secondary | ICD-10-CM | POA: Diagnosis not present

## 2023-11-03 DIAGNOSIS — I10 Essential (primary) hypertension: Secondary | ICD-10-CM | POA: Diagnosis present

## 2023-11-03 DIAGNOSIS — G8191 Hemiplegia, unspecified affecting right dominant side: Secondary | ICD-10-CM | POA: Diagnosis present

## 2023-11-03 DIAGNOSIS — R778 Other specified abnormalities of plasma proteins: Secondary | ICD-10-CM | POA: Diagnosis not present

## 2023-11-03 DIAGNOSIS — E872 Acidosis, unspecified: Secondary | ICD-10-CM | POA: Diagnosis present

## 2023-11-03 DIAGNOSIS — N390 Urinary tract infection, site not specified: Secondary | ICD-10-CM | POA: Diagnosis present

## 2023-11-03 DIAGNOSIS — I6340 Cerebral infarction due to embolism of unspecified cerebral artery: Secondary | ICD-10-CM | POA: Diagnosis not present

## 2023-11-03 DIAGNOSIS — D631 Anemia in chronic kidney disease: Secondary | ICD-10-CM | POA: Diagnosis present

## 2023-11-03 DIAGNOSIS — E785 Hyperlipidemia, unspecified: Secondary | ICD-10-CM | POA: Diagnosis present

## 2023-11-03 DIAGNOSIS — E860 Dehydration: Secondary | ICD-10-CM | POA: Diagnosis present

## 2023-11-03 DIAGNOSIS — J9811 Atelectasis: Secondary | ICD-10-CM | POA: Diagnosis present

## 2023-11-03 DIAGNOSIS — Z947 Corneal transplant status: Secondary | ICD-10-CM

## 2023-11-03 DIAGNOSIS — K7200 Acute and subacute hepatic failure without coma: Secondary | ICD-10-CM | POA: Diagnosis present

## 2023-11-03 DIAGNOSIS — I6529 Occlusion and stenosis of unspecified carotid artery: Secondary | ICD-10-CM

## 2023-11-03 DIAGNOSIS — R471 Dysarthria and anarthria: Secondary | ICD-10-CM | POA: Diagnosis present

## 2023-11-03 DIAGNOSIS — Z8249 Family history of ischemic heart disease and other diseases of the circulatory system: Secondary | ICD-10-CM

## 2023-11-03 DIAGNOSIS — E782 Mixed hyperlipidemia: Secondary | ICD-10-CM | POA: Diagnosis present

## 2023-11-03 DIAGNOSIS — R29712 NIHSS score 12: Secondary | ICD-10-CM | POA: Diagnosis present

## 2023-11-03 DIAGNOSIS — K5900 Constipation, unspecified: Secondary | ICD-10-CM | POA: Diagnosis present

## 2023-11-03 DIAGNOSIS — G8929 Other chronic pain: Secondary | ICD-10-CM | POA: Diagnosis present

## 2023-11-03 DIAGNOSIS — Z1612 Extended spectrum beta lactamase (ESBL) resistance: Secondary | ICD-10-CM | POA: Diagnosis present

## 2023-11-03 DIAGNOSIS — E1165 Type 2 diabetes mellitus with hyperglycemia: Secondary | ICD-10-CM | POA: Diagnosis present

## 2023-11-03 DIAGNOSIS — G2581 Restless legs syndrome: Secondary | ICD-10-CM | POA: Diagnosis present

## 2023-11-03 DIAGNOSIS — I214 Non-ST elevation (NSTEMI) myocardial infarction: Secondary | ICD-10-CM | POA: Insufficient documentation

## 2023-11-03 DIAGNOSIS — I779 Disorder of arteries and arterioles, unspecified: Secondary | ICD-10-CM | POA: Diagnosis present

## 2023-11-03 DIAGNOSIS — R918 Other nonspecific abnormal finding of lung field: Secondary | ICD-10-CM | POA: Diagnosis not present

## 2023-11-03 DIAGNOSIS — R0989 Other specified symptoms and signs involving the circulatory and respiratory systems: Secondary | ICD-10-CM | POA: Diagnosis not present

## 2023-11-03 DIAGNOSIS — R4701 Aphasia: Secondary | ICD-10-CM | POA: Diagnosis present

## 2023-11-03 DIAGNOSIS — G9341 Metabolic encephalopathy: Secondary | ICD-10-CM | POA: Diagnosis present

## 2023-11-03 DIAGNOSIS — E875 Hyperkalemia: Secondary | ICD-10-CM | POA: Diagnosis present

## 2023-11-03 DIAGNOSIS — S72141A Displaced intertrochanteric fracture of right femur, initial encounter for closed fracture: Secondary | ICD-10-CM | POA: Diagnosis present

## 2023-11-03 DIAGNOSIS — G936 Cerebral edema: Secondary | ICD-10-CM | POA: Diagnosis not present

## 2023-11-03 DIAGNOSIS — F32A Depression, unspecified: Secondary | ICD-10-CM | POA: Diagnosis present

## 2023-11-03 DIAGNOSIS — I639 Cerebral infarction, unspecified: Secondary | ICD-10-CM | POA: Diagnosis not present

## 2023-11-03 DIAGNOSIS — K7460 Unspecified cirrhosis of liver: Secondary | ICD-10-CM | POA: Diagnosis present

## 2023-11-03 DIAGNOSIS — Z79899 Other long term (current) drug therapy: Secondary | ICD-10-CM

## 2023-11-03 DIAGNOSIS — Z79890 Hormone replacement therapy (postmenopausal): Secondary | ICD-10-CM

## 2023-11-03 DIAGNOSIS — Z7982 Long term (current) use of aspirin: Secondary | ICD-10-CM

## 2023-11-03 DIAGNOSIS — I130 Hypertensive heart and chronic kidney disease with heart failure and stage 1 through stage 4 chronic kidney disease, or unspecified chronic kidney disease: Secondary | ICD-10-CM | POA: Diagnosis present

## 2023-11-03 DIAGNOSIS — Z885 Allergy status to narcotic agent status: Secondary | ICD-10-CM

## 2023-11-03 DIAGNOSIS — R1310 Dysphagia, unspecified: Secondary | ICD-10-CM | POA: Diagnosis present

## 2023-11-03 DIAGNOSIS — F419 Anxiety disorder, unspecified: Secondary | ICD-10-CM | POA: Diagnosis present

## 2023-11-03 DIAGNOSIS — R7989 Other specified abnormal findings of blood chemistry: Secondary | ICD-10-CM

## 2023-11-03 DIAGNOSIS — Z713 Dietary counseling and surveillance: Secondary | ICD-10-CM

## 2023-11-03 DIAGNOSIS — J90 Pleural effusion, not elsewhere classified: Secondary | ICD-10-CM | POA: Diagnosis not present

## 2023-11-03 DIAGNOSIS — E669 Obesity, unspecified: Secondary | ICD-10-CM | POA: Diagnosis present

## 2023-11-03 DIAGNOSIS — Z888 Allergy status to other drugs, medicaments and biological substances status: Secondary | ICD-10-CM

## 2023-11-03 DIAGNOSIS — K219 Gastro-esophageal reflux disease without esophagitis: Secondary | ICD-10-CM | POA: Diagnosis present

## 2023-11-03 DIAGNOSIS — I272 Pulmonary hypertension, unspecified: Secondary | ICD-10-CM | POA: Diagnosis present

## 2023-11-03 DIAGNOSIS — I5033 Acute on chronic diastolic (congestive) heart failure: Secondary | ICD-10-CM | POA: Diagnosis present

## 2023-11-03 DIAGNOSIS — Z8673 Personal history of transient ischemic attack (TIA), and cerebral infarction without residual deficits: Secondary | ICD-10-CM

## 2023-11-03 DIAGNOSIS — J302 Other seasonal allergic rhinitis: Secondary | ICD-10-CM | POA: Diagnosis present

## 2023-11-03 DIAGNOSIS — E8729 Other acidosis: Secondary | ICD-10-CM | POA: Insufficient documentation

## 2023-11-03 DIAGNOSIS — Z6830 Body mass index (BMI) 30.0-30.9, adult: Secondary | ICD-10-CM

## 2023-11-03 DIAGNOSIS — J9611 Chronic respiratory failure with hypoxia: Secondary | ICD-10-CM | POA: Diagnosis present

## 2023-11-03 DIAGNOSIS — E8809 Other disorders of plasma-protein metabolism, not elsewhere classified: Secondary | ICD-10-CM | POA: Diagnosis present

## 2023-11-03 DIAGNOSIS — I9589 Other hypotension: Secondary | ICD-10-CM | POA: Diagnosis present

## 2023-11-03 DIAGNOSIS — E1122 Type 2 diabetes mellitus with diabetic chronic kidney disease: Secondary | ICD-10-CM | POA: Diagnosis present

## 2023-11-03 DIAGNOSIS — I517 Cardiomegaly: Secondary | ICD-10-CM | POA: Diagnosis not present

## 2023-11-03 DIAGNOSIS — Z9981 Dependence on supplemental oxygen: Secondary | ICD-10-CM

## 2023-11-03 DIAGNOSIS — Z1152 Encounter for screening for COVID-19: Secondary | ICD-10-CM

## 2023-11-03 DIAGNOSIS — E1151 Type 2 diabetes mellitus with diabetic peripheral angiopathy without gangrene: Secondary | ICD-10-CM | POA: Diagnosis present

## 2023-11-03 DIAGNOSIS — I21A1 Myocardial infarction type 2: Secondary | ICD-10-CM | POA: Diagnosis present

## 2023-11-03 HISTORY — DX: Cerebral infarction, unspecified: I63.9

## 2023-11-03 LAB — URINALYSIS, W/ REFLEX TO CULTURE (INFECTION SUSPECTED)
Bilirubin Urine: NEGATIVE
Glucose, UA: >=500 mg/dL — AB
Ketones, ur: 5 mg/dL — AB
Nitrite: NEGATIVE
Protein, ur: 100 mg/dL — AB
Specific Gravity, Urine: 1.016 (ref 1.005–1.030)
WBC, UA: >50 WBC/hpf (ref 0–5)
pH: 5 (ref 5.0–8.0)

## 2023-11-03 LAB — MAGNESIUM: Magnesium: 3 mg/dL — ABNORMAL HIGH (ref 1.7–2.4)

## 2023-11-03 LAB — COMPREHENSIVE METABOLIC PANEL WITH GFR
ALT: 437 U/L — ABNORMAL HIGH (ref 0–44)
AST: 1001 U/L — ABNORMAL HIGH (ref 15–41)
Albumin: 2.9 g/dL — ABNORMAL LOW (ref 3.5–5.0)
Alkaline Phosphatase: 98 U/L (ref 38–126)
Anion gap: 17 — ABNORMAL HIGH (ref 5–15)
BUN: 74 mg/dL — ABNORMAL HIGH (ref 8–23)
CO2: 21 mmol/L — ABNORMAL LOW (ref 22–32)
Calcium: 8.9 mg/dL (ref 8.9–10.3)
Chloride: 103 mmol/L (ref 98–111)
Creatinine, Ser: 2.37 mg/dL — ABNORMAL HIGH (ref 0.44–1.00)
GFR, Estimated: 20 mL/min — ABNORMAL LOW (ref >60)
Glucose, Bld: 143 mg/dL — ABNORMAL HIGH (ref 70–99)
Potassium: 4.4 mmol/L (ref 3.5–5.1)
Sodium: 141 mmol/L (ref 135–145)
Total Bilirubin: 1.6 mg/dL — ABNORMAL HIGH (ref 0.0–1.2)
Total Protein: 7.2 g/dL (ref 6.5–8.1)

## 2023-11-03 LAB — CBC WITH DIFFERENTIAL/PLATELET
Abs Immature Granulocytes: 0.07 K/uL (ref 0.00–0.07)
Basophils Absolute: 0.1 K/uL (ref 0.0–0.1)
Basophils Relative: 1 %
Eosinophils Absolute: 0.1 K/uL (ref 0.0–0.5)
Eosinophils Relative: 2 %
HCT: 29.1 % — ABNORMAL LOW (ref 36.0–46.0)
Hemoglobin: 9.1 g/dL — ABNORMAL LOW (ref 12.0–15.0)
Immature Granulocytes: 1 %
Lymphocytes Relative: 13 %
Lymphs Abs: 1.2 K/uL (ref 0.7–4.0)
MCH: 34.2 pg — ABNORMAL HIGH (ref 26.0–34.0)
MCHC: 31.3 g/dL (ref 30.0–36.0)
MCV: 109.4 fL — ABNORMAL HIGH (ref 80.0–100.0)
Monocytes Absolute: 0.5 K/uL (ref 0.1–1.0)
Monocytes Relative: 5 %
Neutro Abs: 7.2 K/uL (ref 1.7–7.7)
Neutrophils Relative %: 78 %
Platelets: 282 K/uL (ref 150–400)
RBC: 2.66 MIL/uL — ABNORMAL LOW (ref 3.87–5.11)
RDW: 16.4 % — ABNORMAL HIGH (ref 11.5–15.5)
WBC: 9.1 K/uL (ref 4.0–10.5)
nRBC: 0.2 % (ref 0.0–0.2)

## 2023-11-03 LAB — I-STAT VENOUS BLOOD GAS, ED
Acid-base deficit: 1 mmol/L (ref 0.0–2.0)
Bicarbonate: 24.5 mmol/L (ref 20.0–28.0)
Calcium, Ion: 1.15 mmol/L (ref 1.15–1.40)
HCT: 27 % — ABNORMAL LOW (ref 36.0–46.0)
Hemoglobin: 9.2 g/dL — ABNORMAL LOW (ref 12.0–15.0)
O2 Saturation: 82 %
Potassium: 4.4 mmol/L (ref 3.5–5.1)
Sodium: 140 mmol/L (ref 135–145)
TCO2: 26 mmol/L (ref 22–32)
pCO2, Ven: 44.4 mmHg (ref 44–60)
pH, Ven: 7.349 (ref 7.25–7.43)
pO2, Ven: 49 mmHg — ABNORMAL HIGH (ref 32–45)

## 2023-11-03 LAB — BRAIN NATRIURETIC PEPTIDE: B Natriuretic Peptide: 673.1 pg/mL — ABNORMAL HIGH (ref 0.0–100.0)

## 2023-11-03 LAB — RESP PANEL BY RT-PCR (RSV, FLU A&B, COVID)  RVPGX2
Influenza A by PCR: NEGATIVE
Influenza B by PCR: NEGATIVE
Resp Syncytial Virus by PCR: NEGATIVE
SARS Coronavirus 2 by RT PCR: NEGATIVE

## 2023-11-03 LAB — I-STAT CHEM 8, ED
BUN: 72 mg/dL — ABNORMAL HIGH (ref 8–23)
Calcium, Ion: 1.16 mmol/L (ref 1.15–1.40)
Chloride: 105 mmol/L (ref 98–111)
Creatinine, Ser: 2.4 mg/dL — ABNORMAL HIGH (ref 0.44–1.00)
Glucose, Bld: 141 mg/dL — ABNORMAL HIGH (ref 70–99)
HCT: 28 % — ABNORMAL LOW (ref 36.0–46.0)
Hemoglobin: 9.5 g/dL — ABNORMAL LOW (ref 12.0–15.0)
Potassium: 4.4 mmol/L (ref 3.5–5.1)
Sodium: 140 mmol/L (ref 135–145)
TCO2: 24 mmol/L (ref 22–32)

## 2023-11-03 LAB — TROPONIN I (HIGH SENSITIVITY)
Troponin I (High Sensitivity): 779 ng/L (ref <18)
Troponin I (High Sensitivity): 841 ng/L (ref <18)
Troponin I (High Sensitivity): 713 ng/L (ref <18)

## 2023-11-03 LAB — I-STAT CG4 LACTIC ACID, ED: Lactic Acid, Venous: 1.2 mmol/L (ref 0.5–1.9)

## 2023-11-03 LAB — CBG MONITORING, ED
Glucose-Capillary: 136 mg/dL — ABNORMAL HIGH (ref 70–99)
Glucose-Capillary: 129 mg/dL — ABNORMAL HIGH (ref 70–99)

## 2023-11-03 MED ORDER — ACETAMINOPHEN 650 MG RE SUPP: 650.0000 mg | RECTAL | Status: DC | PRN

## 2023-11-03 MED ORDER — ACETAMINOPHEN 160 MG/5ML PO SOLN: 650.0000 mg | ORAL | Status: DC | PRN

## 2023-11-03 MED ORDER — SODIUM CHLORIDE 0.9 % IV SOLN: INTRAVENOUS | Status: DC

## 2023-11-03 MED ORDER — ASPIRIN 81 MG PO TBEC: 81.0000 mg | DELAYED_RELEASE_TABLET | Freq: Every evening | ORAL | Status: DC

## 2023-11-03 MED ORDER — GABAPENTIN 300 MG PO CAPS
300.0000 mg | ORAL_CAPSULE | Freq: Three times a day (TID) | ORAL | Status: DC
  Administered 2023-11-03 – 2023-11-04 (×2): 300 mg via ORAL
  Filled 2023-11-03 (×2): qty 1

## 2023-11-03 MED ORDER — PANTOPRAZOLE SODIUM 40 MG PO TBEC
40.0000 mg | DELAYED_RELEASE_TABLET | Freq: Every day | ORAL | Status: DC
  Administered 2023-11-03 – 2023-11-05 (×3): 40 mg via ORAL
  Filled 2023-11-03 (×3): qty 1

## 2023-11-03 MED ORDER — SENNOSIDES-DOCUSATE SODIUM 8.6-50 MG PO TABS
1.0000 | ORAL_TABLET | Freq: Every evening | ORAL | Status: DC | PRN
  Administered 2023-11-04: 1 via ORAL
  Filled 2023-11-03: qty 1

## 2023-11-03 MED ORDER — HEPARIN (PORCINE) 25000 UT/250ML-% IV SOLN
1000.0000 [IU]/h | INTRAVENOUS | Status: DC
  Administered 2023-11-03: 850 [IU]/h via INTRAVENOUS
  Filled 2023-11-03: qty 250

## 2023-11-03 MED ORDER — OXYBUTYNIN CHLORIDE ER 10 MG PO TB24
10.0000 mg | ORAL_TABLET | Freq: Every day | ORAL | Status: DC
  Administered 2023-11-04 – 2023-11-07 (×4): 10 mg via ORAL
  Filled 2023-11-03 (×4): qty 1

## 2023-11-03 MED ORDER — ROSUVASTATIN CALCIUM 5 MG PO TABS: 10.0000 mg | ORAL_TABLET | Freq: Every day | ORAL | Status: DC

## 2023-11-03 MED ORDER — SODIUM CHLORIDE 0.9 % IV SOLN
2.0000 g | Freq: Once | INTRAVENOUS | Status: AC
  Administered 2023-11-03: 2 g via INTRAVENOUS
  Filled 2023-11-03: qty 12.5

## 2023-11-03 MED ORDER — LACTATED RINGERS IV BOLUS (SEPSIS)
1000.0000 mL | Freq: Once | INTRAVENOUS | Status: AC
Start: 2023-11-03 — End: 2023-11-03
  Administered 2023-11-03: 1000 mL via INTRAVENOUS

## 2023-11-03 MED ORDER — ACETAMINOPHEN 325 MG PO TABS
650.0000 mg | ORAL_TABLET | ORAL | Status: DC | PRN
  Administered 2023-11-04 (×2): 650 mg via ORAL
  Filled 2023-11-03 (×2): qty 2

## 2023-11-03 MED ORDER — STROKE: EARLY STAGES OF RECOVERY BOOK
Freq: Once | Status: AC
  Filled 2023-11-03: qty 1

## 2023-11-03 MED ORDER — INSULIN ASPART 100 UNIT/ML IJ SOLN
0.0000 [IU] | Freq: Three times a day (TID) | INTRAMUSCULAR | Status: DC
  Administered 2023-11-03 – 2023-11-05 (×2): 2 [IU] via SUBCUTANEOUS
  Administered 2023-11-06 – 2023-11-07 (×3): 3 [IU] via SUBCUTANEOUS
  Administered 2023-11-07: 2 [IU] via SUBCUTANEOUS

## 2023-11-03 MED ORDER — SODIUM CHLORIDE 0.9 % IV SOLN
2.0000 g | INTRAVENOUS | Status: DC
  Administered 2023-11-03 – 2023-11-05 (×3): 2 g via INTRAVENOUS
  Filled 2023-11-03 (×3): qty 20

## 2023-11-03 NOTE — ED Notes (Signed)
 [facility] arriving with patient from [facility] facility. The facility states she is normally AxOx4, walking around without issue. The pt is noticeably confused and saying words that don't make sense, calling herself a baby, etc. The patient is also thought to be hyperkalemic based on recent labs by the facility.    EMS Vitals and Interventions: CBG 164 O2 98 on 2L BP 124/41 RR 16 Capno 31

## 2023-11-03 NOTE — H&P (Signed)
 History and Physical   Andrea Kirk FMW:[id number] DOB: 07/12/1940 DOA: 11/03/2023  PCP: System, Provider Not In   Patient coming from: SNF  Chief Complaint: Altered mental status  HPI: Andrea Kirk is a 83 y.o. female with medical history significant of hypertension, hyperlipidemia, diabetes, CAD status post CABG, CVA, carotid artery disease, CKD 2, GERD, RLS, chronic diastolic CHF, depression, anxiety, chronic respiratory failure, chronic pain presenting with altered mental status.  Patient recently admitted 10/14-10/20 with right hip fracture status post repair and discharged to SNF.  She was doing well until 2 days ago.  In her usual state of health and had been able to ambulate on her operative extremity.    Developed altered mental status starting yesterday.  Had outpatient lab workup showing AKI.  Brought to the ED for further evaluation today.  Able to move extremity but not able to answer questions.  Aphasic.  Unable to participate in review of systems.  ED Course: Vital signs in the ED notable for blood pressure in the 100s-110s systolic.  Heart rate in the 50s-60s.  Lab workup included CMP with bicarb 21, BUN 2074, creatinine 2.37 from baseline of 0.9, glucose 143, albumin 2.9, AST 1000, ALT 437, T. bili 1.6.  CBC with hemoglobin 9.1.  Lactic acid normal.  Troponin 779, 841 on repeat.  BNP elevated to 675.  Respiratory panel for flu COVID RSV negative.  VBG with normal pH and normal pCO2.  Urinalysis with glucose, hemoglobin, ketones, protein, leukocytes, bacteria.  Urine culture and blood cultures pending.  Chest x-ray showed left basilar airspace opacity with left pleural effusion.  CT head showed multiple acute to subacute infarcts bilaterally representing possible recent embolic event.  CT of the chest abdomen pelvis showed stable dependent airspace disease at the lung bases likely atelectasis.  Also noted was irregular contours to the liver.  Patient received cefepime ,  liter IV fluids in the ED.  Neurology consulted and recommended MRI and will see.  Cardiology consulted and will see.  Review of Systems: As per HPI otherwise all other systems reviewed and are negative.  Past Medical History:  Diagnosis Date   Acute kidney injury 08/05/2016   Acute respiratory failure with hypoxia (HCC) 04/19/2020   Acute stroke due to ischemia [facility])    Aftercare following surgery of the circulatory system, NEC 05/11/2013   AKI (acute kidney injury) 08/04/2016   Anxiety    takes Celexa  daily   ARF (acute renal failure) 11/07/2016   Asymptomatic stenosis of right carotid artery 03/22/2016   Back pain    occasionally   Carotid artery disease 04/16/2013   Carotid artery occlusion    Carotid stenosis 11/16/2013   Cataract    left and immature   Coronary artery disease    Coronary atherosclerosis of native coronary artery 03/11/2013   S/p CABG in 1997    Depression    Diabetes mellitus    takes Metformin  and Glipizide  daily   Diabetes mellitus (HCC) 05/02/2015   Dizziness    takes Meclizine  daily as needed   Essential hypertension, benign 03/11/2013   GERD (gastroesophageal reflux disease)    takes Omeprazole daily as needed   Headache(784.0)    Hyperlipidemia    takes Atorvastatin  daily   Hypertension    takes Carvedilol  daily   Mixed hyperlipidemia 03/11/2013   Muscle spasm    takes Robaxin  daily as needed   Nausea    takes Phenergan  daily as needed   Occlusion and stenosis of carotid  artery without mention of cerebral infarction 07/19/2011   Pneumonia    hx of-in high school   Restless leg    takes Requip  daily as needed   Seasonal allergies    takes Claritin  daily as needed and Afrin as needed   Severe sepsis (HCC) 02/14/2021   Shortness of breath    with exertion   Urinary urgency    UTI (urinary tract infection) 11/07/2016    Past Surgical History:  Procedure Laterality Date   COLONOSCOPY WITH PROPOFOL  N/A 03/10/2012   Procedure:  COLONOSCOPY WITH PROPOFOL ;  Surgeon: Gladis MARLA Louder, MD;  Location: [facility];  Service: Endoscopy;  Laterality: N/A;   CORNEAL TRANSPLANT Right    CORONARY ARTERY BYPASS GRAFT  1997   x 6   CORONARY ARTERY BYPASS GRAFT  Jan. 1997   ENDARTERECTOMY Left 04/16/2013   Procedure: Left Carotid Artery Endatarectomy with Resection of Redundant Internal Carotid Artery;  Surgeon: Krystal JULIANNA Doing, MD;  Location: [facility] OR;  Service: Vascular;  Laterality: Left;   ENDARTERECTOMY Right 03/22/2016   Procedure: RIGHT ENDARTERECTOMY CAROTID;  Surgeon: Krystal JULIANNA Doing, MD;  Location: [facility] OR;  Service: Vascular;  Laterality: Right;   ESOPHAGOGASTRODUODENOSCOPY N/A 03/10/2012   Procedure: ESOPHAGOGASTRODUODENOSCOPY (EGD);  Surgeon: Gladis MARLA Louder, MD;  Location: [facility];  Service: Endoscopy;  Laterality: N/A;   EYE SURGERY  March 12, 2001   CORNEA TRANSPLANT Right eye   INTRAMEDULLARY (IM) NAIL INTERTROCHANTERIC Right 10/22/2023   Procedure: FIXATION, FRACTURE, INTERTROCHANTERIC, WITH INTRAMEDULLARY ROD;  Surgeon: Reyne Cordella SQUIBB, MD;  Location: [facility] OR;  Service: Orthopedics;  Laterality: Right;   PATCH ANGIOPLASTY Right 03/22/2016   Procedure: PATCH ANGIOPLASTY;  Surgeon: Krystal JULIANNA Doing, MD;  Location: [facility] OR;  Service: Vascular;  Laterality: Right;   PR VEIN BYPASS GRAFT,AORTO-FEM-POP  1997   SPINE SURGERY  march 2013   Back surgery   TONSILLECTOMY     TRIGGER FINGER RELEASE Left    thumb    Social History  reports that she has never smoked. She has never used smokeless tobacco. She reports that she does not drink alcohol  and does not use drugs.  Allergies  Allergen Reactions   Latex Other (See Comments), Rash and Hives    tears skin   Codeine Other (See Comments)    Abnormal behavior   Morphine  And Codeine Other (See Comments)    Affects BP and blood sugar.   Cyclobenzaprine Other (See Comments)    MADE PT SICK- pt unsure if med was cyclobenzaprine or methocarbamol     Family History  Problem  Relation Age of Onset   Heart attack Father    Heart disease Father 6       Before age of 88   Hyperlipidemia Father    Heart disease Brother        Heart dissease before age 45   Hyperlipidemia Brother    Cirrhosis Mother    Heart attack Paternal Grandfather    Heart disease Paternal Grandfather    Cancer Maternal Grandmother    Alcoholism Maternal Grandfather    Heart attack Paternal Grandmother   Reviewed on admission  Prior to Admission medications   Medication Sig Start Date End Date Taking? Authorizing Provider  acetaminophen  (TYLENOL ) 500 MG tablet Take 1,000 mg by mouth at bedtime.    [provider]  amLODipine  (NORVASC ) 10 MG tablet Take 10 mg by mouth daily.    [provider]  aspirin  EC 81 MG tablet Take 81 mg by mouth  every evening.     [provider]  busPIRone  (BUSPAR ) 10 MG tablet Take 10 mg by mouth 3 (three) times daily. 01/25/23   [provider]  calcium  carbonate (TUMS - DOSED IN MG ELEMENTAL CALCIUM ) 500 MG chewable tablet Chew 1 tablet by mouth 3 (three) times daily before meals.    [provider]  Carboxymethylcellulose Sodium (REFRESH TEARS OP) Place 1 drop into both eyes every 4 (four) hours as needed (for dry eye).    [provider]  carvedilol  (COREG ) 6.25 MG tablet Take 1 tablet (6.25 mg total) by mouth 2 (two) times daily. 10/27/23   Perri DELENA Meliton Mickey., MD  chlorhexidine  (HIBICLENS ) 4 % external liquid Apply 15 mLs (1 Application total) topically as directed for 30 doses. Use as directed daily for 5 days every other week for 6 weeks. 10/22/23   Reyne Cordella SQUIBB, MD  citalopram  (CELEXA ) 20 MG tablet Take 20 mg by mouth daily.    [provider]  cyanocobalamin  1000 MCG tablet Take 1 tablet (1,000 mcg total) by mouth daily. 10/28/23 11/27/23  Perri DELENA Meliton Mickey., MD  diphenhydrAMINE -zinc acetate (BENADRYL ) cream Apply 1 Application topically daily. Apply to both arms every day shift for  itching    [provider]  docusate sodium  (COLACE) 100 MG capsule Take 100 mg by mouth 2 (two) times daily.    [provider]  empagliflozin (JARDIANCE) 25 MG TABS tablet Take 25 mg by mouth daily.    [provider]  enoxaparin  (LOVENOX ) 40 MG/0.4ML injection Inject 0.4 mLs (40 mg total) into the skin daily. 10/28/23 11/27/23  Perri DELENA Meliton Mickey., MD  estradiol (ESTRACE) 0.1 MG/GM vaginal cream Place 1 Applicatorful vaginally at bedtime. Twice daily for pain    [provider]  ferrous sulfate  325 (65 FE) MG tablet Take 325 mg by mouth daily. Monday-Friday    [provider]  fluticasone (CUTIVATE) 0.005 % ointment Apply 1 Application topically in the morning, at noon, and at bedtime. Apply to forearms and legs every shift    [provider]  furosemide  (LASIX ) 20 MG tablet Take 1 tablet (20 mg total) by mouth daily as needed for fluid or edema. take once daily as needed for weight gain of 2-3 lbs overnight or 5 lbs in 1 week. 10/27/23   Perri DELENA Meliton Mickey., MD  gabapentin  (NEURONTIN ) 300 MG capsule Take 300 mg by mouth 3 (three) times daily. 01/25/23   [provider]  glipiZIDE  (GLUCOTROL  XL) 5 MG 24 hr tablet Take 1 tablet (5 mg total) by mouth daily with breakfast. 04/21/20   Milissa Tod PARAS, MD  hyoscyamine  (LEVSIN  SL) 0.125 MG SL tablet Place 1 tablet (0.125 mg total) under the tongue 3 (three) times daily with meals as needed. Patient taking differently: Place 0.125 mg under the tongue 3 (three) times daily before meals. 07/30/23   May, Deanna J, NP  levocetirizine (XYZAL) 5 MG tablet Take 5 mg by mouth every evening.    [provider]  magnesium  oxide (MAG-OX) 400 (240 Mg) MG tablet Take 400 mg by mouth at bedtime.    [provider]  meclizine  (ANTIVERT ) 25 MG tablet Take 25 mg by mouth 3 (three) times daily.    [provider]  melatonin 3 MG TABS tablet Take 3 mg by mouth at bedtime.     [provider]  methocarbamol  (ROBAXIN ) 750 MG tablet Take 750 mg by mouth 2 (two) times daily. 10/31/22  [provider]  mupirocin  ointment (BACTROBAN ) 2 % Place 1 Application into the nose 2 (two) times daily for 60 doses. Use as directed 2 times daily for 5 days every other week for 6 weeks. 10/22/23 11/21/23  Reyne Cordella SQUIBB, MD  nystatin powder Apply 1 Application topically 2 (two) times daily. Apply under breasts    [provider]  omeprazole (PRILOSEC) 40 MG capsule Take 40 mg by mouth 2 (two) times daily. 10/03/23   [provider]  oxybutynin  (DITROPAN -XL) 10 MG 24 hr tablet Take 10 mg by mouth daily.    [provider]  OXYGEN  Inhale 2 L/min into the lungs continuous.    [provider]  pantoprazole  (PROTONIX ) 40 MG tablet Take 40 mg by mouth daily. Patient not taking: Reported on 10/22/2023    [provider]  rosuvastatin  (CRESTOR ) 10 MG tablet Take 10 mg by mouth daily.    [provider]  saccharomyces boulardii (FLORASTOR) 250 MG capsule Take 250 mg by mouth daily.    [provider]  SALINE NASAL SPRAY NA Place 1 spray into alternate nostrils in the morning and at bedtime.    [provider]  senna (SENOKOT) 8.6 MG tablet Take 2 tablets by mouth at bedtime.    [provider]  sodium zirconium cyclosilicate  (LOKELMA ) 10 g PACK packet Take 10 g by mouth daily.    [provider]  valsartan (DIOVAN) 80 MG tablet Take 80 mg by mouth daily. 12/03/21   [provider]    Physical Exam: Vitals:   11/03/23 1023 11/03/23 1400  BP: (!) 106/55 (!) 116/54  Pulse: 64 (!) 51  Resp: 16 16  Temp: 97.7 F (36.5 C) 98 F (36.7 C)  TempSrc: Oral Oral  SpO2: 100% 100%    Physical Exam Constitutional:      General: She is not in acute distress.    Appearance: Normal appearance.  HENT:     Head: Normocephalic and atraumatic.     Mouth/Throat:     Mouth: Mucous  membranes are moist.     Pharynx: Oropharynx is clear.  Eyes:     Extraocular Movements: Extraocular movements intact.     Pupils: Pupils are equal, round, and reactive to light.  Cardiovascular:     Rate and Rhythm: Normal rate and regular rhythm.     Pulses: Normal pulses.     Heart sounds: Normal heart sounds.  Pulmonary:     Effort: Pulmonary effort is normal. No respiratory distress.     Breath sounds: Normal breath sounds.  Abdominal:     General: Bowel sounds are normal. There is no distension.     Palpations: Abdomen is soft.     Tenderness: There is no abdominal tenderness.  Musculoskeletal:        General: No swelling or deformity.  Skin:    General: Skin is warm and dry.  Neurological:     Comments: Alert, intermittent confusion, difficulty with phonation but understands and follows simple commands. Strength and sensation grossly intact    Labs on Admission: I have personally reviewed following labs and imaging studies  CBC: Recent Labs  Lab 11/03/23 1104 11/03/23 1105 11/03/23 1115  WBC  --   --  9.1  NEUTROABS  --   --  7.2  HGB 9.2* 9.5* 9.1*  HCT 27.0* 28.0* 29.1*  MCV  --   --  109.4*  PLT  --   --  282    Basic  Metabolic Panel: Recent Labs  Lab 11/03/23 1104 11/03/23 1105 11/03/23 1115  NA 140 140 141  K 4.4 4.4 4.4  CL  --  105 103  CO2  --   --  21*  GLUCOSE  --  141* 143*  BUN  --  72* 74*  CREATININE  --  2.40* 2.37*  CALCIUM   --   --  8.9    GFR: Estimated Creatinine Clearance: 18.4 mL/min (A) (by C-G formula based on SCr of 2.37 mg/dL (H)).  Liver Function Tests: Recent Labs  Lab 11/03/23 1115  AST 1,001*  ALT 437*  ALKPHOS 98  BILITOT 1.6*  PROT 7.2  ALBUMIN 2.9*    Urine analysis:    Component Value Date/Time   COLORURINE AMBER (A) 11/03/2023 1200   APPEARANCEUR CLOUDY (A) 11/03/2023 1200   LABSPEC 1.016 11/03/2023 1200   PHURINE 5.0 11/03/2023 1200   GLUCOSEU >=500 (A) 11/03/2023 1200   HGBUR LARGE (A)  11/03/2023 1200   BILIRUBINUR NEGATIVE 11/03/2023 1200   KETONESUR 5 (A) 11/03/2023 1200   PROTEINUR 100 (A) 11/03/2023 1200   UROBILINOGEN 1.0 06/17/2014 2145   NITRITE NEGATIVE 11/03/2023 1200   LEUKOCYTESUR LARGE (A) 11/03/2023 1200    Radiological Exams on Admission: CT CHEST ABDOMEN PELVIS WO CONTRAST Result Date: 11/03/2023 CLINICAL DATA:  Altered mental status.  Sepsis suspected. EXAM: CT CHEST, ABDOMEN AND PELVIS WITHOUT CONTRAST TECHNIQUE: Multidetector CT imaging of the chest, abdomen and pelvis was performed following the standard protocol without IV contrast. RADIATION DOSE REDUCTION: This exam was performed according to the departmental dose-optimization program which includes automated exposure control, adjustment of the mA and/or kV according to patient size and/or use of iterative reconstruction technique. COMPARISON:  CT of the chest, abdomen and pelvis 10/21/2023. Abdominopelvic CT 05/28/2022. FINDINGS: CT CHEST FINDINGS Cardiovascular: Atherosclerosis of the aorta, great vessels and coronary arteries status post median sternotomy and CABG. The heart is mildly enlarged. No significant pericardial fluid. Mediastinum/Nodes: There are no enlarged mediastinal, hilar or axillary lymph nodes. Small hiatal hernia again noted. The thyroid  gland appears unremarkable. Lungs/Pleura: Dependent pleural thickening bilaterally without significant pleural fluid. No pneumothorax. Dependent airspace opacities at both lung bases, similar to recent prior CT, likely atelectasis. No confluent airspace disease. Musculoskeletal/Chest wall: No chest wall mass or suspicious osseous findings. Healed median sternotomy. Multilevel spondylosis associated with a mild convex right thoracic scoliosis. CT ABDOMEN AND PELVIS FINDINGS Hepatobiliary: No acute or focal abnormalities are identified on noncontrast imaging. Mild contour irregularity of the liver suggested, potentially due to mild underlying cirrhosis. No  evidence of gallstones, gallbladder wall thickening or biliary dilatation. Pancreas: Unremarkable. No pancreatic ductal dilatation or surrounding inflammatory changes. Spleen: Normal in size without focal abnormality. Adrenals/Urinary Tract: Both adrenal glands appear normal. The kidneys appears stable, without evidence of urinary tract calculus or hydronephrosis. No suspicious renal lesions are identified on noncontrast imaging. A cyst projecting posteriorly from the lower pole of the left kidney appears unchanged, measuring 3.1 cm on image 73/5. No specific follow-up imaging recommended. The bladder appears normal for its degree of distention. Stomach/Bowel: No enteric contrast administered. The stomach appears unremarkable for its degree of distension. No evidence of bowel wall thickening, distention or surrounding inflammatory change. Mild colonic diverticulosis. Vascular/Lymphatic: There are no enlarged abdominal or pelvic lymph nodes. Aortic and branch vessel atherosclerosis without evidence of aneurysm. Reproductive: The uterus and ovaries appear unremarkable. No adnexal mass. Other: No evidence of abdominal wall mass or hernia. No ascites or pneumoperitoneum. Musculoskeletal: Interval proximal  right femoral ORIF with improved alignment of the previously demonstrated intertrochanteric fracture. There are small hematomas within the lateral subcutaneous fat, measuring up to 4.3 cm in diameter. No evidence of pelvic hematoma. No new fractures are identified. There are degenerative and postsurgical changes in the lower lumbar spine. IMPRESSION: 1. No acute findings or clear explanation for the patient's symptoms. 2. Interval proximal right femoral ORIF with improved alignment of the previously demonstrated intertrochanteric fracture. There are small hematomas within the lateral subcutaneous fat. 3. Dependent airspace opacities at both lung bases, similar to recent prior CT, likely atelectasis. 4. Mild contour  irregularity of the liver, potentially due to mild underlying cirrhosis. 5.  Aortic Atherosclerosis (ICD10-I70.0). Electronically Signed   By: Elsie Perone M.D.   On: 11/03/2023 12:07   CT Head Wo Contrast Result Date: 11/03/2023 EXAM: CT HEAD WITHOUT CONTRAST 11/03/2023 11:29:35 AM TECHNIQUE: CT of the head was performed without the administration of intravenous contrast. Automated exposure control, iterative reconstruction, and/or weight based adjustment of the mA/kV was utilized to reduce the radiation dose to as low as reasonably achievable. COMPARISON: Brain MRI 08/28/2022. Head CT 10/21/2023. CLINICAL HISTORY: 83 year old female. Mental status change, unknown cause. Noted confusion, saying words that don't make sense. Thought to be hyperkalemic. FINDINGS: BRAIN AND VENTRICLES: No acute hemorrhage. Brain volume remains normal for age. Multiple acute to subacute appearing infarcts are present in both cerebral hemispheres and the left cerebellum (largest). Specifically, there is a small new 2 cm area of cytotoxic edema in the anterior left middle frontal gyrus, anterior left MCA territory (series 3 image 12), with no regional mass effect. A larger area of confluent cytotoxic edema is seen in the left cerebellum, left PICA or AICA territory (series 3 image 25). Additionally, there is a small cortical infarct in the right lateral occipital lobe, approximately 1 cm on series 3 image 18. These findings are new from the recent head CT of 10/21/2023. No hemorrhagic transformation. No significant intracranial mass effect. No ventriculomegaly. No hydrocephalus. No extra-axial collection. No midline shift. Calcified atherosclerosis at the skull base. No suspicious intracranial vascular hyperdensity. The pattern of these findings suggests sequelae of a recent embolic event. ORBITS: No acute abnormality. SINUSES: No acute abnormality. SOFT TISSUES AND SKULL: No acute soft tissue abnormality. No skull fracture.  IMPRESSION: 1. Multiple acute to subacute appearing infarcts in both cerebral hemispheres and the left cerebellum (largest). Pattern suggests a recent embolic event. 2. No hemorrhagic transformation or intracranial mass effect. Electronically signed by: Helayne Hurst MD 11/03/2023 11:39 AM EDT RP Workstation: [id number]   DG Chest Port 1 View Result Date: 11/03/2023 EXAM: 1 VIEW(S) XRAY OF THE CHEST 11/03/2023 10:37:00 AM COMPARISON: None available. CLINICAL HISTORY: Questionable sepsis - evaluate for abnormality. FINDINGS: LUNGS AND PLEURA: Low lung volumes. Left basilar airspace opacities. Small left pleural effusion. HEART AND MEDIASTINUM: Stable Cardiomegaly. Sternotomy wires and mediastinal surgical clips suggesting CABG. BONES AND SOFT TISSUES: No acute osseous abnormality. IMPRESSION: 1. Left basilar airspace opacities with a small left pleural effusion. Electronically signed by: Oneil Devonshire MD 11/03/2023 11:11 AM EDT RP Workstation: [id number]   EKG: Independently reviewed.  Sinus rhythm at 60 bpm.  Right bundle branch block.  New T wave inversions in aVL, V2, V3.  Assessment/Plan Principal Problem:   Acute CVA (cerebrovascular accident) [facility]) Active Problems:   Coronary artery disease   Carotid artery disease   Acute renal failure superimposed on stage 2 chronic kidney disease   Depression   Chronic pain   hx  of CVA (cerebral vascular accident) (HCC)   Chronic back pain   Controlled type 2 diabetes mellitus with hyperglycemia (HCC)   Essential hypertension   HLD (hyperlipidemia)   CKD (chronic kidney disease) stage 2, GFR 60-89 ml/min   Acute encephalopathy   Anxiety and depression   Chronic respiratory failure with hypoxia (HCC)   Transaminitis   High anion gap metabolic acidosis   Uremia   NSTEMI (non-ST elevated myocardial infarction) (HCC)   Acute on chronic diastolic CHF (congestive heart failure) (HCC)   Acute encephalopathy > Likely multifactorial. > Noted to have AKI,  UTI, uremia, NSTEMI, elevated BNP, CVA. - Correct derangements and treat multiple presenting issues as below  Acute CVA History of prior CVA > Present with altered mental status.  Workup included multiple possible etiologies as above.  CT head noted multiple acute to subacute infarcts consistent with recent embolic event.  Surprising as she was supposedly on Lovenox  after hip surgery recently. > Neurology consulted and recommended MRI and will see the patient. - Appreciate neurology recommendations and assistance - Allow for permissive HTN (systolic < 220 and diastolic < 120)  - Continue home aspirin  - Continue home rosuvastatin  - Echocardiogram  - Plan for CTA head & neck, will order after MRI - A1C  - Lipid panel  - Tele monitoring  - SLP eval - PT/OT - DVT study given recent surgery  AKI Uremia Elevated anion gap acidosis > Patient appeared dry on exam.  Creatinine elevated 2.37 from baseline 0.9.  BUN 74.  Bicarb 21, gap 17. > Elevated BUN may be contributing to altered mental status; dehydration could as well. > Received 1 L IV fluids in the ED.  Will continue with IV fluids and monitor closely with history of CHF. - Continue with IV fluids - Trend renal function and electrolytes  CAD NSTEMI Elevated BNP > History of CABG.  Noted to have troponin elevated to 779 and 841 on repeat.  BNP 675, the patient appears dry on exam with AKI as above. > Unclear if there is strain from dehydration versus this being a primary driver of heart strain and possible cardiac embolism source for suspected CVA above. > Cardiology consulted and will see the patient. - Monitor on progressive unit - Appreciate cardiology recommendations and assistance - Continue ASA, rosuvastatin  - Will defer to cardiology on possible anticoagulation, will evaluate for any hemorrhagic conversion on MRI - Trend troponin - Echocardiogram as above  UTI > No leukocytosis, however urine shows hemoglobin, protein,  leukocytes, bacteria. > Urine culture and blood cultures ordered.  Received cefepime  in the ED. - Transition to ceftriaxone  - Follow-up urine culture and blood culture - Trend fever curve and WBC  Transaminitis > LFTs elevated with AST 1001, and a ALT 437.  T. bili borderline elevated at 1.6 and alk phos normal. > Could be secondary to shock liver from cardiac etiology above versus if there was a thromboembolic event could be secondary to this. > Will continue with cardiac evaluation and VTE evaluation as above. - Trend CMP  Chronic diastolic CHF ?Rule out exacerbation > Last echo was this month with EF greater than 70%, G2 DD, normal RV function. > Appears dry on exam with AKI, received IV fluids.  BNP elevated that this may be strain from NSTEMI. - Continue with IV fluids - Trend renal function and electrolytes - Check magnesium  - I's and O's, daily weights - Supportive care  Hypertension - Permissive hypertension as above  Hyperlipidemia - Continue  rosuvastatin   Diabetes - SSI  Carotid artery disease - Continue ASA and rosuvastatin  as above  Depression Anxiety - Continue BuSpar  and Celexa  when able  Chronic respiratory failure with hypoxia - Noted  DVT prophylaxis: SCDs for now Code Status:   Full Family Communication:  Brother updated by phone (is an MD), request further updated as needed. Disposition Plan:   Patient is from:  Home  Anticipated DC to:  Home  Anticipated DC date:  2 to 7 days  Anticipated DC barriers: None  Consults called:  Neurology, cardiology Admission status:  Inpatient, progressive  Severity of Illness: The appropriate patient status for this patient is INPATIENT. Inpatient status is judged to be reasonable and necessary in order to provide the required intensity of service to ensure the patient's safety. The patient's presenting symptoms, physical exam findings, and initial radiographic and laboratory data in the context of their chronic  comorbidities is felt to place them at high risk for further clinical deterioration. Furthermore, it is not anticipated that the patient will be medically stable for discharge from the hospital within 2 midnights of admission.   * I certify that at the point of admission it is my clinical judgment that the patient will require inpatient hospital care spanning beyond 2 midnights from the point of admission due to high intensity of service, high risk for further deterioration and high frequency of surveillance required.DEWAINE Marsa KATHEE Seena MD [facility]  How to contact the [facility] Attending or Consulting provider 7A - 7P or covering provider during after hours 7P -7A, for this patient?   Check the care team in [facility] and look for a) attending/consulting [facility] provider listed and b) the [facility] team listed Log into [email or web address] and use [place]'s universal password to access. If you do not have the password, please contact the hospital operator. Locate the [facility] provider you are looking for under [facility] and page to a number that you can be directly reached. If you still have difficulty reaching the provider, please page the [facility] (Director on Call) for the Hospitalists listed on amion for assistance.  11/03/2023, 3:11 PM

## 2023-11-03 NOTE — ED Provider Notes (Signed)
 [place] EMERGENCY DEPARTMENT AT [facility] Provider Note   CSN: [id number] Arrival date & time: 11/03/23  1015     Patient presents with: Altered Mental Status   Andrea Kirk is a 83 y.o. female.   HPI Patient presents for altered mental status.  Medical history includes CAD, HTN, HLD, GERD, DM, depression, anxiety, CVA, CKD.  She is currently 12 days postop from repair of a right hip fracture.  She had postoperative anemia and received 2 units PRBCs.  She was discharged 7 days ago.  Per EMS, nursing facility reported that she was in her normal state of health 2 days ago.  She was reportedly walking on her recently repaired hip.  She is alert, oriented and conversant at baseline.  She had an acute change in her mental status yesterday.  She underwent outpatient lab work yesterday which showed AKI.    Prior to Admission medications   Medication Sig Start Date End Date Taking? Authorizing Provider  acetaminophen  (TYLENOL ) 500 MG tablet Take 1,000 mg by mouth at bedtime.    [provider]  amLODipine  (NORVASC ) 10 MG tablet Take 10 mg by mouth daily.    [provider]  aspirin  EC 81 MG tablet Take 81 mg by mouth every evening.     [provider]  busPIRone  (BUSPAR ) 10 MG tablet Take 10 mg by mouth 3 (three) times daily. 01/25/23   [provider]  calcium  carbonate (TUMS - DOSED IN MG ELEMENTAL CALCIUM ) 500 MG chewable tablet Chew 1 tablet by mouth 3 (three) times daily before meals.    [provider]  Carboxymethylcellulose Sodium (REFRESH TEARS OP) Place 1 drop into both eyes every 4 (four) hours as needed (for dry eye).    [provider]  carvedilol  (COREG ) 6.25 MG tablet Take 1 tablet (6.25 mg total) by mouth 2 (two) times daily. 10/27/23   Perri DELENA Meliton Mickey., MD  chlorhexidine  (HIBICLENS ) 4 % external liquid Apply 15 mLs (1 Application total) topically as directed for 30 doses. Use as directed daily for 5 days  every other week for 6 weeks. 10/22/23   Reyne Cordella SQUIBB, MD  citalopram  (CELEXA ) 20 MG tablet Take 20 mg by mouth daily.    [provider]  cyanocobalamin  1000 MCG tablet Take 1 tablet (1,000 mcg total) by mouth daily. 10/28/23 11/27/23  Perri DELENA Meliton Mickey., MD  diphenhydrAMINE -zinc acetate (BENADRYL ) cream Apply 1 Application topically daily. Apply to both arms every day shift for itching    [provider]  docusate sodium  (COLACE) 100 MG capsule Take 100 mg by mouth 2 (two) times daily.    [provider]  empagliflozin (JARDIANCE) 25 MG TABS tablet Take 25 mg by mouth daily.    [provider]  enoxaparin  (LOVENOX ) 40 MG/0.4ML injection Inject 0.4 mLs (40 mg total) into the skin daily. 10/28/23 11/27/23  Perri DELENA Meliton Mickey., MD  estradiol (ESTRACE) 0.1 MG/GM vaginal cream Place 1 Applicatorful vaginally at bedtime. Twice daily for pain    [provider]  ferrous sulfate  325 (65 FE) MG tablet Take 325 mg by mouth daily. Monday-Friday    [provider]  fluticasone (CUTIVATE) 0.005 % ointment Apply 1 Application topically in the morning, at noon, and at bedtime. Apply to forearms and legs every shift    [provider]  furosemide  (LASIX ) 20 MG tablet Take 1 tablet (20 mg total) by mouth daily as needed for fluid or edema. take once  daily as needed for weight gain of 2-3 lbs overnight or 5 lbs in 1 week. 10/27/23   Perri DELENA Meliton Mickey., MD  gabapentin  (NEURONTIN ) 300 MG capsule Take 300 mg by mouth 3 (three) times daily. 01/25/23   [provider]  glipiZIDE  (GLUCOTROL  XL) 5 MG 24 hr tablet Take 1 tablet (5 mg total) by mouth daily with breakfast. 04/21/20   Milissa Tod PARAS, MD  hyoscyamine  (LEVSIN  SL) 0.125 MG SL tablet Place 1 tablet (0.125 mg total) under the tongue 3 (three) times daily with meals as needed. Patient taking differently: Place 0.125 mg under the tongue 3 (three) times daily before meals. 07/30/23    May, Deanna J, NP  levocetirizine (XYZAL) 5 MG tablet Take 5 mg by mouth every evening.    [provider]  magnesium  oxide (MAG-OX) 400 (240 Mg) MG tablet Take 400 mg by mouth at bedtime.    [provider]  meclizine  (ANTIVERT ) 25 MG tablet Take 25 mg by mouth 3 (three) times daily.    [provider]  melatonin 3 MG TABS tablet Take 3 mg by mouth at bedtime.    [provider]  methocarbamol  (ROBAXIN ) 750 MG tablet Take 750 mg by mouth 2 (two) times daily. 10/31/22   [provider]  mupirocin  ointment (BACTROBAN ) 2 % Place 1 Application into the nose 2 (two) times daily for 60 doses. Use as directed 2 times daily for 5 days every other week for 6 weeks. 10/22/23 11/21/23  Reyne Cordella SQUIBB, MD  nystatin powder Apply 1 Application topically 2 (two) times daily. Apply under breasts    [provider]  omeprazole (PRILOSEC) 40 MG capsule Take 40 mg by mouth 2 (two) times daily. 10/03/23   [provider]  oxybutynin  (DITROPAN -XL) 10 MG 24 hr tablet Take 10 mg by mouth daily.    [provider]  OXYGEN  Inhale 2 L/min into the lungs continuous.    [provider]  pantoprazole  (PROTONIX ) 40 MG tablet Take 40 mg by mouth daily. Patient not taking: Reported on 10/22/2023    [provider]  rosuvastatin  (CRESTOR ) 10 MG tablet Take 10 mg by mouth daily.    [provider]  saccharomyces boulardii (FLORASTOR) 250 MG capsule Take 250 mg by mouth daily.    [provider]  SALINE NASAL SPRAY NA Place 1 spray into alternate nostrils in the morning and at bedtime.    [provider]  senna (SENOKOT) 8.6 MG tablet Take 2 tablets by mouth at bedtime.    [provider]  sodium zirconium cyclosilicate  (LOKELMA ) 10 g PACK packet Take 10 g by mouth daily.    [provider]  valsartan (DIOVAN) 80 MG tablet Take 80 mg by mouth daily. 12/03/21   [provider]     Allergies: Latex, Codeine, Morphine  and codeine, and Cyclobenzaprine    Review of Systems  Unable to perform ROS: Mental status change    Updated Vital Signs BP (!) 106/55   Pulse 64   Temp 97.7 F (36.5 C) (Oral)   Resp 16   SpO2 100%   Physical Exam Constitutional:      Appearance: She is ill-appearing. She is not toxic-appearing or diaphoretic.  HENT:     Head: Normocephalic and atraumatic.     Right Ear: External ear normal.     Left Ear: External ear normal.     Nose: Nose normal.     Mouth/Throat:  Mouth: Mucous membranes are dry.  Eyes:     Extraocular Movements: Extraocular movements intact.  Cardiovascular:     Rate and Rhythm: Normal rate and regular rhythm.     Heart sounds: No murmur heard. Pulmonary:     Effort: Pulmonary effort is normal. No respiratory distress.     Breath sounds: No wheezing.  Abdominal:     General: There is no distension.     Palpations: Abdomen is soft.     Tenderness: There is no abdominal tenderness.  Musculoskeletal:        General: Tenderness present. No deformity.     Cervical back: Normal range of motion.  Skin:    General: Skin is warm and dry.  Neurological:     General: No focal deficit present.     Mental Status: She is disoriented.     (all labs ordered are listed, but only abnormal results are displayed) Labs Reviewed  COMPREHENSIVE METABOLIC PANEL WITH GFR - Abnormal; Notable for the following components:      Result Value   CO2 21 (*)    Glucose, Bld 143 (*)    BUN 74 (*)    Creatinine, Ser 2.37 (*)    Albumin 2.9 (*)    AST 1,001 (*)    ALT 437 (*)    Total Bilirubin 1.6 (*)    GFR, Estimated 20 (*)    Anion gap 17 (*)    All other components within normal limits  CBC WITH DIFFERENTIAL/PLATELET - Abnormal; Notable for the following components:   RBC 2.66 (*)    Hemoglobin 9.1 (*)    HCT 29.1 (*)    MCV 109.4 (*)    MCH 34.2 (*)    RDW 16.4 (*)    All other components within normal limits   BRAIN NATRIURETIC PEPTIDE - Abnormal; Notable for the following components:   B Natriuretic Peptide 673.1 (*)    All other components within normal limits  URINALYSIS, W/ REFLEX TO CULTURE (INFECTION SUSPECTED) - Abnormal; Notable for the following components:   Color, Urine AMBER (*)    APPearance CLOUDY (*)    Glucose, UA >=500 (*)    Hgb urine dipstick LARGE (*)    Ketones, ur 5 (*)    Protein, ur 100 (*)    Leukocytes,Ua LARGE (*)    Bacteria, UA MANY (*)    All other components within normal limits  I-STAT CHEM 8, ED - Abnormal; Notable for the following components:   BUN 72 (*)    Creatinine, Ser 2.40 (*)    Glucose, Bld 141 (*)    Hemoglobin 9.5 (*)    HCT 28.0 (*)    All other components within normal limits  I-STAT VENOUS BLOOD GAS, ED - Abnormal; Notable for the following components:   pO2, Ven 49 (*)    HCT 27.0 (*)    Hemoglobin 9.2 (*)    All other components within normal limits  TROPONIN I (HIGH SENSITIVITY) - Abnormal; Notable for the following components:   Troponin I (High Sensitivity) 779 (*)    All other components within normal limits  TROPONIN I (HIGH SENSITIVITY) - Abnormal; Notable for the following components:   Troponin I (High Sensitivity) 841 (*)    All other components within normal limits  RESP PANEL BY RT-PCR (RSV, FLU A&B, COVID)  RVPGX2  CULTURE, BLOOD (ROUTINE X 2)  CULTURE, BLOOD (ROUTINE X 2)  URINE CULTURE  I-STAT CG4 LACTIC ACID, ED  EKG: EKG Interpretation Date/Time:  Monday November 03 2023 10:47:17 EDT Ventricular Rate:  60 PR Interval:  68 QRS Duration:  123 QT Interval:  481 QTC Calculation: 481 R Axis:   45  Text Interpretation: Sinus rhythm Short PR interval Right bundle branch block Abnormal T, consider ischemia, lateral leads Confirmed by Melvenia Motto [phone]) on 11/03/2023 10:56:01 AM  Radiology: CT CHEST ABDOMEN PELVIS WO CONTRAST Result Date: 11/03/2023 CLINICAL DATA:  Altered mental status.  Sepsis suspected. EXAM:  CT CHEST, ABDOMEN AND PELVIS WITHOUT CONTRAST TECHNIQUE: Multidetector CT imaging of the chest, abdomen and pelvis was performed following the standard protocol without IV contrast. RADIATION DOSE REDUCTION: This exam was performed according to the departmental dose-optimization program which includes automated exposure control, adjustment of the mA and/or kV according to patient size and/or use of iterative reconstruction technique. COMPARISON:  CT of the chest, abdomen and pelvis 10/21/2023. Abdominopelvic CT 05/28/2022. FINDINGS: CT CHEST FINDINGS Cardiovascular: Atherosclerosis of the aorta, great vessels and coronary arteries status post median sternotomy and CABG. The heart is mildly enlarged. No significant pericardial fluid. Mediastinum/Nodes: There are no enlarged mediastinal, hilar or axillary lymph nodes. Small hiatal hernia again noted. The thyroid  gland appears unremarkable. Lungs/Pleura: Dependent pleural thickening bilaterally without significant pleural fluid. No pneumothorax. Dependent airspace opacities at both lung bases, similar to recent prior CT, likely atelectasis. No confluent airspace disease. Musculoskeletal/Chest wall: No chest wall mass or suspicious osseous findings. Healed median sternotomy. Multilevel spondylosis associated with a mild convex right thoracic scoliosis. CT ABDOMEN AND PELVIS FINDINGS Hepatobiliary: No acute or focal abnormalities are identified on noncontrast imaging. Mild contour irregularity of the liver suggested, potentially due to mild underlying cirrhosis. No evidence of gallstones, gallbladder wall thickening or biliary dilatation. Pancreas: Unremarkable. No pancreatic ductal dilatation or surrounding inflammatory changes. Spleen: Normal in size without focal abnormality. Adrenals/Urinary Tract: Both adrenal glands appear normal. The kidneys appears stable, without evidence of urinary tract calculus or hydronephrosis. No suspicious renal lesions are identified on  noncontrast imaging. A cyst projecting posteriorly from the lower pole of the left kidney appears unchanged, measuring 3.1 cm on image 73/5. No specific follow-up imaging recommended. The bladder appears normal for its degree of distention. Stomach/Bowel: No enteric contrast administered. The stomach appears unremarkable for its degree of distension. No evidence of bowel wall thickening, distention or surrounding inflammatory change. Mild colonic diverticulosis. Vascular/Lymphatic: There are no enlarged abdominal or pelvic lymph nodes. Aortic and branch vessel atherosclerosis without evidence of aneurysm. Reproductive: The uterus and ovaries appear unremarkable. No adnexal mass. Other: No evidence of abdominal wall mass or hernia. No ascites or pneumoperitoneum. Musculoskeletal: Interval proximal right femoral ORIF with improved alignment of the previously demonstrated intertrochanteric fracture. There are small hematomas within the lateral subcutaneous fat, measuring up to 4.3 cm in diameter. No evidence of pelvic hematoma. No new fractures are identified. There are degenerative and postsurgical changes in the lower lumbar spine. IMPRESSION: 1. No acute findings or clear explanation for the patient's symptoms. 2. Interval proximal right femoral ORIF with improved alignment of the previously demonstrated intertrochanteric fracture. There are small hematomas within the lateral subcutaneous fat. 3. Dependent airspace opacities at both lung bases, similar to recent prior CT, likely atelectasis. 4. Mild contour irregularity of the liver, potentially due to mild underlying cirrhosis. 5.  Aortic Atherosclerosis (ICD10-I70.0). Electronically Signed   By: Elsie Perone M.D.   On: 11/03/2023 12:07   CT Head Wo Contrast Result Date: 11/03/2023 EXAM: CT HEAD WITHOUT CONTRAST 11/03/2023 11:29:35 AM  TECHNIQUE: CT of the head was performed without the administration of intravenous contrast. Automated exposure control,  iterative reconstruction, and/or weight based adjustment of the mA/kV was utilized to reduce the radiation dose to as low as reasonably achievable. COMPARISON: Brain MRI 08/28/2022. Head CT 10/21/2023. CLINICAL HISTORY: 83 year old female. Mental status change, unknown cause. Noted confusion, saying words that don't make sense. Thought to be hyperkalemic. FINDINGS: BRAIN AND VENTRICLES: No acute hemorrhage. Brain volume remains normal for age. Multiple acute to subacute appearing infarcts are present in both cerebral hemispheres and the left cerebellum (largest). Specifically, there is a small new 2 cm area of cytotoxic edema in the anterior left middle frontal gyrus, anterior left MCA territory (series 3 image 12), with no regional mass effect. A larger area of confluent cytotoxic edema is seen in the left cerebellum, left PICA or AICA territory (series 3 image 25). Additionally, there is a small cortical infarct in the right lateral occipital lobe, approximately 1 cm on series 3 image 18. These findings are new from the recent head CT of 10/21/2023. No hemorrhagic transformation. No significant intracranial mass effect. No ventriculomegaly. No hydrocephalus. No extra-axial collection. No midline shift. Calcified atherosclerosis at the skull base. No suspicious intracranial vascular hyperdensity. The pattern of these findings suggests sequelae of a recent embolic event. ORBITS: No acute abnormality. SINUSES: No acute abnormality. SOFT TISSUES AND SKULL: No acute soft tissue abnormality. No skull fracture. IMPRESSION: 1. Multiple acute to subacute appearing infarcts in both cerebral hemispheres and the left cerebellum (largest). Pattern suggests a recent embolic event. 2. No hemorrhagic transformation or intracranial mass effect. Electronically signed by: Helayne Hurst MD 11/03/2023 11:39 AM EDT RP Workstation: [id number]   DG Chest Port 1 View Result Date: 11/03/2023 EXAM: 1 VIEW(S) XRAY OF THE CHEST 11/03/2023  10:37:00 AM COMPARISON: None available. CLINICAL HISTORY: Questionable sepsis - evaluate for abnormality. FINDINGS: LUNGS AND PLEURA: Low lung volumes. Left basilar airspace opacities. Small left pleural effusion. HEART AND MEDIASTINUM: Stable Cardiomegaly. Sternotomy wires and mediastinal surgical clips suggesting CABG. BONES AND SOFT TISSUES: No acute osseous abnormality. IMPRESSION: 1. Left basilar airspace opacities with a small left pleural effusion. Electronically signed by: Oneil Devonshire MD 11/03/2023 11:11 AM EDT RP Workstation: [id number]     Procedures   Medications Ordered in the ED  ceFEPIme  (MAXIPIME ) 2 g in sodium chloride  0.9 % 100 mL IVPB (has no administration in time range)  lactated ringers  bolus 1,000 mL (0 mLs Intravenous Stopped 11/03/23 1234)                                    Medical Decision Making Amount and/or Complexity of Data Reviewed Labs: ordered. Radiology: ordered.  Risk Decision regarding hospitalization.   This patient presents to the ED for concern of altered mental status, this involves an extensive number of treatment options, and is a complaint that carries with it a high risk of complications and morbidity.  The differential diagnosis includes CVA, seizure, polypharmacy, metabolic derangements, infection   Co morbidities / Chronic conditions that complicate the patient evaluation  CAD, HTN, HLD, GERD, DM, depression, anxiety, CVA, CKD   Additional history obtained:  Additional history obtained from EMR External records from outside source obtained and reviewed including EMS   Lab Tests:  I Ordered, and personally interpreted labs.  The pertinent results include: Baseline anemia, no leukocytosis.  AKI and azotemia are present.  BNP and troponin are elevated.  Transaminases elevated as well.  Findings consistent with multiorgan failure   Imaging Studies ordered:  I ordered imaging studies including IV fluids for hydration, cefepime  for  UTI I independently visualized and interpreted imaging which showed stayed the same I agree with the radiologist interpretation   Cardiac Monitoring: / EKG:  The patient was maintained on a cardiac monitor.  I personally viewed and interpreted the cardiac monitored which showed an underlying rhythm of: Sinus rhythm   Problem List / ED Course / Critical interventions / Medication management  Patient presenting for altered mental status.  She arrives via EMS from nursing facility.  Nursing facility noticed a change in mentation yesterday.  On arrival in the ED, patient is able to follow basic commands.  She is not able to provide any history.  Her current breathing is unlabored.  Oral mucosa is exceedingly dry.  She has expected tenderness in area of her right hip.  She does not have any other areas of deformity or tenderness.  Per chart review, AKI and mild hyperkalemia were identified on lab work from yesterday.  Lab work and imaging studies were ordered today.  IV fluids were ordered for hydration.  Lab work shows evidence of multiorgan failure.  AKI is confirmed.  Transaminases are elevated concerning for shock liver.  Troponin and BNP are elevated.  She is found to have a UTI.  Cefepime  was ordered.  CT of head showed concern of subacute strokes.  I spoke with neurologist on-call, Dr. Voncile, who recommends MRI.  He will see in consult.  No acute findings were identified on CT of chest, abdomen, pelvis.  Patient's elevated troponin increased mildly on delta.  Cardiology was consulted.  I spoke with cardiology coordinator who states that cardiology will see in consult.  Patient was admitted for further management. I ordered medication including IV fluids for hydration, cefepime  for UTI Reevaluation of the patient after these medicines showed that the patient stayed the same I have reviewed the patients home medicines and have made adjustments as needed   Consultations Obtained:  I requested  consultation with the neurologist, Dr. Voncile,  and discussed lab and imaging findings as well as pertinent plan - they recommend: Will see in consult I requested consultation with the cardiology coordinator, Carley Bonds,  and discussed lab and imaging findings as well as pertinent plan - they recommend: Cardiology will see in consult   Social Determinants of Health:  Currently residing in a rehab facility  CRITICAL CARE Performed by: Bernardino Fireman   Total critical care time: 32 minutes  Critical care time was exclusive of separately billable procedures and treating other patients.  Critical care was necessary to treat or prevent imminent or life-threatening deterioration.  Critical care was time spent personally by me on the following activities: development of treatment plan with patient and/or surrogate as well as nursing, discussions with consultants, evaluation of patient's response to treatment, examination of patient, obtaining history from patient or surrogate, ordering and performing treatments and interventions, ordering and review of laboratory studies, ordering and review of radiographic studies, pulse oximetry and re-evaluation of patient's condition.      Final diagnoses:  Acute encephalopathy  AKI (acute kidney injury)  Elevated troponin  Urinary tract infection without hematuria, site unspecified  Abnormal transaminases    ED Discharge Orders     None          Fireman Bernardino, MD 11/03/23 1409

## 2023-11-03 NOTE — ED Notes (Signed)
Report given to Yellow RN 

## 2023-11-03 NOTE — Progress Notes (Signed)
 ANTICOAGULATION CONSULT NOTE  Pharmacy Consult for Heparin  Indication: stroke  Allergies  Allergen Reactions   Latex Other (See Comments), Rash and Hives    tears skin   Codeine Other (See Comments)    Abnormal behavior   Morphine  And Codeine Other (See Comments)    Affects BP and blood sugar.   Cyclobenzaprine Other (See Comments)    MADE PT SICK- pt unsure if med was cyclobenzaprine or methocarbamol     Patient Measurements:   Heparin  Dosing Weight: 69.1 kg  Vital Signs: Temp: 98 F (36.7 C) (10/27 1400) Temp Source: Oral (10/27 1400) BP: 116/54 (10/27 1400) Pulse Rate: 51 (10/27 1400)  Labs: Recent Labs    11/03/23 1104 11/03/23 1105 11/03/23 1115 11/03/23 1234 11/03/23 1618  HGB 9.2* 9.5* 9.1*  --   --   HCT 27.0* 28.0* 29.1*  --   --   PLT  --   --  282  --   --   CREATININE  --  2.40* 2.37*  --   --   TROPONINIHS  --   --  779* 841* 713*    Estimated Creatinine Clearance: 18.4 mL/min (A) (by C-G formula based on SCr of 2.37 mg/dL (H)).   Medical History: Past Medical History:  Diagnosis Date   Acute kidney injury 08/05/2016   Acute respiratory failure with hypoxia (HCC) 04/19/2020   Acute stroke due to ischemia [facility])    Aftercare following surgery of the circulatory system, NEC 05/11/2013   AKI (acute kidney injury) 08/04/2016   Anxiety    takes Celexa  daily   ARF (acute renal failure) 11/07/2016   Asymptomatic stenosis of right carotid artery 03/22/2016   Back pain    occasionally   Carotid artery disease 04/16/2013   Carotid artery occlusion    Carotid stenosis 11/16/2013   Cataract    left and immature   Coronary artery disease    Coronary atherosclerosis of native coronary artery 03/11/2013   S/p CABG in 1997    Depression    Diabetes mellitus    takes Metformin  and Glipizide  daily   Diabetes mellitus (HCC) 05/02/2015   Dizziness    takes Meclizine  daily as needed   Essential hypertension, benign 03/11/2013   GERD (gastroesophageal  reflux disease)    takes Omeprazole daily as needed   Headache(784.0)    Hyperlipidemia    takes Atorvastatin  daily   Hypertension    takes Carvedilol  daily   Mixed hyperlipidemia 03/11/2013   Muscle spasm    takes Robaxin  daily as needed   Nausea    takes Phenergan  daily as needed   Occlusion and stenosis of carotid artery without mention of cerebral infarction 07/19/2011   Pneumonia    hx of-in high school   Restless leg    takes Requip  daily as needed   Seasonal allergies    takes Claritin  daily as needed and Afrin as needed   Severe sepsis (HCC) 02/14/2021   Shortness of breath    with exertion   Urinary urgency    UTI (urinary tract infection) 11/07/2016   Assessment: 83 yof with a history of HTN, HLD, DM, CAD s/p CABG, CVA, CAD, CKD, GERD, RLS, HF, depression, anxiety, chronic respiratory failure. Patient is presenting with AMS. Heparin  per pharmacy consult placed for stroke protocol (Risk for DVT 2/2 recent hip surgery, NSTEMI) per neurology request.  Patient reportedly on VTE ppx dosed enoxaparin  prior to arrival. I have not been able to determine last dose through [facility] paperwork sent  from SNF.  Hgb 9.1; plt 282  Goal of Therapy:  Heparin  level 0.3-0.5 units/ml Monitor platelets by anticoagulation protocol: Yes   Plan:  No initial heparin  bolus Start heparin  infusion at 850 units/hr Check anti-Xa level in 8 hours and daily while on heparin  Continue to monitor H&H and platelets  Dorn Buttner, PharmD, BCPS 11/03/2023 5:54 PM ED Clinical Pharmacist -  [phone]

## 2023-11-03 NOTE — Consult Note (Addendum)
 Cardiology Consultation   Patient ID: Tajai Suder Vacca MRN: [id number]; DOB: 1940-08-07  Admit date: 11/03/2023 Date of Consult: 11/03/2023  PCP:  System, Provider Not In   [facility] Providers Cardiologist:  Lonni CROME Nanas, MD      Patient Profile: Ahmari Garton Charbonnet is a 83 y.o. female with a hx of hypertension, diabetes, carotid artery disease s/p left CAE in 2015, right CAE in 2018, diastolic heart failure, mild LVH, prior CVA, GERD, and CAD s/p CABG in 1997 who is being seen 11/03/2023 for the evaluation of elevated troponins at the request of Bernardino Fireman MD.  History of Present Illness: Ms. Matsen is an 83 year old female with prior cardiac history listed below.  Patient has a remote history of a stress test that was done back in 2012   Echo prior on 02/2021 and showed a low normal LVEF of 50 to 55%, mild concentric LVH, G3 DD, elevated LVEDP, normal RV function, severe pulmonary hypertension with a systolic pressure of 60.1 mmHg, severe left atrial dilation, moderate right atrial dilation, moderate MAC, mild MR, and moderate TR.   A carotid duplex was done on 01/2023 and showed 1 to 39% stenosis in the right ICA, and 40 to 59% stenosis in the left ICA.  Flow in the vertebral arteries was normal but flow was disturbed in the left subclavian.   Was seen by Rosaline Pavy on 01/2023 the patient was documented as needing oxygen  at her assisted living facility.  The patient also had some worsening lower extremity edema but does not appear to have any anginal symptoms at time and was on Brilinta  and aspirin .   On 10/22/2023 had a right hip fracture following a mechanical fall.  Orthopedic surgery saw the patient and surgically stabilized her hip.  The patient was discharged to a nursing facility. Echocardiogram on 10/22/23 showed an LVEF of greater than 75%, no RWMA, G2 DD, severely elevated pulmonary artery systolic pressure, moderately dilated left atrium, mildly dilated right  atrium, mild aortic stenosis, and borderline ascending aortic dilation.  Patient presented to the emergency department with altered mental status.  EMS reportedly spoke to the nursing facility and they reported that she was in her normal state of health and was alert and orientated 2 days ago.  She was walking on her recent hip repair.  She had this acute change in her mental status yesterday.   On interview patient was unable to answer any questions.  Labs showed high-sensitivity troponins 779> 841, potassium of 4.4, sodium of 141, AKI with creatinine of 2.37 baseline is 0.8, hypoalbuminemia of 2.9, elevated AST of 1001, elevated ALT of 437, anemia with a hemoglobin of 9.1, and WBC count of 9.1.  Respiratory panel was negative.  EKG showed normal sinus rhythm with a rate of 60, ST elevation in lead III and aVR.  ST depression and T wave inversions in lead I, aVL, and V2.  T wave inversions are new since prior EKG on 10/21/2023 but did appear to have some depression and elevation on prior EKG  Head CT showed multiple acute and subacute appearing infarcts that are suspected to be embolic in nature.  CT abdomen and pelvis showed no acute findings.  Chest x-ray showed Left basilar airspace opacities with a small left pleural effusion.   Past Medical History:  Diagnosis Date   Acute kidney injury 08/05/2016   Aftercare following surgery of the circulatory system, NEC 05/11/2013   AKI (acute kidney injury) 08/04/2016   Anxiety  takes Celexa  daily   ARF (acute renal failure) 11/07/2016   Asymptomatic stenosis of right carotid artery 03/22/2016   Back pain    occasionally   Carotid artery disease 04/16/2013   Carotid artery occlusion    Carotid stenosis 11/16/2013   Cataract    left and immature   Coronary artery disease    Coronary atherosclerosis of native coronary artery 03/11/2013   S/p CABG in 1997    Depression    Diabetes mellitus    takes Metformin  and Glipizide  daily   Diabetes  mellitus (HCC) 05/02/2015   Dizziness    takes Meclizine  daily as needed   Essential hypertension, benign 03/11/2013   GERD (gastroesophageal reflux disease)    takes Omeprazole daily as needed   Headache(784.0)    Hyperlipidemia    takes Atorvastatin  daily   Hypertension    takes Carvedilol  daily   Mixed hyperlipidemia 03/11/2013   Muscle spasm    takes Robaxin  daily as needed   Nausea    takes Phenergan  daily as needed   Occlusion and stenosis of carotid artery without mention of cerebral infarction 07/19/2011   Pneumonia    hx of-in high school   Restless leg    takes Requip  daily as needed   Seasonal allergies    takes Claritin  daily as needed and Afrin as needed   Shortness of breath    with exertion   Urinary urgency    UTI (urinary tract infection) 11/07/2016    Past Surgical History:  Procedure Laterality Date   COLONOSCOPY WITH PROPOFOL  N/A 03/10/2012   Procedure: COLONOSCOPY WITH PROPOFOL ;  Surgeon: Gladis MARLA Louder, MD;  Location: [facility];  Service: Endoscopy;  Laterality: N/A;   CORNEAL TRANSPLANT Right    CORONARY ARTERY BYPASS GRAFT  1997   x 6   CORONARY ARTERY BYPASS GRAFT  Jan. 1997   ENDARTERECTOMY Left 04/16/2013   Procedure: Left Carotid Artery Endatarectomy with Resection of Redundant Internal Carotid Artery;  Surgeon: Krystal JULIANNA Doing, MD;  Location: [facility] OR;  Service: Vascular;  Laterality: Left;   ENDARTERECTOMY Right 03/22/2016   Procedure: RIGHT ENDARTERECTOMY CAROTID;  Surgeon: Krystal JULIANNA Doing, MD;  Location: [facility] OR;  Service: Vascular;  Laterality: Right;   ESOPHAGOGASTRODUODENOSCOPY N/A 03/10/2012   Procedure: ESOPHAGOGASTRODUODENOSCOPY (EGD);  Surgeon: Gladis MARLA Louder, MD;  Location: [facility];  Service: Endoscopy;  Laterality: N/A;   EYE SURGERY  March 12, 2001   CORNEA TRANSPLANT Right eye   INTRAMEDULLARY (IM) NAIL INTERTROCHANTERIC Right 10/22/2023   Procedure: FIXATION, FRACTURE, INTERTROCHANTERIC, WITH INTRAMEDULLARY ROD;  Surgeon: Reyne Cordella SQUIBB, MD;  Location: [facility] OR;  Service: Orthopedics;  Laterality: Right;   PATCH ANGIOPLASTY Right 03/22/2016   Procedure: PATCH ANGIOPLASTY;  Surgeon: Krystal JULIANNA Doing, MD;  Location: [facility] OR;  Service: Vascular;  Laterality: Right;   PR VEIN BYPASS GRAFT,AORTO-FEM-POP  1997   SPINE SURGERY  march 2013   Back surgery   TONSILLECTOMY     TRIGGER FINGER RELEASE Left    thumb     Home Medications:  Prior to Admission medications   Medication Sig Start Date End Date Taking? Authorizing Provider  acetaminophen  (TYLENOL ) 500 MG tablet Take 1,000 mg by mouth at bedtime.    [provider]  amLODipine  (NORVASC ) 10 MG tablet Take 10 mg by mouth daily.    [provider]  aspirin  EC 81 MG tablet Take 81 mg by mouth every evening.     [provider]  busPIRone  (BUSPAR ) 10 MG  tablet Take 10 mg by mouth 3 (three) times daily. 01/25/23   [provider]  calcium  carbonate (TUMS - DOSED IN MG ELEMENTAL CALCIUM ) 500 MG chewable tablet Chew 1 tablet by mouth 3 (three) times daily before meals.    [provider]  Carboxymethylcellulose Sodium (REFRESH TEARS OP) Place 1 drop into both eyes every 4 (four) hours as needed (for dry eye).    [provider]  carvedilol  (COREG ) 6.25 MG tablet Take 1 tablet (6.25 mg total) by mouth 2 (two) times daily. 10/27/23   Perri DELENA Meliton Mickey., MD  chlorhexidine  (HIBICLENS ) 4 % external liquid Apply 15 mLs (1 Application total) topically as directed for 30 doses. Use as directed daily for 5 days every other week for 6 weeks. 10/22/23   Reyne Cordella SQUIBB, MD  citalopram  (CELEXA ) 20 MG tablet Take 20 mg by mouth daily.    [provider]  cyanocobalamin  1000 MCG tablet Take 1 tablet (1,000 mcg total) by mouth daily. 10/28/23 11/27/23  Perri DELENA Meliton Mickey., MD  diphenhydrAMINE -zinc acetate (BENADRYL ) cream Apply 1 Application topically daily. Apply to both arms every day shift for itching    [provider]   docusate sodium  (COLACE) 100 MG capsule Take 100 mg by mouth 2 (two) times daily.    [provider]  empagliflozin (JARDIANCE) 25 MG TABS tablet Take 25 mg by mouth daily.    [provider]  enoxaparin  (LOVENOX ) 40 MG/0.4ML injection Inject 0.4 mLs (40 mg total) into the skin daily. 10/28/23 11/27/23  Perri DELENA Meliton Mickey., MD  estradiol (ESTRACE) 0.1 MG/GM vaginal cream Place 1 Applicatorful vaginally at bedtime. Twice daily for pain    [provider]  ferrous sulfate  325 (65 FE) MG tablet Take 325 mg by mouth daily. Monday-Friday    [provider]  fluticasone (CUTIVATE) 0.005 % ointment Apply 1 Application topically in the morning, at noon, and at bedtime. Apply to forearms and legs every shift    [provider]  furosemide  (LASIX ) 20 MG tablet Take 1 tablet (20 mg total) by mouth daily as needed for fluid or edema. take once daily as needed for weight gain of 2-3 lbs overnight or 5 lbs in 1 week. 10/27/23   Perri DELENA Meliton Mickey., MD  gabapentin  (NEURONTIN ) 300 MG capsule Take 300 mg by mouth 3 (three) times daily. 01/25/23   [provider]  glipiZIDE  (GLUCOTROL  XL) 5 MG 24 hr tablet Take 1 tablet (5 mg total) by mouth daily with breakfast. 04/21/20   Milissa Tod PARAS, MD  hyoscyamine  (LEVSIN  SL) 0.125 MG SL tablet Place 1 tablet (0.125 mg total) under the tongue 3 (three) times daily with meals as needed. Patient taking differently: Place 0.125 mg under the tongue 3 (three) times daily before meals. 07/30/23   May, Deanna J, NP  levocetirizine (XYZAL) 5 MG tablet Take 5 mg by mouth every evening.    [provider]  magnesium  oxide (MAG-OX) 400 (240 Mg) MG tablet Take 400 mg by mouth at bedtime.    [provider]  meclizine  (ANTIVERT ) 25 MG tablet Take 25 mg by mouth 3 (three) times daily.    [provider]  melatonin 3 MG TABS tablet Take 3 mg by mouth at bedtime.    [provider]  methocarbamol   (ROBAXIN ) 750 MG tablet Take 750 mg by mouth 2 (two) times daily. 10/31/22   [provider]  mupirocin  ointment (BACTROBAN ) 2 % Place 1 Application into  the nose 2 (two) times daily for 60 doses. Use as directed 2 times daily for 5 days every other week for 6 weeks. 10/22/23 11/21/23  Reyne Cordella SQUIBB, MD  nystatin powder Apply 1 Application topically 2 (two) times daily. Apply under breasts    [provider]  omeprazole (PRILOSEC) 40 MG capsule Take 40 mg by mouth 2 (two) times daily. 10/03/23   [provider]  oxybutynin  (DITROPAN -XL) 10 MG 24 hr tablet Take 10 mg by mouth daily.    [provider]  OXYGEN  Inhale 2 L/min into the lungs continuous.    [provider]  pantoprazole  (PROTONIX ) 40 MG tablet Take 40 mg by mouth daily. Patient not taking: Reported on 10/22/2023    [provider]  rosuvastatin  (CRESTOR ) 10 MG tablet Take 10 mg by mouth daily.    [provider]  saccharomyces boulardii (FLORASTOR) 250 MG capsule Take 250 mg by mouth daily.    [provider]  SALINE NASAL SPRAY NA Place 1 spray into alternate nostrils in the morning and at bedtime.    [provider]  senna (SENOKOT) 8.6 MG tablet Take 2 tablets by mouth at bedtime.    [provider]  sodium zirconium cyclosilicate  (LOKELMA ) 10 g PACK packet Take 10 g by mouth daily.    [provider]  valsartan (DIOVAN) 80 MG tablet Take 80 mg by mouth daily. 12/03/21   [provider]    Scheduled Meds:  Continuous Infusions:  ceFEPime  (MAXIPIME ) IV     PRN Meds:   Allergies:    Allergies  Allergen Reactions   Latex Other (See Comments), Rash and Hives    tears skin   Codeine Other (See Comments)    Abnormal behavior   Morphine  And Codeine Other (See Comments)    Affects BP and blood sugar.   Cyclobenzaprine Other (See Comments)    MADE PT SICK- pt unsure if med was cyclobenzaprine or methocarbamol      Social History:   Social History   Socioeconomic History   Marital status: Widowed    Spouse name: Not on file   Number of children: Not on file   Years of education: Not on file   Highest education level: Not on file  Occupational History   Not on file  Tobacco Use   Smoking status: Never   Smokeless tobacco: Never  Vaping Use   Vaping status: Never Used  Substance and Sexual Activity   Alcohol  use: No    Alcohol /week: 0.0 standard drinks of alcohol    Drug use: No   Sexual activity: Never    Birth control/protection: Post-menopausal  Other Topics Concern   Not on file  Social History Narrative   Not on file   Social Drivers of Health   Financial Resource Strain: Not on file  Food Insecurity: No Food Insecurity (10/22/2023)   Hunger Vital Sign    Worried About Running Out of Food in the Last Year: Never true    Ran Out of Food in the Last Year: Never true  Transportation Needs: No Transportation Needs (10/22/2023)   PRAPARE - Administrator, Civil Service (Medical): No    Lack of Transportation (Non-Medical): No  Physical Activity: Not on file  Stress: Not on file  Social Connections: Moderately Integrated (10/22/2023)   Social Connection and Isolation Panel    Frequency of Communication with Friends and Family: More than three times a week    Frequency of Social  Gatherings with Friends and Family: More than three times a week    Attends Religious Services: More than 4 times per year    Active Member of Clubs or Organizations: Yes    Attends Banker Meetings: 1 to 4 times per year    Marital Status: Widowed  Intimate Partner Violence: Not At Risk (10/22/2023)   Humiliation, Afraid, Rape, and Kick questionnaire    Fear of Current or Ex-Partner: No    Emotionally Abused: No    Physically Abused: No    Sexually Abused: No    Family History:    Family History  Problem Relation Age of Onset   Heart attack Father    Heart disease  Father 54       Before age of 59   Hyperlipidemia Father    Heart disease Brother        Heart dissease before age 49   Hyperlipidemia Brother    Cirrhosis Mother    Heart attack Paternal Grandfather    Heart disease Paternal Grandfather    Cancer Maternal Grandmother    Alcoholism Maternal Grandfather    Heart attack Paternal Grandmother      ROS:  Please see the history of present illness.   All other ROS reviewed and negative.     Physical Exam/Data: Vitals:   11/03/23 1023  BP: (!) 106/55  Pulse: 64  Resp: 16  Temp: 97.7 F (36.5 C)  TempSrc: Oral  SpO2: 100%   No intake or output data in the 24 hours ending 11/03/23 1358    10/21/2023    8:39 PM 10/21/2023    8:13 PM 07/14/2023    1:09 PM  Last 3 Weights  Weight (lbs) 185 lb 6.5 oz 178 lb 179 lb 8 oz  Weight (kg) 84.1 kg 80.74 kg 81.421 kg     There is no height or weight on file to calculate BMI.  General:  Well nourished, well developed, unable to answer any questions due to confusion HEENT: normal Neck: Positive JVD Vascular: No carotid bruits; Distal pulses 2+ bilaterally Cardiac:  normal S1, S2; RRR; no murmur  Lungs: Bibasilar crackles, no wheezing, rhonchi or rales  Abd: soft, nontender, no hepatomegaly  Ext: no edema Musculoskeletal:  No deformities Skin: warm and dry  Neuro:   no focal abnormalities noted Psych:  Normal affect   EKG:  The EKG was personally reviewed and demonstrates:  normal sinus rhythm with a rate of 60, ST elevation in lead III and aVR.  ST depression and T wave inversions in lead I, aVL, and V2.  T wave inversions are new since prior EKG on 10/21/2023 but did appear to have some depression and elevation on prior EKG Telemetry:  Telemetry was personally reviewed and demonstrates: Normal sinus rhythm with heart rates in the 50s to 70s.   Relevant CV Studies: Echo pending  Laboratory Data: High Sensitivity Troponin:   Recent Labs  Lab 10/21/23 2024 11/03/23 1115  11/03/23 1234  TROPONINIHS 7 779* 841*     Chemistry Recent Labs  Lab 11/03/23 1104 11/03/23 1105 11/03/23 1115  NA 140 140 141  K 4.4 4.4 4.4  CL  --  105 103  CO2  --   --  21*  GLUCOSE  --  141* 143*  BUN  --  72* 74*  CREATININE  --  2.40* 2.37*  CALCIUM   --   --  8.9  GFRNONAA  --   --  20*  ANIONGAP  --   --  17*    Recent Labs  Lab 11/03/23 1115  PROT 7.2  ALBUMIN 2.9*  AST 1,001*  ALT 437*  ALKPHOS 98  BILITOT 1.6*   Lipids No results for input(s): CHOL, TRIG, HDL, LABVLDL, LDLCALC, CHOLHDL in the last 168 hours.  Hematology Recent Labs  Lab 11/03/23 1104 11/03/23 1105 11/03/23 1115  WBC  --   --  9.1  RBC  --   --  2.66*  HGB 9.2* 9.5* 9.1*  HCT 27.0* 28.0* 29.1*  MCV  --   --  109.4*  MCH  --   --  34.2*  MCHC  --   --  31.3  RDW  --   --  16.4*  PLT  --   --  282   Thyroid  No results for input(s): TSH, FREET4 in the last 168 hours.  BNP Recent Labs  Lab 11/03/23 1116  BNP 673.1*    DDimer No results for input(s): DDIMER in the last 168 hours.  Radiology/Studies:  CT CHEST ABDOMEN PELVIS WO CONTRAST Result Date: 11/03/2023 CLINICAL DATA:  Altered mental status.  Sepsis suspected. EXAM: CT CHEST, ABDOMEN AND PELVIS WITHOUT CONTRAST TECHNIQUE: Multidetector CT imaging of the chest, abdomen and pelvis was performed following the standard protocol without IV contrast. RADIATION DOSE REDUCTION: This exam was performed according to the departmental dose-optimization program which includes automated exposure control, adjustment of the mA and/or kV according to patient size and/or use of iterative reconstruction technique. COMPARISON:  CT of the chest, abdomen and pelvis 10/21/2023. Abdominopelvic CT 05/28/2022. FINDINGS: CT CHEST FINDINGS Cardiovascular: Atherosclerosis of the aorta, great vessels and coronary arteries status post median sternotomy and CABG. The heart is mildly enlarged. No significant pericardial fluid.  Mediastinum/Nodes: There are no enlarged mediastinal, hilar or axillary lymph nodes. Small hiatal hernia again noted. The thyroid  gland appears unremarkable. Lungs/Pleura: Dependent pleural thickening bilaterally without significant pleural fluid. No pneumothorax. Dependent airspace opacities at both lung bases, similar to recent prior CT, likely atelectasis. No confluent airspace disease. Musculoskeletal/Chest wall: No chest wall mass or suspicious osseous findings. Healed median sternotomy. Multilevel spondylosis associated with a mild convex right thoracic scoliosis. CT ABDOMEN AND PELVIS FINDINGS Hepatobiliary: No acute or focal abnormalities are identified on noncontrast imaging. Mild contour irregularity of the liver suggested, potentially due to mild underlying cirrhosis. No evidence of gallstones, gallbladder wall thickening or biliary dilatation. Pancreas: Unremarkable. No pancreatic ductal dilatation or surrounding inflammatory changes. Spleen: Normal in size without focal abnormality. Adrenals/Urinary Tract: Both adrenal glands appear normal. The kidneys appears stable, without evidence of urinary tract calculus or hydronephrosis. No suspicious renal lesions are identified on noncontrast imaging. A cyst projecting posteriorly from the lower pole of the left kidney appears unchanged, measuring 3.1 cm on image 73/5. No specific follow-up imaging recommended. The bladder appears normal for its degree of distention. Stomach/Bowel: No enteric contrast administered. The stomach appears unremarkable for its degree of distension. No evidence of bowel wall thickening, distention or surrounding inflammatory change. Mild colonic diverticulosis. Vascular/Lymphatic: There are no enlarged abdominal or pelvic lymph nodes. Aortic and branch vessel atherosclerosis without evidence of aneurysm. Reproductive: The uterus and ovaries appear unremarkable. No adnexal mass. Other: No evidence of abdominal wall mass or hernia. No  ascites or pneumoperitoneum. Musculoskeletal: Interval proximal right femoral ORIF with improved alignment of the previously demonstrated intertrochanteric fracture. There are small hematomas within the lateral subcutaneous fat, measuring up to 4.3 cm in diameter. No evidence of pelvic hematoma. No new fractures are identified. There are  degenerative and postsurgical changes in the lower lumbar spine. IMPRESSION: 1. No acute findings or clear explanation for the patient's symptoms. 2. Interval proximal right femoral ORIF with improved alignment of the previously demonstrated intertrochanteric fracture. There are small hematomas within the lateral subcutaneous fat. 3. Dependent airspace opacities at both lung bases, similar to recent prior CT, likely atelectasis. 4. Mild contour irregularity of the liver, potentially due to mild underlying cirrhosis. 5.  Aortic Atherosclerosis (ICD10-I70.0). Electronically Signed   By: Elsie Perone M.D.   On: 11/03/2023 12:07   CT Head Wo Contrast Result Date: 11/03/2023 EXAM: CT HEAD WITHOUT CONTRAST 11/03/2023 11:29:35 AM TECHNIQUE: CT of the head was performed without the administration of intravenous contrast. Automated exposure control, iterative reconstruction, and/or weight based adjustment of the mA/kV was utilized to reduce the radiation dose to as low as reasonably achievable. COMPARISON: Brain MRI 08/28/2022. Head CT 10/21/2023. CLINICAL HISTORY: 83 year old female. Mental status change, unknown cause. Noted confusion, saying words that don't make sense. Thought to be hyperkalemic. FINDINGS: BRAIN AND VENTRICLES: No acute hemorrhage. Brain volume remains normal for age. Multiple acute to subacute appearing infarcts are present in both cerebral hemispheres and the left cerebellum (largest). Specifically, there is a small new 2 cm area of cytotoxic edema in the anterior left middle frontal gyrus, anterior left MCA territory (series 3 image 12), with no regional mass  effect. A larger area of confluent cytotoxic edema is seen in the left cerebellum, left PICA or AICA territory (series 3 image 25). Additionally, there is a small cortical infarct in the right lateral occipital lobe, approximately 1 cm on series 3 image 18. These findings are new from the recent head CT of 10/21/2023. No hemorrhagic transformation. No significant intracranial mass effect. No ventriculomegaly. No hydrocephalus. No extra-axial collection. No midline shift. Calcified atherosclerosis at the skull base. No suspicious intracranial vascular hyperdensity. The pattern of these findings suggests sequelae of a recent embolic event. ORBITS: No acute abnormality. SINUSES: No acute abnormality. SOFT TISSUES AND SKULL: No acute soft tissue abnormality. No skull fracture. IMPRESSION: 1. Multiple acute to subacute appearing infarcts in both cerebral hemispheres and the left cerebellum (largest). Pattern suggests a recent embolic event. 2. No hemorrhagic transformation or intracranial mass effect. Electronically signed by: Helayne Hurst MD 11/03/2023 11:39 AM EDT RP Workstation: [id number]   DG Chest Port 1 View Result Date: 11/03/2023 EXAM: 1 VIEW(S) XRAY OF THE CHEST 11/03/2023 10:37:00 AM COMPARISON: None available. CLINICAL HISTORY: Questionable sepsis - evaluate for abnormality. FINDINGS: LUNGS AND PLEURA: Low lung volumes. Left basilar airspace opacities. Small left pleural effusion. HEART AND MEDIASTINUM: Stable Cardiomegaly. Sternotomy wires and mediastinal surgical clips suggesting CABG. BONES AND SOFT TISSUES: No acute osseous abnormality. IMPRESSION: 1. Left basilar airspace opacities with a small left pleural effusion. Electronically signed by: Oneil Devonshire MD 11/03/2023 11:11 AM EDT RP Workstation: [id number]     Assessment and Plan:  Flavia CROME Baggerly is a 83 y.o. female with a hx of hypertension, diabetes, carotid artery disease s/p left CAE in 2015, right CAE in 2018, diastolic heart failure, mild  LVH, prior CVA, GERD, and CAD s/p CABG in 1997 unclear which grafts patient received who is being seen 11/03/2023 for the evaluation of elevated troponins at the request of Bernardino Fireman MD.  New onset altered mental status Acute CVA On 10/22/2023 had a right hip fracture following a mechanical fall.  Orthopedic surgery saw the patient and surgically stabilized her hip.  The patient was discharged to a nursing facility.  Patient presented to the emergency department with altered mental status.  EMS reportedly spoke to the nursing facility and they reported that she was in her normal state of health and was alert and orientated 2 days ago.  She started to have this decline in her mental status yesterday. Head CT showed multiple acute and subacute appearing infarcts that are suspected to be embolic in nature. Management per neurology   Elevated troponins 779> 841 CAD s/p CABG in 1997 Severe Pulmonary hypertension Hyperlipidemia Echocardiogram on 10/22/2023 showed an LVEF of greater than 75%, no RWMA, G2 DD, severely elevated pulmonary artery systolic pressure, moderately dilated left atrium, mildly dilated right atrium, mild aortic stenosis, and borderline ascending aortic dilation. EKG shows normal sinus rhythm with a rate of 60, ST elevation in lead III and aVR.  ST depression and T wave inversions in lead I, aVL, and V2.  T wave inversions are new since prior EKG on 10/21/2023 but did appear to have some depression and elevation on prior EKG. Echo pending   Suspected heart failure BNP was elevated at 673.1. Chest x-ray showed Left basilar airspace opacities with a small left pleural effusion.  On exam had positive JVD and crackles. Echo pending   AKI AKI with creatinine of 2.37 baseline is 0.8.   Elevated LFTs elevated AST of 1001, elevated ALT of 437.    Otherwise management per primary   Risk Assessment/Risk Scores:     [place]  Heart Association (NYHA) Functional Class NYHA  Class II       For questions or updates, please contact [facility] Please consult [email or web address] for contact info under     Signed, Morse Clause, PA-C  11/03/2023 1:58 PM

## 2023-11-03 NOTE — Consult Note (Signed)
 NEUROLOGY CONSULT NOTE   Date of service: November 03, 2023 Patient Name: Andrea Kirk MRN:  [id number] DOB:  1940/06/01 Chief Complaint: Altered mental status Requesting Provider: Seena Marsa NOVAK, MD  History of Present Illness  Andrea Kirk is a 83 y.o. female with hx of hypertension, diabetes, carotid artery disease s/p left CAE in 2015, right CAE in 2018, diastolic heart failure, mild LVH, prior CVA, GERD, and CAD s/p CABG in 1997, recent hip fracture with discharge to SNF on 10/20.  She is s/p IM rod to repair her right hip fracture. Her ASA and brilinta  was stopped for the procedure and she was discharged on full dose lovenox  for DVT ppx. She did require 2uPRBC during that admission as well. Staff noted an acute mental status change yesterday and her lab worked showed an AKI. Per chart she is Aox4 at baseline.   NIHSS components Score: Comment  1a Level of Conscious 0[x]  1[]  2[]  3[]      1b LOC Questions 0[]  1[]  2[x]       1c LOC Commands 0[x]  1[]  2[]       2 Best Gaze 0[x]  1[]  2[]       3 Visual 0[x]  1[]  2[]  3[]      4 Facial Palsy 0[x]  1[]  2[]  3[]      5a Motor Arm - left 0[x]  1[]  2[]  3[]  4[]  UN[]    5b Motor Arm - Right 0[x]  1[]  2[]  3[]  4[]  UN[]    6a Motor Leg - Left 0[]  1[]  2[x]  3[]  4[]  UN[]   Endorses pain, limited ROM  6b Motor Leg - Right 0[]  1[]  2[]  3[x]  4[]  UN[]  Endorses pain, limited ROM  7 Limb Ataxia 0[x]  1[]  2[]  UN[]      8 Sensory 0[x]  1[]  2[]  UN[]      9 Best Language 0[]  1[x]  2[]  3[]      10 Dysarthria 0[x]  1[]  2[]  UN[]      11 Extinct. and Inattention 0[x]  1[]  2[]       TOTAL:       ROS   Unable to ascertain due to altered mental status  Past History   Past Medical History:  Diagnosis Date   Acute kidney injury 08/05/2016   Acute respiratory failure with hypoxia (HCC) 04/19/2020   Acute stroke due to ischemia [facility])    Aftercare following surgery of the circulatory system, NEC 05/11/2013   AKI (acute kidney injury) 08/04/2016   Anxiety    takes Celexa  daily    ARF (acute renal failure) 11/07/2016   Asymptomatic stenosis of right carotid artery 03/22/2016   Back pain    occasionally   Carotid artery disease 04/16/2013   Carotid artery occlusion    Carotid stenosis 11/16/2013   Cataract    left and immature   Coronary artery disease    Coronary atherosclerosis of native coronary artery 03/11/2013   S/p CABG in 1997    Depression    Diabetes mellitus    takes Metformin  and Glipizide  daily   Diabetes mellitus (HCC) 05/02/2015   Dizziness    takes Meclizine  daily as needed   Essential hypertension, benign 03/11/2013   GERD (gastroesophageal reflux disease)    takes Omeprazole daily as needed   Headache(784.0)    Hyperlipidemia    takes Atorvastatin  daily   Hypertension    takes Carvedilol  daily   Mixed hyperlipidemia 03/11/2013   Muscle spasm    takes Robaxin  daily as needed   Nausea    takes Phenergan  daily as needed   Occlusion and stenosis of carotid artery  without mention of cerebral infarction 07/19/2011   Pneumonia    hx of-in high school   Restless leg    takes Requip  daily as needed   Seasonal allergies    takes Claritin  daily as needed and Afrin as needed   Severe sepsis (HCC) 02/14/2021   Shortness of breath    with exertion   Urinary urgency    UTI (urinary tract infection) 11/07/2016    Past Surgical History:  Procedure Laterality Date   COLONOSCOPY WITH PROPOFOL  N/A 03/10/2012   Procedure: COLONOSCOPY WITH PROPOFOL ;  Surgeon: Gladis MARLA Louder, MD;  Location: [facility];  Service: Endoscopy;  Laterality: N/A;   CORNEAL TRANSPLANT Right    CORONARY ARTERY BYPASS GRAFT  1997   x 6   CORONARY ARTERY BYPASS GRAFT  Jan. 1997   ENDARTERECTOMY Left 04/16/2013   Procedure: Left Carotid Artery Endatarectomy with Resection of Redundant Internal Carotid Artery;  Surgeon: Krystal JULIANNA Doing, MD;  Location: [facility] OR;  Service: Vascular;  Laterality: Left;   ENDARTERECTOMY Right 03/22/2016   Procedure: RIGHT ENDARTERECTOMY CAROTID;   Surgeon: Krystal JULIANNA Doing, MD;  Location: [facility] OR;  Service: Vascular;  Laterality: Right;   ESOPHAGOGASTRODUODENOSCOPY N/A 03/10/2012   Procedure: ESOPHAGOGASTRODUODENOSCOPY (EGD);  Surgeon: Gladis MARLA Louder, MD;  Location: [facility];  Service: Endoscopy;  Laterality: N/A;   EYE SURGERY  March 12, 2001   CORNEA TRANSPLANT Right eye   INTRAMEDULLARY (IM) NAIL INTERTROCHANTERIC Right 10/22/2023   Procedure: FIXATION, FRACTURE, INTERTROCHANTERIC, WITH INTRAMEDULLARY ROD;  Surgeon: Reyne Cordella SQUIBB, MD;  Location: [facility] OR;  Service: Orthopedics;  Laterality: Right;   PATCH ANGIOPLASTY Right 03/22/2016   Procedure: PATCH ANGIOPLASTY;  Surgeon: Krystal JULIANNA Doing, MD;  Location: [facility] OR;  Service: Vascular;  Laterality: Right;   PR VEIN BYPASS GRAFT,AORTO-FEM-POP  1997   SPINE SURGERY  march 2013   Back surgery   TONSILLECTOMY     TRIGGER FINGER RELEASE Left    thumb    Family History: Family History  Problem Relation Age of Onset   Heart attack Father    Heart disease Father 61       Before age of 62   Hyperlipidemia Father    Heart disease Brother        Heart dissease before age 45   Hyperlipidemia Brother    Cirrhosis Mother    Heart attack Paternal Grandfather    Heart disease Paternal Grandfather    Cancer Maternal Grandmother    Alcoholism Maternal Grandfather    Heart attack Paternal Grandmother     Social History  reports that she has never smoked. She has never used smokeless tobacco. She reports that she does not drink alcohol  and does not use drugs.  Allergies  Allergen Reactions   Latex Other (See Comments), Rash and Hives    tears skin   Codeine Other (See Comments)    Abnormal behavior   Morphine  And Codeine Other (See Comments)    Affects BP and blood sugar.   Cyclobenzaprine Other (See Comments)    MADE PT SICK- pt unsure if med was cyclobenzaprine or methocarbamol     Medications   Current Facility-Administered Medications:    [START ON 11/04/2023]  stroke: early  stages of recovery book, , Does not apply, Once, Seena Marsa NOVAK, MD   0.9 %  sodium chloride  infusion, , Intravenous, Continuous, Melvin, Alexander B, MD   0.9 %  sodium chloride  infusion, , Intravenous, Continuous, Melvin, Alexander B, MD   acetaminophen  (TYLENOL ) tablet 650  mg, 650 mg, Oral, Q4H PRN **OR** acetaminophen  (TYLENOL ) 160 MG/5ML solution 650 mg, 650 mg, Per Tube, Q4H PRN **OR** acetaminophen  (TYLENOL ) suppository 650 mg, 650 mg, Rectal, Q4H PRN, Melvin, Alexander B, MD   aspirin  EC tablet 81 mg, 81 mg, Oral, QPM, Melvin, Alexander B, MD   cefTRIAXone  (ROCEPHIN ) 2 g in sodium chloride  0.9 % 100 mL IVPB, 2 g, Intravenous, Q24H, Melvin, Alexander B, MD   gabapentin  (NEURONTIN ) capsule 300 mg, 300 mg, Oral, TID, Melvin, Alexander B, MD   [START ON 11/04/2023] oxybutynin  (DITROPAN -XL) 24 hr tablet 10 mg, 10 mg, Oral, Daily, Melvin, Alexander B, MD   pantoprazole  (PROTONIX ) EC tablet 40 mg, 40 mg, Oral, Daily, Melvin, Alexander B, MD   [START ON 11/04/2023] rosuvastatin  (CRESTOR ) tablet 10 mg, 10 mg, Oral, Daily, Melvin, Alexander B, MD   senna-docusate (Senokot-S) tablet 1 tablet, 1 tablet, Oral, QHS PRN, Melvin, Alexander B, MD  Current Outpatient Medications:    acetaminophen  (TYLENOL ) 500 MG tablet, Take 1,000 mg by mouth at bedtime., Disp: , Rfl:    amLODipine  (NORVASC ) 10 MG tablet, Take 10 mg by mouth daily., Disp: , Rfl:    aspirin  EC 81 MG tablet, Take 81 mg by mouth every evening. , Disp: , Rfl:    busPIRone  (BUSPAR ) 10 MG tablet, Take 10 mg by mouth 3 (three) times daily., Disp: , Rfl:    calcium  carbonate (TUMS - DOSED IN MG ELEMENTAL CALCIUM ) 500 MG chewable tablet, Chew 1 tablet by mouth 3 (three) times daily before meals., Disp: , Rfl:    Carboxymethylcellulose Sodium (REFRESH TEARS OP), Place 1 drop into both eyes every 4 (four) hours as needed (for dry eye)., Disp: , Rfl:    carvedilol  (COREG ) 6.25 MG tablet, Take 1 tablet (6.25 mg total) by mouth 2 (two) times  daily., Disp: , Rfl:    chlorhexidine  (HIBICLENS ) 4 % external liquid, Apply 15 mLs (1 Application total) topically as directed for 30 doses. Use as directed daily for 5 days every other week for 6 weeks., Disp: 946 mL, Rfl: 1   citalopram  (CELEXA ) 20 MG tablet, Take 20 mg by mouth daily., Disp: , Rfl:    cyanocobalamin  1000 MCG tablet, Take 1 tablet (1,000 mcg total) by mouth daily., Disp: , Rfl:    diphenhydrAMINE -zinc acetate (BENADRYL ) cream, Apply 1 Application topically daily. Apply to both arms every day shift for itching, Disp: , Rfl:    docusate sodium  (COLACE) 100 MG capsule, Take 100 mg by mouth 2 (two) times daily., Disp: , Rfl:    empagliflozin (JARDIANCE) 25 MG TABS tablet, Take 25 mg by mouth daily., Disp: , Rfl:    enoxaparin  (LOVENOX ) 40 MG/0.4ML injection, Inject 0.4 mLs (40 mg total) into the skin daily., Disp: 12 mL, Rfl: 0   estradiol (ESTRACE) 0.1 MG/GM vaginal cream, Place 1 Applicatorful vaginally at bedtime. Twice daily for pain, Disp: , Rfl:    ferrous sulfate  325 (65 FE) MG tablet, Take 325 mg by mouth daily. Monday-Friday, Disp: , Rfl:    fluticasone (CUTIVATE) 0.005 % ointment, Apply 1 Application topically in the morning, at noon, and at bedtime. Apply to forearms and legs every shift, Disp: , Rfl:    furosemide  (LASIX ) 20 MG tablet, Take 1 tablet (20 mg total) by mouth daily as needed for fluid or edema. take once daily as needed for weight gain of 2-3 lbs overnight or 5 lbs in 1 week., Disp: 90 tablet, Rfl: 3   gabapentin  (NEURONTIN ) 300 MG capsule, Take  300 mg by mouth 3 (three) times daily., Disp: , Rfl:    glipiZIDE  (GLUCOTROL  XL) 5 MG 24 hr tablet, Take 1 tablet (5 mg total) by mouth daily with breakfast., Disp: 30 tablet, Rfl: 0   hyoscyamine  (LEVSIN  SL) 0.125 MG SL tablet, Place 1 tablet (0.125 mg total) under the tongue 3 (three) times daily with meals as needed. (Patient taking differently: Place 0.125 mg under the tongue 3 (three) times daily before meals.),  Disp: 90 tablet, Rfl: 2   levocetirizine (XYZAL) 5 MG tablet, Take 5 mg by mouth every evening., Disp: , Rfl:    magnesium  oxide (MAG-OX) 400 (240 Mg) MG tablet, Take 400 mg by mouth at bedtime., Disp: , Rfl:    meclizine  (ANTIVERT ) 25 MG tablet, Take 25 mg by mouth 3 (three) times daily., Disp: , Rfl:    melatonin 3 MG TABS tablet, Take 3 mg by mouth at bedtime., Disp: , Rfl:    methocarbamol  (ROBAXIN ) 750 MG tablet, Take 750 mg by mouth 2 (two) times daily., Disp: , Rfl:    mupirocin  ointment (BACTROBAN ) 2 %, Place 1 Application into the nose 2 (two) times daily for 60 doses. Use as directed 2 times daily for 5 days every other week for 6 weeks., Disp: 60 g, Rfl: 0   nystatin powder, Apply 1 Application topically 2 (two) times daily. Apply under breasts, Disp: , Rfl:    omeprazole (PRILOSEC) 40 MG capsule, Take 40 mg by mouth 2 (two) times daily., Disp: , Rfl:    oxybutynin  (DITROPAN -XL) 10 MG 24 hr tablet, Take 10 mg by mouth daily., Disp: , Rfl:    OXYGEN , Inhale 2 L/min into the lungs continuous., Disp: , Rfl:    pantoprazole  (PROTONIX ) 40 MG tablet, Take 40 mg by mouth daily. (Patient not taking: Reported on 10/22/2023), Disp: , Rfl:    rosuvastatin  (CRESTOR ) 10 MG tablet, Take 10 mg by mouth daily., Disp: , Rfl:    saccharomyces boulardii (FLORASTOR) 250 MG capsule, Take 250 mg by mouth daily., Disp: , Rfl:    SALINE NASAL SPRAY NA, Place 1 spray into alternate nostrils in the morning and at bedtime., Disp: , Rfl:    senna (SENOKOT) 8.6 MG tablet, Take 2 tablets by mouth at bedtime., Disp: , Rfl:    [Paused] sodium zirconium cyclosilicate  (LOKELMA ) 10 g PACK packet, Take 10 g by mouth daily., Disp: , Rfl:    [Paused] valsartan (DIOVAN) 80 MG tablet, Take 80 mg by mouth daily., Disp: , Rfl:   Vitals   Vitals:   11/03/23 1023 11/03/23 1400  BP: (!) 106/55 (!) 116/54  Pulse: 64 (!) 51  Resp: 16 16  Temp: 97.7 F (36.5 C) 98 F (36.7 C)  TempSrc: Oral Oral  SpO2: 100% 100%    There  is no height or weight on file to calculate BMI.   Physical Exam   Constitutional: No acute distress Psych: Not interactive Eyes: No scleral injection.  HENT: No OP obstruction.  Head: Normocephalic.  Cardiovascular: Normal rate and regular rhythm.  Respiratory: 5 L nasal cannula GI: Soft.  Tenderness with palpation Skin: Bruising on left face, hip dressing dressing CDI  Neurologic Examination    Neuro: Mental Status: Patient is drowsy, nonsensical speech with intermittent phrases. After reorientation able to state she is in the hospital but does not answer other questions  Cranial Nerves: II: Blinks to threat bilaterally. Pupils are equal, round, and reactive to light.   III,IV, VI: EOMI without ptosis or  diploplia.  V: Facial sensation is symmetric to temperature VII: Facial movement is symmetric resting and smiling VIII: Hearing is intact to voice X: Palate elevates symmetrically XI: Shoulder shrug is symmetric. XII: Tongue protrudes midline without atrophy or fasciculations.  Motor: Tone is normal. Bulk is normal.  No drift in bilateral upper extremities  Pain with palpation of lower extremities bilaterally, resists passive ROM. Will not maintain antigravity strength and will not participate in confrontational strength  Sensory: Withdraws to pain bilaterally Cerebellar: No ataxia when attempting FNF, but poor accuracy, slow movements. Gait deferred. Unable to complete HKS  Labs/Imaging/Neurodiagnostic studies   CBC:  Recent Labs  Lab November 17, 2023 1105 17-Nov-2023 1115  WBC  --  9.1  NEUTROABS  --  7.2  HGB 9.5* 9.1*  HCT 28.0* 29.1*  MCV  --  109.4*  PLT  --  282   Basic Metabolic Panel:  Lab Results  Component Value Date   NA 141 11-17-2023   K 4.4 11-17-23   CO2 21 (L) 17-Nov-2023   GLUCOSE 143 (H) November 17, 2023   BUN 74 (H) 17-Nov-2023   CREATININE 2.37 (H) 11/17/23   CALCIUM  8.9 November 17, 2023   GFRNONAA 20 (L) 17-Nov-2023   GFRAA >60 10/05/2019   Lipid  Panel:  Lab Results  Component Value Date   LDLCALC 55 05/21/2022   HgbA1c:  Lab Results  Component Value Date   HGBA1C 7.5 (H) 10/21/2023   Urine Drug Screen: No results found for: LABOPIA, COCAINSCRNUR, LABBENZ, AMPHETMU, THCU, LABBARB  Alcohol  Level     Component Value Date/Time   [facility] <15 10/21/2023 2024   INR  Lab Results  Component Value Date   INR 1.0 10/21/2023   APTT  Lab Results  Component Value Date   APTT 33 05/28/2022    CT Head without contrast(Personally reviewed): Multiple acute to subacute appearing infarcts in both cerebral hemispheres and the left cerebellum (largest). Pattern suggests a recent embolic event.  MR brain confirms the scattered infarcts with the largest infarct in the left cerebellum.  GRE images show no evidence of hemorrhagic transformation  ASSESSMENT   Andrea Kirk is a 83 y.o. female hx of hypertension, diabetes, carotid artery disease s/p left CAE in 2015, right CAE in 2018, diastolic heart failure, mild LVH, prior CVA, GERD, and CAD s/p CABG in 1997,s/p IM rod to repair her right hip fracture with discharge to SNF on 10/20 presenting with AMS.  Her ASA and brilinta  was stopped for the procedure and she was discharged on full dose lovenox  for DVT ppx. In the ED her troponin 779 -> 841 Cardiology consulted; Cr 2.37 (baseline 0.8); AST/ALT - 1001/437;  BNP 673.1. Check LDL prior to resuming statin therapy given elevated liver function. Strokes appear embolic, venous duplex and echo ordered.   RECOMMENDATIONS  - HgbA1c, fasting lipid panel - MRA of the brain without contrast with carotid duplex - Frequent neuro checks - Echocardiogram - Prophylactic therapy-given recent hip surgery and risk for DVT as well as NSTEMI-will discontinue the Lovenox  but start her on IV heparin  stroke protocol no bolus. -Stat CT head if there is any neurochange - Risk factor modification - Telemetry monitoring - PT consult, OT consult, Speech  consult - Stroke team to follow - Bilateral lower extremity venous duplex  ______________________________________________________________________    Signed, Jorene Last, NP [facility]   Attending Neurohospitalist Addendum Patient seen and examined with APP/Resident. Agree with the history and physical as documented above. Agree with the plan as documented, which  I helped formulate. I have independently reviewed the chart, obtained history, review of systems and examined the patient.I have personally reviewed pertinent head/neck/spine imaging (CT/MRI). Please feel free to call with any questions. Plan relayed to Dr. Seena  -- Eligio Lav, MD Neurologist [facility] Pager: [phone]

## 2023-11-03 NOTE — ED Notes (Signed)
 PT to MRI

## 2023-11-03 NOTE — ED Notes (Signed)
 Bladder scan 42mL - MD notified

## 2023-11-03 NOTE — Progress Notes (Signed)
 ED Pharmacy Antibiotic Sign Off An antibiotic consult was received from an ED provider for cefepime  per pharmacy dosing for UTI. A chart review was completed to assess appropriateness.  The following one time order(s) were placed per pharmacy consult:  cefepime  2000 mg x 1 dose  Further antibiotic and/or antibiotic pharmacy consults should be ordered by the admitting provider if indicated.   Thank you for allowing pharmacy to be a part of this patient's care.   Dorn Buttner, PharmD, BCPS 11/03/2023 1:30 PM ED Clinical Pharmacist -  [phone]

## 2023-11-03 NOTE — ED Notes (Signed)
 Notified provider of O2 requirement.

## 2023-11-04 ENCOUNTER — Observation Stay (HOSPITAL_COMMUNITY)

## 2023-11-04 DIAGNOSIS — Z1152 Encounter for screening for COVID-19: Secondary | ICD-10-CM | POA: Diagnosis not present

## 2023-11-04 DIAGNOSIS — S72141A Displaced intertrochanteric fracture of right femur, initial encounter for closed fracture: Secondary | ICD-10-CM | POA: Diagnosis present

## 2023-11-04 DIAGNOSIS — J9811 Atelectasis: Secondary | ICD-10-CM | POA: Diagnosis present

## 2023-11-04 DIAGNOSIS — I6389 Other cerebral infarction: Secondary | ICD-10-CM | POA: Diagnosis not present

## 2023-11-04 DIAGNOSIS — R29712 NIHSS score 12: Secondary | ICD-10-CM

## 2023-11-04 DIAGNOSIS — K7460 Unspecified cirrhosis of liver: Secondary | ICD-10-CM | POA: Diagnosis present

## 2023-11-04 DIAGNOSIS — G9341 Metabolic encephalopathy: Secondary | ICD-10-CM | POA: Diagnosis present

## 2023-11-04 DIAGNOSIS — I6523 Occlusion and stenosis of bilateral carotid arteries: Secondary | ICD-10-CM

## 2023-11-04 DIAGNOSIS — F32A Depression, unspecified: Secondary | ICD-10-CM | POA: Diagnosis present

## 2023-11-04 DIAGNOSIS — R29701 NIHSS score 1: Secondary | ICD-10-CM | POA: Diagnosis not present

## 2023-11-04 DIAGNOSIS — N281 Cyst of kidney, acquired: Secondary | ICD-10-CM | POA: Diagnosis not present

## 2023-11-04 DIAGNOSIS — S72001A Fracture of unspecified part of neck of right femur, initial encounter for closed fracture: Secondary | ICD-10-CM | POA: Diagnosis not present

## 2023-11-04 DIAGNOSIS — I5033 Acute on chronic diastolic (congestive) heart failure: Secondary | ICD-10-CM | POA: Diagnosis present

## 2023-11-04 DIAGNOSIS — G9340 Encephalopathy, unspecified: Secondary | ICD-10-CM | POA: Diagnosis not present

## 2023-11-04 DIAGNOSIS — E785 Hyperlipidemia, unspecified: Secondary | ICD-10-CM

## 2023-11-04 DIAGNOSIS — R1310 Dysphagia, unspecified: Secondary | ICD-10-CM | POA: Diagnosis present

## 2023-11-04 DIAGNOSIS — I10 Essential (primary) hypertension: Secondary | ICD-10-CM | POA: Diagnosis not present

## 2023-11-04 DIAGNOSIS — I69391 Dysphagia following cerebral infarction: Secondary | ICD-10-CM

## 2023-11-04 DIAGNOSIS — K7200 Acute and subacute hepatic failure without coma: Secondary | ICD-10-CM | POA: Diagnosis present

## 2023-11-04 DIAGNOSIS — I130 Hypertensive heart and chronic kidney disease with heart failure and stage 1 through stage 4 chronic kidney disease, or unspecified chronic kidney disease: Secondary | ICD-10-CM | POA: Diagnosis present

## 2023-11-04 DIAGNOSIS — Z1612 Extended spectrum beta lactamase (ESBL) resistance: Secondary | ICD-10-CM | POA: Diagnosis present

## 2023-11-04 DIAGNOSIS — E669 Obesity, unspecified: Secondary | ICD-10-CM | POA: Diagnosis present

## 2023-11-04 DIAGNOSIS — G936 Cerebral edema: Secondary | ICD-10-CM

## 2023-11-04 DIAGNOSIS — J9611 Chronic respiratory failure with hypoxia: Secondary | ICD-10-CM | POA: Diagnosis present

## 2023-11-04 DIAGNOSIS — W1830XA Fall on same level, unspecified, initial encounter: Secondary | ICD-10-CM | POA: Diagnosis present

## 2023-11-04 DIAGNOSIS — D631 Anemia in chronic kidney disease: Secondary | ICD-10-CM | POA: Diagnosis present

## 2023-11-04 DIAGNOSIS — R7401 Elevation of levels of liver transaminase levels: Secondary | ICD-10-CM

## 2023-11-04 DIAGNOSIS — I639 Cerebral infarction, unspecified: Secondary | ICD-10-CM

## 2023-11-04 DIAGNOSIS — N390 Urinary tract infection, site not specified: Secondary | ICD-10-CM | POA: Diagnosis present

## 2023-11-04 DIAGNOSIS — R748 Abnormal levels of other serum enzymes: Secondary | ICD-10-CM | POA: Diagnosis not present

## 2023-11-04 DIAGNOSIS — I2489 Other forms of acute ischemic heart disease: Secondary | ICD-10-CM | POA: Diagnosis not present

## 2023-11-04 DIAGNOSIS — K7689 Other specified diseases of liver: Secondary | ICD-10-CM | POA: Diagnosis not present

## 2023-11-04 DIAGNOSIS — I63449 Cerebral infarction due to embolism of unspecified cerebellar artery: Secondary | ICD-10-CM | POA: Diagnosis present

## 2023-11-04 DIAGNOSIS — E872 Acidosis, unspecified: Secondary | ICD-10-CM | POA: Diagnosis present

## 2023-11-04 DIAGNOSIS — G2581 Restless legs syndrome: Secondary | ICD-10-CM | POA: Diagnosis present

## 2023-11-04 DIAGNOSIS — N179 Acute kidney failure, unspecified: Secondary | ICD-10-CM | POA: Diagnosis not present

## 2023-11-04 DIAGNOSIS — R109 Unspecified abdominal pain: Secondary | ICD-10-CM | POA: Diagnosis not present

## 2023-11-04 DIAGNOSIS — G8191 Hemiplegia, unspecified affecting right dominant side: Secondary | ICD-10-CM | POA: Diagnosis present

## 2023-11-04 DIAGNOSIS — I272 Pulmonary hypertension, unspecified: Secondary | ICD-10-CM | POA: Diagnosis present

## 2023-11-04 DIAGNOSIS — I6340 Cerebral infarction due to embolism of unspecified cerebral artery: Secondary | ICD-10-CM | POA: Diagnosis not present

## 2023-11-04 DIAGNOSIS — E1151 Type 2 diabetes mellitus with diabetic peripheral angiopathy without gangrene: Secondary | ICD-10-CM | POA: Diagnosis not present

## 2023-11-04 DIAGNOSIS — R7989 Other specified abnormal findings of blood chemistry: Secondary | ICD-10-CM | POA: Diagnosis not present

## 2023-11-04 DIAGNOSIS — I21A1 Myocardial infarction type 2: Secondary | ICD-10-CM | POA: Diagnosis present

## 2023-11-04 DIAGNOSIS — E1122 Type 2 diabetes mellitus with diabetic chronic kidney disease: Secondary | ICD-10-CM | POA: Diagnosis present

## 2023-11-04 DIAGNOSIS — E1165 Type 2 diabetes mellitus with hyperglycemia: Secondary | ICD-10-CM | POA: Diagnosis present

## 2023-11-04 LAB — GLUCOSE, CAPILLARY
Glucose-Capillary: 78 mg/dL (ref 70–99)
Glucose-Capillary: 96 mg/dL (ref 70–99)
Glucose-Capillary: 97 mg/dL (ref 70–99)
Glucose-Capillary: 93 mg/dL (ref 70–99)

## 2023-11-04 LAB — COMPREHENSIVE METABOLIC PANEL WITH GFR
ALT: 353 U/L — ABNORMAL HIGH (ref 0–44)
AST: 571 U/L — ABNORMAL HIGH (ref 15–41)
Albumin: 2.4 g/dL — ABNORMAL LOW (ref 3.5–5.0)
Alkaline Phosphatase: 88 U/L (ref 38–126)
Anion gap: 12 (ref 5–15)
BUN: 58 mg/dL — ABNORMAL HIGH (ref 8–23)
CO2: 24 mmol/L (ref 22–32)
Calcium: 8.9 mg/dL (ref 8.9–10.3)
Chloride: 108 mmol/L (ref 98–111)
Creatinine, Ser: 1.52 mg/dL — ABNORMAL HIGH (ref 0.44–1.00)
GFR, Estimated: 34 mL/min — ABNORMAL LOW (ref >60)
Glucose, Bld: 74 mg/dL (ref 70–99)
Potassium: 4.3 mmol/L (ref 3.5–5.1)
Sodium: 144 mmol/L (ref 135–145)
Total Bilirubin: 1.2 mg/dL (ref 0.0–1.2)
Total Protein: 6.3 g/dL — ABNORMAL LOW (ref 6.5–8.1)

## 2023-11-04 LAB — CBC
HCT: 26.4 % — ABNORMAL LOW (ref 36.0–46.0)
Hemoglobin: 8.1 g/dL — ABNORMAL LOW (ref 12.0–15.0)
MCH: 33.6 pg (ref 26.0–34.0)
MCHC: 30.7 g/dL (ref 30.0–36.0)
MCV: 109.5 fL — ABNORMAL HIGH (ref 80.0–100.0)
Platelets: 250 K/uL (ref 150–400)
RBC: 2.41 MIL/uL — ABNORMAL LOW (ref 3.87–5.11)
RDW: 16.5 % — ABNORMAL HIGH (ref 11.5–15.5)
WBC: 8 K/uL (ref 4.0–10.5)
nRBC: 0.5 % — ABNORMAL HIGH (ref 0.0–0.2)

## 2023-11-04 LAB — ECHOCARDIOGRAM LIMITED BUBBLE STUDY
Area-P 1/2: 3.91 cm2
Height: 62 in
S' Lateral: 3.3 cm
Weight: 2677.27 [oz_av]

## 2023-11-04 LAB — LIPID PANEL
Cholesterol: 66 mg/dL (ref 0–200)
HDL: 11 mg/dL — ABNORMAL LOW (ref >40)
LDL Cholesterol: 32 mg/dL (ref 0–99)
Total CHOL/HDL Ratio: 6 ratio
Triglycerides: 117 mg/dL (ref <150)
VLDL: 23 mg/dL (ref 0–40)

## 2023-11-04 LAB — FOLATE: Folate: >20 ng/mL (ref >5.9)

## 2023-11-04 LAB — VITAMIN B12: Vitamin B-12: 1976 pg/mL — ABNORMAL HIGH (ref 180–914)

## 2023-11-04 LAB — HEMOGLOBIN A1C
Hgb A1c MFr Bld: 5.7 % — ABNORMAL HIGH (ref 4.8–5.6)
Mean Plasma Glucose: 116.89 mg/dL

## 2023-11-04 LAB — HEPARIN LEVEL (UNFRACTIONATED): Heparin Unfractionated: 0.1 [IU]/mL — ABNORMAL LOW (ref 0.30–0.70)

## 2023-11-04 MED ORDER — ALBUTEROL SULFATE (2.5 MG/3ML) 0.083% IN NEBU: 2.5000 mg | INHALATION_SOLUTION | Freq: Four times a day (QID) | RESPIRATORY_TRACT | Status: DC | PRN

## 2023-11-04 MED ORDER — HEPARIN SODIUM (PORCINE) 5000 UNIT/ML IJ SOLN
5000.0000 [IU] | Freq: Three times a day (TID) | INTRAMUSCULAR | Status: DC
  Administered 2023-11-04 – 2023-11-07 (×10): 5000 [IU] via SUBCUTANEOUS
  Filled 2023-11-04 (×10): qty 1

## 2023-11-04 MED ORDER — CHLORHEXIDINE GLUCONATE CLOTH 2 % EX PADS
6.0000 | MEDICATED_PAD | Freq: Every day | CUTANEOUS | Status: DC
  Administered 2023-11-04 – 2023-11-07 (×4): 6 via TOPICAL

## 2023-11-04 MED ORDER — ASPIRIN 81 MG PO CHEW
81.0000 mg | CHEWABLE_TABLET | Freq: Every day | ORAL | Status: DC
  Administered 2023-11-04 – 2023-11-07 (×4): 81 mg via ORAL
  Filled 2023-11-04 (×4): qty 1

## 2023-11-04 MED ORDER — VITAMIN B-12 1000 MCG PO TABS
1000.0000 ug | ORAL_TABLET | ORAL | Status: DC
  Administered 2023-11-04 – 2023-11-07 (×4): 1000 ug via ORAL
  Filled 2023-11-04 (×4): qty 1

## 2023-11-04 MED ORDER — GABAPENTIN 300 MG PO CAPS
300.0000 mg | ORAL_CAPSULE | Freq: Two times a day (BID) | ORAL | Status: DC
  Administered 2023-11-04 – 2023-11-07 (×6): 300 mg via ORAL
  Filled 2023-11-04 (×6): qty 1

## 2023-11-04 MED ORDER — SODIUM CHLORIDE 0.9 % IV SOLN: INTRAVENOUS | Status: AC

## 2023-11-04 NOTE — Progress Notes (Addendum)
 STROKE TEAM PROGRESS NOTE    SIGNIFICANT HOSPITAL EVENTS: 10/27 - 83 y.o. female s/p right hip fracture repair who presented from nursing facility with altered mental status. Found to have AKI, UTI, acute liver failure.   INTERIM HISTORY/SUBJECTIVE: Patient very drowsy, somnolent but following commands. Oriented to place and name only. Endorses right hip pain. Asterixis noted, likely due to encephalopathy.   -Continue ASA 81 mg + SQ heparin  for VTE prophylaxis  -Bilateral Venous US  - negative for DVT -Carotid doppler - mild to moderate stenosis of L ICA -2D echo and TCD bubble study pending -Blood cultures x2 NGTD -Ucx pos for GNR  OBJECTIVE:  CBC    Component Value Date/Time   WBC 8.0 11/04/2023 0405   RBC 2.41 (L) 11/04/2023 0405   HGB 8.1 (L) 11/04/2023 0405   HGB 12.2 05/21/2022 1231   HCT 26.4 (L) 11/04/2023 0405   HCT 36.0 05/21/2022 1231   PLT 250 11/04/2023 0405   PLT 136 (L) 05/21/2022 1231   MCV 109.5 (H) 11/04/2023 0405   MCV 106 (H) 05/21/2022 1231   MCH 33.6 11/04/2023 0405   MCHC 30.7 11/04/2023 0405   RDW 16.5 (H) 11/04/2023 0405   RDW 13.0 05/21/2022 1231   LYMPHSABS 1.2 11/03/2023 1115   MONOABS 0.5 11/03/2023 1115   EOSABS 0.1 11/03/2023 1115   BASOSABS 0.1 11/03/2023 1115    BMET    Component Value Date/Time   NA 144 11/04/2023 0405   NA 142 05/21/2022 1231   K 4.3 11/04/2023 0405   CL 108 11/04/2023 0405   CO2 24 11/04/2023 0405   GLUCOSE 74 11/04/2023 0405   BUN 58 (H) 11/04/2023 0405   BUN 27 05/21/2022 1231   CREATININE 1.52 (H) 11/04/2023 0405   CREATININE 0.80 10/05/2019 1427   CALCIUM  8.9 11/04/2023 0405   EGFR 66 05/21/2022 1231   GFRNONAA 34 (L) 11/04/2023 0405   GFRNONAA >60 10/05/2019 1427    IMAGING past 24 hours VAS US  CAROTID Result Date: 11/04/2023 Carotid Arterial Duplex Study Patient Name:  JO-ANNE KLUTH Ponciano  Date of Exam:   11/04/2023 Medical Rec #: [id number]      Accession #:    [id number] Date of Birth: [birth year]        Patient Gender: F Patient Age:   86 years Exam Location:  [facility] Procedure:      VAS US  CAROTID Referring Phys: DEVON SHAFER --------------------------------------------------------------------------------  Indications:       Right endarterectomy 03/22/16. Left endarterectomy 04/17/23. Risk Factors:      Hypertension, hyperlipidemia, Diabetes, no history of                    smoking. Comparison Study:  Prior carotid duplex done 01/31/23 indicating 1-39% right ICA                    stenosis and 40-59% left ICA stenosis Performing Technologist: Alberta Lis RVS  Examination Guidelines: A complete evaluation includes B-mode imaging, spectral Doppler, color Doppler, and power Doppler as needed of all accessible portions of each vessel. Bilateral testing is considered an integral part of a complete examination. Limited examinations for reoccurring indications may be performed as noted.  Right Carotid Findings: +----------+--------+--------+--------+------------------+---------+           PSV cm/sEDV cm/sStenosisPlaque DescriptionComments  +----------+--------+--------+--------+------------------+---------+ CCA Prox  165     16                                          +----------+--------+--------+--------+------------------+---------+  CCA Distal84      13                                          +----------+--------+--------+--------+------------------+---------+ ICA Prox  62      8       1-39%   heterogenous      Shadowing +----------+--------+--------+--------+------------------+---------+ ICA Mid   75      15                                tortuous  +----------+--------+--------+--------+------------------+---------+ ICA Distal92      20                                tortuous  +----------+--------+--------+--------+------------------+---------+ ECA       317     15      >50%                                 +----------+--------+--------+--------+------------------+---------+ +----------+--------+-------+--------+-------------------+           PSV cm/sEDV cmsDescribeArm Pressure (mmHG) +----------+--------+-------+--------+-------------------+ [id number]                                        +----------+--------+-------+--------+-------------------+ +---------+--------+--+--------+--+---------+ VertebralPSV cm/s48EDV cm/s12Antegrade +---------+--------+--+--------+--+---------+  Left Carotid Findings: +----------+--------+--------+--------+------------------+---------+           PSV cm/sEDV cm/sStenosisPlaque DescriptionComments  +----------+--------+--------+--------+------------------+---------+ CCA Prox  122     12              calcific                    +----------+--------+--------+--------+------------------+---------+ CCA Distal95      15              heterogenous                +----------+--------+--------+--------+------------------+---------+ ICA Prox  211     32      40-59%  heterogenous      Shadowing +----------+--------+--------+--------+------------------+---------+ ICA Mid   147     32                                tortuous  +----------+--------+--------+--------+------------------+---------+ ICA Distal90      21                                          +----------+--------+--------+--------+------------------+---------+ ECA       161     15                                          +----------+--------+--------+--------+------------------+---------+ +----------+--------+--------+--------+-------------------+           PSV cm/sEDV cm/sDescribeArm Pressure (mmHG) +----------+--------+--------+--------+-------------------+ [id number]                                         +----------+--------+--------+--------+-------------------+ +---------+--------+--+--------+--+--------+  VertebralPSV cm/s32EDV  cm/s10Atypical +---------+--------+--+--------+--+--------+   Summary: Right Carotid: Velocities in the right ICA are consistent with a 1-39% stenosis. Left Carotid: Velocities in the left ICA are consistent with a 40-59% stenosis.               Post stenotic waveform noted proximal ICA, unable to match               previous diastolic velocities. Vertebrals:  Right vertebral artery demonstrates antegrade flow. The left              vertebral artery exhibits an atypical waveform. Subclavians: Normal flow hemodynamics were seen in bilateral subclavian              arteries. *See table(s) above for measurements and observations.     Preliminary    VAS US  LOWER EXTREMITY VENOUS (DVT) Result Date: 11/04/2023  Lower Venous DVT Study Patient Name:  HELINA HULLUM Therien  Date of Exam:   11/04/2023 Medical Rec #: [id number]      Accession #:    [id number] Date of Birth: [birth year]       Patient Gender: F Patient Age:   57 years Exam Location:  [facility] Procedure:      VAS US  LOWER EXTREMITY VENOUS (DVT) Referring Phys: MARSA MELVIN --------------------------------------------------------------------------------  Indications: Stroke.  Limitations: Patient unable to rotate legs secondary to altered mental status, somnolence. Comparison Study: No prior study on file Performing Technologist: Alberta Lis RVS  Examination Guidelines: A complete evaluation includes B-mode imaging, spectral Doppler, color Doppler, and power Doppler as needed of all accessible portions of each vessel. Bilateral testing is considered an integral part of a complete examination. Limited examinations for reoccurring indications may be performed as noted. The reflux portion of the exam is performed with the patient in reverse Trendelenburg.  +---------+---------------+---------+-----------+----------+-------------------+ RIGHT    CompressibilityPhasicitySpontaneityPropertiesThrombus Aging       +---------+---------------+---------+-----------+----------+-------------------+ CFV      Full           Yes      No                                       +---------+---------------+---------+-----------+----------+-------------------+ SFJ      Full                                                             +---------+---------------+---------+-----------+----------+-------------------+ FV Prox  Full           Yes      No                                       +---------+---------------+---------+-----------+----------+-------------------+ FV Mid   Full                                                             +---------+---------------+---------+-----------+----------+-------------------+ FV DistalFull           Yes  No                                       +---------+---------------+---------+-----------+----------+-------------------+ PFV      Full           Yes      No                                       +---------+---------------+---------+-----------+----------+-------------------+ POP                     Yes      No                   patent by color and                                                       Doppler             +---------+---------------+---------+-----------+----------+-------------------+ PTV      Full                                                             +---------+---------------+---------+-----------+----------+-------------------+ PERO     Full                                                             +---------+---------------+---------+-----------+----------+-------------------+   +---------+---------------+---------+-----------+----------+-------------------+ LEFT     CompressibilityPhasicitySpontaneityPropertiesThrombus Aging      +---------+---------------+---------+-----------+----------+-------------------+ CFV      Full           Yes      No                                        +---------+---------------+---------+-----------+----------+-------------------+ SFJ      Full                                                             +---------+---------------+---------+-----------+----------+-------------------+ FV Prox  Full           Yes      Yes                                      +---------+---------------+---------+-----------+----------+-------------------+ FV Mid   Full                                                             +---------+---------------+---------+-----------+----------+-------------------+  FV Distal               Yes      Yes                  patent by color and                                                       Doppler             +---------+---------------+---------+-----------+----------+-------------------+ PFV      Full           Yes      No                                       +---------+---------------+---------+-----------+----------+-------------------+ POP                                                   patent by color and                                                       Doppler             +---------+---------------+---------+-----------+----------+-------------------+ PTV      Full                                                             +---------+---------------+---------+-----------+----------+-------------------+ PERO     Full                                                             +---------+---------------+---------+-----------+----------+-------------------+    Summary: RIGHT: - No evidence of deep vein thrombosis in the lower extremity. No indirect evidence of obstruction proximal to the inguinal ligament.  - No cystic structure found in the popliteal fossa.  LEFT: - No evidence of deep vein thrombosis in the lower extremity. No indirect evidence of obstruction proximal to the inguinal ligament.  - No cystic structure found in the popliteal fossa.  *See  table(s) above for measurements and observations.    Preliminary    CT ABDOMEN PELVIS WO CONTRAST Result Date: 11/04/2023 CLINICAL DATA:  Abdominal pain. EXAM: CT ABDOMEN AND PELVIS WITHOUT CONTRAST TECHNIQUE: Multidetector CT imaging of the abdomen and pelvis was performed following the standard protocol without IV contrast. RADIATION DOSE REDUCTION: This exam was performed according to the departmental dose-optimization program which includes automated exposure control, adjustment of the mA and/or kV according to patient size and/or use of iterative reconstruction technique. COMPARISON:  11/03/2023 FINDINGS: Lower chest: Mild stable cardiomegaly. Median  sternotomy wires are present. Evidence of previous CABG. Calcified plaque over the descending thoracic aorta. Bibasilar opacification right worse than left likely atelectasis. Hepatobiliary: Mild nodularity to the liver contour which can be seen with cirrhosis. No focal liver mass. Gallbladder and biliary tree are normal. Pancreas: Normal. Spleen: Normal. Adrenals/Urinary Tract: Adrenal glands are normal. Kidneys are normal in size without hydronephrosis or nephrolithiasis. Stable 2.9 cm simple left renal cyst over the lower pole. Ureters are unremarkable. Foley catheter is present within a decompressed bladder. Stomach/Bowel: Stomach and small bowel are normal. Appendix is unremarkable. Mild fecal retention throughout the colon which is otherwise unremarkable. Vascular/Lymphatic: Calcified plaque over the abdominal aorta which is normal caliber. Remaining vascular structures are unremarkable. No adenopathy. Reproductive: Uterus and bilateral adnexa are unremarkable. Other: No free fluid or focal inflammatory change. Musculoskeletal: Hardware over the right proximal femur fixating patient's right hip fracture. Stable subcutaneous hematomas over the soft tissues of the right hip with skin staples present compatible patient's recent surgery. IMPRESSION: 1. No  acute findings in the abdomen/pelvis. 2. Mild nodularity to the liver contour which can be seen with cirrhosis. 3. Stable 2.9 cm simple left renal cyst. No follow-up imaging is recommended. 4. Stable bibasilar opacification right worse than left likely atelectasis. 5. Aortic atherosclerosis. 6. Postsurgical changes with stable subcutaneous hematomas over the right hip. Aortic Atherosclerosis (ICD10-I70.0). Electronically Signed   By: Toribio Agreste M.D.   On: 11/04/2023 09:16   MR ANGIO HEAD WO CONTRAST Result Date: 11/03/2023 EXAM: MR Angiography Head without intravenous Contrast. 11/03/2023 05:41:36 PM TECHNIQUE: Magnetic resonance angiography images of the head without intravenous contrast. Multiplanar 2D and 3D reformatted images are provided for review. COMPARISON: MRI head and CT head earlier same day and CTA head 04/16/2020. CLINICAL HISTORY: Stroke, follow up. FINDINGS: ANTERIOR CIRCULATION: The intracranial internal carotid arteries are patent bilaterally. There is mild atherosclerotic irregularity of the bilateral carotid siphons. The visualized portions of the anterior cerebral arteries are patent bilaterally. The M1 segments of the MCAs are patent bilaterally. There is slightly limited evaluation of bilateral MCA branches due to artifact; within these limitations, no focal occlusion is identified. No aneurysm. 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 The posterior cerebral arteries are patent proximally; slightly limited evaluation of distal branches due to artifact. The basilar artery is patent. The intracranial vertebral artery on the right is patent to the vertebrobasilar confluence. There is limited visualization of the proximal left V4 segment, likely related to atherosclerosis and high grade stenosis seen on the prior CTA. The superior cerebellar arteries are patent bilaterally. No aneurysm. IMPRESSION: 1. No acute findings. 2. Severe stenosis of  the P1 segment of the right PCA. 3. Limited visualization of the proximal left V4 segment, likely related to atherosclerosis and high-grade stenosis seen on prior CTA. 4. Mild atherosclerotic irregularity of the bilateral carotid siphons. Electronically signed by: Donnice Mania MD 11/03/2023 06:50 PM EDT RP Workstation: [id number]   MR BRAIN WO CONTRAST Result Date: 11/03/2023 EXAM: MRI BRAIN WITHOUT CONTRAST 11/03/2023 03:36:35 PM TECHNIQUE: Multiplanar multisequence MRI of the head/brain was performed without the administration of intravenous contrast. COMPARISON: CT head earlier same day. CLINICAL HISTORY: Neuro deficit, acute, stroke suspected. Patient presents for altered mental status. Medical history includes CAD, HTN, HLD, GERD, DM, depression, anxiety, CVA, CKD. She is currently 12 days postop from repair of a right hip fracture. She had postoperative anemia and received 2 units PRBCs. She was discharged 7 days ago. Per EMS,  nursing facility reported that she was in her normal state of health 2 days ago. She was reportedly walking on her recently repaired hip. She is alert, oriented and conversant at baseline. She had an acute change in her mental status yesterday. She underwent outpatient lab work yesterday which showed AKI. FINDINGS: BRAIN AND VENTRICLES: Multiple scattered areas of acute infarct are noted. The dominant region of infarct involves the left cerebellum within the left PICA territory. There are a few additional punctate scattered areas of acute infarct within the right cerebellum. An additional focus of infarct is in the left frontal lobe centered within the left middle frontal gyrus. There are scattered areas of acute infarct within the posterior frontal lobes and parietal lobes bilaterally. An additional infarct is in the posterior and lateral cortex of the right occipital lobe. Additional punctate areas of acute infarct are in the left occipital cortex. Edema within the left cerebellum  in the region of infarct resulting in mild local mass effect. No acute intracranial hemorrhage. No effacement of the fourth ventricle. No hydrocephalus. No midline shift. Mild chronic microvascular ischemic changes. Bilateral lens replacement. The sella is unremarkable. Normal flow voids. ORBITS: No acute abnormality. SINUSES AND MASTOIDS: No acute abnormality. BONES AND SOFT TISSUES: Normal marrow signal. No acute soft tissue abnormality. IMPRESSION: 1. 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 2. Additional acute infarcts in the right cerebellum, left frontal lobe, bilateral posterior frontal and parietal lobes, right occipital lobe, and left occipital cortex. 3. No effacement of the fourth ventricle, hydrocephalus, or midline shift. 4. Mild chronic microvascular ischemic changes. Electronically signed by: Donnice Mania MD 11/03/2023 05:35 PM EDT RP Workstation: [id number]    Vitals:   11/04/23 0400 11/04/23 0500 11/04/23 0751 11/04/23 1200  BP: 133/62  (!) 145/68 111/82  Pulse: 61  (!) 57   Resp: 16  18 17   Temp: 98.1 F (36.7 C)  99.1 F (37.3 C)   TempSrc:   Oral   SpO2: 93%  97% 98%  Weight:  75.9 kg    Height:         PHYSICAL EXAM: General:  Alert, well-nourished, well-developed patient in no acute distress Psych:  Mood and affect appropriate for situation CV: Regular rate and rhythm on monitor Respiratory:  Regular, unlabored respirations on room air GI: Abdomen soft and nontender   NEURO:  Mental Status: Drowsy, somnolent, but alerts in response to verbal stimuli. Patient is able to give limited history. Oriented to place only. Follows some simple commands. Speech/Language: speech is without dysarthria or aphasia. Naming, repetition, fluency, and comprehension intact.  Cranial Nerves:  II: Visual fields full. Blinks to threat bilaterally. III, IV, VI: EOMI.  Eyelids elevate symmetrically.  V: Sensation is intact to light touch and symmetrical to face.  VII: Slight L facial droop VIII: hearing intact to voice. IX, X: Palate elevates symmetrically. Phonation is normal.  XII: Tongue is midline without fasciculations. Motor: Antigravity strength without drift present RUE and LUE. Will not maintain antigravity strength of BLE. B/l mild asterixis Tone: is normal and bulk is normal Sensation- Patient not cooperative with sensory exam. Gait - deferred  Most Recent NIH 12   ASSESSMENT/PLAN:  Ms. Yazlyn Wentzel Furlan is a 83 y.o. female with history of  hypertension, hyperlipidemia, diabetes, CAD s/p CABG, CVA, carotid artery disease s/p left CAE in 2015,  right CAE in 2018, CKD 2, GERD, RLS, diastolic  CHF, depression, anxiety, chronic respiratory failure, chronic pain admitted for altered mental status. She is s/p IM rod to repair her right hip fracture. Her ASA and brilinta  was stopped for the procedure and she was discharged on full dose lovenox  for DVT ppx. NIH on Admission 8.  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 Pattern suggests a recent embolic event. No hemorrhagic transformation or intracranial mass effect.  MRI: Multifocal acute infarcts with dominant involvement of the left cerebellum in the left PICA territory with associated edema causing mild local mass effect. Additional acute infarcts in the right cerebellum, left frontal lobe, bilateral posterior frontal and parietal lobes, right occipital lobe, and left occipital cortex. MRA: No acute findings. 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 Mild atherosclerotic irregularity of the bilateral carotid siphons.  2D Echo EF >75% Bilateral LE venous US  - negative for DVT Carotid  duplex US  - L ICA 40-59% stenosis  Will consider loop recorder if workup negative LDL 32 HgbA1c 5.7 VTE prophylaxis - SQ Heparin  Patient taking Lovenox  post-hip fracture repair prior to admission, now on aspirin  81 mg daily. No DAPT given large cerebellar infarct. Will consider DAPT in a couple of days if neuro stable.  Therapy recommendations:  SNF Disposition: Pending  History of stroke 04/2020 admitted for left temporal cortex infarct on MRI.  CT also showed old cerebellar infarct.  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  CT perfusion negative.  EF 50 to 50%.  LDL 82, A1c 6.9.  Discharged on aspirin  and Brilinta  and Crestor .  Recommend loop recorder but patient prefer to be done as outpatient at that time.  However, apparently that was not done since.  Hypertension Home meds: Amlodipine , Furosemide , Valsartan Stable Avoid low BP Long term BP goal of normotension  AKI / UTI/ Uremia Cre 0.84--2.40-2.37-1.52 IV fluids @ 100 Avoid nephrotoxic agents On Rocephin   Blood Cx NGTD Urine Cx G- Rods If blood culture positive will consider TEE to rule out endocarditis  Transaminitis AST/ALT 1001/433--571/353 Could be related to dehydration and immobility Close monitoring  Hyperlipidemia Home meds: Rosuvastatin  20 mg LDL 32, goal < 70 Continue statin once LFTs normalizes Patient has genetically low HDL  Diabetes type II Controlled Home meds: Jardiance 25 mg, Glipizide  5 mg HgbA1c 5.7, goal < 7.0 CBGs SSI Recommend close follow-up with PCP for better DM control  Dysphagia Patient has post-stroke dysphagia, SLP consulted On dysphagia 3 and thin liquid Advance diet as tolerated  Other Stroke Risk Factors Obesity, Body mass index is 30.6 kg/m., BMI >/= 30 associated with increased stroke risk, recommend weight loss, diet and exercise as appropriate  CAD status post CABG in 1997 PVD, status post bilateral CEA in 2015 on the left and 2018 on the right Diastolic  congestive heart failure Advanced age, >/= 70  Other Active Problems Depression/ Anxiety - continue BuSpar  and Celexa  when able Chronic respiratory failure with hypoxia Recent right hip surgery -> discharged to [facility] 10/20  Hospital day # 0  Ashley Gravely, MD, PGY-1  ATTENDING NOTE: I reviewed above note and agree with the assessment and plan. Pt was seen and examined.   Speech therapist is at the bedside.  Patient drowsy, sleepy, only briefly open eyes on command, orientated to place only, not oriented due to age or time. No aphasia, paucity  of speech, only answer questions, following some simple commands. Able to name 2/2 simple object and repeat single sentence. No gaze palsy, no visual field deficit, pupil equal size.  Mild left facial asymmetry. Tongue protrusion not cooperated. Bilateral UEs against gravity but with drift and mild asterixis bilaterally. LLE mild withdraw to pain, right lower extremity pain from hip to knee with limited movement. Sensation, coordination not cooperative and gait not tested.  For detailed assessment and plan, please refer to above as I have made changes wherever appropriate.   Ary Cummins, MD PhD Stroke Neurology 11/04/2023 5:16 PM  I discussed with Dr. Dennise. I spent extensive total face-to-face time with the patient and family, reviewing test results, images and medication, and discussing the diagnosis, treatment plan and potential prognosis. This patient's care requiresreview of multiple databases, neurological assessment, discussion with family, other specialists and medical decision making of high complexity.      To contact Stroke Continuity provider, please refer to [email or web address]. After hours, contact General Neurology

## 2023-11-04 NOTE — Progress Notes (Signed)
 Ok to reduce gabapentin  to 300mg  BID due to her renal function per Dr. Dennise.  Sergio Batch, PharmD, BCIDP, AAHIVP, CPP Infectious Disease Pharmacist 11/04/2023 2:36 PM

## 2023-11-04 NOTE — Progress Notes (Signed)
 VASCULAR LAB    Carotid duplex has been performed.  See CV proc for preliminary results.   [name], RVT 11/04/2023, 2:51 PM

## 2023-11-04 NOTE — Evaluation (Signed)
 Occupational Therapy Evaluation Patient Details Name: Andrea Kirk MRN: [id number] DOB: 27-Feb-1940 Today's Date: 11/04/2023   History of Present Illness   Pt is an 83 y.o. female presenting 10/27 with AMS. 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   PMHx: HTN, HLD, anxiety, CAD, s/p left CAE in 2015, right CAE in 2018, s/p CABG in 1997, anemia, T2DM, GERD, RLS, and CVA.   Recently admitted 10/14-10/20 with right hip fracture status post repair and discharged to SNF.     Clinical Impressions PTA, per chart review, pt resides at a SNF and has recently been in rehab s/p hip surgery. Unclear most recent functional level; pt reports that she was not working on getting OOB much at SNF unsure accuracy of report as pt oriented to self and location only, but believes she is here for hip fx not CVA. On eval, pt needing max-total A for bed mobility but once EOB, able to wash face and self feed ~3 bites of yogurt with CGA and intermittent min A for balance. Pt initially lethargic and once aroused, constantly asking to lie down 2/2 hip pain. RN notified and reports she has recently had tylenol  but can reach to MD to see what other options may be available for pain management. OT to continue to follow acutely. Patient will benefit from continued inpatient follow up therapy, <3 hours/day     If plan is discharge home, recommend the following:   A lot of help with walking and/or transfers;Two people to help with walking and/or transfers;A lot of help with bathing/dressing/bathroom;Two people to help with bathing/dressing/bathroom;Assist for transportation;Help with stairs or ramp for entrance;Assistance with feeding;Supervision due to cognitive status     Functional Status Assessment   Patient has had a recent decline in their functional status and demonstrates the ability to make significant improvements in function in a reasonable and predictable  amount of time.     Equipment Recommendations   None recommended by OT     Recommendations for Other Services         Precautions/Restrictions   Precautions Precautions: Fall Recall of Precautions/Restrictions: Impaired Restrictions Weight Bearing Restrictions Per Provider Order: No RLE Weight Bearing Per Provider Order: Weight bearing as tolerated     Mobility Bed Mobility Overal bed mobility: Needs Assistance Bed Mobility: Supine to Sit, Rolling Rolling: Max assist, +2 for physical assistance   Supine to sit: +2 for physical assistance, +2 for safety/equipment, Total assist Sit to supine: Max assist, +2 for physical assistance, +2 for safety/equipment   General bed mobility comments: Pt requires Total A to get to EOB and Max A +2 for sitting to supine with assist at trunk and LE    Transfers                   General transfer comment: unable to attempt      Balance                                           ADL either performed or assessed with clinical judgement   ADL Overall ADL's : Needs assistance/impaired Eating/Feeding: Set up;Sitting Eating/Feeding Details (indicate cue type and reason): CGA at EOB Grooming: Set up;Sitting   Upper Body Bathing: Set up;Sitting   Lower Body Bathing: Total assistance   Upper Body Dressing : Set up;Sitting   Lower Body  Dressing: Total assistance;+2 for physical assistance;+2 for safety/equipment     Toilet Transfer Details (indicate cue type and reason): lateral scoot with total A toward HOB                 Vision Baseline Vision/History: 1 Wears glasses (per chart review not donned in session) Additional Comments: not formally assessed this session as pt distracted by pain, and lethargic when not calling out in pain.     Perception         Praxis         Pertinent Vitals/Pain Pain Assessment Pain Assessment: Faces Faces Pain Scale: Hurts even more Breathing:  occasional labored breathing, short period of hyperventilation Negative Vocalization: occasional moan/groan, low speech, negative/disapproving quality Facial Expression: sad, frightened, frown Body Language: tense, distressed pacing, fidgeting Consolability: distracted or reassured by voice/touch PAINAD Score: 5 Pain Location: R hip Pain Descriptors / Indicators: Discomfort, Grimacing, Guarding Pain Intervention(s): Limited activity within patient's tolerance, Monitored during session     Extremity/Trunk Assessment Upper Extremity Assessment Upper Extremity Assessment: Generalized weakness;Difficult to assess due to impaired cognition (using BUE functionally to hold yogurt with LUE and self feed with RUE, appears to be R handed)   Lower Extremity Assessment Lower Extremity Assessment: Defer to PT evaluation RLE Deficits / Details: s/p femur IM rod RLE: Unable to fully assess due to pain   Cervical / Trunk Assessment Cervical / Trunk Assessment: Normal   Communication Communication Communication: No apparent difficulties;Impaired Factors Affecting Communication: Other (comment) (pt with unintelligible speech sounds at times, only speaking 1-2 words at a time this session)   Cognition Arousal: Lethargic Behavior During Therapy: Flat affect Cognition: No family/caregiver present to determine baseline             OT - Cognition Comments: Unsure of baseline. Pt with intermittent intelligable speech/sounds when in pain, needed maximal cues to not yell, communicate and to PLB. able to state current location, month of birth. believed she was in hospital for fall (CVA dx)                 Following commands: Impaired Following commands impaired: Follows one step commands with increased time     Cueing  General Comments   Cueing Techniques: Verbal cues;Tactile cues  VSS on 2L O2 via Spurgeon HR 50-60s sitting EOB   Exercises     Shoulder Instructions      Home Living  Family/patient expects to be discharged to:: Skilled nursing facility     Type of Home: Other(Comment)                           Additional Comments: LTC at [facility], 3 years      Prior Functioning/Environment Prior Level of Function : Independent/Modified Independent             Mobility Comments: Prior to fall on 10/14 pt chart states pt can get out of bed on her own, transfers using AD, ambulates to the bathroom using rollator, otherwise in a manual w/c. She states staff sometimes helps her with mobility. 1 fall ADLs Comments: Per previous chart before hospitalization she sponge bathes at baseline. She can get herself dressed, toileted, and groomed. Staff manage meds and meals.    OT Problem List: Decreased strength;Decreased activity tolerance;Impaired balance (sitting and/or standing);Decreased cognition;Decreased safety awareness;Decreased knowledge of use of DME or AE;Decreased knowledge of precautions;Pain   OT Treatment/Interventions: Self-care/ADL training;Therapeutic exercise;DME and/or AE instruction;Therapeutic activities;Patient/family  education;Balance training      OT Goals(Current goals can be found in the care plan section)   Acute Rehab OT Goals Patient Stated Goal: lay down OT Goal Formulation: With patient Time For Goal Achievement: 11/18/23 Potential to Achieve Goals: Fair   OT Frequency:  Min 2X/week    Co-evaluation PT/OT/SLP Co-Evaluation/Treatment: Yes Reason for Co-Treatment: For patient/therapist safety;To address functional/ADL transfers PT goals addressed during session: Mobility/safety with mobility;Balance;Proper use of DME OT goals addressed during session: ADL's and self-care      AM-PAC OT 6 Clicks Daily Activity     Outcome Measure Help from another person eating meals?: None Help from another person taking care of personal grooming?: A Little Help from another person toileting, which includes using toliet, bedpan,  or urinal?: Total Help from another person bathing (including washing, rinsing, drying)?: A Lot Help from another person to put on and taking off regular upper body clothing?: A Little Help from another person to put on and taking off regular lower body clothing?: Total 6 Click Score: 14   End of Session Equipment Utilized During Treatment: Oxygen  Nurse Communication: Mobility status  Activity Tolerance: Patient tolerated treatment well Patient left: in bed;with call bell/phone within reach;with bed alarm set  OT Visit Diagnosis: Unsteadiness on feet (R26.81);Other abnormalities of gait and mobility (R26.89);Muscle weakness (generalized) (M62.81);History of falling (Z91.81);Pain;Other symptoms and signs involving cognitive function Pain - Right/Left: Right Pain - part of body: Hip                Time: 8847-8788 OT Time Calculation (min): 19 min Charges:  OT General Charges $OT Visit: 1 Visit OT Evaluation $OT Eval Moderate Complexity: 1 Mod  Elma JONETTA Lebron FREDERICK, OTR/L [facility] Acute Rehabilitation Office: [phone]   Elma JONETTA Lebron 11/04/2023, 12:49 PM

## 2023-11-04 NOTE — Evaluation (Signed)
 Speech Language Pathology Evaluation Patient Details Name: Andrea Kirk MRN: [id number] DOB: 11/18/1940 Today's Date: 11/04/2023 Time: 8886-8868 SLP Time Calculation (min) (ACUTE ONLY): 18 min  Problem List:  Patient Active Problem List   Diagnosis Date Noted   Abnormal transaminases 11/03/2023   High anion gap metabolic acidosis 11/03/2023   Uremia 11/03/2023   NSTEMI (non-ST elevated myocardial infarction) (HCC) 11/03/2023   Acute on chronic diastolic CHF (congestive heart failure) (HCC) 11/03/2023   Acute CVA (cerebrovascular accident) (HCC) 11/03/2023   Preop examination 10/22/2023   Fall 10/22/2023   Displaced intertrochanteric fracture of right femur, initial encounter for closed fracture (HCC) 10/21/2023   Chronic respiratory failure with hypoxia (HCC) 10/21/2023   Bilateral sensorineural hearing loss 07/25/2022   History of sepsis 2/2 UTI 05/28/2022   Nausea 02/17/2021   C. difficile colitis 02/15/2021   Elevated troponin 02/14/2021   Gram-negative bacteremia 02/14/2021   Thrombocytopenia 02/13/2021   Acute encephalopathy 02/13/2021   Anxiety and depression 02/13/2021   Closed fracture of acromial process of right scapula 11/21/2020   Syncope, vasovagal 06/23/2020   Pulmonary hypertension, unspecified (HCC) 04/20/2020   Controlled type 2 diabetes mellitus with hyperglycemia (HCC) 04/20/2020   Essential hypertension 04/20/2020   HLD (hyperlipidemia) 04/20/2020   CKD (chronic kidney disease) stage 2, GFR 60-89 ml/min 04/20/2020   Iron deficiency anemia 04/20/2020   Obese 04/19/2020   Chronic back pain 04/19/2020   hx of CVA (cerebral vascular accident) (HCC) 04/16/2020   Macular retinoschisis, 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   Vitreomacular traction syndrome, 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   Depression 12/22/2017   Chronic pain 12/22/2017   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   Chronic diastolic CHF (congestive heart failure) (HCC) 12/22/2017   Acute renal failure superimposed on  stage 2 chronic kidney disease 11/07/2016   AKI (acute kidney injury) 08/04/2016   Frequent falls 07/16/2016   Carotid artery disease 04/16/2013   Coronary artery disease 03/11/2013   Occlusion and stenosis of carotid artery without mention of cerebral infarction 07/19/2011   Past Medical History:  Past Medical History:  Diagnosis Date   Acute kidney injury 08/05/2016   Acute respiratory failure with hypoxia (HCC) 04/19/2020   Acute stroke due to ischemia [facility])    Aftercare following surgery of the circulatory system, NEC 05/11/2013   AKI (acute kidney injury) 08/04/2016   Anxiety    takes Celexa  daily   ARF (acute renal failure) 11/07/2016   Asymptomatic stenosis of right carotid artery 03/22/2016   Back pain    occasionally   Carotid artery disease 04/16/2013   Carotid artery occlusion    Carotid stenosis 11/16/2013   Cataract    left and immature   Coronary artery disease    Coronary atherosclerosis of native coronary artery 03/11/2013   S/p CABG in 1997    Depression    Diabetes mellitus    takes Metformin  and Glipizide  daily   Diabetes mellitus (HCC) 05/02/2015   Dizziness    takes Meclizine  daily as needed   Essential hypertension, benign 03/11/2013   GERD (gastroesophageal reflux disease)    takes Omeprazole daily as needed   Headache(784.0)    Hyperlipidemia    takes Atorvastatin  daily   Hypertension    takes Carvedilol  daily   Mixed hyperlipidemia 03/11/2013   Muscle spasm    takes Robaxin  daily as needed   Nausea    takes Phenergan  daily as needed   Occlusion and stenosis of carotid artery without mention of cerebral infarction 07/19/2011   Pneumonia  hx of-in high school   Restless leg    takes Requip  daily as needed   Seasonal allergies    takes Claritin  daily as needed and Afrin as needed   Severe sepsis (HCC) 02/14/2021   Shortness of breath    with exertion   Urinary urgency    UTI (urinary tract infection) 11/07/2016   Past Surgical  History:  Past Surgical History:  Procedure Laterality Date   COLONOSCOPY WITH PROPOFOL  N/A 03/10/2012   Procedure: COLONOSCOPY WITH PROPOFOL ;  Surgeon: Gladis MARLA Louder, MD;  Location: [facility];  Service: Endoscopy;  Laterality: N/A;   CORNEAL TRANSPLANT Right    CORONARY ARTERY BYPASS GRAFT  1997   x 6   CORONARY ARTERY BYPASS GRAFT  Jan. 1997   ENDARTERECTOMY Left 04/16/2013   Procedure: Left Carotid Artery Endatarectomy with Resection of Redundant Internal Carotid Artery;  Surgeon: Krystal JULIANNA Doing, MD;  Location: [facility] OR;  Service: Vascular;  Laterality: Left;   ENDARTERECTOMY Right 03/22/2016   Procedure: RIGHT ENDARTERECTOMY CAROTID;  Surgeon: Krystal JULIANNA Doing, MD;  Location: [facility] OR;  Service: Vascular;  Laterality: Right;   ESOPHAGOGASTRODUODENOSCOPY N/A 03/10/2012   Procedure: ESOPHAGOGASTRODUODENOSCOPY (EGD);  Surgeon: Gladis MARLA Louder, MD;  Location: [facility];  Service: Endoscopy;  Laterality: N/A;   EYE SURGERY  March 12, 2001   CORNEA TRANSPLANT Right eye   INTRAMEDULLARY (IM) NAIL INTERTROCHANTERIC Right 10/22/2023   Procedure: FIXATION, FRACTURE, INTERTROCHANTERIC, WITH INTRAMEDULLARY ROD;  Surgeon: Reyne Cordella SQUIBB, MD;  Location: [facility] OR;  Service: Orthopedics;  Laterality: Right;   PATCH ANGIOPLASTY Right 03/22/2016   Procedure: PATCH ANGIOPLASTY;  Surgeon: Krystal JULIANNA Doing, MD;  Location: [facility] OR;  Service: Vascular;  Laterality: Right;   PR VEIN BYPASS GRAFT,AORTO-FEM-POP  1997   SPINE SURGERY  march 2013   Back surgery   TONSILLECTOMY     TRIGGER FINGER RELEASE Left    thumb   HPI:  83 y.o. female with medical history significant of hypertension, hyperlipidemia, diabetes, CAD status post CABG, CVA, carotid artery disease, CKD 2, GERD, RLS, chronic diastolic CHF, depression, anxiety, chronic respiratory failure, chronic pain presenting with altered mental status. Patient recently admitted 10/14-10/20 with right hip fracture status post repair and discharged to SNF. MRI multifocal  acute infarcts with dominant involvement of the left cerebellum in the left PICA territory with associated edema causing mild local mass  effect, additional acute infarcts in the right cerebellum, left frontal lobe, bilateral posterior frontal and parietal lobes, right occipital lobe, and left occipital cortex.SLE 2022 revealed mild cog impairments impacted by her level of pain at time of eval. ST did not recommend acute ST but follow up at next venue.   Assessment / Plan / Recommendation Clinical Impression  Assessment significantly limited by pt's lethargy. She kept eye closed (opened on command for 1-2 seconds) and would arouse responding briefly needing frequent tactile cues and voiced frustration. Pt has history of mild cognitive impairments following assessment in 2022 and pt resides at [facility]. Suspect results are unreliable to reflect current cognition due to lethargy. Language appeared functional and speech dysathric however likely impacted by alertness. She did exhibit deficits in orientation of city, situation and month, working memory of 4 words although she did recall correct month after 14 minute delay, intellectual awareness. She accurately stated use of call button for help. ST will follow for further diagnostic evaluation in therapy as her alertness increases and alter goals as needed.    SLP Assessment  SLP Recommendation/Assessment: Patient needs  continued Speech Language Pathology Services SLP Visit Diagnosis: Cognitive communication deficit (R41.841)     Assistance Recommended at Discharge  Frequent or constant Supervision/Assistance  Functional Status Assessment Patient has had a recent decline in their functional status and demonstrates the ability to make significant improvements in function in a reasonable and predictable amount of time.  Frequency and Duration min 2x/week  2 weeks      SLP Evaluation Cognition  Overall Cognitive Status: Difficult to assess (mild cog  impairments from SLE 3 yrs ago, uncertain of current baseline function) Arousal/Alertness: Lethargic Orientation Level: Oriented to person;Oriented to place (hospital, not city) Year: 2026 Month: July Attention:  (need to assess when more alert- decreased b/c very sleepy) Memory: Impaired (0/4 words) Memory Impairment: Decreased recall of new information (did recall month at end of session) Awareness: Impaired Awareness Impairment: Intellectual impairment Problem Solving:  (will assess more when alertness increases, aware of use of call bell) Safety/Judgment:  (TBA)       Comprehension  Auditory Comprehension Overall Auditory Comprehension: Appears within functional limits for tasks assessed Commands:  (followed one step, 50% 2 step suspect impacted by lethargy than language) Visual Recognition/Discrimination Discrimination: Not tested Reading Comprehension Reading Status: Not tested    Expression Expression Primary Mode of Expression: Verbal Verbal Expression Overall Verbal Expression: Appears within functional limits for tasks assessed Initiation: No impairment Level of Generative/Spontaneous Verbalization: Sentence Repetition: No impairment Naming:  (2 animals but falling asleep) Written Expression Written Expression: Not tested   Oral / Motor  Oral Motor/Sensory Function Overall Oral Motor/Sensory Function: Mild impairment Facial ROM: Reduced left;Suspected CN VII (facial) dysfunction Facial Symmetry: Abnormal symmetry left;Suspected CN VII (facial) dysfunction Lingual ROM:  (will assess) Lingual Symmetry: Within Functional Limits Motor Speech Overall Motor Speech:  (assess when pt more alert, was dysarthric but lethargy impacting) Motor Planning: Within functional limits Motor Speech Errors: Not applicable            Andrea Kirk 11/04/2023, 11:56 AM

## 2023-11-04 NOTE — Progress Notes (Signed)
 VASCULAR LAB    Bilateral lower extremity venous duplex has been performed.  See CV proc for preliminary results.   Elnore Cosens, RVT 11/04/2023, 2:51 PM

## 2023-11-04 NOTE — Progress Notes (Signed)
 ANTICOAGULATION CONSULT NOTE  Pharmacy Consult for Heparin  Indication: stroke  Allergies  Allergen Reactions   Latex Other (See Comments), Rash and Hives    tears skin   Codeine Other (See Comments)    Abnormal behavior   Morphine  And Codeine Other (See Comments)    Affects BP and blood sugar.   Cyclobenzaprine Other (See Comments)    MADE PT SICK- pt unsure if med was cyclobenzaprine or methocarbamol     Patient Measurements: Height: 5' 2 (157.5 cm) Weight: 84.1 kg (185 lb 6.5 oz) IBW/kg (Calculated) : 50.1 Heparin  Dosing Weight: 69.1 kg  Vital Signs: Temp: 98 F (36.7 C) (10/28 0000) Temp Source: Oral (10/28 0000) BP: 127/58 (10/28 0000) Pulse Rate: 51 (10/28 0000)  Labs: Recent Labs    11/03/23 1105 11/03/23 1115 11/03/23 1234 11/03/23 1618 11/04/23 0405  HGB 9.5* 9.1*  --   --  8.1*  HCT 28.0* 29.1*  --   --  26.4*  PLT  --  282  --   --  250  HEPARINUNFRC  --   --   --   --  0.10*  CREATININE 2.40* 2.37*  --   --   --   TROPONINIHS  --  779* 841* 713*  --     Estimated Creatinine Clearance: 18.4 mL/min (A) (by C-G formula based on SCr of 2.37 mg/dL (H)).  Assessment: 89 yof with a history of HTN, HLD, DM, CAD s/p CABG, CVA, CAD, CKD, GERD, RLS, HF, depression, anxiety, chronic respiratory failure. Patient is presenting with AMS. Heparin  per pharmacy consult placed for stroke protocol (Risk for DVT 2/2 recent hip surgery, NSTEMI) per neurology request. Patient reportedly on VTE ppx dosed enoxaparin  prior to arrival-unable to determine last dose through [facility] paperwork sent from SNF. Hgb 9.1; plt 282  AM: heparin  level below goal on 850 units/hr. Per RN (drawn ~7h after start), no issues running continuously/pauses or signs/symptoms of bleeding. CBC shows Hgb 9 >8, plts 250  Goal of Therapy:  Heparin  level 0.3-0.5 units/ml Monitor platelets by anticoagulation protocol: Yes   Plan:  No heparin  bolus Increase heparin  infusion to 1000 units/hr Check anti-Xa  level in 8 hours and daily while on heparin  Continue to monitor H&H and platelets  Lynwood Poplar, PharmD, BCPS Clinical Pharmacist 11/04/2023 4:53 AM

## 2023-11-04 NOTE — Progress Notes (Addendum)
 PROGRESS NOTE                                                                                                                                                                                                             Patient Demographics:    Andrea Kirk, is a 83 y.o. female, DOB - 08/27/1940, FMW:[id number]  Outpatient Primary MD for the patient is System, Provider Not In    LOS - 0  Admit date - 11/03/2023    Chief Complaint  Patient presents with   Altered Mental Status       Brief Narrative (HPI from H&P)   83 y.o. female with medical history significant of hypertension, hyperlipidemia, diabetes, CAD status post CABG, CVA, carotid artery disease, CKD 2, GERD, RLS, chronic diastolic CHF, depression, anxiety, chronic respiratory failure, chronic pain presenting with altered mental status.   Patient recently admitted 10/14-10/20 with right hip fracture status post repair and discharged to SNF.   She was doing well until 2 days ago.  In her usual state of health and had been able to ambulate on her operative extremity.     Developed altered mental status starting yesterday.  Had outpatient lab workup showing AKI.  Brought to the ED for further evaluation today.  In the ER workup suggestive of diffuse embolic stroke, AKI, shock liver and mild demand ischemia.  Seen by neurology and cardiology and admitted to the hospital.   ? If all due to dehydration, hypotension >> CVA, LFTs, AKI, NSTEMI.   Subjective:    Andrea Kirk today has, No headache, No chest pain, No abdominal pain - No Nausea, No new weakness tingling or numbness, no SOB, mild R hip post op site pain.   Assessment  & Plan :   Acute encephalopathy due to combination of acute embolic stroke, dehydration with hypotension, UTI, shock liver and AKI. MRI noted, received IV fluids and antibiotics for dehydration and UTI, mentation much improved, mild residual  right-sided weakness, neurology on board.  Continue to monitor.   Acute embolic CVA with right-sided weakness mostly in the right leg.(? Hypotension) [place] team following, due to recent right hip surgery we are also getting right lower extremity venous duplex and bubble echocardiogram, stroke team on board, per cardiology and neurology does not need heparin  drip.  Has familial genetic low HDL levels, will  defer antiplatelet treatment, treatment of stroke and workup of stroke to the neurology team [place] discussed with stroke team on 11/04/2023.   AKI Uremia Elevated anion gap acidosis Due to dehydration improving with IV fluids hold diuretics   CAD NSTEMI Elevated BNP > History of CABG.  Noted to have troponin elevated to 779 and 841 on repeat.  Demand ischemia from dehydration hypotension, also had shock liver and AKI, seen by cardiology, [place] discussed with cardiology no heparin  drip needed.  Neurology will address antiplatelets, she has genetically low HDL.  Chest pain-free.  Follow-up echocardiogram and cardiology recommendations   UTI On Rocephin  follow cultures   Transaminitis Presentation consistent with shock liver, trend is improving, also appears to have undiagnosed cirrhosis per CT imaging, outpatient GI follow-up could have NASH.   Chronic diastolic CHF Stable and compensated in fact was slightly dehydrated.  Neurology on board.   Hypertension - Permissive hypertension as above   Hyperlipidemia - Continue rosuvastatin , genetically low HDL and multiple family members   Carotid artery disease - On statin antiplatelets per neurology   Depression Anxiety - Continue BuSpar  and Celexa  when able   Chronic respiratory failure with hypoxia - Noted  Right hip surgery 3 weeks ago.  Continue PT OT discharge to SNF once better.  Diabetes - SSI  Lab Results  Component Value Date   HGBA1C 5.7 (H) 11/04/2023   CBG (last 3)  Recent Labs    11/03/23 1823 11/03/23 1928  11/04/23 0750  GLUCAP 136* 129* 78            Condition - Extremely Guarded  Family Communication  :  brother [name] [phone]  on 11/04/23  Code Status :  Full  Consults  :  Neuro, Cards  PUD Prophylaxis :     Procedures  :     TTE bubble study-    MRI - 1. 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 2. Additional acute infarcts in the right cerebellum, left frontal lobe, bilateral posterior frontal and parietal lobes, right occipital lobe, and left occipital cortex. 3. No effacement of the fourth ventricle, hydrocephalus, or midline shift. 4. Mild chronic microvascular ischemic changes.   MRA - 1. No acute findings. 2. 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 3. Limited visualization of the proximal left V4 segment, likely related to atherosclerosis and high-grade stenosis seen on prior CTA. 4. Mild atherosclerotic irregularity of the bilateral carotid siphons.  CT A&P -  1. No acute findings in the abdomen/pelvis. 2. Mild nodularity to the liver contour which can be seen with cirrhosis. 3. Stable 2.9 cm simple left renal cyst. No follow-up imaging is recommended. 4. Stable bibasilar opacification right worse than left likely atelectasis. 5. Aortic atherosclerosis. 6. Postsurgical changes with stable subcutaneous hematomas over the right hip. Aortic Atherosclerosis (ICD10-I70.0).   CT chest, A&P -  1. No acute findings or clear explanation for the patient's symptoms. 2. Interval proximal right femoral ORIF with improved alignment of the previously demonstrated intertrochanteric fracture. There are small hematomas within the lateral subcutaneous fat. 3. Dependent airspace opacities at both lung bases, similar to recent prior CT, likely atelectasis. 4. Mild contour irregularity of the liver, potentially due to mild underlying cirrhosis. 5.  Aortic  Atherosclerosis (ICD10-I70.0).       Disposition Plan  :    Status is: Observation   DVT Prophylaxis  :  heparin  injection 5,000 Units Start: 11/04/23 0815 SCD's Start: 11/03/23 1447    Lab Results  Component Value Date   PLT 250 11/04/2023    Diet :  Diet Order             DIET DYS 3 Room service appropriate? Yes; Fluid consistency: Thin  Diet effective now                    Inpatient Medications  Scheduled Meds:   stroke: early stages of recovery book   Does not apply Once   aspirin   81 mg Oral Daily   Chlorhexidine  Gluconate Cloth  6 each Topical Daily   cyanocobalamin   1,000 mcg Oral Once per day on Monday Tuesday Wednesday Thursday Friday   gabapentin   300 mg Oral TID   heparin  injection (subcutaneous)  5,000 Units Subcutaneous Q8H   insulin  aspart  0-15 Units Subcutaneous TID WC   oxybutynin   10 mg Oral Daily   pantoprazole   40 mg Oral Daily   Continuous Infusions:  sodium chloride  100 mL/hr at 11/04/23 0556   cefTRIAXone  (ROCEPHIN )  IV 2 g (11/03/23 2107)   PRN Meds:.acetaminophen  **OR** acetaminophen  (TYLENOL ) oral liquid 160 mg/5 mL **OR** acetaminophen , albuterol , senna-docusate  Antibiotics  :    Anti-infectives (From admission, onward)    Start     Dose/Rate Route Frequency Ordered Stop   11/03/23 2200  cefTRIAXone  (ROCEPHIN ) 2 g in sodium chloride  0.9 % 100 mL IVPB        2 g 200 mL/hr over 30 Minutes Intravenous Every 24 hours 11/03/23 1510     11/03/23 1330  ceFEPIme  (MAXIPIME ) 2 g in sodium chloride  0.9 % 100 mL IVPB        2 g 200 mL/hr over 30 Minutes Intravenous  Once 11/03/23 1330 11/03/23 1530         Objective:   Vitals:   11/04/23 0000 11/04/23 0400 11/04/23 0500 11/04/23 0751  BP: (!) 127/58 133/62  (!) 145/68  Pulse: (!) 51 61  (!) 57  Resp: 18 16  18   Temp: 98 F (36.7 C) 98.1 F (36.7 C)  99.1 F (37.3 C)  TempSrc: Oral   Oral  SpO2: 99% 93%  97%  Weight:   75.9 kg   Height:        Wt Readings from Last  3 Encounters:  11/04/23 75.9 kg  10/21/23 84.1 kg  07/14/23 81.4 kg     Intake/Output Summary (Last 24 hours) at 11/04/2023 1056 Last data filed at 11/04/2023 0501 Gross per 24 hour  Intake 936.14 ml  Output 900 ml  Net 36.14 ml     Physical Exam  Awake Alert, No new F.N deficits, mild comparative right-sided weakness mostly in the right lower leg [place].AT,PERRAL Supple Neck, No JVD,   Symmetrical Chest wall movement, Good air movement bilaterally, CTAB RRR,No Gallops,Rubs or new Murmurs,  +ve B.Sounds, Abd Soft, No tenderness,   No Cyanosis, Clubbing or edema        Data Review:    Recent Labs  Lab 11/03/23 1104 11/03/23 1105 11/03/23 1115 11/04/23 0405  WBC  --   --  9.1 8.0  HGB 9.2* 9.5* 9.1* 8.1*  HCT 27.0* 28.0* 29.1* 26.4*  PLT  --   --  282 250  MCV  --   --  109.4* 109.5*  MCH  --   --  34.2* 33.6  MCHC  --   --  31.3 30.7  RDW  --   --  16.4* 16.5*  LYMPHSABS  --   --  1.2  --   MONOABS  --   --  0.5  --   EOSABS  --   --  0.1  --   BASOSABS  --   --  0.1  --     Recent Labs  Lab 11/03/23 1104 11/03/23 1105 11/03/23 1115 11/03/23 1116 11/03/23 1618 11/04/23 0405  NA 140 140 141  --   --  144  K 4.4 4.4 4.4  --   --  4.3  CL  --  105 103  --   --  108  CO2  --   --  21*  --   --  24  ANIONGAP  --   --  17*  --   --  12  GLUCOSE  --  141* 143*  --   --  74  BUN  --  72* 74*  --   --  58*  CREATININE  --  2.40* 2.37*  --   --  1.52*  AST  --   --  1,001*  --   --  571*  ALT  --   --  437*  --   --  353*  ALKPHOS  --   --  98  --   --  88  BILITOT  --   --  1.6*  --   --  1.2  ALBUMIN  --   --  2.9*  --   --  2.4*  LATICACIDVEN  --  1.2  --   --   --   --   HGBA1C  --   --   --   --   --  5.7*  BNP  --   --   --  673.1*  --   --   MG  --   --   --   --  3.0*  --   CALCIUM   --   --  8.9  --   --  8.9      Recent Labs  Lab 11/03/23 1105 11/03/23 1115 11/03/23 1116 11/03/23 1618 11/04/23 0405  LATICACIDVEN 1.2  --   --   --   --    HGBA1C  --   --   --   --  5.7*  BNP  --   --  673.1*  --   --   MG  --   --   --  3.0*  --   CALCIUM   --  8.9  --   --  8.9    --------------------------------------------------------------------------------------------------------------- Lab Results  Component Value Date   CHOL 66 11/04/2023   HDL 11 (L) 11/04/2023   LDLCALC 32 11/04/2023   TRIG 117 11/04/2023   CHOLHDL 6.0 11/04/2023    Lab Results  Component Value Date   HGBA1C 5.7 (H) 11/04/2023   No results for input(s): TSH, T4TOTAL, FREET4, T3FREE, THYROIDAB in the last 72 hours. Recent Labs    11/04/23 0405  VITAMINB12 1,976*  FOLATE >20.0    Micro Results Recent Results (from the past 240 hours)  Blood Culture (routine x 2)     Status: None (Preliminary result)   Collection Time: 11/03/23 10:26 AM   Specimen: BLOOD RIGHT ARM  Result Value Ref Range Status   Specimen Description BLOOD RIGHT ARM  Final   Special Requests   Final    BOTTLES DRAWN AEROBIC AND ANAEROBIC Blood Culture results may not be optimal due to  an inadequate volume of blood received in culture bottles   Culture   Final    NO GROWTH < 24 HOURS Performed at [facility], [address], [place], [place] [address]    Report Status PENDING  Incomplete  Blood Culture (routine x 2)     Status: None (Preliminary result)   Collection Time: 11/03/23 10:31 AM   Specimen: BLOOD LEFT ARM  Result Value Ref Range Status   Specimen Description BLOOD LEFT ARM  Final   Special Requests   Final    BOTTLES DRAWN AEROBIC AND ANAEROBIC Blood Culture results may not be optimal due to an inadequate volume of blood received in culture bottles   Culture   Final    NO GROWTH < 24 HOURS Performed at [facility], [address] [place], [place] [address]    Report Status PENDING  Incomplete  Resp panel by RT-PCR (RSV, Flu A&B, Covid) Anterior Nasal Swab     Status: None   Collection Time: 11/03/23 10:46 AM   Specimen: Anterior Nasal  Swab  Result Value Ref Range Status   SARS Coronavirus 2 by RT PCR NEGATIVE NEGATIVE Final   Influenza A by PCR NEGATIVE NEGATIVE Final   Influenza B by PCR NEGATIVE NEGATIVE Final    Comment: (NOTE) The Xpert Xpress SARS-CoV-2/FLU/RSV plus assay is intended as an aid in the diagnosis of influenza from Nasopharyngeal swab specimens and should not be used as a sole basis for treatment. Nasal washings and aspirates are unacceptable for Xpert Xpress SARS-CoV-2/FLU/RSV testing.  Fact Sheet for Patients: bloggercourse.com  Fact Sheet for Healthcare Providers: seriousbroker.it  This test is not yet approved or cleared by the [place]  FDA and has been authorized for detection and/or diagnosis of SARS-CoV-2 by FDA under an Emergency Use Authorization (EUA). This EUA will remain in effect (meaning this test can be used) for the duration of the COVID-19 declaration under Section 564(b)(1) of the Act, 21 U.S.C. section 360bbb-3(b)(1), unless the authorization is terminated or revoked.     Resp Syncytial Virus by PCR NEGATIVE NEGATIVE Final    Comment: (NOTE) Fact Sheet for Patients: bloggercourse.com  Fact Sheet for Healthcare Providers: seriousbroker.it  This test is not yet approved or cleared by the [place]  FDA and has been authorized for detection and/or diagnosis of SARS-CoV-2 by FDA under an Emergency Use Authorization (EUA). This EUA will remain in effect (meaning this test can be used) for the duration of the COVID-19 declaration under Section 564(b)(1) of the Act, 21 U.S.C. section 360bbb-3(b)(1), unless the authorization is terminated or revoked.  Performed at [facility], [address] [place], [place] [address]     Radiology Report CT ABDOMEN PELVIS WO CONTRAST Result Date: 11/04/2023 CLINICAL DATA:  Abdominal pain. EXAM: CT ABDOMEN AND PELVIS  WITHOUT CONTRAST TECHNIQUE: Multidetector CT imaging of the abdomen and pelvis was performed following the standard protocol without IV contrast. RADIATION DOSE REDUCTION: This exam was performed according to the departmental dose-optimization program which includes automated exposure control, adjustment of the mA and/or kV according to patient size and/or use of iterative reconstruction technique. COMPARISON:  11/03/2023 FINDINGS: Lower chest: Mild stable cardiomegaly. Median sternotomy wires are present. Evidence of previous CABG. Calcified plaque over the descending thoracic aorta. Bibasilar opacification right worse than left likely atelectasis. Hepatobiliary: Mild nodularity to the liver contour which can be seen with cirrhosis. No focal liver mass. Gallbladder and biliary tree are normal. Pancreas: Normal. Spleen: Normal. Adrenals/Urinary  Tract: Adrenal glands are normal. Kidneys are normal in size without hydronephrosis or nephrolithiasis. Stable 2.9 cm simple left renal cyst over the lower pole. Ureters are unremarkable. Foley catheter is present within a decompressed bladder. Stomach/Bowel: Stomach and small bowel are normal. Appendix is unremarkable. Mild fecal retention throughout the colon which is otherwise unremarkable. Vascular/Lymphatic: Calcified plaque over the abdominal aorta which is normal caliber. Remaining vascular structures are unremarkable. No adenopathy. Reproductive: Uterus and bilateral adnexa are unremarkable. Other: No free fluid or focal inflammatory change. Musculoskeletal: Hardware over the right proximal femur fixating patient's right hip fracture. Stable subcutaneous hematomas over the soft tissues of the right hip with skin staples present compatible patient's recent surgery. IMPRESSION: 1. No acute findings in the abdomen/pelvis. 2. Mild nodularity to the liver contour which can be seen with cirrhosis. 3. Stable 2.9 cm simple left renal cyst. No follow-up imaging is recommended.  4. Stable bibasilar opacification right worse than left likely atelectasis. 5. Aortic atherosclerosis. 6. Postsurgical changes with stable subcutaneous hematomas over the right hip. Aortic Atherosclerosis (ICD10-I70.0). Electronically Signed   By: Toribio Agreste M.D.   On: 11/04/2023 09:16   MR ANGIO HEAD WO CONTRAST Result Date: 11/03/2023 EXAM: MR Angiography Head without intravenous Contrast. 11/03/2023 05:41:36 PM TECHNIQUE: Magnetic resonance angiography images of the head without intravenous contrast. Multiplanar 2D and 3D reformatted images are provided for review. COMPARISON: MRI head and CT head earlier same day and CTA head 04/16/2020. CLINICAL HISTORY: Stroke, follow up. FINDINGS: ANTERIOR CIRCULATION: The intracranial internal carotid arteries are patent bilaterally. There is mild atherosclerotic irregularity of the bilateral carotid siphons. The visualized portions of the anterior cerebral arteries are patent bilaterally. The M1 segments of the MCAs are patent bilaterally. There is slightly limited evaluation of bilateral MCA branches due to artifact; within these limitations, no focal occlusion is identified. No aneurysm. 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 The posterior cerebral arteries are patent proximally; slightly limited evaluation of distal branches due to artifact. The basilar artery is patent. The intracranial vertebral artery on the right is patent to the vertebrobasilar confluence. There is limited visualization of the proximal left V4 segment, likely related to atherosclerosis and high grade stenosis seen on the prior CTA. The superior cerebellar arteries are patent bilaterally. No aneurysm. IMPRESSION: 1. No acute findings. 2. 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 3. Limited visualization of the proximal left V4 segment, likely related to atherosclerosis and high-grade stenosis seen on prior CTA. 4. Mild  atherosclerotic irregularity of the bilateral carotid siphons. Electronically signed by: Donnice Mania MD 11/03/2023 06:50 PM EDT RP Workstation: [id number]   MR BRAIN WO CONTRAST Result Date: 11/03/2023 EXAM: MRI BRAIN WITHOUT CONTRAST 11/03/2023 03:36:35 PM TECHNIQUE: Multiplanar multisequence MRI of the head/brain was performed without the administration of intravenous contrast. COMPARISON: CT head earlier same day. CLINICAL HISTORY: Neuro deficit, acute, stroke suspected. Patient presents for altered mental status. Medical history includes CAD, HTN, HLD, GERD, DM, depression, anxiety, CVA, CKD. She is currently 12 days postop from repair of a right hip fracture. She had postoperative anemia and received 2 units PRBCs. She was discharged 7 days ago. Per EMS, nursing facility reported that she was in her normal state of health 2 days ago. She was reportedly walking on her recently repaired hip. She is alert, oriented and conversant at baseline. She had an acute change in her mental status yesterday. She underwent outpatient lab work yesterday which showed AKI.  FINDINGS: BRAIN AND VENTRICLES: Multiple scattered areas of acute infarct are noted. The dominant region of infarct involves the left cerebellum within the left PICA territory. There are a few additional punctate scattered areas of acute infarct within the right cerebellum. An additional focus of infarct is in the left frontal lobe centered within the left middle frontal gyrus. There are scattered areas of acute infarct within the posterior frontal lobes and parietal lobes bilaterally. An additional infarct is in the posterior and lateral cortex of the right occipital lobe. Additional punctate areas of acute infarct are in the left occipital cortex. Edema within the left cerebellum in the region of infarct resulting in mild local mass effect. No acute intracranial hemorrhage. No effacement of the fourth ventricle. No hydrocephalus. No midline shift. Mild  chronic microvascular ischemic changes. Bilateral lens replacement. The sella is unremarkable. Normal flow voids. ORBITS: No acute abnormality. SINUSES AND MASTOIDS: No acute abnormality. BONES AND SOFT TISSUES: Normal marrow signal. No acute soft tissue abnormality. IMPRESSION: 1. 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 2. Additional acute infarcts in the right cerebellum, left frontal lobe, bilateral posterior frontal and parietal lobes, right occipital lobe, and left occipital cortex. 3. No effacement of the fourth ventricle, hydrocephalus, or midline shift. 4. Mild chronic microvascular ischemic changes. Electronically signed by: Donnice Mania MD 11/03/2023 05:35 PM EDT RP Workstation: [id number]   CT CHEST ABDOMEN PELVIS WO CONTRAST Result Date: 11/03/2023 CLINICAL DATA:  Altered mental status.  Sepsis suspected. EXAM: CT CHEST, ABDOMEN AND PELVIS WITHOUT CONTRAST TECHNIQUE: Multidetector CT imaging of the chest, abdomen and pelvis was performed following the standard protocol without IV contrast. RADIATION DOSE REDUCTION: This exam was performed according to the departmental dose-optimization program which includes automated exposure control, adjustment of the mA and/or kV according to patient size and/or use of iterative reconstruction technique. COMPARISON:  CT of the chest, abdomen and pelvis 10/21/2023. Abdominopelvic CT 05/28/2022. FINDINGS: CT CHEST FINDINGS Cardiovascular: Atherosclerosis of the aorta, great vessels and coronary arteries status post median sternotomy and CABG. The heart is mildly enlarged. No significant pericardial fluid. Mediastinum/Nodes: There are no enlarged mediastinal, hilar or axillary lymph nodes. Small hiatal hernia again noted. The thyroid  gland appears unremarkable. Lungs/Pleura: Dependent pleural thickening bilaterally without significant pleural fluid. No  pneumothorax. Dependent airspace opacities at both lung bases, similar to recent prior CT, likely atelectasis. No confluent airspace disease. Musculoskeletal/Chest wall: No chest wall mass or suspicious osseous findings. Healed median sternotomy. Multilevel spondylosis associated with a mild convex right thoracic scoliosis. CT ABDOMEN AND PELVIS FINDINGS Hepatobiliary: No acute or focal abnormalities are identified on noncontrast imaging. Mild contour irregularity of the liver suggested, potentially due to mild underlying cirrhosis. No evidence of gallstones, gallbladder wall thickening or biliary dilatation. Pancreas: Unremarkable. No pancreatic ductal dilatation or surrounding inflammatory changes. Spleen: Normal in size without focal abnormality. Adrenals/Urinary Tract: Both adrenal glands appear normal. The kidneys appears stable, without evidence of urinary tract calculus or hydronephrosis. No suspicious renal lesions are identified on noncontrast imaging. A cyst projecting posteriorly from the lower pole of the left kidney appears unchanged, measuring 3.1 cm on image 73/5. No specific follow-up imaging recommended. The bladder appears normal for its degree of distention. Stomach/Bowel: No enteric contrast administered. The stomach appears unremarkable for its degree of distension. No evidence of bowel wall thickening, distention or surrounding inflammatory change. Mild colonic diverticulosis. Vascular/Lymphatic: There are no enlarged abdominal or  pelvic lymph nodes. Aortic and branch vessel atherosclerosis without evidence of aneurysm. Reproductive: The uterus and ovaries appear unremarkable. No adnexal mass. Other: No evidence of abdominal wall mass or hernia. No ascites or pneumoperitoneum. Musculoskeletal: Interval proximal right femoral ORIF with improved alignment of the previously demonstrated intertrochanteric fracture. There are small hematomas within the lateral subcutaneous fat, measuring up to 4.3  cm in diameter. No evidence of pelvic hematoma. No new fractures are identified. There are degenerative and postsurgical changes in the lower lumbar spine. IMPRESSION: 1. No acute findings or clear explanation for the patient's symptoms. 2. Interval proximal right femoral ORIF with improved alignment of the previously demonstrated intertrochanteric fracture. There are small hematomas within the lateral subcutaneous fat. 3. Dependent airspace opacities at both lung bases, similar to recent prior CT, likely atelectasis. 4. Mild contour irregularity of the liver, potentially due to mild underlying cirrhosis. 5.  Aortic Atherosclerosis (ICD10-I70.0). Electronically Signed   By: Elsie Perone M.D.   On: 11/03/2023 12:07   CT Head Wo Contrast Result Date: 11/03/2023 EXAM: CT HEAD WITHOUT CONTRAST 11/03/2023 11:29:35 AM TECHNIQUE: CT of the head was performed without the administration of intravenous contrast. Automated exposure control, iterative reconstruction, and/or weight based adjustment of the mA/kV was utilized to reduce the radiation dose to as low as reasonably achievable. COMPARISON: Brain MRI 08/28/2022. Head CT 10/21/2023. CLINICAL HISTORY: 83 year old female. Mental status change, unknown cause. Noted confusion, saying words that don't make sense. Thought to be hyperkalemic. FINDINGS: BRAIN AND VENTRICLES: No acute hemorrhage. Brain volume remains normal for age. Multiple acute to subacute appearing infarcts are present in both cerebral hemispheres and the left cerebellum (largest). Specifically, there is a small new 2 cm area of cytotoxic edema in the anterior left middle frontal gyrus, anterior left MCA territory (series 3 image 12), with no regional mass effect. A larger area of confluent cytotoxic edema is seen in the left cerebellum, left PICA or AICA territory (series 3 image 25). Additionally, there is a small cortical infarct in the right lateral occipital lobe, approximately 1 cm on series 3  image 18. These findings are new from the recent head CT of 10/21/2023. No hemorrhagic transformation. No significant intracranial mass effect. No ventriculomegaly. No hydrocephalus. No extra-axial collection. No midline shift. Calcified atherosclerosis at the skull base. No suspicious intracranial vascular hyperdensity. The pattern of these findings suggests sequelae of a recent embolic event. ORBITS: No acute abnormality. SINUSES: No acute abnormality. SOFT TISSUES AND SKULL: No acute soft tissue abnormality. No skull fracture. IMPRESSION: 1. Multiple acute to subacute appearing infarcts in both cerebral hemispheres and the left cerebellum (largest). Pattern suggests a recent embolic event. 2. No hemorrhagic transformation or intracranial mass effect. Electronically signed by: Helayne Hurst MD 11/03/2023 11:39 AM EDT RP Workstation: [id number]   DG Chest Port 1 View Result Date: 11/03/2023 EXAM: 1 VIEW(S) XRAY OF THE CHEST 11/03/2023 10:37:00 AM COMPARISON: None available. CLINICAL HISTORY: Questionable sepsis - evaluate for abnormality. FINDINGS: LUNGS AND PLEURA: Low lung volumes. Left basilar airspace opacities. Small left pleural effusion. HEART AND MEDIASTINUM: Stable Cardiomegaly. Sternotomy wires and mediastinal surgical clips suggesting CABG. BONES AND SOFT TISSUES: No acute osseous abnormality. IMPRESSION: 1. Left basilar airspace opacities with a small left pleural effusion. Electronically signed by: Oneil Devonshire MD 11/03/2023 11:11 AM EDT RP Workstation: [id number]     Signature  -   Lavada Stank M.D on 11/04/2023 at 10:56 AM   -  To page go to [email or web address]

## 2023-11-04 NOTE — Plan of Care (Signed)
  Problem: Education: Goal: Knowledge of disease or condition will improve Outcome: Not Progressing Goal: Knowledge of secondary prevention will improve (MUST DOCUMENT ALL) Outcome: Not Progressing Goal: Knowledge of patient specific risk factors will improve (DELETE if not current risk factor) Outcome: Not Progressing   Problem: Coping: Goal: Will verbalize positive feelings about self Outcome: Not Progressing  Pt AMS

## 2023-11-04 NOTE — Care Management Obs Status (Signed)
 MEDICARE OBSERVATION STATUS NOTIFICATION   Patient Details  Name: Andrea Kirk MRN: [id number] Date of Birth: [birth year]   Medicare Observation Status Notification Given:  Yes Verbally reviewed observation notice with Elveria Ghent telephonically at [phone].  Will mail a copy to the patient home address.      Hanzel Pizzo 11/04/2023, 2:04 PM

## 2023-11-04 NOTE — Progress Notes (Signed)
 Cardiology Progress Note  Patient ID: Andrea Kirk MRN: [id number] DOB: 1940-07-29 Date of Encounter: 11/04/2023 Primary Cardiologist: Lonni CROME Nanas, MD  Subjective   Chief Complaint: none.   HPI: Awake, following commands.  Reports leg pain.  No chest pain or trouble breathing.  Mental status much improved from yesterday.  ROS:  All other ROS reviewed and negative. Pertinent positives noted in the HPI.     Telemetry  Overnight telemetry shows SR 70s, which I personally reviewed.    Physical Exam   Vitals:   11/04/23 0000 11/04/23 0400 11/04/23 0500 11/04/23 0751  BP: (!) 127/58 133/62  (!) 145/68  Pulse: (!) 51 61  (!) 57  Resp: 18 16  18   Temp: 98 F (36.7 C) 98.1 F (36.7 C)  99.1 F (37.3 C)  TempSrc: Oral   Oral  SpO2: 99% 93%  97%  Weight:   75.9 kg   Height:        Intake/Output Summary (Last 24 hours) at 11/04/2023 0941 Last data filed at 11/04/2023 0501 Gross per 24 hour  Intake 936.14 ml  Output 900 ml  Net 36.14 ml       11/04/2023    5:00 AM 11/03/2023    6:17 PM 10/21/2023    8:39 PM  Last 3 Weights  Weight (lbs) 167 lb 5.3 oz 185 lb 6.5 oz 185 lb 6.5 oz  Weight (kg) 75.9 kg 84.1 kg 84.1 kg    Body mass index is 30.6 kg/m.  General: Well nourished, well developed, in no acute distress Head: Atraumatic, normal size  Eyes: PEERLA, EOMI  Neck: Supple, no JVD Endocrine: No thryomegaly Cardiac: Normal S1, S2; RRR; no murmurs, rubs, or gallops Lungs: Clear to auscultation bilaterally, no wheezing, rhonchi or rales  Abd: Soft, nontender, no hepatomegaly  Ext: No edema, pulses 2+ Musculoskeletal: No deformities, BUE and BLE strength normal and equal Skin: Warm and dry, no rashes   Neuro: Alert and oriented to person, place, time, and situation, CNII-XII grossly intact, no focal deficits  Psych: Normal mood and affect   Cardiac Studies  TTE 10/22/2023  1. Left ventricular ejection fraction, by estimation, is >75%. The left  ventricle  has hyperdynamic function. The left ventricle has no regional  wall motion abnormalities. Left ventricular diastolic parameters are  consistent with Grade II diastolic  dysfunction (pseudonormalization).   2. Right ventricular systolic function is normal. The right ventricular  size is normal. There is severely elevated pulmonary artery systolic  pressure.   3. Left atrial size was moderately dilated.   4. Right atrial size was mildly dilated.   5. The mitral valve is normal in structure. Trivial mitral valve  regurgitation. No evidence of mitral stenosis.   6. The aortic valve is tricuspid. Aortic valve regurgitation is not  visualized. Mild aortic valve stenosis.   7. Aortic dilatation noted. There is borderline dilatation of the  ascending aorta, measuring 39 mm.   8. The inferior vena cava is normal in size with greater than 50%  respiratory variability, suggesting right atrial pressure of 3 mmHg.   Patient Profile  Andrea Kirk is a 83 y.o. female with CAD status post CABG, stroke, diastolic heart failure, hypertension, diabetes, carotid artery disease admitted on 11/03/2023 with encephalopathy.  Found to have acute strokes.  Cardiology consulted for elevated troponin.  Assessment & Plan   # Elevated troponin, demand # CAD status post CABG - Admitted with encephalopathy from facility.  Found to have acute  kidney injury and acute liver injury. - Also here with strokes.  Appear to be embolic. - She was quite encephalopathic yesterday.  Cardiology was consulted for elevated troponin.  She is much more alert today.  She denies any chest pains or trouble breathing. - Unclear what explains her multiorgan system failure however it is improving.  I do not believe this represents an acute coronary syndrome.  I suspect her troponin elevation is secondary to what ever is going on metabolically. - Her troponins have peaked at 841 and trending down.  Liver and kidney function appears to be  improving. - Follow-up echo. - Blood cultures are negative so far.  CT chest without any definitive pneumonia.  Really unclear what explains this picture. - Continue aspirin  for now.  Resume statin when deemed safe.  Liver enzymes seem to be improving. - She has been covered with antibiotics for possible UTI.  # AKI # UTI # Acute liver failure # Sepsis? - Being treated for UTI.  Possibly sepsis explains this picture.  Continue antibiotics per primary team.  # Acute CVA - No A-fib.  Appears to be embolic phenomenon.  Follow-up echo.  Will need A-fib monitoring at home. - Continue aspirin .  Resume statin when deemed safe.  # Elevated BNP - No signs of congestive heart failure.  EKG with diffuse anterior T wave inversions.  Suspect she may end up having a Takotsubo or stress-induced cardiomyopathy.  Will follow-up her echo.  No need for diuresis at this time.     For questions or updates, please contact [facility] Please consult [email or web address] for contact info under        Signed, Darryle T. Barbaraann, MD, [facility]   [facility]  11/04/2023 9:41 AM

## 2023-11-04 NOTE — Progress Notes (Signed)
 Echocardiogram 2D Echocardiogram has been performed.  Andrea Kirk 11/04/2023, 3:56 PM

## 2023-11-04 NOTE — Evaluation (Addendum)
 Physical Therapy Evaluation Patient Details Name: Andrea Kirk MRN: [id number] DOB: 12-Oct-1940 Today's Date: 11/04/2023  History of Present Illness  Pt is an 83 y.o. female presenting 10/27 with AMS. 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   PMHx: HTN, HLD, anxiety, CAD, s/p left CAE in 2015, right CAE in 2018, s/p CABG in 1997, anemia, T2DM, GERD, RLS, and CVA.   Recently admitted 10/14-10/20 with right hip fracture status post repair and discharged to SNF.  Clinical Impression  Pt is presenting at Total to Max A +2 for bed mobility, Min A to CGA sitting EOB; limited by pain in the R hip. Prior to current hospitalizations pt was mostly Mod I with some help from staff. Due to pt current functional status, home set up and available assistance at home recommending skilled physical therapy services < 3 hours/day in order to address strength, balance and functional mobility to decrease risk for falls, injury, immobility, skin break down and re-hospitalization.          If plan is discharge home, recommend the following: Two people to help with walking and/or transfers;Two people to help with bathing/dressing/bathroom;Assistance with cooking/housework;Assist for transportation;Help with stairs or ramp for entrance   Can travel by private vehicle   No    Equipment Recommendations Hospital bed;Hoyer lift     Functional Status Assessment Patient has had a recent decline in their functional status and demonstrates the ability to make significant improvements in function in a reasonable and predictable amount of time.     Precautions / Restrictions Precautions Precautions: Fall Recall of Precautions/Restrictions: Impaired Restrictions Weight Bearing Restrictions Per Provider Order: No RLE Weight Bearing Per Provider Order: Weight bearing as tolerated      Mobility Bed Mobility Overal bed mobility: Needs Assistance Bed Mobility:  Supine to Sit, Rolling Rolling: Max assist, +2 for physical assistance   Supine to sit: +2 for physical assistance, +2 for safety/equipment, Total assist Sit to supine: Max assist, +2 for physical assistance, +2 for safety/equipment   General bed mobility comments: Pt requires Total A to get to EOB and Max A +2 for sitting to supine with assist at trunk and LE    Transfers   General transfer comment: unable to attempt       Balance Overall balance assessment: Needs assistance Sitting-balance support: Single extremity supported, Bilateral upper extremity supported, Feet supported Sitting balance-Leahy Scale: Poor Sitting balance - Comments: Min A to CGA sitting EOB Postural control: Posterior lean       Pertinent Vitals/Pain Pain Assessment Pain Assessment: Faces Faces Pain Scale: Hurts even more Breathing: occasional labored breathing, short period of hyperventilation Negative Vocalization: occasional moan/groan, low speech, negative/disapproving quality Facial Expression: sad, frightened, frown Body Language: tense, distressed pacing, fidgeting Consolability: distracted or reassured by voice/touch PAINAD Score: 5 Pain Location: R hip Pain Descriptors / Indicators: Discomfort, Grimacing, Guarding Pain Intervention(s): Limited activity within patient's tolerance, Monitored during session, Patient requesting pain meds-RN notified    Home Living Family/patient expects to be discharged to:: Skilled nursing facility     Type of Home: Other(Comment) (SNF)             Additional Comments: LTC at [facility], 3 years    Prior Function Prior Level of Function : Independent/Modified Independent             Mobility Comments: Prior to fall on 10/14 pt chart states pt can get out of bed on her own, transfers using  AD, ambulates to the bathroom using rollator, otherwise in a manual w/c. She states staff sometimes helps her with mobility. 1 fall ADLs Comments: Per  previous chart before hospitalization she sponge bathes at baseline. She can get herself dressed, toileted, and groomed. Staff manage meds and meals.     Extremity/Trunk Assessment   Upper Extremity Assessment Upper Extremity Assessment: Defer to OT evaluation    Lower Extremity Assessment Lower Extremity Assessment: Generalized weakness;RLE deficits/detail RLE Deficits / Details: s/p femur IM rod RLE: Unable to fully assess due to pain    Cervical / Trunk Assessment Cervical / Trunk Assessment: Normal  Communication   Communication Communication: No apparent difficulties    Cognition Arousal: Lethargic Behavior During Therapy: Flat affect   PT - Cognitive impairments: No family/caregiver present to determine baseline, Attention, Initiation, Sequencing, Safety/Judgement     PT - Cognition Comments: Pt moaning groaning with movement, anxious asking to lay back down right after sitting up, knew that she was at [facility] Following commands: Impaired Following commands impaired: Follows one step commands with increased time     Cueing Cueing Techniques: Verbal cues, Tactile cues     General Comments General comments (skin integrity, edema, etc.): VSS on 2L O2 via [place] HR 50-60s sitting EOB        Assessment/Plan    PT Assessment Patient needs continued PT services  PT Problem List Decreased strength;Decreased range of motion;Decreased activity tolerance;Decreased balance;Decreased mobility;Decreased coordination;Decreased cognition;Decreased knowledge of use of DME;Decreased safety awareness;Pain       PT Treatment Interventions DME instruction;Gait training;Functional mobility training;Therapeutic activities;Therapeutic exercise;Balance training;Patient/family education;Wheelchair mobility training    PT Goals (Current goals can be found in the Care Plan section)  Acute Rehab PT Goals Patient Stated Goal: Have less pain PT Goal Formulation: With patient Time For Goal  Achievement: 11/06/23 Potential to Achieve Goals: Fair    Frequency Min 2X/week     Co-evaluation PT/OT/SLP Co-Evaluation/Treatment: Yes Reason for Co-Treatment: For patient/therapist safety;To address functional/ADL transfers PT goals addressed during session: Mobility/safety with mobility;Balance;Proper use of DME         AM-PAC PT 6 Clicks Mobility  Outcome Measure Help needed turning from your back to your side while in a flat bed without using bedrails?: Total Help needed moving from lying on your back to sitting on the side of a flat bed without using bedrails?: Total Help needed moving to and from a bed to a chair (including a wheelchair)?: Total Help needed standing up from a chair using your arms (e.g., wheelchair or bedside chair)?: Total Help needed to walk in hospital room?: Total Help needed climbing 3-5 steps with a railing? : Total 6 Click Score: 6    End of Session Equipment Utilized During Treatment: Oxygen  Activity Tolerance: Patient limited by pain Patient left: in bed;with call bell/phone within reach;with bed alarm set Nurse Communication: Mobility status PT Visit Diagnosis: Muscle weakness (generalized) (M62.81);Pain;Other abnormalities of gait and mobility (R26.89);Unsteadiness on feet (R26.81);Difficulty in walking, not elsewhere classified (R26.2) Pain - Right/Left: Right Pain - part of body: Hip    Time: 1152-1211 PT Time Calculation (min) (ACUTE ONLY): 19 min   Charges:   PT Evaluation $PT Eval Low Complexity: 1 Low   PT General Charges $$ ACUTE PT VISIT: 1 Visit         Dorothyann Maier, DPT, CLT  Acute Rehabilitation Services Office: [phone] (Secure chat preferred)   Dorothyann VEAR Maier 11/04/2023, 12:35 PM

## 2023-11-05 DIAGNOSIS — R29701 NIHSS score 1: Secondary | ICD-10-CM

## 2023-11-05 DIAGNOSIS — E1151 Type 2 diabetes mellitus with diabetic peripheral angiopathy without gangrene: Secondary | ICD-10-CM

## 2023-11-05 DIAGNOSIS — R7989 Other specified abnormal findings of blood chemistry: Secondary | ICD-10-CM | POA: Diagnosis not present

## 2023-11-05 DIAGNOSIS — N179 Acute kidney failure, unspecified: Secondary | ICD-10-CM | POA: Diagnosis not present

## 2023-11-05 DIAGNOSIS — G936 Cerebral edema: Secondary | ICD-10-CM | POA: Diagnosis not present

## 2023-11-05 DIAGNOSIS — I639 Cerebral infarction, unspecified: Secondary | ICD-10-CM | POA: Diagnosis not present

## 2023-11-05 DIAGNOSIS — I10 Essential (primary) hypertension: Secondary | ICD-10-CM | POA: Diagnosis not present

## 2023-11-05 DIAGNOSIS — I6340 Cerebral infarction due to embolism of unspecified cerebral artery: Secondary | ICD-10-CM | POA: Diagnosis not present

## 2023-11-05 LAB — CBC WITH DIFFERENTIAL/PLATELET
Abs Immature Granulocytes: 0.05 K/uL (ref 0.00–0.07)
Basophils Absolute: 0.1 K/uL (ref 0.0–0.1)
Basophils Relative: 1 %
Eosinophils Absolute: 0.2 K/uL (ref 0.0–0.5)
Eosinophils Relative: 3 %
HCT: 26.9 % — ABNORMAL LOW (ref 36.0–46.0)
Hemoglobin: 8.4 g/dL — ABNORMAL LOW (ref 12.0–15.0)
Immature Granulocytes: 1 %
Lymphocytes Relative: 21 %
Lymphs Abs: 1.5 K/uL (ref 0.7–4.0)
MCH: 34.3 pg — ABNORMAL HIGH (ref 26.0–34.0)
MCHC: 31.2 g/dL (ref 30.0–36.0)
MCV: 109.8 fL — ABNORMAL HIGH (ref 80.0–100.0)
Monocytes Absolute: 0.6 K/uL (ref 0.1–1.0)
Monocytes Relative: 9 %
Neutro Abs: 4.8 K/uL (ref 1.7–7.7)
Neutrophils Relative %: 65 %
Platelets: 229 K/uL (ref 150–400)
RBC: 2.45 MIL/uL — ABNORMAL LOW (ref 3.87–5.11)
RDW: 16.7 % — ABNORMAL HIGH (ref 11.5–15.5)
WBC: 7.2 K/uL (ref 4.0–10.5)
nRBC: 0.3 % — ABNORMAL HIGH (ref 0.0–0.2)

## 2023-11-05 LAB — MAGNESIUM: Magnesium: 2.4 mg/dL (ref 1.7–2.4)

## 2023-11-05 LAB — COMPREHENSIVE METABOLIC PANEL WITH GFR
ALT: 223 U/L — ABNORMAL HIGH (ref 0–44)
AST: 228 U/L — ABNORMAL HIGH (ref 15–41)
Albumin: 2.3 g/dL — ABNORMAL LOW (ref 3.5–5.0)
Alkaline Phosphatase: 76 U/L (ref 38–126)
Anion gap: 10 (ref 5–15)
BUN: 33 mg/dL — ABNORMAL HIGH (ref 8–23)
CO2: 24 mmol/L (ref 22–32)
Calcium: 8.9 mg/dL (ref 8.9–10.3)
Chloride: 107 mmol/L (ref 98–111)
Creatinine, Ser: 0.99 mg/dL (ref 0.44–1.00)
GFR, Estimated: 57 mL/min — ABNORMAL LOW (ref >60)
Glucose, Bld: 83 mg/dL (ref 70–99)
Potassium: 4.2 mmol/L (ref 3.5–5.1)
Sodium: 141 mmol/L (ref 135–145)
Total Bilirubin: 0.7 mg/dL (ref 0.0–1.2)
Total Protein: 6.1 g/dL — ABNORMAL LOW (ref 6.5–8.1)

## 2023-11-05 LAB — GLUCOSE, CAPILLARY
Glucose-Capillary: 100 mg/dL — ABNORMAL HIGH (ref 70–99)
Glucose-Capillary: 119 mg/dL — ABNORMAL HIGH (ref 70–99)
Glucose-Capillary: 127 mg/dL — ABNORMAL HIGH (ref 70–99)
Glucose-Capillary: 121 mg/dL — ABNORMAL HIGH (ref 70–99)

## 2023-11-05 MED ORDER — CLOPIDOGREL BISULFATE 75 MG PO TABS
75.0000 mg | ORAL_TABLET | Freq: Every day | ORAL | Status: DC
  Administered 2023-11-06 – 2023-11-07 (×2): 75 mg via ORAL
  Filled 2023-11-05 (×2): qty 1

## 2023-11-05 MED ORDER — ACETAMINOPHEN 325 MG PO TABS: 650.0000 mg | ORAL_TABLET | Freq: Four times a day (QID) | ORAL | Status: DC | PRN

## 2023-11-05 MED ORDER — HYDROCODONE-ACETAMINOPHEN 5-325 MG PO TABS
1.0000 | ORAL_TABLET | ORAL | Status: DC | PRN
  Administered 2023-11-05 – 2023-11-07 (×6): 1 via ORAL
  Filled 2023-11-05 (×6): qty 1

## 2023-11-05 NOTE — TOC Initial Note (Signed)
 Transition of Care [facility]) - Initial/Assessment Note    Patient Details  Name: Andrea Kirk MRN: [id number] Date of Birth: 09-24-1940  Transition of Care [facility]) CM/SW Contact:    Inocente GORMAN Kindle, LCSW Phone Number: 11/05/2023, 8:57 AM  Clinical Narrative:                 Patient admitted from [facility] under LTC. CSW will continue to follow.   Expected Discharge Plan: Skilled Nursing Facility Barriers to Discharge: Continued Medical Work up   Patient Goals and CMS Choice            Expected Discharge Plan and Services In-house Referral: Clinical Social Work   Post Acute Care Choice: Skilled Nursing Facility Living arrangements for the past 2 months: Skilled Nursing Facility                                      Prior Living Arrangements/Services Living arrangements for the past 2 months: Skilled Nursing Facility Lives with:: Facility Resident Patient language and need for interpreter reviewed:: Yes Do you feel safe going back to the place where you live?: Yes      Need for Family Participation in Patient Care: Yes (Comment) Care giver support system in place?: Yes (comment)   Criminal Activity/Legal Involvement Pertinent to Current Situation/Hospitalization: No - Comment as needed  Activities of Daily Living   ADL Screening (condition at time of admission) Independently performs ADLs?: No Does the patient have a NEW difficulty with bathing/dressing/toileting/self-feeding that is expected to last >3 days?: Yes (Initiates electronic notice to provider for possible OT consult) Does the patient have a NEW difficulty with getting in/out of bed, walking, or climbing stairs that is expected to last >3 days?: Yes (Initiates electronic notice to provider for possible PT consult) Does the patient have a NEW difficulty with communication that is expected to last >3 days?: No Is the patient deaf or have difficulty hearing?: No Does the patient have difficulty seeing,  even when wearing glasses/contacts?: No (pt AMS) Does the patient have difficulty concentrating, remembering, or making decisions?: Yes  Permission Sought/Granted Permission sought to share information with : Facility Medical Sales Representative, Family Supports Permission granted to share information with : Yes, Verbal Permission Granted  Share Information with NAME: Butner,Dr [name]- Brother [phone]  Permission granted to share info w AGENCY: [facility]        Emotional Assessment Appearance:: Appears stated age     Orientation: : Oriented to Self, Oriented to Place, Oriented to  Time Alcohol  / Substance Use: Not Applicable Psych Involvement: No (comment)  Admission diagnosis:  Elevated troponin [R79.89] Acute encephalopathy [G93.40] AKI (acute kidney injury) [N17.9] Abnormal transaminases [R74.8] Acute CVA (cerebrovascular accident) (HCC) [I63.9] Urinary tract infection without hematuria, site unspecified [N39.0] Patient Active Problem List   Diagnosis Date Noted   Abnormal transaminases 11/03/2023   High anion gap metabolic acidosis 11/03/2023   Uremia 11/03/2023   NSTEMI (non-ST elevated myocardial infarction) (HCC) 11/03/2023   Acute on chronic diastolic CHF (congestive heart failure) (HCC) 11/03/2023   Acute CVA (cerebrovascular accident) (HCC) 11/03/2023   Preop examination 10/22/2023   Fall 10/22/2023   Displaced intertrochanteric fracture of right femur, initial encounter for closed fracture (HCC) 10/21/2023   Chronic respiratory failure with hypoxia (HCC) 10/21/2023   Bilateral sensorineural hearing loss 07/25/2022   History of sepsis 2/2 UTI 05/28/2022   Nausea 02/17/2021   C. difficile colitis  02/15/2021   Elevated troponin 02/14/2021   Gram-negative bacteremia 02/14/2021   Thrombocytopenia 02/13/2021   Acute encephalopathy 02/13/2021   Anxiety and depression 02/13/2021   Closed fracture of acromial process of right scapula 11/21/2020   Syncope, vasovagal  06/23/2020   Pulmonary hypertension, unspecified (HCC) 04/20/2020   Controlled type 2 diabetes mellitus with hyperglycemia (HCC) 04/20/2020   Essential hypertension 04/20/2020   HLD (hyperlipidemia) 04/20/2020   CKD (chronic kidney disease) stage 2, GFR 60-89 ml/min 04/20/2020   Iron deficiency anemia 04/20/2020   Obese 04/19/2020   Chronic back pain 04/19/2020   hx of CVA (cerebral vascular accident) (HCC) 04/16/2020   Macular retinoschisis, 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   Vitreomacular traction syndrome, 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   Depression 12/22/2017   Chronic pain 12/22/2017   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   Chronic diastolic CHF (congestive heart failure) (HCC) 12/22/2017   Acute renal failure superimposed on stage 2 chronic kidney disease 11/07/2016   AKI (acute kidney injury) 08/04/2016   Frequent falls 07/16/2016   Carotid artery disease 04/16/2013   Coronary artery disease 03/11/2013   Occlusion and stenosis of carotid artery without mention of cerebral infarction 07/19/2011   PCP:  System, Provider Not In Pharmacy:   [facility] - [place], [place] - [address] [place] [address] Phone: [phone] Fax: [phone]     Social Drivers of Health (SDOH) Social History: SDOH Screenings   Food Insecurity: Patient Unable To Answer (11/03/2023)  Housing: Unknown (11/03/2023)  Transportation Needs: Patient Unable To Answer (11/03/2023)  Utilities: Patient Unable To Answer (11/03/2023)  Social Connections: Unknown (11/03/2023)  Tobacco Use: Low Risk  (10/22/2023)   SDOH Interventions:     Readmission Risk Interventions     No data to display

## 2023-11-05 NOTE — Progress Notes (Signed)
 PROGRESS NOTE                                                                                                                                                                                                             Patient Demographics:    Andrea Kirk, is a 83 y.o. female, DOB - 11-02-1940, FMW:[id number]  Outpatient Primary MD for the patient is System, Provider Not In    LOS - 1  Admit date - 11/03/2023    Chief Complaint  Patient presents with   Altered Mental Status       Brief Narrative (HPI from H&P)    83 y.o. female with medical history significant of hypertension, hyperlipidemia, diabetes, CAD status post CABG, CVA, carotid artery disease, CKD 2, GERD, RLS, chronic diastolic CHF, depression, anxiety, chronic respiratory failure, chronic pain presenting with altered mental status. - Patient recently admitted 10/14-10/20 with right hip fracture status post repair and discharged to SNF.   She was doing well until 2 days ago.  In her usual state of health and had been able to ambulate on her operative extremity.     Developed altered mental status starting yesterday.  Had outpatient lab workup showing AKI.  Brought to the ED for further evaluation today.  In the ER workup suggestive of diffuse embolic stroke, AKI, shock liver and mild demand ischemia.  Seen by neurology and cardiology and admitted to the hospital.      Subjective:    Andrea Kirk today is any chest pain, shortness of breath, no new weakness, tingling or numbness, she is still complaining of right hip post hip pain.     Assessment  & Plan :   Acute metabolic encephalopathy  - Multifactorial, due to acute CVA, UTI, shock liver and dehydration/AKI . - Mentation continues to improve .   Acute CVA . -MRI: Multifocal acute infarcts with dominant involvement of the left cerebellum in the left PICA territory with associated edema causing mild local  mass effect. Additional acute infarcts in the right cerebellum, left frontal lobe, bilateral posterior frontal and parietal lobes, right occipital lobe, and left occipital cortex. -MRA: No acute findings. 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 Mild atherosclerotic irregularity of the bilateral carotid siphons.  -2D Echo EF >75%, no evidence of intra-atrial shunt -Bilateral LE venous US  -  negative for DVT -Carotid duplex US  - L ICA 40-59% stenosis  -Neurology input greatly appreciated, concern for embolic CVA, plan for loop recorder prior to discharge. -Patient taking Lovenox  post-hip fracture repair prior to admission, now on aspirin  81 mg daily. No DAPT given large cerebellar infarct. Will consider DAPT in a couple of days if neuro stable.  -Therapy recommendations:  SNF   AKI Uremia Elevated anion gap acidosis Due to dehydration improving with IV fluids hold diuretics Continue with IV fluids   CAD Elevated troponins due to demand ischemia/type II NSTEMI Elevated BNP -History of CABG.  Noted to have troponin elevated to 779 and 841 on repeat. -  Demand ischemia from dehydration hypotension, also had shock liver and AKI. - Cardiology input greatly appreciated, no concern for ACS. - Continue with aspirin  for now, add Plavix  when able (for now hold on adding, please review neurorecommendations). - 2D echo with a preserved EF, no evidence of intra-atrial shunt.   UTI Urinary retention On Rocephin  follow cultures, growing E. coli, awaiting sensitivity/susceptibility -Had retention on admission, required Foley catheter insertion, will do a voiding trial when more ambulatory with PT   Transaminitis Presentation consistent with shock liver, trend is improving, also appears to have undiagnosed cirrhosis per CT imaging, outpatient GI follow-up could have NASH. -LFTs trending down  Right hip surgery 3 weeks ago.  Continue PT OT discharge to SNF once better.  - With pain in right  hip postop area, site looks clean, will start on as needed Vicodin   Chronic diastolic CHF Stable and compensated in fact was slightly dehydrated.  Neurology on board.   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  Code Status :  Full  Consults  :  Neuro, Cards  PUD Prophylaxis :     Procedures  :     TTE bubble study-    MRI - 1. 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 2. Additional acute infarcts in the right cerebellum, left frontal lobe, bilateral posterior frontal and parietal lobes, right occipital lobe, and left occipital cortex. 3. No effacement of the fourth ventricle, hydrocephalus, or midline shift. 4. Mild chronic microvascular ischemic changes.   MRA - 1. No acute findings. 2. 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 3. Limited visualization of the proximal left V4 segment, likely related to atherosclerosis and high-grade stenosis seen on prior CTA. 4. Mild atherosclerotic irregularity of the bilateral carotid siphons.  CT A&P -  1. No acute findings in the abdomen/pelvis. 2. Mild nodularity to the liver contour which can be seen with cirrhosis. 3. Stable 2.9 cm simple left renal cyst. No follow-up imaging is recommended. 4. Stable bibasilar  opacification right worse than left likely atelectasis. 5.  Aortic atherosclerosis. 6. Postsurgical changes with stable subcutaneous hematomas over the right hip. Aortic Atherosclerosis (ICD10-I70.0).   CT chest, A&P -  1. No acute findings or clear explanation for the patient's symptoms. 2. Interval proximal right femoral ORIF with improved alignment of the previously demonstrated intertrochanteric fracture. There are small hematomas within the lateral subcutaneous fat. 3. Dependent airspace opacities at both lung bases, similar to recent prior CT, likely atelectasis. 4. Mild contour irregularity of the liver, potentially due to mild underlying cirrhosis. 5.  Aortic Atherosclerosis (ICD10-I70.0).       Disposition Plan  :    Status is: Observation   DVT Prophylaxis  :    heparin  injection 5,000 Units Start: 11/04/23 0815 SCD's Start: 11/03/23 1447    Lab Results  Component Value Date   PLT 229 11/05/2023    Diet :  Diet Order             DIET DYS 3 Room service appropriate? Yes; Fluid consistency: Thin  Diet effective now                    Inpatient Medications  Scheduled Meds:  aspirin   81 mg Oral Daily   Chlorhexidine  Gluconate Cloth  6 each Topical Daily   cyanocobalamin   1,000 mcg Oral Once per day on Monday Tuesday Wednesday Thursday Friday   gabapentin   300 mg Oral BID   heparin  injection (subcutaneous)  5,000 Units Subcutaneous Q8H   insulin  aspart  0-15 Units Subcutaneous TID WC   oxybutynin   10 mg Oral Daily   pantoprazole   40 mg Oral Daily   Continuous Infusions:  cefTRIAXone  (ROCEPHIN )  IV 2 g (11/04/23 2135)   PRN Meds:.acetaminophen  **OR** acetaminophen  (TYLENOL ) oral liquid 160 mg/5 mL **OR** acetaminophen , albuterol , senna-docusate  Antibiotics  :    Anti-infectives (From admission, onward)    Start     Dose/Rate Route Frequency Ordered Stop   11/03/23 2200  cefTRIAXone  (ROCEPHIN ) 2 g in sodium chloride  0.9 % 100 mL IVPB        2 g 200 mL/hr  over 30 Minutes Intravenous Every 24 hours 11/03/23 1510     11/03/23 1330  ceFEPIme  (MAXIPIME ) 2 g in sodium chloride  0.9 % 100 mL IVPB        2 g 200 mL/hr over 30 Minutes Intravenous  Once 11/03/23 1330 11/03/23 1530         Objective:   Vitals:   11/04/23 2000 11/05/23 0008 11/05/23 0400 11/05/23 0800  BP: (!) 137/56 (!) 152/68 (!) 164/80 (!) 143/68  Pulse:  63 (!) 57 (!) 57  Resp: 16 17 17 15   Temp: 98.2 F (36.8 C) 98.3 F (36.8 C) 98.3 F (36.8 C) 97.6 F (36.4 C)  TempSrc:  Oral Oral Oral  SpO2: 95% 97% 98% (!) 88%  Weight:   78.7 kg 75.1 kg  Height:        Wt Readings from Last 3 Encounters:  11/05/23 75.1 kg  10/21/23 84.1 kg  07/14/23 81.4 kg     Intake/Output Summary (Last 24 hours) at 11/05/2023 1041 Last data filed at 11/05/2023 0832 Gross per 24 hour  Intake 1228.93 ml  Output 2450 ml  Net -1221.07 ml     Physical Exam  Awake Alert,no  apparent distress. Right sided weakness at baseline Symmetrical Chest wall movement, Good air movement bilaterally, CTAB RRR,No Gallops,Rubs or new Murmurs, No Parasternal Heave +ve B.Sounds, Abd Soft, No tenderness, No rebound - guarding or rigidity. No  Cyanosis, right hip area with minimal tenderness to palpation, significant dependent ecchymosis  Data Review:    Recent Labs  Lab 11/03/23 1104 11/03/23 1105 11/03/23 1115 11/04/23 0405 11/05/23 0404  WBC  --   --  9.1 8.0 7.2  HGB 9.2* 9.5* 9.1* 8.1* 8.4*  HCT 27.0* 28.0* 29.1* 26.4* 26.9*  PLT  --   --  282 250 229  MCV  --   --  109.4* 109.5* 109.8*  MCH  --   --  34.2* 33.6 34.3*  MCHC  --   --  31.3 30.7 31.2  RDW  --   --  16.4* 16.5* 16.7*  LYMPHSABS  --   --  1.2  --  1.5  MONOABS  --   --  0.5  --  0.6  EOSABS  --   --  0.1  --  0.2  BASOSABS  --   --  0.1  --  0.1    Recent Labs  Lab 11/03/23 1104 11/03/23 1105 11/03/23 1115 11/03/23 1116 11/03/23 1618 11/04/23 0405 11/05/23 0404  NA 140 140 141  --   --  144 141  K 4.4 4.4  4.4  --   --  4.3 4.2  CL  --  105 103  --   --  108 107  CO2  --   --  21*  --   --  24 24  ANIONGAP  --   --  17*  --   --  12 10  GLUCOSE  --  141* 143*  --   --  74 83  BUN  --  72* 74*  --   --  58* 33*  CREATININE  --  2.40* 2.37*  --   --  1.52* 0.99  AST  --   --  1,001*  --   --  571* 228*  ALT  --   --  437*  --   --  353* 223*  ALKPHOS  --   --  98  --   --  88 76  BILITOT  --   --  1.6*  --   --  1.2 0.7  ALBUMIN  --   --  2.9*  --   --  2.4* 2.3*  LATICACIDVEN  --  1.2  --   --   --   --   --   HGBA1C  --   --   --   --   --  5.7*  --   BNP  --   --   --  673.1*  --   --   --   MG  --   --   --   --  3.0*  --  2.4  CALCIUM   --   --  8.9  --   --  8.9 8.9      Recent Labs  Lab 11/03/23 1105 11/03/23 1115 11/03/23 1116 11/03/23 1618 11/04/23 0405 11/05/23 0404  LATICACIDVEN 1.2  --   --   --   --   --   HGBA1C  --   --   --   --  5.7*  --   BNP  --   --  673.1*  --   --   --   MG  --   --   --  3.0*  --  2.4  CALCIUM   --  8.9  --   --  8.9 8.9    --------------------------------------------------------------------------------------------------------------- Lab  Results  Component Value Date   CHOL 66 11/04/2023   HDL 11 (L) 11/04/2023   LDLCALC 32 11/04/2023   TRIG 117 11/04/2023   CHOLHDL 6.0 11/04/2023    Lab Results  Component Value Date   HGBA1C 5.7 (H) 11/04/2023   No results for input(s): TSH, T4TOTAL, FREET4, T3FREE, THYROIDAB in the last 72 hours. Recent Labs    11/04/23 0405  VITAMINB12 1,976*  FOLATE >20.0    Micro Results Recent Results (from the past 240 hours)  Blood Culture (routine x 2)     Status: None (Preliminary result)   Collection Time: 11/03/23 10:26 AM   Specimen: BLOOD RIGHT ARM  Result Value Ref Range Status   Specimen Description BLOOD RIGHT ARM  Final   Special Requests   Final    BOTTLES DRAWN AEROBIC AND ANAEROBIC Blood Culture results may not be optimal due to an inadequate volume of blood received in  culture bottles   Culture   Final    NO GROWTH 2 DAYS Performed at [facility], [address], [place], [place] [address]    Report Status PENDING  Incomplete  Blood Culture (routine x 2)     Status: None (Preliminary result)   Collection Time: 11/03/23 10:31 AM   Specimen: BLOOD LEFT ARM  Result Value Ref Range Status   Specimen Description BLOOD LEFT ARM  Final   Special Requests   Final    BOTTLES DRAWN AEROBIC AND ANAEROBIC Blood Culture results may not be optimal due to an inadequate volume of blood received in culture bottles   Culture   Final    NO GROWTH 2 DAYS Performed at [facility], [address] [place], [place] [address]    Report Status PENDING  Incomplete  Resp panel by RT-PCR (RSV, Flu A&B, Covid) Anterior Nasal Swab     Status: None   Collection Time: 11/03/23 10:46 AM   Specimen: Anterior Nasal Swab  Result Value Ref Range Status   SARS Coronavirus 2 by RT PCR NEGATIVE NEGATIVE Final   Influenza A by PCR NEGATIVE NEGATIVE Final   Influenza B by PCR NEGATIVE NEGATIVE Final    Comment: (NOTE) The Xpert Xpress SARS-CoV-2/FLU/RSV plus assay is intended as an aid in the diagnosis of influenza from Nasopharyngeal swab specimens and should not be used as a sole basis for treatment. Nasal washings and aspirates are unacceptable for Xpert Xpress SARS-CoV-2/FLU/RSV testing.  Fact Sheet for Patients: bloggercourse.com  Fact Sheet for Healthcare Providers: seriousbroker.it  This test is not yet approved or cleared by the [place]  FDA and has been authorized for detection and/or diagnosis of SARS-CoV-2 by FDA under an Emergency Use Authorization (EUA). This EUA will remain in effect (meaning this test can be used) for the duration of the COVID-19 declaration under Section 564(b)(1) of the Act, 21 U.S.C. section 360bbb-3(b)(1), unless the authorization is terminated or revoked.     Resp  Syncytial Virus by PCR NEGATIVE NEGATIVE Final    Comment: (NOTE) Fact Sheet for Patients: bloggercourse.com  Fact Sheet for Healthcare Providers: seriousbroker.it  This test is not yet approved or cleared by the [place]  FDA and has been authorized for detection and/or diagnosis of SARS-CoV-2 by FDA under an Emergency Use Authorization (EUA). This EUA will remain in effect (meaning this test can be used) for the duration of the COVID-19 declaration under Section 564(b)(1) of the Act, 21 U.S.C. section 360bbb-3(b)(1), unless the authorization is terminated or revoked.  Performed  at [facility], [address] [place], [place] [address]   Urine Culture     Status: Abnormal (Preliminary result)   Collection Time: 11/03/23 12:00 PM   Specimen: Urine, Random  Result Value Ref Range Status   Specimen Description URINE, RANDOM  Final   Special Requests NONE Reflexed from [id number]  Final   Culture (A)  Final    >=100,000 COLONIES/mL GRAM NEGATIVE RODS CULTURE REINCUBATED FOR BETTER GROWTH Performed at [facility], [address] [place], [place] [address]    Report Status PENDING  Incomplete    Radiology Report ECHOCARDIOGRAM LIMITED BUBBLE STUDY Result Date: 11/04/2023    ECHOCARDIOGRAM LIMITED REPORT   Patient Name:   VARNELL DONATE Symes Date of Exam: 11/04/2023 Medical Rec #:  [id number]     Height:       62.0 in Accession #:    [id number]    Weight:       167.3 lb Date of Birth:  [birth year]      BSA:          1.772 m Patient Age:    83 years      BP:           111/82 mmHg Patient Gender: F             HR:           60 bpm. Exam Location:  Inpatient Procedure: Color Doppler, Saline Contrast Bubble Study, Limited Echo and Cardiac            Doppler (Both Spectral and Color Flow Doppler were utilized during            procedure). Indications:    Stroke  History:        Patient has prior history of Echocardiogram examinations,  most                 recent 10/22/2023. CAD and Previous Myocardial Infarction,                 Stroke, Signs/Symptoms:Syncope; Risk Factors:Hypertension and                 Dyslipidemia.  Sonographer:    Juliene Rucks Referring Phys: [id number] ZANE ADAMS  Sonographer Comments: Patient is obese. Image acquisition challenging due to patient body habitus. IMPRESSIONS  1. Left ventricular ejection fraction, by estimation, is 65 to 70%. The left ventricle has normal function. The left ventricle has no regional wall motion abnormalities. Left ventricular diastolic parameters are indeterminate.  2. Right ventricular systolic function is mildly reduced. The right ventricular size is mildly enlarged.  3. The aortic valve is tricuspid. There is mild calcification of the aortic valve. Aortic valve regurgitation is not visualized. Aortic valve sclerosis is present, with no evidence of aortic valve stenosis.  4. Agitated saline contrast bubble study was negative, with no evidence of any interatrial shunt. Comparison(s): No significant change from prior study. Prior images reviewed side by side. FINDINGS  Left Ventricle: Left ventricular ejection fraction, by estimation, is 65 to 70%. The left ventricle has normal function. The left ventricle has no regional wall motion abnormalities. The left ventricular internal cavity size was normal in size. There is  no left ventricular hypertrophy. Left ventricular diastolic parameters are indeterminate. Right Ventricle: RV Strain pattern. The right ventricular size is mildly enlarged. Right ventricular systolic function is mildly reduced. Pericardium: Trivial pericardial effusion is present. The pericardial effusion is surrounding the apex. Tricuspid Valve: The tricuspid  valve is normal in structure. Tricuspid valve regurgitation is mild . No evidence of tricuspid stenosis. Aortic Valve: The aortic valve is tricuspid. There is mild calcification of the aortic valve. Aortic valve  regurgitation is not visualized. Aortic valve sclerosis is present, with no evidence of aortic valve stenosis. Pulmonic Valve: The pulmonic valve was grossly normal. Pulmonic valve regurgitation is not visualized. No evidence of pulmonic stenosis. IAS/Shunts: Agitated saline contrast was given intravenously to evaluate for intracardiac shunting. Agitated saline contrast bubble study was negative, with no evidence of any interatrial shunt. LEFT VENTRICLE PLAX 2D LVIDd:         4.80 cm LVIDs:         3.30 cm LV PW:         0.90 cm LV IVS:        0.90 cm LVOT diam:     1.90 cm LVOT Area:     2.84 cm  LEFT ATRIUM         Index LA diam:    3.30 cm 1.86 cm/m   AORTA Ao Root diam: 3.00 cm MITRAL VALVE MV Area (PHT): 3.91 cm     SHUNTS MV Decel Time: 194 msec     Systemic Diam: 1.90 cm MV E velocity: 118.00 cm/s MV A velocity: 64.50 cm/s MV E/A ratio:  1.83 Stanly Leavens MD Electronically signed by Stanly Leavens MD Signature Date/Time: 11/04/2023/5:27:13 PM    Final    VAS US  LOWER EXTREMITY VENOUS (DVT) Result Date: 11/04/2023  Lower Venous DVT Study Patient Name:  DULCEMARIA BULA Boakye  Date of Exam:   11/04/2023 Medical Rec #: [id number]      Accession #:    [id number] Date of Birth: June 20, 1940       Patient Gender: F Patient Age:   65 years Exam Location:  [facility] Procedure:      VAS US  LOWER EXTREMITY VENOUS (DVT) Referring Phys: MARSA MELVIN --------------------------------------------------------------------------------  Indications: Stroke.  Limitations: Patient unable to rotate legs secondary to altered mental status, somnolence. Comparison Study: No prior study on file Performing Technologist: Alberta Lis RVS  Examination Guidelines: A complete evaluation includes B-mode imaging, spectral Doppler, color Doppler, and power Doppler as needed of all accessible portions of each vessel. Bilateral testing is considered an integral part of a complete examination. Limited examinations for  reoccurring indications may be performed as noted. The reflux portion of the exam is performed with the patient in reverse Trendelenburg.  +---------+---------------+---------+-----------+----------+-------------------+ RIGHT    CompressibilityPhasicitySpontaneityPropertiesThrombus Aging      +---------+---------------+---------+-----------+----------+-------------------+ CFV      Full           Yes      No                                       +---------+---------------+---------+-----------+----------+-------------------+ SFJ      Full                                                             +---------+---------------+---------+-----------+----------+-------------------+ FV Prox  Full           Yes      No                                       +---------+---------------+---------+-----------+----------+-------------------+  FV Mid   Full                                                             +---------+---------------+---------+-----------+----------+-------------------+ FV DistalFull           Yes      No                                       +---------+---------------+---------+-----------+----------+-------------------+ PFV      Full           Yes      No                                       +---------+---------------+---------+-----------+----------+-------------------+ POP                     Yes      No                   patent by color and                                                       Doppler             +---------+---------------+---------+-----------+----------+-------------------+ PTV      Full                                                             +---------+---------------+---------+-----------+----------+-------------------+ PERO     Full                                                             +---------+---------------+---------+-----------+----------+-------------------+    +---------+---------------+---------+-----------+----------+-------------------+ LEFT     CompressibilityPhasicitySpontaneityPropertiesThrombus Aging      +---------+---------------+---------+-----------+----------+-------------------+ CFV      Full           Yes      No                                       +---------+---------------+---------+-----------+----------+-------------------+ SFJ      Full                                                             +---------+---------------+---------+-----------+----------+-------------------+ FV Prox  Full  Yes      Yes                                      +---------+---------------+---------+-----------+----------+-------------------+ FV Mid   Full                                                             +---------+---------------+---------+-----------+----------+-------------------+ FV Distal               Yes      Yes                  patent by color and                                                       Doppler             +---------+---------------+---------+-----------+----------+-------------------+ PFV      Full           Yes      No                                       +---------+---------------+---------+-----------+----------+-------------------+ POP                                                   patent by color and                                                       Doppler             +---------+---------------+---------+-----------+----------+-------------------+ PTV      Full                                                             +---------+---------------+---------+-----------+----------+-------------------+ PERO     Full                                                             +---------+---------------+---------+-----------+----------+-------------------+     Summary: RIGHT: - No evidence of deep vein thrombosis in the lower extremity. No  indirect evidence of obstruction proximal to the inguinal ligament.  - No cystic structure found in the popliteal fossa.  LEFT: - No evidence of deep vein thrombosis in the lower extremity. No indirect evidence of obstruction  proximal to the inguinal ligament.  - No cystic structure found in the popliteal fossa.  *See table(s) above for measurements and observations. Electronically signed by Debby Robertson on 11/04/2023 at 4:40:26 PM.    Final    VAS US  CAROTID Result Date: 11/04/2023 Carotid Arterial Duplex Study Patient Name:  MARKEDA NARVAEZ Lallier  Date of Exam:   11/04/2023 Medical Rec #: [id number]      Accession #:    [id number] Date of Birth: February 28, 1940       Patient Gender: F Patient Age:   63 years Exam Location:  [facility] Procedure:      VAS US  CAROTID Referring Phys: DEVON SHAFER --------------------------------------------------------------------------------  Indications:       Right endarterectomy 03/22/16. Left endarterectomy 04/17/23. Risk Factors:      Hypertension, hyperlipidemia, Diabetes, no history of                    smoking. Comparison Study:  Prior carotid duplex done 01/31/23 indicating 1-39% right ICA                    stenosis and 40-59% left ICA stenosis Performing Technologist: Alberta Lis RVS  Examination Guidelines: A complete evaluation includes B-mode imaging, spectral Doppler, color Doppler, and power Doppler as needed of all accessible portions of each vessel. Bilateral testing is considered an integral part of a complete examination. Limited examinations for reoccurring indications may be performed as noted.  Right Carotid Findings: +----------+--------+--------+--------+------------------+---------+           PSV cm/sEDV cm/sStenosisPlaque DescriptionComments  +----------+--------+--------+--------+------------------+---------+ CCA Prox  165     16                                          +----------+--------+--------+--------+------------------+---------+ CCA  Distal84      13                                          +----------+--------+--------+--------+------------------+---------+ ICA Prox  62      8       1-39%   heterogenous      Shadowing +----------+--------+--------+--------+------------------+---------+ ICA Mid   75      15                                tortuous  +----------+--------+--------+--------+------------------+---------+ ICA Distal92      20                                tortuous  +----------+--------+--------+--------+------------------+---------+ ECA       317     15      >50%                                +----------+--------+--------+--------+------------------+---------+ +----------+--------+-------+--------+-------------------+           PSV cm/sEDV cmsDescribeArm Pressure (mmHG) +----------+--------+-------+--------+-------------------+ [id number]                                        +----------+--------+-------+--------+-------------------+ +---------+--------+--+--------+--+---------+ VertebralPSV  cm/s48EDV cm/s12Antegrade +---------+--------+--+--------+--+---------+  Left Carotid Findings: +----------+--------+--------+--------+------------------+---------+           PSV cm/sEDV cm/sStenosisPlaque DescriptionComments  +----------+--------+--------+--------+------------------+---------+ CCA Prox  122     12              calcific                    +----------+--------+--------+--------+------------------+---------+ CCA Distal95      15              heterogenous                +----------+--------+--------+--------+------------------+---------+ ICA Prox  211     32      40-59%  heterogenous      Shadowing +----------+--------+--------+--------+------------------+---------+ ICA Mid   147     32                                tortuous  +----------+--------+--------+--------+------------------+---------+ ICA Distal90      21                                           +----------+--------+--------+--------+------------------+---------+ ECA       161     15                                          +----------+--------+--------+--------+------------------+---------+ +----------+--------+--------+--------+-------------------+           PSV cm/sEDV cm/sDescribeArm Pressure (mmHG) +----------+--------+--------+--------+-------------------+ [id number]                                         +----------+--------+--------+--------+-------------------+ +---------+--------+--+--------+--+--------+ VertebralPSV cm/s32EDV cm/s10Atypical +---------+--------+--+--------+--+--------+   Summary: Right Carotid: Velocities in the right ICA are consistent with a 1-39% stenosis. Left Carotid: Velocities in the left ICA are consistent with a 40-59% stenosis.               Post stenotic waveform noted proximal ICA, unable to match               previous diastolic velocities. Vertebrals:  Right vertebral artery demonstrates antegrade flow. The left              vertebral artery exhibits an atypical waveform. Subclavians: Normal flow hemodynamics were seen in bilateral subclavian              arteries. *See table(s) above for measurements and observations.     Preliminary    CT ABDOMEN PELVIS WO CONTRAST Result Date: 11/04/2023 CLINICAL DATA:  Abdominal pain. EXAM: CT ABDOMEN AND PELVIS WITHOUT CONTRAST TECHNIQUE: Multidetector CT imaging of the abdomen and pelvis was performed following the standard protocol without IV contrast. RADIATION DOSE REDUCTION: This exam was performed according to the departmental dose-optimization program which includes automated exposure control, adjustment of the mA and/or kV according to patient size and/or use of iterative reconstruction technique. COMPARISON:  11/03/2023 FINDINGS: Lower chest: Mild stable cardiomegaly. Median sternotomy wires are present. Evidence of previous CABG. Calcified plaque over the descending  thoracic aorta. Bibasilar opacification right worse than left likely atelectasis. Hepatobiliary: Mild nodularity to the liver contour which can be seen with cirrhosis. No focal  liver mass. Gallbladder and biliary tree are normal. Pancreas: Normal. Spleen: Normal. Adrenals/Urinary Tract: Adrenal glands are normal. Kidneys are normal in size without hydronephrosis or nephrolithiasis. Stable 2.9 cm simple left renal cyst over the lower pole. Ureters are unremarkable. Foley catheter is present within a decompressed bladder. Stomach/Bowel: Stomach and small bowel are normal. Appendix is unremarkable. Mild fecal retention throughout the colon which is otherwise unremarkable. Vascular/Lymphatic: Calcified plaque over the abdominal aorta which is normal caliber. Remaining vascular structures are unremarkable. No adenopathy. Reproductive: Uterus and bilateral adnexa are unremarkable. Other: No free fluid or focal inflammatory change. Musculoskeletal: Hardware over the right proximal femur fixating patient's right hip fracture. Stable subcutaneous hematomas over the soft tissues of the right hip with skin staples present compatible patient's recent surgery. IMPRESSION: 1. No acute findings in the abdomen/pelvis. 2. Mild nodularity to the liver contour which can be seen with cirrhosis. 3. Stable 2.9 cm simple left renal cyst. No follow-up imaging is recommended. 4. Stable bibasilar opacification right worse than left likely atelectasis. 5. Aortic atherosclerosis. 6. Postsurgical changes with stable subcutaneous hematomas over the right hip. Aortic Atherosclerosis (ICD10-I70.0). Electronically Signed   By: Toribio Agreste M.D.   On: 11/04/2023 09:16   MR ANGIO HEAD WO CONTRAST Result Date: 11/03/2023 EXAM: MR Angiography Head without intravenous Contrast. 11/03/2023 05:41:36 PM TECHNIQUE: Magnetic resonance angiography images of the head without intravenous contrast. Multiplanar 2D and 3D reformatted images are provided for  review. COMPARISON: MRI head and CT head earlier same day and CTA head 04/16/2020. CLINICAL HISTORY: Stroke, follow up. FINDINGS: ANTERIOR CIRCULATION: The intracranial internal carotid arteries are patent bilaterally. There is mild atherosclerotic irregularity of the bilateral carotid siphons. The visualized portions of the anterior cerebral arteries are patent bilaterally. The M1 segments of the MCAs are patent bilaterally. There is slightly limited evaluation of bilateral MCA branches due to artifact; within these limitations, no focal occlusion is identified. No aneurysm. 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 The posterior cerebral arteries are patent proximally; slightly limited evaluation of distal branches due to artifact. The basilar artery is patent. The intracranial vertebral artery on the right is patent to the vertebrobasilar confluence. There is limited visualization of the proximal left V4 segment, likely related to atherosclerosis and high grade stenosis seen on the prior CTA. The superior cerebellar arteries are patent bilaterally. No aneurysm. IMPRESSION: 1. No acute findings. 2. 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 3. Limited visualization of the proximal left V4 segment, likely related to atherosclerosis and high-grade stenosis seen on prior CTA. 4. Mild atherosclerotic irregularity of the bilateral carotid siphons. Electronically signed by: Donnice Mania MD 11/03/2023 06:50 PM EDT RP Workstation: [id number]   MR BRAIN WO CONTRAST Result Date: 11/03/2023 EXAM: MRI BRAIN WITHOUT CONTRAST 11/03/2023 03:36:35 PM TECHNIQUE: Multiplanar multisequence MRI of the head/brain was performed without the administration of intravenous contrast. COMPARISON: CT head earlier same day. CLINICAL HISTORY: Neuro deficit, acute, stroke suspected. Patient presents for altered mental status. Medical history includes CAD, HTN, HLD, GERD, DM,  depression, anxiety, CVA, CKD. She is currently 12 days postop from repair of a right hip fracture. She had postoperative anemia and received 2 units PRBCs. She was discharged 7 days ago. Per EMS, nursing facility reported that she was in her normal state of health 2 days ago. She was reportedly walking on her recently repaired hip. She is alert, oriented and conversant at baseline. She had an acute change in  her mental status yesterday. She underwent outpatient lab work yesterday which showed AKI. FINDINGS: BRAIN AND VENTRICLES: Multiple scattered areas of acute infarct are noted. The dominant region of infarct involves the left cerebellum within the left PICA territory. There are a few additional punctate scattered areas of acute infarct within the right cerebellum. An additional focus of infarct is in the left frontal lobe centered within the left middle frontal gyrus. There are scattered areas of acute infarct within the posterior frontal lobes and parietal lobes bilaterally. An additional infarct is in the posterior and lateral cortex of the right occipital lobe. Additional punctate areas of acute infarct are in the left occipital cortex. Edema within the left cerebellum in the region of infarct resulting in mild local mass effect. No acute intracranial hemorrhage. No effacement of the fourth ventricle. No hydrocephalus. No midline shift. Mild chronic microvascular ischemic changes. Bilateral lens replacement. The sella is unremarkable. Normal flow voids. ORBITS: No acute abnormality. SINUSES AND MASTOIDS: No acute abnormality. BONES AND SOFT TISSUES: Normal marrow signal. No acute soft tissue abnormality. IMPRESSION: 1. 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 2. Additional acute infarcts in the right cerebellum, left frontal lobe, bilateral posterior frontal and parietal lobes, right occipital  lobe, and left occipital cortex. 3. No effacement of the fourth ventricle, hydrocephalus, or midline shift. 4. Mild chronic microvascular ischemic changes. Electronically signed by: Donnice Mania MD 11/03/2023 05:35 PM EDT RP Workstation: [id number]   CT CHEST ABDOMEN PELVIS WO CONTRAST Result Date: 11/03/2023 CLINICAL DATA:  Altered mental status.  Sepsis suspected. EXAM: CT CHEST, ABDOMEN AND PELVIS WITHOUT CONTRAST TECHNIQUE: Multidetector CT imaging of the chest, abdomen and pelvis was performed following the standard protocol without IV contrast. RADIATION DOSE REDUCTION: This exam was performed according to the departmental dose-optimization program which includes automated exposure control, adjustment of the mA and/or kV according to patient size and/or use of iterative reconstruction technique. COMPARISON:  CT of the chest, abdomen and pelvis 10/21/2023. Abdominopelvic CT 05/28/2022. FINDINGS: CT CHEST FINDINGS Cardiovascular: Atherosclerosis of the aorta, great vessels and coronary arteries status post median sternotomy and CABG. The heart is mildly enlarged. No significant pericardial fluid. Mediastinum/Nodes: There are no enlarged mediastinal, hilar or axillary lymph nodes. Small hiatal hernia again noted. The thyroid  gland appears unremarkable. Lungs/Pleura: Dependent pleural thickening bilaterally without significant pleural fluid. No pneumothorax. Dependent airspace opacities at both lung bases, similar to recent prior CT, likely atelectasis. No confluent airspace disease. Musculoskeletal/Chest wall: No chest wall mass or suspicious osseous findings. Healed median sternotomy. Multilevel spondylosis associated with a mild convex right thoracic scoliosis. CT ABDOMEN AND PELVIS FINDINGS Hepatobiliary: No acute or focal abnormalities are identified on noncontrast imaging. Mild contour irregularity of the liver suggested, potentially due to mild underlying cirrhosis. No evidence of gallstones,  gallbladder wall thickening or biliary dilatation. Pancreas: Unremarkable. No pancreatic ductal dilatation or surrounding inflammatory changes. Spleen: Normal in size without focal abnormality. Adrenals/Urinary Tract: Both adrenal glands appear normal. The kidneys appears stable, without evidence of urinary tract calculus or hydronephrosis. No suspicious renal lesions are identified on noncontrast imaging. A cyst projecting posteriorly from the lower pole of the left kidney appears unchanged, measuring 3.1 cm on image 73/5. No specific follow-up imaging recommended. The bladder appears normal for its degree of distention. Stomach/Bowel: No enteric contrast administered. The stomach appears unremarkable for its degree of distension. No evidence of bowel wall thickening, distention or  surrounding inflammatory change. Mild colonic diverticulosis. Vascular/Lymphatic: There are no enlarged abdominal or pelvic lymph nodes. Aortic and branch vessel atherosclerosis without evidence of aneurysm. Reproductive: The uterus and ovaries appear unremarkable. No adnexal mass. Other: No evidence of abdominal wall mass or hernia. No ascites or pneumoperitoneum. Musculoskeletal: Interval proximal right femoral ORIF with improved alignment of the previously demonstrated intertrochanteric fracture. There are small hematomas within the lateral subcutaneous fat, measuring up to 4.3 cm in diameter. No evidence of pelvic hematoma. No new fractures are identified. There are degenerative and postsurgical changes in the lower lumbar spine. IMPRESSION: 1. No acute findings or clear explanation for the patient's symptoms. 2. Interval proximal right femoral ORIF with improved alignment of the previously demonstrated intertrochanteric fracture. There are small hematomas within the lateral subcutaneous fat. 3. Dependent airspace opacities at both lung bases, similar to recent prior CT, likely atelectasis. 4. Mild contour irregularity of the liver,  potentially due to mild underlying cirrhosis. 5.  Aortic Atherosclerosis (ICD10-I70.0). Electronically Signed   By: Elsie Perone M.D.   On: 11/03/2023 12:07   CT Head Wo Contrast Result Date: 11/03/2023 EXAM: CT HEAD WITHOUT CONTRAST 11/03/2023 11:29:35 AM TECHNIQUE: CT of the head was performed without the administration of intravenous contrast. Automated exposure control, iterative reconstruction, and/or weight based adjustment of the mA/kV was utilized to reduce the radiation dose to as low as reasonably achievable. COMPARISON: Brain MRI 08/28/2022. Head CT 10/21/2023. CLINICAL HISTORY: 83 year old female. Mental status change, unknown cause. Noted confusion, saying words that don't make sense. Thought to be hyperkalemic. FINDINGS: BRAIN AND VENTRICLES: No acute hemorrhage. Brain volume remains normal for age. Multiple acute to subacute appearing infarcts are present in both cerebral hemispheres and the left cerebellum (largest). Specifically, there is a small new 2 cm area of cytotoxic edema in the anterior left middle frontal gyrus, anterior left MCA territory (series 3 image 12), with no regional mass effect. A larger area of confluent cytotoxic edema is seen in the left cerebellum, left PICA or AICA territory (series 3 image 25). Additionally, there is a small cortical infarct in the right lateral occipital lobe, approximately 1 cm on series 3 image 18. These findings are new from the recent head CT of 10/21/2023. No hemorrhagic transformation. No significant intracranial mass effect. No ventriculomegaly. No hydrocephalus. No extra-axial collection. No midline shift. Calcified atherosclerosis at the skull base. No suspicious intracranial vascular hyperdensity. The pattern of these findings suggests sequelae of a recent embolic event. ORBITS: No acute abnormality. SINUSES: No acute abnormality. SOFT TISSUES AND SKULL: No acute soft tissue abnormality. No skull fracture. IMPRESSION: 1. Multiple acute to  subacute appearing infarcts in both cerebral hemispheres and the left cerebellum (largest). Pattern suggests a recent embolic event. 2. No hemorrhagic transformation or intracranial mass effect. Electronically signed by: Helayne Hurst MD 11/03/2023 11:39 AM EDT RP Workstation: [id number]     Signature  -   Brayton Lye M.D on 11/05/2023 at 10:41 AM   -  To page go to [email or web address]

## 2023-11-05 NOTE — Progress Notes (Addendum)
 STROKE TEAM PROGRESS NOTE    SIGNIFICANT HOSPITAL EVENTS: 10/27 - 83 y.o. female s/p right hip fracture repair who presented from nursing facility with altered mental status. Found to have AKI, UTI, acute liver failure.   INTERIM HISTORY/SUBJECTIVE: Patient much more awake, alert, and interactive today. Oriented to place, age, time. Discussed with patient recommendation for loop recorder to assess for arrhythmias. Patient states she is amenable to getting loop recorder while inpatient. She requested we talk to her brother, who is a physician and helps her make medical decisions. Spoke with [name] Paule (brother at [phone]), who agreed to procedure.   OBJECTIVE:  CBC    Component Value Date/Time   WBC 7.2 11/05/2023 0404   RBC 2.45 (L) 11/05/2023 0404   HGB 8.4 (L) 11/05/2023 0404   HGB 12.2 05/21/2022 1231   HCT 26.9 (L) 11/05/2023 0404   HCT 36.0 05/21/2022 1231   PLT 229 11/05/2023 0404   PLT 136 (L) 05/21/2022 1231   MCV 109.8 (H) 11/05/2023 0404   MCV 106 (H) 05/21/2022 1231   MCH 34.3 (H) 11/05/2023 0404   MCHC 31.2 11/05/2023 0404   RDW 16.7 (H) 11/05/2023 0404   RDW 13.0 05/21/2022 1231   LYMPHSABS 1.5 11/05/2023 0404   MONOABS 0.6 11/05/2023 0404   EOSABS 0.2 11/05/2023 0404   BASOSABS 0.1 11/05/2023 0404    BMET    Component Value Date/Time   NA 141 11/05/2023 0404   NA 142 05/21/2022 1231   K 4.2 11/05/2023 0404   CL 107 11/05/2023 0404   CO2 24 11/05/2023 0404   GLUCOSE 83 11/05/2023 0404   BUN 33 (H) 11/05/2023 0404   BUN 27 05/21/2022 1231   CREATININE 0.99 11/05/2023 0404   CREATININE 0.80 10/05/2019 1427   CALCIUM  8.9 11/05/2023 0404   EGFR 66 05/21/2022 1231   GFRNONAA 57 (L) 11/05/2023 0404   GFRNONAA >60 10/05/2019 1427    IMAGING past 24 hours ECHOCARDIOGRAM LIMITED BUBBLE STUDY Result Date: 11/04/2023    ECHOCARDIOGRAM LIMITED REPORT   Patient Name:   Andrea Kirk Date of Exam: 11/04/2023 Medical Rec #:  [id number]     Height:       62.0  in Accession #:    [id number]    Weight:       167.3 lb Date of Birth:  10/05/1940      BSA:          1.772 m Patient Age:    83 years      BP:           111/82 mmHg Patient Gender: F             HR:           60 bpm. Exam Location:  Inpatient Procedure: Color Doppler, Saline Contrast Bubble Study, Limited Echo and Cardiac            Doppler (Both Spectral and Color Flow Doppler were utilized during            procedure). Indications:    Stroke  History:        Patient has prior history of Echocardiogram examinations, most                 recent 10/22/2023. CAD and Previous Myocardial Infarction,                 Stroke, Signs/Symptoms:Syncope; Risk Factors:Hypertension and  Dyslipidemia.  Sonographer:    Juliene Rucks Referring Phys: [id number] ZANE ADAMS  Sonographer Comments: Patient is obese. Image acquisition challenging due to patient body habitus. IMPRESSIONS  1. Left ventricular ejection fraction, by estimation, is 65 to 70%. The left ventricle has normal function. The left ventricle has no regional wall motion abnormalities. Left ventricular diastolic parameters are indeterminate.  2. Right ventricular systolic function is mildly reduced. The right ventricular size is mildly enlarged.  3. The aortic valve is tricuspid. There is mild calcification of the aortic valve. Aortic valve regurgitation is not visualized. Aortic valve sclerosis is present, with no evidence of aortic valve stenosis.  4. Agitated saline contrast bubble study was negative, with no evidence of any interatrial shunt. Comparison(s): No significant change from prior study. Prior images reviewed side by side. FINDINGS  Left Ventricle: Left ventricular ejection fraction, by estimation, is 65 to 70%. The left ventricle has normal function. The left ventricle has no regional wall motion abnormalities. The left ventricular internal cavity size was normal in size. There is  no left ventricular hypertrophy. Left ventricular diastolic  parameters are indeterminate. Right Ventricle: RV Strain pattern. The right ventricular size is mildly enlarged. Right ventricular systolic function is mildly reduced. Pericardium: Trivial pericardial effusion is present. The pericardial effusion is surrounding the apex. Tricuspid Valve: The tricuspid valve is normal in structure. Tricuspid valve regurgitation is mild . No evidence of tricuspid stenosis. Aortic Valve: The aortic valve is tricuspid. There is mild calcification of the aortic valve. Aortic valve regurgitation is not visualized. Aortic valve sclerosis is present, with no evidence of aortic valve stenosis. Pulmonic Valve: The pulmonic valve was grossly normal. Pulmonic valve regurgitation is not visualized. No evidence of pulmonic stenosis. IAS/Shunts: Agitated saline contrast was given intravenously to evaluate for intracardiac shunting. Agitated saline contrast bubble study was negative, with no evidence of any interatrial shunt. LEFT VENTRICLE PLAX 2D LVIDd:         4.80 cm LVIDs:         3.30 cm LV PW:         0.90 cm LV IVS:        0.90 cm LVOT diam:     1.90 cm LVOT Area:     2.84 cm  LEFT ATRIUM         Index LA diam:    3.30 cm 1.86 cm/m   AORTA Ao Root diam: 3.00 cm MITRAL VALVE MV Area (PHT): 3.91 cm     SHUNTS MV Decel Time: 194 msec     Systemic Diam: 1.90 cm MV E velocity: 118.00 cm/s MV A velocity: 64.50 cm/s MV E/A ratio:  1.83 Stanly Leavens MD Electronically signed by Stanly Leavens MD Signature Date/Time: 11/04/2023/5:27:13 PM    Final    VAS US  LOWER EXTREMITY VENOUS (DVT) Result Date: 11/04/2023  Lower Venous DVT Study Patient Name:  Andrea Kirk  Date of Exam:   11/04/2023 Medical Rec #: [id number]      Accession #:    [id number] Date of Birth: 1940-06-01       Patient Gender: F Patient Age:   31 years Exam Location[facility]  [facility] Procedure:      VAS US  LOWER EXTREMITY VENOUS (DVT) Referring Phys: MARSA MELVIN  --------------------------------------------------------------------------------  Indications: Stroke.  Limitations: Patient unable to rotate legs secondary to altered mental status, somnolence. Comparison Study: No prior study on file Performing Technologist: Alberta Lis RVS  Examination Guidelines: A complete evaluation includes B-mode imaging, spectral Doppler,  color Doppler, and power Doppler as needed of all accessible portions of each vessel. Bilateral testing is considered an integral part of a complete examination. Limited examinations for reoccurring indications may be performed as noted. The reflux portion of the exam is performed with the patient in reverse Trendelenburg.  +---------+---------------+---------+-----------+----------+-------------------+ RIGHT    CompressibilityPhasicitySpontaneityPropertiesThrombus Aging      +---------+---------------+---------+-----------+----------+-------------------+ CFV      Full           Yes      No                                       +---------+---------------+---------+-----------+----------+-------------------+ SFJ      Full                                                             +---------+---------------+---------+-----------+----------+-------------------+ FV Prox  Full           Yes      No                                       +---------+---------------+---------+-----------+----------+-------------------+ FV Mid   Full                                                             +---------+---------------+---------+-----------+----------+-------------------+ FV DistalFull           Yes      No                                       +---------+---------------+---------+-----------+----------+-------------------+ PFV      Full           Yes      No                                       +---------+---------------+---------+-----------+----------+-------------------+ POP                     Yes       No                   patent by color and                                                       Doppler             +---------+---------------+---------+-----------+----------+-------------------+ PTV      Full                                                             +---------+---------------+---------+-----------+----------+-------------------+  PERO     Full                                                             +---------+---------------+---------+-----------+----------+-------------------+   +---------+---------------+---------+-----------+----------+-------------------+ LEFT     CompressibilityPhasicitySpontaneityPropertiesThrombus Aging      +---------+---------------+---------+-----------+----------+-------------------+ CFV      Full           Yes      No                                       +---------+---------------+---------+-----------+----------+-------------------+ SFJ      Full                                                             +---------+---------------+---------+-----------+----------+-------------------+ FV Prox  Full           Yes      Yes                                      +---------+---------------+---------+-----------+----------+-------------------+ FV Mid   Full                                                             +---------+---------------+---------+-----------+----------+-------------------+ FV Distal               Yes      Yes                  patent by color and                                                       Doppler             +---------+---------------+---------+-----------+----------+-------------------+ PFV      Full           Yes      No                                       +---------+---------------+---------+-----------+----------+-------------------+ POP                                                   patent by color and  Doppler             +---------+---------------+---------+-----------+----------+-------------------+ PTV      Full                                                             +---------+---------------+---------+-----------+----------+-------------------+ PERO     Full                                                             +---------+---------------+---------+-----------+----------+-------------------+     Summary: RIGHT: - No evidence of deep vein thrombosis in the lower extremity. No indirect evidence of obstruction proximal to the inguinal ligament.  - No cystic structure found in the popliteal fossa.  LEFT: - No evidence of deep vein thrombosis in the lower extremity. No indirect evidence of obstruction proximal to the inguinal ligament.  - No cystic structure found in the popliteal fossa.  *See table(s) above for measurements and observations. Electronically signed by Debby Robertson on 11/04/2023 at 4:40:26 PM.    Final    VAS US  CAROTID Result Date: 11/04/2023 Carotid Arterial Duplex Study Patient Name:  Andrea Kirk  Date of Exam:   11/04/2023 Medical Rec #: [id number]      Accession #:    [id number] Date of Birth: May 21, 1940       Patient Gender: F Patient Age:   77 years Exam Location:  [facility] Procedure:      VAS US  CAROTID Referring Phys: DEVON SHAFER --------------------------------------------------------------------------------  Indications:       Right endarterectomy 03/22/16. Left endarterectomy 04/17/23. Risk Factors:      Hypertension, hyperlipidemia, Diabetes, no history of                    smoking. Comparison Study:  Prior carotid duplex done 01/31/23 indicating 1-39% right ICA                    stenosis and 40-59% left ICA stenosis Performing Technologist: Alberta Lis RVS  Examination Guidelines: A complete evaluation includes B-mode imaging, spectral Doppler, color Doppler, and power Doppler as needed of all accessible portions of each  vessel. Bilateral testing is considered an integral part of a complete examination. Limited examinations for reoccurring indications may be performed as noted.  Right Carotid Findings: +----------+--------+--------+--------+------------------+---------+           PSV cm/sEDV cm/sStenosisPlaque DescriptionComments  +----------+--------+--------+--------+------------------+---------+ CCA Prox  165     16                                          +----------+--------+--------+--------+------------------+---------+ CCA Distal84      13                                          +----------+--------+--------+--------+------------------+---------+ ICA Prox  62      8       1-39%   heterogenous  Shadowing +----------+--------+--------+--------+------------------+---------+ ICA Mid   75      15                                tortuous  +----------+--------+--------+--------+------------------+---------+ ICA Distal92      20                                tortuous  +----------+--------+--------+--------+------------------+---------+ ECA       317     15      >50%                                +----------+--------+--------+--------+------------------+---------+ +----------+--------+-------+--------+-------------------+           PSV cm/sEDV cmsDescribeArm Pressure (mmHG) +----------+--------+-------+--------+-------------------+ [id number]                                        +----------+--------+-------+--------+-------------------+ +---------+--------+--+--------+--+---------+ VertebralPSV cm/s48EDV cm/s12Antegrade +---------+--------+--+--------+--+---------+  Left Carotid Findings: +----------+--------+--------+--------+------------------+---------+           PSV cm/sEDV cm/sStenosisPlaque DescriptionComments  +----------+--------+--------+--------+------------------+---------+ CCA Prox  122     12              calcific                     +----------+--------+--------+--------+------------------+---------+ CCA Distal95      15              heterogenous                +----------+--------+--------+--------+------------------+---------+ ICA Prox  211     32      40-59%  heterogenous      Shadowing +----------+--------+--------+--------+------------------+---------+ ICA Mid   147     32                                tortuous  +----------+--------+--------+--------+------------------+---------+ ICA Distal90      21                                          +----------+--------+--------+--------+------------------+---------+ ECA       161     15                                          +----------+--------+--------+--------+------------------+---------+ +----------+--------+--------+--------+-------------------+           PSV cm/sEDV cm/sDescribeArm Pressure (mmHG) +----------+--------+--------+--------+-------------------+ [id number]                                         +----------+--------+--------+--------+-------------------+ +---------+--------+--+--------+--+--------+ VertebralPSV cm/s32EDV cm/s10Atypical +---------+--------+--+--------+--+--------+   Summary: Right Carotid: Velocities in the right ICA are consistent with a 1-39% stenosis. Left Carotid: Velocities in the left ICA are consistent with a 40-59% stenosis.               Post stenotic waveform noted proximal ICA, unable to match  previous diastolic velocities. Vertebrals:  Right vertebral artery demonstrates antegrade flow. The left              vertebral artery exhibits an atypical waveform. Subclavians: Normal flow hemodynamics were seen in bilateral subclavian              arteries. *See table(s) above for measurements and observations.     Preliminary     Vitals:   11/04/23 2000 11/05/23 0008 11/05/23 0400 11/05/23 0800  BP: (!) 137/56 (!) 152/68 (!) 164/80 (!) 143/68  Pulse:  63 (!) 57 (!) 57  Resp: 16  17 17 15   Temp: 98.2 F (36.8 C) 98.3 F (36.8 C) 98.3 F (36.8 C) 97.6 F (36.4 C)  TempSrc:  Oral Oral Oral  SpO2: 95% 97% 98% (!) 88%  Weight:   78.7 kg 75.1 kg  Height:         PHYSICAL EXAM: General: Alert, well-nourished, well-developed patient in no acute distress Psych: Mood and affect appropriate for situation CV: Regular rate and rhythm on monitor Respiratory: Regular, unlabored respirations on room air   NEURO:  Mental Status: Alert, oriented. Patient is able to give clear and coherent history. Follows commands. Speech/Language: speech is without dysarthria or aphasia. Naming, repetition, fluency, and comprehension intact.  Cranial Nerves:  II: Visual fields full. Blinks to threat bilaterally. III, IV, VI: EOMI. Eyelids elevate symmetrically.  V: Sensation is intact to light touch and symmetrical to face.  VII: Facial symmetrical in the context of left facial full denture VIII: hearing intact to voice. IX, X: Palate elevates symmetrically. Phonation is normal.  XII: Tongue is midline without fasciculations. Motor: Antigravity strength without drift present RUE, LUE, LLE. Unable to assess antigravity strength in RLE due to hip pain. Full strength on dorsi and plantar flexion bilaterally. Tone: is normal and bulk is normal Sensation - Sensation intact in all extremities Gait - deferred  Most Recent NIH 1   ASSESSMENT/PLAN:  Andrea Kirk is a 83 y.o. female with history of hypertension, hyperlipidemia, diabetes, CAD s/p CABG, CVA, carotid artery disease s/p left CAE in 2015, right CAE in 2018, CKD 2, GERD, RLS, diastolic CHF, depression, anxiety, chronic respiratory failure, chronic pain admitted for altered mental status. She is s/p IM rod to repair her right hip fracture. Her ASA and brilinta  was stopped for the procedure and she was discharged on full dose lovenox  for DVT ppx. NIH on Admission 8.  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 Pattern suggests a recent embolic event. No hemorrhagic transformation or intracranial mass effect.  MRI: Multifocal acute infarcts with dominant involvement of the left cerebellum in the left PICA territory with associated edema causing mild local mass effect. Additional acute infarcts in the right cerebellum, left frontal lobe, bilateral posterior frontal and parietal lobes, right occipital lobe, and left occipital cortex. MRA: No acute findings. 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 Mild atherosclerotic irregularity of the bilateral carotid siphons.  2D Echo EF >75% Bilateral LE venous US  - negative for DVT Carotid duplex US  - L ICA 40-59% stenosis  Will consider loop recorder on discharge LDL 32 HgbA1c 5.7 VTE prophylaxis - SQ Heparin  Patient taking Lovenox  post-hip fracture repair prior to admission, now on aspirin  81 mg daily and plavix  DAPT for 3 weeks and then  ASA alone.   Therapy recommendations: SNF Disposition: Pending  History of stroke 04/2020 admitted for left temporal cortex infarct on MRI. CT also showed old cerebellar infarct.  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 CT perfusion negative. EF 50 to 50%.  LDL 82, A1c 6.9. Discharged on aspirin  and Brilinta  and Crestor . Recommend loop recorder but patient prefer to be done as outpatient at that time. However, apparently that was not done since.  Hypertension Home meds: Amlodipine , Furosemide , Valsartan Stable Avoid low BP Long term BP goal of normotension  AKI / UTI/ Uremia Cr 0.84--2.40-2.37-1.52-0.99 IV fluids stopped Avoid nephrotoxic agents On Rocephin   Blood Cx NGTD Urine Cx E-coli  Transaminitis AST/ALT 1001/433--571/353 --228/223 Could be related to dehydration and immobility Close  monitoring  Hyperlipidemia Home meds: Rosuvastatin  20 mg LDL 32, goal < 70 Continue statin once LFTs normalizes Patient has genetically low HDL  Diabetes type II Controlled Home meds: Jardiance 25 mg, Glipizide  5 mg HgbA1c 5.7, goal < 7.0 CBGs SSI Recommend close follow-up with PCP for better DM control  Dysphagia Patient has post-stroke dysphagia, SLP consulted On dysphagia 3 and thin liquid Advance diet as tolerated  Other Stroke Risk Factors Obesity, Body mass index is 30.28 kg/m., BMI >/= 30 associated with increased stroke risk, recommend weight loss, diet and exercise as appropriate  CAD status post CABG in 1997 PVD, status post bilateral CEA in 2015 on the left and 2018 on the right Diastolic congestive heart failure Advanced age, >/= 60  Other Active Problems Depression/ Anxiety - continue BuSpar  and Celexa  when able Chronic respiratory failure with hypoxia Recent right hip surgery -> discharged to [facility] 10/20  Hospital day # 1  Patient seen by resident and attending neurologist. Attending neurologist to make changes and additions to above note as necessary.   Ashley Gravely, MD, PGY-1  ATTENDING NOTE: I reviewed above note and agree with the assessment and plan. Pt was seen and examined.   RN in room to help her with bedpen.  No family at bedside.  Patient eyes open, awake, alert, orientated to age, place, time. No aphasia, fluent language, following all simple commands. Able to name and repeat in mildly dysarthric voice. No gaze palsy, tracking bilaterally, visual field full. No facial droop with consideration of left poor denture. Tongue midline. Bilateral UEs 4/5, no significant drift.  Right lower extremity limited movement proximally due to hip fracture, however distally 5/5.  Left lower extremity proximal 3/5 and distal 5/5.SABRA Sensation symmetrical bilaterally, b/l FTN intact although slow, gait not tested.   Patient so far stroke workup negative, concerning  for cardioembolic source.  Will recommend loop recorder before discharge.  Will start DAPT tomorrow.  Monitor liver function, once normalizes, can resume statin.  PT and OT recommend SNF.  For detailed assessment and plan, please refer to above as I have made changes wherever appropriate.   Ary Cummins, MD PhD Stroke Neurology 11/05/2023 6:10 PM    To contact Stroke Continuity provider, please refer to [email or web address]. After hours, contact General Neurology

## 2023-11-05 NOTE — Progress Notes (Signed)
 Speech Language Pathology Treatment: Cognitive-Linguistic  Patient Details Name: Andrea Kirk MRN: [id number] DOB: Jan 28, 1940 Today's Date: 11/05/2023 Time: 1410-1433 SLP Time Calculation (min) (ACUTE ONLY): 23 min  Assessment / Plan / Recommendation Clinical Impression  Pt seen for continued diagnostic assessment of cognition/speech as she was very sleepy yesterday with difficulty participating. Today she was able to easily awake, agreeable to session with intelligible speech attended for approximately 15 minutes until started to have significant abdominal cramping, crying, grimacing. She problem solved situation with minimal prompting to call RN for meds which she executed. Prior to this orientation to city has improved, knows she is in the hospital but unable to state reason thinking she had fallen. Reminded of her stroke and asked to remember to recall at end of session. After delay of 18 minutes, stated she had a blood clot and asked to be more specific she replied in my brain and therapist provided more detailed information with stroke. Continues with decreased working memory as she stated seizure when asked several minutes later (SLP wrote stroke on white board but stated she could not see it). Problem solved using fake money for subtraction and needed max verbal cueing. She asked what day it was independently looked at clock on wall which therapist brought closer to her and she accurately ready month, date and DOW. ST will continue tx.   HPI HPI: 83 y.o. female with medical history significant of hypertension, hyperlipidemia, diabetes, CAD status post CABG, CVA, carotid artery disease, CKD 2, GERD, RLS, chronic diastolic CHF, depression, anxiety, chronic respiratory failure, chronic pain presenting with altered mental status. Patient recently admitted 10/14-10/20 with right hip fracture status post repair and discharged to SNF. MRI multifocal acute infarcts with dominant involvement of the  left cerebellum in the left PICA territory with associated edema causing mild local mass  effect, additional acute infarcts in the right cerebellum, left frontal lobe, bilateral posterior frontal and parietal lobes, right occipital lobe, and left occipital cortex.SLE 2022 revealed mild cog impairments impacted by her level of pain at time of eval. ST did not recommend acute ST but follow up at next venue.      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

## 2023-11-05 NOTE — Progress Notes (Signed)
 Progress Note  Patient Name: Andrea Kirk Date of Encounter: 11/05/2023 [facility] Cardiologist: Lonni CROME Nanas, MD   Interval Summary   Alert and orientated. Denies any chest pain or shortness of breath. Continuing to have hip pain.  Vital Signs Vitals:   11/04/23 1925 11/04/23 2000 11/05/23 0008 11/05/23 0400  BP: 130/74 (!) 137/56 (!) 152/68 (!) 164/80  Pulse: (!) 57  63 (!) 57  Resp: 18 16 17 17   Temp: 97.9 F (36.6 C) 98.2 F (36.8 C) 98.3 F (36.8 C) 98.3 F (36.8 C)  TempSrc: Oral  Oral Oral  SpO2:  95% 97% 98%  Weight:    78.7 kg  Height:        Intake/Output Summary (Last 24 hours) at 11/05/2023 0738 Last data filed at 11/04/2023 2356 Gross per 24 hour  Intake 1348.93 ml  Output 1500 ml  Net -151.07 ml      11/05/2023    4:00 AM 11/04/2023    5:00 AM 11/03/2023    6:17 PM  Last 3 Weights  Weight (lbs) 173 lb 8 oz 167 lb 5.3 oz 185 lb 6.5 oz  Weight (kg) 78.7 kg 75.9 kg 84.1 kg      Telemetry/ECG  Normal sinus rhythm with heart rates in the 50-60's - Personally Reviewed  Physical Exam  GEN: No acute distress.  Laying on bed during interview. Neck: No JVD Cardiac: RRR, no murmurs, rubs, or gallops.  Respiratory: Clear to auscultation bilaterally. GI: Soft, nontender, non-distended  MS: No edema  Assessment & Plan   Andrea Kirk is a 83 y.o. female with a hx of hypertension, diabetes, carotid artery disease s/p left CAE in 2015, right CAE in 2018, diastolic heart failure, mild LVH, prior CVA, GERD, and CAD s/p CABG in 1997 unclear which grafts patient received who is being seen 11/03/2023 for the evaluation of elevated troponins at the request of Bernardino Fireman MD.   New onset altered mental status Acute CVA On 10/22/2023 had a right hip fracture following a mechanical fall.  Orthopedic surgery saw the patient and surgically stabilized her hip.  The patient was discharged to a nursing facility. Patient presented to the emergency  department with altered mental status.  That reportedly started 2 days ago. Head CT showed multiple acute and subacute appearing infarcts that are suspected to be embolic in nature. Mental status has been improving since arriving at the hospital. Management per neurology   AKI -resolved UTI Possible sepsis Currently being treated for UTI.  This may explain some of the altered mental status. Management per primary.    Elevated troponins 779> 841 CAD s/p CABG in 1997 Severe Pulmonary hypertension Hyperlipidemia Echocardiogram on 10/22/2023 showed an LVEF of greater than 75%, no RWMA, G2 DD, severely elevated pulmonary artery systolic pressure, moderately dilated left atrium, mildly dilated right atrium, mild aortic stenosis, and borderline ascending aortic dilation. EKG shows normal sinus rhythm with a rate of 60, ST elevation in lead III and aVR.  ST depression and T wave inversions in lead I, aVL, and V2.  T wave inversions are new since prior EKG on 10/21/2023 but did appear to have some depression and elevation on prior EKG. Echo was a challenging study but showed a normal LVEF of 65 to 70%, no RWMA, mildly reduced RV systolic function, and bubble study was negative.  There was no significant change from the prior echo. Continue aspirin  81 mg daily. Plan to restart statin when LFTs improve more. Follow-up  with Dr. Kate on 11/25/2023.     Hypertension Blood pressures have been elevated over night. May consider restarting home amlodipine  if no longer in permissive hypertension.    Chronic HFpEF May hold SGLT2 with UTI concerns and patient being bed bound from hip fracture.    Elevated LFTs LFTs are downtrending  [facility] will sign off.   Medication Recommendations:  as above Other recommendations (labs, testing, etc):  none Follow up as an outpatient:  Follow-up with Dr. Kate on 11/25/2023 For questions or updates, please contact [facility] Please consult [email or web address] for contact info under        Signed, Roarke Marciano, PA-C

## 2023-11-06 ENCOUNTER — Inpatient Hospital Stay (HOSPITAL_COMMUNITY)

## 2023-11-06 DIAGNOSIS — I6340 Cerebral infarction due to embolism of unspecified cerebral artery: Secondary | ICD-10-CM | POA: Diagnosis not present

## 2023-11-06 DIAGNOSIS — R7401 Elevation of levels of liver transaminase levels: Secondary | ICD-10-CM | POA: Diagnosis not present

## 2023-11-06 DIAGNOSIS — E1151 Type 2 diabetes mellitus with diabetic peripheral angiopathy without gangrene: Secondary | ICD-10-CM | POA: Diagnosis not present

## 2023-11-06 DIAGNOSIS — I639 Cerebral infarction, unspecified: Secondary | ICD-10-CM | POA: Diagnosis not present

## 2023-11-06 DIAGNOSIS — G936 Cerebral edema: Secondary | ICD-10-CM | POA: Diagnosis not present

## 2023-11-06 LAB — CBC WITH DIFFERENTIAL/PLATELET
Abs Immature Granulocytes: 0.03 K/uL (ref 0.00–0.07)
Basophils Absolute: 0.1 K/uL (ref 0.0–0.1)
Basophils Relative: 1 %
Eosinophils Absolute: 0.2 K/uL (ref 0.0–0.5)
Eosinophils Relative: 3 %
HCT: 26.5 % — ABNORMAL LOW (ref 36.0–46.0)
Hemoglobin: 8.3 g/dL — ABNORMAL LOW (ref 12.0–15.0)
Immature Granulocytes: 1 %
Lymphocytes Relative: 27 %
Lymphs Abs: 1.6 K/uL (ref 0.7–4.0)
MCH: 33.6 pg (ref 26.0–34.0)
MCHC: 31.3 g/dL (ref 30.0–36.0)
MCV: 107.3 fL — ABNORMAL HIGH (ref 80.0–100.0)
Monocytes Absolute: 0.6 K/uL (ref 0.1–1.0)
Monocytes Relative: 11 %
Neutro Abs: 3.4 K/uL (ref 1.7–7.7)
Neutrophils Relative %: 57 %
Platelets: 204 K/uL (ref 150–400)
RBC: 2.47 MIL/uL — ABNORMAL LOW (ref 3.87–5.11)
RDW: 16.6 % — ABNORMAL HIGH (ref 11.5–15.5)
WBC: 5.8 K/uL (ref 4.0–10.5)
nRBC: 0 % (ref 0.0–0.2)

## 2023-11-06 LAB — URINE CULTURE: Culture: >=100000 — AB

## 2023-11-06 LAB — COMPREHENSIVE METABOLIC PANEL WITH GFR
ALT: 145 U/L — ABNORMAL HIGH (ref 0–44)
AST: 104 U/L — ABNORMAL HIGH (ref 15–41)
Albumin: 2.3 g/dL — ABNORMAL LOW (ref 3.5–5.0)
Alkaline Phosphatase: 80 U/L (ref 38–126)
Anion gap: 11 (ref 5–15)
BUN: 19 mg/dL (ref 8–23)
CO2: 24 mmol/L (ref 22–32)
Calcium: 9.2 mg/dL (ref 8.9–10.3)
Chloride: 105 mmol/L (ref 98–111)
Creatinine, Ser: 0.79 mg/dL (ref 0.44–1.00)
GFR, Estimated: >60 mL/min (ref >60)
Glucose, Bld: 102 mg/dL — ABNORMAL HIGH (ref 70–99)
Potassium: 4.3 mmol/L (ref 3.5–5.1)
Sodium: 140 mmol/L (ref 135–145)
Total Bilirubin: 0.9 mg/dL (ref 0.0–1.2)
Total Protein: 5.9 g/dL — ABNORMAL LOW (ref 6.5–8.1)

## 2023-11-06 LAB — GLUCOSE, CAPILLARY
Glucose-Capillary: 110 mg/dL — ABNORMAL HIGH (ref 70–99)
Glucose-Capillary: 172 mg/dL — ABNORMAL HIGH (ref 70–99)
Glucose-Capillary: 153 mg/dL — ABNORMAL HIGH (ref 70–99)
Glucose-Capillary: 140 mg/dL — ABNORMAL HIGH (ref 70–99)

## 2023-11-06 LAB — MAGNESIUM: Magnesium: 2.1 mg/dL (ref 1.7–2.4)

## 2023-11-06 MED ORDER — BISACODYL 10 MG RE SUPP: 10.0000 mg | Freq: Every day | RECTAL | Status: DC | PRN

## 2023-11-06 MED ORDER — MAGNESIUM CITRATE PO SOLN
1.0000 | Freq: Once | ORAL | Status: AC
  Administered 2023-11-06: 1 via ORAL
  Filled 2023-11-06: qty 296

## 2023-11-06 MED ORDER — OYSTER SHELL CALCIUM/D3 500-5 MG-MCG PO TABS
1.0000 | ORAL_TABLET | Freq: Two times a day (BID) | ORAL | Status: DC
  Administered 2023-11-06 – 2023-11-07 (×4): 1 via ORAL
  Filled 2023-11-06 (×4): qty 1

## 2023-11-06 MED ORDER — POLYETHYLENE GLYCOL 3350 17 G PO PACK: 17.0000 g | PACK | Freq: Every day | ORAL | Status: DC | PRN

## 2023-11-06 MED ORDER — SODIUM CHLORIDE 0.9 % IV SOLN
1.0000 g | Freq: Three times a day (TID) | INTRAVENOUS | Status: DC
  Administered 2023-11-06 – 2023-11-07 (×3): 1 g via INTRAVENOUS
  Filled 2023-11-06 (×3): qty 20

## 2023-11-06 MED ORDER — PANTOPRAZOLE SODIUM 40 MG PO TBEC
40.0000 mg | DELAYED_RELEASE_TABLET | Freq: Two times a day (BID) | ORAL | Status: DC
  Administered 2023-11-06 – 2023-11-07 (×3): 40 mg via ORAL
  Filled 2023-11-06 (×4): qty 1

## 2023-11-06 NOTE — Progress Notes (Signed)
 PROGRESS NOTE                                                                                                                                                                                                             Patient Demographics:    Andrea Kirk, is a 83 y.o. female, DOB - May 09, 1940, FMW:[id number]  Outpatient Primary MD for the patient is System, Provider Not In    LOS - 2  Admit date - 11/03/2023    Chief Complaint  Patient presents with   Altered Mental Status       Brief Narrative (HPI from H&P)    83 y.o. female with medical history significant of hypertension, hyperlipidemia, diabetes, CAD status post CABG, CVA, carotid artery disease, CKD 2, GERD, RLS, chronic diastolic CHF, depression, anxiety, chronic respiratory failure, chronic pain presenting with altered mental status. - Patient recently admitted 10/14-10/20 with right hip fracture status post repair and discharged to SNF.   She was doing well until 2 days ago.  In her usual state of health and had been able to ambulate on her operative extremity.     Developed altered mental status starting yesterday.  Had outpatient lab workup showing AKI.  Brought to the ED for further evaluation today.  In the ER workup suggestive of diffuse embolic stroke, AKI, shock liver and mild demand ischemia.  Seen by neurology and cardiology and admitted to the hospital.      Subjective:    Andrea Kirk today denies any chest pain, she complains of lower abdominal pain, reports constipation last BM a week ago, reports right hip pain improved with pain medicine asking to increase pain dose, but I have informed her she only took pain meds twice yesterday, I have encouraged her to ask more frequently if she feels she needs it.      Assessment  & Plan :   Acute metabolic encephalopathy  - Multifactorial, due to acute CVA, UTI, shock liver and dehydration/AKI . - Mentation  continues to improve .   Acute CVA . -MRI: Multifocal acute infarcts with dominant involvement of the left cerebellum in the left PICA territory with associated edema causing mild local mass effect. Additional acute infarcts in the right cerebellum, left frontal lobe, bilateral posterior frontal and parietal lobes, right occipital lobe, and left occipital cortex. -MRA: No acute  findings. 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 Mild atherosclerotic irregularity of the bilateral carotid siphons.  -2D Echo EF >75%, no evidence of intra-atrial shunt -Bilateral LE venous US  - negative for DVT -Carotid duplex US  - L ICA 40-59% stenosis  -Neurology input greatly appreciated, concern for embolic CVA, plan for loop recorder prior to discharge tomorrow. -Patient taking Lovenox  post-hip fracture repair prior to admission, now on aspirin  81 mg daily and Plavix  for 3 weeks then aspirin  alone. -Therapy recommendations:  SNF   AKI Uremia Elevated anion gap acidosis Due to dehydration improving with IV fluids hold diuretics Continue with IV fluids   CAD Elevated troponins due to demand ischemia/type II NSTEMI Elevated BNP -History of CABG.  Noted to have troponin elevated to 779 and 841 on repeat. -  Demand ischemia from dehydration hypotension, also had shock liver and AKI. - Cardiology input greatly appreciated, no concern for ACS. - Continue with aspirin  for now, add Plavix  when able (for now hold on adding, please review neurorecommendations). - 2D echo with a preserved EF, no evidence of intra-atrial shunt.   ESBL UTI Urinary retention Culture growing ESBL Proteus and E. coli  - Symptomatic with suprapubic tenderness this morning, so we will start on meropenem, and will give nitrofurantoin prior to discharge. - Attempt voiding trial today. - will need to hold Jardiane on discharge   Transaminitis Presentation consistent with shock liver, trend is improving, also appears to have  undiagnosed cirrhosis per CT imaging, outpatient GI follow-up could have NASH. -LFTs trending down  Right hip surgery 3 weeks ago.  Continue PT OT discharge to SNF once better.  - With pain in right hip postop area, site looks clean, hepatic input greatly appreciated, staples to be removed, x-ray ordered by orthopedic - PT/OT   Chronic diastolic CHF Stable and compensated in fact was slightly dehydrated.  Cardiology on board.   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  Code Status :  Full  Consults  :  Neuro, Cards, orthopedic  PUD Prophylaxis :     Procedures  :     TTE bubble study-    MRI - 1. 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 2. Additional acute infarcts in the right cerebellum, left frontal lobe, bilateral posterior frontal and parietal lobes, right occipital lobe, and left occipital cortex. 3. No effacement of the fourth ventricle, hydrocephalus, or midline shift. 4. Mild chronic microvascular ischemic changes.   MRA - 1. No acute findings. 2. 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 3. Limited visualization of the proximal left V4 segment, likely related to atherosclerosis and  high-grade stenosis seen on prior  CTA. 4. Mild atherosclerotic irregularity of the bilateral carotid siphons.  CT A&P -  1. No acute findings in the abdomen/pelvis. 2. Mild nodularity to the liver contour which can be seen with cirrhosis. 3. Stable 2.9 cm simple left renal cyst. No follow-up imaging is recommended. 4. Stable bibasilar opacification right worse than left likely atelectasis. 5. Aortic atherosclerosis. 6. Postsurgical changes with stable subcutaneous hematomas over the right hip. Aortic Atherosclerosis (ICD10-I70.0).   CT chest, A&P -  1. No acute findings or clear explanation for the patient's symptoms. 2. Interval proximal right femoral ORIF with improved alignment of the previously demonstrated intertrochanteric fracture. There are small hematomas within the lateral subcutaneous fat. 3. Dependent airspace opacities at both lung bases, similar to recent prior CT, likely atelectasis. 4. Mild contour irregularity of the liver, potentially due to mild underlying cirrhosis. 5.  Aortic Atherosclerosis (ICD10-I70.0).       Disposition Plan  :     DVT Prophylaxis  :    heparin  injection 5,000 Units Start: 11/04/23 0815 SCD's Start: 11/03/23 1447    Lab Results  Component Value Date   PLT 204 11/06/2023    Diet :  Diet Order             DIET DYS 3 Room service appropriate? No; Fluid consistency: Thin  Diet effective now                    Inpatient Medications  Scheduled Meds:  aspirin   81 mg Oral Daily   calcium -vitamin D  1 tablet Oral BID WC   Chlorhexidine  Gluconate Cloth  6 each Topical Daily   clopidogrel   75 mg Oral Daily   cyanocobalamin   1,000 mcg Oral Once per day on Monday Tuesday Wednesday Thursday Friday   gabapentin   300 mg Oral BID   heparin  injection (subcutaneous)  5,000 Units Subcutaneous Q8H   insulin  aspart  0-15 Units Subcutaneous TID WC   oxybutynin   10 mg Oral Daily   pantoprazole   40 mg Oral BID   Continuous Infusions:  meropenem  (MERREM) IV     PRN Meds:.acetaminophen  **OR** [DISCONTINUED] acetaminophen  (TYLENOL ) oral liquid 160 mg/5 mL **OR** [DISCONTINUED] acetaminophen , albuterol , bisacodyl, HYDROcodone -acetaminophen , polyethylene glycol  Antibiotics  :    Anti-infectives (From admission, onward)    Start     Dose/Rate Route Frequency Ordered Stop   11/06/23 1245  meropenem (MERREM) 1 g in sodium chloride  0.9 % 100 mL IVPB        1 g 200 mL/hr over 30 Minutes Intravenous Every 8 hours 11/06/23 1156     11/03/23 2200  cefTRIAXone  (ROCEPHIN ) 2 g in sodium chloride  0.9 % 100 mL IVPB  Status:  Discontinued        2 g 200 mL/hr over 30 Minutes Intravenous Every 24 hours 11/03/23 1510 11/06/23 1156   11/03/23 1330  ceFEPIme  (MAXIPIME ) 2 g in sodium chloride  0.9 % 100 mL IVPB        2 g 200 mL/hr over 30 Minutes Intravenous  Once 11/03/23 1330 11/03/23 1530         Objective:   Vitals:   11/06/23 0433 11/06/23 0435 11/06/23 0800 11/06/23 1205  BP:   (!) 168/62 (!) 163/65  Pulse: (!) 59 60 66 66  Resp: (!) 23 14 12 16   Temp:   98.7 F (37.1 C) 98.6 F (37 C)  TempSrc:   Oral Oral  SpO2: (!) 87% 95% 96% 96%  Weight:  87.1 kg  Height:        Wt Readings from Last 3 Encounters:  11/06/23 87.1 kg  10/21/23 84.1 kg  07/14/23 81.4 kg     Intake/Output Summary (Last 24 hours) at 11/06/2023 1207 Last data filed at 11/06/2023 0600 Gross per 24 hour  Intake 450 ml  Output 1925 ml  Net -1475 ml     Physical Exam  Awake Alert, Oriented X 3, No new F.N deficits, Normal affect Symmetrical Chest wall movement, Good air movement bilaterally RRR,No Gallops,Rubs or new Murmurs, No Parasternal Heave +ve B.Sounds, suprapubic tenderness No Cyanosis, Clubbing or edema, left hip surgical site looks clean, expected post bruising   Data Review:    Recent Labs  Lab 11/03/23 1105 11/03/23 1115 11/04/23 0405 11/05/23 0404 11/06/23 0218  WBC  --  9.1 8.0 7.2 5.8  HGB 9.5* 9.1* 8.1* 8.4* 8.3*  HCT  28.0* 29.1* 26.4* 26.9* 26.5*  PLT  --  282 250 229 204  MCV  --  109.4* 109.5* 109.8* 107.3*  MCH  --  34.2* 33.6 34.3* 33.6  MCHC  --  31.3 30.7 31.2 31.3  RDW  --  16.4* 16.5* 16.7* 16.6*  LYMPHSABS  --  1.2  --  1.5 1.6  MONOABS  --  0.5  --  0.6 0.6  EOSABS  --  0.1  --  0.2 0.2  BASOSABS  --  0.1  --  0.1 0.1    Recent Labs  Lab 11/03/23 1105 11/03/23 1115 11/03/23 1116 11/03/23 1618 11/04/23 0405 11/05/23 0404 11/06/23 0218  NA 140 141  --   --  144 141 140  K 4.4 4.4  --   --  4.3 4.2 4.3  CL 105 103  --   --  108 107 105  CO2  --  21*  --   --  24 24 24   ANIONGAP  --  17*  --   --  12 10 11   GLUCOSE 141* 143*  --   --  74 83 102*  BUN 72* 74*  --   --  58* 33* 19  CREATININE 2.40* 2.37*  --   --  1.52* 0.99 0.79  AST  --  1,001*  --   --  571* 228* 104*  ALT  --  437*  --   --  353* 223* 145*  ALKPHOS  --  98  --   --  88 76 80  BILITOT  --  1.6*  --   --  1.2 0.7 0.9  ALBUMIN  --  2.9*  --   --  2.4* 2.3* 2.3*  LATICACIDVEN 1.2  --   --   --   --   --   --   HGBA1C  --   --   --   --  5.7*  --   --   BNP  --   --  673.1*  --   --   --   --   MG  --   --   --  3.0*  --  2.4 2.1  CALCIUM   --  8.9  --   --  8.9 8.9 9.2      Recent Labs  Lab 11/03/23 1105 11/03/23 1115 11/03/23 1116 11/03/23 1618 11/04/23 0405 11/05/23 0404 11/06/23 0218  LATICACIDVEN 1.2  --   --   --   --   --   --   HGBA1C  --   --   --   --  5.7*  --   --   BNP  --   --  673.1*  --   --   --   --   MG  --   --   --  3.0*  --  2.4 2.1  CALCIUM   --  8.9  --   --  8.9 8.9 9.2    --------------------------------------------------------------------------------------------------------------- Lab Results  Component Value Date   CHOL 66 11/04/2023   HDL 11 (L) 11/04/2023   LDLCALC 32 11/04/2023   TRIG 117 11/04/2023   CHOLHDL 6.0 11/04/2023    Lab Results  Component Value Date   HGBA1C 5.7 (H) 11/04/2023   No results for input(s): TSH, T4TOTAL, FREET4, T3FREE,  THYROIDAB in the last 72 hours. Recent Labs    11/04/23 0405  VITAMINB12 1,976*  FOLATE >20.0    Micro Results Recent Results (from the past 240 hours)  Blood Culture (routine x 2)     Status: None (Preliminary result)   Collection Time: 11/03/23 10:26 AM   Specimen: BLOOD RIGHT ARM  Result Value Ref Range Status   Specimen Description BLOOD RIGHT ARM  Final   Special Requests   Final    BOTTLES DRAWN AEROBIC AND ANAEROBIC Blood Culture results may not be optimal due to an inadequate volume of blood received in culture bottles   Culture   Final    NO GROWTH 3 DAYS Performed at [facility], [address], [place], [place] [address]    Report Status PENDING  Incomplete  Blood Culture (routine x 2)     Status: None (Preliminary result)   Collection Time: 11/03/23 10:31 AM   Specimen: BLOOD LEFT ARM  Result Value Ref Range Status   Specimen Description BLOOD LEFT ARM  Final   Special Requests   Final    BOTTLES DRAWN AEROBIC AND ANAEROBIC Blood Culture results may not be optimal due to an inadequate volume of blood received in culture bottles   Culture   Final    NO GROWTH 3 DAYS Performed at [facility], [address], [place], [place] [address]    Report Status PENDING  Incomplete  Resp panel by RT-PCR (RSV, Flu A&B, Covid) Anterior Nasal Swab     Status: None   Collection Time: 11/03/23 10:46 AM   Specimen: Anterior Nasal Swab  Result Value Ref Range Status   SARS Coronavirus 2 by RT PCR NEGATIVE NEGATIVE Final   Influenza A by PCR NEGATIVE NEGATIVE Final   Influenza B by PCR NEGATIVE NEGATIVE Final    Comment: (NOTE) The Xpert Xpress SARS-CoV-2/FLU/RSV plus assay is intended as an aid in the diagnosis of influenza from Nasopharyngeal swab specimens and should not be used as a sole basis for treatment. Nasal washings and aspirates are unacceptable for Xpert Xpress SARS-CoV-2/FLU/RSV testing.  Fact Sheet for  Patients: bloggercourse.com  Fact Sheet for Healthcare Providers: seriousbroker.it  This test is not yet approved or cleared by the [place]  FDA and has been authorized for detection and/or diagnosis of SARS-CoV-2 by FDA under an Emergency Use Authorization (EUA). This EUA will remain in effect (meaning this test can be used) for the duration of the COVID-19 declaration under Section 564(b)(1) of the Act, 21 U.S.C. section 360bbb-3(b)(1), unless the authorization is terminated or revoked.     Resp Syncytial Virus by PCR NEGATIVE NEGATIVE Final    Comment: (NOTE) Fact Sheet for Patients: bloggercourse.com  Fact Sheet for Healthcare Providers: seriousbroker.it  This test is not  yet approved or cleared by the [place]  FDA and has been authorized for detection and/or diagnosis of SARS-CoV-2 by FDA under an Emergency Use Authorization (EUA). This EUA will remain in effect (meaning this test can be used) for the duration of the COVID-19 declaration under Section 564(b)(1) of the Act, 21 U.S.C. section 360bbb-3(b)(1), unless the authorization is terminated or revoked.  Performed at [facility], [address] [place], [place] [address]   Urine Culture     Status: Abnormal   Collection Time: 11/03/23 12:00 PM   Specimen: Urine, Random  Result Value Ref Range Status   Specimen Description URINE, RANDOM  Final   Special Requests   Final    NONE Reflexed from [id number] Performed at [facility], [address] [place], [place] [address]    Culture (A)  Final    >=100,000 COLONIES/mL ESCHERICHIA COLI Confirmed Extended Spectrum Beta-Lactamase Producer (ESBL).  In bloodstream infections from ESBL organisms, carbapenems are preferred over piperacillin/tazobactam. They are shown to have a lower risk of mortality. 40,000 COLONIES/mL PROTEUS MIRABILIS    Report Status  11/06/2023 FINAL  Final   Organism ID, Bacteria ESCHERICHIA COLI (A)  Final   Organism ID, Bacteria PROTEUS MIRABILIS (A)  Final      Susceptibility   Escherichia coli - MIC*    AMPICILLIN >=32 RESISTANT Resistant     CEFAZOLIN  (URINE) Value in next row Resistant      >=32 RESISTANTThis is a modified FDA-approved test that has been validated and its performance characteristics determined by the reporting laboratory.  This laboratory is certified under the Clinical Laboratory Improvement Amendments CLIA as qualified to perform high complexity clinical laboratory testing.    CEFEPIME  Value in next row Resistant      >=32 RESISTANTThis is a modified FDA-approved test that has been validated and its performance characteristics determined by the reporting laboratory.  This laboratory is certified under the Clinical Laboratory Improvement Amendments CLIA as qualified to perform high complexity clinical laboratory testing.    ERTAPENEM Value in next row Sensitive      >=32 RESISTANTThis is a modified FDA-approved test that has been validated and its performance characteristics determined by the reporting laboratory.  This laboratory is certified under the Clinical Laboratory Improvement Amendments CLIA as qualified to perform high complexity clinical laboratory testing.    CEFTRIAXONE  Value in next row Resistant      >=32 RESISTANTThis is a modified FDA-approved test that has been validated and its performance characteristics determined by the reporting laboratory.  This laboratory is certified under the Clinical Laboratory Improvement Amendments CLIA as qualified to perform high complexity clinical laboratory testing.    CIPROFLOXACIN Value in next row Resistant      >=32 RESISTANTThis is a modified FDA-approved test that has been validated and its performance characteristics determined by the reporting laboratory.  This laboratory is certified under the Clinical Laboratory Improvement Amendments CLIA as  qualified to perform high complexity clinical laboratory testing.    GENTAMICIN Value in next row Sensitive      >=32 RESISTANTThis is a modified FDA-approved test that has been validated and its performance characteristics determined by the reporting laboratory.  This laboratory is certified under the Clinical Laboratory Improvement Amendments CLIA as qualified to perform high complexity clinical laboratory testing.    NITROFURANTOIN Value in next row Sensitive      >=32 RESISTANTThis is a modified FDA-approved test that has been validated and its performance characteristics determined by the  reporting laboratory.  This laboratory is certified under the Clinical Laboratory Improvement Amendments CLIA as qualified to perform high complexity clinical laboratory testing.    TRIMETH /SULFA  Value in next row Resistant      >=32 RESISTANTThis is a modified FDA-approved test that has been validated and its performance characteristics determined by the reporting laboratory.  This laboratory is certified under the Clinical Laboratory Improvement Amendments CLIA as qualified to perform high complexity clinical laboratory testing.    AMPICILLIN/SULBACTAM Value in next row Sensitive      >=32 RESISTANTThis is a modified FDA-approved test that has been validated and its performance characteristics determined by the reporting laboratory.  This laboratory is certified under the Clinical Laboratory Improvement Amendments CLIA as qualified to perform high complexity clinical laboratory testing.    PIP/TAZO Value in next row Sensitive      <=4 SENSITIVEThis is a modified FDA-approved test that has been validated and its performance characteristics determined by the reporting laboratory.  This laboratory is certified under the Clinical Laboratory Improvement Amendments CLIA as qualified to perform high complexity clinical laboratory testing.    MEROPENEM Value in next row Sensitive      <=4 SENSITIVEThis is a modified  FDA-approved test that has been validated and its performance characteristics determined by the reporting laboratory.  This laboratory is certified under the Clinical Laboratory Improvement Amendments CLIA as qualified to perform high complexity clinical laboratory testing.    * >=100,000 COLONIES/mL ESCHERICHIA COLI   Proteus mirabilis - MIC*    AMPICILLIN Value in next row Sensitive      <=4 SENSITIVEThis is a modified FDA-approved test that has been validated and its performance characteristics determined by the reporting laboratory.  This laboratory is certified under the Clinical Laboratory Improvement Amendments CLIA as qualified to perform high complexity clinical laboratory testing.    CEFAZOLIN  (URINE) Value in next row Sensitive      4 SENSITIVEThis is a modified FDA-approved test that has been validated and its performance characteristics determined by the reporting laboratory.  This laboratory is certified under the Clinical Laboratory Improvement Amendments CLIA as qualified to perform high complexity clinical laboratory testing.    CEFEPIME  Value in next row Sensitive      4 SENSITIVEThis is a modified FDA-approved test that has been validated and its performance characteristics determined by the reporting laboratory.  This laboratory is certified under the Clinical Laboratory Improvement Amendments CLIA as qualified to perform high complexity clinical laboratory testing.    ERTAPENEM Value in next row Sensitive      4 SENSITIVEThis is a modified FDA-approved test that has been validated and its performance characteristics determined by the reporting laboratory.  This laboratory is certified under the Clinical Laboratory Improvement Amendments CLIA as qualified to perform high complexity clinical laboratory testing.    CEFTRIAXONE  Value in next row Sensitive      4 SENSITIVEThis is a modified FDA-approved test that has been validated and its performance characteristics determined by the  reporting laboratory.  This laboratory is certified under the Clinical Laboratory Improvement Amendments CLIA as qualified to perform high complexity clinical laboratory testing.    CIPROFLOXACIN Value in next row Sensitive      4 SENSITIVEThis is a modified FDA-approved test that has been validated and its performance characteristics determined by the reporting laboratory.  This laboratory is certified under the Clinical Laboratory Improvement Amendments CLIA as qualified to perform high complexity clinical laboratory testing.    GENTAMICIN Value in next row Sensitive  4 SENSITIVEThis is a modified FDA-approved test that has been validated and its performance characteristics determined by the reporting laboratory.  This laboratory is certified under the Clinical Laboratory Improvement Amendments CLIA as qualified to perform high complexity clinical laboratory testing.    NITROFURANTOIN Value in next row Resistant      4 SENSITIVEThis is a modified FDA-approved test that has been validated and its performance characteristics determined by the reporting laboratory.  This laboratory is certified under the Clinical Laboratory Improvement Amendments CLIA as qualified to perform high complexity clinical laboratory testing.    TRIMETH /SULFA  Value in next row Sensitive      4 SENSITIVEThis is a modified FDA-approved test that has been validated and its performance characteristics determined by the reporting laboratory.  This laboratory is certified under the Clinical Laboratory Improvement Amendments CLIA as qualified to perform high complexity clinical laboratory testing.    AMPICILLIN/SULBACTAM Value in next row Sensitive      4 SENSITIVEThis is a modified FDA-approved test that has been validated and its performance characteristics determined by the reporting laboratory.  This laboratory is certified under the Clinical Laboratory Improvement Amendments CLIA as qualified to perform high complexity clinical  laboratory testing.    PIP/TAZO Value in next row Sensitive      <=4 SENSITIVEThis is a modified FDA-approved test that has been validated and its performance characteristics determined by the reporting laboratory.  This laboratory is certified under the Clinical Laboratory Improvement Amendments CLIA as qualified to perform high complexity clinical laboratory testing.    MEROPENEM Value in next row Sensitive      <=4 SENSITIVEThis is a modified FDA-approved test that has been validated and its performance characteristics determined by the reporting laboratory.  This laboratory is certified under the Clinical Laboratory Improvement Amendments CLIA as qualified to perform high complexity clinical laboratory testing.    * 40,000 COLONIES/mL PROTEUS MIRABILIS    Radiology Report ECHOCARDIOGRAM LIMITED BUBBLE STUDY Result Date: 11/04/2023    ECHOCARDIOGRAM LIMITED REPORT   Patient Name:   Andrea Kirk Date of Exam: 11/04/2023 Medical Rec #:  [id number]     Height:       62.0 in Accession #:    [id number]    Weight:       167.3 lb Date of Birth:  May 25, 1940      BSA:          1.772 m Patient Age:    83 years      BP:           111/82 mmHg Patient Gender: F             HR:           60 bpm. Exam Location:  Inpatient Procedure: Color Doppler, Saline Contrast Bubble Study, Limited Echo and Cardiac            Doppler (Both Spectral and Color Flow Doppler were utilized during            procedure). Indications:    Stroke  History:        Patient has prior history of Echocardiogram examinations, most                 recent 10/22/2023. CAD and Previous Myocardial Infarction,                 Stroke, Signs/Symptoms:Syncope; Risk Factors:Hypertension and                 Dyslipidemia.  Sonographer:  Juliene Rucks Referring Phys: [id number] ZANE ADAMS  Sonographer Comments: Patient is obese. Image acquisition challenging due to patient body habitus. IMPRESSIONS  1. Left ventricular ejection fraction, by estimation, is 65 to  70%. The left ventricle has normal function. The left ventricle has no regional wall motion abnormalities. Left ventricular diastolic parameters are indeterminate.  2. Right ventricular systolic function is mildly reduced. The right ventricular size is mildly enlarged.  3. The aortic valve is tricuspid. There is mild calcification of the aortic valve. Aortic valve regurgitation is not visualized. Aortic valve sclerosis is present, with no evidence of aortic valve stenosis.  4. Agitated saline contrast bubble study was negative, with no evidence of any interatrial shunt. Comparison(s): No significant change from prior study. Prior images reviewed side by side. FINDINGS  Left Ventricle: Left ventricular ejection fraction, by estimation, is 65 to 70%. The left ventricle has normal function. The left ventricle has no regional wall motion abnormalities. The left ventricular internal cavity size was normal in size. There is  no left ventricular hypertrophy. Left ventricular diastolic parameters are indeterminate. Right Ventricle: RV Strain pattern. The right ventricular size is mildly enlarged. Right ventricular systolic function is mildly reduced. Pericardium: Trivial pericardial effusion is present. The pericardial effusion is surrounding the apex. Tricuspid Valve: The tricuspid valve is normal in structure. Tricuspid valve regurgitation is mild . No evidence of tricuspid stenosis. Aortic Valve: The aortic valve is tricuspid. There is mild calcification of the aortic valve. Aortic valve regurgitation is not visualized. Aortic valve sclerosis is present, with no evidence of aortic valve stenosis. Pulmonic Valve: The pulmonic valve was grossly normal. Pulmonic valve regurgitation is not visualized. No evidence of pulmonic stenosis. IAS/Shunts: Agitated saline contrast was given intravenously to evaluate for intracardiac shunting. Agitated saline contrast bubble study was negative, with no evidence of any interatrial  shunt. LEFT VENTRICLE PLAX 2D LVIDd:         4.80 cm LVIDs:         3.30 cm LV PW:         0.90 cm LV IVS:        0.90 cm LVOT diam:     1.90 cm LVOT Area:     2.84 cm  LEFT ATRIUM         Index LA diam:    3.30 cm 1.86 cm/m   AORTA Ao Root diam: 3.00 cm MITRAL VALVE MV Area (PHT): 3.91 cm     SHUNTS MV Decel Time: 194 msec     Systemic Diam: 1.90 cm MV E velocity: 118.00 cm/s MV A velocity: 64.50 cm/s MV E/A ratio:  1.83 Stanly Leavens MD Electronically signed by Stanly Leavens MD Signature Date/Time: 11/04/2023/5:27:13 PM    Final    VAS US  LOWER EXTREMITY VENOUS (DVT) Result Date: 11/04/2023  Lower Venous DVT Study Patient Name:  Andrea Kirk  Date of Exam:   11/04/2023 Medical Rec #: [id number]      Accession #:    [id number] Date of Birth: [birth year]       Patient Gender: F Patient Age:   46 years Exam Location:  [facility] Procedure:      VAS US  LOWER EXTREMITY VENOUS (DVT) Referring Phys: MARSA MELVIN --------------------------------------------------------------------------------  Indications: Stroke.  Limitations: Patient unable to rotate legs secondary to altered mental status, somnolence. Comparison Study: No prior study on file Performing Technologist: Alberta Lis RVS  Examination Guidelines: A complete evaluation includes B-mode imaging, spectral Doppler, color Doppler, and power Doppler as  needed of all accessible portions of each vessel. Bilateral testing is considered an integral part of a complete examination. Limited examinations for reoccurring indications may be performed as noted. The reflux portion of the exam is performed with the patient in reverse Trendelenburg.  +---------+---------------+---------+-----------+----------+-------------------+ RIGHT    CompressibilityPhasicitySpontaneityPropertiesThrombus Aging      +---------+---------------+---------+-----------+----------+-------------------+ CFV      Full           Yes      No                                        +---------+---------------+---------+-----------+----------+-------------------+ SFJ      Full                                                             +---------+---------------+---------+-----------+----------+-------------------+ FV Prox  Full           Yes      No                                       +---------+---------------+---------+-----------+----------+-------------------+ FV Mid   Full                                                             +---------+---------------+---------+-----------+----------+-------------------+ FV DistalFull           Yes      No                                       +---------+---------------+---------+-----------+----------+-------------------+ PFV      Full           Yes      No                                       +---------+---------------+---------+-----------+----------+-------------------+ POP                     Yes      No                   patent by color and                                                       Doppler             +---------+---------------+---------+-----------+----------+-------------------+ PTV      Full                                                             +---------+---------------+---------+-----------+----------+-------------------+  PERO     Full                                                             +---------+---------------+---------+-----------+----------+-------------------+   +---------+---------------+---------+-----------+----------+-------------------+ LEFT     CompressibilityPhasicitySpontaneityPropertiesThrombus Aging      +---------+---------------+---------+-----------+----------+-------------------+ CFV      Full           Yes      No                                       +---------+---------------+---------+-----------+----------+-------------------+ SFJ      Full                                                              +---------+---------------+---------+-----------+----------+-------------------+ FV Prox  Full           Yes      Yes                                      +---------+---------------+---------+-----------+----------+-------------------+ FV Mid   Full                                                             +---------+---------------+---------+-----------+----------+-------------------+ FV Distal               Yes      Yes                  patent by color and                                                       Doppler             +---------+---------------+---------+-----------+----------+-------------------+ PFV      Full           Yes      No                                       +---------+---------------+---------+-----------+----------+-------------------+ POP                                                   patent by color and  Doppler             +---------+---------------+---------+-----------+----------+-------------------+ PTV      Full                                                             +---------+---------------+---------+-----------+----------+-------------------+ PERO     Full                                                             +---------+---------------+---------+-----------+----------+-------------------+     Summary: RIGHT: - No evidence of deep vein thrombosis in the lower extremity. No indirect evidence of obstruction proximal to the inguinal ligament.  - No cystic structure found in the popliteal fossa.  LEFT: - No evidence of deep vein thrombosis in the lower extremity. No indirect evidence of obstruction proximal to the inguinal ligament.  - No cystic structure found in the popliteal fossa.  *See table(s) above for measurements and observations. Electronically signed by Debby Robertson on 11/04/2023 at 4:40:26 PM.    Final    VAS US   CAROTID Result Date: 11/04/2023 Carotid Arterial Duplex Study Patient Name:  Andrea Kirk  Date of Exam:   11/04/2023 Medical Rec #: [id number]      Accession #:    [id number] Date of Birth: [birth year]       Patient Gender: F Patient Age:   62 years Exam Location:  [facility] Procedure:      VAS US  CAROTID Referring Phys: DEVON SHAFER --------------------------------------------------------------------------------  Indications:       Right endarterectomy 03/22/16. Left endarterectomy 04/17/23. Risk Factors:      Hypertension, hyperlipidemia, Diabetes, no history of                    smoking. Comparison Study:  Prior carotid duplex done 01/31/23 indicating 1-39% right ICA                    stenosis and 40-59% left ICA stenosis Performing Technologist: Alberta Lis RVS  Examination Guidelines: A complete evaluation includes B-mode imaging, spectral Doppler, color Doppler, and power Doppler as needed of all accessible portions of each vessel. Bilateral testing is considered an integral part of a complete examination. Limited examinations for reoccurring indications may be performed as noted.  Right Carotid Findings: +----------+--------+--------+--------+------------------+---------+           PSV cm/sEDV cm/sStenosisPlaque DescriptionComments  +----------+--------+--------+--------+------------------+---------+ CCA Prox  165     16                                          +----------+--------+--------+--------+------------------+---------+ CCA Distal84      13                                          +----------+--------+--------+--------+------------------+---------+ ICA Prox  62      8       1-39%   heterogenous  Shadowing +----------+--------+--------+--------+------------------+---------+ ICA Mid   75      15                                tortuous  +----------+--------+--------+--------+------------------+---------+ ICA Distal92      20                                 tortuous  +----------+--------+--------+--------+------------------+---------+ ECA       317     15      >50%                                +----------+--------+--------+--------+------------------+---------+ +----------+--------+-------+--------+-------------------+           PSV cm/sEDV cmsDescribeArm Pressure (mmHG) +----------+--------+-------+--------+-------------------+ [id number]                                        +----------+--------+-------+--------+-------------------+ +---------+--------+--+--------+--+---------+ VertebralPSV cm/s48EDV cm/s12Antegrade +---------+--------+--+--------+--+---------+  Left Carotid Findings: +----------+--------+--------+--------+------------------+---------+           PSV cm/sEDV cm/sStenosisPlaque DescriptionComments  +----------+--------+--------+--------+------------------+---------+ CCA Prox  122     12              calcific                    +----------+--------+--------+--------+------------------+---------+ CCA Distal95      15              heterogenous                +----------+--------+--------+--------+------------------+---------+ ICA Prox  211     32      40-59%  heterogenous      Shadowing +----------+--------+--------+--------+------------------+---------+ ICA Mid   147     32                                tortuous  +----------+--------+--------+--------+------------------+---------+ ICA Distal90      21                                          +----------+--------+--------+--------+------------------+---------+ ECA       161     15                                          +----------+--------+--------+--------+------------------+---------+ +----------+--------+--------+--------+-------------------+           PSV cm/sEDV cm/sDescribeArm Pressure (mmHG) +----------+--------+--------+--------+-------------------+ [id number]                                          +----------+--------+--------+--------+-------------------+ +---------+--------+--+--------+--+--------+ VertebralPSV cm/s32EDV cm/s10Atypical +---------+--------+--+--------+--+--------+   Summary: Right Carotid: Velocities in the right ICA are consistent with a 1-39% stenosis. Left Carotid: Velocities in the left ICA are consistent with a 40-59% stenosis.               Post stenotic waveform noted proximal ICA, unable to match  previous diastolic velocities. Vertebrals:  Right vertebral artery demonstrates antegrade flow. The left              vertebral artery exhibits an atypical waveform. Subclavians: Normal flow hemodynamics were seen in bilateral subclavian              arteries. *See table(s) above for measurements and observations.     Preliminary      Signature  -   Brayton Lye M.D on 11/06/2023 at 12:07 PM   -  To page go to [email or web address]

## 2023-11-06 NOTE — Progress Notes (Signed)
 STROKE TEAM PROGRESS NOTE   INTERIM HISTORY/SUBJECTIVE: No family at bedside.  Patient lying bed, awake alert orientated.  Still has right leg pain due to fracture.  Plan for SNF discharge tomorrow.  Discussed with patient brother regarding loop recorder, he is in agreement.  OBJECTIVE:  CBC    Component Value Date/Time   WBC 5.8 11/06/2023 0218   RBC 2.47 (L) 11/06/2023 0218   HGB 8.3 (L) 11/06/2023 0218   HGB 12.2 05/21/2022 1231   HCT 26.5 (L) 11/06/2023 0218   HCT 36.0 05/21/2022 1231   PLT 204 11/06/2023 0218   PLT 136 (L) 05/21/2022 1231   MCV 107.3 (H) 11/06/2023 0218   MCV 106 (H) 05/21/2022 1231   MCH 33.6 11/06/2023 0218   MCHC 31.3 11/06/2023 0218   RDW 16.6 (H) 11/06/2023 0218   RDW 13.0 05/21/2022 1231   LYMPHSABS 1.6 11/06/2023 0218   MONOABS 0.6 11/06/2023 0218   EOSABS 0.2 11/06/2023 0218   BASOSABS 0.1 11/06/2023 0218    BMET    Component Value Date/Time   NA 140 11/06/2023 0218   NA 142 05/21/2022 1231   K 4.3 11/06/2023 0218   CL 105 11/06/2023 0218   CO2 24 11/06/2023 0218   GLUCOSE 102 (H) 11/06/2023 0218   BUN 19 11/06/2023 0218   BUN 27 05/21/2022 1231   CREATININE 0.79 11/06/2023 0218   CREATININE 0.80 10/05/2019 1427   CALCIUM  9.2 11/06/2023 0218   EGFR 66 05/21/2022 1231   GFRNONAA >60 11/06/2023 0218   GFRNONAA >60 10/05/2019 1427    IMAGING past 24 hours DG HIP UNILAT WITH PELVIS 2-3 VIEWS RIGHT Result Date: 11/06/2023 EXAM: 2 or more VIEW(S) XRAY OF THE RIGHT HIP 11/06/2023 09:07:00 AM COMPARISON: None available. CLINICAL HISTORY: Post op infection [id number]; Status post-operative repair of closed hip fracture [id number] FINDINGS: BONES AND JOINTS: Intramedullary nail fixation of a right intertrochanteric fracture. Mild displacement of the lesser trochanter. SOFT TISSUES: Subcutaneous emphysema and surgical staples are noted. VASCULATURE: Vascular calcifications are present. LUMBAR SPINE: Lower lumbar spine surgical hardware is noted.  IMPRESSION: 1. Intramedullary nail fixation of the right intertrochanteric fracture with mild displacement of the lesser trochanter. 2. Subcutaneous emphysema and surgical staples noted, compatible with recent postoperative state. Electronically signed by: Waddell Calk MD 11/06/2023 12:35 PM EDT RP Workstation: [id number]    Vitals:   11/06/23 0433 11/06/23 0435 11/06/23 0800 11/06/23 1205  BP:   (!) 168/62 (!) 163/65  Pulse: (!) 59 60 66 66  Resp: (!) 23 14 12 16   Temp:   98.7 F (37.1 C) 98.6 F (37 C)  TempSrc:   Oral Oral  SpO2: (!) 87% 95% 96% 96%  Weight:  87.1 kg    Height:         PHYSICAL EXAM: General: Alert, well-nourished, well-developed patient in no acute distress Psych: Mood and affect appropriate for situation CV: Regular rate and rhythm on monitor Respiratory: Regular, unlabored respirations on room air   NEURO:  eyes open, awake, alert, orientated to age, place, time. No aphasia, fluent language, following all simple commands. Able to name and repeat in mildly dysarthric voice. No gaze palsy, tracking bilaterally, visual field full. No facial droop with consideration of left poor denture. Tongue midline. Bilateral UEs 4/5, no significant drift.  Right lower extremity limited movement proximally due to hip fracture, however distally 5/5.  Left lower extremity proximal 3/5 and distal 5/5.SABRA Sensation symmetrical bilaterally, b/l FTN intact although slow, gait not tested.  ASSESSMENT/PLAN:  Andrea Kirk is a 83 y.o. female with history of hypertension, hyperlipidemia, diabetes, CAD s/p CABG, CVA, carotid artery disease s/p left CAE in 2015, right CAE in 2018, CKD 2, GERD, RLS, diastolic CHF, depression, anxiety, chronic respiratory failure, chronic pain admitted for altered mental status. She is s/p IM rod to repair her right hip fracture. Her ASA and brilinta  was stopped for the procedure and she was discharged on full dose lovenox  for DVT ppx. NIH on Admission  8.  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 Pattern suggests a recent embolic event. No hemorrhagic transformation or intracranial mass effect.  MRI: Multifocal acute infarcts with dominant involvement of the left cerebellum in the left PICA territory with associated edema causing mild local mass effect. Additional acute infarcts in the right cerebellum, left frontal lobe, bilateral posterior frontal and parietal lobes, right occipital lobe, and left occipital cortex. MRA: No acute findings. 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 Mild atherosclerotic irregularity of the bilateral carotid siphons.  2D Echo EF >75% Bilateral LE venous US  - negative for DVT Carotid duplex US  - L ICA 40-59% stenosis  Will consider loop recorder on discharge, cardiology EP aware LDL 32 HgbA1c 5.7 VTE prophylaxis - SQ Heparin  Patient taking Lovenox  prophylaxis dose post-hip fracture repair prior to admission, now on aspirin  81 mg daily and plavix  DAPT for 3 weeks and then ASA alone.   Therapy recommendations: SNF Disposition: Pending  History of stroke 04/2020 admitted for left temporal cortex infarct on MRI. CT also showed old cerebellar infarct.  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 CT perfusion negative. EF 50 to 50%.  LDL 82, A1c 6.9. Discharged on aspirin  and Brilinta  and Crestor . Recommend loop recorder but patient prefer to be done as outpatient at that time. However, apparently that was not done since.  Hypertension Home meds: Amlodipine , Furosemide , Valsartan Stable Avoid low BP Long term BP goal of normotension  AKI / UTI/ Uremia Cr 0.84--2.40-2.37-1.52-0.99 IV fluids stopped Avoid nephrotoxic agents On Rocephin   Blood Cx NGTD Urine Cx  E-coli  Transaminitis AST/ALT 1001/433--571/353 --228/223--104/145 Could be related to dehydration and immobility Close monitoring  Hyperlipidemia Home meds: Rosuvastatin  20 mg LDL 32, goal < 70 Continue statin once LFTs normalizes Patient has genetically low HDL  Diabetes type II Controlled Home meds: Jardiance 25 mg, Glipizide  5 mg HgbA1c 5.7, goal < 7.0 CBGs SSI Recommend close follow-up with PCP for better DM control  Dysphagia Patient has post-stroke dysphagia, SLP consulted On dysphagia 3 and thin liquid Advance diet as tolerated  Other Stroke Risk Factors Obesity, Body mass index is 35.12 kg/m., BMI >/= 30 associated with increased stroke risk, recommend weight loss, diet and exercise as appropriate  CAD status post CABG in 1997 PVD, status post bilateral CEA in 2015 on the left and 2018 on the right Diastolic congestive heart failure Advanced age, >/= 39  Other Active Problems Depression/ Anxiety - continue BuSpar  and Celexa  when able Chronic respiratory failure with hypoxia Recent right hip surgery -> discharged to [facility] 10/20  Hospital day # 2  Neurology will sign off. Please call with questions. Pt will follow up with stroke clinic NP at [facility] in about 4 weeks. Thanks for the consult.   Ary Cummins, MD PhD Stroke Neurology 11/06/2023 5:57 PM  To contact Stroke Continuity provider, please refer to [email or web address]. After hours, contact General Neurology

## 2023-11-06 NOTE — Plan of Care (Signed)
  Problem: Education: Goal: Knowledge of disease or condition will improve Outcome: Progressing Goal: Knowledge of secondary prevention will improve (MUST DOCUMENT ALL) Outcome: Progressing Goal: Knowledge of patient specific risk factors will improve (DELETE if not current risk factor) Outcome: Progressing   Problem: Ischemic Stroke/TIA Tissue Perfusion: Goal: Complications of ischemic stroke/TIA will be minimized Outcome: Progressing   Problem: Coping: Goal: Will verbalize positive feelings about self Outcome: Progressing

## 2023-11-06 NOTE — Progress Notes (Signed)
 Physical Therapy Treatment Patient Details Name: Andrea Kirk MRN: [id number] DOB: 1940/05/09 Today's Date: 11/06/2023   History of Present Illness Pt is an 83 y.o. female presenting 10/27 with AMS. 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   PMHx: HTN, HLD, anxiety, CAD, s/p left CAE in 2015, right CAE in 2018, s/p CABG in 1997, anemia, T2DM, GERD, RLS, and CVA.   Recently admitted 10/14-10/20 with right hip fracture status post repair and discharged to SNF.    PT Comments  Pt with fair tolerance to treatment today. +2 Mod/Max A to step pivot transfer to and from [facility]. Limited by pain. No change in DC/DME recs at this time. PT will continue to follow.     If plan is discharge home, recommend the following: Two people to help with walking and/or transfers;Two people to help with bathing/dressing/bathroom;Assistance with cooking/housework;Assist for transportation;Help with stairs or ramp for entrance   Can travel by private vehicle     No  Equipment Recommendations  Hospital bed;Hoyer lift    Recommendations for Other Services       Precautions / Restrictions Precautions Precautions: Fall Recall of Precautions/Restrictions: Impaired Restrictions Weight Bearing Restrictions Per Provider Order: Yes RLE Weight Bearing Per Provider Order: Weight bearing as tolerated     Mobility  Bed Mobility Overal bed mobility: Needs Assistance Bed Mobility: Supine to Sit, Sit to Supine     Supine to sit: +2 for physical assistance, Mod assist Sit to supine: Min assist   General bed mobility comments: +2 Mod A for trunk elevation and RLE assistance to EOB. Min A to return to bed.    Transfers Overall transfer level: Needs assistance Equipment used: 2 person hand held assist Transfers: Sit to/from Stand, Bed to chair/wheelchair/BSC Sit to Stand: +2 physical assistance, Mod assist   Step pivot transfers: +2 physical assistance, Max  assist, Mod assist       General transfer comment: +2 Mod/Max A to step pivot transfer to and from [facility].    Ambulation/Gait               General Gait Details: unable   Stairs             Wheelchair Mobility     Tilt Bed    Modified Rankin (Stroke Patients Only)       Balance Overall balance assessment: Needs assistance Sitting-balance support: Single extremity supported, Bilateral upper extremity supported, Feet supported Sitting balance-Leahy Scale: Fair     Standing balance support: Bilateral upper extremity supported, Reliant on assistive device for balance Standing balance-Leahy Scale: Poor Standing balance comment: Pt dependent on external support of therapist (modA x2) maintained static stance for <30 sec.                            Communication Communication Communication: No apparent difficulties;Impaired  Cognition Arousal: Alert Behavior During Therapy: Flat affect   PT - Cognitive impairments: No family/caregiver present to determine baseline, Attention, Initiation, Sequencing, Safety/Judgement                       PT - Cognition Comments: Pt moaning groaning with movement, anxious asking to lay back down right after sitting up, knew that she was at [facility] Following commands: Impaired Following commands impaired: Follows one step commands with increased time    Cueing Cueing Techniques: Verbal cues, Tactile cues  Exercises  General Comments General comments (skin integrity, edema, etc.): VSS      Pertinent Vitals/Pain Pain Assessment Pain Assessment: Faces Faces Pain Scale: Hurts whole lot Pain Location: R hip Pain Descriptors / Indicators: Grimacing, Moaning, Guarding, Crying Pain Intervention(s): Limited activity within patient's tolerance, Monitored during session, Patient requesting pain meds-RN notified, Repositioned    Home Living                          Prior Function            PT  Goals (current goals can now be found in the care plan section) Progress towards PT goals: Progressing toward goals    Frequency    Min 2X/week      PT Plan      Co-evaluation              AM-PAC PT 6 Clicks Mobility   Outcome Measure  Help needed turning from your back to your side while in a flat bed without using bedrails?: Total Help needed moving from lying on your back to sitting on the side of a flat bed without using bedrails?: Total Help needed moving to and from a bed to a chair (including a wheelchair)?: Total Help needed standing up from a chair using your arms (e.g., wheelchair or bedside chair)?: Total Help needed to walk in hospital room?: Total Help needed climbing 3-5 steps with a railing? : Total 6 Click Score: 6    End of Session Equipment Utilized During Treatment: Oxygen  Activity Tolerance: Patient limited by pain Patient left: in bed;with call bell/phone within reach;with nursing/sitter in room Nurse Communication: Mobility status PT Visit Diagnosis: Muscle weakness (generalized) (M62.81);Pain;Other abnormalities of gait and mobility (R26.89);Unsteadiness on feet (R26.81);Difficulty in walking, not elsewhere classified (R26.2) Pain - Right/Left: Right Pain - part of body: Hip     Time: 9040-8984 PT Time Calculation (min) (ACUTE ONLY): 16 min  Charges:    $Therapeutic Activity: 8-22 mins PT General Charges $$ ACUTE PT VISIT: 1 Visit                     [name], PT, DPT Acute Rehab Services [phone]    Shaneca Orne 11/06/2023, 11:18 AM

## 2023-11-06 NOTE — Progress Notes (Addendum)
 Orthopaedic Progress Note  S: The patient presents 2 weeks after intramedullary fixation of her right hip fracture.  She had been transferred to rehab facility.  She was found to have altered mental status.  She was admitted back to the hospital and found to have acute renal failure with a urinary tract infection.  Currently she is resting in bed.  She reports discomfort in her right hip but is tolerable for her.  She reports ambulating with therapy  O:  Vitals:   11/06/23 0433 11/06/23 0435  BP:    Pulse: (!) 59 60  Resp: (!) 23 14  Temp:    SpO2: (!) 87% 95%   Healed surgical incisions on the right hip.  No erythema or drainage.  Some bruising around the area.  Thigh is swollen but soft and compressible.  No calf tenderness.  5 out of 5 strength EHL, FHL, gastrocs, GS anterior.  Intact sensation in the saphenous, sural, tibial, and peroneal nerve distributions  Imaging: Will obtain x-rays of the right hip  Labs:  Results for orders placed or performed during the hospital encounter of 11/03/23 (from the past 24 hours)  Glucose, capillary     Status: Abnormal   Collection Time: 11/05/23  8:46 AM  Result Value Ref Range   Glucose-Capillary 100 (H) 70 - 99 mg/dL  Glucose, capillary     Status: Abnormal   Collection Time: 11/05/23  1:01 PM  Result Value Ref Range   Glucose-Capillary 119 (H) 70 - 99 mg/dL  Glucose, capillary     Status: Abnormal   Collection Time: 11/05/23  4:36 PM  Result Value Ref Range   Glucose-Capillary 127 (H) 70 - 99 mg/dL  Glucose, capillary     Status: Abnormal   Collection Time: 11/05/23  7:14 PM  Result Value Ref Range   Glucose-Capillary 121 (H) 70 - 99 mg/dL   Comment 1 Notify RN    Comment 2 Document in Chart   CBC with Differential/Platelet     Status: Abnormal   Collection Time: 11/06/23  2:18 AM  Result Value Ref Range   WBC 5.8 4.0 - 10.5 K/uL   RBC 2.47 (L) 3.87 - 5.11 MIL/uL   Hemoglobin 8.3 (L) 12.0 - 15.0 g/dL   HCT 73.4 (L) 63.9 - 53.9 %    MCV 107.3 (H) 80.0 - 100.0 fL   MCH 33.6 26.0 - 34.0 pg   MCHC 31.3 30.0 - 36.0 g/dL   RDW 83.3 (H) 88.4 - 84.4 %   Platelets 204 150 - 400 K/uL   nRBC 0.0 0.0 - 0.2 %   Neutrophils Relative % 57 %   Neutro Abs 3.4 1.7 - 7.7 K/uL   Lymphocytes Relative 27 %   Lymphs Abs 1.6 0.7 - 4.0 K/uL   Monocytes Relative 11 %   Monocytes Absolute 0.6 0.1 - 1.0 K/uL   Eosinophils Relative 3 %   Eosinophils Absolute 0.2 0.0 - 0.5 K/uL   Basophils Relative 1 %   Basophils Absolute 0.1 0.0 - 0.1 K/uL   Immature Granulocytes 1 %   Abs Immature Granulocytes 0.03 0.00 - 0.07 K/uL  Comprehensive metabolic panel with GFR     Status: Abnormal   Collection Time: 11/06/23  2:18 AM  Result Value Ref Range   Sodium 140 135 - 145 mmol/L   Potassium 4.3 3.5 - 5.1 mmol/L   Chloride 105 98 - 111 mmol/L   CO2 24 22 - 32 mmol/L   Glucose, Bld 102 (  H) 70 - 99 mg/dL   BUN 19 8 - 23 mg/dL   Creatinine, Ser 9.20 0.44 - 1.00 mg/dL   Calcium  9.2 8.9 - 10.3 mg/dL   Total Protein 5.9 (L) 6.5 - 8.1 g/dL   Albumin 2.3 (L) 3.5 - 5.0 g/dL   AST 895 (H) 15 - 41 U/L   ALT 145 (H) 0 - 44 U/L   Alkaline Phosphatase 80 38 - 126 U/L   Total Bilirubin 0.9 0.0 - 1.2 mg/dL   GFR, Estimated >39 >39 mL/min   Anion gap 11 5 - 15  Magnesium      Status: None   Collection Time: 11/06/23  2:18 AM  Result Value Ref Range   Magnesium  2.1 1.7 - 2.4 mg/dL    Assessment: 2 weeks status post intramedullary fixation right hip  The patient is 2 weeks after surgery.  With regards to the hip she is doing well.  Incision of healed nicely.  Will remove the staples.  We will get x-rays to assess normal healing.  Will we will review these and return with any further recommendations.  In the meantime, she can continue to weight-bear as tolerated.  Continue with DVT prophylaxis.  She can follow-up with me as an outpatient in 6 weeks time.   X-rays show acceptable alignment of the right intertrochanteric hip fracture with acceptable position  of the rod and screws  Cordella Rhein, MD, MS [facility] [phone]

## 2023-11-07 ENCOUNTER — Encounter (HOSPITAL_COMMUNITY)
Admission: EM | Disposition: A | Discharge status: Skilled Nursing Facility | Payer: Self-pay | Source: Skilled Nursing Facility | Attending: Internal Medicine

## 2023-11-07 DIAGNOSIS — I639 Cerebral infarction, unspecified: Secondary | ICD-10-CM | POA: Diagnosis not present

## 2023-11-07 HISTORY — PX: LOOP RECORDER INSERTION: EP1214

## 2023-11-07 LAB — COMPREHENSIVE METABOLIC PANEL WITH GFR
ALT: 101 U/L — ABNORMAL HIGH (ref 0–44)
AST: 62 U/L — ABNORMAL HIGH (ref 15–41)
Albumin: 2.4 g/dL — ABNORMAL LOW (ref 3.5–5.0)
Alkaline Phosphatase: 82 U/L (ref 38–126)
Anion gap: 14 (ref 5–15)
BUN: 12 mg/dL (ref 8–23)
CO2: 22 mmol/L (ref 22–32)
Calcium: 9.3 mg/dL (ref 8.9–10.3)
Chloride: 103 mmol/L (ref 98–111)
Creatinine, Ser: 0.75 mg/dL (ref 0.44–1.00)
GFR, Estimated: >60 mL/min
Glucose, Bld: 129 mg/dL — ABNORMAL HIGH (ref 70–99)
Potassium: 4.8 mmol/L (ref 3.5–5.1)
Sodium: 139 mmol/L (ref 135–145)
Total Bilirubin: 1.4 mg/dL — ABNORMAL HIGH (ref 0.0–1.2)
Total Protein: 5.8 g/dL — ABNORMAL LOW (ref 6.5–8.1)

## 2023-11-07 LAB — CBC WITH DIFFERENTIAL/PLATELET
Abs Immature Granulocytes: 0.03 K/uL (ref 0.00–0.07)
Basophils Absolute: 0.1 K/uL (ref 0.0–0.1)
Basophils Relative: 1 %
Eosinophils Absolute: 0.2 K/uL (ref 0.0–0.5)
Eosinophils Relative: 3 %
HCT: 30.9 % — ABNORMAL LOW (ref 36.0–46.0)
Hemoglobin: 9.4 g/dL — ABNORMAL LOW (ref 12.0–15.0)
Immature Granulocytes: 1 %
Lymphocytes Relative: 29 %
Lymphs Abs: 1.6 K/uL (ref 0.7–4.0)
MCH: 33.7 pg (ref 26.0–34.0)
MCHC: 30.4 g/dL (ref 30.0–36.0)
MCV: 110.8 fL — ABNORMAL HIGH (ref 80.0–100.0)
Monocytes Absolute: 0.5 K/uL (ref 0.1–1.0)
Monocytes Relative: 10 %
Neutro Abs: 3.1 K/uL (ref 1.7–7.7)
Neutrophils Relative %: 56 %
Platelets: 185 K/uL (ref 150–400)
RBC: 2.79 MIL/uL — ABNORMAL LOW (ref 3.87–5.11)
RDW: 16.6 % — ABNORMAL HIGH (ref 11.5–15.5)
WBC: 5.5 K/uL (ref 4.0–10.5)
nRBC: 0 % (ref 0.0–0.2)

## 2023-11-07 LAB — MAGNESIUM: Magnesium: 2.2 mg/dL (ref 1.7–2.4)

## 2023-11-07 LAB — GLUCOSE, CAPILLARY
Glucose-Capillary: 137 mg/dL — ABNORMAL HIGH (ref 70–99)
Glucose-Capillary: 152 mg/dL — ABNORMAL HIGH (ref 70–99)

## 2023-11-07 SURGERY — LOOP RECORDER INSERTION

## 2023-11-07 MED ORDER — GABAPENTIN 300 MG PO CAPS: 300.0000 mg | ORAL_CAPSULE | Freq: Two times a day (BID) | ORAL | Status: AC

## 2023-11-07 MED ORDER — LIDOCAINE-EPINEPHRINE 1 %-1:100000 IJ SOLN
INTRAMUSCULAR | Status: AC
  Filled 2023-11-07: qty 1

## 2023-11-07 MED ORDER — POLYETHYLENE GLYCOL 3350 17 G PO PACK: 17.0000 g | PACK | Freq: Every day | ORAL | Status: AC | PRN

## 2023-11-07 MED ORDER — OXYCODONE HCL 5 MG PO TABS
5.0000 mg | ORAL_TABLET | Freq: Once | ORAL | Status: AC
  Administered 2023-11-07: 5 mg via ORAL
  Filled 2023-11-07: qty 1

## 2023-11-07 MED ORDER — PANTOPRAZOLE SODIUM 40 MG PO TBEC: 40.0000 mg | DELAYED_RELEASE_TABLET | Freq: Two times a day (BID) | ORAL | Status: AC

## 2023-11-07 MED ORDER — LIDOCAINE-EPINEPHRINE 1 %-1:100000 IJ SOLN
INTRAMUSCULAR | Status: DC | PRN
  Administered 2023-11-07: 15 mL

## 2023-11-07 MED ORDER — OXYCODONE HCL 5 MG PO TABS: 5.0000 mg | ORAL_TABLET | Freq: Four times a day (QID) | ORAL | 0 refills | Status: AC | PRN

## 2023-11-07 MED ORDER — OYSTER SHELL CALCIUM/D3 500-5 MG-MCG PO TABS: 1.0000 | ORAL_TABLET | Freq: Two times a day (BID) | ORAL | Status: AC

## 2023-11-07 MED ORDER — BUSPIRONE HCL 10 MG PO TABS: 10.0000 mg | ORAL_TABLET | Freq: Two times a day (BID) | ORAL | Status: AC

## 2023-11-07 MED ORDER — ENOXAPARIN SODIUM 40 MG/0.4ML IJ SOSY: 40.0000 mg | PREFILLED_SYRINGE | INTRAMUSCULAR | 0 refills | Status: DC

## 2023-11-07 MED ORDER — CLOPIDOGREL BISULFATE 75 MG PO TABS: 75.0000 mg | ORAL_TABLET | Freq: Every day | ORAL | Status: DC

## 2023-11-07 MED ORDER — FOSFOMYCIN TROMETHAMINE 3 G PO PACK
3.0000 g | PACK | Freq: Once | ORAL | Status: AC
  Administered 2023-11-07: 3 g via ORAL
  Filled 2023-11-07: qty 3

## 2023-11-07 MED ORDER — LACTATED RINGERS IV SOLN: INTRAVENOUS | Status: DC

## 2023-11-07 MED ORDER — INSULIN ASPART 100 UNIT/ML IJ SOLN: 0.0000 [IU] | Freq: Three times a day (TID) | INTRAMUSCULAR | Status: AC

## 2023-11-07 MED ORDER — ALBUTEROL SULFATE (2.5 MG/3ML) 0.083% IN NEBU: 2.5000 mg | INHALATION_SOLUTION | Freq: Four times a day (QID) | RESPIRATORY_TRACT | Status: AC | PRN

## 2023-11-07 MED ORDER — BISACODYL 10 MG RE SUPP: 10.0000 mg | Freq: Every day | RECTAL | Status: AC | PRN

## 2023-11-07 SURGICAL SUPPLY — 2 items
MONITOR LUX-DX II+ M312 (Prosthesis & Implant Heart) IMPLANT
PACK LOOP INSERTION (CUSTOM PROCEDURE TRAY) ×1 IMPLANT

## 2023-11-07 NOTE — Discharge Summary (Addendum)
 Physician Discharge Summary  Andrea Kirk FMW:[id number] DOB: 09-Aug-1940 DOA: 11/03/2023  PCP: System, Provider Not In  Admit date: 11/03/2023 Discharge date: 11/07/2023  Admitted From: (SNF) Disposition:  (SNF)  Recommendations for Outpatient Follow-up:  Please obtain BMP/CBC/LFT in one week Avoid Jardiance in the future Continue with Lovenox  for DVT prophylaxis x 14 days then discontinue given recent hip surgery. Please ensure to give laxatives to avoid constipation. Please keep encouraging to use incentive spirometer.   Diet recommendation: Heart Healthy / Carb Modified   Brief/Interim Summary:  83 y.o. female with medical history significant of hypertension, hyperlipidemia, diabetes, CAD status post CABG, CVA, carotid artery disease, CKD 2, GERD, RLS, chronic diastolic CHF, depression, anxiety, chronic respiratory failure, chronic pain presenting with altered mental status. - Patient recently admitted 10/14-10/20 with right hip fracture status post repair and discharged to SNF.   She was doing well until 2 days ago.  In her usual state of health and had been able to ambulate on her operative extremity.     Developed altered mental status starting yesterday.  Had outpatient lab workup showing AKI.  Brought to the ED for further evaluation today.  In the ER workup suggestive of diffuse embolic stroke, AKI, shock liver and mild demand ischemia.  Seen by neurology and cardiology and admitted to the hospital.  Please see discussion below.   Acute metabolic encephalopathy  - Multifactorial, due to acute CVA, UTI, shock liver and dehydration/AKI . - Mentation back at baseline.   Acute CVA . -MRI: Multifocal acute infarcts with dominant involvement of the left cerebellum in the left PICA territory with associated edema causing mild local mass effect. Additional acute infarcts in the right cerebellum, left frontal lobe, bilateral posterior frontal and parietal lobes, right occipital  lobe, and left occipital cortex. -MRA: No acute findings. 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 Mild atherosclerotic irregularity of the bilateral carotid siphons.  -2D Echo EF >75%, no evidence of intra-atrial shunt -Bilateral LE venous US  - negative for DVT -Carotid duplex US  - L ICA 40-59% stenosis  -Neurology input greatly appreciated, concern for embolic CVA, plan for loop recorder, which was applied this morning prior to discharge. -Patient taking Lovenox  post-hip fracture repair prior to admission, now on aspirin  81 mg daily and Plavix  for 3 weeks then aspirin  alone. -Therapy recommendations:  SNF   AKI Uremia Elevated anion gap acidosis Due to dehydration improving Resolved with IV fluids continue to hold diuretics  Acute metabolic encephalopathy  - Multifactorial, due to acute CVA, UTI, shock liver and dehydration/AKI . - Mentation back at baseline.     CAD Elevated troponins due to demand ischemia/type II NSTEMI Elevated BNP -History of CABG.  Noted to have troponin elevated to 779 and 841 on repeat. -  Demand ischemia from dehydration hypotension, also had shock liver and AKI. - Cardiology input greatly appreciated, no concern for ACS. - 2D echo with a preserved EF, no evidence of intra-atrial shunt. -See above discussion regarding aspirin  and Plavix    ESBL UTI Urinary retention Culture growing ESBL Proteus and E. coli  - Symptomatic with suprapubic tenderness, so she was treated with meropenem, and received 1 dose of nitrofurantoin prior to discharge  - Foley catheter discontinued 10/30 with no evidence of recurrent retention - will hold Jardiane on discharge, please avoid in the future   Transaminitis Presentation consistent with shock liver, trend is improving, also appears to have undiagnosed cirrhosis per CT imaging, outpatient GI follow-up could have  NASH. -LFTs trending down -Resume statin once repeat LFTs within normal limit.   Right hip  surgery 3 weeks ago.  Continue PT OT discharge to SNF once better.  - With pain in right hip postop area, site looks clean, orthopedic Input greatly appreciated, staples to be removed prior to discharge .  Will continue with DVT prophylaxis another 14 days.   Chronic diastolic CHF Stable and compensated in fact was slightly dehydrated.  Cardiology on board.   Hypertension - Permissive hypertension initially, now we will resume home amlodipine  on discharge as blood pressure started to increase and she is 4 days out of her stroke.   - Does not appear to be on Coreg  anymore, but I will hold on starting it especially intermittent heart rate in the 50s.   Hyperlipidemia - Resume statin as an outpatient when LFTs back to normal    Carotid artery disease - On statin, and antiplatelets per neurology   Depression Anxiety - Continue with Celexa    Chronic respiratory failure with hypoxia - Noted, at baseline 2 L nasal cannula, please encouraged use incentive spirometer   Constipation - on bowel regimen   Diabetes - Continue with glipizide  on discharge, and insulin  sliding scale -Discontinue Jardiance due to UTI     Discharge Diagnoses:  Principal Problem:   Acute CVA (cerebrovascular accident) (HCC) Active Problems:   Coronary artery disease   Carotid artery disease   AKI (acute kidney injury)   Acute renal failure superimposed on stage 2 chronic kidney disease   Depression   Chronic pain   hx of CVA (cerebral vascular accident) (HCC)   Chronic back pain   Controlled type 2 diabetes mellitus with hyperglycemia (HCC)   Essential hypertension   HLD (hyperlipidemia)   CKD (chronic kidney disease) stage 2, GFR 60-89 ml/min   Acute encephalopathy   Anxiety and depression   Chronic respiratory failure with hypoxia (HCC)   Abnormal transaminases   High anion gap metabolic acidosis   Uremia   NSTEMI (non-ST elevated myocardial infarction) (HCC)   Acute on chronic diastolic CHF  (congestive heart failure) [facility])    Discharge Instructions  Discharge Instructions     Ambulatory referral to Neurology   Complete by: As directed    Follow up with stroke clinic NP at [facility] in about 4-6 weeks. Thanks.   Diet - low sodium heart healthy   Complete by: As directed    Discharge instructions   Complete by: As directed    Follow with SNF physician  Get CBC, CMP, checked  by Primary MD next visit.    Activity: As tolerated with Full fall precautions use walker/cane & assistance as needed   Disposition Home    Diet: Heart Healthy ** , with feeding assistance and aspiration precautions.  For Heart failure patients - Check your Weight same time everyday, if you gain over 2 pounds, or you develop in leg swelling, experience more shortness of breath or chest pain, call your Primary MD immediately. Follow Cardiac Low Salt Diet and 1.5 lit/day fluid restriction.   On your next visit with your primary care physician please Get Medicines reviewed and adjusted.   Please request your Prim.MD to go over all Hospital Tests and Procedure/Radiological results at the follow up, please get all Hospital records sent to your Prim MD by signing hospital release before you go home.   If you experience worsening of your admission symptoms, develop shortness of breath, life threatening emergency, suicidal or homicidal thoughts you  must seek medical attention immediately by calling 911 or calling your MD immediately  if symptoms less severe.  You Must read complete instructions/literature along with all the possible adverse reactions/side effects for all the Medicines you take and that have been prescribed to you. Take any new Medicines after you have completely understood and accpet all the possible adverse reactions/side effects.   Do not drive, operating heavy machinery, perform activities at heights, swimming or participation in water activities or provide baby sitting services if your  were admitted for syncope or siezures until you have seen by Primary MD or a Neurologist and advised to do so again.  Do not drive when taking Pain medications.    Do not take more than prescribed Pain, Sleep and Anxiety Medications  Special Instructions: If you have smoked or chewed Tobacco  in the last 2 yrs please stop smoking, stop any regular Alcohol   and or any Recreational drug use.  Wear Seat belts while driving.   Please note  You were cared for by a hospitalist during your hospital stay. If you have any questions about your discharge medications or the care you received while you were in the hospital after you are discharged, you can call the unit and asked to speak with the hospitalist on call if the hospitalist that took care of you is not available. Once you are discharged, your primary care physician will handle any further medical issues. Please note that NO REFILLS for any discharge medications will be authorized once you are discharged, as it is imperative that you return to your primary care physician (or establish a relationship with a primary care physician if you do not have one) for your aftercare needs so that they can reassess your need for medications and monitor your lab values.   Discharge wound care:   Complete by: As directed    Please see instructions on the discharge recommendation by cardiology   Increase activity slowly   Complete by: As directed       Allergies as of 11/07/2023       Reactions   Latex Other (See Comments), Rash, Hives   tears skin   Codeine Other (See Comments)   Abnormal behavior   Morphine  And Codeine Other (See Comments)   Affects BP and blood sugar.   Cyclobenzaprine Other (See Comments)   MADE PT SICK- pt unsure if med was cyclobenzaprine or methocarbamol         Medication List     STOP taking these medications    acetaminophen  500 MG tablet Commonly known as: TYLENOL    diphenhydrAMINE -zinc acetate cream Commonly  known as: BENADRYL    estradiol 0.1 MG/GM vaginal cream Commonly known as: ESTRACE   fluticasone 0.005 % ointment Commonly known as: CUTIVATE   hyoscyamine  0.125 MG SL tablet Commonly known as: LEVSIN  SL   Jardiance 25 MG Tabs tablet Generic drug: empagliflozin   levocetirizine 5 MG tablet Commonly known as: XYZAL   meclizine  25 MG tablet Commonly known as: ANTIVERT    mupirocin  ointment 2 % Commonly known as: BACTROBAN    nystatin powder   omeprazole 40 MG capsule Commonly known as: PRILOSEC   rosuvastatin  20 MG tablet Commonly known as: CRESTOR    SALINE NASAL SPRAY NA   sodium zirconium cyclosilicate  10 g Pack packet Commonly known as: LOKELMA    valsartan 80 MG tablet Commonly known as: DIOVAN       TAKE these medications    albuterol  (2.5 MG/3ML) 0.083% nebulizer solution Commonly known as:  PROVENTIL  Inhale 3 mLs (2.5 mg total) into the lungs every 6 (six) hours as needed for wheezing or shortness of breath.   amLODipine  10 MG tablet Commonly known as: NORVASC  Take 10 mg by mouth daily.   aspirin  EC 81 MG tablet Take 81 mg by mouth every evening.   bisacodyl 10 MG suppository Commonly known as: DULCOLAX Place 1 suppository (10 mg total) rectally daily as needed for severe constipation.   busPIRone  10 MG tablet Commonly known as: BUSPAR  Take 1 tablet (10 mg total) by mouth 2 (two) times daily. What changed: when to take this   calcium  carbonate 750 MG chewable tablet Commonly known as: TUMS EX Chew 1 tablet by mouth daily as needed for heartburn.   calcium -vitamin D 500-5 MG-MCG tablet Commonly known as: OSCAL WITH D Take 1 tablet by mouth 2 (two) times daily with a meal.   carvedilol  6.25 MG tablet Commonly known as: COREG  Take 1 tablet (6.25 mg total) by mouth 2 (two) times daily. What changed: when to take this   chlorhexidine  4 % external liquid Commonly known as: HIBICLENS  Apply 15 mLs (1 Application total) topically as directed for  30 doses. Use as directed daily for 5 days every other week for 6 weeks.   citalopram  20 MG tablet Commonly known as: CELEXA  Take 20 mg by mouth daily.   clopidogrel  75 MG tablet Commonly known as: PLAVIX  Take 1 tablet (75 mg total) by mouth daily for 17 days. Last dose on 11/24/2023   cyanocobalamin  1000 MCG tablet Take 1 tablet (1,000 mcg total) by mouth daily. What changed:  when to take this additional instructions   docusate sodium  100 MG capsule Commonly known as: COLACE Take 100 mg by mouth 2 (two) times daily.   enoxaparin  40 MG/0.4ML injection Commonly known as: LOVENOX  Inject 0.4 mLs (40 mg total) into the skin daily for 15 days. Please continue for next 15 days What changed: additional instructions   ferrous sulfate  325 (65 FE) MG tablet Take 325 mg by mouth daily. Monday-Friday   furosemide  20 MG tablet Commonly known as: LASIX  Take 1 tablet (20 mg total) by mouth daily as needed for fluid or edema. take once daily as needed for weight gain of 2-3 lbs overnight or 5 lbs in 1 week.   gabapentin  300 MG capsule Commonly known as: NEURONTIN  Take 1 capsule (300 mg total) by mouth 2 (two) times daily. What changed: when to take this   glipiZIDE  5 MG 24 hr tablet Commonly known as: GLUCOTROL  XL Take 1 tablet (5 mg total) by mouth daily with breakfast.   insulin  aspart 100 UNIT/ML injection Commonly known as: novoLOG  Inject 0-15 Units into the skin 3 (three) times daily with meals. 0-15 Units, Subcutaneous, 3 times daily with meals, First dose on Mon 11/03/23 at 1700 Correction coverage: Moderate (average weight, post-op) CBG < 70: Implement Hypoglycemia Standing Orders and refer to Hypoglycemia Standing Orders sidebar report CBG 70 - 120: 0 units CBG 121 - 150: 2 units CBG 151 - 200: 3 units CBG 201 - 250: 5 units CBG 251 - 300: 8 units CBG 301 - 350: 11 units CBG 351 - 400: 15 units   Magnesium  Oxide -Mg Supplement 250 MG Tabs Take 250 mg by mouth daily.    melatonin 3 MG Tabs tablet Take 3 mg by mouth at bedtime.   methocarbamol  500 MG tablet Commonly known as: ROBAXIN  Take 500 mg by mouth every 8 (eight) hours as needed for muscle  spasms. What changed: Another medication with the same name was removed. Continue taking this medication, and follow the directions you see here.   oxybutynin  10 MG 24 hr tablet Commonly known as: DITROPAN -XL Take 10 mg by mouth daily.   oxyCODONE  5 MG immediate release tablet Commonly known as: Oxy IR/ROXICODONE  Take 1 tablet (5 mg total) by mouth every 6 (six) hours as needed for severe pain (pain score 7-10). What changed: when to take this   OXYGEN  Inhale 2 L/min into the lungs continuous.   pantoprazole  40 MG tablet Commonly known as: PROTONIX  Take 1 tablet (40 mg total) by mouth 2 (two) times daily. What changed: when to take this   polyethylene glycol 17 g packet Commonly known as: MIRALAX / GLYCOLAX Take 17 g by mouth daily as needed for moderate constipation.   REFRESH TEARS OP Place 1 drop into both eyes every 4 (four) hours as needed (for dry eye).   saccharomyces boulardii 250 MG capsule Commonly known as: FLORASTOR Take 250 mg by mouth daily.   senna 8.6 MG tablet Commonly known as: SENOKOT Take 2 tablets by mouth at bedtime.               Discharge Care Instructions  (From admission, onward)           Start     Ordered   11/07/23 0000  Discharge wound care:       Comments: Please see instructions on the discharge recommendation by cardiology   11/07/23 1125            Follow-up Information     [facility]. Schedule an appointment as soon as possible for a visit in 1 month(s).   Specialty: Neurology Why: stroke clinic Contact information: [address] [place]  [address] [phone]               Allergies  Allergen Reactions   Latex Other (See Comments), Rash and Hives    tears skin    Codeine Other (See Comments)    Abnormal behavior   Morphine  And Codeine Other (See Comments)    Affects BP and blood sugar.   Cyclobenzaprine Other (See Comments)    MADE PT SICK- pt unsure if med was cyclobenzaprine or methocarbamol     Consultations: Cardiology/EP cardiology Neurology   Procedures/Studies: EP PPM/ICD IMPLANT Result Date: 11/07/2023 CONCLUSIONS:  1. Successful implantation of a implantable loop recorder for a history of cryptogenic stroke  2. No early apparent complications. [name], PA-C Cardiac Electrophysiology     DG HIP UNILAT WITH PELVIS 2-3 VIEWS RIGHT Result Date: 11/06/2023 EXAM: 2 or more VIEW(S) XRAY OF THE RIGHT HIP 11/06/2023 09:07:00 AM COMPARISON: None available. CLINICAL HISTORY: Post op infection [id number]; Status post-operative repair of closed hip fracture [id number] FINDINGS: BONES AND JOINTS: Intramedullary nail fixation of a right intertrochanteric fracture. Mild displacement of the lesser trochanter. SOFT TISSUES: Subcutaneous emphysema and surgical staples are noted. VASCULATURE: Vascular calcifications are present. LUMBAR SPINE: Lower lumbar spine surgical hardware is noted. IMPRESSION: 1. Intramedullary nail fixation of the right intertrochanteric fracture with mild displacement of the lesser trochanter. 2. Subcutaneous emphysema and surgical staples noted, compatible with recent postoperative state. Electronically signed by: Waddell Calk MD 11/06/2023 12:35 PM EDT RP Workstation: [id number]   ECHOCARDIOGRAM LIMITED BUBBLE STUDY Result Date: 11/04/2023    ECHOCARDIOGRAM LIMITED REPORT   Patient Name:   Andrea Kirk Date of Exam: 11/04/2023 Medical Rec #:  [id number]  Height:       62.0 in Accession #:    [id number]    Weight:       167.3 lb Date of Birth:  03-21-1940      BSA:          1.772 m Patient Age:    83 years      BP:           111/82 mmHg Patient Gender: F             HR:           60 bpm. Exam Location:  Inpatient  Procedure: Color Doppler, Saline Contrast Bubble Study, Limited Echo and Cardiac            Doppler (Both Spectral and Color Flow Doppler were utilized during            procedure). Indications:    Stroke  History:        Patient has prior history of Echocardiogram examinations, most                 recent 10/22/2023. CAD and Previous Myocardial Infarction,                 Stroke, Signs/Symptoms:Syncope; Risk Factors:Hypertension and                 Dyslipidemia.  Sonographer:    Juliene Rucks Referring Phys: [id number] ZANE ADAMS  Sonographer Comments: Patient is obese. Image acquisition challenging due to patient body habitus. IMPRESSIONS  1. Left ventricular ejection fraction, by estimation, is 65 to 70%. The left ventricle has normal function. The left ventricle has no regional wall motion abnormalities. Left ventricular diastolic parameters are indeterminate.  2. Right ventricular systolic function is mildly reduced. The right ventricular size is mildly enlarged.  3. The aortic valve is tricuspid. There is mild calcification of the aortic valve. Aortic valve regurgitation is not visualized. Aortic valve sclerosis is present, with no evidence of aortic valve stenosis.  4. Agitated saline contrast bubble study was negative, with no evidence of any interatrial shunt. Comparison(s): No significant change from prior study. Prior images reviewed side by side. FINDINGS  Left Ventricle: Left ventricular ejection fraction, by estimation, is 65 to 70%. The left ventricle has normal function. The left ventricle has no regional wall motion abnormalities. The left ventricular internal cavity size was normal in size. There is  no left ventricular hypertrophy. Left ventricular diastolic parameters are indeterminate. Right Ventricle: RV Strain pattern. The right ventricular size is mildly enlarged. Right ventricular systolic function is mildly reduced. Pericardium: Trivial pericardial effusion is present. The pericardial effusion  is surrounding the apex. Tricuspid Valve: The tricuspid valve is normal in structure. Tricuspid valve regurgitation is mild . No evidence of tricuspid stenosis. Aortic Valve: The aortic valve is tricuspid. There is mild calcification of the aortic valve. Aortic valve regurgitation is not visualized. Aortic valve sclerosis is present, with no evidence of aortic valve stenosis. Pulmonic Valve: The pulmonic valve was grossly normal. Pulmonic valve regurgitation is not visualized. No evidence of pulmonic stenosis. IAS/Shunts: Agitated saline contrast was given intravenously to evaluate for intracardiac shunting. Agitated saline contrast bubble study was negative, with no evidence of any interatrial shunt. LEFT VENTRICLE PLAX 2D LVIDd:         4.80 cm LVIDs:         3.30 cm LV PW:         0.90 cm LV IVS:  0.90 cm LVOT diam:     1.90 cm LVOT Area:     2.84 cm  LEFT ATRIUM         Index LA diam:    3.30 cm 1.86 cm/m   AORTA Ao Root diam: 3.00 cm MITRAL VALVE MV Area (PHT): 3.91 cm     SHUNTS MV Decel Time: 194 msec     Systemic Diam: 1.90 cm MV E velocity: 118.00 cm/s MV A velocity: 64.50 cm/s MV E/A ratio:  1.83 Stanly Leavens MD Electronically signed by Stanly Leavens MD Signature Date/Time: 11/04/2023/5:27:13 PM    Final    VAS US  LOWER EXTREMITY VENOUS (DVT) Result Date: 11/04/2023  Lower Venous DVT Study Patient Name:  HENLEY BLYTH [place]  Date of Exam:   11/04/2023 Medical Rec #: [id number]      Accession #:    [id number] Date of Birth: 05-15-1940       Patient Gender: F Patient Age:   37 years Exam Location:  [facility] Procedure:      VAS US  LOWER EXTREMITY VENOUS (DVT) Referring Phys: MARSA MELVIN --------------------------------------------------------------------------------  Indications: Stroke.  Limitations: Patient unable to rotate legs secondary to altered mental status, somnolence. Comparison Study: No prior study on file Performing Technologist: Alberta Lis RVS  Examination  Guidelines: A complete evaluation includes B-mode imaging, spectral Doppler, color Doppler, and power Doppler as needed of all accessible portions of each vessel. Bilateral testing is considered an integral part of a complete examination. Limited examinations for reoccurring indications may be performed as noted. The reflux portion of the exam is performed with the patient in reverse Trendelenburg.  +---------+---------------+---------+-----------+----------+-------------------+ RIGHT    CompressibilityPhasicitySpontaneityPropertiesThrombus Aging      +---------+---------------+---------+-----------+----------+-------------------+ CFV      Full           Yes      No                                       +---------+---------------+---------+-----------+----------+-------------------+ SFJ      Full                                                             +---------+---------------+---------+-----------+----------+-------------------+ FV Prox  Full           Yes      No                                       +---------+---------------+---------+-----------+----------+-------------------+ FV Mid   Full                                                             +---------+---------------+---------+-----------+----------+-------------------+ FV DistalFull           Yes      No                                       +---------+---------------+---------+-----------+----------+-------------------+  PFV      Full           Yes      No                                       +---------+---------------+---------+-----------+----------+-------------------+ POP                     Yes      No                   patent by color and                                                       Doppler             +---------+---------------+---------+-----------+----------+-------------------+ PTV      Full                                                              +---------+---------------+---------+-----------+----------+-------------------+ PERO     Full                                                             +---------+---------------+---------+-----------+----------+-------------------+   +---------+---------------+---------+-----------+----------+-------------------+ LEFT     CompressibilityPhasicitySpontaneityPropertiesThrombus Aging      +---------+---------------+---------+-----------+----------+-------------------+ CFV      Full           Yes      No                                       +---------+---------------+---------+-----------+----------+-------------------+ SFJ      Full                                                             +---------+---------------+---------+-----------+----------+-------------------+ FV Prox  Full           Yes      Yes                                      +---------+---------------+---------+-----------+----------+-------------------+ FV Mid   Full                                                             +---------+---------------+---------+-----------+----------+-------------------+ FV Distal  Yes      Yes                  patent by color and                                                       Doppler             +---------+---------------+---------+-----------+----------+-------------------+ PFV      Full           Yes      No                                       +---------+---------------+---------+-----------+----------+-------------------+ POP                                                   patent by color and                                                       Doppler             +---------+---------------+---------+-----------+----------+-------------------+ PTV      Full                                                             +---------+---------------+---------+-----------+----------+-------------------+  PERO     Full                                                             +---------+---------------+---------+-----------+----------+-------------------+     Summary: RIGHT: - No evidence of deep vein thrombosis in the lower extremity. No indirect evidence of obstruction proximal to the inguinal ligament.  - No cystic structure found in the popliteal fossa.  LEFT: - No evidence of deep vein thrombosis in the lower extremity. No indirect evidence of obstruction proximal to the inguinal ligament.  - No cystic structure found in the popliteal fossa.  *See table(s) above for measurements and observations. Electronically signed by Debby Robertson on 11/04/2023 at 4:40:26 PM.    Final    VAS US  CAROTID Result Date: 11/04/2023 Carotid Arterial Duplex Study Patient Name:  Andrea Kirk  Date of Exam:   11/04/2023 Medical Rec #: [id number]      Accession #:    [id number] Date of Birth: May 27, 1940       Patient Gender: F Patient Age:   30 years Exam Location:  [facility] Procedure:      VAS US  CAROTID Referring Phys: DEVON SHAFER --------------------------------------------------------------------------------  Indications:       Right endarterectomy  03/22/16. Left endarterectomy 04/17/23. Risk Factors:      Hypertension, hyperlipidemia, Diabetes, no history of                    smoking. Comparison Study:  Prior carotid duplex done 01/31/23 indicating 1-39% right ICA                    stenosis and 40-59% left ICA stenosis Performing Technologist: Alberta Lis RVS  Examination Guidelines: A complete evaluation includes B-mode imaging, spectral Doppler, color Doppler, and power Doppler as needed of all accessible portions of each vessel. Bilateral testing is considered an integral part of a complete examination. Limited examinations for reoccurring indications may be performed as noted.  Right Carotid Findings: +----------+--------+--------+--------+------------------+---------+           PSV cm/sEDV  cm/sStenosisPlaque DescriptionComments  +----------+--------+--------+--------+------------------+---------+ CCA Prox  165     16                                          +----------+--------+--------+--------+------------------+---------+ CCA Distal84      13                                          +----------+--------+--------+--------+------------------+---------+ ICA Prox  62      8       1-39%   heterogenous      Shadowing +----------+--------+--------+--------+------------------+---------+ ICA Mid   75      15                                tortuous  +----------+--------+--------+--------+------------------+---------+ ICA Distal92      20                                tortuous  +----------+--------+--------+--------+------------------+---------+ ECA       317     15      >50%                                +----------+--------+--------+--------+------------------+---------+ +----------+--------+-------+--------+-------------------+           PSV cm/sEDV cmsDescribeArm Pressure (mmHG) +----------+--------+-------+--------+-------------------+ [id number]                                        +----------+--------+-------+--------+-------------------+ +---------+--------+--+--------+--+---------+ VertebralPSV cm/s48EDV cm/s12Antegrade +---------+--------+--+--------+--+---------+  Left Carotid Findings: +----------+--------+--------+--------+------------------+---------+           PSV cm/sEDV cm/sStenosisPlaque DescriptionComments  +----------+--------+--------+--------+------------------+---------+ CCA Prox  122     12              calcific                    +----------+--------+--------+--------+------------------+---------+ CCA Distal95      15              heterogenous                +----------+--------+--------+--------+------------------+---------+ ICA Prox  211     32      40-59%  heterogenous  Shadowing  +----------+--------+--------+--------+------------------+---------+ ICA Mid   147     32                                tortuous  +----------+--------+--------+--------+------------------+---------+ ICA Distal90      21                                          +----------+--------+--------+--------+------------------+---------+ ECA       161     15                                          +----------+--------+--------+--------+------------------+---------+ +----------+--------+--------+--------+-------------------+           PSV cm/sEDV cm/sDescribeArm Pressure (mmHG) +----------+--------+--------+--------+-------------------+ [id number]                                         +----------+--------+--------+--------+-------------------+ +---------+--------+--+--------+--+--------+ VertebralPSV cm/s32EDV cm/s10Atypical +---------+--------+--+--------+--+--------+   Summary: Right Carotid: Velocities in the right ICA are consistent with a 1-39% stenosis. Left Carotid: Velocities in the left ICA are consistent with a 40-59% stenosis.               Post stenotic waveform noted proximal ICA, unable to match               previous diastolic velocities. Vertebrals:  Right vertebral artery demonstrates antegrade flow. The left              vertebral artery exhibits an atypical waveform. Subclavians: Normal flow hemodynamics were seen in bilateral subclavian              arteries. *See table(s) above for measurements and observations.     Preliminary    CT ABDOMEN PELVIS WO CONTRAST Result Date: 11/04/2023 CLINICAL DATA:  Abdominal pain. EXAM: CT ABDOMEN AND PELVIS WITHOUT CONTRAST TECHNIQUE: Multidetector CT imaging of the abdomen and pelvis was performed following the standard protocol without IV contrast. RADIATION DOSE REDUCTION: This exam was performed according to the departmental dose-optimization program which includes automated exposure control, adjustment of the mA  and/or kV according to patient size and/or use of iterative reconstruction technique. COMPARISON:  11/03/2023 FINDINGS: Lower chest: Mild stable cardiomegaly. Median sternotomy wires are present. Evidence of previous CABG. Calcified plaque over the descending thoracic aorta. Bibasilar opacification right worse than left likely atelectasis. Hepatobiliary: Mild nodularity to the liver contour which can be seen with cirrhosis. No focal liver mass. Gallbladder and biliary tree are normal. Pancreas: Normal. Spleen: Normal. Adrenals/Urinary Tract: Adrenal glands are normal. Kidneys are normal in size without hydronephrosis or nephrolithiasis. Stable 2.9 cm simple left renal cyst over the lower pole. Ureters are unremarkable. Foley catheter is present within a decompressed bladder. Stomach/Bowel: Stomach and small bowel are normal. Appendix is unremarkable. Mild fecal retention throughout the colon which is otherwise unremarkable. Vascular/Lymphatic: Calcified plaque over the abdominal aorta which is normal caliber. Remaining vascular structures are unremarkable. No adenopathy. Reproductive: Uterus and bilateral adnexa are unremarkable. Other: No free fluid or focal inflammatory change. Musculoskeletal: Hardware over the right proximal femur fixating patient's right hip fracture. Stable subcutaneous hematomas over the soft tissues of the right hip with skin  staples present compatible patient's recent surgery. IMPRESSION: 1. No acute findings in the abdomen/pelvis. 2. Mild nodularity to the liver contour which can be seen with cirrhosis. 3. Stable 2.9 cm simple left renal cyst. No follow-up imaging is recommended. 4. Stable bibasilar opacification right worse than left likely atelectasis. 5. Aortic atherosclerosis. 6. Postsurgical changes with stable subcutaneous hematomas over the right hip. Aortic Atherosclerosis (ICD10-I70.0). Electronically Signed   By: Toribio Agreste M.D.   On: 11/04/2023 09:16   MR ANGIO HEAD WO  CONTRAST Result Date: 11/03/2023 EXAM: MR Angiography Head without intravenous Contrast. 11/03/2023 05:41:36 PM TECHNIQUE: Magnetic resonance angiography images of the head without intravenous contrast. Multiplanar 2D and 3D reformatted images are provided for review. COMPARISON: MRI head and CT head earlier same day and CTA head 04/16/2020. CLINICAL HISTORY: Stroke, follow up. FINDINGS: ANTERIOR CIRCULATION: The intracranial internal carotid arteries are patent bilaterally. There is mild atherosclerotic irregularity of the bilateral carotid siphons. The visualized portions of the anterior cerebral arteries are patent bilaterally. The M1 segments of the MCAs are patent bilaterally. There is slightly limited evaluation of bilateral MCA branches due to artifact; within these limitations, no focal occlusion is identified. No aneurysm. 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 The posterior cerebral arteries are patent proximally; slightly limited evaluation of distal branches due to artifact. The basilar artery is patent. The intracranial vertebral artery on the right is patent to the vertebrobasilar confluence. There is limited visualization of the proximal left V4 segment, likely related to atherosclerosis and high grade stenosis seen on the prior CTA. The superior cerebellar arteries are patent bilaterally. No aneurysm. IMPRESSION: 1. No acute findings. 2. 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 3. Limited visualization of the proximal left V4 segment, likely related to atherosclerosis and high-grade stenosis seen on prior CTA. 4. Mild atherosclerotic irregularity of the bilateral carotid siphons. Electronically signed by: Donnice Mania MD 11/03/2023 06:50 PM EDT RP Workstation: [id number]   MR BRAIN WO CONTRAST Result Date: 11/03/2023 EXAM: MRI BRAIN WITHOUT CONTRAST 11/03/2023 03:36:35 PM TECHNIQUE: Multiplanar multisequence MRI of the head/brain was  performed without the administration of intravenous contrast. COMPARISON: CT head earlier same day. CLINICAL HISTORY: Neuro deficit, acute, stroke suspected. Patient presents for altered mental status. Medical history includes CAD, HTN, HLD, GERD, DM, depression, anxiety, CVA, CKD. She is currently 12 days postop from repair of a right hip fracture. She had postoperative anemia and received 2 units PRBCs. She was discharged 7 days ago. Per EMS, nursing facility reported that she was in her normal state of health 2 days ago. She was reportedly walking on her recently repaired hip. She is alert, oriented and conversant at baseline. She had an acute change in her mental status yesterday. She underwent outpatient lab work yesterday which showed AKI. FINDINGS: BRAIN AND VENTRICLES: Multiple scattered areas of acute infarct are noted. The dominant region of infarct involves the left cerebellum within the left PICA territory. There are a few additional punctate scattered areas of acute infarct within the right cerebellum. An additional focus of infarct is in the left frontal lobe centered within the left middle frontal gyrus. There are scattered areas of acute infarct within the posterior frontal lobes and parietal lobes bilaterally. An additional infarct is in the posterior and lateral cortex of the right occipital lobe. Additional punctate areas of acute infarct are in the left occipital cortex. Edema within the left cerebellum in the region of infarct resulting in  mild local mass effect. No acute intracranial hemorrhage. No effacement of the fourth ventricle. No hydrocephalus. No midline shift. Mild chronic microvascular ischemic changes. Bilateral lens replacement. The sella is unremarkable. Normal flow voids. ORBITS: No acute abnormality. SINUSES AND MASTOIDS: No acute abnormality. BONES AND SOFT TISSUES: Normal marrow signal. No acute soft tissue abnormality. IMPRESSION: 1. 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 2. Additional acute infarcts in the right cerebellum, left frontal lobe, bilateral posterior frontal and parietal lobes, right occipital lobe, and left occipital cortex. 3. No effacement of the fourth ventricle, hydrocephalus, or midline shift. 4. Mild chronic microvascular ischemic changes. Electronically signed by: Donnice Mania MD 11/03/2023 05:35 PM EDT RP Workstation: [id number]   CT CHEST ABDOMEN PELVIS WO CONTRAST Result Date: 11/03/2023 CLINICAL DATA:  Altered mental status.  Sepsis suspected. EXAM: CT CHEST, ABDOMEN AND PELVIS WITHOUT CONTRAST TECHNIQUE: Multidetector CT imaging of the chest, abdomen and pelvis was performed following the standard protocol without IV contrast. RADIATION DOSE REDUCTION: This exam was performed according to the departmental dose-optimization program which includes automated exposure control, adjustment of the mA and/or kV according to patient size and/or use of iterative reconstruction technique. COMPARISON:  CT of the chest, abdomen and pelvis 10/21/2023. Abdominopelvic CT 05/28/2022. FINDINGS: CT CHEST FINDINGS Cardiovascular: Atherosclerosis of the aorta, great vessels and coronary arteries status post median sternotomy and CABG. The heart is mildly enlarged. No significant pericardial fluid. Mediastinum/Nodes: There are no enlarged mediastinal, hilar or axillary lymph nodes. Small hiatal hernia again noted. The thyroid  gland appears unremarkable. Lungs/Pleura: Dependent pleural thickening bilaterally without significant pleural fluid. No pneumothorax. Dependent airspace opacities at both lung bases, similar to recent prior CT, likely atelectasis. No confluent airspace disease. Musculoskeletal/Chest wall: No chest wall mass or suspicious osseous findings. Healed median sternotomy. Multilevel spondylosis associated with a mild convex right thoracic  scoliosis. CT ABDOMEN AND PELVIS FINDINGS Hepatobiliary: No acute or focal abnormalities are identified on noncontrast imaging. Mild contour irregularity of the liver suggested, potentially due to mild underlying cirrhosis. No evidence of gallstones, gallbladder wall thickening or biliary dilatation. Pancreas: Unremarkable. No pancreatic ductal dilatation or surrounding inflammatory changes. Spleen: Normal in size without focal abnormality. Adrenals/Urinary Tract: Both adrenal glands appear normal. The kidneys appears stable, without evidence of urinary tract calculus or hydronephrosis. No suspicious renal lesions are identified on noncontrast imaging. A cyst projecting posteriorly from the lower pole of the left kidney appears unchanged, measuring 3.1 cm on image 73/5. No specific follow-up imaging recommended. The bladder appears normal for its degree of distention. Stomach/Bowel: No enteric contrast administered. The stomach appears unremarkable for its degree of distension. No evidence of bowel wall thickening, distention or surrounding inflammatory change. Mild colonic diverticulosis. Vascular/Lymphatic: There are no enlarged abdominal or pelvic lymph nodes. Aortic and branch vessel atherosclerosis without evidence of aneurysm. Reproductive: The uterus and ovaries appear unremarkable. No adnexal mass. Other: No evidence of abdominal wall mass or hernia. No ascites or pneumoperitoneum. Musculoskeletal: Interval proximal right femoral ORIF with improved alignment of the previously demonstrated intertrochanteric fracture. There are small hematomas within the lateral subcutaneous fat, measuring up to 4.3 cm in diameter. No evidence of pelvic hematoma. No new fractures are identified. There are degenerative and postsurgical changes in the lower lumbar spine. IMPRESSION: 1. No acute findings or clear explanation for the patient's symptoms. 2. Interval proximal right femoral ORIF with improved alignment of the  previously demonstrated intertrochanteric  fracture. There are small hematomas within the lateral subcutaneous fat. 3. Dependent airspace opacities at both lung bases, similar to recent prior CT, likely atelectasis. 4. Mild contour irregularity of the liver, potentially due to mild underlying cirrhosis. 5.  Aortic Atherosclerosis (ICD10-I70.0). Electronically Signed   By: Elsie Perone M.D.   On: 11/03/2023 12:07   CT Head Wo Contrast Result Date: 11/03/2023 EXAM: CT HEAD WITHOUT CONTRAST 11/03/2023 11:29:35 AM TECHNIQUE: CT of the head was performed without the administration of intravenous contrast. Automated exposure control, iterative reconstruction, and/or weight based adjustment of the mA/kV was utilized to reduce the radiation dose to as low as reasonably achievable. COMPARISON: Brain MRI 08/28/2022. Head CT 10/21/2023. CLINICAL HISTORY: 83 year old female. Mental status change, unknown cause. Noted confusion, saying words that don't make sense. Thought to be hyperkalemic. FINDINGS: BRAIN AND VENTRICLES: No acute hemorrhage. Brain volume remains normal for age. Multiple acute to subacute appearing infarcts are present in both cerebral hemispheres and the left cerebellum (largest). Specifically, there is a small new 2 cm area of cytotoxic edema in the anterior left middle frontal gyrus, anterior left MCA territory (series 3 image 12), with no regional mass effect. A larger area of confluent cytotoxic edema is seen in the left cerebellum, left PICA or AICA territory (series 3 image 25). Additionally, there is a small cortical infarct in the right lateral occipital lobe, approximately 1 cm on series 3 image 18. These findings are new from the recent head CT of 10/21/2023. No hemorrhagic transformation. No significant intracranial mass effect. No ventriculomegaly. No hydrocephalus. No extra-axial collection. No midline shift. Calcified atherosclerosis at the skull base. No suspicious intracranial vascular  hyperdensity. The pattern of these findings suggests sequelae of a recent embolic event. ORBITS: No acute abnormality. SINUSES: No acute abnormality. SOFT TISSUES AND SKULL: No acute soft tissue abnormality. No skull fracture. IMPRESSION: 1. Multiple acute to subacute appearing infarcts in both cerebral hemispheres and the left cerebellum (largest). Pattern suggests a recent embolic event. 2. No hemorrhagic transformation or intracranial mass effect. Electronically signed by: Helayne Hurst MD 11/03/2023 11:39 AM EDT RP Workstation: [id number]   DG Chest Port 1 View Result Date: 11/03/2023 EXAM: 1 VIEW(S) XRAY OF THE CHEST 11/03/2023 10:37:00 AM COMPARISON: None available. CLINICAL HISTORY: Questionable sepsis - evaluate for abnormality. FINDINGS: LUNGS AND PLEURA: Low lung volumes. Left basilar airspace opacities. Small left pleural effusion. HEART AND MEDIASTINUM: Stable Cardiomegaly. Sternotomy wires and mediastinal surgical clips suggesting CABG. BONES AND SOFT TISSUES: No acute osseous abnormality. IMPRESSION: 1. Left basilar airspace opacities with a small left pleural effusion. Electronically signed by: Oneil Devonshire MD 11/03/2023 11:11 AM EDT RP Workstation: [id number], MIN 2 VIEWS RIGHT Result Date: 10/22/2023 CLINICAL DATA:  Intraoperative fluoroscopic images of proximal right femoral fracture. EXAM: DG FEMUR 2+V*R* COMPARISON:  None Available. FINDINGS: Multiple intraoperative fluoroscopic spot images are provided. Interval open reduction and proximal femoral nail fixation of a comminuted right intertrochanteric femur fracture with improved alignment. Total fluoroscopy time: 1 minute and 23 seconds Total dose: Radiation Exposure Index (as provided by the fluoroscopic device): 29.16 mGy air Kerma Please see intraoperative findings for further detail. IMPRESSION: Intraoperative fluoroscopic spot images of proximal femoral nail fixation of a comminuted right intertrochanteric femur fracture  with improved alignment. Electronically Signed   By: Harrietta Sherry M.D.   On: 10/22/2023 18:26   DG C-Arm 1-60 Min-No Report Result Date: 10/22/2023 Fluoroscopy was utilized by the requesting physician.  No radiographic interpretation.   ECHOCARDIOGRAM COMPLETE  Result Date: 10/22/2023    ECHOCARDIOGRAM REPORT   Patient Name:   Aayliah LITTIE Friedli Date of Exam: 10/22/2023 Medical Rec #:  [id number]     Height:       62.0 in Accession #:    [id number]    Weight:       185.4 lb Date of Birth:  [birth year]      BSA:          1.851 m Patient Age:    83 years      BP:           131/51 mmHg Patient Gender: F             HR:           82 bpm. Exam Location:  Inpatient Procedure: 2D Echo, Color Doppler and Cardiac Doppler (Both Spectral and Color            Flow Doppler were utilized during procedure). Indications:    Pre Op Z01.810  History:        Patient has prior history of Echocardiogram examinations, most                 recent 02/15/2021. CAD, Stroke, Signs/Symptoms:Syncope; Risk                 Factors:Diabetes and Hypertension. H/O Hyperlipidemia.  Sonographer:    BERNARDA ROCKS Referring Phys: DOMINGO.DIONES ANASTASSIA DOUTOVA IMPRESSIONS  1. Left ventricular ejection fraction, by estimation, is >75%. The left ventricle has hyperdynamic function. The left ventricle has no regional wall motion abnormalities. Left ventricular diastolic parameters are consistent with Grade II diastolic dysfunction (pseudonormalization).  2. Right ventricular systolic function is normal. The right ventricular size is normal. There is severely elevated pulmonary artery systolic pressure.  3. Left atrial size was moderately dilated.  4. Right atrial size was mildly dilated.  5. The mitral valve is normal in structure. Trivial mitral valve regurgitation. No evidence of mitral stenosis.  6. The aortic valve is tricuspid. Aortic valve regurgitation is not visualized. Mild aortic valve stenosis.  7. Aortic dilatation noted. There is borderline  dilatation of the ascending aorta, measuring 39 mm.  8. The inferior vena cava is normal in size with greater than 50% respiratory variability, suggesting right atrial pressure of 3 mmHg. FINDINGS  Left Ventricle: Left ventricular ejection fraction, by estimation, is >75%. The left ventricle has hyperdynamic function. The left ventricle has no regional wall motion abnormalities. The left ventricular internal cavity size was normal in size. There is no left ventricular hypertrophy. Left ventricular diastolic parameters are consistent with Grade II diastolic dysfunction (pseudonormalization). Right Ventricle: The right ventricular size is normal. Right ventricular systolic function is normal. There is severely elevated pulmonary artery systolic pressure. The tricuspid regurgitant velocity is 4.36 m/s, and with an assumed right atrial pressure  of 3 mmHg, the estimated right ventricular systolic pressure is 79.0 mmHg. Left Atrium: Left atrial size was moderately dilated. Right Atrium: Right atrial size was mildly dilated. Pericardium: There is no evidence of pericardial effusion. Mitral Valve: The mitral valve is normal in structure. Mild mitral annular calcification. Trivial mitral valve regurgitation. No evidence of mitral valve stenosis. MV peak gradient, 4.8 mmHg. The mean mitral valve gradient is 2.0 mmHg. Tricuspid Valve: The tricuspid valve is normal in structure. Tricuspid valve regurgitation is mild . No evidence of tricuspid stenosis. Aortic Valve: The aortic valve is tricuspid. Aortic valve regurgitation is not visualized. Mild aortic stenosis is present. Aortic valve mean  gradient measures 9.5 mmHg. Aortic valve peak gradient measures 20.3 mmHg. Aortic valve area, by VTI measures 1.68 cm. Pulmonic Valve: The pulmonic valve was normal in structure. Pulmonic valve regurgitation is not visualized. No evidence of pulmonic stenosis. Aorta: Aortic dilatation noted. There is borderline dilatation of the ascending  aorta, measuring 39 mm. Venous: The inferior vena cava is normal in size with greater than 50% respiratory variability, suggesting right atrial pressure of 3 mmHg. IAS/Shunts: No atrial level shunt detected by color flow Doppler.  LEFT VENTRICLE PLAX 2D LVIDd:         4.80 cm      Diastology LVIDs:         3.00 cm      LV e' medial:    11.20 cm/s LV PW:         0.80 cm      LV E/e' medial:  10.5 LV IVS:        0.90 cm      LV e' lateral:   14.90 cm/s LVOT diam:     1.60 cm      LV E/e' lateral: 7.9 LV SV:         73 LV SV Index:   40 LVOT Area:     2.01 cm  LV Volumes (MOD) LV vol d, MOD A2C: 125.0 ml LV vol d, MOD A4C: 121.0 ml LV vol s, MOD A2C: 36.7 ml LV vol s, MOD A4C: 34.2 ml LV SV MOD A2C:     88.3 ml LV SV MOD A4C:     121.0 ml LV SV MOD BP:      88.5 ml RIGHT VENTRICLE             IVC RV Basal diam:  4.40 cm     IVC diam: 1.40 cm RV S prime:     11.50 cm/s TAPSE (M-mode): 1.6 cm RVSP:           79.0 mmHg LEFT ATRIUM             Index        RIGHT ATRIUM           Index LA diam:        4.40 cm 2.38 cm/m   RA Pressure: 3.00 mmHg LA Vol (A2C):   96.3 ml 52.02 ml/m  RA Area:     18.90 cm LA Vol (A4C):   59.8 ml 32.30 ml/m  RA Volume:   60.60 ml  32.74 ml/m LA Biplane Vol: 76.7 ml 41.43 ml/m  AORTIC VALVE                     PULMONIC VALVE AV Area (Vmax):    1.53 cm      PV Vmax:          1.38 m/s AV Area (Vmean):   1.55 cm      PV Peak grad:     7.6 mmHg AV Area (VTI):     1.68 cm      PR End Diast Vel: 3.96 msec AV Vmax:           225.50 cm/s AV Vmean:          141.000 cm/s AV VTI:            0.436 m AV Peak Grad:      20.3 mmHg AV Mean Grad:      9.5 mmHg LVOT Vmax:  172.00 cm/s LVOT Vmean:        109.000 cm/s LVOT VTI:          0.365 m LVOT/AV VTI ratio: 0.84  AORTA Ao Root diam: 3.00 cm Ao Asc diam:  3.90 cm MITRAL VALVE                TRICUSPID VALVE MV Area (PHT): 3.93 cm     TR Peak grad:   76.0 mmHg MV Area VTI:   2.64 cm     TR Mean grad:   41.0 mmHg MV Peak grad:  4.8 mmHg     TR  Vmax:        436.00 cm/s MV Mean grad:  2.0 mmHg     TR Vmean:       300.0 cm/s MV Vmax:       1.09 m/s     Estimated RAP:  3.00 mmHg MV Vmean:      68.7 cm/s    RVSP:           79.0 mmHg MV Decel Time: 193 msec MV E velocity: 118.00 cm/s  SHUNTS MV A velocity: 83.20 cm/s   Systemic VTI:  0.36 m MV E/A ratio:  1.42         Systemic Diam: 1.60 cm Redell Shallow MD Electronically signed by Redell Shallow MD Signature Date/Time: 10/22/2023/1:22:44 PM    Final    CT CHEST ABDOMEN PELVIS WO CONTRAST Result Date: 10/21/2023 EXAM: CT CHEST, ABDOMEN AND PELVIS WITHOUT CONTRAST 10/21/2023 08:40:45 PM TECHNIQUE: CT of the chest, abdomen and pelvis was performed without the administration of intravenous contrast. Multiplanar reformatted images are provided for review. Automated exposure control, iterative reconstruction, and/or weight based adjustment of the mA/kV was utilized to reduce the radiation dose to as low as reasonably achievable. COMPARISON: CT abdomen and pelvis 05/28/2022 and CT angiogram chest 12/21/2017. CLINICAL HISTORY: Polytrauma, blunt. FINDINGS: CHEST: MEDIASTINUM AND LYMPH NODES: Heart is mildly enlarged. There are atherosclerotic calcifications of the aorta and coronary arteries. Patient is status post cardiac surgery. Sternotomy wires are present. The central airways are clear. No mediastinal, hilar or axillary lymphadenopathy. There is a moderate-sized hiatal hernia and patulous esophagus. LUNGS AND PLEURA: There is a small amount of right lower lobe airspace disease. No pleural effusion or pneumothorax. ABDOMEN AND PELVIS: LIVER: The liver is unremarkable. GALLBLADDER AND BILE DUCTS: Gallbladder is unremarkable. No biliary ductal dilatation. SPLEEN: No acute abnormality. PANCREAS: No acute abnormality. ADRENAL GLANDS: No acute abnormality. KIDNEYS, URETERS AND BLADDER: There is a 3 cm cyst in the inferior pole of the left kidney, unchanged. Per consensus, no follow-up is needed for simple Bosniak  type 1 and 2 renal cysts, unless the patient has a malignancy history or risk factors. No stones in the kidneys or ureters. No hydronephrosis. No perinephric or periureteral stranding. Urinary bladder is unremarkable. GI AND BOWEL: Stomach demonstrates no acute abnormality. Appendix is not visualized. There is no bowel obstruction. REPRODUCTIVE ORGANS: No acute abnormality. PERITONEUM AND RETROPERITONEUM: No ascites. No free air. There is a small fat-containing umbilical hernia. VASCULATURE: Aorta is normal in caliber. There are atherosclerotic calcifications of the aorta and iliac arteries. ABDOMINAL AND PELVIS LYMPH NODES: No lymphadenopathy. BONES AND SOFT TISSUES: There are degenerative changes throughout the spine. L4-L5 lumbar fusion hardware present. There is a comminuted right femoral intertrochanteric fracture with superolateral displacement of the distal fracture fragment. No focal soft tissue abnormality. IMPRESSION: 1. Comminuted right intertrochanteric femur fracture with superolateral displacement of the distal  fragment. 2. Small right lower lobe airspace disease. No pleural effusion or pneumothorax. 3. Moderate hiatal hernia. Electronically signed by: Greig Pique MD 10/21/2023 09:00 PM EDT RP Workstation: [id number]   CT HEAD WO CONTRAST Result Date: 10/21/2023 EXAM: CT HEAD AND CERVICAL SPINE 10/21/2023 08:40:45 PM TECHNIQUE: CT of the head and cervical spine was performed without the administration of intravenous contrast. Multiplanar reformatted images are provided for review. Automated exposure control, iterative reconstruction, and/or weight based adjustment of the mA/kV was utilized to reduce the radiation dose to as low as reasonably achievable. COMPARISON: MRI brain dated 08/28/2022. CLINICAL HISTORY: Head trauma, moderate-severe. FINDINGS: CT HEAD BRAIN AND VENTRICLES: No acute intracranial hemorrhage. No mass effect or midline shift. No abnormal extra-axial fluid collection. No evidence  of acute infarct. No hydrocephalus. Subcortical and periventricular small vessel ischemic changes. ORBITS: No acute abnormality. SINUSES AND MASTOIDS: No acute abnormality. SOFT TISSUES AND SKULL: No acute skull fracture. No acute soft tissue abnormality. CT CERVICAL SPINE BONES AND ALIGNMENT: No acute fracture or traumatic malalignment. 4 mm anterolisthesis of C7 on T1. DEGENERATIVE CHANGES: Mild degenerative changes of the mid to lower cervical spine. SOFT TISSUES: No prevertebral soft tissue swelling. IMPRESSION: 1. No acute intracranial abnormality. 2. No acute traumatic injury to the cervical spine. Electronically signed by: Pinkie Pebbles MD 10/21/2023 08:58 PM EDT RP Workstation: [id number]   CT CERVICAL SPINE WO CONTRAST Result Date: 10/21/2023 EXAM: CT HEAD AND CERVICAL SPINE 10/21/2023 08:40:45 PM TECHNIQUE: CT of the head and cervical spine was performed without the administration of intravenous contrast. Multiplanar reformatted images are provided for review. Automated exposure control, iterative reconstruction, and/or weight based adjustment of the mA/kV was utilized to reduce the radiation dose to as low as reasonably achievable. COMPARISON: MRI brain dated 08/28/2022. CLINICAL HISTORY: Head trauma, moderate-severe. FINDINGS: CT HEAD BRAIN AND VENTRICLES: No acute intracranial hemorrhage. No mass effect or midline shift. No abnormal extra-axial fluid collection. No evidence of acute infarct. No hydrocephalus. Subcortical and periventricular small vessel ischemic changes. ORBITS: No acute abnormality. SINUSES AND MASTOIDS: No acute abnormality. SOFT TISSUES AND SKULL: No acute skull fracture. No acute soft tissue abnormality. CT CERVICAL SPINE BONES AND ALIGNMENT: No acute fracture or traumatic malalignment. 4 mm anterolisthesis of C7 on T1. DEGENERATIVE CHANGES: Mild degenerative changes of the mid to lower cervical spine. SOFT TISSUES: No prevertebral soft tissue swelling. IMPRESSION: 1. No  acute intracranial abnormality. 2. No acute traumatic injury to the cervical spine. Electronically signed by: Pinkie Pebbles MD 10/21/2023 08:58 PM EDT RP Workstation: [id number]   DG Pelvis Portable Result Date: 10/21/2023 EXAM: 1 or 2 VIEW(S) XRAY OF THE PELVIS 10/21/2023 08:18:00 PM COMPARISON: None available. CLINICAL HISTORY: Fall. FINDINGS: BONES AND JOINTS: Acute comminuted and displaced right intertrochanteric fracture. No right hip dislocation. No acute displaced fracture or dislocation of the left hip. No acute displaced fracture or diastasis of the bones of the pelvis. Degenerative changes of the visualized lower lumbar spine. L5-S1 surgical hardware noted. Sacrum is grossly unremarkable - limited evaluation due to overlapping osseous structures and overlying soft tissues. SOFT TISSUES: The soft tissues are unremarkable. IMPRESSION: 1. Acute comminuted and displaced right intertrochanteric fracture. Electronically signed by: Morgane Naveau MD 10/21/2023 08:31 PM EDT RP Workstation: [id number]   DG Chest Port 1 View Result Date: 10/21/2023 EXAM: 1 VIEW XRAY OF THE CHEST 10/21/2023 08:18:00 PM COMPARISON: CT chest dated 12/21/2017. CLINICAL HISTORY: Trauma. fall. FINDINGS: LUNGS AND PLEURA: Chronic coarsened interstitial markings with no overt pulmonary edema. No focal consolidation. No pleural  effusion. No pneumothorax. HEART AND MEDIASTINUM: Unchanged cardiomediastinal silhouette. Atherosclerotic plaque. Surgical changes overlies the mediastinum. BONES AND SOFT TISSUES: Intact sternotomy wires. No acute osseous abnormality. IMPRESSION: 1. No acute cardiopulmonary process identified. Electronically signed by: Kate Plummer MD 10/21/2023 08:29 PM EDT RP Workstation: [id number]      Subjective:  No significant events overnight, she denies any complaints this morning Discharge Exam: Vitals:   11/07/23 0759 11/07/23 1205  BP: (!) 165/71 (!) 169/58  Pulse: (!) 59 67  Resp: 16 15  Temp:  (!) 97.4 F (36.3 C) 98.9 F (37.2 C)  SpO2: 98% 97%   Vitals:   11/07/23 0051 11/07/23 0500 11/07/23 0759 11/07/23 1205  BP: 136/80 (!) 158/80 (!) 165/71 (!) 169/58  Pulse:   (!) 59 67  Resp:   16 15  Temp: 99.7 F (37.6 C) 98.8 F (37.1 C) (!) 97.4 F (36.3 C) 98.9 F (37.2 C)  TempSrc: Axillary Axillary Axillary Oral  SpO2:   98% 97%  Weight:  77 kg    Height:        General: Pt is alert, awake, not in acute distress Cardiovascular: RRR, S1/S2 +, no rubs, no gallops Respiratory: CTA bilaterally, no wheezing, no rhonchi Abdominal: Soft, NT, ND, bowel sounds + Extremities: no edema, no cyanosis    The results of significant diagnostics from this hospitalization (including imaging, microbiology, ancillary and laboratory) are listed below for reference.     Microbiology: Recent Results (from the past 240 hours)  Blood Culture (routine x 2)     Status: None (Preliminary result)   Collection Time: 11/03/23 10:26 AM   Specimen: BLOOD RIGHT ARM  Result Value Ref Range Status   Specimen Description BLOOD RIGHT ARM  Final   Special Requests   Final    BOTTLES DRAWN AEROBIC AND ANAEROBIC Blood Culture results may not be optimal due to an inadequate volume of blood received in culture bottles   Culture   Final    NO GROWTH 4 DAYS Performed at [facility], [address], [place], [place] [address]    Report Status PENDING  Incomplete  Blood Culture (routine x 2)     Status: None (Preliminary result)   Collection Time: 11/03/23 10:31 AM   Specimen: BLOOD LEFT ARM  Result Value Ref Range Status   Specimen Description BLOOD LEFT ARM  Final   Special Requests   Final    BOTTLES DRAWN AEROBIC AND ANAEROBIC Blood Culture results may not be optimal due to an inadequate volume of blood received in culture bottles   Culture   Final    NO GROWTH 4 DAYS Performed at [facility], [address] [place], [place] [address]    Report Status PENDING  Incomplete  Resp  panel by RT-PCR (RSV, Flu A&B, Covid) Anterior Nasal Swab     Status: None   Collection Time: 11/03/23 10:46 AM   Specimen: Anterior Nasal Swab  Result Value Ref Range Status   SARS Coronavirus 2 by RT PCR NEGATIVE NEGATIVE Final   Influenza A by PCR NEGATIVE NEGATIVE Final   Influenza B by PCR NEGATIVE NEGATIVE Final    Comment: (NOTE) The Xpert Xpress SARS-CoV-2/FLU/RSV plus assay is intended as an aid in the diagnosis of influenza from Nasopharyngeal swab specimens and should not be used as a sole basis for treatment. Nasal washings and aspirates are unacceptable for Xpert Xpress SARS-CoV-2/FLU/RSV testing.  Fact Sheet for Patients: bloggercourse.com  Fact Sheet for Healthcare Providers:  seriousbroker.it  This test is not yet approved or cleared by the [place]  FDA and has been authorized for detection and/or diagnosis of SARS-CoV-2 by FDA under an Emergency Use Authorization (EUA). This EUA will remain in effect (meaning this test can be used) for the duration of the COVID-19 declaration under Section 564(b)(1) of the Act, 21 U.S.C. section 360bbb-3(b)(1), unless the authorization is terminated or revoked.     Resp Syncytial Virus by PCR NEGATIVE NEGATIVE Final    Comment: (NOTE) Fact Sheet for Patients: bloggercourse.com  Fact Sheet for Healthcare Providers: seriousbroker.it  This test is not yet approved or cleared by the [place]  FDA and has been authorized for detection and/or diagnosis of SARS-CoV-2 by FDA under an Emergency Use Authorization (EUA). This EUA will remain in effect (meaning this test can be used) for the duration of the COVID-19 declaration under Section 564(b)(1) of the Act, 21 U.S.C. section 360bbb-3(b)(1), unless the authorization is terminated or revoked.  Performed at [facility], [address] [place], [place] [address]    Urine Culture     Status: Abnormal   Collection Time: 11/03/23 12:00 PM   Specimen: Urine, Random  Result Value Ref Range Status   Specimen Description URINE, RANDOM  Final   Special Requests   Final    NONE Reflexed from [id number] Performed at [facility], [address], [place], [place] [address]    Culture (A)  Final    >=100,000 COLONIES/mL ESCHERICHIA COLI Confirmed Extended Spectrum Beta-Lactamase Producer (ESBL).  In bloodstream infections from ESBL organisms, carbapenems are preferred over piperacillin/tazobactam. They are shown to have a lower risk of mortality. 40,000 COLONIES/mL PROTEUS MIRABILIS    Report Status 11/06/2023 FINAL  Final   Organism ID, Bacteria ESCHERICHIA COLI (A)  Final   Organism ID, Bacteria PROTEUS MIRABILIS (A)  Final      Susceptibility   Escherichia coli - MIC*    AMPICILLIN >=32 RESISTANT Resistant     CEFAZOLIN  (URINE) Value in next row Resistant      >=32 RESISTANTThis is a modified FDA-approved test that has been validated and its performance characteristics determined by the reporting laboratory.  This laboratory is certified under the Clinical Laboratory Improvement Amendments CLIA as qualified to perform high complexity clinical laboratory testing.    CEFEPIME  Value in next row Resistant      >=32 RESISTANTThis is a modified FDA-approved test that has been validated and its performance characteristics determined by the reporting laboratory.  This laboratory is certified under the Clinical Laboratory Improvement Amendments CLIA as qualified to perform high complexity clinical laboratory testing.    ERTAPENEM Value in next row Sensitive      >=32 RESISTANTThis is a modified FDA-approved test that has been validated and its performance characteristics determined by the reporting laboratory.  This laboratory is certified under the Clinical Laboratory Improvement Amendments CLIA as qualified to perform high complexity clinical laboratory testing.     CEFTRIAXONE  Value in next row Resistant      >=32 RESISTANTThis is a modified FDA-approved test that has been validated and its performance characteristics determined by the reporting laboratory.  This laboratory is certified under the Clinical Laboratory Improvement Amendments CLIA as qualified to perform high complexity clinical laboratory testing.    CIPROFLOXACIN Value in next row Resistant      >=32 RESISTANTThis is a modified FDA-approved test that has been validated and its performance characteristics determined by the reporting laboratory.  This laboratory is certified  under the Clinical Laboratory Improvement Amendments CLIA as qualified to perform high complexity clinical laboratory testing.    GENTAMICIN Value in next row Sensitive      >=32 RESISTANTThis is a modified FDA-approved test that has been validated and its performance characteristics determined by the reporting laboratory.  This laboratory is certified under the Clinical Laboratory Improvement Amendments CLIA as qualified to perform high complexity clinical laboratory testing.    NITROFURANTOIN Value in next row Sensitive      >=32 RESISTANTThis is a modified FDA-approved test that has been validated and its performance characteristics determined by the reporting laboratory.  This laboratory is certified under the Clinical Laboratory Improvement Amendments CLIA as qualified to perform high complexity clinical laboratory testing.    TRIMETH /SULFA  Value in next row Resistant      >=32 RESISTANTThis is a modified FDA-approved test that has been validated and its performance characteristics determined by the reporting laboratory.  This laboratory is certified under the Clinical Laboratory Improvement Amendments CLIA as qualified to perform high complexity clinical laboratory testing.    AMPICILLIN/SULBACTAM Value in next row Sensitive      >=32 RESISTANTThis is a modified FDA-approved test that has been validated and its performance  characteristics determined by the reporting laboratory.  This laboratory is certified under the Clinical Laboratory Improvement Amendments CLIA as qualified to perform high complexity clinical laboratory testing.    PIP/TAZO Value in next row Sensitive      <=4 SENSITIVEThis is a modified FDA-approved test that has been validated and its performance characteristics determined by the reporting laboratory.  This laboratory is certified under the Clinical Laboratory Improvement Amendments CLIA as qualified to perform high complexity clinical laboratory testing.    MEROPENEM Value in next row Sensitive      <=4 SENSITIVEThis is a modified FDA-approved test that has been validated and its performance characteristics determined by the reporting laboratory.  This laboratory is certified under the Clinical Laboratory Improvement Amendments CLIA as qualified to perform high complexity clinical laboratory testing.    * >=100,000 COLONIES/mL ESCHERICHIA COLI   Proteus mirabilis - MIC*    AMPICILLIN Value in next row Sensitive      <=4 SENSITIVEThis is a modified FDA-approved test that has been validated and its performance characteristics determined by the reporting laboratory.  This laboratory is certified under the Clinical Laboratory Improvement Amendments CLIA as qualified to perform high complexity clinical laboratory testing.    CEFAZOLIN  (URINE) Value in next row Sensitive      4 SENSITIVEThis is a modified FDA-approved test that has been validated and its performance characteristics determined by the reporting laboratory.  This laboratory is certified under the Clinical Laboratory Improvement Amendments CLIA as qualified to perform high complexity clinical laboratory testing.    CEFEPIME  Value in next row Sensitive      4 SENSITIVEThis is a modified FDA-approved test that has been validated and its performance characteristics determined by the reporting laboratory.  This laboratory is certified under the  Clinical Laboratory Improvement Amendments CLIA as qualified to perform high complexity clinical laboratory testing.    ERTAPENEM Value in next row Sensitive      4 SENSITIVEThis is a modified FDA-approved test that has been validated and its performance characteristics determined by the reporting laboratory.  This laboratory is certified under the Clinical Laboratory Improvement Amendments CLIA as qualified to perform high complexity clinical laboratory testing.    CEFTRIAXONE  Value in next row Sensitive      4 SENSITIVEThis is  a modified FDA-approved test that has been validated and its performance characteristics determined by the reporting laboratory.  This laboratory is certified under the Clinical Laboratory Improvement Amendments CLIA as qualified to perform high complexity clinical laboratory testing.    CIPROFLOXACIN Value in next row Sensitive      4 SENSITIVEThis is a modified FDA-approved test that has been validated and its performance characteristics determined by the reporting laboratory.  This laboratory is certified under the Clinical Laboratory Improvement Amendments CLIA as qualified to perform high complexity clinical laboratory testing.    GENTAMICIN Value in next row Sensitive      4 SENSITIVEThis is a modified FDA-approved test that has been validated and its performance characteristics determined by the reporting laboratory.  This laboratory is certified under the Clinical Laboratory Improvement Amendments CLIA as qualified to perform high complexity clinical laboratory testing.    NITROFURANTOIN Value in next row Resistant      4 SENSITIVEThis is a modified FDA-approved test that has been validated and its performance characteristics determined by the reporting laboratory.  This laboratory is certified under the Clinical Laboratory Improvement Amendments CLIA as qualified to perform high complexity clinical laboratory testing.    TRIMETH /SULFA  Value in next row Sensitive      4  SENSITIVEThis is a modified FDA-approved test that has been validated and its performance characteristics determined by the reporting laboratory.  This laboratory is certified under the Clinical Laboratory Improvement Amendments CLIA as qualified to perform high complexity clinical laboratory testing.    AMPICILLIN/SULBACTAM Value in next row Sensitive      4 SENSITIVEThis is a modified FDA-approved test that has been validated and its performance characteristics determined by the reporting laboratory.  This laboratory is certified under the Clinical Laboratory Improvement Amendments CLIA as qualified to perform high complexity clinical laboratory testing.    PIP/TAZO Value in next row Sensitive      <=4 SENSITIVEThis is a modified FDA-approved test that has been validated and its performance characteristics determined by the reporting laboratory.  This laboratory is certified under the Clinical Laboratory Improvement Amendments CLIA as qualified to perform high complexity clinical laboratory testing.    MEROPENEM Value in next row Sensitive      <=4 SENSITIVEThis is a modified FDA-approved test that has been validated and its performance characteristics determined by the reporting laboratory.  This laboratory is certified under the Clinical Laboratory Improvement Amendments CLIA as qualified to perform high complexity clinical laboratory testing.    * 40,000 COLONIES/mL PROTEUS MIRABILIS     Labs: BNP (last 3 results) Recent Labs    11/03/23 1116  BNP 673.1*   Basic Metabolic Panel: Recent Labs  Lab 11/03/23 1115 11/03/23 1618 11/04/23 0405 11/05/23 0404 11/06/23 0218 11/07/23 0320  NA 141  --  144 141 140 139  K 4.4  --  4.3 4.2 4.3 4.8  CL 103  --  108 107 105 103  CO2 21*  --  24 24 24 22   GLUCOSE 143*  --  74 83 102* 129*  BUN 74*  --  58* 33* 19 12  CREATININE 2.37*  --  1.52* 0.99 0.79 0.75  CALCIUM  8.9  --  8.9 8.9 9.2 9.3  MG  --  3.0*  --  2.4 2.1 2.2   Liver Function  Tests: Recent Labs  Lab 11/03/23 1115 11/04/23 0405 11/05/23 0404 11/06/23 0218 11/07/23 0320  AST 1,001* 571* 228* 104* 62*  ALT 437* 353* 223* 145* 101*  ALKPHOS  98 88 76 80 82  BILITOT 1.6* 1.2 0.7 0.9 1.4*  PROT 7.2 6.3* 6.1* 5.9* 5.8*  ALBUMIN 2.9* 2.4* 2.3* 2.3* 2.4*   No results for input(s): LIPASE, AMYLASE in the last 168 hours. No results for input(s): AMMONIA in the last 168 hours. CBC: Recent Labs  Lab 11/03/23 1115 11/04/23 0405 11/05/23 0404 11/06/23 0218 11/07/23 0320  WBC 9.1 8.0 7.2 5.8 5.5  NEUTROABS 7.2  --  4.8 3.4 3.1  HGB 9.1* 8.1* 8.4* 8.3* 9.4*  HCT 29.1* 26.4* 26.9* 26.5* 30.9*  MCV 109.4* 109.5* 109.8* 107.3* 110.8*  PLT 282 250 229 204 185   Cardiac Enzymes: No results for input(s): CKTOTAL, CKMB, CKMBINDEX, TROPONINI in the last 168 hours. BNP: Invalid input(s): POCBNP CBG: Recent Labs  Lab 11/06/23 1314 11/06/23 1808 11/06/23 2115 11/07/23 0758 11/07/23 1204  GLUCAP 172* 153* 140* 137* 152*   D-Dimer No results for input(s): DDIMER in the last 72 hours. Hgb A1c No results for input(s): HGBA1C in the last 72 hours. Lipid Profile No results for input(s): CHOL, HDL, LDLCALC, TRIG, CHOLHDL, LDLDIRECT in the last 72 hours. Thyroid  function studies No results for input(s): TSH, T4TOTAL, T3FREE, THYROIDAB in the last 72 hours.  Invalid input(s): FREET3 Anemia work up No results for input(s): VITAMINB12, FOLATE, FERRITIN, TIBC, IRON, RETICCTPCT in the last 72 hours. Urinalysis    Component Value Date/Time   COLORURINE AMBER (A) 11/03/2023 1200   APPEARANCEUR CLOUDY (A) 11/03/2023 1200   LABSPEC 1.016 11/03/2023 1200   PHURINE 5.0 11/03/2023 1200   GLUCOSEU >=500 (A) 11/03/2023 1200   HGBUR LARGE (A) 11/03/2023 1200   BILIRUBINUR NEGATIVE 11/03/2023 1200   KETONESUR 5 (A) 11/03/2023 1200   PROTEINUR 100 (A) 11/03/2023 1200   UROBILINOGEN 1.0 06/17/2014 2145   NITRITE  NEGATIVE 11/03/2023 1200   LEUKOCYTESUR LARGE (A) 11/03/2023 1200   Sepsis Labs Recent Labs  Lab 11/04/23 0405 11/05/23 0404 11/06/23 0218 11/07/23 0320  WBC 8.0 7.2 5.8 5.5   Microbiology Recent Results (from the past 240 hours)  Blood Culture (routine x 2)     Status: None (Preliminary result)   Collection Time: 11/03/23 10:26 AM   Specimen: BLOOD RIGHT ARM  Result Value Ref Range Status   Specimen Description BLOOD RIGHT ARM  Final   Special Requests   Final    BOTTLES DRAWN AEROBIC AND ANAEROBIC Blood Culture results may not be optimal due to an inadequate volume of blood received in culture bottles   Culture   Final    NO GROWTH 4 DAYS Performed at [facility], [address], [place], [place] [address]    Report Status PENDING  Incomplete  Blood Culture (routine x 2)     Status: None (Preliminary result)   Collection Time: 11/03/23 10:31 AM   Specimen: BLOOD LEFT ARM  Result Value Ref Range Status   Specimen Description BLOOD LEFT ARM  Final   Special Requests   Final    BOTTLES DRAWN AEROBIC AND ANAEROBIC Blood Culture results may not be optimal due to an inadequate volume of blood received in culture bottles   Culture   Final    NO GROWTH 4 DAYS Performed at [facility], [address], [place], [place] [address]    Report Status PENDING  Incomplete  Resp panel by RT-PCR (RSV, Flu A&B, Covid) Anterior Nasal Swab     Status: None   Collection Time: 11/03/23 10:46 AM   Specimen: Anterior Nasal Swab  Result Value Ref Range Status   SARS Coronavirus 2 by RT PCR NEGATIVE NEGATIVE Final   Influenza A by PCR NEGATIVE NEGATIVE Final   Influenza B by PCR NEGATIVE NEGATIVE Final    Comment: (NOTE) The Xpert Xpress SARS-CoV-2/FLU/RSV plus assay is intended as an aid in the diagnosis of influenza from Nasopharyngeal swab specimens and should not be used as a sole basis for treatment. Nasal washings and aspirates are unacceptable for Xpert Xpress  SARS-CoV-2/FLU/RSV testing.  Fact Sheet for Patients: bloggercourse.com  Fact Sheet for Healthcare Providers: seriousbroker.it  This test is not yet approved or cleared by the [place]  FDA and has been authorized for detection and/or diagnosis of SARS-CoV-2 by FDA under an Emergency Use Authorization (EUA). This EUA will remain in effect (meaning this test can be used) for the duration of the COVID-19 declaration under Section 564(b)(1) of the Act, 21 U.S.C. section 360bbb-3(b)(1), unless the authorization is terminated or revoked.     Resp Syncytial Virus by PCR NEGATIVE NEGATIVE Final    Comment: (NOTE) Fact Sheet for Patients: bloggercourse.com  Fact Sheet for Healthcare Providers: seriousbroker.it  This test is not yet approved or cleared by the [place]  FDA and has been authorized for detection and/or diagnosis of SARS-CoV-2 by FDA under an Emergency Use Authorization (EUA). This EUA will remain in effect (meaning this test can be used) for the duration of the COVID-19 declaration under Section 564(b)(1) of the Act, 21 U.S.C. section 360bbb-3(b)(1), unless the authorization is terminated or revoked.  Performed at [facility], [address] [place], [place] [address]   Urine Culture     Status: Abnormal   Collection Time: 11/03/23 12:00 PM   Specimen: Urine, Random  Result Value Ref Range Status   Specimen Description URINE, RANDOM  Final   Special Requests   Final    NONE Reflexed from [id number] Performed at [facility], [address], [place], [place] [address]    Culture (A)  Final    >=100,000 COLONIES/mL ESCHERICHIA COLI Confirmed Extended Spectrum Beta-Lactamase Producer (ESBL).  In bloodstream infections from ESBL organisms, carbapenems are preferred over piperacillin/tazobactam. They are shown to have a lower risk of mortality. 40,000  COLONIES/mL PROTEUS MIRABILIS    Report Status 11/06/2023 FINAL  Final   Organism ID, Bacteria ESCHERICHIA COLI (A)  Final   Organism ID, Bacteria PROTEUS MIRABILIS (A)  Final      Susceptibility   Escherichia coli - MIC*    AMPICILLIN >=32 RESISTANT Resistant     CEFAZOLIN  (URINE) Value in next row Resistant      >=32 RESISTANTThis is a modified FDA-approved test that has been validated and its performance characteristics determined by the reporting laboratory.  This laboratory is certified under the Clinical Laboratory Improvement Amendments CLIA as qualified to perform high complexity clinical laboratory testing.    CEFEPIME  Value in next row Resistant      >=32 RESISTANTThis is a modified FDA-approved test that has been validated and its performance characteristics determined by the reporting laboratory.  This laboratory is certified under the Clinical Laboratory Improvement Amendments CLIA as qualified to perform high complexity clinical laboratory testing.    ERTAPENEM Value in next row Sensitive      >=32 RESISTANTThis is a modified FDA-approved test that has been validated and its performance characteristics determined by the reporting laboratory.  This laboratory is certified under the Clinical Laboratory Improvement Amendments CLIA as qualified to perform high complexity clinical laboratory testing.  CEFTRIAXONE  Value in next row Resistant      >=32 RESISTANTThis is a modified FDA-approved test that has been validated and its performance characteristics determined by the reporting laboratory.  This laboratory is certified under the Clinical Laboratory Improvement Amendments CLIA as qualified to perform high complexity clinical laboratory testing.    CIPROFLOXACIN Value in next row Resistant      >=32 RESISTANTThis is a modified FDA-approved test that has been validated and its performance characteristics determined by the reporting laboratory.  This laboratory is certified under the  Clinical Laboratory Improvement Amendments CLIA as qualified to perform high complexity clinical laboratory testing.    GENTAMICIN Value in next row Sensitive      >=32 RESISTANTThis is a modified FDA-approved test that has been validated and its performance characteristics determined by the reporting laboratory.  This laboratory is certified under the Clinical Laboratory Improvement Amendments CLIA as qualified to perform high complexity clinical laboratory testing.    NITROFURANTOIN Value in next row Sensitive      >=32 RESISTANTThis is a modified FDA-approved test that has been validated and its performance characteristics determined by the reporting laboratory.  This laboratory is certified under the Clinical Laboratory Improvement Amendments CLIA as qualified to perform high complexity clinical laboratory testing.    TRIMETH /SULFA  Value in next row Resistant      >=32 RESISTANTThis is a modified FDA-approved test that has been validated and its performance characteristics determined by the reporting laboratory.  This laboratory is certified under the Clinical Laboratory Improvement Amendments CLIA as qualified to perform high complexity clinical laboratory testing.    AMPICILLIN/SULBACTAM Value in next row Sensitive      >=32 RESISTANTThis is a modified FDA-approved test that has been validated and its performance characteristics determined by the reporting laboratory.  This laboratory is certified under the Clinical Laboratory Improvement Amendments CLIA as qualified to perform high complexity clinical laboratory testing.    PIP/TAZO Value in next row Sensitive      <=4 SENSITIVEThis is a modified FDA-approved test that has been validated and its performance characteristics determined by the reporting laboratory.  This laboratory is certified under the Clinical Laboratory Improvement Amendments CLIA as qualified to perform high complexity clinical laboratory testing.    MEROPENEM Value in next row  Sensitive      <=4 SENSITIVEThis is a modified FDA-approved test that has been validated and its performance characteristics determined by the reporting laboratory.  This laboratory is certified under the Clinical Laboratory Improvement Amendments CLIA as qualified to perform high complexity clinical laboratory testing.    * >=100,000 COLONIES/mL ESCHERICHIA COLI   Proteus mirabilis - MIC*    AMPICILLIN Value in next row Sensitive      <=4 SENSITIVEThis is a modified FDA-approved test that has been validated and its performance characteristics determined by the reporting laboratory.  This laboratory is certified under the Clinical Laboratory Improvement Amendments CLIA as qualified to perform high complexity clinical laboratory testing.    CEFAZOLIN  (URINE) Value in next row Sensitive      4 SENSITIVEThis is a modified FDA-approved test that has been validated and its performance characteristics determined by the reporting laboratory.  This laboratory is certified under the Clinical Laboratory Improvement Amendments CLIA as qualified to perform high complexity clinical laboratory testing.    CEFEPIME  Value in next row Sensitive      4 SENSITIVEThis is a modified FDA-approved test that has been validated and its performance characteristics determined by the reporting laboratory.  This  laboratory is certified under the Clinical Laboratory Improvement Amendments CLIA as qualified to perform high complexity clinical laboratory testing.    ERTAPENEM Value in next row Sensitive      4 SENSITIVEThis is a modified FDA-approved test that has been validated and its performance characteristics determined by the reporting laboratory.  This laboratory is certified under the Clinical Laboratory Improvement Amendments CLIA as qualified to perform high complexity clinical laboratory testing.    CEFTRIAXONE  Value in next row Sensitive      4 SENSITIVEThis is a modified FDA-approved test that has been validated and its  performance characteristics determined by the reporting laboratory.  This laboratory is certified under the Clinical Laboratory Improvement Amendments CLIA as qualified to perform high complexity clinical laboratory testing.    CIPROFLOXACIN Value in next row Sensitive      4 SENSITIVEThis is a modified FDA-approved test that has been validated and its performance characteristics determined by the reporting laboratory.  This laboratory is certified under the Clinical Laboratory Improvement Amendments CLIA as qualified to perform high complexity clinical laboratory testing.    GENTAMICIN Value in next row Sensitive      4 SENSITIVEThis is a modified FDA-approved test that has been validated and its performance characteristics determined by the reporting laboratory.  This laboratory is certified under the Clinical Laboratory Improvement Amendments CLIA as qualified to perform high complexity clinical laboratory testing.    NITROFURANTOIN Value in next row Resistant      4 SENSITIVEThis is a modified FDA-approved test that has been validated and its performance characteristics determined by the reporting laboratory.  This laboratory is certified under the Clinical Laboratory Improvement Amendments CLIA as qualified to perform high complexity clinical laboratory testing.    TRIMETH /SULFA  Value in next row Sensitive      4 SENSITIVEThis is a modified FDA-approved test that has been validated and its performance characteristics determined by the reporting laboratory.  This laboratory is certified under the Clinical Laboratory Improvement Amendments CLIA as qualified to perform high complexity clinical laboratory testing.    AMPICILLIN/SULBACTAM Value in next row Sensitive      4 SENSITIVEThis is a modified FDA-approved test that has been validated and its performance characteristics determined by the reporting laboratory.  This laboratory is certified under the Clinical Laboratory Improvement Amendments CLIA as  qualified to perform high complexity clinical laboratory testing.    PIP/TAZO Value in next row Sensitive      <=4 SENSITIVEThis is a modified FDA-approved test that has been validated and its performance characteristics determined by the reporting laboratory.  This laboratory is certified under the Clinical Laboratory Improvement Amendments CLIA as qualified to perform high complexity clinical laboratory testing.    MEROPENEM Value in next row Sensitive      <=4 SENSITIVEThis is a modified FDA-approved test that has been validated and its performance characteristics determined by the reporting laboratory.  This laboratory is certified under the Clinical Laboratory Improvement Amendments CLIA as qualified to perform high complexity clinical laboratory testing.    * 40,000 COLONIES/mL PROTEUS MIRABILIS     Time coordinating discharge: Over 30 minutes  SIGNED:   Brayton Lye, MD  [facility] 11/07/2023, 12:44 PM Pager   If 7PM-7AM, please contact night-coverage [email or web address] Password [email or web address]

## 2023-11-07 NOTE — NC FL2 (Signed)
 [place]  MEDICAID FL2 LEVEL OF CARE FORM     IDENTIFICATION  Patient Name: Andrea Kirk Birthdate: Jul 03, 1940 Sex: female Admission Date (Current Location): 11/03/2023  [place] and [place] Number:  [place] [id number] Facility and Address:  [facility], [address], [place], [place] [address]      Provider Number: [id number]  Attending Physician Name and Address:  Sherlon Brayton RAMAN, MD  Relative Name and Phone Number:  Joslyn,Dr [name] Burrows [phone]    Current Level of Care: Hospital Recommended Level of Care: Skilled Nursing Facility Prior Approval Number:    Date Approved/Denied: 11/06/23 PASRR Number: [id number]  Discharge Plan: SNF    Current Diagnoses: Patient Active Problem List   Diagnosis Date Noted   Abnormal transaminases 11/03/2023   High anion gap metabolic acidosis 11/03/2023   Uremia 11/03/2023   NSTEMI (non-ST elevated myocardial infarction) (HCC) 11/03/2023   Acute on chronic diastolic CHF (congestive heart failure) (HCC) 11/03/2023   Acute CVA (cerebrovascular accident) (HCC) 11/03/2023   Preop examination 10/22/2023   Fall 10/22/2023   Displaced intertrochanteric fracture of right femur, initial encounter for closed fracture (HCC) 10/21/2023   Chronic respiratory failure with hypoxia (HCC) 10/21/2023   Bilateral sensorineural hearing loss 07/25/2022   History of sepsis 2/2 UTI 05/28/2022   Nausea 02/17/2021   C. difficile colitis 02/15/2021   Elevated troponin 02/14/2021   Gram-negative bacteremia 02/14/2021   Thrombocytopenia 02/13/2021   Acute encephalopathy 02/13/2021   Anxiety and depression 02/13/2021   Closed fracture of acromial process of right scapula 11/21/2020   Syncope, vasovagal 06/23/2020   Pulmonary hypertension, unspecified (HCC) 04/20/2020   Controlled type 2 diabetes mellitus with hyperglycemia (HCC) 04/20/2020   Essential hypertension 04/20/2020   HLD (hyperlipidemia) 04/20/2020   CKD  (chronic kidney disease) stage 2, GFR 60-89 ml/min 04/20/2020   Iron deficiency anemia 04/20/2020   Obese 04/19/2020   Chronic back pain 04/19/2020   hx of CVA (cerebral vascular accident) (HCC) 04/16/2020   Macular retinoschisis, 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   Vitreomacular traction syndrome, 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   Depression 12/22/2017   Chronic pain 12/22/2017   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   Chronic diastolic CHF (congestive heart failure) (HCC) 12/22/2017   Acute renal failure superimposed on stage 2 chronic kidney disease 11/07/2016   AKI (acute kidney injury) 08/04/2016   Frequent falls 07/16/2016   Carotid artery disease 04/16/2013   Coronary artery disease 03/11/2013   Occlusion and stenosis of carotid artery without mention of cerebral infarction 07/19/2011    Orientation RESPIRATION BLADDER Height & Weight     Self, Time, Situation, Place  O2 (1L/min) Incontinent Weight: 169 lb 12.1 oz (77 kg) Height:  5' 2 (157.5 cm)  BEHAVIORAL SYMPTOMS/MOOD NEUROLOGICAL BOWEL NUTRITION STATUS      Incontinent Diet (see d/c summary)  AMBULATORY STATUS COMMUNICATION OF NEEDS Skin   Extensive Assist Verbally Surgical wounds                       Personal Care Assistance Level of Assistance  Bathing, Feeding, Dressing Bathing Assistance: Maximum assistance Feeding assistance: Maximum assistance Dressing Assistance: Maximum assistance     Functional Limitations Info    Sight Info: Impaired (eyeglasses)        SPECIAL CARE FACTORS FREQUENCY  PT (By licensed PT), OT (By licensed OT)     PT Frequency: 5x/week OT Frequency: 5x/week            Contractures Contractures Info: Present  Additional Factors Info  Code Status, Allergies, Insulin  Sliding Scale Code Status Info: FULL Allergies Info: Latex, Codeine, Morphine  And Codeine, Cyclobenzaprine   Insulin  Sliding Scale Info: see d/c summary       Current Medications (11/07/2023):  This is the current  hospital active medication list Current Facility-Administered Medications  Medication Dose Route Frequency Provider Last Rate Last Admin   acetaminophen  (TYLENOL ) tablet 650 mg  650 mg Oral Q6H PRN Elgergawy, Dawood S, MD       albuterol  (PROVENTIL ) (2.5 MG/3ML) 0.083% nebulizer solution 2.5 mg  2.5 mg Inhalation Q6H PRN Singh, Prashant K, MD       aspirin  chewable tablet 81 mg  81 mg Oral Daily Singh, Prashant K, MD   81 mg at 11/06/23 1013   bisacodyl (DULCOLAX) suppository 10 mg  10 mg Rectal Daily PRN Elgergawy, Dawood S, MD       calcium -vitamin D (OSCAL WITH D) 500-5 MG-MCG per tablet 1 tablet  1 tablet Oral BID WC Elgergawy, Dawood S, MD   1 tablet at 11/06/23 1814   Chlorhexidine  Gluconate Cloth 2 % PADS 6 each  6 each Topical Daily Singh, Prashant K, MD   6 each at 11/06/23 1013   clopidogrel  (PLAVIX ) tablet 75 mg  75 mg Oral Daily Jerri Pfeiffer, MD   75 mg at 11/06/23 1013   cyanocobalamin  (VITAMIN B12) tablet 1,000 mcg  1,000 mcg Oral Once per day on Monday Tuesday Wednesday Thursday Friday Singh, Prashant K, MD   1,000 mcg at 11/06/23 1013   gabapentin  (NEURONTIN ) capsule 300 mg  300 mg Oral BID Pham, Minh Q, RPH-CPP   300 mg at 11/06/23 2309   heparin  injection 5,000 Units  5,000 Units Subcutaneous Q8H Singh, Prashant K, MD   5,000 Units at 11/07/23 9370   HYDROcodone -acetaminophen  (NORCO/VICODIN) 5-325 MG per tablet 1 tablet  1 tablet Oral Q4H PRN Elgergawy, Dawood S, MD   1 tablet at 11/07/23 0406   insulin  aspart (novoLOG ) injection 0-15 Units  0-15 Units Subcutaneous TID WC Melvin, Alexander B, MD   3 Units at 11/06/23 1813   lactated ringers  infusion   Intravenous Continuous Elgergawy, Dawood S, MD       meropenem (MERREM) 1 g in sodium chloride  0.9 % 100 mL IVPB  1 g Intravenous Q8H Elgergawy, Dawood S, MD 200 mL/hr at 11/07/23 0630 1 g at 11/07/23 0630   oxybutynin  (DITROPAN -XL) 24 hr tablet 10 mg  10 mg Oral Daily Melvin, Alexander B, MD   10 mg at 11/06/23 1013   pantoprazole   (PROTONIX ) EC tablet 40 mg  40 mg Oral BID Elgergawy, Dawood S, MD   40 mg at 11/06/23 2309   polyethylene glycol (MIRALAX / GLYCOLAX) packet 17 g  17 g Oral Daily PRN Elgergawy, Dawood S, MD         Discharge Medications: Please see discharge summary for a list of discharge medications.  Relevant Imaging Results:  Relevant Lab Results:   Additional Information SS#: [id number]  Sharyne Drum, Student-Social Work

## 2023-11-07 NOTE — Discharge Instructions (Signed)
Care After Your Loop Recorder  You have a Armed forces logistics/support/administrative officer your cardiac device site for redness, swelling, and drainage. Call the device clinic at [phone] if you experience these symptoms or fever/chills.  If you notice bleeding from your site, hold firm, but gently pressure with two fingers for 5 minutes. Dried blood on the steri-strips when removing the outer bandage is normal.   Keep the large square bandage on your site for 24 hours and then you may remove it yourself. Keep the steri-strips underneath in place.   You may shower after 72 hours / 3 days from your procedure with the steri-strips in place. They will usually fall off on their own, or may be removed after 10 days. Pat dry.   Avoid lotions, ointments, or perfumes over your incision until it is well-healed.  Please do not submerge in water until your site is completely healed.   Your device is MRI compatible.   Remote monitoring is used to monitor your cardiac device from home. This monitoring is scheduled every month by our office. It allows [place] to keep an eye on the function of your device to ensure it is working properly.

## 2023-11-07 NOTE — Plan of Care (Signed)
  Problem: Education: Goal: Knowledge of disease or condition will improve Outcome: Progressing   Problem: Coping: Goal: Will identify appropriate support needs Outcome: Progressing   Problem: Health Behavior/Discharge Planning: Goal: Ability to manage health-related needs will improve Outcome: Progressing   Problem: Health Behavior/Discharge Planning: Goal: Ability to manage health-related needs will improve Outcome: Progressing

## 2023-11-07 NOTE — Op Note (Addendum)
 Entered in error  Cathlyn Rhein, MD Orthopaedic Surgery

## 2023-11-07 NOTE — Progress Notes (Signed)
 AVS and signed script in packet for discharge

## 2023-11-07 NOTE — Plan of Care (Signed)
  Problem: Education: Goal: Knowledge of disease or condition will improve Outcome: Progressing   Problem: Ischemic Stroke/TIA Tissue Perfusion: Goal: Complications of ischemic stroke/TIA will be minimized Outcome: Progressing   Problem: Coping: Goal: Will identify appropriate support needs Outcome: Progressing   Problem: Health Behavior/Discharge Planning: Goal: Ability to manage health-related needs will improve Outcome: Progressing   Problem: Health Behavior/Discharge Planning: Goal: Ability to manage health-related needs will improve Outcome: Progressing

## 2023-11-07 NOTE — Plan of Care (Signed)
  Problem: Education: Goal: Knowledge of disease or condition will improve Outcome: Progressing Goal: Knowledge of secondary prevention will improve (MUST DOCUMENT ALL) Outcome: Progressing Goal: Knowledge of patient specific risk factors will improve (DELETE if not current risk factor) Outcome: Progressing   Problem: Coping: Goal: Will verbalize positive feelings about self Outcome: Progressing Goal: Will identify appropriate support needs Outcome: Progressing

## 2023-11-07 NOTE — TOC Progression Note (Signed)
 Transition of Care [facility]) - Progression Note    Patient Details  Name: Andrea Kirk MRN: [id number] Date of Birth: Oct 19, 1940  Transition of Care [facility]) CM/SW Contact  Inocente GORMAN Kindle, LCSW Phone Number: 11/07/2023, 10:13 AM  Clinical Narrative:    [facility] requested CSW submit for rehab authorization. CSW submitted clinicals for review, Ref# [id number]. This will not prevent patient from discharging.    Expected Discharge Plan: Skilled Nursing Facility Barriers to Discharge: Continued Medical Work up               Expected Discharge Plan and Services In-house Referral: Clinical Social Work   Post Acute Care Choice: Skilled Nursing Facility Living arrangements for the past 2 months: Skilled Nursing Facility                                       Social Drivers of Health (SDOH) Interventions SDOH Screenings   Food Insecurity: Patient Unable To Answer (11/03/2023)  Housing: Unknown (11/03/2023)  Transportation Needs: Patient Unable To Answer (11/03/2023)  Utilities: Patient Unable To Answer (11/03/2023)  Social Connections: Unknown (11/03/2023)  Tobacco Use: Low Risk  (10/22/2023)    Readmission Risk Interventions     No data to display

## 2023-11-07 NOTE — TOC Transition Note (Signed)
 Transition of Care [facility]) - Discharge Note   Patient Details  Name: Andrea Kirk MRN: [id number] Date of Birth: 13-Jul-1940  Transition of Care [facility]) CM/SW Contact:  Inocente GORMAN Kindle, LCSW Phone Number: 11/07/2023, 2:10 PM   Clinical Narrative:    Patient will DC to: [facility] Anticipated DC date: 11/07/23 Family notified: Brother, Dr. Vukelich Transport by: [facility]   Per MD patient ready for DC to [facility]. RN to call report prior to discharge [phone] (rachel) room 216). RN, patient, patient's family, and facility notified of DC. Discharge Summary and FL2 sent to facility. DC packet on chart. Ambulance transport requested for patient.   CSW will sign off for now as social work intervention is no longer needed. Please consult us  again if new needs arise.       Barriers to Discharge: Barriers Resolved   Patient Goals and CMS Choice Patient states their goals for this hospitalization and ongoing recovery are:: Return to snf          Discharge Placement   Existing PASRR number confirmed : 11/07/23          Patient [facility] bed at: [facility] Patient to be transferred to facility by: [facility] Name of family member notified: Brother Patient and family notified of of transfer: 11/07/23  Discharge Plan and Services Additional resources added to the After Visit Summary for   In-house Referral: Clinical Social Work   Post Acute Care Choice: Skilled Nursing Facility                               Social Drivers of Health (SDOH) Interventions SDOH Screenings   Food Insecurity: Patient Unable To Answer (11/03/2023)  Housing: Unknown (11/03/2023)  Transportation Needs: Patient Unable To Answer (11/03/2023)  Utilities: Patient Unable To Answer (11/03/2023)  Social Connections: Unknown (11/03/2023)  Tobacco Use: Low Risk  (10/22/2023)     Readmission Risk Interventions     No data to display

## 2023-11-07 NOTE — Consult Note (Addendum)
 ELECTROPHYSIOLOGY CONSULT NOTE  Patient ID: Andrea Kirk MRN: [id number], DOB/AGE: 82-14-1942   Admit date: 11/03/2023 Date of Consult: 11/07/2023  Primary Physician: System, Provider Not In Primary Cardiologist: Lonni CROME Nanas, MD  Primary Electrophysiologist: New to Dr. Inocencio Reason for Consultation: Cryptogenic stroke; recommendations regarding Implantable Loop Recorder Insurance: [facility] Medicare  History of Present Illness EP has been asked to evaluate Andrea Kirk for placement of an implantable loop recorder to monitor for atrial fibrillation by Dr Jerri.  The patient was admitted on 11/03/2023 with AMS.    The pt has undergone workup for stroke including:  Stroke: Multifocal bilateral anterior and posterior circulation infarcts, largest at left cerebellum, likely embolic etiology Code Stroke CT head: Multiple acute to subacute appearing infarcts in both cerebral hemispheres and the left cerebellum (largest). Pattern suggests a recent embolic event. No hemorrhagic transformation or intracranial mass effect.  MRI: Multifocal acute infarcts with dominant involvement of the left cerebellum in the left PICA territory with associated edema causing mild local mass effect. Additional acute infarcts in the right cerebellum, left frontal lobe, bilateral posterior frontal and parietal lobes, right occipital lobe, and left occipital cortex. MRA: No acute findings. 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 Mild atherosclerotic irregularity of the bilateral carotid siphons.  2D Echo EF >75% Bilateral LE venous US  - negative for DVT Carotid duplex US  - L ICA 40-59% stenosis  Toshio Slusher consider loop recorder on discharge, cardiology EP aware LDL 32 HgbA1c 5.7 VTE prophylaxis - SQ Heparin  Patient taking Lovenox  prophylaxis dose post-hip fracture repair prior to admission, now on aspirin  81 mg daily and plavix  DAPT for 3 weeks and then ASA alone.   Therapy recommendations:  SNF Disposition: Today  Of note, had stroke 04/2020 and was consulted for loop but refused at that time.    The patient has been monitored on telemetry which has demonstrated sinus rhythm with no arrhythmias.  Inpatient stroke work-up [name] not require a TEE per Neurology.   Echocardiogram as above. Lab work is reviewed.  Prior to admission, the patient denies chest pain, shortness of breath, dizziness, palpitations, or syncope.  She is recovering from her stroke with plans to rehab at SNF  at discharge.  Allergies, Past Medical, Surgical, Social, and Family Histories have been reviewed and are referenced here-in when relevant for medical decision making.   Inpatient Medications:   aspirin   81 mg Oral Daily   calcium -vitamin D  1 tablet Oral BID WC   Chlorhexidine  Gluconate Cloth  6 each Topical Daily   clopidogrel   75 mg Oral Daily   cyanocobalamin   1,000 mcg Oral Once per day on Monday Tuesday Wednesday Thursday Friday   gabapentin   300 mg Oral BID   heparin  injection (subcutaneous)  5,000 Units Subcutaneous Q8H   insulin  aspart  0-15 Units Subcutaneous TID WC   oxybutynin   10 mg Oral Daily   pantoprazole   40 mg Oral BID    Physical Exam: Vitals:   11/06/23 2209 11/07/23 0051 11/07/23 0500 11/07/23 0759  BP: (!) 155/78 136/80 (!) 158/80 (!) 165/71  Pulse:    (!) 59  Resp:    16  Temp: 98.5 F (36.9 C) 99.7 F (37.6 C) 98.8 F (37.1 C) (!) 97.4 F (36.3 C)  TempSrc: Axillary Axillary Axillary Axillary  SpO2:    98%  Weight:   77 kg   Height:        GEN- NAD. A&O x 3. Normal affect. HEENT:  Normocephalic, atraumatic Lungs- CTAB, Normal effort.  Heart- Regular rate and rhythm rate and rhythm. No M/G/R.  Extremities- No peripheral edema. no clubbing or cyanosis Skin- warm and dry, no rash or lesion. Neuro - No focal deficits   12-lead ECG on arrival showed NSR  (personally reviewed) All prior EKG's in EPIC reviewed with no documented atrial fibrillation  Telemetry  SB/NSR 50-70s (personally reviewed)  Assessment and Plan:  1. Cryptogenic stroke The patient presents with cryptogenic stroke.  The patient does not have a TEE planned for this AM.  I spoke at length with the patient about monitoring for afib with an implantable loop recorder, including monthly monitor fees which may range from $0-$40.  Risks, benefits, and alteratives to implantable loop recorder were discussed with the patient today.  At this time, the patient is very clear in their decision to proceed with implantable loop recorder.    Wound care was reviewed with the patient (keep incision clean and dry for 3 days). Please call with questions.   [name], PA-C 11/07/2023 8:10 AM  I have seen and examined this patient with [name].  Agree with above, note added to reflect my findings.  Patient presented to the hospital with a past medical history as above.  She had altered mental status starting yesterday.  In the emergency room, she was had found to have an a diffuse embolic stroke, AKI, shock liver, demand ischemia.  Workup that showed no cause for stroke.  She does have a history of prior stroke.  She has had no arrhythmias on telemetry or EKGs.  GEN: No acute distress.   Neck: No JVD Cardiac: RRR, no murmurs, rubs, or gallops.  Respiratory: normal BS bases bilaterally. GI: Soft, nontender, non-distended  MS: No edema; No deformity. Neuro:  Nonfocal  Skin: warm and dry Psych: Normal affect    Cryptogenic stroke: Patient feeling well currently.  Had cryptogenic stroke yesterday with embolic phenomenon.  She would benefit from ILR for atrial fibrillation monitoring.  Risks and benefits of ILR implant were discussed.  Risk of bleeding and infection.  She understands the risks and has agreed to the procedure.  [name] MD 11/07/2023 1:20 PM

## 2023-11-08 ENCOUNTER — Encounter (HOSPITAL_COMMUNITY): Payer: Self-pay | Admitting: Student

## 2023-11-08 LAB — CULTURE, BLOOD (ROUTINE X 2)
Culture: NO GROWTH
Culture: NO GROWTH

## 2023-11-10 ENCOUNTER — Telehealth: Payer: Self-pay

## 2023-11-10 NOTE — Telephone Encounter (Signed)
 Reviewed with Daphne Barrack, NP in office today. Patient to be seen by AF clinic for [facility] initiation.   LM on patient's VM to call and discuss.

## 2023-11-10 NOTE — Telephone Encounter (Signed)
 ILR alerts: AF detection New implant for CVA  Alert remote transmission: AF 3 AT/AF events, longest duration 1hr , poor rate control, can not rule out AF, ASA only per EPIC - Route to triage   Alert remote transmission: AF 5 AT/AF, longest duration 3hrs , poor rate control, ASA only per EPIC -    Alert remote transmission:  AF AF in progress from 11/1  Forwarding to provider for review and okay to move forward with AF clinic referral.

## 2023-11-11 NOTE — Telephone Encounter (Signed)
 Call back received from transportation with [facility].  Pt scheduled for afib clinic 11/12/2023 at 2:30 pm.

## 2023-11-11 NOTE — Telephone Encounter (Signed)
 Outreach made to Pt's brother per DPR.  Advised Afib found on Pt's loop recorder.  Per brother Pt is a resident at [facility].  Call placed to facility at [phone].  Left message for transportation to call back to schedule follow up with Afib clinic.

## 2023-11-12 ENCOUNTER — Ambulatory Visit (HOSPITAL_COMMUNITY): Admitting: Internal Medicine

## 2023-11-18 ENCOUNTER — Ambulatory Visit (HOSPITAL_COMMUNITY)
Admission: RE | Admit: 2023-11-18 | Discharge: 2023-11-18 | Disposition: A | Discharge status: Home / Self Care | Source: Ambulatory Visit | Attending: Internal Medicine | Admitting: Internal Medicine

## 2023-11-18 ENCOUNTER — Encounter (HOSPITAL_COMMUNITY): Payer: Self-pay | Admitting: Internal Medicine

## 2023-11-18 VITALS — BP 130/82 | HR 84

## 2023-11-18 DIAGNOSIS — I480 Paroxysmal atrial fibrillation: Secondary | ICD-10-CM | POA: Diagnosis not present

## 2023-11-18 DIAGNOSIS — D6869 Other thrombophilia: Secondary | ICD-10-CM | POA: Diagnosis not present

## 2023-11-18 DIAGNOSIS — I4891 Unspecified atrial fibrillation: Secondary | ICD-10-CM

## 2023-11-18 MED ORDER — APIXABAN 5 MG PO TABS: 5.0000 mg | ORAL_TABLET | Freq: Two times a day (BID) | ORAL | 6 refills | Status: AC

## 2023-11-18 NOTE — Progress Notes (Signed)
 Primary Care Physician: System, Provider Not In Primary Cardiologist: Lonni LITTIE Nanas, MD Electrophysiologist: None     Referring Physician: Device clinic     Andrea Kirk is a 83 y.o. female with a history of HTN, HLD, T2DM, CAD s/p CABG, recurrent CVA, carotid artery disease, CKD 2, GERD, chronic diastolic CHF, depression, and anxiety who presents for consultation in the [facility]. Hospital admission 10/27-31/2025 for acute metabolic encephalopathy due to acute CVA, UTI, shock liver and dehydration/AKI. Discharged on ASA and plavix . S/p ILR placement on 10/31. Device clinic alert on 11/3 for new Afib.   On evaluation today, patient is currently in Afib. She is a temporary rehab facility and is in a lot of pain with her hip s/p fracture repair on 10/15. She is taking lovenox  injections through 11/14.   Today, she denies symptoms of palpitations, chest pain, shortness of breath, orthopnea, PND, lower extremity edema, dizziness, presyncope, syncope, bleeding, or neurologic sequela. The patient is tolerating medications without difficulties and is otherwise without complaint today.   she has a BMI of There is no height or weight on file to calculate BMI.. There were no vitals filed for this visit.  Current Outpatient Medications  Medication Sig Dispense Refill   albuterol  (PROVENTIL ) (2.5 MG/3ML) 0.083% nebulizer solution Inhale 3 mLs (2.5 mg total) into the lungs every 6 (six) hours as needed for wheezing or shortness of breath.     amLODipine  (NORVASC ) 10 MG tablet Take 10 mg by mouth daily.     bisacodyl (DULCOLAX) 10 MG suppository Place 1 suppository (10 mg total) rectally daily as needed for severe constipation.     busPIRone  (BUSPAR ) 10 MG tablet Take 1 tablet (10 mg total) by mouth 2 (two) times daily.     calcium  carbonate (TUMS EX) 750 MG chewable tablet Chew 1 tablet by mouth daily as needed for heartburn.     calcium -vitamin D (OSCAL WITH  D) 500-5 MG-MCG tablet Take 1 tablet by mouth 2 (two) times daily with a meal.     Carboxymethylcellulose Sodium (REFRESH TEARS OP) Place 1 drop into both eyes every 4 (four) hours as needed (for dry eye).     carvedilol  (COREG ) 6.25 MG tablet Take 6.25 mg by mouth 2 (two) times daily with a meal.     citalopram  (CELEXA ) 20 MG tablet Take 20 mg by mouth daily.     cyanocobalamin  1000 MCG tablet Take 1 tablet (1,000 mcg total) by mouth daily.     docusate sodium  (COLACE) 100 MG capsule Take 100 mg by mouth 2 (two) times daily.     enoxaparin  (LOVENOX ) 40 MG/0.4ML injection Inject 0.4 mLs (40 mg total) into the skin daily for 15 days. Please continue for next 15 days 6 mL 0   ferrous sulfate  325 (65 FE) MG tablet Take 325 mg by mouth daily. Monday-Friday     furosemide  (LASIX ) 20 MG tablet Take 1 tablet (20 mg total) by mouth daily as needed for fluid or edema. take once daily as needed for weight gain of 2-3 lbs overnight or 5 lbs in 1 week. 90 tablet 3   gabapentin  (NEURONTIN ) 300 MG capsule Take 1 capsule (300 mg total) by mouth 2 (two) times daily.     glipiZIDE  (GLUCOTROL  XL) 5 MG 24 hr tablet Take 1 tablet (5 mg total) by mouth daily with breakfast. 30 tablet 0   insulin  aspart (NOVOLOG ) 100 UNIT/ML injection Inject 0-15 Units into the skin 3 (  three) times daily with meals. 0-15 Units, Subcutaneous, 3 times daily with meals, First dose on Mon 11/03/23 at 1700 Correction coverage: Moderate (average weight, post-op) CBG < 70: Implement Hypoglycemia Standing Orders and refer to Hypoglycemia Standing Orders sidebar report CBG 70 - 120: 0 units CBG 121 - 150: 2 units CBG 151 - 200: 3 units CBG 201 - 250: 5 units CBG 251 - 300: 8 units CBG 301 - 350: 11 units CBG 351 - 400: 15 units     Magnesium  Oxide -Mg Supplement 250 MG TABS Take 250 mg by mouth daily.     melatonin 3 MG TABS tablet Take 3 mg by mouth at bedtime.     methocarbamol  (ROBAXIN ) 500 MG tablet Take 500 mg by mouth every 8 (eight) hours as  needed for muscle spasms.     oxybutynin  (DITROPAN -XL) 10 MG 24 hr tablet Take 10 mg by mouth daily.     oxyCODONE  (OXY IR/ROXICODONE ) 5 MG immediate release tablet Take 1 tablet (5 mg total) by mouth every 6 (six) hours as needed for severe pain (pain score 7-10). 10 tablet 0   OXYGEN  Inhale 2 L/min into the lungs continuous.     pantoprazole  (PROTONIX ) 40 MG tablet Take 1 tablet (40 mg total) by mouth 2 (two) times daily.     polyethylene glycol (MIRALAX / GLYCOLAX) 17 g packet Take 17 g by mouth daily as needed for moderate constipation.     saccharomyces boulardii (FLORASTOR) 250 MG capsule Take 250 mg by mouth daily.     senna (SENOKOT) 8.6 MG tablet Take 2 tablets by mouth at bedtime.     No current facility-administered medications for this encounter.    Atrial Fibrillation Management history:  Previous antiarrhythmic drugs: none Previous cardioversions: none Previous ablations: none Anticoagulation history: none   ROS- All systems are reviewed and negative except as per the HPI above.  Physical Exam: BP 130/82   Pulse 84   GEN: Patient is in acute pain and making noises throughout visit. She is alert and oriented lying in an EMS stretcher. NECK: No JVD; No carotid bruits CARDIAC: Irregularly irregular rate and rhythm, no murmurs, rubs, gallops RESPIRATORY:  Clear to auscultation without rales, wheezing or rhonchi  ABDOMEN: Soft, non-tender, non-distended EXTREMITIES:  No edema; No deformity   EKG today demonstrates  Vent. rate 84 BPM PR interval * ms QRS duration 92 ms QT/QTcB 380/449 ms P-R-T axes * 49 118 Atrial fibrillation Incomplete right bundle branch block Nonspecific ST and T wave abnormality Abnormal ECG When compared with ECG of 03-Nov-2023 10:47, PREVIOUS ECG IS PRESENT Confirmed by Terra Pac ([id number]) on 11/18/2023 11:59:41 AM  Echo 10/22/23 demonstrated   1. Left ventricular ejection fraction, by estimation, is >75%. The left  ventricle has  hyperdynamic function. The left ventricle has no regional  wall motion abnormalities. Left ventricular diastolic parameters are  consistent with Grade II diastolic  dysfunction (pseudonormalization).   2. Right ventricular systolic function is normal. The right ventricular  size is normal. There is severely elevated pulmonary artery systolic  pressure.   3. Left atrial size was moderately dilated.   4. Right atrial size was mildly dilated.   5. The mitral valve is normal in structure. Trivial mitral valve  regurgitation. No evidence of mitral stenosis.   6. The aortic valve is tricuspid. Aortic valve regurgitation is not  visualized. Mild aortic valve stenosis.   7. Aortic dilatation noted. There is borderline dilatation of the  ascending aorta, measuring  39 mm.   8. The inferior vena cava is normal in size with greater than 50%  respiratory variability, suggesting right atrial pressure of 3 mmHg.   ASSESSMENT & PLAN CHA2DS2-VASc Score = 9  The patient's score is based upon: CHF History: 1 HTN History: 1 Diabetes History: 1 Stroke History: 2 Vascular Disease History: 1 Age Score: 2 Gender Score: 1       ASSESSMENT AND PLAN: Paroxysmal Atrial Fibrillation (ICD10:  I48.0) The patient's CHA2DS2-VASc score is 9, indicating a 12.2% annual risk of stroke.    Patient is Afib. Entire office visit also discussed with power of attorney (patient's brother Dr. Weiland) over the phone. Education provided about Afib. Discussion about medication treatments and ablation going forward if indicated. After discussion, we will proceed with anticoagulation and follow up to determine if requires cardioversion.   Secondary Hypercoagulable State (ICD10:  D68.69) The patient is at significant risk for stroke/thromboembolism based upon her CHA2DS2-VASc Score of 9.  Start Apixaban (Eliquis).  We discussed the reasoning behind anticoagulation in the setting of stroke prevention related to Afib. We discussed  the benefits vs risks of anticoagulation. After discussion, patient would like to begin anticoagulation and understands the potential risks. Will begin Eliquis 5 mg BID after conclusion of lovenox  injections. Stop ASA and plavix . Dosage of Eliquis 5 mg is correct based on weight > 60 kg and creatinine < 1.5.     Follow up 3 weeks Afib clinic.    Terra Pac, [facility]  Afib Clinic [address] [place], [place] [address] [phone]

## 2023-11-18 NOTE — Patient Instructions (Signed)
 Stop aspirin  and Plavix    Start 11/22/23 Eliquis 5mg  twice a day AFTER Lovenox  completed

## 2023-11-21 ENCOUNTER — Ambulatory Visit: Admitting: Gastroenterology

## 2023-11-24 ENCOUNTER — Ambulatory Visit: Admitting: Gastroenterology

## 2023-11-24 NOTE — Progress Notes (Unsigned)
 Cardiology Office Note:    Date:  11/25/2023   ID:  Andrea Kirk, DOB [birth year], MRN [id number]  PCP:  System, Provider Not In  Cardiologist:  Lonni CROME Nanas, MD  Electrophysiologist:  None   Referring MD: Darreld Agent, DO   Chief Complaint  Patient presents with   Atrial Fibrillation    History of Present Illness:    Andrea Kirk is a 83 y.o. female with a hx of CAD status post CABG in 1997, chronic diastolic heart failure, pulmonary hypertension, CVA, T2DM who presents for follow-up.  She was admitted with hip fracture 10/2023.  Echocardiogram 10/22/2023 showed EF greater than 75%, normal RV function, severely elevated pulmonary pressures, mild aortic stenosis.  She did well during surgery for her hip fracture.  Subsequently presented back to hospital 11/03/2023 with encephalopathy, found to have acute stroke.  Echocardiogram showed EF 65 to 70%, mild RV dysfunction, negative bubble study.  She underwent loop recorder insertion on 11/07/2023.  She was found on loop to have new A-fib on 11/3.  Since  discharge from the hospital, she reports she has been having hip pain. Denies any chest pain, dyspnea,  lower extremity edema, or palpitations.  Reports some lightheadedness but denies any syncope.  Does report some swelling in her leg.    Past Medical History:  Diagnosis Date   Acute kidney injury 08/05/2016   Acute respiratory failure with hypoxia (HCC) 04/19/2020   Acute stroke due to ischemia [facility])    Aftercare following surgery of the circulatory system, NEC 05/11/2013   AKI (acute kidney injury) 08/04/2016   Anxiety    takes Celexa  daily   ARF (acute renal failure) 11/07/2016   Asymptomatic stenosis of right carotid artery 03/22/2016   Back pain    occasionally   Carotid artery disease 04/16/2013   Carotid artery occlusion    Carotid stenosis 11/16/2013   Cataract    left and immature   Coronary artery disease    Coronary atherosclerosis of native coronary  artery 03/11/2013   S/p CABG in 1997    Depression    Diabetes mellitus    takes Metformin  and Glipizide  daily   Diabetes mellitus (HCC) 05/02/2015   Dizziness    takes Meclizine  daily as needed   Essential hypertension, benign 03/11/2013   GERD (gastroesophageal reflux disease)    takes Omeprazole daily as needed   Headache(784.0)    Hyperlipidemia    takes Atorvastatin  daily   Hypertension    takes Carvedilol  daily   Mixed hyperlipidemia 03/11/2013   Muscle spasm    takes Robaxin  daily as needed   Nausea    takes Phenergan  daily as needed   Occlusion and stenosis of carotid artery without mention of cerebral infarction 07/19/2011   Pneumonia    hx of-in high school   Restless leg    takes Requip  daily as needed   Seasonal allergies    takes Claritin  daily as needed and Afrin as needed   Severe sepsis (HCC) 02/14/2021   Shortness of breath    with exertion   Urinary urgency    UTI (urinary tract infection) 11/07/2016    Past Surgical History:  Procedure Laterality Date   COLONOSCOPY WITH PROPOFOL  N/A 03/10/2012   Procedure: COLONOSCOPY WITH PROPOFOL ;  Surgeon: Gladis MARLA Louder, MD;  Location: [facility];  Service: Endoscopy;  Laterality: N/A;   CORNEAL TRANSPLANT Right    CORONARY ARTERY BYPASS GRAFT  1997   x 6   CORONARY ARTERY BYPASS  GRAFT  Jan. 1997   ENDARTERECTOMY Left 04/16/2013   Procedure: Left Carotid Artery Endatarectomy with Resection of Redundant Internal Carotid Artery;  Surgeon: Krystal JULIANNA Doing, MD;  Location: [facility] OR;  Service: Vascular;  Laterality: Left;   ENDARTERECTOMY Right 03/22/2016   Procedure: RIGHT ENDARTERECTOMY CAROTID;  Surgeon: Krystal JULIANNA Doing, MD;  Location: [facility] OR;  Service: Vascular;  Laterality: Right;   ESOPHAGOGASTRODUODENOSCOPY N/A 03/10/2012   Procedure: ESOPHAGOGASTRODUODENOSCOPY (EGD);  Surgeon: Gladis MARLA Louder, MD;  Location: [facility];  Service: Endoscopy;  Laterality: N/A;   EYE SURGERY  March 12, 2001   CORNEA TRANSPLANT Right eye    INTRAMEDULLARY (IM) NAIL INTERTROCHANTERIC Right 10/22/2023   Procedure: FIXATION, FRACTURE, INTERTROCHANTERIC, WITH INTRAMEDULLARY ROD;  Surgeon: Reyne Cordella SQUIBB, MD;  Location: [facility] OR;  Service: Orthopedics;  Laterality: Right;   LOOP RECORDER INSERTION N/A 11/07/2023   Procedure: LOOP RECORDER INSERTION;  Surgeon: Lesia Ozell Barter, PA-C;  Location: [facility] INV[facility] CV LAB;  Service: Cardiovascular;  Laterality: N/A;   PATCH ANGIOPLASTY Right 03/22/2016   Procedure: PATCH ANGIOPLASTY;  Surgeon: Krystal JULIANNA Doing, MD;  Location: [facility] OR;  Service: Vascular;  Laterality: Right;   PR VEIN BYPASS GRAFT,AORTO-FEM-POP  1997   SPINE SURGERY  march 2013   Back surgery   TONSILLECTOMY     TRIGGER FINGER RELEASE Left    thumb    Current Medications: Current Meds  Medication Sig   albuterol  (PROVENTIL ) (2.5 MG/3ML) 0.083% nebulizer solution Inhale 3 mLs (2.5 mg total) into the lungs every 6 (six) hours as needed for wheezing or shortness of breath.   amLODipine  (NORVASC ) 10 MG tablet Take 10 mg by mouth daily.   bisacodyl (DULCOLAX) 10 MG suppository Place 1 suppository (10 mg total) rectally daily as needed for severe constipation.   busPIRone  (BUSPAR ) 10 MG tablet Take 1 tablet (10 mg total) by mouth 2 (two) times daily.   calcium  carbonate (TUMS EX) 750 MG chewable tablet Chew 1 tablet by mouth daily as needed for heartburn.   calcium -vitamin D (OSCAL WITH D) 500-5 MG-MCG tablet Take 1 tablet by mouth 2 (two) times daily with a meal.   Carboxymethylcellulose Sodium (REFRESH TEARS OP) Place 1 drop into both eyes every 4 (four) hours as needed (for dry eye).   carvedilol  (COREG ) 6.25 MG tablet Take 6.25 mg by mouth 2 (two) times daily with a meal.   citalopram  (CELEXA ) 20 MG tablet Take 20 mg by mouth daily.   cyanocobalamin  1000 MCG tablet Take 1 tablet (1,000 mcg total) by mouth daily.   docusate sodium  (COLACE) 100 MG capsule Take 100 mg by mouth 2 (two) times daily.   ferrous sulfate  325 (65  FE) MG tablet Take 325 mg by mouth daily. Monday-Friday   furosemide  (LASIX ) 20 MG tablet Take 1 tablet (20 mg total) by mouth daily as needed for fluid or edema. take once daily as needed for weight gain of 2-3 lbs overnight or 5 lbs in 1 week.   gabapentin  (NEURONTIN ) 300 MG capsule Take 1 capsule (300 mg total) by mouth 2 (two) times daily.   insulin  aspart (NOVOLOG ) 100 UNIT/ML injection Inject 0-15 Units into the skin 3 (three) times daily with meals. 0-15 Units, Subcutaneous, 3 times daily with meals, First dose on Mon 11/03/23 at 1700 Correction coverage: Moderate (average weight, post-op) CBG < 70: Implement Hypoglycemia Standing Orders and refer to Hypoglycemia Standing Orders sidebar report CBG 70 - 120: 0 units CBG 121 - 150: 2 units CBG 151 - 200:  3 units CBG 201 - 250: 5 units CBG 251 - 300: 8 units CBG 301 - 350: 11 units CBG 351 - 400: 15 units   Magnesium  Oxide -Mg Supplement 250 MG TABS Take 250 mg by mouth daily.   melatonin 3 MG TABS tablet Take 3 mg by mouth at bedtime.   methocarbamol  (ROBAXIN ) 500 MG tablet Take 500 mg by mouth every 8 (eight) hours as needed for muscle spasms.   oxybutynin  (DITROPAN -XL) 10 MG 24 hr tablet Take 10 mg by mouth daily.   oxyCODONE  (OXY IR/ROXICODONE ) 5 MG immediate release tablet Take 1 tablet (5 mg total) by mouth every 6 (six) hours as needed for severe pain (pain score 7-10).   pantoprazole  (PROTONIX ) 40 MG tablet Take 1 tablet (40 mg total) by mouth 2 (two) times daily.   polyethylene glycol (MIRALAX / GLYCOLAX) 17 g packet Take 17 g by mouth daily as needed for moderate constipation.   saccharomyces boulardii (FLORASTOR) 250 MG capsule Take 250 mg by mouth daily.   senna (SENOKOT) 8.6 MG tablet Take 2 tablets by mouth at bedtime.   [DISCONTINUED] aspirin  EC 81 MG tablet Take 81 mg by mouth daily. Swallow whole.     Allergies:   Latex, Codeine, Morphine  and codeine, Cyclobenzaprine, and Jardiance [empagliflozin]   Social History    Socioeconomic History   Marital status: Widowed    Spouse name: Not on file   Number of children: Not on file   Years of education: Not on file   Highest education level: Not on file  Occupational History   Not on file  Tobacco Use   Smoking status: Never   Smokeless tobacco: Never   Tobacco comments:    Never smoked 11/18/23  Vaping Use   Vaping status: Never Used  Substance and Sexual Activity   Alcohol  use: No    Alcohol /week: 0.0 standard drinks of alcohol    Drug use: No   Sexual activity: Never    Birth control/protection: Post-menopausal  Other Topics Concern   Not on file  Social History Narrative   Not on file   Social Drivers of Health   Financial Resource Strain: Not on file  Food Insecurity: Patient Unable To Answer (11/03/2023)   Hunger Vital Sign    Worried About Running Out of Food in the Last Year: Patient unable to answer    Ran Out of Food in the Last Year: Patient unable to answer  Transportation Needs: Patient Unable To Answer (11/03/2023)   PRAPARE - Transportation    Lack of Transportation (Medical): Patient unable to answer    Lack of Transportation (Non-Medical): Patient unable to answer  Physical Activity: Not on file  Stress: Not on file  Social Connections: Unknown (11/03/2023)   Social Connection and Isolation Panel    Frequency of Communication with Friends and Family: Patient unable to answer    Frequency of Social Gatherings with Friends and Family: Patient unable to answer    Attends Religious Services: Not on file    Active Member of Clubs or Organizations: Patient unable to answer    Attends Banker Meetings: Patient unable to answer    Marital Status: Patient unable to answer     Family History: The patient's family history includes Alcoholism in her maternal grandfather; Cancer in her maternal grandmother; Cirrhosis in her mother; Heart attack in her father, paternal grandfather, and paternal grandmother; Heart  disease in her brother and paternal grandfather; Heart disease (age of onset: 60) in  her father; Hyperlipidemia in her brother and father.  ROS:   Please see the history of present illness.     All other systems reviewed and are negative.  EKGs/Labs/Other Studies Reviewed:    The following studies were reviewed today:   EKG:   11/25/2023: Atrial fibrillation, rate 97, right axis deviation, nonspecific T wave flattening  Recent Labs: 11/03/2023: B Natriuretic Peptide 673.1 11/07/2023: ALT 101; BUN 12; Creatinine, Ser 0.75; Hemoglobin 9.4; Magnesium  2.2; Platelets 185; Potassium 4.8; Sodium 139  Recent Lipid Panel    Component Value Date/Time   CHOL 66 11/04/2023 0405   CHOL 128 05/21/2022 1231   TRIG 117 11/04/2023 0405   HDL 11 (L) 11/04/2023 0405   HDL 23 (L) 05/21/2022 1231   CHOLHDL 6.0 11/04/2023 0405   VLDL 23 11/04/2023 0405   LDLCALC 32 11/04/2023 0405   LDLCALC 55 05/21/2022 1231    Physical Exam:    VS:  BP 122/76   Pulse 90   Ht 5' 2 (1.575 m)   SpO2 97%   BMI 31.05 kg/m     Wt Readings from Last 3 Encounters:  11/07/23 169 lb 12.1 oz (77 kg)  10/21/23 185 lb 6.5 oz (84.1 kg)  07/14/23 179 lb 8 oz (81.4 kg)     GEN:  Well nourished, well developed in no acute distress HEENT: Normal NECK: No JVD; No carotid bruits LYMPHATICS: No lymphadenopathy CARDIAC: Irregular rate, normal rate, 2 out of 6 systolic murmur RESPIRATORY:  Clear to auscultation without rales, wheezing or rhonchi  ABDOMEN: Soft, non-tender, non-distended MUSCULOSKELETAL: Trace edema SKIN: Warm and dry NEUROLOGIC:  Alert and oriented x 3 PSYCHIATRIC:  Normal affect   ASSESSMENT:    1. Persistent atrial fibrillation (HCC)   2. Coronary artery disease involving native coronary artery of native heart without angina pectoris   3. Chronic diastolic (congestive) heart failure (HCC)   4. Pulmonary hypertension, unspecified (HCC)   5. Essential hypertension   6. Aortic valve stenosis,  etiology of cardiac valve disease unspecified    PLAN:    CAD: Status post CABG in 1997.  Denies any symptoms - Continue Eliquis - Statin has been on hold due to transaminitis.  Recommend repeat CMET to monitor for improvement  Pulmonary hypertension: Severe on echocardiogram 10/22/2023.  Suspect group 2 in setting of chronic diastolic heart failure.  Will need RHC at some point.  Hold off for now a as below s she is recovering from hip surgery and planning to start Eliquis and possible cardioversion  Chronic diastolic heart failure: Recommend monitoring daily weights and can take Lasix  20 mg daily if gains more than 3 pounds in 1 day or 5 pounds in 1 week  CVA: Occurred 10/2023.  Echocardiogram showed EF 65 to 70%, mild RV dysfunction, negative bubble study.  She underwent loop recorder insertion on 11/07/2023.  Found to have new A-fib on monitor on 11/3.  Persistent atrial fibrillation: Diagnosed 11/2023, after ILR was placed following CVA.  CHA2DS2-VASc 9 - She has not started Eliquis.  Recommend starting 5 mg twice daily.  Has follow-up on 12/15 in A-fib clinic, if remains in A-fib would consider cardioversion - Continue carvedilol  6.25 mg twice daily  Aortic stenosis: Mild on echo 10/2023, will monitor  Hypertension: On amlodipine  10 mg daily and carvedilol  6.25 mg twice daily.  Appears controlled  RTC in 3 months  Medication Adjustments/Labs and Tests Ordered: Current medicines are reviewed at length with the patient today.  Concerns regarding medicines are  outlined above.  Orders Placed This Encounter  Procedures   Comprehensive metabolic panel with GFR   EKG 87-Ozji   EKG 12-Lead   No orders of the defined types were placed in this encounter.   Patient Instructions  Medication Instructions:  STOP ASPIRIN    START ELIQUIS 5 MG TWICE A DAY   TAKE FUROSEMIDE  20 MG AS NEEDED FOR WEIGHT GAIN OF 3 POUNDS IN 24 HOURS OR 5 POUNDS IN A WEEK  WEIGH DAILY   *If you need a  refill on your cardiac medications before your next appointment, please call your pharmacy*  Lab Work: CMET SOON - FAX RESULTS TO [phone]  Testing/Procedures: NONE   Follow-Up:  03/02/2024 10:30 AM WITH EMILY M NP AT [facility] LOCATION   We recommend signing up for the patient portal called MyChart.  Sign up information is provided on this After Visit Summary.  MyChart is used to connect with patients for Virtual Visits (Telemedicine).  Patients are able to view lab/test results, encounter notes, upcoming appointments, etc.  Non-urgent messages can be sent to your provider as well.   To learn more about what you can do with MyChart, go to [email or web address].            Signed, Lonni LITTIE Nanas, MD  11/25/2023 1:27 PM    [facility]

## 2023-11-25 ENCOUNTER — Ambulatory Visit (INDEPENDENT_AMBULATORY_CARE_PROVIDER_SITE_OTHER): Admitting: Cardiology

## 2023-11-25 VITALS — BP 122/76 | HR 90 | Ht 62.0 in

## 2023-11-25 DIAGNOSIS — I10 Essential (primary) hypertension: Secondary | ICD-10-CM

## 2023-11-25 DIAGNOSIS — I350 Nonrheumatic aortic (valve) stenosis: Secondary | ICD-10-CM

## 2023-11-25 DIAGNOSIS — I4819 Other persistent atrial fibrillation: Secondary | ICD-10-CM | POA: Diagnosis not present

## 2023-11-25 DIAGNOSIS — I272 Pulmonary hypertension, unspecified: Secondary | ICD-10-CM | POA: Diagnosis not present

## 2023-11-25 DIAGNOSIS — I5032 Chronic diastolic (congestive) heart failure: Secondary | ICD-10-CM

## 2023-11-25 DIAGNOSIS — I2510 Atherosclerotic heart disease of native coronary artery without angina pectoris: Secondary | ICD-10-CM

## 2023-11-25 NOTE — Patient Instructions (Addendum)
 Medication Instructions:  STOP ASPIRIN    START ELIQUIS 5 MG TWICE A DAY   TAKE FUROSEMIDE  20 MG AS NEEDED FOR WEIGHT GAIN OF 3 POUNDS IN 24 HOURS OR 5 POUNDS IN A WEEK  WEIGH DAILY   *If you need a refill on your cardiac medications before your next appointment, please call your pharmacy*  Lab Work: CMET SOON - FAX RESULTS TO [phone]  Testing/Procedures: NONE   Follow-Up:  03/02/2024 10:30 AM WITH EMILY M NP AT [facility] LOCATION   We recommend signing up for the patient portal called MyChart.  Sign up information is provided on this After Visit Summary.  MyChart is used to connect with patients for Virtual Visits (Telemedicine).  Patients are able to view lab/test results, encounter notes, upcoming appointments, etc.  Non-urgent messages can be sent to your provider as well.   To learn more about what you can do with MyChart, go to [email or web address].

## 2023-12-08 ENCOUNTER — Ambulatory Visit: Attending: Cardiology

## 2023-12-08 DIAGNOSIS — I4819 Other persistent atrial fibrillation: Secondary | ICD-10-CM

## 2023-12-08 LAB — CUP PACEART REMOTE DEVICE CHECK
Date Time Interrogation Session: 20251201001400
Implantable Pulse Generator Implant Date: 20251031
Pulse Gen Serial Number: 163477

## 2023-12-11 ENCOUNTER — Ambulatory Visit: Payer: Self-pay | Admitting: Cardiology

## 2023-12-12 NOTE — Progress Notes (Signed)
 Remote Loop Recorder Transmission

## 2023-12-16 ENCOUNTER — Ambulatory Visit: Admitting: Podiatry

## 2023-12-16 ENCOUNTER — Encounter: Payer: Self-pay | Admitting: Podiatry

## 2023-12-16 DIAGNOSIS — E785 Hyperlipidemia, unspecified: Secondary | ICD-10-CM | POA: Insufficient documentation

## 2023-12-16 DIAGNOSIS — E1142 Type 2 diabetes mellitus with diabetic polyneuropathy: Secondary | ICD-10-CM

## 2023-12-16 DIAGNOSIS — I779 Disorder of arteries and arterioles, unspecified: Secondary | ICD-10-CM | POA: Insufficient documentation

## 2023-12-16 DIAGNOSIS — E559 Vitamin D deficiency, unspecified: Secondary | ICD-10-CM | POA: Insufficient documentation

## 2023-12-16 DIAGNOSIS — F329 Major depressive disorder, single episode, unspecified: Secondary | ICD-10-CM | POA: Insufficient documentation

## 2023-12-16 DIAGNOSIS — M2010 Hallux valgus (acquired), unspecified foot: Secondary | ICD-10-CM | POA: Insufficient documentation

## 2023-12-16 DIAGNOSIS — K219 Gastro-esophageal reflux disease without esophagitis: Secondary | ICD-10-CM | POA: Insufficient documentation

## 2023-12-16 DIAGNOSIS — J320 Chronic maxillary sinusitis: Secondary | ICD-10-CM | POA: Insufficient documentation

## 2023-12-16 DIAGNOSIS — Z860100 Personal history of colon polyps, unspecified: Secondary | ICD-10-CM | POA: Insufficient documentation

## 2023-12-16 DIAGNOSIS — G2581 Restless legs syndrome: Secondary | ICD-10-CM | POA: Insufficient documentation

## 2023-12-16 DIAGNOSIS — G629 Polyneuropathy, unspecified: Secondary | ICD-10-CM | POA: Insufficient documentation

## 2023-12-16 DIAGNOSIS — R911 Solitary pulmonary nodule: Secondary | ICD-10-CM | POA: Insufficient documentation

## 2023-12-16 DIAGNOSIS — D333 Benign neoplasm of cranial nerves: Secondary | ICD-10-CM | POA: Insufficient documentation

## 2023-12-16 DIAGNOSIS — E7800 Pure hypercholesterolemia, unspecified: Secondary | ICD-10-CM | POA: Insufficient documentation

## 2023-12-16 DIAGNOSIS — K589 Irritable bowel syndrome without diarrhea: Secondary | ICD-10-CM | POA: Insufficient documentation

## 2023-12-16 DIAGNOSIS — J309 Allergic rhinitis, unspecified: Secondary | ICD-10-CM | POA: Insufficient documentation

## 2023-12-16 DIAGNOSIS — M519 Unspecified thoracic, thoracolumbar and lumbosacral intervertebral disc disorder: Secondary | ICD-10-CM | POA: Insufficient documentation

## 2023-12-16 DIAGNOSIS — B351 Tinea unguium: Secondary | ICD-10-CM | POA: Diagnosis not present

## 2023-12-16 DIAGNOSIS — M5430 Sciatica, unspecified side: Secondary | ICD-10-CM | POA: Insufficient documentation

## 2023-12-16 DIAGNOSIS — Z7901 Long term (current) use of anticoagulants: Secondary | ICD-10-CM | POA: Insufficient documentation

## 2023-12-16 DIAGNOSIS — R269 Unspecified abnormalities of gait and mobility: Secondary | ICD-10-CM | POA: Insufficient documentation

## 2023-12-16 DIAGNOSIS — M150 Primary generalized (osteo)arthritis: Secondary | ICD-10-CM | POA: Insufficient documentation

## 2023-12-16 DIAGNOSIS — M791 Myalgia, unspecified site: Secondary | ICD-10-CM | POA: Insufficient documentation

## 2023-12-16 DIAGNOSIS — Z951 Presence of aortocoronary bypass graft: Secondary | ICD-10-CM | POA: Insufficient documentation

## 2023-12-16 DIAGNOSIS — F3340 Major depressive disorder, recurrent, in remission, unspecified: Secondary | ICD-10-CM | POA: Insufficient documentation

## 2023-12-16 DIAGNOSIS — I771 Stricture of artery: Secondary | ICD-10-CM | POA: Insufficient documentation

## 2023-12-16 DIAGNOSIS — E113299 Type 2 diabetes mellitus with mild nonproliferative diabetic retinopathy without macular edema, unspecified eye: Secondary | ICD-10-CM | POA: Insufficient documentation

## 2023-12-16 DIAGNOSIS — M79676 Pain in unspecified toe(s): Secondary | ICD-10-CM | POA: Diagnosis not present

## 2023-12-16 NOTE — Progress Notes (Addendum)
°  Subjective:  Patient ID: Andrea Kirk, female    DOB: September 28, 1940,  MRN: [id number]  Andrea Kirk presents to clinic today for at risk foot care. Pt has h/o NIDDM with PAD and painful elongated mycotic toenails 1-5 bilaterally which are tender when wearing enclosed shoe gear. Pain is relieved with periodic professional debridement.  Chief Complaint  Patient presents with   [facility]    RM#15 DFC A1c 5.7  no bs this am. Patient has no concerns at this time.   New problem(s): None.   PCP is System, Provider Not In.  Allergies  Allergen Reactions   Latex Other (See Comments), Rash and Hives    tears skin   Codeine Other (See Comments)    Abnormal behavior   Morphine  And Codeine Other (See Comments)    Affects BP and blood sugar.   Cyclobenzaprine Other (See Comments)    MADE PT SICK- pt unsure if med was cyclobenzaprine or methocarbamol    Jardiance [Empagliflozin]     Review of Systems: Negative except as noted in the HPI.  Objective: No changes noted in today's physical examination. There were no vitals filed for this visit. Andrea Kirk is a pleasant 83 y.o. female in NAD. AAO x 3.  Vascular Examination: CFT <3 seconds b/l. DP pulses faintly palpable b/l. PT pulses palpable b/l. Digital hair absent. Skin temperature gradient warm to warm b/l. No pain with calf compression. No ischemia or gangrene. No cyanosis or clubbing noted b/l. Dependent edema noted b/l LE. Trace edema noted BLE. Evidence of chronic venous insufficiency b/l LE.   Neurological Examination: Sensation grossly intact b/l with 10 gram monofilament. Vibratory sensation intact b/l. Pt has subjective symptoms of neuropathy.  Dermatological Examination: Pedal skin warm and supple b/l. No open wounds. No interdigital macerations. Toenails 1-5 b/l thick, discolored, elongated with subungual debris and pain on dorsal palpation. No corns, calluses, nor porokeratotic lesions.  Musculoskeletal Examination: Muscle strength  5/5 to all lower extremity muscle groups bilaterally. No pain, crepitus or joint limitation noted with ROM b/l LE. HAV with bunion deformity noted b/l LE.Mobility via wheelchair assistance.   Radiographs: None  Assessment/Plan: 1. Pain due to onychomycosis of toenail   2. Diabetic polyneuropathy associated with type 2 diabetes mellitus (HCC)   -Facility to continue fall precautions and pressure precautions. -Continue foot and shoe inspections daily. Monitor blood glucose per PCP/Endocrinologist's recommendations. -Patient to continue soft, supportive shoe gear daily. -Mycotic toenails 1-5 bilaterally were debrided in length and girth with sterile nail nippers and dremel without incident. -Patient/POA to call should there be question/concern in the interim.   Return in about 3 months (around 03/15/2024).  Delon CROME Merlin, DPM      [place] LOCATION: [address], [place] [address]                   Office [phone]   [facility] LOCATION: [address] [place], [place] [address] Office [phone]

## 2023-12-22 ENCOUNTER — Ambulatory Visit (HOSPITAL_COMMUNITY)
Admission: RE | Admit: 2023-12-22 | Discharge: 2023-12-22 | Disposition: A | Discharge status: Home / Self Care | Source: Ambulatory Visit | Attending: Internal Medicine | Admitting: Internal Medicine

## 2023-12-22 ENCOUNTER — Encounter (HOSPITAL_COMMUNITY): Payer: Self-pay | Admitting: Internal Medicine

## 2023-12-22 VITALS — BP 140/82 | HR 97 | Ht 62.0 in | Wt 169.8 lb

## 2023-12-22 DIAGNOSIS — I4819 Other persistent atrial fibrillation: Secondary | ICD-10-CM

## 2023-12-22 DIAGNOSIS — I480 Paroxysmal atrial fibrillation: Secondary | ICD-10-CM | POA: Diagnosis not present

## 2023-12-22 DIAGNOSIS — D6869 Other thrombophilia: Secondary | ICD-10-CM

## 2023-12-22 LAB — BASIC METABOLIC PANEL WITH GFR
BUN/Creatinine Ratio: 16 (ref 12–28)
BUN: 10 mg/dL (ref 8–27)
CO2: 36 mmol/L — ABNORMAL HIGH (ref 20–29)
Calcium: 10 mg/dL (ref 8.7–10.3)
Chloride: 94 mmol/L — ABNORMAL LOW (ref 96–106)
Creatinine, Ser: 0.62 mg/dL (ref 0.57–1.00)
Glucose: 359 mg/dL — ABNORMAL HIGH (ref 70–99)
Potassium: 5 mmol/L (ref 3.5–5.2)
Sodium: 136 mmol/L (ref 134–144)
eGFR: 89 mL/min/1.73 (ref >59)

## 2023-12-22 LAB — CBC
Hematocrit: 31.5 % — ABNORMAL LOW (ref 34.0–46.6)
Hemoglobin: 9.8 g/dL — ABNORMAL LOW (ref 11.1–15.9)
MCH: 32 pg (ref 26.6–33.0)
MCHC: 31.1 g/dL — ABNORMAL LOW (ref 31.5–35.7)
MCV: 103 fL — ABNORMAL HIGH (ref 79–97)
Platelets: 172 x10E3/uL (ref 150–450)
RBC: 3.06 x10E6/uL — ABNORMAL LOW (ref 3.77–5.28)
RDW: 14.6 % (ref 11.7–15.4)
WBC: 4.4 x10E3/uL (ref 3.4–10.8)

## 2023-12-22 NOTE — Progress Notes (Addendum)
 Primary Care Physician: System, Provider Not In Primary Cardiologist: Lonni LITTIE Nanas, MD Electrophysiologist: None     Referring Physician: Device clinic     Andrea Kirk is a 83 y.o. female with a history of HTN, HLD, T2DM, CAD s/p CABG, recurrent CVA, carotid artery disease, CKD 2, GERD, chronic diastolic CHF, depression, and anxiety who presents for consultation in the [facility]. Hospital admission 10/27-31/2025 for acute metabolic encephalopathy due to acute CVA, UTI, shock liver and dehydration/AKI. Discharged on ASA and plavix . S/p ILR placement on 10/31. Device clinic alert on 11/3 for new Afib.   On evaluation today, patient is currently in Afib. She is a temporary rehab facility and is in a lot of pain with her hip s/p fracture repair on 10/15. She is taking lovenox  injections through 11/14.   Follow-up 12/22/2023, patient is currently in A-fib.  She does not have cardiac awareness.  She was seen by Dr. Nanas on 11/18 and noted to have not yet begun anticoagulation.  The cardiologist recommended for patient to stop aspirin  and begin Eliquis  5 mg twice daily.  No bleeding issues on anticoagulation.  Today, she denies symptoms of palpitations, chest pain, shortness of breath, orthopnea, PND, lower extremity edema, dizziness, presyncope, syncope, bleeding, or neurologic sequela. The patient is tolerating medications without difficulties and is otherwise without complaint today.   she has a BMI of Body mass index is 31.05 kg/m.SABRA Filed Weights   12/22/23 1438  Weight: 77 kg    Current Outpatient Medications  Medication Sig Dispense Refill   albuterol  (PROVENTIL ) (2.5 MG/3ML) 0.083% nebulizer solution Inhale 3 mLs (2.5 mg total) into the lungs every 6 (six) hours as needed for wheezing or shortness of breath.     amLODipine  (NORVASC ) 10 MG tablet Take 10 mg by mouth daily.     apixaban  (ELIQUIS ) 5 MG TABS tablet Take 1 tablet (5 mg total)  by mouth 2 (two) times daily. 60 tablet 6   bisacodyl  (DULCOLAX) 10 MG suppository Place 1 suppository (10 mg total) rectally daily as needed for severe constipation.     busPIRone  (BUSPAR ) 10 MG tablet Take 1 tablet (10 mg total) by mouth 2 (two) times daily.     calcium  carbonate (TUMS EX) 750 MG chewable tablet Chew 1 tablet by mouth daily as needed for heartburn.     calcium -vitamin D  (OSCAL WITH D) 500-5 MG-MCG tablet Take 1 tablet by mouth 2 (two) times daily with a meal.     Carboxymethylcellulose Sodium (REFRESH TEARS OP) Place 1 drop into both eyes every 4 (four) hours as needed (for dry eye).     carvedilol  (COREG ) 6.25 MG tablet Take 6.25 mg by mouth 2 (two) times daily with a meal.     citalopram  (CELEXA ) 20 MG tablet Take 20 mg by mouth daily.     docusate sodium  (COLACE) 100 MG capsule Take 100 mg by mouth 2 (two) times daily.     ferrous sulfate  325 (65 FE) MG tablet Take 325 mg by mouth daily. Monday-Friday     furosemide  (LASIX ) 20 MG tablet Take 1 tablet (20 mg total) by mouth daily as needed for fluid or edema. take once daily as needed for weight gain of 2-3 lbs overnight or 5 lbs in 1 week. 90 tablet 3   gabapentin  (NEURONTIN ) 300 MG capsule Take 1 capsule (300 mg total) by mouth 2 (two) times daily.     glipiZIDE  (GLUCOTROL  XL) 5 MG 24 hr  tablet Take 1 tablet (5 mg total) by mouth daily with breakfast. 30 tablet 0   insulin  aspart (NOVOLOG ) 100 UNIT/ML injection Inject 0-15 Units into the skin 3 (three) times daily with meals. 0-15 Units, Subcutaneous, 3 times daily with meals, First dose on Mon 11/03/23 at 1700 Correction coverage: Moderate (average weight, post-op) CBG < 70: Implement Hypoglycemia Standing Orders and refer to Hypoglycemia Standing Orders sidebar report CBG 70 - 120: 0 units CBG 121 - 150: 2 units CBG 151 - 200: 3 units CBG 201 - 250: 5 units CBG 251 - 300: 8 units CBG 301 - 350: 11 units CBG 351 - 400: 15 units     LANTUS  SOLOSTAR 100 UNIT/ML Solostar Pen Inject  into the skin.     Magnesium  Oxide -Mg Supplement 250 MG TABS Take 250 mg by mouth daily.     melatonin 3 MG TABS tablet Take 3 mg by mouth at bedtime.     methocarbamol  (ROBAXIN ) 500 MG tablet Take 500 mg by mouth every 8 (eight) hours as needed for muscle spasms.     mirtazapine (REMERON) 7.5 MG tablet Take 7.5 mg by mouth at bedtime.     oxybutynin  (DITROPAN -XL) 10 MG 24 hr tablet Take 10 mg by mouth daily.     oxyCODONE  (OXY IR/ROXICODONE ) 5 MG immediate release tablet Take 1 tablet (5 mg total) by mouth every 6 (six) hours as needed for severe pain (pain score 7-10). 10 tablet 0   OXYGEN  Inhale 2 L/min into the lungs continuous.     pantoprazole  (PROTONIX ) 40 MG tablet Take 1 tablet (40 mg total) by mouth 2 (two) times daily.     polyethylene glycol (MIRALAX  / GLYCOLAX ) 17 g packet Take 17 g by mouth daily as needed for moderate constipation.     saccharomyces boulardii (FLORASTOR) 250 MG capsule Take 250 mg by mouth daily.     senna (SENOKOT) 8.6 MG tablet Take 2 tablets by mouth at bedtime.     No current facility-administered medications for this encounter.    Atrial Fibrillation Management history:  Previous antiarrhythmic drugs: none Previous cardioversions: none Previous ablations: none Anticoagulation history: Eliquis    ROS- All systems are reviewed and negative except as per the HPI above.  Physical Exam: BP (!) 140/82   Pulse 97   Ht 5' 2 (1.575 m)   Wt 77 kg   BMI 31.05 kg/m   GEN- The patient is well appearing, alert and oriented x 3 today.   Neck - no JVD or carotid bruit noted Lungs- Clear to ausculation bilaterally, normal work of breathing Heart- Irregular rate and rhythm, no murmurs, rubs or gallops, PMI not laterally displaced Extremities- no clubbing, cyanosis, or edema Skin - no rash or ecchymosis noted   EKG today demonstrates  EKG Interpretation Date/Time:  Monday December 22 2023 14:42:25 EST Ventricular Rate:  97 PR Interval:    QRS  Duration:  96 QT Interval:  394 QTC Calculation: 500 R Axis:   17  Text Interpretation: Atrial fibrillation Incomplete right bundle branch block Minimal voltage criteria for LVH, may be normal variant ( R in aVL ) Cannot rule out Anterior infarct , age undetermined Abnormal ECG When compared with ECG of 25-Nov-2023 12:05, PREVIOUS ECG IS PRESENT Confirmed by Terra Pac ([id number]) on 12/22/2023 2:45:00 PM    Echo 10/22/23 demonstrated   1. Left ventricular ejection fraction, by estimation, is >75%. The left  ventricle has hyperdynamic function. The left ventricle has no regional  wall  motion abnormalities. Left ventricular diastolic parameters are  consistent with Grade II diastolic  dysfunction (pseudonormalization).   2. Right ventricular systolic function is normal. The right ventricular  size is normal. There is severely elevated pulmonary artery systolic  pressure.   3. Left atrial size was moderately dilated.   4. Right atrial size was mildly dilated.   5. The mitral valve is normal in structure. Trivial mitral valve  regurgitation. No evidence of mitral stenosis.   6. The aortic valve is tricuspid. Aortic valve regurgitation is not  visualized. Mild aortic valve stenosis.   7. Aortic dilatation noted. There is borderline dilatation of the  ascending aorta, measuring 39 mm.   8. The inferior vena cava is normal in size with greater than 50%  respiratory variability, suggesting right atrial pressure of 3 mmHg.   ASSESSMENT & PLAN CHA2DS2-VASc Score = 9  The patient's score is based upon: CHF History: 1 HTN History: 1 Diabetes History: 1 Stroke History: 2 Vascular Disease History: 1 Age Score: 2 Gender Score: 1       ASSESSMENT AND PLAN: Persistent Atrial Fibrillation (ICD10:  I48.0) The patient's CHA2DS2-VASc score is 9, indicating a 12.2% annual risk of stroke.    Patient is currently in A-fib.  Continue Coreg  6.25 mg twice daily.  I spoke with patient and her brother  Dr. Marks over the phone.  We discussed cardioversion as a procedure to try to restore sinus rhythm and we also discussed rate control strategy.  Patient does not appear to have cardiac awareness.  After discussion with patient and brother, I have elected to continue with a rate control strategy and not perform cardioversion at this time.  She is in a long-term care facility and will record her heart rate twice a day.  If she notes significant tachycardia and/or symptoms, we can then consider cardioversion.  For now, they declined cardioversion.  Secondary Hypercoagulable State (ICD10:  D68.69) The patient is at significant risk for stroke/thromboembolism based upon her CHA2DS2-VASc Score of 9.  Continue Apixaban  (Eliquis ).  Patient began Eliquis  5 mg twice daily after office visit with Dr. Kate on 11/18.  No bleeding issues on anticoagulation.  Continue Eliquis  5 mg twice daily without interruption. Will check Bmet and CBC today to confirm correct dosage / reassess Hgb and PLT.      Follow up 3 months cardiology, 6 months A-fib clinic.   Terra Pac, [facility]  Afib Clinic [address] [place], [place] [address] [phone]

## 2023-12-22 NOTE — Patient Instructions (Signed)
 Continue use pulse ox to monitor

## 2023-12-23 ENCOUNTER — Ambulatory Visit (HOSPITAL_COMMUNITY): Payer: Self-pay | Admitting: Internal Medicine

## 2023-12-23 ENCOUNTER — Inpatient Hospital Stay: Admitting: Adult Health

## 2023-12-31 ENCOUNTER — Ambulatory Visit: Admitting: Gastroenterology

## 2024-01-06 ENCOUNTER — Encounter (HOSPITAL_COMMUNITY): Payer: Self-pay

## 2024-01-06 ENCOUNTER — Inpatient Hospital Stay (HOSPITAL_COMMUNITY)
Admission: EM | Admit: 2024-01-06 | Discharge: 2024-01-12 | DRG: 291 | Disposition: A | Discharge status: Skilled Nursing Facility | Source: Skilled Nursing Facility | Attending: Internal Medicine | Admitting: Internal Medicine

## 2024-01-06 ENCOUNTER — Ambulatory Visit: Admitting: Gastroenterology

## 2024-01-06 ENCOUNTER — Emergency Department (HOSPITAL_COMMUNITY)

## 2024-01-06 ENCOUNTER — Other Ambulatory Visit: Payer: Self-pay

## 2024-01-06 DIAGNOSIS — L8990 Pressure ulcer of unspecified site, unspecified stage: Secondary | ICD-10-CM | POA: Insufficient documentation

## 2024-01-06 DIAGNOSIS — R7989 Other specified abnormal findings of blood chemistry: Secondary | ICD-10-CM | POA: Diagnosis present

## 2024-01-06 DIAGNOSIS — E785 Hyperlipidemia, unspecified: Secondary | ICD-10-CM | POA: Diagnosis present

## 2024-01-06 DIAGNOSIS — J9621 Acute and chronic respiratory failure with hypoxia: Secondary | ICD-10-CM | POA: Diagnosis present

## 2024-01-06 DIAGNOSIS — K219 Gastro-esophageal reflux disease without esophagitis: Secondary | ICD-10-CM | POA: Diagnosis present

## 2024-01-06 DIAGNOSIS — G2581 Restless legs syndrome: Secondary | ICD-10-CM | POA: Diagnosis present

## 2024-01-06 DIAGNOSIS — I6521 Occlusion and stenosis of right carotid artery: Secondary | ICD-10-CM | POA: Diagnosis present

## 2024-01-06 DIAGNOSIS — J9601 Acute respiratory failure with hypoxia: Principal | ICD-10-CM

## 2024-01-06 DIAGNOSIS — F419 Anxiety disorder, unspecified: Secondary | ICD-10-CM | POA: Diagnosis present

## 2024-01-06 DIAGNOSIS — I5033 Acute on chronic diastolic (congestive) heart failure: Secondary | ICD-10-CM | POA: Diagnosis present

## 2024-01-06 DIAGNOSIS — I5031 Acute diastolic (congestive) heart failure: Secondary | ICD-10-CM | POA: Diagnosis not present

## 2024-01-06 DIAGNOSIS — Z7901 Long term (current) use of anticoagulants: Secondary | ICD-10-CM

## 2024-01-06 DIAGNOSIS — F32A Depression, unspecified: Secondary | ICD-10-CM | POA: Diagnosis present

## 2024-01-06 DIAGNOSIS — Z8701 Personal history of pneumonia (recurrent): Secondary | ICD-10-CM

## 2024-01-06 DIAGNOSIS — G4730 Sleep apnea, unspecified: Secondary | ICD-10-CM | POA: Diagnosis present

## 2024-01-06 DIAGNOSIS — Z91040 Latex allergy status: Secondary | ICD-10-CM

## 2024-01-06 DIAGNOSIS — Z83438 Family history of other disorder of lipoprotein metabolism and other lipidemia: Secondary | ICD-10-CM

## 2024-01-06 DIAGNOSIS — Z951 Presence of aortocoronary bypass graft: Secondary | ICD-10-CM | POA: Diagnosis not present

## 2024-01-06 DIAGNOSIS — Z888 Allergy status to other drugs, medicaments and biological substances status: Secondary | ICD-10-CM

## 2024-01-06 DIAGNOSIS — Z8781 Personal history of (healed) traumatic fracture: Secondary | ICD-10-CM

## 2024-01-06 DIAGNOSIS — E1140 Type 2 diabetes mellitus with diabetic neuropathy, unspecified: Secondary | ICD-10-CM | POA: Diagnosis present

## 2024-01-06 DIAGNOSIS — Z87440 Personal history of urinary (tract) infections: Secondary | ICD-10-CM

## 2024-01-06 DIAGNOSIS — G629 Polyneuropathy, unspecified: Secondary | ICD-10-CM

## 2024-01-06 DIAGNOSIS — Z9981 Dependence on supplemental oxygen: Secondary | ICD-10-CM

## 2024-01-06 DIAGNOSIS — Z947 Corneal transplant status: Secondary | ICD-10-CM

## 2024-01-06 DIAGNOSIS — Z79899 Other long term (current) drug therapy: Secondary | ICD-10-CM

## 2024-01-06 DIAGNOSIS — Z7401 Bed confinement status: Secondary | ICD-10-CM

## 2024-01-06 DIAGNOSIS — J9622 Acute and chronic respiratory failure with hypercapnia: Secondary | ICD-10-CM | POA: Diagnosis present

## 2024-01-06 DIAGNOSIS — E782 Mixed hyperlipidemia: Secondary | ICD-10-CM | POA: Diagnosis present

## 2024-01-06 DIAGNOSIS — I110 Hypertensive heart disease with heart failure: Principal | ICD-10-CM | POA: Diagnosis present

## 2024-01-06 DIAGNOSIS — R531 Weakness: Secondary | ICD-10-CM | POA: Diagnosis present

## 2024-01-06 DIAGNOSIS — E1165 Type 2 diabetes mellitus with hyperglycemia: Secondary | ICD-10-CM | POA: Diagnosis present

## 2024-01-06 DIAGNOSIS — I2510 Atherosclerotic heart disease of native coronary artery without angina pectoris: Secondary | ICD-10-CM | POA: Diagnosis present

## 2024-01-06 DIAGNOSIS — I4819 Other persistent atrial fibrillation: Secondary | ICD-10-CM | POA: Diagnosis present

## 2024-01-06 DIAGNOSIS — Z8673 Personal history of transient ischemic attack (TIA), and cerebral infarction without residual deficits: Secondary | ICD-10-CM

## 2024-01-06 DIAGNOSIS — L89156 Pressure-induced deep tissue damage of sacral region: Secondary | ICD-10-CM | POA: Diagnosis present

## 2024-01-06 DIAGNOSIS — Z885 Allergy status to narcotic agent status: Secondary | ICD-10-CM

## 2024-01-06 DIAGNOSIS — M25551 Pain in right hip: Secondary | ICD-10-CM | POA: Diagnosis present

## 2024-01-06 DIAGNOSIS — R0602 Shortness of breath: Secondary | ICD-10-CM | POA: Diagnosis present

## 2024-01-06 DIAGNOSIS — J810 Acute pulmonary edema: Secondary | ICD-10-CM

## 2024-01-06 DIAGNOSIS — D649 Anemia, unspecified: Secondary | ICD-10-CM | POA: Diagnosis present

## 2024-01-06 DIAGNOSIS — Z8249 Family history of ischemic heart disease and other diseases of the circulatory system: Secondary | ICD-10-CM

## 2024-01-06 DIAGNOSIS — I10 Essential (primary) hypertension: Secondary | ICD-10-CM | POA: Diagnosis present

## 2024-01-06 DIAGNOSIS — Z794 Long term (current) use of insulin: Secondary | ICD-10-CM

## 2024-01-06 DIAGNOSIS — Z7984 Long term (current) use of oral hypoglycemic drugs: Secondary | ICD-10-CM

## 2024-01-06 LAB — COMPREHENSIVE METABOLIC PANEL WITH GFR
ALT: 10 U/L (ref 0–44)
AST: 30 U/L (ref 15–41)
Albumin: 3.8 g/dL (ref 3.5–5.0)
Alkaline Phosphatase: 125 U/L (ref 38–126)
Anion gap: 8 (ref 5–15)
BUN: 13 mg/dL (ref 8–23)
CO2: 35 mmol/L — ABNORMAL HIGH (ref 22–32)
Calcium: 10 mg/dL (ref 8.9–10.3)
Chloride: 94 mmol/L — ABNORMAL LOW (ref 98–111)
Creatinine, Ser: 1.09 mg/dL — ABNORMAL HIGH (ref 0.44–1.00)
GFR, Estimated: 50 mL/min — ABNORMAL LOW
Glucose, Bld: 294 mg/dL — ABNORMAL HIGH (ref 70–99)
Potassium: 4.9 mmol/L (ref 3.5–5.1)
Sodium: 137 mmol/L (ref 135–145)
Total Bilirubin: 1 mg/dL (ref 0.0–1.2)
Total Protein: 7.6 g/dL (ref 6.5–8.1)

## 2024-01-06 LAB — CBC WITH DIFFERENTIAL/PLATELET
Abs Immature Granulocytes: 0.08 K/uL — ABNORMAL HIGH (ref 0.00–0.07)
Basophils Absolute: 0.1 K/uL (ref 0.0–0.1)
Basophils Relative: 1 %
Eosinophils Absolute: 0.1 K/uL (ref 0.0–0.5)
Eosinophils Relative: 1 %
HCT: 30 % — ABNORMAL LOW (ref 36.0–46.0)
Hemoglobin: 9.2 g/dL — ABNORMAL LOW (ref 12.0–15.0)
Immature Granulocytes: 1 %
Lymphocytes Relative: 10 %
Lymphs Abs: 1.1 K/uL (ref 0.7–4.0)
MCH: 32.6 pg (ref 26.0–34.0)
MCHC: 30.7 g/dL (ref 30.0–36.0)
MCV: 106.4 fL — ABNORMAL HIGH (ref 80.0–100.0)
Monocytes Absolute: 0.9 K/uL (ref 0.1–1.0)
Monocytes Relative: 9 %
Neutro Abs: 8.5 K/uL — ABNORMAL HIGH (ref 1.7–7.7)
Neutrophils Relative %: 78 %
Platelets: 192 K/uL (ref 150–400)
RBC: 2.82 MIL/uL — ABNORMAL LOW (ref 3.87–5.11)
RDW: 13.8 % (ref 11.5–15.5)
WBC: 10.7 K/uL — ABNORMAL HIGH (ref 4.0–10.5)
nRBC: 0 % (ref 0.0–0.2)

## 2024-01-06 LAB — RESP PANEL BY RT-PCR (RSV, FLU A&B, COVID)  RVPGX2
Influenza A by PCR: NEGATIVE
Influenza B by PCR: NEGATIVE
Resp Syncytial Virus by PCR: NEGATIVE
SARS Coronavirus 2 by RT PCR: NEGATIVE

## 2024-01-06 LAB — TROPONIN T, HIGH SENSITIVITY
Troponin T High Sensitivity: 25 ng/L — ABNORMAL HIGH (ref 0–19)
Troponin T High Sensitivity: 27 ng/L — ABNORMAL HIGH (ref 0–19)

## 2024-01-06 LAB — MRSA NEXT GEN BY PCR, NASAL: MRSA by PCR Next Gen: NOT DETECTED

## 2024-01-06 LAB — CBG MONITORING, ED: Glucose-Capillary: 249 mg/dL — ABNORMAL HIGH (ref 70–99)

## 2024-01-06 LAB — PRO BRAIN NATRIURETIC PEPTIDE: Pro Brain Natriuretic Peptide: 1832 pg/mL — ABNORMAL HIGH

## 2024-01-06 MED ORDER — APIXABAN 5 MG PO TABS
5.0000 mg | ORAL_TABLET | Freq: Two times a day (BID) | ORAL | Status: DC
  Administered 2024-01-06 – 2024-01-12 (×11): 5 mg via ORAL
  Filled 2024-01-06 (×12): qty 1

## 2024-01-06 MED ORDER — MELATONIN 3 MG PO TABS
3.0000 mg | ORAL_TABLET | Freq: Every day | ORAL | Status: DC
  Administered 2024-01-06 – 2024-01-11 (×6): 3 mg via ORAL
  Filled 2024-01-06 (×6): qty 1

## 2024-01-06 MED ORDER — ONDANSETRON HCL 4 MG/2ML IJ SOLN: 4.0000 mg | Freq: Four times a day (QID) | INTRAMUSCULAR | Status: DC | PRN

## 2024-01-06 MED ORDER — POLYVINYL ALCOHOL 1.4 % OP SOLN: 1.0000 [drp] | OPHTHALMIC | Status: DC | PRN

## 2024-01-06 MED ORDER — PANTOPRAZOLE SODIUM 40 MG PO TBEC
40.0000 mg | DELAYED_RELEASE_TABLET | Freq: Two times a day (BID) | ORAL | Status: DC
  Administered 2024-01-06 – 2024-01-12 (×12): 40 mg via ORAL
  Filled 2024-01-06 (×12): qty 1

## 2024-01-06 MED ORDER — BUSPIRONE HCL 5 MG PO TABS
10.0000 mg | ORAL_TABLET | Freq: Two times a day (BID) | ORAL | Status: DC
  Administered 2024-01-06 – 2024-01-12 (×11): 10 mg via ORAL
  Filled 2024-01-06 (×9): qty 2
  Filled 2024-01-06: qty 1
  Filled 2024-01-06: qty 2

## 2024-01-06 MED ORDER — SENNA 8.6 MG PO TABS
2.0000 | ORAL_TABLET | Freq: Every day | ORAL | Status: DC
  Administered 2024-01-06 – 2024-01-11 (×5): 17.2 mg via ORAL
  Filled 2024-01-06 (×5): qty 2

## 2024-01-06 MED ORDER — OXYCODONE HCL 5 MG PO TABS
5.0000 mg | ORAL_TABLET | Freq: Four times a day (QID) | ORAL | Status: DC | PRN
  Administered 2024-01-06 – 2024-01-12 (×11): 5 mg via ORAL
  Filled 2024-01-06 (×11): qty 1

## 2024-01-06 MED ORDER — MAGNESIUM OXIDE -MG SUPPLEMENT 400 (240 MG) MG PO TABS
400.0000 mg | ORAL_TABLET | Freq: Every day | ORAL | Status: DC
  Administered 2024-01-07 – 2024-01-12 (×6): 400 mg via ORAL
  Filled 2024-01-06 (×6): qty 1

## 2024-01-06 MED ORDER — GABAPENTIN 300 MG PO CAPS
300.0000 mg | ORAL_CAPSULE | Freq: Two times a day (BID) | ORAL | Status: DC
  Administered 2024-01-06 – 2024-01-12 (×12): 300 mg via ORAL
  Filled 2024-01-06 (×12): qty 1

## 2024-01-06 MED ORDER — VITAMIN B-12 1000 MCG PO TABS
1000.0000 ug | ORAL_TABLET | Freq: Every day | ORAL | Status: DC
  Administered 2024-01-07 – 2024-01-12 (×6): 1000 ug via ORAL
  Filled 2024-01-06 (×6): qty 1

## 2024-01-06 MED ORDER — ACETAMINOPHEN 325 MG PO TABS
650.0000 mg | ORAL_TABLET | Freq: Four times a day (QID) | ORAL | Status: DC | PRN
  Administered 2024-01-11 (×2): 650 mg via ORAL
  Filled 2024-01-06 (×3): qty 2

## 2024-01-06 MED ORDER — FUROSEMIDE 10 MG/ML IJ SOLN
60.0000 mg | Freq: Once | INTRAMUSCULAR | Status: AC
  Administered 2024-01-06: 60 mg via INTRAVENOUS
  Filled 2024-01-06: qty 8

## 2024-01-06 MED ORDER — MIRTAZAPINE 15 MG PO TABS
7.5000 mg | ORAL_TABLET | Freq: Every day | ORAL | Status: DC
  Administered 2024-01-06 – 2024-01-11 (×6): 7.5 mg via ORAL
  Filled 2024-01-06 (×6): qty 1

## 2024-01-06 MED ORDER — ONDANSETRON HCL 4 MG PO TABS: 4.0000 mg | ORAL_TABLET | Freq: Four times a day (QID) | ORAL | Status: DC | PRN

## 2024-01-06 MED ORDER — DOCUSATE SODIUM 100 MG PO CAPS
100.0000 mg | ORAL_CAPSULE | Freq: Every day | ORAL | Status: DC
  Administered 2024-01-06 – 2024-01-11 (×6): 100 mg via ORAL
  Filled 2024-01-06 (×6): qty 1

## 2024-01-06 MED ORDER — FUROSEMIDE 10 MG/ML IJ SOLN: 20.0000 mg | Freq: Two times a day (BID) | INTRAMUSCULAR | Status: DC

## 2024-01-06 MED ORDER — CITALOPRAM HYDROBROMIDE 20 MG PO TABS
20.0000 mg | ORAL_TABLET | Freq: Every day | ORAL | Status: DC
  Administered 2024-01-07 – 2024-01-12 (×6): 20 mg via ORAL
  Filled 2024-01-06: qty 1
  Filled 2024-01-06: qty 2
  Filled 2024-01-06 (×4): qty 1

## 2024-01-06 MED ORDER — FENTANYL CITRATE (PF) 50 MCG/ML IJ SOSY
50.0000 ug | PREFILLED_SYRINGE | Freq: Once | INTRAMUSCULAR | Status: AC
  Administered 2024-01-07: 50 ug via INTRAVENOUS
  Filled 2024-01-06 (×2): qty 1

## 2024-01-06 MED ORDER — ONDANSETRON HCL 4 MG/2ML IJ SOLN
4.0000 mg | Freq: Once | INTRAMUSCULAR | Status: DC
  Filled 2024-01-06: qty 2

## 2024-01-06 MED ORDER — ACETAMINOPHEN 650 MG RE SUPP: 650.0000 mg | Freq: Four times a day (QID) | RECTAL | Status: DC | PRN

## 2024-01-06 MED ORDER — CARBOXYMETHYLCELLULOSE SODIUM 0.5 % OP SOLN: 1.0000 [drp] | OPHTHALMIC | Status: DC | PRN

## 2024-01-06 MED ORDER — SACCHAROMYCES BOULARDII 250 MG PO CAPS
250.0000 mg | ORAL_CAPSULE | Freq: Every day | ORAL | Status: DC
  Administered 2024-01-08 – 2024-01-12 (×5): 250 mg via ORAL
  Filled 2024-01-06 (×6): qty 1

## 2024-01-06 MED ORDER — INSULIN GLARGINE-YFGN 100 UNIT/ML ~~LOC~~ SOLN
5.0000 [IU] | Freq: Every day | SUBCUTANEOUS | Status: DC
  Administered 2024-01-06 – 2024-01-10 (×5): 5 [IU] via SUBCUTANEOUS
  Filled 2024-01-06 (×6): qty 0.05

## 2024-01-06 MED ORDER — CARVEDILOL 6.25 MG PO TABS
6.2500 mg | ORAL_TABLET | Freq: Two times a day (BID) | ORAL | Status: DC
  Administered 2024-01-06 – 2024-01-12 (×12): 6.25 mg via ORAL
  Filled 2024-01-06 (×2): qty 1
  Filled 2024-01-06 (×2): qty 2
  Filled 2024-01-06 (×4): qty 1
  Filled 2024-01-06: qty 2
  Filled 2024-01-06 (×3): qty 1

## 2024-01-06 MED ORDER — FUROSEMIDE 10 MG/ML IJ SOLN
20.0000 mg | Freq: Two times a day (BID) | INTRAMUSCULAR | Status: DC
  Administered 2024-01-07 – 2024-01-11 (×9): 20 mg via INTRAVENOUS
  Filled 2024-01-06 (×4): qty 2
  Filled 2024-01-06: qty 4
  Filled 2024-01-06: qty 2
  Filled 2024-01-06: qty 4
  Filled 2024-01-06 (×2): qty 2

## 2024-01-06 MED ORDER — POLYETHYLENE GLYCOL 3350 17 G PO PACK
17.0000 g | PACK | Freq: Every day | ORAL | Status: DC | PRN
  Filled 2024-01-06: qty 1

## 2024-01-06 MED ORDER — OXYBUTYNIN CHLORIDE ER 5 MG PO TB24
10.0000 mg | ORAL_TABLET | Freq: Every day | ORAL | Status: DC
  Administered 2024-01-08 – 2024-01-12 (×5): 10 mg via ORAL
  Filled 2024-01-06: qty 1
  Filled 2024-01-06 (×5): qty 2

## 2024-01-06 MED ORDER — CHLORHEXIDINE GLUCONATE CLOTH 2 % EX PADS
6.0000 | MEDICATED_PAD | Freq: Every day | CUTANEOUS | Status: DC
  Administered 2024-01-07: 6 via TOPICAL

## 2024-01-06 MED ORDER — INSULIN ASPART 100 UNIT/ML IJ SOLN
0.0000 [IU] | Freq: Three times a day (TID) | INTRAMUSCULAR | Status: DC
  Administered 2024-01-07: 5 [IU] via SUBCUTANEOUS
  Administered 2024-01-07: 3 [IU] via SUBCUTANEOUS
  Administered 2024-01-08: 5 [IU] via SUBCUTANEOUS
  Administered 2024-01-08: 3 [IU] via SUBCUTANEOUS
  Administered 2024-01-08 – 2024-01-09 (×4): 5 [IU] via SUBCUTANEOUS
  Administered 2024-01-10: 11 [IU] via SUBCUTANEOUS
  Administered 2024-01-10 (×2): 5 [IU] via SUBCUTANEOUS
  Administered 2024-01-11: 8 [IU] via SUBCUTANEOUS
  Administered 2024-01-11: 3 [IU] via SUBCUTANEOUS
  Administered 2024-01-11: 8 [IU] via SUBCUTANEOUS
  Administered 2024-01-12: 11 [IU] via SUBCUTANEOUS
  Administered 2024-01-12: 15 [IU] via SUBCUTANEOUS
  Filled 2024-01-06: qty 11
  Filled 2024-01-06 (×3): qty 5
  Filled 2024-01-06: qty 3
  Filled 2024-01-06: qty 5
  Filled 2024-01-06: qty 8
  Filled 2024-01-06: qty 5
  Filled 2024-01-06: qty 3
  Filled 2024-01-06: qty 8
  Filled 2024-01-06: qty 15
  Filled 2024-01-06: qty 11
  Filled 2024-01-06 (×4): qty 5

## 2024-01-06 NOTE — ED Provider Notes (Signed)
 "  Emergency Department Provider Note   I have reviewed the triage vital signs and the nursing notes.   HISTORY  Chief Complaint Shortness of Breath   HPI Andrea Kirk is a 83 y.o. female with PMH of CVA, A fib on Eliquis , CAD, CHF, and HTN presents to the ED from [facility] where she is recovering after hip rupture repair on 10/15 (Dr. Reyne) and subsequent hospitalization on 10/31 for encephalopathy 2/2 UTI. She was noted by staff to have hypoxemia to the 50s on RA and SOB symptoms. Patient denies pain. Staff report to EMS that patient is mostly bed bound. Patient reports a mild cough. Denies CP, abdominal pain, or vomiting. She does complain of pain in the right hip since surgery.    Past Medical History:  Diagnosis Date   Acute kidney injury 08/05/2016   Acute respiratory failure with hypoxia (HCC) 04/19/2020   Acute stroke due to ischemia [facility])    Aftercare following surgery of the circulatory system, NEC 05/11/2013   AKI (acute kidney injury) 08/04/2016   Anxiety    takes Celexa  daily   ARF (acute renal failure) 11/07/2016   Asymptomatic stenosis of right carotid artery 03/22/2016   Back pain    occasionally   Carotid artery disease 04/16/2013   Carotid artery occlusion    Carotid stenosis 11/16/2013   Cataract    left and immature   Coronary artery disease    Coronary atherosclerosis of native coronary artery 03/11/2013   S/p CABG in 1997    Depression    Diabetes mellitus    takes Metformin  and Glipizide  daily   Diabetes mellitus (HCC) 05/02/2015   Dizziness    takes Meclizine  daily as needed   Essential hypertension, benign 03/11/2013   GERD (gastroesophageal reflux disease)    takes Omeprazole daily as needed   Headache(784.0)    Hyperlipidemia    takes Atorvastatin  daily   Hypertension    takes Carvedilol  daily   Mixed hyperlipidemia 03/11/2013   Muscle spasm    takes Robaxin  daily as needed   Nausea    takes Phenergan  daily as needed    Occlusion and stenosis of carotid artery without mention of cerebral infarction 07/19/2011   Pneumonia    hx of-in high school   Restless leg    takes Requip  daily as needed   Seasonal allergies    takes Claritin  daily as needed and Afrin as needed   Severe sepsis (HCC) 02/14/2021   Shortness of breath    with exertion   Urinary urgency    UTI (urinary tract infection) 11/07/2016    Review of Systems  Constitutional: No fever/chills Cardiovascular: Denies chest pain. Respiratory: Positive shortness of breath and cough.  Gastrointestinal: No abdominal pain.  No nausea, no vomiting.   Neurological: Negative for headaches.  ____________________________________________   PHYSICAL EXAM:  VITAL SIGNS: ED Triage Vitals  Encounter Vitals Group     BP 01/06/24 0323 115/79     Pulse Rate 01/06/24 0323 94     Resp 01/06/24 0323 18     Temp 01/06/24 0323 (!) 97.5 F (36.4 C)     Temp Source 01/06/24 0323 Oral     SpO2 01/06/24 0323 90 %   Constitutional: Alert and oriented. Well appearing and in no acute distress. Eyes: Conjunctivae are normal.  Head: Atraumatic. Nose: No congestion/rhinnorhea. Mouth/Throat: Mucous membranes are moist.   Neck: No stridor. Cardiovascular: Normal rate, regular rhythm. Good peripheral circulation. Grossly normal heart sounds.  Respiratory: Normal respiratory effort.  No retractions. Lungs with crackles at the bases. Gastrointestinal: Soft and nontender. No distention.  Musculoskeletal: No gross deformities of extremities. Neurologic:  Normal speech and language.  Skin:  Skin is warm, dry and intact. No rash noted.  ____________________________________________   LABS (all labs ordered are listed, but only abnormal results are displayed)  Labs Reviewed  PRO BRAIN NATRIURETIC PEPTIDE - Abnormal; Notable for the following components:      Result Value   Pro Brain Natriuretic Peptide 1,832.0 (*)    All other components within normal limits   COMPREHENSIVE METABOLIC PANEL WITH GFR - Abnormal; Notable for the following components:   Chloride 94 (*)    CO2 35 (*)    Glucose, Bld 294 (*)    Creatinine, Ser 1.09 (*)    GFR, Estimated 50 (*)    All other components within normal limits  CBC WITH DIFFERENTIAL/PLATELET - Abnormal; Notable for the following components:   WBC 10.7 (*)    RBC 2.82 (*)    Hemoglobin 9.2 (*)    HCT 30.0 (*)    MCV 106.4 (*)    Neutro Abs 8.5 (*)    Abs Immature Granulocytes 0.08 (*)    All other components within normal limits  TROPONIN T, HIGH SENSITIVITY - Abnormal; Notable for the following components:   Troponin T High Sensitivity 25 (*)    All other components within normal limits  RESP PANEL BY RT-PCR (RSV, FLU A&B, COVID)  [id number]  TROPONIN T, HIGH SENSITIVITY   ____________________________________________  EKG   EKG Interpretation Date/Time:  Tuesday January 06 2024 03:26:19 EST Ventricular Rate:  109 PR Interval:    QRS Duration:  110 QT Interval:  363 QTC Calculation: 489 R Axis:   89  Text Interpretation: Atrial fibrillation Borderline right axis deviation Borderline T abnormalities, anterior leads Borderline prolonged QT interval Confirmed by Darra Chew [id number]) on 01/06/2024 3:36:07 AM        ____________________________________________  RADIOLOGY  DG Chest Portable 1 View Result Date: 01/06/2024 EXAM: 1 VIEW XRAY OF THE CHEST 01/06/2024 03:50:00 AM COMPARISON: 11/03/2023 CLINICAL HISTORY: SOB FINDINGS: LUNGS AND PLEURA: Small to moderate bilateral pleural effusions. Perihilar airspace opacities. Mild pulmonary edema. No pneumothorax. HEART AND MEDIASTINUM: Heart size at upper limits. Aortic atherosclerosis. CABG markers noted. BONES AND SOFT TISSUES: Sternotomy wires noted. IMPRESSION: 1. Mild pulmonary edema and small to moderate bilateral pleural effusions. Electronically signed by: Waddell Calk MD 01/06/2024 05:06 AM EST RP Workstation: [id number]     ____________________________________________   PROCEDURES  Procedure(s) performed:   Procedures  CRITICAL CARE Performed by: Chew KANDICE Darra Total critical care time: 35 minutes Critical care time was exclusive of separately billable procedures and treating other patients. Critical care was necessary to treat or prevent imminent or life-threatening deterioration. Critical care was time spent personally by me on the following activities: development of treatment plan with patient and/or surrogate as well as nursing, discussions with consultants, evaluation of patient's response to treatment, examination of patient, obtaining history from patient or surrogate, ordering and performing treatments and interventions, ordering and review of laboratory studies, ordering and review of radiographic studies, pulse oximetry and re-evaluation of patient's condition.  Chew Darra, MD Emergency Medicine  ____________________________________________   INITIAL IMPRESSION / ASSESSMENT AND PLAN / ED COURSE  Pertinent labs & imaging results that were available during my care of the patient were reviewed by me and considered in my medical decision making (see chart for details).  This patient is Presenting for Evaluation of SOB, which does require a range of treatment options, and is a complaint that involves a high risk of morbidity and mortality.  The Differential Diagnoses include CHF, CAP, Flu, COVID, PE, etc.  Critical Interventions-    Medications  fentaNYL  (SUBLIMAZE ) injection 50 mcg (0 mcg Intravenous Hold 01/06/24 0358)  ondansetron  (ZOFRAN ) injection 4 mg (0 mg Intravenous Hold 01/06/24 0358)  furosemide  (LASIX ) injection 60 mg (60 mg Intravenous Given 01/06/24 0538)    Reassessment after intervention:  pain improved.    I did obtain Additional Historical Information from EMS.   I decided to review pertinent External Data, and in summary patient with known history of A fib  followed by the A fib clinic and anticoagulated since mid December.    Clinical Laboratory Tests Ordered, included CBC with mild leukocytosis. Mild anemia at 9.2. No AKI. BNP > 1800. COVID/Flu negative. Minimally elevated troponin.   Radiologic Tests Ordered, included CXR. I independently interpreted the images and agree with radiology interpretation.   Cardiac Monitor Tracing which shows A fib.   Social Determinants of Health Risk patient is not an active smoker.   Consult complete with [facility], Dr. Shona. Plan for admit.   Medical Decision Making: Summary:  Patient with hypoxemia and SOB. Looks well now on 4 L [place]. Speaking in full sentences and mainly complaining of right hip pain in the post-op setting although surgery was mid October. The is anticoagulated so PE less likely but given degree of hypoxemia will consider CTA PE scan pending initial labs and CXR.   Reevaluation with update and discussion with patient. She is on simple facemask with mouth breathing while sleeping. No increased WOB on my reassessment. Considered BiPAP but patient comfortable and saturating well on current setup.   Patient's presentation is most consistent with acute presentation with potential threat to life or bodily function.   Disposition: admit  ____________________________________________  FINAL CLINICAL IMPRESSION(S) / ED DIAGNOSES  Final diagnoses:  Acute respiratory failure with hypoxia (HCC)  Acute pulmonary edema (HCC)    Note:  This document was prepared using Dragon voice recognition software and may include unintentional dictation errors.  Fonda Law, MD, [facility] Emergency Medicine    Cierria Height, Fonda MATSU, MD 01/06/24 [id number]  "

## 2024-01-06 NOTE — Progress Notes (Signed)
 Heart Failure Navigator Progress Note  Assessed for Heart & Vascular TOC clinic readiness.  Patient does not meet criteria due to EF of 65-70%, has a scheduled [facility] appointment on 03/02/2024. No HF TOC. .   Navigator will sign off at this time.   Stephane Haddock, BSN, Scientist, Clinical (histocompatibility And Immunogenetics) Only

## 2024-01-06 NOTE — H&P (Signed)
 " History and Physical    Patient: Andrea Kirk FMW:[id number] DOB: Jul 02, 1940 DOA: 01/06/2024 DOS: the patient was seen and examined on 01/06/2024 PCP: System, Provider Not In  Patient coming from: Home  Chief Complaint:  Chief Complaint  Patient presents with   Shortness of Breath   HPI: Andrea Kirk is a 83 y.o. female with medical history significant of acute kidney injury, acute respiratory failure with hypoxia, history ischemic stroke, anxiety, depression, back pain, carotid artery disease, coronary artery occlusion, left cataract, CAD, CABG, type 2 diabetes, hypertension, GERD, headaches, hyperlipidemia, hypertension, mixed hyperlipidemia, remote history of pneumonia, restless leg syndrome, seasonal allergies, history of severe sepsis, UTI who presented to the emergency department with complaints of dyspnea and hypoxia.  She recently had right hip surgery, has been known weightbearing and bedbound.  EMS reported an O2 saturation in the 50s when they arrived to assess the patient.  This increased to 91% after being placed on nasal cannula oxygen  at 3 LPM.  She denied fever, chills, rhinorrhea, sore throat, wheezing or hemoptysis.  No chest pain, palpitations, diaphoresis, PND, orthopnea or pitting edema of the lower extremities.  No abdominal pain, nausea, emesis, diarrhea, constipation, melena or hematochezia.  No flank pain, dysuria, frequency or hematuria.  No polyuria, polydipsia, polyphagia or blurred vision.   ED course: Initial vital signs were temperature 97.5 F, pulse 94, respiration 18, BP 115/79 mmHg and O2 sat 90% on 4 LPM via [place].  The patient received furosemide  60 mg IVP x 1 dose.  Labwork: CBC sure white count of 10.7, hemoglobin 9.2 g/dL with an MCV of 893.5 fL and platelets 192.  Troponin was 25, then 27 and proBNP 1832.0 pg/mL.  CMP showed a chloride 94, CO2 of 35 mmol/L with an anion gap of 8, the rest of the electrolytes were normal.  Glucose 294, BUN 13 and creatinine  1.09 mg deciliter.  Hepatic functions were unremarkable.  Coronavirus, influenza and RSV PCR test was negative.  Imaging: Portable 1 view chest radiograph with mild pulmonary edema and small to moderate bilateral pleural effusions.   Review of Systems: As mentioned in the history of present illness. All other systems reviewed and are negative. Past Medical History:  Diagnosis Date   Acute kidney injury 08/05/2016   Acute respiratory failure with hypoxia (HCC) 04/19/2020   Acute stroke due to ischemia [facility])    Aftercare following surgery of the circulatory system, NEC 05/11/2013   AKI (acute kidney injury) 08/04/2016   Anxiety    takes Celexa  daily   ARF (acute renal failure) 11/07/2016   Asymptomatic stenosis of right carotid artery 03/22/2016   Back pain    occasionally   Carotid artery disease 04/16/2013   Carotid artery occlusion    Carotid stenosis 11/16/2013   Cataract    left and immature   Coronary artery disease    Coronary atherosclerosis of native coronary artery 03/11/2013   S/p CABG in 1997    Depression    Diabetes mellitus    takes Metformin  and Glipizide  daily   Diabetes mellitus (HCC) 05/02/2015   Dizziness    takes Meclizine  daily as needed   Essential hypertension, benign 03/11/2013   GERD (gastroesophageal reflux disease)    takes Omeprazole daily as needed   Headache(784.0)    Hyperlipidemia    takes Atorvastatin  daily   Hypertension    takes Carvedilol  daily   Mixed hyperlipidemia 03/11/2013   Muscle spasm    takes Robaxin  daily as needed  Nausea    takes Phenergan  daily as needed   Occlusion and stenosis of carotid artery without mention of cerebral infarction 07/19/2011   Pneumonia    hx of-in high school   Restless leg    takes Requip  daily as needed   Seasonal allergies    takes Claritin  daily as needed and Afrin as needed   Severe sepsis (HCC) 02/14/2021   Shortness of breath    with exertion   Urinary urgency    UTI (urinary tract  infection) 11/07/2016   Past Surgical History:  Procedure Laterality Date   COLONOSCOPY WITH PROPOFOL  N/A 03/10/2012   Procedure: COLONOSCOPY WITH PROPOFOL ;  Surgeon: Gladis MARLA Louder, MD;  Location: [facility];  Service: Endoscopy;  Laterality: N/A;   CORNEAL TRANSPLANT Right    CORONARY ARTERY BYPASS GRAFT  1997   x 6   CORONARY ARTERY BYPASS GRAFT  Jan. 1997   ENDARTERECTOMY Left 04/16/2013   Procedure: Left Carotid Artery Endatarectomy with Resection of Redundant Internal Carotid Artery;  Surgeon: Krystal JULIANNA Doing, MD;  Location: [facility] OR;  Service: Vascular;  Laterality: Left;   ENDARTERECTOMY Right 03/22/2016   Procedure: RIGHT ENDARTERECTOMY CAROTID;  Surgeon: Krystal JULIANNA Doing, MD;  Location: [facility] OR;  Service: Vascular;  Laterality: Right;   ESOPHAGOGASTRODUODENOSCOPY N/A 03/10/2012   Procedure: ESOPHAGOGASTRODUODENOSCOPY (EGD);  Surgeon: Gladis MARLA Louder, MD;  Location: [facility];  Service: Endoscopy;  Laterality: N/A;   EYE SURGERY  March 12, 2001   CORNEA TRANSPLANT Right eye   INTRAMEDULLARY (IM) NAIL INTERTROCHANTERIC Right 10/22/2023   Procedure: FIXATION, FRACTURE, INTERTROCHANTERIC, WITH INTRAMEDULLARY ROD;  Surgeon: Reyne Cordella SQUIBB, MD;  Location: [facility] OR;  Service: Orthopedics;  Laterality: Right;   LOOP RECORDER INSERTION N/A 11/07/2023   Procedure: LOOP RECORDER INSERTION;  Surgeon: Lesia Ozell Barter, PA-C;  Location: [facility] INVASIVE CV LAB;  Service: Cardiovascular;  Laterality: N/A;   PATCH ANGIOPLASTY Right 03/22/2016   Procedure: PATCH ANGIOPLASTY;  Surgeon: Krystal JULIANNA Doing, MD;  Location: [facility] OR;  Service: Vascular;  Laterality: Right;   PR VEIN BYPASS GRAFT,AORTO-FEM-POP  1997   SPINE SURGERY  march 2013   Back surgery   TONSILLECTOMY     TRIGGER FINGER RELEASE Left    thumb   Social History:  reports that she has never smoked. She has never used smokeless tobacco. She reports that she does not drink alcohol  and does not use drugs.  Allergies[1]  Family History  Problem  Relation Age of Onset   Heart attack Father    Heart disease Father 2       Before age of 85   Hyperlipidemia Father    Heart disease Brother        Heart dissease before age 3   Hyperlipidemia Brother    Cirrhosis Mother    Heart attack Paternal Grandfather    Heart disease Paternal Grandfather    Cancer Maternal Grandmother    Alcoholism Maternal Grandfather    Heart attack Paternal Grandmother     Prior to Admission medications  Medication Sig Start Date End Date Taking? Authorizing Provider  albuterol  (PROVENTIL ) (2.5 MG/3ML) 0.083% nebulizer solution Inhale 3 mLs (2.5 mg total) into the lungs every 6 (six) hours as needed for wheezing or shortness of breath. 11/07/23   Elgergawy, Brayton RAMAN, MD  amLODipine  (NORVASC ) 10 MG tablet Take 10 mg by mouth daily.    [provider]  apixaban  (ELIQUIS ) 5 MG TABS tablet Take 1 tablet (5 mg total) by mouth 2 (two) times  daily. 11/22/23   Terra Fairy PARAS, PA-C  bisacodyl  (DULCOLAX) 10 MG suppository Place 1 suppository (10 mg total) rectally daily as needed for severe constipation. 11/07/23   Elgergawy, Brayton RAMAN, MD  busPIRone  (BUSPAR ) 10 MG tablet Take 1 tablet (10 mg total) by mouth 2 (two) times daily. 11/07/23   Elgergawy, Brayton RAMAN, MD  calcium  carbonate (TUMS EX) 750 MG chewable tablet Chew 1 tablet by mouth daily as needed for heartburn.    [provider]  calcium -vitamin D  (OSCAL WITH D) 500-5 MG-MCG tablet Take 1 tablet by mouth 2 (two) times daily with a meal. 11/07/23   Elgergawy, Brayton RAMAN, MD  Carboxymethylcellulose Sodium (REFRESH TEARS OP) Place 1 drop into both eyes every 4 (four) hours as needed (for dry eye).    [provider]  carvedilol  (COREG ) 6.25 MG tablet Take 6.25 mg by mouth 2 (two) times daily with a meal.    [provider]  citalopram  (CELEXA ) 20 MG tablet Take 20 mg by mouth daily.    [provider]  docusate sodium  (COLACE) 100 MG capsule Take 100 mg by mouth 2 (two)  times daily.    [provider]  ferrous sulfate  325 (65 FE) MG tablet Take 325 mg by mouth daily. Monday-Friday    [provider]  furosemide  (LASIX ) 20 MG tablet Take 1 tablet (20 mg total) by mouth daily as needed for fluid or edema. take once daily as needed for weight gain of 2-3 lbs overnight or 5 lbs in 1 week. 10/27/23   Perri DELENA Meliton Mickey., MD  gabapentin  (NEURONTIN ) 300 MG capsule Take 1 capsule (300 mg total) by mouth 2 (two) times daily. 11/07/23   Elgergawy, Brayton RAMAN, MD  glipiZIDE  (GLUCOTROL  XL) 5 MG 24 hr tablet Take 1 tablet (5 mg total) by mouth daily with breakfast. 04/21/20   Milissa Tod PARAS, MD  insulin  aspart (NOVOLOG ) 100 UNIT/ML injection Inject 0-15 Units into the skin 3 (three) times daily with meals. 0-15 Units, Subcutaneous, 3 times daily with meals, First dose on Mon 11/03/23 at 1700 Correction coverage: Moderate (average weight, post-op) CBG < 70: Implement Hypoglycemia Standing Orders and refer to Hypoglycemia Standing Orders sidebar report CBG 70 - 120: 0 units CBG 121 - 150: 2 units CBG 151 - 200: 3 units CBG 201 - 250: 5 units CBG 251 - 300: 8 units CBG 301 - 350: 11 units CBG 351 - 400: 15 units 11/07/23   Elgergawy, Brayton RAMAN, MD  LANTUS  SOLOSTAR 100 UNIT/ML Solostar Pen Inject into the skin. 12/04/23   [provider]  Magnesium  Oxide -Mg Supplement 250 MG TABS Take 250 mg by mouth daily.    [provider]  melatonin 3 MG TABS tablet Take 3 mg by mouth at bedtime.    [provider]  methocarbamol  (ROBAXIN ) 500 MG tablet Take 500 mg by mouth every 8 (eight) hours as needed for muscle spasms.    [provider]  mirtazapine  (REMERON ) 7.5 MG tablet Take 7.5 mg by mouth at bedtime. 12/16/23   [provider]  oxybutynin  (DITROPAN -XL) 10 MG 24 hr tablet Take 10 mg by mouth daily.    [provider]  oxyCODONE  (OXY IR/ROXICODONE ) 5 MG immediate release tablet Take 1 tablet (5 mg total) by mouth every  6 (six) hours as needed for severe pain (pain score 7-10). 11/07/23   Elgergawy, Brayton RAMAN, MD  OXYGEN  Inhale 2 L/min into the lungs continuous.    [provider]  pantoprazole  (PROTONIX ) 40 MG tablet Take 1 tablet (40 mg total) by mouth 2 (two) times daily. 11/07/23   Elgergawy, Dawood S, MD  polyethylene glycol (MIRALAX  / GLYCOLAX ) 17 g packet Take 17 g by mouth daily as needed for moderate constipation. 11/07/23   Elgergawy, Brayton RAMAN, MD  saccharomyces boulardii (FLORASTOR) 250 MG capsule Take 250 mg by mouth daily.    [provider]  senna (SENOKOT) 8.6 MG tablet Take 2 tablets by mouth at bedtime.    [provider]    Physical Exam: Vitals:   01/06/24 0323 01/06/24 0400 01/06/24 0415 01/06/24 0604  BP: 115/79 125/61 (!) 132/95   Pulse: 94 94 100   Resp: 18 14 15    Temp: (!) 97.5 F (36.4 C)   98.4 F (36.9 C)  TempSrc: Oral   Axillary  SpO2: 90% 90% 99%    Physical Exam Vitals reviewed.  Constitutional:      General: She is awake. She is not in acute distress.    Appearance: She is ill-appearing.  HENT:     Head: Normocephalic.     Nose: No rhinorrhea.     Mouth/Throat:     Mouth: Mucous membranes are moist.  Eyes:     General: No scleral icterus.    Pupils: Pupils are equal, round, and reactive to light.  Neck:     Vascular: No JVD.  Cardiovascular:     Rate and Rhythm: Normal rate and regular rhythm.     Heart sounds: S1 normal and S2 normal.  Pulmonary:     Effort: Pulmonary effort is normal.     Breath sounds: Normal breath sounds. No wheezing, rhonchi or rales.  Abdominal:     General: Bowel sounds are normal. There is no distension.     Palpations: Abdomen is soft.     Tenderness: There is no abdominal tenderness. There is no right CVA tenderness or left CVA tenderness.  Musculoskeletal:     Cervical back: Neck supple.     Right lower leg: No edema.     Left lower leg: No edema.  Skin:    General: Skin is warm and dry.   Neurological:     General: No focal deficit present.     Mental Status: She is alert and oriented to person, place, and time.  Psychiatric:        Mood and Affect: Mood normal.        Behavior: Behavior normal. Behavior is cooperative.     Data Reviewed:  Results are pending, will review when available. 10/22/2023 transthoracic echocardiogram report. IMPRESSIONS:   1. Left ventricular ejection fraction, by estimation, is >75%. The left  ventricle has hyperdynamic function. The left ventricle has no regional  wall motion abnormalities. Left ventricular diastolic parameters are  consistent with Grade II diastolic  dysfunction (pseudonormalization).   2. Right ventricular systolic function is normal. The right ventricular  size is normal. There is severely elevated pulmonary artery systolic  pressure.   3. Left atrial size was moderately dilated.   4. Right atrial size was mildly dilated.   5. The mitral valve is normal in structure. Trivial mitral valve  regurgitation. No evidence of mitral stenosis.   6. The aortic valve is tricuspid. Aortic valve regurgitation is not  visualized. Mild aortic valve stenosis.   7. Aortic dilatation noted. There is borderline dilatation of the  ascending aorta, measuring 39 mm.   8. The inferior vena cava is normal in size  with greater than 50%  respiratory variability, suggesting right atrial pressure of 3 mmHg.   EKG: Vent. rate 109 BPM PR interval * ms QRS duration 110 ms QT/QTcB 363/489 ms P-R-T axes * 89 24 Atrial fibrillation Borderline right axis deviation Borderline T abnormalities, anterior leads Borderline prolonged QT interval  Assessment and Plan: Principal Problem:   Acute hypoxic respiratory failure (HCC) In the setting of:   Acute on chronic diastolic CHF (congestive heart failure) (HCC) Observation/telemetry. Supplemental oxygen  as needed. Sodium and fluid restriction. Continue furosemide  20 mg IVP twice  daily. Monitor daily weights, intake and output. Monitor renal function electrolytes. Check echocardiogram. Cardiology will evaluate.  Active Problems:   Controlled type 2 diabetes mellitus with hyperglycemia (HCC)  Carbohydrate modified diet. CBG monitoring with RI SS. Continue Lantus  4 units SQ at bedtime.    Persistent atrial fibrillation (HCC) Continue apixaban  twice daily. Continue carvedilol  twice daily for rate control    Coronary artery disease   HLD (hyperlipidemia) Continue beta-blocker and apixaban . Would benefit from statin therapy.    Essential hypertension Hold amlodipine . Continue carvedilol  6.25 mg p.o. twice daily. Home furosemide  as above.    Anxiety and depression Continue mirtazapine  7.5 mg p.o. at bedtime. Continue buspirone  10 mg p.o. twice daily. Continue melatonin 3 mg p.o. bedtime.    Gastro-esophageal reflux disease without esophagitis Continue pantoprazole  40 mg p.o. twice daily.    Peripheral neuropathy Continue gabapentin  300 mg p.o. twice daily. Continue oxycodone  5 mg p.o. every 6 hours as needed.    Advance Care Planning:   Code Status: Full Code   Consults:   Family Communication:   Severity of Illness: The appropriate patient status for this patient is INPATIENT. Inpatient status is judged to be reasonable and necessary in order to provide the required intensity of service to ensure the patient's safety. The patient's presenting symptoms, physical exam findings, and initial radiographic and laboratory data in the context of their chronic comorbidities is felt to place them at high risk for further clinical deterioration. Furthermore, it is not anticipated that the patient will be medically stable for discharge from the hospital within 2 midnights of admission.   * I certify that at the point of admission it is my clinical judgment that the patient will require inpatient hospital care spanning beyond 2 midnights from the point of  admission due to high intensity of service, high risk for further deterioration and high frequency of surveillance required.*  Author: Alm Dorn Castor, MD 01/06/2024 7:56 AM  For on call review [email or web address].      [1]  Allergies Allergen Reactions   Latex Other (See Comments), Rash and Hives    tears skin   Codeine Other (See Comments)    Abnormal behavior   Morphine  And Codeine Other (See Comments)    Affects BP and blood sugar.   Cyclobenzaprine Other (See Comments)    MADE PT SICK- pt unsure if med was cyclobenzaprine or methocarbamol    Jardiance [Empagliflozin]    "

## 2024-01-06 NOTE — ED Triage Notes (Signed)
 Pt BBIB [facility] for low O2 sat and SOB. EMS reports that pt's O2 sat was in 50's upon their arrival. After increasing O2 to 3L via nasal cannula, sat increased to 91%, Pt recently had right hip surgery and is nonweightbearing and bed bound. Pt comes from [place]

## 2024-01-06 NOTE — ED Notes (Signed)
 NRB removed patient sat 99% [place] 2L  Patient decreased to Spo2 in the  70s  NRB replaced patient repositioned returned to [place] @ 3L 97% SPO2 Patient denies SOB and increased work to breath patient encouraged to take deep breaths patient expressed understanding  Patient maintaining 97% SPO2 while eating breakfast at this time

## 2024-01-07 DIAGNOSIS — J9621 Acute and chronic respiratory failure with hypoxia: Secondary | ICD-10-CM

## 2024-01-07 DIAGNOSIS — J9622 Acute and chronic respiratory failure with hypercapnia: Secondary | ICD-10-CM | POA: Diagnosis not present

## 2024-01-07 LAB — CBG MONITORING, ED
Glucose-Capillary: 230 mg/dL — ABNORMAL HIGH (ref 70–99)
Glucose-Capillary: 172 mg/dL — ABNORMAL HIGH (ref 70–99)

## 2024-01-07 LAB — CBC
HCT: 27.9 % — ABNORMAL LOW (ref 36.0–46.0)
Hemoglobin: 8.6 g/dL — ABNORMAL LOW (ref 12.0–15.0)
MCH: 32.2 pg (ref 26.0–34.0)
MCHC: 30.8 g/dL (ref 30.0–36.0)
MCV: 104.5 fL — ABNORMAL HIGH (ref 80.0–100.0)
Platelets: 146 K/uL — ABNORMAL LOW (ref 150–400)
RBC: 2.67 MIL/uL — ABNORMAL LOW (ref 3.87–5.11)
RDW: 14 % (ref 11.5–15.5)
WBC: 4.6 K/uL (ref 4.0–10.5)
nRBC: 0 % (ref 0.0–0.2)

## 2024-01-07 LAB — BASIC METABOLIC PANEL WITH GFR
Anion gap: 5 (ref 5–15)
BUN: 15 mg/dL (ref 8–23)
CO2: 37 mmol/L — ABNORMAL HIGH (ref 22–32)
Calcium: 9.8 mg/dL (ref 8.9–10.3)
Chloride: 95 mmol/L — ABNORMAL LOW (ref 98–111)
Creatinine, Ser: 1.03 mg/dL — ABNORMAL HIGH (ref 0.44–1.00)
GFR, Estimated: 54 mL/min — ABNORMAL LOW
Glucose, Bld: 283 mg/dL — ABNORMAL HIGH (ref 70–99)
Potassium: 4.3 mmol/L (ref 3.5–5.1)
Sodium: 137 mmol/L (ref 135–145)

## 2024-01-07 LAB — GLUCOSE, CAPILLARY: Glucose-Capillary: 180 mg/dL — ABNORMAL HIGH (ref 70–99)

## 2024-01-07 NOTE — ED Notes (Signed)
 Breakfast tray given to patient.

## 2024-01-07 NOTE — Progress Notes (Signed)
 " Progress Note   Patient: Andrea Kirk FMW:[id number] DOB: 1940/09/04 DOA: 01/06/2024     1 DOS: the patient was seen and examined on 01/07/2024   Brief hospital course: 83yo with h/o CVA, depression/anxiety, CAD s/p CABG, HTN, T2DM, and HLD who presented on 12/30 with SOB.  O2 sats were in the 50s on presentation.  Troponin 25, 27.  Pro-BNP 1832.  CXR with mild pulmonary edema and small to moderate B effusions.  Given IV Lasix  and started on supplemental O2 for CHF exacerbation.  Assessment and Plan:  Assessment & Plan Acute on chronic hypoxic respiratory failure (HCC) Acute on chronic diastolic CHF (congestive heart failure) (HCC) Patient is on 2L home O2 Presented with O2 sats in the 50s at SNF Elevated BNP, mildly elevated troponin - appears to be c/w CHF exacerbation Last echo was in 10/2023 and showed preserved EF Continue furosemide  20 mg IVP twice daily Monitor daily weights, intake and output Repeat echocardiogram limited Coronary artery disease Continue beta-blocker Controlled type 2 diabetes mellitus with hyperglycemia (HCC) Last A1c was 5.7, good control Continue 4 units Lantus  qhs Cover with moderate-scale SSI Carb modified diet  Essential hypertension Hold amlodipine  Continue carvedilol  6.25 mg p.o. twice daily HLD (hyperlipidemia) Would benefit from statin therapy Anxiety and depression Continue mirtazapine , buspirone , and melatonin  Gastro-esophageal reflux disease without esophagitis Continue PPI Peripheral neuropathy Continue gabapentin  and oxycodone  I have reviewed this patient in the [place] Controlled Substances Reporting System.  She is receiving medications from only one provider and appears to be taking them as prescribed. She is not at particularly high risk of opioid misuse, diversion, or overdose.  Persistent atrial fibrillation (HCC) Continue apixaban  twice daily Continue carvedilol  twice daily for rate control        Consultants: None  Procedures: Echocardiogram  Antibiotics: None    Subjective: Feeling some better.  No specific concerns.  Physical Exam: Vitals:   01/07/24 0300 01/07/24 0500 01/07/24 0645 01/07/24 0713  BP: 108/79 (!) 144/87 121/64   Pulse: 72 96 (!) 105   Resp: 14 16 20    Temp:    (!) 97.4 F (63.6 C)  TempSrc:    Oral  SpO2: 100% 100% 99%       Intake/Output Summary (Last 24 hours) at 01/07/2024 1001 Last data filed at 01/07/2024 0800 Gross per 24 hour  Intake 1320 ml  Output 1850 ml  Net -530 ml   There were no vitals filed for this visit.  Exam:  General:  Appears calm and comfortable and is in NAD, on [place] O2 Eyes:  normal lids, iris ENT:  grossly normal hearing, lips & tongue, mmm Cardiovascular:  RRR, no m/r/g. Tr LE edema.  Respiratory:   CTA bilaterally with no wheezes/rales/rhonchi.  Normal respiratory effort. Abdomen:  soft, NT, ND Skin:  no rash or induration seen on limited exam Musculoskeletal:  grossly normal tone BUE/BLE, good ROM, no bony abnormality Psychiatric:  grossly normal mood and affect, speech fluent and appropriate, AOx3 Neurologic:  CN 2-12 grossly intact, moves all extremities in coordinated fashion  Data Reviewed: I have reviewed the patient's lab results since admission.  Pertinent labs for today include:  CO2 37 Glucose 283 BUN 15/Creatinine 1.03/GFR 54 WBC 4.6 Hgb 8.6 Platelets 146     Family Communication: None present; will try to call her brother (MD in [facility]) tomorrow  Disposition: Status is: Inpatient Remains inpatient appropriate because: ongoing management  Planned Discharge Destination: Skilled nursing facility - resides in LTC at [facility]  Time spent: 50 minutes  Author: Delon Herald, MD 01/07/2024 9:57 AM  For on call review [email or web address].  "

## 2024-01-07 NOTE — Assessment & Plan Note (Addendum)
 Continue apixaban  twice daily Continue carvedilol  twice daily for rate control

## 2024-01-07 NOTE — Assessment & Plan Note (Addendum)
 Continue gabapentin  and oxycodone  I have reviewed this patient in the [place] Controlled Substances Reporting System.  She is receiving medications from only one provider and appears to be taking them as prescribed. She is not at particularly high risk of opioid misuse, diversion, or overdose.

## 2024-01-07 NOTE — Assessment & Plan Note (Addendum)
 Last A1c was 5.7, good control Continue 4 units Lantus  qhs Cover with moderate-scale SSI Carb modified diet

## 2024-01-07 NOTE — ED Notes (Signed)
 Pt had desatted to 65% while sleeping,  upped her ox to 4L [place] with rebound to 94%

## 2024-01-07 NOTE — ED Notes (Signed)
 Bed and brief change/clean up.

## 2024-01-07 NOTE — Assessment & Plan Note (Addendum)
 Continue beta blocker.

## 2024-01-07 NOTE — Assessment & Plan Note (Addendum)
 Hold amlodipine  Continue carvedilol  6.25 mg p.o. twice daily

## 2024-01-07 NOTE — Assessment & Plan Note (Addendum)
 Patient is on 2L home O2 Presented with O2 sats in the 50s at SNF Elevated BNP, mildly elevated troponin - appears to be c/w CHF exacerbation Last echo was in 10/2023 and showed preserved EF Continue furosemide  20 mg IVP twice daily Monitor daily weights, intake and output Repeat echocardiogram limited

## 2024-01-07 NOTE — Assessment & Plan Note (Addendum)
"   Continue PPI  "

## 2024-01-07 NOTE — Assessment & Plan Note (Addendum)
 Would benefit from statin therapy

## 2024-01-07 NOTE — Assessment & Plan Note (Addendum)
 Continue mirtazapine , buspirone , and melatonin

## 2024-01-07 NOTE — ED Notes (Signed)
 Pt pain increased to 10/10 with bed change,  pt resting comfortably after IV pain med,  Pt resting, warm and pink with equal rise and fall of chest noted.

## 2024-01-08 ENCOUNTER — Ambulatory Visit

## 2024-01-08 ENCOUNTER — Inpatient Hospital Stay (HOSPITAL_COMMUNITY)

## 2024-01-08 DIAGNOSIS — L8990 Pressure ulcer of unspecified site, unspecified stage: Secondary | ICD-10-CM | POA: Insufficient documentation

## 2024-01-08 DIAGNOSIS — I5031 Acute diastolic (congestive) heart failure: Secondary | ICD-10-CM

## 2024-01-08 DIAGNOSIS — J9621 Acute and chronic respiratory failure with hypoxia: Secondary | ICD-10-CM | POA: Diagnosis not present

## 2024-01-08 DIAGNOSIS — Z8781 Personal history of (healed) traumatic fracture: Secondary | ICD-10-CM

## 2024-01-08 LAB — BASIC METABOLIC PANEL WITH GFR
Anion gap: 9 (ref 5–15)
BUN: 13 mg/dL (ref 8–23)
CO2: 35 mmol/L — ABNORMAL HIGH (ref 22–32)
Calcium: 10.1 mg/dL (ref 8.9–10.3)
Chloride: 92 mmol/L — ABNORMAL LOW (ref 98–111)
Creatinine, Ser: 0.79 mg/dL (ref 0.44–1.00)
GFR, Estimated: >60 mL/min
Glucose, Bld: 197 mg/dL — ABNORMAL HIGH (ref 70–99)
Potassium: 4.6 mmol/L (ref 3.5–5.1)
Sodium: 135 mmol/L (ref 135–145)

## 2024-01-08 LAB — CBC
HCT: 29.8 % — ABNORMAL LOW (ref 36.0–46.0)
Hemoglobin: 9.4 g/dL — ABNORMAL LOW (ref 12.0–15.0)
MCH: 32.5 pg (ref 26.0–34.0)
MCHC: 31.5 g/dL (ref 30.0–36.0)
MCV: 103.1 fL — ABNORMAL HIGH (ref 80.0–100.0)
Platelets: 184 K/uL (ref 150–400)
RBC: 2.89 MIL/uL — ABNORMAL LOW (ref 3.87–5.11)
RDW: 13.7 % (ref 11.5–15.5)
WBC: 6.5 K/uL (ref 4.0–10.5)
nRBC: 0 % (ref 0.0–0.2)

## 2024-01-08 LAB — ECHOCARDIOGRAM LIMITED
Height: 62 in
S' Lateral: 2.8 cm
Single Plane A4C EF: 51.3 %
Weight: 2899.49 [oz_av]

## 2024-01-08 LAB — GLUCOSE, CAPILLARY
Glucose-Capillary: 168 mg/dL — ABNORMAL HIGH (ref 70–99)
Glucose-Capillary: 244 mg/dL — ABNORMAL HIGH (ref 70–99)
Glucose-Capillary: 207 mg/dL — ABNORMAL HIGH (ref 70–99)
Glucose-Capillary: 248 mg/dL — ABNORMAL HIGH (ref 70–99)

## 2024-01-08 MED ORDER — IOHEXOL 350 MG/ML SOLN
100.0000 mL | Freq: Once | INTRAVENOUS | Status: AC | PRN
  Administered 2024-01-08: 75 mL via INTRAVENOUS

## 2024-01-08 MED ORDER — ZINC OXIDE 40 % EX OINT
TOPICAL_OINTMENT | Freq: Two times a day (BID) | CUTANEOUS | Status: DC
  Administered 2024-01-08 – 2024-01-11 (×2): 1 via TOPICAL
  Filled 2024-01-08: qty 57

## 2024-01-08 NOTE — Progress Notes (Signed)
 " Progress Note   Patient: Andrea Kirk FMW:[id number] DOB: 1940/08/25 DOA: 01/06/2024     2 DOS: the patient was seen and examined on 01/08/2024   Brief hospital course: 84yo with h/o CVA, depression/anxiety, CAD s/p CABG, HTN, T2DM, and HLD who presented on 12/30 with SOB. O2 sats were in the 50s on presentation. Troponin 25, 27. Pro-BNP 1832. CXR with mild pulmonary edema and small to moderate B effusions. Given IV Lasix  and started on supplemental O2 for CHF exacerbation.    Assessment & Plan Acute on chronic hypoxic respiratory failure (HCC) Acute on chronic diastolic CHF (congestive heart failure) (HCC) Patient is on 2L home O2 Presented with O2 sats in the 50s at SNF Elevated BNP, mildly elevated troponin - appears to be c/w CHF exacerbation Last echo was in 10/2023 and showed preserved EF (actually hyperdynamic) and grade 2 diastolic dysfunction Continue furosemide  20 mg IVP twice daily Monitor daily weights, intake and output Repeat echocardiogram limited showed EF 50-55% with RV volume overload Will order CTA to evaluate for PE She is on 3L at this time, dropped into the 60s on 2L with sleeping and appears to have some sleep apnea per nursing staff Coronary artery disease Continue beta-blocker Controlled type 2 diabetes mellitus with hyperglycemia (HCC) Last A1c was 5.7, good control Continue 4 units Lantus  qhs Cover with moderate-scale SSI Carb modified diet  Essential hypertension Hold amlodipine  Continue carvedilol  6.25 mg p.o. twice daily HLD (hyperlipidemia) Would benefit from statin therapy Anxiety and depression Continue mirtazapine , buspirone , and melatonin  Gastro-esophageal reflux disease without esophagitis Continue PPI Peripheral neuropathy Continue gabapentin  and oxycodone  I have reviewed this patient in the  Controlled Substances Reporting System.  She is receiving medications from only one provider and appears to be taking them as prescribed. She is not  at particularly high risk of opioid misuse, diversion, or overdose.  Persistent atrial fibrillation (HCC) Continue apixaban  twice daily Continue carvedilol  twice daily for rate control - HR 97-109, currently 101  History of hip fracture S/p IM nail fixation of R intertrochanteric fracture on 10/31 Has completed STR at [facility] and is back in LTC at this time Pressure injury Wound 01/07/24 2018 Pressure Injury Sacrum Left;Right Deep Tissue Pressure Injury - Purple or maroon localized area of discolored intact skin or blood-filled blister due to damage of underlying soft tissue from pressure and/or shear. (Active)        Consultants: None   Procedures: Echocardiogram 1/1   Antibiotics: None    30 Day Unplanned Readmission Risk Score    Flowsheet Row ED to Hosp-Admission (Current) from 01/06/2024 in  4TH FLOOR PROGRESSIVE CARE AND UROLOGY  30 Day Unplanned Readmission Risk Score (%) 24.86 Filed at 01/08/2024 1200    This score is the patient's risk of an unplanned readmission within 30 days of being discharged (0 -100%). The score is based on dignosis, age, lab data, medications, orders, and past utilization.   Low:  0-14.9   Medium: 15-21.9   High: 22-29.9   Extreme: 30 and above           Subjective: She is somnolent today.  Was able to tell me the importance of the [place]  game today and that she needs to speak with her brother about it, but otherwise too somnolent to have a meaningful conversation (s/p OT).   I spoke with her brother, a physician.  She is not doing that well - had a hip fracture, in afib, and CHF.  He has  not spoken with her today, she seemed better yesterday after getting into the room.  Care at her facility is mid level and he would prefer that she stay in the hospital at least 1 more day.     Objective: Vitals:   01/08/24 0132 01/08/24 0758  BP: 103/71 119/83  Pulse: 98 (!) 101  Resp:  20  Temp: 98 F (36.7 C) 98.7 F (37.1 C)   SpO2: 100% 100%    Intake/Output Summary (Last 24 hours) at 01/08/2024 1332 Last data filed at 01/08/2024 1255 Gross per 24 hour  Intake 240 ml  Output 1200 ml  Net -960 ml   Filed Weights   01/07/24 2028 01/08/24 0500  Weight: 84.2 kg 82.2 kg    Exam:  General:  Appears calm and comfortable and is in NAD, on 3L [place] O2 Eyes:  normal lids, iris ENT:  grossly normal hearing, lips & tongue, mmm Cardiovascular:  Irregularly irregular without significant tachycardia. No LE edema.  Respiratory:   CTA bilaterally with no wheezes/rales/rhonchi.  Normal respiratory effort. Abdomen:  soft, NT, ND Skin:  no rash or induration seen on limited exam Musculoskeletal:  generalized weakness, no bony abnormality Psychiatric:  somnolent mood and affect, speech sparse but appropriate, AOx3 Neurologic:  CN 2-12 grossly intact, moves all extremities in coordinated fashion  Data Reviewed: I have reviewed the patient's lab results since admission.  Pertinent labs for today include:   CO2 35 Glucose 197 WBC 6.5 Hgb 9.4     Family Communication: None present; I spoke with her brother (physician) by telephone  Mobility: PT/OT Consulted      Code Status: Full Code    Disposition: Status is: Inpatient Remains inpatient appropriate because: ongoing management     Time spent: 50 minutes  Unresulted Labs (From admission, onward)    None        Author: Delon Herald, MD 01/08/2024 1:32 PM  For on call review [email or web address].            "

## 2024-01-08 NOTE — Hospital Course (Signed)
 83yo with h/o CVA, depression/anxiety, CAD s/p CABG, HTN, T2DM, and HLD who presented on 12/30 with SOB. O2 sats were in the 50s on presentation. Troponin 25, 27. Pro-BNP 1832. CXR with mild pulmonary edema and small to moderate B effusions. Given IV Lasix  and started on supplemental O2 for CHF exacerbation.

## 2024-01-08 NOTE — Assessment & Plan Note (Addendum)
 S/p IM nail fixation of R intertrochanteric fracture on 10/31 Has completed STR at [facility] and is back in LTC at this time

## 2024-01-08 NOTE — Assessment & Plan Note (Deleted)
 Hold amlodipine  Continue carvedilol  6.25 mg p.o. twice daily

## 2024-01-08 NOTE — Assessment & Plan Note (Addendum)
 Last A1c was 5.7, good control Continue 4 units Lantus  qhs Cover with moderate-scale SSI Carb modified diet

## 2024-01-08 NOTE — Assessment & Plan Note (Deleted)
"   Continue PPI  "

## 2024-01-08 NOTE — Progress Notes (Signed)
" °  Echocardiogram 2D Echocardiogram has been performed.  Tinnie FORBES Gosling RDCS 01/08/2024, 8:36 AM "

## 2024-01-08 NOTE — Assessment & Plan Note (Addendum)
 Patient is on 2L home O2 Presented with O2 sats in the 50s at SNF Elevated BNP, mildly elevated troponin - appears to be c/w CHF exacerbation Last echo was in 10/2023 and showed preserved EF (actually hyperdynamic) and grade 2 diastolic dysfunction Continue furosemide  20 mg IVP twice daily Monitor daily weights, intake and output Repeat echocardiogram limited showed EF 50-55% with RV volume overload Will order CTA to evaluate for PE She is on 3L at this time, dropped into the 60s on 2L with sleeping and appears to have some sleep apnea per nursing staff

## 2024-01-08 NOTE — Progress Notes (Signed)
 PT Cancellation Note  Patient Details Name: Andrea Kirk MRN: [id number] DOB: December 26, 1940   Cancelled Treatment:    Reason Eval/Treat Not Completed: Medical issues which prohibited therapy  PT orders received for possible d/c back to LTC today.  At arrival, pt very lethargic and difficult to arouse.  She was on 2 L O2 but sats found to be in the 60's - checked with 2 oximeters and both with good pleths.  Max verbal/tactile cues to keep pt alert and cues to breath in nose, also increased to 3 L.  Sats quickly return to 90% when pt stimulated and breathing in nose; however, if not stimulated and cued to breath in nose she falls back asleep and sats down to 78% quickly.  Noticed periods of apnea with sleep.  Nurse alerted and present and notified MD.  Pt with CTA order to r/o PE.  Will f/u later date. Benjiman, PT Acute Rehab [facility] [phone]  [name] 01/08/2024, 1:32 PM

## 2024-01-08 NOTE — Consult Note (Signed)
 WOC Nurse Consult Note: Reason for Consult: deep tissue pressure injury  Wound type Deep tissue pressure injury B medial buttocks purple maroon discoloration with some peeling dry skin noted; ? Small area purple maroon to sacrum  Pressure Injury POA: Yes  Measurement: see nursing flowsheet  Wound bed: as above  Drainage (amount, consistency, odor) see nursing flowsheet  Periwound: erythema  Dressing procedure/placement/frequency: Cleanse sacrum/buttocks with soap and water, dry and apply a thin layer of Desitin 2 times daily and prn soiling.  Cover with silicone foam or ABD pad and tape whichever is preferred/works best.   POC discussed with bedside nurse. WOC team will not follow. Reconsult if further needs arise.   Thank you,    Powell Bar MSN, RN-BC, [employer]

## 2024-01-08 NOTE — Progress Notes (Signed)
 Patient again de-satting to 60s on 3L/[place] when falling asleep. When aroused, O2 Sats quickly come up to upper 90's again. But O2 Sats fluctuate very quickly when sleeping. Patient A&Ox4 still, forgetful at times. Sleeps with her mouth open and has to be reminded to breathe out of her nose. MD Barbarann made aware again, RT also at bedside. Face mask placed on patient while she is sleepy. Will continue to monitor with continuous pulse ox.

## 2024-01-08 NOTE — Assessment & Plan Note (Deleted)
 Patient is on 2L home O2 Presented with O2 sats in the 50s at SNF Elevated BNP, mildly elevated troponin - appears to be c/w CHF exacerbation Last echo was in 10/2023 and showed preserved EF (actually hyperdynamic) and grade 2 diastolic dysfunction Continue furosemide  20 mg IVP twice daily Monitor daily weights, intake and output Repeat echocardiogram limited showed EF 50-55% with RV volume overload Will order CTA to evaluate for PE She is on 3L at this time, dropped into the 60s on 2L with sleeping and appears to have some sleep apnea per nursing staff

## 2024-01-08 NOTE — Assessment & Plan Note (Deleted)
 Would benefit from statin therapy

## 2024-01-08 NOTE — Evaluation (Addendum)
 Occupational Therapy Evaluation Patient Details Name: Andrea Kirk MRN: [id number] DOB: 02-Dec-1940 Today's Date: 01/08/2024   History of Present Illness   Andrea Kirk is an 84 yo who presented on 12/30 with SOB.  O2 sats were in the 50s on presentation.  Troponin 25, 27.  Pro-BNP 1832.  CXR with mild pulmonary edema and small to moderate B effusions.  Given IV Lasix  and started on supplemental O2 for CHF exacerbation PMH: h/o CVA, depression/anxiety, CAD s/p CABG, HTN, T2DM, R hip fx/ORIF and HLD     Clinical Impressions PTA, patient lives at [facility] for LTC for the past 3 years, on 2 ltrs O2 at baseline and has been receiving PT post R hip fx/orif. Staff assisted with all transfers bed to w/c, using briefs for toileting and sponge bathing and has been only standing with PFRW and in parallel bars with PT as per patient report. Was able to complete UB ADL's w/c level. Currently, patient presents with deficits outlined below (see OT Problem List for details) most significantly pain, decreased activity tolerance on 3 ltrs O2 via [place] with SpO2 remaining 94-100% during session, generalized muscle weakness and balance deficits limiting BADL's (max-total A LB) and functional mobility (+2 EOB with trial STS in STEDY total A) performance. Patient does have goals to progress mobility and ADL's and OT recommending follow up OT in LTC setting once discharged from acute setting. Patient requires continued Acute care hospital level OT services to progress safety and functional performance and allow for discharge.       If plan is discharge home, recommend the following:   Two people to help with walking and/or transfers;A lot of help with bathing/dressing/bathroom;Assistance with cooking/housework;Assist for transportation;Help with stairs or ramp for entrance     Functional Status Assessment   Patient has had a recent decline in their functional status and demonstrates the ability to make significant  improvements in function in a reasonable and predictable amount of time.     Equipment Recommendations   None recommended by OT      Precautions/Restrictions   Precautions Precautions: Fall Precaution/Restrictions Comments: watch O2/HR Restrictions Weight Bearing Restrictions Per Provider Order: No     Mobility Bed Mobility Overal bed mobility: Needs Assistance Bed Mobility: Supine to Sit, Sit to Supine     Supine to sit: Max assist, +2 for physical assistance, +2 for safety/equipment, Used rails, HOB elevated Sit to supine: Max assist, +2 for physical assistance, +2 for safety/equipment, HOB elevated, Used rails   General bed mobility comments: use of bed pad for mobility    Transfers Overall transfer level: Needs assistance Equipment used: Ambulation equipment used Transfers: Sit to/from Stand Sit to Stand: +2 physical assistance, +2 safety/equipment, Total assist, From elevated surface, Via lift equipment           General transfer comment: use of STEDY for +2 attempt STS, pain in B knees and tolerance limited perfomance, refused OOB with maximove Transfer via Lift Equipment: Stedy    Balance Overall balance assessment: Needs assistance Sitting-balance support: Feet unsupported, No upper extremity supported Sitting balance-Leahy Scale: Fair     Standing balance support: Bilateral upper extremity supported, Reliant on assistive device for balance Standing balance-Leahy Scale: Poor                             ADL either performed or assessed with clinical judgement   ADL Overall ADL's : Needs assistance/impaired Eating/Feeding: Set up;Bed  level   Grooming: Wash/dry hands;Wash/dry face;Minimal assistance;Cueing for UE precautions   Upper Body Bathing: Maximal assistance;Sitting   Lower Body Bathing: Total assistance;Bed level   Upper Body Dressing : Moderate assistance;Sitting   Lower Body Dressing: Total assistance;Bed level   Toilet  Transfer:  (deferred)   Toileting- Clothing Manipulation and Hygiene: Total assistance Toileting - Clothing Manipulation Details (indicate cue type and reason): usign bed pad and purewick     Functional mobility during ADLs: +2 for physical assistance;+2 for safety/equipment;Maximal assistance;Total assistance General ADL Comments: EOB tolerance low with decreased functional reach and inability to stand inside STEDY this session     Vision Baseline Vision/History: 1 Wears glasses Ability to See in Adequate Light: 0 Adequate Patient Visual Report: No change from baseline Vision Assessment?: Wears glasses for reading;No apparent visual deficits            Pertinent Vitals/Pain Pain Assessment Pain Assessment: Faces Faces Pain Scale: Hurts whole lot Pain Location: B knees Pain Descriptors / Indicators: Aching, Sore Pain Intervention(s): Limited activity within patient's tolerance, Monitored during session, Premedicated before session, Repositioned, Patient requesting pain meds-RN notified, RN gave pain meds during session, Heat applied     Extremity/Trunk Assessment Upper Extremity Assessment Upper Extremity Assessment: Right hand dominant;Generalized weakness   Lower Extremity Assessment Lower Extremity Assessment: Defer to PT evaluation   Cervical / Trunk Assessment Cervical / Trunk Assessment: Kyphotic   Communication Communication Communication: No apparent difficulties   Cognition Arousal: Alert Behavior During Therapy: Flat affect, Anxious, Lability Cognition: No apparent impairments                               Following commands: Intact       Cueing  General Comments   Cueing Techniques: Verbal cues  SpO2 100% on 3 ltrs O2, BP 123/85 HR 84 bpm           Home Living Family/patient expects to be discharged to:: Skilled nursing facility                                 Additional Comments: LTC at [facility], 3 years       Prior Functioning/Environment Prior Level of Function : Needs assist;History of Falls (last six months)             Mobility Comments: requires assist withy all transfers to w/c with PFRW, has not been amb except with therapy in parallel bars since hip fx, 2 ltrs O2 at baseline ADLs Comments: assist for all LB ADL's, uses brief for toileting, sponge bathes    OT Problem List: Decreased strength;Decreased activity tolerance;Impaired balance (sitting and/or standing);Cardiopulmonary status limiting activity;Obesity;Pain   OT Treatment/Interventions: Self-care/ADL training;Therapeutic exercise;Neuromuscular education;Energy conservation;DME and/or AE instruction;Therapeutic activities;Patient/family education;Balance training      OT Goals(Current goals can be found in the care plan section)   Acute Rehab OT Goals Patient Stated Goal: to get my body back OT Goal Formulation: With patient Time For Goal Achievement: 01/22/24 Potential to Achieve Goals: Fair ADL Goals Pt Will Perform Grooming: with set-up;sitting Pt Will Perform Upper Body Bathing: with contact guard assist;sitting Pt Will Perform Upper Body Dressing: with contact guard assist;sitting Pt Will Transfer to Toilet: with +2 assist;with max assist;bedside commode   OT Frequency:  Min 2X/week       AM-PAC OT 6 Clicks Daily Activity     Outcome  Measure Help from another person eating meals?: A Little Help from another person taking care of personal grooming?: A Little Help from another person toileting, which includes using toliet, bedpan, or urinal?: Total Help from another person bathing (including washing, rinsing, drying)?: A Lot Help from another person to put on and taking off regular upper body clothing?: A Lot Help from another person to put on and taking off regular lower body clothing?: Total 6 Click Score: 12   End of Session Equipment Utilized During Treatment: Gait belt;Oxygen ;Rolling walker (2  wheels);Other (comment) (STEDY) Nurse Communication: Mobility status;Patient requests pain meds  Activity Tolerance: Patient limited by pain;Patient limited by fatigue Patient left: in bed;with call bell/phone within reach;with bed alarm set  OT Visit Diagnosis: Unsteadiness on feet (R26.81);Other abnormalities of gait and mobility (R26.89);Muscle weakness (generalized) (M62.81);History of falling (Z91.81);Pain Pain - part of body: Knee (bilateral)                Time: 9144-9047 OT Time Calculation (min): 57 min Charges:  OT General Charges $OT Visit: 1 Visit OT Evaluation $OT Eval Low Complexity: 1 Low OT Treatments $Self Care/Home Management : 8-22 mins $Therapeutic Activity: 8-22 mins  Kimbra Marcelino OT/L Acute Rehabilitation Department  [phone]  01/08/2024, 10:10 AM

## 2024-01-08 NOTE — Assessment & Plan Note (Addendum)
 Hold amlodipine  Continue carvedilol  6.25 mg p.o. twice daily

## 2024-01-08 NOTE — Assessment & Plan Note (Addendum)
 Continue gabapentin  and oxycodone  I have reviewed this patient in the [place] Controlled Substances Reporting System.  She is receiving medications from only one provider and appears to be taking them as prescribed. She is not at particularly high risk of opioid misuse, diversion, or overdose.

## 2024-01-08 NOTE — Assessment & Plan Note (Addendum)
"   Continue PPI  "

## 2024-01-08 NOTE — Assessment & Plan Note (Deleted)
 Continue apixaban  twice daily Continue carvedilol  twice daily for rate control - HR 97-109, currently 101

## 2024-01-08 NOTE — Assessment & Plan Note (Addendum)
 Wound 01/07/24 2018 Pressure Injury Sacrum Left;Right Deep Tissue Pressure Injury - Purple or maroon localized area of discolored intact skin or blood-filled blister due to damage of underlying soft tissue from pressure and/or shear. (Active)

## 2024-01-08 NOTE — TOC Transition Note (Addendum)
 Transition of Care [facility]) - Discharge Note   Patient Details  Name: Andrea Kirk MRN: [id number] Date of Birth: [birth year]  [facility] CM/SW Contact:  Bascom Service, RN Phone Number: 01/08/2024, 11:11 AM   Clinical Narrative: d/c back to [facility]-LTC rep Rhonda aware,accepted back. Await d/c summary prior [facility](PTAR closed)  -1:22p Per attending not med stable for d/c today.    Final next level of care: Long Term Nursing Home Barriers to Discharge: No Barriers Identified   Patient Goals and CMS Choice Patient states their goals for this hospitalization and ongoing recovery are:: LTC CMS Medicare.gov Compare Post Acute Care list provided to:: Patient Choice offered to / list presented to : Patient [place] ownership interest in [facility].provided to:: Patient    Discharge Placement                       Discharge Plan and Services Additional resources added to the After Visit Summary for     Discharge Planning Services: CM Consult Post Acute Care Choice: Nursing Home                               Social Drivers of Health (SDOH) Interventions SDOH Screenings   Food Insecurity: No Food Insecurity (01/07/2024)  Housing: Low Risk (01/07/2024)  Transportation Needs: No Transportation Needs (01/07/2024)  Utilities: Not At Risk (01/07/2024)  Social Connections: Unknown (01/07/2024)  Tobacco Use: Low Risk (01/06/2024)     Readmission Risk Interventions     No data to display

## 2024-01-08 NOTE — Assessment & Plan Note (Deleted)
 Continue mirtazapine , buspirone , and melatonin

## 2024-01-08 NOTE — Progress Notes (Signed)
 MD and RT notified around 1340 regarding patient's O2 Sats- O2 Sats 94% 3L/[place] when alert, quickly drops to 60/70s when sleeping/mumbling. Pt did have periods of apnea when sleeping. Patient taken to CT with RN as ordered, more alert at this time and O2 Sats remaining >94% 3L/[place]. Patient is now on continuous pulse ox. Will continue to monitor closely.

## 2024-01-08 NOTE — Assessment & Plan Note (Addendum)
 Continue beta blocker.

## 2024-01-08 NOTE — Assessment & Plan Note (Deleted)
 Continue beta blocker.

## 2024-01-08 NOTE — Assessment & Plan Note (Deleted)
 S/p IM nail fixation of R intertrochanteric fracture on 10/31 Has completed STR at [facility] and is back in LTC at this time

## 2024-01-08 NOTE — Assessment & Plan Note (Deleted)
 Continue gabapentin  and oxycodone  I have reviewed this patient in the [place] Controlled Substances Reporting System.  She is receiving medications from only one provider and appears to be taking them as prescribed. She is not at particularly high risk of opioid misuse, diversion, or overdose.

## 2024-01-08 NOTE — Plan of Care (Signed)

## 2024-01-08 NOTE — Assessment & Plan Note (Addendum)
 Continue mirtazapine , buspirone , and melatonin

## 2024-01-08 NOTE — Assessment & Plan Note (Addendum)
 Would benefit from statin therapy

## 2024-01-08 NOTE — Assessment & Plan Note (Addendum)
 Continue apixaban  twice daily Continue carvedilol  twice daily for rate control - HR 97-109, currently 101

## 2024-01-08 NOTE — Assessment & Plan Note (Deleted)
 Last A1c was 5.7, good control Continue 4 units Lantus  qhs Cover with moderate-scale SSI Carb modified diet

## 2024-01-08 NOTE — [place] FL2 (Signed)
 " [place]  MEDICAID FL2 LEVEL OF CARE FORM     IDENTIFICATION  Patient Name: Andrea Kirk Birthdate: Jun 04, 1940 Sex: female Admission Date (Current Location): 01/06/2024  [place] and [place] Number:  [place] [id number] Facility and Address:  [facility],  [address], [place] [address]      Provider Number: [id number]  Attending Physician Name and Address:  Barbarann Nest, MD  Relative Name and Phone Number:  [phone]    Current Level of Care: Hospital Recommended Level of Care: Other (Comment) (LTC) Prior Approval Number:    Date Approved/Denied:   PASRR Number:    Discharge Plan: Other (Comment) (LTC)    Current Diagnoses: Patient Active Problem List   Diagnosis Date Noted   Acute on chronic hypoxic respiratory failure (HCC) 01/06/2024   Persistent atrial fibrillation (HCC) 01/06/2024   Abnormal gait 12/16/2023   Acquired hallux valgus 12/16/2023   Allergic rhinitis 12/16/2023   Anticoagulant long-term use 12/16/2023   Background diabetic retinopathy (HCC) 12/16/2023   Benign neoplasm of cranial nerve (HCC) 12/16/2023   Chronic maxillary sinusitis 12/16/2023   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 12/16/2023   Dyslipidemia 12/16/2023   Gastro-esophageal reflux disease without esophagitis 12/16/2023   History of colonic polyps 12/16/2023   Hypercalcemia 12/16/2023   Irritable bowel syndrome 12/16/2023   Left lower lobe pulmonary nodule 12/16/2023   Major depression, single episode 12/16/2023   Muscle pain 12/16/2023   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   Peripheral neuropathy 12/16/2023   Primary osteoarthritis involving multiple joints 12/16/2023   Pure hypercholesterolemia 12/16/2023   Recurrent major depression in remission 12/16/2023   Restless legs 12/16/2023   Sciatica 12/16/2023   Status post aorto-coronary artery bypass graft 12/16/2023   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/2023    Vitamin D  deficiency 12/16/2023   Abnormal transaminases 11/03/2023   High anion gap metabolic acidosis 11/03/2023   Uremia 11/03/2023   NSTEMI (non-ST elevated myocardial infarction) (HCC) 11/03/2023   Acute on chronic diastolic CHF (congestive heart failure) (HCC) 11/03/2023   Acute CVA (cerebrovascular accident) (HCC) 11/03/2023   Preop examination 10/22/2023   Fall 10/22/2023   Displaced intertrochanteric fracture of right femur, initial encounter for closed fracture (HCC) 10/21/2023   Chronic respiratory failure with hypoxia (HCC) 10/21/2023   Bilateral sensorineural hearing loss 07/25/2022   History of sepsis 2/2 UTI 05/28/2022   Nausea 02/17/2021   C. difficile colitis 02/15/2021   Elevated troponin 02/14/2021   Gram-negative bacteremia 02/14/2021   Thrombocytopenia 02/13/2021   Acute encephalopathy 02/13/2021   Anxiety and depression 02/13/2021   Closed fracture of acromial process of right scapula 11/21/2020   Syncope, vasovagal 06/23/2020   Pulmonary hypertension, unspecified (HCC) 04/20/2020   Controlled type 2 diabetes mellitus with hyperglycemia (HCC) 04/20/2020   Essential hypertension 04/20/2020   HLD (hyperlipidemia) 04/20/2020   CKD (chronic kidney disease) stage 2, GFR 60-89 ml/min 04/20/2020   Iron deficiency anemia 04/20/2020   Obese 04/19/2020   Chronic back pain 04/19/2020   hx of CVA (cerebral vascular accident) (HCC) 04/16/2020   Macular retinoschisis, 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   Vitreomacular traction syndrome, 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   Depression 12/22/2017   Chronic pain 12/22/2017   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   Chronic diastolic CHF (congestive heart failure) (HCC) 12/22/2017   Acute renal failure superimposed on stage 2 chronic kidney disease 11/07/2016   AKI (acute kidney injury) 08/04/2016   Frequent falls 07/16/2016   Carotid artery disease 04/16/2013   Coronary artery disease  03/11/2013   Occlusion and stenosis of carotid artery without  mention of cerebral infarction 07/19/2011    Orientation RESPIRATION BLADDER Height & Weight     Self, Time, Situation, Place  O2 Continent, Incontinent Weight: 82.2 kg Height:  5' 2 (157.5 cm)  BEHAVIORAL SYMPTOMS/MOOD NEUROLOGICAL BOWEL NUTRITION STATUS      Continent, Incontinent Diet (Heart Healthy)  AMBULATORY STATUS COMMUNICATION OF NEEDS Skin   Extensive Assist Verbally PU Stage and Appropriate Care (sacrum pressure injury-see d/c summary) PU Stage 1 Dressing: Daily                     Personal Care Assistance Level of Assistance  Bathing, Feeding, Dressing Bathing Assistance: Maximum assistance Feeding assistance: Maximum assistance Dressing Assistance: Maximum assistance     Functional Limitations Info  Sight, Hearing, Speech Sight Info: Impaired (eyeglasses) Hearing Info: Adequate Speech Info: Adequate    SPECIAL CARE FACTORS FREQUENCY                       Contractures Contractures Info: Not present    Additional Factors Info  Code Status, Allergies Code Status Info: Full Allergies Info: Latex, Codeine, Morphine  And Codeine, Cyclobenzaprine, Grapefruit Concentrate, Jardiance (Empagliflozin)           Current Medications (01/08/2024):  This is the current hospital active medication list Current Facility-Administered Medications  Medication Dose Route Frequency Provider Last Rate Last Admin   acetaminophen  (TYLENOL ) tablet 650 mg  650 mg Oral Q6H PRN Celinda Alm Lot, MD       Or   acetaminophen  (TYLENOL ) suppository 650 mg  650 mg Rectal Q6H PRN Celinda Alm Lot, MD       apixaban  (ELIQUIS ) tablet 5 mg  5 mg Oral BID Celinda Alm Lot, MD   5 mg at 01/08/24 0935   artificial tears ophthalmic solution 1 drop  1 drop Both Eyes Q4H PRN Celinda Alm Lot, MD       busPIRone  (BUSPAR ) tablet 10 mg  10 mg Oral BID Celinda Alm Lot, MD   10 mg at 01/08/24 9066   carvedilol  (COREG ) tablet 6.25 mg  6.25 mg Oral BID WC Celinda Alm Lot, MD    6.25 mg at 01/08/24 9180   Chlorhexidine  Gluconate Cloth 2 % PADS 6 each  6 each Topical Daily Shona Terry SAILOR, DO   6 each at 01/07/24 1000   citalopram  (CELEXA ) tablet 20 mg  20 mg Oral Daily Celinda Alm Lot, MD   20 mg at 01/08/24 9065   cyanocobalamin  (VITAMIN B12) tablet 1,000 mcg  1,000 mcg Oral Daily Celinda Alm Lot, MD   1,000 mcg at 01/08/24 9065   docusate sodium  (COLACE) capsule 100 mg  100 mg Oral QHS Celinda Alm Lot, MD   100 mg at 01/07/24 2045   furosemide  (LASIX ) injection 20 mg  20 mg Intravenous BID Celinda Alm Lot, MD   20 mg at 01/08/24 9180   gabapentin  (NEURONTIN ) capsule 300 mg  300 mg Oral BID Celinda Alm Lot, MD   300 mg at 01/08/24 0935   insulin  aspart (novoLOG ) injection 0-15 Units  0-15 Units Subcutaneous TID WC Celinda Alm Lot, MD   3 Units at 01/08/24 0818   insulin  glargine-yfgn (SEMGLEE ) injection 5 Units  5 Units Subcutaneous QHS Celinda Alm Lot, MD   5 Units at 01/07/24 2049   magnesium  oxide (MAG-OX) tablet 400 mg  400 mg Oral Daily Celinda Alm Lot, MD   400 mg  at 01/08/24 0935   melatonin tablet 3 mg  3 mg Oral QHS Celinda Alm Lot, MD   3 mg at 01/07/24 2045   mirtazapine  (REMERON ) tablet 7.5 mg  7.5 mg Oral QHS Celinda Alm Lot, MD   7.5 mg at 01/07/24 2045   ondansetron  (ZOFRAN ) injection 4 mg  4 mg Intravenous Once Long, Joshua G, MD       ondansetron  (ZOFRAN ) tablet 4 mg  4 mg Oral Q6H PRN Celinda Alm Lot, MD       Or   ondansetron  (ZOFRAN ) injection 4 mg  4 mg Intravenous Q6H PRN Celinda Alm Lot, MD       oxybutynin  (DITROPAN -XL) 24 hr tablet 10 mg  10 mg Oral Daily Celinda Alm Lot, MD   10 mg at 01/08/24 9065   oxyCODONE  (Oxy IR/ROXICODONE ) immediate release tablet 5 mg  5 mg Oral Q6H PRN Celinda Alm Lot, MD   5 mg at 01/08/24 9065   pantoprazole  (PROTONIX ) EC tablet 40 mg  40 mg Oral BID Celinda Alm Lot, MD   40 mg at 01/08/24 9065   polyethylene glycol (MIRALAX  / GLYCOLAX ) packet 17 g  17 g Oral  Daily PRN Celinda Alm Lot, MD       saccharomyces boulardii [facility]) capsule 250 mg  250 mg Oral Daily Celinda Alm Lot, MD   250 mg at 01/08/24 9065   senna (SENOKOT) tablet 17.2 mg  2 tablet Oral QHS Celinda Alm Lot, MD   17.2 mg at 01/06/24 2052     Discharge Medications: Please see discharge summary for a list of discharge medications.  Relevant Imaging Results:  Relevant Lab Results:   Additional Information [phone]  Hansford Hirt, Nathanel, RN     "

## 2024-01-09 DIAGNOSIS — J9621 Acute and chronic respiratory failure with hypoxia: Secondary | ICD-10-CM | POA: Diagnosis not present

## 2024-01-09 LAB — BLOOD GAS, VENOUS
Acid-Base Excess: 21.1 mmol/L — ABNORMAL HIGH (ref 0.0–2.0)
Bicarbonate: 47.2 mmol/L — ABNORMAL HIGH (ref 20.0–28.0)
Drawn by: 355291
O2 Saturation: 63.4 %
Patient temperature: 36.9
pCO2, Ven: 62 mmHg — ABNORMAL HIGH (ref 44–60)
pH, Ven: 7.49 — ABNORMAL HIGH (ref 7.25–7.43)
pO2, Ven: 37 mmHg (ref 32–45)

## 2024-01-09 LAB — GLUCOSE, CAPILLARY
Glucose-Capillary: 239 mg/dL — ABNORMAL HIGH (ref 70–99)
Glucose-Capillary: 218 mg/dL — ABNORMAL HIGH (ref 70–99)
Glucose-Capillary: 250 mg/dL — ABNORMAL HIGH (ref 70–99)
Glucose-Capillary: 278 mg/dL — ABNORMAL HIGH (ref 70–99)

## 2024-01-09 NOTE — Assessment & Plan Note (Deleted)
 Continue mirtazapine , buspirone , and melatonin

## 2024-01-09 NOTE — Assessment & Plan Note (Addendum)
 Patient is on 2L home O2 Presented with O2 sats in the 50s at SNF Elevated BNP, mildly elevated troponin - appears to be c/w CHF exacerbation Last echo was in 10/2023 and showed preserved EF (actually hyperdynamic) and grade 2 diastolic dysfunction Continue furosemide  20 mg IVP twice daily Monitor daily weights, intake and output Repeat echocardiogram limited showed EF 50-55% with RV volume overload CTA negative for PE Had episodes of O2 sats in the 60s yesterday with apnea Appears to be more stable today, back on 2L [place] O2 Will continue to monitor for 1 additional day

## 2024-01-09 NOTE — Assessment & Plan Note (Deleted)
 Would benefit from statin therapy

## 2024-01-09 NOTE — Assessment & Plan Note (Addendum)
 Would benefit from statin therapy

## 2024-01-09 NOTE — Assessment & Plan Note (Deleted)
 Continue beta blocker.

## 2024-01-09 NOTE — Assessment & Plan Note (Deleted)
 Patient is on 2L home O2 Presented with O2 sats in the 50s at SNF Elevated BNP, mildly elevated troponin - appears to be c/w CHF exacerbation Last echo was in 10/2023 and showed preserved EF (actually hyperdynamic) and grade 2 diastolic dysfunction Continue furosemide  20 mg IVP twice daily Monitor daily weights, intake and output Repeat echocardiogram limited showed EF 50-55% with RV volume overload Will order CTA to evaluate for PE She is on 3L at this time, dropped into the 60s on 2L with sleeping and appears to have some sleep apnea per nursing staff

## 2024-01-09 NOTE — Assessment & Plan Note (Addendum)
"   Continue PPI  "

## 2024-01-09 NOTE — Assessment & Plan Note (Addendum)
 S/p IM nail fixation of R intertrochanteric fracture on 10/31 Has completed STR at [facility] and is back in LTC at this time

## 2024-01-09 NOTE — Assessment & Plan Note (Deleted)
"   Continue PPI  "

## 2024-01-09 NOTE — Assessment & Plan Note (Deleted)
 Last A1c was 5.7, good control Continue 4 units Lantus  qhs Cover with moderate-scale SSI Carb modified diet

## 2024-01-09 NOTE — Plan of Care (Signed)

## 2024-01-09 NOTE — Assessment & Plan Note (Addendum)
 Continue gabapentin  and oxycodone  I have reviewed this patient in the [place] Controlled Substances Reporting System.  She is receiving medications from only one provider and appears to be taking them as prescribed. She is not at particularly high risk of opioid misuse, diversion, or overdose.

## 2024-01-09 NOTE — Assessment & Plan Note (Addendum)
 Wound 01/07/24 2018 Pressure Injury Sacrum Left;Right Deep Tissue Pressure Injury - Purple or maroon localized area of discolored intact skin or blood-filled blister due to damage of underlying soft tissue from pressure and/or shear. (Active)

## 2024-01-09 NOTE — Assessment & Plan Note (Deleted)
 Continue gabapentin  and oxycodone  I have reviewed this patient in the [place] Controlled Substances Reporting System.  She is receiving medications from only one provider and appears to be taking them as prescribed. She is not at particularly high risk of opioid misuse, diversion, or overdose.

## 2024-01-09 NOTE — Evaluation (Signed)
 Physical Therapy Evaluation Patient Details Name: Andrea Kirk MRN: [id number] DOB: Jan 02, 1941 Today's Date: 01/09/2024  History of Present Illness  Andrea Kirk is an 84 yo who presented on 12/30 with SOB.  O2 sats were in the 50s on presentation.  Troponin 25, 27.  Pro-BNP 1832.  CXR with mild pulmonary edema and small to moderate B effusions.  Given IV Lasix  and started on supplemental O2 for CHF exacerbation PMH: h/o CVA, depression/anxiety, CAD s/p CABG, HTN, T2DM, R hip fx/ORIF and HLD  Clinical Impression  Pt admitted with above diagnosis. Pt is from LTC at [facility] but has been working with therapy since recent R hip fx.  Today, pt self limiting and declined sitting EOB due to wanting to safe her energy for return to [facility] tomorrow, she did agree to bed exercises with encouragement.  Noting that pt was +2 assist with OT yesterday.  Recommend return to SNF/LTC with ongoing therapy as she was prior to admission.  Pt currently with functional limitations due to the deficits listed below (see PT Problem List). Pt will benefit from acute skilled PT to increase their independence and safety with mobility to allow discharge.           If plan is discharge home, recommend the following: Two people to help with walking and/or transfers;Two people to help with bathing/dressing/bathroom   Can travel by private vehicle        Equipment Recommendations None recommended by PT  Recommendations for Other Services       Functional Status Assessment Patient has had a recent decline in their functional status and demonstrates the ability to make significant improvements in function in a reasonable and predictable amount of time.     Precautions / Restrictions Precautions Precautions: Fall Precaution/Restrictions Comments: watch O2/HR      Mobility  Bed Mobility               General bed mobility comments: Pt refused - reports I like my therapy at [facility] and want to safe my  energy for that.  Encouraged to participate some to help to maintain the strength she has.  She agreed to bed exercise    Transfers                        Ambulation/Gait                  Stairs            Wheelchair Mobility     Tilt Bed    Modified Rankin (Stroke Patients Only)       Balance Overall balance assessment: Needs assistance     Sitting balance - Comments: Declined - did sit without UE support with OT yesterday                                     Pertinent Vitals/Pain Pain Assessment Pain Assessment: Faces Faces Pain Scale: Hurts little more Pain Location: B knees Pain Descriptors / Indicators: Discomfort, Sore Pain Intervention(s): Limited activity within patient's tolerance, Monitored during session    Home Living Family/patient expects to be discharged to:: Skilled nursing facility                   Additional Comments: LTC at [facility], 3 years    Prior Function Prior Level of Function : Needs assist;History of Falls (last six  months)             Mobility Comments: requires assist with all transfers to w/c with PFRW, has not been amb except with therapy in parallel bars since hip fx, 2 ltrs O2 at baseline ADLs Comments: assist for all LB ADL's, uses brief for toileting, sponge bathes     Extremity/Trunk Assessment   Upper Extremity Assessment Upper Extremity Assessment: Defer to OT evaluation    Lower Extremity Assessment Lower Extremity Assessment: LLE deficits/detail;RLE deficits/detail RLE Deficits / Details: Tested in supine: ROM knee and hip limited ; MMT: at least 3/5 ankle and 2/5 hip and knee LLE Deficits / Details: Tested in supine; ROM WFL; MMT appears at least 3/5 throughout    Cervical / Trunk Assessment Cervical / Trunk Assessment: Kyphotic  Communication   Communication Factors Affecting Communication: Hearing impaired    Cognition Arousal: Alert Behavior During  Therapy: Anxious   PT - Cognitive impairments: No apparent impairments                                 Cueing       General Comments General comments (skin integrity, edema, etc.): Pt on 2 L O2 with sats 95% today    Exercises General Exercises - Lower Extremity Ankle Circles/Pumps: AROM, Both, 5 reps, Supine Quad Sets: AROM, Both, 5 reps, Supine Heel Slides: AROM, Left, AAROM, Right, 5 reps, Supine Hip ABduction/ADduction: AROM, Left, AAROM, Right, 5 reps, Supine Other Exercises Other Exercises: chest press and shoulder flexion x 5 holding pillow for weight   Assessment/Plan    PT Assessment Patient needs continued PT services  PT Problem List Decreased strength;Pain;Cardiopulmonary status limiting activity;Decreased activity tolerance;Decreased balance;Decreased mobility;Decreased knowledge of use of DME;Decreased range of motion       PT Treatment Interventions DME instruction;Functional mobility training;Therapeutic activities;Patient/family education;Modalities;Balance training;Therapeutic exercise    PT Goals (Current goals can be found in the Care Plan section)  Acute Rehab PT Goals Patient Stated Goal: return to [facility] PT Goal Formulation: With patient Time For Goal Achievement: 01/23/24 Potential to Achieve Goals: Fair    Frequency Min 1X/week     Co-evaluation               AM-PAC PT 6 Clicks Mobility  Outcome Measure Help needed turning from your back to your side while in a flat bed without using bedrails?: Total Help needed moving from lying on your back to sitting on the side of a flat bed without using bedrails?: Total Help needed moving to and from a bed to a chair (including a wheelchair)?: Total Help needed standing up from a chair using your arms (e.g., wheelchair or bedside chair)?: Total Help needed to walk in hospital room?: Total Help needed climbing 3-5 steps with a railing? : Total 6 Click Score: 6    End of  Session Equipment Utilized During Treatment: Oxygen  Activity Tolerance: Other (comment) (self limiting) Patient left: in bed;with call bell/phone within reach;with bed alarm set Nurse Communication: Mobility status PT Visit Diagnosis: Other abnormalities of gait and mobility (R26.89);Muscle weakness (generalized) (M62.81)    Time: 8390-8380 PT Time Calculation (min) (ACUTE ONLY): 10 min   Charges:   PT Evaluation $PT Eval Low Complexity: 1 Low   PT General Charges $$ ACUTE PT VISIT: 1 Visit         Benjiman, PT Acute Rehab [facility] [phone]   [name] 01/09/2024,  5:39 PM

## 2024-01-09 NOTE — Assessment & Plan Note (Addendum)
 Hold amlodipine  Continue carvedilol  6.25 mg p.o. twice daily

## 2024-01-09 NOTE — Assessment & Plan Note (Deleted)
 S/p IM nail fixation of R intertrochanteric fracture on 10/31 Has completed STR at [facility] and is back in LTC at this time

## 2024-01-09 NOTE — Progress Notes (Signed)
 MD notified that cardiac monitoring called RN and said that patient's O2 sats dropped down to 58%. RN upped patient's oxygen  to 3 liters with sats at 97%. When RN checked patient's O2 sats at 2 liters, patient's O2 sats was hanging around 88%.

## 2024-01-09 NOTE — Assessment & Plan Note (Addendum)
 Continue apixaban  twice daily Continue carvedilol  twice daily for rate control - HR 97-109, currently 101

## 2024-01-09 NOTE — Assessment & Plan Note (Addendum)
 Continue beta blocker.

## 2024-01-09 NOTE — Assessment & Plan Note (Deleted)
 Continue apixaban  twice daily Continue carvedilol  twice daily for rate control - HR 97-109, currently 101

## 2024-01-09 NOTE — Assessment & Plan Note (Addendum)
 Last A1c was 5.7, good control Continue 4 units Lantus  qhs Cover with moderate-scale SSI Carb modified diet

## 2024-01-09 NOTE — Progress Notes (Signed)
 Notified the provider Lavanda Horns, about the pt being more lethargic, along with the issues with desating earlier in the day. Advised we have not checked an ABG on this admission. Pt vss stable, no desating, currently on venti mask at 6 liters, pt is arousable, but hard to stay awake. Md placed an abg order for 0500.

## 2024-01-09 NOTE — Assessment & Plan Note (Deleted)
 Hold amlodipine  Continue carvedilol  6.25 mg p.o. twice daily

## 2024-01-09 NOTE — Assessment & Plan Note (Addendum)
 Continue mirtazapine , buspirone , and melatonin

## 2024-01-09 NOTE — Assessment & Plan Note (Deleted)
 Wound 01/07/24 2018 Pressure Injury Sacrum Left;Right Deep Tissue Pressure Injury - Purple or maroon localized area of discolored intact skin or blood-filled blister due to damage of underlying soft tissue from pressure and/or shear. (Active)

## 2024-01-09 NOTE — Progress Notes (Signed)
 " Progress Note   Patient: Andrea Kirk FMW:[id number] DOB: April 13, 1940 DOA: 01/06/2024     3 DOS: the patient was seen and examined on 01/09/2024   Brief hospital course: 84yo with h/o CVA, depression/anxiety, CAD s/p CABG, HTN, T2DM, and HLD who presented on 12/30 with SOB. O2 sats were in the 50s on presentation. Troponin 25, 27. Pro-BNP 1832. CXR with mild pulmonary edema and small to moderate B effusions. Given IV Lasix  and started on supplemental O2 for CHF exacerbation.     Assessment & Plan Acute on chronic hypoxic respiratory failure (HCC) Acute on chronic diastolic CHF (congestive heart failure) (HCC) Patient is on 2L home O2 Presented with O2 sats in the 50s at SNF Elevated BNP, mildly elevated troponin - appears to be c/w CHF exacerbation Last echo was in 10/2023 and showed preserved EF (actually hyperdynamic) and grade 2 diastolic dysfunction Continue furosemide  20 mg IVP twice daily Monitor daily weights, intake and output Repeat echocardiogram limited showed EF 50-55% with RV volume overload CTA negative for PE Had episodes of O2 sats in the 60s yesterday with apnea Appears to be more stable today, back on 2L [place] O2 Will continue to monitor for 1 additional day Coronary artery disease Continue beta-blocker Controlled type 2 diabetes mellitus with hyperglycemia (HCC) Last A1c was 5.7, good control Continue 4 units Lantus  qhs Cover with moderate-scale SSI Carb modified diet  Essential hypertension Hold amlodipine  Continue carvedilol  6.25 mg p.o. twice daily HLD (hyperlipidemia) Would benefit from statin therapy Anxiety and depression Continue mirtazapine , buspirone , and melatonin  Gastro-esophageal reflux disease without esophagitis Continue PPI Peripheral neuropathy Continue gabapentin  and oxycodone  I have reviewed this patient in the [place] Controlled Substances Reporting System.  She is receiving medications from only one provider and appears to be taking them as  prescribed. She is not at particularly high risk of opioid misuse, diversion, or overdose.  Persistent atrial fibrillation (HCC) Continue apixaban  twice daily Continue carvedilol  twice daily for rate control - HR 97-109, currently 101  History of hip fracture S/p IM nail fixation of R intertrochanteric fracture on 10/31 Has completed STR at [facility] and is back in LTC at this time Pressure injury Wound 01/07/24 2018 Pressure Injury Sacrum Left;Right Deep Tissue Pressure Injury - Purple or maroon localized area of discolored intact skin or blood-filled blister due to damage of underlying soft tissue from pressure and/or shear. (Active)        Consultants: None   Procedures: Echocardiogram 1/1   Antibiotics: None   30 Day Unplanned Readmission Risk Score    Flowsheet Row ED to Hosp-Admission (Current) from 01/06/2024 in [facility] 4TH FLOOR PROGRESSIVE CARE AND UROLOGY  30 Day Unplanned Readmission Risk Score (%) 25.42 Filed at 01/09/2024 1200    This score is the patient's risk of an unplanned readmission within 30 days of being discharged (0 -100%). The score is based on dignosis, age, lab data, medications, orders, and past utilization.   Low:  0-14.9   Medium: 15-21.9   High: 22-29.9   Extreme: 30 and above           Subjective: Feeling ok today.  No specific complaints but she would like to remain hospitalized for 1 more day for monitoring.   Objective: Vitals:   01/09/24 1028 01/09/24 1044  BP:    Pulse:    Resp:    Temp:    SpO2: 98% 100%    Intake/Output Summary (Last 24 hours) at 01/09/2024 1213 Last data filed at  01/09/2024 1020 Gross per 24 hour  Intake 480 ml  Output 2850 ml  Net -2370 ml   Filed Weights   01/07/24 2028 01/08/24 0500 01/09/24 0500  Weight: 84.2 kg 82.2 kg 81.5 kg    Exam:  General:  Appears calm and comfortable and is in NAD, now back on 2L [place] O2 Eyes:  normal lids, iris ENT:  grossly normal hearing, lips & tongue,  mmm Cardiovascular:  RRR. No LE edema.  Respiratory:   CTA bilaterally with no wheezes/rales/rhonchi.  Normal respiratory effort. Abdomen:  soft, NT, ND Skin:  no rash or induration seen on limited exam Musculoskeletal:  grossly normal tone BUE/BLE, good ROM, no bony abnormality Psychiatric:  blunted mood and affect, speech fluent and appropriate, AOx3 Neurologic:  CN 2-12 grossly intact, moves all extremities in coordinated fashion  Data Reviewed: I have reviewed the patient's lab results since admission.  Pertinent labs for today include:   ABG: 7.49/62/47.2      Family Communication: None present  Mobility: PT/OT Consulted      Code Status: Full Code    Disposition: Status is: Inpatient Remains inpatient appropriate because: ongoing monitoring     Time spent: 50 minutes  Unresulted Labs (From admission, onward)    None        Author: Delon Herald, MD 01/09/2024 12:13 PM  For on call review [email or web address].            "

## 2024-01-10 DIAGNOSIS — J9621 Acute and chronic respiratory failure with hypoxia: Secondary | ICD-10-CM | POA: Diagnosis not present

## 2024-01-10 DIAGNOSIS — G4730 Sleep apnea, unspecified: Secondary | ICD-10-CM | POA: Diagnosis present

## 2024-01-10 DIAGNOSIS — J9622 Acute and chronic respiratory failure with hypercapnia: Secondary | ICD-10-CM | POA: Diagnosis not present

## 2024-01-10 LAB — GLUCOSE, CAPILLARY
Glucose-Capillary: 211 mg/dL — ABNORMAL HIGH (ref 70–99)
Glucose-Capillary: 218 mg/dL — ABNORMAL HIGH (ref 70–99)
Glucose-Capillary: 237 mg/dL — ABNORMAL HIGH (ref 70–99)
Glucose-Capillary: 283 mg/dL — ABNORMAL HIGH (ref 70–99)
Glucose-Capillary: 314 mg/dL — ABNORMAL HIGH (ref 70–99)
Glucose-Capillary: 342 mg/dL — ABNORMAL HIGH (ref 70–99)

## 2024-01-10 MED ORDER — ACETAMINOPHEN 325 MG PO TABS: 650.0000 mg | ORAL_TABLET | Freq: Four times a day (QID) | ORAL | Status: AC | PRN

## 2024-01-10 MED ORDER — ZINC OXIDE 40 % EX OINT: TOPICAL_OINTMENT | Freq: Two times a day (BID) | CUTANEOUS | Status: AC

## 2024-01-10 MED ORDER — POLYVINYL ALCOHOL 1.4 % OP SOLN: 1.0000 [drp] | OPHTHALMIC | Status: AC | PRN

## 2024-01-10 NOTE — Assessment & Plan Note (Addendum)
 Wound 01/07/24 2018 Pressure Injury Sacrum Left;Right Deep Tissue Pressure Injury - Purple or maroon localized area of discolored intact skin or blood-filled blister due to damage of underlying soft tissue from pressure and/or shear. (Active)

## 2024-01-10 NOTE — Assessment & Plan Note (Addendum)
 She has continued to have periodic precipitous drops in her O2 levels with sleep and appears to have apnea ABG with chronic hypercapnic respiratory failure, qualifying her for home BIPAP She will need an outpatient sleep study

## 2024-01-10 NOTE — Assessment & Plan Note (Addendum)
 Hold amlodipine  Continue carvedilol  6.25 mg p.o. twice daily

## 2024-01-10 NOTE — Assessment & Plan Note (Addendum)
 Continue apixaban  twice daily Continue carvedilol  twice daily for rate control - HR 97-109, currently 101

## 2024-01-10 NOTE — Assessment & Plan Note (Addendum)
 Continue gabapentin  and oxycodone  I have reviewed this patient in the [place] Controlled Substances Reporting System.  She is receiving medications from only one provider and appears to be taking them as prescribed. She is not at particularly high risk of opioid misuse, diversion, or overdose.

## 2024-01-10 NOTE — Assessment & Plan Note (Addendum)
 Would benefit from statin therapy

## 2024-01-10 NOTE — Progress Notes (Signed)
" °   01/10/24 2203  BiPAP/CPAP/SIPAP  BiPAP/CPAP/SIPAP Pt Type Adult  BiPAP/CPAP/SIPAP DREAMSTATIOND  Mask Type Full face mask  Dentures removed? Not applicable  Mask Size Medium  Pressure Support (S)   (min 4 max 8)  Flow Rate 3 lpm  Patient Home Machine No  Patient Home Mask No  Patient Home Tubing No  Auto Titrate (S)  Yes  Minimum cmH2O (S)  5 cmH2O  Maximum cmH2O (S)  25 cmH2O  Device Plugged into RED Power Outlet Yes  BiPAP/CPAP /SiPAP Vitals  Pulse Rate 85  Resp 18  SpO2 100 %  Bilateral Breath Sounds Diminished  MEWS Score/Color  MEWS Score 0  MEWS Score Color Green    "

## 2024-01-10 NOTE — Plan of Care (Signed)

## 2024-01-10 NOTE — Assessment & Plan Note (Addendum)
 Last A1c was 5.7, good control Continue 4 units Lantus  qhs Cover with moderate-scale SSI Carb modified diet

## 2024-01-10 NOTE — Plan of Care (Signed)
" °  Problem: Coping: Goal: Ability to adjust to condition or change in health will improve Outcome: Progressing   Problem: Fluid Volume: Goal: Ability to maintain a balanced intake and output will improve Outcome: Progressing   Problem: Nutritional: Goal: Maintenance of adequate nutrition will improve Outcome: Progressing   Problem: Education: Goal: Knowledge of General Education information will improve Description: Including pain rating scale, medication(s)/side effects and non-pharmacologic comfort measures Outcome: Progressing   Problem: Clinical Measurements: Goal: Will remain free from infection Outcome: Progressing Goal: Cardiovascular complication will be avoided Outcome: Progressing   Problem: Elimination: Goal: Will not experience complications related to bowel motility Outcome: Progressing Goal: Will not experience complications related to urinary retention Outcome: Progressing   "

## 2024-01-10 NOTE — Assessment & Plan Note (Addendum)
 Patient is on 2L home O2 Presented with O2 sats in the 50s at SNF Elevated BNP, mildly elevated troponin - likely some component of CHF exacerbation Last echo was in 10/2023 and showed preserved EF (actually hyperdynamic) and grade 2 diastolic dysfunction Repeat echocardiogram limited showed EF 50-55% with RV volume overload which may be related to CHF or could be from underlying lung disease CTA negative for PE She has continued to have episodes of O2 sats in the 60s with apnea ABG shows pCO2 62, possibly related to COPD and/or sleep disordered breathing Will dc back to LTC with BIPAP (vs. Non-invasive ventilator depending on insurance) Discussed with Dr. Shelah, who agrees

## 2024-01-10 NOTE — TOC Progression Note (Signed)
 Transition of Care [facility]) - Progression Note    Patient Details  Name: Andrea Kirk MRN: [id number] Date of Birth: 1941-01-06  [facility] CM/SW Contact  Jon ONEIDA Anon, RN Phone Number: 01/10/2024, 1:00 PM  Clinical Narrative:    Pt medically stable to discharge back to LTC facility. Pt needing Bipap at discharg, not baseline. RNCM spoke with Shona, Admissions Coordinator for [place] and she states she will not be able to order Bipap until Monday. Pt will be able to return to LTC facility on Monday once Bipap ordered. ICM continuing to follow for DC planning needs.   Expected Discharge Plan: Long Term Nursing Home Barriers to Discharge: No Barriers Identified               Expected Discharge Plan and Services   Discharge Planning Services: CM Consult Post Acute Care Choice: Nursing Home Living arrangements for the past 2 months:  ([facility] home) Expected Discharge Date: 01/10/24                                     Social Drivers of Health (SDOH) Interventions SDOH Screenings   Food Insecurity: No Food Insecurity (01/07/2024)  Housing: Low Risk (01/07/2024)  Transportation Needs: No Transportation Needs (01/07/2024)  Utilities: Not At Risk (01/07/2024)  Social Connections: Unknown (01/07/2024)  Tobacco Use: Low Risk (01/06/2024)    Readmission Risk Interventions     No data to display

## 2024-01-10 NOTE — Assessment & Plan Note (Addendum)
 Continue mirtazapine , buspirone , and melatonin

## 2024-01-10 NOTE — Progress Notes (Signed)
" °   01/10/24 2146  BiPAP/CPAP/SIPAP  BiPAP/CPAP/SIPAP Pt Type Adult  BiPAP/CPAP/SIPAP DREAMSTATIOND  Mask Type Full face mask  Dentures removed? Not applicable  Mask Size Medium  IPAP 12 cmH20  EPAP 5 cmH2O  Flow Rate 3 lpm  Patient Home Machine No  Patient Home Mask No  Patient Home Tubing No  Auto Titrate No  CPAP/SIPAP surface wiped down Yes  Device Plugged into RED Power Outlet Yes  BiPAP/CPAP /SiPAP Vitals  Pulse Rate 82  Resp 17  SpO2 100 %  Bilateral Breath Sounds Diminished  MEWS Score/Color  MEWS Score 0  MEWS Score Color Green    "

## 2024-01-10 NOTE — Assessment & Plan Note (Addendum)
 Continue  carvedill

## 2024-01-10 NOTE — Assessment & Plan Note (Addendum)
"  Continue PPI  "

## 2024-01-10 NOTE — Assessment & Plan Note (Addendum)
 S/p IM nail fixation of R intertrochanteric fracture on 10/31 Has completed STR at [facility] and is back in LTC at this time

## 2024-01-10 NOTE — Discharge Summary (Addendum)
 " Physician Discharge Summary   Patient: Andrea Kirk MRN: [id number] DOB: 1940-03-02  Admit date:     01/06/2024  Discharge date: 01/10/2024  Discharge Physician: Delon Herald   PCP: System, Provider Not In   Recommendations at discharge:   You are being discharged back to long-term care at [facility] BIPAP has been ordered for apnea and should be worn for sleep (including naps) An outpatient sleep study is recommended Stop amlodipine   Discharge Diagnoses: Principal Problem:   Acute on chronic respiratory failure with hypoxia and hypercapnia (HCC) Active Problems:   Coronary artery disease   Controlled type 2 diabetes mellitus with hyperglycemia (HCC)   Essential hypertension   HLD (hyperlipidemia)   Anxiety and depression   Acute on chronic diastolic CHF (congestive heart failure) (HCC)   Gastro-esophageal reflux disease without esophagitis   Peripheral neuropathy   Persistent atrial fibrillation (HCC)   History of hip fracture   Pressure injury   Sleep apnea   *Addendum: Family cannot accept patient until 1/5 due to new need for BIPAP.  It is ordered and will be arranged with plan for dc on Monday.    Hospital Course: 84yo with h/o CVA, depression/anxiety, CAD s/p CABG, HTN, T2DM, and HLD who presented on 12/30 with SOB. O2 sats were in the 50s on presentation. Troponin 25, 27. Pro-BNP 1832. CXR with mild pulmonary edema and small to moderate B effusions. Given IV Lasix  and started on supplemental O2 for CHF exacerbation.   Assessment and Plan:  Assessment & Plan Acute on chronic respiratory failure with hypoxia and hypercapnia (HCC) Patient is on 2L home O2 Presented with O2 sats in the 50s at SNF Elevated BNP, mildly elevated troponin - likely some component of CHF exacerbation Last echo was in 10/2023 and showed preserved EF (actually hyperdynamic) and grade 2 diastolic dysfunction Repeat echocardiogram limited showed EF 50-55% with RV volume overload which may  be related to CHF or could be from underlying lung disease CTA negative for PE She has continued to have episodes of O2 sats in the 60s with apnea ABG shows pCO2 62, possibly related to COPD and/or sleep disordered breathing Will dc back to LTC with BIPAP (vs. Non-invasive ventilator depending on insurance) Discussed with Dr. Shelah, who agrees Acute on chronic diastolic CHF (congestive heart failure) (HCC) Hypoxia with elevated BNP, mildly elevated troponin on presentation - appears to be c/w CHF exacerbation Treated with furosemide  20 mg IVP twice daily Repeat echocardiogram limited showed EF 50-55% with RV volume overload Sleep apnea She has continued to have periodic precipitous drops in her O2 levels with sleep and appears to have apnea ABG with chronic hypercapnic respiratory failure, qualifying her for home BIPAP She will need an outpatient sleep study Coronary artery disease Continue  carvedill Controlled type 2 diabetes mellitus with hyperglycemia (HCC) Last A1c was 5.7, good control Continue 4 units Lantus  qhs Cover with moderate-scale SSI Carb modified diet  Essential hypertension Hold amlodipine  Continue carvedilol  6.25 mg p.o. twice daily HLD (hyperlipidemia) Would benefit from statin therapy Anxiety and depression Continue mirtazapine , buspirone , and melatonin  Gastro-esophageal reflux disease without esophagitis Continue PPI Peripheral neuropathy Continue gabapentin  and oxycodone  I have reviewed this patient in the [place] Controlled Substances Reporting System.  She is receiving medications from only one provider and appears to be taking them as prescribed. She is not at particularly high risk of opioid misuse, diversion, or overdose.  Persistent atrial fibrillation (HCC) Continue apixaban  twice daily Continue carvedilol  twice daily for rate  control - HR 97-109, currently 101  History of hip fracture S/p IM nail fixation of R intertrochanteric fracture on 10/31 Has  completed STR at [facility] and is back in LTC at this time Pressure injury Wound 01/07/24 2018 Pressure Injury Sacrum Left;Right Deep Tissue Pressure Injury - Purple or maroon localized area of discolored intact skin or blood-filled blister due to damage of underlying soft tissue from pressure and/or shear. (Active)       Consultants: None   Procedures: Echocardiogram 1/1   Antibiotics: None    Pain control - [place]  Controlled Substance Reporting System database was reviewed. and patient was instructed, not to drive, operate heavy machinery, perform activities at heights, swimming or participation in water activities or provide baby-sitting services while on Pain, Sleep and Anxiety Medications; until their outpatient Physician has advised to do so again. Also recommended to not to take more than prescribed Pain, Sleep and Anxiety Medications.   Disposition: Skilled nursing facility Diet recommendation:  Cardiac and Carb modified diet DISCHARGE MEDICATION: Allergies as of 01/10/2024       Reactions   Latex Other (See Comments), Rash, Hives   tears skin   Codeine Other (See Comments)   Abnormal behavior   Morphine  And Codeine Other (See Comments)   Affects BP and blood sugar   Cyclobenzaprine Other (See Comments)   MADE PT SICK- pt unsure if med was cyclobenzaprine or methocarbamol    Grapefruit Concentrate Other (See Comments)   Allergic, per [facility]   Jardiance [empagliflozin] Other (See Comments)   Allergic, per [facility]        Medication List     STOP taking these medications    amLODipine  10 MG tablet Commonly known as: NORVASC    glipiZIDE  5 MG 24 hr tablet Commonly known as: GLUCOTROL  XL       TAKE these medications    acetaminophen  325 MG tablet Commonly known as: TYLENOL  Take 2 tablets (650 mg total) by mouth every 6 (six) hours as needed for mild pain (pain score 1-3) or fever (or Fever >/= 101).   albuterol  (2.5 MG/3ML) 0.083% nebulizer  solution Commonly known as: PROVENTIL  Inhale 3 mLs (2.5 mg total) into the lungs every 6 (six) hours as needed for wheezing or shortness of breath.   apixaban  5 MG Tabs tablet Commonly known as: ELIQUIS  Take 1 tablet (5 mg total) by mouth 2 (two) times daily.   artificial tears ophthalmic solution Place 1 drop into both eyes every 4 (four) hours as needed for dry eyes.   bisacodyl  10 MG suppository Commonly known as: DULCOLAX Place 1 suppository (10 mg total) rectally daily as needed for severe constipation. What changed: reasons to take this   busPIRone  10 MG tablet Commonly known as: BUSPAR  Take 1 tablet (10 mg total) by mouth 2 (two) times daily. What changed: when to take this   calcium  carbonate 750 MG chewable tablet Commonly known as: TUMS EX Chew 1 tablet by mouth daily as needed for heartburn.   Calcium  600+D 600-20 MG-MCG Tabs Generic drug: Calcium  Carb-Cholecalciferol  Take 1 tablet by mouth in the morning and at bedtime.   calcium -vitamin D  500-5 MG-MCG tablet Commonly known as: OSCAL WITH D Take 1 tablet by mouth 2 (two) times daily with a meal.   carvedilol  6.25 MG tablet Commonly known as: COREG  Take 6.25 mg by mouth in the morning and at bedtime.   citalopram  20 MG tablet Commonly known as: CELEXA  Take 20 mg by mouth daily.   cyanocobalamin  1000  MCG tablet Take 1,000 mcg by mouth daily.   docusate sodium  100 MG capsule Commonly known as: COLACE Take 100 mg by mouth in the morning and at bedtime.   estradiol 0.01 % Crea vaginal cream Commonly known as: ESTRACE Place 1 g vaginally daily.   ferrous sulfate  325 (65 FE) MG tablet Take 325 mg by mouth every Monday, Tuesday, Wednesday, Thursday, and Friday.   furosemide  20 MG tablet Commonly known as: LASIX  Take 1 tablet (20 mg total) by mouth daily as needed for fluid or edema. take once daily as needed for weight gain of 2-3 lbs overnight or 5 lbs in 1 week. What changed:  reasons to take  this additional instructions   gabapentin  300 MG capsule Commonly known as: NEURONTIN  Take 1 capsule (300 mg total) by mouth 2 (two) times daily. What changed: when to take this   insulin  aspart 100 UNIT/ML injection Commonly known as: novoLOG  Inject 0-15 Units into the skin 3 (three) times daily with meals. 0-15 Units, Subcutaneous, 3 times daily with meals, First dose on Mon 11/03/23 at 1700 Correction coverage: Moderate (average weight, post-op) CBG < 70: Implement Hypoglycemia Standing Orders and refer to Hypoglycemia Standing Orders sidebar report CBG 70 - 120: 0 units CBG 121 - 150: 2 units CBG 151 - 200: 3 units CBG 201 - 250: 5 units CBG 251 - 300: 8 units CBG 301 - 350: 11 units CBG 351 - 400: 15 units What changed:  how much to take when to take this additional instructions   ipratropium-albuterol  0.5-2.5 (3) MG/3ML Soln Commonly known as: DUONEB Take 3 mLs by nebulization every 6 (six) hours as needed (for wheezing or shortness of breath).   Lantus  SoloStar 100 UNIT/ML Solostar Pen Generic drug: insulin  glargine Inject 5 Units into the skin at bedtime.   liver oil-zinc  oxide 40 % ointment Commonly known as: DESITIN Apply topically 2 (two) times daily.   Magnesium  Oxide -Mg Supplement 250 MG Tabs Take 250 mg by mouth daily.   melatonin 3 MG Tabs tablet Take 3 mg by mouth at bedtime.   methocarbamol  500 MG tablet Commonly known as: ROBAXIN  Take 500 mg by mouth every 8 (eight) hours as needed for muscle spasms.   mirtazapine  7.5 MG tablet Commonly known as: REMERON  Take 7.5 mg by mouth at bedtime.   oxybutynin  10 MG 24 hr tablet Commonly known as: DITROPAN -XL Take 10 mg by mouth daily.   oxyCODONE  5 MG immediate release tablet Commonly known as: Oxy IR/ROXICODONE  Take 1 tablet (5 mg total) by mouth every 6 (six) hours as needed for severe pain (pain score 7-10). What changed: reasons to take this   OXYGEN  Inhale 2 L/min into the lungs continuous.    pantoprazole  40 MG tablet Commonly known as: PROTONIX  Take 1 tablet (40 mg total) by mouth 2 (two) times daily. What changed: when to take this   polyethylene glycol 17 g packet Commonly known as: MIRALAX  / GLYCOLAX  Take 17 g by mouth daily as needed for moderate constipation. What changed: reasons to take this   Refresh Tears 0.5 % Soln Generic drug: carboxymethylcellulose Place 1 drop into both eyes every 4 (four) hours as needed (for dryness).   saccharomyces boulardii 250 MG capsule Commonly known as: FLORASTOR Take 250 mg by mouth daily.   senna 8.6 MG tablet Commonly known as: SENOKOT Take 2 tablets by mouth at bedtime.               Discharge Care Instructions  (  From admission, onward)           Start     Ordered   01/10/24 0000  Discharge wound care:       Comments: Cleanse sacrum/buttocks with soap and water, dry and apply a thin layer of Desitin 2 times daily and prn soiling.  Cover with silicone foam or ABD pad and tape whichever is preferred/works best   01/10/24 1248            Discharge Exam:   Subjective: Sleeping on CPAP today.  Attempted to call her brother; left voice mail again today (he is a retired development worker, community in [facility]).   Objective: Vitals:   01/09/24 2027 01/10/24 0411  BP: (!) 143/74 94/78  Pulse: 79 94  Resp:    Temp: 98.1 F (36.7 C) 98.2 F (36.8 C)  SpO2: 99% 100%    Intake/Output Summary (Last 24 hours) at 01/10/2024 1248 Last data filed at 01/10/2024 0908 Gross per 24 hour  Intake 600 ml  Output 1150 ml  Net -550 ml   Filed Weights   01/08/24 0500 01/09/24 0500 01/10/24 0500  Weight: 82.2 kg 81.5 kg 81.1 kg    Exam:  General:  Appears calm and comfortable and is in NAD Eyes:  normal lids, iris ENT:  grossly normal hearing, lips & tongue, mmm Cardiovascular:  RRR, no m/r/g. No LE edema.  Respiratory:   CTA bilaterally with no wheezes/rales/rhonchi.  Normal respiratory effort. Abdomen:  soft, NT, ND Skin:  no  rash or induration seen on limited exam Musculoskeletal:  generalized weakness, no bony abnormality Psychiatric:  somnolent mood and affect, speech absent today Neurologic:  unable to effectively perform  Data Reviewed: I have reviewed the patient's lab results since admission.  Pertinent labs for today include:   None today    Condition at discharge: improving  The results of significant diagnostics from this hospitalization (including imaging, microbiology, ancillary and laboratory) are listed below for reference.   Imaging Studies: CT Angio Chest Pulmonary Embolism (PE) W or WO Contrast Result Date: 01/08/2024 CLINICAL DATA:  Concern for pulmonary embolism. EXAM: CT ANGIOGRAPHY CHEST WITH CONTRAST TECHNIQUE: Multidetector CT imaging of the chest was performed using the standard protocol during bolus administration of intravenous contrast. Multiplanar CT image reconstructions and MIPs were obtained to evaluate the vascular anatomy. RADIATION DOSE REDUCTION: This exam was performed according to the departmental dose-optimization program which includes automated exposure control, adjustment of the mA and/or kV according to patient size and/or use of iterative reconstruction technique. CONTRAST:  75mL OMNIPAQUE  IOHEXOL  350 MG/ML SOLN COMPARISON:  CT dated 11/03/2023. FINDINGS: Cardiovascular: There is cardiomegaly with biatrial dilatation. There is coronary vascular calcification and postsurgical changes of CABG. Mild atherosclerotic calcification of the thoracic aorta. No intimal dilatation. The origins of the great vessels of the aortic arch appear patent as visualized. No pulmonary artery embolus identified. Mediastinum/Nodes: No hilar or mediastinal adenopathy. The esophagus is grossly unremarkable. No mediastinal fluid collection. Lungs/Pleura: Small bilateral pleural effusions. There is partial compressive atelectasis of the lower lobes versus pneumonia. Clinical correlation is recommended. No  pneumothorax. The central airways are patent. Upper Abdomen: Cirrhosis. Musculoskeletal: Osteopenia with degenerative changes of the spine. Median sternotomy wires. No acute osseous pathology. Review of the MIP images confirms the above findings. IMPRESSION: 1. No CT evidence of pulmonary artery embolus. 2. Small bilateral pleural effusions with partial compressive atelectasis of the lower lobes versus pneumonia. 3. Cardiomegaly with biatrial dilatation. 4. Cirrhosis. 5.  Aortic Atherosclerosis (ICD10-I70.0). Electronically  Signed   By: Arash  Radparvar M.D.   On: 01/08/2024 14:38   ECHOCARDIOGRAM LIMITED Result Date: 01/08/2024    ECHOCARDIOGRAM LIMITED REPORT   Patient Name:   Eyana L Boyland Date of Exam: 01/08/2024 Medical Rec #:  [id number]     Height:       62.0 in Accession #:    [id number]    Weight:       181.2 lb Date of Birth:  Oct 05, 1940      BSA:          1.833 m Patient Age:    83 years      BP:           103/71 mmHg Patient Gender: F             HR:           91 bpm. Exam Location:  Inpatient Procedure: Limited Echo, Color Doppler and Cardiac Doppler (Both Spectral and            Color Flow Doppler were utilized during procedure). Indications:    CHF I50.31  History:        Patient has prior history of Echocardiogram examinations, most                 recent 11/04/2023. CHF, Pericardial Disease; Risk                 Factors:Hypertension.  Sonographer:    Tinnie Gosling RDCS Referring Phys: [id number] Vivaan Helseth IMPRESSIONS  1. Left ventricular ejection fraction, by estimation, is 50 to 55%. The left ventricle has low normal function. The left ventricle has no regional wall motion abnormalities. Diastology indterminate due to atrial fibrillation. There is the interventricular septum is flattened in diastole ('D' shaped left ventricle), consistent with right ventricular volume overload.  2. The IVC was poorly visualized for definitive RVSP calculation. Right ventricular systolic function is moderately reduced.  The right ventricular size is moderately enlarged.  3. Right atrial size was mildly dilated. Comparison(s): Changes from prior study are noted. Slight reduction of LVEF to 50-55%. Moderate enlargement of RV with moderate RV systolic dysfunction. Conclusion(s)/Recommendation(s): Consider evaluating for pulmonary embolus given right ventricular enlargement and hypokinesis. FINDINGS  Left Ventricle: Left ventricular ejection fraction, by estimation, is 50 to 55%. The left ventricle has low normal function. The left ventricle has no regional wall motion abnormalities. The left ventricular internal cavity size was normal in size. There is no left ventricular hypertrophy. The interventricular septum is flattened in diastole ('D' shaped left ventricle), consistent with right ventricular volume overload. Diastology indterminate due to atrial fibrillation. Right Ventricle: The IVC was poorly visualized for definitive RVSP calculation. The right ventricular size is moderately enlarged. No increase in right ventricular wall thickness. Right ventricular systolic function is moderately reduced. Left Atrium: Left atrial size was normal in size. Right Atrium: Right atrial size was mildly dilated. Pericardium: There is no evidence of pericardial effusion. Tricuspid Valve: The tricuspid valve is normal in structure. Tricuspid valve regurgitation is mild . No evidence of tricuspid stenosis. Venous: The inferior vena cava was not well visualized. LEFT VENTRICLE PLAX 2D LVIDd:         4.40 cm LVIDs:         2.80 cm LV PW:         1.00 cm LV IVS:        0.90 cm  LV Volumes (MOD) LV vol d, MOD A4C: 72.7 ml LV vol s, MOD A4C:  35.4 ml LV SV MOD A4C:     72.7 ml RIGHT ATRIUM           Index RA Area:     22.70 cm RA Volume:   73.60 ml  40.15 ml/m  TRICUSPID VALVE TR Peak grad:   32.5 mmHg TR Vmax:        285.00 cm/s Georganna Archer Electronically signed by Georganna Archer Signature Date/Time: 01/08/2024/8:54:54 AM    Final    DG Chest  Portable 1 View Result Date: 01/06/2024 EXAM: 1 VIEW XRAY OF THE CHEST 01/06/2024 03:50:00 AM COMPARISON: 11/03/2023 CLINICAL HISTORY: SOB FINDINGS: LUNGS AND PLEURA: Small to moderate bilateral pleural effusions. Perihilar airspace opacities. Mild pulmonary edema. No pneumothorax. HEART AND MEDIASTINUM: Heart size at upper limits. Aortic atherosclerosis. CABG markers noted. BONES AND SOFT TISSUES: Sternotomy wires noted. IMPRESSION: 1. Mild pulmonary edema and small to moderate bilateral pleural effusions. Electronically signed by: Waddell Calk MD 01/06/2024 05:06 AM EST RP Workstation: [id number]    Microbiology: Results for orders placed or performed during the hospital encounter of 01/06/24  Resp panel by RT-PCR (RSV, Flu A&B, Covid) Anterior Nasal Swab     Status: None   Collection Time: 01/06/24  3:59 AM   Specimen: Anterior Nasal Swab  Result Value Ref Range Status   SARS Coronavirus 2 by RT PCR NEGATIVE NEGATIVE Final    Comment: (NOTE) SARS-CoV-2 target nucleic acids are NOT DETECTED.  The SARS-CoV-2 RNA is generally detectable in upper respiratory specimens during the acute phase of infection. The lowest concentration of SARS-CoV-2 viral copies this assay can detect is 138 copies/mL. A negative result does not preclude SARS-Cov-2 infection and should not be used as the sole basis for treatment or other patient management decisions. A negative result may occur with  improper specimen collection/handling, submission of specimen other than nasopharyngeal swab, presence of viral mutation(s) within the areas targeted by this assay, and inadequate number of viral copies(<138 copies/mL). A negative result must be combined with clinical observations, patient history, and epidemiological information. The expected result is Negative.  Fact Sheet for Patients:  bloggercourse.com  Fact Sheet for Healthcare Providers:   seriousbroker.it  This test is no t yet approved or cleared by the [place]  FDA and  has been authorized for detection and/or diagnosis of SARS-CoV-2 by FDA under an Emergency Use Authorization (EUA). This EUA will remain  in effect (meaning this test can be used) for the duration of the COVID-19 declaration under Section 564(b)(1) of the Act, 21 U.S.C.section 360bbb-3(b)(1), unless the authorization is terminated  or revoked sooner.       Influenza A by PCR NEGATIVE NEGATIVE Final   Influenza B by PCR NEGATIVE NEGATIVE Final    Comment: (NOTE) The Xpert Xpress SARS-CoV-2/FLU/RSV plus assay is intended as an aid in the diagnosis of influenza from Nasopharyngeal swab specimens and should not be used as a sole basis for treatment. Nasal washings and aspirates are unacceptable for Xpert Xpress SARS-CoV-2/FLU/RSV testing.  Fact Sheet for Patients: bloggercourse.com  Fact Sheet for Healthcare Providers: seriousbroker.it  This test is not yet approved or cleared by the [place]  FDA and has been authorized for detection and/or diagnosis of SARS-CoV-2 by FDA under an Emergency Use Authorization (EUA). This EUA will remain in effect (meaning this test can be used) for the duration of the COVID-19 declaration under Section 564(b)(1) of the Act, 21 U.S.C. section 360bbb-3(b)(1), unless the authorization is terminated or revoked.     Resp Syncytial Virus by  PCR NEGATIVE NEGATIVE Final    Comment: (NOTE) Fact Sheet for Patients: bloggercourse.com  Fact Sheet for Healthcare Providers: seriousbroker.it  This test is not yet approved or cleared by the [place]  FDA and has been authorized for detection and/or diagnosis of SARS-CoV-2 by FDA under an Emergency Use Authorization (EUA). This EUA will remain in effect (meaning this test can be used) for  the duration of the COVID-19 declaration under Section 564(b)(1) of the Act, 21 U.S.C. section 360bbb-3(b)(1), unless the authorization is terminated or revoked.  Performed at [facility], [address] [place], [place] [address]   MRSA Next Gen by PCR, Nasal     Status: None   Collection Time: 01/06/24  2:45 PM   Specimen: Nasal Mucosa; Nasal Swab  Result Value Ref Range Status   MRSA by PCR Next Gen NOT DETECTED NOT DETECTED Final    Comment: (NOTE) The GeneXpert MRSA Assay (FDA approved for NASAL specimens only), is one component of a comprehensive MRSA colonization surveillance program. It is not intended to diagnose MRSA infection nor to guide or monitor treatment for MRSA infections. Test performance is not FDA approved in patients less than 69 years old. Performed at [facility], [address] [place], [place] [address]     Labs: CBC: Recent Labs  Lab 01/06/24 0327 01/07/24 0239 01/08/24 0754  WBC 10.7* 4.6 6.5  NEUTROABS 8.5*  --   --   HGB 9.2* 8.6* 9.4*  HCT 30.0* 27.9* 29.8*  MCV 106.4* 104.5* 103.1*  PLT 192 146* 184   Basic Metabolic Panel: Recent Labs  Lab 01/06/24 0327 01/07/24 0239 01/08/24 0754  NA 137 137 135  K 4.9 4.3 4.6  CL 94* 95* 92*  CO2 35* 37* 35*  GLUCOSE 294* 283* 197*  BUN 13 15 13   CREATININE 1.09* 1.03* 0.79  CALCIUM  10.0 9.8 10.1   Liver Function Tests: Recent Labs  Lab 01/06/24 0327  AST 30  ALT 10  ALKPHOS 125  BILITOT 1.0  PROT 7.6  ALBUMIN  3.8   CBG: Recent Labs  Lab 01/09/24 2024 01/10/24 0013 01/10/24 0406 01/10/24 0733 01/10/24 1120  GLUCAP 278* 283* 218* 211* 342*    Discharge time spent: greater than 30 minutes.  Signed: Delon Herald, MD [facility] 01/10/2024 "

## 2024-01-10 NOTE — Progress Notes (Signed)
 Spoke with RN concerning BiPAP order stating Continuous, with additional comments for naps and bedtime. It is the understanding from RN that PT desaturates when sleeping. PT is currently up in bedside chair, eating, under no respiratory distress at this time. RT has asked RN to reach out to MD for clearer order.

## 2024-01-10 NOTE — Assessment & Plan Note (Addendum)
 Hypoxia with elevated BNP, mildly elevated troponin on presentation - appears to be c/w CHF exacerbation Treated with furosemide  20 mg IVP twice daily Repeat echocardiogram limited showed EF 50-55% with RV volume overload

## 2024-01-11 DIAGNOSIS — J9621 Acute and chronic respiratory failure with hypoxia: Secondary | ICD-10-CM | POA: Diagnosis not present

## 2024-01-11 LAB — BASIC METABOLIC PANEL WITH GFR
Anion gap: 9 (ref 5–15)
BUN: 20 mg/dL (ref 8–23)
CO2: 38 mmol/L — ABNORMAL HIGH (ref 22–32)
Calcium: 9.7 mg/dL (ref 8.9–10.3)
Chloride: 89 mmol/L — ABNORMAL LOW (ref 98–111)
Creatinine, Ser: 0.91 mg/dL (ref 0.44–1.00)
GFR, Estimated: >60 mL/min
Glucose, Bld: 293 mg/dL — ABNORMAL HIGH (ref 70–99)
Potassium: 3 mmol/L — ABNORMAL LOW (ref 3.5–5.1)
Sodium: 136 mmol/L (ref 135–145)

## 2024-01-11 LAB — GLUCOSE, CAPILLARY
Glucose-Capillary: 271 mg/dL — ABNORMAL HIGH (ref 70–99)
Glucose-Capillary: 272 mg/dL — ABNORMAL HIGH (ref 70–99)
Glucose-Capillary: 157 mg/dL — ABNORMAL HIGH (ref 70–99)
Glucose-Capillary: 239 mg/dL — ABNORMAL HIGH (ref 70–99)

## 2024-01-11 LAB — CBC
HCT: 29 % — ABNORMAL LOW (ref 36.0–46.0)
Hemoglobin: 9.3 g/dL — ABNORMAL LOW (ref 12.0–15.0)
MCH: 32.1 pg (ref 26.0–34.0)
MCHC: 32.1 g/dL (ref 30.0–36.0)
MCV: 100 fL (ref 80.0–100.0)
Platelets: 219 K/uL (ref 150–400)
RBC: 2.9 MIL/uL — ABNORMAL LOW (ref 3.87–5.11)
RDW: 13.3 % (ref 11.5–15.5)
WBC: 5.3 K/uL (ref 4.0–10.5)
nRBC: 0 % (ref 0.0–0.2)

## 2024-01-11 MED ORDER — ALBUTEROL SULFATE (2.5 MG/3ML) 0.083% IN NEBU: 2.5000 mg | INHALATION_SOLUTION | Freq: Four times a day (QID) | RESPIRATORY_TRACT | Status: DC | PRN

## 2024-01-11 MED ORDER — POTASSIUM CHLORIDE CRYS ER 20 MEQ PO TBCR
40.0000 meq | EXTENDED_RELEASE_TABLET | ORAL | Status: AC
  Administered 2024-01-11 (×2): 40 meq via ORAL
  Filled 2024-01-11 (×2): qty 2

## 2024-01-11 MED ORDER — INSULIN GLARGINE-YFGN 100 UNIT/ML ~~LOC~~ SOLN
10.0000 [IU] | Freq: Every day | SUBCUTANEOUS | Status: DC
  Administered 2024-01-11: 10 [IU] via SUBCUTANEOUS
  Filled 2024-01-11: qty 0.1

## 2024-01-11 MED ORDER — FUROSEMIDE 20 MG PO TABS
20.0000 mg | ORAL_TABLET | Freq: Every day | ORAL | Status: DC
  Administered 2024-01-12: 20 mg via ORAL
  Filled 2024-01-11: qty 1

## 2024-01-11 NOTE — Assessment & Plan Note (Addendum)
"  Continue PPI  "

## 2024-01-11 NOTE — Assessment & Plan Note (Addendum)
 Wound 01/07/24 2018 Pressure Injury Sacrum Left;Right Deep Tissue Pressure Injury - Purple or maroon localized area of discolored intact skin or blood-filled blister due to damage of underlying soft tissue from pressure and/or shear. (Active)

## 2024-01-11 NOTE — Assessment & Plan Note (Addendum)
 Hypoxia with elevated BNP, mildly elevated troponin on presentation - appears to be c/w CHF exacerbation Treated with furosemide  20 mg IVP twice daily Repeat echocardiogram limited showed EF 50-55% with RV volume overload Edema has resolved, will transition to PO Lasix  at this time

## 2024-01-11 NOTE — Assessment & Plan Note (Addendum)
 Continue apixaban  twice daily Continue carvedilol  twice daily for rate control - HR 97-109, currently 101

## 2024-01-11 NOTE — Assessment & Plan Note (Addendum)
 Hold amlodipine  Continue carvedilol  6.25 mg p.o. twice daily

## 2024-01-11 NOTE — Assessment & Plan Note (Addendum)
 Continue mirtazapine , buspirone , and melatonin

## 2024-01-11 NOTE — Assessment & Plan Note (Addendum)
 Patient is on 2L home O2 Presented with O2 sats in the 50s at SNF Elevated BNP, mildly elevated troponin - likely some component of CHF exacerbation Last echo was in 10/2023 and showed preserved EF (actually hyperdynamic) and grade 2 diastolic dysfunction Repeat echocardiogram limited showed EF 50-55% with RV volume overload which may be related to CHF or could be from underlying lung disease CTA negative for PE She has continued to have episodes of O2 sats in the 60s with apnea ABG shows pCO2 62, possibly related to COPD and/or sleep disordered breathing Will dc back to LTC with BIPAP (vs. Non-invasive ventilator depending on insurance) Discussed with Dr. Shelah, who agrees Facility cannot obtain BIPAP until 1/5 so will hold dc and plan to dc in AM

## 2024-01-11 NOTE — Assessment & Plan Note (Addendum)
 She has continued to have periodic precipitous drops in her O2 levels with sleep and appears to have apnea ABG with chronic hypercapnic respiratory failure, qualifying her for home BIPAP She will need an outpatient sleep study

## 2024-01-11 NOTE — Plan of Care (Signed)
" °  Problem: Coping: Goal: Ability to adjust to condition or change in health will improve Outcome: Progressing   Problem: Fluid Volume: Goal: Ability to maintain a balanced intake and output will improve Outcome: Progressing   Problem: Metabolic: Goal: Ability to maintain appropriate glucose levels will improve Outcome: Progressing   Problem: Skin Integrity: Goal: Risk for impaired skin integrity will decrease Outcome: Progressing   Problem: Clinical Measurements: Goal: Ability to maintain clinical measurements within normal limits will improve Outcome: Progressing Goal: Cardiovascular complication will be avoided Outcome: Progressing   "

## 2024-01-11 NOTE — Assessment & Plan Note (Addendum)
 Last A1c was 5.7, good control Persistent hyperglycemia while here; will increase glargine to 10 units qhs Cover with moderate-scale SSI Carb modified diet

## 2024-01-11 NOTE — Assessment & Plan Note (Addendum)
 Continue gabapentin  and oxycodone  I have reviewed this patient in the [place] Controlled Substances Reporting System.  She is receiving medications from only one provider and appears to be taking them as prescribed. She is not at particularly high risk of opioid misuse, diversion, or overdose.

## 2024-01-11 NOTE — Plan of Care (Signed)
" °  Problem: Fluid Volume: Goal: Ability to maintain a balanced intake and output will improve Outcome: Progressing   Problem: Health Behavior/Discharge Planning: Goal: Ability to manage health-related needs will improve Outcome: Progressing   Problem: Nutritional: Goal: Maintenance of adequate nutrition will improve Outcome: Progressing   Problem: Nutrition: Goal: Adequate nutrition will be maintained Outcome: Progressing   Problem: Elimination: Goal: Will not experience complications related to urinary retention Outcome: Progressing   Problem: Pain Managment: Goal: General experience of comfort will improve and/or be controlled Outcome: Progressing   "

## 2024-01-11 NOTE — Assessment & Plan Note (Addendum)
 Continue  carvedill

## 2024-01-11 NOTE — Progress Notes (Signed)
 " Progress Note   Patient: Andrea Kirk FMW:[id number] DOB: October 02, 1940 DOA: 01/06/2024     5 DOS: the patient was seen and examined on 01/11/2024   Brief hospital course: 84yo with h/o CVA, depression/anxiety, CAD s/p CABG, HTN, T2DM, and HLD who presented on 12/30 with SOB. O2 sats were in the 50s on presentation. Troponin 25, 27. Pro-BNP 1832. CXR with mild pulmonary edema and small to moderate B effusions. Given IV Lasix  and started on supplemental O2 for CHF exacerbation.    Assessment & Plan Acute on chronic respiratory failure with hypoxia and hypercapnia (HCC) Patient is on 2L home O2 Presented with O2 sats in the 50s at SNF Elevated BNP, mildly elevated troponin - likely some component of CHF exacerbation Last echo was in 10/2023 and showed preserved EF (actually hyperdynamic) and grade 2 diastolic dysfunction Repeat echocardiogram limited showed EF 50-55% with RV volume overload which may be related to CHF or could be from underlying lung disease CTA negative for PE She has continued to have episodes of O2 sats in the 60s with apnea ABG shows pCO2 62, possibly related to COPD and/or sleep disordered breathing Will dc back to LTC with BIPAP (vs. Non-invasive ventilator depending on insurance) Discussed with Dr. Shelah, who agrees Facility cannot obtain BIPAP until 1/5 so will hold dc and plan to dc in AM Acute on chronic diastolic CHF (congestive heart failure) (HCC) Hypoxia with elevated BNP, mildly elevated troponin on presentation - appears to be c/w CHF exacerbation Treated with furosemide  20 mg IVP twice daily Repeat echocardiogram limited showed EF 50-55% with RV volume overload Edema has resolved, will transition to PO Lasix  at this time Sleep apnea She has continued to have periodic precipitous drops in her O2 levels with sleep and appears to have apnea ABG with chronic hypercapnic respiratory failure, qualifying her for home BIPAP She will need an outpatient sleep  study Coronary artery disease Continue  carvedill Controlled type 2 diabetes mellitus with hyperglycemia (HCC) Last A1c was 5.7, good control Persistent hyperglycemia while here; will increase glargine to 10 units qhs Cover with moderate-scale SSI Carb modified diet  Essential hypertension Hold amlodipine  Continue carvedilol  6.25 mg p.o. twice daily HLD (hyperlipidemia) Would benefit from statin therapy Anxiety and depression Continue mirtazapine , buspirone , and melatonin  Gastro-esophageal reflux disease without esophagitis Continue PPI Peripheral neuropathy Continue gabapentin  and oxycodone  I have reviewed this patient in the [place] Controlled Substances Reporting System.  She is receiving medications from only one provider and appears to be taking them as prescribed. She is not at particularly high risk of opioid misuse, diversion, or overdose.  Persistent atrial fibrillation (HCC) Continue apixaban  twice daily Continue carvedilol  twice daily for rate control - HR 97-109, currently 101  History of hip fracture S/p IM nail fixation of R intertrochanteric fracture on 10/31 Has completed STR at [facility] and is back in LTC at this time Pressure injury Wound 01/07/24 2018 Pressure Injury Sacrum Left;Right Deep Tissue Pressure Injury - Purple or maroon localized area of discolored intact skin or blood-filled blister due to damage of underlying soft tissue from pressure and/or shear. (Active)         Consultants: None   Procedures: Echocardiogram 1/1   Antibiotics: None    30 Day Unplanned Readmission Risk Score    Flowsheet Row ED to Hosp-Admission (Current) from 01/06/2024 in [facility]TH FLOOR [facility]GR[facility]IVE C[facility] AND UROLOGY  30 Day Unplanned Readmission Risk Score (%) 24.92 Filed at 01/11/2024 0801    This  score is the patient's risk of an unplanned readmission within 30 days of being discharged (0 -100%). The score is based on dignosis, age, lab data, medications,  orders, and past utilization.   Low:  0-14.9   Medium: 15-21.9   High: 22-29.9   Extreme: 30 and above           Subjective: Appears to understand diagnosis and plan.  She is not delighted with her facility but understands that she will need to return there and can work with SW there to try to move to another facility, if possible.  BIPAP is irritating the top of her nose.  Discussed with her brother, who voices agreement with the plan.   Objective: Vitals:   01/11/24 0544 01/11/24 0832  BP: 113/76   Pulse: 70   Resp: 14 10  Temp: (!) 97.5 F (36.4 C)   SpO2: 100%     Intake/Output Summary (Last 24 hours) at 01/11/2024 1245 Last data filed at 01/10/2024 2017 Gross per 24 hour  Intake 120 ml  Output 1400 ml  Net -1280 ml   Filed Weights   01/09/24 0500 01/10/24 0500 01/11/24 0728  Weight: 81.5 kg 81.1 kg 81.1 kg    Exam:  General:  Appears calm and comfortable and is in NAD Eyes:  normal lids, iris ENT:  grossly normal hearing, lips & tongue, mmm; mild erythema along nasal bridge Cardiovascular:  RRR. No LE edema.  Respiratory:   CTA bilaterally with no wheezes/rales/rhonchi.  Normal respiratory effort. Abdomen:  soft, NT, ND Skin:  no rash or induration seen on limited exam Musculoskeletal:  grossly normal tone BUE/BLE, good ROM, no bony abnormality Psychiatric:  grossly normal mood and affect, speech fluent and appropriate, AOx3 Neurologic:  CN 2-12 grossly intact, moves all extremities in coordinated fashion  Data Reviewed: I have reviewed the patient's lab results since admission.  Pertinent labs for today include:   K+ 3.0 CO2 38 Glucose 293 WBC 5.3 Hgb 9.3     Family Communication: None present; I spoke with her brother (Physician in [facility]) by telephone while in the room  Mobility: PT/OT Consulted and are recommending - Other (Comment) (Return To Her Ltc, She Was Working With Therapy Some)01/09/2024 1737    Code Status: Full Code   Disposition: Status  is: Inpatient Remains inpatient appropriate because: awaiting return to placement on 1/5     Time spent: 50 minutes  Unresulted Labs (From admission, onward)     Start     Ordered   01/12/24 0500  Basic metabolic panel with GFR  Tomorrow morning,   R        01/11/24 1243   01/12/24 0500  CBC  Tomorrow morning,   R        01/11/24 1243             Author: Delon Herald, MD 01/11/2024 12:45 PM  For on call review [email or web address].            "

## 2024-01-11 NOTE — Assessment & Plan Note (Addendum)
 Would benefit from statin therapy

## 2024-01-11 NOTE — Assessment & Plan Note (Addendum)
 S/p IM nail fixation of R intertrochanteric fracture on 10/31 Has completed STR at [facility] and is back in LTC at this time

## 2024-01-11 NOTE — Progress Notes (Signed)
 Mobility Specialist - Progress Note   01/11/24 1009  Mobility  Activity Pivoted/transferred from chair to bed  Level of Assistance +2 (takes two people)  Press Photographer wheel walker  Range of Motion/Exercises Active Assistive  Activity Response Tolerated fair  Mobility visit 1 Mobility  Mobility Specialist Start Time (ACUTE ONLY) [id number]  Mobility Specialist Stop Time (ACUTE ONLY) 1009  Mobility Specialist Time Calculation (min) (ACUTE ONLY) 19 min   NT requested assistance to transfer pt to bed. Encouraged pursed lip breathing throughout session. Pt able to stand for pericare but did grow fatigued. At EOS was left in bed with all needs met. Call bell in reach and NT in room.   Erminio Leos,  Mobility Specialist Can be reached via Secure Chat

## 2024-01-12 LAB — BASIC METABOLIC PANEL WITH GFR
Anion gap: 8 (ref 5–15)
BUN: 23 mg/dL (ref 8–23)
CO2: 37 mmol/L — ABNORMAL HIGH (ref 22–32)
Calcium: 9.4 mg/dL (ref 8.9–10.3)
Chloride: 93 mmol/L — ABNORMAL LOW (ref 98–111)
Creatinine, Ser: 0.93 mg/dL (ref 0.44–1.00)
GFR, Estimated: >60 mL/min
Glucose, Bld: 393 mg/dL — ABNORMAL HIGH (ref 70–99)
Potassium: 3.8 mmol/L (ref 3.5–5.1)
Sodium: 137 mmol/L (ref 135–145)

## 2024-01-12 LAB — CBC
HCT: 27.1 % — ABNORMAL LOW (ref 36.0–46.0)
Hemoglobin: 8.4 g/dL — ABNORMAL LOW (ref 12.0–15.0)
MCH: 31.5 pg (ref 26.0–34.0)
MCHC: 31 g/dL (ref 30.0–36.0)
MCV: 101.5 fL — ABNORMAL HIGH (ref 80.0–100.0)
Platelets: 198 K/uL (ref 150–400)
RBC: 2.67 MIL/uL — ABNORMAL LOW (ref 3.87–5.11)
RDW: 13.4 % (ref 11.5–15.5)
WBC: 4.7 K/uL (ref 4.0–10.5)
nRBC: 0 % (ref 0.0–0.2)

## 2024-01-12 LAB — GLUCOSE, CAPILLARY
Glucose-Capillary: 361 mg/dL — ABNORMAL HIGH (ref 70–99)
Glucose-Capillary: 306 mg/dL — ABNORMAL HIGH (ref 70–99)

## 2024-01-12 NOTE — Assessment & Plan Note (Addendum)
 Wound 01/07/24 2018 Pressure Injury Sacrum Left;Right Deep Tissue Pressure Injury - Purple or maroon localized area of discolored intact skin or blood-filled blister due to damage of underlying soft tissue from pressure and/or shear. (Active)

## 2024-01-12 NOTE — TOC Progression Note (Signed)
 Transition of Care [facility]) - Progression Note    Patient Details  Name: Campbell Agramonte Newburn MRN: [id number] Date of Birth: June 23, 1940  [facility] CM/SW Contact  Kobe Jansma, Nathanel, RN Phone Number: 01/12/2024, 9:18 AM  Clinical Narrative:  LVM w/[facility] rep Shona fro d/c back to [facility] LTC w/Bipap-await call back.     Expected Discharge Plan: Long Term Nursing Home Barriers to Discharge: No Barriers Identified               Expected Discharge Plan and Services   Discharge Planning Services: CM Consult Post Acute Care Choice: Nursing Home Living arrangements for the past 2 months:  ([facility] home) Expected Discharge Date: 01/12/24                                     Social Drivers of Health (SDOH) Interventions SDOH Screenings   Food Insecurity: No Food Insecurity (01/07/2024)  Housing: Low Risk (01/07/2024)  Transportation Needs: No Transportation Needs (01/07/2024)  Utilities: Not At Risk (01/07/2024)  Social Connections: Unknown (01/07/2024)  Tobacco Use: Low Risk (01/06/2024)    Readmission Risk Interventions     No data to display

## 2024-01-12 NOTE — Assessment & Plan Note (Addendum)
 She has continued to have periodic precipitous drops in her O2 levels with sleep and appears to have apnea ABG with chronic hypercapnic respiratory failure, qualifying her for home BIPAP She will need an outpatient sleep study

## 2024-01-12 NOTE — Assessment & Plan Note (Addendum)
 Continue apixaban  twice daily Continue carvedilol  twice daily for rate control - HR 97-109, currently 101

## 2024-01-12 NOTE — Assessment & Plan Note (Addendum)
 Continue  carvedill

## 2024-01-12 NOTE — NC FL2 (Signed)
 " [place]  MEDICAID FL2 LEVEL OF CARE FORM     IDENTIFICATION  Patient Name: Andrea Kirk Birthdate: 01-19-1940 Sex: female Admission Date (Current Location): 01/06/2024  [place] and [place] Number:  [place] [id number] Facility and Address:  [facility],  [address], [place] [address]      Provider Number: [id number]  Attending Physician Name and Address:  Barbarann Nest, MD  Relative Name and Phone Number:  [phone]([phone]    Current Level of Care: Hospital Recommended Level of Care: Other (Comment) (LTC) Prior Approval Number:    Date Approved/Denied:   PASRR Number: [id number]  Discharge Plan: Other (Comment) (LTC)    Current Diagnoses: Patient Active Problem List   Diagnosis Date Noted   Sleep apnea 01/10/2024   History of hip fracture 01/08/2024   Pressure injury 01/08/2024   Acute on chronic respiratory failure with hypoxia and hypercapnia (HCC) 01/06/2024   Persistent atrial fibrillation (HCC) 01/06/2024   Abnormal gait 12/16/2023   Acquired hallux valgus 12/16/2023   Allergic rhinitis 12/16/2023   Anticoagulant long-term use 12/16/2023   Background diabetic retinopathy (HCC) 12/16/2023   Benign neoplasm of cranial nerve (HCC) 12/16/2023   Chronic maxillary sinusitis 12/16/2023   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 12/16/2023   Dyslipidemia 12/16/2023   Gastro-esophageal reflux disease without esophagitis 12/16/2023   History of colonic polyps 12/16/2023   Hypercalcemia 12/16/2023   Irritable bowel syndrome 12/16/2023   Left lower lobe pulmonary nodule 12/16/2023   Major depression, single episode 12/16/2023   Muscle pain 12/16/2023   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   Peripheral neuropathy 12/16/2023   Primary osteoarthritis involving multiple joints 12/16/2023   Pure hypercholesterolemia 12/16/2023   Recurrent major depression in remission 12/16/2023   Restless legs 12/16/2023   Sciatica  12/16/2023   Status post aorto-coronary artery bypass graft 12/16/2023   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/2023   Vitamin D  deficiency 12/16/2023   Abnormal transaminases 11/03/2023   High anion gap metabolic acidosis 11/03/2023   Uremia 11/03/2023   NSTEMI (non-ST elevated myocardial infarction) (HCC) 11/03/2023   Acute on chronic diastolic CHF (congestive heart failure) (HCC) 11/03/2023   Acute CVA (cerebrovascular accident) (HCC) 11/03/2023   Preop examination 10/22/2023   Fall 10/22/2023   Displaced intertrochanteric fracture of right femur, initial encounter for closed fracture (HCC) 10/21/2023   Chronic respiratory failure with hypoxia (HCC) 10/21/2023   Bilateral sensorineural hearing loss 07/25/2022   History of sepsis 2/2 UTI 05/28/2022   Nausea 02/17/2021   C. difficile colitis 02/15/2021   Elevated troponin 02/14/2021   Gram-negative bacteremia 02/14/2021   Thrombocytopenia 02/13/2021   Acute encephalopathy 02/13/2021   Anxiety and depression 02/13/2021   Closed fracture of acromial process of right scapula 11/21/2020   Syncope, vasovagal 06/23/2020   Pulmonary hypertension, unspecified (HCC) 04/20/2020   Controlled type 2 diabetes mellitus with hyperglycemia (HCC) 04/20/2020   Essential hypertension 04/20/2020   HLD (hyperlipidemia) 04/20/2020   CKD (chronic kidney disease) stage 2, GFR 60-89 ml/min 04/20/2020   Iron deficiency anemia 04/20/2020   Obese 04/19/2020   Chronic back pain 04/19/2020   hx of CVA (cerebral vascular accident) (HCC) 04/16/2020   Macular retinoschisis, 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   Vitreomacular traction syndrome, 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   Depression 12/22/2017   Chronic pain 12/22/2017   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   Chronic diastolic CHF (congestive heart failure) (HCC) 12/22/2017   Acute renal failure superimposed on stage 2 chronic kidney disease 11/07/2016   AKI (acute  kidney injury) 08/04/2016   Frequent falls 07/16/2016    Carotid artery disease 04/16/2013   Coronary artery disease 03/11/2013   Occlusion and stenosis of carotid artery without mention of cerebral infarction 07/19/2011    Orientation RESPIRATION BLADDER Height & Weight     Self, Time, Situation, Place  O2 Continent, Incontinent Weight: 77.5 kg Height:  5' 2 (157.5 cm)  BEHAVIORAL SYMPTOMS/MOOD NEUROLOGICAL BOWEL NUTRITION STATUS      Continent, Incontinent Diet (Heart Healthy)  AMBULATORY STATUS COMMUNICATION OF NEEDS Skin   Extensive Assist Verbally PU Stage and Appropriate Care (sacrum pressure injury-see d/c summary) PU Stage 1 Dressing: Daily                     Personal Care Assistance Level of Assistance  Bathing, Feeding, Dressing Bathing Assistance: Maximum assistance Feeding assistance: Maximum assistance Dressing Assistance: Maximum assistance     Functional Limitations Info  Sight, Hearing, Speech Sight Info: Impaired (eyeglasses) Hearing Info: Adequate Speech Info: Adequate    SPECIAL CARE FACTORS FREQUENCY                       Contractures Contractures Info: Not present    Additional Factors Info  Code Status, Allergies Code Status Info: Full Allergies Info: Latex, Codeine, Morphine  And Codeine, Cyclobenzaprine, Grapefruit Concentrate, Jardiance (Empagliflozin)           Current Medications (01/12/2024):  This is the current hospital active medication list Current Facility-Administered Medications  Medication Dose Route Frequency Provider Last Rate Last Admin   acetaminophen  (TYLENOL ) tablet 650 mg  650 mg Oral Q6H PRN Celinda Alm Lot, MD   650 mg at 01/11/24 1720   Or   acetaminophen  (TYLENOL ) suppository 650 mg  650 mg Rectal Q6H PRN Celinda Alm Lot, MD       albuterol  (PROVENTIL ) (2.5 MG/3ML) 0.083% nebulizer solution 2.5 mg  2.5 mg Nebulization Q6H PRN Barbarann Nest, MD       apixaban  (ELIQUIS ) tablet 5 mg  5 mg Oral BID Celinda Alm Lot, MD   5 mg at 01/12/24 0901   artificial  tears ophthalmic solution 1 drop  1 drop Both Eyes Q4H PRN Celinda Alm Lot, MD       busPIRone  (BUSPAR ) tablet 10 mg  10 mg Oral BID Celinda Alm Lot, MD   10 mg at 01/12/24 0901   carvedilol  (COREG ) tablet 6.25 mg  6.25 mg Oral BID WC Celinda Alm Lot, MD   6.25 mg at 01/12/24 0901   Chlorhexidine  Gluconate Cloth 2 % PADS 6 each  6 each Topical Daily Shona Terry SAILOR, DO   6 each at 01/07/24 1000   citalopram  (CELEXA ) tablet 20 mg  20 mg Oral Daily Celinda Alm Lot, MD   20 mg at 01/12/24 0901   cyanocobalamin  (VITAMIN B12) tablet 1,000 mcg  1,000 mcg Oral Daily Celinda Alm Lot, MD   1,000 mcg at 01/12/24 0901   docusate sodium  (COLACE) capsule 100 mg  100 mg Oral QHS Celinda Alm Lot, MD   100 mg at 01/11/24 2228   furosemide  (LASIX ) tablet 20 mg  20 mg Oral Daily Barbarann Nest, MD   20 mg at 01/12/24 0901   gabapentin  (NEURONTIN ) capsule 300 mg  300 mg Oral BID Celinda Alm Lot, MD   300 mg at 01/12/24 0901   insulin  aspart (novoLOG ) injection 0-15 Units  0-15 Units Subcutaneous TID WC Celinda Alm Lot, MD   15 Units at 01/12/24 [phone]  insulin  glargine-yfgn (SEMGLEE ) injection 10 Units  10 Units Subcutaneous QHS Barbarann Nest, MD   10 Units at 01/11/24 2200   liver oil-zinc  oxide (DESITIN) 40 % ointment   Topical BID Barbarann Nest, MD   Given at 01/12/24 0902   magnesium  oxide (MAG-OX) tablet 400 mg  400 mg Oral Daily Celinda Alm Lot, MD   400 mg at 01/12/24 0900   melatonin tablet 3 mg  3 mg Oral QHS Celinda Alm Lot, MD   3 mg at 01/11/24 2228   mirtazapine  (REMERON ) tablet 7.5 mg  7.5 mg Oral QHS Celinda Alm Lot, MD   7.5 mg at 01/11/24 2228   ondansetron  (ZOFRAN ) injection 4 mg  4 mg Intravenous Once Long, Joshua G, MD       ondansetron  (ZOFRAN ) tablet 4 mg  4 mg Oral Q6H PRN Celinda Alm Lot, MD       Or   ondansetron  (ZOFRAN ) injection 4 mg  4 mg Intravenous Q6H PRN Celinda Alm Lot, MD       oxybutynin  (DITROPAN -XL) 24 hr tablet 10 mg  10 mg  Oral Daily Celinda Alm Lot, MD   10 mg at 01/12/24 0901   oxyCODONE  (Oxy IR/ROXICODONE ) immediate release tablet 5 mg  5 mg Oral Q6H PRN Celinda Alm Lot, MD   5 mg at 01/11/24 2355   pantoprazole  (PROTONIX ) EC tablet 40 mg  40 mg Oral BID Celinda Alm Lot, MD   40 mg at 01/12/24 0901   polyethylene glycol (MIRALAX  / GLYCOLAX ) packet 17 g  17 g Oral Daily PRN Celinda Alm Lot, MD       saccharomyces boulardii [facility]) capsule 250 mg  250 mg Oral Daily Celinda Alm Lot, MD   250 mg at 01/12/24 0900   senna (SENOKOT) tablet 17.2 mg  2 tablet Oral QHS Celinda Alm Lot, MD   17.2 mg at 01/11/24 2228     Discharge Medications: Please see discharge summary for a list of discharge medications.  Relevant Imaging Results:  Relevant Lab Results:   Additional Information SS#[id number] [phone]  Leslee Haueter, Nathanel, RN     "

## 2024-01-12 NOTE — Assessment & Plan Note (Addendum)
 Hypoxia with elevated BNP, mildly elevated troponin on presentation - appears to be c/w CHF exacerbation Treated with furosemide  20 mg IVP twice daily Repeat echocardiogram limited showed EF 50-55% with RV volume overload Edema has resolved,  transitioned to PO Lasix  but now appears effectively diuresed and renal function is uptrending; will resume prn Lasix  at this time for weight increase > 3 pounds

## 2024-01-12 NOTE — Assessment & Plan Note (Addendum)
 Would benefit from statin therapy

## 2024-01-12 NOTE — Assessment & Plan Note (Addendum)
 S/p IM nail fixation of R intertrochanteric fracture on 10/31 Has completed STR at [facility] and is back in LTC at this time

## 2024-01-12 NOTE — Discharge Summary (Signed)
 " Physician Discharge Summary   Patient: Andrea Kirk MRN: [id number] DOB: 25-Feb-1940  Admit date:     01/06/2024  Discharge date: 01/12/2024  Discharge Physician: Delon Herald   PCP: System, Provider Not In   Recommendations at discharge:   You are being discharged back to long-term care at [facility] BIPAP has been ordered for apnea and should be worn for sleep (including naps) An outpatient sleep study is recommended Stop amlodipine  Daily vital signs with pulse ox recommended Daily weights will need to be performed; give PRN Lasix  for weight increase > 3 pounds  Discharge Diagnoses: Principal Problem:   Acute on chronic respiratory failure with hypoxia and hypercapnia (HCC) Active Problems:   Coronary artery disease   Controlled type 2 diabetes mellitus with hyperglycemia (HCC)   Essential hypertension   HLD (hyperlipidemia)   Anxiety and depression   Acute on chronic diastolic CHF (congestive heart failure) (HCC)   Gastro-esophageal reflux disease without esophagitis   Peripheral neuropathy   Persistent atrial fibrillation (HCC)   History of hip fracture   Pressure injury   Sleep apnea    Hospital Course: 83yo with h/o CVA, depression/anxiety, CAD s/p CABG, HTN, T2DM, and HLD who presented on 12/30 with SOB. O2 sats were in the 50s on presentation. Troponin 25, 27. Pro-BNP 1832. CXR with mild pulmonary edema and small to moderate B effusions. Given IV Lasix  and started on supplemental O2 for CHF exacerbation.   Assessment and Plan:  Assessment & Plan Acute on chronic respiratory failure with hypoxia and hypercapnia (HCC) Patient is on 2L home O2 Presented with O2 sats in the 50s at SNF Elevated BNP, mildly elevated troponin - likely some component of CHF exacerbation Last echo was in 10/2023 and showed preserved EF (actually hyperdynamic) and grade 2 diastolic dysfunction Repeat echocardiogram limited showed EF 50-55% with RV volume overload which may be related to  CHF or could be from underlying lung disease CTA negative for PE She has continued to have episodes of O2 sats in the 60s with apnea ABG shows pCO2 62, possibly related to COPD and/or sleep disordered breathing Will dc back to LTC with BIPAP (vs. Non-invasive ventilator depending on insurance) Discussed with Dr. Shelah, who agrees Facility could not obtain BIPAP until 1/5 so held dc and plan to dc itoday Acute on chronic diastolic CHF (congestive heart failure) (HCC) Hypoxia with elevated BNP, mildly elevated troponin on presentation - appears to be c/w CHF exacerbation Treated with furosemide  20 mg IVP twice daily Repeat echocardiogram limited showed EF 50-55% with RV volume overload Edema has resolved,  transitioned to PO Lasix  but now appears effectively diuresed and renal function is uptrending; will resume prn Lasix  at this time for weight increase > 3 pounds Sleep apnea She has continued to have periodic precipitous drops in her O2 levels with sleep and appears to have apnea ABG with chronic hypercapnic respiratory failure, qualifying her for home BIPAP She will need an outpatient sleep study Coronary artery disease Continue  carvedill Controlled type 2 diabetes mellitus with hyperglycemia (HCC) Last A1c was 5.7, good control Persistent hyperglycemia while here; will increase glargine to 10 units qhs Cover with moderate-scale SSI Carb modified diet  Essential hypertension Hold amlodipine  Continue carvedilol  6.25 mg p.o. twice daily HLD (hyperlipidemia) Would benefit from statin therapy Anxiety and depression Continue mirtazapine , buspirone , and melatonin  Gastro-esophageal reflux disease without esophagitis Continue PPI Peripheral neuropathy Continue gabapentin  and oxycodone  I have reviewed this patient in the [place] Controlled Substances Reporting  System.  She is receiving medications from only one provider and appears to be taking them as prescribed. She is not at particularly  high risk of opioid misuse, diversion, or overdose.  Persistent atrial fibrillation (HCC) Continue apixaban  twice daily Continue carvedilol  twice daily for rate control - HR 97-109, currently 101  History of hip fracture S/p IM nail fixation of R intertrochanteric fracture on 10/31 Has completed STR at [facility] and is back in LTC at this time Pressure injury Wound 01/07/24 2018 Pressure Injury Sacrum Left;Right Deep Tissue Pressure Injury - Purple or maroon localized area of discolored intact skin or blood-filled blister due to damage of underlying soft tissue from pressure and/or shear. (Active)        Consultants: None   Procedures: Echocardiogram 1/1   Antibiotics: None    Pain control - [place]  Controlled Substance Reporting System database was reviewed. and patient was instructed, not to drive, operate heavy machinery, perform activities at heights, swimming or participation in water activities or provide baby-sitting services while on Pain, Sleep and Anxiety Medications; until their outpatient Physician has advised to do so again. Also recommended to not to take more than prescribed Pain, Sleep and Anxiety Medications.   Disposition: Skilled nursing facility Diet recommendation:  Discharge Diet Orders (From admission, onward)     Start     Ordered   01/10/24 0000  Diet - low sodium heart healthy        01/10/24 1248           Cardiac diet DISCHARGE MEDICATION: Allergies as of 01/12/2024       Reactions   Latex Other (See Comments), Rash, Hives   tears skin   Codeine Other (See Comments)   Abnormal behavior   Morphine  And Codeine Other (See Comments)   Affects BP and blood sugar   Cyclobenzaprine Other (See Comments)   MADE PT SICK- pt unsure if med was cyclobenzaprine or methocarbamol    Grapefruit Concentrate Other (See Comments)   Allergic, per [facility]   Jardiance [empagliflozin] Other (See Comments)   Allergic, per [facility]        Medication  List     STOP taking these medications    amLODipine  10 MG tablet Commonly known as: NORVASC    glipiZIDE  5 MG 24 hr tablet Commonly known as: GLUCOTROL  XL       TAKE these medications    acetaminophen  325 MG tablet Commonly known as: TYLENOL  Take 2 tablets (650 mg total) by mouth every 6 (six) hours as needed for mild pain (pain score 1-3) or fever (or Fever >/= 101).   albuterol  (2.5 MG/3ML) 0.083% nebulizer solution Commonly known as: PROVENTIL  Inhale 3 mLs (2.5 mg total) into the lungs every 6 (six) hours as needed for wheezing or shortness of breath.   apixaban  5 MG Tabs tablet Commonly known as: ELIQUIS  Take 1 tablet (5 mg total) by mouth 2 (two) times daily.   artificial tears ophthalmic solution Place 1 drop into both eyes every 4 (four) hours as needed for dry eyes.   bisacodyl  10 MG suppository Commonly known as: DULCOLAX Place 1 suppository (10 mg total) rectally daily as needed for severe constipation. What changed: reasons to take this   busPIRone  10 MG tablet Commonly known as: BUSPAR  Take 1 tablet (10 mg total) by mouth 2 (two) times daily. What changed: when to take this   calcium  carbonate 750 MG chewable tablet Commonly known as: TUMS EX Chew 1 tablet by mouth daily as needed  for heartburn.   Calcium  600+D 600-20 MG-MCG Tabs Generic drug: Calcium  Carb-Cholecalciferol  Take 1 tablet by mouth in the morning and at bedtime.   calcium -vitamin D  500-5 MG-MCG tablet Commonly known as: OSCAL WITH D Take 1 tablet by mouth 2 (two) times daily with a meal.   carvedilol  6.25 MG tablet Commonly known as: COREG  Take 6.25 mg by mouth in the morning and at bedtime.   citalopram  20 MG tablet Commonly known as: CELEXA  Take 20 mg by mouth daily.   cyanocobalamin  1000 MCG tablet Take 1,000 mcg by mouth daily.   docusate sodium  100 MG capsule Commonly known as: COLACE Take 100 mg by mouth in the morning and at bedtime.   estradiol 0.01 % Crea vaginal  cream Commonly known as: ESTRACE Place 1 g vaginally daily.   ferrous sulfate  325 (65 FE) MG tablet Take 325 mg by mouth every Monday, Tuesday, Wednesday, Thursday, and Friday.   furosemide  20 MG tablet Commonly known as: LASIX  Take 1 tablet (20 mg total) by mouth daily as needed for fluid or edema. take once daily as needed for weight gain of 2-3 lbs overnight or 5 lbs in 1 week. What changed:  reasons to take this additional instructions   gabapentin  300 MG capsule Commonly known as: NEURONTIN  Take 1 capsule (300 mg total) by mouth 2 (two) times daily. What changed: when to take this   insulin  aspart 100 UNIT/ML injection Commonly known as: novoLOG  Inject 0-15 Units into the skin 3 (three) times daily with meals. 0-15 Units, Subcutaneous, 3 times daily with meals, First dose on Mon 11/03/23 at 1700 Correction coverage: Moderate (average weight, post-op) CBG < 70: Implement Hypoglycemia Standing Orders and refer to Hypoglycemia Standing Orders sidebar report CBG 70 - 120: 0 units CBG 121 - 150: 2 units CBG 151 - 200: 3 units CBG 201 - 250: 5 units CBG 251 - 300: 8 units CBG 301 - 350: 11 units CBG 351 - 400: 15 units What changed:  how much to take when to take this additional instructions   ipratropium-albuterol  0.5-2.5 (3) MG/3ML Soln Commonly known as: DUONEB Take 3 mLs by nebulization every 6 (six) hours as needed (for wheezing or shortness of breath).   Lantus  SoloStar 100 UNIT/ML Solostar Pen Generic drug: insulin  glargine Inject 5 Units into the skin at bedtime.   liver oil-zinc  oxide 40 % ointment Commonly known as: DESITIN Apply topically 2 (two) times daily.   Magnesium  Oxide -Mg Supplement 250 MG Tabs Take 250 mg by mouth daily.   melatonin 3 MG Tabs tablet Take 3 mg by mouth at bedtime.   methocarbamol  500 MG tablet Commonly known as: ROBAXIN  Take 500 mg by mouth every 8 (eight) hours as needed for muscle spasms.   mirtazapine  7.5 MG tablet Commonly  known as: REMERON  Take 7.5 mg by mouth at bedtime.   oxybutynin  10 MG 24 hr tablet Commonly known as: DITROPAN -XL Take 10 mg by mouth daily.   oxyCODONE  5 MG immediate release tablet Commonly known as: Oxy IR/ROXICODONE  Take 1 tablet (5 mg total) by mouth every 6 (six) hours as needed for severe pain (pain score 7-10). What changed: reasons to take this   OXYGEN  Inhale 2 L/min into the lungs continuous.   pantoprazole  40 MG tablet Commonly known as: PROTONIX  Take 1 tablet (40 mg total) by mouth 2 (two) times daily. What changed: when to take this   polyethylene glycol 17 g packet Commonly known as: MIRALAX  / GLYCOLAX  Take 17  g by mouth daily as needed for moderate constipation. What changed: reasons to take this   Refresh Tears 0.5 % Soln Generic drug: carboxymethylcellulose Place 1 drop into both eyes every 4 (four) hours as needed (for dryness).   saccharomyces boulardii 250 MG capsule Commonly known as: FLORASTOR Take 250 mg by mouth daily.   senna 8.6 MG tablet Commonly known as: SENOKOT Take 2 tablets by mouth at bedtime.               Durable Medical Equipment  (From admission, onward)           Start     Ordered   01/12/24 0856  For home use only DME Bipap  Once       Comments: 14/7  Question Answer Comment  Length of Need Lifetime   Bleed in oxygen  (LPM) 2   Inspiratory pressure 12   Expiratory pressure 5      01/12/24 0855              Discharge Care Instructions  (From admission, onward)           Start     Ordered   01/12/24 0000  Discharge wound care:       Comments: Cleanse sacrum/buttocks with soap and water, dry and apply a thin layer of Desitin 2 times daily and prn soiling.  Cover with silicone foam or ABD pad and tape whichever is preferred/works best   01/12/24 0857            Discharge Exam:   Subjective: Feeling ok but she is not delighted about returning to her facility.  Otherwise, no new  concerns.   Objective: Vitals:   01/12/24 0311 01/12/24 0600  BP:  119/68  Pulse:  78  Resp: 12 14  Temp:  97.8 F (36.6 C)  SpO2:  100%    Intake/Output Summary (Last 24 hours) at 01/12/2024 0858 Last data filed at 01/12/2024 0600 Gross per 24 hour  Intake 480 ml  Output 1750 ml  Net -1270 ml   Filed Weights   01/10/24 0500 01/11/24 0728 01/12/24 0500  Weight: 81.1 kg 81.1 kg 77.5 kg    Exam:  General:  Appears calm and comfortable and is in NAD, on home O2 Eyes:  normal lids, iris ENT:  grossly normal hearing, lips & tongue, mmm Cardiovascular:  RRR. No LE edema.  Respiratory:   CTA bilaterally with no wheezes/rales/rhonchi.  Normal respiratory effort. Abdomen:  soft, NT, ND Skin:  no rash or induration seen on limited exam Musculoskeletal:  grossly normal tone BUE/BLE, good ROM, no bony abnormality Psychiatric: blunted mood and affect, speech fluent and appropriate, AOx3 Neurologic:  CN 2-12 grossly intact, moves all extremities in coordinated fashion  Data Reviewed: I have reviewed the patient's lab results since admission.  Pertinent labs for today include:   CO2 37 Glucose 393 WBC 4.7 Hgb 8.4    Condition at discharge: stable  The results of significant diagnostics from this hospitalization (including imaging, microbiology, ancillary and laboratory) are listed below for reference.   Imaging Studies: CT Angio Chest Pulmonary Embolism (PE) W or WO Contrast Result Date: 01/08/2024 CLINICAL DATA:  Concern for pulmonary embolism. EXAM: CT ANGIOGRAPHY CHEST WITH CONTRAST TECHNIQUE: Multidetector CT imaging of the chest was performed using the standard protocol during bolus administration of intravenous contrast. Multiplanar CT image reconstructions and MIPs were obtained to evaluate the vascular anatomy. RADIATION DOSE REDUCTION: This exam was performed according to the departmental  dose-optimization program which includes automated exposure control, adjustment of  the mA and/or kV according to patient size and/or use of iterative reconstruction technique. CONTRAST:  75mL OMNIPAQUE  IOHEXOL  350 MG/ML SOLN COMPARISON:  CT dated 11/03/2023. FINDINGS: Cardiovascular: There is cardiomegaly with biatrial dilatation. There is coronary vascular calcification and postsurgical changes of CABG. Mild atherosclerotic calcification of the thoracic aorta. No intimal dilatation. The origins of the great vessels of the aortic arch appear patent as visualized. No pulmonary artery embolus identified. Mediastinum/Nodes: No hilar or mediastinal adenopathy. The esophagus is grossly unremarkable. No mediastinal fluid collection. Lungs/Pleura: Small bilateral pleural effusions. There is partial compressive atelectasis of the lower lobes versus pneumonia. Clinical correlation is recommended. No pneumothorax. The central airways are patent. Upper Abdomen: Cirrhosis. Musculoskeletal: Osteopenia with degenerative changes of the spine. Median sternotomy wires. No acute osseous pathology. Review of the MIP images confirms the above findings. IMPRESSION: 1. No CT evidence of pulmonary artery embolus. 2. Small bilateral pleural effusions with partial compressive atelectasis of the lower lobes versus pneumonia. 3. Cardiomegaly with biatrial dilatation. 4. Cirrhosis. 5.  Aortic Atherosclerosis (ICD10-I70.0). Electronically Signed   By: Vanetta Chou M.D.   On: 01/08/2024 14:38   ECHOCARDIOGRAM LIMITED Result Date: 01/08/2024    ECHOCARDIOGRAM LIMITED REPORT   Patient Name:   Sage L Boivin Date of Exam: 01/08/2024 Medical Rec #:  [id number]     Height:       62.0 in Accession #:    [id number]    Weight:       181.2 lb Date of Birth:  1940/02/11      BSA:          1.833 m Patient Age:    83 years      BP:           103/71 mmHg Patient Gender: F             HR:           91 bpm. Exam Location:  Inpatient Procedure: Limited Echo, Color Doppler and Cardiac Doppler (Both Spectral and            Color Flow Doppler  were utilized during procedure). Indications:    CHF I50.31  History:        Patient has prior history of Echocardiogram examinations, most                 recent 11/04/2023. CHF, Pericardial Disease; Risk                 Factors:Hypertension.  Sonographer:    Tinnie Gosling RDCS Referring Phys: [id number] Rithy Mandley IMPRESSIONS  1. Left ventricular ejection fraction, by estimation, is 50 to 55%. The left ventricle has low normal function. The left ventricle has no regional wall motion abnormalities. Diastology indterminate due to atrial fibrillation. There is the interventricular septum is flattened in diastole ('D' shaped left ventricle), consistent with right ventricular volume overload.  2. The IVC was poorly visualized for definitive RVSP calculation. Right ventricular systolic function is moderately reduced. The right ventricular size is moderately enlarged.  3. Right atrial size was mildly dilated. Comparison(s): Changes from prior study are noted. Slight reduction of LVEF to 50-55%. Moderate enlargement of RV with moderate RV systolic dysfunction. Conclusion(s)/Recommendation(s): Consider evaluating for pulmonary embolus given right ventricular enlargement and hypokinesis. FINDINGS  Left Ventricle: Left ventricular ejection fraction, by estimation, is 50 to 55%. The left ventricle has low normal function. The left ventricle has no regional wall motion abnormalities.  The left ventricular internal cavity size was normal in size. There is no left ventricular hypertrophy. The interventricular septum is flattened in diastole ('D' shaped left ventricle), consistent with right ventricular volume overload. Diastology indterminate due to atrial fibrillation. Right Ventricle: The IVC was poorly visualized for definitive RVSP calculation. The right ventricular size is moderately enlarged. No increase in right ventricular wall thickness. Right ventricular systolic function is moderately reduced. Left Atrium: Left atrial  size was normal in size. Right Atrium: Right atrial size was mildly dilated. Pericardium: There is no evidence of pericardial effusion. Tricuspid Valve: The tricuspid valve is normal in structure. Tricuspid valve regurgitation is mild . No evidence of tricuspid stenosis. Venous: The inferior vena cava was not well visualized. LEFT VENTRICLE PLAX 2D LVIDd:         4.40 cm LVIDs:         2.80 cm LV PW:         1.00 cm LV IVS:        0.90 cm  LV Volumes (MOD) LV vol d, MOD A4C: 72.7 ml LV vol s, MOD A4C: 35.4 ml LV SV MOD A4C:     72.7 ml RIGHT ATRIUM           Index RA Area:     22.70 cm RA Volume:   73.60 ml  40.15 ml/m  TRICUSPID VALVE TR Peak grad:   32.5 mmHg TR Vmax:        285.00 cm/s Georganna Archer Electronically signed by Georganna Archer Signature Date/Time: 01/08/2024/8:54:54 AM    Final    DG Chest Portable 1 View Result Date: 01/06/2024 EXAM: 1 VIEW XRAY OF THE CHEST 01/06/2024 03:50:00 AM COMPARISON: 11/03/2023 CLINICAL HISTORY: SOB FINDINGS: LUNGS AND PLEURA: Small to moderate bilateral pleural effusions. Perihilar airspace opacities. Mild pulmonary edema. No pneumothorax. HEART AND MEDIASTINUM: Heart size at upper limits. Aortic atherosclerosis. CABG markers noted. BONES AND SOFT TISSUES: Sternotomy wires noted. IMPRESSION: 1. Mild pulmonary edema and small to moderate bilateral pleural effusions. Electronically signed by: Waddell Calk MD 01/06/2024 05:06 AM EST RP Workstation: [id number]    Microbiology: Results for orders placed or performed during the hospital encounter of 01/06/24  Resp panel by RT-PCR (RSV, Flu A&B, Covid) Anterior Nasal Swab     Status: None   Collection Time: 01/06/24  3:59 AM   Specimen: Anterior Nasal Swab  Result Value Ref Range Status   SARS Coronavirus 2 by RT PCR NEGATIVE NEGATIVE Final    Comment: (NOTE) SARS-CoV-2 target nucleic acids are NOT DETECTED.  The SARS-CoV-2 RNA is generally detectable in upper respiratory specimens during the acute phase of  infection. The lowest concentration of SARS-CoV-2 viral copies this assay can detect is 138 copies/mL. A negative result does not preclude SARS-Cov-2 infection and should not be used as the sole basis for treatment or other patient management decisions. A negative result may occur with  improper specimen collection/handling, submission of specimen other than nasopharyngeal swab, presence of viral mutation(s) within the areas targeted by this assay, and inadequate number of viral copies(<138 copies/mL). A negative result must be combined with clinical observations, patient history, and epidemiological information. The expected result is Negative.  Fact Sheet for Patients:  bloggercourse.com  Fact Sheet for Healthcare Providers:  seriousbroker.it  This test is no t yet approved or cleared by the [place]  FDA and  has been authorized for detection and/or diagnosis of SARS-CoV-2 by FDA under an Emergency Use Authorization (EUA). This EUA will remain  in  effect (meaning this test can be used) for the duration of the COVID-19 declaration under Section 564(b)(1) of the Act, 21 U.S.C.section 360bbb-3(b)(1), unless the authorization is terminated  or revoked sooner.       Influenza A by PCR NEGATIVE NEGATIVE Final   Influenza B by PCR NEGATIVE NEGATIVE Final    Comment: (NOTE) The Xpert Xpress SARS-CoV-2/FLU/RSV plus assay is intended as an aid in the diagnosis of influenza from Nasopharyngeal swab specimens and should not be used as a sole basis for treatment. Nasal washings and aspirates are unacceptable for Xpert Xpress SARS-CoV-2/FLU/RSV testing.  Fact Sheet for Patients: bloggercourse.com  Fact Sheet for Healthcare Providers: seriousbroker.it  This test is not yet approved or cleared by the [place]  FDA and has been authorized for detection and/or diagnosis of SARS-CoV-2  by FDA under an Emergency Use Authorization (EUA). This EUA will remain in effect (meaning this test can be used) for the duration of the COVID-19 declaration under Section 564(b)(1) of the Act, 21 U.S.C. section 360bbb-3(b)(1), unless the authorization is terminated or revoked.     Resp Syncytial Virus by PCR NEGATIVE NEGATIVE Final    Comment: (NOTE) Fact Sheet for Patients: bloggercourse.com  Fact Sheet for Healthcare Providers: seriousbroker.it  This test is not yet approved or cleared by the [place]  FDA and has been authorized for detection and/or diagnosis of SARS-CoV-2 by FDA under an Emergency Use Authorization (EUA). This EUA will remain in effect (meaning this test can be used) for the duration of the COVID-19 declaration under Section 564(b)(1) of the Act, 21 U.S.C. section 360bbb-3(b)(1), unless the authorization is terminated or revoked.  Performed at [facility], [address], [place], [place] [address]   MRSA Next Gen by PCR, Nasal     Status: None   Collection Time: 01/06/24  2:45 PM   Specimen: Nasal Mucosa; Nasal Swab  Result Value Ref Range Status   MRSA by PCR Next Gen NOT DETECTED NOT DETECTED Final    Comment: (NOTE) The GeneXpert MRSA Assay (FDA approved for NASAL specimens only), is one component of a comprehensive MRSA colonization surveillance program. It is not intended to diagnose MRSA infection nor to guide or monitor treatment for MRSA infections. Test performance is not FDA approved in patients less than 61 years old. Performed at [facility], [address] [place], [place] [address]     Labs: CBC: Recent Labs  Lab 01/06/24 0327 01/07/24 0239 01/08/24 0754 01/11/24 1035 01/12/24 0511  WBC 10.7* 4.6 6.5 5.3 4.7  NEUTROABS 8.5*  --   --   --   --   HGB 9.2* 8.6* 9.4* 9.3* 8.4*  HCT 30.0* 27.9* 29.8* 29.0* 27.1*  MCV 106.4* 104.5*  103.1* 100.0 101.5*  PLT 192 146* 184 219 198   Basic Metabolic Panel: Recent Labs  Lab 01/06/24 0327 01/07/24 0239 01/08/24 0754 01/11/24 1035 01/12/24 0511  NA 137 137 135 136 137  K 4.9 4.3 4.6 3.0* 3.8  CL 94* 95* 92* 89* 93*  CO2 35* 37* 35* 38* 37*  GLUCOSE 294* 283* 197* 293* 393*  BUN 13 15 13 20 23   CREATININE 1.09* 1.03* 0.79 0.91 0.93  CALCIUM  10.0 9.8 10.1 9.7 9.4   Liver Function Tests: Recent Labs  Lab 01/06/24 0327  AST 30  ALT 10  ALKPHOS 125  BILITOT 1.0  PROT 7.6  ALBUMIN  3.8   CBG: Recent Labs  Lab 01/11/24 0734 01/11/24 1131 01/11/24 1625 01/11/24 2112 01/12/24  0732  GLUCAP 271* 272* 157* 239* 361*    Discharge time spent: greater than 30 minutes.  Signed: Delon Herald, MD [facility] 01/12/2024 "

## 2024-01-12 NOTE — Assessment & Plan Note (Addendum)
 Hold amlodipine  Continue carvedilol  6.25 mg p.o. twice daily

## 2024-01-12 NOTE — Assessment & Plan Note (Addendum)
"  Continue PPI  "

## 2024-01-12 NOTE — Progress Notes (Signed)
 Verbal report given to Vernell, Day Nurse Supervisor/[facility] @ [phone] per instructions.

## 2024-01-12 NOTE — Plan of Care (Signed)
" °  Problem: Skin Integrity: Goal: Risk for impaired skin integrity will decrease Outcome: Adequate for Discharge   Problem: Clinical Measurements: Goal: Will remain free from infection Outcome: Adequate for Discharge   Problem: Clinical Measurements: Goal: Diagnostic test results will improve Outcome: Adequate for Discharge   Problem: Clinical Measurements: Goal: Respiratory complications will improve Outcome: Adequate for Discharge   Problem: Clinical Measurements: Goal: Cardiovascular complication will be avoided Outcome: Adequate for Discharge   "

## 2024-01-12 NOTE — Assessment & Plan Note (Addendum)
 Patient is on 2L home O2 Presented with O2 sats in the 50s at SNF Elevated BNP, mildly elevated troponin - likely some component of CHF exacerbation Last echo was in 10/2023 and showed preserved EF (actually hyperdynamic) and grade 2 diastolic dysfunction Repeat echocardiogram limited showed EF 50-55% with RV volume overload which may be related to CHF or could be from underlying lung disease CTA negative for PE She has continued to have episodes of O2 sats in the 60s with apnea ABG shows pCO2 62, possibly related to COPD and/or sleep disordered breathing Will dc back to LTC with BIPAP (vs. Non-invasive ventilator depending on insurance) Discussed with Dr. Shelah, who agrees Facility could not obtain BIPAP until 1/5 so held dc and plan to dc itoday

## 2024-01-12 NOTE — Assessment & Plan Note (Addendum)
 Continue mirtazapine , buspirone , and melatonin

## 2024-01-12 NOTE — Care Management Important Message (Signed)
 Important Message  Patient Details IM Letter given. Name: Andrea Kirk MRN: [id number] Date of Birth: [birth year]   Important Message Given:  Yes - Medicare IM     Andrea Kirk 01/12/2024, 9:29 AM

## 2024-01-12 NOTE — Assessment & Plan Note (Addendum)
 Continue gabapentin  and oxycodone  I have reviewed this patient in the [place] Controlled Substances Reporting System.  She is receiving medications from only one provider and appears to be taking them as prescribed. She is not at particularly high risk of opioid misuse, diversion, or overdose.

## 2024-01-12 NOTE — Assessment & Plan Note (Addendum)
 Last A1c was 5.7, good control Persistent hyperglycemia while here; will increase glargine to 10 units qhs Cover with moderate-scale SSI Carb modified diet

## 2024-01-16 NOTE — Progress Notes (Unsigned)
 " [facility] [address] [place]. [place] [address] [phone]       HOSPITAL FOLLOW UP NOTE  [name] Date of Birth:  01/20/1940 Medical Record Number:  [id number]   Reason for Referral:  hospital stroke follow up    SUBJECTIVE:   CHIEF COMPLAINT:  No chief complaint on file.   HPI:   [name] is a 84 y.o. female with history of hypertension, hyperlipidemia, diabetes, CAD s/p CABG, CVA, carotid artery disease s/p left CAE in 2015, right CAE in 2018, CKD 2, GERD, RLS, diastolic CHF, depression, anxiety, chronic respiratory failure, chronic pain who presented to [facility] ED on 11/03/2023 with altered mental status.  Stroke workup revealed multifocal bilateral anterior and posterior circulation infarcts, largest at left cerebellum, likely embolic etiology.  Further workup as noted below.  Recommended ILR for monitoring of A-fib.  Recommended DAPT for 3 weeks and aspirin  alone and continuation of Crestor  once LFTs normalize.  DM and HTN controlled on home regimen.SABRA  History of prior stroke in 2022 with recommended ILR which she preferred to do outpatient but never pursued.  She was discharged back to SNF where she was previously at for rehab post recent right hip surgery.        PERTINENT IMAGING  Code Stroke CT head: Multiple acute to subacute appearing infarcts in both cerebral hemispheres and the left cerebellum (largest). Pattern suggests a recent embolic event. No hemorrhagic transformation or intracranial mass effect.  MRI: Multifocal acute infarcts with dominant involvement of the left cerebellum in the left PICA territory with associated edema causing mild local mass effect. Additional acute infarcts in the right cerebellum, left frontal lobe, bilateral posterior frontal and parietal lobes, right occipital lobe, and left occipital cortex. MRA: No acute findings. 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 Mild atherosclerotic  irregularity of the bilateral carotid siphons.  2D Echo EF >75% Bilateral LE venous US  - negative for DVT Carotid duplex US  - L ICA 40-59% stenosis  LDL 32 HgbA1c 5.7    ROS:   14 system review of systems performed and negative with exception of ***  PMH:  Past Medical History:  Diagnosis Date   Acute kidney injury 08/05/2016   Acute respiratory failure with hypoxia (HCC) 04/19/2020   Acute stroke due to ischemia [facility])    Aftercare following surgery of the circulatory system, NEC 05/11/2013   AKI (acute kidney injury) 08/04/2016   Anxiety    takes Celexa  daily   ARF (acute renal failure) 11/07/2016   Asymptomatic stenosis of right carotid artery 03/22/2016   Back pain    occasionally   Carotid artery disease 04/16/2013   Carotid artery occlusion    Carotid stenosis 11/16/2013   Cataract    left and immature   Coronary artery disease    Coronary atherosclerosis of native coronary artery 03/11/2013   S/p CABG in 1997    Depression    Diabetes mellitus    takes Metformin  and Glipizide  daily   Diabetes mellitus (HCC) 05/02/2015   Dizziness    takes Meclizine  daily as needed   Essential hypertension, benign 03/11/2013   GERD (gastroesophageal reflux disease)    takes Omeprazole daily as needed   Headache(784.0)    Hyperlipidemia    takes Atorvastatin  daily   Hypertension    takes Carvedilol  daily   Mixed hyperlipidemia 03/11/2013   Muscle spasm    takes Robaxin  daily as needed   Nausea  takes Phenergan  daily as needed   Occlusion and stenosis of carotid artery without mention of cerebral infarction 07/19/2011   Pneumonia    hx of-in high school   Restless leg    takes Requip  daily as needed   Seasonal allergies    takes Claritin  daily as needed and Afrin as needed   Severe sepsis (HCC) 02/14/2021   Shortness of breath    with exertion   Urinary urgency    UTI (urinary tract infection) 11/07/2016    PSH:  Past Surgical History:  Procedure Laterality  Date   COLONOSCOPY WITH PROPOFOL  N/A 03/10/2012   Procedure: COLONOSCOPY WITH PROPOFOL ;  Surgeon: Gladis MARLA Louder, MD;  Location: [facility]COPY;  Service: Endoscopy;  Laterality: N/A;   CORNEAL TRANSPLANT Right    CORONARY ARTERY BYPASS GRAFT  1997   x 6   CORONARY ARTERY BYPASS GRAFT  Jan. 1997   ENDARTERECTOMY Left 04/16/2013   Procedure: Left Carotid Artery Endatarectomy with Resection of Redundant Internal Carotid Artery;  Surgeon: Krystal JULIANNA Doing, MD;  [facility] OR;  Service: Vascular;  Laterality: Left;   ENDARTERECTOMY Right 03/22/2016   Procedure: RIGHT ENDARTERECTOMY CAROTID;  Surgeon: Krystal JULIANNA Doing, MD;  Location: [facility] OR;  Service: Vascular;  Laterality: Right;   ESOPHAGOGASTRODUODENOSCOPY N/A 03/10/2012   Procedure: ESOPHAGOGASTRODUODENOSCOPY (EGD);  Surgeon: Gladis MARLA Louder, MD;  Location: [facility];  Service: Endoscopy;  Laterality: N/A;   EYE SURGERY  March 12, 2001   CORNEA TRANSPLANT Right eye   INTRAMEDULLARY (IM) NAIL INTERTROCHANTERIC Right 10/22/2023   Procedure: FIXATION, FRACTURE, INTERTROCHANTERIC, WITH INTRAMEDULLARY ROD;  Surgeon: Reyne Cordella SQUIBB, MD;  Location: [facility] OR;  Service: Orthopedics;  Laterality: Right;   LOOP RECORDER INSERTION N/A 11/07/2023   Procedure: LOOP RECORDER INSERTION;  Surgeon: Lesia Ozell Barter, PA-C;  Location: [facility] INVASIVE CV LAB;  Service: Cardiovascular;  Laterality: N/A;   PATCH ANGIOPLASTY Right 03/22/2016   Procedure: PATCH ANGIOPLASTY;  Surgeon: Krystal JULIANNA Doing, MD;  Location: [facility] OR;  Service: Vascular;  Laterality: Right;   PR VEIN BYPASS GRAFT,AORTO-FEM-POP  1997   SPINE SURGERY  march 2013   Back surgery   TONSILLECTOMY     TRIGGER FINGER RELEASE Left    thumb    Social History:  Social History   Socioeconomic History   Marital status: Widowed    Spouse name: Not on file   Number of children: Not on file   Years of education: Not on file   Highest education level: Not on file  Occupational History   Not on file  Tobacco  Use   Smoking status: Never   Smokeless tobacco: Never   Tobacco comments:    Never smoked 11/18/23  Vaping Use   Vaping status: Never Used  Substance and Sexual Activity   Alcohol  use: No    Alcohol /week: 0.0 standard drinks of alcohol    Drug use: No   Sexual activity: Never    Birth control/protection: Post-menopausal  Other Topics Concern   Not on file  Social History Narrative   Not on file   Social Drivers of Health   Tobacco Use: Low Risk (01/06/2024)   Patient History    Smoking Tobacco Use: Never    Smokeless Tobacco Use: Never    Passive Exposure: Not on file  Financial Resource Strain: Not on file  Food Insecurity: No Food Insecurity (01/07/2024)   Epic    Worried About Radiation Protection Practitioner of Food in the Last Year: Never true    Ran Out  of Food in the Last Year: Never true  Transportation Needs: No Transportation Needs (01/07/2024)   Epic    Lack of Transportation (Medical): No    Lack of Transportation (Non-Medical): No  Physical Activity: Not on file  Stress: Not on file  Social Connections: Unknown (01/07/2024)   Social Connection and Isolation Panel    Frequency of Communication with Friends and Family: Twice a week    Frequency of Social Gatherings with Friends and Family: Twice a week    Attends Religious Services: More than 4 times per year    Active Member of [employer] or Organizations: Yes    Attends Banker Meetings: More than 4 times per year    Marital Status: Patient declined  Intimate Partner Violence: Not At Risk (01/07/2024)   Epic    Fear of Current or Ex-Partner: No    Emotionally Abused: No    Physically Abused: No    Sexually Abused: No  Depression (PHQ2-9): Not on file  Alcohol  Screen: Not on file  Housing: Low Risk (01/07/2024)   Epic    Unable to Pay for Housing in the Last Year: No    Number of Times Moved in the Last Year: 0    Homeless in the Last Year: No  Utilities: Not At Risk (01/07/2024)   Epic    Threatened with  loss of utilities: No  Health Literacy: Not on file    Family History:  Family History  Problem Relation Age of Onset   Heart attack Father    Heart disease Father 59       Before age of 64   Hyperlipidemia Father    Heart disease Brother        Heart dissease before age 65   Hyperlipidemia Brother    Cirrhosis Mother    Heart attack Paternal Grandfather    Heart disease Paternal Grandfather    Cancer Maternal Grandmother    Alcoholism Maternal Grandfather    Heart attack Paternal Grandmother     Medications:  Medications Ordered Prior to Encounter[1]  Allergies:  Allergies[2]    OBJECTIVE:  Physical Exam  There were no vitals filed for this visit. There is no height or weight on file to calculate BMI. No results found.   General: well developed, well nourished, seated, in no evident distress Head: head normocephalic and atraumatic.   Neck: supple with no carotid or supraclavicular bruits Cardiovascular: regular rate and rhythm, no murmurs Musculoskeletal: no deformity Skin:  no rash/petichiae Vascular:  Normal pulses all extremities   Neurologic Exam Mental Status: Awake and fully alert. Oriented to place and time. Recent and remote memory intact. Attention span, concentration and fund of knowledge appropriate. Mood and affect appropriate.  Cranial Nerves: Fundoscopic exam reveals sharp disc margins. Pupils equal, briskly reactive to light. Extraocular movements full without nystagmus. Visual fields full to confrontation. Hearing intact. Facial sensation intact. Face, tongue, palate moves normally and symmetrically.  Motor: Normal bulk and tone. Normal strength in all tested extremity muscles Sensory.: intact to touch , pinprick , position and vibratory sensation.  Coordination: Rapid alternating movements normal in all extremities. Finger-to-nose and heel-to-shin performed accurately bilaterally. Gait and Station: Arises from chair without difficulty. Stance is  normal. Gait demonstrates normal stride length and balance with ***. Tandem walk and heel toe ***.  Reflexes: 1+ and symmetric. Toes downgoing.     NIHSS  *** Modified Rankin  ***      ASSESSMENT: Christabella L Dilorenzo is a  84 y.o. year old female multifocal bilateral anterior and posterior circulation infarcts with largest at left cerebellum on 11/03/2023 likely embolic etiology of unknown cause. Vascular risk factors include history of stroke 04/2020, HTN, HLD, DM, CAD s/p CABG 1997, PVD s/p b/l CEA, diastolic CHF and advanced age. S/p ILR 11/07/2023 which showed evidence of A-fib 11/10/2023.     PLAN:  Cryptogenic stroke:  Residual deficit: ***.  Continue Eliquis  5 mg twice daily and rosuvastatin  (Crestor ) for secondary stroke prevention managed/prescribed by PCP.   Discussed secondary stroke prevention measures and importance of close PCP follow up for aggressive stroke risk factor management including BP goal<130/90, HLD with LDL goal<70 and DM with A1c.<7 .  Stroke labs 10/2023: LDL 32, A1c 5.7 I have gone over the pathophysiology of stroke, warning signs and symptoms, risk factors and their management in some detail with instructions to go to the closest emergency room for symptoms of concern.  Persistent A-fib: As demonstrated on her ILR 11/2023 CHA2DS2-VASc score 9 Continue Eliquis  5 mg twice daily manage/prescribed by cardiology  continue close follow-up with cardiology    Follow up in *** or call earlier if needed   CC:  [facility] provider: Dr. Rosemarie PCP: System, Provider Not In    I personally spent a total of *** minutes in the care of the patient today including {Time Based Coding:210964241}.    Harlene Bogaert, AGNP-BC  [facility] [address] [place], [place] [address]  Phone [phone] Fax [phone] Note: This document was prepared with digital dictation and possible smart phrase technology. Any transcriptional errors that  result from this process are unintentional.         [1]  Current Outpatient Medications on File Prior to Visit  Medication Sig Dispense Refill   acetaminophen  (TYLENOL ) 325 MG tablet Take 2 tablets (650 mg total) by mouth every 6 (six) hours as needed for mild pain (pain score 1-3) or fever (or Fever >/= 101).     albuterol  (PROVENTIL ) (2.5 MG/3ML) 0.083% nebulizer solution Inhale 3 mLs (2.5 mg total) into the lungs every 6 (six) hours as needed for wheezing or shortness of breath.     apixaban  (ELIQUIS ) 5 MG TABS tablet Take 1 tablet (5 mg total) by mouth 2 (two) times daily. 60 tablet 6   artificial tears ophthalmic solution Place 1 drop into both eyes every 4 (four) hours as needed for dry eyes.     bisacodyl  (DULCOLAX) 10 MG suppository Place 1 suppository (10 mg total) rectally daily as needed for severe constipation. (Patient taking differently: Place 10 mg rectally daily as needed (for constipation).)     busPIRone  (BUSPAR ) 10 MG tablet Take 1 tablet (10 mg total) by mouth 2 (two) times daily. (Patient taking differently: Take 10 mg by mouth in the morning and at bedtime.)     Calcium  Carb-Cholecalciferol  (CALCIUM  600+D) 600-20 MG-MCG TABS Take 1 tablet by mouth in the morning and at bedtime.     calcium  carbonate (TUMS EX) 750 MG chewable tablet Chew 1 tablet by mouth daily as needed for heartburn.     calcium -vitamin D  (OSCAL WITH D) 500-5 MG-MCG tablet Take 1 tablet by mouth 2 (two) times daily with a meal. (Patient not taking: Reported on 01/06/2024)     carvedilol  (COREG ) 6.25 MG tablet Take 6.25 mg by mouth in the morning and at bedtime.     citalopram  (CELEXA ) 20 MG tablet Take 20 mg by mouth daily.     cyanocobalamin  1000 MCG  tablet Take 1,000 mcg by mouth daily.     docusate sodium  (COLACE) 100 MG capsule Take 100 mg by mouth in the morning and at bedtime.     estradiol (ESTRACE) 0.01 % CREA vaginal cream Place 1 g vaginally daily.     ferrous sulfate  325 (65 FE) MG tablet  Take 325 mg by mouth every Monday, Tuesday, Wednesday, Thursday, and Friday.     furosemide  (LASIX ) 20 MG tablet Take 1 tablet (20 mg total) by mouth daily as needed for fluid or edema. take once daily as needed for weight gain of 2-3 lbs overnight or 5 lbs in 1 week. (Patient taking differently: Take 20 mg by mouth daily as needed for fluid or edema (for weight gain of 2-3 lbs overnight or 5 lbs in 1 week).) 90 tablet 3   gabapentin  (NEURONTIN ) 300 MG capsule Take 1 capsule (300 mg total) by mouth 2 (two) times daily. (Patient taking differently: Take 300 mg by mouth in the morning and at bedtime.)     insulin  aspart (NOVOLOG ) 100 UNIT/ML injection Inject 0-15 Units into the skin 3 (three) times daily with meals. 0-15 Units, Subcutaneous, 3 times daily with meals, First dose on Mon 11/03/23 at 1700 Correction coverage: Moderate (average weight, post-op) CBG < 70: Implement Hypoglycemia Standing Orders and refer to Hypoglycemia Standing Orders sidebar report CBG 70 - 120: 0 units CBG 121 - 150: 2 units CBG 151 - 200: 3 units CBG 201 - 250: 5 units CBG 251 - 300: 8 units CBG 301 - 350: 11 units CBG 351 - 400: 15 units (Patient taking differently: Inject 0-5 Units into the skin See admin instructions. Inject 0-5 units into the skin before meals and at bedtime, per sliding scale: BGL <60 = give glucose and CALL MD; 0-150 = give nothing; 151-200 = 1 unit; 201-250 = 2 units; 251-300 = 3 units; 301-350 = 4 units; 351-400 = 5 units; >400 = 5 units and CALL MD)     ipratropium-albuterol  (DUONEB) 0.5-2.5 (3) MG/3ML SOLN Take 3 mLs by nebulization every 6 (six) hours as needed (for wheezing or shortness of breath).     LANTUS  SOLOSTAR 100 UNIT/ML Solostar Pen Inject 5 Units into the skin at bedtime.     liver oil-zinc  oxide (DESITIN) 40 % ointment Apply topically 2 (two) times daily.     Magnesium  Oxide -Mg Supplement 250 MG TABS Take 250 mg by mouth daily.     melatonin 3 MG TABS tablet Take 3 mg by mouth at bedtime.      methocarbamol  (ROBAXIN ) 500 MG tablet Take 500 mg by mouth every 8 (eight) hours as needed for muscle spasms.     mirtazapine  (REMERON ) 7.5 MG tablet Take 7.5 mg by mouth at bedtime.     oxybutynin  (DITROPAN -XL) 10 MG 24 hr tablet Take 10 mg by mouth daily.     oxyCODONE  (OXY IR/ROXICODONE ) 5 MG immediate release tablet Take 1 tablet (5 mg total) by mouth every 6 (six) hours as needed for severe pain (pain score 7-10). (Patient taking differently: Take 5 mg by mouth every 6 (six) hours as needed (for pain).) 10 tablet 0   OXYGEN  Inhale 2 L/min into the lungs continuous.     pantoprazole  (PROTONIX ) 40 MG tablet Take 1 tablet (40 mg total) by mouth 2 (two) times daily. (Patient taking differently: Take 40 mg by mouth in the morning and at bedtime.)     polyethylene glycol (MIRALAX  / GLYCOLAX ) 17 g packet Take  17 g by mouth daily as needed for moderate constipation. (Patient taking differently: Take 17 g by mouth daily as needed for moderate constipation (to be mixed into 4-6 ounces of a beverage or juice).)     REFRESH TEARS 0.5 % SOLN Place 1 drop into both eyes every 4 (four) hours as needed (for dryness).     saccharomyces boulardii (FLORASTOR) 250 MG capsule Take 250 mg by mouth daily.     senna (SENOKOT) 8.6 MG tablet Take 2 tablets by mouth at bedtime.     No current facility-administered medications on file prior to visit.  [2]  Allergies Allergen Reactions   Latex Other (See Comments), Rash and Hives    tears skin   Codeine Other (See Comments)    Abnormal behavior   Morphine  And Codeine Other (See Comments)    Affects BP and blood sugar   Cyclobenzaprine Other (See Comments)    MADE PT SICK- pt unsure if med was cyclobenzaprine or methocarbamol    Grapefruit Concentrate Other (See Comments)    Allergic, per [facility]   Jardiance [Empagliflozin] Other (See Comments)    Allergic, per [facility]   "

## 2024-01-19 ENCOUNTER — Encounter: Payer: Self-pay | Admitting: Adult Health

## 2024-01-19 ENCOUNTER — Inpatient Hospital Stay: Admitting: Adult Health

## 2024-01-29 ENCOUNTER — Telehealth: Payer: Self-pay | Admitting: Adult Health

## 2024-01-29 ENCOUNTER — Ambulatory Visit: Admitting: Gastroenterology

## 2024-01-29 ENCOUNTER — Encounter: Payer: Self-pay | Admitting: Gastroenterology

## 2024-01-29 VITALS — BP 122/82 | HR 82 | Ht 65.0 in | Wt 166.0 lb

## 2024-01-29 DIAGNOSIS — K5909 Other constipation: Secondary | ICD-10-CM

## 2024-01-29 DIAGNOSIS — K224 Dyskinesia of esophagus: Secondary | ICD-10-CM

## 2024-01-29 DIAGNOSIS — K219 Gastro-esophageal reflux disease without esophagitis: Secondary | ICD-10-CM

## 2024-01-29 MED ORDER — HYOSCYAMINE SULFATE 0.125 MG SL SUBL: 0.1250 mg | SUBLINGUAL_TABLET | Freq: Three times a day (TID) | SUBLINGUAL | 0 refills | Status: AC

## 2024-01-29 NOTE — Patient Instructions (Addendum)
 GERD Recommend GERD diet Continue Pantoprazole  twice daily  Swallowing problems Recommend hyoscyamine  before meals three times daily Continue speech therapy  Dysphagia precautions given   Constipation Continue Miralax  po daily  _______________________________________________________  If your blood pressure at your visit was 140/90 or greater, please contact your primary care physician to follow up on this.  _______________________________________________________  If you are age 14 or older, your body mass index should be between 23-30. Your Body mass index is 27.62 kg/m. If this is out of the aforementioned range listed, please consider follow up with your Primary Care Provider.  If you are age 19 or younger, your body mass index should be between 19-25. Your Body mass index is 27.62 kg/m. If this is out of the aformentioned range listed, please consider follow up with your Primary Care Provider.   ________________________________________________________  The [facility] providers would like to encourage you to use MYCHART to communicate with providers for non-urgent requests or questions.  Due to long hold times on the telephone, sending your provider a message by [facility] may be a faster and more efficient way to get a response.  Please allow 48 business hours for a response.  Please remember that this is for non-urgent requests.  _______________________________________________________  [facility] is using a team-based approach to care.  Your team is made up of your doctor and two to three APPS. Our APPS (Nurse Practitioners and Physician Assistants) work with your physician to ensure care continuity for you. They are fully qualified to address your health concerns and develop a treatment plan. They communicate directly with your gastroenterologist to care for you. Seeing the Advanced Practice Practitioners on your physician's team can help you by facilitating care more  promptly, often allowing for earlier appointments, access to diagnostic testing, procedures, and other specialty referrals.   Thank you for trusting me with your gastrointestinal care. Deanna May, FNP-C

## 2024-01-29 NOTE — Progress Notes (Signed)
 "  Chief Complaint: follow-up esophageal dysmotility  Primary GI Doctor: Dr. Charlanne  HPI:  Patient is a  84  year old female patient with past medical history of GERD, hypertension, DM, CHF, who was referred to me by Andrea Agent, DO on 07/08/23 for a complaint of esophageal dysmotility  .    Interval History Patient last seen in GI office on 07/14/23 by myself.  Patient presents for follow-up of GERD and intermittent esophageal dysphagia. Patient living at [facility] and rehab, in a wheelchair.   Patient has history of GERD and currently taking Pantoprazole  40 mg po twice daily. Denies pyrosis or regurgitation. Her biggest complaint today is of esophageal dysphagia. She was never started on the hyoscyamine  at her rehab facility as we discussed in July. Will go ahead and prescribe today. She is still working with speech therapy. Still not interested in EGD due to fragility and age.  She recently had fall in December and fractured her hip requiring surgery.   Patient has bowel movement every 1-2 days. She uses OTC Miralax  as needed. No blood in stool.   No history of stroke.   She uses oxygen  therapy 2L.   Patient taking baby ASA 81 mg po daily.   Nonsmoker. No alcohol  use.   She has one brother who is a doctor in [facility] [employer] [employer] and one friend of over 30 years who lives near by and helps her as needed.   No family history of GI issues or CA.   Wt Readings from Last 3 Encounters:  01/29/24 166 lb (75.3 kg)  01/12/24 170 lb 13.7 oz (77.5 kg)  12/22/23 169 lb 12.1 oz (77 kg)    Past Medical History:  Diagnosis Date   Acute kidney injury 08/05/2016   Acute respiratory failure with hypoxia (HCC) 04/19/2020   Acute stroke due to ischemia [facility])    Aftercare following surgery of the circulatory system, NEC 05/11/2013   AKI (acute kidney injury) 08/04/2016   Anxiety    takes Celexa  daily   ARF (acute renal failure) 11/07/2016   Asymptomatic stenosis of right carotid artery 03/22/2016    Back pain    occasionally   Carotid artery disease 04/16/2013   Carotid artery occlusion    Carotid stenosis 11/16/2013   Cataract    left and immature   Coronary artery disease    Coronary atherosclerosis of native coronary artery 03/11/2013   S/p CABG in 1997    Depression    Diabetes mellitus    takes Metformin  and Glipizide  daily   Diabetes mellitus (HCC) 05/02/2015   Dizziness    takes Meclizine  daily as needed   Essential hypertension, benign 03/11/2013   GERD (gastroesophageal reflux disease)    takes Omeprazole daily as needed   Headache(784.0)    Hyperlipidemia    takes Atorvastatin  daily   Hypertension    takes Carvedilol  daily   Mixed hyperlipidemia 03/11/2013   Muscle spasm    takes Robaxin  daily as needed   Nausea    takes Phenergan  daily as needed   Occlusion and stenosis of carotid artery without mention of cerebral infarction 07/19/2011   Pneumonia    hx of-in high school   Restless leg    takes Requip  daily as needed   Seasonal allergies    takes Claritin  daily as needed and Afrin as needed   Severe sepsis (HCC) 02/14/2021   Shortness of breath    with exertion   Urinary urgency    UTI (urinary tract  infection) 11/07/2016    Past Surgical History:  Procedure Laterality Date   COLONOSCOPY WITH PROPOFOL  N/A 03/10/2012   Procedure: COLONOSCOPY WITH PROPOFOL ;  Surgeon: Gladis MARLA Louder, MD;  Location: [facility];  Service: Endoscopy;  Laterality: N/A;   CORNEAL TRANSPLANT Right    CORONARY ARTERY BYPASS GRAFT  1997   x 6   CORONARY ARTERY BYPASS GRAFT  Jan. 1997   ENDARTERECTOMY Left 04/16/2013   Procedure: Left Carotid Artery Endatarectomy with Resection of Redundant Internal Carotid Artery;  Surgeon: Krystal JULIANNA Doing, MD;  Location: [facility] OR;  Service: Vascular;  Laterality: Left;   ENDARTERECTOMY Right 03/22/2016   Procedure: RIGHT ENDARTERECTOMY CAROTID;  Surgeon: Krystal JULIANNA Doing, MD;  Location: [facility] OR;  Service: Vascular;  Laterality: Right;    ESOPHAGOGASTRODUODENOSCOPY N/A 03/10/2012   Procedure: ESOPHAGOGASTRODUODENOSCOPY (EGD);  Surgeon: Gladis MARLA Louder, MD;  Location: [facility];  Service: Endoscopy;  Laterality: N/A;   EYE SURGERY  March 12, 2001   CORNEA TRANSPLANT Right eye   INTRAMEDULLARY (IM) NAIL INTERTROCHANTERIC Right 10/22/2023   Procedure: FIXATION, FRACTURE, INTERTROCHANTERIC, WITH INTRAMEDULLARY ROD;  Surgeon: Andrea Cordella SQUIBB, MD;  Location: [facility] OR;  Service: Orthopedics;  Laterality: Right;   LOOP RECORDER INSERTION N/A 11/07/2023   Procedure: LOOP RECORDER INSERTION;  Surgeon: Andrea Ozell Barter, PA-C;  Location: [facility] INVASIVE CV LAB;  Service: Cardiovascular;  Laterality: N/A;   PATCH ANGIOPLASTY Right 03/22/2016   Procedure: PATCH ANGIOPLASTY;  Surgeon: Krystal JULIANNA Doing, MD;  Location: [facility] OR;  Service: Vascular;  Laterality: Right;   PR VEIN BYPASS GRAFT,AORTO-FEM-POP  1997   SPINE SURGERY  march 2013   Back surgery   TONSILLECTOMY     TRIGGER FINGER RELEASE Left    thumb    Current Outpatient Medications  Medication Sig Dispense Refill   acetaminophen  (TYLENOL ) 325 MG tablet Take 2 tablets (650 mg total) by mouth every 6 (six) hours as needed for mild pain (pain score 1-3) or fever (or Fever >/= 101).     albuterol  (PROVENTIL ) (2.5 MG/3ML) 0.083% nebulizer solution Inhale 3 mLs (2.5 mg total) into the lungs every 6 (six) hours as needed for wheezing or shortness of breath.     apixaban  (ELIQUIS ) 5 MG TABS tablet Take 1 tablet (5 mg total) by mouth 2 (two) times daily. 60 tablet 6   artificial tears ophthalmic solution Place 1 drop into both eyes every 4 (four) hours as needed for dry eyes.     bisacodyl  (DULCOLAX) 10 MG suppository Place 1 suppository (10 mg total) rectally daily as needed for severe constipation. (Patient taking differently: Place 10 mg rectally daily as needed (for constipation).)     busPIRone  (BUSPAR ) 10 MG tablet Take 1 tablet (10 mg total) by mouth 2 (two) times daily. (Patient taking  differently: Take 10 mg by mouth in the morning and at bedtime.)     Calcium  Carb-Cholecalciferol  (CALCIUM  600+D) 600-20 MG-MCG TABS Take 1 tablet by mouth in the morning and at bedtime.     calcium  carbonate (TUMS EX) 750 MG chewable tablet Chew 1 tablet by mouth daily as needed for heartburn.     calcium -vitamin D  (OSCAL WITH D) 500-5 MG-MCG tablet Take 1 tablet by mouth 2 (two) times daily with a meal.     carvedilol  (COREG ) 6.25 MG tablet Take 6.25 mg by mouth in the morning and at bedtime.     citalopram  (CELEXA ) 20 MG tablet Take 20 mg by mouth daily.     cyanocobalamin  1000 MCG tablet Take  1,000 mcg by mouth daily.     docusate sodium  (COLACE) 100 MG capsule Take 100 mg by mouth in the morning and at bedtime.     estradiol (ESTRACE) 0.01 % CREA vaginal cream Place 1 g vaginally daily.     ferrous sulfate  325 (65 FE) MG tablet Take 325 mg by mouth every Monday, Tuesday, Wednesday, Thursday, and Friday.     furosemide  (LASIX ) 20 MG tablet Take 1 tablet (20 mg total) by mouth daily as needed for fluid or edema. take once daily as needed for weight gain of 2-3 lbs overnight or 5 lbs in 1 week. (Patient taking differently: Take 20 mg by mouth daily as needed for fluid or edema (for weight gain of 2-3 lbs overnight or 5 lbs in 1 week).) 90 tablet 3   gabapentin  (NEURONTIN ) 300 MG capsule Take 1 capsule (300 mg total) by mouth 2 (two) times daily. (Patient taking differently: Take 300 mg by mouth in the morning and at bedtime.)     insulin  aspart (NOVOLOG ) 100 UNIT/ML injection Inject 0-15 Units into the skin 3 (three) times daily with meals. 0-15 Units, Subcutaneous, 3 times daily with meals, First dose on Mon 11/03/23 at 1700 Correction coverage: Moderate (average weight, post-op) CBG < 70: Implement Hypoglycemia Standing Orders and refer to Hypoglycemia Standing Orders sidebar report CBG 70 - 120: 0 units CBG 121 - 150: 2 units CBG 151 - 200: 3 units CBG 201 - 250: 5 units CBG 251 - 300: 8 units CBG  301 - 350: 11 units CBG 351 - 400: 15 units (Patient taking differently: Inject 0-5 Units into the skin See admin instructions. Inject 0-5 units into the skin before meals and at bedtime, per sliding scale: BGL <60 = give glucose and CALL MD; 0-150 = give nothing; 151-200 = 1 unit; 201-250 = 2 units; 251-300 = 3 units; 301-350 = 4 units; 351-400 = 5 units; >400 = 5 units and CALL MD)     ipratropium-albuterol  (DUONEB) 0.5-2.5 (3) MG/3ML SOLN Take 3 mLs by nebulization every 6 (six) hours as needed (for wheezing or shortness of breath).     LANTUS  SOLOSTAR 100 UNIT/ML Solostar Pen Inject 5 Units into the skin at bedtime.     liver oil-zinc  oxide (DESITIN) 40 % ointment Apply topically 2 (two) times daily.     Magnesium  Oxide -Mg Supplement 250 MG TABS Take 250 mg by mouth daily.     melatonin 3 MG TABS tablet Take 3 mg by mouth at bedtime.     methocarbamol  (ROBAXIN ) 500 MG tablet Take 500 mg by mouth every 8 (eight) hours as needed for muscle spasms.     mirtazapine  (REMERON ) 7.5 MG tablet Take 7.5 mg by mouth at bedtime.     oxybutynin  (DITROPAN -XL) 10 MG 24 hr tablet Take 10 mg by mouth daily.     oxyCODONE  (OXY IR/ROXICODONE ) 5 MG immediate release tablet Take 1 tablet (5 mg total) by mouth every 6 (six) hours as needed for severe pain (pain score 7-10). (Patient taking differently: Take 5 mg by mouth every 6 (six) hours as needed (for pain).) 10 tablet 0   OXYGEN  Inhale 2 L/min into the lungs continuous.     pantoprazole  (PROTONIX ) 40 MG tablet Take 1 tablet (40 mg total) by mouth 2 (two) times daily. (Patient taking differently: Take 40 mg by mouth in the morning and at bedtime.)     polyethylene glycol (MIRALAX  / GLYCOLAX ) 17 g packet Take 17 g by  mouth daily as needed for moderate constipation. (Patient taking differently: Take 17 g by mouth daily as needed for moderate constipation (to be mixed into 4-6 ounces of a beverage or juice).)     REFRESH TEARS 0.5 % SOLN Place 1 drop into both eyes  every 4 (four) hours as needed (for dryness).     saccharomyces boulardii (FLORASTOR) 250 MG capsule Take 250 mg by mouth daily.     senna (SENOKOT) 8.6 MG tablet Take 2 tablets by mouth at bedtime.     No current facility-administered medications for this visit.    Allergies as of 01/29/2024 - Review Complete 01/29/2024  Allergen Reaction Noted   Latex Other (See Comments), Rash, and Hives 03/08/2011   Codeine Other (See Comments) 03/08/2011   Morphine  and codeine Other (See Comments) 05/20/2011   Cyclobenzaprine Other (See Comments) 11/07/2016   Grapefruit concentrate Other (See Comments) 01/06/2024   Jardiance [empagliflozin] Other (See Comments) 11/25/2023    Family History  Problem Relation Age of Onset   Heart attack Father    Heart disease Father 35       Before age of 50   Hyperlipidemia Father    Heart disease Brother        Heart dissease before age 73   Hyperlipidemia Brother    Cirrhosis Mother    Heart attack Paternal Grandfather    Heart disease Paternal Grandfather    Cancer Maternal Grandmother    Alcoholism Maternal Grandfather    Heart attack Paternal Grandmother     Review of Systems:    Constitutional: No weight loss, fever, chills, weakness or fatigue HEENT: Eyes: No change in vision               Ears, Nose, Throat:  No change in hearing or congestion Skin: No rash or itching Cardiovascular: No chest pain, chest pressure or palpitations   Respiratory: No SOB or cough Gastrointestinal: See HPI and otherwise negative Genitourinary: No dysuria or change in urinary frequency Neurological: No headache, dizziness or syncope Musculoskeletal: No new muscle or joint pain Hematologic: No bleeding or bruising Psychiatric: No history of depression or anxiety    Physical Exam:  Vital signs: BP 122/82   Pulse 82   Ht 5' 5 (1.651 m)   Wt 166 lb (75.3 kg)   SpO2 97% Comment: on oxygen   BMI 27.62 kg/m   Constitutional:   Pleasant  female appears to be  in NAD, Well developed, Well nourished, alert and cooperative Throat: Oral cavity and pharynx without inflammation, swelling or lesion.  Respiratory: Respirations even and unlabored. Lungs clear to auscultation bilaterally.   No wheezes, crackles, or rhonchi.  Cardiovascular: Normal S1, S2. Regular rate and rhythm. No peripheral edema, cyanosis or pallor.  Gastrointestinal:  Soft, nondistended, nontender. No rebound or guarding. Normal bowel sounds. No appreciable masses or hepatomegaly. Rectal:  Not performed.  Msk:  patient in wheelchair Neurologic:  Alert and  oriented x4;  grossly normal neurologically.  Skin:   Dry and intact without significant lesions or rashes. Psychiatric: Oriented to person, place and time. Demonstrates good judgement and reason without abnormal affect or behaviors.  RELEVANT LABS AND IMAGING: CBC    Latest Ref Rng & Units 01/12/2024    5:11 AM 01/11/2024   10:35 AM 01/08/2024    7:54 AM  CBC  WBC 4.0 - 10.5 K/uL 4.7  5.3  6.5   Hemoglobin 12.0 - 15.0 g/dL 8.4  9.3  9.4   Hematocrit 36.0 -  46.0 % 27.1  29.0  29.8   Platelets 150 - 400 K/uL 198  219  184      CMP     Latest Ref Rng & Units 01/12/2024    5:11 AM 01/11/2024   10:35 AM 01/08/2024    7:54 AM  CMP  Glucose 70 - 99 mg/dL 606  706  802   BUN 8 - 23 mg/dL 23  20  13    Creatinine 0.44 - 1.00 mg/dL 9.06  9.08  9.20   Sodium 135 - 145 mmol/L 137  136  135   Potassium 3.5 - 5.1 mmol/L 3.8  3.0  4.6   Chloride 98 - 111 mmol/L 93  89  92   CO2 22 - 32 mmol/L 37  38  35   Calcium  8.9 - 10.3 mg/dL 9.4  9.7  89.8      Lab Results  Component Value Date   TSH 0.579 02/13/2021   02/15/21 echo- Left ventricular ejection fraction, by estimation, is 50 to 55%.   07/29/23 esophagram IMPRESSION: Technically limited exam, as described above.   Corkscrew appearance of esophagus, with severe esophageal dysmotility.   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 Consider endoscopy for further  evaluation.   Assessment/Plan: Encounter Diagnoses  Name Primary?   Esophageal dysmotility Yes   Gastroesophageal reflux disease, unspecified whether esophagitis present    Chronic constipation   84 year old female patient who presents for follow-up of GERD with esophageal dysphagia. Patient has history of GERD and rehab facility oversees her medications which includes pantoprazole  40mg   p.o. twice daily. Ordered/reviewed Barium swallow. Pt had significant esophageal dysmotility, but also Nasean Zapf have a stricture in thoracic esophagus as well. Radiology does recommend EGD. We had discussed this back in July and she wanted to pursue medication first but does not think she ever was started on it. Will send new prescription to pharmacy used by rehab. Declines EGD. Recommend she continue to work with Speech therapy.  Patient also has chronic constipation that she reports is well-managed with over-the-counter MiraLAX  as needed.  Plan: -Continue pantoprazole  40 mg twice daily -Continue strict GERD diet, no late meals  -Sent in new script for hyoscyamine  0.125 mg SL before meals -continue speech therapy -declined EGD -continue miralax  po daily   Thank you for the courtesy of this consult. Please call me with any questions or concerns.   Sahana Boyland, FNP-C [facility] 01/29/2024, 2:36 PM  Cc: Danner, James, DO  "

## 2024-01-29 NOTE — Telephone Encounter (Signed)
 Appointment details confirmed

## 2024-02-04 NOTE — Progress Notes (Unsigned)
 " [facility] [address] [place]. [place] [address] [phone]       HOSPITAL FOLLOW UP NOTE  Andrea Kirk Date of Birth:  Jun 29, 1940 Medical Record Number:  [id number]   Reason for Referral:  hospital stroke follow up    SUBJECTIVE:   CHIEF COMPLAINT:  No chief complaint on file.   HPI:   Andrea Kirk is a 84 y.o. female with history of hypertension, hyperlipidemia, diabetes, CAD s/p CABG, CVA, carotid artery disease s/p left CAE in 2015, right CAE in 2018, CKD 2, GERD, RLS, diastolic CHF, depression, anxiety, chronic respiratory failure, chronic pain who presented to [facility] ED on 11/03/2023 with altered mental status.  Stroke workup revealed multifocal bilateral anterior and posterior circulation infarcts, largest at left cerebellum, likely embolic etiology.  Further workup as noted below.  Recommended ILR for monitoring of A-fib.  Recommended DAPT for 3 weeks and aspirin  alone and continuation of Crestor  once LFTs normalize.  DM and HTN controlled on home regimen.SABRA  History of prior stroke in 2022 with recommended ILR which she preferred to do outpatient but never pursued.  She was discharged back to SNF where she was previously at for rehab post recent right hip surgery.    Today, 02/05/2024, patient is being seen for initial hospital follow-up.    Hospitalized in December for acute on chronic respiratory failure and CHF exacerbation.  She was discharged back to SNF on BiPAP therapy and recommended outpatient sleep evaluation.        PERTINENT IMAGING  Code Stroke CT head: Multiple acute to subacute appearing infarcts in both cerebral hemispheres and the left cerebellum (largest). Pattern suggests a recent embolic event. No hemorrhagic transformation or intracranial mass effect.  MRI: Multifocal acute infarcts with dominant involvement of the left cerebellum in the left PICA territory with associated edema causing mild local mass effect.  Additional acute infarcts in the right cerebellum, left frontal lobe, bilateral posterior frontal and parietal lobes, right occipital lobe, and left occipital cortex. MRA: No acute findings. 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 Mild atherosclerotic irregularity of the bilateral carotid siphons.  2D Echo EF >75% Bilateral LE venous US  - negative for DVT Carotid duplex US  - L ICA 40-59% stenosis  LDL 32 HgbA1c 5.7    ROS:   14 system review of systems performed and negative with exception of ***  PMH:  Past Medical History:  Diagnosis Date   Acute kidney injury 08/05/2016   Acute respiratory failure with hypoxia (HCC) 04/19/2020   Acute stroke due to ischemia [facility])    Aftercare following surgery of the circulatory system, NEC 05/11/2013   AKI (acute kidney injury) 08/04/2016   Anxiety    takes Celexa  daily   ARF (acute renal failure) 11/07/2016   Asymptomatic stenosis of right carotid artery 03/22/2016   Back pain    occasionally   Carotid artery disease 04/16/2013   Carotid artery occlusion    Carotid stenosis 11/16/2013   Cataract    left and immature   Coronary artery disease    Coronary atherosclerosis of native coronary artery 03/11/2013   S/p CABG in 1997    Depression    Diabetes mellitus    takes Metformin  and Glipizide  daily   Diabetes mellitus (HCC) 05/02/2015   Dizziness    takes Meclizine  daily as needed   Essential hypertension, benign 03/11/2013   GERD (gastroesophageal reflux disease)    takes Omeprazole daily as needed  Headache(784.0)    Hyperlipidemia    takes Atorvastatin  daily   Hypertension    takes Carvedilol  daily   Mixed hyperlipidemia 03/11/2013   Muscle spasm    takes Robaxin  daily as needed   Nausea    takes Phenergan  daily as needed   Occlusion and stenosis of carotid artery without mention of cerebral infarction 07/19/2011   Pneumonia    hx of-in high school   Restless leg    takes Requip  daily as needed   Seasonal  allergies    takes Claritin  daily as needed and Afrin as needed   Severe sepsis (HCC) 02/14/2021   Shortness of breath    with exertion   Urinary urgency    UTI (urinary tract infection) 11/07/2016    PSH:  Past Surgical History:  Procedure Laterality Date   COLONOSCOPY WITH PROPOFOL  N/A 03/10/2012   Procedure: COLONOSCOPY WITH PROPOFOL ;  Surgeon: Gladis MARLA Louder, MD;  Location: [facility];  Service: Endoscopy;  Laterality: N/A;   CORNEAL TRANSPLANT Right    CORONARY ARTERY BYPASS GRAFT  1997   x 6   CORONARY ARTERY BYPASS GRAFT  Jan. 1997   ENDARTERECTOMY Left 04/16/2013   Procedure: Left Carotid Artery Endatarectomy with Resection of Redundant Internal Carotid Artery;  Surgeon: Krystal JULIANNA Doing, MD;  Location: [facility] OR;  Service: Vascular;  Laterality: Left;   ENDARTERECTOMY Right 03/22/2016   Procedure: RIGHT ENDARTERECTOMY CAROTID;  Surgeon: Krystal JULIANNA Doing, MD;  Location: [facility] OR;  Service: Vascular;  Laterality: Right;   ESOPHAGOGASTRODUODENOSCOPY N/A 03/10/2012   Procedure: ESOPHAGOGASTRODUODENOSCOPY (EGD);  Surgeon: Gladis MARLA Louder, MD;  Location: [facility];  Service: Endoscopy;  Laterality: N/A;   EYE SURGERY  March 12, 2001   CORNEA TRANSPLANT Right eye   INTRAMEDULLARY (IM) NAIL INTERTROCHANTERIC Right 10/22/2023   Procedure: FIXATION, FRACTURE, INTERTROCHANTERIC, WITH INTRAMEDULLARY ROD;  Surgeon: Reyne Cordella SQUIBB, MD;  Location: [facility] OR;  Service: Orthopedics;  Laterality: Right;   LOOP RECORDER INSERTION N/A 11/07/2023   Procedure: LOOP RECORDER INSERTION;  Surgeon: Lesia Ozell Barter, PA-C;  Location: [facility] INV[facility] CV LAB;  Service: Cardiovascular;  Laterality: N/A;   PATCH ANGIOPLASTY Right 03/22/2016   Procedure: PATCH ANGIOPLASTY;  Surgeon: Krystal JULIANNA Doing, MD;  Location: [facility] OR;  Service: Vascular;  Laterality: Right;   PR VEIN BYPASS GRAFT,AORTO-FEM-POP  1997   SPINE SURGERY  march 2013   Back surgery   TONSILLECTOMY     TRIGGER FINGER RELEASE Left    thumb    Social  History:  Social History   Socioeconomic History   Marital status: Widowed    Spouse name: Not on file   Number of children: Not on file   Years of education: Not on file   Highest education level: Not on file  Occupational History   Not on file  Tobacco Use   Smoking status: Never   Smokeless tobacco: Never   Tobacco comments:    Never smoked 11/18/23  Vaping Use   Vaping status: Never Used  Substance and Sexual Activity   Alcohol  use: No    Alcohol /week: 0.0 standard drinks of alcohol    Drug use: No   Sexual activity: Never    Birth control/protection: Post-menopausal  Other Topics Concern   Not on file  Social History Narrative   Not on file   Social Drivers of Health   Tobacco Use: Low Risk (01/29/2024)   Patient History    Smoking Tobacco Use: Never    Smokeless Tobacco Use: Never  Passive Exposure: Not on file  Financial Resource Strain: Not on file  Food Insecurity: No Food Insecurity (01/07/2024)   Epic    Worried About Programme Researcher, Broadcasting/film/video in the Last Year: Never true    Ran Out of Food in the Last Year: Never true  Transportation Needs: No Transportation Needs (01/07/2024)   Epic    Lack of Transportation (Medical): No    Lack of Transportation (Non-Medical): No  Physical Activity: Not on file  Stress: Not on file  Social Connections: Unknown (01/07/2024)   Social Connection and Isolation Panel    Frequency of Communication with Friends and Family: Twice a week    Frequency of Social Gatherings with Friends and Family: Twice a week    Attends Religious Services: More than 4 times per year    Active Member of [employer] or Organizations: Yes    Attends Banker Meetings: More than 4 times per year    Marital Status: Patient declined  Intimate Partner Violence: Not At Risk (01/07/2024)   Epic    Fear of Current or Ex-Partner: No    Emotionally Abused: No    Physically Abused: No    Sexually Abused: No  Depression (PHQ2-9): Not on file   Alcohol  Screen: Not on file  Housing: Low Risk (01/07/2024)   Epic    Unable to Pay for Housing in the Last Year: No    Number of Times Moved in the Last Year: 0    Homeless in the Last Year: No  Utilities: Not At Risk (01/07/2024)   Epic    Threatened with loss of utilities: No  Health Literacy: Not on file    Family History:  Family History  Problem Relation Age of Onset   Heart attack Father    Heart disease Father 37       Before age of 31   Hyperlipidemia Father    Heart disease Brother        Heart dissease before age 75   Hyperlipidemia Brother    Cirrhosis Mother    Heart attack Paternal Grandfather    Heart disease Paternal Grandfather    Cancer Maternal Grandmother    Alcoholism Maternal Grandfather    Heart attack Paternal Grandmother     Medications:  Medications Ordered Prior to Encounter[1]  Allergies:  Allergies[2]    OBJECTIVE:  Physical Exam  There were no vitals filed for this visit. There is no height or weight on file to calculate BMI. No results found.   General: well developed, well nourished, seated, in no evident distress Head: head normocephalic and atraumatic.   Neck: supple with no carotid or supraclavicular bruits Cardiovascular: regular rate and rhythm, no murmurs Musculoskeletal: no deformity Skin:  no rash/petichiae Vascular:  Normal pulses all extremities   Neurologic Exam Mental Status: Awake and fully alert. Oriented to place and time. Recent and remote memory intact. Attention span, concentration and fund of knowledge appropriate. Mood and affect appropriate.  Cranial Nerves: Fundoscopic exam reveals sharp disc margins. Pupils equal, briskly reactive to light. Extraocular movements full without nystagmus. Visual fields full to confrontation. Hearing intact. Facial sensation intact. Face, tongue, palate moves normally and symmetrically.  Motor: Normal bulk and tone. Normal strength in all tested extremity muscles Sensory.:  intact to touch , pinprick , position and vibratory sensation.  Coordination: Rapid alternating movements normal in all extremities. Finger-to-nose and heel-to-shin performed accurately bilaterally. Gait and Station: Arises from chair without difficulty. Stance is normal. Gait demonstrates  normal stride length and balance with ***. Tandem walk and heel toe ***.  Reflexes: 1+ and symmetric. Toes downgoing.     NIHSS  *** Modified Rankin  ***      ASSESSMENT: Moriah Shawley Texeira is a 84 y.o. year old female multifocal bilateral anterior and posterior circulation infarcts with largest at left cerebellum on 11/03/2023 likely embolic etiology of unknown cause. Vascular risk factors include history of stroke 04/2020, HTN, HLD, DM, CAD s/p CABG 1997, PVD s/p b/l CEA, diastolic CHF and advanced age. S/p ILR 11/07/2023 which showed evidence of A-fib 11/10/2023.     PLAN:  Cryptogenic stroke:  Residual deficit: ***.  Continue Eliquis  5 mg twice daily and rosuvastatin  (Crestor ) for secondary stroke prevention managed/prescribed by PCP.   Discussed secondary stroke prevention measures and importance of close PCP follow up for aggressive stroke risk factor management including BP goal<130/90, HLD with LDL goal<70 and DM with A1c.<7 .  Stroke labs 10/2023: LDL 32, A1c 5.7 I have gone over the pathophysiology of stroke, warning signs and symptoms, risk factors and their management in some detail with instructions to go to the closest emergency room for symptoms of concern.  Persistent A-fib: As demonstrated on her ILR 11/2023 CHA2DS2-VASc score 9 Continue Eliquis  5 mg twice daily manage/prescribed by cardiology  continue close follow-up with cardiology    Follow up in *** or call earlier if needed   CC:  [facility] provider: Dr. Rosemarie PCP: System, Provider Not In    I personally spent a total of *** minutes in the care of the patient today including {Time Based Coding:210964241}.    Harlene Bogaert,  AGNP-BC  [facility] [address] [place], [place] [address]  Phone [phone] Fax [phone] Note: This document was prepared with digital dictation and possible smart phrase technology. Any transcriptional errors that result from this process are unintentional.         [1]  Current Outpatient Medications on File Prior to Visit  Medication Sig Dispense Refill   acetaminophen  (TYLENOL ) 325 MG tablet Take 2 tablets (650 mg total) by mouth every 6 (six) hours as needed for mild pain (pain score 1-3) or fever (or Fever >/= 101).     albuterol  (PROVENTIL ) (2.5 MG/3ML) 0.083% nebulizer solution Inhale 3 mLs (2.5 mg total) into the lungs every 6 (six) hours as needed for wheezing or shortness of breath.     apixaban  (ELIQUIS ) 5 MG TABS tablet Take 1 tablet (5 mg total) by mouth 2 (two) times daily. 60 tablet 6   artificial tears ophthalmic solution Place 1 drop into both eyes every 4 (four) hours as needed for dry eyes.     bisacodyl  (DULCOLAX) 10 MG suppository Place 1 suppository (10 mg total) rectally daily as needed for severe constipation. (Patient taking differently: Place 10 mg rectally daily as needed (for constipation).)     busPIRone  (BUSPAR ) 10 MG tablet Take 1 tablet (10 mg total) by mouth 2 (two) times daily. (Patient taking differently: Take 10 mg by mouth in the morning and at bedtime.)     Calcium  Carb-Cholecalciferol  (CALCIUM  600+D) 600-20 MG-MCG TABS Take 1 tablet by mouth in the morning and at bedtime.     calcium  carbonate (TUMS EX) 750 MG chewable tablet Chew 1 tablet by mouth daily as needed for heartburn.     calcium -vitamin D  (OSCAL WITH D) 500-5 MG-MCG tablet Take 1 tablet by mouth 2 (two) times daily with a meal.     carvedilol  (COREG )  6.25 MG tablet Take 6.25 mg by mouth in the morning and at bedtime.     citalopram  (CELEXA ) 20 MG tablet Take 20 mg by mouth daily.     cyanocobalamin  1000 MCG tablet Take 1,000 mcg by mouth daily.      docusate sodium  (COLACE) 100 MG capsule Take 100 mg by mouth in the morning and at bedtime.     estradiol (ESTRACE) 0.01 % CREA vaginal cream Place 1 g vaginally daily.     ferrous sulfate  325 (65 FE) MG tablet Take 325 mg by mouth every Monday, Tuesday, Wednesday, Thursday, and Friday.     furosemide  (LASIX ) 20 MG tablet Take 1 tablet (20 mg total) by mouth daily as needed for fluid or edema. take once daily as needed for weight gain of 2-3 lbs overnight or 5 lbs in 1 week. (Patient taking differently: Take 20 mg by mouth daily as needed for fluid or edema (for weight gain of 2-3 lbs overnight or 5 lbs in 1 week).) 90 tablet 3   gabapentin  (NEURONTIN ) 300 MG capsule Take 1 capsule (300 mg total) by mouth 2 (two) times daily. (Patient taking differently: Take 300 mg by mouth in the morning and at bedtime.)     hyoscyamine  (LEVSIN  SL) 0.125 MG SL tablet Place 1 tablet (0.125 mg total) under the tongue 3 (three) times daily before meals. 30 tablet 0   insulin  aspart (NOVOLOG ) 100 UNIT/ML injection Inject 0-15 Units into the skin 3 (three) times daily with meals. 0-15 Units, Subcutaneous, 3 times daily with meals, First dose on Mon 11/03/23 at 1700 Correction coverage: Moderate (average weight, post-op) CBG < 70: Implement Hypoglycemia Standing Orders and refer to Hypoglycemia Standing Orders sidebar report CBG 70 - 120: 0 units CBG 121 - 150: 2 units CBG 151 - 200: 3 units CBG 201 - 250: 5 units CBG 251 - 300: 8 units CBG 301 - 350: 11 units CBG 351 - 400: 15 units (Patient taking differently: Inject 0-5 Units into the skin See admin instructions. Inject 0-5 units into the skin before meals and at bedtime, per sliding scale: BGL <60 = give glucose and CALL MD; 0-150 = give nothing; 151-200 = 1 unit; 201-250 = 2 units; 251-300 = 3 units; 301-350 = 4 units; 351-400 = 5 units; >400 = 5 units and CALL MD)     ipratropium-albuterol  (DUONEB) 0.5-2.5 (3) MG/3ML SOLN Take 3 mLs by nebulization every 6 (six) hours  as needed (for wheezing or shortness of breath).     LANTUS  SOLOSTAR 100 UNIT/ML Solostar Pen Inject 5 Units into the skin at bedtime.     liver oil-zinc  oxide (DESITIN) 40 % ointment Apply topically 2 (two) times daily.     Magnesium  Oxide -Mg Supplement 250 MG TABS Take 250 mg by mouth daily.     melatonin 3 MG TABS tablet Take 3 mg by mouth at bedtime.     methocarbamol  (ROBAXIN ) 500 MG tablet Take 500 mg by mouth every 8 (eight) hours as needed for muscle spasms.     mirtazapine  (REMERON ) 7.5 MG tablet Take 7.5 mg by mouth at bedtime.     oxybutynin  (DITROPAN -XL) 10 MG 24 hr tablet Take 10 mg by mouth daily.     oxyCODONE  (OXY IR/ROXICODONE ) 5 MG immediate release tablet Take 1 tablet (5 mg total) by mouth every 6 (six) hours as needed for severe pain (pain score 7-10). (Patient taking differently: Take 5 mg by mouth every 6 (six) hours as needed (  for pain).) 10 tablet 0   OXYGEN  Inhale 2 L/min into the lungs continuous.     pantoprazole  (PROTONIX ) 40 MG tablet Take 1 tablet (40 mg total) by mouth 2 (two) times daily. (Patient taking differently: Take 40 mg by mouth in the morning and at bedtime.)     polyethylene glycol (MIRALAX  / GLYCOLAX ) 17 g packet Take 17 g by mouth daily as needed for moderate constipation. (Patient taking differently: Take 17 g by mouth daily as needed for moderate constipation (to be mixed into 4-6 ounces of a beverage or juice).)     REFRESH TEARS 0.5 % SOLN Place 1 drop into both eyes every 4 (four) hours as needed (for dryness).     saccharomyces boulardii (FLORASTOR) 250 MG capsule Take 250 mg by mouth daily.     senna (SENOKOT) 8.6 MG tablet Take 2 tablets by mouth at bedtime.     No current facility-administered medications on file prior to visit.  [2]  Allergies Allergen Reactions   Latex Other (See Comments), Rash and Hives    tears skin   Codeine Other (See Comments)    Abnormal behavior   Morphine  And Codeine Other (See Comments)    Affects BP and  blood sugar   Cyclobenzaprine Other (See Comments)    MADE PT SICK- pt unsure if med was cyclobenzaprine or methocarbamol    Grapefruit Concentrate Other (See Comments)    Allergic, per [facility]   Jardiance [Empagliflozin] Other (See Comments)    Allergic, per [facility]   "

## 2024-02-05 ENCOUNTER — Encounter: Payer: Self-pay | Admitting: Adult Health

## 2024-02-05 ENCOUNTER — Ambulatory Visit (INDEPENDENT_AMBULATORY_CARE_PROVIDER_SITE_OTHER): Admitting: Adult Health

## 2024-02-05 VITALS — BP 175/89 | HR 78

## 2024-02-05 DIAGNOSIS — I639 Cerebral infarction, unspecified: Secondary | ICD-10-CM | POA: Diagnosis not present

## 2024-02-05 DIAGNOSIS — Z09 Encounter for follow-up examination after completed treatment for conditions other than malignant neoplasm: Secondary | ICD-10-CM

## 2024-02-05 DIAGNOSIS — I4819 Other persistent atrial fibrillation: Secondary | ICD-10-CM | POA: Diagnosis not present

## 2024-02-05 NOTE — Progress Notes (Signed)
 I agree with the above plan

## 2024-02-05 NOTE — Patient Instructions (Addendum)
 Continue Eliquis  for secondary stroke prevention  Continue to follow up with PCP regarding blood pressure and cholesterol management  Maintain strict control of hypertension with blood pressure goal below 130/90, diabetes with hemoglobin A1c goal below 7.0 % and cholesterol with LDL cholesterol (bad cholesterol) goal below 70 mg/dL.   Signs of a Stroke? Follow the BEFAST method:  Balance Watch for a sudden loss of balance, trouble with coordination or vertigo Eyes Is there a sudden loss of vision in one or both eyes? Or double vision?  Face: Ask the person to smile. Does one side of the face droop or is it numb?  Arms: Ask the person to raise both arms. Does one arm drift downward? Is there weakness or numbness of a leg? Speech: Ask the person to repeat a simple phrase. Does the speech sound slurred/strange? Is the person confused ? Time: If you observe any of these signs, call 911.         Thank you for coming to see us  at [facility]. I hope we have been able to provide you high quality care today.  You may receive a patient satisfaction survey over the next few weeks. We would appreciate your feedback and comments so that we may continue to improve ourselves and the health of our patients.

## 2024-02-08 ENCOUNTER — Ambulatory Visit

## 2024-02-11 ENCOUNTER — Telehealth: Payer: Self-pay | Admitting: Cardiovascular Disease

## 2024-02-11 NOTE — Telephone Encounter (Signed)
 error

## 2024-02-13 ENCOUNTER — Ambulatory Visit: Admitting: Gastroenterology

## 2024-03-02 ENCOUNTER — Ambulatory Visit: Admitting: Nurse Practitioner

## 2024-03-10 ENCOUNTER — Ambulatory Visit

## 2024-04-06 ENCOUNTER — Ambulatory Visit: Admitting: Podiatry

## 2024-04-10 ENCOUNTER — Ambulatory Visit

## 2024-05-11 ENCOUNTER — Ambulatory Visit

## 2024-05-20 ENCOUNTER — Ambulatory Visit (HOSPITAL_COMMUNITY): Admitting: Internal Medicine

## 2024-06-11 ENCOUNTER — Ambulatory Visit

## 2024-07-12 ENCOUNTER — Ambulatory Visit

## 2024-08-12 ENCOUNTER — Ambulatory Visit

## 2024-09-12 ENCOUNTER — Ambulatory Visit
